# Patient Record
Sex: Female | Born: 1945 | Race: Black or African American | Hispanic: No | Marital: Married | Smoking: Former smoker
Health system: Southern US, Community
[De-identification: ages and names within clinical notes are randomized; demographics above are authoritative.]

## PROBLEM LIST (undated history)

## (undated) DIAGNOSIS — D219 Benign neoplasm of connective and other soft tissue, unspecified: Secondary | ICD-10-CM

## (undated) DIAGNOSIS — J45909 Unspecified asthma, uncomplicated: Secondary | ICD-10-CM

## (undated) DIAGNOSIS — G4733 Obstructive sleep apnea (adult) (pediatric): Secondary | ICD-10-CM

## (undated) DIAGNOSIS — IMO0002 Reserved for concepts with insufficient information to code with codable children: Secondary | ICD-10-CM

## (undated) DIAGNOSIS — K5790 Diverticulosis of intestine, part unspecified, without perforation or abscess without bleeding: Secondary | ICD-10-CM

## (undated) DIAGNOSIS — R519 Headache, unspecified: Secondary | ICD-10-CM

## (undated) DIAGNOSIS — N390 Urinary tract infection, site not specified: Secondary | ICD-10-CM

## (undated) DIAGNOSIS — R51 Headache: Secondary | ICD-10-CM

## (undated) DIAGNOSIS — R911 Solitary pulmonary nodule: Secondary | ICD-10-CM

## (undated) DIAGNOSIS — Z8619 Personal history of other infectious and parasitic diseases: Secondary | ICD-10-CM

## (undated) DIAGNOSIS — K469 Unspecified abdominal hernia without obstruction or gangrene: Secondary | ICD-10-CM

## (undated) DIAGNOSIS — H9190 Unspecified hearing loss, unspecified ear: Secondary | ICD-10-CM

## (undated) DIAGNOSIS — K7581 Nonalcoholic steatohepatitis (NASH): Secondary | ICD-10-CM

## (undated) DIAGNOSIS — K7689 Other specified diseases of liver: Secondary | ICD-10-CM

## (undated) DIAGNOSIS — M1990 Unspecified osteoarthritis, unspecified site: Secondary | ICD-10-CM

## (undated) DIAGNOSIS — I10 Essential (primary) hypertension: Secondary | ICD-10-CM

## (undated) DIAGNOSIS — E7800 Pure hypercholesterolemia, unspecified: Secondary | ICD-10-CM

## (undated) DIAGNOSIS — T4145XA Adverse effect of unspecified anesthetic, initial encounter: Secondary | ICD-10-CM

## (undated) DIAGNOSIS — T7840XA Allergy, unspecified, initial encounter: Secondary | ICD-10-CM

## (undated) DIAGNOSIS — R32 Unspecified urinary incontinence: Secondary | ICD-10-CM

## (undated) DIAGNOSIS — E119 Type 2 diabetes mellitus without complications: Secondary | ICD-10-CM

## (undated) DIAGNOSIS — R700 Elevated erythrocyte sedimentation rate: Secondary | ICD-10-CM

## (undated) DIAGNOSIS — E785 Hyperlipidemia, unspecified: Secondary | ICD-10-CM

## (undated) DIAGNOSIS — E042 Nontoxic multinodular goiter: Secondary | ICD-10-CM

## (undated) DIAGNOSIS — R739 Hyperglycemia, unspecified: Secondary | ICD-10-CM

## (undated) DIAGNOSIS — D649 Anemia, unspecified: Secondary | ICD-10-CM

## (undated) DIAGNOSIS — M549 Dorsalgia, unspecified: Secondary | ICD-10-CM

## (undated) DIAGNOSIS — R7303 Prediabetes: Secondary | ICD-10-CM

## (undated) DIAGNOSIS — I2510 Atherosclerotic heart disease of native coronary artery without angina pectoris: Secondary | ICD-10-CM

## (undated) DIAGNOSIS — R87619 Unspecified abnormal cytological findings in specimens from cervix uteri: Secondary | ICD-10-CM

## (undated) DIAGNOSIS — G43909 Migraine, unspecified, not intractable, without status migrainosus: Secondary | ICD-10-CM

## (undated) DIAGNOSIS — T8859XA Other complications of anesthesia, initial encounter: Secondary | ICD-10-CM

## (undated) DIAGNOSIS — Z780 Asymptomatic menopausal state: Secondary | ICD-10-CM

## (undated) DIAGNOSIS — K449 Diaphragmatic hernia without obstruction or gangrene: Secondary | ICD-10-CM

## (undated) DIAGNOSIS — J309 Allergic rhinitis, unspecified: Secondary | ICD-10-CM

## (undated) DIAGNOSIS — G4730 Sleep apnea, unspecified: Secondary | ICD-10-CM

## (undated) DIAGNOSIS — K219 Gastro-esophageal reflux disease without esophagitis: Secondary | ICD-10-CM

## (undated) DIAGNOSIS — Z9889 Other specified postprocedural states: Secondary | ICD-10-CM

## (undated) DIAGNOSIS — Z9989 Dependence on other enabling machines and devices: Secondary | ICD-10-CM

## (undated) DIAGNOSIS — G4734 Idiopathic sleep related nonobstructive alveolar hypoventilation: Secondary | ICD-10-CM

## (undated) DIAGNOSIS — I509 Heart failure, unspecified: Secondary | ICD-10-CM

## (undated) DIAGNOSIS — E669 Obesity, unspecified: Secondary | ICD-10-CM

## (undated) HISTORY — DX: Migraine, unspecified, not intractable, without status migrainosus: G43.909

## (undated) HISTORY — DX: Unspecified abdominal hernia without obstruction or gangrene: K46.9

## (undated) HISTORY — DX: Obstructive sleep apnea (adult) (pediatric): G47.33

## (undated) HISTORY — DX: Adverse effect of unspecified anesthetic, initial encounter: T41.45XA

## (undated) HISTORY — DX: Diverticulosis of intestine, part unspecified, without perforation or abscess without bleeding: K57.90

## (undated) HISTORY — DX: Hyperlipidemia, unspecified: E78.5

## (undated) HISTORY — DX: Obesity, unspecified: E66.9

## (undated) HISTORY — DX: Unspecified osteoarthritis, unspecified site: M19.90

## (undated) HISTORY — DX: Hyperglycemia, unspecified: R73.9

## (undated) HISTORY — DX: Solitary pulmonary nodule: R91.1

## (undated) HISTORY — DX: Essential (primary) hypertension: I10

## (undated) HISTORY — DX: Allergy, unspecified, initial encounter: T78.40XA

## (undated) HISTORY — DX: Sleep apnea, unspecified: G47.30

## (undated) HISTORY — DX: Obstructive sleep apnea (adult) (pediatric): Z99.89

## (undated) HISTORY — DX: Nonalcoholic steatohepatitis (NASH): K75.81

## (undated) HISTORY — DX: Reserved for concepts with insufficient information to code with codable children: IMO0002

## (undated) HISTORY — DX: Type 2 diabetes mellitus without complications: E11.9

## (undated) HISTORY — DX: Unspecified urinary incontinence: R32

## (undated) HISTORY — DX: Headache: R51

## (undated) HISTORY — DX: Other complications of anesthesia, initial encounter: T88.59XA

## (undated) HISTORY — DX: Unspecified abnormal cytological findings in specimens from cervix uteri: R87.619

## (undated) HISTORY — DX: Allergic rhinitis, unspecified: J30.9

## (undated) HISTORY — DX: Diaphragmatic hernia without obstruction or gangrene: K44.9

## (undated) HISTORY — DX: Other specified postprocedural states: Z98.890

## (undated) HISTORY — DX: Idiopathic sleep related nonobstructive alveolar hypoventilation: G47.34

## (undated) HISTORY — DX: Other specified diseases of liver: K76.89

## (undated) HISTORY — DX: Dorsalgia, unspecified: M54.9

## (undated) HISTORY — PX: FOOT SURGERY: SHX648

## (undated) HISTORY — PX: DILATION AND CURETTAGE OF UTERUS: SHX78

## (undated) HISTORY — DX: Benign neoplasm of connective and other soft tissue, unspecified: D21.9

## (undated) HISTORY — DX: Urinary tract infection, site not specified: N39.0

## (undated) HISTORY — DX: Asymptomatic menopausal state: Z78.0

## (undated) HISTORY — DX: Pure hypercholesterolemia, unspecified: E78.00

## (undated) HISTORY — DX: Headache, unspecified: R51.9

## (undated) HISTORY — DX: Personal history of other infectious and parasitic diseases: Z86.19

## (undated) HISTORY — PX: CATARACT EXTRACTION, BILATERAL: SHX1313

## (undated) HISTORY — DX: Nontoxic multinodular goiter: E04.2

## (undated) HISTORY — PX: OTHER SURGICAL HISTORY: SHX169

## (undated) HISTORY — DX: Atherosclerotic heart disease of native coronary artery without angina pectoris: I25.10

## (undated) HISTORY — DX: Elevated erythrocyte sedimentation rate: R70.0

## (undated) HISTORY — DX: Gastro-esophageal reflux disease without esophagitis: K21.9

## (undated) HISTORY — PX: WISDOM TOOTH EXTRACTION: SHX21

## (undated) HISTORY — DX: Anemia, unspecified: D64.9

## (undated) HISTORY — DX: Unspecified hearing loss, unspecified ear: H91.90

## (undated) LAB — HM DIABETES EYE EXAM

---

## 1982-03-02 HISTORY — PX: BREAST SURGERY: SHX581

## 2001-08-04 ENCOUNTER — Encounter: Admission: RE | Admit: 2001-08-04 | Discharge: 2001-08-04 | Payer: Self-pay | Admitting: Endocrinology

## 2001-08-04 ENCOUNTER — Encounter: Payer: Self-pay | Admitting: Endocrinology

## 2001-10-17 ENCOUNTER — Ambulatory Visit (HOSPITAL_COMMUNITY): Admission: RE | Admit: 2001-10-17 | Discharge: 2001-10-17 | Payer: Self-pay | Admitting: Endocrinology

## 2001-10-17 ENCOUNTER — Encounter: Payer: Self-pay | Admitting: Endocrinology

## 2001-10-17 ENCOUNTER — Encounter (INDEPENDENT_AMBULATORY_CARE_PROVIDER_SITE_OTHER): Payer: Self-pay | Admitting: *Deleted

## 2002-02-02 ENCOUNTER — Other Ambulatory Visit: Admission: RE | Admit: 2002-02-02 | Discharge: 2002-02-02 | Payer: Self-pay | Admitting: Obstetrics and Gynecology

## 2003-07-25 ENCOUNTER — Ambulatory Visit (HOSPITAL_COMMUNITY): Admission: RE | Admit: 2003-07-25 | Discharge: 2003-07-25 | Payer: Self-pay | Admitting: Family Medicine

## 2003-08-23 ENCOUNTER — Ambulatory Visit (HOSPITAL_COMMUNITY): Admission: RE | Admit: 2003-08-23 | Discharge: 2003-08-23 | Payer: Self-pay | Admitting: Endocrinology

## 2003-09-14 ENCOUNTER — Ambulatory Visit (HOSPITAL_COMMUNITY): Admission: RE | Admit: 2003-09-14 | Discharge: 2003-09-14 | Payer: Self-pay | Admitting: Endocrinology

## 2003-09-14 ENCOUNTER — Encounter (INDEPENDENT_AMBULATORY_CARE_PROVIDER_SITE_OTHER): Payer: Self-pay | Admitting: *Deleted

## 2004-03-26 ENCOUNTER — Other Ambulatory Visit: Admission: RE | Admit: 2004-03-26 | Discharge: 2004-03-26 | Payer: Self-pay | Admitting: Obstetrics and Gynecology

## 2004-08-18 ENCOUNTER — Ambulatory Visit: Payer: Self-pay | Admitting: Endocrinology

## 2004-08-25 ENCOUNTER — Ambulatory Visit: Payer: Self-pay | Admitting: Endocrinology

## 2004-08-29 ENCOUNTER — Ambulatory Visit (HOSPITAL_COMMUNITY): Admission: RE | Admit: 2004-08-29 | Discharge: 2004-08-29 | Payer: Self-pay | Admitting: Endocrinology

## 2005-01-21 ENCOUNTER — Ambulatory Visit: Payer: Self-pay | Admitting: Endocrinology

## 2005-01-23 ENCOUNTER — Ambulatory Visit: Payer: Self-pay | Admitting: Endocrinology

## 2005-01-27 ENCOUNTER — Ambulatory Visit: Payer: Self-pay | Admitting: Endocrinology

## 2005-02-24 ENCOUNTER — Ambulatory Visit: Payer: Self-pay | Admitting: Endocrinology

## 2005-03-17 ENCOUNTER — Ambulatory Visit: Payer: Self-pay | Admitting: Endocrinology

## 2005-04-22 ENCOUNTER — Other Ambulatory Visit: Admission: RE | Admit: 2005-04-22 | Discharge: 2005-04-22 | Payer: Self-pay | Admitting: Obstetrics and Gynecology

## 2005-04-28 ENCOUNTER — Ambulatory Visit: Payer: Self-pay | Admitting: Gastroenterology

## 2005-05-11 ENCOUNTER — Ambulatory Visit: Payer: Self-pay | Admitting: Gastroenterology

## 2005-05-11 ENCOUNTER — Encounter (INDEPENDENT_AMBULATORY_CARE_PROVIDER_SITE_OTHER): Payer: Self-pay | Admitting: Specialist

## 2005-05-11 LAB — HM COLONOSCOPY

## 2005-08-26 ENCOUNTER — Ambulatory Visit: Payer: Self-pay | Admitting: Endocrinology

## 2005-08-30 HISTORY — PX: OTHER SURGICAL HISTORY: SHX169

## 2005-08-31 ENCOUNTER — Ambulatory Visit: Payer: Self-pay | Admitting: Endocrinology

## 2005-09-03 ENCOUNTER — Ambulatory Visit: Payer: Self-pay | Admitting: Family Medicine

## 2005-10-11 ENCOUNTER — Emergency Department (HOSPITAL_COMMUNITY): Admission: EM | Admit: 2005-10-11 | Discharge: 2005-10-11 | Payer: Self-pay | Admitting: Emergency Medicine

## 2005-10-13 ENCOUNTER — Ambulatory Visit: Payer: Self-pay | Admitting: Endocrinology

## 2005-10-21 ENCOUNTER — Ambulatory Visit: Payer: Self-pay

## 2005-10-21 HISTORY — PX: OTHER SURGICAL HISTORY: SHX169

## 2005-11-17 ENCOUNTER — Ambulatory Visit: Payer: Self-pay | Admitting: Gastroenterology

## 2005-11-19 ENCOUNTER — Ambulatory Visit: Payer: Self-pay | Admitting: Gastroenterology

## 2005-12-08 ENCOUNTER — Encounter (INDEPENDENT_AMBULATORY_CARE_PROVIDER_SITE_OTHER): Payer: Self-pay | Admitting: *Deleted

## 2005-12-08 ENCOUNTER — Ambulatory Visit: Payer: Self-pay | Admitting: Gastroenterology

## 2005-12-08 HISTORY — PX: ESOPHAGOGASTRODUODENOSCOPY: SHX1529

## 2005-12-14 ENCOUNTER — Ambulatory Visit (HOSPITAL_COMMUNITY): Admission: RE | Admit: 2005-12-14 | Discharge: 2005-12-14 | Payer: Self-pay | Admitting: Gastroenterology

## 2006-06-03 ENCOUNTER — Ambulatory Visit: Payer: Self-pay | Admitting: Endocrinology

## 2006-10-06 ENCOUNTER — Ambulatory Visit: Payer: Self-pay | Admitting: Endocrinology

## 2006-10-06 LAB — CONVERTED CEMR LAB
ALT: 14 units/L (ref 0–35)
BUN: 11 mg/dL (ref 6–23)
CO2: 29 meq/L (ref 19–32)
Calcium: 9.2 mg/dL (ref 8.4–10.5)
Cholesterol: 163 mg/dL (ref 0–200)
Creatinine, Ser: 0.8 mg/dL (ref 0.4–1.2)
Eosinophils Absolute: 0.1 10*3/uL (ref 0.0–0.6)
GFR calc Af Amer: 94 mL/min
GFR calc non Af Amer: 78 mL/min
Glucose, Bld: 105 mg/dL — ABNORMAL HIGH (ref 70–99)
HDL: 50.5 mg/dL (ref >39.0)
Hemoglobin, Urine: NEGATIVE
Hgb A1c MFr Bld: 6.2 % — ABNORMAL HIGH (ref 4.6–6.0)
Ketones, ur: NEGATIVE mg/dL
MCHC: 33.4 g/dL (ref 30.0–36.0)
MCV: 90.6 fL (ref 78.0–100.0)
Monocytes Absolute: 0.4 10*3/uL (ref 0.2–0.7)
Monocytes Relative: 6.9 % (ref 3.0–11.0)
Neutro Abs: 3.2 10*3/uL (ref 1.4–7.7)
Potassium: 3.7 meq/L (ref 3.5–5.1)
RBC: 4.05 M/uL (ref 3.87–5.11)
RDW: 16 % — ABNORMAL HIGH (ref 11.5–14.6)
Sodium: 140 meq/L (ref 135–145)
Total CHOL/HDL Ratio: 3.2
Triglycerides: 64 mg/dL (ref 0–149)
Urobilinogen, UA: 0.2 (ref 0.0–1.0)
VLDL: 13 mg/dL (ref 0–40)
WBC: 5.2 10*3/uL (ref 4.5–10.5)
pH: 8 (ref 5.0–8.0)

## 2006-10-15 ENCOUNTER — Ambulatory Visit: Payer: Self-pay | Admitting: Endocrinology

## 2006-10-15 HISTORY — PX: ELECTROCARDIOGRAM: SHX264

## 2006-10-22 ENCOUNTER — Encounter: Payer: Self-pay | Admitting: Endocrinology

## 2006-10-22 DIAGNOSIS — M1990 Unspecified osteoarthritis, unspecified site: Secondary | ICD-10-CM

## 2006-10-22 DIAGNOSIS — F411 Generalized anxiety disorder: Secondary | ICD-10-CM | POA: Insufficient documentation

## 2006-10-22 DIAGNOSIS — J309 Allergic rhinitis, unspecified: Secondary | ICD-10-CM

## 2006-10-22 DIAGNOSIS — I10 Essential (primary) hypertension: Secondary | ICD-10-CM | POA: Insufficient documentation

## 2006-10-22 DIAGNOSIS — E1159 Type 2 diabetes mellitus with other circulatory complications: Secondary | ICD-10-CM | POA: Insufficient documentation

## 2006-10-22 HISTORY — DX: Essential (primary) hypertension: I10

## 2006-10-22 HISTORY — DX: Allergic rhinitis, unspecified: J30.9

## 2006-10-22 HISTORY — DX: Unspecified osteoarthritis, unspecified site: M19.90

## 2007-01-13 ENCOUNTER — Ambulatory Visit: Payer: Self-pay | Admitting: Endocrinology

## 2007-01-13 DIAGNOSIS — E119 Type 2 diabetes mellitus without complications: Secondary | ICD-10-CM

## 2007-01-13 DIAGNOSIS — E7800 Pure hypercholesterolemia, unspecified: Secondary | ICD-10-CM

## 2007-01-13 HISTORY — DX: Pure hypercholesterolemia, unspecified: E78.00

## 2007-01-13 HISTORY — DX: Type 2 diabetes mellitus without complications: E11.9

## 2007-01-13 LAB — CONVERTED CEMR LAB
ALT: 15 units/L (ref 0–35)
AST: 17 units/L (ref 0–37)
Albumin: 3.3 g/dL — ABNORMAL LOW (ref 3.5–5.2)
Bilirubin, Direct: 0.1 mg/dL (ref 0.0–0.3)
LDL Cholesterol: 77 mg/dL (ref 0–99)
Total Bilirubin: 0.5 mg/dL (ref 0.3–1.2)
Total CHOL/HDL Ratio: 2.8
Total Protein: 7.6 g/dL (ref 6.0–8.3)

## 2007-01-21 ENCOUNTER — Ambulatory Visit: Payer: Self-pay | Admitting: Endocrinology

## 2007-04-21 ENCOUNTER — Ambulatory Visit: Payer: Self-pay | Admitting: Endocrinology

## 2007-07-04 ENCOUNTER — Telehealth (INDEPENDENT_AMBULATORY_CARE_PROVIDER_SITE_OTHER): Payer: Self-pay | Admitting: *Deleted

## 2007-07-20 ENCOUNTER — Ambulatory Visit: Payer: Self-pay | Admitting: Endocrinology

## 2007-07-20 DIAGNOSIS — R51 Headache: Secondary | ICD-10-CM

## 2007-07-20 DIAGNOSIS — E042 Nontoxic multinodular goiter: Secondary | ICD-10-CM

## 2007-07-20 DIAGNOSIS — K7581 Nonalcoholic steatohepatitis (NASH): Secondary | ICD-10-CM

## 2007-07-20 DIAGNOSIS — K219 Gastro-esophageal reflux disease without esophagitis: Secondary | ICD-10-CM

## 2007-07-20 DIAGNOSIS — R519 Headache, unspecified: Secondary | ICD-10-CM | POA: Insufficient documentation

## 2007-07-20 DIAGNOSIS — K7689 Other specified diseases of liver: Secondary | ICD-10-CM

## 2007-07-20 HISTORY — DX: Other specified diseases of liver: K76.89

## 2007-07-20 HISTORY — DX: Headache: R51

## 2007-07-20 HISTORY — DX: Gastro-esophageal reflux disease without esophagitis: K21.9

## 2007-07-20 HISTORY — DX: Nontoxic multinodular goiter: E04.2

## 2007-07-20 LAB — CONVERTED CEMR LAB: Hgb A1c MFr Bld: 6.6 % — ABNORMAL HIGH (ref 4.6–6.0)

## 2007-08-03 ENCOUNTER — Encounter (INDEPENDENT_AMBULATORY_CARE_PROVIDER_SITE_OTHER): Payer: Self-pay | Admitting: *Deleted

## 2007-08-19 ENCOUNTER — Encounter: Admission: RE | Admit: 2007-08-19 | Discharge: 2007-08-19 | Payer: Self-pay | Admitting: Endocrinology

## 2007-09-21 ENCOUNTER — Encounter: Payer: Self-pay | Admitting: Endocrinology

## 2007-11-11 ENCOUNTER — Ambulatory Visit: Payer: Self-pay | Admitting: Endocrinology

## 2007-11-12 LAB — CONVERTED CEMR LAB
ALT: 11 units/L (ref 0–35)
AST: 17 units/L (ref 0–37)
Albumin: 3.6 g/dL (ref 3.5–5.2)
Alkaline Phosphatase: 58 units/L (ref 39–117)
BUN: 14 mg/dL (ref 6–23)
Basophils Absolute: 0 10*3/uL (ref 0.0–0.1)
Bilirubin Urine: NEGATIVE
CO2: 29 meq/L (ref 19–32)
Chloride: 107 meq/L (ref 96–112)
Creatinine, Ser: 0.8 mg/dL (ref 0.4–1.2)
Eosinophils Absolute: 0.1 10*3/uL (ref 0.0–0.7)
GFR calc Af Amer: 94 mL/min
HCT: 38.4 % (ref 36.0–46.0)
HDL: 55.9 mg/dL (ref >39.0)
Hemoglobin: 12.8 g/dL (ref 12.0–15.0)
Hgb A1c MFr Bld: 6.3 % — ABNORMAL HIGH (ref 4.6–6.0)
Leukocytes, UA: NEGATIVE
Lymphocytes Relative: 33.5 % (ref 12.0–46.0)
Monocytes Absolute: 0.4 10*3/uL (ref 0.1–1.0)
Neutro Abs: 2.7 10*3/uL (ref 1.4–7.7)
Potassium: 3.8 meq/L (ref 3.5–5.1)
RBC: 4.25 M/uL (ref 3.87–5.11)
Total Protein: 7.8 g/dL (ref 6.0–8.3)
Triglycerides: 50 mg/dL (ref 0–149)
Urine Glucose: NEGATIVE mg/dL
Urobilinogen, UA: 0.2 (ref 0.0–1.0)
VLDL: 10 mg/dL (ref 0–40)
WBC: 4.8 10*3/uL (ref 4.5–10.5)
pH: 7 (ref 5.0–8.0)

## 2007-11-22 ENCOUNTER — Ambulatory Visit: Payer: Self-pay | Admitting: Endocrinology

## 2007-11-22 DIAGNOSIS — H9319 Tinnitus, unspecified ear: Secondary | ICD-10-CM | POA: Insufficient documentation

## 2007-11-22 DIAGNOSIS — H9190 Unspecified hearing loss, unspecified ear: Secondary | ICD-10-CM

## 2007-11-22 DIAGNOSIS — Z780 Asymptomatic menopausal state: Secondary | ICD-10-CM

## 2007-11-22 DIAGNOSIS — G8929 Other chronic pain: Secondary | ICD-10-CM | POA: Insufficient documentation

## 2007-11-22 DIAGNOSIS — M25569 Pain in unspecified knee: Secondary | ICD-10-CM

## 2007-11-22 HISTORY — DX: Asymptomatic menopausal state: Z78.0

## 2007-11-22 HISTORY — DX: Unspecified hearing loss, unspecified ear: H91.90

## 2007-11-29 ENCOUNTER — Encounter: Payer: Self-pay | Admitting: Endocrinology

## 2007-11-29 ENCOUNTER — Ambulatory Visit: Payer: Self-pay | Admitting: Family Medicine

## 2007-12-13 ENCOUNTER — Encounter: Payer: Self-pay | Admitting: Endocrinology

## 2007-12-15 ENCOUNTER — Encounter: Payer: Self-pay | Admitting: Endocrinology

## 2007-12-21 ENCOUNTER — Encounter: Payer: Self-pay | Admitting: Endocrinology

## 2008-03-02 DIAGNOSIS — T8859XA Other complications of anesthesia, initial encounter: Secondary | ICD-10-CM

## 2008-03-02 HISTORY — DX: Other complications of anesthesia, initial encounter: T88.59XA

## 2008-03-13 ENCOUNTER — Encounter: Payer: Self-pay | Admitting: Endocrinology

## 2008-06-28 ENCOUNTER — Encounter: Payer: Self-pay | Admitting: Endocrinology

## 2008-09-04 ENCOUNTER — Ambulatory Visit: Payer: Self-pay | Admitting: Endocrinology

## 2008-09-04 LAB — CONVERTED CEMR LAB
Cholesterol, target level: 200 mg/dL
HDL goal, serum: 40 mg/dL
TSH: 1.18 microintl units/mL (ref 0.35–5.50)

## 2008-12-04 ENCOUNTER — Ambulatory Visit: Payer: Self-pay | Admitting: Endocrinology

## 2008-12-04 LAB — CONVERTED CEMR LAB
ALT: 18 units/L (ref 0–35)
AST: 18 units/L (ref 0–37)
Basophils Absolute: 0 10*3/uL (ref 0.0–0.1)
Bilirubin, Direct: 0.1 mg/dL (ref 0.0–0.3)
Calcium: 8.8 mg/dL (ref 8.4–10.5)
Cholesterol: 165 mg/dL (ref 0–200)
Creatinine, Ser: 0.8 mg/dL (ref 0.4–1.2)
GFR calc non Af Amer: 93.44 mL/min (ref >60)
Glucose, Bld: 94 mg/dL (ref 70–99)
HDL: 53.7 mg/dL (ref >39.00)
Hemoglobin: 12.6 g/dL (ref 12.0–15.0)
Ketones, ur: NEGATIVE mg/dL
MCHC: 33.1 g/dL (ref 30.0–36.0)
MCV: 87.8 fL (ref 78.0–100.0)
Monocytes Absolute: 0.4 10*3/uL (ref 0.1–1.0)
Monocytes Relative: 7.2 % (ref 3.0–12.0)
Nitrite: NEGATIVE
RBC: 4.32 M/uL (ref 3.87–5.11)
RDW: 16 % — ABNORMAL HIGH (ref 11.5–14.6)
Sodium: 141 meq/L (ref 135–145)
Total CHOL/HDL Ratio: 3
Total Protein, Urine: NEGATIVE mg/dL
Total Protein: 7.5 g/dL (ref 6.0–8.3)
Urine Glucose: NEGATIVE mg/dL
VLDL: 14.8 mg/dL (ref 0.0–40.0)

## 2008-12-18 ENCOUNTER — Ambulatory Visit: Payer: Self-pay | Admitting: Endocrinology

## 2008-12-18 DIAGNOSIS — D649 Anemia, unspecified: Secondary | ICD-10-CM

## 2008-12-18 DIAGNOSIS — R609 Edema, unspecified: Secondary | ICD-10-CM | POA: Insufficient documentation

## 2008-12-18 HISTORY — DX: Anemia, unspecified: D64.9

## 2009-03-14 ENCOUNTER — Telehealth (INDEPENDENT_AMBULATORY_CARE_PROVIDER_SITE_OTHER): Payer: Self-pay | Admitting: *Deleted

## 2009-03-22 ENCOUNTER — Encounter: Payer: Self-pay | Admitting: Endocrinology

## 2009-07-02 ENCOUNTER — Telehealth: Payer: Self-pay | Admitting: Endocrinology

## 2009-07-04 ENCOUNTER — Ambulatory Visit: Payer: Self-pay | Admitting: Endocrinology

## 2009-07-05 LAB — CONVERTED CEMR LAB
Eosinophils Absolute: 0.1 10*3/uL (ref 0.0–0.7)
Eosinophils Relative: 2.6 % (ref 0.0–5.0)
HCT: 37.9 % (ref 36.0–46.0)
Hemoglobin: 12.7 g/dL (ref 12.0–15.0)
Lymphocytes Relative: 27 % (ref 12.0–46.0)
MCHC: 33.4 g/dL (ref 30.0–36.0)
MCV: 87.5 fL (ref 78.0–100.0)
Monocytes Relative: 8.2 % (ref 3.0–12.0)
Platelets: 202 10*3/uL (ref 150.0–400.0)
RBC: 4.33 M/uL (ref 3.87–5.11)
RDW: 17 % — ABNORMAL HIGH (ref 11.5–14.6)
Saturation Ratios: 12.7 % — ABNORMAL LOW (ref 20.0–50.0)

## 2009-07-23 ENCOUNTER — Encounter: Payer: Self-pay | Admitting: Endocrinology

## 2009-07-30 ENCOUNTER — Encounter: Payer: Self-pay | Admitting: Endocrinology

## 2009-07-31 ENCOUNTER — Encounter (INDEPENDENT_AMBULATORY_CARE_PROVIDER_SITE_OTHER): Payer: Self-pay | Admitting: *Deleted

## 2009-09-04 ENCOUNTER — Telehealth: Payer: Self-pay | Admitting: Endocrinology

## 2009-09-12 ENCOUNTER — Encounter: Payer: Self-pay | Admitting: Endocrinology

## 2009-12-03 ENCOUNTER — Ambulatory Visit: Payer: Self-pay | Admitting: Endocrinology

## 2009-12-03 DIAGNOSIS — M542 Cervicalgia: Secondary | ICD-10-CM | POA: Insufficient documentation

## 2009-12-13 ENCOUNTER — Ambulatory Visit: Payer: Self-pay | Admitting: Endocrinology

## 2009-12-13 LAB — CONVERTED CEMR LAB
ALT: 12 units/L (ref 0–35)
Albumin: 3.6 g/dL (ref 3.5–5.2)
BUN: 15 mg/dL (ref 6–23)
Basophils Absolute: 0 10*3/uL (ref 0.0–0.1)
Bilirubin Urine: NEGATIVE
Bilirubin, Direct: 0.1 mg/dL (ref 0.0–0.3)
CO2: 29 meq/L (ref 19–32)
Creatinine, Ser: 0.8 mg/dL (ref 0.4–1.2)
Eosinophils Absolute: 0.1 10*3/uL (ref 0.0–0.7)
Eosinophils Relative: 2 % (ref 0.0–5.0)
Glucose, Bld: 90 mg/dL (ref 70–99)
HDL: 61 mg/dL (ref >39.00)
Hemoglobin: 12.4 g/dL (ref 12.0–15.0)
Hgb A1c MFr Bld: 6.5 % (ref 4.6–6.5)
Ketones, ur: NEGATIVE mg/dL
Leukocytes, UA: NEGATIVE
Lymphocytes Relative: 30.6 % (ref 12.0–46.0)
MCV: 90.6 fL (ref 78.0–100.0)
Monocytes Relative: 9.7 % (ref 3.0–12.0)
Neutro Abs: 2.8 10*3/uL (ref 1.4–7.7)
Neutrophils Relative %: 57.2 % (ref 43.0–77.0)
Nitrite: NEGATIVE
Potassium: 4.3 meq/L (ref 3.5–5.1)
Sodium: 142 meq/L (ref 135–145)
Total CHOL/HDL Ratio: 3
Total Protein, Urine: NEGATIVE mg/dL
Triglycerides: 57 mg/dL (ref 0.0–149.0)
Urobilinogen, UA: 0.2 (ref 0.0–1.0)
VLDL: 11.4 mg/dL (ref 0.0–40.0)

## 2009-12-19 ENCOUNTER — Ambulatory Visit: Payer: Self-pay | Admitting: Endocrinology

## 2009-12-19 LAB — CONVERTED CEMR LAB: Iron: 46 ug/dL (ref 42–145)

## 2009-12-24 ENCOUNTER — Encounter: Payer: Self-pay | Admitting: Endocrinology

## 2009-12-24 ENCOUNTER — Ambulatory Visit: Payer: Self-pay | Admitting: Internal Medicine

## 2010-03-30 LAB — CONVERTED CEMR LAB
Hgb A1c MFr Bld: 6.6 % — ABNORMAL HIGH (ref 4.6–6.5)
Microalb Creat Ratio: 3.7 mg/g (ref 0.0–30.0)
Microalb, Ur: 0.2 mg/dL (ref 0.0–1.9)
Saturation Ratios: 16 % — ABNORMAL LOW (ref 20.0–50.0)

## 2010-03-31 ENCOUNTER — Encounter: Payer: Self-pay | Admitting: Endocrinology

## 2010-04-01 NOTE — Letter (Signed)
Summary: [facility]  [facility]   Imported By: Rise Patience 08/06/2009 14:16:37  _____________________________________________________________________  External Attachment:    Type:   Image     Comment:   External Document

## 2010-04-01 NOTE — Progress Notes (Signed)
Summary: Labs?  Phone Note Call from Patient Call back at Home Phone [phone]   Caller: Patient Summary of Call: pt called requesting labs be scheduled - A1C Initial call taken by: Crissie Sickles, [place],  Jul 02, 2009 9:44 AM  Follow-up for Phone Call        a1c 250.00  iron panel 285.9, and cbc 285.9 Follow-up by: Donavan Foil MD,  Jul 02, 2009 12:53 PM  Additional Follow-up for Phone Call Additional follow up Details #1::        labs scheduled for 05/05. pt informed Additional Follow-up by: Crissie Sickles, [place],  Jul 02, 2009 2:07 PM

## 2010-04-01 NOTE — Miscellaneous (Signed)
Summary: [facility] results   Clinical Lists Changes  Observations: Added new observation of MAMMOGRAM: negative (07/30/2009 8:39)      Preventive Care Screening  Mammogram:    Date:  07/30/2009    Results:  negative

## 2010-04-01 NOTE — Letter (Signed)
Summary: [facility]  [facility]   Imported By: Bubba Hales 04/01/2009 10:48:49  _____________________________________________________________________  External Attachment:    Type:   Image     Comment:   External Document

## 2010-04-01 NOTE — Progress Notes (Signed)
Summary: [facility]  Phone Note Other Incoming   Summary of Call: Received a fax from [facility] questioning if pt has had 2 A1C tests (12/18/08 and 09/04/08), LDL test (12/04/08), and a Triglyceride test (12/04/08) in the past year? I faxed the information back to them at [phone] Initial call taken by: Gardenia Phlegm CMA,  March 14, 2009 9:17 AM

## 2010-04-01 NOTE — Progress Notes (Signed)
Summary: Rx refill req  Phone Note Call from Patient Call back at Home Phone [phone]   Caller: Patient Summary of Call: Pt called stating that Allegra D is now OTC, however she will still need Rx for this in order to get medication at her current strength and quantity. Initial call taken by: Crissie Sickles, CMA,  September 04, 2009 10:12 AM    Prescriptions: ALLEGRA-D 24 HOUR 180-240 MG TB24 (FEXOFENADINE-PSEUDOEPHEDRINE) 1 tablet by mouth once a day  #30 x 3   Entered by:   Crissie Sickles, CMA   Authorized by:   Donavan Foil MD   Signed by:   Crissie Sickles, CMA on 09/04/2009   Method used:   [facility]        [facility] [address] [phone]* (retail)       [address]       [place], [place]  [address]       Ph: [phone] or [phone]       Fax: [phone]   RxID:   [id number]

## 2010-04-01 NOTE — Assessment & Plan Note (Signed)
Summary: cpx Heather [name]  #   Vital Signs:  Patient profile:   65 year old female Height:      64 inches (162.56 cm) Weight:      255.50 pounds (116.14 kg) BMI:     44.02 O2 Sat:      91 % on Room air Temp:     97.6 degrees F (36.44 degrees C) oral Pulse rate:   83 / minute BP sitting:   126 / 72  (left arm) Cuff size:   large  Vitals Entered By: Rebeca Alert MA (December 19, 2009 1:35 PM)  O2 Flow:  Room air CC: Physical/aj Is Patient Diabetic? Yes   CC:  Physical/aj.  History of Present Illness: pt takes fe 1/day, despite not being anemic. here for regular wellness examination.  He's feeling pretty well in general, and does not drink or smoke.  Current Medications (verified): 1)  Multivitamins   Tabs (Multiple Vitamin) .... Take 1 By Mouth Qd 2)  Iron Supplement 325 (65 Fe) Mg  Tabs (Ferrous Sulfate) .... Take 1 By Mouth Qd 3)  Topamax 100 Mg  Tabs (Topiramate) .... Take 1 By Mouth Three Times A Day 4)  Klor-Con M20 20 Meq  Tbcr (Potassium Chloride Crys Cr) .... Take 1 By Mouth Qd 5)  Crestor 40 Mg  Tabs (Rosuvastatin Calcium) .... Take 1 By Mouth Qd 6)  Allegra-D 24 Hour 180-240 Mg Tb24 (Fexofenadine-Pseudoephedrine) .[name].. 1 Tablet By Mouth Once A Day 7)  Vicodin 5-500 Mg Tabs (Hydrocodone-Acetaminophen) .[name].. 1 Q4h As Needed Pain 8)  Metformin Hcl 1000 Mg Tabs (Metformin Hcl) .... Qd 9)  Lasix 20 Mg Tabs (Furosemide) .[name].. 1 Daily 10)  Calcium 600 + D .... 2 Daily 11)  Clonazepam 0.5 Mg Tabs (Clonazepam) .[name].. 1 By Mouth At Bedtime As Needed Sleep--Dr Doy Mince 12)  Glucosamine-Chondroitin  Caps (Glucosamine-Chondroit-Vit C-Mn) .[name].. 1 By Mouth Two Times A Day  Allergies (verified): 1)  ! Penicillin 2)  ! * Asprin 3)  ! Zestril 4)  Lisinopril (Lisinopril) 5)  Penicillin G Potassium (Penicillin G Potassium)  Family History: Reviewed history from 11/22/2007 and no changes required. mother had breast cancer and colon cancer  Social History: Reviewed history from 11/22/2007  and no changes required. married retired  Review of Systems  The patient denies fever, weight gain, vision loss, decreased hearing, syncope, dyspnea on exertion, prolonged cough, headaches, abdominal pain, melena, hematochezia, hematuria, suspicious skin lesions, and depression.         she has intermittent "sinus headache"  Physical Exam  General:  morbidly obese.  no distress  Head:  head: no deformity eyes: no periorbital swelling, no proptosis external nose and ears are normal mouth: no lesion seen Neck:  multinodular goiter, left > right Breasts:  sees gyn  Lungs:  Clear to auscultation bilaterally. Normal respiratory effort.  Heart:  Regular rate and rhythm without murmurs or gallops noted. Normal S1,S2.   Abdomen:  abdomen is soft, nontender.  no hepatosplenomegaly.   not distended.  no hernia  Rectal:  sees gyn  Genitalia:  sees gyn  Msk:  muscle bulk and strength are grossly normal.  no obvious joint swelling.  gait is normal and steady  Neurologic:  cn 2-12 grossly intact.   readily moves all 4's.   sensation is intact to touch on the feet  Skin:  normal texture and temp.  no rash is seen.  not diaphoretic  Cervical Nodes:  No significant adenopathy.  Psych:  Alert and  cooperative; normal mood and affect; normal attention span and concentration.   Additional Exam:  SEPARATE EVALUATION FOLLOWS--EACH PROBLEM HERE IS NEW, NOT RESPONDING TO TREATMENT, OR POSES SIGNIFICANT RISK TO THE PATIENT'S HEALTH: HISTORY OF THE PRESENT ILLNESS: the status of at least 3 ongoing medical problems is addressed today: dm: denies doe dyslipidemia: denies chest pain gerd: husband says worse recently neck pain: persists PAST MEDICAL HISTORY reviewed and up to date today REVIEW OF SYSTEMS: denies dysphagia.  she has lost a few lbs, due to her efforts PHYSICAL EXAMINATION: no deformity.  no ulcer on the feet.  feet are of normal color and temp.  no edema dorsalis pedis intact  bilat.  no carotid bruit abdomen is soft, nontender.  no hepatosplenomegaly.   not distended.  no hernia LAB/XRAY RESULTS: Hemoglobin A1C      6.5 %   LDL Cholesterol      [H]  100 mg/dL   IMPRESSION: neck pain, not better gerd, worse dm.  she could safely drive T6A lower dyslipidemia:  needs increased rx PLAN: see instruction sheet   Impression & Recommendations:  Problem # 1:  ROUTINE GENERAL MEDICAL EXAM@HEALTH  CARE FACL (ICD-V70.0)  Medications Added to Medication List This Visit: 1)  Welchol 625 Mg Tabs (Colesevelam hcl) .... 6 tabs once daily 2)  Tramadol-acetaminophen 37.5-325 Mg Tabs (Tramadol-acetaminophen) .[name].. 1 every 4 hrs as needed for pain  Other Orders: T-Bone Densitometry (26333) TLB-IBC Pnl (Iron/FE;Transferrin) (83550-IBC) Est. Patient Level IV (54562) Est. Patient 40-64 years ([phone])  Preventive Care Screening     gyn is dr Leo Grosser   Patient Instructions: 1)  add "welchol," to help the cholesterol and sugar.   2)  you should consider having the stomach checked by a specialist to see if there is a bad underlying cause of the heartburn. 3)  please consider these measures for your health:  minimize alcohol.  do not use tobacco products.  have a colonoscopy at least every 10 years from age 6.  keep firearms safely stored.  always use seat belts.  have working smoke alarms in your home.  see an eye doctor and dentist regularly.  never drive under the influence of alcohol or drugs (including prescription drugs).  4)  Please schedule a follow-up appointment in 6 months. 5)  check bone density.  here is a form to schedule. 6)  tramadol/acetaminophen, 1 every 4 hrs as needed for pain Prescriptions: TRAMADOL-ACETAMINOPHEN 37.5-325 MG TABS (TRAMADOL-ACETAMINOPHEN) 1 every 4 hrs as needed for pain  #50 x 2   Entered and Authorized by:   Donavan Foil MD   Signed by:   Donavan Foil MD on 12/19/2009   Method used:   [facility]        [facility]  [address] [phone]* (retail)       [place] [address]       [place], [place]  [address]       Ph: [phone] or [phone]       Fax: [phone]   RxID:   [id number] LASIX 20 MG TABS (FUROSEMIDE) 1 daily  #90 x 3   Entered and Authorized by:   Donavan Foil MD   Signed by:   Donavan Foil MD on 12/19/2009   Method used:   Print then Give to Patient   RxID:   [id number] METFORMIN HCL 1000 MG TABS (METFORMIN HCL) qd  #90 x 3   Entered and Authorized by:  Donavan Foil MD   Signed by:   Donavan Foil MD on 12/19/2009   Method used:   Print then Give to Patient   RxID:   [id number] CRESTOR 40 MG  TABS (ROSUVASTATIN CALCIUM) take 1 by mouth qd  #90 x 3   Entered and Authorized by:   Donavan Foil MD   Signed by:   Donavan Foil MD on 12/19/2009   Method used:   Print then Give to Patient   RxID:   [id number] KLOR-CON M20 20 MEQ  TBCR (POTASSIUM CHLORIDE CRYS CR) take 1 by mouth qd  #90 x 3   Entered and Authorized by:   Donavan Foil MD   Signed by:   Donavan Foil MD on 12/19/2009   Method used:   Print then Give to Patient   RxID:   [id number] [place] 625 MG TABS (COLESEVELAM HCL) 6 tabs once daily  #540 x 3   Entered and Authorized by:   Donavan Foil MD   Signed by:   Donavan Foil MD on 12/19/2009   Method used:   Electronically to        [facility] (mail-order)       [place]  [address]       Ph: [phone]       Fax: [phone]   RxID:   [id number] ALLEGRA-D 24 HOUR 180-240 MG TB24 (FEXOFENADINE-PSEUDOEPHEDRINE) 1 tablet by mouth once a day  #30 x 11   Entered and Authorized by:   Donavan Foil MD   Signed by:   Donavan Foil MD on 12/19/2009   Method used:   Print then Give to Patient   RxID:   [id number]    Orders Added: 1)  T-Bone Densitometry [77080] 2)  TLB-IBC Pnl (Iron/FE;Transferrin) [83550-IBC] 3)  Est. Patient Level IV [55208] 4)  Est. Patient  40-64 years [02233]     Preventive Care Screening     gyn is dr Leo Grosser

## 2010-04-01 NOTE — Assessment & Plan Note (Signed)
Summary: stiff neck/cd   Vital Signs:  Patient profile:   65 year old female Height:      64 inches Weight:      252 pounds BMI:     43.41 O2 Sat:      98 % on Room air Temp:     97.1 degrees F oral Pulse rate:   79 / minute BP sitting:   106 / 72  (left arm) Cuff size:   large  Vitals Entered By: Crissie Sickles, CMA (December 03, 2009 1:11 PM)  O2 Flow:  Room air CC: Stiff neck x 6 months increasing in pain last 2-3 weeks   CC:  Stiff neck x 6 months increasing in pain last 2-3 weeks.  History of Present Illness: pt states 6-8 weeks of pain at the right posterolat neck.  there is assoc "noise" in the context of rom of the neck.  vicodin helps.  no injury.    Current Medications (verified): 1)  Multivitamins   Tabs (Multiple Vitamin) .... Take 1 By Mouth Qd 2)  Iron Supplement 325 (65 Fe) Mg  Tabs (Ferrous Sulfate) .... Take 1 By Mouth Qd 3)  Topamax 100 Mg  Tabs (Topiramate) .... Take 1 By Mouth Three Times A Day 4)  Klor-Con M20 20 Meq  Tbcr (Potassium Chloride Crys Cr) .... Take 1 By Mouth Qd 5)  Crestor 40 Mg  Tabs (Rosuvastatin Calcium) .... Take 1 By Mouth Qd 6)  Allegra-D 24 Hour 180-240 Mg Tb24 (Fexofenadine-Pseudoephedrine) .[name].. 1 Tablet By Mouth Once A Day 7)  Vicodin 5-500 Mg Tabs (Hydrocodone-Acetaminophen) .[name].. 1 Q4h As Needed Pain 8)  Metformin Hcl 1000 Mg Tabs (Metformin Hcl) .... Qd 9)  Lasix 20 Mg Tabs (Furosemide) .[name].. 1 Daily 10)  Calcium 600 + D .... 2 Daily 11)  Clonazepam 0.5 Mg Tabs (Clonazepam) .[name].. 1 By Mouth At Bedtime As Needed Sleep 12)  Glucosamine-Chondroitin  Caps (Glucosamine-Chondroit-Vit C-Mn) .[name].. 1 By Mouth Two Times A Day  Allergies (verified): 1)  ! Penicillin 2)  ! * Asprin 3)  ! Zestril 4)  Lisinopril (Lisinopril) 5)  Penicillin G Potassium (Penicillin G Potassium)  Past History:  Past Medical History: Last updated: 10/22/2006 Allergic  rhinitis Anxiety Hypertension Osteoarthritis Menopause Dyslipidemia Multinodular Goiter Hyperglycemia Headache NASH  Review of Systems       no numbness  Physical Exam  General:  obese.  no distress  Neck:  large multinodular goiter, right > left full rom, but twisting to the right is painful Msk:  strength is normal throughout the ue's Neurologic:  sensation is intact to touch on the ue's Additional Exam:  CERVICAL SPINE - 2-3 VIEW Normal alignment and no acute bony findings. Degenerative disc disease at C5-6 and C6-7 along with mild facet degenerative changes.   Impression & Recommendations:  Problem # 1:  NECK PAIN (ICD-723.1) Assessment New strain vs oa  Medications Added to Medication List This Visit: 1)  Clonazepam 0.5 Mg Tabs (Clonazepam) .[name].. 1 by mouth at bedtime as needed sleep--dr reynolds 2)  Glucosamine-chondroitin Caps (Glucosamine-chondroit-vit c-mn) .[name].. 1 by mouth two times a day  Other Orders: Admin 1st Vaccine (23536) Flu Vaccine 62yr + ((14431 TLB-Lipid Panel (80061-LIPID) TLB-BMP (Basic Metabolic Panel-BMET) (854008-QPYPPJK TLB-CBC Platelet - w/Differential (85025-CBCD) TLB-Hepatic/Liver Function Pnl (80076-HEPATIC) TLB-TSH (Thyroid Stimulating Hormone) (84443-TSH) TLB-IBC Pnl (Iron/FE;Transferrin) (83550-IBC) TLB-B12, Serum-Total ONLY (82607-B12) TLB-A1C / Hgb A1C (Glycohemoglobin) (83036-A1C) TLB-Microalbumin/Creat Ratio, Urine (82043-MALB) TLB-Udip w/ Micro (81001-URINE) T-Cervicle Spine 2-3 Views (72040TC) Est. Patient Level III (  99213) Flu Vaccine Consent Questions     Do you have a history of severe allergic reactions to this vaccine? no    Any prior history of allergic reactions to egg and/or gelatin? no    Do you have a sensitivity to the preservative Thimersol? no    Do you have a past history of Guillan-Barre Syndrome? no    Do you currently have an acute febrile illness? no    Have you ever had a severe reaction to latex?  no    Vaccine information given and explained to patient? yes    Are you currently pregnant? no    Lot Number:[id number]   Exp Date:08/30/2010   Site Given  Left Deltoid IM1st Vaccine (83437) Flu Vaccine 29yr + ((35789  Patient Instructions: 1)  blood tests and a neck x ray are being ordered for you today.  please call [phone] hear your test results. 2)  Please schedule a physical appointment soon. .Marland Kitchenbflu

## 2010-04-01 NOTE — Letter (Signed)
Summary: [facility]  [facility]   Imported By: Bubba Hales 09/16/2009 10:52:29  _____________________________________________________________________  External Attachment:    Type:   Image     Comment:   External Document

## 2010-04-01 NOTE — Miscellaneous (Signed)
Summary: BONE DENSITY  Clinical Lists Changes  Orders: Added new Test order of T-Lumbar Vertebral Assessment (77082) - Signed 

## 2010-04-17 NOTE — Letter (Signed)
Summary: [facility]  [facility]   Imported By: Phillis Knack 04/07/2010 14:28:34  _____________________________________________________________________  External Attachment:    Type:   Image     Comment:   External Document

## 2010-07-18 NOTE — Assessment & Plan Note (Signed)
[facility]                           GASTROENTEROLOGY OFFICE NOTE   TERA, PELLICANE                     MRN:          [id number]  DATE:11/17/2005                            DOB:          [birth year]    GI CONSULTATION:  Heather [name] is a 65 year old black female referred through the courtesy of  Dr. Loanne Drilling for evaluation of chest pain, which led to emergency room visit  on October 12, 2005.   Ms. Woodroof had the sudden onset of substernal pain radiating to her neck  and her left arm of a rather severe nature, lasting several hours.  She  presented to [facility] emergency room and had a negative cardiac workup and  subsequently had a negative nuclear cardiology study at [facility]  on October 21, 2005.  Her chest x-ray showed a large hiatal hernia.  She was  placed on Protonix, which she took for 5 days without improvement.  Actually, she has had no further episodes of chest pain since her emergency  room visit.  She denies chronic regurgitation, burning substernal chest  pain, or any extraesophageal manifestations of acid reflux such as cough or  hoarseness or globus sensation.  She denies dysphagia, anorexia or weight  loss.  She denies any hepatobiliary complaints such as clay-colored stools,  dark urine, icterus, fever or chills.  She has no chronic cardiac history  and denies palpitations, shortness of breath with exertion, chest pain with  exertion, or any cardiovascular or pulmonary complaints.   PAST MEDICAL HISTORY:  1. Essential hypertension.  2. Hyperlipidemia.  3. Multinodular goiter.  4. Chronic anxiety syndrome.  5. Obesity.   She currently is on a variety of medications, including:  1. Lasix 20 mg a day.  2. Diovan/HCT 160/12.5 mg a day.  3. Potassium 20 mEq a day.  4. Zetia 10 mg a day.  5. Topamax twice a day.  6. Osteo Bi-Flex twice a day.  7. A variety of multivitamins.  8. Allegra-D p.r.n.   She in the  past has had reaction to PENICILLIN, ASPIRIN and ZESTRIL.   FAMILY HISTORY:  Remarkable for breast cancer but no known gastrointestinal  problems.   SOCIAL HISTORY:  She is married and lives with her husband and has a college  degree.  She is Chartered certified accountant for [employer].  She does not  smoke or abuse ethanol.   REVIEW OF SYSTEMS:  Otherwise noncontributory.  She did have colonoscopy  exam in March 2007, which showed diverticulosis.   PHYSICAL EXAMINATION:  GENERAL:  She is a healthy-appearing black female  appearing her stated age, in no acute distress.  I cannot appreciate  stigmata of chronic liver disease.  CHEST:  Entirely clear.  CARDIAC:  She appears to be in a regular rate and rhythm without murmurs,  gallops or rubs.  ABDOMEN:  I cannot appreciate hepatosplenomegaly, abdominal masses or  tenderness.  EXTREMITIES:  Peripheral extremities were unremarkable.  NEUROLOGIC:  Mental status was clear.  RECTAL:  Deferred.   ASSESSMENT:  Ms. Friar does have a large hiatal hernia on chest x-ray  and  seen on previous CT scan of the chest.  I suspect she may have had an  episode of gastric torsion or perhaps an element of a paraesophageal hernia  which accounted for sudden, rather acute chest pain without chronic acid  reflux symptoms.  Of course, need to exclude cholelithiasis.   RECOMMENDATIONS:  1. Outpatient upper abdominal ultrasound exam and endoscopy.  2. Reflux regimen with standard Protonix therapy until workup completed.  3. She may need barium swallow exam and surgical referral.  4. Continue other medications as per Dr. Loanne Drilling.                                   Loralee Pacas. Sharlett Iles, MD, [place], [place]   DRP/MedQ  DD:  11/17/2005  DT:  11/18/2005  Job #:  [id number]   cc:   Hilliard Clark A. Loanne Drilling, [place] Hassell Done, MD

## 2010-12-25 ENCOUNTER — Ambulatory Visit (INDEPENDENT_AMBULATORY_CARE_PROVIDER_SITE_OTHER): Payer: 59 | Admitting: Endocrinology

## 2010-12-25 ENCOUNTER — Encounter: Payer: Self-pay | Admitting: Endocrinology

## 2010-12-25 ENCOUNTER — Other Ambulatory Visit (INDEPENDENT_AMBULATORY_CARE_PROVIDER_SITE_OTHER): Payer: 59

## 2010-12-25 VITALS — BP 124/78 | HR 88 | Temp 98.7°F | Ht 64.5 in | Wt 276.4 lb

## 2010-12-25 DIAGNOSIS — E119 Type 2 diabetes mellitus without complications: Secondary | ICD-10-CM

## 2010-12-25 DIAGNOSIS — E7800 Pure hypercholesterolemia, unspecified: Secondary | ICD-10-CM

## 2010-12-25 DIAGNOSIS — I10 Essential (primary) hypertension: Secondary | ICD-10-CM

## 2010-12-25 DIAGNOSIS — K219 Gastro-esophageal reflux disease without esophagitis: Secondary | ICD-10-CM

## 2010-12-25 DIAGNOSIS — E042 Nontoxic multinodular goiter: Secondary | ICD-10-CM

## 2010-12-25 DIAGNOSIS — M25569 Pain in unspecified knee: Secondary | ICD-10-CM

## 2010-12-25 DIAGNOSIS — D649 Anemia, unspecified: Secondary | ICD-10-CM

## 2010-12-25 DIAGNOSIS — M25562 Pain in left knee: Secondary | ICD-10-CM

## 2010-12-25 DIAGNOSIS — K7689 Other specified diseases of liver: Secondary | ICD-10-CM

## 2010-12-25 DIAGNOSIS — Z23 Encounter for immunization: Secondary | ICD-10-CM

## 2010-12-25 LAB — URINALYSIS, ROUTINE W REFLEX MICROSCOPIC
Bilirubin Urine: NEGATIVE
Hgb urine dipstick: NEGATIVE
Nitrite: NEGATIVE
Total Protein, Urine: NEGATIVE
Urobilinogen, UA: 0.2 (ref 0.0–1.0)

## 2010-12-25 LAB — MICROALBUMIN / CREATININE URINE RATIO
Creatinine,U: 114.6 mg/dL
Microalb Creat Ratio: 1 mg/g (ref 0.0–30.0)
Microalb, Ur: 1.2 mg/dL (ref 0.0–1.9)

## 2010-12-25 LAB — BASIC METABOLIC PANEL
CO2: 29 mEq/L (ref 19–32)
Calcium: 9 mg/dL (ref 8.4–10.5)
Creatinine, Ser: 0.8 mg/dL (ref 0.4–1.2)
Glucose, Bld: 91 mg/dL (ref 70–99)

## 2010-12-25 LAB — LIPID PANEL
Cholesterol: 183 mg/dL (ref 0–200)
HDL: 71.2 mg/dL (ref >39.00)
LDL Cholesterol: 84 mg/dL (ref 0–99)
Triglycerides: 138 mg/dL (ref 0.0–149.0)
VLDL: 27.6 mg/dL (ref 0.0–40.0)

## 2010-12-25 LAB — CBC WITH DIFFERENTIAL/PLATELET
Basophils Relative: 0.7 % (ref 0.0–3.0)
Eosinophils Relative: 1.2 % (ref 0.0–5.0)
HCT: 40.8 % (ref 36.0–46.0)
Hemoglobin: 13.5 g/dL (ref 12.0–15.0)
Lymphs Abs: 1.4 10*3/uL (ref 0.7–4.0)
MCV: 91.3 fl (ref 78.0–100.0)
Monocytes Absolute: 0.4 10*3/uL (ref 0.1–1.0)
Monocytes Relative: 7.7 % (ref 3.0–12.0)
Neutro Abs: 2.9 10*3/uL (ref 1.4–7.7)
WBC: 4.8 10*3/uL (ref 4.5–10.5)

## 2010-12-25 LAB — HEPATIC FUNCTION PANEL
Albumin: 3.5 g/dL (ref 3.5–5.2)
Total Bilirubin: 0.5 mg/dL (ref 0.3–1.2)

## 2010-12-25 LAB — VITAMIN B12: Vitamin B-12: 563 pg/mL (ref 211–911)

## 2010-12-25 MED ORDER — COLESEVELAM HCL 625 MG PO TABS: ORAL_TABLET | ORAL | Status: DC

## 2010-12-25 MED ORDER — TRAMADOL-ACETAMINOPHEN 37.5-325 MG PO TABS: 1.0000 | ORAL_TABLET | ORAL | Status: DC | PRN

## 2010-12-25 MED ORDER — POTASSIUM CHLORIDE CRYS ER 20 MEQ PO TBCR: 20.0000 meq | EXTENDED_RELEASE_TABLET | Freq: Every day | ORAL | Status: DC

## 2010-12-25 MED ORDER — METFORMIN HCL 1000 MG PO TABS: 1000.0000 mg | ORAL_TABLET | Freq: Every day | ORAL | Status: DC

## 2010-12-25 MED ORDER — ROSUVASTATIN CALCIUM 40 MG PO TABS: 40.0000 mg | ORAL_TABLET | Freq: Every day | ORAL | Status: DC

## 2010-12-25 MED ORDER — FUROSEMIDE 20 MG PO TABS: 20.0000 mg | ORAL_TABLET | Freq: Every day | ORAL | Status: DC

## 2010-12-25 MED ORDER — FEXOFENADINE-PSEUDOEPHED ER 180-240 MG PO TB24: 1.0000 | ORAL_TABLET | Freq: Every day | ORAL | Status: DC

## 2010-12-25 NOTE — Progress Notes (Signed)
Subjective:    Patient ID: Heather [name], female    DOB: [birth year], 65 y.o.   MRN: [id number]  HPI here for regular wellness examination.  He's feeling pretty well in general, except for fatigue, and says chronic med probs are stable, except as noted below.  Gyn is dr Leo Grosser Past Medical History  Diagnosis Date  . ALLERGIC RHINITIS 10/22/2006  . ANEMIA 12/18/2008  . ANXIETY 10/22/2006  . ASYMPTOMATIC POSTMENOPAUSAL STATUS 11/22/2007  . DIABETES MELLITUS, TYPE II 01/13/2007  . GERD 07/20/2007  . GOITER, MULTINODULAR 07/20/2007  . Headache 07/20/2007  . HEARING LOSS 11/22/2007  . HYPERCHOLESTEROLEMIA 01/13/2007  . HYPERTENSION 10/22/2006  . OSTEOARTHRITIS 10/22/2006  . Other chronic nonalcoholic liver disease 1/88/4166  . Hyperglycemia   . NASH (nonalcoholic steatohepatitis)     Past Surgical History  Procedure Date  . Esophagogastroduodenoscopy 12/08/2005  . Stress cardiolite 10/21/2005  . Electrocardiogram 10/15/2006  . Dexa 08/2005    History   Social History  . Marital Status: Married    Spouse Name: N/A    Number of Children: N/A  . Years of Education: N/A   Occupational History  . Retired    Social History Main Topics  . Smoking status: Former Research scientist (life sciences)  . Smokeless tobacco: Not on file  . Alcohol Use: Not on file  . Drug Use: Not on file  . Sexually Active: Not on file   Other Topics Concern  . Not on file   Social History Narrative  . No narrative on file    Current Outpatient Prescriptions on File Prior to Visit  Medication Sig Dispense Refill  . Calcium Carbonate-Vitamin D (CALCIUM 600 + D PO) Take 2 tablets by mouth daily.        . Ferrous Sulfate (IRON) 325 (65 FE) MG TABS Take 1 tablet by mouth daily.        [name] glucosamine-chondroitin 500-400 MG tablet Take 1 tablet by mouth 2 (two) times daily.        . Multiple Vitamin (MULTIVITAMIN) tablet Take 1 tablet by mouth daily.        [name] topiramate (TOPAMAX) 100 MG tablet Take 100 mg by mouth 3 (three) times  daily.          Allergies  Allergen Reactions  . Lisinopril     REACTION: cough  . Penicillins     REACTION: rash    Family History  Problem Relation Age of Onset  . Cancer Mother     Breast Cancer, Colon Cancer    BP 124/78  Pulse 88  Temp(Src) 98.7 F (37.1 C) (Oral)  Ht 5' 4.5" (1.638 m)  Wt 276 lb 6 oz (125.363 kg)  BMI 46.71 kg/m2  SpO2 97%    Review of Systems  Constitutional: Negative for fever.  HENT: Negative for hearing loss.   Eyes: Negative for visual disturbance.  Respiratory:       She attributes doe to weight gain  Cardiovascular: Negative for chest pain.  Gastrointestinal: Negative for abdominal pain and anal bleeding.  Genitourinary: Negative for hematuria.  Musculoskeletal: Negative for back pain.  Skin: Negative for rash.  Neurological: Negative for syncope.  Hematological: Does not bruise/bleed easily.  Psychiatric/Behavioral: Negative for dysphoric mood.       Objective:   Physical Exam VS: see vs page GEN: no distress HEAD: head: no deformity eyes: no periorbital swelling, no proptosis external nose and ears are normal mouth: no lesion seen NECK: supple, thyroid is not enlarged CHEST WALL: no  deformity LUNGS:  Clear to auscultation BREASTS:  sees gyn CV: reg rate and rhythm, no murmur ABD: abdomen is soft, nontender.  no hepatosplenomegaly.  not distended.  no hernia GENITALIA/RECTAL:  sees gyn MUSCULOSKELETAL: muscle bulk and strength are grossly normal.  no obvious joint swelling.  gait is normal and steady EXTEMITIES: no deformity.  no ulcer on the feet.  feet are of normal color and temp.  no edema PULSES: dorsalis pedis intact bilat.  no carotid bruit NEURO:  cn 2-12 grossly intact.   readily moves all 4's.  sensation is intact to touch on the feet SKIN:  Normal texture and temperature.  No rash or suspicious lesion is visible.  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   NODES:  None palpable  at the neck PSYCH: alert, oriented x3.  Does not appear anxious nor depressed.     i reviewed electrocardiogram    Assessment & Plan:  Wellness visit today, with problems stable, except as noted.    SEPARATE EVALUATION FOLLOWS--EACH PROBLEM HERE IS NEW, NOT RESPONDING TO TREATMENT, OR POSES SIGNIFICANT RISK TO THE PATIENT'S HEALTH: HISTORY OF THE PRESENT ILLNESS: Pt twisted left knee in the bed ("i caught it in the sheets, rushing to the bathroom) 2 weeks ago.  Since then, she has had slight pain there , and assoc "slippage" She sometimes misses the welchol.   PAST MEDICAL HISTORY reviewed and up to date today REVIEW OF SYSTEMS: Denies numbness and weight change PHYSICAL EXAMINATION: VITAL SIGNS:  See vs page GENERAL: no distress Msk: left knee is normal to my exam LAB/XRAY RESULTS: Lab Results  Component Value Date   CHOL 183 12/25/2010   HDL 71.20 12/25/2010   LDLCALC 84 12/25/2010   TRIG 138.0 12/25/2010   CHOLHDL 3 12/25/2010   IMPRESSION: Knee sprain, with ? Of instability Dyslipidemia, needs increased rx PLAN: See instruction page i refilled welchol

## 2010-12-25 NOTE — Patient Instructions (Addendum)
Refer to an orthopedic specialist.  you will receive a phone call, about a day and time for an appointment. please consider these measures for your health:  minimize alcohol.  do not use tobacco products.  have a colonoscopy at least every 10 years from age 64.  Women should have an annual mammogram from age 59.  keep firearms safely stored.  always use seat belts.  have working smoke alarms in your home.  see an eye doctor and dentist regularly.  never drive under the influence of alcohol or drugs (including prescription drugs).   please let me know what your wishes would be, if artificial life support measures should become necessary.  it is critically important to prevent falling down (keep floor areas well-lit, dry, and free of loose objects.  If you have a cane, walker, or wheelchair, you should use it, even for short trips around the house.  Also, try not to rush).   Please come back for a follow-up appointment in 6 months.   blood tests are being requested for you today.  please call [phone] to hear your test results.  You will be prompted to enter the 9-digit "MRN" number that appears at the top left of this page, followed by #.  Then you will hear the message.

## 2011-06-25 ENCOUNTER — Encounter: Payer: Self-pay | Admitting: Endocrinology

## 2011-06-25 ENCOUNTER — Ambulatory Visit (INDEPENDENT_AMBULATORY_CARE_PROVIDER_SITE_OTHER): Payer: 59 | Admitting: Endocrinology

## 2011-06-25 ENCOUNTER — Other Ambulatory Visit (INDEPENDENT_AMBULATORY_CARE_PROVIDER_SITE_OTHER): Payer: 59

## 2011-06-25 VITALS — BP 102/62 | HR 79 | Temp 96.3°F | Ht 64.0 in | Wt 280.0 lb

## 2011-06-25 DIAGNOSIS — E119 Type 2 diabetes mellitus without complications: Secondary | ICD-10-CM

## 2011-06-25 NOTE — Progress Notes (Signed)
Subjective:    Patient ID: Heather [name], female    DOB: 30-May-1945, 66 y.o.   MRN: [id number]  HPI Pt returns for f/u of type 2 DM (dx'ed 0017--CB known complications).  pt states she feels well in general, except for fatigue.   Past Medical History  Diagnosis Date  . ALLERGIC RHINITIS 10/22/2006  . ANEMIA 12/18/2008  . ANXIETY 10/22/2006  . ASYMPTOMATIC POSTMENOPAUSAL STATUS 11/22/2007  . DIABETES MELLITUS, TYPE II 01/13/2007  . GERD 07/20/2007  . GOITER, MULTINODULAR 07/20/2007  . Headache 07/20/2007  . HEARING LOSS 11/22/2007  . HYPERCHOLESTEROLEMIA 01/13/2007  . HYPERTENSION 10/22/2006  . OSTEOARTHRITIS 10/22/2006  . Other chronic nonalcoholic liver disease 4/49/6759  . Hyperglycemia   . NASH (nonalcoholic steatohepatitis)     Past Surgical History  Procedure Date  . Esophagogastroduodenoscopy 12/08/2005  . Stress cardiolite 10/21/2005  . Electrocardiogram 10/15/2006  . Dexa 08/2005    History   Social History  . Marital Status: Married    Spouse Name: N/A    Number of Children: N/A  . Years of Education: N/A   Occupational History  . Retired    Social History Main Topics  . Smoking status: Former Research scientist (life sciences)  . Smokeless tobacco: Not on file  . Alcohol Use: Not on file  . Drug Use: Not on file  . Sexually Active: Not on file   Other Topics Concern  . Not on file   Social History Narrative  . No narrative on file    Current Outpatient Prescriptions on File Prior to Visit  Medication Sig Dispense Refill  . Calcium Carbonate-Vitamin D (CALCIUM 600 + D PO) Take 2 tablets by mouth daily.        . Ferrous Sulfate (IRON) 325 (65 FE) MG TABS Take 1 tablet by mouth daily.        . fexofenadine-pseudoephedrine (ALLEGRA-D 24) 180-240 MG per 24 hr tablet Take 1 tablet by mouth daily.  30 tablet  11  . furosemide (LASIX) 20 MG tablet Take 1 tablet (20 mg total) by mouth daily.  90 tablet  3  . glucosamine-chondroitin 500-400 MG tablet Take 1 tablet by mouth 2 (two) times  daily.        . metFORMIN (GLUCOPHAGE) 1000 MG tablet Take 1 tablet (1,000 mg total) by mouth daily.  90 tablet  3  . Multiple Vitamin (MULTIVITAMIN) tablet Take 1 tablet by mouth daily.        . potassium chloride SA (K-DUR,KLOR-CON) 20 MEQ tablet Take 1 tablet (20 mEq total) by mouth daily.  90 tablet  3  . propranolol (INDERAL) 20 MG tablet Take 20 mg by mouth 2 (two) times daily. For migraines       . rosuvastatin (CRESTOR) 40 MG tablet Take 1 tablet (40 mg total) by mouth daily.  90 tablet  3  . topiramate (TOPAMAX) 100 MG tablet Take 100 mg by mouth 3 (three) times daily.        . traMADol-acetaminophen (ULTRACET) 37.5-325 MG per tablet Take 1 tablet by mouth every 4 (four) hours as needed. For pain  50 tablet  2  . colesevelam (WELCHOL) 625 MG tablet 6 tablets by mouth once daily  540 tablet  3    Allergies  Allergen Reactions  . Lisinopril     REACTION: cough  . Penicillins     REACTION: rash    Family History  Problem Relation Age of Onset  . Cancer Mother     Breast Cancer, Colon Cancer  BP 102/62  Pulse 79  Temp(Src) 96.3 F (35.7 C) (Oral)  Ht 5' 4"  (1.626 m)  Wt 280 lb (127.007 kg)  BMI 48.06 kg/m2  SpO2 99%    Review of Systems Denies fever     Objective:   Physical Exam VITAL SIGNS:  See vs page GENERAL: no distress Pulses: dorsalis pedis intact bilat.   Feet: no deformity.  no ulcer on the feet.  feet are of normal color and temp.  Trace bilat leg edema.  There is bilateral onychomycosis. Neuro: sensation is intact to touch on the feet    Lab Results  Component Value Date   HGBA1C 6.5 06/25/2011      Assessment & Plan:  DM.  well-controlled

## 2011-06-25 NOTE — Patient Instructions (Addendum)
Please come back for a regular physical appointment in 6 months (after 12/25/11).   blood tests are being requested for you today.  You will receive a letter with results.

## 2011-06-26 ENCOUNTER — Telehealth: Payer: Self-pay | Admitting: *Deleted

## 2011-06-26 NOTE — Telephone Encounter (Signed)
Called pt to inform of A1c results, left message for pt to callback office (letter also mailed to pt).

## 2011-07-07 ENCOUNTER — Ambulatory Visit: Payer: Self-pay | Admitting: Obstetrics and Gynecology

## 2011-08-03 ENCOUNTER — Encounter: Payer: Self-pay | Admitting: Obstetrics and Gynecology

## 2011-08-03 DIAGNOSIS — D219 Benign neoplasm of connective and other soft tissue, unspecified: Secondary | ICD-10-CM | POA: Insufficient documentation

## 2011-08-04 ENCOUNTER — Encounter: Payer: Self-pay | Admitting: Obstetrics and Gynecology

## 2011-08-04 ENCOUNTER — Ambulatory Visit (INDEPENDENT_AMBULATORY_CARE_PROVIDER_SITE_OTHER): Payer: 59 | Admitting: Obstetrics and Gynecology

## 2011-08-04 VITALS — BP 132/72 | Ht 64.5 in | Wt 279.0 lb

## 2011-08-04 DIAGNOSIS — Z780 Asymptomatic menopausal state: Secondary | ICD-10-CM

## 2011-08-04 DIAGNOSIS — Z01419 Encounter for gynecological examination (general) (routine) without abnormal findings: Secondary | ICD-10-CM

## 2011-08-04 DIAGNOSIS — D219 Benign neoplasm of connective and other soft tissue, unspecified: Secondary | ICD-10-CM

## 2011-08-04 DIAGNOSIS — D259 Leiomyoma of uterus, unspecified: Secondary | ICD-10-CM

## 2011-08-04 NOTE — Progress Notes (Signed)
The patient is not taking hormone replacement therapy The patient  is taking a Calcium supplement. Post-menopausal bleeding:no  Last Pap: approximate date 07/17/2009 and was normal  Last mammogram: approximate date 2011 and was normal  Last DEXA scan : 2002 Last colonoscopy:was normal   Urinary symptoms: urinary frequency Normal bowel movements: Yes Reports abuse at home: No: Subjective:    Heather [name] is a 66 y.o. female [id number] who presents for annual exam.  The patient has no complaints today.   The following portions of the patient's history were reviewed and updated as appropriate: allergies, current medications, past family history, past medical history, past social history, past surgical history and problem list.  Review of Systems Pertinent items are noted in HPI. Gastrointestinal:No change in bowel habits, no abdominal pain, no rectal bleeding Genitourinary:negative for dysuria, frequency, hematuria, nocturia and urinary incontinence    Objective:     BP 132/72  Ht 5' 4.5" (1.638 m)  Wt 279 lb (126.554 kg)  BMI 47.15 kg/m2  Weight:  Wt Readings from Last 1 Encounters:  08/04/11 279 lb (126.554 kg)     BMI: Body mass index is 47.15 kg/(m^2). General Appearance: Alert, appropriate appearance for age. No acute distress HEENT: Grossly normal Neck / Thyroid: Supple, no masses, nodes or enlargement Lungs: clear to auscultation bilaterally Back: No CVA tenderness Breast Exam: No masses or nodes.No dimpling, nipple retraction or discharge.patient has a well-healed incision status post reduction mammoplasty 35 years ago Cardiovascular: Regular rate and rhythm. S1, S2, no murmur Gastrointestinal: Soft, non-tender, no masses or organomegaly Pelvic Exam: External genitalia: normal general appearance Vaginal: normal mucosa without prolapse or lesions Cervix: normal appearance Adnexa: non palpable Uterus: exam compromised by patient's habitus but the uterus does not feel  significantly enlarged Rectovaginal: normal rectal, no masses Lymphatic Exam: Non-palpable nodes in neck, clavicular, axillary, or inguinal regions Skin: no rash or abnormalities Neurologic: Normal gait and speech, no tremor  Psychiatric: Alert and oriented, appropriate affect.    Urinalysis:Not done      Assessment:    History of fibroids no symptoms   Multiple medical problems as outlined on the problem list Plan:   Pap smear due in 2014 Follow-up:  for annual exam

## 2011-08-04 NOTE — Patient Instructions (Signed)
Pt to schedule Mammogram at [facility]

## 2011-11-18 ENCOUNTER — Encounter: Payer: Self-pay | Admitting: Gastroenterology

## 2011-12-22 DIAGNOSIS — G43009 Migraine without aura, not intractable, without status migrainosus: Secondary | ICD-10-CM | POA: Diagnosis not present

## 2011-12-28 ENCOUNTER — Ambulatory Visit (INDEPENDENT_AMBULATORY_CARE_PROVIDER_SITE_OTHER): Payer: 59 | Admitting: Endocrinology

## 2011-12-28 ENCOUNTER — Other Ambulatory Visit: Payer: Self-pay | Admitting: General Practice

## 2011-12-28 VITALS — BP 130/76 | HR 79 | Temp 98.2°F | Wt 291.0 lb

## 2011-12-28 DIAGNOSIS — K7689 Other specified diseases of liver: Secondary | ICD-10-CM | POA: Diagnosis not present

## 2011-12-28 DIAGNOSIS — E119 Type 2 diabetes mellitus without complications: Secondary | ICD-10-CM

## 2011-12-28 DIAGNOSIS — F411 Generalized anxiety disorder: Secondary | ICD-10-CM

## 2011-12-28 DIAGNOSIS — Z119 Encounter for screening for infectious and parasitic diseases, unspecified: Secondary | ICD-10-CM | POA: Diagnosis not present

## 2011-12-28 DIAGNOSIS — I10 Essential (primary) hypertension: Secondary | ICD-10-CM | POA: Diagnosis not present

## 2011-12-28 DIAGNOSIS — R0600 Dyspnea, unspecified: Secondary | ICD-10-CM

## 2011-12-28 DIAGNOSIS — Z23 Encounter for immunization: Secondary | ICD-10-CM

## 2011-12-28 DIAGNOSIS — E7800 Pure hypercholesterolemia, unspecified: Secondary | ICD-10-CM | POA: Diagnosis not present

## 2011-12-28 DIAGNOSIS — E042 Nontoxic multinodular goiter: Secondary | ICD-10-CM | POA: Diagnosis not present

## 2011-12-28 DIAGNOSIS — Z79899 Other long term (current) drug therapy: Secondary | ICD-10-CM | POA: Insufficient documentation

## 2011-12-28 DIAGNOSIS — R0989 Other specified symptoms and signs involving the circulatory and respiratory systems: Secondary | ICD-10-CM

## 2011-12-28 DIAGNOSIS — R0609 Other forms of dyspnea: Secondary | ICD-10-CM | POA: Diagnosis not present

## 2011-12-28 LAB — CBC WITH DIFFERENTIAL/PLATELET
Basophils Absolute: 0 10*3/uL (ref 0.0–0.1)
Lymphocytes Relative: 34 % (ref 12–46)
Neutro Abs: 2.3 10*3/uL (ref 1.7–7.7)
Neutrophils Relative %: 56 % (ref 43–77)
Platelets: 256 10*3/uL (ref 150–400)
RBC: 4.36 MIL/uL (ref 3.87–5.11)
RDW: 15.7 % — ABNORMAL HIGH (ref 11.5–15.5)
WBC: 4.2 10*3/uL (ref 4.0–10.5)

## 2011-12-28 LAB — HEPATITIS C ANTIBODY: HCV Ab: NEGATIVE

## 2011-12-28 LAB — LIPID PANEL
Cholesterol: 196 mg/dL (ref 0–200)
LDL Cholesterol: 112 mg/dL — ABNORMAL HIGH (ref 0–99)
Triglycerides: 97 mg/dL (ref <150)

## 2011-12-28 LAB — HEPATITIS B SURFACE ANTIBODY,QUALITATIVE: Hep B S Ab: NEGATIVE

## 2011-12-28 LAB — HEMOGLOBIN A1C: Mean Plasma Glucose: 146 mg/dL — ABNORMAL HIGH (ref <117)

## 2011-12-28 LAB — HEPATIC FUNCTION PANEL
Albumin: 3.7 g/dL (ref 3.5–5.2)
Alkaline Phosphatase: 58 U/L (ref 39–117)
Total Bilirubin: 0.3 mg/dL (ref 0.3–1.2)

## 2011-12-28 LAB — BASIC METABOLIC PANEL
CO2: 28 mEq/L (ref 19–32)
Calcium: 9.1 mg/dL (ref 8.4–10.5)
Sodium: 140 mEq/L (ref 135–145)

## 2011-12-28 MED ORDER — METFORMIN HCL 1000 MG PO TABS: 1000.0000 mg | ORAL_TABLET | Freq: Every day | ORAL | Status: DC

## 2011-12-28 MED ORDER — FEXOFENADINE-PSEUDOEPHED ER 180-240 MG PO TB24: 1.0000 | ORAL_TABLET | Freq: Every day | ORAL | Status: DC

## 2011-12-28 MED ORDER — POTASSIUM CHLORIDE CRYS ER 20 MEQ PO TBCR: 20.0000 meq | EXTENDED_RELEASE_TABLET | Freq: Every day | ORAL | Status: DC

## 2011-12-28 MED ORDER — COLESEVELAM HCL 3.75 G PO PACK: PACK | ORAL | Status: DC

## 2011-12-28 MED ORDER — FUROSEMIDE 20 MG PO TABS: 20.0000 mg | ORAL_TABLET | Freq: Every day | ORAL | Status: DC

## 2011-12-28 MED ORDER — ROSUVASTATIN CALCIUM 40 MG PO TABS: 40.0000 mg | ORAL_TABLET | Freq: Every day | ORAL | Status: DC

## 2011-12-28 NOTE — Patient Instructions (Addendum)
good diet and exercise habits significanly improve the control of your diabetes.  please let me know if you wish to be referred to a dietician.  high blood sugar is very risky to your health.  you should see an eye doctor every year.  You are at higher than average risk for pneumonia and hepatitis-B.  You should be vaccinated against both.   controlling your blood pressure and cholesterol drastically reduces the damage diabetes does to your body.  this also applies to quitting smoking.  please discuss these with your doctor.  you should take an aspirin every day, unless you have been advised by a doctor not to.   please consider these measures for your health:  minimize alcohol.  do not use tobacco products.  have a colonoscopy at least every 10 years from age 59.  Women should have an annual mammogram from age 57.  keep firearms safely stored.  always use seat belts.  have working smoke alarms in your home.  see an eye doctor and dentist regularly.  never drive under the influence of alcohol or drugs (including prescription drugs).   Please come back for a follow-up appointment in 6 months.   blood tests are being requested for you today.  You will be contacted with results.

## 2011-12-28 NOTE — Progress Notes (Signed)
Subjective:    Patient ID: Heather [name], female    DOB: [birth year], 66 y.o.   MRN: [id number]  HPI here for regular wellness examination.  He's feeling pretty well in general, and says chronic med probs are stable, except as noted below Past Medical History  Diagnosis Date  . ALLERGIC RHINITIS 10/22/2006  . ANEMIA 12/18/2008  . ASYMPTOMATIC POSTMENOPAUSAL STATUS 11/22/2007  . DIABETES MELLITUS, TYPE II 01/13/2007  . GERD 07/20/2007  . GOITER, MULTINODULAR 07/20/2007  . Headache 07/20/2007  . HEARING LOSS 11/22/2007  . HYPERCHOLESTEROLEMIA 01/13/2007  . HYPERTENSION 10/22/2006  . OSTEOARTHRITIS 10/22/2006  . Other chronic nonalcoholic liver disease 3/32/9518  . Hyperglycemia   . NASH (nonalcoholic steatohepatitis)   . Fibroid   . Hx of colposcopy with cervical biopsy   . Abnormal Pap smear   . Complication of anesthesia     Past Surgical History  Procedure Date  . Esophagogastroduodenoscopy 12/08/2005  . Stress cardiolite 10/21/2005  . Electrocardiogram 10/15/2006  . Dexa 08/2005  . Wisdom tooth extraction   . Breast surgery     Breast reduction  . Sweat gland removal   . Dilation and curettage of uterus   . Eye surgery     History   Social History  . Marital Status: Married    Spouse Name: N/A    Number of Children: N/A  . Years of Education: N/A   Occupational History  . Retired    Social History Main Topics  . Smoking status: Former Research scientist (life sciences)  . Smokeless tobacco: Never Used  . Alcohol Use: Yes     Comment: occasional   . Drug Use: No  . Sexually Active: Yes    Birth Control/ Protection: Surgical   Other Topics Concern  . Not on file   Social History Narrative  . No narrative on file    Current Outpatient Prescriptions on File Prior to Visit  Medication Sig Dispense Refill  . Calcium Carbonate-Vitamin D (CALCIUM 600 + D PO) Take 2 tablets by mouth daily.        . Ferrous Sulfate (IRON) 325 (65 FE) MG TABS Take 1 tablet by mouth daily.        [name]  glucosamine-chondroitin 500-400 MG tablet Take 1 tablet by mouth 2 (two) times daily.        . Multiple Vitamin (MULTIVITAMIN) tablet Take 1 tablet by mouth daily.        [name] omeprazole (PRILOSEC) 20 MG capsule Take 20 mg by mouth daily.      . propranolol (INDERAL) 20 MG tablet Take 20 mg by mouth 2 (two) times daily. For migraines       . SUMAtriptan (IMITREX) 100 MG tablet Take 100 mg by mouth 2 (two) times daily.      [name] topiramate (TOPAMAX) 100 MG tablet Take 100 mg by mouth 3 (three) times daily.        . traMADol-acetaminophen (ULTRACET) 37.5-325 MG per tablet Take 1 tablet by mouth every 4 (four) hours as needed. For pain  50 tablet  2  . vitamin E 200 UNIT capsule Take 200 Units by mouth daily.      . Colesevelam HCl (WELCHOL) 3.75 G PACK Pt normally takes 6 pills a day. Pt normally gets a 3 month supply.  30 each  3  . furosemide (LASIX) 20 MG tablet Take 1 tablet (20 mg total) by mouth daily.  90 tablet  3  . metFORMIN (GLUCOPHAGE) 1000 MG tablet Take 1 tablet (1,000  mg total) by mouth daily.  90 tablet  3  . potassium chloride SA (K-DUR,KLOR-CON) 20 MEQ tablet Take 1 tablet (20 mEq total) by mouth daily.  90 tablet  3  . rosuvastatin (CRESTOR) 40 MG tablet Take 1 tablet (40 mg total) by mouth daily.  90 tablet  3    Allergies  Allergen Reactions  . Aspirin   . Lisinopril     REACTION: cough  . Penicillins     REACTION: rash    Family History  Problem Relation Age of Onset  . Cancer Mother     Breast Cancer, Colon Cancer  . Asthma Mother   . Depression Mother   . Bipolar disorder Mother   . Dementia Mother   . Arthritis Mother   . Hypertension Father   . Migraines Father   . Cancer Maternal Aunt   . Heart disease Maternal Grandmother   . Cancer Maternal Grandfather     BP 130/76  Pulse 79  Temp 98.2 F (36.8 C) (Oral)  Wt 291 lb (131.997 kg)  SpO2 97%     Review of Systems  Constitutional: Negative for unexpected weight change.  HENT: Negative for hearing  loss.   Eyes: Negative for visual disturbance.  Respiratory:       No change in chronic doe  Gastrointestinal: Negative for anal bleeding.  Genitourinary: Negative for hematuria and vaginal bleeding.  Musculoskeletal: Positive for arthralgias.  Skin: Negative for rash.  Neurological: Negative for numbness.  Hematological: Does not bruise/bleed easily.  Psychiatric/Behavioral: Negative for decreased concentration.       Objective:   Physical Exam HEAD: no deformity eyes: no periorbital swelling, no proptosis external nose and ears are normal mouth: no lesion seen NECK: supple, thyroid is not enlarged CHEST WALL: no deformity LUNGS:  Clear to auscultation BREASTS:  No mass.  No d/c CV: reg rate and rhythm, no murmur ABD: abdomen is soft, nontender.  no hepatosplenomegaly.  not distended.  no hernia GENITALIA:  Normal external female.  Normal bimanual exam RECTAL: normal external and internal exam.  heme neg MUSCULOSKELETAL: muscle bulk and strength are grossly normal.  no obvious joint swelling.  gait is normal and steady EXTEMITIES: no deformity.  no ulcer on the feet.  feet are of normal color and temp.  PULSES: dorsalis pedis intact bilat.  no carotid bruit NEURO:  cn 2-12 grossly intact.   readily moves all 4's.  sensation is intact to touch on the feet SKIN:  Normal texture and temperature.  No rash or suspicious lesion is visible.   NODES:  None palpable at the neck. PSYCH: alert, oriented x3.  Does not appear anxious nor depressed.     Assessment & Plan:  Wellness visit today, with problems stable, except as noted.   SEPARATE EVALUATION FOLLOWS--EACH PROBLEM HERE IS NEW, NOT RESPONDING TO TREATMENT, OR POSES SIGNIFICANT RISK TO THE PATIENT'S HEALTH: HISTORY OF THE PRESENT ILLNESS: Edema persists despite lasix Dyslipdemia: she has chronicallly been rx'ed crestor. PAST MEDICAL HISTORY reviewed and up to date today REVIEW OF SYSTEMS: Denies syncope and chest  pain PHYSICAL EXAMINATION: VITAL SIGNS:  See vs page GENERAL: no distress 1+ bilat leg edema VITAL SIGNS:  See vs page GENERAL: no distress LAB/XRAY RESULTS: Lab Results  Component Value Date   CHOL 196 12/28/2011   HDL 65 12/28/2011   LDLCALC 112* 12/28/2011   TRIG 97 12/28/2011   CHOLHDL 3.0 12/28/2011  IMPRESSION: Dyslipidemia: needs increased rx--? Compliance Edema: bnp strongly suggests this  is localized to the legs. PLAN: See instruction page Pt advised to wear compression stockings

## 2011-12-29 LAB — URINALYSIS, ROUTINE W REFLEX MICROSCOPIC
Ketones, ur: NEGATIVE mg/dL
Leukocytes, UA: NEGATIVE
Nitrite: NEGATIVE
Specific Gravity, Urine: 1.019 (ref 1.005–1.030)
Urobilinogen, UA: 0.2 mg/dL (ref 0.0–1.0)

## 2011-12-29 LAB — MICROALBUMIN / CREATININE URINE RATIO: Microalb Creat Ratio: 4.6 mg/g (ref 0.0–30.0)

## 2011-12-31 ENCOUNTER — Encounter: Payer: Self-pay | Admitting: Endocrinology

## 2012-03-11 DIAGNOSIS — R404 Transient alteration of awareness: Secondary | ICD-10-CM | POA: Insufficient documentation

## 2012-03-11 DIAGNOSIS — G4710 Hypersomnia, unspecified: Secondary | ICD-10-CM | POA: Insufficient documentation

## 2012-03-11 DIAGNOSIS — G43009 Migraine without aura, not intractable, without status migrainosus: Secondary | ICD-10-CM | POA: Diagnosis not present

## 2012-05-25 DIAGNOSIS — E119 Type 2 diabetes mellitus without complications: Secondary | ICD-10-CM | POA: Diagnosis not present

## 2012-06-02 ENCOUNTER — Ambulatory Visit
Admission: RE | Admit: 2012-06-02 | Discharge: 2012-06-02 | Disposition: A | Discharge status: Home / Self Care | Payer: Medicare Other | Source: Ambulatory Visit | Attending: Endocrinology | Admitting: Endocrinology

## 2012-06-02 ENCOUNTER — Ambulatory Visit (INDEPENDENT_AMBULATORY_CARE_PROVIDER_SITE_OTHER): Payer: Medicare Other | Admitting: Endocrinology

## 2012-06-02 ENCOUNTER — Encounter: Payer: Self-pay | Admitting: Endocrinology

## 2012-06-02 ENCOUNTER — Other Ambulatory Visit: Payer: Self-pay

## 2012-06-02 ENCOUNTER — Ambulatory Visit: Payer: Medicare Other | Admitting: *Deleted

## 2012-06-02 VITALS — BP 128/76 | HR 76 | Wt 293.0 lb

## 2012-06-02 DIAGNOSIS — E119 Type 2 diabetes mellitus without complications: Secondary | ICD-10-CM

## 2012-06-02 DIAGNOSIS — M2550 Pain in unspecified joint: Secondary | ICD-10-CM

## 2012-06-02 DIAGNOSIS — R0600 Dyspnea, unspecified: Secondary | ICD-10-CM

## 2012-06-02 DIAGNOSIS — J45909 Unspecified asthma, uncomplicated: Secondary | ICD-10-CM | POA: Diagnosis not present

## 2012-06-02 DIAGNOSIS — R0609 Other forms of dyspnea: Secondary | ICD-10-CM

## 2012-06-02 DIAGNOSIS — M1710 Unilateral primary osteoarthritis, unspecified knee: Secondary | ICD-10-CM | POA: Diagnosis not present

## 2012-06-02 DIAGNOSIS — R0989 Other specified symptoms and signs involving the circulatory and respiratory systems: Secondary | ICD-10-CM | POA: Diagnosis not present

## 2012-06-02 LAB — CBC WITH DIFFERENTIAL/PLATELET
Eosinophils Absolute: 0.1 10*3/uL (ref 0.0–0.7)
Eosinophils Relative: 1.5 % (ref 0.0–5.0)
HCT: 39.6 % (ref 36.0–46.0)
Lymphs Abs: 1.6 10*3/uL (ref 0.7–4.0)
MCHC: 32.6 g/dL (ref 30.0–36.0)
MCV: 89.8 fl (ref 78.0–100.0)
Monocytes Absolute: 0.5 10*3/uL (ref 0.1–1.0)
Neutrophils Relative %: 61.2 % (ref 43.0–77.0)
Platelets: 224 10*3/uL (ref 150.0–400.0)
RDW: 16.1 % — ABNORMAL HIGH (ref 11.5–14.6)
WBC: 5.6 10*3/uL (ref 4.5–10.5)

## 2012-06-02 MED ORDER — FLUTICASONE-SALMETEROL 100-50 MCG/DOSE IN AEPB: 1.0000 | INHALATION_SPRAY | Freq: Two times a day (BID) | RESPIRATORY_TRACT | Status: DC

## 2012-06-02 MED ORDER — METFORMIN HCL ER 500 MG PO TB24: 1000.0000 mg | ORAL_TABLET | Freq: Two times a day (BID) | ORAL | Status: DC

## 2012-06-02 NOTE — Patient Instructions (Addendum)
Let's check a breathing test at the old office, at 1 pm today blood tests and x-rays are being requested for you today.  We'll contact you with results.  Try just taking 1/2 of an allegra-d at a time, to see if that reduces your dry mouth Change metformin to the extended-release, to see if this helps your stomach symptoms.  i have sent a prescription to your pharmacy.

## 2012-06-02 NOTE — Progress Notes (Signed)
Subjective:    Patient ID: Heather [name], female    DOB: Dec 20, 1945, 67 y.o.   MRN: [id number]  HPI Pt states few mos of slight sob sensation in the chest, and assoc fatigue.   She reports dry mouth.  Leg edema persists.she has dyspeptic sxs, worse with drinking milk.   She reports right knee pain.  No injury Past Medical History  Diagnosis Date  . ALLERGIC RHINITIS 10/22/2006  . ANEMIA 12/18/2008  . ASYMPTOMATIC POSTMENOPAUSAL STATUS 11/22/2007  . DIABETES MELLITUS, TYPE II 01/13/2007  . GERD 07/20/2007  . GOITER, MULTINODULAR 07/20/2007  . Headache 07/20/2007  . HEARING LOSS 11/22/2007  . HYPERCHOLESTEROLEMIA 01/13/2007  . HYPERTENSION 10/22/2006  . OSTEOARTHRITIS 10/22/2006  . Other chronic nonalcoholic liver disease 6/38/7564  . Hyperglycemia   . NASH (nonalcoholic steatohepatitis)   . Fibroid   . Hx of colposcopy with cervical biopsy   . Abnormal Pap smear   . Complication of anesthesia     Past Surgical History  Procedure Laterality Date  . Esophagogastroduodenoscopy  12/08/2005  . Stress cardiolite  10/21/2005  . Electrocardiogram  10/15/2006  . Dexa  08/2005  . Wisdom tooth extraction    . Breast surgery      Breast reduction  . Sweat gland removal    . Dilation and curettage of uterus    . Eye surgery      History   Social History  . Marital Status: Married    Spouse Name: N/A    Number of Children: N/A  . Years of Education: N/A   Occupational History  . Retired    Social History Main Topics  . Smoking status: Former Research scientist (life sciences)  . Smokeless tobacco: Never Used  . Alcohol Use: Yes     Comment: occasional   . Drug Use: No  . Sexually Active: Yes    Birth Control/ Protection: Surgical   Other Topics Concern  . Not on file   Social History Narrative  . No narrative on file    Current Outpatient Prescriptions on File Prior to Visit  Medication Sig Dispense Refill  . Calcium Carbonate-Vitamin D (CALCIUM 600 + D PO) Take 2 tablets by mouth daily.         . Colesevelam HCl (WELCHOL) 3.75 G PACK Pt normally takes 6 pills a day. Pt normally gets a 3 month supply.  30 each  3  . Ferrous Sulfate (IRON) 325 (65 FE) MG TABS Take 1 tablet by mouth daily.        . fexofenadine-pseudoephedrine (ALLEGRA-D 24) 180-240 MG per 24 hr tablet Take 1 tablet by mouth daily.  30 tablet  11  . furosemide (LASIX) 20 MG tablet Take 1 tablet (20 mg total) by mouth daily.  90 tablet  3  . glucosamine-chondroitin 500-400 MG tablet Take 1 tablet by mouth 2 (two) times daily.        . Multiple Vitamin (MULTIVITAMIN) tablet Take 1 tablet by mouth daily.        [name] omeprazole (PRILOSEC) 20 MG capsule Take 20 mg by mouth daily.      . potassium chloride SA (K-DUR,KLOR-CON) 20 MEQ tablet Take 1 tablet (20 mEq total) by mouth daily.  90 tablet  3  . propranolol (INDERAL) 20 MG tablet Take 20 mg by mouth 2 (two) times daily. For migraines       . rosuvastatin (CRESTOR) 40 MG tablet Take 1 tablet (40 mg total) by mouth daily.  90 tablet  3  .  SUMAtriptan (IMITREX) 100 MG tablet Take 100 mg by mouth 2 (two) times daily.      [name] topiramate (TOPAMAX) 100 MG tablet Take 100 mg by mouth 2 (two) times daily.       . traMADol-acetaminophen (ULTRACET) 37.5-325 MG per tablet Take 1 tablet by mouth every 4 (four) hours as needed. For pain  50 tablet  2  . vitamin E 200 UNIT capsule Take 200 Units by mouth daily.       No current facility-administered medications on file prior to visit.    Allergies  Allergen Reactions  . Aspirin   . Lisinopril     REACTION: cough  . Penicillins     REACTION: rash    Family History  Problem Relation Age of Onset  . Cancer Mother     Breast Cancer, Colon Cancer  . Asthma Mother   . Depression Mother   . Bipolar disorder Mother   . Dementia Mother   . Arthritis Mother   . Hypertension Father   . Migraines Father   . Cancer Maternal Aunt   . Heart disease Maternal Grandmother   . Cancer Maternal Grandfather    BP 128/76  Pulse 76  Wt 293  lb (132.904 kg)  BMI 49.53 kg/m2  SpO2 98%  Review of Systems Denies chest pain, cough, and fever.      Objective:   Physical Exam VITAL SIGNS:  See vs page GENERAL: no distress LUNGS:  Clear to auscultation HEART:  Regular rate and rhythm without murmurs noted. Normal S1,S2.    Right knee: normal Ext: trace bilat leg edema   (i reviewed spirometry results)    Assessment & Plan:  Asthma, new OA, progressive Edema, due to venous insufficiency Dyspeptic sxs, possibly due to metformin

## 2012-06-03 ENCOUNTER — Other Ambulatory Visit: Payer: Self-pay

## 2012-06-03 ENCOUNTER — Encounter: Payer: Self-pay | Admitting: Endocrinology

## 2012-06-03 MED ORDER — FLUTICASONE-SALMETEROL 100-50 MCG/DOSE IN AEPB: 1.0000 | INHALATION_SPRAY | Freq: Two times a day (BID) | RESPIRATORY_TRACT | Status: DC

## 2012-06-28 ENCOUNTER — Encounter: Payer: Self-pay | Admitting: Endocrinology

## 2012-06-29 ENCOUNTER — Other Ambulatory Visit: Payer: Self-pay | Admitting: Endocrinology

## 2012-06-29 DIAGNOSIS — J45909 Unspecified asthma, uncomplicated: Secondary | ICD-10-CM

## 2012-07-12 ENCOUNTER — Ambulatory Visit (INDEPENDENT_AMBULATORY_CARE_PROVIDER_SITE_OTHER): Payer: Medicare Other | Admitting: Internal Medicine

## 2012-07-12 ENCOUNTER — Encounter: Payer: Self-pay | Admitting: Internal Medicine

## 2012-07-12 VITALS — BP 130/88 | HR 80 | Ht 64.0 in | Wt 295.4 lb

## 2012-07-12 DIAGNOSIS — R911 Solitary pulmonary nodule: Secondary | ICD-10-CM | POA: Diagnosis not present

## 2012-07-12 DIAGNOSIS — R0609 Other forms of dyspnea: Secondary | ICD-10-CM | POA: Diagnosis not present

## 2012-07-12 DIAGNOSIS — R0989 Other specified symptoms and signs involving the circulatory and respiratory systems: Secondary | ICD-10-CM

## 2012-07-12 DIAGNOSIS — R0600 Dyspnea, unspecified: Secondary | ICD-10-CM

## 2012-07-12 NOTE — Patient Instructions (Addendum)
#  travel - be wary of risk of blood clot to lung or heart with prolonged sitting during travel  - wear compression stockings and move yourself frequently  #shortness of breath and lung nodule  - have overnight oxygen study, ct chest and pft breathing test  #Followup - return after test less than 1 month

## 2012-07-12 NOTE — Progress Notes (Signed)
Subjective:    Patient ID: Heather [name], female    DOB: 07/23/1945, 67 y.o.   MRN: [id number] Donnalee Curry, MD   HPI   reports that she quit smoking about 33 years ago. Her smoking use included Cigarettes. She has a 40 pack-year smoking history. She has never used smokeless tobacco.  Body mass index is 50.68 kg/(m^2).   IOV 07/12/2012  Cc.  AT baseline her nose and lungs are sensitive to cooking smoke (can sneeze and cough) but never had dyspnea. However, now has NEW Dyspnea. Insidious onset. SEverl months x 6 month. EXertional dyspnea for stairs or ADLs. Improved with rest and passage of time after resting. She tried advair once but made her tachycardic and so has refused to try it again (used it for 10 days). STable course. Current dyspnea: she is unsure if smoke or weather change makes it worse. Husband strongly feels exertion is main trigger for dyspnea. THere is no associated chest pain, cough, but there is some wheeze which is only occassional. THere is no change in 4 pillow orthopnea (sleep apnea). No pnd.  No weight loss   DYspnea Relevant hx  -She is morbidly obese. SHe is at 295# wuth BMI 50.7. HAs gained 45# in last 3 year - SHe has sleep apnea and migraines - sees dR Willis  But not on cpap. Unclear why - LArge Hiatal Hernia - seen on CT 2003  And cxr 2007. GERD: has frequent symptoms -     LAbs June 2003 -> Aug 2003: 44m LLL nodule without change -> CXR 2007: No mass    06/02/12 spirometry at pcp office - sevee obstruction. FEv1 0.8L/40%, a Ratio 33   Past Medical History  Diagnosis Date  . ALLERGIC RHINITIS 10/22/2006  . ANEMIA 12/18/2008  . ASYMPTOMATIC POSTMENOPAUSAL STATUS 11/22/2007  . DIABETES MELLITUS, TYPE II 01/13/2007  . GERD 07/20/2007  . GOITER, MULTINODULAR 07/20/2007  . Headache 07/20/2007  . HEARING LOSS 11/22/2007  . HYPERCHOLESTEROLEMIA 01/13/2007  . HYPERTENSION 10/22/2006  . OSTEOARTHRITIS 10/22/2006  . Other chronic nonalcoholic liver  disease 51/74/9449 . Hyperglycemia   . NASH (nonalcoholic steatohepatitis)   . Fibroid   . Hx of colposcopy with cervical biopsy   . Abnormal Pap smear   . Complication of anesthesia      Family History  Problem Relation Age of Onset  . Cancer Mother     Breast Cancer, Colon Cancer  . Asthma Mother   . Depression Mother   . Bipolar disorder Mother   . Dementia Mother   . Arthritis Mother   . Hypertension Father   . Migraines Father   . Cancer Maternal Aunt   . Heart disease Maternal Grandmother   . Cancer Maternal Grandfather      History   Social History  . Marital Status: Married    Spouse Name: N/A    Number of Children: N/A  . Years of Education: N/A   Occupational History  . Retired    Social History Main Topics  . Smoking status: Former Smoker -- 2.00 packs/day for 20 years    Types: Cigarettes    Quit date: 03/03/1979  . Smokeless tobacco: Never Used  . Alcohol Use: Yes     Comment: occasional   . Drug Use: No  . Sexually Active: Yes    Birth Control/ Protection: Surgical   Other Topics Concern  . Not on file   Social History Narrative  . No narrative on file  Allergies  Allergen Reactions  . Aspirin   . Lisinopril     REACTION: cough  . Penicillins     REACTION: rash  . Prednisone      Outpatient Prescriptions Prior to Visit  Medication Sig Dispense Refill  . Calcium Carbonate-Vitamin D (CALCIUM 600 + D PO) Take 2 tablets by mouth daily.        . clonazePAM (KLONOPIN) 0.5 MG tablet Take 0.5 mg by mouth 2 (two) times daily as needed for anxiety.      . Colesevelam HCl (WELCHOL) 3.75 G PACK Pt normally takes 6 pills a day. Pt normally gets a 3 month supply.  30 each  3  . Ferrous Sulfate (IRON) 325 (65 FE) MG TABS Take 1 tablet by mouth daily.        . fexofenadine-pseudoephedrine (ALLEGRA-D 24) 180-240 MG per 24 hr tablet Take 1 tablet by mouth daily.  30 tablet  11  . furosemide (LASIX) 20 MG tablet Take 1 tablet (20 mg total) by mouth  daily.  90 tablet  3  . glucosamine-chondroitin 500-400 MG tablet Take 1 tablet by mouth 2 (two) times daily.        . metFORMIN (GLUCOPHAGE-XR) 500 MG 24 hr tablet Take 2 tablets (1,000 mg total) by mouth 2 (two) times daily.  360 tablet  3  . Multiple Vitamin (MULTIVITAMIN) tablet Take 1 tablet by mouth daily.        [name] omeprazole (PRILOSEC) 20 MG capsule Take 20 mg by mouth daily.      . potassium chloride SA (K-DUR,KLOR-CON) 20 MEQ tablet Take 1 tablet (20 mEq total) by mouth daily.  90 tablet  3  . propranolol (INDERAL) 20 MG tablet Take 20 mg by mouth 2 (two) times daily. For migraines       . rosuvastatin (CRESTOR) 40 MG tablet Take 1 tablet (40 mg total) by mouth daily.  90 tablet  3  . SUMAtriptan (IMITREX) 100 MG tablet Take 100 mg by mouth 2 (two) times daily.      [name] topiramate (TOPAMAX) 100 MG tablet Take 100 mg by mouth 2 (two) times daily.       . traMADol-acetaminophen (ULTRACET) 37.5-325 MG per tablet Take 1 tablet by mouth every 4 (four) hours as needed. For pain  50 tablet  2  . vitamin E 200 UNIT capsule Take 200 Units by mouth daily.      . Fluticasone-Salmeterol (ADVAIR) 100-50 MCG/DOSE AEPB Inhale 1 puff into the lungs 2 (two) times daily.  60 each  11   No facility-administered medications prior to visit.     Review of Systems  Constitutional: Negative for fever and unexpected weight change.  HENT: Negative for ear pain, nosebleeds, congestion, sore throat, rhinorrhea, sneezing, trouble swallowing, dental problem, postnasal drip and sinus pressure.   Eyes: Negative for redness and itching.  Respiratory: Positive for shortness of breath. Negative for cough, chest tightness and wheezing.   Cardiovascular: Positive for leg swelling. Negative for palpitations.  Gastrointestinal: Negative for nausea and vomiting.  Genitourinary: Negative for dysuria.  Musculoskeletal: Negative for joint swelling.  Skin: Negative for rash.  Neurological: Negative for headaches.   Hematological: Does not bruise/bleed easily.  Psychiatric/Behavioral: Negative for dysphoric mood. The patient is not nervous/anxious.        Objective:   Physical Exam  Vitals reviewed. Constitutional: She is oriented to person, place, and time. She appears well-developed and well-nourished. No distress.  Morbidly obese  HENT:  Head: Normocephalic  and atraumatic.  Right Ear: External ear normal.  Left Ear: External ear normal.  Mouth/Throat: Oropharynx is clear and moist. No oropharyngeal exudate.  Eyes: Conjunctivae and EOM are normal. Pupils are equal, round, and reactive to light. Right eye exhibits no discharge. Left eye exhibits no discharge. No scleral icterus.  Neck: Normal range of motion. Neck supple. No JVD present. No tracheal deviation present. No thyromegaly present.  Cardiovascular: Normal rate, regular rhythm, normal heart sounds and intact distal pulses.  Exam reveals no gallop and no friction rub.   No murmur heard. Pulmonary/Chest: Effort normal and breath sounds normal. No respiratory distress. She has no wheezes. She has no rales. She exhibits no tenderness.  Abdominal: Soft. Bowel sounds are normal. She exhibits no distension and no mass. There is no tenderness. There is no rebound and no guarding.  Musculoskeletal: Normal range of motion. She exhibits no edema and no tenderness.  Lymphadenopathy:    She has no cervical adenopathy.  Neurological: She is alert and oriented to person, place, and time. She has normal reflexes. No cranial nerve deficit. She exhibits normal muscle tone. Coordination normal.  Skin: Skin is warm and dry. No rash noted. She is not diaphoretic. No erythema. No pallor.  Psychiatric: She has a normal mood and affect. Her behavior is normal. Judgment and thought content normal.          Assessment & Plan:

## 2012-07-13 DIAGNOSIS — R911 Solitary pulmonary nodule: Secondary | ICD-10-CM | POA: Insufficient documentation

## 2012-07-13 NOTE — Assessment & Plan Note (Signed)
Not sure why she's having dyspnea. Certainly multifactorial etiologies possible including obesity, deconditioning, diastolic dysfunction which are common for the body profile. However with desaturation and prior prolonged smoking history and obstructive spirometry and wondering if she has severe COPD. I would normally start empiric treatment but given intolerance to Advair and her current impending travel I will just get full pulmonary function test, overnight oxygen saturation study and see her in followup. CT scan of the chest being done for pulmonary nodule done in 2003 will also be helpful in dyspnea evaluation

## 2012-07-13 NOTE — Assessment & Plan Note (Signed)
7 mm left lower lobe nodules in 2003. Now for followup scans after that. We'll do a repeat CT scan of the chest now

## 2012-08-03 ENCOUNTER — Telehealth: Payer: Self-pay | Admitting: Internal Medicine

## 2012-08-03 ENCOUNTER — Ambulatory Visit (INDEPENDENT_AMBULATORY_CARE_PROVIDER_SITE_OTHER)
Admission: RE | Admit: 2012-08-03 | Discharge: 2012-08-03 | Disposition: A | Discharge status: Home / Self Care | Payer: Medicare Other | Source: Ambulatory Visit | Attending: Internal Medicine | Admitting: Internal Medicine

## 2012-08-03 DIAGNOSIS — R911 Solitary pulmonary nodule: Secondary | ICD-10-CM | POA: Diagnosis not present

## 2012-08-03 DIAGNOSIS — R0609 Other forms of dyspnea: Secondary | ICD-10-CM | POA: Diagnosis not present

## 2012-08-03 DIAGNOSIS — R0600 Dyspnea, unspecified: Secondary | ICD-10-CM

## 2012-08-03 DIAGNOSIS — R0989 Other specified symptoms and signs involving the circulatory and respiratory systems: Secondary | ICD-10-CM

## 2012-08-03 NOTE — Telephone Encounter (Signed)
  CT shows   - nodule  - no ild - Co calcification - hiatal hernia  Will discuss 08/23/12 visit    Ct Chest Wo Contrast  08/03/2012   *RADIOLOGY REPORT*  Clinical Data: Shortness of breath with exertion.  Evaluate for interstitial lung disease.  CT CHEST WITHOUT CONTRAST  Technique:  Multidetector CT imaging of the chest was performed following the standard protocol without IV contrast.  Comparison: None.  Findings: Right lobe of the thyroid is enlarged and likely contains a nodule, measuring approximate 2.4 x 4.1 cm.  There is leftward deviation of the trachea which remains patent.  No pathologically enlarged mediastinal or axillary lymph nodes.  Hilar regions are difficult to definitively evaluate without IV contrast.  Coronary artery calcification.  Heart size normal.  No pericardial effusion. Very large hiatal hernia containing stomach and transverse colon.  Scarring and volume loss in the medial right lower lobe with minimal involvement of the right middle lobe.  A 6 mm nodule is seen in the left lower lobe (image 23).  High resolution imaging of the lungs shows minimal subpleural linear densities along the lateral aspects of the lung bases.  No traction bronchiectasis/bronchiolectasis, architectural distortion or honeycombing.  No air trapping on inspiratory expiratory imaging. No pleural fluid.  Airway is otherwise unremarkable.  IMPRESSION:  1.  No definitive evidence of interstitial lung disease.  Minimal linear opacification in the subpleural regions of the lung bases may be due to scarring. 2.  Small left lower lobe nodule. If the patient is at high risk for bronchogenic carcinoma, follow-up chest CT at 6-12 months is recommended.  If the patient is at low risk for bronchogenic carcinoma, follow-up chest CT at 12 months is recommended.  This recommendation follows the consensus statement: Guidelines for Management of Small Pulmonary Nodules Detected on CT Scans: A Statement from the [place] as published in Radiology 2005; 237:395-400.  3.  Left anterior descending coronary artery calcification. 4.  Large hiatal hernia containing stomach and colon. 5.  Asymmetrically enlarged and nodular right thyroid, previously imaged on 08/19/2007.   Original Report Authenticated By: Lorin Picket, M.D.

## 2012-08-04 DIAGNOSIS — R0989 Other specified symptoms and signs involving the circulatory and respiratory systems: Secondary | ICD-10-CM | POA: Diagnosis not present

## 2012-08-16 ENCOUNTER — Telehealth: Payer: Self-pay | Admitting: Internal Medicine

## 2012-08-16 NOTE — Telephone Encounter (Signed)
Overnight oxygen saturation test done 08/04/2012 shows pulse ox less than 88% for 5 hours and 55 minutes and 32 seconds which is 72% of the sleep awake low was 80% awake high was 92%. Overall high was 98% overall low 61%. Continue stamina saturation of less than 80% was 34 minutes and 52 seconds.  I will address these results on 08/24/2012 when I see her for followup

## 2012-08-23 ENCOUNTER — Encounter: Payer: Self-pay | Admitting: Internal Medicine

## 2012-08-24 ENCOUNTER — Telehealth: Payer: Self-pay | Admitting: Internal Medicine

## 2012-08-24 ENCOUNTER — Encounter: Payer: Self-pay | Admitting: Internal Medicine

## 2012-08-24 ENCOUNTER — Ambulatory Visit (INDEPENDENT_AMBULATORY_CARE_PROVIDER_SITE_OTHER): Payer: Medicare Other | Admitting: Internal Medicine

## 2012-08-24 VITALS — BP 138/84 | HR 75 | Temp 96.8°F | Ht 64.0 in | Wt 284.0 lb

## 2012-08-24 DIAGNOSIS — I2584 Coronary atherosclerosis due to calcified coronary lesion: Secondary | ICD-10-CM

## 2012-08-24 DIAGNOSIS — R911 Solitary pulmonary nodule: Secondary | ICD-10-CM | POA: Diagnosis not present

## 2012-08-24 DIAGNOSIS — R0600 Dyspnea, unspecified: Secondary | ICD-10-CM

## 2012-08-24 DIAGNOSIS — I2510 Atherosclerotic heart disease of native coronary artery without angina pectoris: Secondary | ICD-10-CM

## 2012-08-24 DIAGNOSIS — K449 Diaphragmatic hernia without obstruction or gangrene: Secondary | ICD-10-CM | POA: Insufficient documentation

## 2012-08-24 DIAGNOSIS — R0609 Other forms of dyspnea: Secondary | ICD-10-CM

## 2012-08-24 DIAGNOSIS — R0989 Other specified symptoms and signs involving the circulatory and respiratory systems: Secondary | ICD-10-CM | POA: Diagnosis not present

## 2012-08-24 LAB — PULMONARY FUNCTION TEST

## 2012-08-24 NOTE — Assessment & Plan Note (Signed)
She is asymptomatic. She is not interesed in surgical opton

## 2012-08-24 NOTE — Assessment & Plan Note (Signed)
80m nodule desdcribled CT June 2014 but in 2003 there is 770mnodule same lobe describle. I will ask radiology to compare and if truly uncanged no further ct needed

## 2012-08-24 NOTE — Progress Notes (Signed)
PFT done today. 

## 2012-08-24 NOTE — Patient Instructions (Addendum)
#  Coronary artery calcification  - see Dr Aundra Dubin   #Lung nodule - 53m LLL  - radiologist recommended followup scan in 9 months but I did note that [place] I will double check with radiologist. IF was present in 2003, then no more scan needed  #Overnight oxygen desaturation   pleaes talk to Dr WJannifer Franklinabout retest for sleep apnea and starting CPAP treatment; you desaturate a lot   #Followup  1-2 months to regroup

## 2012-08-24 NOTE — Telephone Encounter (Signed)
If there is delay seeing [place] [facility] let me know. Patient has alternative to see Dr Einar Gip if significant delays at [place]   Dr. Brand Males, M.D., [facility].C.P Pulmonary and Critical Care Medicine Staff Physician [facility] Pager: [phone], If no answer or between  15:00h - 7:00h: call [phone]  08/24/2012 11:01 PM

## 2012-08-24 NOTE — Assessment & Plan Note (Addendum)
I think is due to obesity and possibly fatigue from untreated sleep apnea. Due to Co art calcification will need to ensure no angina equivalent. REfer Dr [name] (husband's cardiologist). Also, need to re-eval sleep; she will take this up with Dr Jannifer Franklin neurologist

## 2012-08-24 NOTE — Progress Notes (Signed)
Subjective:    Patient ID: Heather [name], female    DOB: June 22, 1945, 67 y.o.   MRN: [id number]  HPI   IOV 07/12/2012  Cc.  AT baseline her nose and lungs are sensitive to cooking smoke (can sneeze and cough) but never had dyspnea. However, now has NEW Dyspnea. Insidious onset. SEverl months x 6 month. EXertional dyspnea for stairs or ADLs. Improved with rest and passage of time after resting. She tried advair once but made her tachycardic and so has refused to try it again (used it for 10 days). STable course. Current dyspnea: she is unsure if smoke or weather change makes it worse. Husband strongly feels exertion is main trigger for dyspnea. THere is no associated chest pain, cough, but there is some wheeze which is only occassional. THere is no change in 4 pillow orthopnea (sleep apnea). No pnd.  No weight loss   DYspnea Relevant hx  -She is morbidly obese. SHe is at 295# wuth BMI 50.7. HAs gained 45# in last 3 year - SHe has sleep apnea and migraines - sees dR Willis  But not on cpap. Unclear why not on CPAP - LArge Hiatal Hernia - seen on CT 2003  And cxr 2007. GERD: has frequent symptoms - Ex smoker - 40 pack. Quit in 80s    LAbs June 2003 -> Aug 2003 CT chest: 32m LLL nodule without change -> CXR 2007: No mass    06/02/12 spirometry at pcp office - sevee obstruction. FEv1 0.8L/40%, a Ratio 33  REC #travel  - be wary of risk of blood clot to lung or heart with prolonged sitting during travel  - wear compression stockings and move yourself frequently  #shortness of breath and lung nodule  - have overnight oxygen study, ct chest and pft breathing test  #Followup  - return after test less than 1 month    OV 08/24/2012 Fu fo rresults for dyspnea workup. No new isues. Dyspnea stable and unchanged   Overnight oxygen saturation test done 08/04/2012 shows pulse ox less than 88% for 5 hours and 55 minutes and 32 seconds which is 72% of the sleep awake low was 80% awake high was  92%. Overall high was 98% overall low 61%. Continuous saturation of less than 80% was 34 minutes and 52 seconds. She is still not sure why neurologist did not start her on CPAP and why she is not on nocturnal o2   PFT 08/24/12: Fev` 1.2L/61%, No BD response., RAtio 82/104%, Small airway 70%, TLC 61%, DLCO 20/82%. - restriction with normal DLCO    CT shows chest 08/03/12 thyroid nodule 4.1cm; same as in 2009 CT , patient aware of it and follows with pcp, recollects bx   - nodule 652mLL  - no ild - Co calcification - hiatal hernia - very large     Review of Systems  Constitutional: Negative for fever and unexpected weight change.  HENT: Negative for ear pain, nosebleeds, congestion, sore throat, rhinorrhea, sneezing, trouble swallowing, dental problem, postnasal drip and sinus pressure.   Eyes: Negative for redness and itching.  Respiratory: Positive for shortness of breath. Negative for cough, chest tightness and wheezing.   Cardiovascular: Negative for palpitations and leg swelling.  Gastrointestinal: Negative for nausea and vomiting.  Genitourinary: Negative for dysuria.  Musculoskeletal: Negative for joint swelling.  Skin: Negative for rash.  Neurological: Negative for headaches.  Hematological: Does not bruise/bleed easily.  Psychiatric/Behavioral: Negative for dysphoric mood. The patient is not nervous/anxious.  uous     Objective:   Physical Exam Vitals reviewed. Constitutional: She is oriented to person, place, and time. She appears well-developed and well-nourished. No distress.  Morbidly obese  HENT:  Head: Normocephalic and atraumatic.  Right Ear: External ear normal.  Left Ear: External ear normal.  Mouth/Throat: Oropharynx is clear and moist. No oropharyngeal exudate.  Eyes: Conjunctivae and EOM are normal. Pupils are equal, round, and reactive to light. Right eye exhibits no discharge. Left eye exhibits no discharge. No scleral icterus.  Neck: Normal range of  motion. Neck supple. No JVD present. No tracheal deviation present. No thyromegaly present.  Cardiovascular: Normal rate, regular rhythm, normal heart sounds and intact distal pulses.  Exam reveals no gallop and no friction rub.   No murmur heard. Pulmonary/Chest: Effort normal and breath sounds normal. No respiratory distress. She has no wheezes. She has no rales. She exhibits no tenderness.  Abdominal: Soft. Bowel sounds are normal. She exhibits no distension and no mass. There is no tenderness. There is no rebound and no guarding.  Musculoskeletal: Normal range of motion. She exhibits no edema and no tenderness.  Lymphadenopathy:    She has no cervical adenopathy.  Neurological: She is alert and oriented to person, place, and time. She has normal reflexes. No cranial nerve deficit. She exhibits normal muscle tone. Coordination normal.  Skin: Skin is warm and dry. No rash noted. She is not diaphoretic. No erythema. No pallor.  Psychiatric: She has a normal mood and affect. Her behavior is normal. Judgment and thought content normal.           Assessment & Plan:

## 2012-08-25 NOTE — Telephone Encounter (Signed)
[facility] advised that if there is a delay at [facility] then pt to see [facility]. [facility] will contact the pt. Dumas Bing, CMA

## 2012-09-09 ENCOUNTER — Encounter: Payer: Self-pay | Admitting: Neurology

## 2012-09-09 ENCOUNTER — Ambulatory Visit (INDEPENDENT_AMBULATORY_CARE_PROVIDER_SITE_OTHER): Payer: Medicare Other | Admitting: Neurology

## 2012-09-09 VITALS — BP 112/77 | HR 69 | Ht 64.25 in | Wt 284.0 lb

## 2012-09-09 DIAGNOSIS — G479 Sleep disorder, unspecified: Secondary | ICD-10-CM | POA: Diagnosis not present

## 2012-09-09 DIAGNOSIS — G43009 Migraine without aura, not intractable, without status migrainosus: Secondary | ICD-10-CM

## 2012-09-09 DIAGNOSIS — G4730 Sleep apnea, unspecified: Secondary | ICD-10-CM

## 2012-09-09 DIAGNOSIS — R404 Transient alteration of awareness: Secondary | ICD-10-CM

## 2012-09-09 MED ORDER — SUMATRIPTAN 20 MG/ACT NA SOLN: 1.0000 | NASAL | Status: DC | PRN

## 2012-09-09 NOTE — Progress Notes (Signed)
Reason for visit: Migraine  Heather [name] is an 67 y.o. female  History of present illness:  Ms. Oboyle is a 67 year old right-handed black female with a history of obesity and migraine headache. The patient indicates that as long as she takes her Topamax and her propranolol, and she does not have migraine headaches. The patient may forget her medications for one or 2 days, and then she will get a migraine headache that is quite severe, and difficult to control. The patient has Imitrex tablets, but she indicates that she does not take this medication. The patient is more likely to have a headache if it is raining. The patient overall is doing fairly well. The patient returns for an evaluation.  Past Medical History  Diagnosis Date  . ALLERGIC RHINITIS 10/22/2006  . ANEMIA 12/18/2008  . ASYMPTOMATIC POSTMENOPAUSAL STATUS 11/22/2007  . DIABETES MELLITUS, TYPE II 01/13/2007  . GERD 07/20/2007  . GOITER, MULTINODULAR 07/20/2007  . Headache(784.0) 07/20/2007  . HEARING LOSS 11/22/2007  . HYPERCHOLESTEROLEMIA 01/13/2007  . HYPERTENSION 10/22/2006  . OSTEOARTHRITIS 10/22/2006  . Other chronic nonalcoholic liver disease 7/00/1749  . Hyperglycemia   . NASH (nonalcoholic steatohepatitis)   . Fibroid   . Hx of colposcopy with cervical biopsy   . Abnormal Pap smear   . Complication of anesthesia   . Dyslipidemia   . Obesity   . Obstructive sleep apnea   . Degenerative arthritis     Past Surgical History  Procedure Laterality Date  . Esophagogastroduodenoscopy  12/08/2005  . Stress cardiolite  10/21/2005  . Electrocardiogram  10/15/2006  . Dexa  08/2005  . Wisdom tooth extraction    . Breast surgery      Breast reduction  . Sweat gland removal    . Dilation and curettage of uterus    . Eye surgery      Family History  Problem Relation Age of Onset  . Cancer Mother     Breast Cancer, Colon Cancer  . Asthma Mother   . Depression Mother   . Bipolar disorder Mother   . Dementia  Mother   . Arthritis Mother   . Hypertension Father   . Migraines Father   . Cancer Maternal Aunt   . Heart disease Maternal Grandmother   . Cancer Maternal Grandfather     Social history:  reports that she quit smoking about 33 years ago. Her smoking use included Cigarettes. She has a 40 pack-year smoking history. She has never used smokeless tobacco. She reports that  drinks alcohol. She reports that she does not use illicit drugs.  Allergies:  Allergies  Allergen Reactions  . Aspirin   . Lisinopril     REACTION: cough  . Penicillins     REACTION: rash  . Prednisone     Medications:  Current Outpatient Prescriptions on File Prior to Visit  Medication Sig Dispense Refill  . Calcium Carbonate-Vitamin D (CALCIUM 600 + D PO) Take 2 tablets by mouth daily.        . Colesevelam HCl (WELCHOL) 3.75 G PACK Pt normally takes 6 pills a day. Pt normally gets a 3 month supply.  30 each  3  . Ferrous Sulfate (IRON) 325 (65 FE) MG TABS Take 1 tablet by mouth daily.        . fexofenadine-pseudoephedrine (ALLEGRA-D 24) 180-240 MG per 24 hr tablet Take 1 tablet by mouth daily.  30 tablet  11  . furosemide (LASIX) 20 MG tablet Take 1 tablet (20 mg  total) by mouth daily.  90 tablet  3  . glucosamine-chondroitin 500-400 MG tablet Take 1 tablet by mouth 2 (two) times daily.        . metFORMIN (GLUCOPHAGE-XR) 500 MG 24 hr tablet Take 2 tablets (1,000 mg total) by mouth 2 (two) times daily.  360 tablet  3  . Multiple Vitamin (MULTIVITAMIN) tablet Take 1 tablet by mouth daily.        [name] omeprazole (PRILOSEC) 20 MG capsule Take 20 mg by mouth daily as needed.       . potassium chloride SA (K-DUR,KLOR-CON) 20 MEQ tablet Take 1 tablet (20 mEq total) by mouth daily.  90 tablet  3  . propranolol (INDERAL) 20 MG tablet Take 20 mg by mouth 2 (two) times daily. For migraines       . rosuvastatin (CRESTOR) 40 MG tablet Take 1 tablet (40 mg total) by mouth daily.  90 tablet  3  . topiramate (TOPAMAX) 100 MG  tablet Take 100 mg by mouth 2 (two) times daily.       . vitamin E 200 UNIT capsule Take 200 Units by mouth daily.       No current facility-administered medications on file prior to visit.    ROS:  Out of a complete 14 system review of symptoms, the patient complains only of the following symptoms, and all other reviewed systems are negative.  Birthmarks Shortness of breath Headache  Blood pressure 112/77, pulse 69, height 5' 4.25" (1.632 m), weight 284 lb (128.822 kg).  Physical Exam  General: The patient is alert and cooperative at the time of the examination. The patient is markedly obese.  Skin: 1-2+ edema is seen at ankles bilaterally.   Neurologic Exam  Cranial nerves: Facial symmetry is present. Speech is normal, no aphasia or dysarthria is noted. Extraocular movements are full. Visual fields are full.  Motor: The patient has good strength in all 4 extremities.  Coordination: The patient has good finger-nose-finger and heel-to-shin bilaterally.  Gait and station: The patient has a normal gait. Tandem gait is unsteady. Romberg is negative. No drift is seen.  Reflexes: Deep tendon reflexes are symmetric.   Assessment/Plan:  One. Migraine headache  2. Obesity  The patient will continue the Topamax and propranolol. The patient is having some word finding problems on the Topamax. The patient will be given a trial on Imitrex nasal spray, she did not wish to use the injectable form of this medication. The patient apparently has been noted to have oxygen desaturations at night, and a sleep study was recommended. I will get this set up for the patient.  Jill Alexanders MD 09/11/2012 7:14 PM  [facility] [address] [place], [place] [address]  Phone [phone] Fax [phone]

## 2012-09-22 ENCOUNTER — Other Ambulatory Visit: Payer: Self-pay | Admitting: *Deleted

## 2012-09-22 DIAGNOSIS — R0602 Shortness of breath: Secondary | ICD-10-CM | POA: Diagnosis not present

## 2012-09-22 DIAGNOSIS — I2510 Atherosclerotic heart disease of native coronary artery without angina pectoris: Secondary | ICD-10-CM | POA: Diagnosis not present

## 2012-09-22 DIAGNOSIS — I2584 Coronary atherosclerosis due to calcified coronary lesion: Secondary | ICD-10-CM | POA: Diagnosis not present

## 2012-09-22 DIAGNOSIS — E119 Type 2 diabetes mellitus without complications: Secondary | ICD-10-CM | POA: Diagnosis not present

## 2012-09-22 MED ORDER — COLESEVELAM HCL 625 MG PO TABS: ORAL_TABLET | ORAL | Status: DC

## 2012-09-29 ENCOUNTER — Encounter: Payer: Self-pay | Admitting: Endocrinology

## 2012-09-30 ENCOUNTER — Telehealth: Payer: Self-pay | Admitting: *Deleted

## 2012-09-30 MED ORDER — COLESEVELAM HCL 3.75 G PO PACK: PACK | ORAL | Status: DC

## 2012-09-30 NOTE — Telephone Encounter (Signed)
Called pt and let her know sending in to her pharmacy.

## 2012-09-30 NOTE — Telephone Encounter (Signed)
Please send WelChol packets instead of her tablets as requested, 1 packet daily, 30 packets with 3 refills

## 2012-10-05 ENCOUNTER — Other Ambulatory Visit: Payer: Self-pay

## 2012-10-07 DIAGNOSIS — R0602 Shortness of breath: Secondary | ICD-10-CM | POA: Diagnosis not present

## 2012-10-13 DIAGNOSIS — R0602 Shortness of breath: Secondary | ICD-10-CM | POA: Diagnosis not present

## 2012-10-13 DIAGNOSIS — I10 Essential (primary) hypertension: Secondary | ICD-10-CM | POA: Diagnosis not present

## 2012-10-13 DIAGNOSIS — I2510 Atherosclerotic heart disease of native coronary artery without angina pectoris: Secondary | ICD-10-CM | POA: Diagnosis not present

## 2012-10-14 DIAGNOSIS — E119 Type 2 diabetes mellitus without complications: Secondary | ICD-10-CM | POA: Diagnosis not present

## 2012-10-14 DIAGNOSIS — I10 Essential (primary) hypertension: Secondary | ICD-10-CM | POA: Diagnosis not present

## 2012-10-14 DIAGNOSIS — R0602 Shortness of breath: Secondary | ICD-10-CM | POA: Diagnosis not present

## 2012-10-25 DIAGNOSIS — R0602 Shortness of breath: Secondary | ICD-10-CM | POA: Diagnosis not present

## 2012-10-25 DIAGNOSIS — I2584 Coronary atherosclerosis due to calcified coronary lesion: Secondary | ICD-10-CM | POA: Diagnosis not present

## 2012-10-25 DIAGNOSIS — I2510 Atherosclerotic heart disease of native coronary artery without angina pectoris: Secondary | ICD-10-CM | POA: Diagnosis not present

## 2012-10-25 DIAGNOSIS — E119 Type 2 diabetes mellitus without complications: Secondary | ICD-10-CM | POA: Diagnosis not present

## 2012-11-06 ENCOUNTER — Encounter: Payer: Self-pay | Admitting: Neurology

## 2012-11-08 ENCOUNTER — Telehealth: Payer: Self-pay | Admitting: Neurology

## 2012-11-08 ENCOUNTER — Encounter: Payer: Self-pay | Admitting: Neurology

## 2012-11-08 DIAGNOSIS — G479 Sleep disorder, unspecified: Secondary | ICD-10-CM

## 2012-11-08 NOTE — Telephone Encounter (Signed)
I called patient. I will make a referral to one of our sleep doctors.

## 2012-11-14 ENCOUNTER — Ambulatory Visit (INDEPENDENT_AMBULATORY_CARE_PROVIDER_SITE_OTHER): Payer: Medicare Other | Admitting: Neurology

## 2012-11-14 ENCOUNTER — Encounter: Payer: Self-pay | Admitting: Neurology

## 2012-11-14 VITALS — BP 108/68 | HR 90 | Resp 18 | Ht 64.0 in | Wt 282.0 lb

## 2012-11-14 DIAGNOSIS — G4710 Hypersomnia, unspecified: Secondary | ICD-10-CM

## 2012-11-14 DIAGNOSIS — E662 Morbid (severe) obesity with alveolar hypoventilation: Secondary | ICD-10-CM

## 2012-11-14 NOTE — Progress Notes (Addendum)
[facility]  Provider:  Larey Seat, M D  Referring Provider: Kathrynn Ducking, MD Primary Care Physician:  [name], MD  Chief Complaint  Patient presents with  . Insomnia    HPI:  Heather [name] is a 67 y.o. female  Is seen here as a referral  from Dr. Jannifer Franklin for sleep medicine consultation .  Heather [name], a  67 year old right-handed [place] American female , is a former patient of Dr. Elvia Collum and is followed now by Dr. Floyde Parkins here at [facility].  She was referred by Dr. Doy Mince in June 2011 for a sleep study based on chief complaints of snoring, witnessed apneas and excessive daytime sleepiness. At the time the patient endorsed  the Epworth sleepiness score at a very high count of 20/24 points and the Beck's inventory at 3 points, her  BMI was 49.9, and her neck conference measured 14.5 inches .  She was diagnosed with OSA after her study revealed an AHI of 11.7 (which constitutes mild apnea). During REM sleep, her AHI increased to 68.2/hr.   The oxygen  desaturations associated with REM sleep in supine sleep position  were suspected to be a cause for the patient's excessive daytime sleepiness and the reported morning headaches.  The patient had reported at that time that she would prefer to sleep up sitting in a chair or recliner. She never was titrated to CPAP or followed up on CPAP titration.  The patient's pulmonologist, Dr. Chase Caller, recommended a review of her sleep breathing disorder in light of a new  diagnosis of nocturnal hypoxemia. The patient desaturates according to her her husband and from recollection of ONO data  into the 60s and 70s at night ( as documented on a home based  pulse oximetry). Cardiologist evaluation  found hypoxemia . The patient started per cardiologist on Losartin and ASA.   Her weight has been stable over the last 3 years , since her last  PSG.  Her sleep habits are as follows: She  initiates sleep around 1-1.30 AM, promptly falls asleep. Wakes up 3-5 times with nocturia and continues to  sleep reclined.   She will finally rise at  6-7 AM , often with a dull, global  Headache, but  she feels restored.  Has a dry mouth ,assumed to be caused by snoring.  Around 10 AM she feels sleepy again and often naps. She falls regularly asleep at her computer or  whenever physicially inactive and not stimulated. She sleep talks but does not leave the bed , no walking eating , not thrashing and no PLMs reported .  Dry mouth is extremely bothersome,  Dry eyes, she tends to move slow as not to get dizzy. She has felt impaired for many reasons that are not clearly related to sleep.   She underwent a breast reduction but no airway , facial , nasal surgery. There is no history of brain trauma or neck- airway trauma.     ROS : She has been seen by Dr. Jannifer Franklin in July 2014 ,6 month earlier.  His notes in Epic did not mention  her EDS or apnea history and the patient did not endorse related symptoms to him. Review of Systems: Out of a complete 14 system review, the patient complains of only the following symptoms, and all other reviewed systems are negative. The patient has SOB,  Pain in groin, Hernia, is morbidly obese, has CHF and ankle edema.  Epworth is  21,  FSS 54, GDS at 2 points  ,  Fall risk at  8 points   History   Social History  . Marital Status: Married    Spouse Name: [name]    Number of Children: 0  . Years of Education: College   Occupational History  . Retired    Social History Main Topics  . Smoking status: Former Smoker -- 2.00 packs/day for 20 years    Types: Cigarettes    Quit date: 03/03/1979  . Smokeless tobacco: Never Used  . Alcohol Use: Yes     Comment: occasional   . Drug Use: No  . Sexual Activity: Yes    Birth Control/ Protection: Surgical   Other Topics Concern  . Not on file   Social History Narrative   Patient lives at home with family.    Caffeine Use: 2 soda every other day    Family History  Problem Relation Age of Onset  . Cancer Mother     Breast Cancer, Colon Cancer  . Asthma Mother   . Depression Mother   . Bipolar disorder Mother   . Dementia Mother   . Arthritis Mother   . Hypertension Father   . Migraines Father   . Cancer Maternal Aunt   . Heart disease Maternal Grandmother   . Cancer Maternal Grandfather     Past Medical History  Diagnosis Date  . ALLERGIC RHINITIS 10/22/2006  . ANEMIA 12/18/2008  . ASYMPTOMATIC POSTMENOPAUSAL STATUS 11/22/2007  . DIABETES MELLITUS, TYPE II 01/13/2007  . GERD 07/20/2007  . GOITER, MULTINODULAR 07/20/2007  . Headache(784.0) 07/20/2007  . HEARING LOSS 11/22/2007  . HYPERCHOLESTEROLEMIA 01/13/2007  . HYPERTENSION 10/22/2006  . OSTEOARTHRITIS 10/22/2006  . Other chronic nonalcoholic liver disease 7/82/9562  . Hyperglycemia   . NASH (nonalcoholic steatohepatitis)   . Fibroid   . Hx of colposcopy with cervical biopsy   . Abnormal Pap smear   . Complication of anesthesia   . Dyslipidemia   . Obesity   . Obstructive sleep apnea   . Degenerative arthritis     Past Surgical History  Procedure Laterality Date  . Esophagogastroduodenoscopy  12/08/2005  . Stress cardiolite  10/21/2005  . Electrocardiogram  10/15/2006  . Dexa  08/2005  . Wisdom tooth extraction    . Breast surgery      Breast reduction  . Sweat gland removal    . Dilation and curettage of uterus    . Eye surgery      Current Outpatient Prescriptions  Medication Sig Dispense Refill  . acetaminophen (TYLENOL) 500 MG tablet Take 500 mg by mouth 3 (three) times daily as needed for pain.      . Calcium Carbonate-Vitamin D (CALCIUM 600 + D PO) Take 2 tablets by mouth daily.        . clopidogrel (PLAVIX) 75 MG tablet Take 75 mg by mouth daily.      . Colesevelam HCl (WELCHOL) 3.75 G PACK Use 1 packet daily.  30 each  3  . Ferrous Sulfate (IRON) 325 (65 FE) MG TABS Take 1 tablet by mouth daily.        .  fexofenadine-pseudoephedrine (ALLEGRA-D 24) 180-240 MG per 24 hr tablet Take 1 tablet by mouth daily.  30 tablet  11  . furosemide (LASIX) 20 MG tablet Take 1 tablet (20 mg total) by mouth daily.  90 tablet  3  . glucosamine-chondroitin 500-400 MG tablet Take 1 tablet by mouth 2 (two) times daily.        [name]  losartan (COZAAR) 25 MG tablet Take 25 mg by mouth daily.      . metFORMIN (GLUCOPHAGE-XR) 500 MG 24 hr tablet Take 2 tablets (1,000 mg total) by mouth 2 (two) times daily.  360 tablet  3  . Multiple Vitamin (MULTIVITAMIN) tablet Take 1 tablet by mouth daily.        . naproxen sodium (ANAPROX) 220 MG tablet Take 220 mg by mouth 2 (two) times daily between meals as needed.      [name] omeprazole (PRILOSEC) 20 MG capsule Take 20 mg by mouth daily as needed.       . potassium chloride SA (K-DUR,KLOR-CON) 20 MEQ tablet Take 1 tablet (20 mEq total) by mouth daily.  90 tablet  3  . propranolol (INDERAL) 20 MG tablet Take 20 mg by mouth 2 (two) times daily. For migraines       . rosuvastatin (CRESTOR) 40 MG tablet Take 1 tablet (40 mg total) by mouth daily.  90 tablet  3  . SUMAtriptan (IMITREX) 20 MG/ACT nasal spray Place 1 spray (20 mg total) into the nose every 2 (two) hours as needed for migraine.  2 Inhaler  5  . topiramate (TOPAMAX) 100 MG tablet Take 100 mg by mouth 2 (two) times daily.       . vitamin E 200 UNIT capsule Take 200 Units by mouth daily.       No current facility-administered medications for this visit.    Allergies as of 11/14/2012 - Review Complete 11/14/2012  Allergen Reaction Noted  . Aspirin  08/04/2011  . Lisinopril  06/03/2006  . Penicillins  06/03/2006  . Prednisone  07/12/2012    Vitals: BP 108/68  Pulse 90  Resp 18  Ht 5' 4"  (1.626 m)  Wt 282 lb (127.914 kg)  BMI 48.38 kg/m2 Last Weight:  Wt Readings from Last 1 Encounters:  11/14/12 282 lb (127.914 kg)   Last Height:   Ht Readings from Last 1 Encounters:  11/14/12 5' 4"  (1.626 m)    Physical  exam:  General: The patient is awake, alert and appears not in acute distress. The patient is well groomed. Head: Normocephalic, atraumatic. Neck is supple. Mallampati 3 , neck circumference: 15.5 , no nasal deviation but slightly nasal voice.  Cardiovascular:  Regular rate and rhythm , without  murmurs or carotid bruit, and without distended neck veins. Respiratory: Lungs are clear to auscultation. Skin:  With evidence of  Ankle edema 2 plus , no rash. Trunk: BMI is significantly  elevated . The  patient  has normal posture.  Neurologic exam : The patient is awake and alert, oriented to place and time.  Memory subjective  described as intact. There is a normal attention span & concentration ability. Speech is fluent without  dysarthria, dysphonia or aphasia. Mood and affect are appropriate.  Cranial nerves: No loss of taste and smell, Pupils are equal and briskly reactive to light. Funduscopic exam without  evidence of pallor or edema. She is status post cataract ,  Extraocular movements  in vertical and horizontal planes intact and without nystagmus. Visual fields by finger perimetry are intact. Hearing to finger rub intact.  Facial sensation intact to fine touch. Facial motor strength is symmetric and tongue and uvula move midline.  Motor exam:   Reduced muscle tone, no rigor, and no cog-wheeling. The patient is morbidly obese - the muscle bulk is difficult to assess-  Evidence of symmetrically  reduced proximal muscle strength in all extremities. (Thigh- upper  arm)   Sensory:  Fine touch, pinprick and vibration were tested in all extremities. Proprioception is normal.  Coordination: Rapid alternating movements in the fingers/hands is tested and normal.  Finger-to-nose maneuver tested and normal without evidence of ataxia, dysmetria or tremor.  Gait and station: Patient walks without assistive device - her  Stance is wide based ,  Tandem gait is fragmented. She has trouble turning, has   trouble managing stairs , one foot at a time.   The patient walks deliberately slow and measured. Romberg testing is positive , she sways to the right and forward, this propulsive trend is  not present when her  eyes  are open .   Reported no fallls in 12 month.   Deep tendon reflexes: in the  upper and lower extremities are symmetrically attenuated.  Babinski maneuver response is  downgoing.   Assessment:  After physical and neurologic examination, review of sleep studies, and pre-existing records, assessment is that of a patient with morbid obesity , morning headaches and known OSA - 3 years ago at a mild degree.  Since than she gained weight and lost muscle tone. Her headaches have been treated by Dr Jannifer Franklin. She has documented  Edema, SOB and hypoxia at night and her cardiologist requested  this re -evaluation of apnea degree, character and therapy.   Plan:  Treatment plan and additional workup :  1)order SPLIT , PSG with 4% score and split at 15 , O2 and CO2 need to be determined exactly.  2)BMI reduction  to be addressed with her PCP and endocrinologist.  3) exercise regimen to be established- the patient seems to be deconditioned.  4) If sleep study finds this patient to have hypoxemia without apnea, will need to document the effect of oxygen to be able to prescribe it.  5) Dizziness may be provoked by a beta blocker effect-  Could this patient use an XR form at night only ?   Cc Chalmers Guest.

## 2012-11-14 NOTE — Patient Instructions (Signed)
Exercise to Lose Weight Exercise and a healthy diet may help you lose weight. Your doctor may suggest specific exercises. EXERCISE IDEAS AND TIPS  Choose low-cost things you enjoy doing, such as walking, bicycling, or exercising to workout videos.  Take stairs instead of the elevator.  Walk during your lunch break.  Park your car further away from work or school.  Go to a gym or an exercise class.  Start with 5 to 10 minutes of exercise each day. Build up to 30 minutes of exercise 4 to 6 days a week.  Wear shoes with good support and comfortable clothes.  Stretch before and after working out.  Work out until you breathe harder and your heart beats faster.  Drink extra water when you exercise.  Do not do so much that you hurt yourself, feel dizzy, or get very short of breath. Exercises that burn about 150 calories:  Running 1  miles in 15 minutes.  Playing volleyball for 45 to 60 minutes.  Washing and waxing a car for 45 to 60 minutes.  Playing touch football for 45 minutes.  Walking 1  miles in 35 minutes.  Pushing a stroller 1  miles in 30 minutes.  Playing basketball for 30 minutes.  Raking leaves for 30 minutes.  Bicycling 5 miles in 30 minutes.  Walking 2 miles in 30 minutes.  Dancing for 30 minutes.  Shoveling snow for 15 minutes.  Swimming laps for 20 minutes.  Walking up stairs for 15 minutes.  Bicycling 4 miles in 15 minutes.  Gardening for 30 to 45 minutes.  Jumping rope for 15 minutes.  Washing windows or floors for 45 to 60 minutes. Document Released: 03/21/2010 Document Revised: 05/11/2011 Document Reviewed: 03/21/2010 [facility] Patient Information 2014 [place], [place]. Polysomnography (Sleep Studies) Polysomnography (PSG) is a series of tests used for detecting (diagnosing) obstructive sleep apnea and other sleep disorders. The tests measure how some parts of your body are working while you are sleeping. The tests are extensive and  expensive. They are done in a sleep lab or hospital, and vary from center to center. Your caregiver may perform other more simple sleep studies and questionnaires before doing more complete and involved testing. Testing may not be covered by insurance. Some of these tests are:  An EEG (Electroencephalogram). This tests your brain waves and stages of sleep.  An EOG (Electrooculogram). This measures the movements of your eyes. It detects periods of REM (rapid eye movement) sleep, which is your dream sleep.  An EKG (Electrocardiogram). This measures your heart rhythm.  EMG (Electromyography). This is a measurement of how the muscles are working in your upper airway and your legs while sleeping.  An oximetry measurement. It measures how much oxygen (air) you are getting while sleeping.  Breathing efforts may be measured. The same test can be interpreted (understood) differently by different caregivers and centers that study sleep.  Studies may be given an apnea/hypopnea index (AHI). This is a number which is found by counting the times of no breathing or under breathing during the night, and relating those numbers to the amount of time spent in bed. When the AHI is greater than 15, the patient is likely to complain of daytime sleepiness. When the AHI is greater than 30, the patient is at increased risk for heart problems and must be followed more closely. Following the AHI also allows you to know how treatment is working. Simple oximetry (tracking the amount of oxygen that is taken in) can be  used for screening patients who:  Do not have symptoms (problems) of OSA.  Have a normal Epworth Sleepiness Scale Score.  Have a low pre-test probability of having OSA.  Have none of the upper airway problems likely to cause apnea.  Oximetry is also used to determine if treatment is effective in patients who showed significant desaturations (not getting enough oxygen) on their home sleep study. One extra  measure of safety is to perform additional studies for the person who only snores. This is because no one can predict with absolute certainty who will have OSA. Those who show significant desaturations (not getting enough oxygen) are recommended to have a more detailed sleep study. Document Released: 08/23/2002 Document Revised: 05/11/2011 Document Reviewed: 02/16/2005 [facility] Patient Information 2014 [place]. Sleep Apnea  Sleep apnea is a sleep disorder characterized by abnormal pauses in breathing while you sleep. When your breathing pauses, the level of oxygen in your blood decreases. This causes you to move out of deep sleep and into light sleep. As a result, your quality of sleep is poor, and the system that carries your blood throughout your body (cardiovascular system) experiences stress. If sleep apnea remains untreated, the following conditions can develop:  High blood pressure (hypertension).  Coronary artery disease.  Inability to achieve or maintain an erection (impotence).  Impairment of your thought process (cognitive dysfunction). There are three types of sleep apnea: 1. Obstructive sleep apnea Pauses in breathing during sleep because of a blocked airway. 2. Central sleep apnea Pauses in breathing during sleep because the area of the brain that controls your breathing does not send the correct signals to the muscles that control breathing. 3. Mixed sleep apnea A combination of both obstructive and central sleep apnea. RISK FACTORS The following risk factors can increase your risk of developing sleep apnea:  Being overweight.  Smoking.  Having narrow passages in your nose and throat.  Being of older age.  Being female.  Alcohol use.  Sedative and tranquilizer use.  Ethnicity. Among individuals younger than 35 years, African Americans are at increased risk of sleep apnea. SYMPTOMS   Difficulty staying asleep.  Daytime sleepiness and fatigue.  Loss of  energy.  Irritability.  Loud, heavy snoring.  Morning headaches.  Trouble concentrating.  Forgetfulness.  Decreased interest in sex. DIAGNOSIS  In order to diagnose sleep apnea, your caregiver will perform a physical examination. Your caregiver may suggest that you take a home sleep test. Your caregiver may also recommend that you spend the night in a sleep lab. In the sleep lab, several monitors record information about your heart, lungs, and brain while you sleep. Your leg and arm movements and blood oxygen level are also recorded. TREATMENT The following actions may help to resolve mild sleep apnea:  Sleeping on your side.   Using a decongestant if you have nasal congestion.   Avoiding the use of depressants, including alcohol, sedatives, and narcotics.   Losing weight and modifying your diet if you are overweight. There also are devices and treatments to help open your airway:  Oral appliances. These are custom-made mouthpieces that shift your lower jaw forward and slightly open your bite. This opens your airway.  Devices that create positive airway pressure. This positive pressure "splints" your airway open to help you breathe better during sleep. The following devices create positive airway pressure:  Continuous positive airway pressure (CPAP) device. The CPAP device creates a continuous level of air pressure with an air pump. The air is delivered  to your airway through a mask while you sleep. This continuous pressure keeps your airway open.  Nasal expiratory positive airway pressure (EPAP) device. The EPAP device creates positive air pressure as you exhale. The device consists of single-use valves, which are inserted into each nostril and held in place by adhesive. The valves create very little resistance when you inhale but create much more resistance when you exhale. That increased resistance creates the positive airway pressure. This positive pressure while you exhale  keeps your airway open, making it easier to breath when you inhale again.  Bilevel positive airway pressure (BPAP) device. The BPAP device is used mainly in patients with central sleep apnea. This device is similar to the CPAP device because it also uses an air pump to deliver continuous air pressure through a mask. However, with the BPAP machine, the pressure is set at two different levels. The pressure when you exhale is lower than the pressure when you inhale.  Surgery. Typically, surgery is only done if you cannot comply with less invasive treatments or if the less invasive treatments do not improve your condition. Surgery involves removing excess tissue in your airway to create a wider passage way. Document Released: 02/06/2002 Document Revised: 08/18/2011 Document Reviewed: 06/25/2011 [facility] Patient Information 2014 [place].

## 2012-12-13 ENCOUNTER — Ambulatory Visit (INDEPENDENT_AMBULATORY_CARE_PROVIDER_SITE_OTHER): Payer: Medicare Other | Admitting: Neurology

## 2012-12-13 DIAGNOSIS — G4733 Obstructive sleep apnea (adult) (pediatric): Secondary | ICD-10-CM

## 2012-12-13 DIAGNOSIS — G4710 Hypersomnia, unspecified: Secondary | ICD-10-CM

## 2012-12-13 DIAGNOSIS — E662 Morbid (severe) obesity with alveolar hypoventilation: Secondary | ICD-10-CM

## 2012-12-23 ENCOUNTER — Encounter: Payer: Self-pay | Admitting: Neurology

## 2012-12-23 ENCOUNTER — Telehealth: Payer: Self-pay | Admitting: Neurology

## 2012-12-23 NOTE — Telephone Encounter (Signed)
I called and spoke with patient about her recent sleep study results and understood and all questions were answered. Patient will be set-up with [facility] and she will call us once she receive the BIPAP machine.  I informed the patient that I will mail her a copy of the report and Dr. Chase Caller.

## 2012-12-26 ENCOUNTER — Other Ambulatory Visit: Payer: Self-pay | Admitting: *Deleted

## 2012-12-26 ENCOUNTER — Encounter: Payer: Self-pay | Admitting: *Deleted

## 2012-12-28 ENCOUNTER — Other Ambulatory Visit: Payer: Self-pay | Admitting: *Deleted

## 2012-12-28 DIAGNOSIS — G4733 Obstructive sleep apnea (adult) (pediatric): Secondary | ICD-10-CM

## 2012-12-30 ENCOUNTER — Telehealth: Payer: Self-pay | Admitting: Internal Medicine

## 2012-12-30 DIAGNOSIS — R911 Solitary pulmonary nodule: Secondary | ICD-10-CM

## 2012-12-30 NOTE — Telephone Encounter (Signed)
plese ensure CT chest for nodule and fu with me  in 9 months from Jun 2014 and also supposed to see Dr Aundra Dubin for co art calcification but I do not see she did ethat

## 2013-01-03 NOTE — Telephone Encounter (Signed)
Pt saw Dr. Einar Gip and records are in media tab. Also order has been placed for CT in 9 months from June.  Bing, CMA

## 2013-01-05 ENCOUNTER — Other Ambulatory Visit: Payer: Self-pay

## 2013-01-10 ENCOUNTER — Encounter: Payer: Self-pay | Admitting: Neurology

## 2013-01-10 ENCOUNTER — Other Ambulatory Visit: Payer: Self-pay | Admitting: Endocrinology

## 2013-01-24 ENCOUNTER — Encounter: Payer: Self-pay | Admitting: Endocrinology

## 2013-01-24 ENCOUNTER — Encounter: Payer: Medicare Other | Admitting: Endocrinology

## 2013-01-24 ENCOUNTER — Ambulatory Visit (INDEPENDENT_AMBULATORY_CARE_PROVIDER_SITE_OTHER): Payer: Medicare Other | Admitting: Endocrinology

## 2013-01-24 VITALS — BP 130/66 | HR 64 | Temp 97.8°F | Resp 12 | Ht 64.0 in | Wt 286.5 lb

## 2013-01-24 DIAGNOSIS — E042 Nontoxic multinodular goiter: Secondary | ICD-10-CM | POA: Diagnosis not present

## 2013-01-24 DIAGNOSIS — E119 Type 2 diabetes mellitus without complications: Secondary | ICD-10-CM | POA: Diagnosis not present

## 2013-01-24 DIAGNOSIS — D649 Anemia, unspecified: Secondary | ICD-10-CM | POA: Diagnosis not present

## 2013-01-24 DIAGNOSIS — Z0000 Encounter for general adult medical examination without abnormal findings: Secondary | ICD-10-CM

## 2013-01-24 DIAGNOSIS — I10 Essential (primary) hypertension: Secondary | ICD-10-CM | POA: Diagnosis not present

## 2013-01-24 DIAGNOSIS — E669 Obesity, unspecified: Secondary | ICD-10-CM | POA: Insufficient documentation

## 2013-01-24 DIAGNOSIS — Z23 Encounter for immunization: Secondary | ICD-10-CM

## 2013-01-24 DIAGNOSIS — K7689 Other specified diseases of liver: Secondary | ICD-10-CM

## 2013-01-24 DIAGNOSIS — E7800 Pure hypercholesterolemia, unspecified: Secondary | ICD-10-CM

## 2013-01-24 DIAGNOSIS — Z79899 Other long term (current) drug therapy: Secondary | ICD-10-CM | POA: Diagnosis not present

## 2013-01-24 LAB — CBC WITH DIFFERENTIAL/PLATELET
Basophils Absolute: 0 10*3/uL (ref 0.0–0.1)
Basophils Relative: 0.2 % (ref 0.0–3.0)
Eosinophils Absolute: 0.2 10*3/uL (ref 0.0–0.7)
Eosinophils Relative: 3.5 % (ref 0.0–5.0)
HCT: 36.6 % (ref 36.0–46.0)
Hemoglobin: 12.1 g/dL (ref 12.0–15.0)
Lymphocytes Relative: 23 % (ref 12.0–46.0)
Lymphs Abs: 1.2 10*3/uL (ref 0.7–4.0)
MCHC: 33 g/dL (ref 30.0–36.0)
MCV: 88.2 fl (ref 78.0–100.0)
Monocytes Absolute: 0.4 10*3/uL (ref 0.1–1.0)
Monocytes Relative: 7.7 % (ref 3.0–12.0)
Neutro Abs: 3.4 10*3/uL (ref 1.4–7.7)
Neutrophils Relative %: 65.6 % (ref 43.0–77.0)
Platelets: 244 10*3/uL (ref 150.0–400.0)
RBC: 4.15 Mil/uL (ref 3.87–5.11)
RDW: 16.9 % — ABNORMAL HIGH (ref 11.5–14.6)
WBC: 5.1 10*3/uL (ref 4.5–10.5)

## 2013-01-24 LAB — HEPATIC FUNCTION PANEL
Alkaline Phosphatase: 61 U/L (ref 39–117)
Bilirubin, Direct: 0 mg/dL (ref 0.0–0.3)

## 2013-01-24 LAB — EKG 12-LEAD

## 2013-01-24 LAB — BASIC METABOLIC PANEL
GFR: 94.95 mL/min (ref >60.00)
Glucose, Bld: 97 mg/dL (ref 70–99)
Potassium: 3.5 mEq/L (ref 3.5–5.1)
Sodium: 139 mEq/L (ref 135–145)

## 2013-01-24 LAB — HEMOGLOBIN A1C: Hgb A1c MFr Bld: 6.8 % — ABNORMAL HIGH (ref 4.6–6.5)

## 2013-01-24 LAB — LIPID PANEL
Total CHOL/HDL Ratio: 3
VLDL: 27.8 mg/dL (ref 0.0–40.0)

## 2013-01-24 LAB — TSH: TSH: 1.85 u[IU]/mL (ref 0.35–5.50)

## 2013-01-24 MED ORDER — ROSUVASTATIN CALCIUM 40 MG PO TABS: 40.0000 mg | ORAL_TABLET | Freq: Every day | ORAL | Status: DC

## 2013-01-24 MED ORDER — METFORMIN HCL ER 500 MG PO TB24: 1000.0000 mg | ORAL_TABLET | Freq: Two times a day (BID) | ORAL | Status: DC

## 2013-01-24 MED ORDER — FUROSEMIDE 20 MG PO TABS: 20.0000 mg | ORAL_TABLET | Freq: Every day | ORAL | Status: DC

## 2013-01-24 MED ORDER — LOSARTAN POTASSIUM 25 MG PO TABS: 25.0000 mg | ORAL_TABLET | Freq: Every day | ORAL | Status: DC

## 2013-01-24 MED ORDER — COLESEVELAM HCL 3.75 G PO PACK: PACK | ORAL | Status: DC

## 2013-01-24 MED ORDER — FEXOFENADINE-PSEUDOEPHED ER 180-240 MG PO TB24: 1.0000 | ORAL_TABLET | Freq: Every day | ORAL | Status: DC

## 2013-01-24 MED ORDER — CLOPIDOGREL BISULFATE 75 MG PO TABS: 75.0000 mg | ORAL_TABLET | Freq: Every day | ORAL | Status: DC

## 2013-01-24 NOTE — Progress Notes (Signed)
Subjective:    Patient ID: Heather [name], female    DOB: Aug 02, 1945, 67 y.o.   MRN: [id number]  HPI The state of at least three ongoing medical problems is addressed today, with interval history of each noted here: Pt returns for f/u of type 2 DM (dx'ed 6433--IR known complications).  pt states she feels well in general, except for fatigue.  Obesity: she continues to be discouraged about her difficulty losing weight.  She still has DOE.   Dyslipidemia: she denies chest pain. Past Medical History  Diagnosis Date  . ALLERGIC RHINITIS 10/22/2006  . ANEMIA 12/18/2008  . ASYMPTOMATIC POSTMENOPAUSAL STATUS 11/22/2007  . DIABETES MELLITUS, TYPE II 01/13/2007  . GERD 07/20/2007  . GOITER, MULTINODULAR 07/20/2007  . Headache(784.0) 07/20/2007  . HEARING LOSS 11/22/2007  . HYPERCHOLESTEROLEMIA 01/13/2007  . HYPERTENSION 10/22/2006  . OSTEOARTHRITIS 10/22/2006  . Other chronic nonalcoholic liver disease 07/18/8414  . Hyperglycemia   . NASH (nonalcoholic steatohepatitis)   . Fibroid   . Hx of colposcopy with cervical biopsy   . Abnormal Pap smear   . Complication of anesthesia   . Dyslipidemia   . Obesity   . Obstructive sleep apnea   . Degenerative arthritis     Past Surgical History  Procedure Laterality Date  . Esophagogastroduodenoscopy  12/08/2005  . Stress cardiolite  10/21/2005  . Electrocardiogram  10/15/2006  . Dexa  08/2005  . Wisdom tooth extraction    . Breast surgery      Breast reduction  . Sweat gland removal    . Dilation and curettage of uterus    . Eye surgery      History   Social History  . Marital Status: Married    Spouse Name: [name]    Number of Children: 0  . Years of Education: College   Occupational History  . Retired    Social History Main Topics  . Smoking status: Former Smoker -- 2.00 packs/day for 20 years    Types: Cigarettes    Quit date: 03/03/1979  . Smokeless tobacco: Never Used  . Alcohol Use: Yes     Comment: occasional   . Drug  Use: No  . Sexual Activity: Yes    Birth Control/ Protection: Surgical   Other Topics Concern  . Not on file   Social History Narrative   Patient lives at home with family.   Caffeine Use: 2 soda every other day    Current Outpatient Prescriptions on File Prior to Visit  Medication Sig Dispense Refill  . acetaminophen (TYLENOL) 500 MG tablet Take 500 mg by mouth 3 (three) times daily as needed for pain.      . Calcium Carbonate-Vitamin D (CALCIUM 600 + D PO) Take 2 tablets by mouth daily.        . Ferrous Sulfate (IRON) 325 (65 FE) MG TABS Take 1 tablet by mouth daily.        [name] glucosamine-chondroitin 500-400 MG tablet Take 1 tablet by mouth 2 (two) times daily.        . Multiple Vitamin (MULTIVITAMIN) tablet Take 1 tablet by mouth daily.        . naproxen sodium (ANAPROX) 220 MG tablet Take 220 mg by mouth 2 (two) times daily between meals as needed.      [name] omeprazole (PRILOSEC) 20 MG capsule Take 20 mg by mouth daily as needed.       . potassium chloride SA (K-DUR,KLOR-CON) 20 MEQ tablet Take 1 tablet (20 mEq total)  by mouth daily.  90 tablet  3  . propranolol (INDERAL) 20 MG tablet Take 20 mg by mouth 2 (two) times daily. For migraines       . SUMAtriptan (IMITREX) 20 MG/ACT nasal spray Place 1 spray (20 mg total) into the nose every 2 (two) hours as needed for migraine.  2 Inhaler  5  . topiramate (TOPAMAX) 100 MG tablet Take 100 mg by mouth 2 (two) times daily.       . vitamin E 200 UNIT capsule Take 200 Units by mouth daily.       No current facility-administered medications on file prior to visit.    Allergies  Allergen Reactions  . Aspirin   . Lisinopril     REACTION: cough  . Penicillins     REACTION: rash  . Prednisone     Family History  Problem Relation Age of Onset  . Cancer Mother     Breast Cancer, Colon Cancer  . Asthma Mother   . Depression Mother   . Bipolar disorder Mother   . Dementia Mother   . Arthritis Mother   . Hypertension Father   .  Migraines Father   . Cancer Maternal Aunt   . Heart disease Maternal Grandmother   . Cancer Maternal Grandfather     BP 130/66  Pulse 64  Temp(Src) 97.8 F (36.6 C) (Oral)  Resp 12  Ht 5' 4"  (1.626 m)  Wt 286 lb 8 oz (129.956 kg)  BMI 49.15 kg/m2  Review of Systems No change in chronic arthralgias.  She has mild edema of the legs.    Objective:   Physical Exam VITAL SIGNS:  See vs page GENERAL: no distress   i reviewed electrocardiogram.   Lab Results  Component Value Date   WBC 5.1 01/24/2013   HGB 12.1 01/24/2013   HCT 36.6 01/24/2013   PLT 244.0 01/24/2013   GLUCOSE 97 01/24/2013   CHOL 149 01/24/2013   TRIG 139.0 01/24/2013   HDL 55.00 01/24/2013   LDLCALC 66 01/24/2013   ALT 11 01/24/2013   AST 15 01/24/2013   NA 139 01/24/2013   K 3.5 01/24/2013   CL 105 01/24/2013   CREATININE 0.8 01/24/2013   BUN 12 01/24/2013   CO2 27 01/24/2013   TSH 1.85 01/24/2013   HGBA1C 6.8* 01/24/2013   MICROALBUR 1.6 01/25/2013      Assessment & Plan:  DM: well-controlled Morbid obesity: this contributes to doe Dyslipidemia: well-controlled.    Subjective:   Patient here for Medicare annual wellness visit and management of other chronic and acute problems.     Risk factors: advanced age    31 of Physicians Providing Medical Care to Patient:  See "snapshot"   Activities of Daily Living: In your present state of health, do you have any difficulty performing the following activities?:  Preparing food and eating?: No  Bathing yourself: No  Getting dressed: No  Using the toilet:No  Moving around from place to place: No  In the past year have you fallen or had a near fall?: No    Home Safety: Has smoke detector and wears seat belts. No firearms. No excess sun exposure.  Diet and Exercise  Current exercise habits: pt says not good. Dietary issues discussed: pt reports diet is poor  Depression Screen  Q1: Over the past two weeks, have you felt down, depressed  or hopeless?no  Q2: Over the past two weeks, have you felt little interest or pleasure in doing  things? no   The following portions of the patient's history were reviewed and updated as appropriate: allergies, current medications, past family history, past medical history, past social history, past surgical history and problem list.  Past Medical History  Diagnosis Date  . ALLERGIC RHINITIS 10/22/2006  . ANEMIA 12/18/2008  . ASYMPTOMATIC POSTMENOPAUSAL STATUS 11/22/2007  . DIABETES MELLITUS, TYPE II 01/13/2007  . GERD 07/20/2007  . GOITER, MULTINODULAR 07/20/2007  . Headache(784.0) 07/20/2007  . HEARING LOSS 11/22/2007  . HYPERCHOLESTEROLEMIA 01/13/2007  . HYPERTENSION 10/22/2006  . OSTEOARTHRITIS 10/22/2006  . Other chronic nonalcoholic liver disease 3/82/5053  . Hyperglycemia   . NASH (nonalcoholic steatohepatitis)   . Fibroid   . Hx of colposcopy with cervical biopsy   . Abnormal Pap smear   . Complication of anesthesia   . Dyslipidemia   . Obesity   . Obstructive sleep apnea   . Degenerative arthritis     Past Surgical History  Procedure Laterality Date  . Esophagogastroduodenoscopy  12/08/2005  . Stress cardiolite  10/21/2005  . Electrocardiogram  10/15/2006  . Dexa  08/2005  . Wisdom tooth extraction    . Breast surgery      Breast reduction  . Sweat gland removal    . Dilation and curettage of uterus    . Eye surgery      History   Social History  . Marital Status: Married    Spouse Name: [name]    Number of Children: 0  . Years of Education: College   Occupational History  . Retired    Social History Main Topics  . Smoking status: Former Smoker -- 2.00 packs/day for 20 years    Types: Cigarettes    Quit date: 03/03/1979  . Smokeless tobacco: Never Used  . Alcohol Use: Yes     Comment: occasional   . Drug Use: No  . Sexual Activity: Yes    Birth Control/ Protection: Surgical   Other Topics Concern  . Not on file   Social History Narrative   Patient  lives at home with family.   Caffeine Use: 2 soda every other day    Current Outpatient Prescriptions on File Prior to Visit  Medication Sig Dispense Refill  . acetaminophen (TYLENOL) 500 MG tablet Take 500 mg by mouth 3 (three) times daily as needed for pain.      . Calcium Carbonate-Vitamin D (CALCIUM 600 + D PO) Take 2 tablets by mouth daily.        . Ferrous Sulfate (IRON) 325 (65 FE) MG TABS Take 1 tablet by mouth daily.        [name] glucosamine-chondroitin 500-400 MG tablet Take 1 tablet by mouth 2 (two) times daily.        . Multiple Vitamin (MULTIVITAMIN) tablet Take 1 tablet by mouth daily.        . naproxen sodium (ANAPROX) 220 MG tablet Take 220 mg by mouth 2 (two) times daily between meals as needed.      [name] omeprazole (PRILOSEC) 20 MG capsule Take 20 mg by mouth daily as needed.       . potassium chloride SA (K-DUR,KLOR-CON) 20 MEQ tablet Take 1 tablet (20 mEq total) by mouth daily.  90 tablet  3  . propranolol (INDERAL) 20 MG tablet Take 20 mg by mouth 2 (two) times daily. For migraines       . SUMAtriptan (IMITREX) 20 MG/ACT nasal spray Place 1 spray (20 mg total) into the nose every 2 (two) hours as  needed for migraine.  2 Inhaler  5  . topiramate (TOPAMAX) 100 MG tablet Take 100 mg by mouth 2 (two) times daily.       . vitamin E 200 UNIT capsule Take 200 Units by mouth daily.       No current facility-administered medications on file prior to visit.    Allergies  Allergen Reactions  . Aspirin   . Lisinopril     REACTION: cough  . Penicillins     REACTION: rash  . Prednisone     Family History  Problem Relation Age of Onset  . Cancer Mother     Breast Cancer, Colon Cancer  . Asthma Mother   . Depression Mother   . Bipolar disorder Mother   . Dementia Mother   . Arthritis Mother   . Hypertension Father   . Migraines Father   . Cancer Maternal Aunt   . Heart disease Maternal Grandmother   . Cancer Maternal Grandfather    BP 130/66  Pulse 64  Temp(Src) 97.8 F  (36.6 C) (Oral)  Resp 12  Ht 5' 4"  (1.626 m)  Wt 286 lb 8 oz (129.956 kg)  BMI 49.15 kg/m2  Review of Systems  Denies hearing loss, and visual loss. Objective:   Vision:  Sees opthalmologist Hearing: grossly normal Body mass index:  See vs page Msk: pt easily but slowly performs "get-up-and-go" from a sitting position Cognitive Impairment Assessment: cognition, memory and judgment appear normal.  remembers 3/3 at 5 minutes.  excellent recall.  can easily read and write a sentence.  alert and oriented x 3.    Assessment:   Medicare wellness utd on preventive parameters.    Plan:   During the course of the visit the patient was educated and counseled about appropriate screening and preventive services including:        Fall prevention   Screening mammography  Bone densitometry screening  Diabetes screening  Nutrition counseling   Vaccines / LABS Zostavax / Pneumococcal Vaccine  today   Patient Instructions (the written plan) was given to the patient.   we discussed code status.  pt requests full code, but would not want to be started or maintained on artificial life-support measures if there was not a reasonable chance of recovery.

## 2013-01-24 NOTE — Patient Instructions (Addendum)
please consider these measures for your health:  minimize alcohol.  do not use tobacco products.  have a colonoscopy at least every 10 years from age 66.  Women should have an annual mammogram from age 7.  keep firearms safely stored.  always use seat belts.  have working smoke alarms in your home.  see an eye doctor and dentist regularly.  never drive under the influence of alcohol or drugs (including prescription drugs).   blood tests are being requested for you today.  We'll contact you with results. it is critically important to prevent falling down (keep floor areas well-lit, dry, and free of loose objects.  If you have a cane, walker, or wheelchair, you should use it, even for short trips around the house.  Also, try not to rush). good diet and exercise habits significanly improve the control of your diabetes.  please let me know if you wish to be referred to a dietician.  high blood sugar is very risky to your health.  you should see an eye doctor every year.  You are at higher than average risk for pneumonia and hepatitis-B.  You should be vaccinated against both.   Please come back for a follow-up appointment in 6 months.   Call when you get low on welchol, so we can change to a similar generic.

## 2013-01-25 ENCOUNTER — Other Ambulatory Visit: Payer: Self-pay | Admitting: *Deleted

## 2013-01-25 DIAGNOSIS — E119 Type 2 diabetes mellitus without complications: Secondary | ICD-10-CM

## 2013-01-25 LAB — MICROALBUMIN / CREATININE URINE RATIO: Microalb Creat Ratio: 0.7 mg/g (ref 0.0–30.0)

## 2013-01-25 LAB — URINALYSIS
Bilirubin Urine: NEGATIVE
Hgb urine dipstick: NEGATIVE
Ketones, ur: NEGATIVE
Leukocytes, UA: NEGATIVE
Urine Glucose: NEGATIVE
pH: 6.5 (ref 5.0–8.0)

## 2013-01-31 ENCOUNTER — Encounter: Payer: Self-pay | Admitting: Neurology

## 2013-02-07 ENCOUNTER — Encounter: Payer: Self-pay | Admitting: Neurology

## 2013-02-17 ENCOUNTER — Ambulatory Visit (INDEPENDENT_AMBULATORY_CARE_PROVIDER_SITE_OTHER): Payer: Medicare Other | Admitting: Neurology

## 2013-02-17 ENCOUNTER — Encounter: Payer: Self-pay | Admitting: Neurology

## 2013-02-17 VITALS — BP 99/49 | HR 66 | Resp 17 | Ht 64.0 in | Wt 289.0 lb

## 2013-02-17 DIAGNOSIS — G4734 Idiopathic sleep related nonobstructive alveolar hypoventilation: Secondary | ICD-10-CM

## 2013-02-17 DIAGNOSIS — G4733 Obstructive sleep apnea (adult) (pediatric): Secondary | ICD-10-CM

## 2013-02-17 DIAGNOSIS — E662 Morbid (severe) obesity with alveolar hypoventilation: Secondary | ICD-10-CM

## 2013-02-17 DIAGNOSIS — G4736 Sleep related hypoventilation in conditions classified elsewhere: Secondary | ICD-10-CM | POA: Diagnosis not present

## 2013-02-17 DIAGNOSIS — I2510 Atherosclerotic heart disease of native coronary artery without angina pectoris: Secondary | ICD-10-CM | POA: Diagnosis not present

## 2013-02-17 DIAGNOSIS — E119 Type 2 diabetes mellitus without complications: Secondary | ICD-10-CM | POA: Diagnosis not present

## 2013-02-17 DIAGNOSIS — R0902 Hypoxemia: Secondary | ICD-10-CM

## 2013-02-17 DIAGNOSIS — Z7902 Long term (current) use of antithrombotics/antiplatelets: Secondary | ICD-10-CM | POA: Diagnosis not present

## 2013-02-17 DIAGNOSIS — Z006 Encounter for examination for normal comparison and control in clinical research program: Secondary | ICD-10-CM | POA: Diagnosis not present

## 2013-02-17 HISTORY — DX: Idiopathic sleep related nonobstructive alveolar hypoventilation: G47.34

## 2013-02-17 NOTE — Progress Notes (Signed)
[facility]  Provider:  Larey Seat, M D  Referring Provider: [name], MD Primary Care Physician:  [name], MD  Chief Complaint  Patient presents with  . CPAP COMPLIANCE    DATA WAS DOWNLOADED IN [facility]IC TODAY [place] VISIT    HPI:  Heather [name] is a 67 y.o. female  Is seen here as a referral  from Dr. Loanne Drilling for sleep medicine consultation .  Heather [name], a  67 year old right-handed [place] American female , is a former patient of Dr. Elvia Collum and is followed now by Dr. Floyde Parkins here at [facility].  She was referred by Dr. Doy Mince in June 2011 for a sleep study based on chief complaints of snoring, witnessed apneas and excessive daytime sleepiness. At the time the patient endorsed  the Epworth sleepiness score at a very high count of 20/24 points and the Beck's inventory at 3 points, her  BMI was 49.9, and her neck conference measured 14.5 inches .   She was diagnosed with OSA after her study revealed an AHI of 11.7 (which constitutes mild apnea). During REM sleep, her AHI increased to 68.2/hr.  The oxygen  desaturations associated with REM sleep in supine sleep position  were suspected to be a cause for the patient's excessive daytime sleepiness and the reported morning headaches. The patient had reported at that time that she would prefer to sleep up sitting in a chair or recliner. She never was titrated to CPAP or followed up on CPAP titration.  The patient's pulmonologist, Dr. Chase Caller, recommended a review of her sleep breathing disorder in light of a new  diagnosis of nocturnal hypoxemia. The patient desaturates according to her her husband and from recollection of ONO data  into the 60s and 70s at night ( as documented on a home based  pulse oximetry). Cardiologist evaluation  found hypoxemia . The patient started per cardiologist on Losartin and ASA.   Interval history :  I saw Heather [name] last she has been  compliantly using her BiPAP machine.  Underwent a split night polysomnography on 12-13-12 and had endorsed the Epworth sleepiness score at 21 points her neck circumference was 14.5 inches BMI 48.4. Cell the patient had an AHI of 46.8 which constitutes severe apnea roadie I was 48.7. The orbital retention was not found by a CMS criteria but she was at a level of 49.4 or. Respiratory events were hypotony as no frank apneas. Oxygen lowest saturation was 75% but 110 minutes of desaturation during the baseline part of this but study.  The patient was titrated to a BiPAP setting of 11/7 cm water under which she slept 120 minutes and reached finally REM sleep.  The patient tolerated BiPAP much better than CPAP , an ESON  medium size nasal mask was used .  She had prolonged hypoxemia persisted  in spite of CPAP and BiPAP use at various times during the night -02  nadir of 78% during titration.    I am able to compared downloads from [facility] beginning November 2014 to our in office data. The patient is using BiPAP  machine and has an inspiratory pressure of 11 cm and expiratory pressure of 7 cm water.  she still has a high air leak,  however her AHI is now 1.3. She uses a machine on average in therapy 5 hours and 20 minutes daily,  she has an 81% compliance for days  with more than 4 hours of  CPAP use. Her husband reports that he has noted her sleep to be more restful she is not tossing and running as much.  The patient endorsed today the GDS score at one point still her sleepiness score I Epworth questionnaire is 22 points out of a possible 24, and her fatigue scores were still very high at 49 points. She has less nocturia and less dry mouth in the morning.    Last visit note  Her weight has been stable over the last 3 years , since her last  PSG.  Her sleep habits are as follows: She initiates sleep around 1-1.30 AM, promptly falls asleep. Wakes up 3-5 times with nocturia and continues to   sleep reclined.   She will finally rise at  6-7 AM , often with a dull, global  Headache, but  she feels restored.  Has a dry mouth ,assumed to be caused by snoring.  Around 10 AM she feels sleepy again and often naps. She falls regularly asleep at her computer or  whenever physicially inactive and not stimulated. She sleep talks but does not leave the bed , no walking eating , not thrashing and no PLMs reported .  Dry mouth is extremely bothersome,  Dry eyes, she tends to move slow as not to get dizzy. She has felt impaired for many reasons that are not clearly related to sleep.   She underwent a breast reduction but no airway , facial , nasal surgery. There is no history of brain trauma or neck- airway trauma.     ROS : She has been seen by Dr. Jannifer Franklin in July 2014 ,6 month earlier.  His notes in Epic did not mention her EDS or apnea history and the patient did not endorse related symptoms to him.  Review of Systems: Out of a complete 14 system review, the patient complains of only the following symptoms, and all other reviewed systems are negative. HYPOXEMIA.  The patient has SOB, morbidly obese, has CHF and ankle edema.  Epworth is 20,  FSS 54, GDS at 2 points  ,  Fall risk at  8 points   History   Social History  . Marital Status: Married    Spouse Name: [name]    Number of Children: 0  . Years of Education: College   Occupational History  . Retired    Social History Main Topics  . Smoking status: Former Smoker -- 2.00 packs/day for 20 years    Types: Cigarettes    Quit date: 03/03/1979  . Smokeless tobacco: Never Used  . Alcohol Use: Yes     Comment: occasional   . Drug Use: No  . Sexual Activity: Yes    Birth Control/ Protection: Surgical   Other Topics Concern  . Not on file   Social History Narrative   Patient lives at home with family.   Caffeine Use: 2 soda every other day    Family History  Problem Relation Age of Onset  . Cancer Mother     Breast Cancer,  Colon Cancer  . Asthma Mother   . Depression Mother   . Bipolar disorder Mother   . Dementia Mother   . Arthritis Mother   . Hypertension Father   . Migraines Father   . Cancer Maternal Aunt   . Heart disease Maternal Grandmother   . Cancer Maternal Grandfather     Past Medical History  Diagnosis Date  . ALLERGIC RHINITIS 10/22/2006  . ANEMIA 12/18/2008  . ASYMPTOMATIC POSTMENOPAUSAL  STATUS 11/22/2007  . DIABETES MELLITUS, TYPE II 01/13/2007  . GERD 07/20/2007  . GOITER, MULTINODULAR 07/20/2007  . Headache(784.0) 07/20/2007  . HEARING LOSS 11/22/2007  . HYPERCHOLESTEROLEMIA 01/13/2007  . HYPERTENSION 10/22/2006  . OSTEOARTHRITIS 10/22/2006  . Other chronic nonalcoholic liver disease 2/42/3536  . Hyperglycemia   . NASH (nonalcoholic steatohepatitis)   . Fibroid   . Hx of colposcopy with cervical biopsy   . Abnormal Pap smear   . Complication of anesthesia   . Dyslipidemia   . Obesity   . Obstructive sleep apnea   . Degenerative arthritis     Past Surgical History  Procedure Laterality Date  . Esophagogastroduodenoscopy  12/08/2005  . Stress cardiolite  10/21/2005  . Electrocardiogram  10/15/2006  . Dexa  08/2005  . Wisdom tooth extraction    . Breast surgery      Breast reduction  . Sweat gland removal    . Dilation and curettage of uterus    . Eye surgery      Current Outpatient Prescriptions  Medication Sig Dispense Refill  . Calcium Carbonate-Vitamin D (CALCIUM 600 + D PO) Take 2 tablets by mouth daily.        . clopidogrel (PLAVIX) 75 MG tablet Take 1 tablet (75 mg total) by mouth daily.  90 tablet  3  . Colesevelam HCl (WELCHOL) 3.75 G PACK Use 1 packet daily.  90 each  3  . Ferrous Sulfate (IRON) 325 (65 FE) MG TABS Take 1 tablet by mouth daily.        . furosemide (LASIX) 20 MG tablet Take 1 tablet (20 mg total) by mouth daily.  90 tablet  3  . glucosamine-chondroitin 500-400 MG tablet Take 1 tablet by mouth 2 (two) times daily.        [name] losartan (COZAAR) 25  MG tablet Take 1 tablet (25 mg total) by mouth daily.  90 tablet  3  . metFORMIN (GLUCOPHAGE-XR) 500 MG 24 hr tablet Take 2 tablets (1,000 mg total) by mouth 2 (two) times daily.  360 tablet  3  . Multiple Vitamin (MULTIVITAMIN) tablet Take 1 tablet by mouth daily.        . naproxen sodium (ANAPROX) 220 MG tablet Take 220 mg by mouth 2 (two) times daily between meals as needed.      [name] omeprazole (PRILOSEC) 20 MG capsule Take 20 mg by mouth daily as needed.       . potassium chloride SA (K-DUR,KLOR-CON) 20 MEQ tablet Take 1 tablet (20 mEq total) by mouth daily.  90 tablet  3  . propranolol (INDERAL) 20 MG tablet Take 20 mg by mouth 2 (two) times daily. For migraines       . rosuvastatin (CRESTOR) 40 MG tablet Take 1 tablet (40 mg total) by mouth daily.  30 tablet  11  . topiramate (TOPAMAX) 100 MG tablet Take 100 mg by mouth 2 (two) times daily.       . vitamin E 200 UNIT capsule Take 200 Units by mouth daily.      [name] acetaminophen (TYLENOL) 500 MG tablet Take 500 mg by mouth 3 (three) times daily as needed for pain.      . fexofenadine-pseudoephedrine (ALLEGRA-D ALLERGY & CONGESTION) 180-240 MG per 24 hr tablet Take 1 tablet by mouth daily.  90 tablet  3  . SUMAtriptan (IMITREX) 20 MG/ACT nasal spray Place 1 spray (20 mg total) into the nose every 2 (two) hours as needed for migraine.  2 Inhaler  5   No current facility-administered medications for this visit.    Allergies as of 02/17/2013 - Review Complete 02/17/2013  Allergen Reaction Noted  . Aspirin  08/04/2011  . Lisinopril  06/03/2006  . Penicillins  06/03/2006  . Prednisone  07/12/2012    Vitals: BP 99/49  Pulse 66  Resp 17  Ht 5' 4"  (1.626 m)  Wt 289 lb (131.09 kg)  BMI 49.58 kg/m2 Last Weight:  Wt Readings from Last 1 Encounters:  02/17/13 289 lb (131.09 kg)   Last Height:   Ht Readings from Last 1 Encounters:  02/17/13 5' 4"  (1.626 m)    Physical exam:  General: The patient is awake, alert and appears not in acute  distress. The patient is well groomed. Head: Normocephalic, atraumatic. Neck is supple. Mallampati 3 , neck circumference: 15.5 , no nasal deviation but slightly nasal voice.  Cardiovascular:  Regular rate and rhythm , without  murmurs or carotid bruit, and without distended neck veins. Respiratory: Lungs are clear to auscultation. Skin:  With evidence of  Ankle edema 2 plus , no rash. Trunk: BMI is significantly  elevated . The  patient  has normal posture.  Neurologic exam : The patient is awake and alert, oriented to place and time.  Memory subjective  described as intact. There is a normal attention span & concentration ability. Speech is fluent without  dysarthria, dysphonia or aphasia. Mood and affect are appropriate.  Cranial nerves: No loss of taste and smell, Pupils are equal and briskly reactive to light. Funduscopic exam without  evidence of pallor or edema. She is status post cataract ,  Extraocular movements  in vertical and horizontal planes intact and without nystagmus. Visual fields by finger perimetry are intact. Hearing to finger rub intact.  Facial sensation intact to fine touch. Facial motor strength is symmetric and tongue and uvula move midline.  Motor exam:   Reduced muscle tone, no rigor, and no cog-wheeling. The patient is morbidly obese - the muscle bulk is difficult to assess-  Evidence of symmetrically  reduced proximal muscle strength in all extremities. (Thigh- upper arm)   Coordination: Rapid alternating movements in the fingers/hands is tested and normal.  Finger-to-nose maneuver tested and normal without evidence of ataxia, dysmetria or tremor.  Gait and station: Patient walks without assistive device - her  Stance is wide based ,  Tandem gait is fragmented. She has trouble turning, has  trouble managing stairs , one foot at a time.   The patient walks deliberately slow and measured.  Romberg testing is positive , she sways to the right and forward, this  propulsive trend is  not present when her eyes are open .   Reported no fallls in 18 month.   Deep tendon reflexes: in the  upper and lower extremities are symmetrically attenuated.  Babinski maneuver response is  downgoing.   Assessment:  After physical and neurologic examination, review of sleep studies, and pre-existing records, assessment is that of a patient with morbid obesity , morning headaches and known OSA - 3 years ago at a mild degree.  Since than she gained weight and lost muscle tone. Her headaches have been treated by Dr Jannifer Franklin.  She has documented  Edema, SOB and hypoxia at night.   Plan:  Treatment plan and additional workup :  1) SPLIT study  Results discussed -  High AHI treated with BiPAP and well tolerated.  ,  Low  O2 and CO2 ,  Was borderline for  CO2 retention and was severely and prolonged hypoxic.  She needs to take her lasix in early PM and not at night, needs to have oxygen supplemented.  2) BMI reduction  to be addressed with her PCP and endocrinologist.  3) exercise regimen to be established- the patient seems to be deconditioned.  5) Dizziness  and sleepiness may be provoked by a beta blocker effect-  Could this patient use an XR form at night only ?   Cc Chalmers Guest.

## 2013-02-17 NOTE — Patient Instructions (Signed)
Hypersomnia Hypersomnia usually brings recurrent episodes of excessive daytime sleepiness or prolonged nighttime sleep. It is different than feeling tired due to lack of or interrupted sleep at night. People with hypersomnia are compelled to nap repeatedly during the day. This is often at inappropriate times such as:  At work.  During a meal.  In conversation. These daytime naps usually provide no relief. This disorder typically affects adolescents and young adults. CAUSES  This condition may be caused by:  Another sleep disorder (such as narcolepsy or sleep apnea).  Dysfunction of the autonomic nervous system.  Drug or alcohol abuse.  A physical problem, such as:  A tumor.  Head trauma. This is damage caused by an accident.  Injury to the central nervous system.  Certain medications, or medicine withdrawal.  Medical conditions may contribute to the disorder, including:  Multiple sclerosis.  Depression.  Encephalitis.  Epilepsy.  Obesity.  Some people appear to have a genetic predisposition to this disorder. In others, there is no known cause. SYMPTOMS   Patients often have difficulty waking from a long sleep. They may feel dazed or confused.  Other symptoms may include:  Anxiety.  Increased irritation (inflammation).  Decreased energy.  Restlessness.  Slow thinking.  Slow speech.  Loss of appetite.  Hallucinations.  Memory difficulty.  Tremors, Tics.  Some patients lose the ability to function in family, social, occupational, or other settings. TREATMENT  Treatment is symptomatic in nature. Stimulants and other drugs may be used to treat this disorder. Changes in behavior may help. For example, avoid night work and social activities that delay bed time. Changes in diet may offer some relief. Patients should avoid alcohol and caffeine. PROGNOSIS  The likely outcome (prognosis) for persons with hypersomnia depends on the cause of the disorder.  The disorder itself is not life threatening. But it can have serious consequences. For example, automobile accidents can be caused by falling asleep while driving. The attacks usually continue indefinitely. Document Released: 02/06/2002 Document Revised: 05/11/2011 Document Reviewed: 01/11/2008 [facility] Patient Information 2014 [place], [place]. Sleep Apnea Sleep apnea is disorder that affects a person's sleep. A person with sleep apnea has abnormal pauses in their breathing when they sleep. It is hard for them to get a good sleep. This makes a person tired during the day. It also can lead to other physical problems. There are three types of sleep apnea. One type is when breathing stops for a short time because your airway is blocked (obstructive sleep apnea). Another type is when the brain sometimes fails to give the normal signal to breathe to the muscles that control your breathing (central sleep apnea). The third type is a combination of the other two types. HOME CARE  Do not sleep on your back. Try to sleep on your side.  Take all medicine as told by your doctor.  Avoid alcohol, calming medicines (sedatives), and depressant drugs.  Try to lose weight if you are overweight. Talk to your doctor about a healthy weight goal. Your doctor may have you use a device that helps to open your airway. It can help you get the air that you need. It is called a positive airway pressure (PAP) device. There are three types of PAP devices:  Continuous positive airway pressure (CPAP) device.  Nasal expiratory positive airway pressure (EPAP) device.  Bilevel positive airway pressure (BPAP) device. MAKE SURE YOU:  Understand these instructions.  Will watch your condition.  Will get help right away if you are not doing  well or get worse. Document Released: 11/26/2007 Document Revised: 02/03/2012 Document Reviewed: 06/20/2011 [facility] Patient Information 2014 [place].

## 2013-02-27 ENCOUNTER — Encounter: Payer: Self-pay | Admitting: Neurology

## 2013-02-27 ENCOUNTER — Other Ambulatory Visit: Payer: Self-pay | Admitting: Neurology

## 2013-02-27 DIAGNOSIS — R5381 Other malaise: Secondary | ICD-10-CM

## 2013-02-27 DIAGNOSIS — R0902 Hypoxemia: Secondary | ICD-10-CM

## 2013-02-28 ENCOUNTER — Other Ambulatory Visit: Payer: Self-pay | Admitting: *Deleted

## 2013-02-28 DIAGNOSIS — G4733 Obstructive sleep apnea (adult) (pediatric): Secondary | ICD-10-CM

## 2013-02-28 NOTE — Progress Notes (Signed)
ERROR

## 2013-03-07 ENCOUNTER — Other Ambulatory Visit: Payer: Self-pay | Admitting: Neurology

## 2013-03-07 DIAGNOSIS — R0902 Hypoxemia: Secondary | ICD-10-CM

## 2013-03-07 DIAGNOSIS — R5381 Other malaise: Secondary | ICD-10-CM

## 2013-03-07 DIAGNOSIS — R5383 Other fatigue: Principal | ICD-10-CM

## 2013-03-07 DIAGNOSIS — G4733 Obstructive sleep apnea (adult) (pediatric): Secondary | ICD-10-CM | POA: Diagnosis not present

## 2013-03-09 ENCOUNTER — Encounter: Payer: Self-pay | Admitting: Neurology

## 2013-03-13 ENCOUNTER — Ambulatory Visit: Payer: Medicare Other | Admitting: Nurse Practitioner

## 2013-03-13 ENCOUNTER — Telehealth: Payer: Self-pay | Admitting: Neurology

## 2013-03-13 DIAGNOSIS — R0602 Shortness of breath: Secondary | ICD-10-CM

## 2013-03-13 DIAGNOSIS — R911 Solitary pulmonary nodule: Secondary | ICD-10-CM

## 2013-03-13 DIAGNOSIS — J441 Chronic obstructive pulmonary disease with (acute) exacerbation: Secondary | ICD-10-CM

## 2013-03-13 DIAGNOSIS — R0902 Hypoxemia: Secondary | ICD-10-CM

## 2013-03-13 DIAGNOSIS — Z9981 Dependence on supplemental oxygen: Principal | ICD-10-CM

## 2013-03-13 DIAGNOSIS — E662 Morbid (severe) obesity with alveolar hypoventilation: Secondary | ICD-10-CM

## 2013-03-13 NOTE — Telephone Encounter (Signed)
Reviewed overnight pulse-oximetry for this patient from 03-07-13 , on BiPAP-  Patient still experienced a nadir of 68% p02, 2 hours and 21 minutes under or at 88% ,                                                                  and 3 hours and 20 minutes at or below a p02 of 89%.   Valid test time was 8 hours and 9 minutes, this patient qualifies for use of additional oxygen at 2 litres to be bled into BiPAP. Orders placed  Today. Aerica Rincon, MD

## 2013-03-16 ENCOUNTER — Other Ambulatory Visit: Payer: Self-pay

## 2013-03-16 MED ORDER — METFORMIN HCL ER 500 MG PO TB24: 1000.0000 mg | ORAL_TABLET | Freq: Two times a day (BID) | ORAL | Status: DC

## 2013-03-16 MED ORDER — LOSARTAN POTASSIUM 25 MG PO TABS: 25.0000 mg | ORAL_TABLET | Freq: Every day | ORAL | Status: DC

## 2013-03-16 MED ORDER — FUROSEMIDE 20 MG PO TABS: 20.0000 mg | ORAL_TABLET | Freq: Every day | ORAL | Status: DC

## 2013-03-16 MED ORDER — ROSUVASTATIN CALCIUM 40 MG PO TABS: 40.0000 mg | ORAL_TABLET | Freq: Every day | ORAL | Status: DC

## 2013-03-16 MED ORDER — CLOPIDOGREL BISULFATE 75 MG PO TABS: 75.0000 mg | ORAL_TABLET | Freq: Every day | ORAL | Status: DC

## 2013-03-16 NOTE — Telephone Encounter (Signed)
Oxygen should be used nocturnally and should be bled into BPAP.

## 2013-04-03 ENCOUNTER — Ambulatory Visit (INDEPENDENT_AMBULATORY_CARE_PROVIDER_SITE_OTHER): Payer: Medicare Other | Admitting: Nurse Practitioner

## 2013-04-03 ENCOUNTER — Encounter: Payer: Self-pay | Admitting: Nurse Practitioner

## 2013-04-03 ENCOUNTER — Telehealth: Payer: Self-pay | Admitting: Neurology

## 2013-04-03 VITALS — BP 103/61 | HR 79 | Ht 65.25 in | Wt 285.0 lb

## 2013-04-03 DIAGNOSIS — G4710 Hypersomnia, unspecified: Secondary | ICD-10-CM | POA: Diagnosis not present

## 2013-04-03 DIAGNOSIS — IMO0001 Reserved for inherently not codable concepts without codable children: Secondary | ICD-10-CM

## 2013-04-03 DIAGNOSIS — G4730 Sleep apnea, unspecified: Secondary | ICD-10-CM | POA: Diagnosis not present

## 2013-04-03 DIAGNOSIS — G43009 Migraine without aura, not intractable, without status migrainosus: Secondary | ICD-10-CM | POA: Diagnosis not present

## 2013-04-03 DIAGNOSIS — M791 Myalgia, unspecified site: Secondary | ICD-10-CM

## 2013-04-03 LAB — CK: CK TOTAL: 131 U/L (ref 24–173)

## 2013-04-03 MED ORDER — TOPIRAMATE 100 MG PO TABS: 100.0000 mg | ORAL_TABLET | Freq: Two times a day (BID) | ORAL | Status: DC

## 2013-04-03 MED ORDER — PROPRANOLOL HCL 20 MG PO TABS: 20.0000 mg | ORAL_TABLET | Freq: Two times a day (BID) | ORAL | Status: DC

## 2013-04-03 MED ORDER — SUMATRIPTAN 20 MG/ACT NA SOLN: 1.0000 | NASAL | Status: DC | PRN

## 2013-04-03 NOTE — Progress Notes (Signed)
[facility]  PATIENT: Heather [name] DOB: 1946-01-09   REASON FOR VISIT: Follow up for migraine   HISTORY OF PRESENT ILLNESS:  Heather [name], 68 year old female returns for follow up. She was seen in this office for migraine by Dr.Willis  10/10/2012 and for  sleep consult by Dr. Brett Fairy 02/17/2013. Her migraines are in fairly good control with Topamax 100 mg twice daily however she claims she is taking 300 mg. She is also on propanolol 20 mg twice daily and Imitrex spray. She has had to use her spray once in the last 2 months. She has obstructive sleep apnea and is on additional oxygen 2 L by BiPAP .she claims she is not aware of any migraine triggers except for weather changes. She needs refills today but wants  her medications printed out for her because she has been having trouble with mail order. She also has a new complaint of aching muscles and states she has been on Crestor for a while. She returns for reevaluation   HISTORY:  of obesity and migraine headache. The patient indicates that as long as she takes her Topamax and her propranolol, and she does not have migraine headaches. The patient may forget her medications for one or 2 days, and then she will get a migraine headache that is quite severe, and difficult to control. The patient has Imitrex tablets, but she indicates that she does not take this medication. The patient is more likely to have a headache if it is raining. The patient overall is doing fairly well. The patient returns for an evaluation.  REVIEW OF SYSTEMS: Full 14 system review of systems performed and notable only for those listed, all others are neg:  Constitutional: N/A  Cardiovascular: N/A  Ear/Nose/Throat: N/A  Skin: N/A  Eyes: N/A  Respiratory:  shortness of breath  Gastroitestinal: N/A Genitourinary: Urgency   Hematology/Lymphatic: N/A  Endocrine: N/A Musculoskeletal: aching muscles Allergy/Immunology: N/A  Neurological:  N/A Psychiatric: N/A   ALLERGIES: Allergies  Allergen Reactions  . Aspirin   . Lisinopril     REACTION: cough  . Penicillins     REACTION: rash  . Prednisone     HOME MEDICATIONS: Outpatient Prescriptions Prior to Visit  Medication Sig Dispense Refill  . acetaminophen (TYLENOL) 500 MG tablet Take 500 mg by mouth 3 (three) times daily as needed for pain.      . Calcium Carbonate-Vitamin D (CALCIUM 600 + D PO) Take 2 tablets by mouth daily.        . clopidogrel (PLAVIX) 75 MG tablet Take 1 tablet (75 mg total) by mouth daily.  90 tablet  0  . Colesevelam HCl (WELCHOL) 3.75 G PACK Use 1 packet daily.  90 each  3  . Ferrous Sulfate (IRON) 325 (65 FE) MG TABS Take 1 tablet by mouth daily.        . fexofenadine-pseudoephedrine (ALLEGRA-D ALLERGY & CONGESTION) 180-240 MG per 24 hr tablet Take 1 tablet by mouth daily.  90 tablet  3  . furosemide (LASIX) 20 MG tablet Take 1 tablet (20 mg total) by mouth daily.  90 tablet  0  . glucosamine-chondroitin 500-400 MG tablet Take 1 tablet by mouth 2 (two) times daily.        [name] losartan (COZAAR) 25 MG tablet Take 1 tablet (25 mg total) by mouth daily.  90 tablet  0  . metFORMIN (GLUCOPHAGE-XR) 500 MG 24 hr tablet Take 2 tablets (1,000 mg total) by mouth 2 (two) times daily.  180 tablet  0  . Multiple Vitamin (MULTIVITAMIN) tablet Take 1 tablet by mouth daily.        [name] omeprazole (PRILOSEC) 20 MG capsule Take 20 mg by mouth daily as needed.       . potassium chloride SA (K-DUR,KLOR-CON) 20 MEQ tablet Take 1 tablet (20 mEq total) by mouth daily.  90 tablet  3  . propranolol (INDERAL) 20 MG tablet Take 20 mg by mouth 2 (two) times daily. For migraines       . rosuvastatin (CRESTOR) 40 MG tablet Take 1 tablet (40 mg total) by mouth daily.  90 tablet  0  . SUMAtriptan (IMITREX) 20 MG/ACT nasal spray Place 1 spray (20 mg total) into the nose every 2 (two) hours as needed for migraine.  2 Inhaler  5  . topiramate (TOPAMAX) 100 MG tablet Take 100 mg by  mouth 2 (two) times daily.       . vitamin E 200 UNIT capsule Take 200 Units by mouth daily.      . naproxen sodium (ANAPROX) 220 MG tablet Take 220 mg by mouth 2 (two) times daily between meals as needed.       No facility-administered medications prior to visit.    PAST MEDICAL HISTORY: Past Medical History  Diagnosis Date  . ALLERGIC RHINITIS 10/22/2006  . ANEMIA 12/18/2008  . ASYMPTOMATIC POSTMENOPAUSAL STATUS 11/22/2007  . DIABETES MELLITUS, TYPE II 01/13/2007  . GERD 07/20/2007  . GOITER, MULTINODULAR 07/20/2007  . Headache(784.0) 07/20/2007  . HEARING LOSS 11/22/2007  . HYPERCHOLESTEROLEMIA 01/13/2007  . HYPERTENSION 10/22/2006  . OSTEOARTHRITIS 10/22/2006  . Other chronic nonalcoholic liver disease 3/41/9379  . Hyperglycemia   . NASH (nonalcoholic steatohepatitis)   . Fibroid   . Hx of colposcopy with cervical biopsy   . Abnormal Pap smear   . Complication of anesthesia   . Dyslipidemia   . Obesity   . Obstructive sleep apnea   . Degenerative arthritis   . Nocturnal hypoxemia 02/17/2013    PAST SURGICAL HISTORY: Past Surgical History  Procedure Laterality Date  . Esophagogastroduodenoscopy  12/08/2005  . Stress cardiolite  10/21/2005  . Electrocardiogram  10/15/2006  . Dexa  08/2005  . Wisdom tooth extraction    . Breast surgery      Breast reduction  . Sweat gland removal    . Dilation and curettage of uterus    . Eye surgery      FAMILY HISTORY: Family History  Problem Relation Age of Onset  . Cancer Mother     Breast Cancer, Colon Cancer  . Asthma Mother   . Depression Mother   . Bipolar disorder Mother   . Dementia Mother   . Arthritis Mother   . Hypertension Father   . Migraines Father   . Cancer Maternal Aunt   . Heart disease Maternal Grandmother   . Cancer Maternal Grandfather     SOCIAL HISTORY: History   Social History  . Marital Status: Married    Spouse Name: [name]    Number of Children: 0  . Years of Education: College    Occupational History  . Retired    Social History Main Topics  . Smoking status: Former Smoker -- 2.00 packs/day for 20 years    Types: Cigarettes    Quit date: 03/03/1979  . Smokeless tobacco: Never Used  . Alcohol Use: Yes     Comment: occasional   . Drug Use: No  . Sexual Activity: Yes    Birth  Control/ Protection: Surgical   Other Topics Concern  . Not on file   Social History Narrative   Patient lives at home with family.   Caffeine Use: 2 soda every other day     PHYSICAL EXAM  Filed Vitals:   04/03/13 1028  BP: 103/61  Pulse: 79  Height: 5' 5.25" (1.657 m)  Weight: 285 lb (129.275 kg)   Body mass index is 47.08 kg/(m^2).  Generalized: Well developed,  morbidly obese female in no acute distress  Head: normocephalic and atraumatic,. Oropharynx benign  Neck: Supple, no carotid bruits  Cardiac: Regular rate rhythm, no murmur  Skin: 1+ edema seen at the ankles.   Neurological examination   Mentation: Alert oriented to time, place, history taking. Follows all commands speech and language fluent  Cranial nerve II-XII: Pupils were equal round reactive to light extraocular movements were full, visual field were full on confrontational test. Facial sensation and strength were normal. hearing was intact to finger rubbing bilaterally. Uvula tongue midline. head turning and shoulder shrug were normal and symmetric.Tongue protrusion into cheek strength was normal. Motor: normal bulk and tone, full strength in the BUE, BLE, fine finger movements normal, no pronator drift. No focal weakness Coordination: finger-nose-finger, heel-to-shin bilaterally, no dysmetria Reflexes: Brachioradialis 2/2, biceps 2/2, triceps 2/2, patellar 2/2, Achilles 2/2, plantar responses were flexor bilaterally. Gait and Station: Rising up from seated position without assistance, wide based stance,  moderate stride, good arm swing, smooth turning, able to perform tiptoe, and heel walking without  difficulty. Tandem gait is mildly unsteady.  DIAGNOSTIC DATA (LABS, IMAGING, TESTING) - I reviewed patient records, labs, notes, testing and imaging myself where available.  Lab Results  Component Value Date   WBC 5.1 01/24/2013   HGB 12.1 01/24/2013   HCT 36.6 01/24/2013   MCV 88.2 01/24/2013   PLT 244.0 01/24/2013      Component Value Date/Time   NA 139 01/24/2013 0955   K 3.5 01/24/2013 0955   CL 105 01/24/2013 0955   CO2 27 01/24/2013 0955   GLUCOSE 97 01/24/2013 0955   BUN 12 01/24/2013 0955   CREATININE 0.8 01/24/2013 0955   CREATININE 0.81 12/28/2011 0853   CALCIUM 9.1 01/24/2013 0955   PROT 7.8 01/24/2013 0955   ALBUMIN 3.3* 01/24/2013 0955   AST 15 01/24/2013 0955   ALT 11 01/24/2013 0955   ALKPHOS 61 01/24/2013 0955   BILITOT 0.2* 01/24/2013 0955   GFRNONAA 94.49 12/13/2009 0908   GFRAA 94 11/11/2007 0912   Lab Results  Component Value Date   CHOL 149 01/24/2013   HDL 55.00 01/24/2013   LDLCALC 66 01/24/2013   TRIG 139.0 01/24/2013   CHOLHDL 3 01/24/2013   Lab Results  Component Value Date   HGBA1C 6.8* 01/24/2013   Lab Results  Component Value Date   VITAMINB12 563 12/25/2010   Lab Results  Component Value Date   TSH 1.85 01/24/2013      ASSESSMENT AND PLAN  68 y.o. year old female  has a past medical history of migraine headaches and obstructive sleep apnea. She is currently on Topamax 100 twice daily and propanolol 20 twice daily with good control of her headaches however she says her Topamax dose is 300. She is on Imitrex spray  Acutely. She has a new complaint of muscle achiness.    She will continue her Topamax and propanolol as directed Imitrex spray when necessary RX were printed for patient per request For  muscle achiness will check CK level  F/U 6 months Dennie Bible, [facility], [facility], APRN  [facility] [address], [place], [place] [address] [phone]

## 2013-04-03 NOTE — Telephone Encounter (Signed)
Called patient and patient stated that she was taking Toprimate 50 mg 3 tablets at night and propranolol 60 mg 1 tablet twice a day. I inform the patient that the prescriptions that she was getting now is Toprimate 100 mg 1 tablet twice daily and propranolol 20 mg 1 tablet twice daily. I advised the patient that if she has any other problems, questions or concerns to call the office. Patient verbalized understanding.

## 2013-04-03 NOTE — Patient Instructions (Signed)
She will continue her Topamax and propanolol as directed Imitrex spray when necessary For  muscle achiness will check CK level F/U 6 months

## 2013-04-03 NOTE — Progress Notes (Signed)
I have read the note, and I agree with the clinical assessment and plan.  Heather [name],Heather [name]   

## 2013-04-03 NOTE — Telephone Encounter (Signed)
Patient calling to state that she wants to give more information regarding her visit today. Patient was vague about what it was about. Please call patient back.

## 2013-04-20 DIAGNOSIS — Z01419 Encounter for gynecological examination (general) (routine) without abnormal findings: Secondary | ICD-10-CM | POA: Diagnosis not present

## 2013-04-20 DIAGNOSIS — Z124 Encounter for screening for malignant neoplasm of cervix: Secondary | ICD-10-CM | POA: Diagnosis not present

## 2013-04-20 LAB — HM PAP SMEAR: HM PAP: NORMAL

## 2013-04-28 ENCOUNTER — Encounter: Payer: Self-pay | Admitting: Endocrinology

## 2013-05-02 ENCOUNTER — Ambulatory Visit (INDEPENDENT_AMBULATORY_CARE_PROVIDER_SITE_OTHER): Payer: Medicare Other | Admitting: Endocrinology

## 2013-05-02 ENCOUNTER — Encounter: Payer: Self-pay | Admitting: Neurology

## 2013-05-02 VITALS — BP 112/78 | HR 74 | Temp 97.6°F | Ht 65.25 in | Wt 285.0 lb

## 2013-05-02 DIAGNOSIS — M2550 Pain in unspecified joint: Secondary | ICD-10-CM | POA: Diagnosis not present

## 2013-05-02 DIAGNOSIS — E119 Type 2 diabetes mellitus without complications: Secondary | ICD-10-CM

## 2013-05-02 DIAGNOSIS — IMO0001 Reserved for inherently not codable concepts without codable children: Secondary | ICD-10-CM | POA: Diagnosis not present

## 2013-05-02 LAB — HEMOGLOBIN A1C: Hgb A1c MFr Bld: 6.9 % — ABNORMAL HIGH (ref 4.6–6.5)

## 2013-05-02 LAB — URIC ACID: URIC ACID, SERUM: 2.8 mg/dL (ref 2.4–7.0)

## 2013-05-02 LAB — SEDIMENTATION RATE: Sed Rate: 75 mm/hr — ABNORMAL HIGH (ref 0–22)

## 2013-05-02 NOTE — Progress Notes (Signed)
Subjective:    Patient ID: Heather [name], female    DOB: [birth year], 68 y.o.   MRN: [id number]  HPI Pt states few mos of moderate pain at both thighs, and assoc pain at the lower back.  Past Medical History  Diagnosis Date  . ALLERGIC RHINITIS 10/22/2006  . ANEMIA 12/18/2008  . ASYMPTOMATIC POSTMENOPAUSAL STATUS 11/22/2007  . DIABETES MELLITUS, TYPE II 01/13/2007  . GERD 07/20/2007  . GOITER, MULTINODULAR 07/20/2007  . Headache(784.0) 07/20/2007  . HEARING LOSS 11/22/2007  . HYPERCHOLESTEROLEMIA 01/13/2007  . HYPERTENSION 10/22/2006  . OSTEOARTHRITIS 10/22/2006  . Other chronic nonalcoholic liver disease 9/74/1638  . Hyperglycemia   . NASH (nonalcoholic steatohepatitis)   . Fibroid   . Hx of colposcopy with cervical biopsy   . Abnormal Pap smear   . Complication of anesthesia   . Dyslipidemia   . Obesity   . Obstructive sleep apnea   . Degenerative arthritis   . Nocturnal hypoxemia 02/17/2013    Past Surgical History  Procedure Laterality Date  . Esophagogastroduodenoscopy  12/08/2005  . Stress cardiolite  10/21/2005  . Electrocardiogram  10/15/2006  . Dexa  08/2005  . Wisdom tooth extraction    . Breast surgery      Breast reduction  . Sweat gland removal    . Dilation and curettage of uterus    . Eye surgery      History   Social History  . Marital Status: Married    Spouse Name: [name]    Number of Children: 0  . Years of Education: College   Occupational History  . Retired    Social History Main Topics  . Smoking status: Former Smoker -- 2.00 packs/day for 20 years    Types: Cigarettes    Quit date: 03/03/1979  . Smokeless tobacco: Never Used  . Alcohol Use: Yes     Comment: occasional   . Drug Use: No  . Sexual Activity: Yes    Birth Control/ Protection: Surgical   Other Topics Concern  . Not on file   Social History Narrative   Patient lives at home with family.   Caffeine Use: 2 soda every other day    Current Outpatient Prescriptions on  File Prior to Visit  Medication Sig Dispense Refill  . acetaminophen (TYLENOL) 500 MG tablet Take 500 mg by mouth 3 (three) times daily as needed for pain.      . Calcium Carbonate-Vitamin D (CALCIUM 600 + D PO) Take 2 tablets by mouth daily.        . clopidogrel (PLAVIX) 75 MG tablet Take 1 tablet (75 mg total) by mouth daily.  90 tablet  0  . Colesevelam HCl (WELCHOL) 3.75 G PACK Use 1 packet daily.  90 each  3  . Ferrous Sulfate (IRON) 325 (65 FE) MG TABS Take 1 tablet by mouth daily.        . fexofenadine-pseudoephedrine (ALLEGRA-D ALLERGY & CONGESTION) 180-240 MG per 24 hr tablet Take 1 tablet by mouth daily.  90 tablet  3  . furosemide (LASIX) 20 MG tablet Take 1 tablet (20 mg total) by mouth daily.  90 tablet  0  . glucosamine-chondroitin 500-400 MG tablet Take 1 tablet by mouth 2 (two) times daily.        [name] losartan (COZAAR) 25 MG tablet Take 1 tablet (25 mg total) by mouth daily.  90 tablet  0  . metFORMIN (GLUCOPHAGE-XR) 500 MG 24 hr tablet Take 2 tablets (1,000 mg total) by  mouth 2 (two) times daily.  180 tablet  0  . Multiple Vitamin (MULTIVITAMIN) tablet Take 1 tablet by mouth daily.        [name] omeprazole (PRILOSEC) 20 MG capsule Take 20 mg by mouth daily as needed.       . potassium chloride SA (K-DUR,KLOR-CON) 20 MEQ tablet Take 1 tablet (20 mEq total) by mouth daily.  90 tablet  3  . propranolol (INDERAL) 20 MG tablet Take 1 tablet (20 mg total) by mouth 2 (two) times daily. For migraines  180 tablet  1  . rosuvastatin (CRESTOR) 40 MG tablet Take 1 tablet (40 mg total) by mouth daily.  90 tablet  0  . SUMAtriptan (IMITREX) 20 MG/ACT nasal spray Place 1 spray (20 mg total) into the nose every 2 (two) hours as needed for migraine.  12 Inhaler  1  . topiramate (TOPAMAX) 100 MG tablet Take 1 tablet (100 mg total) by mouth 2 (two) times daily.  180 tablet  1  . vitamin E 200 UNIT capsule Take 200 Units by mouth daily.       No current facility-administered medications on file prior to  visit.    Allergies  Allergen Reactions  . Aspirin   . Lisinopril     REACTION: cough  . Penicillins     REACTION: rash  . Prednisone     Family History  Problem Relation Age of Onset  . Cancer Mother     Breast Cancer, Colon Cancer  . Asthma Mother   . Depression Mother   . Bipolar disorder Mother   . Dementia Mother   . Arthritis Mother   . Hypertension Father   . Migraines Father   . Cancer Maternal Aunt   . Heart disease Maternal Grandmother   . Cancer Maternal Grandfather     BP 112/78  Pulse 74  Temp(Src) 97.6 F (36.4 C) (Oral)  Ht 5' 5.25" (1.657 m)  Wt 285 lb (129.275 kg)  BMI 47.08 kg/m2  SpO2 98%   Review of Systems Denies numbness.  Denies bowel or bladder retention.      Objective:   Physical Exam VITAL SIGNS:  See vs page GENERAL: no distress Thighs: nontender spine: nontender Neuro: sensation is intact to touch throughout the LE's.  Gait: normal and steady.     Lab Results  Component Value Date   HGBA1C 6.9* 05/02/2013  ESR=elevated    Assessment & Plan:  Leg pain, new, possibly due to PMR DM: well-controlled

## 2013-05-02 NOTE — Patient Instructions (Addendum)
blood tests are being requested for you today.  We'll contact you with results. Refer to a specialist.  you will receive a phone call, about a day and time for an appointment

## 2013-05-03 ENCOUNTER — Encounter: Payer: Self-pay | Admitting: Neurology

## 2013-05-15 ENCOUNTER — Ambulatory Visit: Payer: Medicare Other | Admitting: Family Medicine

## 2013-05-16 ENCOUNTER — Ambulatory Visit (INDEPENDENT_AMBULATORY_CARE_PROVIDER_SITE_OTHER): Payer: Medicare Other | Admitting: Family Medicine

## 2013-05-16 ENCOUNTER — Encounter: Payer: Self-pay | Admitting: Family Medicine

## 2013-05-16 ENCOUNTER — Other Ambulatory Visit (INDEPENDENT_AMBULATORY_CARE_PROVIDER_SITE_OTHER): Payer: Medicare Other

## 2013-05-16 VITALS — BP 122/70 | HR 80 | Wt 285.0 lb

## 2013-05-16 DIAGNOSIS — M6289 Other specified disorders of muscle: Secondary | ICD-10-CM

## 2013-05-16 DIAGNOSIS — M79609 Pain in unspecified limb: Secondary | ICD-10-CM

## 2013-05-16 DIAGNOSIS — M79605 Pain in left leg: Secondary | ICD-10-CM

## 2013-05-16 DIAGNOSIS — R29898 Other symptoms and signs involving the musculoskeletal system: Secondary | ICD-10-CM

## 2013-05-16 DIAGNOSIS — M79604 Pain in right leg: Secondary | ICD-10-CM

## 2013-05-16 DIAGNOSIS — IMO0001 Reserved for inherently not codable concepts without codable children: Secondary | ICD-10-CM

## 2013-05-16 LAB — C-REACTIVE PROTEIN: CRP: 0.5 mg/dL (ref 0.5–20.0)

## 2013-05-16 MED ORDER — METHYLPREDNISOLONE ACETATE 80 MG/ML IJ SUSP
80.0000 mg | Freq: Once | INTRAMUSCULAR | Status: AC
  Administered 2013-05-16: 80 mg via INTRAMUSCULAR

## 2013-05-16 NOTE — Assessment & Plan Note (Signed)
Patient does have some myalgia and myositis is. Labs were reviewed by me and patient did have an elevated sedimentation rate. 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 We discussed differential including secondary to her diabetes she has good control as well as recent illnesses which patient states that she has not had. Patient also has no signs of infection. This unfortunately makes me concerned for more of a autoimmune process such as polymyalgia rheumatica, lupus, rheumatoid arthritis. It does not appear the patient has any significant arthropathy associated with this problem. Discussed with patient though that I would like to get autoimmune labs today. Patient Heather [name] has an appointment with a rheumatologist which I think would be a good idea. We will get patient a shot of Depo-Medrol and see if she has any improvement. Patient knows to tell the rheumatologist if she does have a good response. We will release patient's labs in my chart when received Patient was given a compression sleeve today which I think will be helpful and she will get another one over-the-counter. Discussed different exercises and different food choices they can be beneficial. Patient and can followup with me if there is anything else I can be helpful for.

## 2013-05-16 NOTE — Progress Notes (Signed)
[name] [address] [place], [place] [address] Phone: [phone] Subjective:    I'm seeing this patient by the request  of:  [name], MD   CC: Myalgia  [id number] Heather [name] is a 68 y.o. female coming in with complaint of muscle aches and pains. Patient says most of the pain seems to be him in the thighs bilaterally. Patient is found it very hard and having some weakness of the legs from time to time. Patient states that she an ache and pains in all her muscles from time to time as well. Patient states that this has gotten so bad from time to time she does not feel like angulated. Patient states it seems to be mostly on the lateral aspect of the quadriceps. Patient states it saw her more with a relation to can give her a throbbing sensation at rest as well. Patient denies radiation past the knee. States that it does seem like it can swell. Patient puts the pain severity of 8/10. Patient has an aspirin allergies it is not taking anything other than naproxen with minimal relief.     Past medical history, social, surgical and family history all reviewed in electronic medical record.   Review of Systems: No headache, visual changes, nausea, vomiting, diarrhea, constipation, dizziness, abdominal pain, skin rash, fevers, chills, night sweats, weight loss, swollen lymph nodes, body aches, joint swelling, muscle aches, chest pain, shortness of breath, mood changes.   Objective Blood pressure 122/70, pulse 80, weight 285 lb (129.275 kg), SpO2 98.00%.  General: No apparent distress alert and oriented x3 mood and affect normal, dressed appropriately. Overweight HEENT: Pupils equal, extraocular movements intact  Respiratory: Patient's speak in full sentences and does not appear short of breath  Cardiovascular: No lower extremity edema, non tender, no erythema  Skin: Warm dry intact with no signs of infection or rash on extremities or on axial  skeleton.  Abdomen: Soft nontender  Neuro: Cranial nerves II through XII are intact, neurovascularly intact in all extremities with 2+ DTRs and 2+ pulses.  Lymph: No lymphadenopathy of posterior or anterior cervical chain or axillae bilaterally.  Gait normal with good balance and coordination.  MSK:  Non tender with full range of motion and good stability and symmetric strength and tone of shoulders, elbows, wrist, hip, knee and ankles bilaterally.  Testing no patient's quadriceps patient does have some mild weakness of the quadriceps bilaterally. Patient is sore to palpation of the quadriceps bilaterally. Mostly over the vastus lateralis. Neurovascular intact distally. Deep tendon reflexes are 2+ and symmetric. No true swelling noted of the musculature. Back Exam:  Inspection: Unremarkable  Motion: Flexion 45 deg, Extension 45 deg, Side Bending to 45 deg bilaterally,  Rotation to 45 deg bilaterally  SLR laying: Negative  XSLR laying: Negative  Palpable tenderness: None. FABER: negative. Sensory change: Gross sensation intact to all lumbar and sacral dermatomes.  Reflexes: 2+ at both patellar tendons, 2+ at achilles tendons, Babinski's downgoing.  Strength at foot  Plantar-flexion: 5/5 Dorsi-flexion: 5/5 Eversion: 5/5 Inversion: 5/5  Leg strength  Quad: 5/5 Hamstring: 5/5 Hip flexor: 5/5 Hip abductors: 5/5  Gait unremarkable.   Limited musculoskeletal ultrasound was performed and interpreted by Hulan Saas, M Patient's ultrasound of the vastus lateralis bilaterally shows the patient does have hypoechoic changes in surrounding the muscle fibrils which allows for diagnosis of myositis. Patient IT band is normal. No true tear appreciated in the musculature.    Impression and Recommendations:  This case required medical decision making of moderate complexity.

## 2013-05-16 NOTE — Patient Instructions (Signed)
Very nice to meet you Compression sleeve can help, you can get them at [facility] I do think following up with rheumatology would be a good idea.  I am giving you a depomedrol injection and I think it will help.  If you need anything I am here for you.

## 2013-05-16 NOTE — Addendum Note (Signed)
Addended by: Douglass Rivers T on: 05/16/2013 04:29 PM   Modules accepted: Orders

## 2013-05-17 LAB — ANGIOTENSIN CONVERTING ENZYME: ANGIOTENSIN-CONVERTING ENZYME: 37 U/L (ref 8–52)

## 2013-05-17 LAB — PAN-ANCA
Atypical p-ANCA Screen: NEGATIVE
Serine Protease 3: <1
c-ANCA Screen: NEGATIVE
p-ANCA Screen: NEGATIVE

## 2013-05-17 LAB — ANA COMPREHENSIVE PANEL
Centromere Ab Screen: <0.2 AI (ref 0.0–0.9)
ENA RNP Ab: <0.2 AI (ref 0.0–0.9)
ENA SSA (RO) Ab: <0.2 AI (ref 0.0–0.9)
ENA SSB (LA) Ab: <0.2 AI (ref 0.0–0.9)
Scleroderma SCL-70: <0.2 AI (ref 0.0–0.9)
dsDNA Ab: <1 IU/mL (ref 0–9)

## 2013-05-17 LAB — SJOGRENS SYNDROME-B EXTRACTABLE NUCLEAR ANTIBODY: SSB (La) (ENA) Antibody, IgG: <1

## 2013-05-17 LAB — ANCA SCREEN W REFLEX TITER
Atypical p-ANCA Screen: NEGATIVE
C-ANCA SCREEN: NEGATIVE
P-ANCA SCREEN: NEGATIVE

## 2013-05-17 LAB — ANTI-SMOOTH MUSCLE/MITOCHOND.
Mitochondrial Ab: 10.3 Units (ref 0.0–20.0)
Smooth Muscle Ab: 13 Units (ref 0–19)

## 2013-05-17 LAB — ANTI-DNA ANTIBODY, DOUBLE-STRANDED

## 2013-05-17 LAB — SJOGRENS SYNDROME-A EXTRACTABLE NUCLEAR ANTIBODY: SSA (RO) (ENA) ANTIBODY, IGG: NEGATIVE

## 2013-05-17 LAB — RHEUMATOID FACTOR: Rhuematoid fact SerPl-aCnc: <10 IU/mL (ref <=14)

## 2013-05-22 ENCOUNTER — Telehealth: Payer: Self-pay | Admitting: Endocrinology

## 2013-05-22 MED ORDER — POTASSIUM CHLORIDE CRYS ER 20 MEQ PO TBCR: 20.0000 meq | EXTENDED_RELEASE_TABLET | Freq: Every day | ORAL | Status: DC

## 2013-05-22 NOTE — Telephone Encounter (Signed)
Done

## 2013-05-22 NOTE — Telephone Encounter (Signed)
Pt needs potassium chloride XR called into [facility]. Please!

## 2013-05-23 ENCOUNTER — Telehealth: Payer: Self-pay | Admitting: *Deleted

## 2013-05-23 NOTE — Telephone Encounter (Signed)
Script for Potassium faxed to [facility].

## 2013-05-24 DIAGNOSIS — M79609 Pain in unspecified limb: Secondary | ICD-10-CM | POA: Diagnosis not present

## 2013-05-24 DIAGNOSIS — M5137 Other intervertebral disc degeneration, lumbosacral region: Secondary | ICD-10-CM | POA: Diagnosis not present

## 2013-05-24 DIAGNOSIS — IMO0002 Reserved for concepts with insufficient information to code with codable children: Secondary | ICD-10-CM | POA: Diagnosis not present

## 2013-05-24 DIAGNOSIS — M47817 Spondylosis without myelopathy or radiculopathy, lumbosacral region: Secondary | ICD-10-CM | POA: Diagnosis not present

## 2013-05-26 ENCOUNTER — Ambulatory Visit (INDEPENDENT_AMBULATORY_CARE_PROVIDER_SITE_OTHER)
Admission: RE | Admit: 2013-05-26 | Discharge: 2013-05-26 | Disposition: A | Discharge status: Home / Self Care | Payer: Medicare Other | Source: Ambulatory Visit | Attending: Internal Medicine | Admitting: Internal Medicine

## 2013-05-26 DIAGNOSIS — R911 Solitary pulmonary nodule: Secondary | ICD-10-CM

## 2013-05-29 ENCOUNTER — Telehealth: Payer: Self-pay | Admitting: Internal Medicine

## 2013-05-29 NOTE — Telephone Encounter (Signed)
Give her fu to discuss Ct results that are basically unchanged from a  Year ago    Dr. Brand Males, M.D., [facility].C.P Pulmonary and Critical Care Medicine Staff Physician [place] [facility] Pager: [phone], If no answer or between  15:00h - 7:00h: call [phone]  05/29/2013 12:31 AM    For my use   CT chest 05/26/13  Right lobe of the thyroid is asymmetrically enlarged and slightly deviates the trachea to the left, as before. - CAN EXPLAIN Dyspnea, Need to see what her endocrinologist feels   . Large hiatal hernia contains stomach and colon. - ? Surgical candidate  Scarring and volume loss in the posterior segment right upper lobe and right lower lobe, adjacent to the large hiatal hernia described above. A 6 mm (5 x 7 mm) nodule in the left lower lobe (image 23) is unchanged from 08/03/2012.  - 1  Year FU RECOMMENDED  Left anterior descending coronary artery calcification. - NEED TO REVIEW DR Einar Gip NOTES

## 2013-05-30 DIAGNOSIS — M545 Low back pain, unspecified: Secondary | ICD-10-CM | POA: Diagnosis not present

## 2013-06-01 NOTE — Telephone Encounter (Signed)
Called and spoke with pt and she is aware of appt with MR on 4/20 at 1:30 to review the CT results.  Nothing further is needed.

## 2013-06-01 NOTE — Telephone Encounter (Signed)
Pt returning call pls call @ [phone].Hillery Hunter

## 2013-06-01 NOTE — Telephone Encounter (Signed)
[id number] on home and cell #.  Bing, CMA

## 2013-06-02 DIAGNOSIS — M545 Low back pain, unspecified: Secondary | ICD-10-CM | POA: Diagnosis not present

## 2013-06-03 ENCOUNTER — Other Ambulatory Visit: Payer: Self-pay | Admitting: Endocrinology

## 2013-06-05 DIAGNOSIS — M545 Low back pain, unspecified: Secondary | ICD-10-CM | POA: Diagnosis not present

## 2013-06-09 DIAGNOSIS — M545 Low back pain, unspecified: Secondary | ICD-10-CM | POA: Diagnosis not present

## 2013-06-13 DIAGNOSIS — M545 Low back pain, unspecified: Secondary | ICD-10-CM | POA: Diagnosis not present

## 2013-06-15 DIAGNOSIS — M545 Low back pain, unspecified: Secondary | ICD-10-CM | POA: Diagnosis not present

## 2013-06-19 ENCOUNTER — Ambulatory Visit (INDEPENDENT_AMBULATORY_CARE_PROVIDER_SITE_OTHER): Payer: Medicare Other | Admitting: Internal Medicine

## 2013-06-19 ENCOUNTER — Telehealth: Payer: Self-pay | Admitting: Internal Medicine

## 2013-06-19 VITALS — BP 130/72 | HR 92 | Ht 65.0 in | Wt 282.0 lb

## 2013-06-19 DIAGNOSIS — R911 Solitary pulmonary nodule: Secondary | ICD-10-CM | POA: Diagnosis not present

## 2013-06-19 DIAGNOSIS — R0989 Other specified symptoms and signs involving the circulatory and respiratory systems: Secondary | ICD-10-CM

## 2013-06-19 DIAGNOSIS — R0609 Other forms of dyspnea: Secondary | ICD-10-CM | POA: Diagnosis not present

## 2013-06-19 DIAGNOSIS — K449 Diaphragmatic hernia without obstruction or gangrene: Secondary | ICD-10-CM

## 2013-06-19 DIAGNOSIS — R0600 Dyspnea, unspecified: Secondary | ICD-10-CM

## 2013-06-19 NOTE — Progress Notes (Signed)
Subjective:    Patient ID: Heather [name], female    DOB: 01/29/1946, 68 y.o.   MRN: [id number] Heather Curry, MD  HPI   IOV 07/12/2012  Cc.  AT baseline her nose and lungs are sensitive to cooking smoke (can sneeze and cough) but never had dyspnea. However, now has NEW Dyspnea. Insidious onset. SEverl months x 6 month. EXertional dyspnea for stairs or ADLs. Improved with rest and passage of time after resting. She tried advair once but made her tachycardic and so has refused to try it again (used it for 10 days). STable course. Current dyspnea: she is unsure if smoke or weather change makes it worse. Husband strongly feels exertion is main trigger for dyspnea. THere is no associated chest pain, cough, but there is some wheeze which is only occassional. THere is no change in 4 pillow orthopnea (sleep apnea). No pnd.  No weight loss   DYspnea Relevant hx  -She is morbidly obese. SHe is at 295# wuth BMI 50.7. HAs gained 45# in last 3 year - SHe has sleep apnea and migraines - sees dR Heather [name]  But not on cpap. Unclear why not on CPAP - LArge Hiatal Hernia - seen on CT 2003  And cxr 2007. GERD: has frequent symptoms - Ex smoker - 40 pack. Quit in 80s    LAbs June 2003 -> Aug 2003 CT chest: 8m LLL nodule without change -> CXR 2007: No mass    06/02/12 spirometry at pcp office - sevee obstruction. FEv1 0.8L/40%, a Ratio 33  REC #travel  - be wary of risk of blood clot to lung or heart with prolonged sitting during travel  - wear compression stockings and move yourself frequently  #shortness of breath and lung nodule  - have overnight oxygen study, ct chest and pft breathing test  #Followup  - return after test less than 1 month    OV 08/24/2012 Fu fo rresults for dyspnea workup. No new isues. Dyspnea stable and unchanged   Overnight oxygen saturation test done 08/04/2012 shows pulse ox less than 88% for 5 hours and 55 minutes and 32 seconds which is 72% of the sleep awake low  was 80% awake high was 92%. Overall high was 98% overall low 61%. Continuous saturation of less than 80% was 34 minutes and 52 seconds. She is still not sure why neurologist did not start her on CPAP and why she is not on nocturnal o2   PFT 08/24/12: Fev` 1.2L/61%, No BD response., RAtio 82/104%, Small airway 70%, TLC 61%, DLCO 20/82%. - restriction with normal DLCO   reports that she quit smoking about 34 years ago. Her smoking use included Cigarettes. She has a 40 pack-year smoking history. She has never used smokeless tobacco.   CT shows chest 08/03/12 thyroid nodule 4.1cm; same as in 2009 CT , patient aware of it and follows with pcp, recollects bx   - nodule 7355mLL  - no ild - Co calcification - hiatal hernia - very large  REC #Coronary artery calcification  - see Dr Heather [name] #Lung nodule - 55m31mLL  - radiologist recommended followup scan in 9 months \  #Overnight oxygen desaturation   pleaes talk to Dr WilJannifer Franklinout retest for sleep apnea and starting CPAP treatment; you desaturate a lot   #Followup  1-2 months to regroup   OV 06/19/2013  Chief Complaint  Patient presents with  . Follow-up    discuss CT results.  Followup - Dyspnea in the setting of morbid obesity, large hiatal hernia, restricted PFTs with normal diffusion capacity and normal lung parenchyma on CT scan of the chest but also obstructyive spirometry on one other occasion,, chronic beta blocker intake for migraine and chronic coronary artery calcification with negative stress test August 2014 and sleep apnea    - . She is doing well bur ROS positive for dyspnea. There is no wheezing or cough. She recollected sometime in the remote past she was tried on any inhaler therapy but she got tachycardic but she does not know the name for this. In any event overall she's doing well. I noticed that she is on propranolol which he states is for chronic migraine for many years. Apparently this is helping her. She also does  be that a gastroenterologist has not evaluated her hiatal hernia and a long time and she continues to have significant acid reflux symptoms. She did have cardiac stress test August 2014 and this was normal  -Pulmonary nodule left lower lobe 7 mm note is June 2014   - Results are below and basically unchanged    CT chest 05/26/13 IMPRESSION:  1. Left lower lobe nodule is unchanged from 08/03/2012. Given the  patient's extensive smoking history, additional followup in 1 year  could be performed to ensure continued stability, as clinically  indicated. This recommendation follows the consensus statement:  Guidelines for Management of Small Pulmonary Nodules Detected on CT  Scans: A Statement from the [employer] as published in  Radiology 2005; 237:395-400.  2. Left anterior descending coronary artery calcification.  3. Large hiatal hernia containing stomach and colon.  4. Asymmetrically enlarged right thyroid, as before.  Electronically Signed  By: Heather [name] M.D.  On: 05/26/2013 13:08   Review of Systems  Constitutional: Negative for fever and unexpected weight change.  HENT: Positive for sneezing. Negative for congestion, dental problem, ear pain, nosebleeds, postnasal drip, rhinorrhea, sinus pressure, sore throat and trouble swallowing.   Eyes: Negative for redness and itching.  Respiratory: Positive for shortness of breath. Negative for cough, chest tightness and wheezing.   Cardiovascular: Positive for leg swelling. Negative for palpitations.  Gastrointestinal: Negative for nausea and vomiting.  Genitourinary: Negative for dysuria.  Musculoskeletal: Negative for joint swelling.  Skin: Negative for rash.  Neurological: Positive for headaches.  Hematological: Does not bruise/bleed easily.  Psychiatric/Behavioral: Negative for dysphoric mood. The patient is not nervous/anxious.    Current outpatient prescriptions:acetaminophen (TYLENOL) 500 MG tablet, Take 500 mg by  mouth 3 (three) times daily as needed for pain., Disp: , Rfl: ;  Calcium Carbonate-Vitamin D (CALCIUM 600 + D PO), Take 2 tablets by mouth daily.  , Disp: , Rfl: ;  clopidogrel (PLAVIX) 75 MG tablet, TAKE 1 TABLET EVERY DAY, Disp: 90 tablet, Rfl: 0;  Colesevelam HCl (WELCHOL) 3.75 G PACK, Use 1 packet daily., Disp: 90 each, Rfl: 3 CRESTOR 40 MG tablet, TAKE 1 TABLET EVERY DAY, Disp: 90 tablet, Rfl: 0;  Ferrous Sulfate (IRON) 325 (65 FE) MG TABS, Take 1 tablet by mouth daily.  , Disp: , Rfl: ;  fexofenadine-pseudoephedrine (ALLEGRA-D ALLERGY & CONGESTION) 180-240 MG per 24 hr tablet, Take 1 tablet by mouth daily., Disp: 90 tablet, Rfl: 3;  furosemide (LASIX) 20 MG tablet, TAKE 1 TABLET EVERY DAY, Disp: 90 tablet, Rfl: 0 glucosamine-chondroitin 500-400 MG tablet, Take 1 tablet by mouth 2 (two) times daily.  , Disp: , Rfl: ;  losartan (COZAAR) 25 MG tablet, TAKE 1 TABLET EVERY  DAY, Disp: 90 tablet, Rfl: 0;  metFORMIN (GLUCOPHAGE-XR) 500 MG 24 hr tablet, Take 2 tablets (1,000 mg total) by mouth 2 (two) times daily., Disp: 180 tablet, Rfl: 0;  Multiple Vitamin (MULTIVITAMIN) tablet, Take 1 tablet by mouth daily.  , Disp: , Rfl:  Nutritional Supplements (PEPTAMEN 1.5 PO), Take by mouth 2 (two) times daily., Disp: , Rfl: ;  potassium chloride SA (K-DUR,KLOR-CON) 20 MEQ tablet, Take 1 tablet (20 mEq total) by mouth daily., Disp: 90 tablet, Rfl: 3;  propranolol (INDERAL) 20 MG tablet, Take 1 tablet (20 mg total) by mouth 2 (two) times daily. For migraines, Disp: 180 tablet, Rfl: 1 SUMAtriptan (IMITREX) 20 MG/ACT nasal spray, Place 1 spray (20 mg total) into the nose every 2 (two) hours as needed for migraine., Disp: 12 Inhaler, Rfl: 1;  topiramate (TOPAMAX) 100 MG tablet, Take 1 tablet (100 mg total) by mouth 2 (two) times daily., Disp: 180 tablet, Rfl: 1;  vitamin E 200 UNIT capsule, Take 200 Units by mouth daily., Disp: , Rfl: ;  omeprazole (PRILOSEC) 20 MG capsule, Take 20 mg by mouth daily as needed. , Disp: , Rfl:       Objective:   Physical Exam  Vitals reviewed. Constitutional: She is oriented to person, place, and time. She appears well-developed and well-nourished. No distress.  Body mass index is 46.93 kg/(m^2).   HENT:  Head: Normocephalic and atraumatic.  Right Ear: External ear normal.  Left Ear: External ear normal.  Mouth/Throat: Oropharynx is clear and moist. No oropharyngeal exudate.  Eyes: Conjunctivae and EOM are normal. Pupils are equal, round, and reactive to light. Right eye exhibits no discharge. Left eye exhibits no discharge. No scleral icterus.  Neck: Normal range of motion. Neck supple. No JVD present. No tracheal deviation present. No thyromegaly present.  Cardiovascular: Normal rate, regular rhythm, normal heart sounds and intact distal pulses.  Exam reveals no gallop and no friction rub.   No murmur heard. Pulmonary/Chest: Effort normal and breath sounds normal. No respiratory distress. She has no wheezes. She has no rales. She exhibits no tenderness.  Abdominal: Soft. Bowel sounds are normal. She exhibits no distension and no mass. There is no tenderness. There is no rebound and no guarding.  Musculoskeletal: Normal range of motion. She exhibits no edema and no tenderness.  Lymphadenopathy:    She has no cervical adenopathy.  Neurological: She is alert and oriented to person, place, and time. She has normal reflexes. No cranial nerve deficit. She exhibits normal muscle tone. Coordination normal.  Skin: Skin is warm and dry. No rash noted. She is not diaphoretic. No erythema. No pallor.  Psychiatric: She has a normal mood and affect. Her behavior is normal. Judgment and thought content normal.          Assessment & Plan:

## 2013-06-19 NOTE — Telephone Encounter (Signed)
Left message for patient to call back  

## 2013-06-19 NOTE — Patient Instructions (Addendum)
#  Shortness of breath  - Currently this is mild and is probably related to your weight, physical deconditioning and possibly the hiatal hernia and propranolol - Refer Gastroenterology for hiatal hernia referral - I will write to your neurologist and recommend you stop your propranolol - We will followup your status in a few months and depending on your response to the above we'll consider empiric inhaler therapy Possibly Spiriva or inhaled steroids  #Lung nodule 7 mm left lower lobe  - Do followup low-dose CT scan of the chest in April 2016  #Followup  - 3 months from now or sooner if needed

## 2013-06-19 NOTE — Telephone Encounter (Signed)
Patient with large hiatal hernia containing stomach and colon.  She will come in and see Nicoletta Ba PA tomorrow at 11:00

## 2013-06-20 ENCOUNTER — Other Ambulatory Visit: Payer: Self-pay | Admitting: *Deleted

## 2013-06-20 ENCOUNTER — Encounter: Payer: Self-pay | Admitting: Physician Assistant

## 2013-06-20 ENCOUNTER — Ambulatory Visit (INDEPENDENT_AMBULATORY_CARE_PROVIDER_SITE_OTHER): Payer: Medicare Other | Admitting: Physician Assistant

## 2013-06-20 ENCOUNTER — Telehealth: Payer: Self-pay | Admitting: *Deleted

## 2013-06-20 VITALS — BP 108/74 | HR 80 | Ht 65.0 in | Wt 288.0 lb

## 2013-06-20 DIAGNOSIS — Z800 Family history of malignant neoplasm of digestive organs: Secondary | ICD-10-CM

## 2013-06-20 DIAGNOSIS — K449 Diaphragmatic hernia without obstruction or gangrene: Secondary | ICD-10-CM

## 2013-06-20 DIAGNOSIS — Z7901 Long term (current) use of anticoagulants: Secondary | ICD-10-CM

## 2013-06-20 DIAGNOSIS — M545 Low back pain, unspecified: Secondary | ICD-10-CM | POA: Diagnosis not present

## 2013-06-20 NOTE — Patient Instructions (Addendum)
We have scheduled you for a Cologuard test .  You will be notified from [employer].  You will receive a package in the mail with instructions.  You will follow the instructions and send the package back to [employer].  It may take several weeks to get the results. Back.  We scheduled the Upper GI Series test at [facility].  Go to patient registration inside the front door of the hospital.   Date is Monday 06-26-2013 at 10:00 am . Arrive at 9:45 am.   Have nothing to eat or drink after midnight.  We will call you with an appointment at [facility] once we call them to make the appointment.

## 2013-06-20 NOTE — Progress Notes (Signed)
Subjective:    Patient ID: Heather [name], female    DOB: 1946/02/13, 68 y.o.   MRN: [id number]  HPI Heather [name]  is a 68 year old African American female previously known to Dr. Verl Blalock who is referred today by her pulmonologist Dr. Marcina Millard for evaluation of a large hiatal hernia noted on CT of the chest. Patient has been undergoing evaluation for exertional dyspnea and there is question whether reflux and large hiatal hernia are playing a role. Patient had CT of the chest done in November of 2014 which showed a huge hiatal hernia containing the stomach and a loop of the transverse colon. She had also had a CT scan in June of 2014 with similar findings.  Colonoscopy had been done in March of 2007 which showed diverticulosis in the cecum to the sigmoid colon and a very redundant colon with a difficult exam. She also had EGD at that same time which did show a large paraesophageal hernia and evidence of gastric malrotation no stricture seen she did have a 7 mm polyp in the gastric body which was biopsied. This was a fundic gland polyp. She then underwent upper GI which showed a large hiatal hernia with the stomach the duodenal bulb been located in the chest there was rotation of the stomach with stomach be located in the right lower chest only a small paraesophageal component stomach and duodenal bulb noted to empty best when in the prone position. Patient denies any solid food dysphagia. She says she does of intermittent dysphagia with pills. She also says that she has accommodated with her eating over the years and generally eats very slowly and take small bites and has no difficulty eating. She does feel that she smaller portions than she used to however she has not had any weight loss. She is morbidly obese. There is no current complaints of chest pain nausea or vomiting. No Donald pain. She has noted recently that her stools have been a bit looser in some time she has more frequent bowel  movements. If she goes frequently she may see a small amount of blood from external hemorrhoids.  Patient states that her dyspnea previously had been exertional. She has been undergoing physical therapy and says that that has helped a lot and she really has not noticing any significant dyspnea at this time. She also tells me that she does not have any ongoing problems with heartburn or indigestion she uses Prilosec as needed but not on a daily basis.  Patient had one episode of chest pain several years ago which took her to the emergency room. She says that's when she learned of a large hiatal hernia. Surgery was suggested but she declined.     Review of Systems  Constitutional: Negative.   HENT: Positive for trouble swallowing.   Eyes: Negative.   Respiratory: Negative.   Cardiovascular: Negative.   Gastrointestinal: Negative.   Endocrine: Negative.   Genitourinary: Negative.   Musculoskeletal: Positive for arthralgias and back pain.  Allergic/Immunologic: Negative.   Neurological: Negative.   Hematological: Negative.   Psychiatric/Behavioral: Negative.    Outpatient Prescriptions Prior to Visit  Medication Sig Dispense Refill  . acetaminophen (TYLENOL) 500 MG tablet Take 500 mg by mouth 3 (three) times daily as needed for pain.      . Calcium Carbonate-Vitamin D (CALCIUM 600 + D PO) Take 2 tablets by mouth daily.        . clopidogrel (PLAVIX) 75 MG tablet TAKE 1 TABLET EVERY DAY  90 tablet  0  . Colesevelam HCl (WELCHOL) 3.75 G PACK Use 1 packet daily.  90 each  3  . CRESTOR 40 MG tablet TAKE 1 TABLET EVERY DAY  90 tablet  0  . Ferrous Sulfate (IRON) 325 (65 FE) MG TABS Take 1 tablet by mouth daily.        . fexofenadine-pseudoephedrine (ALLEGRA-D ALLERGY & CONGESTION) 180-240 MG per 24 hr tablet Take 1 tablet by mouth daily.  90 tablet  3  . furosemide (LASIX) 20 MG tablet TAKE 1 TABLET EVERY DAY  90 tablet  0  . glucosamine-chondroitin 500-400 MG tablet Take 1 tablet by mouth 2  (two) times daily.        [name] losartan (COZAAR) 25 MG tablet TAKE 1 TABLET EVERY DAY  90 tablet  0  . metFORMIN (GLUCOPHAGE-XR) 500 MG 24 hr tablet Take 2 tablets (1,000 mg total) by mouth 2 (two) times daily.  180 tablet  0  . Multiple Vitamin (MULTIVITAMIN) tablet Take 1 tablet by mouth daily.        . Nutritional Supplements (PEPTAMEN 1.5 PO) Take by mouth 2 (two) times daily.      [name] omeprazole (PRILOSEC) 20 MG capsule Take 20 mg by mouth daily as needed.       . potassium chloride SA (K-DUR,KLOR-CON) 20 MEQ tablet Take 1 tablet (20 mEq total) by mouth daily.  90 tablet  3  . propranolol (INDERAL) 20 MG tablet Take 1 tablet (20 mg total) by mouth 2 (two) times daily. For migraines  180 tablet  1  . SUMAtriptan (IMITREX) 20 MG/ACT nasal spray Place 1 spray (20 mg total) into the nose every 2 (two) hours as needed for migraine.  12 Inhaler  1  . topiramate (TOPAMAX) 100 MG tablet Take 1 tablet (100 mg total) by mouth 2 (two) times daily.  180 tablet  1  . vitamin E 200 UNIT capsule Take 200 Units by mouth daily.       No facility-administered medications prior to visit.   Allergies  Allergen Reactions  . Aspirin   . Lisinopril     REACTION: cough  . Penicillins     REACTION: rash  . Prednisone    Patient Active Problem List   Diagnosis Date Noted  . 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 Myalgia and myositis 05/02/2013  . Muscle ache 04/03/2013  . Nocturnal hypoxemia 02/17/2013  . Morbid obesity 01/24/2013  . Hiatal hernia seen on CT 08/24/2012  .  LLL nodule 73m - June 2014, described in 2003 07/13/2012  . Asthma, chronic 06/29/2012  . Arthralgia 06/02/2012  . Unspecified asthma 06/02/2012  . Hypersomnia with sleep apnea, unspecified 03/11/2012  . Other alteration of consciousness 03/11/2012  . Migraine without aura, without mention of intractable migraine without mention of status migrainosus 03/11/2012  . Encounter for long-term (current) use of other medications 12/28/2011  .  Dyspnea 12/28/2011  . Screening examination for infectious disease 12/28/2011  . Hx of fibroids 08/03/2011  . Knee pain, left 12/25/2010  . NECK PAIN 12/03/2009  . EDEMA 12/18/2008  . TINNITUS 11/22/2007  . HEARING LOSS 11/22/2007  . KNEE PAIN, RIGHT 11/22/2007  . ASYMPTOMATIC POSTMENOPAUSAL STATUS 11/22/2007  . GOITER, MULTINODULAR 07/20/2007  . GERD 07/20/2007  . OTHER CHRONIC NONALCOHOLIC LIVER DISEASE 048/18/5631 . HEADACHE 07/20/2007  . DIABETES MELLITUS, TYPE II 01/13/2007  . HYPERCHOLESTEROLEMIA 01/13/2007  . ANXIETY 10/22/2006  . HYPERTENSION 10/22/2006  . ALLERGIC RHINITIS 10/22/2006  . OSTEOARTHRITIS 10/22/2006  History  Substance Use Topics  . Smoking status: Former Smoker -- 2.00 packs/day for 20 years    Types: Cigarettes    Quit date: 03/03/1979  . Smokeless tobacco: Never Used  . Alcohol Use: Yes     Comment: occasional    family history includes Arthritis in her mother; Asthma in her mother; Bipolar disorder in her mother; Cancer in her maternal aunt, maternal grandfather, and mother; Dementia in her mother; Depression in her mother; Heart disease in her maternal grandmother; Hypertension in her father; Migraines in her father.     Objective:   Physical Exam well-developed obese African American female in no acute distress accompanied by her husband blood pressure 108/74 pulse 80 height 5 foot 5 weight 288 BMI 47.9. HEENT; nontraumatic normocephalic EOMI PERRLA sclera anicteric, Supple ;no JVD, Cardiovascular; regular rate and rhythm with S1-S2 she is a soft systolic murmur, Pulmonary; clear bilaterally, Abdomen; obese soft nontender nondistended bowel sounds are active there is no palpable mass or hepatosplenomegaly, Rectal ;exam not done, Extremities; no clubbing cyanosis or edema skin warm and dry, Psych; mood and affect appropriate        Assessment & Plan:  #19  68 year old female morbidly obese with a paraesophageal hernia and intrathoracic stomach. She  also has a loop of transverse colon in the hernia as of last CT done November 2014. Patient is relatively asymptomatic does she says over the years she has learned to eat slowly and eat smaller portions. Has vague complaints of dysphagia to pills but no solid food dysphagia. She denies any episodes of chest or abdominal pain. #2 dyspnea likely multifactorial and at least in part due to deconditioning she is improved with physical therapy no direct correlation to position eating etc. Certainly having an intrathoracic stomach may affect her lung capacity some but she has had this huge hernia for many years #3 positive family history of colon cancer. Patient is not absolutely certain but thinks that her mother had colon cancer. Last colonoscopy was done in 2007 was very difficult exam due to long redundant colon. Patient is also now anticoagulated with Plavix and has a loop of transverse colon in her chest within the large paraesophageal hernia increasing risk of complications with colonoscopy. #4 diabetes mellitus #5 hypertension #6 chronic antiplatelet therapy on Plavix #7 migraine headaches #8 sleep apnea  Plan; long discussion with patient and her husband. Patient is not inclined to pursue any surgery as she is minimally symptomatic at this time and has been for many years nevertheless she would like to discuss surgical options with the surgeon regarding repair of the paraesophageal hernia and we will schedule this We'll schedule for upper GI to reassess anatomy and position of stomach, and rule out stricture Given increased risk of complications with colonoscopy we will pursue ColoGuard stool DNA testing rather than colonoscopy at this time. She will follow up with Dr. Deatra Ina as needed

## 2013-06-20 NOTE — Telephone Encounter (Signed)
I called [facility] and advised Otila Kluver that she saw the referral to [facility] for the Paraesophageal hernia. Otila Kluver saw the referral and said Aviva Signs will call the patient with the appointment. I told her Amy preferred either Dr. Dalbert Batman or Dr. Hassell Done.

## 2013-06-21 ENCOUNTER — Ambulatory Visit (INDEPENDENT_AMBULATORY_CARE_PROVIDER_SITE_OTHER): Payer: Medicare Other | Admitting: Neurology

## 2013-06-21 ENCOUNTER — Encounter: Payer: Self-pay | Admitting: Neurology

## 2013-06-21 VITALS — BP 112/70 | HR 82 | Resp 18 | Ht 64.5 in | Wt 279.0 lb

## 2013-06-21 DIAGNOSIS — G4733 Obstructive sleep apnea (adult) (pediatric): Secondary | ICD-10-CM

## 2013-06-21 DIAGNOSIS — Z9989 Dependence on other enabling machines and devices: Principal | ICD-10-CM

## 2013-06-21 NOTE — Progress Notes (Signed)
Reviewed and agree with management. Robert D. Kaplan, M.D., FACG  

## 2013-06-21 NOTE — Progress Notes (Signed)
[facility]  Provider:  Larey Seat, M D  Referring Provider: [name], MD Primary Care Physician:  Heather Shin, MD  Chief Complaint  Patient presents with  . Follow-up    Room 11  . Sleep consult    HPI:  Heather [name] is a 68 y.o. female ,seen here as a revisit after her sleep study.     Heather [name], a  68 year old right-handed [place] American female , is a former patient of Dr. Elvia [name] and is followed now by Dr. Floyde [name] here at [facility]. The patient's pulmonologist, Dr. Chase [name], recommended a review of her sleep breathing disorder in light of a new  diagnosis of nocturnal hypoxemia. The patient desaturates according to her her husband and from recollection of ONO data  into the 60s and 70s at night ( as documented on a home based  pulse oximetry). Cardiologist evaluation  found hypoxemia . The patient started per cardiologist on Losartin and ASA. She was referred by Dr. Doy [name] in June 2011 for a sleep study based on chief complaints of snoring, witnessed apneas and excessive daytime sleepiness. At the time the patient endorsed  the Epworth sleepiness score at a very high count of 20/24 points and the Beck's inventory at 3 points, her  BMI was 49.9, and her neck conference measured 14.5 inches . She was diagnosed with OSA after her study revealed an AHI of 11.7 (which constitutes mild apnea). During REM sleep, her AHI increased to 68.2/hr.  The oxygen  desaturations associated with REM sleep in supine sleep position  were suspected to be a cause for the patient's excessive daytime sleepiness and the reported morning headaches. The patient had reported at that time that she would prefer to sleep up sitting in a chair or recliner.  I saw Heather [name] last she has been compliantly using her BiPAP machine. Underwent a split night polysomnography on 12-13-12 and had endorsed the Epworth sleepiness score at 21 points her  neck circumference was 14.5 inches BMI 48.4. The  patient had an AHI of 46.8 which constitutes severe apnea RDI was 48.7/ hr . The Co2 retention was not found (by a CMS criteria) but she was at a level of 49.4 torr, very close to the threshold of 50 Torr. [name] Respiratory events were hypopneas and not frank apneas. Oxygen lowest saturation was 75% but 110 minutes of desaturation during the baseline part of this but study. The patient was titrated to a BiPAP setting of 11/7 cm water under which she slept 120 minutes and reached finally REM sleep. The patient tolerated BiPAP much better than CPAP , an ESON  medium size nasal mask was used . She had prolonged hypoxemia persisted in spite of CPAP and BiPAP use at various times during the night -02  nadir of 78% during the in lab titration.   06-21-13  The last compliance data for a 30 day download were dated 04-07-13. The patient's compliance was 90%, average time of CPAP or BiPAP use was 5 hours and 43 minutes. The patient uses a BiPAP machine a so-called easy breath ,  inspiratory pressure support of 11 cm of water and expiratory pressure of 7 cm. The patient has an AHI of 0.9, she is compliant  By CMS criteria .  I was able to compared downloads from [facility] beginning November 2014 to our in office data. The patient is using BiPAP  machine and has an inspiratory pressure of  11 cm and expiratory pressure of 7 cm water.  She has less nocturia and less dry mouth in the morning.   Taking Lasix to late will cause her nocturia. She is in PT .   Review of Systems: Out of a complete 14 system review, the patient complains of only the following symptoms, and all other reviewed systems are negative. The patient has SOB, morbidly obese, has CHF and ankle edema.  Epworth is 14 from 20,  FSS 44 from 55   GDS at 2 points  ,     History   Social History  . Marital Status: Married    Spouse Name: [name]    Number of Children: 2  . Years of Education:  College   Occupational History  . Retired    Social History Main Topics  . Smoking status: Former Smoker -- 2.00 packs/day for 20 years    Types: Cigarettes    Quit date: 03/03/1979  . Smokeless tobacco: Never Used  . Alcohol Use: No  . Drug Use: No  . Sexual Activity: Yes    Birth Control/ Protection: Surgical   Other Topics Concern  . Not on file   Social History Narrative   Patient is married [name]).   Patient has two children.   Patient is retired.   Patient has a college education.   Patient is right-handed.   Patient lives at home with family.   Caffeine Use: 2 soda every other day    Family History  Problem Relation Age of Onset  . Cancer Mother     Breast Cancer, Colon Cancer  . Asthma Mother   . Depression Mother   . Bipolar disorder Mother   . Dementia Mother   . Arthritis Mother   . Hypertension Father   . Migraines Father   . Cancer Maternal Aunt   . Heart disease Maternal Grandmother   . Cancer Maternal Grandfather     Past Medical History  Diagnosis Date  . ALLERGIC RHINITIS 10/22/2006  . ANEMIA 12/18/2008  . ASYMPTOMATIC POSTMENOPAUSAL STATUS 11/22/2007  . DIABETES MELLITUS, TYPE II 01/13/2007  . GERD 07/20/2007  . GOITER, MULTINODULAR 07/20/2007  . Headache(784.0) 07/20/2007  . HEARING LOSS 11/22/2007  . HYPERCHOLESTEROLEMIA 01/13/2007  . HYPERTENSION 10/22/2006  . OSTEOARTHRITIS 10/22/2006  . Other chronic nonalcoholic liver disease 08/06/3708  . Hyperglycemia   . NASH (nonalcoholic steatohepatitis)   . Fibroid   . Hx of colposcopy with cervical biopsy   . Abnormal Pap smear   . Complication of anesthesia   . Dyslipidemia   . Obesity   . Obstructive sleep apnea   . Degenerative arthritis   . Nocturnal hypoxemia 02/17/2013    Past Surgical History  Procedure Laterality Date  . Esophagogastroduodenoscopy  12/08/2005  . Stress cardiolite  10/21/2005  . Electrocardiogram  10/15/2006  . Dexa  08/2005  . Wisdom tooth extraction    .  Breast surgery      Breast reduction  . Sweat gland removal    . Dilation and curettage of uterus    . Eye surgery      Current Outpatient Prescriptions  Medication Sig Dispense Refill  . acetaminophen (TYLENOL) 500 MG tablet Take 500 mg by mouth 3 (three) times daily as needed for pain.      . Calcium Carbonate-Vitamin D (CALCIUM 600 + D PO) Take 2 tablets by mouth daily.        . clopidogrel (PLAVIX) 75 MG tablet TAKE 1 TABLET EVERY DAY  90 tablet  0  . Colesevelam HCl (WELCHOL) 3.75 G PACK Use 1 packet daily.  90 each  3  . CRESTOR 40 MG tablet TAKE 1 TABLET EVERY DAY  90 tablet  0  . Ferrous Sulfate (IRON) 325 (65 FE) MG TABS Take 1 tablet by mouth daily.        . fexofenadine-pseudoephedrine (ALLEGRA-D ALLERGY & CONGESTION) 180-240 MG per 24 hr tablet Take 1 tablet by mouth daily.  90 tablet  3  . furosemide (LASIX) 20 MG tablet TAKE 1 TABLET EVERY DAY  90 tablet  0  . glucosamine-chondroitin 500-400 MG tablet Take 1 tablet by mouth 2 (two) times daily.        [name] losartan (COZAAR) 25 MG tablet TAKE 1 TABLET EVERY DAY  90 tablet  0  . metFORMIN (GLUCOPHAGE-XR) 500 MG 24 hr tablet Take 2 tablets (1,000 mg total) by mouth 2 (two) times daily.  180 tablet  0  . Multiple Vitamin (MULTIVITAMIN) tablet Take 1 tablet by mouth daily.        . Nutritional Supplements (PEPTAMEN 1.5 PO) Take by mouth 2 (two) times daily.      [name] omeprazole (PRILOSEC) 20 MG capsule Take 20 mg by mouth daily as needed.       . potassium chloride SA (K-DUR,KLOR-CON) 20 MEQ tablet Take 1 tablet (20 mEq total) by mouth daily.  90 tablet  3  . propranolol (INDERAL) 20 MG tablet Take 1 tablet (20 mg total) by mouth 2 (two) times daily. For migraines  180 tablet  1  . SUMAtriptan (IMITREX) 20 MG/ACT nasal spray Place 1 spray (20 mg total) into the nose every 2 (two) hours as needed for migraine.  12 Inhaler  1  . topiramate (TOPAMAX) 100 MG tablet Take 1 tablet (100 mg total) by mouth 2 (two) times daily.  180 tablet  1  .  vitamin E 200 UNIT capsule Take 200 Units by mouth daily.       No current facility-administered medications for this visit.    Allergies as of 06/21/2013 - Review Complete 06/21/2013  Allergen Reaction Noted  . Aspirin  08/04/2011  . Lisinopril  06/03/2006  . Penicillins  06/03/2006  . Prednisone  07/12/2012    Vitals: BP 112/70  Pulse 82  Resp 18  Ht 5' 4.5" (1.638 m)  Wt 279 lb (126.554 kg)  BMI 47.17 kg/m2 Last Weight:  Wt Readings from Last 1 Encounters:  06/21/13 279 lb (126.554 kg)   Last Height:   Ht Readings from Last 1 Encounters:  06/21/13 5' 4.5" (1.638 m)    Physical exam:  General: The patient is awake, alert and appears not in acute distress. The patient is well groomed. Head: Normocephalic, atraumatic. Neck is supple. Mallampati 3 , neck circumference: 15.5 , no nasal deviation but slightly nasal voice.  Cardiovascular:  Regular rate and rhythm , without  murmurs or carotid bruit, and without distended neck veins. Respiratory: Lungs are clear to auscultation. Skin:  With evidence of  Ankle edema 2 plus , no rash. Trunk: BMI is significantly  elevated . The  patient  has normal posture.  Neurologic exam : The patient is awake and alert, oriented to place and time.  Memory subjective  described as intact. There is a normal attention span & concentration ability. Speech is fluent without  dysarthria, dysphonia or aphasia. Mood and affect are appropriate.  Cranial nerves: No loss of taste and smell, Pupils are equal and briskly  reactive to light. Funduscopic exam without  evidence of pallor or edema. She is status post cataract ,  Extraocular movements  in vertical and horizontal planes intact and without nystagmus. Visual fields by finger perimetry are intact. Hearing to finger rub intact.  Facial sensation intact to fine touch. Facial motor strength is symmetric and tongue and uvula move midline.  Motor exam:   Reduced muscle tone, no rigor, and no  cog-wheeling.  The patient is morbidly obese - the muscle bulk is difficult to assess-  Evidence of symmetrically  reduced proximal muscle strength in all extremities. (Thigh- upper arm)   Coordination: Rapid alternating movements in the fingers/hands is tested and normal.  Finger-to-nose maneuver tested and normal without evidence of ataxia, dysmetria or tremor.  Gait and station: Patient walks without assistive device - her  Stance is wide based, Tandem gait is fragmented. She has trouble turning, has  trouble managing stairs , one foot at a time.   The patient walks deliberately slow and measured.  Romberg testing is positive , she sways to the right and forward, this propulsive trend is  not present when her eyes are open .   Reported no fallls in 24 month.   Deep tendon reflexes: in the upper and lower extremities are symmetrically attenuated.  Babinski maneuver response is  downgoing.   Assessment:  After physical and neurologic examination, review of sleep studies, and pre-existing records, assessment is that of a patient with morbid obesity , morning headaches and known OSA - 3 years ago at a mild degree.  Since than she gained weight and lost muscle tone. Her headaches have been treated by Dr Jannifer Franklin.  She has documented  Edema, SOB and hypoxia at night.   Plan:  Treatment plan and additional workup :  1) SPLIT study  Results discussed -  High AHI treated with BiPAP and well tolerated. O2 added is well tolerated.  Low  O2 and CO2 ,  Was borderline for CO2 retention and was severely and prolonged hypoxic.   She needs to take her lasix in early PM and not at night,  She  has been using an oxygen concentrator based on January 2015 ONO result  with 2 .21 hours of hypoxemia while on PAP, improvement in Epworth and FSS. Rv in 12 month sleep clinic with PAP machine.  [name]

## 2013-06-22 ENCOUNTER — Telehealth: Payer: Self-pay | Admitting: *Deleted

## 2013-06-22 DIAGNOSIS — M545 Low back pain, unspecified: Secondary | ICD-10-CM | POA: Diagnosis not present

## 2013-06-22 NOTE — Telephone Encounter (Signed)
Message copied by Hulan Saas on Thu Jun 22, 2013  3:52 PM ------      Message from: [place], [place] S      Created: Thu Jun 22, 2013  3:35 PM       Please call pt, and let her know surgeon is requesting she have several other tests done including [name], and manometry of esophagus prior to scheduling visit- Since she does not want to have surgery, I don't think we need to do all these tests-so would cancel the surgery referral unless she has changed her mind and wants to pursue ------

## 2013-06-22 NOTE — Telephone Encounter (Signed)
Left a message for patient to call me. 

## 2013-06-23 ENCOUNTER — Other Ambulatory Visit: Payer: Self-pay | Admitting: *Deleted

## 2013-06-23 ENCOUNTER — Encounter: Payer: Self-pay | Admitting: *Deleted

## 2013-06-23 DIAGNOSIS — G8929 Other chronic pain: Secondary | ICD-10-CM

## 2013-06-23 DIAGNOSIS — K449 Diaphragmatic hernia without obstruction or gangrene: Secondary | ICD-10-CM

## 2013-06-23 DIAGNOSIS — K219 Gastro-esophageal reflux disease without esophagitis: Secondary | ICD-10-CM

## 2013-06-23 DIAGNOSIS — R1013 Epigastric pain: Secondary | ICD-10-CM

## 2013-06-23 DIAGNOSIS — R109 Unspecified abdominal pain: Secondary | ICD-10-CM

## 2013-06-23 NOTE — Telephone Encounter (Signed)
Fine- can schedule upper abdominal US, and a esophageal manometry.  However again- I don't think necessary if she does not intend to have surgery.

## 2013-06-23 NOTE — Telephone Encounter (Signed)
Patient given appointment date, time and instructions.

## 2013-06-23 NOTE — Telephone Encounter (Signed)
Spoke with patient and she wants to go ahead and have the testing needed prior to seeing surgeon. Please, advise.

## 2013-06-23 NOTE — Telephone Encounter (Signed)
Scheduled [name] of abdomen at [facility] radiology(Tony) on 07/04/13 at 8:30 AM. NPO after midnight. Scheduled Esophageal manometry  [facility] endoscopy on 07/03/13 at 9:30 AM. Mailed instructions to patient.

## 2013-06-25 ENCOUNTER — Encounter: Payer: Self-pay | Admitting: Internal Medicine

## 2013-06-25 ENCOUNTER — Telehealth: Payer: Self-pay | Admitting: Internal Medicine

## 2013-06-25 DIAGNOSIS — R911 Solitary pulmonary nodule: Secondary | ICD-10-CM | POA: Insufficient documentation

## 2013-06-25 NOTE — Telephone Encounter (Signed)
Twilight she is on non specifc beta blocker propranalol for migraine. SHe tells me this really helps her migraine but I Was wondering if it was playing some role in dyspnea in case she has obstructive lung disease. Is there an alterntaive for her migraine that she could try for  A short while?  Thanks  Dr. Brand Males, M.D., [facility].C.P Pulmonary and Critical Care Medicine Staff Physician [facility] Pager: [phone], If no answer or between  15:00h - 7:00h: call [phone]  06/25/2013 10:06 AM

## 2013-06-25 NOTE — Assessment & Plan Note (Signed)
Refer GI

## 2013-06-25 NOTE — Assessment & Plan Note (Signed)
#  Shortness of breath  - Currently this is mild and is probably related to your weight, physical deconditioning and possibly the hiatal hernia and propranolol - Refer Gastroenterology for hiatal hernia referral - I will write to your neurologist and recommend you stop your propranolol - We will followup your status in a few months and depending on your response to the above we'll consider empiric inhaler therapy Possibly Spiriva or inhaled steroids   #Followup  - 3 months from now or sooner if needed

## 2013-06-25 NOTE — Assessment & Plan Note (Signed)
#  Lung nodule 7 mm left lower lobe - unchanged June 2014 through April 2015  - Do followup low-dose CT scan of the chest in April 2016; will be the 2nd year scan

## 2013-06-26 ENCOUNTER — Telehealth: Payer: Self-pay | Admitting: Internal Medicine

## 2013-06-26 ENCOUNTER — Ambulatory Visit (HOSPITAL_COMMUNITY)
Admission: RE | Admit: 2013-06-26 | Discharge: 2013-06-26 | Disposition: A | Discharge status: Home / Self Care | Payer: Medicare Other | Source: Ambulatory Visit | Attending: Physician Assistant | Admitting: Physician Assistant

## 2013-06-26 DIAGNOSIS — K449 Diaphragmatic hernia without obstruction or gangrene: Secondary | ICD-10-CM | POA: Insufficient documentation

## 2013-06-26 MED ORDER — METOPROLOL SUCCINATE ER 25 MG PO TB24: 25.0000 mg | ORAL_TABLET | Freq: Every day | ORAL | Status: DC

## 2013-06-26 NOTE — Telephone Encounter (Signed)
I went over all the details with the patient.  All questions answered.  She is asked to call back for any questions or concerns

## 2013-06-26 NOTE — Telephone Encounter (Signed)
Called patient and suggested a change to TOPROL 25 mg at night from propanolol. She will try this  for 30 days,  andasked to have the medication send to [facility] on [address].  She wants her mail-order not to be altered until she knows that is treating her migraine and helping her lung-  CD

## 2013-06-26 NOTE — Telephone Encounter (Signed)
Hi CArmen  Yes beta 1 specific beta blocker is fine too.   Thanks  Dr. Brand Males, M.D., [facility].C.P Pulmonary and Critical Care Medicine Staff Physician [facility] Pager: [phone], If no answer or between  15:00h - 7:00h: call [phone]  06/26/2013 11:49 AM

## 2013-06-26 NOTE — Telephone Encounter (Signed)
Dear Dr. Lynford Citizen ,  Yes she could use verapamil( albeit not as helpful  as beta blocker), Topiramate , Zonisamide,  Would you feel more comfortable with a more specific beta blocker , such as Toprol?   We use beta blocker for HA prevention only at night , not in daytime.   Heather [name] -

## 2013-06-27 ENCOUNTER — Telehealth: Payer: Self-pay

## 2013-06-27 DIAGNOSIS — M545 Low back pain, unspecified: Secondary | ICD-10-CM | POA: Diagnosis not present

## 2013-06-27 NOTE — Telephone Encounter (Signed)
Message copied by Marlon Pel on Tue Jun 27, 2013  2:35 PM ------      Message from: Gatha Mayer      Created: Tue Jun 27, 2013  2:27 PM      Regarding: FW: ? if tests needed       Please see below - we can cancel the mano, etc      ----- Message -----         From: Adin Hector, MD         Sent: 06/27/2013  12:15 PM           To: Gatha Mayer, MD      Subject: RE: ? if tests needed                                    Glendell Docker,      Agree manometry not obviously indicated so would hold off for now.      Thanks.      [place]      ----- Message -----         From: Gatha Mayer, MD         Sent: 06/27/2013   9:59 AM           To: Adin Hector, MD      Subject: ? if tests needed                                        Inis Sizer,            This lady is scheduled to see you and I noticed her on for a manometry under my name and was checking her chart. I am not her GI MD necessarily as she used to see dave Sharlett Iles and Alben Spittle co-signed Amy Esterwood's note.            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 Apparently she is not certain she wants surgery though I would expect only more problems down the road if she doesn't.            We can still test her but not sure if results will be useful.            I look forward to your thoughts.            Thanks, Glendell Docker             ------

## 2013-06-27 NOTE — Telephone Encounter (Signed)
I have cancelled the appt and I have left her a detailed message

## 2013-06-29 DIAGNOSIS — M545 Low back pain, unspecified: Secondary | ICD-10-CM | POA: Diagnosis not present

## 2013-07-03 ENCOUNTER — Ambulatory Visit (HOSPITAL_COMMUNITY): Admission: RE | Admit: 2013-07-03 | Payer: Medicare Other | Source: Ambulatory Visit | Admitting: Internal Medicine

## 2013-07-03 ENCOUNTER — Encounter (HOSPITAL_COMMUNITY): Admission: RE | Payer: Self-pay | Source: Ambulatory Visit

## 2013-07-03 SURGERY — MANOMETRY, ESOPHAGUS

## 2013-07-04 ENCOUNTER — Ambulatory Visit (HOSPITAL_COMMUNITY)
Admission: RE | Admit: 2013-07-04 | Discharge: 2013-07-04 | Disposition: A | Discharge status: Home / Self Care | Payer: Medicare Other | Source: Ambulatory Visit | Attending: Physician Assistant | Admitting: Physician Assistant

## 2013-07-04 DIAGNOSIS — K219 Gastro-esophageal reflux disease without esophagitis: Secondary | ICD-10-CM | POA: Diagnosis not present

## 2013-07-04 DIAGNOSIS — K449 Diaphragmatic hernia without obstruction or gangrene: Secondary | ICD-10-CM | POA: Insufficient documentation

## 2013-07-04 DIAGNOSIS — M545 Low back pain, unspecified: Secondary | ICD-10-CM | POA: Diagnosis not present

## 2013-07-04 DIAGNOSIS — R109 Unspecified abdominal pain: Secondary | ICD-10-CM | POA: Diagnosis not present

## 2013-07-05 ENCOUNTER — Encounter: Payer: Self-pay | Admitting: Gastroenterology

## 2013-07-07 DIAGNOSIS — M545 Low back pain, unspecified: Secondary | ICD-10-CM | POA: Diagnosis not present

## 2013-07-10 ENCOUNTER — Encounter (INDEPENDENT_AMBULATORY_CARE_PROVIDER_SITE_OTHER): Payer: Self-pay | Admitting: General Surgery

## 2013-07-10 ENCOUNTER — Ambulatory Visit (INDEPENDENT_AMBULATORY_CARE_PROVIDER_SITE_OTHER): Payer: Medicare Other | Admitting: General Surgery

## 2013-07-10 VITALS — BP 112/68 | HR 60 | Temp 98.0°F | Resp 16 | Ht 64.5 in | Wt 278.8 lb

## 2013-07-10 DIAGNOSIS — K449 Diaphragmatic hernia without obstruction or gangrene: Secondary | ICD-10-CM

## 2013-07-10 NOTE — Progress Notes (Addendum)
Patient ID: Heather [name], female   DOB: 20-Aug-1945, 68 y.o.   MRN: [id number]  Chief Complaint  Patient presents with  . Hiatal Hernia    new pt    Note: This dictation was prepared with Dragon/digital dictation along with Smartphrase technology. Any transcriptional errors that result from this process are unintentional.  HPI Heather [name] is a 68 y.o. female.  She is referred by Nicoletta Ba, PA at [facility] and Dr. Silvano Rusk for evaluation of a large type III hiatal hernia with esophageal shortening. Her PCP is Dr. [name]. Her pulmonologist is Dr. Chase Caller. Her cardiologist is Dr. Christen Butter.  She is minimally symptomatic from her hiatal hernia. She was told she had a hiatal hernia back in 2007 after an upper endoscopy She has been undergoing evaluation for exertional dyspnea and mild intermittent reflux. A CT scan of the chest was done in November of 2014 which showed a huge hiatal hernia containing stomach, a loop of the transverse colon, and esophageal shortening. Last colonoscopy 2007 showing diverticulosis. Last EGD in 2007 showed large paraesophageal hernia with malrotation but no stricture and gastric polyp which was benign. She's undergone upper GI which shows a large hiatal hernia with the stomach up in the chest with rotation, pylorus at the diaphragmatic plane , some of  the transverse colon herniated, and what appears to be a shortened esophagus. I agree that with that the GE junction does look to be well above the diaphragm. There is no obstruction.  Symptomatically, she is doing very well. She denies any dysphagia to food or liquids but has always had little bit of trouble swallowing pills. She rarely has some reflux, and when she does she takes a proton pump inhibitor for 3 days and then it goes away. No real chest pain or structures symptoms. No nausea or vomiting or abdominal pain. She remains morbidly obese. BMI 48.  Comorbidities include morbid obesity with BMI  48, type 2 diabetes, chronic anemia, multinodular goiter, hypertension, non-alcoholic's hepatic steatosis, obstructive sleep apnea on BiPAP and oxygen at night, on Plavix but not really sure why, migraine headaches.  She is not sure that she wants to have surgery but did want to have a consultation with a surgeon. Her husband is with her throughout being encounter.     Marland KitchenHPI  Past Medical History  Diagnosis Date  . ALLERGIC RHINITIS 10/22/2006  . ANEMIA 12/18/2008  . ASYMPTOMATIC POSTMENOPAUSAL STATUS 11/22/2007  . DIABETES MELLITUS, TYPE II 01/13/2007  . GERD 07/20/2007  . GOITER, MULTINODULAR 07/20/2007  . Headache(784.0) 07/20/2007  . HEARING LOSS 11/22/2007  . HYPERCHOLESTEROLEMIA 01/13/2007  . HYPERTENSION 10/22/2006  . OSTEOARTHRITIS 10/22/2006  . Other chronic nonalcoholic liver disease 06/08/8117  . Hyperglycemia   . NASH (nonalcoholic steatohepatitis)   . Fibroid   . Hx of colposcopy with cervical biopsy   . Abnormal Pap smear   . Complication of anesthesia   . Dyslipidemia   . Obesity   . Obstructive sleep apnea   . Degenerative arthritis   . Nocturnal hypoxemia 02/17/2013    Past Surgical History  Procedure Laterality Date  . Esophagogastroduodenoscopy  12/08/2005  . Stress cardiolite  10/21/2005  . Electrocardiogram  10/15/2006  . Dexa  08/2005  . Wisdom tooth extraction    . Breast surgery      Breast reduction  . Sweat gland removal    . Dilation and curettage of uterus    . Eye surgery      Family  History  Problem Relation Age of Onset  . Cancer Mother     Breast Cancer, Colon Cancer  . Asthma Mother   . Depression Mother   . Bipolar disorder Mother   . Dementia Mother   . Arthritis Mother   . Hypertension Father   . Migraines Father   . Cancer Maternal Aunt   . Heart disease Maternal Grandmother   . Cancer Maternal Grandfather     Social History History  Substance Use Topics  . Smoking status: Former Smoker -- 2.00 packs/day for 20 years     Types: Cigarettes    Quit date: 03/03/1979  . Smokeless tobacco: Never Used  . Alcohol Use: No    Allergies  Allergen Reactions  . Aspirin   . Coconut Oil   . Latex   . Lisinopril     REACTION: cough  . Peanut-Containing Drug Products   . Penicillins     REACTION: rash  . Prednisone   . Strawberry     Current Outpatient Prescriptions  Medication Sig Dispense Refill  . acetaminophen (TYLENOL) 500 MG tablet Take 500 mg by mouth 3 (three) times daily as needed for pain.      . Calcium Carbonate-Vitamin D (CALCIUM 600 + D PO) Take 2 tablets by mouth daily.        . clopidogrel (PLAVIX) 75 MG tablet TAKE 1 TABLET EVERY DAY  90 tablet  0  . Colesevelam HCl (WELCHOL) 3.75 G PACK Use 1 packet daily.  90 each  3  . CRESTOR 40 MG tablet TAKE 1 TABLET EVERY DAY  90 tablet  0  . Ferrous Sulfate (IRON) 325 (65 FE) MG TABS Take 1 tablet by mouth daily.        . fexofenadine-pseudoephedrine (ALLEGRA-D ALLERGY & CONGESTION) 180-240 MG per 24 hr tablet Take 1 tablet by mouth daily.  90 tablet  3  . furosemide (LASIX) 20 MG tablet TAKE 1 TABLET EVERY DAY  90 tablet  0  . glucosamine-chondroitin 500-400 MG tablet Take 1 tablet by mouth 2 (two) times daily.        [name] losartan (COZAAR) 25 MG tablet TAKE 1 TABLET EVERY DAY  90 tablet  0  . metFORMIN (GLUCOPHAGE-XR) 500 MG 24 hr tablet Take 2 tablets (1,000 mg total) by mouth 2 (two) times daily.  180 tablet  0  . metoprolol succinate (TOPROL XL) 25 MG 24 hr tablet Take 1 tablet (25 mg total) by mouth daily.  30 tablet  2  . Multiple Vitamin (MULTIVITAMIN) tablet Take 1 tablet by mouth daily.        . Nutritional Supplements (PEPTAMEN 1.5 PO) Take by mouth 2 (two) times daily.      [name] omeprazole (PRILOSEC) 20 MG capsule Take 20 mg by mouth daily as needed.       . potassium chloride SA (K-DUR,KLOR-CON) 20 MEQ tablet Take 1 tablet (20 mEq total) by mouth daily.  90 tablet  3  . propranolol (INDERAL) 20 MG tablet daily.      . SUMAtriptan (IMITREX) 20  MG/ACT nasal spray Place 1 spray (20 mg total) into the nose every 2 (two) hours as needed for migraine.  12 Inhaler  1  . topiramate (TOPAMAX) 100 MG tablet Take 1 tablet (100 mg total) by mouth 2 (two) times daily.  180 tablet  1  . vitamin E 200 UNIT capsule Take 200 Units by mouth daily.       No current facility-administered medications for  this visit.    Review of Systems Review of Systems  Constitutional: Negative for fever, chills and unexpected weight change.  HENT: Negative for congestion, hearing loss, sore throat, trouble swallowing and voice change.   Eyes: Negative for visual disturbance.  Respiratory: Positive for chest tightness and shortness of breath. Negative for cough and wheezing.   Cardiovascular: Negative for chest pain, palpitations and leg swelling.  Gastrointestinal: Negative for nausea, vomiting, abdominal pain, diarrhea, constipation, blood in stool, abdominal distention and anal bleeding.  Genitourinary: Negative for hematuria, vaginal bleeding and difficulty urinating.  Musculoskeletal: Positive for arthralgias and back pain.  Skin: Negative for rash and wound.  Neurological: Negative for seizures, syncope and headaches.  Hematological: Negative for adenopathy. Does not bruise/bleed easily.  Psychiatric/Behavioral: Negative for confusion.    Blood pressure 112/68, pulse 60, temperature 98 F (36.7 C), temperature source Oral, resp. rate 16, height 5' 4.5" (1.638 m), weight 278 lb 12.8 oz (126.463 kg).  Physical Exam Physical Exam  Constitutional: She is oriented to person, place, and time. She appears well-developed and well-nourished. No distress.  BMI 47.12  HENT:  Head: Normocephalic and atraumatic.  Nose: Nose normal.  Mouth/Throat: No oropharyngeal exudate.  Eyes: Conjunctivae and EOM are normal. Pupils are equal, round, and reactive to light. Left eye exhibits no discharge. No scleral icterus.  Neck: Neck supple. No JVD present. No tracheal  deviation present. No thyromegaly present.  Cardiovascular: Normal rate, regular rhythm and intact distal pulses.   Murmur heard. Very soft systolic murmur  Pulmonary/Chest: Effort normal and breath sounds normal. No respiratory distress. She has no wheezes. She has no rales. She exhibits no tenderness.  Abdominal: Soft. Bowel sounds are normal. She exhibits no distension and no mass. There is no tenderness. There is no rebound and no guarding.  Obese. Soft. Nontender. No scars. No masses. No hernias.  Musculoskeletal: She exhibits no edema and no tenderness.  Lymphadenopathy:    She has no cervical adenopathy.  Neurological: She is alert and oriented to person, place, and time. She exhibits normal muscle tone. Coordination normal.  Skin: Skin is warm. No rash noted. She is not diaphoretic. No erythema. No pallor.  Psychiatric: She has a normal mood and affect. Her behavior is normal. Judgment and thought content normal.    Data Reviewed Numerous imaging studies. GI consultation. [facility] neurolog[facility] consultation.  Assessment    Essentially asymptomatic, Giant type III hiatal hernia with probable esophageal shortening. Although it is possible the esophagus might be able to be mobilized below the diaphragm, if any surgery is contemplated then Collis gastroplasty might be necessary.   Morbid obesity  Obstructive sleep apnea  Hypertension  Type 2 diabetes  Chronic antiplatelet therapy with Plavix  Minimal GERD  High risk    Plan    I had a very, very long talk with the patient and her husband. I discussed the anatomy of her hernia. I discussed the natural history of this over time which may be slow progression from the symptoms to obstruction and nausea and vomiting, and possibly even to acute crisis with strangulation.  I told them that it was less likely that the hiatal hernia was causing any respiratory embarrassment from volume displacement.  I told them that any pulmonary  problems would more likely  arise from reflux, coughing and wheezing,  which does not seem to be the problem.  Also discussed the indications, details, techniques and numerous risks of laparoscopic reduction and repair of her hiatal hernia, Nissen fundoplication,  use of mesh. I drew pictures of possible esophageal lengthening procedure, a Collis gastroplasty.     She is aware of the risk of bleeding, infection, perforation, leak, sepsis, splenectomy, recurrence of the hernia, cardiac, pulmonary and thromboembolic problems.  Basically I told her that from a medical and surgical standpoint, it was appropriate to offer surgery to her electively even in the absence of any significant symptoms. I told her that her symptoms may slowly progress(likely), or that she might present with a strangulation crisis(less likely). I told them how  difficult it was to predict this. She knows that she is a high-risk patient because of her numerous comorbidities.  I gave her patient information booklets. . I discussed the procedure and its recovery. Clearly she and her husband are uncertain as to whether they want her to have the surgery, and I encouraged them to go home and think about this for a while.  If she does want to contemplate surgery, I think it would be wise to get an upper endoscopy and  colonoscopy preop. Would also be wise to get cardiac clearance. Also wise to get a gallbladder ultrasound. They will call us back when they know what they want to do.        Edsel Petrin. Dalbert Batman, M.D., [facility] General and Minimally invasive Surgery Breast and Colorectal Surgery Office:   [phone] Pager:   [phone]  07/10/2013, 5:58 PM

## 2013-07-10 NOTE — Patient Instructions (Signed)
We have spent a long time discussing your large hiatal hernia the we have discussed the complications of shortening of the esophagus and the fact that the transverse colon has gone up into the hernia a little bit.  We have discussed the indications and details of surgical repair of this. We have discussed the risks and complications of the surgery.  We have discussed the potential complications of the hiatal hernia if we do not do surgery.  You have been advised that it is appropriate to perform this type of surgery at any time, although there is no immediate emergency, especially since you do not have any significant symptoms.  We have discussed the fact that you are increased risk because of your other medical problems.  You have stated that hyouwould like to go home and think about this for a while and let us know.  If you decide that you want to consider having the surgery, the next step would be an upper endoscopy, colonoscopy, and gallbladder ultrasound.  You would also need to see your cardiologist for a cardiac clearance.  After all that was done, we would bring you back to the office to see my partner, Dr. Ralene Ok for repair of hiatal hernia and possible esophageal lengthening procedure.      Hiatal Hernia A hiatal hernia occurs when a part of the stomach slides above the diaphragm. The diaphragm is the thin muscle separating the belly (abdomen) from the chest. A hiatal hernia can be something you are born with or develop over time. Hiatal hernias may allow stomach acid to flow back into your esophagus, the tube which carries food from your mouth to your stomach. If this acid causes problems it is called GERD (gastro-esophageal reflux disease).  SYMPTOMS  Common symptoms of GERD are heartburn (burning in your chest). This is worse when lying down or bending over. It may also cause belching and indigestion. Some of the things which make GERD worse are:  Increased weight  pushes on stomach making acid rise more easily.  Smoking markedly increases acid production.  Alcohol decreases lower esophageal sphincter pressure (valve between stomach and esophagus), allowing acid from stomach into esophagus.  Late evening meals and going to bed with a full stomach increases pressure.  Anything that causes an increase in acid production.  Lower esophageal sphincter incompetence. DIAGNOSIS  Hiatal hernia is often diagnosed with x-rays of your stomach and small bowel. This is called an UGI (upper gastrointestinal x-ray). Sometimes a gastroscopic procedure is done. This is a procedure where your caregiver uses a flexible instrument to look into the stomach and small bowel. HOME CARE INSTRUCTIONS   Try to achieve and maintain an ideal body weight.  Avoid drinking alcoholic beverages.  Stop smoking.  Put the head of your bed on 4 to 6 inch blocks. This will keep your head and esophagus higher than your stomach. If you cannot use blocks, sleep with several pillows under your head and shoulders.  Over-the-counter medications will decrease acid production. Your caregiver can also prescribe medications for this. Take as directed.  1/2 to 1 teaspoon of an antacid taken every hour while awake, with meals and at bedtime, will neutralize acid.  Do not take aspirin, ibuprofen (Advil or Motrin), or other nonsteroidal anti-inflammatory drugs.  Do not wear tight clothing around your chest or stomach.  Eat smaller meals and eat more frequently. This keeps your stomach from getting too full. Eat slowly.  Do not lie down for 2 or  3 hours after eating. Do not eat or drink anything 1 to 2 hours before going to bed.  Avoid caffeine beverages (colas, coffee, cocoa, tea), fatty foods, citrus fruits and all other foods and drinks that contain acid and that seem to increase the problems.  Avoid bending over, especially after eating. Also avoid straining during bowel movements or when  urinating or lifting things. Anything that increases the pressure in your belly increases the amount of acid that may be pushed up into your esophagus. SEEK IMMEDIATE MEDICAL CARE IF:  There is change in location (pain in arms, neck, jaw, teeth or back) of your pain, or the pain is getting worse.  You also experience nausea, vomiting, sweating (diaphoresis), or shortness of breath.  You develop continual vomiting, vomit blood or coffee ground material, have bright red blood in your stools, or have black tarry stools. Some of these symptoms could signal other problems such as heart disease. MAKE SURE YOU:   Understand these instructions.  Monitor your condition.  Contact your caregiver if you are not doing well or are getting worse. Document Released: 05/09/2003 Document Revised: 05/11/2011 Document Reviewed: 02/16/2005 [facility] Patient Information 2014 [place], [place].

## 2013-07-11 DIAGNOSIS — M545 Low back pain, unspecified: Secondary | ICD-10-CM | POA: Diagnosis not present

## 2013-07-13 DIAGNOSIS — M545 Low back pain, unspecified: Secondary | ICD-10-CM | POA: Diagnosis not present

## 2013-07-20 DIAGNOSIS — M545 Low back pain, unspecified: Secondary | ICD-10-CM | POA: Diagnosis not present

## 2013-07-25 DIAGNOSIS — M5137 Other intervertebral disc degeneration, lumbosacral region: Secondary | ICD-10-CM | POA: Diagnosis not present

## 2013-07-25 DIAGNOSIS — IMO0002 Reserved for concepts with insufficient information to code with codable children: Secondary | ICD-10-CM | POA: Diagnosis not present

## 2013-07-25 DIAGNOSIS — M79609 Pain in unspecified limb: Secondary | ICD-10-CM | POA: Diagnosis not present

## 2013-07-25 DIAGNOSIS — R700 Elevated erythrocyte sedimentation rate: Secondary | ICD-10-CM | POA: Diagnosis not present

## 2013-07-26 DIAGNOSIS — M545 Low back pain, unspecified: Secondary | ICD-10-CM | POA: Diagnosis not present

## 2013-07-27 DIAGNOSIS — M79609 Pain in unspecified limb: Secondary | ICD-10-CM | POA: Diagnosis not present

## 2013-07-27 DIAGNOSIS — IMO0002 Reserved for concepts with insufficient information to code with codable children: Secondary | ICD-10-CM | POA: Diagnosis not present

## 2013-07-27 DIAGNOSIS — M5137 Other intervertebral disc degeneration, lumbosacral region: Secondary | ICD-10-CM | POA: Diagnosis not present

## 2013-07-31 ENCOUNTER — Encounter: Payer: Self-pay | Admitting: Nurse Practitioner

## 2013-07-31 DIAGNOSIS — M545 Low back pain, unspecified: Secondary | ICD-10-CM | POA: Diagnosis not present

## 2013-08-10 ENCOUNTER — Encounter: Payer: Self-pay | Admitting: Neurology

## 2013-09-04 ENCOUNTER — Encounter: Payer: Self-pay | Admitting: Endocrinology

## 2013-09-05 ENCOUNTER — Other Ambulatory Visit: Payer: Self-pay

## 2013-09-05 MED ORDER — LOSARTAN POTASSIUM 25 MG PO TABS: ORAL_TABLET | ORAL | Status: DC

## 2013-09-05 MED ORDER — ROSUVASTATIN CALCIUM 40 MG PO TABS: ORAL_TABLET | ORAL | Status: DC

## 2013-09-05 MED ORDER — METFORMIN HCL ER 500 MG PO TB24: 1000.0000 mg | ORAL_TABLET | Freq: Two times a day (BID) | ORAL | Status: DC

## 2013-09-05 MED ORDER — CLOPIDOGREL BISULFATE 75 MG PO TABS: ORAL_TABLET | ORAL | Status: DC

## 2013-09-05 MED ORDER — FUROSEMIDE 20 MG PO TABS
ORAL_TABLET | ORAL | Status: DC
Start: 2013-09-05 — End: 2014-01-12

## 2013-09-18 ENCOUNTER — Other Ambulatory Visit: Payer: Self-pay | Admitting: Neurology

## 2013-09-19 NOTE — Telephone Encounter (Signed)
Per note on 04/26

## 2013-09-20 DIAGNOSIS — Z1231 Encounter for screening mammogram for malignant neoplasm of breast: Secondary | ICD-10-CM | POA: Diagnosis not present

## 2013-09-20 DIAGNOSIS — Z803 Family history of malignant neoplasm of breast: Secondary | ICD-10-CM | POA: Diagnosis not present

## 2013-09-25 ENCOUNTER — Telehealth: Payer: Self-pay | Admitting: *Deleted

## 2013-09-25 ENCOUNTER — Encounter: Payer: Self-pay | Admitting: Nurse Practitioner

## 2013-09-25 NOTE — Telephone Encounter (Signed)
Spoke with patient about appointment on 10/04/13 appointment was r/s with Dr. Jannifer Franklin on 11/14/13 at 3:30 pm.

## 2013-10-02 ENCOUNTER — Ambulatory Visit: Payer: Medicare Other | Admitting: Nurse Practitioner

## 2013-10-04 ENCOUNTER — Ambulatory Visit: Payer: Medicare Other | Admitting: Nurse Practitioner

## 2013-10-25 ENCOUNTER — Ambulatory Visit: Payer: Medicare Other | Admitting: Podiatrist

## 2013-10-31 ENCOUNTER — Encounter: Payer: Self-pay | Admitting: Endocrinology

## 2013-10-31 ENCOUNTER — Ambulatory Visit (INDEPENDENT_AMBULATORY_CARE_PROVIDER_SITE_OTHER): Payer: Medicare Other | Admitting: Endocrinology

## 2013-10-31 VITALS — BP 118/78 | HR 101 | Temp 98.4°F | Ht 64.5 in | Wt 273.0 lb

## 2013-10-31 DIAGNOSIS — E119 Type 2 diabetes mellitus without complications: Secondary | ICD-10-CM

## 2013-10-31 DIAGNOSIS — I2510 Atherosclerotic heart disease of native coronary artery without angina pectoris: Secondary | ICD-10-CM | POA: Diagnosis not present

## 2013-10-31 LAB — HEMOGLOBIN A1C
HEMOGLOBIN A1C: 6.2 % — AB (ref <5.7)
Mean Plasma Glucose: 131 mg/dL — ABNORMAL HIGH (ref <117)

## 2013-10-31 MED ORDER — ROSUVASTATIN CALCIUM 40 MG PO TABS: ORAL_TABLET | ORAL | Status: DC

## 2013-10-31 MED ORDER — METFORMIN HCL ER 500 MG PO TB24: 1000.0000 mg | ORAL_TABLET | Freq: Two times a day (BID) | ORAL | Status: DC

## 2013-10-31 MED ORDER — FEXOFENADINE-PSEUDOEPHED ER 180-240 MG PO TB24: 1.0000 | ORAL_TABLET | Freq: Every day | ORAL | Status: DC

## 2013-10-31 NOTE — Progress Notes (Signed)
Subjective:    Patient ID: Heather [name], female    DOB: 1945-11-21, 68 y.o.   MRN: [id number]  HPI Pt returns for f/u of type 2 DM (dx'ed 7121--FX known complications; she is rx'ed 2 oral meds; she does not take actos, due to edema).  pt states she feels well in general, except for fatigue.  She does not take the welchol.  Pt asks why she is on the plavix.  She was incidentally noted to have moderate Ca++ in the coronaries, but no assoc chest pain.  She saw Dr Einar Gip, who rx'ed plavix, due to her asa allergy. Past Medical History  Diagnosis Date  . ALLERGIC RHINITIS 10/22/2006  . ANEMIA 12/18/2008  . ASYMPTOMATIC POSTMENOPAUSAL STATUS 11/22/2007  . DIABETES MELLITUS, TYPE II 01/13/2007  . GERD 07/20/2007  . GOITER, MULTINODULAR 07/20/2007  . Headache(784.0) 07/20/2007  . HEARING LOSS 11/22/2007  . HYPERCHOLESTEROLEMIA 01/13/2007  . HYPERTENSION 10/22/2006  . OSTEOARTHRITIS 10/22/2006  . Other chronic nonalcoholic liver disease 5/88/3254  . Hyperglycemia   . NASH (nonalcoholic steatohepatitis)   . Fibroid   . Hx of colposcopy with cervical biopsy   . Abnormal Pap smear   . Complication of anesthesia   . Dyslipidemia   . Obesity   . Obstructive sleep apnea   . Degenerative arthritis   . Nocturnal hypoxemia 02/17/2013    Past Surgical History  Procedure Laterality Date  . Esophagogastroduodenoscopy  12/08/2005  . Stress cardiolite  10/21/2005  . Electrocardiogram  10/15/2006  . Dexa  08/2005  . Wisdom tooth extraction    . Breast surgery      Breast reduction  . Sweat gland removal    . Dilation and curettage of uterus    . Eye surgery      History   Social History  . Marital Status: Married    Spouse Name: [name]    Number of Children: 2  . Years of Education: College   Occupational History  . Retired    Social History Main Topics  . Smoking status: Former Smoker -- 2.00 packs/day for 20 years    Types: Cigarettes    Quit date: 03/03/1979  . Smokeless tobacco:  Never Used  . Alcohol Use: No  . Drug Use: No  . Sexual Activity: Yes    Birth Control/ Protection: Surgical   Other Topics Concern  . Not on file   Social History Narrative   Patient is married [name]).   Patient has two children.   Patient is retired.   Patient has a college education.   Patient is right-handed.   Patient lives at home with family.   Caffeine Use: 2 soda every other day    Current Outpatient Prescriptions on File Prior to Visit  Medication Sig Dispense Refill  . acetaminophen (TYLENOL) 500 MG tablet Take 500 mg by mouth 3 (three) times daily as needed for pain.      . Calcium Carbonate-Vitamin D (CALCIUM 600 + D PO) Take 2 tablets by mouth daily.        . clopidogrel (PLAVIX) 75 MG tablet TAKE 1 TABLET EVERY DAY  90 tablet  1  . Ferrous Sulfate (IRON) 325 (65 FE) MG TABS Take 1 tablet by mouth daily.        . furosemide (LASIX) 20 MG tablet TAKE 1 TABLET EVERY DAY  90 tablet  1  . glucosamine-chondroitin 500-400 MG tablet Take 1 tablet by mouth 2 (two) times daily.        [name]  losartan (COZAAR) 25 MG tablet TAKE 1 TABLET EVERY DAY  90 tablet  1  . Multiple Vitamin (MULTIVITAMIN) tablet Take 1 tablet by mouth daily.        . Nutritional Supplements (PEPTAMEN 1.5 PO) Take by mouth 2 (two) times daily.      [name] omeprazole (PRILOSEC) 20 MG capsule Take 20 mg by mouth daily as needed.       . potassium chloride SA (K-DUR,KLOR-CON) 20 MEQ tablet Take 1 tablet (20 mEq total) by mouth daily.  90 tablet  3  . SUMAtriptan (IMITREX) 20 MG/ACT nasal spray Place 1 spray (20 mg total) into the nose every 2 (two) hours as needed for migraine.  12 Inhaler  1  . topiramate (TOPAMAX) 100 MG tablet Take 1 tablet (100 mg total) by mouth 2 (two) times daily.  180 tablet  1  . vitamin E 200 UNIT capsule Take 200 Units by mouth daily.      . Colesevelam HCl (WELCHOL) 3.75 G PACK Use 1 packet daily.  90 each  3  . metoprolol succinate (TOPROL-XL) 25 MG 24 hr tablet TAKE 1 TABLET BY MOUTH  DAILY  90 tablet  2  . propranolol (INDERAL) 20 MG tablet daily.       No current facility-administered medications on file prior to visit.    Allergies  Allergen Reactions  . Aspirin   . Coconut Oil   . Latex   . Lisinopril     REACTION: cough  . Peanut-Containing Drug Products   . Penicillins     REACTION: rash  . Prednisone   . Strawberry     Family History  Problem Relation Age of Onset  . Cancer Mother     Breast Cancer, Colon Cancer  . Asthma Mother   . Depression Mother   . Bipolar disorder Mother   . Dementia Mother   . Arthritis Mother   . Hypertension Father   . Migraines Father   . Cancer Maternal Aunt   . Heart disease Maternal Grandmother   . Cancer Maternal Grandfather     BP 118/78  Pulse 101  Temp(Src) 98.4 F (36.9 C) (Oral)  Ht 5' 4.5" (1.638 m)  Wt 273 lb (123.832 kg)  BMI 46.15 kg/m2  SpO2 95%   Review of Systems Denies weight change and chest pain.     Objective:   Physical Exam VITAL SIGNS:  See vs page GENERAL: no distress. Pulses: dorsalis pedis intact bilat.  Feet: no deformity. feet are of normal color and temp. 1+ bilat leg edema  Skin: no ulcer on the feet.  Neuro: sensation is intact to touch on the feet.  Lab Results  Component Value Date   HGBA1C 6.2* 10/31/2013   i have reviewed the following outside records: Dr Irven Shelling notes)    Assessment & Plan:  DM: well-controlled CAD: new to me: I Explained the reason for the plavix, and advised her to continue indefinitely.     Patient is advised the following: Patient Instructions  blood tests are being requested for you today.  We'll contact you with results. If it is high, please resume the welchol.  Please come back for a "medicare wellness" appointment in 3 months.

## 2013-10-31 NOTE — Patient Instructions (Addendum)
blood tests are being requested for you today.  We'll contact you with results. If it is high, please resume the welchol.  Please come back for a "medicare wellness" appointment in 3 months.

## 2013-11-08 ENCOUNTER — Ambulatory Visit (INDEPENDENT_AMBULATORY_CARE_PROVIDER_SITE_OTHER): Payer: Medicare Other | Admitting: Podiatrist

## 2013-11-08 ENCOUNTER — Encounter: Payer: Self-pay | Admitting: Podiatrist

## 2013-11-08 VITALS — BP 114/66 | HR 83 | Resp 16

## 2013-11-08 DIAGNOSIS — E119 Type 2 diabetes mellitus without complications: Secondary | ICD-10-CM

## 2013-11-08 DIAGNOSIS — L84 Corns and callosities: Secondary | ICD-10-CM | POA: Diagnosis not present

## 2013-11-08 DIAGNOSIS — M216X9 Other acquired deformities of unspecified foot: Secondary | ICD-10-CM | POA: Diagnosis not present

## 2013-11-08 DIAGNOSIS — I2510 Atherosclerotic heart disease of native coronary artery without angina pectoris: Secondary | ICD-10-CM

## 2013-11-08 DIAGNOSIS — M2010 Hallux valgus (acquired), unspecified foot: Secondary | ICD-10-CM | POA: Diagnosis not present

## 2013-11-08 DIAGNOSIS — M2011 Hallux valgus (acquired), right foot: Secondary | ICD-10-CM

## 2013-11-08 NOTE — Patient Instructions (Signed)
Diabetes and Foot Care Diabetes may cause you to have problems because of poor blood supply (circulation) to your feet and legs. This may cause the skin on your feet to become thinner, break easier, and heal more slowly. Your skin may become dry, and the skin may peel and crack. You may also have nerve damage in your legs and feet causing decreased feeling in them. You may not notice minor injuries to your feet that could lead to infections or more serious problems. Taking care of your feet is one of the most important things you can do for yourself.  HOME CARE INSTRUCTIONS  Wear shoes at all times, even in the house. Do not go barefoot. Bare feet are easily injured.  Check your feet daily for blisters, cuts, and redness. If you cannot see the bottom of your feet, use a mirror or ask someone for help.  Wash your feet with warm water (do not use hot water) and mild soap. Then pat your feet and the areas between your toes until they are completely dry. Do not soak your feet as this can dry your skin.  Apply a moisturizing lotion or petroleum jelly (that does not contain alcohol and is unscented) to the skin on your feet and to dry, brittle toenails. Do not apply lotion between your toes.  Trim your toenails straight across. Do not dig under them or around the cuticle. File the edges of your nails with an emery board or nail file.  Do not cut corns or calluses or try to remove them with medicine.  Wear clean socks or stockings every day. Make sure they are not too tight. Do not wear knee-high stockings since they may decrease blood flow to your legs.  Wear shoes that fit properly and have enough cushioning. To break in new shoes, wear them for just a few hours a day. This prevents you from injuring your feet. Always look in your shoes before you put them on to be sure there are no objects inside.  Do not cross your legs. This may decrease the blood flow to your feet.  If you find a minor scrape,  cut, or break in the skin on your feet, keep it and the skin around it clean and dry. These areas may be cleansed with mild soap and water. Do not cleanse the area with peroxide, alcohol, or iodine.  When you remove an adhesive bandage, be sure not to damage the skin around it.  If you have a wound, look at it several times a day to make sure it is healing.  Do not use heating pads or hot water bottles. They may burn your skin. If you have lost feeling in your feet or legs, you may not know it is happening until it is too late.  Make sure your health care provider performs a complete foot exam at least annually or more often if you have foot problems. Report any cuts, sores, or bruises to your health care provider immediately. SEEK MEDICAL CARE IF:   You have an injury that is not healing.  You have cuts or breaks in the skin.  You have an ingrown nail.  You notice redness on your legs or feet.  You feel burning or tingling in your legs or feet.  You have pain or cramps in your legs and feet.  Your legs or feet are numb.  Your feet always feel cold. SEEK IMMEDIATE MEDICAL CARE IF:   There is increasing redness,   swelling, or pain in or around a wound.  There is a red line that goes up your leg.  Pus is coming from a wound.  You develop a fever or as directed by your health care provider.  You notice a bad smell coming from an ulcer or wound. Document Released: 02/14/2000 Document Revised: 10/19/2012 Document Reviewed: 07/26/2012 ExitCare Patient Information 2015 [employer]. This information is not intended to replace advice given to you by your health care provider. Make sure you discuss any questions you have with your health care provider.  

## 2013-11-08 NOTE — Progress Notes (Signed)
   Subjective:    Patient ID: Heather [name], female    DOB: 11/13/1945, 68 y.o.   MRN: [id number]  HPI Comments: "I need my feet checked"  Patient states that she got a note in My Chart that she needed a foot exam by a podiatrist. She doesn't have any pain or concerns. She would like to get her toenails cut and calluses trimmed"  Diabetes      Review of Systems  All other systems reviewed and are negative.      Objective:   Physical Exam  GENERAL APPEARANCE: Alert, conversant. Appropriately groomed. No acute distress.  VASCULAR: Pedal pulses palpable at 2/4 DP and PT bilateral.  Capillary refill time is immediate to all digits,  Proximal to distal cooling it warm to warm.  Digital hair growth is present bilateral  NEUROLOGIC: sensation is intact epicritically and protectively to 5.07 monofilament at 5/5 sites bilateral.  Light touch is intact bilateral, vibratory sensation intact bilateral, achilles tendon reflex is intact bilateral.  MUSCULOSKELETAL:bunion deformity present right foot.  Hammertoe formation right with crowding of digits. Prominent plantar flexed metatarsals 1,5 present bilateral.  otherwise acceptable muscle strength, tone and stability bilateral.  Intrinsic muscluature intact bilateral.  DERMATOLOGIC: large hyperkeratotic lesions present submet 1,5 bilateral feet.  Right hallux nail is lifted from the skin. Remainder if nails are dystrophic and mycotic.  Remainder of skin color, texture, and turger are within normal limits.   no interdigital maceration noted.  No open lesions present.       Assessment & Plan:  Prominent metatarsals with hyperkeratotic/porokeratotic lesions x 2 bilateral feet:  Symptomatic mycotic nails bilateral  Plan: Debridement of nails and calluses was accomplished at today's visit. May be candidate for diabetic shoes in the future to prevent ulceration or breakdown. She'll be seen back for recheck.

## 2013-11-14 ENCOUNTER — Encounter: Payer: Self-pay | Admitting: Neurology

## 2013-11-14 ENCOUNTER — Ambulatory Visit (INDEPENDENT_AMBULATORY_CARE_PROVIDER_SITE_OTHER): Payer: Medicare Other | Admitting: Neurology

## 2013-11-14 ENCOUNTER — Telehealth: Payer: Self-pay | Admitting: Neurology

## 2013-11-14 VITALS — BP 132/75 | HR 78 | Wt 271.0 lb

## 2013-11-14 DIAGNOSIS — I2510 Atherosclerotic heart disease of native coronary artery without angina pectoris: Secondary | ICD-10-CM | POA: Diagnosis not present

## 2013-11-14 DIAGNOSIS — R51 Headache: Secondary | ICD-10-CM | POA: Diagnosis not present

## 2013-11-14 DIAGNOSIS — G43009 Migraine without aura, not intractable, without status migrainosus: Secondary | ICD-10-CM

## 2013-11-14 MED ORDER — TOPIRAMATE 100 MG PO TABS: 100.0000 mg | ORAL_TABLET | Freq: Two times a day (BID) | ORAL | Status: DC

## 2013-11-14 MED ORDER — METOPROLOL SUCCINATE ER 25 MG PO TB24: 25.0000 mg | ORAL_TABLET | Freq: Every day | ORAL | Status: DC

## 2013-11-14 NOTE — Patient Instructions (Signed)

## 2013-11-14 NOTE — Progress Notes (Signed)
Reason for visit: Headache   Heather [name] is an 68 y.o. female  History of present illness:  Heather [name] is a 68 year old right-handed black female with a history of migraine headache. The patient has done quite well on the combination of metoprolol and Topamax. The patient indicates that she essentially is not having headaches, she has not used her Imitrex in quite some time. The patient did have some itching around the time she started the metoprolol, and she stopped the medication, thinking that she may have an allergy. She has since gone back on the medication, but she is tolerating the medication well at this time. The patient reports no other new medical issues that have come up since last seen.  Past Medical History  Diagnosis Date  . ALLERGIC RHINITIS 10/22/2006  . ANEMIA 12/18/2008  . ASYMPTOMATIC POSTMENOPAUSAL STATUS 11/22/2007  . DIABETES MELLITUS, TYPE II 01/13/2007  . GERD 07/20/2007  . GOITER, MULTINODULAR 07/20/2007  . Headache(784.0) 07/20/2007  . HEARING LOSS 11/22/2007  . HYPERCHOLESTEROLEMIA 01/13/2007  . HYPERTENSION 10/22/2006  . OSTEOARTHRITIS 10/22/2006  . Other chronic nonalcoholic liver disease 0/45/4098  . Hyperglycemia   . NASH (nonalcoholic steatohepatitis)   . Fibroid   . Hx of colposcopy with cervical biopsy   . Abnormal Pap smear   . Complication of anesthesia   . Dyslipidemia   . Obesity   . Obstructive sleep apnea   . Degenerative arthritis   . Nocturnal hypoxemia 02/17/2013    Past Surgical History  Procedure Laterality Date  . Esophagogastroduodenoscopy  12/08/2005  . Stress cardiolite  10/21/2005  . Electrocardiogram  10/15/2006  . Dexa  08/2005  . Wisdom tooth extraction    . Breast surgery      Breast reduction  . Sweat gland removal    . Dilation and curettage of uterus    . Eye surgery      Family History  Problem Relation Age of Onset  . Cancer Mother     Breast Cancer, Colon Cancer  . Asthma Mother   . Depression Mother    . Bipolar disorder Mother   . Dementia Mother   . Arthritis Mother   . Hypertension Father   . Migraines Father   . Cancer Maternal Aunt   . Heart disease Maternal Grandmother   . Cancer Maternal Grandfather     Social history:  reports that she quit smoking about 34 years ago. Her smoking use included Cigarettes. She has a 40 pack-year smoking history. She has never used smokeless tobacco. She reports that she does not drink alcohol or use illicit drugs.    Allergies  Allergen Reactions  . Aspirin   . Coconut Oil   . Latex   . Lisinopril     REACTION: cough  . Peanut-Containing Drug Products   . Penicillins     REACTION: rash  . Prednisone   . Strawberry     Medications:  Current Outpatient Prescriptions on File Prior to Visit  Medication Sig Dispense Refill  . acetaminophen (TYLENOL) 500 MG tablet Take 500 mg by mouth 3 (three) times daily as needed for pain.      . Calcium Carbonate-Vitamin D (CALCIUM 600 + D PO) Take 2 tablets by mouth daily.        . clopidogrel (PLAVIX) 75 MG tablet TAKE 1 TABLET EVERY DAY  90 tablet  1  . Ferrous Sulfate (IRON) 325 (65 FE) MG TABS Take 1 tablet by mouth daily.        [name]  fexofenadine-pseudoephedrine (ALLEGRA-D ALLERGY & CONGESTION) 180-240 MG per 24 hr tablet Take 1 tablet by mouth daily.  30 tablet  11  . furosemide (LASIX) 20 MG tablet TAKE 1 TABLET EVERY DAY  90 tablet  1  . glucosamine-chondroitin 500-400 MG tablet Take 1 tablet by mouth 2 (two) times daily.        [name] losartan (COZAAR) 25 MG tablet TAKE 1 TABLET EVERY DAY  90 tablet  1  . metFORMIN (GLUCOPHAGE-XR) 500 MG 24 hr tablet Take 2 tablets (1,000 mg total) by mouth 2 (two) times daily.  360 tablet  3  . Multiple Vitamin (MULTIVITAMIN) tablet Take 1 tablet by mouth daily.        . Nutritional Supplements (PEPTAMEN 1.5 PO) Take by mouth 2 (two) times daily.      [name] omeprazole (PRILOSEC) 20 MG capsule Take 20 mg by mouth daily as needed.       . potassium chloride SA  (K-DUR,KLOR-CON) 20 MEQ tablet Take 1 tablet (20 mEq total) by mouth daily.  90 tablet  3  . rosuvastatin (CRESTOR) 40 MG tablet TAKE 1 TABLET EVERY DAY  90 tablet  3  . SUMAtriptan (IMITREX) 20 MG/ACT nasal spray Place 1 spray (20 mg total) into the nose every 2 (two) hours as needed for migraine.  12 Inhaler  1  . vitamin E 200 UNIT capsule Take 200 Units by mouth daily.       No current facility-administered medications on file prior to visit.    ROS:  Out of a complete 14 system review of symptoms, the patient complains only of the following symptoms, and all other reviewed systems are negative.  Appetite change Joint pain  Blood pressure 132/75, pulse 78, weight 271 lb (122.925 kg).  Physical Exam  General: The patient is alert and cooperative at the time of the examination. The patient is moderately to markedly obese.  Skin: No significant peripheral edema is noted.   Neurologic Exam  Mental status: The patient is oriented x 3.  Cranial nerves: Facial symmetry is present. Speech is normal, no aphasia or dysarthria is noted. Extraocular movements are full. Visual fields are full.  Motor: The patient has good strength in all 4 extremities.  Sensory examination: Soft touch sensation is symmetric on the face, arms, and legs.  Coordination: The patient has good finger-nose-finger and heel-to-shin bilaterally.  Gait and station: The patient has a normal gait. Tandem gait is slightly unsteady. Romberg is negative. No drift is seen.  Reflexes: Deep tendon reflexes are symmetric.   Assessment/Plan:  One. Migraine headache  The patient doing fairly well at this time. She will continue the metoprolol for now, we will give her a refill on the Topamax and metoprolol, and she will followup in about 9 months.  Jill Alexanders MD 11/14/2013 4:20 PM  [facility] [address] [place], [place] [address]  Phone [phone] Fax [phone]

## 2013-11-14 NOTE — Telephone Encounter (Signed)
Created by mistake, error

## 2013-11-20 ENCOUNTER — Telehealth: Payer: Self-pay | Admitting: Neurology

## 2013-11-20 NOTE — Telephone Encounter (Signed)
Per patient message, patient states Medicare guidelines require that she be seen by her doctor between 12/16/13 and 03/16/14, without the appointment Medicare will no longer pay for her oxygen. Please call and advise.

## 2013-11-20 NOTE — Telephone Encounter (Signed)
Patient just had a re-visit with Dr. Brett Fairy on 06/21/13 but states because of Medicare guidelines she has to have an appointment between 12/16/13 and 03/16/2014.  Please advise.

## 2013-11-21 NOTE — Telephone Encounter (Signed)
Please make an appointment in October within this window. CD

## 2013-11-22 NOTE — Telephone Encounter (Signed)
Patient was called and an appointment was scheduled for February 01, 2014 at 1:00 pm.

## 2013-12-19 DIAGNOSIS — E119 Type 2 diabetes mellitus without complications: Secondary | ICD-10-CM | POA: Diagnosis not present

## 2013-12-19 DIAGNOSIS — Z961 Presence of intraocular lens: Secondary | ICD-10-CM | POA: Diagnosis not present

## 2013-12-19 DIAGNOSIS — H3589 Other specified retinal disorders: Secondary | ICD-10-CM | POA: Diagnosis not present

## 2013-12-19 LAB — HM DIABETES EYE EXAM

## 2014-01-01 ENCOUNTER — Encounter: Payer: Self-pay | Admitting: Neurology

## 2014-01-10 ENCOUNTER — Encounter: Payer: Self-pay | Admitting: Neurology

## 2014-01-10 ENCOUNTER — Encounter: Payer: Self-pay | Admitting: Endocrinology

## 2014-01-10 ENCOUNTER — Other Ambulatory Visit: Payer: Self-pay

## 2014-01-10 DIAGNOSIS — G4733 Obstructive sleep apnea (adult) (pediatric): Secondary | ICD-10-CM

## 2014-01-10 DIAGNOSIS — Z9989 Dependence on other enabling machines and devices: Principal | ICD-10-CM

## 2014-01-10 MED ORDER — FEXOFENADINE-PSEUDOEPHED ER 180-240 MG PO TB24: 1.0000 | ORAL_TABLET | Freq: Every day | ORAL | Status: DC

## 2014-01-10 NOTE — Telephone Encounter (Signed)
Needs portable  oxygen  For a cruise.

## 2014-01-12 ENCOUNTER — Encounter: Payer: Self-pay | Admitting: Endocrinology

## 2014-01-12 ENCOUNTER — Other Ambulatory Visit: Payer: Self-pay

## 2014-01-12 MED ORDER — CLOPIDOGREL BISULFATE 75 MG PO TABS: ORAL_TABLET | ORAL | Status: DC

## 2014-01-12 MED ORDER — LOSARTAN POTASSIUM 25 MG PO TABS: ORAL_TABLET | ORAL | Status: DC

## 2014-01-12 MED ORDER — FUROSEMIDE 20 MG PO TABS: ORAL_TABLET | ORAL | Status: DC

## 2014-01-19 DIAGNOSIS — Z13820 Encounter for screening for osteoporosis: Secondary | ICD-10-CM | POA: Diagnosis not present

## 2014-01-30 ENCOUNTER — Encounter: Payer: Self-pay | Admitting: Endocrinology

## 2014-01-30 ENCOUNTER — Ambulatory Visit (INDEPENDENT_AMBULATORY_CARE_PROVIDER_SITE_OTHER): Payer: Medicare Other | Admitting: Endocrinology

## 2014-01-30 VITALS — BP 128/88 | HR 85 | Temp 98.3°F | Ht 64.5 in | Wt 279.0 lb

## 2014-01-30 DIAGNOSIS — E7800 Pure hypercholesterolemia, unspecified: Secondary | ICD-10-CM

## 2014-01-30 DIAGNOSIS — Z0000 Encounter for general adult medical examination without abnormal findings: Secondary | ICD-10-CM | POA: Diagnosis not present

## 2014-01-30 DIAGNOSIS — K7581 Nonalcoholic steatohepatitis (NASH): Secondary | ICD-10-CM | POA: Diagnosis not present

## 2014-01-30 DIAGNOSIS — Z23 Encounter for immunization: Secondary | ICD-10-CM

## 2014-01-30 DIAGNOSIS — I2510 Atherosclerotic heart disease of native coronary artery without angina pectoris: Secondary | ICD-10-CM

## 2014-01-30 DIAGNOSIS — E042 Nontoxic multinodular goiter: Secondary | ICD-10-CM

## 2014-01-30 DIAGNOSIS — E119 Type 2 diabetes mellitus without complications: Secondary | ICD-10-CM

## 2014-01-30 DIAGNOSIS — I10 Essential (primary) hypertension: Secondary | ICD-10-CM

## 2014-01-30 LAB — CBC WITH DIFFERENTIAL/PLATELET
BASOS PCT: 0.4 % (ref 0.0–3.0)
Basophils Absolute: 0 10*3/uL (ref 0.0–0.1)
EOS ABS: 0.1 10*3/uL (ref 0.0–0.7)
Eosinophils Relative: 2 % (ref 0.0–5.0)
HCT: 36.3 % (ref 36.0–46.0)
Hemoglobin: 11.6 g/dL — ABNORMAL LOW (ref 12.0–15.0)
LYMPHS PCT: 24.2 % (ref 12.0–46.0)
Lymphs Abs: 1.3 10*3/uL (ref 0.7–4.0)
MCHC: 31.8 g/dL (ref 30.0–36.0)
MCV: 86.5 fl (ref 78.0–100.0)
Monocytes Absolute: 0.3 10*3/uL (ref 0.1–1.0)
Monocytes Relative: 5.9 % (ref 3.0–12.0)
NEUTROS PCT: 67.5 % (ref 43.0–77.0)
Neutro Abs: 3.7 10*3/uL (ref 1.4–7.7)
PLATELETS: 254 10*3/uL (ref 150.0–400.0)
RBC: 4.2 Mil/uL (ref 3.87–5.11)
RDW: 18.4 % — ABNORMAL HIGH (ref 11.5–15.5)
WBC: 5.4 10*3/uL (ref 4.0–10.5)

## 2014-01-30 LAB — URINALYSIS, ROUTINE W REFLEX MICROSCOPIC
Bilirubin Urine: NEGATIVE
HGB URINE DIPSTICK: NEGATIVE
Ketones, ur: NEGATIVE
Leukocytes, UA: NEGATIVE
NITRITE: NEGATIVE
SPECIFIC GRAVITY, URINE: 1.02 (ref 1.000–1.030)
Total Protein, Urine: NEGATIVE
URINE GLUCOSE: NEGATIVE
Urobilinogen, UA: 0.2 (ref 0.0–1.0)
pH: 8 (ref 5.0–8.0)

## 2014-01-30 LAB — HEMOGLOBIN A1C: HEMOGLOBIN A1C: 6.8 % — AB (ref 4.6–6.5)

## 2014-01-30 LAB — MICROALBUMIN / CREATININE URINE RATIO
CREATININE, U: 138.6 mg/dL
MICROALB UR: 1.3 mg/dL (ref 0.0–1.9)
MICROALB/CREAT RATIO: 0.9 mg/g (ref 0.0–30.0)

## 2014-01-30 LAB — TSH: TSH: 2 u[IU]/mL (ref 0.35–4.50)

## 2014-01-30 NOTE — Progress Notes (Signed)
Subjective:    Patient ID: Heather [name], female    DOB: September 08, 1945, 68 y.o.   MRN: [id number]  HPI  The state of at least three ongoing medical problems is addressed today, with interval history of each noted here: Pt returns for f/u of diabetes mellitus: DM type: 2 Dx'ed: 4496 Complications: none Therapy: metformin.  GDM: never DKA: never Severe hypoglycemia: never Pancreatitis: never Other: she does not take actos, due to edema; pt says she was told by surgeon she was not a good candidate for weight loss surgery, due to abd hernia. Interval history: Pt returns for f/u of type 2 DM (dx'ed 7591--MB known complications; she is rx'ed 2 oral meds; ).  pt states she feels well in general, except for fatigue.  She does not take the welchol.  Dyslipidemia: she denies chest pain.   HTN: she has slight doe.   Past Medical History  Diagnosis Date  . ALLERGIC RHINITIS 10/22/2006  . ANEMIA 12/18/2008  . ASYMPTOMATIC POSTMENOPAUSAL STATUS 11/22/2007  . DIABETES MELLITUS, TYPE II 01/13/2007  . GERD 07/20/2007  . GOITER, MULTINODULAR 07/20/2007  . Headache(784.0) 07/20/2007  . HEARING LOSS 11/22/2007  . HYPERCHOLESTEROLEMIA 01/13/2007  . HYPERTENSION 10/22/2006  . OSTEOARTHRITIS 10/22/2006  . Other chronic nonalcoholic liver disease 8/46/6599  . Hyperglycemia   . NASH (nonalcoholic steatohepatitis)   . Fibroid   . Hx of colposcopy with cervical biopsy   . Abnormal Pap smear   . Complication of anesthesia   . Dyslipidemia   . Obesity   . Obstructive sleep apnea   . Degenerative arthritis   . Nocturnal hypoxemia 02/17/2013    Past Surgical History  Procedure Laterality Date  . Esophagogastroduodenoscopy  12/08/2005  . Stress cardiolite  10/21/2005  . Electrocardiogram  10/15/2006  . Dexa  08/2005  . Wisdom tooth extraction    . Breast surgery      Breast reduction  . Sweat gland removal    . Dilation and curettage of uterus    . Eye surgery      History   Social History  .  Marital Status: Married    Spouse Name: [name]    Number of Children: 2  . Years of Education: College   Occupational History  . Retired    Social History Main Topics  . Smoking status: Former Smoker -- 2.00 packs/day for 20 years    Types: Cigarettes    Quit date: 03/03/1979  . Smokeless tobacco: Never Used  . Alcohol Use: No  . Drug Use: No  . Sexual Activity: Yes    Birth Control/ Protection: Surgical   Other Topics Concern  . Not on file   Social History Narrative   Patient is married [name]).   Patient has two children.   Patient is retired.   Patient has a college education.   Patient is right-handed.   Patient lives at home with family.   Caffeine Use: 2 soda every other day    Current Outpatient Prescriptions on File Prior to Visit  Medication Sig Dispense Refill  . acetaminophen (TYLENOL) 500 MG tablet Take 500 mg by mouth 3 (three) times daily as needed for pain.    . Calcium Carbonate-Vitamin D (CALCIUM 600 + D PO) Take 2 tablets by mouth daily.      . clopidogrel (PLAVIX) 75 MG tablet TAKE 1 TABLET EVERY DAY 90 tablet 3  . Ferrous Sulfate (IRON) 325 (65 FE) MG TABS Take 1 tablet by mouth daily.      [name]  fexofenadine-pseudoephedrine (ALLEGRA-D ALLERGY & CONGESTION) 180-240 MG per 24 hr tablet Take 1 tablet by mouth daily. 30 tablet 11  . furosemide (LASIX) 20 MG tablet TAKE 1 TABLET EVERY DAY 90 tablet 3  . glucosamine-chondroitin 500-400 MG tablet Take 1 tablet by mouth 2 (two) times daily.      [name] losartan (COZAAR) 25 MG tablet TAKE 1 TABLET EVERY DAY 90 tablet 3  . metFORMIN (GLUCOPHAGE-XR) 500 MG 24 hr tablet Take 2 tablets (1,000 mg total) by mouth 2 (two) times daily. 360 tablet 3  . Multiple Vitamin (MULTIVITAMIN) tablet Take 1 tablet by mouth daily.      . Nutritional Supplements (PEPTAMEN 1.5 PO) Take by mouth 2 (two) times daily.    [name] omeprazole (PRILOSEC) 20 MG capsule Take 20 mg by mouth daily as needed.     . potassium chloride SA (K-DUR,KLOR-CON)  20 MEQ tablet Take 1 tablet (20 mEq total) by mouth daily. 90 tablet 3  . rosuvastatin (CRESTOR) 40 MG tablet TAKE 1 TABLET EVERY DAY 90 tablet 3  . SUMAtriptan (IMITREX) 20 MG/ACT nasal spray Place 1 spray (20 mg total) into the nose every 2 (two) hours as needed for migraine. 12 Inhaler 1  . topiramate (TOPAMAX) 100 MG tablet Take 1 tablet (100 mg total) by mouth 2 (two) times daily. 180 tablet 2  . vitamin E 200 UNIT capsule Take 200 Units by mouth daily.    . metoprolol succinate (TOPROL-XL) 25 MG 24 hr tablet Take 1 tablet (25 mg total) by mouth daily. (Patient not taking: Reported on 01/30/2014) 90 tablet 2   No current facility-administered medications on file prior to visit.    Allergies  Allergen Reactions  . Aspirin   . Coconut Oil   . Latex   . Lisinopril     REACTION: cough  . Peanut-Containing Drug Products   . Penicillins     REACTION: rash  . Prednisone   . Strawberry     Family History  Problem Relation Age of Onset  . Cancer Mother     Breast Cancer, Colon Cancer  . Asthma Mother   . Depression Mother   . Bipolar disorder Mother   . Dementia Mother   . Arthritis Mother   . Hypertension Father   . Migraines Father   . Cancer Maternal Aunt   . Heart disease Maternal Grandmother   . Cancer Maternal Grandfather     BP 128/88 mmHg  Pulse 85  Temp(Src) 98.3 F (36.8 C) (Oral)  Ht 5' 4.5" (1.638 m)  Wt 279 lb (126.554 kg)  BMI 47.17 kg/m2  Review of Systems Pt states slight arthralgias. Denies weight change.      Objective:   Physical Exam VITAL SIGNS:  See vs page GENERAL: no distress.   Morbid obesity NECK: multinodular goiter is again noted Pulses: dorsalis pedis intact bilat.   Feet: no deformity.  1+ bilat leg edema Skin:  no ulcer on the feet.  normal color and temp. Neuro: sensation is intact to touch on the feet  Lab Results  Component Value Date   WBC 5.4 01/30/2014   HGB 11.6* 01/30/2014   HCT 36.3 01/30/2014   PLT 254.0 01/30/2014     GLUCOSE 84 01/30/2014   CHOL 145 01/30/2014   TRIG 155.0* 01/30/2014   HDL 49.90 01/30/2014   LDLCALC 64 01/30/2014   ALT 11 01/30/2014   AST 18 01/30/2014   NA 137 01/30/2014   K 3.5 01/30/2014   CL 104  01/30/2014   CREATININE 0.9 01/30/2014   BUN 11 01/30/2014   CO2 27 01/30/2014   TSH 2.00 01/30/2014   HGBA1C 6.8* 01/30/2014   MICROALBUR 1.3 01/30/2014       Assessment & Plan:  DM: well-controlled.  Please continue the same medication. Dyslipidemia: well-controlled.  Please continue the same medication. HTN: good but not excellent control.  Please continue the same medication. DOE: usually due to deconditioning.      Subjective:   Patient here for Medicare annual wellness visit and management of other chronic and acute problems.     Risk factors: advanced age    43 of Physicians Providing Medical Care to Patient:  See "snapshot"   Activities of Daily Living: In your present state of health, do you have any difficulty performing the following activities?:  Preparing food and eating?: No  Bathing yourself: No  Getting dressed: No  Using the toilet:No  Moving around from place to place: No  In the past year have you fallen or had a near fall?:No    Home Safety: Has smoke detector and wears seat belts. No firearms.  Diet and Exercise  Current exercise habits: pt says not good Dietary issues discussed: pt says diet is improved.    Depression Screen  Q1: Over the past two weeks, have you felt down, depressed or hopeless? no  Q2: Over the past two weeks, have you felt little interest or pleasure in doing things? no   The following portions of the patient's history were reviewed and updated as appropriate: allergies, current medications, past family history, past medical history, past social history, past surgical history and problem list.   Review of Systems  Denies hearing loss, and visual loss Objective:   Vision:  Sees opthalmologist Hearing: grossly  normal Body mass index:  See vs page Msk: pt slowly performs "get-up-and-go" from a sitting position.   Cognitive Impairment Assessment: cognition, memory and judgment appear normal.  remembers 3/3 at 5 minutes.  excellent recall.  can easily read and write a sentence.  alert and oriented x 3.     Assessment:   Medicare wellness utd on preventive parameters    Plan:   During the course of the visit the patient was educated and counseled about appropriate screening and preventive services including:        Fall prevention   Screening mammography and Bone densitometry screening are done by Dr Leo Grosser Diabetes screening  Nutrition counseling   Vaccines / LABS Zostavax / Pneumococcal Vaccine today.    Patient Instructions (the written plan) was given to the patient.

## 2014-01-30 NOTE — Patient Instructions (Addendum)
blood tests are being requested for you today.  We'll contact you with results.   please consider these measures for your health:  minimize alcohol.  do not use tobacco products.  have a colonoscopy at least every 10 years from age 67.  Women should have an annual mammogram from age 95.  keep firearms safely stored.  always use seat belts.  have working smoke alarms in your home.  see an eye doctor and dentist regularly.  never drive under the influence of alcohol or drugs (including prescription drugs).   it is critically important to prevent falling down (keep floor areas well-lit, dry, and free of loose objects.  If you have a cane, walker, or wheelchair, you should use it, even for short trips around the house.  Also, try not to rush).   Please come back for a follow-up appointment in 6 months.

## 2014-01-30 NOTE — Progress Notes (Signed)
we discussed code status.  pt requests full code, but would not want to be started or maintained on artificial life-support measures if there was not a reasonable chance of recovery 

## 2014-01-31 LAB — LIPID PANEL
CHOL/HDL RATIO: 3
Cholesterol: 145 mg/dL (ref 0–200)
HDL: 49.9 mg/dL (ref >39.00)
LDL CALC: 64 mg/dL (ref 0–99)
NonHDL: 95.1
TRIGLYCERIDES: 155 mg/dL — AB (ref 0.0–149.0)
VLDL: 31 mg/dL (ref 0.0–40.0)

## 2014-01-31 LAB — HEPATIC FUNCTION PANEL
ALT: 11 U/L (ref 0–35)
AST: 18 U/L (ref 0–37)
Albumin: 3.4 g/dL — ABNORMAL LOW (ref 3.5–5.2)
Alkaline Phosphatase: 70 U/L (ref 39–117)
Bilirubin, Direct: 0 mg/dL (ref 0.0–0.3)
TOTAL PROTEIN: 7.5 g/dL (ref 6.0–8.3)
Total Bilirubin: 0.3 mg/dL (ref 0.2–1.2)

## 2014-01-31 LAB — BASIC METABOLIC PANEL
BUN: 11 mg/dL (ref 6–23)
CHLORIDE: 104 meq/L (ref 96–112)
CO2: 27 mEq/L (ref 19–32)
CREATININE: 0.9 mg/dL (ref 0.4–1.2)
Calcium: 8.8 mg/dL (ref 8.4–10.5)
GFR: 84.57 mL/min (ref >60.00)
Glucose, Bld: 84 mg/dL (ref 70–99)
Potassium: 3.5 mEq/L (ref 3.5–5.1)
Sodium: 137 mEq/L (ref 135–145)

## 2014-02-01 ENCOUNTER — Ambulatory Visit: Payer: Self-pay | Admitting: Neurology

## 2014-02-05 ENCOUNTER — Ambulatory Visit: Payer: Medicare Other | Admitting: Neurology

## 2014-02-05 ENCOUNTER — Telehealth: Payer: Self-pay | Admitting: *Deleted

## 2014-02-05 NOTE — Telephone Encounter (Signed)
Calling patient to r/s appointment for today with Dr Dohmeier no answer left message to return the call.

## 2014-02-06 NOTE — Telephone Encounter (Signed)
I spoke to pts husband.  They are taking -10 days cruise.   Speaking with Cheral Marker. In sleep lab, last noted sleep test one year ago showed 89%O2 on RA.  [facility] is DME co did relay that they did offer free o2 (would last only 2-3 days).  Would relay to Dr.  Brett Fairy and see her recs.

## 2014-02-06 NOTE — Telephone Encounter (Signed)
Please clarify with the cruiseline- they may be able to have oxygen for the patient on board instead of hrer carrying it on board.  If that's not case , please use  your oxygen for 3 hours only , so it last for  10 nights.

## 2014-02-07 NOTE — Telephone Encounter (Signed)
I called pt and relayed the message per Dr. Brett Fairy as per below.  She is asking about flying with o2 and if she can just use the cpap machine and no oxygen.  I told her that she is to make list of questions and ask on her appt that she has on Monday 02-12-14.  She verbalized understanding.

## 2014-02-12 ENCOUNTER — Ambulatory Visit (INDEPENDENT_AMBULATORY_CARE_PROVIDER_SITE_OTHER): Payer: Medicare Other | Admitting: Neurology

## 2014-02-12 ENCOUNTER — Encounter: Payer: Self-pay | Admitting: Neurology

## 2014-02-12 VITALS — BP 125/71 | HR 91 | Resp 20 | Ht 64.75 in | Wt 278.6 lb

## 2014-02-12 DIAGNOSIS — R0902 Hypoxemia: Secondary | ICD-10-CM | POA: Diagnosis not present

## 2014-02-12 DIAGNOSIS — E662 Morbid (severe) obesity with alveolar hypoventilation: Secondary | ICD-10-CM

## 2014-02-12 DIAGNOSIS — Z9989 Dependence on other enabling machines and devices: Principal | ICD-10-CM

## 2014-02-12 DIAGNOSIS — I2510 Atherosclerotic heart disease of native coronary artery without angina pectoris: Secondary | ICD-10-CM

## 2014-02-12 DIAGNOSIS — G4733 Obstructive sleep apnea (adult) (pediatric): Secondary | ICD-10-CM | POA: Diagnosis not present

## 2014-02-12 MED ORDER — TOPIRAMATE 100 MG PO TABS: 100.0000 mg | ORAL_TABLET | Freq: Every day | ORAL | Status: DC

## 2014-02-12 NOTE — Progress Notes (Signed)
[facility]  Provider:  Larey Seat, M D  Referring Provider: [name], MD Primary Care Physician:  [name], MD  Chief Complaint  Patient presents with  . RV sleep cpap    Rm 10,  husband    HPI:  Heather [name] is a 68 y.o. female ,seen here as a revisit after her sleep study.     Heather [name], a  68 year old right-handed [place] American female , is a former patient of Dr. Elvia Collum and is followed now by Dr. Floyde Parkins here at [facility]. The patient's pulmonologist, Dr. Chase Caller, recommended a review of her sleep breathing disorder in light of a new  diagnosis of nocturnal hypoxemia. The patient desaturates according to her her husband and from recollection of ONO data  into the 60s and 70s at night ( as documented on a home based  pulse oximetry). Cardiologist evaluation  found hypoxemia . The patient started per cardiologist on Losartin and ASA. She was referred by Dr. Doy Mince in June 2011 for a sleep study based on chief complaints of snoring, witnessed apneas and excessive daytime sleepiness. At the time the patient endorsed  the Epworth sleepiness score at a very high count of 20/24 points and the Beck's inventory at 3 points, her BMI was 49.9, and her neck conference measured 14.5 inches . She was diagnosed with OSA after her study revealed an AHI of 11.7 (which constitutes mild apnea). During REM sleep, her AHI increased to 68.2/hr.  The oxygen  desaturations associated with REM sleep in supine sleep position  were suspected to be a cause for the patient's excessive daytime sleepiness and the reported morning headaches. The patient had reported at that time that she would prefer to sleep up sitting in a chair or recliner.I saw Heather [name] last she has been compliantly using her BiPAP machine.  she underwent a split night polysomnography on 12-13-12 and had endorsed the Epworth sleepiness score at 21 points/  her  neck circumference was 14.5 inches/  BMI 48.4.  The  patient had an AHI of 46.8 which constitutes severe apnea RDI was 48.7/ hr . The Co2 retention was not found (by a CMS criteria) but she was at a level of 49.4 torr, very close to the threshold of 50 Torr. [name] Respiratory events were hypopneas and not frank apneas. Oxygen lowest saturation was 75% but 110 minutes of desaturation during the baseline part of this but study. The patient was titrated to a BiPAP setting of 11/7 cm water under which she slept 120 minutes and reached finally REM sleep. The patient tolerated BiPAP much better than CPAP , an ESON  medium size nasal mask was used . She had prolonged hypoxemia persisted in spite of CPAP and BiPAP use at various times during the night -02  nadir of 78% during the in lab titration.   06-21-13  The last compliance data for a 30 day download were dated 04-07-13. The patient's compliance was 90%, average time of CPAP or BiPAP use was 5 hours and 43 minutes. The patient uses a BiPAP machine a so-called easy breath ,  inspiratory pressure support of 11 cm of water and expiratory pressure of 7 cm. The patient has an AHI of 0.9, she is compliant  By CMS criteria .  I was able to compared downloads from [facility] beginning November 2014 to our in office data. The patient is using BiPAP  machine and has an inspiratory  pressure of 11 cm and expiratory pressure of 7 cm water.  She has less nocturia and less dry mouth in the morning.   Taking Lasix to late will cause her nocturia. She is in PT .  02-12-14 Mrs. Bradish's compliance reports dating from 52-13 through 02-10-14 shows 100% compliance 4 days of use and 93% compliance for over 4 hours of daily use. Average use of the machine is 5 hours and 12 minutes. The IPAP is set at 11 cm and the expiratory pressure at 7 cm AHI residual at 0.9 with excellent she has a moderate air leak but it does not seem to influence the overall apnea count I would not  like to change any of the settings I would offer to change the mask should the patient feels that there is a problem with the fit.  FSS 49 and Epworth 15 , still elevated, BMI still severely elevated.     Review of Systems: Out of a complete 14 system review, the patient complains of only the following symptoms, and all other reviewed systems are negative. The patient has SOB, morbidly obese, has CHF and ankle edema.  Epworth is 15  from 20 pre CPAP,  FSS 49 from 55   GDS at 2 points ,     History   Social History  . Marital Status: Married    Spouse Name: Heather [name]    Number of Children: 2  . Years of Education: College   Occupational History  . Retired    Social History Main Topics  . Smoking status: Former Smoker -- 2.00 packs/day for 20 years    Types: Cigarettes    Quit date: 03/03/1979  . Smokeless tobacco: Never Used  . Alcohol Use: No  . Drug Use: No  . Sexual Activity: Yes    Birth Control/ Protection: Surgical   Other Topics Concern  . Not on file   Social History Narrative   Patient is married Heather [name]).   Patient has two children.   Patient is retired.   Patient has a college education.   Patient is right-handed.   Patient lives at home with family.   Caffeine Use: 2 soda every other day    Family History  Problem Relation Age of Onset  . Cancer Mother     Breast Cancer, Colon Cancer  . Asthma Mother   . Depression Mother   . Bipolar disorder Mother   . Dementia Mother   . Arthritis Mother   . Hypertension Father   . Migraines Father   . Cancer Maternal Aunt   . Heart disease Maternal Grandmother   . Cancer Maternal Grandfather     Past Medical History  Diagnosis Date  . ALLERGIC RHINITIS 10/22/2006  . ANEMIA 12/18/2008  . ASYMPTOMATIC POSTMENOPAUSAL STATUS 11/22/2007  . DIABETES MELLITUS, TYPE II 01/13/2007  . GERD 07/20/2007  . GOITER, MULTINODULAR 07/20/2007  . Headache(784.0) 07/20/2007  . HEARING LOSS 11/22/2007  .  HYPERCHOLESTEROLEMIA 01/13/2007  . HYPERTENSION 10/22/2006  . OSTEOARTHRITIS 10/22/2006  . Other chronic nonalcoholic liver disease [birth year]76[birth year]1950  . Hyperglycemia   . NASH (nonalcoholic steatohepatitis)   . Fibroid   . Hx of colposcopy with cervical biopsy   . Abnormal Pap smear   . Complication of anesthesia   . Dyslipidemia   . Obesity   . Obstructive sleep apnea   . Degenerative arthritis   . Nocturnal hypoxemia 02/17/2013    Past Surgical History  Procedure Laterality Date  . Esophagogastroduodenoscopy  12/08/2005  .  Stress cardiolite  10/21/2005  . Electrocardiogram  10/15/2006  . Dexa  08/2005  . Wisdom tooth extraction    . Breast surgery      Breast reduction  . Sweat gland removal    . Dilation and curettage of uterus    . Eye surgery      Current Outpatient Prescriptions  Medication Sig Dispense Refill  . acetaminophen (TYLENOL) 500 MG tablet Take 500 mg by mouth 3 (three) times daily as needed for pain.    . Calcium Carbonate-Vitamin D (CALCIUM 600 + D PO) Take 2 tablets by mouth daily.      . clopidogrel (PLAVIX) 75 MG tablet TAKE 1 TABLET EVERY DAY 90 tablet 3  . Ferrous Sulfate (IRON) 325 (65 FE) MG TABS Take 1 tablet by mouth daily.      . fexofenadine-pseudoephedrine (ALLEGRA-D ALLERGY & CONGESTION) 180-240 MG per 24 hr tablet Take 1 tablet by mouth daily. 30 tablet 11  . furosemide (LASIX) 20 MG tablet TAKE 1 TABLET EVERY DAY 90 tablet 3  . glucosamine-chondroitin 500-400 MG tablet Take 1 tablet by mouth 2 (two) times daily.      [name] losartan (COZAAR) 25 MG tablet TAKE 1 TABLET EVERY DAY 90 tablet 3  . metFORMIN (GLUCOPHAGE-XR) 500 MG 24 hr tablet Take 2 tablets (1,000 mg total) by mouth 2 (two) times daily. (Patient taking differently: Take 1,000 mg by mouth daily. ) 360 tablet 3  . Multiple Vitamin (MULTIVITAMIN) tablet Take 1 tablet by mouth daily.      . Nutritional Supplements (PEPTAMEN 1.5 PO) Take by mouth 2 (two) times daily.    [name] omeprazole (PRILOSEC) 20 MG  capsule Take 20 mg by mouth daily as needed.     . potassium chloride SA (K-DUR,KLOR-CON) 20 MEQ tablet Take 1 tablet (20 mEq total) by mouth daily. 90 tablet 3  . rosuvastatin (CRESTOR) 40 MG tablet TAKE 1 TABLET EVERY DAY 90 tablet 3  . SUMAtriptan (IMITREX) 20 MG/ACT nasal spray Place 1 spray (20 mg total) into the nose every 2 (two) hours as needed for migraine. 12 Inhaler 1  . topiramate (TOPAMAX) 100 MG tablet Take 1 tablet (100 mg total) by mouth 2 (two) times daily. (Patient taking differently: Take 100 mg by mouth daily. ) 180 tablet 2  . vitamin E 200 UNIT capsule Take 200 Units by mouth daily.    . clindamycin (CLEOCIN) 150 MG capsule   0  . SF 5000 PLUS 1.1 % CREA dental cream   0   No current facility-administered medications for this visit.    Allergies as of 02/12/2014 - Review Complete 02/12/2014  Allergen Reaction Noted  . Aspirin  08/04/2011  . Coconut oil  07/10/2013  . Latex  08/03/2011  . Lisinopril  06/03/2006  . Metoprolol Itching 02/12/2014  . Peanut-containing drug products  07/10/2013  . Penicillins  06/03/2006  . Prednisone  07/12/2012  . Strawberry  07/10/2013    Vitals: BP 125/71 mmHg  Pulse 91  Resp 20  Ht 5' 4.75" (1.645 m)  Wt 278 lb 8.8 oz (126.349 kg)  BMI 46.69 kg/m2 Last Weight:  Wt Readings from Last 1 Encounters:  02/12/14 278 lb 8.8 oz (126.349 kg)   Last Height:   Ht Readings from Last 1 Encounters:  02/12/14 5' 4.75" (1.645 m)    Physical exam:  General: The patient is awake, alert and appears not in acute distress. The patient is well groomed. Head: Normocephalic, atraumatic. Neck is supple. Mallampati  3 , neck circumference: 15.5 , no nasal deviation but slightly nasal voice.  Cardiovascular:  Regular rate and rhythm , without  murmurs or carotid bruit, and without distended neck veins. Respiratory: Lungs are clear to auscultation. Skin:  With evidence of  Ankle edema 2 plus , no rash. Trunk: BMI is significantly  elevated .  The  patient  has normal posture.  Neurologic exam : The patient is awake and alert, oriented to place and time.  Memory subjective  described as intact. There is a normal attention span & concentration ability. Speech is fluent without  dysarthria, dysphonia or aphasia. Mood and affect are appropriate.  Cranial nerves: No loss of taste and smell, Pupils are equal and briskly reactive to light. Funduscopic exam without  evidence of pallor or edema. She is status post cataract ,  Extraocular movements  in vertical and horizontal planes intact and without nystagmus. Visual fields by finger perimetry are intact. Hearing to finger rub intact.  Facial sensation intact to fine touch. Facial motor strength is symmetric and tongue and uvula move midline.  Motor exam:   Reduced muscle tone, no rigor, and no cog-wheeling.  The patient is morbidly obese - the muscle bulk is difficult to assess-  Evidence of symmetrically  reduced proximal muscle strength in all extremities. (Thigh- upper arm)   Coordination: Rapid alternating movements in the fingers/hands is tested and normal.  Finger-to-nose maneuver tested and normal without evidence of ataxia, dysmetria or tremor.  Gait and station: Patient walks without assistive device - her  Stance is wide based, Tandem gait is fragmented. She has trouble turning, has  trouble managing stairs , one foot at a time.   The patient walks deliberately slow and measured.  Romberg testing is positive , she sways to the right and forward, this propulsive trend is  not present when her eyes are open .   Reported no fallls in 24 month.   Deep tendon reflexes: in the upper and lower extremities are symmetrically attenuated.  Babinski maneuver response is  downgoing.   Assessment:   After physical and neurologic examination, review of sleep studies, and pre-existing records, assessment is that of a patient with morbid obesity , morning headaches and known OSA , she gained  weight and lost muscle tone. Her headaches have been treated by Dr Jannifer Franklin.  She has documented Edema, SOB and hypoxia at night.   Plan:  Treatment plan and additional workup :   She  has been using an oxygen concentrator based on January 2015s ONO result with 2 .21 hours of hypoxemia while on PAP, since then she noted  improvement in Epworth and FSS.  Rv every 12 month sleep clinic with PAP machine.   Her headaches have resolved on CPAP and she has seen Dr Jannifer Franklin, her primary neurologist for these last in September 2015.

## 2014-02-12 NOTE — Patient Instructions (Signed)
Oxygen Use at Home Oxygen can be prescribed for home use. The prescription will show the flow rate. This is how much oxygen is to be used per minute. This will be listed in liters per minute (LPM or L/M). A liter is a metric measurement of volume. You will use oxygen therapy as directed. It can be used while exercising, sleeping, or at rest. You may need oxygen continuously. Your health care provider may order a blood oxygen test (arterial blood gas or pulse oximetry test) that will show what your oxygen level is. Your health care provider will use these measurements to learn about your needs and follow your progress. Home oxygen therapy is commonly used on patients with various lung (pulmonary) related conditions. Some of these conditions include:  Asthma.  Lung cancer.  Pneumonia.  Emphysema.  Chronic bronchitis.  Cystic fibrosis.  Other lung diseases.  Pulmonary fibrosis.  Occupational lung disease.  Heart failure.  Chronic obstructive pulmonary disease (COPD). 3 COMMON WAYS OF PROVIDING OXYGEN THERAPY  Gas: The gas form of oxygen is put into variously sized cylinders or tanks. The cylinders or oxygen tanks contain compressed oxygen. The cylinder is equipped with a regulator that controls the flow rate. Because the flow of oxygen out of the cylinder is constant, an oxygen conserving device may be attached to the system to avoid waste. This device releases the gas only when you inhale and cuts it off when you exhale. Oxygen can be provided in a small cylinder that can be carried with you. Large tanks are heavy and are only for stationary use. After use, empty tanks must be exchanged for full tanks.  Liquid: The liquid form of oxygen is put into a container similar to a thermos. When released, the liquid converts to a gas and you breathe it in just like the compressed gas. This storage method takes up less space than the compressed gas cylinder, and you can transfer the liquid to a  small, portable vessel at home. Liquid oxygen is more expensive than the compressed gas, and the vessel vents when not in use. An oxygen conserving device may be built into the vessel to conserve the oxygen. Liquid oxygen is very cold, around 297 below zero.  Oxygen concentrator: This medical device filters oxygen from room air and gives almost 100% oxygen to the patient. Oxygen concentrators are powered by electricity. Benefits of this system are:  It does not need to be resupplied.  It is not as costly as liquid oxygen.  Extra tubing permits the user to move around easier. There are several types of small, portable oxygen systems available which can help you remain active and mobile. You must have a cylinder of oxygen as a backup in the event of a power failure. Advise your electric power company that you are on oxygen therapy in order to get priority service when there is a power failure. OXYGEN DELIVERY DEVICES There are 3 common ways to deliver oxygen to your body.  Nasal cannula. This is a 2-pronged device inserted in the nostrils that is connected to tubing carrying the oxygen. The tubing can rest on the ears or be attached to the frame of eyeglasses.  Mask. People who need a high flow of oxygen generally use a mask.  Transtracheal catheter. Transtracheal oxygen therapy requires the insertion of a small, flexible tube (catheter) in the windpipe (trachea). This catheter is held in place by a necklace. Since transtracheal oxygen bypasses the mouth, nose, and throat, a humidifier is  absolutely required at flow rates of 1 LPM or greater. OXYGEN USE SAFETY TIPS  Never smoke while using oxygen. Oxygen does not burn or explode, but flammable materials will burn faster in the presence of oxygen.  Keep a Data processing manager close by. Let your fire department know that you have oxygen in your home.  Warn visitors not to smoke near you when you are using oxygen. Put up "no smoking" signs in your  home where you most often use the oxygen.  When you go to a restaurant with your portable oxygen source, ask to be seated in the nonsmoking section.  Stay at least 5 feet away from gas stoves, candles, lighted fireplaces, or other heat sources.  Do not use materials that burn easily (flammable) while using your oxygen.  If you use an oxygen cylinder, make sure it is secured to some fixed object or in a stand. If you use liquid oxygen, make sure the vessel is kept upright to keep the oxygen from pouring out. Liquid oxygen is so cold it can hurt your skin.  If you use an oxygen concentrator, call your electric company so you will be given priority service if your power goes out. Avoid using extension cords, if possible.  Regularly test your smoke detectors at home to make sure they work. If you receive care in your home from a nurse or other health care provider, he or she may also check to make sure your smoke detectors work. GUIDELINES FOR CLEANING YOUR EQUIPMENT  Wash the nasal prongs with a liquid soap. Thoroughly rinse them once or twice a week.  Replace the prongs every 2 to 4 weeks. If you have an infection (cold, pneumonia) change them when you are well.  Your health care provider will give you instructions on how to clean your transtracheal catheter.  The humidifier bottle should be washed with soap and warm water and rinsed thoroughly between each refill. Air-dry the bottle before filling it with sterile or distilled water. The bottle and its top should be disinfected after they are cleaned.  If you use an oxygen concentrator, unplug the unit. Then wipe down the cabinet with a damp cloth and dry it daily. The air filter should be cleaned at least twice a week.  Follow your home medical equipment and service company's directions for cleaning the compressor filter. HOME CARE INSTRUCTIONS   Do not change the flow of oxygen unless directed by your health care provider.  Do not use  alcohol or other sedating drugs unless instructed. They slow your breathing rate.  Do not use materials that burn easily (flammable) while using your oxygen.  Always keep a spare tank of oxygen. Plan ahead for holidays when you may not be able to get a prescription filled.  Use water-based lubricants on your lips or nostrils. Do not use an oil-based product like petroleum jelly.  To prevent your cheeks or the skin behind your ears from becoming irritated, tuck some gauze under the tubing.  If you have persistent redness under your nose, call your health care provider.  When you no longer need oxygen, your doctor will have the oxygen discontinued. Oxygen is not addicting or habit forming.  Use the oxygen as instructed. Too much oxygen can be harmful and too little will not give you the benefit you need.  Shortness of breath is not always from a lack of oxygen. If your oxygen level is not the cause of your shortness of breath, taking oxygen will  not help. SEEK MEDICAL CARE IF:   You have frequent headaches.  You have shortness of breath or a lasting cough.  You have anxiety.  You are confused.  You are drowsy or sleepy all the time.  You develop an illness which aggravates your breathing.  You cannot exercise.  You are restless.  You have blue lips or fingernails.  You have difficult or irregular breathing and it is getting worse.  You have a fever. Document Released: 05/09/2003 Document Revised: 07/03/2013 Document Reviewed: 09/28/2012 [facility] Patient Information 2015 [employer], [place]. This information is not intended to replace advice given to you by your health care provider. Make sure you discuss any questions you have with your health care provider.

## 2014-05-02 ENCOUNTER — Encounter: Payer: Self-pay | Admitting: Neurology

## 2014-05-03 ENCOUNTER — Telehealth: Payer: Self-pay | Admitting: Neurology

## 2014-05-03 NOTE — Telephone Encounter (Addendum)
Spoke to patient and she requested clarification on her medicine.  She had discontinued the Metoprolol but wanted to know if the Topamax was also stopped.  I relayed according to her office notes she is to continue the Topamax.  She verbalized understanding.

## 2014-05-03 NOTE — Telephone Encounter (Signed)
Patient is returning a call. °

## 2014-06-21 ENCOUNTER — Ambulatory Visit: Payer: Medicare Other | Admitting: Neurology

## 2014-06-22 ENCOUNTER — Ambulatory Visit: Payer: Medicare Other | Admitting: Neurology

## 2014-06-30 IMAGING — CR DG UGI W/ HIGH DENSITY W/KUB
1 series · 1 of 1 positions shown · non-contrast
Comparison: DG LUMBAR SPINE 2-3V dated 05/24/2013; CT CHEST W/O CM
dated 05/26/2013

CLINICAL DATA: Large Hiatal hernia,

EXAM:
UPPER GI SERIES WITH KUB
TECHNIQUE: After obtaining a scout radiograph a routine upper GI series was
performed using a combined double contrast and single contrast
examination utilizing effervescent crystals, thick barium liquid,
and thin barium liquid.
FLUOROSCOPY TIME:  2 min 23 seconds

[view not recorded]
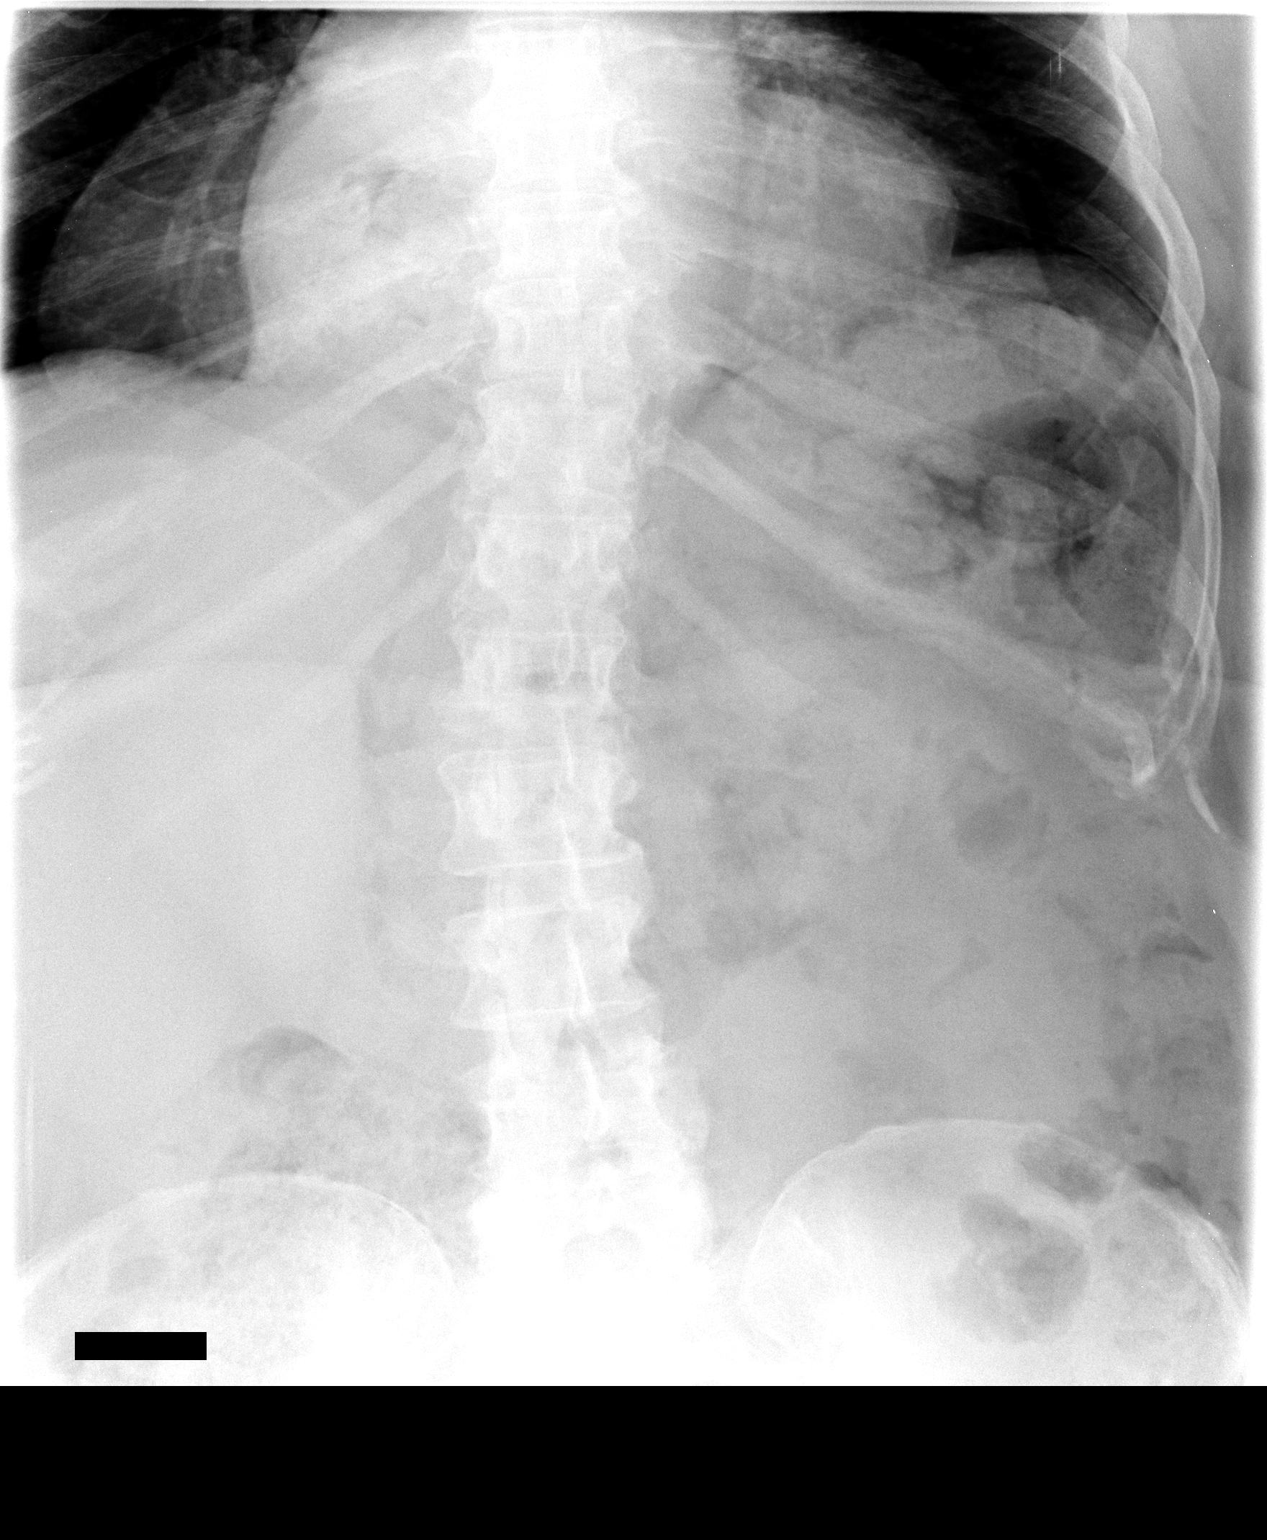

[1 of 1 positions shown; findings below may reference images not displayed]

FINDINGS: Air is seen within nondilated loops of small bowel. A mild to
moderate amount of fecal retention. There is mild dextroscoliosis
with a rotatory component.

There is shortening of the thoracic esophagus secondary to a large
paraesophageal hiatal hernia, otherwise grossly unremarkable. The
stomach is completely herniated into the right hemithorax.

There is no evidence of gastroesophageal reflux. The duodenal bulb
is unremarkable. The duodenum is mild repositioned extending to the
esophageal hiatus. There is no evidence of duodenal narrowing,
dilation, nor outpouchings. Contrast peristalsis into the duodenum
and proximal small bowel without complications.
IMPRESSION: Large periesophageal paraesophageal hiatal hernia with displacement
of the stomach into the right hemi thorax. These findings are
consistent with patient's history.

## 2014-07-12 ENCOUNTER — Encounter: Payer: Self-pay | Admitting: Endocrinology

## 2014-07-13 ENCOUNTER — Other Ambulatory Visit: Payer: Self-pay

## 2014-07-13 MED ORDER — POTASSIUM CHLORIDE CRYS ER 20 MEQ PO TBCR: 20.0000 meq | EXTENDED_RELEASE_TABLET | Freq: Every day | ORAL | Status: DC

## 2014-08-01 ENCOUNTER — Ambulatory Visit (INDEPENDENT_AMBULATORY_CARE_PROVIDER_SITE_OTHER): Payer: Medicare Other | Admitting: Endocrinology

## 2014-08-01 VITALS — BP 116/74 | HR 94 | Temp 98.2°F | Ht 64.75 in | Wt 271.0 lb

## 2014-08-01 DIAGNOSIS — M25561 Pain in right knee: Secondary | ICD-10-CM

## 2014-08-01 DIAGNOSIS — E119 Type 2 diabetes mellitus without complications: Secondary | ICD-10-CM

## 2014-08-01 DIAGNOSIS — R634 Abnormal weight loss: Secondary | ICD-10-CM

## 2014-08-01 LAB — CBC WITH DIFFERENTIAL/PLATELET
Basophils Absolute: 0 10*3/uL (ref 0.0–0.1)
Basophils Relative: 0.5 % (ref 0.0–3.0)
EOS ABS: 0.1 10*3/uL (ref 0.0–0.7)
Eosinophils Relative: 1.5 % (ref 0.0–5.0)
HEMATOCRIT: 39.1 % (ref 36.0–46.0)
Hemoglobin: 12.4 g/dL (ref 12.0–15.0)
LYMPHS PCT: 27.8 % (ref 12.0–46.0)
Lymphs Abs: 1.4 10*3/uL (ref 0.7–4.0)
MCHC: 31.8 g/dL (ref 30.0–36.0)
MCV: 86.7 fl (ref 78.0–100.0)
Monocytes Absolute: 0.4 10*3/uL (ref 0.1–1.0)
Monocytes Relative: 8.5 % (ref 3.0–12.0)
Neutro Abs: 3.1 10*3/uL (ref 1.4–7.7)
Neutrophils Relative %: 61.7 % (ref 43.0–77.0)
PLATELETS: 259 10*3/uL (ref 150.0–400.0)
RBC: 4.51 Mil/uL (ref 3.87–5.11)
RDW: 19 % — AB (ref 11.5–15.5)
WBC: 5.1 10*3/uL (ref 4.0–10.5)

## 2014-08-01 LAB — BASIC METABOLIC PANEL
BUN: 12 mg/dL (ref 6–23)
CALCIUM: 9.4 mg/dL (ref 8.4–10.5)
CHLORIDE: 103 meq/L (ref 96–112)
CO2: 31 meq/L (ref 19–32)
Creatinine, Ser: 0.91 mg/dL (ref 0.40–1.20)
GFR: 79.12 mL/min (ref >60.00)
Glucose, Bld: 102 mg/dL — ABNORMAL HIGH (ref 70–99)
Potassium: 3.4 mEq/L — ABNORMAL LOW (ref 3.5–5.1)
SODIUM: 139 meq/L (ref 135–145)

## 2014-08-01 LAB — HEMOGLOBIN A1C: Hgb A1c MFr Bld: 6.3 % (ref 4.6–6.5)

## 2014-08-01 LAB — URIC ACID: Uric Acid, Serum: 2.8 mg/dL (ref 2.4–7.0)

## 2014-08-01 LAB — TSH: TSH: 1.26 u[IU]/mL (ref 0.35–4.50)

## 2014-08-01 NOTE — Patient Instructions (Addendum)
blood tests are requested for you today.  We'll let you know about the results. Please see physical therapy.  you will receive a phone call, about a day and time for an appointment. Please weight yourself today, then each week, and call if you lose 10 lbs from today.  Please come back for a "medicare wellness" appointment in 6 months (must be after 01/31/15).

## 2014-08-01 NOTE — Progress Notes (Signed)
Subjective:    Patient ID: Heather [name], female    DOB: Aug 18, 1945, 69 y.o.   MRN: [id number]  HPI  Pt states slight weight loss, but no pain at the abdomen.  She has assoc decreased appetite.   She has chronic and ongoing right knee pain.   She takes fe 1/day.  Past Medical History  Diagnosis Date  . ALLERGIC RHINITIS 10/22/2006  . ANEMIA 12/18/2008  . ASYMPTOMATIC POSTMENOPAUSAL STATUS 11/22/2007  . DIABETES MELLITUS, TYPE II 01/13/2007  . GERD 07/20/2007  . GOITER, MULTINODULAR 07/20/2007  . Headache(784.0) 07/20/2007  . HEARING LOSS 11/22/2007  . HYPERCHOLESTEROLEMIA 01/13/2007  . HYPERTENSION 10/22/2006  . OSTEOARTHRITIS 10/22/2006  . Other chronic nonalcoholic liver disease 6/94/8546  . Hyperglycemia   . NASH (nonalcoholic steatohepatitis)   . Fibroid   . Hx of colposcopy with cervical biopsy   . Abnormal Pap smear   . Complication of anesthesia   . Dyslipidemia   . Obesity   . Obstructive sleep apnea   . Degenerative arthritis   . Nocturnal hypoxemia 02/17/2013    Past Surgical History  Procedure Laterality Date  . Esophagogastroduodenoscopy  12/08/2005  . Stress cardiolite  10/21/2005  . Electrocardiogram  10/15/2006  . Dexa  08/2005  . Wisdom tooth extraction    . Breast surgery      Breast reduction  . Sweat gland removal    . Dilation and curettage of uterus    . Eye surgery      History   Social History  . Marital Status: Married    Spouse Name: [name]  . Number of Children: 2  . Years of Education: College   Occupational History  . Retired    Social History Main Topics  . Smoking status: Former Smoker -- 2.00 packs/day for 20 years    Types: Cigarettes    Quit date: 03/03/1979  . Smokeless tobacco: Never Used  . Alcohol Use: No  . Drug Use: No  . Sexual Activity: Yes    Birth Control/ Protection: Surgical   Other Topics Concern  . Not on file   Social History Narrative   Patient is married [name]).   Patient has two children.   Patient is retired.   Patient has a college education.   Patient is right-handed.   Patient lives at home with family.   Caffeine Use: 2 soda every other day    Current Outpatient Prescriptions on File Prior to Visit  Medication Sig Dispense Refill  . acetaminophen (TYLENOL) 500 MG tablet Take 500 mg by mouth 3 (three) times daily as needed for pain.    . Calcium Carbonate-Vitamin D (CALCIUM 600 + D PO) Take 2 tablets by mouth daily.      . clopidogrel (PLAVIX) 75 MG tablet TAKE 1 TABLET EVERY DAY 90 tablet 3  . Ferrous Sulfate (IRON) 325 (65 FE) MG TABS Take 1 tablet by mouth daily.      . fexofenadine-pseudoephedrine (ALLEGRA-D ALLERGY & CONGESTION) 180-240 MG per 24 hr tablet Take 1 tablet by mouth daily. 30 tablet 11  . furosemide (LASIX) 20 MG tablet TAKE 1 TABLET EVERY DAY 90 tablet 3  . glucosamine-chondroitin 500-400 MG tablet Take 1 tablet by mouth 2 (two) times daily.      [name] losartan (COZAAR) 25 MG tablet TAKE 1 TABLET EVERY DAY 90 tablet 3  . metFORMIN (GLUCOPHAGE-XR) 500 MG 24 hr tablet Take 2 tablets (1,000 mg total) by mouth 2 (two) times daily. (Patient taking differently:  Take 1,000 mg by mouth daily. ) 360 tablet 3  . Multiple Vitamin (MULTIVITAMIN) tablet Take 1 tablet by mouth daily.      . Nutritional Supplements (PEPTAMEN 1.5 PO) Take by mouth 2 (two) times daily.    [name] omeprazole (PRILOSEC) 20 MG capsule Take 20 mg by mouth daily as needed.     . potassium chloride SA (K-DUR,KLOR-CON) 20 MEQ tablet Take 1 tablet (20 mEq total) by mouth daily. 90 tablet 3  . rosuvastatin (CRESTOR) 40 MG tablet TAKE 1 TABLET EVERY DAY 90 tablet 3  . SF 5000 PLUS 1.1 % CREA dental cream   0  . SUMAtriptan (IMITREX) 20 MG/ACT nasal spray Place 1 spray (20 mg total) into the nose every 2 (two) hours as needed for migraine. 12 Inhaler 1  . topiramate (TOPAMAX) 100 MG tablet Take 1 tablet (100 mg total) by mouth daily. 90 tablet 3  . vitamin E 200 UNIT capsule Take 200 Units by mouth daily.       No current facility-administered medications on file prior to visit.    Allergies  Allergen Reactions  . Aspirin   . Coconut Oil   . Latex   . Lisinopril     REACTION: cough  . Metoprolol Itching  . Peanut-Containing Drug Products   . Penicillins     REACTION: rash  . Prednisone   . Strawberry     Family History  Problem Relation Age of Onset  . Cancer Mother     Breast Cancer, Colon Cancer  . Asthma Mother   . Depression Mother   . Bipolar disorder Mother   . Dementia Mother   . Arthritis Mother   . Hypertension Father   . Migraines Father   . Cancer Maternal Aunt   . Heart disease Maternal Grandmother   . Cancer Maternal Grandfather     BP 116/74 mmHg  Pulse 94  Temp(Src) 98.2 F (36.8 C) (Oral)  Ht 5' 4.75" (1.645 m)  Wt 271 lb (122.925 kg)  BMI 45.43 kg/m2  SpO2 94%  Review of Systems Denies dysphagia and depression.     Objective:   Physical Exam VITAL SIGNS:  See vs page.   GENERAL: no distress.   Pulses: dorsalis pedis intact bilat.   MSK: no deformity of the feet.  Right knee is normal.  Gait: normal and steady.  CV: no leg edema.   Skin:  no ulcer on the feet.  normal color and temp on the feet.   Neuro: sensation is intact to touch on the feet.    Lab Results  Component Value Date   WBC 5.1 08/01/2014   HGB 12.4 08/01/2014   HCT 39.1 08/01/2014   MCV 86.7 08/01/2014   PLT 259.0 08/01/2014   Lab Results  Component Value Date   CREATININE 0.91 08/01/2014   BUN 12 08/01/2014   NA 139 08/01/2014   K 3.4* 08/01/2014   CL 103 08/01/2014   CO2 31 08/01/2014   Lab Results  Component Value Date   TSH 1.26 08/01/2014       Assessment & Plan:  Weight loss, new, uncertain etiology.   Anemia, resolved.  Knee pain, persistent.     Patient is advised the following: Patient Instructions  blood tests are requested for you today.  We'll let you know about the results. Please see physical therapy.  you will receive a phone call,  about a day and time for an appointment. Please weight yourself today, then each  week, and call if you lose 10 lbs from today.  Please come back for a "medicare wellness" appointment in 6 months (must be after 01/31/15).

## 2014-08-15 ENCOUNTER — Encounter: Payer: Self-pay | Admitting: Nurse Practitioner

## 2014-08-15 ENCOUNTER — Ambulatory Visit (INDEPENDENT_AMBULATORY_CARE_PROVIDER_SITE_OTHER): Payer: Medicare Other | Admitting: Nurse Practitioner

## 2014-08-15 VITALS — BP 110/69 | HR 96 | Ht 64.5 in | Wt 276.5 lb

## 2014-08-15 DIAGNOSIS — G4734 Idiopathic sleep related nonobstructive alveolar hypoventilation: Secondary | ICD-10-CM | POA: Diagnosis not present

## 2014-08-15 DIAGNOSIS — G4733 Obstructive sleep apnea (adult) (pediatric): Secondary | ICD-10-CM

## 2014-08-15 DIAGNOSIS — G43009 Migraine without aura, not intractable, without status migrainosus: Secondary | ICD-10-CM

## 2014-08-15 DIAGNOSIS — Z9989 Dependence on other enabling machines and devices: Secondary | ICD-10-CM

## 2014-08-15 MED ORDER — SUMATRIPTAN 20 MG/ACT NA SOLN: 1.0000 | NASAL | Status: DC | PRN

## 2014-08-15 NOTE — Patient Instructions (Signed)
Continue Topamax at current doses does not need refills Continue Imitrex spray acutely will refill Follow-up in 6-8 months

## 2014-08-15 NOTE — Progress Notes (Signed)
[facility]  PATIENT: Heather [name] DOB: September 02, 1945   REASON FOR VISIT: Follow-up for migraines, obstructive sleep apnea and hypoxia at night  HISTORY FROM: Patient    HISTORY OF PRESENT ILLNESS:Heather [name] is a 69 year old right-handed black female with a history of migraine headache. The patient has done quite well on Topamax as a preventive and Imitrex spray acutely which she rarely uses. The patient indicates she has had 3 headaches since last seen by Heather [name] 02/12/2014. She has since gone back on the medication, but she is tolerating the medication well at this time. She has also been evaluated by Dr. Brett Fairy and has obstructive sleep apnea and uses BiPAP. 11 cm. The patient reports no other new medical issues that have come up since last seen.    REVIEW OF SYSTEMS: Full 14 system review of systems performed and notable only for those listed, all others are neg:  Constitutional: neg  Cardiovascular: neg Ear/Nose/Throat: neg  Skin: neg Eyes: neg Respiratory: neg Gastroitestinal: neg  Hematology/Lymphatic: neg  Endocrine: neg Musculoskeletal:neg Allergy/Immunology: neg Neurological: neg Psychiatric: neg Sleep : neg   ALLERGIES: Allergies  Allergen Reactions  . Aspirin   . Coconut Oil   . Latex   . Lisinopril     REACTION: cough  . Metoprolol Itching  . Peanut-Containing Drug Products   . Penicillins     REACTION: rash  . Prednisone   . Strawberry     HOME MEDICATIONS: Outpatient Prescriptions Prior to Visit  Medication Sig Dispense Refill  . acetaminophen (TYLENOL) 500 MG tablet Take 500 mg by mouth 3 (three) times daily as needed for pain.    . Calcium Carbonate-Vitamin D (CALCIUM 600 + D PO) Take 2 tablets by mouth daily.      . clopidogrel (PLAVIX) 75 MG tablet TAKE 1 TABLET EVERY DAY 90 tablet 3  . Ferrous Sulfate (IRON) 325 (65 FE) MG TABS Take 1 tablet by mouth daily.      . fexofenadine-pseudoephedrine (ALLEGRA-D ALLERGY  & CONGESTION) 180-240 MG per 24 hr tablet Take 1 tablet by mouth daily. 30 tablet 11  . furosemide (LASIX) 20 MG tablet TAKE 1 TABLET EVERY DAY 90 tablet 3  . glucosamine-chondroitin 500-400 MG tablet Take 1 tablet by mouth 2 (two) times daily.      [name] losartan (COZAAR) 25 MG tablet TAKE 1 TABLET EVERY DAY 90 tablet 3  . metFORMIN (GLUCOPHAGE-XR) 500 MG 24 hr tablet Take 2 tablets (1,000 mg total) by mouth 2 (two) times daily. (Patient taking differently: Take 1,000 mg by mouth daily. ) 360 tablet 3  . Multiple Vitamin (MULTIVITAMIN) tablet Take 1 tablet by mouth daily.      . Nutritional Supplements (PEPTAMEN 1.5 PO) Take by mouth 2 (two) times daily.    [name] omeprazole (PRILOSEC) 20 MG capsule Take 20 mg by mouth daily as needed.     . potassium chloride SA (K-DUR,KLOR-CON) 20 MEQ tablet Take 1 tablet (20 mEq total) by mouth daily. 90 tablet 3  . rosuvastatin (CRESTOR) 40 MG tablet TAKE 1 TABLET EVERY DAY 90 tablet 3  . SF 5000 PLUS 1.1 % CREA dental cream   0  . SUMAtriptan (IMITREX) 20 MG/ACT nasal spray Place 1 spray (20 mg total) into the nose every 2 (two) hours as needed for migraine. 12 Inhaler 1  . topiramate (TOPAMAX) 100 MG tablet Take 1 tablet (100 mg total) by mouth daily. 90 tablet 3  . vitamin E 200 UNIT capsule Take 200  Units by mouth daily.     No facility-administered medications prior to visit.    PAST MEDICAL HISTORY: Past Medical History  Diagnosis Date  . ALLERGIC RHINITIS 10/22/2006  . ANEMIA 12/18/2008  . ASYMPTOMATIC POSTMENOPAUSAL STATUS 11/22/2007  . DIABETES MELLITUS, TYPE II 01/13/2007  . GERD 07/20/2007  . GOITER, MULTINODULAR 07/20/2007  . Headache(784.0) 07/20/2007  . HEARING LOSS 11/22/2007  . HYPERCHOLESTEROLEMIA 01/13/2007  . HYPERTENSION 10/22/2006  . OSTEOARTHRITIS 10/22/2006  . Other chronic nonalcoholic liver disease 0/05/4915  . Hyperglycemia   . NASH (nonalcoholic steatohepatitis)   . Fibroid   . Hx of colposcopy with cervical biopsy   . Abnormal Pap  smear   . Complication of anesthesia   . Dyslipidemia   . Obesity   . Obstructive sleep apnea   . Degenerative arthritis   . Nocturnal hypoxemia 02/17/2013    PAST SURGICAL HISTORY: Past Surgical History  Procedure Laterality Date  . Esophagogastroduodenoscopy  12/08/2005  . Stress cardiolite  10/21/2005  . Electrocardiogram  10/15/2006  . Dexa  08/2005  . Wisdom tooth extraction    . Breast surgery      Breast reduction  . Sweat gland removal    . Dilation and curettage of uterus    . Eye surgery      FAMILY HISTORY: Family History  Problem Relation Age of Onset  . Cancer Mother     Breast Cancer, Colon Cancer  . Asthma Mother   . Depression Mother   . Bipolar disorder Mother   . Dementia Mother   . Arthritis Mother   . Hypertension Father   . Migraines Father   . Cancer Maternal Aunt   . Heart disease Maternal Grandmother   . Cancer Maternal Grandfather     SOCIAL HISTORY: History   Social History  . Marital Status: Married    Spouse Name: [name]  . Number of Children: 2  . Years of Education: College   Occupational History  . Retired    Social History Main Topics  . Smoking status: Former Smoker -- 2.00 packs/day for 20 years    Types: Cigarettes    Quit date: 03/03/1979  . Smokeless tobacco: Never Used  . Alcohol Use: No  . Drug Use: No  . Sexual Activity: Yes    Birth Control/ Protection: Surgical   Other Topics Concern  . Not on file   Social History Narrative   Patient is married [name]).   Patient has two children.   Patient is retired.   Patient has a college education.   Patient is right-handed.   Patient lives at home with family.   Caffeine Use: 2 soda every other day     PHYSICAL EXAM  Filed Vitals:   08/15/14 1509  BP: 110/69  Pulse: 96  Height: 5' 4.5" (1.638 m)  Weight: 276 lb 8 oz (125.42 kg)   Body mass index is 46.75 kg/(m^2). General: The patient is alert and cooperative at the time of the examination. The  patient is moderately to markedly obese. Skin: No significant peripheral edema is noted. Neurologic Exam Mental status: The patient is oriented x 3. Cranial nerves: Facial symmetry is present. Speech is normal, no aphasia or dysarthria is noted. Extraocular movements are full. Visual fields are full. Motor: The patient has good strength in all 4 extremities. Sensory examination: Soft touch sensation is symmetric on the face, arms, and legs. Coordination: The patient has good finger-nose-finger and heel-to-shin bilaterally. Gait and station: The patient has a  normal gait. Tandem gait is slightly unsteady. Romberg is negative. No drift is seen. Reflexes: Deep tendon reflexes are symmetric.   DIAGNOSTIC DATA (LABS, IMAGING, TESTING) - I reviewed patient records, labs, notes, testing and imaging myself where available.  Lab Results  Component Value Date   WBC 5.1 08/01/2014   HGB 12.4 08/01/2014   HCT 39.1 08/01/2014   MCV 86.7 08/01/2014   PLT 259.0 08/01/2014      Component Value Date/Time   NA 139 08/01/2014 1138   K 3.4* 08/01/2014 1138   CL 103 08/01/2014 1138   CO2 31 08/01/2014 1138   GLUCOSE 102* 08/01/2014 1138   BUN 12 08/01/2014 1138   CREATININE 0.91 08/01/2014 1138   CREATININE 0.81 12/28/2011 0853   CALCIUM 9.4 08/01/2014 1138   PROT 7.5 01/30/2014 1210   ALBUMIN 3.4* 01/30/2014 1210   AST 18 01/30/2014 1210   ALT 11 01/30/2014 1210   ALKPHOS 70 01/30/2014 1210   BILITOT 0.3 01/30/2014 1210   GFRNONAA 94.49 12/13/2009 0908   GFRAA 94 11/11/2007 0912   Lab Results  Component Value Date   CHOL 145 01/30/2014   HDL 49.90 01/30/2014   LDLCALC 64 01/30/2014   TRIG 155.0* 01/30/2014   CHOLHDL 3 01/30/2014   Lab Results  Component Value Date   HGBA1C 6.3 08/01/2014    Lab Results  Component Value Date   TSH 1.26 08/01/2014      ASSESSMENT AND PLAN  69 y.o. year old female  has a past medical history of  1. Migraine headache doing well on  Topamax 2. Obstructive sleep apnea with BiPAP 11 cm  Continue Topamax at current doses does not need refills Continue Imitrex spray acutely will refill Follow-up in 6-8 months Dennie Bible, [facility], [facility], APRN  [facility] [address] [place], [place] [address] [phone]

## 2014-08-15 NOTE — Progress Notes (Signed)
I have read the note, and I agree with the clinical assessment and plan.  Lyonel Morejon KEITH   

## 2014-08-21 ENCOUNTER — Ambulatory Visit: Payer: Medicare Other | Attending: Endocrinology | Admitting: Physical Therapy

## 2014-08-21 DIAGNOSIS — R531 Weakness: Secondary | ICD-10-CM | POA: Diagnosis not present

## 2014-08-21 DIAGNOSIS — M25561 Pain in right knee: Secondary | ICD-10-CM | POA: Insufficient documentation

## 2014-08-21 DIAGNOSIS — R262 Difficulty in walking, not elsewhere classified: Secondary | ICD-10-CM | POA: Diagnosis not present

## 2014-08-21 DIAGNOSIS — G8929 Other chronic pain: Secondary | ICD-10-CM | POA: Diagnosis not present

## 2014-08-21 NOTE — Patient Instructions (Signed)
Posture Tips DO: - stand tall and erect - keep chin tucked in - keep head and shoulders in alignment - check posture regularly in mirror or large window - pull head back against headrest in car seat;  Change your position often.  Sit with lumbar support. DON'T: - slouch or slump while watching TV or reading - sit, stand or lie in one position  for too long;  Sitting is especially hard on the spine so if you sit at a desk/use the computer, then stand up often!   Copyright  VHI. All rights reserved.  Posture - Standing   Good posture is important. Avoid slouching and forward head thrust. Maintain curve in low back and align ears over shoul- ders, hips over ankles.  Pull your belly button in toward your back bone. Even weight in toes and heels.  Golden thread to ceiling from chest.  Lift rib cage up , not [employer].  Remember to chin tuck   Copyright  VHI. All rights reserved.  Posture - Sitting   Sit upright, head facing forward. Try using a roll to support lower back. Keep shoulders relaxed, and avoid rounded back. Keep hips level with knees. Avoid crossing legs for long periods.  Sit on sit bones , not your tailbone as shown in clinic.  Pt /husband given Knee Level 1 exercise and straight leg raise 3 x 10 reps, ( Quad set, heel slide and SAQ)  Copyright  VHI. All rights reserved.   Voncille Lo, PT 08/21/2014 11:57 AM Phone: [phone] Fax: [phone]

## 2014-08-21 NOTE — Therapy (Signed)
[place], [place], [address] Phone: [phone]   Fax:  [phone]  Physical Therapy Evaluation  Patient Details  Name: Heather [name] MRN: [id number] Date of Birth: 1945-12-18 Referring Provider:  [name], MD  Encounter Date: 08/21/2014      PT End of Session - 08/21/14 1223    Visit Number 1   Number of Visits 16   Date for PT Re-Evaluation 10/16/14   Authorization Type Medicare   Authorization Time Period 10-16-14   PT Start Time 1102   PT Stop Time 1203   PT Time Calculation (min) 61 min   Activity Tolerance Patient tolerated treatment well   Behavior During Therapy [facility] for tasks assessed/performed      Past Medical History  Diagnosis Date  . ALLERGIC RHINITIS 10/22/2006  . ANEMIA 12/18/2008  . ASYMPTOMATIC POSTMENOPAUSAL STATUS 11/22/2007  . DIABETES MELLITUS, TYPE II 01/13/2007  . GERD 07/20/2007  . GOITER, MULTINODULAR 07/20/2007  . Headache(784.0) 07/20/2007  . HEARING LOSS 11/22/2007  . HYPERCHOLESTEROLEMIA 01/13/2007  . HYPERTENSION 10/22/2006  . OSTEOARTHRITIS 10/22/2006  . Other chronic nonalcoholic liver disease 06/28/7679  . Hyperglycemia   . NASH (nonalcoholic steatohepatitis)   . Fibroid   . Hx of colposcopy with cervical biopsy   . Abnormal Pap smear   . Complication of anesthesia   . Dyslipidemia   . Obesity   . Obstructive sleep apnea   . Degenerative arthritis   . Nocturnal hypoxemia 02/17/2013    Past Surgical History  Procedure Laterality Date  . Esophagogastroduodenoscopy  12/08/2005  . Stress cardiolite  10/21/2005  . Electrocardiogram  10/15/2006  . Dexa  08/2005  . Wisdom tooth extraction    . Breast surgery      Breast reduction  . Sweat gland removal    . Dilation and curettage of uterus    . Eye surgery      There were no vitals filed for this visit.  Visit Diagnosis:  Generalized weakness  Recurrent knee pain, right  Difficulty  walking  Knee pain, chronic, right      Subjective Assessment - 08/21/14 1108    Subjective I have had pain in R ight knee for 11 years but exacerbating pain in last 6 months.    Limitations Walking;Standing;House hold activities   How long can you sit comfortably? 5 min   How long can you stand comfortably? 5 mi  without cane   How long can you walk comfortably? --  without cane   Currently in Pain? Yes   Pain Score 6   sometimes 10/10   Pain Location Knee   Pain Orientation Right   Pain Descriptors / Indicators Aching;Tightness   Pain Type Chronic pain   Pain Onset More than a month ago   Pain Frequency Intermittent   Aggravating Factors  driving especially braking with R knee,stairs, one leg at a time, in and out of the car , must use a raised toilet and must use hands, difficult at movies ADLs socks difficulkty   Pain Relieving Factors medication,             [facility] PT Assessment - 08/21/14 1110    Assessment   Medical Diagnosis R knee pain, lower leg pain   Onset Date/Surgical Date 01/20/14   Hand Dominance Right   Prior Therapy 2 years ago   Precautions   Precautions None   Restrictions   Weight Bearing Restrictions No   Balance Screen  Has the patient fallen in the past 6 months No   Has the patient had a decrease in activity level because of a fear of falling?  No   Is the patient reluctant to leave their home because of a fear of falling?  No   Home Environment   Living Environment Private residence   Living Arrangements Spouse/significant other   Type of [place] to enter   Entrance Stairs-Number of Steps 4   Entrance Stairs-Rails Can reach both   [place] Two level   Prior Function   Level of Independence Independent with community mobility with device   Vocation Retired   Associate Professor   Overall Cognitive Status Within Functional Limits for tasks assessed   Observation/Other Assessments   Observations Pt sits with flexed and  sacral sitting,  Pt must use hands and blocking knees in order to rise from chair   Focus on Therapeutic Outcomes (FOTO)  Intake 26% limitation 74% predicted 54%   Posture/Postural Control   Posture/Postural Control Postural limitations   Postural Limitations Rounded Shoulders;Forward head;Increased lumbar lordosis;Increased thoracic kyphosis;Flexed trunk   Posture Comments Pt spends time in recliner    AROM   Right Knee Extension 0   Right Knee Flexion 91  very painful   Left Knee Extension 0   Left Knee Flexion 112   Strength   Overall Strength Deficits   Overall Strength Comments grossly deconditioned 3/5 to 4-/5   Right Hip Flexion 3-/5   Right Hip ABduction 3-/5   Left Hip Flexion 4-/5   Left Hip ABduction 3-/5   Right Knee Flexion 3/5   Right Knee Extension 3-/5   Left Knee Flexion 4-/5   Left Knee Extension 4-/5   Flexibility   Hamstrings greatly restricted Right 45 degrees, Left 55   Palpation   Patella mobility decreased patellar mobility to medial restricted laterally on Right   Palpation comment tenderness medial patellar tendon below patella   Transfers   Transfers Sit to Stand  must use compensatory movements to rise from sitting.    Ambulation/Gait   Ambulation/Gait Yes   Ambulation/Gait Assistance 6: Modified independent (Device/Increase time)   Ambulation Distance (Feet) 100 Feet   Assistive device --  Hurry cane 3 pt SPC   Gait Pattern Wide base of support   Ambulation Surface Level   Gait velocity 2.01 ft/sec with SPC                   [facility] Adult PT Treatment/Exercise - 08/21/14 1110    Knee/Hip Exercises: Supine   Quad Sets 10 reps;2 sets;Strengthening;Right   Quad Sets Limitations with towel roll   Short Arc Quad Sets Right;10 reps   Short Arc Quad Sets Limitations pain with end range extension   Heel Slides Strengthening;1 set;10 reps   Heel Slides Limitations weakness   Straight Leg Raises 2 sets;Strengthening;10 reps                 PT Education - 08/21/14 1140    Education provided Yes   Education Details Pt given initial HEP for knee level 1 and SLR and posture for sitting andstanding   Person(s) Educated Patient;Spouse   Methods Explanation;Demonstration;Handout;Tactile cues;Verbal cues   Comprehension Verbalized understanding;Returned demonstration;Verbal cues required;Need further instruction          PT Short Term Goals - 08/21/14 1232    PT SHORT TERM GOAL #1   Title independent with initial HEP  Time 4   Period Weeks   Status New   PT SHORT TERM GOAL #2   Title Report pain decrease at rest from 6/10 to 3/10   Time 4   Period Weeks   Status New   PT SHORT TERM GOAL #3   Title Demonstrate and verbalize understanding of condition management including RICE, positioning, use of AD, HEP   Time 4   Period Weeks   Status New   PT SHORT TERM GOAL #4   Title "Demonstrate understanding of proper sitting posture and be more conscious of position and posture throughout the day.    Time 4   Period Weeks   Status New           PT Long Term Goals - 08/21/14 1234    PT LONG TERM GOAL #1   Title "Pt will be independent with advanced HEP   Time 8   Period Weeks   Status New   PT LONG TERM GOAL #2   Title "FOTO will improve from 74% limitation  to  54% indicating improved functional mobility   Time 8   Period Weeks   Status New   PT LONG TERM GOAL #3   Title "R knee AAROM flexion will improve to 0-115 degrees for improved mobility to ride in car without increased pain   Time 8   Period Weeks   Status New   PT LONG TERM GOAL #4   Title Pt will increase R hip and knee AROM in order to don socks with pain level 2/10 or less   Time 8   Period Weeks   Status New   PT LONG TERM GOAL #5   Title Pt will demonstrate sit to stand form chair without using compensatory motions(blocking knee, momentum) x 5   Time 8   Period Weeks   Status New               Plan - 08/21/14  1210    Clinical Impression Statement 69 yo pt accompanied by husband and using a hurry cane and definite need for cane and definite need for hands in order to rise from sitting.  Pt blocks knees against stationery chair demonstrating Core weakness and copensatory motions.  Pt also has severely limited hamstring flexibility largely due toimmobility and sitting inrecliner at home.  Pt/husband  educated on sitting andstanding posture and need for  exercise/strength/ mobility through out day.  Pt presents with 11 years of knee pain with recent exacerbation in last 6 months.  Pt presents with impairments inclluding pain, knee weakness, generalized core weakness, impaired ROM, difficulty walkign, stairs and transfers.  Pt would benefit from skilled PT for 2 x  week for 8 weeks to address impairments and functinal limitation ( unable to dress Peggyann Juba) anad return to reduced pain defict.  Eval  pain  6/10 today but with right knee flex 10/10   Pt will benefit from skilled therapeutic intervention in order to improve on the following deficits Abnormal gait;Decreased activity tolerance;Decreased mobility;Decreased endurance;Decreased range of motion;Decreased strength;Increased fascial restricitons;Impaired flexibility;Postural dysfunction;Improper body mechanics;Obesity;Pain   Rehab Potential Good   PT Frequency 2x / week   PT Duration 8 weeks   PT Treatment/Interventions ADLs/Self Care Home Management;Cryotherapy;Electrical Stimulation;Stair training;Gait training;Ultrasound;Moist Heat;Iontophoresis 46m/ml Dexamethasone;Functional mobility training;Therapeutic exercise;Neuromuscular re-education;Manual techniques;Patient/family education;Passive range of motion;Dry needling;Taping   PT Next Visit Plan Hamstring flexibiilty and modalities, soft tissue to R quad,  begin core strength for increase ability to rise from chair without  compensatory motions   PT Home Exercise Plan knee level 1 quad set, heel slide, SAQ and  SLR and posture/sitting and standing   Consulted and Agree with Plan of Care Patient          G-Codes - 09/04/14 1242    Functional Assessment Tool Used FOTO   Functional Limitation Mobility: Walking and moving around   Mobility: Walking and Moving Around Current Status [phone]) At least 60 percent but less than 80 percent impaired, limited or restricted   Mobility: Walking and Moving Around Goal Status [phone]) At least 40 percent but less than 60 percent impaired, limited or restricted       Problem List Patient Active Problem List   Diagnosis Date Noted  . Loss of weight 08/01/2014  . Coronary atherosclerosis of native coronary artery 10/31/2013  . Incidental lung nodule, > 75m and < 819m Left lower lobe 35m9m June 2014, April 2015 unchanged 06/25/2013  . OSA on CPAP 06/21/2013  . 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 Myalgia and myositis 05/02/2013  . Muscle ache 04/03/2013  . Nocturnal hypoxemia 02/17/2013  . Morbid obesity 01/24/2013  . Hiatal hernia seen on CT 08/24/2012  .  LLL nodule 6mm29mJune 2014, described in 2003 07/13/2012  . Asthma, chronic 06/29/2012  . Arthralgia 06/02/2012  . Unspecified asthma(493.90) 06/02/2012  . Hypersomnia with sleep apnea, unspecified 03/11/2012  . Other alteration of consciousness 03/11/2012  . Migraine without aura 03/11/2012  . Encounter for long-term (current) use of other medications 12/28/2011  . Dyspnea 12/28/2011  . Screening examination for infectious disease 12/28/2011  . Hx of fibroids 08/03/2011  . Knee pain, left 12/25/2010  . NECK PAIN 12/03/2009  . EDEMA 12/18/2008  . TINNITUS 11/22/2007  . HEARING LOSS 11/22/2007  . KNEE PAIN, RIGHT 11/22/2007  . ASYMPTOMATIC POSTMENOPAUSAL STATUS 11/22/2007  . GOITER, MULTINODULAR 07/20/2007  . GERD 07/20/2007  . NASH (nonalcoholic steatohepatitis) 07/20/2007  . HEADACHE 07/20/2007  . DM (diabetes mellitus), type 2 01/13/2007  . HYPERCHOLESTEROLEMIA 01/13/2007  . ANXIETY  10/22/2006  . Essential hypertension 10/22/2006  . ALLERGIC RHINITIS 10/22/2006  . OSTEOARTHRITIS 10/22/2006    [name] 06/22016/08/542 PM Phone: [phone]: [address] [place], [place]: [phone]:  [phone]

## 2014-08-23 ENCOUNTER — Ambulatory Visit (HOSPITAL_COMMUNITY)
Admission: RE | Admit: 2014-08-23 | Discharge: 2014-08-23 | Disposition: A | Discharge status: Home / Self Care | Payer: Medicare Other | Source: Ambulatory Visit | Attending: Cardiology | Admitting: Cardiology

## 2014-08-23 ENCOUNTER — Encounter (HOSPITAL_COMMUNITY): Payer: Self-pay

## 2014-08-23 VITALS — BP 124/66 | HR 83 | Ht 64.5 in | Wt 276.4 lb

## 2014-08-23 DIAGNOSIS — Z79899 Other long term (current) drug therapy: Secondary | ICD-10-CM | POA: Diagnosis not present

## 2014-08-23 DIAGNOSIS — G4733 Obstructive sleep apnea (adult) (pediatric): Secondary | ICD-10-CM | POA: Diagnosis not present

## 2014-08-23 DIAGNOSIS — Z6841 Body Mass Index (BMI) 40.0 and over, adult: Secondary | ICD-10-CM | POA: Diagnosis not present

## 2014-08-23 DIAGNOSIS — K219 Gastro-esophageal reflux disease without esophagitis: Secondary | ICD-10-CM | POA: Diagnosis not present

## 2014-08-23 DIAGNOSIS — K449 Diaphragmatic hernia without obstruction or gangrene: Secondary | ICD-10-CM | POA: Diagnosis not present

## 2014-08-23 DIAGNOSIS — Z87891 Personal history of nicotine dependence: Secondary | ICD-10-CM | POA: Insufficient documentation

## 2014-08-23 DIAGNOSIS — I2510 Atherosclerotic heart disease of native coronary artery without angina pectoris: Secondary | ICD-10-CM | POA: Insufficient documentation

## 2014-08-23 DIAGNOSIS — E119 Type 2 diabetes mellitus without complications: Secondary | ICD-10-CM | POA: Diagnosis not present

## 2014-08-23 DIAGNOSIS — E785 Hyperlipidemia, unspecified: Secondary | ICD-10-CM | POA: Diagnosis not present

## 2014-08-23 DIAGNOSIS — I10 Essential (primary) hypertension: Secondary | ICD-10-CM | POA: Insufficient documentation

## 2014-08-23 DIAGNOSIS — Z7902 Long term (current) use of antithrombotics/antiplatelets: Secondary | ICD-10-CM | POA: Diagnosis not present

## 2014-08-23 NOTE — Patient Instructions (Signed)
Follow up 1 year.  Do the following things EVERYDAY: 1) Weigh yourself in the morning before breakfast. Write it down and keep it in a log. 2) Take your medicines as prescribed 3) Eat low salt foods-Limit salt (sodium) to 2000 mg per day.  4) Stay as active as you can everyday 5) Limit all fluids for the day to less than 2 liters

## 2014-08-23 NOTE — Progress Notes (Signed)
Patient ID: Heather [name], female   DOB: [birth year], 69 y.o.   MRN: [id number] PCP: Dr. Loanne Drilling  69 yo with CAD by coronary calcium on CT chest, OSA on Bipap, type II diabetes, and HTN presents for cardiology evaluation.  Her husband Dorise Gangi) is a patient of mine as well.  She had a workup for exertional dyspnea in 6/14 by Dr Chase Caller.  PFTs were restrictive with normal DLCO, suggesting abnormality was from body habitus.  CT chest showed coronary calcification.  She had an echo showed normal EF and a Lexiscan Cardiolite in 8/14 that was normal.    Recently, she has not had significant exertional dyspnea.  She is more limited by knee pain than anything else.  She is going to start PT.  She can walk about 2 blocks on flat ground before stopping.  Knees bother her a lot with inclines.  Her weight is stable but she is morbidly obese.  She does not watch what she is eating and actually eats a lot of ice cream.  No chest pain. No orthopnea/PND.  Some generalized fatigue.  She cannot take ASA due to hives so is taking Plavix.   ECG: NSR, normal  Labs (12/15): LDL 64, HDL 50 Labs (6/16): TSH normal, HCT 39.1, K 3.4, creatinine 0.91  PMH: 1. Obese 2. OSA: On oxygen and Bipap at night.  3. Migraines 4. GERD with large hiatal hernia 5. Type II diabetes. 6. PFTs in 6/14 with evidence for restriction but normal DLCO.  Suspect related to body habitus.  7. CAD: Coronary calcium on CT chest in 6/14.  Lexiscan Cardiolite in 8/14 with EF 51%, no ischemia or infarction.  Echo (8/14) with EF 60%, mild LVH.  8. Hyperlipidemia 9. HTN 10. Uterine fibroids 11. Osteoarthritis 12. Aspirin allergy: Hives.   SH: Married, quit smoking in 1980s, 2 children  FH: No premature CAD.  ROS: All systems reviewed and negative except as per HPI.   Current Outpatient Prescriptions  Medication Sig Dispense Refill  . acetaminophen (TYLENOL) 500 MG tablet Take 500 mg by mouth 3 (three) times daily as needed for  pain.    . Calcium Carbonate-Vitamin D (CALCIUM 600 + D PO) Take 2 tablets by mouth daily.      . clopidogrel (PLAVIX) 75 MG tablet TAKE 1 TABLET EVERY DAY 90 tablet 3  . Ferrous Sulfate (IRON) 325 (65 FE) MG TABS Take 1 tablet by mouth daily.      . fexofenadine-pseudoephedrine (ALLEGRA-D ALLERGY & CONGESTION) 180-240 MG per 24 hr tablet Take 1 tablet by mouth daily. 30 tablet 11  . furosemide (LASIX) 20 MG tablet TAKE 1 TABLET EVERY DAY 90 tablet 3  . glucosamine-chondroitin 500-400 MG tablet Take 1 tablet by mouth 2 (two) times daily.      [name] losartan (COZAAR) 25 MG tablet TAKE 1 TABLET EVERY DAY 90 tablet 3  . metFORMIN (GLUCOPHAGE-XR) 500 MG 24 hr tablet Take 2 tablets (1,000 mg total) by mouth 2 (two) times daily. (Patient taking differently: Take 1,000 mg by mouth daily. ) 360 tablet 3  . Multiple Vitamin (MULTIVITAMIN) tablet Take 1 tablet by mouth daily.      . Nutritional Supplements (PEPTAMEN 1.5 PO) Take by mouth 2 (two) times daily.    [name] omeprazole (PRILOSEC) 20 MG capsule Take 20 mg by mouth daily as needed.     . potassium chloride SA (K-DUR,KLOR-CON) 20 MEQ tablet Take 1 tablet (20 mEq total) by mouth daily. 90 tablet 3  .  rosuvastatin (CRESTOR) 40 MG tablet TAKE 1 TABLET EVERY DAY 90 tablet 3  . SF 5000 PLUS 1.1 % CREA dental cream   0  . SUMAtriptan (IMITREX) 20 MG/ACT nasal spray Place 1 spray (20 mg total) into the nose every 2 (two) hours as needed for migraine. 12 Inhaler 1  . topiramate (TOPAMAX) 100 MG tablet Take 1 tablet (100 mg total) by mouth daily. 90 tablet 3  . vitamin E 200 UNIT capsule Take 200 Units by mouth daily.     No current facility-administered medications for this encounter.   BP 124/66 mmHg  Pulse 83  Ht 5' 4.5" (1.638 m)  Wt 276 lb 6.4 oz (125.374 kg)  BMI 46.73 kg/m2  SpO2 97% General: NAD, obese Neck: No JVD, no thyromegaly or thyroid nodule.  Lungs: Clear to auscultation bilaterally with normal respiratory effort. CV: Nondisplaced PMI.   Heart regular S1/S2, no S3/S4, no murmur.  1+ ankle edema.  No carotid bruit.  Normal pedal pulses.  Abdomen: Soft, nontender, no hepatosplenomegaly, no distention.  Skin: Intact without lesions or rashes.  Neurologic: Alert and oriented x 3.  Psych: Normal affect. Extremities: No clubbing or cyanosis.  HEENT: Normal.   Assessment/Plan: 1. CAD: Patient has coronary disease based on calcium seen on chest CT in the coronaries in 6/14.  She had a Lexiscan Cardiolite in 8/14 that was normal.  Currently, she is really not limited by dyspnea and has no chest pain.  Main problem is bilateral knee pain with ambulation.   - Continue Plavix 75 daily (ASA allergy).  - Continue statin and ARB.  2. Hyperlipidemia: Good lipids in 12/15 on Crestor.  3. HTN: BP is controlled.  4. Obesity: Needs to work on weight loss.  Cut back a lot of sweets/ice cream.  Needs to be more active.  I suggested that she walk in a pool to limit strain on her knee joints.    [name] 08/23/2014

## 2014-08-27 ENCOUNTER — Encounter: Payer: Self-pay | Admitting: Endocrinology

## 2014-09-11 ENCOUNTER — Ambulatory Visit: Payer: Medicare Other | Attending: Endocrinology | Admitting: Physical Therapy

## 2014-09-11 DIAGNOSIS — R262 Difficulty in walking, not elsewhere classified: Secondary | ICD-10-CM | POA: Insufficient documentation

## 2014-09-11 DIAGNOSIS — R531 Weakness: Secondary | ICD-10-CM | POA: Diagnosis not present

## 2014-09-11 DIAGNOSIS — G8929 Other chronic pain: Secondary | ICD-10-CM | POA: Diagnosis not present

## 2014-09-11 DIAGNOSIS — M25561 Pain in right knee: Secondary | ICD-10-CM

## 2014-09-12 NOTE — Therapy (Signed)
[place], [place], [address] Phone: [phone]   Fax:  [phone]  Physical Therapy Treatment  Patient Details  Name: Heather [name] MRN: [id number] Date of Birth: 04/25/1945 Referring Provider:  [name], MD  Encounter Date: 09/11/2014      PT End of Session - 09/11/14 1502    Visit Number 2   Number of Visits 16   Date for PT Re-Evaluation 10/16/14   PT Start Time 5397   PT Stop Time 1503   PT Time Calculation (min) 43 min   Activity Tolerance Patient tolerated treatment well;Patient limited by pain   Behavior During Therapy [facility] for tasks assessed/performed      Past Medical History  Diagnosis Date  . ALLERGIC RHINITIS 10/22/2006  . ANEMIA 12/18/2008  . ASYMPTOMATIC POSTMENOPAUSAL STATUS 11/22/2007  . DIABETES MELLITUS, TYPE II 01/13/2007  . GERD 07/20/2007  . GOITER, MULTINODULAR 07/20/2007  . Headache(784.0) 07/20/2007  . HEARING LOSS 11/22/2007  . HYPERCHOLESTEROLEMIA 01/13/2007  . HYPERTENSION 10/22/2006  . OSTEOARTHRITIS 10/22/2006  . Other chronic nonalcoholic liver disease 6/73/4193  . Hyperglycemia   . NASH (nonalcoholic steatohepatitis)   . Fibroid   . Hx of colposcopy with cervical biopsy   . Abnormal Pap smear   . Complication of anesthesia   . Dyslipidemia   . Obesity   . Obstructive sleep apnea   . Degenerative arthritis   . Nocturnal hypoxemia 02/17/2013    Past Surgical History  Procedure Laterality Date  . Esophagogastroduodenoscopy  12/08/2005  . Stress cardiolite  10/21/2005  . Electrocardiogram  10/15/2006  . Dexa  08/2005  . Wisdom tooth extraction    . Breast surgery      Breast reduction  . Sweat gland removal    . Dilation and curettage of uterus    . Eye surgery      There were no vitals filed for this visit.  Visit Diagnosis:  Recurrent knee pain, right  Generalized weakness      Subjective Assessment - 09/11/14 1430    Subjective Flare of  pain.  Nothing special happened.  tired tummeric, extra rest , heat .Was sleeping with flannel PJ's to keep it warm."  Has an appointment with [facility] .[name].." brand new office on [place].    Has good feet orthotics.     Pain Relieving Factors keepin rt leg straight wit sitting helpful, tylenol. cane helps.                         [facility] Adult PT Treatment/Exercise - 09/11/14 1439    Self-Care   Self-Care --  Knee arthritis, shoes, anatomy, and other questions answered   Knee/Hip Exercises: Seated   Heel Slides 10 reps  pillow case.   Knee/Hip Exercises: Supine   Quad Sets 10 reps  cues   Short Arc Quad Sets Right;10 reps;1 set   Short Arc Quad Sets Limitations no pain   Heel Slides Strengthening   Heel Slides Limitations 1 rep- painful   Straight Leg Raises Strengthening  cues, 5 reps   Moist Heat Therapy   Number Minutes Moist Heat 15 Minutes   Moist Heat Location Knee   Manual Therapy   Manual therapy comments Rock tool used for soft tissue work RT leg                PT Education - 09/11/14 1822    Education provided Yes   Education Details  self care, and home ex   Person(s) Educated Patient   Methods Explanation;Demonstration;Verbal cues;Handout   Comprehension Verbalized understanding;Returned demonstration          PT Short Term Goals - 08/21/14 1232    PT SHORT TERM GOAL #1   Title independent with initial HEP   Time 4   Period Weeks   Status New   PT SHORT TERM GOAL #2   Title Report pain decrease at rest from 6/10 to 3/10   Time 4   Period Weeks   Status New   PT SHORT TERM GOAL #3   Title Demonstrate and verbalize understanding of condition management including RICE, positioning, use of AD, HEP   Time 4   Period Weeks   Status New   PT SHORT TERM GOAL #4   Title "Demonstrate understanding of proper sitting posture and be more conscious of position and posture throughout the day.    Time 4   Period Weeks   Status New            PT Long Term Goals - 08/21/14 1234    PT LONG TERM GOAL #1   Title "Pt will be independent with advanced HEP   Time 8   Period Weeks   Status New   PT LONG TERM GOAL #2   Title "FOTO will improve from 74% limitation  to  54% indicating improved functional mobility   Time 8   Period Weeks   Status New   PT LONG TERM GOAL #3   Title "R knee AAROM flexion will improve to 0-115 degrees for improved mobility to ride in car without increased pain   Time 8   Period Weeks   Status New   PT LONG TERM GOAL #4   Title Pt will increase R hip and knee AROM in order to don socks with pain level 2/10 or less   Time 8   Period Weeks   Status New   PT LONG TERM GOAL #5   Title Pt will demonstrate sit to stand form chair without using compensatory motions(blocking knee, momentum) x 5   Time 8   Period Weeks   Status New               Plan - 09/11/14 1822    Clinical Impression Statement Pain less post session.  Extra time taken to fully explain and practice and educate today.  Less pain post session.   PT Next Visit Plan Hamstring flexibiilty and modalities, soft tissue to R quad,  begin core strength for increase ability to rise from chair without compensatory motions        Problem List Patient Active Problem List   Diagnosis Date Noted  . Loss of weight 08/01/2014  . Coronary atherosclerosis of native coronary artery 10/31/2013  . Incidental lung nodule, > 72m and < 819m Left lower lobe 1m35m June 2014, April 2015 unchanged 06/25/2013  . OSA on CPAP 06/21/2013  . 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 Myalgia and myositis 05/02/2013  . Muscle ache 04/03/2013  . Nocturnal hypoxemia 02/17/2013  . Morbid obesity 01/24/2013  . Hiatal hernia seen on CT 08/24/2012  .  LLL nodule 6mm44mJune 2014, described in 2003 07/13/2012  . Asthma, chronic 06/29/2012  . Arthralgia 06/02/2012  . Unspecified asthma(493.90) 06/02/2012  . Hypersomnia with sleep apnea, unspecified  03/11/2012  . Other alteration of consciousness 03/11/2012  . Migraine without aura 03/11/2012  . Encounter for long-term (current) use of  other medications 12/28/2011  . Dyspnea 12/28/2011  . Screening examination for infectious disease 12/28/2011  . Hx of fibroids 08/03/2011  . Knee pain, left 12/25/2010  . NECK PAIN 12/03/2009  . EDEMA 12/18/2008  . TINNITUS 11/22/2007  . HEARING LOSS 11/22/2007  . KNEE PAIN, RIGHT 11/22/2007  . ASYMPTOMATIC POSTMENOPAUSAL STATUS 11/22/2007  . GOITER, MULTINODULAR 07/20/2007  . GERD 07/20/2007  . NASH (nonalcoholic steatohepatitis) 07/20/2007  . HEADACHE 07/20/2007  . DM (diabetes mellitus), type 2 01/13/2007  . HYPERCHOLESTEROLEMIA 01/13/2007  . ANXIETY 10/22/2006  . Essential hypertension 10/22/2006  . ALLERGIC RHINITIS 10/22/2006  . OSTEOARTHRITIS 10/22/2006    HARRIS,KAREN 09/12/2014, 1:05 PM  [facility] [address] [place], [place], [address] Phone: [phone]   Fax:  [phone]     Melvenia Needles, PTA 09/12/2014 1:05 PM Phone: [phone] Fax: [phone]

## 2014-09-13 ENCOUNTER — Ambulatory Visit: Payer: Medicare Other | Admitting: Physical Therapy

## 2014-09-13 DIAGNOSIS — M25561 Pain in right knee: Secondary | ICD-10-CM

## 2014-09-13 DIAGNOSIS — G8929 Other chronic pain: Secondary | ICD-10-CM | POA: Diagnosis not present

## 2014-09-13 DIAGNOSIS — R531 Weakness: Secondary | ICD-10-CM | POA: Diagnosis not present

## 2014-09-13 DIAGNOSIS — R262 Difficulty in walking, not elsewhere classified: Secondary | ICD-10-CM | POA: Diagnosis not present

## 2014-09-13 NOTE — Therapy (Signed)
[place], [place], [address] Phone: [phone]   Fax:  [phone]  Physical Therapy Treatment  Patient Details  Name: Heather [name] MRN: [id number] Date of Birth: 1945/06/17 Referring Provider:  [name], MD  Encounter Date: 09/13/2014      PT End of Session - 09/13/14 1610    Visit Number 3   Number of Visits 16   Date for PT Re-Evaluation 10/16/14   PT Start Time 9622   PT Stop Time 1520   PT Time Calculation (min) 65 min   Activity Tolerance Patient tolerated treatment well   Behavior During Therapy [facility] for tasks assessed/performed      Past Medical History  Diagnosis Date  . ALLERGIC RHINITIS 10/22/2006  . ANEMIA 12/18/2008  . ASYMPTOMATIC POSTMENOPAUSAL STATUS 11/22/2007  . DIABETES MELLITUS, TYPE II 01/13/2007  . GERD 07/20/2007  . GOITER, MULTINODULAR 07/20/2007  . Headache(784.0) 07/20/2007  . HEARING LOSS 11/22/2007  . HYPERCHOLESTEROLEMIA 01/13/2007  . HYPERTENSION 10/22/2006  . OSTEOARTHRITIS 10/22/2006  . Other chronic nonalcoholic liver disease 2/97/9892  . Hyperglycemia   . NASH (nonalcoholic steatohepatitis)   . Fibroid   . Hx of colposcopy with cervical biopsy   . Abnormal Pap smear   . Complication of anesthesia   . Dyslipidemia   . Obesity   . Obstructive sleep apnea   . Degenerative arthritis   . Nocturnal hypoxemia 02/17/2013    Past Surgical History  Procedure Laterality Date  . Esophagogastroduodenoscopy  12/08/2005  . Stress cardiolite  10/21/2005  . Electrocardiogram  10/15/2006  . Dexa  08/2005  . Wisdom tooth extraction    . Breast surgery      Breast reduction  . Sweat gland removal    . Dilation and curettage of uterus    . Eye surgery      There were no vitals filed for this visit.  Visit Diagnosis:  Generalized weakness  Recurrent knee pain, right  Difficulty walking      Subjective Assessment - 09/13/14 1423    Subjective Did her nee  flexion on bed, in kitchen chair and on the couch.   Last session helped a little while.  I worked my exercises into my day.  Sore hamstrings and Lateral Rt   Currently in Pain? Yes   Pain Score --  mildk   Pain Orientation Right   Pain Descriptors / Indicators Aching;Sore   Pain Radiating Towards N/A                         [facility] Adult PT Treatment/Exercise - 09/13/14 1420    Knee/Hip Exercises: Stretches   Passive Hamstring Stretch 3 reps;30 seconds  sitting works best.  Supine, leg too heavy for patient to do   ITB Stretch --  1 rep PROM very light pressure.   Knee/Hip Exercises: Seated   Heel Slides 10 reps  eack   Knee/Hip Exercises: Supine   Heel Slides Strengthening   Heel Slides Limitations 5 reps AA ROM  81 degrees flexion, supine.   Moist Heat Therapy   Number Minutes Moist Heat 15 Minutes   Moist Heat Location Knee  both   Manual Therapy   Manual therapy comments rock tool for distal quad and IT band insertion Rt. Then 2 Y's of kinesiotex taping i to activate quads , 1 to inhibit lower leg.  PT Education - 09/13/14 1608    Education provided Yes   Education Details hamstring stretches.    Person(s) Educated Patient   Methods Explanation;Demonstration;Tactile cues;Verbal cues;Handout   Comprehension Verbalized understanding;Returned demonstration          PT Short Term Goals - 08/21/14 1232    PT SHORT TERM GOAL #1   Title independent with initial HEP   Time 4   Period Weeks   Status New   PT SHORT TERM GOAL #2   Title Report pain decrease at rest from 6/10 to 3/10   Time 4   Period Weeks   Status New   PT SHORT TERM GOAL #3   Title Demonstrate and verbalize understanding of condition management including RICE, positioning, use of AD, HEP   Time 4   Period Weeks   Status New   PT SHORT TERM GOAL #4   Title "Demonstrate understanding of proper sitting posture and be more conscious of position and posture  throughout the day.    Time 4   Period Weeks   Status New           PT Long Term Goals - 08/21/14 1234    PT LONG TERM GOAL #1   Title "Pt will be independent with advanced HEP   Time 8   Period Weeks   Status New   PT LONG TERM GOAL #2   Title "FOTO will improve from 74% limitation  to  54% indicating improved functional mobility   Time 8   Period Weeks   Status New   PT LONG TERM GOAL #3   Title "R knee AAROM flexion will improve to 0-115 degrees for improved mobility to ride in car without increased pain   Time 8   Period Weeks   Status New   PT LONG TERM GOAL #4   Title Pt will increase R hip and knee AROM in order to don socks with pain level 2/10 or less   Time 8   Period Weeks   Status New   PT LONG TERM GOAL #5   Title Pt will demonstrate sit to stand form chair without using compensatory motions(blocking knee, momentum) x 5   Time 8   Period Weeks   Status New               Plan - 09/13/14 1611    Clinical Impression Statement Extra time required for all exercises and moving.   Progress toward home exercise goals.   PT Next Visit Plan review hamstring stretch, try standing calf stretch. mini squats?  Tape if patient thinks it helps.   Consulted and Agree with Plan of Care Patient        Problem List Patient Active Problem List   Diagnosis Date Noted  . Loss of weight 08/01/2014  . Coronary atherosclerosis of native coronary artery 10/31/2013  . Incidental lung nodule, > 71m and < 819m Left lower lobe 45m20m June 2014, April 2015 unchanged 06/25/2013  . OSA on CPAP 06/21/2013  . 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 Myalgia and myositis 05/02/2013  . Muscle ache 04/03/2013  . Nocturnal hypoxemia 02/17/2013  . Morbid obesity 01/24/2013  . Hiatal hernia seen on CT 08/24/2012  .  LLL nodule 6mm39mJune 2014, described in 2003 07/13/2012  . Asthma, chronic 06/29/2012  . Arthralgia 06/02/2012  . Unspecified asthma(493.90) 06/02/2012  .  Hypersomnia with sleep apnea, unspecified 03/11/2012  . Other alteration of consciousness 03/11/2012  . Migraine  without aura 03/11/2012  . Encounter for long-term (current) use of other medications 12/28/2011  . Dyspnea 12/28/2011  . Screening examination for infectious disease 12/28/2011  . Hx of fibroids 08/03/2011  . Knee pain, left 12/25/2010  . NECK PAIN 12/03/2009  . EDEMA 12/18/2008  . TINNITUS 11/22/2007  . HEARING LOSS 11/22/2007  . KNEE PAIN, RIGHT 11/22/2007  . ASYMPTOMATIC POSTMENOPAUSAL STATUS 11/22/2007  . GOITER, MULTINODULAR 07/20/2007  . GERD 07/20/2007  . NASH (nonalcoholic steatohepatitis) 07/20/2007  . HEADACHE 07/20/2007  . DM (diabetes mellitus), type 2 01/13/2007  . HYPERCHOLESTEROLEMIA 01/13/2007  . ANXIETY 10/22/2006  . Essential hypertension 10/22/2006  . ALLERGIC RHINITIS 10/22/2006  . OSTEOARTHRITIS 10/22/2006    HARRIS,KAREN 09/13/2014, 4:14 PM  [facility] [address] [place], [place], [address] Phone: [phone]   Fax:  [phone]  Melvenia Needles, PTA 09/13/2014 4:14 PM Phone: [phone] Fax: [phone]

## 2014-09-13 NOTE — Patient Instructions (Signed)
Remove tape if irritating 

## 2014-09-18 ENCOUNTER — Ambulatory Visit: Payer: Medicare Other | Admitting: Physical Therapy

## 2014-09-18 DIAGNOSIS — M25561 Pain in right knee: Secondary | ICD-10-CM | POA: Diagnosis not present

## 2014-09-18 DIAGNOSIS — G8929 Other chronic pain: Secondary | ICD-10-CM

## 2014-09-18 DIAGNOSIS — R262 Difficulty in walking, not elsewhere classified: Secondary | ICD-10-CM | POA: Diagnosis not present

## 2014-09-18 DIAGNOSIS — R531 Weakness: Secondary | ICD-10-CM | POA: Diagnosis not present

## 2014-09-18 NOTE — Therapy (Signed)
[place], [place], [address] Phone: [phone]   Fax:  [phone]  Physical Therapy Treatment  Patient Details  Name: Heather [name] MRN: [id number] Date of Birth: Sep 19, 1945 Referring Provider:  [name], MD  Encounter Date: 09/18/2014      PT End of Session - 09/18/14 1207    Visit Number 4   Number of Visits 16   Date for PT Re-Evaluation 10/16/14   Authorization Type Medicare   Authorization Time Period 10-16-14   PT Start Time 1100   PT Stop Time 1150   PT Time Calculation (min) 50 min   Activity Tolerance Patient tolerated treatment well   Behavior During Therapy [facility] for tasks assessed/performed      Past Medical History  Diagnosis Date  . ALLERGIC RHINITIS 10/22/2006  . ANEMIA 12/18/2008  . ASYMPTOMATIC POSTMENOPAUSAL STATUS 11/22/2007  . DIABETES MELLITUS, TYPE II 01/13/2007  . GERD 07/20/2007  . GOITER, MULTINODULAR 07/20/2007  . Headache(784.0) 07/20/2007  . HEARING LOSS 11/22/2007  . HYPERCHOLESTEROLEMIA 01/13/2007  . HYPERTENSION 10/22/2006  . OSTEOARTHRITIS 10/22/2006  . Other chronic nonalcoholic liver disease 2/35/5732  . Hyperglycemia   . NASH (nonalcoholic steatohepatitis)   . Fibroid   . Hx of colposcopy with cervical biopsy   . Abnormal Pap smear   . Complication of anesthesia   . Dyslipidemia   . Obesity   . Obstructive sleep apnea   . Degenerative arthritis   . Nocturnal hypoxemia 02/17/2013    Past Surgical History  Procedure Laterality Date  . Esophagogastroduodenoscopy  12/08/2005  . Stress cardiolite  10/21/2005  . Electrocardiogram  10/15/2006  . Dexa  08/2005  . Wisdom tooth extraction    . Breast surgery      Breast reduction  . Sweat gland removal    . Dilation and curettage of uterus    . Eye surgery      There were no vitals filed for this visit.  Visit Diagnosis:  Generalized weakness  Recurrent knee pain, right  Difficulty  walking  Knee pain, chronic, right      Subjective Assessment - 09/18/14 1105    Subjective The tape on my right knee feels so good.  I have a stressful week and I need more  tape..Not doing my exercises perfectly.  I am having trouble getting in and out of the car and up fro the couch.  but I was able to take one step   Limitations Walking;Standing;House hold activities   Currently in Pain? No/denies  while sitting, but 2/10 on steps   Pain Location Knee   Pain Orientation Right            [facility] PT Assessment - 09/18/14 1126    AROM   Right Knee Extension 0   Right Knee Flexion 100  Pain on ERP   Left Knee Extension 0   Left Knee Flexion 112                     [facility] Adult PT Treatment/Exercise - 09/18/14 1110    Knee/Hip Exercises: Stretches   Passive Hamstring Stretch 3 reps;30 seconds  sitting works best.  Supine, leg too heavy for patient to do   Press photographer 2 reps;30 seconds  bilaterally standing and sitting shown   Soleus Stretch 2 reps;30 seconds  standing and holding onto wall   Knee/Hip Exercises: Standing   Forward Step Up 5 reps;3 sets;Hand Hold: 2  PT assist  with hand hold.  Pt had ease of movement p 1st set   Other Standing Knee Exercises sit to stand 5 x 3 sets while holding onto chair with incine wedge to increase height of seat    Knee/Hip Exercises: Seated   Heel Slides 10 reps  supine   Knee/Hip Exercises: Supine   Heel Slides Strengthening   Heel Slides Limitations 5 reps AA ROM  81 degrees flexion, supine.   Straight Leg Raises Strengthening  cues, 5 reps   Manual Therapy   Manual therapy comments  IT band insertion Rt. Then 2 Y's of kinesiotex taping i to activate quads , 1 to inhibit lower leg.       Pt takes more time than average pt to complete exercise           PT Education - 09/18/14 1130    Education provided Yes   Education Details reviewed Hamstring stretch and added gastroc, soleus, sit to stand and  performed forward steps.    Person(s) Educated Patient   Methods Explanation;Demonstration;Handout;Verbal cues   Comprehension Verbalized understanding;Returned demonstration          PT Short Term Goals - 09/18/14 1148    PT SHORT TERM GOAL #1   Title independent with initial HEP   Time 4   Period Weeks   Status Achieved   PT SHORT TERM GOAL #2   Title Report pain decrease at rest from 6/10 to 3/10   Baseline 0/10 pain today in sitting. going up the steps was 2/10   Time 4   Period Weeks   Status Achieved   PT SHORT TERM GOAL #3   Title Demonstrate and verbalize understanding of condition management including RICE, positioning, use of AD, HEP   Time 4   Period Weeks   Status Achieved   PT SHORT TERM GOAL #4   Title "Demonstrate understanding of proper sitting posture and be more conscious of position and posture throughout the day.    Time 4   Period Weeks   Status On-going           PT Long Term Goals - 09/18/14 1149    PT LONG TERM GOAL #1   Title "Pt will be independent with advanced HEP   Time 8   Period Weeks   Status On-going   PT LONG TERM GOAL #2   Title "FOTO will improve from 74% limitation  to  54% indicating improved functional mobility   Time 8   Period Weeks   Status On-going   PT LONG TERM GOAL #3   Title "R knee AAROM flexion will improve to 0-115 degrees for improved mobility to ride in car without increased pain   Time 8   Period Weeks   Status On-going   PT LONG TERM GOAL #4   Title Pt will increase R hip and knee AROM in order to don socks with pain level 2/10 or less   Time 8   Period Weeks   Status On-going   PT LONG TERM GOAL #5   Title Pt will demonstrate sit to stand form chair without using compensatory motions(blocking knee, momentum) x 5   Time 8   Period Weeks   Status On-going               Plan - 09/18/14 1204    Clinical Impression Statement Pt able to achieved STG 1, 2 and 3 today.  Pain at 2/10 level and  knows initial HEP.  Left knee flex to 100 degrees.  Pt loves using the KT tape and has benefitted.  Progress goals as pt is able .  Pt needed to continue hip and knee strengthening for getting in and out of car with greater ease   Pt will benefit from skilled therapeutic intervention in order to improve on the following deficits Abnormal gait;Decreased activity tolerance;Decreased mobility;Decreased endurance;Decreased range of motion;Decreased strength;Increased fascial restricitons;Impaired flexibility;Postural dysfunction;Improper body mechanics;Obesity;Pain   Rehab Potential Good   PT Frequency 2x / week   PT Duration 8 weeks   PT Treatment/Interventions ADLs/Self Care Home Management;Cryotherapy;Electrical Stimulation;Stair training;Gait training;Ultrasound;Moist Heat;Iontophoresis 65m/ml Dexamethasone;Functional mobility training;Therapeutic exercise;Neuromuscular re-education;Manual techniques;Patient/family education;Passive range of motion;Dry needling;Taping   PT Next Visit Plan review exercises.  work on [employer]and sit to stand and add steps as able,   continue taping         Problem List Patient Active Problem List   Diagnosis Date Noted  . Loss of weight 08/01/2014  . Coronary atherosclerosis of native coronary artery 10/31/2013  . Incidental lung nodule, > 320mand < 16m30mLeft lower lobe 7mm24mJune 2014, April 2015 unchanged 06/25/2013  . OSA on CPAP 06/21/2013  . 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 Myalgia and myositis 05/02/2013  . Muscle ache 04/03/2013  . Nocturnal hypoxemia 02/17/2013  . Morbid obesity 01/24/2013  . Hiatal hernia seen on CT 08/24/2012  .  LLL nodule 6mm 26mune 2014, described in 2003 07/13/2012  . Asthma, chronic 06/29/2012  . Arthralgia 06/02/2012  . Unspecified asthma(493.90) 06/02/2012  . Hypersomnia with sleep apnea, unspecified 03/11/2012  . Other alteration of consciousness 03/11/2012  . Migraine without aura 03/11/2012  . Encounter for long-term  (current) use of other medications 12/28/2011  . Dyspnea 12/28/2011  . Screening examination for infectious disease 12/28/2011  . Hx of fibroids 08/03/2011  . Knee pain, left 12/25/2010  . NECK PAIN 12/03/2009  . EDEMA 12/18/2008  . TINNITUS 11/22/2007  . HEARING LOSS 11/22/2007  . KNEE PAIN, RIGHT 11/22/2007  . ASYMPTOMATIC POSTMENOPAUSAL STATUS 11/22/2007  . GOITER, MULTINODULAR 07/20/2007  . GERD 07/20/2007  . NASH (nonalcoholic steatohepatitis) 07/20/2007  . HEADACHE 07/20/2007  . DM (diabetes mellitus), type 2 01/13/2007  . HYPERCHOLESTEROLEMIA 01/13/2007  . ANXIETY 10/22/2006  . Essential hypertension 10/22/2006  . ALLERGIC RHINITIS 10/22/2006  . OSTEOARTHRITIS 10/22/2006    LawriVoncille Lo07/19/2016 12:07 PM Phone: [phone] [id number]: [phone]:  [phone]

## 2014-09-18 NOTE — Patient Instructions (Signed)
   SEATED Soleus Stretch: ANKLE: Dorsiflexion - Sitting   Sitting, place strap around foot. Pull foot toward body, keeping heel on floor. Keep foot straight. Hold _30__ seconds. _3__ reps per set, __3_ sets per day, _7__ days per week  Achilles / Gastroc, Standing   Stand, right foot behind, heel on floor and turned slightly out, leg straight, forward leg bent. Move hips forward. Hold _30__ seconds. Repeat _3__ times per session. Do _3__ sessions per day.   Stretching: Soleus   Stand with right foot back, both knees bent. Keeping heel on floor, turned slightly out, lean into wall until stretch is felt in lower calf. Hold _30___ seconds. Repeat __3__ times per set. Do __3__ sessions per day.  http://orth.exer.us/665   Stand to Sit / Sit to Stand   While holding onto a chair in front for support and balance sit to stand 5 x 3. Make sure you have a good rest in between. Make sure you are sitting on a cushion or chair high enough for you to begin.  As you get stronger, you can start from lower chair height.  Remember the dirty toilet exercise. Do exercise daily so getting in and out of car will be easier.  Copyright  VHI. All rights reserved.   Voncille Lo, PT 09/18/2014 11:36 AM Phone: [phone] Fax: [phone]

## 2014-09-20 ENCOUNTER — Ambulatory Visit: Payer: Medicare Other | Admitting: Physical Therapy

## 2014-09-20 DIAGNOSIS — R262 Difficulty in walking, not elsewhere classified: Secondary | ICD-10-CM | POA: Diagnosis not present

## 2014-09-20 DIAGNOSIS — M25561 Pain in right knee: Secondary | ICD-10-CM

## 2014-09-20 DIAGNOSIS — G8929 Other chronic pain: Secondary | ICD-10-CM

## 2014-09-20 DIAGNOSIS — R531 Weakness: Secondary | ICD-10-CM

## 2014-09-20 NOTE — Therapy (Signed)
[place], [place], [address] Phone: [phone]   Fax:  [phone]  Physical Therapy Treatment  Patient Details  Name: Heather [name] MRN: [id number] Date of Birth: 1945/03/26 Referring Provider:  [name], MD  Encounter Date: 09/20/2014      PT End of Session - 09/20/14 1453    Visit Number 5   Number of Visits 16   Date for PT Re-Evaluation 10/16/14   PT Start Time 1330   PT Stop Time 1438   PT Time Calculation (min) 68 min   Activity Tolerance Patient tolerated treatment well   Behavior During Therapy [facility] for tasks assessed/performed      Past Medical History  Diagnosis Date  . ALLERGIC RHINITIS 10/22/2006  . ANEMIA 12/18/2008  . ASYMPTOMATIC POSTMENOPAUSAL STATUS 11/22/2007  . DIABETES MELLITUS, TYPE II 01/13/2007  . GERD 07/20/2007  . GOITER, MULTINODULAR 07/20/2007  . Headache(784.0) 07/20/2007  . HEARING LOSS 11/22/2007  . HYPERCHOLESTEROLEMIA 01/13/2007  . HYPERTENSION 10/22/2006  . OSTEOARTHRITIS 10/22/2006  . Other chronic nonalcoholic liver disease 6/38/4536  . Hyperglycemia   . NASH (nonalcoholic steatohepatitis)   . Fibroid   . Hx of colposcopy with cervical biopsy   . Abnormal Pap smear   . Complication of anesthesia   . Dyslipidemia   . Obesity   . Obstructive sleep apnea   . Degenerative arthritis   . Nocturnal hypoxemia 02/17/2013    Past Surgical History  Procedure Laterality Date  . Esophagogastroduodenoscopy  12/08/2005  . Stress cardiolite  10/21/2005  . Electrocardiogram  10/15/2006  . Dexa  08/2005  . Wisdom tooth extraction    . Breast surgery      Breast reduction  . Sweat gland removal    . Dilation and curettage of uterus    . Eye surgery      There were no vitals filed for this visit.  Visit Diagnosis:  Recurrent knee pain, right  Generalized weakness  Difficulty walking  Knee pain, chronic, right      Subjective Assessment - 09/20/14  1343    Subjective She has picked out a chair to exercise with , has not exercises yet she is singing.  and attending conferences.  They run to Sunday.  Pain 2/10   Currently in Pain? Yes   Pain Score 2   1.5 to 2/10   Pain Location Knee   Pain Orientation Right   Pain Descriptors / Indicators Aching;Sore  Lt knee wiggley, it is irritable   Pain Type Chronic pain   Aggravating Factors  fatigue , walking   Pain Relieving Factors tape, rest                         [facility] Adult PT Treatment/Exercise - 09/20/14 1352    Knee/Hip Exercises: Aerobic   Nustep 5 minutes   Knee/Hip Exercises: Standing   Forward Step Up Right;Left;1 set;Hand Hold: 1;Step Height: 4"   Knee/Hip Exercises: Seated   Sit to Sand 5 reps  from higher mat.  cues   Knee/Hip Exercises: Supine   Straight Leg Raises Strengthening   Straight Leg Raises Limitations AA, able to eccentricly lower after AA Lift   Moist Heat Therapy   Number Minutes Moist Heat 15 Minutes   Moist Heat Location Knee   Manual Therapy   Manual therapy comments Kinesiotex tape removed and re applied.  @ y's anterior leg, 2 I's IT band area  PT Short Term Goals - 09/18/14 1148    PT SHORT TERM GOAL #1   Title independent with initial HEP   Time 4   Period Weeks   Status Achieved   PT SHORT TERM GOAL #2   Title Report pain decrease at rest from 6/10 to 3/10   Baseline 0/10 pain today in sitting. going up the steps was 2/10   Time 4   Period Weeks   Status Achieved   PT SHORT TERM GOAL #3   Title Demonstrate and verbalize understanding of condition management including RICE, positioning, use of AD, HEP   Time 4   Period Weeks   Status Achieved   PT SHORT TERM GOAL #4   Title "Demonstrate understanding of proper sitting posture and be more conscious of position and posture throughout the day.    Time 4   Period Weeks   Status On-going           PT Long Term Goals - 09/18/14 1149    PT  LONG TERM GOAL #1   Title "Pt will be independent with advanced HEP   Time 8   Period Weeks   Status On-going   PT LONG TERM GOAL #2   Title "FOTO will improve from 74% limitation  to  54% indicating improved functional mobility   Time 8   Period Weeks   Status On-going   PT LONG TERM GOAL #3   Title "R knee AAROM flexion will improve to 0-115 degrees for improved mobility to ride in car without increased pain   Time 8   Period Weeks   Status On-going   PT LONG TERM GOAL #4   Title Pt will increase R hip and knee AROM in order to don socks with pain level 2/10 or less   Time 8   Period Weeks   Status On-going   PT LONG TERM GOAL #5   Title Pt will demonstrate sit to stand form chair without using compensatory motions(blocking knee, momentum) x 5   Time 8   Period Weeks   Status On-going               Plan - 09/20/14 1454    Clinical Impression Statement Patient 's function improving with gait. transitions.  Less assist needed with SLR.   Consulted and Agree with Plan of Care Patient        Problem List Patient Active Problem List   Diagnosis Date Noted  . Loss of weight 08/01/2014  . Coronary atherosclerosis of native coronary artery 10/31/2013  . Incidental lung nodule, > 56m and < 858m Left lower lobe 50m82m June 2014, April 2015 unchanged 06/25/2013  . OSA on CPAP 06/21/2013  . 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 Myalgia and myositis 05/02/2013  . Muscle ache 04/03/2013  . Nocturnal hypoxemia 02/17/2013  . Morbid obesity 01/24/2013  . Hiatal hernia seen on CT 08/24/2012  .  LLL nodule 6mm49mJune 2014, described in 2003 07/13/2012  . Asthma, chronic 06/29/2012  . Arthralgia 06/02/2012  . Unspecified asthma(493.90) 06/02/2012  . Hypersomnia with sleep apnea, unspecified 03/11/2012  . Other alteration of consciousness 03/11/2012  . Migraine without aura 03/11/2012  . Encounter for long-term (current) use of other medications 12/28/2011  . Dyspnea  12/28/2011  . Screening examination for infectious disease 12/28/2011  . Hx of fibroids 08/03/2011  . Knee pain, left 12/25/2010  . NECK PAIN 12/03/2009  . EDEMA 12/18/2008  . TINNITUS 11/22/2007  . HEARING  LOSS 11/22/2007  . KNEE PAIN, RIGHT 11/22/2007  . ASYMPTOMATIC POSTMENOPAUSAL STATUS 11/22/2007  . GOITER, MULTINODULAR 07/20/2007  . GERD 07/20/2007  . NASH (nonalcoholic steatohepatitis) 07/20/2007  . HEADACHE 07/20/2007  . DM (diabetes mellitus), type 2 01/13/2007  . HYPERCHOLESTEROLEMIA 01/13/2007  . ANXIETY 10/22/2006  . Essential hypertension 10/22/2006  . ALLERGIC RHINITIS 10/22/2006  . OSTEOARTHRITIS 10/22/2006    Breanda Greenlaw 09/20/2014, 2:57 PM  [facility] [address] [place], [place], [address] Phone: [phone]   Fax:  [phone]     Melvenia Needles, PTA 09/20/2014 2:57 PM Phone: [phone] Fax: [phone]

## 2014-09-25 ENCOUNTER — Ambulatory Visit: Payer: Medicare Other | Admitting: Physical Therapy

## 2014-09-25 DIAGNOSIS — M25561 Pain in right knee: Secondary | ICD-10-CM

## 2014-09-25 DIAGNOSIS — R531 Weakness: Secondary | ICD-10-CM

## 2014-09-25 DIAGNOSIS — R262 Difficulty in walking, not elsewhere classified: Secondary | ICD-10-CM

## 2014-09-25 DIAGNOSIS — G8929 Other chronic pain: Secondary | ICD-10-CM

## 2014-09-25 NOTE — Therapy (Signed)
[place], [place], [address] Phone: [phone]   Fax:  [phone]  Physical Therapy Treatment  Patient Details  Name: Heather [name] MRN: [id number] Date of Birth: 03-10-1945 Referring Provider:  [name], MD  Encounter Date: 09/25/2014      PT End of Session - 09/25/14 1102    Visit Number 6   Number of Visits 16   Date for PT Re-Evaluation 10/16/14   Authorization Type Medicare   Authorization Time Period 10-16-14   PT Start Time 1015   PT Stop Time 1100   PT Time Calculation (min) 45 min   Activity Tolerance Patient tolerated treatment well   Behavior During Therapy [facility] for tasks assessed/performed      Past Medical History  Diagnosis Date  . ALLERGIC RHINITIS 10/22/2006  . ANEMIA 12/18/2008  . ASYMPTOMATIC POSTMENOPAUSAL STATUS 11/22/2007  . DIABETES MELLITUS, TYPE II 01/13/2007  . GERD 07/20/2007  . GOITER, MULTINODULAR 07/20/2007  . Headache(784.0) 07/20/2007  . HEARING LOSS 11/22/2007  . HYPERCHOLESTEROLEMIA 01/13/2007  . HYPERTENSION 10/22/2006  . OSTEOARTHRITIS 10/22/2006  . Other chronic nonalcoholic liver disease 2/54/2706  . Hyperglycemia   . NASH (nonalcoholic steatohepatitis)   . Fibroid   . Hx of colposcopy with cervical biopsy   . Abnormal Pap smear   . Complication of anesthesia   . Dyslipidemia   . Obesity   . Obstructive sleep apnea   . Degenerative arthritis   . Nocturnal hypoxemia 02/17/2013    Past Surgical History  Procedure Laterality Date  . Esophagogastroduodenoscopy  12/08/2005  . Stress cardiolite  10/21/2005  . Electrocardiogram  10/15/2006  . Dexa  08/2005  . Wisdom tooth extraction    . Breast surgery      Breast reduction  . Sweat gland removal    . Dilation and curettage of uterus    . Eye surgery      There were no vitals filed for this visit.  Visit Diagnosis:  Recurrent knee pain, right  Generalized weakness  Difficulty  walking  Knee pain, chronic, right      Subjective Assessment - 09/25/14 1106    Subjective Pt has no pain on entering clinic.  Pt able to walk with more normalized step.  Pain only getting in and out of car. She especially has problems on passenger side.   Limitations Walking;Standing;House hold activities   How long can you sit comfortably? 15   How long can you stand comfortably? 15   How long can you walk comfortably? 15   Patient Stated Goals be able to walk and get in andout of care without pain   Currently in Pain? No/denies   Pain Score 0-No pain   Pain Location Knee   Pain Orientation Right   Pain Descriptors / Indicators Aching;Sore   Pain Type Chronic pain   Pain Onset More than a month ago            [facility] PT Assessment - 09/25/14 1110    AROM   Right Knee Extension 0   Right Knee Flexion 105  Pain on ERP   Left Knee Extension 0   Left Knee Flexion 112                     [facility] Adult PT Treatment/Exercise - 09/25/14 1109    Ambulation/Gait   Ambulation/Gait Yes   Assistive device Straight cane   Ambulation Surface Level   Gait velocity 2.73 ft/sec  with SPC   Gait Comments Pt able to walk without leaning into cane, pt with more even stepping, discussed increasing arm swing and increasing hip flex/stepping   Knee/Hip Exercises: Stretches   Passive Hamstring Stretch 3 reps;30 seconds  sitting works best.  Supine, leg too heavy for patient to do   Passive Hamstring Stretch Limitations sitting in chair with stool to prop R leg   Gastroc Stretch 2 reps;30 seconds  bilaterally standing and sitting shown   Soleus Stretch 2 reps;30 seconds  standing and holding onto wall   Knee/Hip Exercises: Aerobic   Nustep 5 min level 3   Knee/Hip Exercises: Standing   Lateral Step Up Right;10 reps;Step Height: 8";Hand Hold: 2;2 sets   Forward Step Up Right;Left;Hand Hold: 1;Step Height: 4";Step Height: 8";Hand Hold: 2;10 reps;2 sets   Other Standing Knee  Exercises sit to stand 10x 2 sets while holding onto chair with incine wedge to increase height of seat  22 in on high low table   Other Standing Knee Exercises able to sit to stand x 3 without holding onto chair for UE support on 22 in height of chair   Knee/Hip Exercises: Seated   Heel Slides 10 reps  supine   Sit to Sand 1 set;10 reps;with UE support  20 inches from floor   Knee/Hip Exercises: Supine   Straight Leg Raises Strengthening;10 reps  x2    Moist Heat Therapy   Number Minutes Moist Heat 15 Minutes   Moist Heat Location Knee                PT Education - 09/25/14 1221    Education provided Yes   Education Details discussed gait and arm swing and meaning of gait velocity which improved,  performed sit to stand in varying heights and step ups.  Pt has 7 inch step at home.   Person(s) Educated Patient   Methods Explanation;Demonstration   Comprehension Verbalized understanding;Returned demonstration          PT Short Term Goals - 09/18/14 1148    PT SHORT TERM GOAL #1   Title independent with initial HEP   Time 4   Period Weeks   Status Achieved   PT SHORT TERM GOAL #2   Title Report pain decrease at rest from 6/10 to 3/10   Baseline 0/10 pain today in sitting. going up the steps was 2/10   Time 4   Period Weeks   Status Achieved   PT SHORT TERM GOAL #3   Title Demonstrate and verbalize understanding of condition management including RICE, positioning, use of AD, HEP   Time 4   Period Weeks   Status Achieved   PT SHORT TERM GOAL #4   Title "Demonstrate understanding of proper sitting posture and be more conscious of position and posture throughout the day.    Time 4   Period Weeks   Status On-going           PT Long Term Goals - 09/18/14 1149    PT LONG TERM GOAL #1   Title "Pt will be independent with advanced HEP   Time 8   Period Weeks   Status On-going   PT LONG TERM GOAL #2   Title "FOTO will improve from 74% limitation  to  54%  indicating improved functional mobility   Time 8   Period Weeks   Status On-going   PT LONG TERM GOAL #3   Title "R knee AAROM flexion will improve to  0-115 degrees for improved mobility to ride in car without increased pain   Time 8   Period Weeks   Status On-going   PT LONG TERM GOAL #4   Title Pt will increase R hip and knee AROM in order to don socks with pain level 2/10 or less   Time 8   Period Weeks   Status On-going   PT LONG TERM GOAL #5   Title Pt will demonstrate sit to stand form chair without using compensatory motions(blocking knee, momentum) x 5   Time 8   Period Weeks   Status On-going               Plan - 09/25/14 1222    Clinical Impression Statement Pt with improved Gait veleocity to 2.8f/sec from 2.135fsec.  Pt able to perform sit to stand without using UE support 3 times on 22 inch height step.  Pt unable to come sit to stand unassisted from 17 inch height chair.  Pt  improved AROM of knee to 105 but pain on End range   Pt will benefit from skilled therapeutic intervention in order to improve on the following deficits Abnormal gait;Decreased activity tolerance;Decreased mobility;Decreased endurance;Decreased range of motion;Decreased strength;Increased fascial restricitons;Impaired flexibility;Postural dysfunction;Improper body mechanics;Obesity;Pain   Rehab Potential Good   PT Frequency 2x / week   PT Duration 8 weeks   PT Treatment/Interventions ADLs/Self Care Home Management;Cryotherapy;Electrical Stimulation;Stair training;Gait training;Ultrasound;Moist Heat;Iontophoresis 55m48ml Dexamethasone;Functional mobility training;Therapeutic exercise;Neuromuscular re-education;Manual techniques;Patient/family education;Passive range of motion;Dry needling;Taping   PT Next Visit Plan review exercises.  Work on Core, SLR, clamshells add bridge ,   continue taping , sit to stand   Consulted and Agree with Plan of Care Patient        Problem List Patient  Active Problem List   Diagnosis Date Noted  . Loss of weight 08/01/2014  . Coronary atherosclerosis of native coronary artery 10/31/2013  . Incidental lung nodule, > 3mm455md < 8mm,80mft lower lobe 7mm -1mne 2014, April 2015 unchanged 06/25/2013  . OSA on CPAP 06/21/2013  . 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 Myalgia and myositis 05/02/2013  . Muscle ache 04/03/2013  . Nocturnal hypoxemia 02/17/2013  . Morbid obesity 01/24/2013  . Hiatal hernia seen on CT 08/24/2012  .  LLL nodule 6mm - 11me 2014, described in 2003 07/13/2012  . Asthma, chronic 06/29/2012  . Arthralgia 06/02/2012  . Unspecified asthma(493.90) 06/02/2012  . Hypersomnia with sleep apnea, unspecified 03/11/2012  . Other alteration of consciousness 03/11/2012  . Migraine without aura 03/11/2012  . Encounter for long-term (current) use of other medications 12/28/2011  . Dyspnea 12/28/2011  . Screening examination for infectious disease 12/28/2011  . Hx of fibroids 08/03/2011  . Knee pain, left 12/25/2010  . NECK PAIN 12/03/2009  . EDEMA 12/18/2008  . TINNITUS 11/22/2007  . HEARING LOSS 11/22/2007  . KNEE PAIN, RIGHT 11/22/2007  . ASYMPTOMATIC POSTMENOPAUSAL STATUS 11/22/2007  . GOITER, MULTINODULAR 07/20/2007  . GERD 07/20/2007  . NASH (nonalcoholic steatohepatitis) 07/20/2007  . HEADACHE 07/20/2007  . DM (diabetes mellitus), type 2 01/13/2007  . HYPERCHOLESTEROLEMIA 01/13/2007  . ANXIETY 10/22/2006  . Essential hypertension 10/22/2006  . ALLERGIC RHINITIS 10/22/2006  . OSTEOARTHRITIS 10/22/2006   Lawrie Voncille Lo/26/2016 12:29 PM Phone: [phone] [address] [phone]  [phone]

## 2014-09-27 ENCOUNTER — Ambulatory Visit: Payer: Medicare Other | Admitting: Physical Therapy

## 2014-09-27 DIAGNOSIS — G8929 Other chronic pain: Secondary | ICD-10-CM | POA: Diagnosis not present

## 2014-09-27 DIAGNOSIS — R262 Difficulty in walking, not elsewhere classified: Secondary | ICD-10-CM

## 2014-09-27 DIAGNOSIS — M25561 Pain in right knee: Secondary | ICD-10-CM | POA: Diagnosis not present

## 2014-09-27 DIAGNOSIS — R531 Weakness: Secondary | ICD-10-CM | POA: Diagnosis not present

## 2014-09-27 NOTE — Therapy (Signed)
[place], [place], [address] Phone: [phone]   Fax:  [phone]  Physical Therapy Treatment  Patient Details  Name: Heather [name] MRN: [id number] Date of Birth: 1945/09/03 Referring Provider:  [name], MD  Encounter Date: 09/27/2014      PT End of Session - 09/27/14 1320    Visit Number 7   Number of Visits 16   Date for PT Re-Evaluation 10/16/14   Authorization Type Medicare   Authorization Time Period 10-16-14   PT Start Time 1100   PT Stop Time 1200   PT Time Calculation (min) 60 min   Activity Tolerance Patient tolerated treatment well   Behavior During Therapy [facility] for tasks assessed/performed      Past Medical History  Diagnosis Date  . ALLERGIC RHINITIS 10/22/2006  . ANEMIA 12/18/2008  . ASYMPTOMATIC POSTMENOPAUSAL STATUS 11/22/2007  . DIABETES MELLITUS, TYPE II 01/13/2007  . GERD 07/20/2007  . GOITER, MULTINODULAR 07/20/2007  . Headache(784.0) 07/20/2007  . HEARING LOSS 11/22/2007  . HYPERCHOLESTEROLEMIA 01/13/2007  . HYPERTENSION 10/22/2006  . OSTEOARTHRITIS 10/22/2006  . Other chronic nonalcoholic liver disease 08/22/6331  . Hyperglycemia   . NASH (nonalcoholic steatohepatitis)   . Fibroid   . Hx of colposcopy with cervical biopsy   . Abnormal Pap smear   . Complication of anesthesia   . Dyslipidemia   . Obesity   . Obstructive sleep apnea   . Degenerative arthritis   . Nocturnal hypoxemia 02/17/2013    Past Surgical History  Procedure Laterality Date  . Esophagogastroduodenoscopy  12/08/2005  . Stress cardiolite  10/21/2005  . Electrocardiogram  10/15/2006  . Dexa  08/2005  . Wisdom tooth extraction    . Breast surgery      Breast reduction  . Sweat gland removal    . Dilation and curettage of uterus    . Eye surgery      There were no vitals filed for this visit.  Visit Diagnosis:  Recurrent knee pain, right  Generalized weakness  Difficulty  walking  Knee pain, chronic, right      Subjective Assessment - 09/27/14 1105    Subjective Pt enters clinic and on Nustep lateral knee at 90 degrees 3/10 radiating pain.  I had a stressful afternoon on Tuesday.  but pain got better on Wednesday   Limitations Walking;Standing;House hold activities   How long can you sit comfortably?  had to sit for 2/1/2 hours for service    Patient Stated Goals be able to walk and get in andout of care without pain   Currently in Pain? Yes   Pain Score 3    Pain Location Knee   Pain Orientation Right   Pain Descriptors / Indicators Aching   Pain Type Chronic pain   Pain Onset More than a month ago   Pain Frequency Intermittent   Aggravating Factors  fatigue   Pain Relieving Factors tape and rest and sometimes exercise                         [facility] Adult PT Treatment/Exercise - 09/27/14 1114    Ambulation/Gait   Ambulation/Gait --   Assistive device --   Gait velocity --   Gait Comments --   Knee/Hip Exercises: Stretches   Passive Hamstring Stretch 3 reps;30 seconds  sitting works best.  Supine, leg too heavy for patient to do   Best boy Limitations --   Press photographer --  bilaterally standing and sitting shown   Soleus Stretch --  standing and holding onto wall   Knee/Hip Exercises: Aerobic   Nustep 5 min level 4   Knee/Hip Exercises: Standing   Lateral Step Up --   Forward Step Up --   Other Standing Knee Exercises sit to stand 10x 2 sets without UE support height of seat  22 in on high low table   Other Standing Knee Exercises sit to stand with light hand hold x 8 on 17 inch step , 1  3 without UE support   Knee/Hip Exercises: Seated   Heel Slides 10 reps  supine   Sit to Sand 10 reps;with UE support;2 sets  22 inches from floor   Knee/Hip Exercises: Supine   Bridges with Ball Squeeze Both;2 sets;10 reps;Strengthening   Straight Leg Raises Strengthening;10 reps  x2    Other Supine Knee/Hip  Exercises supine clams with red t badn 2 sets of 10 VC for motor control   Moist Heat Therapy   Number Minutes Moist Heat 15 Minutes   Moist Heat Location Knee   Manual Therapy   Manual therapy comments Kinesiotex tape removed and re applied.  @ y's anterior leg, 2 I's IT band area   Myofascial Release IASTYM tool to Right lateral IT band and mx inssertions                PT Education - 09/27/14 1119    Education provided Yes   Education Details sit to stand with UE light support, clams supine with red t band and and bridging with ball, walking program     Person(s) Educated Patient;Spouse   Methods Explanation;Demonstration;Verbal cues   Comprehension Verbalized understanding;Returned demonstration          PT Short Term Goals - 09/18/14 1148    PT SHORT TERM GOAL #1   Title independent with initial HEP   Time 4   Period Weeks   Status Achieved   PT SHORT TERM GOAL #2   Title Report pain decrease at rest from 6/10 to 3/10   Baseline 0/10 pain today in sitting. going up the steps was 2/10   Time 4   Period Weeks   Status Achieved   PT SHORT TERM GOAL #3   Title Demonstrate and verbalize understanding of condition management including RICE, positioning, use of AD, HEP   Time 4   Period Weeks   Status Achieved   PT SHORT TERM GOAL #4   Title "Demonstrate understanding of proper sitting posture and be more conscious of position and posture throughout the day.    Time 4   Period Weeks   Status On-going           PT Long Term Goals - 09/18/14 1149    PT LONG TERM GOAL #1   Title "Pt will be independent with advanced HEP   Time 8   Period Weeks   Status On-going   PT LONG TERM GOAL #2   Title "FOTO will improve from 74% limitation  to  54% indicating improved functional mobility   Time 8   Period Weeks   Status On-going   PT LONG TERM GOAL #3   Title "R knee AAROM flexion will improve to 0-115 degrees for improved mobility to ride in car without  increased pain   Time 8   Period Weeks   Status On-going   PT LONG TERM GOAL #4   Title Pt will increase R hip and  knee AROM in order to don socks with pain level 2/10 or less   Time 8   Period Weeks   Status On-going   PT LONG TERM GOAL #5   Title Pt will demonstrate sit to stand form chair without using compensatory motions(blocking knee, momentum) x 5   Time 8   Period Weeks   Status On-going               Plan - 09/27/14 1133    Clinical Impression Statement Pt gaining strength and was able to sit to stand x 3 from 17 inch chair height without UE support.  Pt progressing with core exercise and was educated on walking program and importance of movement.    Pt will benefit from skilled therapeutic intervention in order to improve on the following deficits Abnormal gait;Decreased activity tolerance;Decreased mobility;Decreased endurance;Decreased range of motion;Decreased strength;Increased fascial restricitons;Impaired flexibility;Postural dysfunction;Improper body mechanics;Obesity;Pain   Rehab Potential Good   PT Duration 8 weeks   PT Treatment/Interventions ADLs/Self Care Home Management;Cryotherapy;Electrical Stimulation;Stair training;Gait training;Ultrasound;Moist Heat;Iontophoresis 45m/ml Dexamethasone;Functional mobility training;Therapeutic exercise;Neuromuscular re-education;Manual techniques;Patient/family education;Passive range of motion;Dry needling;Taping   PT Next Visit Plan review exercises and progress core exerciise. Review clams and bridging.  step ups, Assess goals   PT Home Exercise Plan walking program, added supine clams and bridging to HEP   Consulted and Agree with Plan of Care Patient;Family member/caregiver        Problem List Patient Active Problem List   Diagnosis Date Noted  . Loss of weight 08/01/2014  . Coronary atherosclerosis of native coronary artery 10/31/2013  . Incidental lung nodule, > 362mand < 28m81mLeft lower lobe 7mm40mJune 2014,  April 2015 unchanged 06/25/2013  . OSA on CPAP 06/21/2013  . 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 Myalgia and myositis 05/02/2013  . Muscle ache 04/03/2013  . Nocturnal hypoxemia 02/17/2013  . Morbid obesity 01/24/2013  . Hiatal hernia seen on CT 08/24/2012  .  LLL nodule 6mm 54mune 2014, described in 2003 07/13/2012  . Asthma, chronic 06/29/2012  . Arthralgia 06/02/2012  . Unspecified asthma(493.90) 06/02/2012  . Hypersomnia with sleep apnea, unspecified 03/11/2012  . Other alteration of consciousness 03/11/2012  . Migraine without aura 03/11/2012  . Encounter for long-term (current) use of other medications 12/28/2011  . Dyspnea 12/28/2011  . Screening examination for infectious disease 12/28/2011  . Hx of fibroids 08/03/2011  . Knee pain, left 12/25/2010  . NECK PAIN 12/03/2009  . EDEMA 12/18/2008  . TINNITUS 11/22/2007  . HEARING LOSS 11/22/2007  . KNEE PAIN, RIGHT 11/22/2007  . ASYMPTOMATIC POSTMENOPAUSAL STATUS 11/22/2007  . GOITER, MULTINODULAR 07/20/2007  . GERD 07/20/2007  . NASH (nonalcoholic steatohepatitis) 07/20/2007  . HEADACHE 07/20/2007  . DM (diabetes mellitus), type 2 01/13/2007  . HYPERCHOLESTEROLEMIA 01/13/2007  . ANXIETY 10/22/2006  . Essential hypertension 10/22/2006  . ALLERGIC RHINITIS 10/22/2006  . OSTEOARTHRITIS 10/22/2006   LawriVoncille Lo07/28/2016 1:25 PM Phone: [phone] [id number]: [phone]:  [phone]

## 2014-09-27 NOTE — Patient Instructions (Addendum)
WALKING  Walking is a great form of exercise to increase your strength, endurance and overall fitness.  A walking program can help you start slowly and gradually build endurance as you go.  Everyone's ability is different, so each person's starting point will be different.  You do not have to follow them exactly.  The are just samples. You should simply find out what's right for you and stick to that program.   In the beginning, you'll start off walking 2-3 times a day for short distances.  As you get stronger, you'll be walking further at just 1-2 times per day.  A. You Can Walk For A Certain Length Of Time Each Day    Walk 5 minutes 3 times per day.  Increase 2 minutes every 2 days (3 times per day).  Work up to 25-30 minutes (1-2 times per day).   Example:   Day 1-2 5 minutes 3 times per day   Day 7-8 12 minutes 2-3 times per day   Day 13-14 25 minutes 1-2 times per day  B. You Can Walk For a Certain Distance Each Day     Distance can be substituted for time.    Example:   3 trips to mailbox (at road)   3 trips to corner of block   3 trips around the block  C. Go to local [employer] and use the track.    Walk for distance ____ around track  Or time ____ minutes  D. Walk ____ Jog ____ Run ___  Please only do the exercises that your therapist has initialed and dated        Bracing With Bridging (Hook-Lying)   With neutral spine, tighten pelvic floor and abdominals and hold. Lift bottom. Repeat _10 x2__ times. Do 1-2___ times a day. Use ball between knees and lift hips up.  External Rotation: Hip - Knees Apart (Hook-Lying)   Lie with hips and knees bent, band tied just above knees. Pull knees apart. Hold for _5__ seconds.  Repeat _10 x 2__ times. Do _1-2__ times a day. Slowly separate knees and slowly bring knee reminder  Voncille Lo, PT 09/27/2014 11:32 AM Phone: [phone] Fax: [phone]   .

## 2014-10-02 ENCOUNTER — Encounter: Payer: PRIVATE HEALTH INSURANCE | Admitting: Physical Therapy

## 2014-10-04 ENCOUNTER — Ambulatory Visit: Payer: Medicare Other | Attending: Endocrinology | Admitting: Physical Therapy

## 2014-10-04 DIAGNOSIS — R262 Difficulty in walking, not elsewhere classified: Secondary | ICD-10-CM | POA: Diagnosis not present

## 2014-10-04 DIAGNOSIS — R531 Weakness: Secondary | ICD-10-CM

## 2014-10-04 DIAGNOSIS — M25561 Pain in right knee: Secondary | ICD-10-CM

## 2014-10-04 DIAGNOSIS — G8929 Other chronic pain: Secondary | ICD-10-CM | POA: Insufficient documentation

## 2014-10-04 NOTE — Therapy (Signed)
[place], [place], [address] Phone: [phone]   Fax:  [phone]  Physical Therapy Treatment  Patient Details  Name: Heather [name] MRN: [id number] Date of Birth: 11-06-1945 Referring Provider:  [name], MD  Encounter Date: 10/04/2014      PT End of Session - 10/04/14 1400    Visit Number 8   Date for PT Re-Evaluation 10/16/14   PT Start Time 1102   PT Stop Time 1146   PT Time Calculation (min) 44 min   Activity Tolerance Patient tolerated treatment well;No increased pain   Behavior During Therapy [facility] for tasks assessed/performed      Past Medical History  Diagnosis Date  . ALLERGIC RHINITIS 10/22/2006  . ANEMIA 12/18/2008  . ASYMPTOMATIC POSTMENOPAUSAL STATUS 11/22/2007  . DIABETES MELLITUS, TYPE II 01/13/2007  . GERD 07/20/2007  . GOITER, MULTINODULAR 07/20/2007  . Headache(784.0) 07/20/2007  . HEARING LOSS 11/22/2007  . HYPERCHOLESTEROLEMIA 01/13/2007  . HYPERTENSION 10/22/2006  . OSTEOARTHRITIS 10/22/2006  . Other chronic nonalcoholic liver disease 6/96/2952  . Hyperglycemia   . NASH (nonalcoholic steatohepatitis)   . Fibroid   . Hx of colposcopy with cervical biopsy   . Abnormal Pap smear   . Complication of anesthesia   . Dyslipidemia   . Obesity   . Obstructive sleep apnea   . Degenerative arthritis   . Nocturnal hypoxemia 02/17/2013    Past Surgical History  Procedure Laterality Date  . Esophagogastroduodenoscopy  12/08/2005  . Stress cardiolite  10/21/2005  . Electrocardiogram  10/15/2006  . Dexa  08/2005  . Wisdom tooth extraction    . Breast surgery      Breast reduction  . Sweat gland removal    . Dilation and curettage of uterus    . Eye surgery      There were no vitals filed for this visit.  Visit Diagnosis:  Generalized weakness  Recurrent knee pain, right  Difficulty walking      Subjective Assessment - 10/04/14 1109    Subjective Has not started  walking but has a purchase a fit bit.  She needs to program it today and will walk  Wants tapeing today already removed.  Less pain with getting in and out of car.  Knee is not as tight.  Able to  step over step up some steps now.   Currently in Pain? No/denies   Pain Location Knee   Pain Orientation Right   Pain Descriptors / Indicators --  sore, very intermittant now.    Aggravating Factors  putting on socks , crossing legs, twisting knees   Pain Relieving Factors PT, tape   Multiple Pain Sites No                         [facility] Adult PT Treatment/Exercise - 10/04/14 1119    Knee/Hip Exercises: Stretches   Passive Hamstring Stretch 1 rep;30 seconds  or longer sitting at end of mat.   Knee/Hip Exercises: Aerobic   Nustep 12 minutes Level 4   Knee/Hip Exercises: Standing   Forward Step Up 10 reps;2 sets   Other Standing Knee Exercises sit to stand.  less effort needed.    Manual Therapy   Manual therapy comments Kinesiotex applied I X2 IT band, Y X2 anterior leg, Fan X2 distal quads                   PT Short Term Goals -  09/18/14 1148    PT SHORT TERM GOAL #1   Title independent with initial HEP   Time 4   Period Weeks   Status Achieved   PT SHORT TERM GOAL #2   Title Report pain decrease at rest from 6/10 to 3/10   Baseline 0/10 pain today in sitting. going up the steps was 2/10   Time 4   Period Weeks   Status Achieved   PT SHORT TERM GOAL #3   Title Demonstrate and verbalize understanding of condition management including RICE, positioning, use of AD, HEP   Time 4   Period Weeks   Status Achieved   PT SHORT TERM GOAL #4   Title "Demonstrate understanding of proper sitting posture and be more conscious of position and posture throughout the day.    Time 4   Period Weeks   Status On-going           PT Long Term Goals - 10/04/14 1356    PT LONG TERM GOAL #3   Title "R knee AAROM flexion will improve to 0-115 degrees for improved  mobility to ride in car without increased pain   Baseline no pain with riding in car.  ROM not formally measured.   Time 8   Period Weeks   Status Partially Met   PT LONG TERM GOAL #4   Title Pt will increase R hip and knee AROM in order to don socks with pain level 2/10 or less   Baseline donning socks still a little painful    Time 8   Period Weeks   Status On-going   PT LONG TERM GOAL #5   Title Pt will demonstrate sit to stand form chair without using compensatory motions(blocking knee, momentum) x 5   Time 8   Period Weeks   Status On-going               Plan - 10/04/14 1401    Clinical Impression Statement Patient much less painful with gait on level and on stairs.  She is more active and uses her hands less in clinic for moving her leg.   She is more functional on stairs.     PT Next Visit Plan review exercises and progress core exerciise. Review clams and bridging.  step ups, Assess goals see how walking went over the weekend.   Consulted and Agree with Plan of Care Patient        Problem List Patient Active Problem List   Diagnosis Date Noted  . Loss of weight 08/01/2014  . Coronary atherosclerosis of native coronary artery 10/31/2013  . Incidental lung nodule, > 61m and < 826m Left lower lobe 20m620m June 2014, April 2015 unchanged 06/25/2013  . OSA on CPAP 06/21/2013  . 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 Myalgia and myositis 05/02/2013  . Muscle ache 04/03/2013  . Nocturnal hypoxemia 02/17/2013  . Morbid obesity 01/24/2013  . Hiatal hernia seen on CT 08/24/2012  .  LLL nodule 6mm52mJune 2014, described in 2003 07/13/2012  . Asthma, chronic 06/29/2012  . Arthralgia 06/02/2012  . Unspecified asthma(493.90) 06/02/2012  . Hypersomnia with sleep apnea, unspecified 03/11/2012  . Other alteration of consciousness 03/11/2012  . Migraine without aura 03/11/2012  . Encounter for long-term (current) use of other medications 12/28/2011  . Dyspnea 12/28/2011  .  Screening examination for infectious disease 12/28/2011  . Hx of fibroids 08/03/2011  . Knee pain, left 12/25/2010  . NECK PAIN 12/03/2009  . EDEMA 12/18/2008  .  TINNITUS 11/22/2007  . HEARING LOSS 11/22/2007  . KNEE PAIN, RIGHT 11/22/2007  . ASYMPTOMATIC POSTMENOPAUSAL STATUS 11/22/2007  . GOITER, MULTINODULAR 07/20/2007  . GERD 07/20/2007  . NASH (nonalcoholic steatohepatitis) 07/20/2007  . HEADACHE 07/20/2007  . DM (diabetes mellitus), type 2 01/13/2007  . HYPERCHOLESTEROLEMIA 01/13/2007  . ANXIETY 10/22/2006  . Essential hypertension 10/22/2006  . ALLERGIC RHINITIS 10/22/2006  . OSTEOARTHRITIS 10/22/2006    HARRIS,KAREN 10/04/2014, 2:12 PM  [facility] [address] [place], [place], [address] Phone: [phone]   Fax:  [phone]     Melvenia Needles, PTA 10/04/2014 2:12 PM Phone: [phone] Fax: [phone]

## 2014-10-09 ENCOUNTER — Ambulatory Visit: Payer: Medicare Other | Admitting: Physical Therapy

## 2014-10-09 DIAGNOSIS — M25561 Pain in right knee: Secondary | ICD-10-CM | POA: Diagnosis not present

## 2014-10-09 DIAGNOSIS — R531 Weakness: Secondary | ICD-10-CM | POA: Diagnosis not present

## 2014-10-09 DIAGNOSIS — R262 Difficulty in walking, not elsewhere classified: Secondary | ICD-10-CM

## 2014-10-09 DIAGNOSIS — G8929 Other chronic pain: Secondary | ICD-10-CM | POA: Diagnosis not present

## 2014-10-09 NOTE — Therapy (Signed)
[place], [place], [address] Phone: [phone]   Fax:  [phone]  Physical Therapy Treatment  Patient Details  Name: Heather [name] MRN: [id number] Date of Birth: August 23, 1945 Referring Provider:  [name], MD  Encounter Date: 10/09/2014      PT End of Session - 10/09/14 1219    Visit Number 9   Number of Visits 16   Date for PT Re-Evaluation 10/16/14   PT Start Time 1114   PT Stop Time 1210   PT Time Calculation (min) 56 min   Activity Tolerance Patient tolerated treatment well   Behavior During Therapy [facility] for tasks assessed/performed      Past Medical History  Diagnosis Date  . ALLERGIC RHINITIS 10/22/2006  . ANEMIA 12/18/2008  . ASYMPTOMATIC POSTMENOPAUSAL STATUS 11/22/2007  . DIABETES MELLITUS, TYPE II 01/13/2007  . GERD 07/20/2007  . GOITER, MULTINODULAR 07/20/2007  . Headache(784.0) 07/20/2007  . HEARING LOSS 11/22/2007  . HYPERCHOLESTEROLEMIA 01/13/2007  . HYPERTENSION 10/22/2006  . OSTEOARTHRITIS 10/22/2006  . Other chronic nonalcoholic liver disease 0/10/3816  . Hyperglycemia   . NASH (nonalcoholic steatohepatitis)   . Fibroid   . Hx of colposcopy with cervical biopsy   . Abnormal Pap smear   . Complication of anesthesia   . Dyslipidemia   . Obesity   . Obstructive sleep apnea   . Degenerative arthritis   . Nocturnal hypoxemia 02/17/2013    Past Surgical History  Procedure Laterality Date  . Esophagogastroduodenoscopy  12/08/2005  . Stress cardiolite  10/21/2005  . Electrocardiogram  10/15/2006  . Dexa  08/2005  . Wisdom tooth extraction    . Breast surgery      Breast reduction  . Sweat gland removal    . Dilation and curettage of uterus    . Eye surgery      There were no vitals filed for this visit.  Visit Diagnosis:  Recurrent knee pain, right  Generalized weakness  Difficulty walking      Subjective Assessment - 10/09/14 1116    Subjective Pain only  with certain movements.  No pain now.  Walking was fine.  It is the transitions and the low spots that increase pain.  Able to stand longer with the cane, daughter noticed she was more upright. .  More upright with leaning on the shopping cart.   Currently in Pain? No/denies   Pain Score 0-No pain  up to 6-7 with certain movements    Pain Location Knee   Pain Orientation Right   Pain Descriptors / Indicators --  severe   Pain Frequency Intermittent   Aggravating Factors  sitting on low chair, folding chairs,  sitting with legs folding up for too lone   Pain Relieving Factors Moving around,    Multiple Pain Sites --  Only gets back pain with longer standing and it is not as severe.                         [facility] Adult PT Treatment/Exercise - 10/09/14 1122    Transfers   Comments lowering and rising from chair instructions, practiced   Knee/Hip Exercises: Stretches   Other Knee/Hip Stretches Feet together stretching knees apart as much as possible to help ROM for donning shoes 2 reps 20 seconds.  Lt lateral hip and lt foot cramp with then  assisted her to get the cramp out.   Knee/Hip Exercises: Aerobic   Nustep 7.5 minutes  Able to do without pain   Knee/Hip Exercises: Standing   Knee Flexion Limitations 90 degrees AAROM measured in nu step with use of arms.   Lateral Step Up Right   Lateral Step Up Limitations 10 reps   Forward Step Up 10 reps   Knee/Hip Exercises: Supine   Straight Leg Raises 10 reps   Other Supine Knee/Hip Exercises supine abdominal brace with march, 10  X each  also brace with clams                PT Education - 10/09/14 1218    Education provided Yes   Education Details Post PT group information   Person(s) Educated Patient   Methods Explanation;Handout   Comprehension Verbalized understanding          PT Short Term Goals - 10/09/14 1222    PT SHORT TERM GOAL #1   Title independent with initial HEP   Time 4   Period Weeks    Status Achieved   PT SHORT TERM GOAL #2   Title Report pain decrease at rest from 6/10 to 3/10   Baseline 0/10 pain today in sitting. going up the steps was 2/10   Time 4   Period Weeks   Status Achieved   PT SHORT TERM GOAL #3   Title Demonstrate and verbalize understanding of condition management including RICE, positioning, use of AD, HEP   Time 4   Period Weeks   Status Achieved   PT SHORT TERM GOAL #4   Title "Demonstrate understanding of proper sitting posture and be more conscious of position and posture throughout the day.    Time 4   Period Weeks   Status Achieved           PT Long Term Goals - 10/09/14 1223    PT LONG TERM GOAL #1   Title "Pt will be independent with advanced HEP   Time 8   Period Weeks   Status On-going   PT LONG TERM GOAL #2   Title "FOTO will improve from 74% limitation  to  54% indicating improved functional mobility   Time 8   Period Weeks   Status Unable to assess   PT LONG TERM GOAL #3   Title "R knee AAROM flexion will improve to 0-115 degrees for improved mobility to ride in car without increased pain   Baseline no pain with riding in car.  AAROM 90 degrees RT knee flexion.   Time 8   Status Partially Met   PT LONG TERM GOAL #4   Title Pt will increase R hip and knee AROM in order to don socks with pain level 2/10 or less   Baseline unable to don sock RT.  Extra time Lt no pain LT   Time 8   Period Weeks   Status On-going   PT LONG TERM GOAL #5   Title Pt will demonstrate sit to stand form chair without using compensatory motions(blocking knee, momentum) x 5   Baseline compensatory motions needed for more than 1 rep from standard height chair.   Time 8   Period Weeks   Status On-going               Plan - 10/09/14 1219    Clinical Impression Statement Patient met posture goal. She cannot don sock on RT foot.  2 more visits in Plan of care.  Patient interested in Post PT exercise group.   PT Next Visit Plan 2 more  visits in [place].  FOTO.  Maximize strengthening work on what is needed to don sock. and meet other unmet goals.  ROM   Consulted and Agree with Plan of Care Patient        Problem List Patient Active Problem List   Diagnosis Date Noted  . Loss of weight 08/01/2014  . Coronary atherosclerosis of native coronary artery 10/31/2013  . Incidental lung nodule, > 1m and < 840m Left lower lobe 38m22m June 2014, April 2015 unchanged 06/25/2013  . OSA on CPAP 06/21/2013  . 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 Myalgia and myositis 05/02/2013  . Muscle ache 04/03/2013  . Nocturnal hypoxemia 02/17/2013  . Morbid obesity 01/24/2013  . Hiatal hernia seen on CT 08/24/2012  .  LLL nodule 6mm50mJune 2014, described in 2003 07/13/2012  . Asthma, chronic 06/29/2012  . Arthralgia 06/02/2012  . Unspecified asthma(493.90) 06/02/2012  . Hypersomnia with sleep apnea, unspecified 03/11/2012  . Other alteration of consciousness 03/11/2012  . Migraine without aura 03/11/2012  . Encounter for long-term (current) use of other medications 12/28/2011  . Dyspnea 12/28/2011  . Screening examination for infectious disease 12/28/2011  . Hx of fibroids 08/03/2011  . Knee pain, left 12/25/2010  . NECK PAIN 12/03/2009  . EDEMA 12/18/2008  . TINNITUS 11/22/2007  . HEARING LOSS 11/22/2007  . KNEE PAIN, RIGHT 11/22/2007  . ASYMPTOMATIC POSTMENOPAUSAL STATUS 11/22/2007  . GOITER, MULTINODULAR 07/20/2007  . GERD 07/20/2007  . NASH (nonalcoholic steatohepatitis) 07/20/2007  . HEADACHE 07/20/2007  . DM (diabetes mellitus), type 2 01/13/2007  . HYPERCHOLESTEROLEMIA 01/13/2007  . ANXIETY 10/22/2006  . Essential hypertension 10/22/2006  . ALLERGIC RHINITIS 10/22/2006  . OSTEOARTHRITIS 10/22/2006    HARRIS,KAREN 10/09/2014, 12:27 PM  [facility] [place], [place]ne: [phone]:  [phone] KareMelvenia NeedlesA 10/09/2014  12:27 PM Phone: [phone]: [phone]

## 2014-10-11 ENCOUNTER — Ambulatory Visit: Payer: Medicare Other | Admitting: Physical Therapy

## 2014-10-11 DIAGNOSIS — G8929 Other chronic pain: Secondary | ICD-10-CM | POA: Diagnosis not present

## 2014-10-11 DIAGNOSIS — M25561 Pain in right knee: Secondary | ICD-10-CM | POA: Diagnosis not present

## 2014-10-11 DIAGNOSIS — R262 Difficulty in walking, not elsewhere classified: Secondary | ICD-10-CM

## 2014-10-11 DIAGNOSIS — R531 Weakness: Secondary | ICD-10-CM | POA: Diagnosis not present

## 2014-10-11 NOTE — Therapy (Addendum)
[place], [place], [address] Phone: [phone]   Fax:  [phone]  Physical Therapy Treatment  Patient Details  Name: Heather [name] MRN: [id number] Date of Birth: 02-23-1946 Referring Provider:  [name], MD  Encounter Date: 10/11/2014      PT End of Session - 10/11/14 1215    Visit Number 10   Number of Visits 16   Date for PT Re-Evaluation 10/16/14   PT Start Time 1114   PT Stop Time 1200   PT Time Calculation (min) 46 min   Activity Tolerance Patient tolerated treatment well;Patient limited by pain   Behavior During Therapy [facility] for tasks assessed/performed      Past Medical History  Diagnosis Date  . ALLERGIC RHINITIS 10/22/2006  . ANEMIA 12/18/2008  . ASYMPTOMATIC POSTMENOPAUSAL STATUS 11/22/2007  . DIABETES MELLITUS, TYPE II 01/13/2007  . GERD 07/20/2007  . GOITER, MULTINODULAR 07/20/2007  . Headache(784.0) 07/20/2007  . HEARING LOSS 11/22/2007  . HYPERCHOLESTEROLEMIA 01/13/2007  . HYPERTENSION 10/22/2006  . OSTEOARTHRITIS 10/22/2006  . Other chronic nonalcoholic liver disease 6/38/1771  . Hyperglycemia   . NASH (nonalcoholic steatohepatitis)   . Fibroid   . Hx of colposcopy with cervical biopsy   . Abnormal Pap smear   . Complication of anesthesia   . Dyslipidemia   . Obesity   . Obstructive sleep apnea   . Degenerative arthritis   . Nocturnal hypoxemia 02/17/2013    Past Surgical History  Procedure Laterality Date  . Esophagogastroduodenoscopy  12/08/2005  . Stress cardiolite  10/21/2005  . Electrocardiogram  10/15/2006  . Dexa  08/2005  . Wisdom tooth extraction    . Breast surgery      Breast reduction  . Sweat gland removal    . Dilation and curettage of uterus    . Eye surgery      There were no vitals filed for this visit.  Visit Diagnosis:  Generalized weakness  Difficulty walking  Recurrent knee pain, right                       [facility]  Adult PT Treatment/Exercise - 10/11/14 1140    Transfers   Comments reviewed how to get in out of car witth out twisting.  Suggested  she get a step stool.   Self-Care   Self-Care Other Self-Care Comments  taping instructions given as it was applied by PTA   Other Self-Care Comments  simulated donning sock.  needs more hip flexion, ER  strength   Knee/Hip Exercises: Stretches   Other Knee/Hip Stretches Single knee to chest 5 reps with strap 10 seconds hold.   Knee/Hip Exercises: Aerobic   Nustep Nustep 5 minutes. L$ painful today   Knee/Hip Exercises: Seated   Long Arc Quad 10 reps   Heel Slides 10 reps   Knee/Hip Exercises: Supine   Quad Sets 10 reps                PT Education - 10/11/14 1215    Education provided Yes   Education Details knee extension/flexion   Person(s) Educated Patient   Methods Explanation;Demonstration;Verbal cues;Handout   Comprehension Verbalized understanding;Returned demonstration          PT Short Term Goals - 10/09/14 1222    PT SHORT TERM GOAL #1   Title independent with initial HEP   Time 4   Period Weeks   Status Achieved   PT SHORT TERM GOAL #2  Title Report pain decrease at rest from 6/10 to 3/10   Baseline 0/10 pain today in sitting. going up the steps was 2/10   Time 4   Period Weeks   Status Achieved   PT SHORT TERM GOAL #3   Title Demonstrate and verbalize understanding of condition management including RICE, positioning, use of AD, HEP   Time 4   Period Weeks   Status Achieved   PT SHORT TERM GOAL #4   Title "Demonstrate understanding of proper sitting posture and be more conscious of position and posture throughout the day.    Time 4   Period Weeks   Status Achieved           PT Long Term Goals - 10/09/14 1223    PT LONG TERM GOAL #1   Title "Pt will be independent with advanced HEP   Time 8   Period Weeks   Status On-going   PT LONG TERM GOAL #2   Title "FOTO will improve from 74% limitation  to  54%  indicating improved functional mobility   Time 8   Period Weeks   Status Unable to assess   PT LONG TERM GOAL #3   Title "R knee AAROM flexion will improve to 0-115 degrees for improved mobility to ride in car without increased pain   Baseline no pain with riding in car.  AAROM 90 degrees RT knee flexion.   Time 8   Status Partially Met   PT LONG TERM GOAL #4   Title Pt will increase R hip and knee AROM in order to don socks with pain level 2/10 or less   Baseline unable to don sock RT.  Extra time Lt no pain LT   Time 8   Period Weeks   Status On-going   PT LONG TERM GOAL #5   Title Pt will demonstrate sit to stand form chair without using compensatory motions(blocking knee, momentum) x 5   Baseline compensatory motions needed for more than 1 rep from standard height chair.   Time 8   Period Weeks   Status On-going               Plan - 10/11/14 1216    Clinical Impression Statement Patient unable to touch her toes to don sock, she asks for help at home.  Patient is interested in her husband learning how to tape knee.  Pain flare up today not sure the cause.  6/10 with driving.  Pain less post session.   PT Next Visit Plan 1 more visit.  teach taping.  FOTO   PT Home Exercise Plan knee Flexion/extension   Consulted and Agree with Plan of Care Patient        Problem List Patient Active Problem List   Diagnosis Date Noted  . Loss of weight 08/01/2014  . Coronary atherosclerosis of native coronary artery 10/31/2013  . Incidental lung nodule, > 60m and < 821m Left lower lobe 42m8m June 2014, April 2015 unchanged 06/25/2013  . OSA on CPAP 06/21/2013  . 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 Myalgia and myositis 05/02/2013  . Muscle ache 04/03/2013  . Nocturnal hypoxemia 02/17/2013  . Morbid obesity 01/24/2013  . Hiatal hernia seen on CT 08/24/2012  .  LLL nodule 6mm69mJune 2014, described in 2003 07/13/2012  . Asthma, chronic 06/29/2012  . Arthralgia 06/02/2012  .  Unspecified asthma(493.90) 06/02/2012  . Hypersomnia with sleep apnea, unspecified 03/11/2012  . Other alteration of consciousness 03/11/2012  .  Migraine without aura 03/11/2012  . Encounter for long-term (current) use of other medications 12/28/2011  . Dyspnea 12/28/2011  . Screening examination for infectious disease 12/28/2011  . Hx of fibroids 08/03/2011  . Knee pain, left 12/25/2010  . NECK PAIN 12/03/2009  . EDEMA 12/18/2008  . TINNITUS 11/22/2007  . HEARING LOSS 11/22/2007  . KNEE PAIN, RIGHT 11/22/2007  . ASYMPTOMATIC POSTMENOPAUSAL STATUS 11/22/2007  . GOITER, MULTINODULAR 07/20/2007  . GERD 07/20/2007  . NASH (nonalcoholic steatohepatitis) 07/20/2007  . HEADACHE 07/20/2007  . DM (diabetes mellitus), type 2 01/13/2007  . HYPERCHOLESTEROLEMIA 01/13/2007  . ANXIETY 10/22/2006  . Essential hypertension 10/22/2006  . ALLERGIC RHINITIS 10/22/2006  . OSTEOARTHRITIS 10/22/2006    Sahra Converse 10/11/2014, 12:20 PM  [facility] [address] [place], [place], [address] Phone: [phone]   Fax:  [phone]     Melvenia Needles, PTA 10/11/2014 12:20 PM Phone: [phone] Fax: [phone]  G code PT  Mobility   Current CK   Goal CK Voncille Lo, PT 10/23/2014 10:16 AM Phone: [phone] Fax: [phone]

## 2014-10-11 NOTE — Patient Instructions (Signed)
Chair Knee Flexion   Keeping feet on floor, slide foot of operated leg back, bending knee. Hold _5___ seconds. Repeat _10 to 30 times___ times. Do _2___ sessions a day.  http://gt2.exer.us/305   Copyright  VHI. All rights reserved.

## 2014-10-15 ENCOUNTER — Ambulatory Visit: Payer: Medicare Other | Admitting: Physical Therapy

## 2014-10-15 DIAGNOSIS — G8929 Other chronic pain: Secondary | ICD-10-CM

## 2014-10-15 DIAGNOSIS — R531 Weakness: Secondary | ICD-10-CM | POA: Diagnosis not present

## 2014-10-15 DIAGNOSIS — M25561 Pain in right knee: Secondary | ICD-10-CM | POA: Diagnosis not present

## 2014-10-15 DIAGNOSIS — R262 Difficulty in walking, not elsewhere classified: Secondary | ICD-10-CM | POA: Diagnosis not present

## 2014-10-15 NOTE — Therapy (Addendum)
[place], [place], [address] Phone: [phone]   Fax:  [phone]  Physical Therapy Treatment/Discharge Note  Patient Details  Name: Heather [name] MRN: [id number] Date of Birth: 1946-01-31 Referring Provider:  [name], [name]  Encounter Date: 10/15/2014      Past Medical History  Diagnosis Date  . ALLERGIC RHINITIS 10/22/2006  . ANEMIA 12/18/2008  . ASYMPTOMATIC POSTMENOPAUSAL STATUS 11/22/2007  . DIABETES MELLITUS, TYPE II 01/13/2007  . GERD 07/20/2007  . GOITER, MULTINODULAR 07/20/2007  . Headache(784.0) 07/20/2007  . HEARING LOSS 11/22/2007  . HYPERCHOLESTEROLEMIA 01/13/2007  . HYPERTENSION 10/22/2006  . OSTEOARTHRITIS 10/22/2006  . Other chronic nonalcoholic liver disease 0/08/6224  . Hyperglycemia   . NASH (nonalcoholic steatohepatitis)   . Fibroid   . Hx of colposcopy with cervical biopsy   . Abnormal Pap smear   . Complication of anesthesia   . Dyslipidemia   . Obesity   . Obstructive sleep apnea   . Degenerative arthritis   . Nocturnal hypoxemia 02/17/2013    Past Surgical History  Procedure Laterality Date  . Esophagogastroduodenoscopy  12/08/2005  . Stress cardiolite  10/21/2005  . Electrocardiogram  10/15/2006  . Dexa  08/2005  . Wisdom tooth extraction    . Breast surgery      Breast reduction  . Sweat gland removal    . Dilation and curettage of uterus    . Eye surgery      There were no vitals filed for this visit.  Visit Diagnosis:  Generalized weakness  Recurrent knee pain, right  Knee pain, chronic, right                                 PT Short Term Goals - 10/15/14 1705    PT SHORT TERM GOAL #1   Title independent with initial HEP   Time 4   Period Weeks   Status Achieved   PT SHORT TERM GOAL #2   Title Report pain decrease at rest from 6/10 to 3/10   Baseline 0/10 pain today   Time 4   Period Weeks   Status Achieved   PT  SHORT TERM GOAL #3   Time 4   Period Weeks   Status Achieved   PT SHORT TERM GOAL #4   Title "Demonstrate understanding of proper sitting posture and be more conscious of position and posture throughout the day.    Time 4   Period Weeks   Status Achieved           PT Long Term Goals - 10/15/14 1706    PT LONG TERM GOAL #1   Title "Pt will be independent with advanced HEP   Baseline independent   Time 8   Period Weeks   Status Achieved   PT LONG TERM GOAL #2   Title "FOTO will improve from 74% limitation  to  54% indicating improved functional mobility   Baseline 53% limitation   Time 8   Period Weeks   Status Achieved   PT LONG TERM GOAL #3   Title "R knee AAROM flexion will improve to 0-115 degrees for improved mobility to ride in car without increased pain   Baseline no pain with riding in car.  AROM 105 degrees RT knee flexion.   Time 8   Period Weeks   Status Partially Met   PT LONG TERM GOAL #4   Title  Pt will increase R hip and knee AROM in order to don socks with pain level 2/10 or less   Baseline unable to don sock RT.  Extra time Lt no pain LT   Time 8   Period Weeks   Status Not Met   PT LONG TERM GOAL #5   Title Pt will demonstrate sit to stand form chair without using compensatory motions(blocking knee, momentum) x 5   Baseline 5 reps as above from low mat.   Time 8   Period Weeks   Status Achieved               Problem List Patient Active Problem List   Diagnosis Date Noted  . Loss of weight 08/01/2014  . Coronary atherosclerosis of native coronary artery 10/31/2013  . Incidental lung nodule, > 31m and < 815m Left lower lobe 33m65m June 2014, April 2015 unchanged 06/25/2013  . OSA on CPAP 06/21/2013  . 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 Myalgia and myositis 05/02/2013  . Muscle ache 04/03/2013  . Nocturnal hypoxemia 02/17/2013  . Morbid obesity 01/24/2013  . Hiatal hernia seen on CT 08/24/2012  .  LLL nodule 6mm62mJune 2014,  described in 2003 07/13/2012  . Asthma, chronic 06/29/2012  . Arthralgia 06/02/2012  . Unspecified asthma(493.90) 06/02/2012  . Hypersomnia with sleep apnea, unspecified 03/11/2012  . Other alteration of consciousness 03/11/2012  . Migraine without aura 03/11/2012  . Encounter for long-term (current) use of other medications 12/28/2011  . Dyspnea 12/28/2011  . Screening examination for infectious disease 12/28/2011  . Hx of fibroids 08/03/2011  . Knee pain, left 12/25/2010  . NECK PAIN 12/03/2009  . EDEMA 12/18/2008  . TINNITUS 11/22/2007  . HEARING LOSS 11/22/2007  . KNEE PAIN, RIGHT 11/22/2007  . ASYMPTOMATIC POSTMENOPAUSAL STATUS 11/22/2007  . GOITER, MULTINODULAR 07/20/2007  . GERD 07/20/2007  . NASH (nonalcoholic steatohepatitis) 07/20/2007  . HEADACHE 07/20/2007  . DM (diabetes mellitus), type 2 01/13/2007  . HYPERCHOLESTEROLEMIA 01/13/2007  . ANXIETY 10/22/2006  . Essential hypertension 10/22/2006  . ALLERGIC RHINITIS 10/22/2006  . OSTEOARTHRITIS 10/22/2006    Heather [name] 10/23/2014 10:35 AM Phone: [phone]: [address], [place]: [phone]:  [phone] KareMelvenia NeedlesA 10/23/2014 10:35 AM Phone: [phone]: [phone] THERAPY DISCHARGE SUMMARY  Visits from Start of Care: 11  Current functional level See above   Remaining deficits: Unable to completely don socks , husband helps with right leg   Education / Equipment: HEP Plan: Patient agrees to discharge.  Patient goals were met. Patient is being discharged due to meeting the stated rehab goals.  ?????        Heather [name] 10/23/2014 10:35 AM Phone: [phone]: [phone]

## 2014-12-18 ENCOUNTER — Encounter: Payer: Self-pay | Admitting: Podiatry

## 2014-12-18 ENCOUNTER — Ambulatory Visit (INDEPENDENT_AMBULATORY_CARE_PROVIDER_SITE_OTHER): Payer: Medicare Other | Admitting: Podiatry

## 2014-12-18 VITALS — BP 128/75 | HR 97 | Resp 16

## 2014-12-18 DIAGNOSIS — L603 Nail dystrophy: Secondary | ICD-10-CM

## 2014-12-18 DIAGNOSIS — Q828 Other specified congenital malformations of skin: Secondary | ICD-10-CM

## 2014-12-18 DIAGNOSIS — M79674 Pain in right toe(s): Secondary | ICD-10-CM

## 2014-12-18 DIAGNOSIS — B351 Tinea unguium: Secondary | ICD-10-CM | POA: Diagnosis not present

## 2014-12-18 DIAGNOSIS — I2510 Atherosclerotic heart disease of native coronary artery without angina pectoris: Secondary | ICD-10-CM

## 2014-12-18 DIAGNOSIS — M79675 Pain in left toe(s): Secondary | ICD-10-CM

## 2014-12-18 NOTE — Progress Notes (Signed)
She presents today with a chief complaint of corns and calluses and that her big toenail on her right foot is starting to come loose. She states that part of it popped off the other night at home.  Objective: Vital signs are stable she's alert and oriented 3 pulses are palpable. Her toenails are thick yellow dystrophic onychomycotic and painful to palpation as well as debridement. She has a nail dystrophy to the hallux right resulting in distal onychomycosis and loosening of the nail. Multiple porokeratosis and corns and calluses bilateral foot. No open lesions or wounds.  Assessment: Pain in limb secondary to onychomycosis 1 through 5 bilateral. Pain in limb secondary to poor keratoses bilateral.  Plan: Debridement of all reactive hyperkeratosis porokeratosis bilateral. Follow up with her as needed.

## 2015-01-08 ENCOUNTER — Other Ambulatory Visit: Payer: Self-pay | Admitting: Endocrinology

## 2015-01-31 ENCOUNTER — Ambulatory Visit: Payer: PRIVATE HEALTH INSURANCE | Admitting: Endocrinology

## 2015-02-11 ENCOUNTER — Other Ambulatory Visit: Payer: Self-pay | Admitting: Endocrinology

## 2015-02-11 ENCOUNTER — Encounter: Payer: Self-pay | Admitting: Neurology

## 2015-02-11 ENCOUNTER — Ambulatory Visit (INDEPENDENT_AMBULATORY_CARE_PROVIDER_SITE_OTHER): Payer: Medicare Other | Admitting: Neurology

## 2015-02-11 VITALS — BP 138/88 | HR 94 | Resp 20 | Ht 64.0 in | Wt 274.0 lb

## 2015-02-11 DIAGNOSIS — G4733 Obstructive sleep apnea (adult) (pediatric): Secondary | ICD-10-CM | POA: Diagnosis not present

## 2015-02-11 DIAGNOSIS — G4719 Other hypersomnia: Secondary | ICD-10-CM

## 2015-02-11 DIAGNOSIS — I2510 Atherosclerotic heart disease of native coronary artery without angina pectoris: Secondary | ICD-10-CM | POA: Diagnosis not present

## 2015-02-11 DIAGNOSIS — Z9989 Dependence on other enabling machines and devices: Secondary | ICD-10-CM

## 2015-02-11 DIAGNOSIS — G4710 Hypersomnia, unspecified: Secondary | ICD-10-CM

## 2015-02-11 MED ORDER — MODAFINIL 200 MG PO TABS: 200.0000 mg | ORAL_TABLET | Freq: Every day | ORAL | Status: DC

## 2015-02-11 NOTE — Patient Instructions (Signed)
Modafinil tablets What is this medicine? MODAFINIL (moe DAF i nil) is used to treat excessive sleepiness caused by certain sleep disorders. This includes narcolepsy, sleep apnea, and shift work sleep disorder. This medicine may be used for other purposes; ask your health care provider or pharmacist if you have questions. What should I tell my health care provider before I take this medicine? They need to know if you have any of these conditions: -history of depression, mania, or other mental disorder -kidney disease -liver disease -an unusual or allergic reaction to modafinil, other medicines, foods, dyes, or preservatives -pregnant or trying to get pregnant -breast-feeding How should I use this medicine? Take this medicine by mouth with a glass of water. Follow the directions on the prescription label. Take your doses at regular intervals. Do not take your medicine more often than directed. Do not stop taking except on your doctor's advice. A special MedGuide will be given to you by the pharmacist with each prescription and refill. Be sure to read this information carefully each time. Talk to your pediatrician regarding the use of this medicine in children. This medicine is not approved for use in children. Overdosage: If you think you have taken too much of this medicine contact a poison control center or emergency room at once. NOTE: This medicine is only for you. Do not share this medicine with others. What if I miss a dose? If you miss a dose, take it as soon as you can. If it is almost time for your next dose, take only that dose. Do not take double or extra doses. What may interact with this medicine? Do not take this medicine with any of the following medications: -amphetamine or dextroamphetamine -dexmethylphenidate or methylphenidate -medicines called MAO Inhibitors like Nardil, Parnate, Marplan, Eldepryl -pemoline -procarbazine This medicine may also interact with the following  medications: -antifungal medicines like itraconazole or ketoconazole -barbiturates like phenobarbital -birth control pills or other hormone-containing birth control devices or implants -carbamazepine -cyclosporine -diazepam -medicines for depression, anxiety, or psychotic disturbances -phenytoin -propranolol -triazolam -warfarin This list may not describe all possible interactions. Give your health care provider a list of all the medicines, herbs, non-prescription drugs, or dietary supplements you use. Also tell them if you smoke, drink alcohol, or use illegal drugs. Some items may interact with your medicine. What should I watch for while using this medicine? Visit your doctor or health care professional for regular checks on your progress. The full effects of this medicine may not be seen right away. This medicine may affect your concentration, function, or may hide signs that you are tired. You may get dizzy. Do not drive, use machinery, or do anything that needs mental alertness until you know how this drug affects you. Alcohol can make you more dizzy and may interfere with your response to this medicine or your alertness. Avoid alcoholic drinks. Birth control pills may not work properly while you are taking this medicine. Talk to your doctor about using an extra method of birth control. It is unknown if the effects of this medicine will be increased by the use of caffeine. Caffeine is available in many foods, beverages, and medications. Ask your doctor if you should limit or change your intake of caffeine-containing products while on this medicine. What side effects may I notice from receiving this medicine? Side effects that you should report to your doctor or health care professional as soon as possible: -allergic reactions like skin rash, itching or hives, swelling of the face,  lips, or tongue -anxiety -breathing problems -chest pain -fast, irregular  heartbeat -hallucinations -increased blood pressure -redness, blistering, peeling or loosening of the skin, including inside the mouth -sore throat, fever, or chills -suicidal thoughts or other mood changes -tremors -vomiting Side effects that usually do not require medical attention (report to your doctor or health care professional if they continue or are bothersome): -headache -nausea, diarrhea, or stomach upset -nervousness -trouble sleeping This list may not describe all possible side effects. Call your doctor for medical advice about side effects. You may report side effects to FDA at 1-800-FDA-1088. Where should I keep my medicine? Keep out of the reach of children. This medicine can be abused. Keep your medicine in a safe place to protect it from theft. Do not share this medicine with anyone. Selling or giving away this medicine is dangerous and against the law. This medicine may cause accidental overdose and death if taken by other adults, children, or pets. Mix any unused medicine with a substance like cat litter or coffee grounds. Then throw the medicine away in a sealed container like a sealed bag or a coffee can with a lid. Do not use the medicine after the expiration date. Store at room temperature between 20 and 25 degrees C (68 and 77 degrees F). NOTE: This sheet is a summary. It may not cover all possible information. If you have questions about this medicine, talk to your doctor, pharmacist, or health care provider.    2016, [employer]. (2013-11-07 15:34:55)

## 2015-02-11 NOTE — Progress Notes (Signed)
[facility]  Provider:  Larey Seat, M D  Referring Provider: [name], MD Primary Care Physician:  [name], MD  Chief Complaint  Patient presents with  . Follow-up    cpap, doing well, rm 10, with husband    HPI:  Heather [name] is a 69 y.o. female ,seen here as a revisit after her sleep study.     Heather [name], a  69 year old right-handed [place] American female , is a former patient of Dr. Elvia Collum and is followed now by Dr. Floyde Parkins here at [facility]. The patient's pulmonologist, Dr. Chase Caller, recommended a review of her sleep breathing disorder in light of a new  diagnosis of nocturnal hypoxemia. The patient desaturates according to her her husband and from recollection of ONO data  into the 60s and 70s at night ( as documented on a home based  pulse oximetry). Cardiologist evaluation  found hypoxemia . The patient started per cardiologist on Losartin and ASA. She was referred by Dr. Doy Mince in June 2011 for a sleep study based on chief complaints of snoring, witnessed apneas and excessive daytime sleepiness. At the time the patient endorsed  the Epworth sleepiness score at a very high count of 20/24 points and the Beck's inventory at 3 points, her BMI was 49.9, and her neck conference measured 14.5 inches . She was diagnosed with OSA after her study revealed an AHI of 11.7 (which constitutes mild apnea). During REM sleep, her AHI increased to 68.2/hr.  The oxygen  desaturations associated with REM sleep in supine sleep position  were suspected to be a cause for the patient's excessive daytime sleepiness and the reported morning headaches. The patient had reported at that time that she would prefer to sleep up sitting in a chair or recliner.I saw Heather [name] last she has been compliantly using her BiPAP machine.  she underwent a split night polysomnography on 12-13-12 and had endorsed the Epworth sleepiness score at  21 points/  her neck circumference was 14.5 inches/  BMI 48.4.  The  patient had an AHI of 46.8 which constitutes severe apnea RDI was 48.7/ hr . The Co2 retention was not found (by a CMS criteria) but she was at a level of 49.4 torr, very close to the threshold of 50 Torr. [name] Respiratory events were hypopneas and not frank apneas. Oxygen lowest saturation was 75% but 110 minutes of desaturation during the baseline part of this but study. The patient was titrated to a BiPAP setting of 11/7 cm water under which she slept 120 minutes and reached finally REM sleep. The patient tolerated BiPAP much better than CPAP , an ESON  medium size nasal mask was used . She had prolonged hypoxemia persisted in spite of CPAP and BiPAP use at various times during the night -02  nadir of 78% during the in lab titration.   06-21-13  The last compliance data for a 30 day download were dated 04-07-13. The patient's compliance was 90%, average time of CPAP or BiPAP use was 5 hours and 43 minutes. The patient uses a BiPAP machine a so-called easy breath ,  inspiratory pressure support of 11 cm of water and expiratory pressure of 7 cm. The patient has an AHI of 0.9, she is compliant  By CMS criteria .  I was able to compared downloads from [facility] beginning November 2014 to our in office data. The patient is using BiPAP  machine and has an  inspiratory pressure of 11 cm and expiratory pressure of 7 cm water.  She has less nocturia and less dry mouth in the morning.   Taking Lasix to late will cause her nocturia. She is in PT .  02-12-14 Heather [name] compliance reports dating from 66-13 through 02-10-14 shows 100% compliance 4 days of use and 93% compliance for over 4 hours of daily use. Average use of the machine is 5 hours and 12 minutes. The IPAP is set at 11 cm and the expiratory pressure at 7 cm AHI residual at 0.9 with excellent she has a moderate air leak but it does not seem to influence the overall apnea count  I would not like to change any of the settings I would offer to change the mask should the patient feels that there is a problem with the fit.  FSS 49 and Epworth 15 , still elevated, BMI still severely elevated.   Sleep clinic 02-11-15, The patient has a CPAP and an oxygen concentrator available to her. She had a lot of congestion and upper airway irritation over the last week and felt that it helped her to be less air hungry. She has an excellent report on compliance she's 100% compliance for the last 30 days, 24 of those days was over 4 hours of consecutive use. She uses an inspiratory pressure of 11 expiratory pressure of 7 cm water BiPAP is a residual AHI of 1.3. She has mild to moderate air leaks. There is no reason to change any settings at this time. She still endorsed the Epworth sleepiness score today at 19 points but attributes this also to her current struggle with an upper airway infection.  I am concerned that she is sleepy to this degree by using her CPAP compliantly. I offered modafinil to treat the sleepiness and daytime medically but she declined. 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 .     Review of Systems: Out of a complete 14 system review, the patient complains of only the following symptoms, and all other reviewed systems are negative. The patient has SOB, morbidly obese, has CHF and ankle edema.  Epworth is  19 from 15 in 2015 and   from 20 pre CPAP,  FSS  Is 50 from 49 from 55   GDS at 2 points ,  No falls.     Social History   Social History  . Marital Status: Married    Spouse Name: [name]  . Number of Children: 2  . Years of Education: College   Occupational History  . Retired    Social History Main Topics  . Smoking status: Former  Smoker -- 2.00 packs/day for 20 years    Types: Cigarettes    Quit date: 03/03/1979  . Smokeless tobacco: Never Used  . Alcohol Use: No  . Drug Use: No  . Sexual Activity: Yes    Birth Control/ Protection: Surgical   Other Topics Concern  . Not on file   Social History Narrative   Patient is married [name]).   Patient has two children.   Patient is retired.   Patient has a college education.   Patient is right-handed.   Patient lives at home with family.   Caffeine Use: 2 soda every  other day    Family History  Problem Relation Age of Onset  . Cancer Mother     Breast Cancer, Colon Cancer  . Asthma Mother   . Depression Mother   . Bipolar disorder Mother   . Dementia Mother   . Arthritis Mother   . Hypertension Father   . Migraines Father   . Cancer Maternal Aunt   . Heart disease Maternal Grandmother   . Cancer Maternal Grandfather     Past Medical History  Diagnosis Date  . ALLERGIC RHINITIS 10/22/2006  . ANEMIA 12/18/2008  . ASYMPTOMATIC POSTMENOPAUSAL STATUS 11/22/2007  . DIABETES MELLITUS, TYPE II 01/13/2007  . GERD 07/20/2007  . GOITER, MULTINODULAR 07/20/2007  . Headache(784.0) 07/20/2007  . HEARING LOSS 11/22/2007  . HYPERCHOLESTEROLEMIA 01/13/2007  . HYPERTENSION 10/22/2006  . OSTEOARTHRITIS 10/22/2006  . Other chronic nonalcoholic liver disease 9/93/5701  . Hyperglycemia   . NASH (nonalcoholic steatohepatitis)   . Fibroid   . Hx of colposcopy with cervical biopsy   . Abnormal Pap smear   . Complication of anesthesia   . Dyslipidemia   . Obesity   . Obstructive sleep apnea   . Degenerative arthritis   . Nocturnal hypoxemia 02/17/2013    Past Surgical History  Procedure Laterality Date  . Esophagogastroduodenoscopy  12/08/2005  . Stress cardiolite  10/21/2005  . Electrocardiogram  10/15/2006  . Dexa  08/2005  . Wisdom tooth extraction    . Breast surgery      Breast reduction  . Sweat gland removal    . Dilation and curettage of uterus    .  Eye surgery      Current Outpatient Prescriptions  Medication Sig Dispense Refill  . acetaminophen (TYLENOL) 500 MG tablet Take 500 mg by mouth 3 (three) times daily as needed for pain.    [name] ALLEGRA-D ALLERGY & CONGESTION 180-240 MG 24 hr tablet TAKE 1 TABLET BY MOUTH DAILY 30 tablet 0  . Calcium Carbonate-Vitamin D (CALCIUM 600 + D PO) Take 2 tablets by mouth daily.      . clopidogrel (PLAVIX) 75 MG tablet TAKE 1 TABLET EVERY DAY 90 tablet 3  . Ferrous Sulfate (IRON) 325 (65 FE) MG TABS Take 1 tablet by mouth daily.      . furosemide (LASIX) 20 MG tablet TAKE 1 TABLET EVERY DAY 90 tablet 3  . glucosamine-chondroitin 500-400 MG tablet Take 1 tablet by mouth 2 (two) times daily.      [name] losartan (COZAAR) 25 MG tablet TAKE 1 TABLET EVERY DAY 90 tablet 3  . metFORMIN (GLUCOPHAGE-XR) 500 MG 24 hr tablet Take 2 tablets (1,000 mg total) by mouth 2 (two) times daily. (Patient taking differently: Take 1,000 mg by mouth daily. ) 360 tablet 3  . Multiple Vitamin (MULTIVITAMIN) tablet Take 1 tablet by mouth daily.      . Nutritional Supplements (PEPTAMEN 1.5 PO) Take by mouth 2 (two) times daily.    [name] omeprazole (PRILOSEC) 20 MG capsule Take 20 mg by mouth daily as needed.     . potassium chloride SA (K-DUR,KLOR-CON) 20 MEQ tablet Take 1 tablet (20 mEq total) by mouth daily. 90 tablet 3  . rosuvastatin (CRESTOR) 40 MG tablet TAKE 1 TABLET EVERY DAY 90 tablet 3  . SF 5000 PLUS 1.1 % CREA dental cream   0  . SUMAtriptan (IMITREX) 20 MG/ACT nasal spray Place 1 spray (20 mg total) into the nose every 2 (two) hours as needed for migraine. 12 Inhaler 1  .  topiramate (TOPAMAX) 100 MG tablet Take 1 tablet (100 mg total) by mouth daily. 90 tablet 3  . vitamin E 200 UNIT capsule Take 200 Units by mouth daily.     No current facility-administered medications for this visit.    Allergies as of 02/11/2015 - Review Complete 02/11/2015  Allergen Reaction Noted  . Aspirin  08/04/2011  . Coconut oil  07/10/2013  .  Latex  08/03/2011  . Lisinopril  06/03/2006  . Metoprolol Itching 02/12/2014  . Peanut-containing drug products  07/10/2013  . Penicillins  06/03/2006  . Prednisone  07/12/2012  . Strawberry extract  07/10/2013    Vitals: BP 138/88 mmHg  Pulse 94  Resp 20  Ht 5' 4"  (1.626 m)  Wt 274 lb (124.286 kg)  BMI 47.01 kg/m2 Last Weight:  Wt Readings from Last 1 Encounters:  02/11/15 274 lb (124.286 kg)   Last Height:   Ht Readings from Last 1 Encounters:  02/11/15 5' 4"  (1.626 m)    Physical exam:  General: The patient is awake, alert and appears not in acute distress. The patient is well groomed. Head: Normocephalic, atraumatic.  Neck is supple. Mallampati 3,  With swollen and red uvula , lateral restriction of the upper air way through tonsillary swelling..  Neck circumference: 15.0 , no nasal deviation but slightly nasal voice.  Cardiovascular:  Regular rate and rhythm , without  murmurs or carotid bruit, and without distended neck veins. Respiratory: Lungs are clear to auscultation. Skin:  With evidence of  Ankle edema 2 plus , no rash. Trunk: The patient has normal posture.  Neurologic exam : The patient is awake and alert, oriented to place and time.  Memory subjective described as impaired for short term memory .  There is a normal attention span & concentration ability. She is very sleepy when not stimulated.  Speech is fluent without  dysarthria, dysphonia or aphasia. Mood and affect are appropriate.  Cranial nerves: No loss of taste and smell, Pupils are equal and briskly reactive to light. Funduscopic exam without  evidence of pallor or edema. She is status post cataract ,  Extraocular movements  in vertical and horizontal planes intact and without nystagmus. Visual fields by finger perimetry are intact. Hearing to finger rub intact.  Facial sensation intact to fine touch. Facial motor strength is symmetric and tongue and uvula move midline.  Motor exam:  Reduced  muscle tone, no rigor, and no cog-wheeling.  The patient is morbidly obese - the muscle bulk is difficult to assess-  Evidence of symmetrically reduced proximal muscle strength in all extremities. (Thigh- upper arm)   Coordination: Rapid alternating movements in the fingers/hands is tested and normal.  Finger-to-nose maneuver tested and normal without evidence of ataxia, dysmetria or tremor.  Gait and station: Patient walks without assistive device - her stance is wide based.  Tandem gait is fragmented. She has trouble turning, has  trouble managing stairs , one foot at a time.   The patient walks deliberately slow and measured.  Reported no fallls in 30 month.   Deep tendon reflexes: in the upper and lower extremities are symmetrically attenuated.  Babinski maneuver response is  downgoing.   Assessment:  25 minute RV with more than 50% of the face to face time dedicated to coordination of care, answering questions and establishing that current therapy is still the patient's best option.  This patient has a high compliance to CPAP, her OSA is excellently controlled, yet her Epworth score is risen  to 19 points.  After physical and neurologic examination, review of sleep studies, and pre-existing records, assessment is that of a patient with morbid obesity , morning headaches and known OSA , she finally lost some weight .  Her headaches have been treated by Dr Jannifer Franklin.  She has documented Edema, SOB and ONO documented hypoxia at night.   Plan:  Treatment plan and additional workup :   She  has been using an oxygen concentrator based on January 2015 ONO result with 2 .21 hours of hypoxemia while on PAP, since then she noted  improvement in Epworth and FSS.  Rv every 12 month sleep clinic with PAP machine.  I have ordered modafinil for prn use.    Her headaches have resolved on CPAP and she has seen Cecille Rubin with  Dr Jannifer Franklin, her primary neurologist for these last in September 2015 .   She is to see him next week.     Oddie Bottger, MD

## 2015-02-14 ENCOUNTER — Other Ambulatory Visit: Payer: Self-pay | Admitting: Endocrinology

## 2015-02-14 ENCOUNTER — Encounter: Payer: Self-pay | Admitting: Nurse Practitioner

## 2015-02-14 ENCOUNTER — Ambulatory Visit (INDEPENDENT_AMBULATORY_CARE_PROVIDER_SITE_OTHER): Payer: Medicare Other | Admitting: Nurse Practitioner

## 2015-02-14 VITALS — BP 123/77 | HR 91 | Ht 64.5 in | Wt 278.2 lb

## 2015-02-14 DIAGNOSIS — G43009 Migraine without aura, not intractable, without status migrainosus: Secondary | ICD-10-CM

## 2015-02-14 DIAGNOSIS — E662 Morbid (severe) obesity with alveolar hypoventilation: Secondary | ICD-10-CM

## 2015-02-14 DIAGNOSIS — Z9989 Dependence on other enabling machines and devices: Secondary | ICD-10-CM

## 2015-02-14 DIAGNOSIS — R0902 Hypoxemia: Secondary | ICD-10-CM | POA: Diagnosis not present

## 2015-02-14 DIAGNOSIS — I2510 Atherosclerotic heart disease of native coronary artery without angina pectoris: Secondary | ICD-10-CM

## 2015-02-14 DIAGNOSIS — G4733 Obstructive sleep apnea (adult) (pediatric): Secondary | ICD-10-CM

## 2015-02-14 MED ORDER — TOPIRAMATE 100 MG PO TABS: 100.0000 mg | ORAL_TABLET | Freq: Every day | ORAL | Status: DC

## 2015-02-14 NOTE — Progress Notes (Signed)
I have read the note, and I agree with the clinical assessment and plan.  WILLIS,CHARLES KEITH   

## 2015-02-14 NOTE — Patient Instructions (Signed)
Continue Topamax at current dose will refill Imitrex acutely prn F/U yearly and prn

## 2015-02-14 NOTE — Progress Notes (Signed)
[facility]  PATIENT: Heather [name] DOB: 1945-05-16   REASON FOR VISIT: follow-up for migraine, persistent hypersomnia, obstructive sleep apnea on CPAP,  HISTORY FROM:patient    HISTORY OF PRESENT ILLNESSMs. Heather [name] is a 69 year old right-handed black female with a history of migraine headache. The patient has done quite well on Topamax as a preventive and Imitrex spray acutely which she rarely uses. The patient indicates she cannot remember when she had her last migraine. She has also been evaluated by Dr. Brett Fairy and has obstructive sleep apnea and uses BiPAP. 11 cm. The patient reports no other new medical issues that have come up since last seen. The patient's pulmonologist, Dr. Chase Caller, recommended a review of her sleep breathing disorder in light of a new diagnosis of nocturnal hypoxemia. The patient desaturates according to her her husband and from recollection of ONO data into the 60s and 70s at night ( as documented on a home based pulse oximetry). Cardiologist evaluation found hypoxemia . The patient started per cardiologist on Losartin and ASA.She was just seen in the office by Dr. Brett Fairy 3 days ago, she returns for reevaluation  Sleep clinic 02-11-15, The patient has a CPAP and an oxygen concentrator available to her. She had a lot of congestion and upper airway irritation over the last week and felt that it helped her to be less air hungry. She has an excellent report on compliance she's 100% compliance for the last 30 days, 24 of those days was over 4 hours of consecutive use. She uses an inspiratory pressure of 11 expiratory pressure of 7 cm water BiPAP is a residual AHI of 1.3. She has mild to moderate air leaks. There is no reason to change any settings at this time. She still endorsed the Epworth sleepiness score today at 19 points but attributes this also to her current struggle with an upper airway infection.  I am concerned that she is sleepy  to this degree by using her CPAP compliantly. I offered modafinil to treat the sleepiness and daytime medically but she declined. 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 Heather [name]      REVIEW OF SYSTEMS: Full 14 system review of systems performed and notable only for those listed, all others are neg:  Constitutional: neg  Cardiovascular: neg Ear/Nose/Throat: neg  Skin: neg Eyes: neg Respiratory: neg Gastroitestinal: urinary frequency Hematology/Lymphatic: neg  Endocrine: neg Musculoskeletal:occasional muscle cramps Allergy/Immunology: Food allergies Neurological: history of migraine Psychiatric: neg Sleep : obstructive sleep apnea   ALLERGIES: Allergies  Allergen Reactions  . Aspirin   . Coconut Oil   . Latex   . Lisinopril     REACTION: cough  . Metoprolol Itching  . Peanut-Containing Drug Products   . Penicillins     REACTION: rash  . Prednisone   . Strawberry Extract     HOME MEDICATIONS: Outpatient Prescriptions Prior to Visit  Medication Sig Dispense Refill  . acetaminophen (TYLENOL) 500 MG tablet Take 500 mg by mouth 3 (three) times daily as needed for pain.    . Calcium Carbonate-Vitamin D (CALCIUM 600 + D PO) Take 2 tablets by mouth daily.      . clopidogrel (PLAVIX) 75 MG tablet TAKE 1 TABLET EVERY DAY 90 tablet 3  . Ferrous Sulfate (  IRON) 325 (65 FE) MG TABS Take 1 tablet by mouth daily.      . furosemide (LASIX) 20 MG tablet TAKE 1 TABLET EVERY DAY 90 tablet 3  . glucosamine-chondroitin 500-400 MG tablet Take 1 tablet by mouth 2 (two) times daily.      Heather [name] losartan (COZAAR) 25 MG tablet TAKE 1 TABLET EVERY DAY 90 tablet 3  . metFORMIN (GLUCOPHAGE-XR) 500 MG 24 hr tablet Take 2 tablets (1,000 mg total) by mouth 2 (two) times daily. (Patient taking  differently: Take 1,000 mg by mouth daily. ) 360 tablet 3  . Multiple Vitamin (MULTIVITAMIN) tablet Take 1 tablet by mouth daily.      . Nutritional Supplements (PEPTAMEN 1.5 PO) Take by mouth 2 (two) times daily.    Heather [name] omeprazole (PRILOSEC) 20 MG capsule Take 20 mg by mouth daily as needed.     . potassium chloride SA (K-DUR,KLOR-CON) 20 MEQ tablet Take 1 tablet (20 mEq total) by mouth daily. 90 tablet 3  . rosuvastatin (CRESTOR) 40 MG tablet TAKE 1 TABLET EVERY DAY 90 tablet 3  . SF 5000 PLUS 1.1 % CREA dental cream   0  . SUMAtriptan (IMITREX) 20 MG/ACT nasal spray Place 1 spray (20 mg total) into the nose every 2 (two) hours as needed for migraine. 12 Inhaler 1  . topiramate (TOPAMAX) 100 MG tablet Take 1 tablet (100 mg total) by mouth daily. 90 tablet 3  . vitamin E 200 UNIT capsule Take 200 Units by mouth daily.    Heather [name] ALLERGY & CONGESTION 180-240 MG 24 hr tablet TAKE 1 TABLET BY MOUTH DAILY 30 tablet 0  . modafinil (PROVIGIL) 200 MG tablet Take 1 tablet (200 mg total) by mouth daily. (Patient not taking: Reported on 02/14/2015) 30 tablet 1   No facility-administered medications prior to visit.    PAST MEDICAL HISTORY: Past Medical History  Diagnosis Date  . ALLERGIC RHINITIS 10/22/2006  . ANEMIA 12/18/2008  . ASYMPTOMATIC POSTMENOPAUSAL STATUS 11/22/2007  . DIABETES MELLITUS, TYPE II 01/13/2007  . GERD 07/20/2007  . GOITER, MULTINODULAR 07/20/2007  . Headache(784.0) 07/20/2007  . HEARING LOSS 11/22/2007  . HYPERCHOLESTEROLEMIA 01/13/2007  . HYPERTENSION 10/22/2006  . OSTEOARTHRITIS 10/22/2006  . Other chronic nonalcoholic liver disease 1/95/0932  . Hyperglycemia   . NASH (nonalcoholic steatohepatitis)   . Fibroid   . Hx of colposcopy with cervical biopsy   . Abnormal Pap smear   . Complication of anesthesia   . Dyslipidemia   . Obesity   . Obstructive sleep apnea   . Degenerative arthritis   . Nocturnal hypoxemia 02/17/2013    PAST SURGICAL HISTORY: Past Surgical  History  Procedure Laterality Date  . Esophagogastroduodenoscopy  12/08/2005  . Stress cardiolite  10/21/2005  . Electrocardiogram  10/15/2006  . Dexa  08/2005  . Wisdom tooth extraction    . Breast surgery      Breast reduction  . Sweat gland removal    . Dilation and curettage of uterus    . Eye surgery      FAMILY HISTORY: Family History  Problem Relation Age of Onset  . Cancer Mother     Breast Cancer, Colon Cancer  . Asthma Mother   . Depression Mother   . Bipolar disorder Mother   . Dementia Mother   . Arthritis Mother   . Hypertension Father   . Migraines Father   . Cancer Maternal Aunt   . Heart disease Maternal Grandmother   . Cancer Maternal Grandfather  SOCIAL HISTORY: Social History   Social History  . Marital Status: Married    Spouse Name: [name]  . Number of Children: 2  . Years of Education: College   Occupational History  . Retired    Social History Main Topics  . Smoking status: Former Smoker -- 2.00 packs/day for 20 years    Types: Cigarettes    Quit date: 03/03/1979  . Smokeless tobacco: Never Used  . Alcohol Use: No  . Drug Use: No  . Sexual Activity: Yes    Birth Control/ Protection: Surgical   Other Topics Concern  . Not on file   Social History Narrative   Patient is married [name]).   Patient has two children.   Patient is retired.   Patient has a college education.   Patient is right-handed.   Patient lives at home with family.   Caffeine Use: 2 soda every other day     PHYSICAL EXAM  Filed Vitals:   02/14/15 1018  BP: 123/77  Pulse: 91  Height: 5' 4.5" (1.638 m)  Weight: 278 lb 3.2 oz (126.191 kg)   Body mass index is 47.03 kg/(m^2). General: The patient is alert and cooperative at the time of the examination. The patient is moderately to markedly obese. Skin: No significant peripheral edema is noted. Neurologic Exam Mental status: The patient is oriented x 3. Cranial nerves: Facial symmetry is present.  Speech is normal, no aphasia or dysarthria is noted. Extraocular movements are full. Visual fields are full. Motor: The patient has good strength in all 4 extremities. Sensory examination: Soft touch sensation is symmetric on the face, arms, and legs. Coordination: The patient has good finger-nose-finger and heel-to-shin bilaterally. Gait and station: The patient has a wide based gait. Tandem gait is slightly unsteady. Romberg is negative. No drift is seen. Reflexes: Deep tendon reflexes are symmetric.    DIAGNOSTIC DATA (LABS, IMAGING, TESTING) - I reviewed patient records, labs, notes, testing and imaging myself where available.  Lab Results  Component Value Date   WBC 5.1 08/01/2014   HGB 12.4 08/01/2014   HCT 39.1 08/01/2014   MCV 86.7 08/01/2014   PLT 259.0 08/01/2014      Component Value Date/Time   NA 139 08/01/2014 1138   K 3.4* 08/01/2014 1138   CL 103 08/01/2014 1138   CO2 31 08/01/2014 1138   GLUCOSE 102* 08/01/2014 1138   BUN 12 08/01/2014 1138   CREATININE 0.91 08/01/2014 1138   CREATININE 0.81 12/28/2011 0853   CALCIUM 9.4 08/01/2014 1138   PROT 7.5 01/30/2014 1210   ALBUMIN 3.4* 01/30/2014 1210   AST 18 01/30/2014 1210   ALT 11 01/30/2014 1210   ALKPHOS 70 01/30/2014 1210   BILITOT 0.3 01/30/2014 1210   GFRNONAA 94.49 12/13/2009 0908   GFRAA 94 11/11/2007 0912    Lab Results  Component Value Date   HGBA1C 6.3 08/01/2014    Lab Results  Component Value Date   TSH 1.26 08/01/2014      ASSESSMENT AND PLAN  69 y.o. year old female  has a past medical history of  Headache(784.0) (07/20/2007); HEARING LOSS (11/22/2007);  Obesity; Obstructive sleep apnea; Degenerative arthritis; and Nocturnal hypoxemia (02/17/2013). Migraine headaches here to follow-up  Continue Topamax at current dose will refill Imitrex acutely prn F/U yearly and prn Dennie Bible, [facility], [facility], APRN  [facility] [address], [place], [place]  [address] [phone]

## 2015-02-15 ENCOUNTER — Telehealth: Payer: Self-pay | Admitting: Endocrinology

## 2015-02-15 ENCOUNTER — Ambulatory Visit (INDEPENDENT_AMBULATORY_CARE_PROVIDER_SITE_OTHER): Payer: Medicare Other | Admitting: Endocrinology

## 2015-02-15 ENCOUNTER — Encounter: Payer: Self-pay | Admitting: Endocrinology

## 2015-02-15 ENCOUNTER — Other Ambulatory Visit: Payer: Self-pay | Admitting: Endocrinology

## 2015-02-15 ENCOUNTER — Ambulatory Visit
Admission: RE | Admit: 2015-02-15 | Discharge: 2015-02-15 | Disposition: A | Discharge status: Home / Self Care | Payer: Medicare Other | Source: Ambulatory Visit | Attending: Endocrinology | Admitting: Endocrinology

## 2015-02-15 VITALS — BP 134/83 | HR 86 | Temp 97.5°F | Ht 64.5 in | Wt 276.0 lb

## 2015-02-15 DIAGNOSIS — M159 Polyosteoarthritis, unspecified: Secondary | ICD-10-CM

## 2015-02-15 DIAGNOSIS — I10 Essential (primary) hypertension: Secondary | ICD-10-CM

## 2015-02-15 DIAGNOSIS — K7581 Nonalcoholic steatohepatitis (NASH): Secondary | ICD-10-CM | POA: Diagnosis not present

## 2015-02-15 DIAGNOSIS — E042 Nontoxic multinodular goiter: Secondary | ICD-10-CM | POA: Diagnosis not present

## 2015-02-15 DIAGNOSIS — E7800 Pure hypercholesterolemia, unspecified: Secondary | ICD-10-CM | POA: Diagnosis not present

## 2015-02-15 DIAGNOSIS — R05 Cough: Secondary | ICD-10-CM

## 2015-02-15 DIAGNOSIS — Z23 Encounter for immunization: Secondary | ICD-10-CM

## 2015-02-15 DIAGNOSIS — Z794 Long term (current) use of insulin: Secondary | ICD-10-CM | POA: Diagnosis not present

## 2015-02-15 DIAGNOSIS — N951 Menopausal and female climacteric states: Secondary | ICD-10-CM

## 2015-02-15 DIAGNOSIS — R059 Cough, unspecified: Secondary | ICD-10-CM

## 2015-02-15 DIAGNOSIS — E119 Type 2 diabetes mellitus without complications: Secondary | ICD-10-CM

## 2015-02-15 DIAGNOSIS — M150 Primary generalized (osteo)arthritis: Secondary | ICD-10-CM

## 2015-02-15 DIAGNOSIS — I2510 Atherosclerotic heart disease of native coronary artery without angina pectoris: Secondary | ICD-10-CM

## 2015-02-15 DIAGNOSIS — K219 Gastro-esophageal reflux disease without esophagitis: Secondary | ICD-10-CM | POA: Diagnosis not present

## 2015-02-15 LAB — LIPID PANEL
CHOL/HDL RATIO: 3
CHOLESTEROL: 144 mg/dL (ref 0–200)
HDL: 56.2 mg/dL (ref >39.00)
LDL Cholesterol: 71 mg/dL (ref 0–99)
NonHDL: 87.65
Triglycerides: 82 mg/dL (ref 0.0–149.0)
VLDL: 16.4 mg/dL (ref 0.0–40.0)

## 2015-02-15 LAB — CBC WITH DIFFERENTIAL/PLATELET
BASOS PCT: 0.4 % (ref 0.0–3.0)
Basophils Absolute: 0 10*3/uL (ref 0.0–0.1)
Eosinophils Absolute: 0.1 10*3/uL (ref 0.0–0.7)
Eosinophils Relative: 1.9 % (ref 0.0–5.0)
HCT: 38.6 % (ref 36.0–46.0)
Hemoglobin: 12.3 g/dL (ref 12.0–15.0)
Lymphocytes Relative: 25.8 % (ref 12.0–46.0)
Lymphs Abs: 1.4 10*3/uL (ref 0.7–4.0)
MCHC: 31.9 g/dL (ref 30.0–36.0)
MCV: 87.9 fl (ref 78.0–100.0)
MONO ABS: 0.4 10*3/uL (ref 0.1–1.0)
Monocytes Relative: 8.4 % (ref 3.0–12.0)
Neutro Abs: 3.4 10*3/uL (ref 1.4–7.7)
Neutrophils Relative %: 63.5 % (ref 43.0–77.0)
Platelets: 255 10*3/uL (ref 150.0–400.0)
RBC: 4.39 Mil/uL (ref 3.87–5.11)
RDW: 17.9 % — AB (ref 11.5–15.5)
WBC: 5.3 10*3/uL (ref 4.0–10.5)

## 2015-02-15 LAB — URINALYSIS, ROUTINE W REFLEX MICROSCOPIC
Bilirubin Urine: NEGATIVE
Hgb urine dipstick: NEGATIVE
Ketones, ur: NEGATIVE
Leukocytes, UA: NEGATIVE
Nitrite: NEGATIVE
RBC / HPF: NONE SEEN (ref 0–?)
SPECIFIC GRAVITY, URINE: 1.02 (ref 1.000–1.030)
Total Protein, Urine: NEGATIVE
Urine Glucose: NEGATIVE
Urobilinogen, UA: 0.2 (ref 0.0–1.0)
pH: 8 (ref 5.0–8.0)

## 2015-02-15 LAB — MICROALBUMIN / CREATININE URINE RATIO
CREATININE, U: 161.3 mg/dL
MICROALB UR: 1.2 mg/dL (ref 0.0–1.9)
Microalb Creat Ratio: 0.7 mg/g (ref 0.0–30.0)

## 2015-02-15 LAB — HEPATIC FUNCTION PANEL
ALBUMIN: 3.7 g/dL (ref 3.5–5.2)
ALT: 14 U/L (ref 0–35)
AST: 19 U/L (ref 0–37)
Alkaline Phosphatase: 67 U/L (ref 39–117)
Bilirubin, Direct: 0.1 mg/dL (ref 0.0–0.3)
TOTAL PROTEIN: 8 g/dL (ref 6.0–8.3)
Total Bilirubin: 0.3 mg/dL (ref 0.2–1.2)

## 2015-02-15 LAB — BASIC METABOLIC PANEL
BUN: 9 mg/dL (ref 6–23)
CALCIUM: 9.3 mg/dL (ref 8.4–10.5)
CO2: 30 mEq/L (ref 19–32)
CREATININE: 0.78 mg/dL (ref 0.40–1.20)
Chloride: 102 mEq/L (ref 96–112)
GFR: 94.37 mL/min (ref >60.00)
Glucose, Bld: 98 mg/dL (ref 70–99)
POTASSIUM: 3.8 meq/L (ref 3.5–5.1)
Sodium: 139 mEq/L (ref 135–145)

## 2015-02-15 LAB — POCT GLYCOSYLATED HEMOGLOBIN (HGB A1C): Hemoglobin A1C: 6

## 2015-02-15 LAB — TSH: TSH: 1.86 u[IU]/mL (ref 0.35–4.50)

## 2015-02-15 MED ORDER — ROSUVASTATIN CALCIUM 40 MG PO TABS: ORAL_TABLET | ORAL | Status: DC

## 2015-02-15 MED ORDER — AZITHROMYCIN 500 MG PO TABS: 500.0000 mg | ORAL_TABLET | Freq: Every day | ORAL | Status: DC

## 2015-02-15 MED ORDER — PROMETHAZINE-CODEINE 6.25-10 MG/5ML PO SYRP: 5.0000 mL | ORAL_SOLUTION | ORAL | Status: DC | PRN

## 2015-02-15 MED ORDER — FUROSEMIDE 40 MG PO TABS: 40.0000 mg | ORAL_TABLET | Freq: Every day | ORAL | Status: DC

## 2015-02-15 MED ORDER — FLUTICASONE-SALMETEROL 100-50 MCG/DOSE IN AEPB: 1.0000 | INHALATION_SPRAY | Freq: Two times a day (BID) | RESPIRATORY_TRACT | Status: DC

## 2015-02-15 MED ORDER — FEXOFENADINE-PSEUDOEPHED ER 180-240 MG PO TB24
1.0000 | ORAL_TABLET | Freq: Every day | ORAL | Status: DC
Start: 2015-02-15 — End: 2015-02-21

## 2015-02-15 NOTE — Telephone Encounter (Signed)
Pt would like a 30 day supply sent to [facility] [address] please

## 2015-02-15 NOTE — Patient Instructions (Addendum)
blood tests, and a chest x-ray, are requested for you today.  We'll let you know about the results. i have sent 2 prescriptions to your pharmacy: (antibiotic pill, and inhaler) Loratadine-d (non-prescription) will help your congestion.  Here is a prescription for cough syrup.   please consider these measures for your health:  minimize alcohol.  do not use tobacco products.  have a colonoscopy at least every 10 years from age 68.  Women should have an annual mammogram from age 68.  keep firearms safely stored.  always use seat belts.  have working smoke alarms in your home.  see an eye doctor and dentist regularly.  never drive under the influence of alcohol or drugs (including prescription drugs).   it is critically important to prevent falling down (keep floor areas well-lit, dry, and free of loose objects.  If you have a cane, walker, or wheelchair, you should use it, even for short trips around the house.  Also, try not to rush). Please come back for a follow-up appointment in 6 months.

## 2015-02-15 NOTE — Progress Notes (Signed)
we discussed code status.  pt says she'll decide at a later date.

## 2015-02-15 NOTE — Progress Notes (Signed)
Subjective:    Patient ID: Heather [name], female    DOB: 1945-12-27, 69 y.o.   MRN: [id number]  HPI The state of at least three ongoing medical problems is addressed today, with interval history of each noted here: Pt returns for f/u of diabetes mellitus: DM type: 2 Dx'ed: 4098 Complications: none Therapy: metformin.  GDM: never DKA: never Severe hypoglycemia: never Pancreatitis: never Other: she does not take actos, due to edema; pt says she was told by surgeon she was not a good candidate for weight loss surgery, due to abd hernia. Interval history: Pt states 2 weeks of moderate dry-quality cough in the chest, and assoc nasal congestion and wheezing.  Past Medical History  Diagnosis Date  . ALLERGIC RHINITIS 10/22/2006  . ANEMIA 12/18/2008  . ASYMPTOMATIC POSTMENOPAUSAL STATUS 11/22/2007  . DIABETES MELLITUS, TYPE II 01/13/2007  . GERD 07/20/2007  . GOITER, MULTINODULAR 07/20/2007  . Headache(784.0) 07/20/2007  . HEARING LOSS 11/22/2007  . HYPERCHOLESTEROLEMIA 01/13/2007  . HYPERTENSION 10/22/2006  . OSTEOARTHRITIS 10/22/2006  . Other chronic nonalcoholic liver disease 03/20/1476  . Hyperglycemia   . NASH (nonalcoholic steatohepatitis)   . Fibroid   . Hx of colposcopy with cervical biopsy   . Abnormal Pap smear   . Complication of anesthesia   . Dyslipidemia   . Obesity   . Obstructive sleep apnea   . Degenerative arthritis   . Nocturnal hypoxemia 02/17/2013    Past Surgical History  Procedure Laterality Date  . Esophagogastroduodenoscopy  12/08/2005  . Stress cardiolite  10/21/2005  . Electrocardiogram  10/15/2006  . Dexa  08/2005  . Wisdom tooth extraction    . Breast surgery      Breast reduction  . Sweat gland removal    . Dilation and curettage of uterus    . Eye surgery      Social History   Social History  . Marital Status: Married    Spouse Name: [name]  . Number of Children: 2  . Years of Education: College   Occupational History  . Retired      Social History Main Topics  . Smoking status: Former Smoker -- 2.00 packs/day for 20 years    Types: Cigarettes    Quit date: 03/03/1979  . Smokeless tobacco: Never Used  . Alcohol Use: No  . Drug Use: No  . Sexual Activity: Yes    Birth Control/ Protection: Surgical   Other Topics Concern  . Not on file   Social History Narrative   Patient is married [name]).   Patient has two children.   Patient is retired.   Patient has a college education.   Patient is right-handed.   Patient lives at home with family.   Caffeine Use: 2 soda every other day    Current Outpatient Prescriptions on File Prior to Visit  Medication Sig Dispense Refill  . acetaminophen (TYLENOL) 500 MG tablet Take 500 mg by mouth 3 (three) times daily as needed for pain.    . Calcium Carbonate-Vitamin D (CALCIUM 600 + D PO) Take 2 tablets by mouth daily.      . clopidogrel (PLAVIX) 75 MG tablet TAKE 1 TABLET EVERY DAY 90 tablet 3  . Ferrous Sulfate (IRON) 325 (65 FE) MG TABS Take 1 tablet by mouth daily.      [name] glucosamine-chondroitin 500-400 MG tablet Take 1 tablet by mouth 2 (two) times daily.      [name] losartan (COZAAR) 25 MG tablet TAKE 1 TABLET EVERY DAY 90  tablet 3  . modafinil (PROVIGIL) 200 MG tablet Take 1 tablet (200 mg total) by mouth daily. 30 tablet 1  . Multiple Vitamin (MULTIVITAMIN) tablet Take 1 tablet by mouth daily.      . Nutritional Supplements (PEPTAMEN 1.5 PO) Take by mouth 2 (two) times daily.    [name] omeprazole (PRILOSEC) 20 MG capsule Take 20 mg by mouth daily as needed.     . potassium chloride SA (K-DUR,KLOR-CON) 20 MEQ tablet Take 1 tablet (20 mEq total) by mouth daily. 90 tablet 3  . SF 5000 PLUS 1.1 % CREA dental cream   0  . SUMAtriptan (IMITREX) 20 MG/ACT nasal spray Place 1 spray (20 mg total) into the nose every 2 (two) hours as needed for migraine. 12 Inhaler 1  . topiramate (TOPAMAX) 100 MG tablet Take 1 tablet (100 mg total) by mouth daily. 90 tablet 3  . vitamin E 200 UNIT  capsule Take 200 Units by mouth daily.     No current facility-administered medications on file prior to visit.    Allergies  Allergen Reactions  . Aspirin   . Coconut Oil   . Latex   . Lisinopril     REACTION: cough  . Metoprolol Itching  . Peanut-Containing Drug Products   . Penicillins     REACTION: rash  . Prednisone   . Strawberry Extract     Family History  Problem Relation Age of Onset  . Cancer Mother     Breast Cancer, Colon Cancer  . Asthma Mother   . Depression Mother   . Bipolar disorder Mother   . Dementia Mother   . Arthritis Mother   . Hypertension Father   . Migraines Father   . Cancer Maternal Aunt   . Heart disease Maternal Grandmother   . Cancer Maternal Grandfather     BP 134/83 mmHg  Pulse 86  Temp(Src) 97.5 F (36.4 C) (Oral)  Ht 5' 4.5" (1.638 m)  Wt 276 lb (125.193 kg)  BMI 46.66 kg/m2  SpO2 97%  Review of Systems Denies fever and sob.  She has diffuse arthralgias, worst at the lower back and knees.      Objective:   Physical Exam VITAL SIGNS:  See vs page GENERAL: no distress head: no deformity eyes: no periorbital swelling, no proptosis external nose and ears are normal mouth: no lesion seen. Both eac's and tm's are normal LUNGS:  Clear to auscultation Pulses: dorsalis pedis intact bilat.   MSK: no deformity of the feet CV: 1+ bilat leg edema Skin:  no ulcer on the feet, but there are bilat calluses.  normal color and temp on the feet. Neuro: sensation is intact to touch on the feet.  Ext: There is bilateral onychomycosis of the toenails.    CXR: NAD Lab Results  Component Value Date   HGBA1C 6.0 02/15/2015   Lab Results  Component Value Date   CHOL 144 02/15/2015   HDL 56.20 02/15/2015   LDLCALC 71 02/15/2015   TRIG 82.0 02/15/2015   CHOLHDL 3 02/15/2015       Assessment & Plan:  URI: new. DM: well-controlled.  Please continue the same metformin. Dyslipidemia: well-controlled.  Please continue the same  crestor.     Subjective:   Patient here for Medicare annual wellness visit and management of other chronic and acute problems.     Risk factors: advanced age    33 of Physicians Providing Medical Care to Patient:  See "snapshot"   Activities of  Daily Living: In your present state of health, do you have any difficulty performing the following activities?:  Preparing food and eating?: No  Bathing yourself: No  Getting dressed: No  Using the toilet:No  Moving around from place to place: No  In the past year have you fallen or had a near fall?:No    Home Safety: Has smoke detector and wears seat belts. No firearms.  Diet and Exercise  Current exercise habits: pt says not good Dietary issues discussed: pt reports a somewhat healthy diet   Depression Screen  Q1: Over the past two weeks, have you felt down, depressed or hopeless?no  Q2: Over the past two weeks, have you felt little interest or pleasure in doing things? no   The following portions of the patient's history were reviewed and updated as appropriate: allergies, current medications, past family history, past medical history, past social history, past surgical history and problem list.   Review of Systems  Denies hearing loss, and visual loss Objective:   Vision:  Sees opthalmologist Hearing: grossly normal Body mass index:  See vs page Msk: pt slowly performs "get-up-and-go" from a sitting position Cognitive Impairment Assessment: cognition, memory and judgment appear normal.  remembers 3/3 at 5 minutes.  excellent recall.  can easily read and write a sentence.  alert and oriented x 3.     Assessment:   Medicare wellness utd on preventive parameters    Plan:   During the course of the visit the patient was educated and counseled about appropriate screening and preventive services including:       Fall prevention   Screening mammography  Bone densitometry screening  Diabetes screening  Nutrition counseling    Vaccines / LABS Zostavax / Pneumococcal Vaccine  today   Patient Instructions (the written plan) was given to the patient.

## 2015-02-15 NOTE — Telephone Encounter (Signed)
Rx submitted per pt's request.

## 2015-02-21 ENCOUNTER — Other Ambulatory Visit: Payer: Self-pay

## 2015-02-21 ENCOUNTER — Encounter: Payer: Self-pay | Admitting: Endocrinology

## 2015-02-21 MED ORDER — FEXOFENADINE-PSEUDOEPHED ER 180-240 MG PO TB24: 1.0000 | ORAL_TABLET | Freq: Every day | ORAL | Status: DC

## 2015-02-25 ENCOUNTER — Telehealth: Payer: Self-pay

## 2015-02-25 NOTE — Telephone Encounter (Signed)
[employer] has approved the request for coverage on Modafinil effective from 02/15/2015 to 02/14/2017, or until the policy changes or is terminated Ref # [id number] Ref ID # [id number]

## 2015-02-27 ENCOUNTER — Encounter: Payer: Self-pay | Admitting: *Deleted

## 2015-03-06 ENCOUNTER — Ambulatory Visit (INDEPENDENT_AMBULATORY_CARE_PROVIDER_SITE_OTHER)
Admission: RE | Admit: 2015-03-06 | Discharge: 2015-03-06 | Disposition: A | Discharge status: Home / Self Care | Payer: Medicare Other | Source: Ambulatory Visit | Attending: Endocrinology | Admitting: Endocrinology

## 2015-03-06 DIAGNOSIS — N951 Menopausal and female climacteric states: Secondary | ICD-10-CM

## 2015-03-06 DIAGNOSIS — Z780 Asymptomatic menopausal state: Secondary | ICD-10-CM

## 2015-03-11 NOTE — Progress Notes (Signed)
   Subjective:    Patient ID: Heather [name], female    DOB: 03-31-1945, 69 y.o.   MRN: [id number]  HPI    Review of Systems     Objective:   Physical Exam        Assessment & Plan:  Dx code N95.1 was an error and should have been Z78.0

## 2015-03-13 ENCOUNTER — Ambulatory Visit (INDEPENDENT_AMBULATORY_CARE_PROVIDER_SITE_OTHER)
Admission: RE | Admit: 2015-03-13 | Discharge: 2015-03-13 | Disposition: A | Discharge status: Home / Self Care | Payer: Medicare Other | Source: Ambulatory Visit | Attending: Family Medicine | Admitting: Family Medicine

## 2015-03-13 ENCOUNTER — Ambulatory Visit (INDEPENDENT_AMBULATORY_CARE_PROVIDER_SITE_OTHER): Payer: Medicare Other | Admitting: Family Medicine

## 2015-03-13 ENCOUNTER — Encounter: Payer: Self-pay | Admitting: Family Medicine

## 2015-03-13 VITALS — BP 130/82 | HR 80 | Ht 64.5 in | Wt 279.0 lb

## 2015-03-13 DIAGNOSIS — M25562 Pain in left knee: Secondary | ICD-10-CM

## 2015-03-13 DIAGNOSIS — M170 Bilateral primary osteoarthritis of knee: Secondary | ICD-10-CM | POA: Insufficient documentation

## 2015-03-13 DIAGNOSIS — M25561 Pain in right knee: Secondary | ICD-10-CM

## 2015-03-13 DIAGNOSIS — M5136 Other intervertebral disc degeneration, lumbar region: Secondary | ICD-10-CM | POA: Diagnosis not present

## 2015-03-13 DIAGNOSIS — G8929 Other chronic pain: Secondary | ICD-10-CM

## 2015-03-13 DIAGNOSIS — M545 Low back pain: Secondary | ICD-10-CM

## 2015-03-13 NOTE — Assessment & Plan Note (Signed)
Encourage weight loss and the importance of staying active and how this will help most of her joints including her knees.

## 2015-03-13 NOTE — Assessment & Plan Note (Signed)
Patient's x-rays 4 years ago did show moderate arthritis at that time and I do think the patient likely has severe arthritis. X-rays ordered and are pending today. Topical anti-inflammatories given and we discussed home exercises. Patient work with Product/process development scientist. We also discussed icing regimen and other home modalities that can be beneficial. Patient may need another round of formal physical therapy and I would consider aquatic therapy secondary to patient's morbid obesity. Encourage weight loss and we discussed proper shoe choices. Patient will come back and see me again in 4 weeks for further evaluation.

## 2015-03-13 NOTE — Progress Notes (Signed)
[name] [place], [place] [address] Phone: [phone] Subjective:    I'm seeing this patient by the request  of:  [name], MD   CC: back and bilateral knee pain  EXN:[id number] Heather [name] is a 70 y.o. female coming in with complaint of muscle aches and pains. Patient is completely within her back is seem to be more stiff. Thinks is more secondary to her knees. Having bilateral knee pain. Patient did have x-rays back in 2014 were individually visualized and reviewed by me today. Patient's x-rays at that time did have moderate osteoarthritic changes of the right knee. Patient has been in rehabilitation for her knee now for the last several weeks and was making improvement. Now that she is no longer going she is having increasing pain again. Scrubs the pain as more of a dull, throbbing aching sensation but no significant instability. States that it seems to be more chronic. Worse with going up or downstairs. Mild radiation down the leg but no weakness. Rates the severity of pain a 7 out of 10.  Patient is also complaining of bilateral lower back pain.has had this pain for some time. Seems to be less active recently secondary to her knee pain. Describes it as a dull throbbing aching sensation. No radiation into the legs. No numbness. Has been not focusing on herself as much. States that it is more of a tightness that seems to get better with activity. Dull and throbbing in character. Severity 4 out of 10.    Past Medical History  Diagnosis Date  . ALLERGIC RHINITIS 10/22/2006  . ANEMIA 12/18/2008  . ASYMPTOMATIC POSTMENOPAUSAL STATUS 11/22/2007  . DIABETES MELLITUS, TYPE II 01/13/2007  . GERD 07/20/2007  . GOITER, MULTINODULAR 07/20/2007  . Headache(784.0) 07/20/2007  . HEARING LOSS 11/22/2007  . HYPERCHOLESTEROLEMIA 01/13/2007  . HYPERTENSION 10/22/2006  . OSTEOARTHRITIS 10/22/2006  . Other chronic nonalcoholic liver disease 9/67/5916  .  Hyperglycemia   . NASH (nonalcoholic steatohepatitis)   . Fibroid   . Hx of colposcopy with cervical biopsy   . Abnormal Pap smear   . Complication of anesthesia   . Dyslipidemia   . Obesity   . Obstructive sleep apnea   . Degenerative arthritis   . Nocturnal hypoxemia 02/17/2013   Past Surgical History  Procedure Laterality Date  . Esophagogastroduodenoscopy  12/08/2005  . Stress cardiolite  10/21/2005  . Electrocardiogram  10/15/2006  . Dexa  08/2005  . Wisdom tooth extraction    . Breast surgery      Breast reduction  . Sweat gland removal    . Dilation and curettage of uterus    . Eye surgery     Social History  Substance Use Topics  . Smoking status: Former Smoker -- 2.00 packs/day for 20 years    Types: Cigarettes    Quit date: 03/03/1979  . Smokeless tobacco: Never Used  . Alcohol Use: No   Allergies  Allergen Reactions  . Aspirin   . Coconut Oil   . Latex   . Lisinopril     REACTION: cough  . Metoprolol Itching  . Peanut-Containing Drug Products   . Penicillins     REACTION: rash  . Prednisone   . Strawberry Extract    Family History  Problem Relation Age of Onset  . Cancer Mother     Breast Cancer, Colon Cancer  . Asthma Mother   . Depression Mother   . Bipolar disorder Mother   .  Dementia Mother   . Arthritis Mother   . Hypertension Father   . Migraines Father   . Cancer Maternal Aunt   . Heart disease Maternal Grandmother   . Cancer Maternal Grandfather     Past medical history, social, surgical and family history all reviewed in electronic medical record.   Review of Systems: No headache, visual changes, nausea, vomiting, diarrhea, constipation, dizziness, abdominal pain, skin rash, fevers, chills, night sweats, weight loss, swollen lymph nodes, body aches, joint swelling, muscle aches, chest pain, shortness of breath, mood changes.   Objective Blood pressure 130/82, pulse 80, height 5' 4.5" (1.638 m), weight 279 lb (126.554 kg), SpO2 96  %.  General: No apparent distress alert and oriented x3 mood and affect normal, dressed appropriately. Morbidly obese HEENT: Pupils equal, extraocular movements intact  Respiratory: Patient's speak in full sentences and does not appear short of breath  Cardiovascular: No lower extremity edema, non tender, no erythema  Skin: Warm dry intact with no signs of infection or rash on extremities or on axial skeleton.  Abdomen: Soft nontender  Neuro: Cranial nerves II through XII are intact, neurovascularly intact in all extremities with 2+ DTRs and 2+ pulses.  Lymph: No lymphadenopathy of posterior or anterior cervical chain or axillae bilaterally.  Gait normal with good balance and coordination.  MSK:  Non tender with full range of motion and good stability and symmetric strength and tone of shoulders, elbows, wrist, hip, and ankles bilaterally.  Knee:bilateral Difficult to assess secondary to patient's body habitus Tender to palpation in the medial aspect of the knees as well as the patellofemoral joint. ROM full in flexion and extension and lower leg rotation. Ligaments with solid consistent endpoints including ACL, PCL, LCL, MCL. Negative Mcmurray's, Apley's, and Thessalonian tests. mild painful patellar compression. Patellar glide moderatecrepitus. Patellar and quadriceps tendons unremarkable. Hamstring and quadriceps strength is normal.   Back Exam:  Inspection: Unremarkable  Motion: Flexion 45 deg, Extension 45 deg, Side Bending to 45 deg bilaterally,  Rotation to 45 deg bilaterally  SLR laying: Negative  XSLR laying: Negative   Palpable tenderness: tender to palpation in the paraspinal musculature of the lumbar spine bilaterally. FABER: negative. Sensory change: Gross sensation intact to all lumbar and sacral dermatomes.  Reflexes: 2+ at both patellar tendons, 2+ at achilles tendons, Babinski's downgoing.  Strength at foot  Plantar-flexion: 5/5 Dorsi-flexion: 5/5 Eversion: 5/5  Inversion: 5/5  Leg strength  Quad: 5/5 Hamstring: 5/5 Hip flexor: 5/5 Hip abductors: 5/5  Gait unremarkable. .    Impression and Recommendations:     This case required medical decision making of moderate complexity.

## 2015-03-13 NOTE — Assessment & Plan Note (Signed)
Patient does have some dull, throbbing aching sensation back and likely does have some underlying arthritis. I do think that there is poor core strength that is also contribute in. We discussed icing regimen, home exercises, what activities to do an which was potentially avoid. We discussed home exercises and patient work with Product/process development scientist. We discussed things to potentially avoid. Patient will try shoes to help with alignment that can help some of his discomfort as well. Encourage weight loss. Return in 4 weeks for further evaluation. X-rays are pending.

## 2015-03-13 NOTE — Progress Notes (Signed)
Pre visit review using our clinic review tool, if applicable. No additional management support is needed unless otherwise documented below in the visit note. 

## 2015-03-13 NOTE — Patient Instructions (Addendum)
Good to see you.  Xrays downstairs Ice 20 minutes 2 times daily. Usually after activity and before bed. Exercises 3 times a week. Lets focus on the back.  We can always get PT involved if no improvement Take tylenol 650 mg three times a day is the best evidence based medicine we have for arthritis.  Glucosamine sulfate 1554m a day is a supplement that has been shown to help moderate to severe arthritis. Vitamin D 2000 IU daily Fish oil 2 grams daily.  Tumeric 5053mtwice daily.  Take vitamin C 50065mwith your iron Try pennsaid pinkie amount topically 2 times daily as needed.  Cortisone injections are an option if these interventions do not seem to make a difference or need more relief.  If cortisone injections do not help, there are different types of shots that may help but they take longer to take effect.  We can discuss this at follow up.  It's important that you continue to stay active. Controlling your weight is important.  Consider physical therapy to strengthen muscles around the joint that hurts to take pressure off of the joint itself.we will consider aquatic therapy.  Shoe inserts with good arch support may be helpful.  Spenco orthotics "total support" at [employer] could help.  Water aerobics and cycling with low resistance are the best two types of exercise for arthritis. Come back and see me in 4 weeks.

## 2015-03-19 ENCOUNTER — Other Ambulatory Visit: Payer: Self-pay | Admitting: Endocrinology

## 2015-04-09 ENCOUNTER — Encounter: Payer: Self-pay | Admitting: Family Medicine

## 2015-04-09 ENCOUNTER — Ambulatory Visit (INDEPENDENT_AMBULATORY_CARE_PROVIDER_SITE_OTHER): Payer: Medicare Other | Admitting: Family Medicine

## 2015-04-09 VITALS — BP 124/82 | HR 85 | Ht 64.5 in | Wt 282.0 lb

## 2015-04-09 DIAGNOSIS — M5136 Other intervertebral disc degeneration, lumbar region: Secondary | ICD-10-CM

## 2015-04-09 DIAGNOSIS — M51369 Other intervertebral disc degeneration, lumbar region without mention of lumbar back pain or lower extremity pain: Secondary | ICD-10-CM

## 2015-04-09 DIAGNOSIS — M170 Bilateral primary osteoarthritis of knee: Secondary | ICD-10-CM

## 2015-04-09 NOTE — Progress Notes (Signed)
[name] [place], [place] [address] Phone: [phone] Subjective:    I'm seeing this patient by the request  of:  [name], MD   CC: back and bilateral knee pain  [id number] Heather [name] is a 70 y.o. female coming in with complaint of muscle aches and pains. Patient is completely within her back is seem to be more stiff.  Patient at last exam did have x-rays. These were reviewed and independently visualized by me today. X-rays the patient's back showing moderate degenerative disc disease at multiple levels as well as moderate to severe facet arthritic changes. Proximal only 50% better with patient doing the over-the-counter medicines. Not doing the home exercises. Does not want to go to formal physical therapy. Overall thinks she is making progress.  Patient also had bilateral knee pain. Was found to have severe nearly bone-on-bone osteophytic changes of the knees bilaterally. Patient was to do home exercises, over-the-counter medications, remains somewhat active. Patient has not been doing the exercises regularly but does state that she has made improvement. Balance 625% better. Has noticed that she has had a little more range of motion. No new symptoms. Still minimal instability noted.   Past Medical History  Diagnosis Date  . ALLERGIC RHINITIS 10/22/2006  . ANEMIA 12/18/2008  . ASYMPTOMATIC POSTMENOPAUSAL STATUS 11/22/2007  . DIABETES MELLITUS, TYPE II 01/13/2007  . GERD 07/20/2007  . GOITER, MULTINODULAR 07/20/2007  . Headache(784.0) 07/20/2007  . HEARING LOSS 11/22/2007  . HYPERCHOLESTEROLEMIA 01/13/2007  . HYPERTENSION 10/22/2006  . OSTEOARTHRITIS 10/22/2006  . Other chronic nonalcoholic liver disease 7/41/6384  . Hyperglycemia   . NASH (nonalcoholic steatohepatitis)   . Fibroid   . Hx of colposcopy with cervical biopsy   . Abnormal Pap smear   . Complication of anesthesia   . Dyslipidemia   . Obesity   . Obstructive  sleep apnea   . Degenerative arthritis   . Nocturnal hypoxemia 02/17/2013   Past Surgical History  Procedure Laterality Date  . Esophagogastroduodenoscopy  12/08/2005  . Stress cardiolite  10/21/2005  . Electrocardiogram  10/15/2006  . Dexa  08/2005  . Wisdom tooth extraction    . Breast surgery      Breast reduction  . Sweat gland removal    . Dilation and curettage of uterus    . Eye surgery     Social History  Substance Use Topics  . Smoking status: Former Smoker -- 2.00 packs/day for 20 years    Types: Cigarettes    Quit date: 03/03/1979  . Smokeless tobacco: Never Used  . Alcohol Use: No   Allergies  Allergen Reactions  . Aspirin   . Coconut Oil   . Latex   . Lisinopril     REACTION: cough  . Metoprolol Itching  . Peanut-Containing Drug Products   . Penicillins     REACTION: rash  . Prednisone   . Strawberry Extract    Family History  Problem Relation Age of Onset  . Cancer Mother     Breast Cancer, Colon Cancer  . Asthma Mother   . Depression Mother   . Bipolar disorder Mother   . Dementia Mother   . Arthritis Mother   . Hypertension Father   . Migraines Father   . Cancer Maternal Aunt   . Heart disease Maternal Grandmother   . Cancer Maternal Grandfather     Past medical history, social, surgical and family history all reviewed in electronic medical record.  Review of Systems: No headache, visual changes, nausea, vomiting, diarrhea, constipation, dizziness, abdominal pain, skin rash, fevers, chills, night sweats, weight loss, swollen lymph nodes, body aches, joint swelling, muscle aches, chest pain, shortness of breath, mood changes.   Objective Blood pressure 124/82, pulse 85, height 5' 4.5" (1.638 m), weight 282 lb (127.914 kg), SpO2 95 %.  General: No apparent distress alert and oriented x3 mood and affect normal, dressed appropriately. Morbidly obese HEENT: Pupils equal, extraocular movements intact  Respiratory: Patient's speak in full  sentences and does not appear short of breath  Cardiovascular: No lower extremity edema, non tender, no erythema  Skin: Warm dry intact with no signs of infection or rash on extremities or on axial skeleton.  Abdomen: Soft nontender  Neuro: Cranial nerves II through XII are intact, neurovascularly intact in all extremities with 2+ DTRs and 2+ pulses.  Lymph: No lymphadenopathy of posterior or anterior cervical chain or axillae bilaterally.  Gait normal with good balance and coordination.  MSK:  Non tender with full range of motion and good stability and symmetric strength and tone of shoulders, elbows, wrist, hip, and ankles bilaterally.  Knee:bilateral Difficult to assess secondary to patient's body habitus Continued tenderness to the medial aspect of the knees bilaterally ROM full in flexion and extension and lower leg rotation. Ligaments with solid consistent endpoints including ACL, PCL, LCL, MCL. Negative Mcmurray's, Apley's, and Thessalonian tests. mild painful patellar compression. Patellar glide moderatecrepitus. Patellar and quadriceps tendons unremarkable. Hamstring and quadriceps strength is normal.   Back Exam:  Inspection: Unremarkable  Motion: Flexion 45 deg, Extension 45 deg, Side Bending to 45 deg bilaterally,  Rotation to 45 deg bilaterally  SLR laying: Negative  XSLR laying: Negative   Mild tenderness still remaining but only in the paraspinal musculature. FABER: negative. Sensory change: Gross sensation intact to all lumbar and sacral dermatomes.  Reflexes: 2+ at both patellar tendons, 2+ at achilles tendons, Babinski's downgoing.  Strength at foot  Plantar-flexion: 5/5 Dorsi-flexion: 5/5 Eversion: 5/5 Inversion: 5/5  Leg strength  Quad: 5/5 Hamstring: 5/5 Hip flexor: 5/5 Hip abductors: 5/5  Gait unremarkable. .    Impression and Recommendations:     This case required medical decision making of moderate complexity.

## 2015-04-09 NOTE — Progress Notes (Signed)
Pre visit review using our clinic review tool, if applicable. No additional management support is needed unless otherwise documented below in the visit note. 

## 2015-04-09 NOTE — Assessment & Plan Note (Signed)
Encourage weight loss.

## 2015-04-09 NOTE — Patient Instructions (Signed)
Good to see you  You are doing great  Ice when you need it Try to do the exercises 2-3 times a week Continue the vitamins Tylenol 573m 3 times a day or 6518mup to 3 times a day as well.  I am impressed See me again in 6 weeks.

## 2015-04-09 NOTE — Assessment & Plan Note (Signed)
Moderate to severe in nature. Likely going to have some exacerbations. We discussed possible injections if any worsening symptoms. Declined formal physical therapy. Patient has topical anti-inflammatories. Discuss we should avoid any more awake and encourage weight loss. Discuss proper shoes again. Patient had multiple questions were answered today. Patient will come back and see me again in 4-6 weeks for further evaluation and treatment.  Spent  25 minutes with patient face-to-face and had greater than 50% of counseling including as described above in assessment and plan.

## 2015-04-15 ENCOUNTER — Other Ambulatory Visit: Payer: Self-pay | Admitting: Endocrinology

## 2015-04-23 DIAGNOSIS — Z6841 Body Mass Index (BMI) 40.0 and over, adult: Secondary | ICD-10-CM | POA: Diagnosis not present

## 2015-04-23 DIAGNOSIS — Z1231 Encounter for screening mammogram for malignant neoplasm of breast: Secondary | ICD-10-CM | POA: Diagnosis not present

## 2015-04-23 DIAGNOSIS — Z01419 Encounter for gynecological examination (general) (routine) without abnormal findings: Secondary | ICD-10-CM | POA: Diagnosis not present

## 2015-05-21 ENCOUNTER — Ambulatory Visit (INDEPENDENT_AMBULATORY_CARE_PROVIDER_SITE_OTHER): Payer: Medicare Other | Admitting: Family Medicine

## 2015-05-21 ENCOUNTER — Encounter: Payer: Self-pay | Admitting: Family Medicine

## 2015-05-21 VITALS — BP 132/80 | HR 76

## 2015-05-21 DIAGNOSIS — G8929 Other chronic pain: Secondary | ICD-10-CM | POA: Diagnosis not present

## 2015-05-21 DIAGNOSIS — M170 Bilateral primary osteoarthritis of knee: Secondary | ICD-10-CM

## 2015-05-21 DIAGNOSIS — M545 Low back pain: Secondary | ICD-10-CM

## 2015-05-21 MED ORDER — GABAPENTIN 100 MG PO CAPS: 200.0000 mg | ORAL_CAPSULE | Freq: Every day | ORAL | Status: DC

## 2015-05-21 NOTE — Progress Notes (Signed)
[name] [place], [name] [address] Phone: [phone] Subjective:    I'm seeing this patient by the request  of:  [name], MD   CC: back and bilateral knee pain  [id number] EMY Heather [name] is a 70 y.o. female coming in with complaint of muscle aches and pains. Patient is completely within her back is seem to be more stiff.  Patient at last exam did have x-rays. These were reviewed and independently visualized by me today. X-rays the patient's back showing moderate degenerative disc disease at multiple levels as well as moderate to severe facet arthritic changes. [name] states that the pain is better but continues to have difficulty even standing for greater than 5 minutes at a time. Notices when she goes to the store she likes to lean on a cart. When she is at home her husband also states that she like to lean over. She states that it seems to feel better. If she stands straight up sometimes her legs feel a little bit weak.  Patient also had bilateral knee pain. Was found to have severe nearly bone-on-bone osteophytic changes of the knees bilaterally. Continues to make improvements over all but unfortunately the right knee seems to have plateaued or maybe even worsening a little bit. Patient states that going up and down stairs is still severely tender on the right knee. Left knee seems to be doing relatively well. Continues to vitamins. Topical anti-inflammatory has been somewhat helpful.   Past Medical History  Diagnosis Date  . ALLERGIC RHINITIS 10/22/2006  . ANEMIA 12/18/2008  . ASYMPTOMATIC POSTMENOPAUSAL STATUS 11/22/2007  . DIABETES MELLITUS, TYPE II 01/13/2007  . GERD 07/20/2007  . GOITER, MULTINODULAR 07/20/2007  . Headache(784.0) 07/20/2007  . HEARING LOSS 11/22/2007  . HYPERCHOLESTEROLEMIA 01/13/2007  . HYPERTENSION 10/22/2006  . OSTEOARTHRITIS 10/22/2006  . Other chronic nonalcoholic liver disease 0/10/2328  . Hyperglycemia    . NASH (nonalcoholic steatohepatitis)   . Fibroid   . Hx of colposcopy with cervical biopsy   . Abnormal Pap smear   . Complication of anesthesia   . Dyslipidemia   . Obesity   . Obstructive sleep apnea   . Degenerative arthritis   . Nocturnal hypoxemia 02/17/2013   Past Surgical History  Procedure Laterality Date  . Esophagogastroduodenoscopy  12/08/2005  . Stress cardiolite  10/21/2005  . Electrocardiogram  10/15/2006  . Dexa  08/2005  . Wisdom tooth extraction    . Breast surgery      Breast reduction  . Sweat gland removal    . Dilation and curettage of uterus    . Eye surgery     Social History  Substance Use Topics  . Smoking status: Former Smoker -- 2.00 packs/day for 20 years    Types: Cigarettes    Quit date: 03/03/1979  . Smokeless tobacco: Never Used  . Alcohol Use: No   Allergies  Allergen Reactions  . Aspirin   . Coconut Oil   . Latex   . Lisinopril     REACTION: cough  . Metoprolol Itching  . Peanut-Containing Drug Products   . Penicillins     REACTION: rash  . Prednisone   . Strawberry Extract    Family History  Problem Relation Age of Onset  . Cancer Mother     Breast Cancer, Colon Cancer  . Asthma Mother   . Depression Mother   . Bipolar disorder Mother   . Dementia Mother   . Arthritis Mother   .  Hypertension Father   . Migraines Father   . Cancer Maternal Aunt   . Heart disease Maternal Grandmother   . Cancer Maternal Grandfather     Past medical history, social, surgical and family history all reviewed in electronic medical record.   Review of Systems: No headache, visual changes, nausea, vomiting, diarrhea, constipation, dizziness, abdominal pain, skin rash, fevers, chills, night sweats, weight loss, swollen lymph nodes, body aches, joint swelling, muscle aches, chest pain, shortness of breath, mood changes.   Objective Blood pressure 132/80, pulse 76.  General: No apparent distress alert and oriented x3 mood and affect normal,  dressed appropriately. Morbidly obese HEENT: Pupils equal, extraocular movements intact  Respiratory: Patient's speak in full sentences and does not appear short of breath  Cardiovascular: No lower extremity edema, non tender, no erythema  Skin: Warm dry intact with no signs of infection or rash on extremities or on axial skeleton.  Abdomen: Soft nontender  Neuro: Cranial nerves II through XII are intact, neurovascularly intact in all extremities with 2+ DTRs and 2+ pulses.  Lymph: No lymphadenopathy of posterior or anterior cervical chain or axillae bilaterally.  Gait normal with good balance and coordination.  MSK:  Non tender with full range of motion and good stability and symmetric strength and tone of shoulders, elbows, wrist, hip, and ankles bilaterally.  Knee:bilateral Difficult to assess secondary to patient's body habitus Or tenderness on the right side than the left knee. ROM full in flexion and extension and lower leg rotation. Ligaments with solid consistent endpoints including ACL, PCL, LCL, MCL. Negative Mcmurray's, Apley's, and Thessalonian tests. mild painful patellar compression. Patellar glide moderatecrepitus. Patellar and quadriceps tendons unremarkable. Hamstring and quadriceps strength is normal.   Back Exam:  Inspection: Unremarkable  Motion: Flexion 45 deg, Extension 45 deg, Side Bending to 45 deg bilaterally,  Rotation to 45 deg bilaterally  SLR laying: Negative tightest hamstring bilaterally. XSLR laying: Negative   Mild tenderness still remaining but only in the paraspinal musculature. FABER: negative. Sensory change: Gross sensation intact to all lumbar and sacral dermatomes.  Reflexes: 2+ at both patellar tendons, 2+ at achilles tendons, Babinski's downgoing.  Strength at foot  Plantar-flexion: 5/5 Dorsi-flexion: 5/5 Eversion: 5/5 Inversion: 5/5  Leg strength  Quad: 5/5 Hamstring: 4-5 Hip flexor: 4-5 Hip abductors: 4-5  Gait unremarkable. ]mild  increasing weakness compared to previous exam. . After informed written and verbal consent, patient was seated on exam table. Right knee was prepped with alcohol swab and utilizing anterolateral approach, patient's right knee space was injected with 4:1  marcaine 0.5%: Kenalog 24m/dL. Patient tolerated the procedure well without immediate complications.   Impression and Recommendations:     This case required medical decision making of moderate complexity.

## 2015-05-21 NOTE — Assessment & Plan Note (Signed)
Patient signs and symptoms is consistent with spinal stenosis. We are going to start her on gabapentin to see if this is helpful. Patient's x-rays previously did show moderate degenerative disc disease of the lumbar spine. Patient continues to have some weakness as well as fatigue in the legs we may need to consider further workup including advance imaging such as an MRI. This would be only if patient would consider epidurals versus surgical intervention. Hopefully she will respond to the gabapentin. Follow up in 4-6 weeks.

## 2015-05-21 NOTE — Assessment & Plan Note (Signed)
Patient given injection today and tolerated the procedure very well. We discussed icing regimen. I do not think the patient would benefit from a custom brace at this time. Patient does have severe arthritic changes. If no significant improvement she would be a candidate for viscous supplementation. Encourage weight loss and continuing to be active.

## 2015-05-21 NOTE — Patient Instructions (Addendum)
Good to see you  I think some is coming from your back more than anything else Gave you a injection today in your right knee and I hope it helps  Gabapentin 110m at night for 5 nights then 2071mat night thereafter.  It will also help with your sleep  Stay active and use can if you need  Continue the vitamins See me again in 4-6 weeks.

## 2015-06-25 ENCOUNTER — Ambulatory Visit (INDEPENDENT_AMBULATORY_CARE_PROVIDER_SITE_OTHER): Payer: Medicare Other | Admitting: Family Medicine

## 2015-06-25 ENCOUNTER — Encounter: Payer: Self-pay | Admitting: Family Medicine

## 2015-06-25 VITALS — BP 128/82 | HR 85 | Ht 64.5 in | Wt 272.0 lb

## 2015-06-25 DIAGNOSIS — M5136 Other intervertebral disc degeneration, lumbar region: Secondary | ICD-10-CM | POA: Diagnosis not present

## 2015-06-25 DIAGNOSIS — M170 Bilateral primary osteoarthritis of knee: Secondary | ICD-10-CM

## 2015-06-25 NOTE — Patient Instructions (Addendum)
Wonderful to see you  Ice is still a good idea Listen to your body and decide on the medicines but if worsen then go back to scheduled Continue to monitor the back  Can repeat injection in knee if needed every 3 months See me again in 2 months or otherwise see me when you need me.

## 2015-06-25 NOTE — Assessment & Plan Note (Signed)
Severe spinal stenosis with what appears to be significant arthritic changes of multiple levels of the lumbar spine. Patient is having some radicular symptoms that seems to correspond with activity. Seems to resolve with rest. We will continue to monitor. Encourage patient core strengthening. Decline formal physical therapy. We discussed icing regimen. Forcing symptoms we'll consider increasing patient's gabapentin. In addition of this possibly advance imaging could be warranted if we can find an epidural steroid injection that could be beneficial. Follow-up in 2 months.

## 2015-06-25 NOTE — Progress Notes (Signed)
[name] [place], [place] [address] Phone: [phone] Subjective:    I'm seeing this patient by the request  of:  [name], MD   CC: back and bilateral knee pain  [id number] Heather [name] is a 70 y.o. female coming in with complaint of muscle aches and pains. Patient is completely within her back is seem to be more stiff.  Patient at last exam did have x-rays. These were reviewed and independently visualized by me today. X-rays the patient's back showing moderate degenerative disc disease at multiple levels as well as moderate to severe facet arthritic changes.  Patient states she is been walking more since then he has not been hurting as much. States that has some mild discomfort in the back still. This seems to only occur though after approximate 4 hours. Nothing that is stopping her from activity. Still has some mild weakness of the left leg.  Patient also had bilateral knee pain. Was having more pain in the right knee. Was given an injection. States that she is 95% better. Did not know that she was in so much pain. Still some mild discomfort on the left side.nothing that his stopping at this time.   Past Medical History  Diagnosis Date  . ALLERGIC RHINITIS 10/22/2006  . ANEMIA 12/18/2008  . ASYMPTOMATIC POSTMENOPAUSAL STATUS 11/22/2007  . DIABETES MELLITUS, TYPE II 01/13/2007  . GERD 07/20/2007  . GOITER, MULTINODULAR 07/20/2007  . Headache(784.0) 07/20/2007  . HEARING LOSS 11/22/2007  . HYPERCHOLESTEROLEMIA 01/13/2007  . HYPERTENSION 10/22/2006  . OSTEOARTHRITIS 10/22/2006  . Other chronic nonalcoholic liver disease 2/80/0349  . Hyperglycemia   . NASH (nonalcoholic steatohepatitis)   . Fibroid   . Hx of colposcopy with cervical biopsy   . Abnormal Pap smear   . Complication of anesthesia   . Dyslipidemia   . Obesity   . Obstructive sleep apnea   . Degenerative arthritis   . Nocturnal hypoxemia 02/17/2013   Past  Surgical History  Procedure Laterality Date  . Esophagogastroduodenoscopy  12/08/2005  . Stress cardiolite  10/21/2005  . Electrocardiogram  10/15/2006  . Dexa  08/2005  . Wisdom tooth extraction    . Breast surgery      Breast reduction  . Sweat gland removal    . Dilation and curettage of uterus    . Eye surgery     Social History  Substance Use Topics  . Smoking status: Former Smoker -- 2.00 packs/day for 20 years    Types: Cigarettes    Quit date: 03/03/1979  . Smokeless tobacco: Never Used  . Alcohol Use: No   Allergies  Allergen Reactions  . Aspirin   . Coconut Oil   . Latex   . Lisinopril     REACTION: cough  . Metoprolol Itching  . Peanut-Containing Drug Products   . Penicillins     REACTION: rash  . Prednisone   . Strawberry Extract    Family History  Problem Relation Age of Onset  . Cancer Mother     Breast Cancer, Colon Cancer  . Asthma Mother   . Depression Mother   . Bipolar disorder Mother   . Dementia Mother   . Arthritis Mother   . Hypertension Father   . Migraines Father   . Cancer Maternal Aunt   . Heart disease Maternal Grandmother   . Cancer Maternal Grandfather     Past medical history, social, surgical and family history all reviewed in electronic  medical record.   Review of Systems: No headache, visual changes, nausea, vomiting, diarrhea, constipation, dizziness, abdominal pain, skin rash, fevers, chills, night sweats, weight loss, swollen lymph nodes, body aches, joint swelling, muscle aches, chest pain, shortness of breath, mood changes.   Objective Blood pressure 128/82, pulse 85, height 5' 4.5" (1.638 m), weight 272 lb (123.378 kg), SpO2 93 %.  General: No apparent distress alert and oriented x3 mood and affect normal, dressed appropriately. Morbidly obese HEENT: Pupils equal, extraocular movements intact  Respiratory: Patient's speak in full sentences and does not appear short of breath  Cardiovascular: No lower extremity edema,  non tender, no erythema  Skin: Warm dry intact with no signs of infection or rash on extremities or on axial skeleton.  Abdomen: Soft nontender  Neuro: Cranial nerves II through XII are intact, neurovascularly intact in all extremities with 2+ DTRs and 2+ pulses.  Lymph: No lymphadenopathy of posterior or anterior cervical chain or axillae bilaterally.  Gait normal with good balance and coordination.  MSK:  Non tender with full range of motion and good stability and symmetric strength and tone of shoulders, elbows, wrist, hip, and ankles bilaterally.  Knee:bilateral Difficult to assess secondary to patient's body habitus Minimal tenderness over the right medial sign. ROM full in flexion and extension and lower leg rotation. Ligaments with solid consistent endpoints including ACL, PCL, LCL, MCL. Negative Mcmurray's, Apley's, and Thessalonian tests. mild painful patellar compression. Patellar glide moderatecrepitus. Patellar and quadriceps tendons unremarkable. Hamstring and quadriceps strength is normal.   Back Exam:  Inspection: Unremarkable difficult to assess secondary to body habitus Motion: Flexion 45 deg, Extension 15 deg, Side Bending to 35 deg bilaterally,  Rotation to 45 deg bilaterally  SLR laying: Negative tightest hamstring bilaterally. XSLR laying: Negative   Mild tenderness paraspinal muscular tenderness seems to be only on the left side. FABER: unable to dizziness there to body habitus Sensory change: Gross sensation intact to all lumbar and sacral dermatomes.  Reflexes: 2+ at both patellar tendons, 2+ at achilles tendons, Babinski's downgoing.  Strength at foot  Plantar-flexion: 5/5 Dorsi-flexion: 5/5 Eversion: 5/5 Inversion: 5/5  Leg strength  Quad: 5/5 Hamstring: 4-5 Hip flexor: 4-5 Hip abductors: 4-5  Gait unremarkable. Still mild weakness but no worse than previous exam. .    Impression and Recommendations:     This case required medical decision making of  moderate complexity.

## 2015-06-25 NOTE — Progress Notes (Signed)
Pre visit review using our clinic review tool, if applicable. No additional management support is needed unless otherwise documented below in the visit note. 

## 2015-06-25 NOTE — Assessment & Plan Note (Signed)
Patient is doing relatively well. We discussed icing regimen and home exercises. Discussed the importance of weight loss. Continuing to stay active. Declined any bracing. Can repeat injections every 3 months if necessary. Patient will follow-up with me in 2 months. At that time if worsening symptoms we'll consider injection. Can always doing the left side as well.  Spent  25 minutes with patient face-to-face and had greater than 50% of counseling including as described above in assessment and plan.

## 2015-08-07 DIAGNOSIS — E119 Type 2 diabetes mellitus without complications: Secondary | ICD-10-CM | POA: Diagnosis not present

## 2015-08-07 DIAGNOSIS — Z7984 Long term (current) use of oral hypoglycemic drugs: Secondary | ICD-10-CM | POA: Diagnosis not present

## 2015-08-07 LAB — HM DIABETES EYE EXAM

## 2015-08-14 ENCOUNTER — Ambulatory Visit (INDEPENDENT_AMBULATORY_CARE_PROVIDER_SITE_OTHER): Payer: Medicare Other | Admitting: Endocrinology

## 2015-08-14 ENCOUNTER — Encounter: Payer: Self-pay | Admitting: Endocrinology

## 2015-08-14 VITALS — BP 120/82 | HR 87 | Wt 269.6 lb

## 2015-08-14 DIAGNOSIS — R600 Localized edema: Secondary | ICD-10-CM | POA: Diagnosis not present

## 2015-08-14 LAB — BRAIN NATRIURETIC PEPTIDE: Pro B Natriuretic peptide (BNP): 13 pg/mL (ref 0.0–100.0)

## 2015-08-14 LAB — BASIC METABOLIC PANEL
BUN: 12 mg/dL (ref 6–23)
CALCIUM: 9.2 mg/dL (ref 8.4–10.5)
CO2: 30 meq/L (ref 19–32)
CREATININE: 0.72 mg/dL (ref 0.40–1.20)
Chloride: 102 mEq/L (ref 96–112)
GFR: 103.35 mL/min (ref >60.00)
GLUCOSE: 103 mg/dL — AB (ref 70–99)
Potassium: 3.6 mEq/L (ref 3.5–5.1)
Sodium: 139 mEq/L (ref 135–145)

## 2015-08-14 LAB — POCT GLYCOSYLATED HEMOGLOBIN (HGB A1C): Hemoglobin A1C: 5.9

## 2015-08-14 NOTE — Progress Notes (Signed)
Subjective:    Patient ID: Heather [name], female    DOB: 18-Aug-1945, 70 y.o.   MRN: [id number]  HPI The state of at least three ongoing medical problems is addressed today, with interval history of each noted here: Pt returns for f/u of diabetes mellitus: DM type: 2 Dx'ed: 1157 Complications: none Therapy: metformin.  GDM: never DKA: never Severe hypoglycemia: never Pancreatitis: never Other: she does not take actos, due to edema; pt says she was told by surgeon she was not a good candidate for weight loss surgery, due to abd hernia.  Interval history: pt states she feels well in general. She seldom misses the lasix.  Past Medical History  Diagnosis Date  . ALLERGIC RHINITIS 10/22/2006  . ANEMIA 12/18/2008  . ASYMPTOMATIC POSTMENOPAUSAL STATUS 11/22/2007  . DIABETES MELLITUS, TYPE II 01/13/2007  . GERD 07/20/2007  . GOITER, MULTINODULAR 07/20/2007  . Headache(784.0) 07/20/2007  . HEARING LOSS 11/22/2007  . HYPERCHOLESTEROLEMIA 01/13/2007  . HYPERTENSION 10/22/2006  . OSTEOARTHRITIS 10/22/2006  . Other chronic nonalcoholic liver disease 2/62/0355  . Hyperglycemia   . NASH (nonalcoholic steatohepatitis)   . Fibroid   . Hx of colposcopy with cervical biopsy   . Abnormal Pap smear   . Complication of anesthesia   . Dyslipidemia   . Obesity   . Obstructive sleep apnea   . Degenerative arthritis   . Nocturnal hypoxemia 02/17/2013    Past Surgical History  Procedure Laterality Date  . Esophagogastroduodenoscopy  12/08/2005  . Stress cardiolite  10/21/2005  . Electrocardiogram  10/15/2006  . Dexa  08/2005  . Wisdom tooth extraction    . Breast surgery      Breast reduction  . Sweat gland removal    . Dilation and curettage of uterus    . Eye surgery      Social History   Social History  . Marital Status: Married    Spouse Name: [name]  . Number of Children: 2  . Years of Education: College   Occupational History  . Retired    Social History Main Topics  .  Smoking status: Former Smoker -- 2.00 packs/day for 20 years    Types: Cigarettes    Quit date: 03/03/1979  . Smokeless tobacco: Never Used  . Alcohol Use: No  . Drug Use: No  . Sexual Activity: Yes    Birth Control/ Protection: Surgical   Other Topics Concern  . Not on file   Social History Narrative   Patient is married [name]).   Patient has two children.   Patient is retired.   Patient has a college education.   Patient is right-handed.   Patient lives at home with family.   Caffeine Use: 2 soda every other day    Current Outpatient Prescriptions on File Prior to Visit  Medication Sig Dispense Refill  . acetaminophen (TYLENOL) 500 MG tablet Take 650 mg by mouth 3 (three) times daily as needed for pain.     . Calcium Carbonate-Vitamin D (CALCIUM 600 + D PO) Take 2 tablets by mouth daily.      . clopidogrel (PLAVIX) 75 MG tablet TAKE 1 TABLET EVERY DAY 90 tablet 3  . Ferrous Sulfate (IRON) 325 (65 FE) MG TABS Take 1 tablet by mouth daily.      . fexofenadine-pseudoephedrine (ALLEGRA-D ALLERGY & CONGESTION) 180-240 MG 24 hr tablet Take 1 tablet by mouth daily. 30 tablet 11  . Fluticasone-Salmeterol (ADVAIR) 100-50 MCG/DOSE AEPB Inhale 1 puff into the lungs 2 (two)  times daily. 1 each 3  . furosemide (LASIX) 40 MG tablet Take 1 tablet (40 mg total) by mouth daily. 90 tablet 3  . gabapentin (NEURONTIN) 100 MG capsule Take 2 capsules (200 mg total) by mouth at bedtime. 60 capsule 3  . glucosamine-chondroitin 500-400 MG tablet Take 1 tablet by mouth 2 (two) times daily.      [name] losartan (COZAAR) 25 MG tablet TAKE 1 TABLET EVERY DAY 90 tablet 3  . metFORMIN (GLUCOPHAGE-XR) 500 MG 24 hr tablet TAKE 2 TABLETS TWICE DAILY 360 tablet 3  . modafinil (PROVIGIL) 200 MG tablet Take 1 tablet (200 mg total) by mouth daily. 30 tablet 1  . Multiple Vitamin (MULTIVITAMIN) tablet Take 1 tablet by mouth daily.      . Nutritional Supplements (PEPTAMEN 1.5 PO) Take by mouth 2 (two) times daily.      [name] omeprazole (PRILOSEC) 20 MG capsule Take 20 mg by mouth daily as needed.     . potassium chloride SA (K-DUR,KLOR-CON) 20 MEQ tablet TAKE 1 TABLET EVERY DAY 90 tablet 3  . rosuvastatin (CRESTOR) 40 MG tablet TAKE 1 TABLET EVERY DAY 30 tablet 3  . SF 5000 PLUS 1.1 % CREA dental cream   0  . SUMAtriptan (IMITREX) 20 MG/ACT nasal spray Place 1 spray (20 mg total) into the nose every 2 (two) hours as needed for migraine. 12 Inhaler 1  . topiramate (TOPAMAX) 100 MG tablet Take 1 tablet (100 mg total) by mouth daily. 90 tablet 3  . vitamin E 200 UNIT capsule Take 200 Units by mouth daily.     No current facility-administered medications on file prior to visit.    Allergies  Allergen Reactions  . Aspirin   . Coconut Oil   . Latex   . Lisinopril     REACTION: cough  . Metoprolol Itching  . Peanut-Containing Drug Products   . Penicillins     REACTION: rash  . Prednisone   . Strawberry Extract     Family History  Problem Relation Age of Onset  . Cancer Mother     Breast Cancer, Colon Cancer  . Asthma Mother   . Depression Mother   . Bipolar disorder Mother   . Dementia Mother   . Arthritis Mother   . Hypertension Father   . Migraines Father   . Cancer Maternal Aunt   . Heart disease Maternal Grandmother   . Cancer Maternal Grandfather     BP 120/82 mmHg  Pulse 87  Wt 269 lb 9.6 oz (122.29 kg)  SpO2 96%  Review of Systems She has slight doe.     Objective:   Physical Exam VITAL SIGNS:  See vs page GENERAL: no distress LUNGS:  Clear to auscultation, except decreased BS at the right base (chronic).   Pulses: dorsalis pedis intact bilat.   MSK: no deformity of the feet CV: 1+ bilat leg edema Skin:  no ulcer on the feet.  normal color and temp on the feet. Neuro: sensation is intact to touch on the feet.   A1c=5.9% Lab Results  Component Value Date   CREATININE 0.72 08/14/2015   BUN 12 08/14/2015   NA 139 08/14/2015   K 3.6 08/14/2015   CL 102 08/14/2015   CO2  30 08/14/2015   BNP=normal    Assessment & Plan:  Type 2 DM: well-controlled.  Hypokalemia: well-replaced.  same [place]. Edema: localized.  We can't increase lasix.    Patient is advised the following: Patient Instructions  Please continue the same medications.  blood tests are requested for you today.  We'll let you know about the results.   Please come back for a "medicare wellness" appointment in 6 months (must be after 02/15/16).   [name], MD

## 2015-08-14 NOTE — Patient Instructions (Addendum)
Please continue the same medications.  blood tests are requested for you today.  We'll let you know about the results.   Please come back for a "medicare wellness" appointment in 6 months (must be after 02/15/16).

## 2015-08-16 ENCOUNTER — Ambulatory Visit: Payer: PRIVATE HEALTH INSURANCE | Admitting: Endocrinology

## 2015-08-20 ENCOUNTER — Encounter: Payer: Self-pay | Admitting: Family Medicine

## 2015-08-20 ENCOUNTER — Ambulatory Visit (INDEPENDENT_AMBULATORY_CARE_PROVIDER_SITE_OTHER): Payer: Medicare Other | Admitting: Family Medicine

## 2015-08-20 VITALS — BP 122/82 | HR 86

## 2015-08-20 DIAGNOSIS — M170 Bilateral primary osteoarthritis of knee: Secondary | ICD-10-CM | POA: Diagnosis not present

## 2015-08-20 DIAGNOSIS — M5136 Other intervertebral disc degeneration, lumbar region: Secondary | ICD-10-CM | POA: Diagnosis not present

## 2015-08-20 MED ORDER — TRAMADOL HCL 50 MG PO TABS: 50.0000 mg | ORAL_TABLET | Freq: Two times a day (BID) | ORAL | Status: DC | PRN

## 2015-08-20 NOTE — Assessment & Plan Note (Signed)
Known degenerative disc disease. We discussed with patient not having any radicular symptoms I do not feel that imaging is warranted. Given some tramadol for any breakthrough pain. We discussed synergistic effect with Tylenol. Encourage her to take her other medications on a more regular basis with her seemingly making improvement previously. Follow-up again in 4 weeks.

## 2015-08-20 NOTE — Patient Instructions (Signed)
Good to see you  Ice is your friend We injected the knees Tramadol up to 2 times daily and taking it with tylenol can help Lets hope this all calms down See me again in 4 weeks and if not better we may try another type of injections in the knees.  Tell your husband hello

## 2015-08-20 NOTE — Assessment & Plan Note (Signed)
Encourage weight loss.

## 2015-08-20 NOTE — Progress Notes (Signed)
Heather [name],  Heather [name] Subjective:    I'm seeing this patient by the request  of:  [name], MD   CC: back and bilateral knee pain Follow-up  Heather [name] is a 70 y.o. female coming in with complaint of muscle aches and pains. Patient is completely within her back is seem to be more stiff.  Patient at last exam did have x-rays. These were reviewed and independently visualized by me today. X-rays the patient's back showing moderate degenerative disc disease at multiple levels as well as moderate to severe facet arthritic changes.  Patient has been doing better for quite some time. Unfortunately had a fall. Fell directly onto her face as well as the anterior aspect of her knees. Since then has had increasing chronic low back pain. No radiation the pain. Just states that it seems more stiff than it had been recently. No other significant change. Not taking the medications as regularly as she was doing previously. Not taking the gabapentin regularly as well.  Patient also had bilateral knee pain. Worse since the fall. Patient did have an injection in her right knee previously that didn't make significant improvement. States that she is having worsening symptoms at this time. Mild instability. Patient states that it is affecting daily activities. Walking with the aid of a cane. Patient states that she thinks it may be some swelling but has not been able to see it. Causing some achiness in her legs patient does not know if it is from the knees or her back.   Past Medical History  Diagnosis Date  . ALLERGIC RHINITIS 10/22/2006  . ANEMIA 12/18/2008  . ASYMPTOMATIC POSTMENOPAUSAL STATUS 11/22/2007  . DIABETES MELLITUS, TYPE II 01/13/2007  . GERD 07/20/2007  . GOITER, MULTINODULAR 07/20/2007  . Headache(784.0) 07/20/2007  . HEARING LOSS 11/22/2007  . HYPERCHOLESTEROLEMIA 01/13/2007  . HYPERTENSION  10/22/2006  . OSTEOARTHRITIS 10/22/2006  . Other chronic nonalcoholic liver disease 3/54/5625  . Hyperglycemia   . NASH (nonalcoholic steatohepatitis)   . Fibroid   . Hx of colposcopy with cervical biopsy   . Abnormal Pap smear   . Complication of anesthesia   . Dyslipidemia   . Obesity   . Obstructive sleep apnea   . Degenerative arthritis   . Nocturnal hypoxemia 02/17/2013   Past Surgical History  Procedure Laterality Date  . Esophagogastroduodenoscopy  12/08/2005  . Stress cardiolite  10/21/2005  . Electrocardiogram  10/15/2006  . Dexa  08/2005  . Wisdom tooth extraction    . Breast surgery      Breast reduction  . Sweat gland removal    . Dilation and curettage of uterus    . Eye surgery     Social History  Substance Use Topics  . Smoking status: Former Smoker -- 2.00 packs/day for 20 years    Types: Cigarettes    Quit date: 03/03/1979  . Smokeless tobacco: Never Used  . Alcohol Use: No   Allergies  Allergen Reactions  . Aspirin   . Coconut Oil   . Latex   . Lisinopril     REACTION: cough  . Metoprolol Itching  . Peanut-Containing Drug Products   . Penicillins     REACTION: rash  . Prednisone   . Strawberry Extract    Family History  Problem Relation Age of Onset  . Cancer Mother     Breast Cancer, Colon Cancer  . Asthma Mother   .  Depression Mother   . Bipolar disorder Mother   . Dementia Mother   . Arthritis Mother   . Hypertension Father   . Migraines Father   . Cancer Maternal Aunt   . Heart disease Maternal Grandmother   . Cancer Maternal Grandfather     Past medical history, social, surgical and family history all reviewed in electronic medical record.   Review of Systems: No headache, visual changes, nausea, vomiting, diarrhea, constipation, dizziness, abdominal pain, skin rash, fevers, chills, night sweats, weight loss, swollen lymph nodes,chest pain, shortness of breath, mood changes.   Objective Blood pressure 122/82, pulse 86.    General: No apparent distress alert and oriented x3 mood and affect normal, dressed appropriately. Morbidly obese HEENT: Pupils equal, extraocular movements intact  Respiratory: Patient's speak in full sentences and does not appear short of breath  Cardiovascular: No lower extremity edema, non tender, no erythema  Skin: Warm dry intact with no signs of infection or rash on extremities or on axial skeleton.  Abdomen: Soft nontender  Neuro: Cranial nerves II through XII are intact, neurovascularly intact in all extremities with 2+ DTRs and 2+ pulses.  Lymph: No lymphadenopathy of posterior or anterior cervical chain or axillae bilaterally.  Gait normal with good balance and coordination.  MSK:  Non tender with full range of motion and good stability and symmetric strength and tone of shoulders, elbows, wrist, hip, and ankles bilaterally.  Knee:bilateral Difficult to assess secondary to patient's body habitus Severe tenderness over the medial and lateral joint lines bilaterally right greater left Lacking the last 5 of flexion on the right side than the last 2 of extension of the left side Mild instability noted of the knees bilaterally with valgus force Negative Mcmurray's, Apley's, and Thessalonian tests. mild painful patellar compression. Patellar glide moderatecrepitus. Patellar and quadriceps tendons unremarkable. Hamstring and quadriceps strength is normal.   Back Exam:  Inspection: Unremarkable difficult to assess secondary to body habitus Motion: Flexion 45 deg, Extension 15 deg, Side Bending to 35 deg bilaterally,  Rotation to 45 deg bilaterally  SLR laying: Negative tightest hamstring bilaterally. XSLR laying: Negative   Mild tenderness paraspinal muscular tenderness seems to be only on the left side. FABER: unable to dizziness there to body habitus Sensory change: Gross sensation intact to all lumbar and sacral dermatomes.  Reflexes: 2+ at both patellar tendons, 2+ at  achilles tendons, Babinski's downgoing.  Strength at foot  Plantar-flexion: 5/5 Dorsi-flexion: 5/5 Eversion: 5/5 Inversion: 5/5  Leg strength  Quad: 5/5 Hamstring: 4-5 Hip flexor: 4-5 Hip abductors: 4-5  Gait unremarkable. Still mild weakness but no worse than previous exam. . After informed written and verbal consent, patient was seated on exam table. Right knee was prepped with alcohol swab and utilizing anterolateral approach, patient's right knee space was injected with 4:1  marcaine 0.5%: Kenalog 27m/dL. Patient tolerated the procedure well without immediate complications.  After informed written and verbal consent, patient was seated on exam table. Left knee was prepped with alcohol swab and utilizing anterolateral approach, patient's left knee space was injected with 4:1  marcaine 0.5%: Kenalog 457mdL. Patient tolerated the procedure well without immediate complications.   Impression and Recommendations:     This case required medical decision making of moderate complexity.

## 2015-08-20 NOTE — Assessment & Plan Note (Signed)
Patient did have bilateral injections done. We discussed icing regimen, home exercises, which activities to do an which was potentially avoid. We discussed with patient she would be a candidate for viscous supplementation secondary to the severity of arthritis in her knees. Patient come back in 4 weeks and if worsening symptoms we'll consider this. We discussed before patient could be a candidate for aquatic therapy as well.

## 2015-08-30 ENCOUNTER — Encounter: Payer: Self-pay | Admitting: Endocrinology

## 2015-09-17 ENCOUNTER — Ambulatory Visit (INDEPENDENT_AMBULATORY_CARE_PROVIDER_SITE_OTHER): Payer: Medicare Other | Admitting: Family Medicine

## 2015-09-17 ENCOUNTER — Encounter: Payer: Self-pay | Admitting: Family Medicine

## 2015-09-17 VITALS — BP 120/80 | HR 82

## 2015-09-17 DIAGNOSIS — M170 Bilateral primary osteoarthritis of knee: Secondary | ICD-10-CM | POA: Diagnosis not present

## 2015-09-17 NOTE — Assessment & Plan Note (Signed)
Patient given injection of viscous supplementation today. I'm hoping that it will be beneficial. We discussed icing regimen and home exercises. Discussed which activities to do in which ones to avoid. We discussed again about custom bracing. We discussed to the patient likely will need to have a joint replacement at some time in the future. Follow-up again in 4 weeks.  Spent  25 minutes with patient face-to-face and had greater than 50% of counseling including as described above in assessment and plan.
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
## 2015-09-17 NOTE — Patient Instructions (Signed)
Good to see you  We tried monovisc today, I am hoping for 6 months of relief.  COntinue the medicines and try to do them more regularly.  Lower impact exercises otherwise such as the pool or biking would be great  Ice or heat would be fine Wear good shoes See me again in 4 weeks if not great  Otherwise see me when you need me

## 2015-10-02 ENCOUNTER — Encounter: Payer: Self-pay | Admitting: Endocrinology

## 2015-10-07 ENCOUNTER — Ambulatory Visit (INDEPENDENT_AMBULATORY_CARE_PROVIDER_SITE_OTHER): Payer: Medicare Other | Admitting: Podiatry

## 2015-10-07 DIAGNOSIS — Q828 Other specified congenital malformations of skin: Secondary | ICD-10-CM | POA: Diagnosis not present

## 2015-10-07 DIAGNOSIS — E1140 Type 2 diabetes mellitus with diabetic neuropathy, unspecified: Secondary | ICD-10-CM

## 2015-10-07 DIAGNOSIS — E1149 Type 2 diabetes mellitus with other diabetic neurological complication: Secondary | ICD-10-CM

## 2015-10-07 DIAGNOSIS — M79675 Pain in left toe(s): Secondary | ICD-10-CM

## 2015-10-07 DIAGNOSIS — B351 Tinea unguium: Secondary | ICD-10-CM

## 2015-10-07 DIAGNOSIS — M79674 Pain in right toe(s): Secondary | ICD-10-CM

## 2015-10-08 NOTE — Progress Notes (Signed)
Subjective:     Patient ID: Heather [name], female   DOB: [birth year], 70 y.o.   MRN: [id number]  HPI long-term diabetic presents with severe keratotic lesion sub-first metatarsal bilateral and hallux and nail disease 1-5 both feet with concerns about a crack right hallux nail. States they all do bother her   Review of Systems     Objective:   Physical Exam Neurovascular status intact muscle strength was found to be normal with patient having diminished sharp dull and vibratory bilateral. Patient has thick nail disease 1-5 bilateral with incurvation and has keratotic lesion sub-metatarsals bilateral with thick tissue formation and lesion formation    Assessment:     Mycotic nail infection with pain 1-5 both feet with porokeratotic type lesions bilateral    Plan:     H&P conditions reviewed and debridement of nailbeds 1-5 both feet accomplished with debridement of lesions on both feet accomplished with no iatrogenic bleeding noted

## 2015-10-15 ENCOUNTER — Ambulatory Visit: Payer: Medicare Other | Admitting: Family Medicine

## 2015-10-26 ENCOUNTER — Encounter: Payer: Self-pay | Admitting: Endocrinology

## 2015-11-14 NOTE — Progress Notes (Deleted)
[name] [address], [place] [address] Phone: [phone] Subjective:    I'm seeing this patient by the request  of:  [name], MD   CC: Bilateral knee pain follow-up  [id number]  Heather [name] is a 70 y.o. female coming in with complaint of right Knee pain. Patient was seen 2 months ago and was given a Monovisc injection. Patient states.  X-rays have confirmed severe osteoarthritic changes.   Past Medical History:  Diagnosis Date  . Abnormal Pap smear   . ALLERGIC RHINITIS 10/22/2006  . ANEMIA 12/18/2008  . ASYMPTOMATIC POSTMENOPAUSAL STATUS 11/22/2007  . Complication of anesthesia   . Degenerative arthritis   . DIABETES MELLITUS, TYPE II 01/13/2007  . Dyslipidemia   . Fibroid   . GERD 07/20/2007  . GOITER, MULTINODULAR 07/20/2007  . Headache(784.0) 07/20/2007  . HEARING LOSS 11/22/2007  . Hx of colposcopy with cervical biopsy   . HYPERCHOLESTEROLEMIA 01/13/2007  . Hyperglycemia   . HYPERTENSION 10/22/2006  . NASH (nonalcoholic steatohepatitis)   . Nocturnal hypoxemia 02/17/2013  . Obesity   . Obstructive sleep apnea   . OSTEOARTHRITIS 10/22/2006  . Other chronic nonalcoholic liver disease 3/87/5643   Past Surgical History:  Procedure Laterality Date  . BREAST SURGERY     Breast reduction  . DEXA  08/2005  . DILATION AND CURETTAGE OF UTERUS    . ELECTROCARDIOGRAM  10/15/2006  . ESOPHAGOGASTRODUODENOSCOPY  12/08/2005  . EYE SURGERY    . Stress Cardiolite  10/21/2005  . sweat gland removal    . WISDOM TOOTH EXTRACTION     Social History  Substance Use Topics  . Smoking status: Former Smoker    Packs/day: 2.00    Years: 20.00    Types: Cigarettes    Quit date: 03/03/1979  . Smokeless tobacco: Never Used  . Alcohol use No   Allergies  Allergen Reactions  . Aspirin   . Coconut Oil   . Latex   . Lisinopril     REACTION: cough  . Metoprolol Itching  . Peanut-Containing Drug Products   . Penicillins    REACTION: rash  . Prednisone   . Strawberry Extract    Family History  Problem Relation Age of Onset  . Cancer Mother     Breast Cancer, Colon Cancer  . Asthma Mother   . Depression Mother   . Bipolar disorder Mother   . Dementia Mother   . Arthritis Mother   . Hypertension Father   . Migraines Father   . Cancer Maternal Aunt   . Heart disease Maternal Grandmother   . Cancer Maternal Grandfather     Past medical history, social, surgical and family history all reviewed in electronic medical record.   Review of Systems: No headache, visual changes, nausea, vomiting, diarrhea, constipation, dizziness, abdominal pain, skin rash, fevers, chills, night sweats, weight loss, swollen lymph nodes,chest pain, shortness of breath, mood changes.   Objective  There were no vitals taken for this visit.  General: No apparent distress alert and oriented x3 mood and affect normal, dressed appropriately. Morbidly obese HEENT: Pupils equal, extraocular movements intact  Respiratory: Patient's speak in full sentences and does not appear short of breath  Cardiovascular: No lower extremity edema, non tender, no erythema  Skin: Warm dry intact with no signs of infection or rash on extremities or on axial skeleton.  Abdomen: Soft nontender  Neuro: Cranial nerves II through XII are intact, neurovascularly intact in all extremities with  2+ DTRs and 2+ pulses.  Lymph: No lymphadenopathy of posterior or anterior cervical chain or axillae bilaterally.  Gait normal with good balance and coordination.  MSK:  Non tender with full range of motion and good stability and symmetric strength and tone of shoulders, elbows, wrist, hip, and ankles bilaterally.  Knee:bilateral Difficult to assess secondary to patient's body habitus Severe tenderness over the medial and lateral joint lines bilaterally right greater left Improved range of motion on the left sign the patient does have crepitus on the right side that is  giving pain. Mild instability noted of the knees bilaterally with valgus force, more on the right Negative Mcmurray's, Apley's, and Thessalonian tests. mild painful patellar compression. Patellar glide moderate crepitus with significant more on the right side. Patellar and quadriceps tendons unremarkable. Hamstring and quadriceps strength is normal.   . After informed written and verbal consent, patient was seated on exam table. Right knee was prepped with alcohol swab and utilizing anterolateral approach, patient's right knee space was injected with 25 mg/1 mL of Monovisc(sodium hyaluronate) in a prefilled syringe was injected easily into the knee through a 22-gauge needle.. Patient tolerated the procedure well without immediate complications. .   Impression and Recommendations:     This case required medical decision making of moderate complexity.

## 2015-11-15 ENCOUNTER — Ambulatory Visit: Payer: Medicare Other | Admitting: Family Medicine

## 2015-11-18 ENCOUNTER — Encounter: Payer: Self-pay | Admitting: Neurology

## 2015-11-24 NOTE — Progress Notes (Signed)
[name] [address], [place] [address] Phone: [phone] Subjective:    I'm seeing this patient by the request  of:  [name], MD   CC: Bilateral knee pain follow-up  [id number]  Heather [name] is a 70 y.o. female coming in with complaint of right Knee pain. Patient was seen 2 months ago and was given a Monovisc injection. Patient states  Unfortunately the pain has come back. Patient states that it still better. Having worsening pain in assessment. Still has some instability. Feels that the viscous supplementation only helped for one month.  X-rays have confirmed severe osteoarthritic changes.   Past Medical History:  Diagnosis Date  . Abnormal Pap smear   . ALLERGIC RHINITIS 10/22/2006  . ANEMIA 12/18/2008  . ASYMPTOMATIC POSTMENOPAUSAL STATUS 11/22/2007  . Complication of anesthesia   . Degenerative arthritis   . DIABETES MELLITUS, TYPE II 01/13/2007  . Dyslipidemia   . Fibroid   . GERD 07/20/2007  . GOITER, MULTINODULAR 07/20/2007  . Headache(784.0) 07/20/2007  . HEARING LOSS 11/22/2007  . Hx of colposcopy with cervical biopsy   . HYPERCHOLESTEROLEMIA 01/13/2007  . Hyperglycemia   . HYPERTENSION 10/22/2006  . NASH (nonalcoholic steatohepatitis)   . Nocturnal hypoxemia 02/17/2013  . Obesity   . Obstructive sleep apnea   . OSTEOARTHRITIS 10/22/2006  . Other chronic nonalcoholic liver disease 0/10/3816   Past Surgical History:  Procedure Laterality Date  . BREAST SURGERY     Breast reduction  . DEXA  08/2005  . DILATION AND CURETTAGE OF UTERUS    . ELECTROCARDIOGRAM  10/15/2006  . ESOPHAGOGASTRODUODENOSCOPY  12/08/2005  . EYE SURGERY    . Stress Cardiolite  10/21/2005  . sweat gland removal    . WISDOM TOOTH EXTRACTION     Social History  Substance Use Topics  . Smoking status: Former Smoker    Packs/day: 2.00    Years: 20.00    Types: Cigarettes    Quit date: 03/03/1979  . Smokeless tobacco: Never Used  .  Alcohol use No   Allergies  Allergen Reactions  . Aspirin   . Coconut Oil   . Latex   . Lisinopril     REACTION: cough  . Metoprolol Itching  . Peanut-Containing Drug Products   . Penicillins     REACTION: rash  . Prednisone   . Strawberry Extract    Family History  Problem Relation Age of Onset  . Cancer Mother     Breast Cancer, Colon Cancer  . Asthma Mother   . Depression Mother   . Bipolar disorder Mother   . Dementia Mother   . Arthritis Mother   . Hypertension Father   . Migraines Father   . Cancer Maternal Aunt   . Heart disease Maternal Grandmother   . Cancer Maternal Grandfather     Past medical history, social, surgical and family history all reviewed in electronic medical record.   Review of Systems: No headache, visual changes, nausea, vomiting, diarrhea, constipation, dizziness, abdominal pain, skin rash, fevers, chills, night sweats, weight loss, swollen lymph nodes,chest pain, shortness of breath, mood changes.   Objective  Blood pressure 118/82, pulse 85, weight 263 lb (119.3 kg), SpO2 97 %.  General: No apparent distress alert and oriented x3 mood and affect normal, dressed appropriately. Morbidly obese HEENT: Pupils equal, extraocular movements intact  Respiratory: Patient's speak in full sentences and does not appear short of breath  Cardiovascular: No lower extremity edema, non tender,  no erythema  Skin: Warm dry intact with no signs of infection or rash on extremities or on axial skeleton.  Abdomen: Soft nontender  Neuro: Cranial nerves II through XII are intact, neurovascularly intact in all extremities with 2+ DTRs and 2+ pulses.  Lymph: No lymphadenopathy of posterior or anterior cervical chain or axillae bilaterally.  Gait normal with good balance and coordination.  MSK:  Non tender with full range of motion and good stability and symmetric strength and tone of shoulders, elbows, wrist, hip, and ankles bilaterally.  Knee:Right Difficult to  assess secondary to patient's body habitus Increased tenderness on the right side mostly over the medial joint line. Does have crepitus with range of motion. Lacking the last 5 of flexion and the last 5 of extension Mild instability noted of the knees bilaterally with valgus force, more on the right Negative Mcmurray's, Apley's, and Thessalonian tests. mild painful patellar compression. Patellar glide moderate crepitus with significant more on the right side. Patellar and quadriceps tendons unremarkable. Hamstring and quadriceps strength is normal.   . After informed written and verbal consent, patient was seated on exam table. Right knee was prepped with alcohol swab and utilizing anterolateral approach, patient's right knee space was injected with 4:1  marcaine 0.5%: Kenalog 74m/dL. Patient tolerated the procedure well without immediate complications. .   Impression and Recommendations:     This case required medical decision making of moderate complexity.

## 2015-11-25 ENCOUNTER — Ambulatory Visit (INDEPENDENT_AMBULATORY_CARE_PROVIDER_SITE_OTHER): Payer: Medicare Other | Admitting: Family Medicine

## 2015-11-25 ENCOUNTER — Encounter: Payer: Self-pay | Admitting: Family Medicine

## 2015-11-25 VITALS — BP 118/82 | HR 85 | Wt 263.0 lb

## 2015-11-25 DIAGNOSIS — M170 Bilateral primary osteoarthritis of knee: Secondary | ICD-10-CM

## 2015-11-25 NOTE — Patient Instructions (Signed)
Good to see you  Heather [name] is your friend  Keep doing everything else you are doing.  Dr. Damita Dunnings office will be calling you. [place]  I fyou have questions send me a message If the injection works see me again in 10 weeks and we can do it again.

## 2015-11-25 NOTE — Assessment & Plan Note (Signed)
Injected with a corticosteroid injection at this time. We did discuss referring her for orthopedic surgery for further evaluation for joint replacement. Patient will consider this. We discussed icing regimen. Patient will continue with topical anti-inflammatories and can follow-up with me in 10-12 weeks if she would like to continue the steroid injections otherwise we have failed all conservative therapy.

## 2015-12-05 DIAGNOSIS — M170 Bilateral primary osteoarthritis of knee: Secondary | ICD-10-CM | POA: Diagnosis not present

## 2016-02-02 NOTE — Progress Notes (Signed)
[name] [name],  [address] Phone: [phone] Subjective:    I'm seeing this patient by the request  of:  [name], MD   CC: Bilateral knee pain follow-up  [id number]  Heather [name] is a 70 y.o. female coming in with complaint of right Knee pain. Patient was seen 5 months ago and was given a Monovisc injection. Patient states  Unfortunately the pain has come back. Patient states that it still better. Having worsening pain in assessment. Still has some instability. Feels that the viscous supplementation only helped for one month. Patient saw a physician about replacement but knows she needs to lose weight before surgical intervention. Patient is a the pain is starting to increase again. May have had an injection 3 months ago by the orthopedic physician but has not had another one since. Increasing pain and is affecting daily activities.   X-rays have confirmed severe osteoarthritic changes.   Past Medical History:  Diagnosis Date  . Abnormal Pap smear   . ALLERGIC RHINITIS 10/22/2006  . ANEMIA 12/18/2008  . ASYMPTOMATIC POSTMENOPAUSAL STATUS 11/22/2007  . Complication of anesthesia   . Degenerative arthritis   . DIABETES MELLITUS, TYPE II 01/13/2007  . Dyslipidemia   . Fibroid   . GERD 07/20/2007  . GOITER, MULTINODULAR 07/20/2007  . Headache(784.0) 07/20/2007  . HEARING LOSS 11/22/2007  . Hx of colposcopy with cervical biopsy   . HYPERCHOLESTEROLEMIA 01/13/2007  . Hyperglycemia   . HYPERTENSION 10/22/2006  . NASH (nonalcoholic steatohepatitis)   . Nocturnal hypoxemia 02/17/2013  . Obesity   . Obstructive sleep apnea   . OSTEOARTHRITIS 10/22/2006  . Other chronic nonalcoholic liver disease 10/10/5724   Past Surgical History:  Procedure Laterality Date  . BREAST SURGERY     Breast reduction  . DEXA  08/2005  . DILATION AND CURETTAGE OF UTERUS    . ELECTROCARDIOGRAM  10/15/2006  . ESOPHAGOGASTRODUODENOSCOPY   12/08/2005  . EYE SURGERY    . Stress Cardiolite  10/21/2005  . sweat gland removal    . WISDOM TOOTH EXTRACTION     Social History  Substance Use Topics  . Smoking status: Former Smoker    Packs/day: 2.00    Years: 20.00    Types: Cigarettes    Quit date: 03/03/1979  . Smokeless tobacco: Never Used  . Alcohol use No   Allergies  Allergen Reactions  . Aspirin   . Coconut Oil   . Latex   . Lisinopril     REACTION: cough  . Metoprolol Itching  . Peanut-Containing Drug Products   . Penicillins     REACTION: rash  . Prednisone   . Strawberry Extract    Family History  Problem Relation Age of Onset  . Cancer Mother     Breast Cancer, Colon Cancer  . Asthma Mother   . Depression Mother   . Bipolar disorder Mother   . Dementia Mother   . Arthritis Mother   . Hypertension Father   . Migraines Father   . Cancer Maternal Aunt   . Heart disease Maternal Grandmother   . Cancer Maternal Grandfather     Past medical history, social, surgical and family history all reviewed in electronic medical record.   Review of Systems: No headache, visual changes, nausea, vomiting, diarrhea, constipation, dizziness, abdominal pain, skin rash, fevers, chills, night sweats, weight loss, swollen lymph nodes,chest pain, shortness of breath, mood changes.   Objective  Blood pressure 128/76, pulse 92,  height 5' 4.5" (1.638 m), weight 264 lb (119.7 kg), SpO2 97 %.  Systems examined below as of 02/03/16 General: NAD A&O x3 mood, affect normal  HEENT: Pupils equal, extraocular movements intact no nystagmus Respiratory: not short of breath at rest or with speaking Cardiovascular: No lower extremity edema, non tender Skin: Warm dry intact with no signs of infection or rash on extremities or on axial skeleton. Abdomen: Soft nontender, no masses Neuro: Cranial nerves  intact, neurovascularly intact in all extremities with 2+ DTRs and 2+ pulses. Lymph: No lymphadenopathy appreciated today  Gait  Antalgic and patient does carry a cane..  MSK: Non tender with full range of motion and good stability and symmetric strength and tone of shoulders, elbows, wrist,  hips and ankles bilaterally.  Arthritic changes of multiple joints Knee: Bilateral Difficult to assess secondary to patient's habitus Increased tenderness medially bilaterally  Does have crepitus with range of motion. Lacking the last 5 of flexion and the last 5 of extension Mild instability noted of the knees bilaterally with valgus force, more on the right  mild painful patellar compression. Patellar glide moderate crepitus with significant more on the right side. Patellar and quadriceps tendons unremarkable. Hamstring and quadriceps strength is normal.   .After informed written and verbal consent, patient was seated on exam table. Right knee was prepped with alcohol swab and utilizing anterolateral approach, patient's right knee space was injected with 4:1  marcaine 0.5%: Kenalog 2m/dL. Patient tolerated the procedure well without immediate complications.  After informed written and verbal consent, patient was seated on exam table. Left knee was prepped with alcohol swab and utilizing anterolateral approach, patient's left knee space was injected with 4:1  marcaine 0.5%: Kenalog 450mdL. Patient tolerated the procedure well without immediate complications. .   Impression and Recommendations:     This case required medical decision making of moderate complexity.

## 2016-02-03 ENCOUNTER — Ambulatory Visit (INDEPENDENT_AMBULATORY_CARE_PROVIDER_SITE_OTHER): Payer: Medicare Other | Admitting: Family Medicine

## 2016-02-03 ENCOUNTER — Encounter: Payer: Self-pay | Admitting: Family Medicine

## 2016-02-03 DIAGNOSIS — M170 Bilateral primary osteoarthritis of knee: Secondary | ICD-10-CM

## 2016-02-03 NOTE — Assessment & Plan Note (Signed)
Bilateral injections given today. Patient encouraged to watch her blood sugars a little closer basis. We discussed icing regimen and home exercises. Discussed which activities to do in which ones to avoid. Patient's has been offered custom braces before but does not wear it on a regular basis. Patient will return again in 10 weeks. Can repeat viscous supplementation again if necessary next Month.

## 2016-02-03 NOTE — Patient Instructions (Addendum)
Good to see you  Injected again  Ice is your friend Stay active.  See me again in 10 weeks  Happy holidays!

## 2016-02-04 ENCOUNTER — Encounter: Payer: Self-pay | Admitting: Endocrinology

## 2016-02-11 ENCOUNTER — Ambulatory Visit: Payer: Medicare Other | Admitting: Nurse Practitioner

## 2016-02-11 ENCOUNTER — Other Ambulatory Visit: Payer: Self-pay | Admitting: Endocrinology

## 2016-02-11 ENCOUNTER — Telehealth: Payer: Self-pay | Admitting: *Deleted

## 2016-02-11 NOTE — Telephone Encounter (Signed)
Patient will be able to refill tomorrow at office visit.

## 2016-02-12 ENCOUNTER — Encounter: Payer: Self-pay | Admitting: Neurology

## 2016-02-12 ENCOUNTER — Ambulatory Visit (INDEPENDENT_AMBULATORY_CARE_PROVIDER_SITE_OTHER): Payer: Medicare Other | Admitting: Neurology

## 2016-02-12 ENCOUNTER — Other Ambulatory Visit: Payer: Self-pay

## 2016-02-12 VITALS — BP 130/72 | HR 80 | Resp 16 | Ht 64.5 in | Wt 265.0 lb

## 2016-02-12 DIAGNOSIS — E876 Hypokalemia: Secondary | ICD-10-CM | POA: Insufficient documentation

## 2016-02-12 DIAGNOSIS — E662 Morbid (severe) obesity with alveolar hypoventilation: Secondary | ICD-10-CM | POA: Diagnosis not present

## 2016-02-12 DIAGNOSIS — D510 Vitamin B12 deficiency anemia due to intrinsic factor deficiency: Secondary | ICD-10-CM | POA: Diagnosis not present

## 2016-02-12 DIAGNOSIS — M62838 Other muscle spasm: Secondary | ICD-10-CM

## 2016-02-12 DIAGNOSIS — M25472 Effusion, left ankle: Secondary | ICD-10-CM | POA: Diagnosis not present

## 2016-02-12 DIAGNOSIS — G4733 Obstructive sleep apnea (adult) (pediatric): Secondary | ICD-10-CM

## 2016-02-12 DIAGNOSIS — R600 Localized edema: Secondary | ICD-10-CM | POA: Insufficient documentation

## 2016-02-12 DIAGNOSIS — Z9989 Dependence on other enabling machines and devices: Secondary | ICD-10-CM

## 2016-02-12 DIAGNOSIS — R0902 Hypoxemia: Secondary | ICD-10-CM

## 2016-02-12 DIAGNOSIS — M25471 Effusion, right ankle: Secondary | ICD-10-CM

## 2016-02-12 DIAGNOSIS — D509 Iron deficiency anemia, unspecified: Secondary | ICD-10-CM | POA: Diagnosis not present

## 2016-02-12 DIAGNOSIS — G2581 Restless legs syndrome: Secondary | ICD-10-CM | POA: Diagnosis not present

## 2016-02-12 MED ORDER — TOPIRAMATE 100 MG PO TABS: 100.0000 mg | ORAL_TABLET | Freq: Every day | ORAL | 3 refills | Status: DC

## 2016-02-12 NOTE — Addendum Note (Signed)
Addended by: Larey Seat on: 02/12/2016 11:32 AM   Modules accepted: Orders

## 2016-02-12 NOTE — Progress Notes (Signed)
[facility]  Provider:  Larey Seat, M D  Referring Provider: [name], MD Primary Care Physician:  Heather Shin, MD  Chief Complaint  Patient presents with  . Follow-up    Rm 10. No new concerns. Needs refill of Topamax and has a question about her Imitrex    HPI:  Heather [name] is a 70 y.o. female ,seen here as a revisit after her sleep study.     Heather [name], a  70 year old right-handed [place] American female , is a former patient of Heather [name] and is followed now by Heather [name] here at [facility]. The patient's pulmonologist, Heather [name], recommended a review of her sleep breathing disorder in light of a new  diagnosis of nocturnal hypoxemia. The patient desaturates according to her her husband and from recollection of ONO data  into the 60s and 70s at night ( as documented on a home based  pulse oximetry). Cardiologist evaluation  found hypoxemia . The patient started per cardiologist on Losartin and ASA. She was referred by Dr. Doy Mince in June 2011 for a sleep study based on chief complaints of snoring, witnessed apneas and excessive daytime sleepiness. At the time the patient endorsed  the Epworth sleepiness score at a very high count of 20/24 points and the Beck's inventory at 3 points, her BMI was 49.9, and her neck conference measured 14.5 inches . She was diagnosed with OSA after her study revealed an AHI of 11.7 (which constitutes mild apnea). During REM sleep, her AHI increased to 68.2/hr.  The oxygen  desaturations associated with REM sleep in supine sleep position  were suspected to be a cause for the patient's excessive daytime sleepiness and the reported morning headaches. The patient had reported at that time that she would prefer to sleep up sitting in a chair or recliner.I saw Heather [name] last she has been compliantly using her BiPAP machine.  she underwent a split night polysomnography on 12-13-12 and  had endorsed the Epworth sleepiness score at 21 points/  her neck circumference was 14.5 inches/  BMI 48.4.  The  patient had an AHI of 46.8 which constitutes severe apnea RDI was 48.7/ hr . The Co2 retention was not found (by a CMS criteria) but she was at a level of 49.4 torr, very close to the threshold of 50 Torr. [name] Respiratory events were hypopneas and not frank apneas. Oxygen lowest saturation was 75% but 110 minutes of desaturation during the baseline part of this but study. The patient was titrated to a BiPAP setting of 11/7 cm water under which she slept 120 minutes and reached finally REM sleep. The patient tolerated BiPAP much better than CPAP , an ESON  medium size nasal mask was used . She had prolonged hypoxemia persisted in spite of CPAP and BiPAP use at various times during the night -02  nadir of 78% during the in lab titration.   06-21-13  The last compliance data for a 30 day download were dated 04-07-13. The patient's compliance was 90%, average time of CPAP or BiPAP use was 5 hours and 43 minutes. The patient uses a BiPAP machine a so-called easy breath ,  inspiratory pressure support of 11 cm of water and expiratory pressure of 7 cm. The patient has an AHI of 0.9, she is compliant  By CMS criteria  was able to compared downloads from [facility] beginning November 2014 to our in office data. The patient is  using BiPAP  machine and has an inspiratory pressure of 11 cm and expiratory pressure of 7 cm water.  She has less nocturia and less dry mouth in the morning.   Taking Lasix to late will cause her nocturia. She is in PT .  02-12-14 Heather [name]'s compliance reports dating from 23-13 through 02-10-14 shows 100% compliance 4 days of use and 93% compliance for over 4 hours of daily use. Average use of the machine is 5 hours and 12 minutes. The IPAP is set at 11 cm and the expiratory pressure at 7 cm AHI residual at 0.9 with excellent she has a moderate air leak but it does not  seem to influence the overall apnea count I would not like to change any of the settings I would offer to change the mask should the patient feels that there is a problem with the fit.  FSS 49 and Epworth 15 , still elevated, BMI still severely elevated.   Sleep clinic 02-11-15, The patient has a CPAP and an oxygen concentrator available to her. She had a lot of congestion and upper airway irritation over the last week and felt that it helped her to be less air hungry. She has an excellent report on compliance she's 100% compliance for the last 30 days, 24 of those days was over 4 hours of consecutive use. She uses an inspiratory pressure of 11 expiratory pressure of 7 cm water BiPAP is a residual AHI of 1.3. She has mild to moderate air leaks. There is no reason to change any settings at this time. She still endorsed the Epworth sleepiness score today at 19 points but attributes this also to her current struggle with an upper airway infection.  I am concerned that she is sleepy to this degree by using her CPAP compliantly. I offered modafinil to treat the sleepiness and daytime medically but she declined. 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   Interval history from 02/29/2016, Heather [name] is here today for her routine CPAP compliance visit. She is currently walking on a multi pronged cane, she went camping couple of weeks ago with her girl [employer] -she remains active physically and socially involved. I have also her compliance data here available, she has used CPAP 83 out of 90 days was 92% compliance and an average user time of 4 hours and 22 minutes. She is using an AutoPap BiPAP between 7 and 11 cm water pressure, with a residual AHI of 0.7. She does have some moderate to  severe air leaks. These have not reduced the apnea control and I would therefore he can more than I hope she can continue to use CPAP with high compliance and work on average over 4 hours each night. I'm very happy with the resolution of apnea and its positive effect on her headaches as well. Her daytime level of alertness and her fatigue level are still elevated fatigue severity score at 43, Epworth sleepiness score at 20 points, but she has not used the prescribed modafinil. She has nights where she can barely get to sleep because of leg cramping like pain, she will wake up with a lack cramping it is sharp- spasticity that she describes,  not necessarily a discomfort that would go  away by rubbing on massaging her legs. At times lancinating pain. Sometimes the spasm will not resolve, she ad painfull spasm during aqua gymnastic.  She has sometimes found relief by taking potassium, diuretics may provoke this- lasix. 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  She reports struggling with e anemia on and off.  Will check.     Review of Systems: Out of a complete 14 system review, the patient complains of only the following symptoms, and all other reviewed systems are negative. The patient has SOB, morbidly obese, has CHF and ankle edema.  Epworth is  Again 20, was  20 pre CPAP,  FSS  Is 55   GDS at 2 points ,  No falls.     Social History   Social History  . Marital status: Married    Spouse name: [name]  . Number of children: 2  . Years of education: College   Occupational History  . Retired    Social History Main Topics  . Smoking status: Former Smoker    Packs/day: 2.00    Years: 20.00    Types: Cigarettes    Quit date: 03/03/1979  . Smokeless tobacco: Never Used  . Alcohol use No  . Drug use: No  . Sexual activity: Yes    Birth control/ protection: Surgical   Other Topics Concern  . Not on file   Social  History Narrative   Patient is married [name]).   Patient has two children.   Patient is retired.   Patient has a college education.   Patient is right-handed.   Patient lives at home with family.   Caffeine Use: 2 soda every other day    Family History  Problem Relation Age of Onset  . Cancer Mother     Breast Cancer, Colon Cancer  . Asthma Mother   . Depression Mother   . Bipolar disorder Mother   . Dementia Mother   . Arthritis Mother   . Hypertension Father   . Migraines Father   . Cancer Maternal Aunt   . Heart disease Maternal Grandmother   . Cancer Maternal Grandfather     Past Medical History:  Diagnosis Date  . Abnormal Pap smear   . ALLERGIC RHINITIS 10/22/2006  . ANEMIA 12/18/2008  . ASYMPTOMATIC POSTMENOPAUSAL STATUS 11/22/2007  . Complication of anesthesia   . Degenerative arthritis   . DIABETES MELLITUS, TYPE II 01/13/2007  . Dyslipidemia   . Fibroid   . GERD 07/20/2007  . GOITER, MULTINODULAR 07/20/2007  . Headache(784.0) 07/20/2007  . HEARING LOSS 11/22/2007  . Hx of colposcopy with cervical biopsy   . HYPERCHOLESTEROLEMIA 01/13/2007  . Hyperglycemia   . HYPERTENSION 10/22/2006  . NASH (nonalcoholic steatohepatitis)   . Nocturnal hypoxemia 02/17/2013  . Obesity   . Obstructive sleep apnea   . OSTEOARTHRITIS 10/22/2006  . Other chronic nonalcoholic liver disease 5/69/7948    Past Surgical History:  Procedure Laterality Date  . BREAST SURGERY     Breast reduction  . DEXA  08/2005  . DILATION AND CURETTAGE OF UTERUS    . ELECTROCARDIOGRAM  10/15/2006  . ESOPHAGOGASTRODUODENOSCOPY  12/08/2005  . EYE SURGERY    . Stress Cardiolite  10/21/2005  . sweat gland removal    . WISDOM TOOTH EXTRACTION      Current Outpatient Prescriptions  Medication Sig Dispense Refill  . acetaminophen (TYLENOL) 500 MG tablet Take 650 mg by mouth 3 (  three) times daily as needed for pain.     . Calcium Carbonate-Vitamin D (CALCIUM 600 + D PO) Take 2 tablets by mouth  daily.      . clopidogrel (PLAVIX) 75 MG tablet TAKE 1 TABLET EVERY DAY 90 tablet 3  . Ferrous Sulfate (IRON) 325 (65 FE) MG TABS Take 1 tablet by mouth daily.      . furosemide (LASIX) 40 MG tablet Take 1 tablet (40 mg total) by mouth daily. 90 tablet 3  . gabapentin (NEURONTIN) 100 MG capsule Take 2 capsules (200 mg total) by mouth at bedtime. 60 capsule 3  . glucosamine-chondroitin 500-400 MG tablet Take 1 tablet by mouth 2 (two) times daily.      [name] losartan (COZAAR) 25 MG tablet TAKE 1 TABLET EVERY DAY 90 tablet 3  . metFORMIN (GLUCOPHAGE-XR) 500 MG 24 hr tablet TAKE 2 TABLETS TWICE DAILY 360 tablet 3  . modafinil (PROVIGIL) 200 MG tablet Take 1 tablet (200 mg total) by mouth daily. 30 tablet 1  . Multiple Vitamin (MULTIVITAMIN) tablet Take 1 tablet by mouth daily.      . naproxen sodium (ANAPROX) 220 MG tablet Take 220 mg by mouth as needed.    . NON FORMULARY Tumeric    . Nutritional Supplements (PEPTAMEN 1.5 PO) Take by mouth 2 (two) times daily.    [name] omeprazole (PRILOSEC) 20 MG capsule Take 20 mg by mouth daily as needed.     . potassium chloride SA (K-DUR,KLOR-CON) 20 MEQ tablet TAKE 1 TABLET EVERY DAY 90 tablet 3  . pseudoephedrine-acetaminophen (TYLENOL SINUS) 30-500 MG TABS tablet Take 1 tablet by mouth as needed.    . rosuvastatin (CRESTOR) 40 MG tablet TAKE 1 TABLET EVERY DAY 90 tablet 3  . SF 5000 PLUS 1.1 % CREA dental cream   0  . SUMAtriptan (IMITREX) 20 MG/ACT nasal spray Place 1 spray (20 mg total) into the nose every 2 (two) hours as needed for migraine. 12 Inhaler 1  . topiramate (TOPAMAX) 100 MG tablet Take 1 tablet (100 mg total) by mouth daily. 90 tablet 3  . traMADol (ULTRAM) 50 MG tablet Take 1 tablet (50 mg total) by mouth every 12 (twelve) hours as needed. 30 tablet 0  . vitamin E 200 UNIT capsule Take 200 Units by mouth daily.     No current facility-administered medications for this visit.     Allergies as of 02/12/2016 - Review Complete 02/12/2016  Allergen  Reaction Noted  . Aspirin  08/04/2011  . Coconut oil  07/10/2013  . Latex  08/03/2011  . Lisinopril  06/03/2006  . Metoprolol Itching 02/12/2014  . Peanut-containing drug products  07/10/2013  . Penicillins  06/03/2006  . Prednisone  07/12/2012  . Strawberry extract  07/10/2013    Vitals: BP 130/72   Pulse 80   Resp 16   Ht 5' 4.5" (1.638 m)   Wt 265 lb (120.2 kg)   BMI 44.78 kg/m  Last Weight:  Wt Readings from Last 1 Encounters:  02/12/16 265 lb (120.2 kg)   Last Height:   Ht Readings from Last 1 Encounters:  02/12/16 5' 4.5" (1.638 m)    Physical exam:  General: The patient is awake, alert and appears not in acute distress. The patient is well groomed. Head: Normocephalic, atraumatic.  Neck is supple. Mallampati 3,  With swollen and red uvula , lateral restriction of the upper air way through tonsillary swelling..  Neck circumference: 15.0 , no nasal deviation but slightly nasal  voice.  Cardiovascular:  Regular rate and rhythm , without  murmurs or carotid bruit, and without distended neck veins. Respiratory: Lungs are clear to auscultation. Skin:  With evidence of  Ankle edema 2 plus , no rash. Trunk: The patient has normal posture.  Neurologic exam : The patient is awake and alert, oriented to place and time.  Memory subjective described as impaired for short term memory .  There is a normal attention span & concentration ability.  She is very sleepy when not stimulated. She "deals with it' and would not like to use modafinil.   Speech is fluent without  dysarthria, dysphonia or aphasia. Mood and affect are appropriate.  Cranial nerves: No loss of taste and smell, Pupils are equal and briskly reactive to light. Funduscopic exam without  evidence of pallor or edema. She is status post cataract ,  Extraocular movements  in vertical and horizontal planes intact and without nystagmus. Visual fields by finger perimetry are intact. Hearing to finger rub intact.  Facial  sensation intact to fine touch. Facial motor strength is symmetric and tongue and uvula move midline. Gait and station: Patient walks without assistive device - her stance is wide based. Walking with a cane.   Tandem gait is fragmented. She has trouble turning, has  trouble managing stairs , one foot at a time.   The patient walks deliberately slow and measured.  Reported no fallls in 30 month.    Assessment:  25 minute RV with more than 50% of the face to face time dedicated to coordination of care, answering questions and establishing that current therapy is still the patient's best option. Almost resolved headaches on CPAP, she had one headache recently that she release was triggered by the weather changing on a snow day.   This patient has a high compliance to CPAP, her OSA is excellently controlled, yet her Epworth score had risen to 20 points.  I had prescribed modafinil for the patient and she received the medication but she had no need in the meantime to take it. Modafinil was meant to be used when necessary for example on a long distance drive or when attending a conference or a social event that would otherwise be very fatiguing to her. After physical and neurologic examination, review of sleep studies, and pre-existing records, assessment is that of a patient with morbid obesity , morning headaches and known OSA , she finally lost some weight .  Her headaches have been treated by Dr Jannifer Franklin.  She has documented Edema, SOB and ONO documented hypoxia at night.   Plan:  Treatment plan and additional workup :   She  has been using an oxygen concentrator based on January 2015 ONO result with 2 .21 hours of hypoxemia while on PAP-  since then she noted improvement in Epworth and FSS.  Rv in sleep clinic every 12 month sleep clinic with PAP machine.  I have ordered modafinil for prn use. I will check  B12, iron and copper levels, RLS and frequent leg spasms, patient on diuretics and with  history of recurrent  Anemia.     Her headaches have resolved on CPAP , Next visit with  Cecille Rubin .   [name]   Shirleen Mcfaul, MD    Floyde Parkins, MD

## 2016-02-13 ENCOUNTER — Ambulatory Visit: Payer: Medicare Other | Admitting: Endocrinology

## 2016-02-17 ENCOUNTER — Ambulatory Visit: Payer: Medicare Other | Admitting: Endocrinology

## 2016-02-18 LAB — IRON AND TIBC
Iron Saturation: 13 % — ABNORMAL LOW (ref 15–55)
Iron: 35 ug/dL (ref 27–139)
TIBC: 265 ug/dL (ref 250–450)
UIBC: 230 ug/dL (ref 118–369)

## 2016-02-18 LAB — CERULOPLASMIN: CERULOPLASMIN: 38.5 mg/dL (ref 19.0–39.0)

## 2016-02-18 LAB — FERRITIN: FERRITIN: 61 ng/mL (ref 15–150)

## 2016-02-18 LAB — METHYLMALONIC ACID, SERUM: Methylmalonic Acid: 184 nmol/L (ref 0–378)

## 2016-02-19 ENCOUNTER — Telehealth: Payer: Self-pay

## 2016-02-19 NOTE — Telephone Encounter (Signed)
I spoke to patient and she is aware of results and recommendations.

## 2016-02-19 NOTE — Telephone Encounter (Signed)
-----   Message from Larey Seat, MD sent at 02/18/2016  4:54 PM EST ----- Normal B 12. Normal copper, , normal ferritin. Low iron saturation , no IV iron necessary.  Take a multivitamin with iron, such as prenatal,  And take  just one a day. It will keep iron normal.

## 2016-03-05 ENCOUNTER — Encounter: Payer: Self-pay | Admitting: Endocrinology

## 2016-03-05 ENCOUNTER — Ambulatory Visit (INDEPENDENT_AMBULATORY_CARE_PROVIDER_SITE_OTHER): Payer: Medicare Other | Admitting: Endocrinology

## 2016-03-05 VITALS — BP 122/80 | HR 97 | Ht 64.5 in | Wt 264.0 lb

## 2016-03-05 DIAGNOSIS — Z0000 Encounter for general adult medical examination without abnormal findings: Secondary | ICD-10-CM | POA: Diagnosis not present

## 2016-03-05 DIAGNOSIS — R519 Headache, unspecified: Secondary | ICD-10-CM

## 2016-03-05 DIAGNOSIS — R51 Headache: Secondary | ICD-10-CM | POA: Diagnosis not present

## 2016-03-05 DIAGNOSIS — E119 Type 2 diabetes mellitus without complications: Secondary | ICD-10-CM

## 2016-03-05 DIAGNOSIS — E042 Nontoxic multinodular goiter: Secondary | ICD-10-CM | POA: Diagnosis not present

## 2016-03-05 DIAGNOSIS — R700 Elevated erythrocyte sedimentation rate: Secondary | ICD-10-CM

## 2016-03-05 DIAGNOSIS — Z23 Encounter for immunization: Secondary | ICD-10-CM

## 2016-03-05 LAB — BASIC METABOLIC PANEL
BUN: 15 mg/dL (ref 6–23)
CALCIUM: 9.4 mg/dL (ref 8.4–10.5)
CO2: 33 mEq/L — ABNORMAL HIGH (ref 19–32)
Chloride: 102 mEq/L (ref 96–112)
Creatinine, Ser: 0.87 mg/dL (ref 0.40–1.20)
GFR: 82.94 mL/min (ref >60.00)
GLUCOSE: 116 mg/dL — AB (ref 70–99)
POTASSIUM: 3.5 meq/L (ref 3.5–5.1)
SODIUM: 140 meq/L (ref 135–145)

## 2016-03-05 LAB — URINALYSIS, ROUTINE W REFLEX MICROSCOPIC
BILIRUBIN URINE: NEGATIVE
Hgb urine dipstick: NEGATIVE
LEUKOCYTES UA: NEGATIVE
Nitrite: NEGATIVE
RBC / HPF: NONE SEEN (ref 0–?)
SPECIFIC GRAVITY, URINE: 1.02 (ref 1.000–1.030)
TOTAL PROTEIN, URINE-UPE24: NEGATIVE
URINE GLUCOSE: NEGATIVE
Urobilinogen, UA: 0.2 (ref 0.0–1.0)
pH: 6.5 (ref 5.0–8.0)

## 2016-03-05 LAB — CBC WITH DIFFERENTIAL/PLATELET
BASOS ABS: 0 10*3/uL (ref 0.0–0.1)
Basophils Relative: 0.2 % (ref 0.0–3.0)
EOS PCT: 1.2 % (ref 0.0–5.0)
Eosinophils Absolute: 0.1 10*3/uL (ref 0.0–0.7)
HCT: 38.8 % (ref 36.0–46.0)
HEMOGLOBIN: 12.8 g/dL (ref 12.0–15.0)
LYMPHS ABS: 1.5 10*3/uL (ref 0.7–4.0)
Lymphocytes Relative: 21.6 % (ref 12.0–46.0)
MCHC: 32.9 g/dL (ref 30.0–36.0)
MCV: 87.1 fl (ref 78.0–100.0)
MONO ABS: 0.4 10*3/uL (ref 0.1–1.0)
MONOS PCT: 5.4 % (ref 3.0–12.0)
Neutro Abs: 5.1 10*3/uL (ref 1.4–7.7)
Neutrophils Relative %: 71.6 % (ref 43.0–77.0)
Platelets: 300 10*3/uL (ref 150.0–400.0)
RBC: 4.46 Mil/uL (ref 3.87–5.11)
RDW: 18.1 % — ABNORMAL HIGH (ref 11.5–15.5)
WBC: 7.2 10*3/uL (ref 4.0–10.5)

## 2016-03-05 LAB — LIPID PANEL
CHOL/HDL RATIO: 3
Cholesterol: 170 mg/dL (ref 0–200)
HDL: 65.5 mg/dL (ref >39.00)
LDL CALC: 78 mg/dL (ref 0–99)
NONHDL: 104.33
Triglycerides: 130 mg/dL (ref 0.0–149.0)
VLDL: 26 mg/dL (ref 0.0–40.0)

## 2016-03-05 LAB — MICROALBUMIN / CREATININE URINE RATIO
Creatinine,U: 271.7 mg/dL
MICROALB UR: 2 mg/dL — AB (ref 0.0–1.9)
Microalb Creat Ratio: 0.7 mg/g (ref 0.0–30.0)

## 2016-03-05 LAB — SEDIMENTATION RATE: Sed Rate: 130 mm/hr — ABNORMAL HIGH (ref 0–30)

## 2016-03-05 LAB — TSH: TSH: 1.76 u[IU]/mL (ref 0.35–4.50)

## 2016-03-05 LAB — HEMOGLOBIN A1C: HEMOGLOBIN A1C: 6.4 % (ref 4.6–6.5)

## 2016-03-05 NOTE — Progress Notes (Signed)
we discussed code status.  pt requests full code, but would not want to be started or maintained on artificial life-support measures if there was not a reasonable chance of recovery 

## 2016-03-05 NOTE — Progress Notes (Signed)
Subjective:    Patient ID: Heather [name], female    DOB: 1946-02-01, 71 y.o.   MRN: [id number]  HPI  Pt returns for f/u of diabetes mellitus: DM type: 2 Dx'ed: 4034 Complications: none Therapy: metformin.  GDM: never DKA: never Severe hypoglycemia: never. Pancreatitis: never. Other: she did not tolerate actos (edema); pt says she was told by surgeon she was not a good candidate for weight loss surgery, due to abd hernia; she does not check cbg's.  Interval history: pt reports intermittent mild left temporal headache.  No assoc earache.  Past Medical History:  Diagnosis Date  . Abnormal Pap smear   . ALLERGIC RHINITIS 10/22/2006  . ANEMIA 12/18/2008  . ASYMPTOMATIC POSTMENOPAUSAL STATUS 11/22/2007  . Complication of anesthesia   . Degenerative arthritis   . DIABETES MELLITUS, TYPE II 01/13/2007  . Dyslipidemia   . Fibroid   . GERD 07/20/2007  . GOITER, MULTINODULAR 07/20/2007  . Headache(784.0) 07/20/2007  . HEARING LOSS 11/22/2007  . Hx of colposcopy with cervical biopsy   . HYPERCHOLESTEROLEMIA 01/13/2007  . Hyperglycemia   . HYPERTENSION 10/22/2006  . NASH (nonalcoholic steatohepatitis)   . Nocturnal hypoxemia 02/17/2013  . Obesity   . Obstructive sleep apnea   . OSTEOARTHRITIS 10/22/2006  . Other chronic nonalcoholic liver disease 7/42/5956    Past Surgical History:  Procedure Laterality Date  . BREAST SURGERY     Breast reduction  . DEXA  08/2005  . DILATION AND CURETTAGE OF UTERUS    . ELECTROCARDIOGRAM  10/15/2006  . ESOPHAGOGASTRODUODENOSCOPY  12/08/2005  . EYE SURGERY    . Stress Cardiolite  10/21/2005  . sweat gland removal    . WISDOM TOOTH EXTRACTION      Social History   Social History  . Marital status: Married    Spouse name: Heather [name]  . Number of children: 2  . Years of education: College   Occupational History  . Retired    Social History Main Topics  . Smoking status: Former Smoker    Packs/day: 2.00    Years: 20.00    Types:  Cigarettes    Quit date: 03/03/1979  . Smokeless tobacco: Never Used  . Alcohol use No  . Drug use: No  . Sexual activity: Yes    Birth control/ protection: Surgical   Other Topics Concern  . Not on file   Social History Narrative   Patient is married Heather [name]).   Patient has two children.   Patient is retired.   Patient has a college education.   Patient is right-handed.   Patient lives at home with family.   Caffeine Use: 2 soda every other day    Current Outpatient Prescriptions on File Prior to Visit  Medication Sig Dispense Refill  . acetaminophen (TYLENOL) 500 MG tablet Take 650 mg by mouth 3 (three) times daily as needed for pain.     . Calcium Carbonate-Vitamin D (CALCIUM 600 + D PO) Take 2 tablets by mouth daily.      . clopidogrel (PLAVIX) 75 MG tablet TAKE 1 TABLET EVERY DAY 90 tablet 3  . Ferrous Sulfate (IRON) 325 (65 FE) MG TABS Take 1 tablet by mouth daily.      . furosemide (LASIX) 40 MG tablet Take 1 tablet (40 mg total) by mouth daily. 90 tablet 3  . gabapentin (NEURONTIN) 100 MG capsule Take 2 capsules (200 mg total) by mouth at bedtime. 60 capsule 3  . glucosamine-chondroitin 500-400 MG tablet Take 1 tablet by  mouth 2 (two) times daily.      [name] losartan (COZAAR) 25 MG tablet TAKE 1 TABLET EVERY DAY 90 tablet 3  . metFORMIN (GLUCOPHAGE-XR) 500 MG 24 hr tablet TAKE 2 TABLETS TWICE DAILY 360 tablet 3  . modafinil (PROVIGIL) 200 MG tablet Take 1 tablet (200 mg total) by mouth daily. 30 tablet 1  . Multiple Vitamin (MULTIVITAMIN) tablet Take 1 tablet by mouth daily.      . naproxen sodium (ANAPROX) 220 MG tablet Take 220 mg by mouth as needed.    . NON FORMULARY Tumeric    . Nutritional Supplements (PEPTAMEN 1.5 PO) Take by mouth 2 (two) times daily.    [name] omeprazole (PRILOSEC) 20 MG capsule Take 20 mg by mouth daily as needed.     . potassium chloride SA (K-DUR,KLOR-CON) 20 MEQ tablet TAKE 1 TABLET EVERY DAY 90 tablet 3  . pseudoephedrine-acetaminophen (TYLENOL  SINUS) 30-500 MG TABS tablet Take 1 tablet by mouth as needed.    . rosuvastatin (CRESTOR) 40 MG tablet TAKE 1 TABLET EVERY DAY 90 tablet 3  . SF 5000 PLUS 1.1 % CREA dental cream   0  . SUMAtriptan (IMITREX) 20 MG/ACT nasal spray Place 1 spray (20 mg total) into the nose every 2 (two) hours as needed for migraine. 12 Inhaler 1  . topiramate (TOPAMAX) 100 MG tablet Take 1 tablet (100 mg total) by mouth daily. 90 tablet 3  . traMADol (ULTRAM) 50 MG tablet Take 1 tablet (50 mg total) by mouth every 12 (twelve) hours as needed. 30 tablet 0  . vitamin E 200 UNIT capsule Take 200 Units by mouth daily.     No current facility-administered medications on file prior to visit.     Allergies  Allergen Reactions  . Aspirin   . Coconut Oil   . Latex   . Lisinopril     REACTION: cough  . Metoprolol Itching  . Peanut-Containing Drug Products   . Penicillins     REACTION: rash  . Prednisone   . Strawberry Extract     Family History  Problem Relation Age of Onset  . Cancer Mother     Breast Cancer, Colon Cancer  . Asthma Mother   . Depression Mother   . Bipolar disorder Mother   . Dementia Mother   . Arthritis Mother   . Hypertension Father   . Migraines Father   . Cancer Maternal Aunt   . Heart disease Maternal Grandmother   . Cancer Maternal Grandfather     BP 122/80   Pulse 97   Ht 5' 4.5" (1.638 m)   Wt 264 lb (119.7 kg)   SpO2 97%   BMI 44.62 kg/m   Review of Systems No change in chronic leg edema.  Denies sob.     Objective:   Physical Exam VITAL SIGNS:  See vs page.  GENERAL: no distress.  Head: no temporal tenderness.  Neck: small multinodular goiter.   LUNGS:  Clear to auscultation.   HEART:  Regular rate and rhythm without murmurs noted. Normal S1,S2.   Pulses: dorsalis pedis intact bilat.   MSK: no deformity of the feet.  CV: 1+ bilat leg edema.   Skin:  no ulcer on the feet, but the skin is dry,and there are heavy calluses.  normal color and temp on the  feet.  Neuro: sensation is intact to touch on the feet.   I personally reviewed electrocardiogram tracing (today):  Indication: DM. Impression: normal.  Lab Results  Component Value Date   CREATININE 0.87 03/05/2016   BUN 15 03/05/2016   NA 140 03/05/2016   K 3.5 03/05/2016   CL 102 03/05/2016   CO2 33 (H) 03/05/2016   Lab Results  Component Value Date   ESRSEDRATE 130 (H) 03/05/2016   Lab Results  Component Value Date   HGBA1C 6.4 03/05/2016      Assessment & Plan:  Type 2 DM: well-controlled.  Please continue the same metformin Headache with very high ESR, new: no visual loss or temporal tenderness, so I'll hold off on steroid rx for now.  ref rheumatol. Hypokalemia: well-replaced.  Same rx.    Subjective:   Patient here for Medicare annual wellness visit and management of other chronic and acute problems.     Risk factors: advanced age    57 of Physicians Providing Medical Care to Patient:  See "snapshot"   Activities of Daily Living: In your present state of health, do you have any difficulty performing the following activities?:  Preparing food and eating?: No  Bathing yourself: No  Getting dressed: No  Using the toilet:No  Moving around from place to place: No  In the past year have you fallen or had a near fall?: No    Home Safety: Has smoke detector and wears seat belts. No firearms.    Diet and Exercise  Current exercise habits: pt says good.   Dietary issues discussed: pt says not good.    Depression Screen  Q1: Over the past two weeks, have you felt down, depressed or hopeless? no  Q2: Over the past two weeks, have you felt little interest or pleasure in doing things? no   The following portions of the patient's history were reviewed and updated as appropriate: allergies, current medications, past family history, past medical history, past social history, past surgical history and problem list.   Review of Systems  Denies hearing loss, and  visual loss.  Objective:   Vision:  Sees optometrist, so she declines [facility] today Hearing: grossly normal Body mass index:  See vs page Msk: pt slowly performs "get-up-and-go" from a sitting position.  Cognitive Impairment Assessment: cognition, memory and judgment appear normal.  remembers 3/3 at 5 minutes.  excellent recall.  can easily read and write a sentence.  alert and oriented x 3.    Assessment:   Medicare wellness utd on preventive parameters    Plan:   During the course of the visit the patient was educated and counseled about appropriate screening and preventive services including:        Fall prevention   Screening mammography  Bone densitometry screening  Diabetes screening  Nutrition counseling   Vaccines / LABS Zostavax / Pneumococcal Vaccine  today   Patient Instructions (the written plan) was given to the patient.  Patient is advised the following: Patient Instructions  blood tests are requested for you today.  We'll let you know about the results. Please consider these measures for your health:  minimize alcohol.  Do not use tobacco products.  Have a colonoscopy at least every 10 years from age 27.  Women should have an annual mammogram from age 72.  Keep firearms safely stored.  Always use seat belts.  have working smoke alarms in your home.  See an eye doctor and dentist regularly.  Never drive under the influence of alcohol or drugs (including prescription drugs).   It is critically important to prevent falling down (keep floor areas well-lit, dry, and free of  loose objects.  If you have a cane, walker, or wheelchair, you should use it, even for short trips around the house.  Wear flat-soled shoes.  Also, try not to rush).   good diet and exercise significantly improve the control of your diabetes.  please let me know if you wish to be referred to a dietician.  high blood sugar is very risky to your health.  you should see an eye doctor and dentist every year.  It is  very important to get all recommended vaccinations.   Please come back for a follow-up appointment in 6 months.

## 2016-03-05 NOTE — Patient Instructions (Addendum)
blood tests are requested for you today.  We'll let you know about the results. Please consider these measures for your health:  minimize alcohol.  Do not use tobacco products.  Have a colonoscopy at least every 10 years from age 70.  Women should have an annual mammogram from age 97.  Keep firearms safely stored.  Always use seat belts.  have working smoke alarms in your home.  See an eye doctor and dentist regularly.  Never drive under the influence of alcohol or drugs (including prescription drugs).   It is critically important to prevent falling down (keep floor areas well-lit, dry, and free of loose objects.  If you have a cane, walker, or wheelchair, you should use it, even for short trips around the house.  Wear flat-soled shoes.  Also, try not to rush).   good diet and exercise significantly improve the control of your diabetes.  please let me know if you wish to be referred to a dietician.  high blood sugar is very risky to your health.  you should see an eye doctor and dentist every year.  It is very important to get all recommended vaccinations.   Please come back for a follow-up appointment in 6 months.

## 2016-03-10 ENCOUNTER — Other Ambulatory Visit: Payer: Self-pay | Admitting: Endocrinology

## 2016-03-23 ENCOUNTER — Ambulatory Visit
Admission: RE | Admit: 2016-03-23 | Discharge: 2016-03-23 | Disposition: A | Discharge status: Home / Self Care | Payer: Medicare Other | Source: Ambulatory Visit | Attending: Endocrinology | Admitting: Endocrinology

## 2016-03-23 DIAGNOSIS — E042 Nontoxic multinodular goiter: Secondary | ICD-10-CM | POA: Diagnosis not present

## 2016-03-25 DIAGNOSIS — Z1211 Encounter for screening for malignant neoplasm of colon: Secondary | ICD-10-CM | POA: Diagnosis not present

## 2016-03-25 DIAGNOSIS — Z1212 Encounter for screening for malignant neoplasm of rectum: Secondary | ICD-10-CM | POA: Diagnosis not present

## 2016-03-26 DIAGNOSIS — M25569 Pain in unspecified knee: Secondary | ICD-10-CM | POA: Diagnosis not present

## 2016-03-26 DIAGNOSIS — R51 Headache: Secondary | ICD-10-CM | POA: Diagnosis not present

## 2016-03-26 DIAGNOSIS — R700 Elevated erythrocyte sedimentation rate: Secondary | ICD-10-CM | POA: Diagnosis not present

## 2016-03-26 DIAGNOSIS — R531 Weakness: Secondary | ICD-10-CM | POA: Diagnosis not present

## 2016-03-27 LAB — COLOGUARD

## 2016-04-01 ENCOUNTER — Encounter: Payer: Self-pay | Admitting: Family Medicine

## 2016-04-02 ENCOUNTER — Telehealth: Payer: Self-pay | Admitting: Endocrinology

## 2016-04-02 DIAGNOSIS — Z1211 Encounter for screening for malignant neoplasm of colon: Secondary | ICD-10-CM | POA: Insufficient documentation

## 2016-04-02 NOTE — Telephone Encounter (Signed)
Requested a call back from the patient to discuss further.

## 2016-04-02 NOTE — Telephone Encounter (Signed)
please call patient: cologuard is pos.  This does not mean you have cancer--just that you should see a specialist.  you will receive a phone call, about a day and time for an appointment

## 2016-04-03 ENCOUNTER — Encounter: Payer: Self-pay | Admitting: Gastroenterology

## 2016-04-03 NOTE — Telephone Encounter (Signed)
I contacted the patient and advised of message. She voiced understanding and had no further questions at this time.

## 2016-04-09 DIAGNOSIS — R51 Headache: Secondary | ICD-10-CM | POA: Diagnosis not present

## 2016-04-09 DIAGNOSIS — R531 Weakness: Secondary | ICD-10-CM | POA: Diagnosis not present

## 2016-04-09 DIAGNOSIS — R700 Elevated erythrocyte sedimentation rate: Secondary | ICD-10-CM | POA: Diagnosis not present

## 2016-04-09 DIAGNOSIS — M25569 Pain in unspecified knee: Secondary | ICD-10-CM | POA: Diagnosis not present

## 2016-04-10 ENCOUNTER — Encounter: Payer: Self-pay | Admitting: Family Medicine

## 2016-04-10 ENCOUNTER — Ambulatory Visit (INDEPENDENT_AMBULATORY_CARE_PROVIDER_SITE_OTHER): Payer: Medicare Other | Admitting: Family Medicine

## 2016-04-10 DIAGNOSIS — M170 Bilateral primary osteoarthritis of knee: Secondary | ICD-10-CM | POA: Diagnosis not present

## 2016-04-10 NOTE — Progress Notes (Signed)
[name] [address] [place], [place] [address] Phone: [phone] Subjective:    I'm seeing this patient by the request  of:  [name], MD   CC: Bilateral knee pain follow-up  NTI:[id number]  Heather [name] is a 71 y.o. female coming in with complaint of right Knee pain. Patient was seen 7 months ago and was given a Monovisc injection. Patient states  Unfortunately the pain has come back. Patient states that it still better. Having worsening pain in assessment. Still has some instability. Feels that the viscous supplementation only helped for one month. Patient saw a physician about replacement but knows she needs to lose weight before surgical intervention  Patient saw me back in December didn't get bilateral steroid injections. Patient states that she is doing better overall. States that she is not having as severe amount of pain. Still has some instability. Patient has been trying to wear over-the-counter braces but states that they do not seem to fit. States though that she still has pain on daily basis. Still concerned about walking long distances secondary to the instability of the knee  X-rays have confirmed severe osteoarthritic changes.   Past Medical History:  Diagnosis Date  . Abnormal Pap smear   . ALLERGIC RHINITIS 10/22/2006  . ANEMIA 12/18/2008  . ASYMPTOMATIC POSTMENOPAUSAL STATUS 11/22/2007  . Complication of anesthesia   . Degenerative arthritis   . DIABETES MELLITUS, TYPE II 01/13/2007  . Dyslipidemia   . Fibroid   . GERD 07/20/2007  . GOITER, MULTINODULAR 07/20/2007  . Headache(784.0) 07/20/2007  . HEARING LOSS 11/22/2007  . Hx of colposcopy with cervical biopsy   . HYPERCHOLESTEROLEMIA 01/13/2007  . Hyperglycemia   . HYPERTENSION 10/22/2006  . NASH (nonalcoholic steatohepatitis)   . Nocturnal hypoxemia 02/17/2013  . Obesity   . Obstructive sleep apnea   . OSTEOARTHRITIS 10/22/2006  . Other chronic nonalcoholic liver  disease 6/76/1950   Past Surgical History:  Procedure Laterality Date  . BREAST SURGERY     Breast reduction  . DEXA  08/2005  . DILATION AND CURETTAGE OF UTERUS    . ELECTROCARDIOGRAM  10/15/2006  . ESOPHAGOGASTRODUODENOSCOPY  12/08/2005  . EYE SURGERY    . Stress Cardiolite  10/21/2005  . sweat gland removal    . WISDOM TOOTH EXTRACTION     Social History  Substance Use Topics  . Smoking status: Former Smoker    Packs/day: 2.00    Years: 20.00    Types: Cigarettes    Quit date: 03/03/1979  . Smokeless tobacco: Never Used  . Alcohol use No   Allergies  Allergen Reactions  . Aspirin   . Coconut Oil   . Latex   . Lisinopril     REACTION: cough  . Metoprolol Itching  . Peanut-Containing Drug Products   . Penicillins     REACTION: rash  . Prednisone   . Strawberry Extract    Family History  Problem Relation Age of Onset  . Cancer Mother     Breast Cancer, Colon Cancer  . Asthma Mother   . Depression Mother   . Bipolar disorder Mother   . Dementia Mother   . Arthritis Mother   . Hypertension Father   . Migraines Father   . Cancer Maternal Aunt   . Heart disease Maternal Grandmother   . Cancer Maternal Grandfather     Past medical history, social, surgical and family history all reviewed in electronic medical record.   Review of  Systems: No headache, visual changes, nausea, vomiting, diarrhea, constipation, dizziness, abdominal pain, skin rash, fevers, chills, night sweats, weight loss, swollen lymph nodes, body aches, joint swelling, muscle aches, chest pain, shortness of breath, mood changes.    Objective   Blood pressure 118/72, pulse 88, height 5' 4.5" (1.638 m), weight 267 lb (121.1 kg).    Systems examined below as of 04/10/16 General: NAD A&O x3 mood, affect normal  HEENT: Pupils equal, extraocular movements intact no nystagmus Respiratory: not short of breath at rest or with speaking Cardiovascular:trace edema Skin: Warm dry intact with no signs of  infection or rash on extremities or on axial skeleton. Abdomen: Soft nontender, no masses Neuro: Cranial nerves  intact, neurovascularly intact in all extremities with 2+ DTRs and 2+ pulses. Lymph: No lymphadenopathy appreciated today  Gait continued antalgic gait..  MSK: Non tender with full range of motion and good stability and symmetric strength and tone of shoulders, elbows, wrist,  hips and ankles bilaterally.  Arthritic changes of multiple joints Knee: Bilateral Difficult to assess secondary to patient's habitus Continued tenderness over the medial joint line and some diffuse tenderness  Does have crepitus with range of motion And is now lacking the last 5 of flexion and extension. Mild instability noted of the knees bilaterally with valgus force, more on the right  mild painful patellar compression. Patellar glide moderate crepitus with significant more on the right side. Patellar and quadriceps tendons unremarkable. Hamstring and quadriceps strength is normal.   .   Impression and Recommendations:     This case required medical decision making of moderate complexity.

## 2016-04-10 NOTE — Patient Instructions (Signed)
God to see you  I see the lab.,  I think the EMG is a good idea I would have you read about Polymyalgia rheumatica.  Lets get a brace for you matt or ryan will be calling you  Ice is your friend Turmeric 519m twice daily  Make an appointment with me in 2 weeks if knee is worse and need injection.

## 2016-04-11 NOTE — Assessment & Plan Note (Addendum)
Discussed with patient again at great length. We discussed the possibility of restarting viscous supplementation versus possibly custom bracing. Patient elected do the custom bracing. Patient does need custom second right large thigh to calf ratio and instability of the knee. We also discussed that we could potentially repeat another steroid injection. Patient declined any type of injection today. Patient would like to see with custom bracing is a possibility. Discussed icing regimen. Discussed which activities to do in which ones to avoid. Patient declined formal physical therapy. Patient will try to stay active otherwise. Follow-up with me again in 6-12 weeks for further evaluation and treatment.  Spent  25 minutes with patient face-to-face and had greater than 50% of counseling including as described above in assessment and plan.

## 2016-04-13 ENCOUNTER — Other Ambulatory Visit: Payer: Self-pay | Admitting: Endocrinology

## 2016-04-21 ENCOUNTER — Ambulatory Visit (HOSPITAL_COMMUNITY)
Admission: RE | Admit: 2016-04-21 | Discharge: 2016-04-21 | Disposition: A | Discharge status: Home / Self Care | Payer: Medicare Other | Source: Ambulatory Visit | Attending: Cardiology | Admitting: Cardiology

## 2016-04-21 ENCOUNTER — Telehealth (HOSPITAL_COMMUNITY): Payer: Self-pay | Admitting: Vascular Surgery

## 2016-04-21 ENCOUNTER — Encounter: Payer: Self-pay | Admitting: Family Medicine

## 2016-04-21 ENCOUNTER — Encounter (HOSPITAL_COMMUNITY): Payer: Self-pay

## 2016-04-21 ENCOUNTER — Telehealth (HOSPITAL_COMMUNITY): Payer: Self-pay | Admitting: Cardiology

## 2016-04-21 VITALS — BP 126/59 | HR 88 | Wt 268.0 lb

## 2016-04-21 DIAGNOSIS — R600 Localized edema: Secondary | ICD-10-CM | POA: Diagnosis not present

## 2016-04-21 DIAGNOSIS — Z79899 Other long term (current) drug therapy: Secondary | ICD-10-CM | POA: Insufficient documentation

## 2016-04-21 DIAGNOSIS — I10 Essential (primary) hypertension: Secondary | ICD-10-CM | POA: Diagnosis not present

## 2016-04-21 DIAGNOSIS — I2510 Atherosclerotic heart disease of native coronary artery without angina pectoris: Secondary | ICD-10-CM | POA: Diagnosis not present

## 2016-04-21 DIAGNOSIS — Z7984 Long term (current) use of oral hypoglycemic drugs: Secondary | ICD-10-CM | POA: Insufficient documentation

## 2016-04-21 DIAGNOSIS — R609 Edema, unspecified: Secondary | ICD-10-CM | POA: Diagnosis not present

## 2016-04-21 DIAGNOSIS — E669 Obesity, unspecified: Secondary | ICD-10-CM | POA: Diagnosis not present

## 2016-04-21 DIAGNOSIS — Z5189 Encounter for other specified aftercare: Secondary | ICD-10-CM | POA: Diagnosis not present

## 2016-04-21 DIAGNOSIS — R0602 Shortness of breath: Secondary | ICD-10-CM | POA: Diagnosis not present

## 2016-04-21 DIAGNOSIS — E785 Hyperlipidemia, unspecified: Secondary | ICD-10-CM | POA: Diagnosis not present

## 2016-04-21 DIAGNOSIS — Z87891 Personal history of nicotine dependence: Secondary | ICD-10-CM | POA: Insufficient documentation

## 2016-04-21 NOTE — Telephone Encounter (Signed)
Sent message to [employer] to schedule echo

## 2016-04-21 NOTE — Patient Instructions (Signed)
Will schedule you for an echocardiogram at [facility]. Address: [address] ([place]), [place], [place] [address]  Phone: [phone]  Follow up 1 year with Dr. Aundra Dubin.  Do the following things EVERYDAY: 1) Weigh yourself in the morning before breakfast. Write it down and keep it in a log. 2) Take your medicines as prescribed 3) Eat low salt foods-Limit salt (sodium) to 2000 mg per day.  4) Stay as active as you can everyday 5) Limit all fluids for the day to less than 2 liters

## 2016-04-22 NOTE — Telephone Encounter (Signed)
  04/21/2016 12:55 PM Phone ([place]) Heather [name], Heather [name] (Self) [phone] (H)   Left Message - Called pt and lmsg for her to CB to schedule test    By Verdene Rio

## 2016-04-22 NOTE — Progress Notes (Signed)
Patient ID: Heather [name], female   DOB: 03-24-1945, 71 y.o.   MRN: [id number] PCP: Dr. Loanne [name] Cardiology: Dr. Aundra [name]  71 yo with CAD by coronary calcium on CT chest, OSA on Bipap, type II diabetes, and HTN presents for cardiology evaluation.  Her husband Heather [name]) is a patient of mine as well.  She had a workup for exertional dyspnea in 6/14 by Dr Heather [name].  PFTs were restrictive with normal DLCO, suggesting abnormality was from body habitus.  CT chest showed coronary calcification.  She had an echo showed normal EF and a Lexiscan Cardiolite in 8/14 that was normal.    Recently, she has not had significant exertional dyspnea.  She is more limited by knee pain than anything else.  Her weight is down 8 lbs since last appointment but she is morbidly obese.   No chest pain. No orthopnea/PND.  Some generalized fatigue.  She has developed significant peripheral edema. She cannot take ASA due to hives so is taking Plavix.   ECG: NSR, normal  Labs (12/15): LDL 64, HDL 50 Labs (6/16): TSH normal, HCT 39.1, K 3.4, creatinine 0.91 Labs (1/18): K 3.5, creatinine 0.87, LDL 78, HDL 65  PMH: 1. Obese 2. OSA: On oxygen and Bipap at night.  3. Migraines 4. GERD with large hiatal hernia 5. Type II diabetes. 6. PFTs in 6/14 with evidence for restriction but normal DLCO.  Suspect related to body habitus.  7. CAD: Coronary calcium on CT chest in 6/14.  Lexiscan Cardiolite in 8/14 with EF 51%, no ischemia or infarction.  Echo (8/14) with EF 60%, mild LVH.  8. Hyperlipidemia 9. HTN 10. Uterine fibroids 11. Osteoarthritis 12. Aspirin allergy: Hives.   SH: Married, quit smoking in 1980s, 2 children  FH: No premature CAD.  ROS: All systems reviewed and negative except as per HPI.   Current Outpatient Prescriptions  Medication Sig Dispense Refill  . Calcium Carbonate-Vitamin D (CALCIUM 600 + D PO) Take 2 tablets by mouth daily.      . clopidogrel (PLAVIX) 75 MG tablet TAKE 1 TABLET EVERY DAY  90 tablet 3  . Ferrous Sulfate (IRON) 325 (65 FE) MG TABS Take 1 tablet by mouth daily.      . furosemide (LASIX) 40 MG tablet Take 1 tablet (40 mg total) by mouth daily. 90 tablet 3  . gabapentin (NEURONTIN) 100 MG capsule Take 2 capsules (200 mg total) by mouth at bedtime. 60 capsule 3  . glucosamine-chondroitin 500-400 MG tablet Take 1 tablet by mouth 2 (two) times daily.      [name] losartan (COZAAR) 25 MG tablet TAKE 1 TABLET EVERY DAY 90 tablet 3  . metFORMIN (GLUCOPHAGE-XR) 500 MG 24 hr tablet TAKE 2 TABLETS TWICE DAILY 360 tablet 3  . modafinil (PROVIGIL) 200 MG tablet Take 1 tablet (200 mg total) by mouth daily. 30 tablet 1  . Multiple Vitamin (MULTIVITAMIN) tablet Take 1 tablet by mouth daily.      . NON FORMULARY Tumeric    . Nutritional Supplements (PEPTAMEN 1.5 PO) Take by mouth 2 (two) times daily.    [name] omeprazole (PRILOSEC) 20 MG capsule Take 20 mg by mouth daily as needed.     . potassium chloride SA (K-DUR,KLOR-CON) 20 MEQ tablet TAKE 1 TABLET EVERY DAY 90 tablet 3  . rosuvastatin (CRESTOR) 40 MG tablet TAKE 1 TABLET EVERY DAY 90 tablet 3  . SF 5000 PLUS 1.1 % CREA dental cream   0  . SUMAtriptan (IMITREX) 20 MG/ACT nasal  spray Place 1 spray (20 mg total) into the nose every 2 (two) hours as needed for migraine. 12 Inhaler 1  . topiramate (TOPAMAX) 100 MG tablet Take 1 tablet (100 mg total) by mouth daily. 90 tablet 3  . traMADol (ULTRAM) 50 MG tablet Take 1 tablet (50 mg total) by mouth every 12 (twelve) hours as needed. 30 tablet 0  . vitamin E 200 UNIT capsule Take 200 Units by mouth daily.    [name] acetaminophen (TYLENOL) 500 MG tablet Take 650 mg by mouth 3 (three) times daily as needed for pain.     [name] ALLEGRA-D ALLERGY & CONGESTION 180-240 MG 24 hr tablet TAKE 1 TABLET BY MOUTH DAILY 30 tablet 0  . naproxen sodium (ANAPROX) 220 MG tablet Take 220 mg by mouth as needed.    . pseudoephedrine-acetaminophen (TYLENOL SINUS) 30-500 MG TABS tablet Take 1 tablet by mouth as needed.      No current facility-administered medications for this encounter.    BP (!) 126/59   Pulse 88   Wt 268 lb (121.6 kg)   SpO2 99%   BMI 45.29 kg/m  General: NAD, obese Neck: No JVD, no thyromegaly or thyroid nodule.  Lungs: Clear to auscultation bilaterally with normal respiratory effort. CV: Nondisplaced PMI.  Heart regular S1/S2, no S3/S4, no murmur.  2+ ankle edema.  No carotid bruit.  Normal pedal pulses.  Abdomen: Soft, nontender, no hepatosplenomegaly, no distention.  Skin: Intact without lesions or rashes.  Neurologic: Alert and oriented x 3.  Psych: Normal affect. Extremities: No clubbing or cyanosis.  HEENT: Normal.   Assessment/Plan: 1. CAD: Patient has coronary disease based on calcium seen on chest CT in the coronaries in 6/14.  She had a Lexiscan Cardiolite in 8/14 that was normal.  Currently, she is really not limited by dyspnea and has no chest pain.  Main problem is bilateral knee pain with ambulation.   - Continue Plavix 75 daily (ASA allergy).  - Continue statin and ARB.  2. Hyperlipidemia: Good lipids in 1/18 on Crestor.  3. HTN: BP is controlled.  4. Obesity: Needs to work on weight loss.  I would like her to go back to pool walking (was doing this in the past).  5. Peripheral edema: Somewhat worse than in the past.  She is on Lasix 40 mg daily.  I will arrange for an echo to assess LV and RV function.   Heather [name] 04/22/2016

## 2016-04-23 ENCOUNTER — Ambulatory Visit: Payer: Medicare Other | Admitting: Family Medicine

## 2016-04-27 DIAGNOSIS — Z1231 Encounter for screening mammogram for malignant neoplasm of breast: Secondary | ICD-10-CM | POA: Diagnosis not present

## 2016-04-27 NOTE — Progress Notes (Signed)
[name] [address], [place] [address] Phone: [phone] Subjective:    I'm seeing this patient by the request  of:  [name], MD   CC: Bilateral knee pain follow-up  Heather [name]  Heather [name] is a 71 y.o. female coming in with complaint of right Knee pain. Patient was seen 7 months ago and was given a Monovisc injection. Patient states  Unfortunately the pain has come back. Patient once to avoid any type of surgical intervention at this time. Feels though that unfortunately the pain is severely worsen again. Patient is having difficult time even doing daily activities such as going up one flight of stairs. Worsening symptoms of previous symptoms. Increasing instability.   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   Past Medical History:  Diagnosis Date  . Abnormal Pap smear   . ALLERGIC RHINITIS 10/22/2006  . ANEMIA 12/18/2008  . ASYMPTOMATIC POSTMENOPAUSAL STATUS 11/22/2007  . Complication of anesthesia   . Degenerative arthritis   . DIABETES MELLITUS, TYPE II 01/13/2007  . Dyslipidemia   . Fibroid   . GERD 07/20/2007  . GOITER, MULTINODULAR 07/20/2007  . Headache(784.0) 07/20/2007  . HEARING LOSS 11/22/2007  . Hx of colposcopy with cervical biopsy   . HYPERCHOLESTEROLEMIA 01/13/2007  . Hyperglycemia   . HYPERTENSION 10/22/2006  . NASH (nonalcoholic steatohepatitis)   . Nocturnal hypoxemia 02/17/2013  . Obesity   . Obstructive sleep apnea   . OSTEOARTHRITIS 10/22/2006  . Other chronic nonalcoholic liver disease 5/85/2778   Past Surgical History:  Procedure Laterality Date  . BREAST SURGERY     Breast reduction  . DEXA  08/2005  . DILATION AND CURETTAGE OF UTERUS    . ELECTROCARDIOGRAM  10/15/2006  . ESOPHAGOGASTRODUODENOSCOPY  12/08/2005  . EYE SURGERY    . Stress Cardiolite  10/21/2005  . sweat gland removal    . WISDOM TOOTH EXTRACTION     Social History  Substance Use Topics   . Smoking status: Former Smoker    Packs/day: 2.00    Years: 20.00    Types: Cigarettes    Quit date: 03/03/1979  . Smokeless tobacco: Never Used  . Alcohol use No   Allergies  Allergen Reactions  . Aspirin   . Coconut Oil   . Latex   . Lisinopril     REACTION: cough  . Metoprolol Itching  . Peanut-Containing Drug Products   . Penicillins     REACTION: rash  . Prednisone   . Strawberry Extract    Family History  Problem Relation Age of Onset  . Cancer Mother     Breast Cancer, Colon Cancer  . Asthma Mother   . Depression Mother   . Bipolar disorder Mother   . Dementia Mother   . Arthritis Mother   . Hypertension Father   . Migraines Father   . Cancer Maternal Aunt   . Heart disease Maternal Grandmother   . Cancer Maternal Grandfather     Past medical history, social, surgical and family history all reviewed in electronic medical record.   Review of Systems: No headache, visual changes, nausea, vomiting, diarrhea, constipation, dizziness, abdominal pain, skin rash, fevers, chills, night sweats, weight loss, swollen lymph nodes, body aches, joint swelling, muscle aches, chest pain, shortness of breath, mood changes.    Objective   Blood pressure 122/82, pulse 86, height 5' 4.5" (1.638 m), weight 268 lb (121.6 kg).    Systems examined below as of  04/28/16 General: NAD A&O x3 mood, affect normal  HEENT: Pupils equal, extraocular movements intact no nystagmus Respiratory: not short of breath at rest or with speaking Cardiovascular: No lower extremity edema, non tender Skin: Warm dry intact with no signs of infection or rash on extremities or on axial skeleton. Abdomen: Soft nontender, no masses Neuro: Cranial nerves  intact, neurovascularly intact in all extremities with 2+ DTRs and 2+ pulses. Lymph: No lymphadenopathy appreciated today  Gait severely antalgic gait..  MSK: Non tender with full range of motion and good stability and symmetric strength and tone of  shoulders, elbows, wrist,  hips and ankles bilaterally.  Arthritic changes of multiple joints Knee: Bilateral valgus deformity noted. Large thigh to calf ratio.  Tender to palpation over medial and PF joint line.  ROM full in flexion and extension and lower leg rotation. instability with valgus force.  painful patellar compression. Patellar glide with moderate crepitus. Patellar and quadriceps tendons unremarkable. Hamstring and quadriceps strength is normal. Contralateral knee shows   .After informed written and verbal consent, patient was seated on exam table. Right knee was prepped with alcohol swab and utilizing anterolateral approach, patient's right knee space was injected with 4:1  marcaine 0.5%: Kenalog 75m/dL. Patient tolerated the procedure well without immediate complications.  After informed written and verbal consent, patient was seated on exam table. Left knee was prepped with alcohol swab and utilizing anterolateral approach, patient's left knee space was injected with 4:1  marcaine 0.5%: Kenalog 454mdL. Patient tolerated the procedure well without immediate complications.   Impression and Recommendations:     This case required medical decision making of moderate complexity.

## 2016-04-28 ENCOUNTER — Ambulatory Visit (INDEPENDENT_AMBULATORY_CARE_PROVIDER_SITE_OTHER): Payer: Medicare Other | Admitting: Family Medicine

## 2016-04-28 ENCOUNTER — Encounter: Payer: Self-pay | Admitting: Family Medicine

## 2016-04-28 DIAGNOSIS — I2510 Atherosclerotic heart disease of native coronary artery without angina pectoris: Secondary | ICD-10-CM | POA: Diagnosis not present

## 2016-04-28 DIAGNOSIS — M170 Bilateral primary osteoarthritis of knee: Secondary | ICD-10-CM | POA: Diagnosis not present

## 2016-04-28 NOTE — Assessment & Plan Note (Signed)
Worsening symptoms again. With patient's other comorbidities Synvisc finger potentially intra-articular injections. Bilateral injections given at 10 today. We discussed the possibility of viscous supplementation. Patient will consider it in the future. We discussed icing regimen. Discussed home exercises. Discussed which activities to do a which was potentially avoid. Patient encouraged to potentially lose weight. Patient likely will need a knee replacement at some point. Follow-up again in 4-6 weeks.

## 2016-04-28 NOTE — Patient Instructions (Signed)
Good to see you  Alvera Singh is your friend.  We injected both knees.  Try to find time for yourself.  See me again in 3-4 weeks if we want to do the other injections.

## 2016-04-29 ENCOUNTER — Telehealth: Payer: Self-pay | Admitting: *Deleted

## 2016-04-30 ENCOUNTER — Encounter: Payer: Self-pay | Admitting: *Deleted

## 2016-04-30 NOTE — Telephone Encounter (Signed)
LMOM to call office about Plavix- will need OV if she does take Plavix still

## 2016-05-05 NOTE — Telephone Encounter (Signed)
OV made with P Guenter on 05-12-16

## 2016-05-08 ENCOUNTER — Other Ambulatory Visit: Payer: Self-pay

## 2016-05-08 ENCOUNTER — Ambulatory Visit (HOSPITAL_COMMUNITY): Payer: Medicare Other | Attending: Internal Medicine

## 2016-05-08 DIAGNOSIS — I071 Rheumatic tricuspid insufficiency: Secondary | ICD-10-CM | POA: Diagnosis not present

## 2016-05-08 DIAGNOSIS — I110 Hypertensive heart disease with heart failure: Secondary | ICD-10-CM | POA: Diagnosis not present

## 2016-05-08 DIAGNOSIS — R0602 Shortness of breath: Secondary | ICD-10-CM | POA: Diagnosis not present

## 2016-05-08 DIAGNOSIS — Z6841 Body Mass Index (BMI) 40.0 and over, adult: Secondary | ICD-10-CM | POA: Insufficient documentation

## 2016-05-08 DIAGNOSIS — E785 Hyperlipidemia, unspecified: Secondary | ICD-10-CM | POA: Insufficient documentation

## 2016-05-08 DIAGNOSIS — I509 Heart failure, unspecified: Secondary | ICD-10-CM | POA: Insufficient documentation

## 2016-05-08 DIAGNOSIS — Z87891 Personal history of nicotine dependence: Secondary | ICD-10-CM | POA: Insufficient documentation

## 2016-05-08 DIAGNOSIS — E669 Obesity, unspecified: Secondary | ICD-10-CM | POA: Diagnosis not present

## 2016-05-08 DIAGNOSIS — E119 Type 2 diabetes mellitus without complications: Secondary | ICD-10-CM | POA: Insufficient documentation

## 2016-05-12 ENCOUNTER — Other Ambulatory Visit: Payer: Self-pay | Admitting: Endocrinology

## 2016-05-12 ENCOUNTER — Ambulatory Visit (INDEPENDENT_AMBULATORY_CARE_PROVIDER_SITE_OTHER): Payer: Medicare Other | Admitting: Nurse Practitioner

## 2016-05-12 ENCOUNTER — Encounter: Payer: Self-pay | Admitting: Nurse Practitioner

## 2016-05-12 ENCOUNTER — Telehealth: Payer: Self-pay | Admitting: *Deleted

## 2016-05-12 VITALS — BP 100/66 | HR 84 | Ht 64.5 in | Wt 265.4 lb

## 2016-05-12 DIAGNOSIS — Z800 Family history of malignant neoplasm of digestive organs: Secondary | ICD-10-CM | POA: Diagnosis not present

## 2016-05-12 DIAGNOSIS — I2510 Atherosclerotic heart disease of native coronary artery without angina pectoris: Secondary | ICD-10-CM

## 2016-05-12 DIAGNOSIS — Z7901 Long term (current) use of anticoagulants: Secondary | ICD-10-CM | POA: Diagnosis not present

## 2016-05-12 MED ORDER — NA SULFATE-K SULFATE-MG SULF 17.5-3.13-1.6 GM/177ML PO SOLN: ORAL | 0 refills | Status: DC

## 2016-05-12 NOTE — Telephone Encounter (Signed)
  05/12/2016   RE: Heather [name] DOB: 01-04-1946 MRN: [id number]   Dear  Luisa Hart,    We have scheduled the above patient for an endoscopic procedure. Our records show that she is on anticoagulation therapy.   Please advise as to how long the patient may come off her therapy of Plavix prior to the procedure, which is scheduled for 05/20/2016.  Please fax back/ or route the completed form to [facility] at [phone].   Sincerely,    Genella Mech ,CMA AAMA

## 2016-05-12 NOTE — Patient Instructions (Signed)
You have been scheduled for a colonoscopy. Please follow written instructions given to you at your visit today.  Please pick up your prep supplies at the pharmacy within the next 1-3 days. If you use inhalers (even only as needed), please bring them with you on the day of your procedure. Your physician has requested that you go to www.startemmi.com and enter the access code given to you at your visit today. This web site gives a general overview about your procedure. However, you should still follow specific instructions given to you by our office regarding your preparation for the procedure.  You will be contacted by our office prior to your procedure for directions on holding your Plavix.  If you do not hear from our office 1 week prior to your scheduled procedure, please call [phone] to discuss.

## 2016-05-12 NOTE — Progress Notes (Signed)
HPI: Patient is a 71 year old female remotely known to [facility]. Her mother had colon cancer in her 35's. Patient had a colonoscopy in 2007 with findings of diverticulosis / very redundant colon. Her medical history is pertinent for coronary artery disease, OSA on cpap, hypertension, diastolic heart failure,  DM and obesity. Recent echo revealed EF of 60-70% and grade I diastolic failure. She is followed by Dr. Marigene Ehlers, maintained on plavix because she has an allergy to ASA. Patient here for colon cancer screening. She has no GI complaints. Specifically, no bowel changes or blood in stool. Labs are really January revealed normal white count, normal hemoglobin, normal renal function. ESR markedly elevated. Just seen by Otolaryngology for possible temporal artery biopsy but wasn't done as likelihood of positive result felt to be low. who didn't proceed with temporal artery biopsy.     Past Medical History:  Diagnosis Date  . Abnormal Pap smear   . ALLERGIC RHINITIS 10/22/2006  . ANEMIA 12/18/2008  . ASYMPTOMATIC POSTMENOPAUSAL STATUS 11/22/2007  . Complication of anesthesia   . Degenerative arthritis   . DIABETES MELLITUS, TYPE II 01/13/2007  . Dyslipidemia   . Fibroid   . GERD 07/20/2007  . GOITER, MULTINODULAR 07/20/2007  . Headache(784.0) 07/20/2007  . HEARING LOSS 11/22/2007  . Hx of colposcopy with cervical biopsy   . HYPERCHOLESTEROLEMIA 01/13/2007  . Hyperglycemia   . HYPERTENSION 10/22/2006  . NASH (nonalcoholic steatohepatitis)   . Nocturnal hypoxemia 02/17/2013  . Obesity   . Obstructive sleep apnea   . OSTEOARTHRITIS 10/22/2006  . Other chronic nonalcoholic liver disease 3/66/2947    Patient's surgical history, family medical history, social history, medications and allergies were all reviewed in Epic    Physical Exam: BP 100/66   Pulse 84   Ht 5' 4.5" (1.638 m)   Wt 265 lb 6.4 oz (120.4 kg)   BMI 44.85 kg/m   GENERAL: obese black female in NAD PSYCH:  :Pleasant, cooperative, normal affect EENT: conjunctiva pink, mucous membranes moist, neck supple without masses CARDIAC:  RRR, no murmur heard, no peripheral edema PULM: Normal respiratory effort, lungs CTA bilaterally, no wheezing ABDOMEN:  soft, nontender, nondistended, no obvious masses, no hepatomegaly,  normal bowel sounds SKIN:  turgor, no lesions seen Musculoskeletal:  Normal muscle tone, normal strength NEURO: Alert and oriented x 3, no focal neurologic deficits   ASSESSMENT and PLAN:  1. 71 yo female with [facility] of colon cancer in mother in her 109's.  She is overdue for surveillance colonoscopy, Last one was in 2007 with findings of a very redundant colon. The cecum was reached but the prep was only fair. Findings included diverticulosis, no polyps. Patient has no GI complaints. Her bowel habits are baseline. No blood in stool. No abdominal pain or weight loss. Patient is already scheduled for colonoscopy late March with Dr.Nandegam. The risks and benefits of the procedure were discussed and the patient agrees to proceed. Patient is on Plavix which will need to be held prior to the procedure, see #2  2. CAD, on chronic plavix because allergic to ASA. Hold plavix 5 days before colonoscopy - will instruct when and how to resume after procedure. Patient understands that there is a low but real risk of cardiovascular event such as heart attack /  Thrombosis while off plavix. Will communicate by phone or EMR with patient's prescribing provider to confirm that holding plavix is reasonable in this case.   3. Occasional GERD. Manages symptoms well with portion control.  4. Grade I diastolic heart failure on recent echo. Followed by Dr. Marigene Ehlers. On lasix  5. DM 2. On Metformin. Hgb a1c < 7.   6. Morbid obesity. BMI Rupert , NP 05/12/2016, 10:58 AM

## 2016-05-13 ENCOUNTER — Encounter: Payer: Self-pay | Admitting: Adult Health

## 2016-05-13 ENCOUNTER — Ambulatory Visit (INDEPENDENT_AMBULATORY_CARE_PROVIDER_SITE_OTHER): Payer: Medicare Other | Admitting: Adult Health

## 2016-05-13 VITALS — BP 129/79 | HR 94 | Ht 64.5 in | Wt 265.0 lb

## 2016-05-13 DIAGNOSIS — G43009 Migraine without aura, not intractable, without status migrainosus: Secondary | ICD-10-CM

## 2016-05-13 DIAGNOSIS — I2510 Atherosclerotic heart disease of native coronary artery without angina pectoris: Secondary | ICD-10-CM | POA: Diagnosis not present

## 2016-05-13 DIAGNOSIS — G4733 Obstructive sleep apnea (adult) (pediatric): Secondary | ICD-10-CM

## 2016-05-13 DIAGNOSIS — Z9989 Dependence on other enabling machines and devices: Secondary | ICD-10-CM

## 2016-05-13 MED ORDER — SUMATRIPTAN 20 MG/ACT NA SOLN: NASAL | 1 refills | Status: DC

## 2016-05-13 NOTE — Progress Notes (Signed)
I have read the note, and I agree with the clinical assessment and plan.  WILLIS,CHARLES KEITH   

## 2016-05-13 NOTE — Progress Notes (Signed)
Reviewed and agree with documentation and assessment and plan. K. Veena Dorri Ozturk , MD   

## 2016-05-13 NOTE — Patient Instructions (Signed)
Continue Topamax and Imitrex If your symptoms worsen or you develop new symptoms please let us know.

## 2016-05-13 NOTE — Progress Notes (Signed)
PATIENT: Heather [name] DOB: [birth year]  REASON FOR VISIT: follow up- migraine headache, OSA HISTORY FROM: patient and Husband  HISTORY OF PRESENT ILLNESS: Heather [name] is a 71 year old female with a history of migraine headaches and obstructive sleep apnea. She returns today for follow-up. The patient reports that her headaches have been controlled since she was placed on Topamax. She is currently taking Topamax 100 mg at bedtime. she reports that when she does get a headache the Imitrex nasal spray works well for her. She states that she rarely has to use this. She followed up with Dr. Brett Fairy in December for obstructive sleep apnea. She has excellent compliance and good treatment of her apnea. Overall the patient states that she's been doing well. She does note some muscle cramps in the lower extremities. She states that the cramps are better when her legs are swollen. She is on Lasix and potassium. She returns today for an evaluation. HISTORY   REVIEW OF SYSTEMS: Out of a complete 14 system review of symptoms, the patient complains only of the following symptoms, and all other reviewed systems are negative. Anemia, back pain  ALLERGIES: Allergies  Allergen Reactions  . Aspirin   . Coconut Oil   . Latex   . Lisinopril     REACTION: cough  . Metoprolol Itching  . Peanut-Containing Drug Products   . Penicillins     REACTION: rash  . Prednisone   . Strawberry Extract     HOME MEDICATIONS: Outpatient Medications Prior to Visit  Medication Sig Dispense Refill  . acetaminophen (TYLENOL) 500 MG tablet Take 650 mg by mouth 3 (three) times daily as needed for pain.     [name] ALLEGRA-D ALLERGY & CONGESTION 180-240 MG 24 hr tablet TAKE 1 TABLET BY MOUTH DAILY 30 tablet 0  . Calcium Carbonate-Vitamin D (CALCIUM 600 + D PO) Take 2 tablets by mouth daily.      . clopidogrel (PLAVIX) 75 MG tablet TAKE 1 TABLET EVERY DAY 90 tablet 3  . Ferrous Sulfate (IRON) 325 (65 FE) MG TABS Take 1  tablet by mouth daily.      . furosemide (LASIX) 40 MG tablet Take 1 tablet (40 mg total) by mouth daily. 90 tablet 3  . gabapentin (NEURONTIN) 100 MG capsule Take 2 capsules (200 mg total) by mouth at bedtime. 60 capsule 3  . glucosamine-chondroitin 500-400 MG tablet Take 1 tablet by mouth 2 (two) times daily.      [name] losartan (COZAAR) 25 MG tablet TAKE 1 TABLET EVERY DAY 90 tablet 3  . metFORMIN (GLUCOPHAGE-XR) 500 MG 24 hr tablet TAKE 2 TABLETS TWICE DAILY 360 tablet 3  . modafinil (PROVIGIL) 200 MG tablet Take 1 tablet (200 mg total) by mouth daily. 30 tablet 1  . Multiple Vitamin (MULTIVITAMIN) tablet Take 1 tablet by mouth daily.      . Na Sulfate-K Sulfate-Mg Sulf 17.5-3.13-1.6 GM/180ML SOLN 1 suprep kit for prep of colonoscopy 354 mL 0  . naproxen sodium (ANAPROX) 220 MG tablet Take 220 mg by mouth as needed.    . NON FORMULARY Tumeric    . Nutritional Supplements (PEPTAMEN 1.5 PO) Take by mouth 2 (two) times daily.    [name] omeprazole (PRILOSEC) 20 MG capsule Take 20 mg by mouth daily as needed.     . potassium chloride SA (K-DUR,KLOR-CON) 20 MEQ tablet TAKE 1 TABLET EVERY DAY 90 tablet 3  . pseudoephedrine-acetaminophen (TYLENOL SINUS) 30-500 MG TABS tablet Take 1 tablet by mouth as  needed.    . rosuvastatin (CRESTOR) 40 MG tablet TAKE 1 TABLET EVERY DAY 90 tablet 3  . SF 5000 PLUS 1.1 % CREA dental cream   0  . SUMAtriptan (IMITREX) 20 MG/ACT nasal spray Place 1 spray (20 mg total) into the nose every 2 (two) hours as needed for migraine. 12 Inhaler 1  . topiramate (TOPAMAX) 100 MG tablet Take 1 tablet (100 mg total) by mouth daily. 90 tablet 3  . traMADol (ULTRAM) 50 MG tablet Take 1 tablet (50 mg total) by mouth every 12 (twelve) hours as needed. 30 tablet 0  . vitamin E 200 UNIT capsule Take 200 Units by mouth daily.     No facility-administered medications prior to visit.     PAST MEDICAL HISTORY: Past Medical History:  Diagnosis Date  . Abnormal Pap smear   . ALLERGIC RHINITIS  10/22/2006  . ANEMIA 12/18/2008  . ASYMPTOMATIC POSTMENOPAUSAL STATUS 11/22/2007  . Complication of anesthesia   . Degenerative arthritis   . DIABETES MELLITUS, TYPE II 01/13/2007  . Dyslipidemia   . Fibroid   . GERD 07/20/2007  . GOITER, MULTINODULAR 07/20/2007  . Headache(784.0) 07/20/2007  . HEARING LOSS 11/22/2007  . Hx of colposcopy with cervical biopsy   . HYPERCHOLESTEROLEMIA 01/13/2007  . Hyperglycemia   . HYPERTENSION 10/22/2006  . NASH (nonalcoholic steatohepatitis)   . Nocturnal hypoxemia 02/17/2013  . Obesity   . Obstructive sleep apnea   . OSTEOARTHRITIS 10/22/2006  . Other chronic nonalcoholic liver disease 1/96/2229    PAST SURGICAL HISTORY: Past Surgical History:  Procedure Laterality Date  . BREAST SURGERY     Breast reduction  . CATARACT EXTRACTION, BILATERAL    . DEXA  08/2005  . DILATION AND CURETTAGE OF UTERUS    . ELECTROCARDIOGRAM  10/15/2006  . ESOPHAGOGASTRODUODENOSCOPY  12/08/2005  . Stress Cardiolite  10/21/2005  . sweat gland removal    . WISDOM TOOTH EXTRACTION      FAMILY HISTORY: Family History  Problem Relation Age of Onset  . Asthma Mother   . Depression Mother   . Bipolar disorder Mother   . Dementia Mother   . Arthritis Mother   . Breast cancer Mother     primary  . Colon cancer Mother     mets from breast  . Hypertension Father   . Migraines Father   . Cancer Maternal Aunt   . Heart disease Maternal Grandmother   . Cancer Maternal Grandfather     SOCIAL HISTORY: Social History   Social History  . Marital status: Married    Spouse name: [name]  . Number of children: 2  . Years of education: College   Occupational History  . Retired    Social History Main Topics  . Smoking status: Former Smoker    Packs/day: 2.00    Years: 20.00    Types: Cigarettes    Quit date: 03/03/1979  . Smokeless tobacco: Never Used  . Alcohol use Yes     Comment: rare  . Drug use: No  . Sexual activity: Yes    Birth control/ protection:  Surgical   Other Topics Concern  . Not on file   Social History Narrative   Patient is married [name]).   Patient has two children.   Patient is retired.   Patient has a college education.   Patient is right-handed.   Patient lives at home with family.   Caffeine Use: 2 soda every other day      PHYSICAL EXAM  Vitals:   05/13/16 1135  BP: 129/79  Pulse: 94  Weight: 265 lb (120.2 kg)  Height: 5' 4.5" (1.638 m)   Body mass index is 44.78 kg/m.  Generalized: Well developed, in no acute distress   Neurological examination  Mentation: Alert oriented to time, place, history taking. Follows all commands speech and language fluent Cranial nerve II-XII: Pupils were equal round reactive to light. Extraocular movements were full, visual field were full on confrontational test. Facial sensation and strength were normal. Uvula tongue midline. Head turning and shoulder shrug  were normal and symmetric. Motor: The motor testing reveals 5 over 5 strength of all 4 extremities. Good symmetric motor tone is noted throughout.  Sensory: Sensory testing is intact to soft touch on all 4 extremities. No evidence of extinction is noted.  Coordination: Cerebellar testing reveals good finger-nose-finger and heel-to-shin bilaterally.  Gait and station: Gait is normal. Romberg is negative. No drift is seen.  Reflexes: Deep tendon reflexes are symmetric and normal bilaterally.   DIAGNOSTIC DATA (LABS, IMAGING, TESTING) - I reviewed patient records, labs, notes, testing and imaging myself where available.  Lab Results  Component Value Date   WBC 7.2 03/05/2016   HGB 12.8 03/05/2016   HCT 38.8 03/05/2016   MCV 87.1 03/05/2016   PLT 300.0 03/05/2016      Component Value Date/Time   NA 140 03/05/2016 0958   K 3.5 03/05/2016 0958   CL 102 03/05/2016 0958   CO2 33 (H) 03/05/2016 0958   GLUCOSE 116 (H) 03/05/2016 0958   BUN 15 03/05/2016 0958   CREATININE 0.87 03/05/2016 0958   CREATININE  0.81 12/28/2011 0853   CALCIUM 9.4 03/05/2016 0958   PROT 8.0 02/15/2015 1010   ALBUMIN 3.7 02/15/2015 1010   AST 19 02/15/2015 1010   ALT 14 02/15/2015 1010   ALKPHOS 67 02/15/2015 1010   BILITOT 0.3 02/15/2015 1010   GFRNONAA 94.49 12/13/2009 0908   GFRAA 94 11/11/2007 0912   Lab Results  Component Value Date   CHOL 170 03/05/2016   HDL 65.50 03/05/2016   LDLCALC 78 03/05/2016   TRIG 130.0 03/05/2016   CHOLHDL 3 03/05/2016   Lab Results  Component Value Date   HGBA1C 6.4 03/05/2016   Lab Results  Component Value Date   VITAMINB12 563 12/25/2010   Lab Results  Component Value Date   TSH 1.76 03/05/2016      ASSESSMENT AND PLAN 71 y.o. year old female  has a past medical history of Abnormal Pap smear; ALLERGIC RHINITIS (10/22/2006); ANEMIA (12/18/2008); ASYMPTOMATIC POSTMENOPAUSAL STATUS (11/22/2007); Complication of anesthesia; Degenerative arthritis; DIABETES MELLITUS, TYPE II (01/13/2007); Dyslipidemia; Fibroid; GERD (07/20/2007); GOITER, MULTINODULAR (07/20/2007); EPPIRJJO(841.6) (07/20/2007); HEARING LOSS (11/22/2007); colposcopy with cervical biopsy; HYPERCHOLESTEROLEMIA (01/13/2007); Hyperglycemia; HYPERTENSION (10/22/2006); NASH (nonalcoholic steatohepatitis); Nocturnal hypoxemia (02/17/2013); Obesity; Obstructive sleep apnea; OSTEOARTHRITIS (10/22/2006); and Other chronic nonalcoholic liver disease (08/06/3014). here with:  1. Migraine headaches 2. Obstructive sleep apnea  Overall the patient is doing well. She will continue on Topamax 100 mg at bedtime. She will also continue using Imitrex nasal spray as needed for migraine headaches. Advised that she should follow-up with her primary care regarding muscle cramps that this could be related to Lasix and potassium. She should continue using the CPAP nightly. Advised that if her symptoms worsen or she develops new symptoms she should let us know. She will follow-up in one year with Cecille Rubin.     Ward Givens, MSN,  NP-C 05/13/2016, 11:57 AM [facility] [id number]  [address] [place], [place] [address] [phone]

## 2016-05-15 ENCOUNTER — Telehealth: Payer: Self-pay | Admitting: Endocrinology

## 2016-05-15 NOTE — Telephone Encounter (Signed)
Faxed request to hold Plavix twice still no response , called Dr Rosario Adie office today waiting on call back  Called and informed pt

## 2016-05-15 NOTE — Telephone Encounter (Signed)
Patient is wanting to know when she should stop taking Plavix. Best # [phone]

## 2016-05-15 NOTE — Telephone Encounter (Signed)
Seems as though Dr Einar Gip is original presciber but patient has not seen him in 3-4 years  Waiting on call back from [facility]

## 2016-05-15 NOTE — Telephone Encounter (Signed)
Pt stated that she is now seeing Heather [name].

## 2016-05-15 NOTE — Telephone Encounter (Signed)
Called Dr Rosario Adie office again  [phone]     Spoke with Medical Assistant  She stated per Dr Loanne Drilling he is NOT in charge of her plavix. They contacted the patient to inform her that Dr Loanne Drilling does not handle Plavix.   I have not heard from the patient will contact her about who prescribes her Plavix.  Called patient she stated [name] is the one who has prescribed it, Looks like Dr Loanne Drilling sent her in a reill in 2016... Patient stated she has seen [name] once- Will try and contact there office to see if he will ok for patient to hold Plavix   May have to cancel patients procedure until we get this issue resolved     [facility] and she is not a patient there   She went to [facility] in 2016 ,  Called and left message for nurse to call me back in regards to this patients Plavix  In the meantime I cancelled patients colonoscopy until 06/18/2016 at 10am Pt aware of new date and times, She still claims that Gracy Bruins prescribed her Plavix   I told patient she may want to contact there office because it seems her plavix is not being managed

## 2016-05-15 NOTE — Telephone Encounter (Signed)
Please ask GI to call cardiol.

## 2016-05-15 NOTE — Telephone Encounter (Signed)
I will need to check to see if patient has a current EF since she has been going to the [facility]

## 2016-05-15 NOTE — Telephone Encounter (Signed)
Genella Mech from [facility] called and said that this patient is having a procedure next Wednesday, and she needs to know when this patient needs to come off of the Plavix before the procedure.  She said you may get back in touch with her through [facility].

## 2016-05-15 NOTE — Telephone Encounter (Signed)
please call patient: What heart dr do you see?

## 2016-05-16 ENCOUNTER — Other Ambulatory Visit: Payer: Self-pay | Admitting: Endocrinology

## 2016-05-17 NOTE — Telephone Encounter (Signed)
Please refill furosemide prn Please refer plavix refill to cardiol.

## 2016-05-18 ENCOUNTER — Telehealth (HOSPITAL_COMMUNITY): Payer: Self-pay

## 2016-05-18 NOTE — Telephone Encounter (Signed)
Robin with [facility] called to get clearance for colonoscopy and parameters to hold and restart plavix pre and post procedure. Will forward to Dr. Aundra Dubin to review.  Renee Pain, RN

## 2016-05-18 NOTE — Telephone Encounter (Signed)
Heather [name], See message from MD.

## 2016-05-18 NOTE — Telephone Encounter (Signed)
She had Echo on 05/08/16 with normal LVEF 30-05% with diastolic dysfunction. Thanks

## 2016-05-18 NOTE — Telephone Encounter (Signed)
Ok to hold plavix 5 days before procedure  Pt aware  Dr Silverio Decamp will you need a current EF on this patient? Or do you already have it  Thanks

## 2016-05-18 NOTE — Telephone Encounter (Signed)
OK to hold Plavix for procedure, can stop 5 days prior and restart afterwards.

## 2016-05-18 NOTE — Telephone Encounter (Signed)
Called patient to inform hold Plavix 5 days prior  See Phone note

## 2016-05-19 ENCOUNTER — Ambulatory Visit (INDEPENDENT_AMBULATORY_CARE_PROVIDER_SITE_OTHER): Payer: Self-pay | Admitting: Neurology

## 2016-05-19 ENCOUNTER — Encounter: Payer: Self-pay | Admitting: Neurology

## 2016-05-19 ENCOUNTER — Ambulatory Visit (INDEPENDENT_AMBULATORY_CARE_PROVIDER_SITE_OTHER): Payer: Medicare Other | Admitting: Neurology

## 2016-05-19 DIAGNOSIS — R29898 Other symptoms and signs involving the musculoskeletal system: Secondary | ICD-10-CM

## 2016-05-19 DIAGNOSIS — R700 Elevated erythrocyte sedimentation rate: Secondary | ICD-10-CM

## 2016-05-19 NOTE — Procedures (Signed)
     HISTORY:  Heather [name] is a 71 year old patient with a history of morbid obesity and diabetes and a several year history of difficulty with walking, and bilateral thigh weakness. The patient has been found to have a chronicly elevated sedimentation rate. The patient has been sent over for evaluation of the leg weakness.  NERVE CONDUCTION STUDIES:  Nerve conduction studies were done on both lower extremities. The distal motor latencies for the peroneal nerves were normal bilaterally with low motor amplitudes for these nerves bilaterally. The distal motor latencies for the posterior tibial nerves were prolonged bilaterally with normal motor amplitudes seen for these nerves. The nerve conduction velocities for the peroneal and posterior tibial nerves were normal bilaterally. The sensory latencies for the sural and peroneal nerves were unobtainable bilaterally.  EMG STUDIES:  EMG study was performed on the right lower extremity:  The tibialis anterior muscle reveals 2 to 4K motor units with full recruitment. No fibrillations or positive waves were seen. The peroneus tertius muscle reveals 2 to 5K motor units with decreased recruitment. 1+ positive waves were seen. The medial gastrocnemius muscle reveals 1 to 3K motor units with decreased recruitment. No fibrillations or positive waves were seen. The vastus lateralis muscle reveals 2 to 4K motor units with full recruitment. No fibrillations or positive waves were seen. The iliopsoas muscle reveals 2 to 4K motor units with full recruitment. No fibrillations or positive waves were seen. The biceps femoris muscle (long head) reveals 2 to 3K motor units with full recruitment. No fibrillations or positive waves were seen. The lumbosacral paraspinal muscles were tested at 3 levels, and revealed no abnormalities of insertional activity at all 3 levels tested. There was good relaxation.   IMPRESSION:  Nerve conduction studies done on both lower  extremities shows evidence of a primarily axonal peripheral neuropathy of moderate severity. This may be related to diabetes. EMG evaluation of the right lower extremity shows mild distal chronic and acute denervation consistent with the diagnosis of peripheral neuropathy. There is no evidence of an overlying lumbosacral radiculopathy or definite evidence of a myopathic disorder.  Jill Alexanders MD 05/19/2016 11:28 AM  [facility] [address], [place] [address]  Phone [phone] Fax [phone]

## 2016-05-19 NOTE — Progress Notes (Signed)
I appreciate the NCV. EMG study and results.    Dyllan Hughett, MD

## 2016-05-19 NOTE — Progress Notes (Signed)
Please refer to EMG and nerve conduction study procedure note. 

## 2016-05-20 ENCOUNTER — Encounter: Payer: Medicare Other | Admitting: Gastroenterology

## 2016-05-20 ENCOUNTER — Other Ambulatory Visit (HOSPITAL_COMMUNITY): Payer: Self-pay | Admitting: Cardiology

## 2016-05-20 MED ORDER — CLOPIDOGREL BISULFATE 75 MG PO TABS: 75.0000 mg | ORAL_TABLET | Freq: Every day | ORAL | 0 refills | Status: DC

## 2016-05-20 MED ORDER — CLOPIDOGREL BISULFATE 75 MG PO TABS: 75.0000 mg | ORAL_TABLET | Freq: Every day | ORAL | 3 refills | Status: DC

## 2016-05-21 ENCOUNTER — Other Ambulatory Visit (HOSPITAL_COMMUNITY): Payer: Self-pay

## 2016-05-21 ENCOUNTER — Telehealth: Payer: Self-pay | Admitting: Cardiology

## 2016-05-21 NOTE — Telephone Encounter (Signed)
Pt calling requesting a refill on Clopidogrel 75 mg tablet. Pt stated that she has only 1 pill left. Please advise

## 2016-05-22 ENCOUNTER — Other Ambulatory Visit (HOSPITAL_COMMUNITY): Payer: Self-pay | Admitting: *Deleted

## 2016-05-25 ENCOUNTER — Encounter: Payer: Self-pay | Admitting: Adult Health

## 2016-05-26 NOTE — Progress Notes (Signed)
[name] [place], [place] [address] Phone: [phone] Subjective:    I'm seeing this patient by the request  of:  [name], MD   CC: Bilateral knee pain follow-up  ITG:[id number]  Heather [name] is a 71 y.o. female coming in with complaint of right Knee pain. Patient was seen Previously for weeks ago and was given injections. Patient has tolerated fairly well. Patient sates that she is doing better overall. Still has some instability. Awaiting the fitting of her brace but does have the brace.   Patient is having some mild worsening back pain. Has known arthritic changes. Patient was to do aquatic therapy because patient is been hurting she has not been doing it as frequently. Patient is not taking the gabapentin regularly. States that the pain seems to be as little bit worse at night. Recently did have an EMG that did show patient having peripheral neuropathy and is wondering what else to do.   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   Past Medical History:  Diagnosis Date  . Abnormal Pap smear   . ALLERGIC RHINITIS 10/22/2006  . ANEMIA 12/18/2008  . ASYMPTOMATIC POSTMENOPAUSAL STATUS 11/22/2007  . Complication of anesthesia   . Degenerative arthritis   . DIABETES MELLITUS, TYPE II 01/13/2007  . Dyslipidemia   . Fibroid   . GERD 07/20/2007  . GOITER, MULTINODULAR 07/20/2007  . Headache(784.0) 07/20/2007  . HEARING LOSS 11/22/2007  . Hx of colposcopy with cervical biopsy   . HYPERCHOLESTEROLEMIA 01/13/2007  . Hyperglycemia   . HYPERTENSION 10/22/2006  . NASH (nonalcoholic steatohepatitis)   . Nocturnal hypoxemia 02/17/2013  . Obesity   . Obstructive sleep apnea   . OSTEOARTHRITIS 10/22/2006  . Other chronic nonalcoholic liver disease 05/09/4074   Past Surgical History:  Procedure Laterality Date  . BREAST SURGERY     Breast reduction  . CATARACT EXTRACTION, BILATERAL    . DEXA  08/2005    . DILATION AND CURETTAGE OF UTERUS    . ELECTROCARDIOGRAM  10/15/2006  . ESOPHAGOGASTRODUODENOSCOPY  12/08/2005  . Stress Cardiolite  10/21/2005  . sweat gland removal    . WISDOM TOOTH EXTRACTION     Social History  Substance Use Topics  . Smoking status: Former Smoker    Packs/day: 2.00    Years: 20.00    Types: Cigarettes    Quit date: 03/03/1979  . Smokeless tobacco: Never Used  . Alcohol use Yes     Comment: rare   Allergies  Allergen Reactions  . Aspirin   . Coconut Oil   . Latex   . Lisinopril     REACTION: cough  . Metoprolol Itching  . Peanut-Containing Drug Products   . Penicillins     REACTION: rash  . Prednisone   . Strawberry Extract    Family History  Problem Relation Age of Onset  . Asthma Mother   . Depression Mother   . Bipolar disorder Mother   . Dementia Mother   . Arthritis Mother   . Breast cancer Mother     primary  . Colon cancer Mother     mets from breast  . Hypertension Father   . Migraines Father   . Cancer Maternal Aunt   . Heart disease Maternal Grandmother   . Cancer Maternal Grandfather     Past medical history, social, surgical and family history all reviewed in electronic medical record.   Review of Systems: No headache,  visual changes, nausea, vomiting, diarrhea, constipation, dizziness, abdominal pain, skin rash, fevers, chills, night sweats, weight loss, swollen lymph nodes, , chest pain, shortness of breath, mood changes.   Objective   Blood pressure 110/76, pulse 90, height 5' 4.5" (1.638 m), weight 268 lb (121.6 kg), SpO2 96 %.    Systems examined below as of 05/27/16 General: NAD A&O x3 mood, affect normal  HEENT: Pupils equal, extraocular movements intact no nystagmus Respiratory: not short of breath at rest or with speaking Cardiovascular: No lower extremity edema, non tender Skin: Warm dry intact with no signs of infection or rash on extremities or on axial skeleton. Abdomen: Soft nontender, no masses Neuro:  Cranial nerves  intact, neurovascularly intact in all extremities with 2+ DTRs and 2+ pulses. Lymph: No lymphadenopathy appreciated today  Gait still antalgic gait but improved.   MSK: Non tender with full range of motion and good stability and symmetric strength and tone of shoulders, elbows, wrist,  hips and ankles bilaterally.  Arthritic changes of multiple joints Knee: valgus deformity noted. Large thigh to calf ratio.  Tender to palpation over medial and PF joint line less than previous exam ROM full in flexion and extension and lower leg rotation. instability with valgus force.  painful patellar compression. Patellar glide with moderate crepitus. Patellar and quadriceps tendons unremarkable. Hamstring and quadriceps strength is normal.  Back exam shows diffuse tenderness of the paraspinal musculature mostly around the thoracic or lumbar and lumbosacral juncture's negative straight leg test. Tightness of the Fabere test bilaterally     Impression and Recommendations:     This case required medical decision making of moderate complexity.

## 2016-05-27 ENCOUNTER — Ambulatory Visit (INDEPENDENT_AMBULATORY_CARE_PROVIDER_SITE_OTHER): Payer: Medicare Other | Admitting: Family Medicine

## 2016-05-27 ENCOUNTER — Encounter: Payer: Self-pay | Admitting: Family Medicine

## 2016-05-27 DIAGNOSIS — I2510 Atherosclerotic heart disease of native coronary artery without angina pectoris: Secondary | ICD-10-CM | POA: Diagnosis not present

## 2016-05-27 DIAGNOSIS — G6281 Critical illness polyneuropathy: Secondary | ICD-10-CM

## 2016-05-27 DIAGNOSIS — M170 Bilateral primary osteoarthritis of knee: Secondary | ICD-10-CM

## 2016-05-27 DIAGNOSIS — G629 Polyneuropathy, unspecified: Secondary | ICD-10-CM | POA: Insufficient documentation

## 2016-05-27 DIAGNOSIS — M5136 Other intervertebral disc degeneration, lumbar region: Secondary | ICD-10-CM | POA: Diagnosis not present

## 2016-05-27 NOTE — Assessment & Plan Note (Signed)
Encourage patient to take the gabapentin on areolar basis. We discussed further workup would include an MRI and potential injections which patient declined. We discussed that some of this could be secondary to her peripheral neuropathy as well. Patient will consider taking the medicines on a more regular basis. Follow-up again in 4 weeks.

## 2016-05-27 NOTE — Assessment & Plan Note (Signed)
Encouraged patient to take gabapentin on a regular basis. We discussed other over-the-counter medications a could be beneficial. Patient will follow-up with primary care provider for further treatment.

## 2016-05-27 NOTE — Patient Instructions (Signed)
Good to see you  Alvera Singh is your friend.  Gabapentin 100-279m at night can help with the peripheral neuropathy.  B12 10077m daily  B6 20085maily  See me again in 4 weeks and we will do another injection before your trip

## 2016-05-27 NOTE — Assessment & Plan Note (Signed)
Patient feels that she is doing relatively well after the injections. Still a candidate for viscous supplementation again if necessary. Patient will consider this. Discussed icing regimen. Patient come back and see me again in 4 weeks to make sure patient is well. Still being fitted for custom brace.

## 2016-05-28 NOTE — Telephone Encounter (Signed)
Received email and [employer] form for PA imitrex nasal spray.  Filled out and need signature.  In MM/NP inbox.

## 2016-05-29 NOTE — Telephone Encounter (Signed)
Faxed yesterday the [employer] form back, received this am ? About why [employer]'s low cost formulary generic amerge or maxalt- and maxalt odt not appropriate.  I LMVM for pt to return call.  She has taken imitrex oral previously.  MM/NP and CD out today.

## 2016-06-02 NOTE — Telephone Encounter (Signed)
Spoke to MM, completed another form from [place] , needing signature then will refax.

## 2016-06-03 ENCOUNTER — Encounter: Payer: Self-pay | Admitting: Family Medicine

## 2016-06-04 NOTE — Progress Notes (Signed)
Letter written to [facility] for approval of sumatriptan nasal spray, which is non-formulary med. Placed in Megan's box for review/signature.

## 2016-06-05 ENCOUNTER — Encounter: Payer: Self-pay | Admitting: Adult Health

## 2016-06-05 ENCOUNTER — Telehealth: Payer: Self-pay | Admitting: Adult Health

## 2016-06-05 NOTE — Telephone Encounter (Signed)
Yes I have,  I will call them today.

## 2016-06-05 NOTE — Telephone Encounter (Signed)
I called and spoke to [facility], [phone] relating to this ref ID [id number].  I relayed that pt has taken imitrex oral and due to not as effective that she now is taking imitrex nasal spray which has worked well for her since 2014 , the formulary options are not appropriate for her at this time as she is stable.  They will make determination and fax [name] response.

## 2016-06-05 NOTE — Telephone Encounter (Signed)
Lucy from [phone] is asking for a call for this pt (Reference OP#[id number]) she stated there is clinical information needed to complete there review for this pt.

## 2016-06-05 NOTE — Telephone Encounter (Signed)
Received fax via [facility]. Sumatriptan 79m nasal spray approved effective until 03/01/17. Member ID: [id number]

## 2016-06-05 NOTE — Telephone Encounter (Signed)
Verneita Griffes or Lovey Newcomer can you take care of this on Monday?

## 2016-06-07 ENCOUNTER — Other Ambulatory Visit: Payer: Self-pay | Admitting: Family Medicine

## 2016-06-11 ENCOUNTER — Other Ambulatory Visit: Payer: Self-pay | Admitting: Endocrinology

## 2016-06-17 ENCOUNTER — Encounter: Payer: Self-pay | Admitting: Family Medicine

## 2016-06-17 ENCOUNTER — Ambulatory Visit (INDEPENDENT_AMBULATORY_CARE_PROVIDER_SITE_OTHER): Payer: Medicare Other | Admitting: Family Medicine

## 2016-06-17 DIAGNOSIS — M170 Bilateral primary osteoarthritis of knee: Secondary | ICD-10-CM | POA: Diagnosis not present

## 2016-06-17 DIAGNOSIS — I2510 Atherosclerotic heart disease of native coronary artery without angina pectoris: Secondary | ICD-10-CM

## 2016-06-17 DIAGNOSIS — G6281 Critical illness polyneuropathy: Secondary | ICD-10-CM | POA: Diagnosis not present

## 2016-06-17 NOTE — Progress Notes (Signed)
Pre-visit discussion using our clinic review tool. No additional management support is needed unless otherwise documented below in the visit note.  

## 2016-06-17 NOTE — Assessment & Plan Note (Signed)
Stable at the moment. Discussed with patient about icing, home exercise, which activities doing which ones to avoid. Patient will continue with conservative therapy at this point. Can repeat injections if needed intermittently. Otherwise patient will follow-up as needed. Does have custom brace ordered.

## 2016-06-17 NOTE — Assessment & Plan Note (Signed)
Responded very well to over-the-counter supplementations.

## 2016-06-17 NOTE — Progress Notes (Signed)
[name] [place],  [address] Phone: [phone] Subjective:    I'm seeing this patient by the request  of:  [name], MD   CC: Bilateral knee pain follow-up  OTL:[id number]  Heather [name] is a 71 y.o. female coming in with complaint of right Knee pain. Patient was seen Previously for weeks ago and was given injections. Patient has tolerated fairly well. Patient sates that she is doing better overall. Still has some instability. Awaiting the fitting of her brace but does have the brace.   Patient is having some mild worsening back pain. Has known arthritic changes. Patient was to do aquatic therapy because patient is been hurting she has not been doing it as frequently. Patient is not taking the gabapentin regularly. States that the pain seems to be as little bit worse at night. Recently did have an EMG that did show patient having peripheral neuropathy   Patient started on B6 and B12. Patient states that this helped the neuropathy almost immediately. Patient is feeling much better. Noticed more strengthening the muscles around the knees. Walking without a cane. Has made significant progress and is very happy with the results of far.  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   Past Medical History:  Diagnosis Date  . Abnormal Pap smear   . ALLERGIC RHINITIS 10/22/2006  . ANEMIA 12/18/2008  . ASYMPTOMATIC POSTMENOPAUSAL STATUS 11/22/2007  . Complication of anesthesia   . Degenerative arthritis   . DIABETES MELLITUS, TYPE II 01/13/2007  . Dyslipidemia   . Fibroid   . GERD 07/20/2007  . GOITER, MULTINODULAR 07/20/2007  . Headache(784.0) 07/20/2007  . HEARING LOSS 11/22/2007  . Hx of colposcopy with cervical biopsy   . HYPERCHOLESTEROLEMIA 01/13/2007  . Hyperglycemia   . HYPERTENSION 10/22/2006  . NASH (nonalcoholic steatohepatitis)   . Nocturnal hypoxemia 02/17/2013  . Obesity   .  Obstructive sleep apnea   . OSTEOARTHRITIS 10/22/2006  . Other chronic nonalcoholic liver disease 7/41/6384   Past Surgical History:  Procedure Laterality Date  . BREAST SURGERY     Breast reduction  . CATARACT EXTRACTION, BILATERAL    . DEXA  08/2005  . DILATION AND CURETTAGE OF UTERUS    . ELECTROCARDIOGRAM  10/15/2006  . ESOPHAGOGASTRODUODENOSCOPY  12/08/2005  . Stress Cardiolite  10/21/2005  . sweat gland removal    . WISDOM TOOTH EXTRACTION     Social History  Substance Use Topics  . Smoking status: Former Smoker    Packs/day: 2.00    Years: 20.00    Types: Cigarettes    Quit date: 03/03/1979  . Smokeless tobacco: Never Used  . Alcohol use Yes     Comment: rare   Allergies  Allergen Reactions  . Aspirin   . Coconut Oil   . Latex   . Lisinopril     REACTION: cough  . Metoprolol Itching  . Peanut-Containing Drug Products   . Penicillins     REACTION: rash  . Prednisone   . Strawberry Extract    Family History  Problem Relation Age of Onset  . Asthma Mother   . Depression Mother   . Bipolar disorder Mother   . Dementia Mother   . Arthritis Mother   . Breast cancer Mother     primary  . Colon cancer Mother     mets from breast  . Hypertension Father   . Migraines Father   . Cancer Maternal Aunt   .  Heart disease Maternal Grandmother   . Cancer Maternal Grandfather     Past medical history, social, surgical and family history all reviewed in electronic medical record.   Review of Systems: No headache, visual changes, nausea, vomiting, diarrhea, constipation, dizziness, abdominal pain, skin rash, fevers, chills, night sweats, weight loss, swollen lymph nodes, body aches, joint swelling, muscle aches, chest pain, shortness of breath, mood changes.    Objective   Blood pressure 104/70, pulse 84, resp. rate 16, weight 267 lb (121.1 kg), SpO2 98 %.    Systems examined below as of 06/17/16 General: NAD A&O x3 mood, affect normal  HEENT: Pupils equal,  extraocular movements intact no nystagmus Respiratory: not short of breath at rest or with speaking Cardiovascular: No lower extremity edema, non tender Skin: Warm dry intact with no signs of infection or rash on extremities or on axial skeleton. Abdomen: Soft nontender, no masses Neuro: Cranial nerves  intact, neurovascularly intact in all extremities with 2+ DTRs and 2+ pulses. Lymph: No lymphadenopathy appreciated today  Gait normal with good balance and coordination.  MSK: Non tender with full range of motion and good stability and symmetric strength and tone of shoulders, elbows, wrist,  hips and ankles bilaterally.  Arthritic changes of multiple joints Knee: Bilateral valgus deformity noted. Large thigh to calf ratio.  Minimal tenderness noted today. ROM full in flexion and extension and lower leg rotation. instability with valgus force.  painful patellar compression. Patellar glide with moderate crepitus. Patellar and quadriceps tendons unremarkable. Hamstring and quadriceps strength is normal.      Impression and Recommendations:     This case required medical decision making of moderate complexity.

## 2016-06-18 ENCOUNTER — Encounter: Payer: Self-pay | Admitting: Gastroenterology

## 2016-06-18 ENCOUNTER — Ambulatory Visit (AMBULATORY_SURGERY_CENTER): Payer: Medicare Other | Admitting: Gastroenterology

## 2016-06-18 VITALS — BP 116/74 | HR 80 | Temp 97.5°F | Resp 14 | Ht 64.5 in | Wt 265.0 lb

## 2016-06-18 DIAGNOSIS — Z800 Family history of malignant neoplasm of digestive organs: Secondary | ICD-10-CM

## 2016-06-18 DIAGNOSIS — D125 Benign neoplasm of sigmoid colon: Secondary | ICD-10-CM | POA: Diagnosis not present

## 2016-06-18 DIAGNOSIS — G4733 Obstructive sleep apnea (adult) (pediatric): Secondary | ICD-10-CM | POA: Diagnosis not present

## 2016-06-18 DIAGNOSIS — K6289 Other specified diseases of anus and rectum: Secondary | ICD-10-CM | POA: Diagnosis not present

## 2016-06-18 DIAGNOSIS — Z1212 Encounter for screening for malignant neoplasm of rectum: Secondary | ICD-10-CM

## 2016-06-18 DIAGNOSIS — K5730 Diverticulosis of large intestine without perforation or abscess without bleeding: Secondary | ICD-10-CM | POA: Diagnosis not present

## 2016-06-18 DIAGNOSIS — K635 Polyp of colon: Secondary | ICD-10-CM | POA: Diagnosis not present

## 2016-06-18 DIAGNOSIS — Z1211 Encounter for screening for malignant neoplasm of colon: Secondary | ICD-10-CM | POA: Diagnosis present

## 2016-06-18 MED ORDER — SODIUM CHLORIDE 0.9 % IV SOLN: 500.0000 mL | INTRAVENOUS | Status: DC

## 2016-06-18 NOTE — Progress Notes (Signed)
A/ox3 pleased with MAC, report to Megan RN 

## 2016-06-18 NOTE — Patient Instructions (Signed)
Handouts given on polyps and diverticulosis  YOU HAD AN ENDOSCOPIC PROCEDURE TODAY: Refer to the procedure report and other information in the discharge instructions given to you for any specific questions about what was found during the examination. If this information does not answer your questions, please call [place] office at [phone] to clarify.   YOU SHOULD EXPECT: Some feelings of bloating in the abdomen. Passage of more gas than usual. Walking can help get rid of the air that was put into your GI tract during the procedure and reduce the bloating. If you had a lower endoscopy (such as a colonoscopy or flexible sigmoidoscopy) you may notice spotting of blood in your stool or on the toilet paper. Some abdominal soreness may be present for a day or two, also.  DIET: Your first meal following the procedure should be a light meal and then it is ok to progress to your normal diet. A half-sandwich or bowl of soup is an example of a good first meal. Heavy or fried foods are harder to digest and may make you feel nauseous or bloated. Drink plenty of fluids but you should avoid alcoholic beverages for 24 hours. If you had a esophageal dilation, please see attached instructions for diet.    ACTIVITY: Your care partner should take you home directly after the procedure. You should plan to take it easy, moving slowly for the rest of the day. You can resume normal activity the day after the procedure however YOU SHOULD NOT DRIVE, use power tools, machinery or perform tasks that involve climbing or major physical exertion for 24 hours (because of the sedation medicines used during the test).   SYMPTOMS TO REPORT IMMEDIATELY: A gastroenterologist can be reached at any hour. Please call [phone]  for any of the following symptoms:  Following lower endoscopy (colonoscopy, flexible sigmoidoscopy) Excessive amounts of blood in the stool  Significant tenderness, worsening of abdominal pains  Swelling of  the abdomen that is new, acute  Fever of 100 or higher    FOLLOW UP:  If any biopsies were taken you will be contacted by phone or by letter within the next 1-3 weeks. Call [phone]  if you have not heard about the biopsies in 3 weeks.  Please also call with any specific questions about appointments or follow up tests.

## 2016-06-18 NOTE — Op Note (Addendum)
[place] Patient Name: Heather [name] Procedure Date: 06/18/2016 10:00 AM MRN: [id number] Endoscopist: Mauri Pole , MD Age: 71 Referring MD:  Date of Birth: [birth year] Gender: Female Account #: [id number] Procedure:                Colonoscopy Indications:              Screening for colorectal malignant neoplasm, Last                            colonoscopy: 2007 Medicines:                Monitored Anesthesia Care Procedure:                Pre-Anesthesia Assessment:                           - Prior to the procedure, a History and Physical                            was performed, and patient medications and                            allergies were reviewed. The patient's tolerance of                            previous anesthesia was also reviewed. The risks                            and benefits of the procedure and the sedation                            options and risks were discussed with the patient.                            All questions were answered, and informed consent                            was obtained. Prior Anticoagulants: The patient                            last took Plavix (clopidogrel) 5 days prior to the                            procedure. ASA Grade Assessment: III - A patient                            with severe systemic disease. After reviewing the                            risks and benefits, the patient was deemed in                            satisfactory condition to undergo the procedure.  After obtaining informed consent, the colonoscope                            was passed under direct vision. Throughout the                            procedure, the patient's blood pressure, pulse, and                            oxygen saturations were monitored continuously. The                            Colonoscope was introduced through the anus and                            advanced to the the terminal  ileum, with                            identification of the appendiceal orifice and IC                            valve. The colonoscopy was technically difficult                            and complex due to extrinsic compression, multiple                            diverticula in the colon, restricted mobility of                            the colon, a tortuous colon and the patient's body                            habitus with large panus and multiple ventral                            hernia. Successful completion of the procedure was                            aided by changing the patient to a prone position                            and applying abdominal pressure. The patient                            tolerated the procedure well. The quality of the                            bowel preparation was good. The terminal ileum,                            ileocecal valve, appendiceal orifice, and rectum  were photographed. Scope In: 10:19:31 AM Scope Out: 10:53:28 AM Scope Withdrawal Time: 0 hours 12 minutes 2 seconds  Total Procedure Duration: 0 hours 33 minutes 57 seconds  Findings:                 The perianal and digital rectal examinations were                            normal.                           Multiple small and large-mouthed diverticula were                            found in the entire colon. There was narrowing of                            the colon in association with the diverticular                            opening. There was evidence of diverticular spasm.                            There was evidence of an impacted diverticulum.                           A 5 mm polyp was found in the sigmoid colon. The                            polyp was sessile. The polyp was removed with a                            cold snare. Resection and retrieval were complete.                           Anal papilla(e) were hypertrophied. Biopsies were                             taken with a cold forceps for histology. Complications:            No immediate complications. Estimated Blood Loss:     Estimated blood loss was minimal. Impression:               - Severe diverticulosis in the entire examined                            colon. There was narrowing of the colon in                            association with the diverticular opening. There                            was evidence of diverticular spasm. There was  evidence of an impacted diverticulum.                           - One 5 mm polyp in the sigmoid colon, removed with                            a cold snare. Resected and retrieved.                           - Anal papilla(e) were hypertrophied. Biopsied. Recommendation:           - Patient has a contact number available for                            emergencies. The signs and symptoms of potential                            delayed complications were discussed with the                            patient. Return to normal activities tomorrow.                            Written discharge instructions were provided to the                            patient.                           - Resume previous diet.                           - Continue present medications.                           - Await pathology results.                           - Repeat colonoscopy in 5-10 years for surveillance                            based on pathology results.                           - Resume Plavix (clopidogrel) at prior dose                            tomorrow. Refer to managing physician for further                            adjustment of therapy. Mauri Pole, MD 06/18/2016 10:59:58 AM This report has been signed electronically.

## 2016-06-18 NOTE — Progress Notes (Signed)
Called to room to assist during endoscopic procedure.  Patient ID and intended procedure confirmed with present staff. Received instructions for my participation in the procedure from the performing physician.  

## 2016-06-19 ENCOUNTER — Telehealth: Payer: Self-pay | Admitting: *Deleted

## 2016-06-19 NOTE — Telephone Encounter (Signed)
  Follow up Call-  Call back number 06/18/2016  Post procedure Call Back phone  # [phone]  Permission to leave phone message Yes  Some recent data might be hidden     Patient questions:  Do you have a fever, pain , or abdominal swelling? No. Pain Score  0 *  Have you tolerated food without any problems? Yes.    Have you been able to return to your normal activities? Yes.    Do you have any questions about your discharge instructions: Diet   No. Medications  No. Follow up visit  No.  Do you have questions or concerns about your Care? No.  Actions: * If pain score is 4 or above: No action needed, pain <4.

## 2016-06-23 ENCOUNTER — Other Ambulatory Visit: Payer: Self-pay | Admitting: Endocrinology

## 2016-06-23 ENCOUNTER — Encounter: Payer: Self-pay | Admitting: Gastroenterology

## 2016-06-29 ENCOUNTER — Ambulatory Visit (INDEPENDENT_AMBULATORY_CARE_PROVIDER_SITE_OTHER): Payer: Medicare Other | Admitting: Endocrinology

## 2016-06-29 ENCOUNTER — Encounter: Payer: Self-pay | Admitting: Endocrinology

## 2016-06-29 VITALS — BP 122/80 | HR 97 | Ht 64.5 in | Wt 260.0 lb

## 2016-06-29 DIAGNOSIS — I2510 Atherosclerotic heart disease of native coronary artery without angina pectoris: Secondary | ICD-10-CM

## 2016-06-29 DIAGNOSIS — R600 Localized edema: Secondary | ICD-10-CM | POA: Diagnosis not present

## 2016-06-29 MED ORDER — TRIAMCINOLONE ACETONIDE 0.1 % EX CREA: 1.0000 "application " | TOPICAL_CREAM | Freq: Four times a day (QID) | CUTANEOUS | 1 refills | Status: DC

## 2016-06-29 NOTE — Patient Instructions (Signed)
I have sent a prescription to your pharmacy, for a skin cream for the rash. You will get better much faster if you elevate your foot above the rest of your body, as much as you can.  Due to your last blood test results, we cannot safely increase the fluid pill.   I'll see you next time.

## 2016-06-29 NOTE — Progress Notes (Signed)
Subjective:    Patient ID: Heather [name], female    DOB: 08-08-1945, 71 y.o.   MRN: [id number]  HPI Pt states 1 week of slight rash on the legs, and assoc itching.   She was rx'ed compression stockings in the past, but she wears intermittently.  She says they are too tight.   Past Medical History:  Diagnosis Date  . Abnormal Pap smear   . ALLERGIC RHINITIS 10/22/2006  . ANEMIA 12/18/2008  . ASYMPTOMATIC POSTMENOPAUSAL STATUS 11/22/2007  . Complication of anesthesia   . Degenerative arthritis   . DIABETES MELLITUS, TYPE II 01/13/2007  . Dyslipidemia   . Fibroid   . GERD 07/20/2007  . GOITER, MULTINODULAR 07/20/2007  . Headache(784.0) 07/20/2007  . HEARING LOSS 11/22/2007  . Hx of colposcopy with cervical biopsy   . HYPERCHOLESTEROLEMIA 01/13/2007  . Hyperglycemia   . HYPERTENSION 10/22/2006  . NASH (nonalcoholic steatohepatitis)   . Nocturnal hypoxemia 02/17/2013  . Obesity   . Obstructive sleep apnea   . OSTEOARTHRITIS 10/22/2006  . Other chronic nonalcoholic liver disease 2/42/6834    Past Surgical History:  Procedure Laterality Date  . BREAST SURGERY     Breast reduction  . CATARACT EXTRACTION, BILATERAL    . DEXA  08/2005  . DILATION AND CURETTAGE OF UTERUS    . ELECTROCARDIOGRAM  10/15/2006  . ESOPHAGOGASTRODUODENOSCOPY  12/08/2005  . Stress Cardiolite  10/21/2005  . sweat gland removal    . WISDOM TOOTH EXTRACTION      Social History   Social History  . Marital status: Married    Spouse name: [name]  . Number of children: 2  . Years of education: College   Occupational History  . Retired    Social History Main Topics  . Smoking status: Former Smoker    Packs/day: 2.00    Years: 20.00    Types: Cigarettes    Quit date: 03/03/1979  . Smokeless tobacco: Never Used  . Alcohol use Yes     Comment: rare  . Drug use: No  . Sexual activity: Yes    Birth control/ protection: Surgical   Other Topics Concern  . Not on file   Social History Narrative   Patient is married [name]).   Patient has two children.   Patient is retired.   Patient has a college education.   Patient is right-handed.   Patient lives at home with family.   Caffeine Use: 2 soda every other day    Current Outpatient Prescriptions on File Prior to Visit  Medication Sig Dispense Refill  . acetaminophen (TYLENOL) 500 MG tablet Take 650 mg by mouth 3 (three) times daily as needed for pain.     [name] ALLEGRA-D ALLERGY & CONGESTION 180-240 MG 24 hr tablet TAKE 1 TABLET BY MOUTH DAILY 30 tablet 0  . Calcium Carbonate-Vitamin D (CALCIUM 600 + D PO) Take 2 tablets by mouth daily.      . clopidogrel (PLAVIX) 75 MG tablet Take 1 tablet (75 mg total) by mouth daily. 90 tablet 3  . cyanocobalamin 1000 MCG tablet Take 1,000 mcg by mouth daily.    . Ferrous Sulfate (IRON) 325 (65 FE) MG TABS Take 1 tablet by mouth daily.      . furosemide (LASIX) 40 MG tablet TAKE 1 TABLET EVERY DAY 90 tablet 3  . gabapentin (NEURONTIN) 100 MG capsule TAKE 2 CAPSULES(200 MG) BY MOUTH AT BEDTIME 60 capsule 0  . glucosamine-chondroitin 500-400 MG tablet Take 1 tablet by mouth  2 (two) times daily.      [name] losartan (COZAAR) 25 MG tablet TAKE 1 TABLET EVERY DAY 90 tablet 3  . metFORMIN (GLUCOPHAGE-XR) 500 MG 24 hr tablet TAKE 2 TABLETS TWICE DAILY 360 tablet 3  . modafinil (PROVIGIL) 200 MG tablet Take 1 tablet (200 mg total) by mouth daily. 30 tablet 1  . Multiple Vitamin (MULTIVITAMIN) tablet Take 1 tablet by mouth daily.      . Na Sulfate-K Sulfate-Mg Sulf 17.5-3.13-1.6 GM/180ML SOLN 1 suprep kit for prep of colonoscopy 354 mL 0  . naproxen sodium (ANAPROX) 220 MG tablet Take 220 mg by mouth as needed.    . NON FORMULARY Tumeric    . Nutritional Supplements (PEPTAMEN 1.5 PO) Take by mouth 2 (two) times daily.    [name] omeprazole (PRILOSEC) 20 MG capsule Take 20 mg by mouth daily as needed.     . potassium chloride SA (K-DUR,KLOR-CON) 20 MEQ tablet TAKE 1 TABLET EVERY DAY 90 tablet 3  .  pseudoephedrine-acetaminophen (TYLENOL SINUS) 30-500 MG TABS tablet Take 1 tablet by mouth as needed.    . pyridoxine (B-6) 200 MG tablet Take 200 mg by mouth daily.    . rosuvastatin (CRESTOR) 40 MG tablet TAKE 1 TABLET EVERY DAY 90 tablet 3  . SF 5000 PLUS 1.1 % CREA dental cream   0  . SUMAtriptan (IMITREX) 20 MG/ACT nasal spray One spray (20 mg) in the nostril at the onset of migraine.May repeat in 2 hours if needed. No more than 2 sprays in a 24 hour period 12 Inhaler 1  . topiramate (TOPAMAX) 100 MG tablet Take 1 tablet (100 mg total) by mouth daily. 90 tablet 3  . traMADol (ULTRAM) 50 MG tablet Take 1 tablet (50 mg total) by mouth every 12 (twelve) hours as needed. 30 tablet 0  . vitamin E 200 UNIT capsule Take 200 Units by mouth daily.     Current Facility-Administered Medications on File Prior to Visit  Medication Dose Route Frequency Provider Last Rate Last Dose  . 0.9 %  sodium chloride infusion  500 mL Intravenous Continuous Mauri Pole, MD        Allergies  Allergen Reactions  . Aspirin   . Coconut Oil   . Latex   . Lisinopril     REACTION: cough  . Metoprolol Itching  . Peanut-Containing Drug Products   . Penicillins     REACTION: rash  . Prednisone   . Strawberry Extract     Family History  Problem Relation Age of Onset  . Asthma Mother   . Depression Mother   . Bipolar disorder Mother   . Dementia Mother   . Arthritis Mother   . Breast cancer Mother     primary  . Colon cancer Mother     mets from breast  . Hypertension Father   . Migraines Father   . Cancer Maternal Aunt   . Heart disease Maternal Grandmother   . Cancer Maternal Grandfather     BP 122/80   Pulse 97   Ht 5' 4.5" (1.638 m)   Wt 260 lb (117.9 kg)   SpO2 94%   BMI 43.94 kg/m    Review of Systems No change in chronic swelling of the legs.  no sob.      Objective:   Physical Exam VITAL SIGNS:  See vs page.  GENERAL: no distress.  Ext: 2+ bilat leg edema. Skin: slight  spotty red rash on the legs.  Lab Results  Component Value Date   CREATININE 0.87 03/05/2016   BUN 15 03/05/2016   NA 140 03/05/2016   K 3.5 03/05/2016   CL 102 03/05/2016   CO2 33 (H) 03/05/2016   BNP has been normal.      Assessment & Plan:  Localized edema, persistent.  No evidence of CHF.  Hypercarbia.  We can't increase lasix.  Stasis dermatitis, new.    Patient Instructions  I have sent a prescription to your pharmacy, for a skin cream for the rash. You will get better much faster if you elevate your foot above the rest of your body, as much as you can.  Due to your last blood test results, we cannot safely increase the fluid pill.   I'll see you next time.

## 2016-07-09 ENCOUNTER — Encounter: Payer: Self-pay | Admitting: Family Medicine

## 2016-07-13 ENCOUNTER — Other Ambulatory Visit: Payer: Self-pay | Admitting: Family Medicine

## 2016-07-14 ENCOUNTER — Other Ambulatory Visit: Payer: Self-pay | Admitting: Endocrinology

## 2016-07-14 ENCOUNTER — Telehealth: Payer: Self-pay | Admitting: Endocrinology

## 2016-07-14 NOTE — Telephone Encounter (Signed)
Pt disussed with Dr. Tamala Julian via a Mychart email about getting a Rx for rollater.  Pt would like the Rx sent in.  Would like to get it from [facility] medical supply. [place]

## 2016-07-14 NOTE — Telephone Encounter (Signed)
Spoke with patient. She would like a letter stating the need for rollator to take to medical supply. Letter left at front desk for patient to pick up.

## 2016-07-14 NOTE — Telephone Encounter (Signed)
Left message for patient to call back about Rx. I called [facility]. They do not need an Rx for the rollator. If patient brings in a prescription, this will save her in sales tax, otherwise this company is a Agricultural consultant so no prescription necessary.

## 2016-07-14 NOTE — Telephone Encounter (Signed)
Refill done.  

## 2016-07-22 DIAGNOSIS — M25569 Pain in unspecified knee: Secondary | ICD-10-CM | POA: Diagnosis not present

## 2016-07-22 DIAGNOSIS — M549 Dorsalgia, unspecified: Secondary | ICD-10-CM | POA: Diagnosis not present

## 2016-07-22 DIAGNOSIS — R700 Elevated erythrocyte sedimentation rate: Secondary | ICD-10-CM | POA: Diagnosis not present

## 2016-08-11 ENCOUNTER — Ambulatory Visit (INDEPENDENT_AMBULATORY_CARE_PROVIDER_SITE_OTHER): Payer: Medicare Other | Admitting: Family Medicine

## 2016-08-11 ENCOUNTER — Encounter: Payer: Self-pay | Admitting: Family Medicine

## 2016-08-11 DIAGNOSIS — I2510 Atherosclerotic heart disease of native coronary artery without angina pectoris: Secondary | ICD-10-CM

## 2016-08-11 DIAGNOSIS — M170 Bilateral primary osteoarthritis of knee: Secondary | ICD-10-CM | POA: Diagnosis not present

## 2016-08-11 NOTE — Patient Instructions (Signed)
Good to see you  Injected the knees today  Hope it helps You know the drill Have the other injections if needed If pain comes back see me again in 4 weeks!

## 2016-08-11 NOTE — Progress Notes (Signed)
[name] [address], [place] [address] Phone: [phone] Subjective:    I'm seeing this patient by the request  of:  [name], MD   CC: Bilateral knee pain follow-up  [id number]  Heather [name] is a 71 y.o. female coming in with complaint of right Knee pain. Worsening pain bilaterally. Patient states that it is severe enough that is stopping her from daily activities and patient did not go on a trip to [place]. Patient did [place] recently and states that that has helped and she is doing long activities. Patient denies any worsening swelling but states that the instability of the knees seems to be severe.   Patient started on B6 and B12. Patient states that this helped the neuropathy continues to be somewhat of a problem but has seemed to plateau at this point.  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   Past Medical History:  Diagnosis Date  . Abnormal Pap smear   . ALLERGIC RHINITIS 10/22/2006  . ANEMIA 12/18/2008  . ASYMPTOMATIC POSTMENOPAUSAL STATUS 11/22/2007  . Complication of anesthesia   . Degenerative arthritis   . DIABETES MELLITUS, TYPE II 01/13/2007  . Dyslipidemia   . Fibroid   . GERD 07/20/2007  . GOITER, MULTINODULAR 07/20/2007  . Headache(784.0) 07/20/2007  . HEARING LOSS 11/22/2007  . Hx of colposcopy with cervical biopsy   . HYPERCHOLESTEROLEMIA 01/13/2007  . Hyperglycemia   . HYPERTENSION 10/22/2006  . NASH (nonalcoholic steatohepatitis)   . Nocturnal hypoxemia 02/17/2013  . Obesity   . Obstructive sleep apnea   . OSTEOARTHRITIS 10/22/2006  . Other chronic nonalcoholic liver disease 07/18/8414   Past Surgical History:  Procedure Laterality Date  . BREAST SURGERY     Breast reduction  . CATARACT EXTRACTION, BILATERAL    . DEXA  08/2005  . DILATION AND CURETTAGE OF UTERUS    . ELECTROCARDIOGRAM  10/15/2006  . ESOPHAGOGASTRODUODENOSCOPY  12/08/2005  . Stress  Cardiolite  10/21/2005  . sweat gland removal    . WISDOM TOOTH EXTRACTION     Social History  Substance Use Topics  . Smoking status: Former Smoker    Packs/day: 2.00    Years: 20.00    Types: Cigarettes    Quit date: 03/03/1979  . Smokeless tobacco: Never Used  . Alcohol use Yes     Comment: rare   Allergies  Allergen Reactions  . Aspirin   . Coconut Oil   . Latex   . Lisinopril     REACTION: cough  . Metoprolol Itching  . Peanut-Containing Drug Products   . Penicillins     REACTION: rash  . Prednisone   . Strawberry Extract    Family History  Problem Relation Age of Onset  . Asthma Mother   . Depression Mother   . Bipolar disorder Mother   . Dementia Mother   . Arthritis Mother   . Breast cancer Mother        primary  . Colon cancer Mother        mets from breast  . Hypertension Father   . Migraines Father   . Cancer Maternal Aunt   . Heart disease Maternal Grandmother   . Cancer Maternal Grandfather     Past medical history, social, surgical and family history all reviewed in electronic medical record.   Review of Systems: No headache, visual changes, nausea, vomiting, diarrhea, constipation, dizziness, abdominal pain, skin rash, fevers, chills, night sweats, weight  loss, swollen lymph nodes,, chest pain, shortness of breath, mood changes.  Positive body aches and muscle aches   Objective   Blood pressure 124/86, pulse 80, weight 269 lb (122 kg).    Systems examined below as of 08/11/16 General: NAD A&O x3 mood, affect normal  HEENT: Pupils equal, extraocular movements intact no nystagmus Respiratory: not short of breath at rest or with speaking Cardiovascular: No lower extremity edema, non tender Skin: Warm dry intact with no signs of infection or rash on extremities or on axial skeleton. Abdomen: Soft nontender, no masses Neuro: Cranial nerves  intact, neurovascularly intact in all extremities with 2+ DTRs and 2+ pulses. Lymph: No lymphadenopathy  appreciated today  Gait normal with good balance and coordination.  MSK: Non tender with full range of motion and good stability and symmetric strength and tone of shoulders, elbows, wrist,  hips and ankles bilaterally.  Arthritic changes of multiple joints Knee: Bilateral valgus deformity noted. Large thigh to calf ratio.  Minimal tenderness noted today. Mild loss lacking the last 5 of extension on the left side compared to the contralateral side instability with valgus force.  painful patellar compression. Patellar glide with moderate crepitus. Patellar and quadriceps tendons unremarkable. Hamstring and quadriceps strength is normal.  After informed written and verbal consent, patient was seated on exam table. Right knee was prepped with alcohol swab and utilizing anterolateral approach, patient's right knee space was injected with 4:1  marcaine 0.5%: Kenalog 2m/dL. Patient tolerated the procedure well without immediate complications.  After informed written and verbal consent, patient was seated on exam table. Left knee was prepped with alcohol swab and utilizing anterolateral approach, patient's left knee space was injected with 4:1  marcaine 0.5%: Kenalog 49mdL. Patient tolerated the procedure well without immediate complications.   Impression and Recommendations:     This case required medical decision making of moderate complexity.

## 2016-08-11 NOTE — Assessment & Plan Note (Signed)
Bilateral injections given today. Patient had worsening symptoms. Tolerated procedure well. We discussed icing regimen and home exercises. We discussed which activities to do in which ones to avoid. Patient will increase activity as tolerated. Patient may be a candidate for viscous supplementation if needed. Follow-up again in 4-6 weeks.

## 2016-08-12 ENCOUNTER — Other Ambulatory Visit: Payer: Self-pay | Admitting: Endocrinology

## 2016-08-26 ENCOUNTER — Telehealth: Payer: Self-pay | Admitting: Nurse Practitioner

## 2016-08-26 NOTE — Telephone Encounter (Signed)
Pt would like Heather [name] to check and make sure her appt on June 03, 2016 was coded correctly due to medicare not covering the ov.

## 2016-08-27 NOTE — Telephone Encounter (Signed)
Nursing is not helpful with this issue. Can you help her?

## 2016-08-27 NOTE — Telephone Encounter (Signed)
I have reached out to our Coder and the codes are correct. She is going to re-arrange them to hopefully get this paid. Sometimes insurances do not like the order that codes are submitted. Varies by insurance. Hopefully this new order will get the claim paid.  Pt was called. We apologized and explained the process to her. Advised to call back if she had any other issues with this.

## 2016-09-08 ENCOUNTER — Encounter: Payer: Self-pay | Admitting: Endocrinology

## 2016-09-08 ENCOUNTER — Ambulatory Visit (INDEPENDENT_AMBULATORY_CARE_PROVIDER_SITE_OTHER): Payer: Medicare Other | Admitting: Endocrinology

## 2016-09-08 VITALS — BP 132/80 | HR 93 | Ht 64.5 in | Wt 270.0 lb

## 2016-09-08 DIAGNOSIS — I2510 Atherosclerotic heart disease of native coronary artery without angina pectoris: Secondary | ICD-10-CM | POA: Diagnosis not present

## 2016-09-08 DIAGNOSIS — E119 Type 2 diabetes mellitus without complications: Secondary | ICD-10-CM

## 2016-09-08 LAB — POCT GLYCOSYLATED HEMOGLOBIN (HGB A1C): HEMOGLOBIN A1C: 6.3

## 2016-09-08 NOTE — Patient Instructions (Addendum)
Please continue the same metformin Please come back for a follow-up appointment in 6 months.

## 2016-09-08 NOTE — Progress Notes (Signed)
Subjective:    Patient ID: Heather [name], female    DOB: 1945-09-10, 71 y.o.   MRN: [id number]  HPI Pt returns for f/u of diabetes mellitus: DM type: 2 Dx'ed: 9798 Complications: none Therapy: metformin.  GDM: never DKA: never Severe hypoglycemia: never. Pancreatitis: never. Other: she did not tolerate actos (edema); pt says she was told by surgeon she was not a good candidate for weight loss surgery, due to abd hernia; she does not check cbg's.  Interval history:pt states she feels well in general.  no cbg record, but states cbg's are well-controlled Past Medical History:  Diagnosis Date  . Abnormal Pap smear   . ALLERGIC RHINITIS 10/22/2006  . ANEMIA 12/18/2008  . ASYMPTOMATIC POSTMENOPAUSAL STATUS 11/22/2007  . Complication of anesthesia   . Degenerative arthritis   . DIABETES MELLITUS, TYPE II 01/13/2007  . Dyslipidemia   . Fibroid   . GERD 07/20/2007  . GOITER, MULTINODULAR 07/20/2007  . Headache(784.0) 07/20/2007  . HEARING LOSS 11/22/2007  . Hx of colposcopy with cervical biopsy   . HYPERCHOLESTEROLEMIA 01/13/2007  . Hyperglycemia   . HYPERTENSION 10/22/2006  . NASH (nonalcoholic steatohepatitis)   . Nocturnal hypoxemia 02/17/2013  . Obesity   . Obstructive sleep apnea   . OSTEOARTHRITIS 10/22/2006  . Other chronic nonalcoholic liver disease 11/21/1939    Past Surgical History:  Procedure Laterality Date  . BREAST SURGERY     Breast reduction  . CATARACT EXTRACTION, BILATERAL    . DEXA  08/2005  . DILATION AND CURETTAGE OF UTERUS    . ELECTROCARDIOGRAM  10/15/2006  . ESOPHAGOGASTRODUODENOSCOPY  12/08/2005  . Stress Cardiolite  10/21/2005  . sweat gland removal    . WISDOM TOOTH EXTRACTION      Social History   Social History  . Marital status: Married    Spouse name: [name]  . Number of children: 2  . Years of education: College   Occupational History  . Retired    Social History Main Topics  . Smoking status: Former Smoker    Packs/day: 2.00    Years: 20.00    Types: Cigarettes    Quit date: 03/03/1979  . Smokeless tobacco: Never Used  . Alcohol use Yes     Comment: rare  . Drug use: No  . Sexual activity: Yes    Birth control/ protection: Surgical   Other Topics Concern  . Not on file   Social History Narrative   Patient is married [name]).   Patient has two children.   Patient is retired.   Patient has a college education.   Patient is right-handed.   Patient lives at home with family.   Caffeine Use: 2 soda every other day    Current Outpatient Prescriptions on File Prior to Visit  Medication Sig Dispense Refill  . acetaminophen (TYLENOL) 500 MG tablet Take 650 mg by mouth 3 (three) times daily as needed for pain.     [name] ALLEGRA-D ALLERGY & CONGESTION 180-240 MG 24 hr tablet TAKE 1 TABLET BY MOUTH DAILY 30 tablet 0  . Calcium Carbonate-Vitamin D (CALCIUM 600 + D PO) Take 2 tablets by mouth daily.      . clopidogrel (PLAVIX) 75 MG tablet Take 1 tablet (75 mg total) by mouth daily. 90 tablet 3  . cyanocobalamin 1000 MCG tablet Take 1,000 mcg by mouth daily.    . Ferrous Sulfate (IRON) 325 (65 FE) MG TABS Take 1 tablet by mouth daily.      . furosemide (  LASIX) 40 MG tablet TAKE 1 TABLET EVERY DAY 90 tablet 3  . gabapentin (NEURONTIN) 100 MG capsule TAKE 2 CAPSULES(200 MG) BY MOUTH AT BEDTIME 180 capsule 1  . glucosamine-chondroitin 500-400 MG tablet Take 1 tablet by mouth 2 (two) times daily.      [name] losartan (COZAAR) 25 MG tablet TAKE 1 TABLET EVERY DAY 90 tablet 3  . metFORMIN (GLUCOPHAGE-XR) 500 MG 24 hr tablet TAKE 2 TABLETS TWICE DAILY 360 tablet 3  . modafinil (PROVIGIL) 200 MG tablet Take 1 tablet (200 mg total) by mouth daily. 30 tablet 1  . Multiple Vitamin (MULTIVITAMIN) tablet Take 1 tablet by mouth daily.      . naproxen sodium (ANAPROX) 220 MG tablet Take 220 mg by mouth as needed.    . NON FORMULARY Tumeric    . Nutritional Supplements (PEPTAMEN 1.5 PO) Take by mouth 2 (two) times daily.    [name] omeprazole  (PRILOSEC) 20 MG capsule Take 20 mg by mouth daily as needed.     . potassium chloride SA (K-DUR,KLOR-CON) 20 MEQ tablet TAKE 1 TABLET EVERY DAY 90 tablet 3  . pseudoephedrine-acetaminophen (TYLENOL SINUS) 30-500 MG TABS tablet Take 1 tablet by mouth as needed.    . pyridoxine (B-6) 200 MG tablet Take 200 mg by mouth daily.    . rosuvastatin (CRESTOR) 40 MG tablet TAKE 1 TABLET EVERY DAY 90 tablet 3  . SF 5000 PLUS 1.1 % CREA dental cream   0  . SUMAtriptan (IMITREX) 20 MG/ACT nasal spray One spray (20 mg) in the nostril at the onset of migraine.May repeat in 2 hours if needed. No more than 2 sprays in a 24 hour period 12 Inhaler 1  . topiramate (TOPAMAX) 100 MG tablet Take 1 tablet (100 mg total) by mouth daily. 90 tablet 3  . traMADol (ULTRAM) 50 MG tablet Take 1 tablet (50 mg total) by mouth every 12 (twelve) hours as needed. 30 tablet 0  . triamcinolone cream (KENALOG) 0.1 % Apply 1 application topically 4 (four) times daily. As needed for rash 80 g 1  . vitamin E 200 UNIT capsule Take 200 Units by mouth daily.     Current Facility-Administered Medications on File Prior to Visit  Medication Dose Route Frequency Provider Last Rate Last Dose  . 0.9 %  sodium chloride infusion  500 mL Intravenous Continuous Nandigam, Venia Minks, MD        Allergies  Allergen Reactions  . Aspirin   . Coconut Oil   . Latex   . Lisinopril     REACTION: cough  . Metoprolol Itching  . Peanut-Containing Drug Products   . Penicillins     REACTION: rash  . Prednisone   . Strawberry Extract     Family History  Problem Relation Age of Onset  . Asthma Mother   . Depression Mother   . Bipolar disorder Mother   . Dementia Mother   . Arthritis Mother   . Breast cancer Mother        primary  . Colon cancer Mother        mets from breast  . Hypertension Father   . Migraines Father   . Cancer Maternal Aunt   . Heart disease Maternal Grandmother   . Cancer Maternal Grandfather     BP 132/80   Pulse  93   Ht 5' 4.5" (1.638 m)   Wt 270 lb (122.5 kg)   SpO2 95%   BMI 45.63 kg/m  Review of Systems Leg swelling is worse    Objective:   Physical Exam VITAL SIGNS:  See vs page.  GENERAL: no distress.  Pulses: dorsalis pedis intact bilat.   MSK: no deformity of the feet.  CV: 1+ bilat leg edema.   Skin:  no ulcer on the feet, but the skin is dry,and there are heavy calluses.  normal color and temp on the feet.  Neuro: sensation is intact to touch on the feet   A1c=6.3%    Assessment & Plan:  Type 2 DM: well-controlled Edema, persistent: We discussed the need for her to use her compression stockings  Patient Instructions  Please continue the same metformin Please come back for a follow-up appointment in 6 months.

## 2016-09-10 ENCOUNTER — Ambulatory Visit (INDEPENDENT_AMBULATORY_CARE_PROVIDER_SITE_OTHER): Payer: Medicare Other | Admitting: Family Medicine

## 2016-09-10 ENCOUNTER — Encounter: Payer: Self-pay | Admitting: Family Medicine

## 2016-09-10 DIAGNOSIS — M170 Bilateral primary osteoarthritis of knee: Secondary | ICD-10-CM

## 2016-09-10 DIAGNOSIS — I2510 Atherosclerotic heart disease of native coronary artery without angina pectoris: Secondary | ICD-10-CM

## 2016-09-10 NOTE — Assessment & Plan Note (Signed)
Body mass index is 46.35 kg/m. Patient's is putting significant stress on her joints. Patient is now encouraged to start losing weight and is motivated. Patient is going to see a [facility]. Hopefully this will be beneficial.

## 2016-09-10 NOTE — Assessment & Plan Note (Signed)
Doing well after the injection. Discussed with patient as well as doing well we can repeat every 3 months if necessary. We discussed icing regimen and home exercises. We discussed which activities doing which ones to avoid. Patient will start to increase activity as tolerated. Worsening symptoms patient will call me. Otherwise patient will follow-up again in 6-8 weeks.

## 2016-09-10 NOTE — Progress Notes (Signed)
[name] [address], [place] [address] Phone: [phone] Subjective:    I'm seeing this patient by the request  of:  [name], MD   CC: Bilateral knee pain follow-up  [id number]  Heather [name] is a 71 y.o. female coming in with complaint of Bilateral knee pain. Severe bone-on-bone arthritis. Last exam was given steroid injections in each knee. This was one month ago. Patient states  Very well at this time. Patient states and significant decrease in pain. Patient has been using her walker when she is off which is helped a little. Some mild achiness but is not doing the exercises regularly. No swelling in the knees.  Patient started on B6 and B12. Patient states that this helped the neuropathy and was doing well but didn't seem to be plateauing at last exam some. Patient states still doing better.  Patient is frustrated with her Matilde Haymaker get some weight loss and has not made any significant benefit. Wondering if there is someone who specializes in this.   Past Medical History:  Diagnosis Date  . Abnormal Pap smear   . ALLERGIC RHINITIS 10/22/2006  . ANEMIA 12/18/2008  . ASYMPTOMATIC POSTMENOPAUSAL STATUS 11/22/2007  . Complication of anesthesia   . Degenerative arthritis   . DIABETES MELLITUS, TYPE II 01/13/2007  . Dyslipidemia   . Fibroid   . GERD 07/20/2007  . GOITER, MULTINODULAR 07/20/2007  . Headache(784.0) 07/20/2007  . HEARING LOSS 11/22/2007  . Hx of colposcopy with cervical biopsy   . HYPERCHOLESTEROLEMIA 01/13/2007  . Hyperglycemia   . HYPERTENSION 10/22/2006  . NASH (nonalcoholic steatohepatitis)   . Nocturnal hypoxemia 02/17/2013  . Obesity   . Obstructive sleep apnea   . OSTEOARTHRITIS 10/22/2006  . Other chronic nonalcoholic liver disease 9/70/2637   Past Surgical History:  Procedure Laterality Date  . BREAST SURGERY     Breast reduction  . CATARACT EXTRACTION, BILATERAL    . DEXA  08/2005  . DILATION AND  CURETTAGE OF UTERUS    . ELECTROCARDIOGRAM  10/15/2006  . ESOPHAGOGASTRODUODENOSCOPY  12/08/2005  . Stress Cardiolite  10/21/2005  . sweat gland removal    . WISDOM TOOTH EXTRACTION     Social History  Substance Use Topics  . Smoking status: Former Smoker    Packs/day: 2.00    Years: 20.00    Types: Cigarettes    Quit date: 03/03/1979  . Smokeless tobacco: Never Used  . Alcohol use Yes     Comment: rare   Allergies  Allergen Reactions  . Aspirin   . Coconut Oil   . Latex   . Lisinopril     REACTION: cough  . Metoprolol Itching  . Peanut-Containing Drug Products   . Penicillins     REACTION: rash  . Prednisone   . Strawberry Extract    Family History  Problem Relation Age of Onset  . Asthma Mother   . Depression Mother   . Bipolar disorder Mother   . Dementia Mother   . Arthritis Mother   . Breast cancer Mother        primary  . Colon cancer Mother        mets from breast  . Hypertension Father   . Migraines Father   . Cancer Maternal Aunt   . Heart disease Maternal Grandmother   . Cancer Maternal Grandfather     Past medical history, social, surgical and family history all reviewed in electronic medical record.   Review  of Systems: No headache, visual changes, nausea, vomiting, diarrhea, constipation, dizziness, abdominal pain, skin rash, fevers, chills, night sweats, weight loss, swollen lymph nodes, body aches, joint swelling, muscle aches, chest pain, shortness of breath, mood changes.     Objective   Blood pressure 104/70, pulse 90, height 5' 4"  (1.626 m), weight 270 lb (122.5 kg).    Systems examined below as of 09/10/16 General: NAD A&O x3 mood, affect normal obese HEENT: Pupils equal, extraocular movements intact no nystagmus Respiratory: not short of breath at rest or with speaking Cardiovascular: No lower extremity edema, non tender Skin: Warm dry intact with no signs of infection or rash on extremities or on axial skeleton. Abdomen: Soft  nontender, no masses obese.  Neuro: Cranial nerves  intact, neurovascularly intact in all extremities with 2+ DTRs and 2+ pulses. Lymph: No lymphadenopathy appreciated today  Gait Antalgic gait MSK: tender with full range of motion and good stability and symmetric strength and tone of shoulders, elbows, wrist,  hips and ankles bilaterally.   Knee: Bilateral valgus deformity noted. Large thigh to calf ratio.  Tender to palpation over medial and PF joint line.  ROM full in flexion and extension and lower leg rotation. instability with valgus force.  painful patellar compression. Patellar glide with moderate crepitus. Patellar and quadriceps tendons unremarkable. Hamstring and quadriceps strength is normal. Contralateral knee shows      Impression and Recommendations:     This case required medical decision making of moderate complexity.

## 2016-09-10 NOTE — Patient Instructions (Signed)
Good to see you  Dr. Leafy Ro office should be calling you  You should do great  Try warm water on the hand in AM You have the patches and creams if you need it Get back in the pool! See me again in 6-8 weeks

## 2016-09-13 ENCOUNTER — Other Ambulatory Visit: Payer: Self-pay | Admitting: Endocrinology

## 2016-09-28 DIAGNOSIS — E119 Type 2 diabetes mellitus without complications: Secondary | ICD-10-CM | POA: Diagnosis not present

## 2016-10-10 ENCOUNTER — Other Ambulatory Visit: Payer: Self-pay | Admitting: Endocrinology

## 2016-10-19 ENCOUNTER — Encounter: Payer: Self-pay | Admitting: Family Medicine

## 2016-10-19 ENCOUNTER — Encounter: Payer: Self-pay | Admitting: Endocrinology

## 2016-10-19 ENCOUNTER — Ambulatory Visit (INDEPENDENT_AMBULATORY_CARE_PROVIDER_SITE_OTHER): Payer: Medicare Other | Admitting: Family Medicine

## 2016-10-19 DIAGNOSIS — M170 Bilateral primary osteoarthritis of knee: Secondary | ICD-10-CM | POA: Diagnosis not present

## 2016-10-19 DIAGNOSIS — D510 Vitamin B12 deficiency anemia due to intrinsic factor deficiency: Secondary | ICD-10-CM

## 2016-10-19 DIAGNOSIS — I2510 Atherosclerotic heart disease of native coronary artery without angina pectoris: Secondary | ICD-10-CM | POA: Diagnosis not present

## 2016-10-19 MED ORDER — DOXYCYCLINE HYCLATE 100 MG PO TABS: 100.0000 mg | ORAL_TABLET | Freq: Two times a day (BID) | ORAL | 0 refills | Status: AC

## 2016-10-19 MED ORDER — CYANOCOBALAMIN 1000 MCG/ML IJ SOLN
1000.0000 ug | Freq: Once | INTRAMUSCULAR | Status: AC
  Administered 2016-10-19: 1000 ug via INTRAMUSCULAR

## 2016-10-19 NOTE — Assessment & Plan Note (Signed)
Bilateral degenerative knee. Patient given repeat injections today. Will be traveling out of the country. We discussed icing regimen. Patient is having continuing stay active. Patient come back in 3-4 weeks and we'll consider viscous supplementation again.

## 2016-10-19 NOTE — Progress Notes (Signed)
[name] [address], [place] [address] Phone: [phone] Subjective:    I'm seeing this patient by the request  of:    CC: Bilateral knee pain  [id number]  Heather [name] is a 71 y.o. female coming in with complaint of bilateral knee pain. Patient has bone-on-bone osteophytic changes of knees. Patient was last given injections in the knees in June. We did discuss the possibility of viscous supplementation. Patient is now 9 weeks out from previous injections patient states worsening pain again. Certain daily activities. Is leaving the country and wants to be able to be more active. Using walker for most of her walking. This is causing her to have increasing back pain as well.     Past Medical History:  Diagnosis Date  . Abnormal Pap smear   . ALLERGIC RHINITIS 10/22/2006  . ANEMIA 12/18/2008  . ASYMPTOMATIC POSTMENOPAUSAL STATUS 11/22/2007  . Complication of anesthesia   . Degenerative arthritis   . DIABETES MELLITUS, TYPE II 01/13/2007  . Dyslipidemia   . Fibroid   . GERD 07/20/2007  . GOITER, MULTINODULAR 07/20/2007  . Headache(784.0) 07/20/2007  . HEARING LOSS 11/22/2007  . Hx of colposcopy with cervical biopsy   . HYPERCHOLESTEROLEMIA 01/13/2007  . Hyperglycemia   . HYPERTENSION 10/22/2006  . NASH (nonalcoholic steatohepatitis)   . Nocturnal hypoxemia 02/17/2013  . Obesity   . Obstructive sleep apnea   . OSTEOARTHRITIS 10/22/2006  . Other chronic nonalcoholic liver disease 11/03/90   Past Surgical History:  Procedure Laterality Date  . BREAST SURGERY     Breast reduction  . CATARACT EXTRACTION, BILATERAL    . DEXA  08/2005  . DILATION AND CURETTAGE OF UTERUS    . ELECTROCARDIOGRAM  10/15/2006  . ESOPHAGOGASTRODUODENOSCOPY  12/08/2005  . Stress Cardiolite  10/21/2005  . sweat gland removal    . WISDOM TOOTH EXTRACTION     Social History   Social History  . Marital status: Married    Spouse name: [name]  . Number of  children: 2  . Years of education: College   Occupational History  . Retired    Social History Main Topics  . Smoking status: Former Smoker    Packs/day: 2.00    Years: 20.00    Types: Cigarettes    Quit date: 03/03/1979  . Smokeless tobacco: Never Used  . Alcohol use Yes     Comment: rare  . Drug use: No  . Sexual activity: Yes    Birth control/ protection: Surgical   Other Topics Concern  . None   Social History Narrative   Patient is married [name]).   Patient has two children.   Patient is retired.   Patient has a college education.   Patient is right-handed.   Patient lives at home with family.   Caffeine Use: 2 soda every other day   Allergies  Allergen Reactions  . Aspirin   . Coconut Oil   . Latex   . Lisinopril     REACTION: cough  . Metoprolol Itching  . Peanut-Containing Drug Products   . Penicillins     REACTION: rash  . Prednisone   . Strawberry Extract    Family History  Problem Relation Age of Onset  . Asthma Mother   . Depression Mother   . Bipolar disorder Mother   . Dementia Mother   . Arthritis Mother   . Breast cancer Mother        primary  .  Colon cancer Mother        mets from breast  . Hypertension Father   . Migraines Father   . Cancer Maternal Aunt   . Heart disease Maternal Grandmother   . Cancer Maternal Grandfather      Past medical history, social, surgical and family history all reviewed in electronic medical record.  No pertanent information unless stated regarding to the chief complaint.   Review of Systems: No headache, visual changes, nausea, vomiting, diarrhea, constipation, dizziness, abdominal pain, skin rash, fevers, chills, night sweats, weight loss, swollen lymph nodes, body aches, joint swelling,  chest pain, shortness of breath, mood changes.  Positive muscle aches  Objective  Blood pressure 130/80, pulse 82, height 5' 4"  (1.626 m), weight 271 lb (122.9 kg), SpO2 96 %. Systems examined below as of  10/19/16   General: No apparent distress alert and oriented x3 mood and affect normal, dressed appropriately. Obese.  HEENT: Pupils equal, extraocular movements intact  Respiratory: Patient's speak in full sentences and does not appear short of breath  Cardiovascular: Trace lower extremity edema, non tender, no erythema  Skin: Warm dry intact with no signs of infection or rash on extremities or on axial skeleton.  Abdomen: Soft nontender  Neuro: Cranial nerves II through XII are intact, neurovascularly intact in all extremities with 2+ DTRs and 2+ pulses.  Lymph: No lymphadenopathy of posterior or anterior cervical chain or axillae bilaterally.  Gait antalgic gait  MSK:  Non tender with full range of motion and good stability and symmetric strength and tone of shoulders, elbows, wrist, hip, and ankles bilaterally.   Knee: Bilateral valgus deformity noted. Large thigh to calf ratio.  Tender to palpation over medial and PF joint line.  ROM full in flexion and extension and lower leg rotation. instability with valgus force.  painful patellar compression. Patellar glide with moderate crepitus. Patellar and quadriceps tendons unremarkable. Hamstring and quadriceps strength is normal.  After informed written and verbal consent, patient was seated on exam table. Right knee was prepped with alcohol swab and utilizing anterolateral approach, patient's right knee space was injected with 4:1  marcaine 0.5%: Kenalog 94m/dL. Patient tolerated the procedure well without immediate complications.  After informed written and verbal consent, patient was seated on exam table. Left knee was prepped with alcohol swab and utilizing anterolateral approach, patient's left knee space was injected with 4:1  marcaine 0.5%: Kenalog 48mdL. Patient tolerated the procedure well without immediate complications.   Impression and Recommendations:     This case required medical decision making of moderate  complexity.      Note: This dictation was prepared with Dragon dictation along with smaller phrase technology. Any transcriptional errors that result from this process are unintentional.

## 2016-10-19 NOTE — Patient Instructions (Addendum)
Good to see you  Heather [name] is your friend.  Gabapentin still at night B12 given today but for now continue the pills Tramadol 1 time daily on your trip  Have a great time  See me again in 3-4 weeks!

## 2016-10-19 NOTE — Assessment & Plan Note (Signed)
B12 injection today and see how she does, and may need to do IM supplements

## 2016-10-22 ENCOUNTER — Ambulatory Visit: Payer: Medicare Other | Admitting: Family Medicine

## 2016-10-26 ENCOUNTER — Ambulatory Visit: Payer: Medicare Other | Admitting: Family Medicine

## 2016-11-09 ENCOUNTER — Other Ambulatory Visit: Payer: Self-pay | Admitting: Endocrinology

## 2016-11-16 ENCOUNTER — Ambulatory Visit (INDEPENDENT_AMBULATORY_CARE_PROVIDER_SITE_OTHER): Payer: Medicare Other | Admitting: Family Medicine

## 2016-11-16 ENCOUNTER — Encounter: Payer: Self-pay | Admitting: Family Medicine

## 2016-11-16 DIAGNOSIS — I2510 Atherosclerotic heart disease of native coronary artery without angina pectoris: Secondary | ICD-10-CM

## 2016-11-16 DIAGNOSIS — M170 Bilateral primary osteoarthritis of knee: Secondary | ICD-10-CM

## 2016-11-16 MED ORDER — VENLAFAXINE HCL ER 37.5 MG PO CP24: 37.5000 mg | ORAL_CAPSULE | Freq: Every day | ORAL | 1 refills | Status: DC

## 2016-11-16 MED ORDER — TRAMADOL HCL 50 MG PO TABS: 50.0000 mg | ORAL_TABLET | Freq: Two times a day (BID) | ORAL | 0 refills | Status: DC | PRN

## 2016-11-16 NOTE — Assessment & Plan Note (Signed)
Patient has been referred to weight and wellness and is going to ask week

## 2016-11-16 NOTE — Progress Notes (Signed)
[name] [address], [place] [address] Phone: [phone] Subjective:    I'm seeing this patient by the request  of:    CC: Bilateral knee pain  [id number]  Heather [name] is a 71 y.o. female coming in with complaint of bilateral knee pain. Patient has bone-on-bone osteophytic changes of knees.Patient was given injection 1 month ago in the knees. Patient is failed all other conservative therapy. Was going on a trip and did have to take tramadol. Patient states doing much better overall.  Now starting to hurt a little more.      Past Medical History:  Diagnosis Date  . Abnormal Pap smear   . ALLERGIC RHINITIS 10/22/2006  . ANEMIA 12/18/2008  . ASYMPTOMATIC POSTMENOPAUSAL STATUS 11/22/2007  . Complication of anesthesia   . Degenerative arthritis   . DIABETES MELLITUS, TYPE II 01/13/2007  . Dyslipidemia   . Fibroid   . GERD 07/20/2007  . GOITER, MULTINODULAR 07/20/2007  . Headache(784.0) 07/20/2007  . HEARING LOSS 11/22/2007  . Hx of colposcopy with cervical biopsy   . HYPERCHOLESTEROLEMIA 01/13/2007  . Hyperglycemia   . HYPERTENSION 10/22/2006  . NASH (nonalcoholic steatohepatitis)   . Nocturnal hypoxemia 02/17/2013  . Obesity   . Obstructive sleep apnea   . OSTEOARTHRITIS 10/22/2006  . Other chronic nonalcoholic liver disease 0/10/2328   Past Surgical History:  Procedure Laterality Date  . BREAST SURGERY     Breast reduction  . CATARACT EXTRACTION, BILATERAL    . DEXA  08/2005  . DILATION AND CURETTAGE OF UTERUS    . ELECTROCARDIOGRAM  10/15/2006  . ESOPHAGOGASTRODUODENOSCOPY  12/08/2005  . Stress Cardiolite  10/21/2005  . sweat gland removal    . WISDOM TOOTH EXTRACTION     Social History   Social History  . Marital status: Married    Spouse name: [name]  . Number of children: 2  . Years of education: College   Occupational History  . Retired    Social History Main Topics  . Smoking status: Former Smoker   Packs/day: 2.00    Years: 20.00    Types: Cigarettes    Quit date: 03/03/1979  . Smokeless tobacco: Never Used  . Alcohol use Yes     Comment: rare  . Drug use: No  . Sexual activity: Yes    Birth control/ protection: Surgical   Other Topics Concern  . None   Social History Narrative   Patient is married [name]).   Patient has two children.   Patient is retired.   Patient has a college education.   Patient is right-handed.   Patient lives at home with family.   Caffeine Use: 2 soda every other day   Allergies  Allergen Reactions  . Aspirin   . Coconut Oil   . Latex   . Lisinopril     REACTION: cough  . Metoprolol Itching  . Peanut-Containing Drug Products   . Penicillins     REACTION: rash  . Prednisone   . Strawberry Extract    Family History  Problem Relation Age of Onset  . Asthma Mother   . Depression Mother   . Bipolar disorder Mother   . Dementia Mother   . Arthritis Mother   . Breast cancer Mother        primary  . Colon cancer Mother        mets from breast  . Hypertension Father   . Migraines Father   . Cancer  Maternal Aunt   . Heart disease Maternal Grandmother   . Cancer Maternal Grandfather      Past medical history, social, surgical and family history all reviewed in electronic medical record.  No pertanent information unless stated regarding to the chief complaint.   Review of Systems: No headache, visual changes, nausea, vomiting, diarrhea, constipation, dizziness, abdominal pain, skin rash, fevers, chills, night sweats, weight loss, swollen lymph nodes, body aches, joint swelling,  chest pain, shortness of breath, mood changes.  Positive muscle aches  Objective  Blood pressure 112/72, pulse 89, weight 266 lb (120.7 kg), SpO2 93 %.   Systems examined below as of 11/16/16 General: NAD A&O x3 mood, affect normal Morbidly obese HEENT: Pupils equal, extraocular movements intact no nystagmus Respiratory: not short of breath at rest or with  speaking Cardiovascular: No lower extremity edema, non tender Skin: Warm dry intact with no signs of infection or rash on extremities or on axial skeleton. Abdomen: Soft nontender, no masses Neuro: Cranial nerves  intact, neurovascularly intact in all extremities with 2+ DTRs and 2+ pulses. Lymph: No lymphadenopathy appreciated today  Gait antalgic but improved, using a cain  MSK: Non tender with full range of motion and good stability and symmetric strength and tone of shoulders, elbows, wrist,  hips and ankles bilaterally.  Moderate changes of multiple joints Knee: Bilateral valgus deformity noted. Large thigh to calf ratio.  Tender to palpation over medial and PF joint line. Less than previous exam ROM full in flexion and extension and lower leg rotation. instability with valgus force.  painful patellar compression. Patellar glide with severe crepitus. Patellar and quadriceps tendons unremarkable. Hamstring and quadriceps strength is normal.   Impression and Recommendations:     This case required medical decision making of moderate complexity.      Note: This dictation was prepared with Dragon dictation along with smaller phrase technology. Any transcriptional errors that result from this process are unintentional.

## 2016-11-16 NOTE — Assessment & Plan Note (Signed)
Patient seems to be doing relatively well though not to the bilateral injections. We discussed the possibility of repeating viscous supplementation which patient was: At this time. Patient continue all other medications that we will start the Effexor for the potential for more of a polyarthralgia the could be contribute in.atient come back in 4 weeks and we'll see how patient is responding

## 2016-11-16 NOTE — Patient Instructions (Signed)
I so glad you had a great trip  Ice is your friend Effexor 37.5 mg daily  Tramadol as needed at night Keep doing hat you are doing.  See me again In 5 weeks.

## 2016-11-24 ENCOUNTER — Encounter (INDEPENDENT_AMBULATORY_CARE_PROVIDER_SITE_OTHER): Payer: Medicare Other

## 2016-11-25 ENCOUNTER — Encounter: Payer: Self-pay | Admitting: Family Medicine

## 2016-11-27 ENCOUNTER — Encounter: Payer: Self-pay | Admitting: Family Medicine

## 2016-12-08 ENCOUNTER — Other Ambulatory Visit: Payer: Self-pay | Admitting: Endocrinology

## 2016-12-22 ENCOUNTER — Encounter: Payer: Self-pay | Admitting: Family Medicine

## 2016-12-22 ENCOUNTER — Ambulatory Visit (INDEPENDENT_AMBULATORY_CARE_PROVIDER_SITE_OTHER): Payer: Medicare Other | Admitting: Family Medicine

## 2016-12-22 DIAGNOSIS — M5136 Other intervertebral disc degeneration, lumbar region: Secondary | ICD-10-CM | POA: Diagnosis not present

## 2016-12-22 DIAGNOSIS — I2510 Atherosclerotic heart disease of native coronary artery without angina pectoris: Secondary | ICD-10-CM | POA: Diagnosis not present

## 2016-12-22 DIAGNOSIS — M170 Bilateral primary osteoarthritis of knee: Secondary | ICD-10-CM

## 2016-12-22 NOTE — Progress Notes (Signed)
[name] [name] [place], [name] [address] Phone: [phone] Subjective:    I'm seeing this patient by the request  of:    CC: Low back pain, bilateral knee pain follow-up  OXB:[id number]  Heather [name] is a 71 y.o. 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 Patient has been doing relatively well with conservative therapy. Feels that her knees are worsening and because of this is causing more discomfort and pain. Patient states that there some mild radiation down the legs. We did start her on Effexor but patient did not take it regularly. Patient is looking forward bone to her health and wellness appointment that she has tomorrow. Feels that she gets weight loss most of her pains when improved.  Bilateral knee pain-severe osteophytic changes of the knees bilaterally. Has been doing relatively well with recurrent steroid injections. Last ones to months ago. Started having increasing discomfort again. Affecting daily activities. Not doing the exercises regularly at this time.      Past Medical History:  Diagnosis Date  . Abnormal Pap smear   . ALLERGIC RHINITIS 10/22/2006  . ANEMIA 12/18/2008  . ASYMPTOMATIC POSTMENOPAUSAL STATUS 11/22/2007  . Complication of anesthesia   . Degenerative arthritis   . DIABETES MELLITUS, TYPE II 01/13/2007  . Dyslipidemia   . Fibroid   . GERD 07/20/2007  . GOITER, MULTINODULAR 07/20/2007  . Headache(784.0) 07/20/2007  . HEARING LOSS 11/22/2007  . Hx of colposcopy with cervical biopsy   . HYPERCHOLESTEROLEMIA 01/13/2007  . Hyperglycemia   . HYPERTENSION 10/22/2006  . NASH (nonalcoholic steatohepatitis)   . Nocturnal hypoxemia 02/17/2013  . Obesity   . Obstructive sleep apnea   . OSTEOARTHRITIS 10/22/2006  . 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 BREAST SURGERY     Breast  reduction  . CATARACT EXTRACTION, BILATERAL    . DEXA  08/2005  . DILATION AND CURETTAGE OF UTERUS    . ELECTROCARDIOGRAM  10/15/2006  . ESOPHAGOGASTRODUODENOSCOPY  12/08/2005  . Stress Cardiolite  10/21/2005  . sweat gland removal    . WISDOM TOOTH EXTRACTION     Social History   Social History  . Marital status: Married    Spouse name: Heather [name]  . Number of children: 2  . Years of education: College   Occupational History  . Retired    Social History Main Topics  . Smoking status: Former Smoker    Packs/day: 2.00    Years: 20.00    Types: Cigarettes    Quit date: 03/03/1979  . Smokeless tobacco: Never Used  . Alcohol use Yes     Comment: rare  . Drug use: No  . Sexual activity: Yes    Birth control/ protection: Surgical   Other Topics Concern  . None   Social History Narrative   Patient is married Heather [name]).   Patient has two children.   Patient is retired.   Patient has a college education.   Patient is right-handed.   Patient lives at home with family.   Caffeine Use: 2 soda every other day   Allergies  Allergen Reactions  . Aspirin   . Coconut Oil   . Latex   . Lisinopril     REACTION: cough  . Metoprolol Itching  . Peanut-Containing Drug Products   . Penicillins     REACTION: rash  . Prednisone   .  Strawberry Extract    Family History  Problem Relation Age of Onset  . Asthma Mother   . Depression Mother   . Bipolar disorder Mother   . Dementia Mother   . Arthritis Mother   . Breast cancer Mother        primary  . Colon cancer Mother        mets from breast  . Hypertension Father   . Migraines Father   . Cancer Maternal Aunt   . Heart disease Maternal Grandmother   . Cancer Maternal Grandfather      Past medical history, social, surgical and family history all reviewed in electronic medical record.  No pertanent information unless stated regarding to the chief complaint.   Review of Systems:Review of systems updated and as accurate as  of 12/22/16  No headache, visual changes, nausea, vomiting, diarrhea, constipation, dizziness, abdominal pain, skin rash, fevers, chills, night sweats, weight loss, swollen lymph nodes,joint swelling,  chest pain, shortness of breath, mood changes. Positive muscle aches, body aches,  Objective  Blood pressure 100/80, pulse 84, height 5' 4"  (1.626 m), weight 271 lb (122.9 kg), SpO2 93 %. Systems examined below as of 12/22/16   General: No apparent distress alert and oriented x3 mood and affect normal, dressed appropriately.  HEENT: Pupils equal, extraocular movements intact  Respiratory: Patient's speak in full sentences and does not appear short of breath  Cardiovascular: 2+lower extremity edema, non tender, no erythema  Skin: Warm dry intact with no signs of infection or rash on extremities or on axial skeleton.  Abdomen: Soft nontender Obese Neuro: Cranial nerves II through XII are intact, neurovascularly intact in all extremities with 2+ DTRs and 2+ pulses.  Lymph: No lymphadenopathy of posterior or anterior cervical chain or axillae bilaterally.  Gait antalgic gait MSK:  Non tender with full range of motion and good stability and symmetric strength and tone of shoulders, elbows, wrist, hip, and ankles bilaterally. Arthritic changes of multiple joints Knee: Bilateral valgus deformity noted. Large thigh to calf ratio.  Tender to palpation over medial and PF joint line.  ROM full in flexion and extension and lower leg rotation. instability with valgus force.  painful patellar compression. Patellar glide with moderate crepitus. Patellar and quadriceps tendons unremarkable. Hamstring and quadriceps strength is normal.  After informed written and verbal consent, patient was seated on exam table. Right knee was prepped with alcohol swab and utilizing anterolateral approach, patient's right knee space was injected with 4:1  marcaine 0.5%: Kenalog 52m/dL. Patient tolerated the procedure well  without immediate complications.  After informed written and verbal consent, patient was seated on exam table. Left knee was prepped with alcohol swab and utilizing anterolateral approach, patient's left knee space was injected with 4:1  marcaine 0.5%: Kenalog 439mdL. Patient tolerated the procedure well without immediate complications.      Impression and Recommendations:     This case required medical decision making of moderate complexity.      Note: This dictation was prepared with Dragon dictation along with smaller phrase technology. Any transcriptional errors that result from this process are unintentional.

## 2016-12-22 NOTE — Patient Instructions (Signed)
Good to see you  Heather [name] is your friend.  Stay active.  Try the effexor a little more regularly.  Tried the injection again in the knees and consider orthovisc or monovisc next time.  See me again in 4 weeks.

## 2016-12-22 NOTE — Assessment & Plan Note (Signed)
Encourage her to take the Effexor more regular basis. We discussed the possibility of epidurals if needed.

## 2016-12-22 NOTE — Assessment & Plan Note (Addendum)
Bilateral injections given again today. We discussed icing regimen and home exercises. Patient will continue with the topical anti-inflammatory's. We discussed the possibility of viscous supplementation. Follow-up again in 4-6 weeks. Worsening gaze consider the viscous supplementation

## 2016-12-23 ENCOUNTER — Encounter (INDEPENDENT_AMBULATORY_CARE_PROVIDER_SITE_OTHER): Payer: Self-pay | Admitting: Family Medicine

## 2016-12-23 ENCOUNTER — Ambulatory Visit (INDEPENDENT_AMBULATORY_CARE_PROVIDER_SITE_OTHER): Payer: Medicare Other | Admitting: Family Medicine

## 2016-12-23 VITALS — BP 117/73 | HR 78 | Temp 98.0°F | Ht 65.0 in | Wt 268.0 lb

## 2016-12-23 DIAGNOSIS — E538 Deficiency of other specified B group vitamins: Secondary | ICD-10-CM | POA: Diagnosis not present

## 2016-12-23 DIAGNOSIS — Z1331 Encounter for screening for depression: Secondary | ICD-10-CM | POA: Diagnosis not present

## 2016-12-23 DIAGNOSIS — E119 Type 2 diabetes mellitus without complications: Secondary | ICD-10-CM | POA: Diagnosis not present

## 2016-12-23 DIAGNOSIS — Z6841 Body Mass Index (BMI) 40.0 and over, adult: Secondary | ICD-10-CM

## 2016-12-23 DIAGNOSIS — D508 Other iron deficiency anemias: Secondary | ICD-10-CM

## 2016-12-23 DIAGNOSIS — R5383 Other fatigue: Secondary | ICD-10-CM

## 2016-12-23 DIAGNOSIS — E559 Vitamin D deficiency, unspecified: Secondary | ICD-10-CM

## 2016-12-23 DIAGNOSIS — Z0289 Encounter for other administrative examinations: Secondary | ICD-10-CM

## 2016-12-23 DIAGNOSIS — R0602 Shortness of breath: Secondary | ICD-10-CM

## 2016-12-23 NOTE — Progress Notes (Signed)
.  Office: [phone]  /  Fax: [phone]   HPI:   Chief Complaint: OBESITY  Heather [name] (MR# [id number]) is a 71 y.o. female who presents on 12/23/2016 for obesity evaluation and treatment. Current BMI is Body mass index is 44.6 kg/m.Heather [name] Heather [name] has struggled with obesity for years and has been unsuccessful in either losing weight or maintaining long term weight loss. Heather [name] attended our information session and states she is currently in the action stage of change and ready to dedicate time achieving and maintaining a healthier weight.   Heather [name] states her family eats meals together she thinks her family will eat healthier with  her she struggles with family and or coworkers weight loss sabotage her desired weight loss is 88 lbs she has been heavy most of  her life she started gaining weight at 71 years old her heaviest weight ever was 292 lbs she has significant food cravings issues  she skips meals frequently she is frequently drinking liquids with calories she frequently makes poor food choices she frequently eats larger portions than normal  she struggles with emotional eating    Fatigue Heather [name] feels her energy is lower than it should be. This has worsened with weight gain and has not worsened recently. Heather [name] admits to daytime somnolence and  admits to waking up still tired. Patient is at risk for obstructive sleep apnea. Patent has a history of symptoms of daytime fatigue. Patient generally gets 5 hours of sleep per night, and states they generally have nightime awakenings. Snoring is not present. Apneic episodes were present before bipap. Epworth Sleepiness Score is 21.  Dyspnea on exertion Heather [name] notes increasing shortness of breath with exercising and seems to be worsening over time with weight gain. She notes getting out of breath sooner with activity than she used to. This has not gotten worse recently. Heather [name] denies orthopnea.  Diabetes II Heather [name] has a diagnosis of diabetes  type II. Heather [name] is on metformin and she states she is not checking BGs at home and denies any hypoglycemic episodes. Last A1c was 6.3 on 09/08/16. She has been working on intensive lifestyle modifications including diet, exercise, and weight loss to help control her blood glucose levels.  Vitamin D deficiency Heather [name] has a diagnosis of vitamin D deficiency. She is on calcium plus Vit D and she notes fatigue but denies nausea, vomiting or muscle weakness.  Vitamin B12 Deficiency  Heather [name] has a diagnosis of B12 insufficiency and notes fatigue. This is not a new diagnosis. She is on OTC vitamin B12.Heather [name] is not a vegetarian and does not have a previous diagnosis of pernicious anemia. She does not have a history of weight loss surgery.   Iron Deficiency Anemia [name] has a diagnosis of iron deficiency anemia.  She notes fatigue and is on iron supplementation.    Depression Screen Heather [name]'s Food and Mood (modified PHQ-9) score was  Depression screen PHQ 2/9 12/23/2016  Decreased Interest 2  Down, Depressed, Hopeless 1  PHQ - 2 Score 3  Altered sleeping 1  Tired, decreased energy 3  Change in appetite 1  Feeling bad or failure about yourself  0  Trouble concentrating 0  Moving slowly or fidgety/restless 2  Suicidal thoughts 0  PHQ-9 Score 10  Difficult doing work/chores Somewhat difficult    ALLERGIES: Allergies  Allergen Reactions  . Aspirin   . Coconut Oil   . Latex   . Lisinopril     REACTION: cough  . Metoprolol Itching  . Peanut-Containing  Drug Products   . Penicillins     REACTION: rash  . Prednisone   . Strawberry Extract     MEDICATIONS: Current Outpatient Prescriptions on File Prior to Visit  Medication Sig Dispense Refill  . acetaminophen (TYLENOL) 500 MG tablet Take 650 mg by mouth 3 (three) times daily as needed for pain.     Heather [name] ALLEGRA-D ALLERGY & CONGESTION 180-240 MG 24 hr tablet TAKE 1 TABLET BY MOUTH DAILY 30 tablet 0  . Calcium Carbonate-Vitamin D (CALCIUM 600 + D  PO) Take 2 tablets by mouth daily.      Heather [name] CALCIUM PO Take by mouth. Plus Vitamin D    . clopidogrel (PLAVIX) 75 MG tablet Take 1 tablet (75 mg total) by mouth daily. 90 tablet 3  . cyanocobalamin 1000 MCG tablet Take 1,000 mcg by mouth daily.    . Ferrous Sulfate (IRON) 325 (65 FE) MG TABS Take 1 tablet by mouth daily.      . furosemide (LASIX) 40 MG tablet TAKE 1 TABLET EVERY DAY 90 tablet 3  . gabapentin (NEURONTIN) 100 MG capsule TAKE 2 CAPSULES(200 MG) BY MOUTH AT BEDTIME 180 capsule 1  . glucosamine-chondroitin 500-400 MG tablet Take 1 tablet by mouth 2 (two) times daily.      Heather [name] losartan (COZAAR) 25 MG tablet TAKE 1 TABLET EVERY DAY 90 tablet 3  . metFORMIN (GLUCOPHAGE-XR) 500 MG 24 hr tablet TAKE 2 TABLETS TWICE DAILY 360 tablet 3  . modafinil (PROVIGIL) 200 MG tablet Take 1 tablet (200 mg total) by mouth daily. 30 tablet 1  . Multiple Vitamin (MULTIVITAMIN) tablet Take 1 tablet by mouth daily.      . naproxen sodium (ANAPROX) 220 MG tablet Take 220 mg by mouth as needed.    Heather [name] omeprazole (PRILOSEC) 20 MG capsule Take 20 mg by mouth daily as needed.     . potassium chloride SA (K-DUR,KLOR-CON) 20 MEQ tablet TAKE 1 TABLET EVERY DAY 90 tablet 3  . pseudoephedrine-acetaminophen (TYLENOL SINUS) 30-500 MG TABS tablet Take 1 tablet by mouth as needed.    . pyridoxine (B-6) 200 MG tablet Take 200 mg by mouth daily.    . rosuvastatin (CRESTOR) 40 MG tablet TAKE 1 TABLET EVERY DAY 90 tablet 3  . SF 5000 PLUS 1.1 % CREA dental cream   0  . SUMAtriptan (IMITREX) 20 MG/ACT nasal spray One spray (20 mg) in the nostril at the onset of migraine.May repeat in 2 hours if needed. No more than 2 sprays in a 24 hour period 12 Inhaler 1  . topiramate (TOPAMAX) 100 MG tablet Take 1 tablet (100 mg total) by mouth daily. 90 tablet 3  . traMADol (ULTRAM) 50 MG tablet Take 1 tablet (50 mg total) by mouth every 12 (twelve) hours as needed. 60 tablet 0  . venlafaxine XR (EFFEXOR XR) 37.5 MG 24 hr capsule Take 1  capsule (37.5 mg total) by mouth daily with breakfast. 30 capsule 1  . vitamin E 200 UNIT capsule Take 200 Units by mouth daily.     Current Facility-Administered Medications on File Prior to Visit  Medication Dose Route Frequency Provider Last Rate Last Dose  . 0.9 %  sodium chloride infusion  500 mL Intravenous Continuous Nandigam, Venia Minks, MD        PAST MEDICAL HISTORY: Past Medical History:  Diagnosis Date  . Abnormal Pap smear   . ALLERGIC RHINITIS 10/22/2006  . ANEMIA 12/18/2008  . Arthritis   . ASYMPTOMATIC POSTMENOPAUSAL STATUS 11/22/2007  .  Back pain   . Complication of anesthesia   . Degenerative arthritis   . DIABETES MELLITUS, TYPE II 01/13/2007  . Dyslipidemia   . Fibroid   . GERD 07/20/2007  . GOITER, MULTINODULAR 07/20/2007  . Headache(784.0) 07/20/2007  . HEARING LOSS 11/22/2007  . Hiatal hernia   . Hx of colposcopy with cervical biopsy   . HYPERCHOLESTEROLEMIA 01/13/2007  . Hyperglycemia   . HYPERTENSION 10/22/2006  . NASH (nonalcoholic steatohepatitis)   . Nocturnal hypoxemia 02/17/2013  . Obesity   . Obstructive sleep apnea   . OSTEOARTHRITIS 10/22/2006  . Other chronic nonalcoholic liver disease 4/85/4627  . Sleep apnea     PAST SURGICAL HISTORY: Past Surgical History:  Procedure Laterality Date  . BREAST SURGERY     Breast reduction  . CATARACT EXTRACTION, BILATERAL    . DEXA  08/2005  . DILATION AND CURETTAGE OF UTERUS    . ELECTROCARDIOGRAM  10/15/2006  . ESOPHAGOGASTRODUODENOSCOPY  12/08/2005  . Stress Cardiolite  10/21/2005  . sweat gland removal    . WISDOM TOOTH EXTRACTION      SOCIAL HISTORY: Social History  Substance Use Topics  . Smoking status: Former Smoker    Packs/day: 2.00    Years: 20.00    Types: Cigarettes    Quit date: 03/03/1979  . Smokeless tobacco: Never Used  . Alcohol use Yes     Comment: rare    FAMILY HISTORY: Family History  Problem Relation Age of Onset  . Asthma Mother   . Depression Mother   . Bipolar  disorder Mother   . Dementia Mother   . Arthritis Mother   . Breast cancer Mother        primary  . Colon cancer Mother        mets from breast  . Cancer Mother   . Hypertension Father   . Migraines Father   . Cancer Maternal Aunt   . Heart disease Maternal Grandmother   . Cancer Maternal Grandfather     ROS: Review of Systems  Constitutional: Positive for malaise/fatigue. Negative for weight loss.  HENT:       Decreased hearing Dry mouth  Eyes:       Wear glasses or contacts Floaters  Respiratory: Positive for shortness of breath (with exertion).   Cardiovascular: Negative for orthopnea.       Leg cramping Very cold feet or hands  Gastrointestinal: Positive for heartburn. Negative for nausea and vomiting.  Musculoskeletal: Positive for back pain.       Negative muscle weakness Muscle or joint pain  Skin:       Dryness  Hair or nail changes   Endo/Heme/Allergies:       Negative hypoglycemia  Psychiatric/Behavioral: Positive for depression. Negative for suicidal ideas.    PHYSICAL EXAM: Blood pressure 117/73, pulse 78, temperature 98 F (36.7 C), temperature source Oral, height 5' 5"  (1.651 m), weight 268 lb (121.6 kg), SpO2 98 %. Body mass index is 44.6 kg/m. Physical Exam  Constitutional: She is oriented to person, place, and time. She appears well-developed and well-nourished.  Cardiovascular: Normal rate.   Pulmonary/Chest: Effort normal.  Musculoskeletal: Normal range of motion.  Neurological: She is oriented to person, place, and time.  Skin: Skin is warm and dry.  Psychiatric: She has a normal mood and affect. Her behavior is normal.  Vitals reviewed.   RECENT LABS AND TESTS: BMET    Component Value Date/Time   NA 140 03/05/2016 0958   K 3.5 03/05/2016  0958   CL 102 03/05/2016 0958   CO2 33 (H) 03/05/2016 0958   GLUCOSE 116 (H) 03/05/2016 0958   BUN 15 03/05/2016 0958   CREATININE 0.87 03/05/2016 0958   CREATININE 0.81 12/28/2011 0853    CALCIUM 9.4 03/05/2016 0958   GFRNONAA 94.49 12/13/2009 0908   GFRAA 94 11/11/2007 0912   Lab Results  Component Value Date   HGBA1C 6.3 09/08/2016   No results found for: INSULIN CBC    Component Value Date/Time   WBC 7.2 03/05/2016 0958   RBC 4.46 03/05/2016 0958   HGB 12.8 03/05/2016 0958   HCT 38.8 03/05/2016 0958   PLT 300.0 03/05/2016 0958   MCV 87.1 03/05/2016 0958   MCH 29.4 12/28/2011 0853   MCHC 32.9 03/05/2016 0958   RDW 18.1 (H) 03/05/2016 0958   LYMPHSABS 1.5 03/05/2016 0958   MONOABS 0.4 03/05/2016 0958   EOSABS 0.1 03/05/2016 0958   BASOSABS 0.0 03/05/2016 0958   Iron/TIBC/Ferritin/ %Sat    Component Value Date/Time   IRON 35 02/12/2016 1135   TIBC 265 02/12/2016 1135   FERRITIN 61 02/12/2016 1135   IRONPCTSAT 13 (L) 02/12/2016 1135   Lipid Panel     Component Value Date/Time   CHOL 170 03/05/2016 0958   TRIG 130.0 03/05/2016 0958   HDL 65.50 03/05/2016 0958   CHOLHDL 3 03/05/2016 0958   VLDL 26.0 03/05/2016 0958   LDLCALC 78 03/05/2016 0958   Hepatic Function Panel     Component Value Date/Time   PROT 8.0 02/15/2015 1010   ALBUMIN 3.7 02/15/2015 1010   AST 19 02/15/2015 1010   ALT 14 02/15/2015 1010   ALKPHOS 67 02/15/2015 1010   BILITOT 0.3 02/15/2015 1010   BILIDIR 0.1 02/15/2015 1010   IBILI 0.2 12/28/2011 0853      Component Value Date/Time   TSH 1.76 03/05/2016 0958   TSH 1.86 02/15/2015 1010   TSH 1.26 08/01/2014 1138    ECG  shows NSR with a rate of 75 BPM INDIRECT CALORIMETER done today shows a VO2 of 283 and a REE of 1971. Her calculated basal metabolic rate is 3244 thus her basal metabolic rate is better than expected.    ASSESSMENT AND PLAN: Other fatigue - Plan: EKG 12-Lead, Lipid Panel With LDL/HDL Ratio, T3, T4, free, TSH  Shortness of breath on exertion - Plan: Lipid Panel With LDL/HDL Ratio  Type 2 diabetes mellitus without complication, without long-term current use of insulin (HCC) - Plan: Comprehensive  metabolic panel, Hemoglobin A1c, Insulin, random, Microalbumin / creatinine urine ratio  Vitamin D deficiency - Plan: VITAMIN D 25 Hydroxy (Vit-D Deficiency, Fractures)  B12 nutritional deficiency - Plan: Vitamin B12  Other iron deficiency anemia - Plan: CBC With Differential, Anemia panel  Depression screening  Class 3 severe obesity with serious comorbidity and body mass index (BMI) of 40.0 to 44.9 in adult, unspecified obesity type (HCC)  PLAN:  Fatigue Brigit was informed that her fatigue may be related to obesity, depression or many other causes. Labs will be ordered, and in the meanwhile Bambie has agreed to work on diet, exercise and weight loss to help with fatigue. Proper sleep hygiene was discussed including the need for 7-8 hours of quality sleep each night. A sleep study was not ordered based on symptoms and Epworth score.  Dyspnea on exertion Kaelani's shortness of breath appears to be obesity related and exercise induced. She has agreed to work on weight loss and gradually increase exercise to treat her exercise  induced shortness of breath. If Florette follows our instructions and loses weight without improvement of her shortness of breath, we will plan to refer to pulmonology. We will monitor this condition regularly. Yides agrees to this plan.  Diabetes II Mikael has been given extensive diabetes education by myself today including ideal fasting and post-prandial blood glucose readings, individual ideal Hgb A1c goals and hypoglycemia prevention. We discussed the importance of good blood sugar control to decrease the likelihood of diabetic complications such as nephropathy, neuropathy, limb loss, blindness, coronary artery disease, and death. We discussed the importance of intensive lifestyle modification including diet, exercise and weight loss as the first line treatment for diabetes. Tambi agrees to continue taking metformin and will follow up with our clinic in 2 weeks.  Vitamin D  Deficiency Debora was informed that low vitamin D levels contributes to fatigue and are associated with obesity, breast, and colon cancer. She agrees to continue taking calcium plus Vit D and will follow up for routine testing of vitamin D, at least 2-3 times per year. She was informed of the risk of over-replacement of vitamin D and agrees to not increase her dose unless he discusses this with [name] first. We will check labs and [place] agrees to follow up with our clinic in 2 weeks.  Vitamin B12 Deficiency Ilse will work on increasing B12 rich foods in her diet. B12 supplementation was not prescribed today. We will check labs and Shelina agrees to follow up with our clinic in 2 weeks.  Iron Deficiency Anemia The diagnosis of Iron deficiency anemia was discussed with Isley and was explained in detail. She was given suggestions of iron rich foods and and iron supplement was not prescribed. We will check labs and Tonjia agrees to follow up with our clinic in 2 weeks.  Depression Screen Taiyana had a moderately positive depression screening. Depression is commonly associated with obesity and often results in emotional eating behaviors. We will monitor this closely and work on CBT to help improve the non-hunger eating patterns. Referral to Psychology may be required if no improvement is seen as she continues in our clinic.  Obesity Jacqulyne is currently in the action stage of change and her goal is to continue with weight loss efforts She has agreed to follow the Category 2 plan + 100 calories Remingtyn has been instructed to work up to a goal of 150 minutes of combined cardio and strengthening exercise per week for weight loss and overall health benefits. We discussed the following Behavioral Modification Strategies today: increasing lean protein intake, decreasing simple carbohydrates, and work on meal planning and easy cooking plans  Yasmen has agreed to follow up with our clinic in 2 weeks. She was informed of the  importance of frequent follow up visits to maximize her success with intensive lifestyle modifications for her multiple health conditions. She was informed we would discuss her lab results at her next visit unless there is a critical issue that needs to be addressed sooner. Adylee agreed to keep her next visit at the agreed upon time to discuss these results.  I, [name], am acting as transcriptionist for [name], MD  I have reviewed the above documentation for accuracy and completeness, and I agree with the above. -[name], MD        Today's visit was # 1 out of 22.  Starting weight: 268 lbs Starting date: 12/23/16 Today's weight : 268 lbs  Today's date: 12/23/2016 Total lbs lost to date: 0 (Patients  must lose 7 lbs in the first 6 months to continue with counseling)   ASK: We discussed the diagnosis of obesity with Cherlynn June today and Shakala agreed to give [name] permission to discuss obesity behavioral modification therapy today.  ASSESS: Anniemae has the diagnosis of obesity and her BMI today is 44.6 Fariha is in the action stage of change   ADVISE: Tamika was educated on the multiple health risks of obesity as well as the benefit of weight loss to improve her health. She was advised of the need for long term treatment and the importance of lifestyle modifications.  AGREE: Multiple dietary modification options and treatment options were discussed and  Latroya agreed to follow the Category 2 plan + 100 calories We discussed the following Behavioral Modification Strategies today: increasing lean protein intake, decreasing simple carbohydrates, and work on meal planning and easy cooking plans

## 2016-12-24 ENCOUNTER — Encounter (INDEPENDENT_AMBULATORY_CARE_PROVIDER_SITE_OTHER): Payer: Self-pay | Admitting: Family Medicine

## 2016-12-24 LAB — CBC WITH DIFFERENTIAL
BASOS ABS: 0 10*3/uL (ref 0.0–0.2)
Basos: 0 %
EOS (ABSOLUTE): 0.1 10*3/uL (ref 0.0–0.4)
Eos: 2 %
Hemoglobin: 12.1 g/dL (ref 11.1–15.9)
Immature Grans (Abs): 0 10*3/uL (ref 0.0–0.1)
Immature Granulocytes: 0 %
LYMPHS ABS: 1.2 10*3/uL (ref 0.7–3.1)
Lymphs: 24 %
MCH: 28 pg (ref 26.6–33.0)
MCHC: 32.4 g/dL (ref 31.5–35.7)
MCV: 87 fL (ref 79–97)
Monocytes Absolute: 0.3 10*3/uL (ref 0.1–0.9)
Monocytes: 7 %
NEUTROS ABS: 3.3 10*3/uL (ref 1.4–7.0)
Neutrophils: 67 %
RBC: 4.32 x10E6/uL (ref 3.77–5.28)
RDW: 17.5 % — AB (ref 12.3–15.4)
WBC: 4.9 10*3/uL (ref 3.4–10.8)

## 2016-12-24 LAB — MICROALBUMIN / CREATININE URINE RATIO
Creatinine, Urine: 32.4 mg/dL
Microalb/Creat Ratio: 40.1 mg/g creat — ABNORMAL HIGH (ref 0.0–30.0)
Microalbumin, Urine: 13 ug/mL

## 2016-12-24 LAB — COMPREHENSIVE METABOLIC PANEL
ALK PHOS: 71 IU/L (ref 39–117)
ALT: 13 IU/L (ref 0–32)
AST: 17 IU/L (ref 0–40)
Albumin/Globulin Ratio: 1.2 (ref 1.2–2.2)
Albumin: 4.1 g/dL (ref 3.5–4.8)
BUN/Creatinine Ratio: 18 (ref 12–28)
BUN: 13 mg/dL (ref 8–27)
CHLORIDE: 102 mmol/L (ref 96–106)
CO2: 30 mmol/L — ABNORMAL HIGH (ref 20–29)
Calcium: 9.3 mg/dL (ref 8.7–10.3)
Creatinine, Ser: 0.74 mg/dL (ref 0.57–1.00)
GFR calc Af Amer: 95 mL/min/{1.73_m2} (ref >59)
GFR calc non Af Amer: 82 mL/min/{1.73_m2} (ref >59)
GLUCOSE: 89 mg/dL (ref 65–99)
Globulin, Total: 3.3 g/dL (ref 1.5–4.5)
Potassium: 3.6 mmol/L (ref 3.5–5.2)
Sodium: 142 mmol/L (ref 134–144)
Total Protein: 7.4 g/dL (ref 6.0–8.5)

## 2016-12-24 LAB — LIPID PANEL WITH LDL/HDL RATIO
CHOLESTEROL TOTAL: 178 mg/dL (ref 100–199)
HDL: 81 mg/dL (ref >39)
LDL Calculated: 81 mg/dL (ref 0–99)
LDL/HDL RATIO: 1 ratio (ref 0.0–3.2)
TRIGLYCERIDES: 80 mg/dL (ref 0–149)
VLDL Cholesterol Cal: 16 mg/dL (ref 5–40)

## 2016-12-24 LAB — T4, FREE: FREE T4: 1.07 ng/dL (ref 0.82–1.77)

## 2016-12-24 LAB — ANEMIA PANEL
Ferritin: 40 ng/mL (ref 15–150)
Folate, Hemolysate: 531.7 ng/mL
Folate, RBC: 1422 ng/mL (ref >498)
HEMATOCRIT: 37.4 % (ref 34.0–46.6)
IRON: 38 ug/dL (ref 27–139)
Iron Saturation: 14 % — ABNORMAL LOW (ref 15–55)
Retic Ct Pct: 1.3 % (ref 0.6–2.6)
TIBC: 271 ug/dL (ref 250–450)
UIBC: 233 ug/dL (ref 118–369)
Vitamin B-12: >2000 pg/mL — ABNORMAL HIGH (ref 232–1245)

## 2016-12-24 LAB — T3: T3 TOTAL: 110 ng/dL (ref 71–180)

## 2016-12-24 LAB — HEMOGLOBIN A1C
ESTIMATED AVERAGE GLUCOSE: 134 mg/dL
HEMOGLOBIN A1C: 6.3 % — AB (ref 4.8–5.6)

## 2016-12-24 LAB — INSULIN, RANDOM: INSULIN: 20.9 u[IU]/mL (ref 2.6–24.9)

## 2016-12-24 LAB — VITAMIN D 25 HYDROXY (VIT D DEFICIENCY, FRACTURES): Vit D, 25-Hydroxy: 37.8 ng/mL (ref 30.0–100.0)

## 2016-12-24 LAB — TSH: TSH: 1.17 u[IU]/mL (ref 0.450–4.500)

## 2016-12-25 ENCOUNTER — Encounter (INDEPENDENT_AMBULATORY_CARE_PROVIDER_SITE_OTHER): Payer: Self-pay | Admitting: Family Medicine

## 2016-12-28 ENCOUNTER — Other Ambulatory Visit: Payer: Self-pay | Admitting: Family Medicine

## 2016-12-28 ENCOUNTER — Other Ambulatory Visit: Payer: Self-pay | Admitting: Endocrinology

## 2016-12-29 ENCOUNTER — Ambulatory Visit (INDEPENDENT_AMBULATORY_CARE_PROVIDER_SITE_OTHER): Payer: Medicare Other

## 2016-12-29 DIAGNOSIS — Z23 Encounter for immunization: Secondary | ICD-10-CM

## 2016-12-29 NOTE — Telephone Encounter (Signed)
Refill done.  

## 2017-01-05 ENCOUNTER — Other Ambulatory Visit (INDEPENDENT_AMBULATORY_CARE_PROVIDER_SITE_OTHER): Payer: Self-pay | Admitting: Family Medicine

## 2017-01-05 ENCOUNTER — Ambulatory Visit (INDEPENDENT_AMBULATORY_CARE_PROVIDER_SITE_OTHER): Payer: Medicare Other | Admitting: Family Medicine

## 2017-01-05 VITALS — BP 104/68 | HR 68 | Temp 98.0°F | Ht 65.0 in | Wt 256.0 lb

## 2017-01-05 DIAGNOSIS — R252 Cramp and spasm: Secondary | ICD-10-CM

## 2017-01-05 DIAGNOSIS — R7989 Other specified abnormal findings of blood chemistry: Secondary | ICD-10-CM | POA: Diagnosis not present

## 2017-01-05 DIAGNOSIS — Z6841 Body Mass Index (BMI) 40.0 and over, adult: Secondary | ICD-10-CM | POA: Diagnosis not present

## 2017-01-05 MED ORDER — POTASSIUM CHLORIDE CRYS ER 20 MEQ PO TBCR: 20.0000 meq | EXTENDED_RELEASE_TABLET | Freq: Two times a day (BID) | ORAL | 0 refills | Status: DC

## 2017-01-05 NOTE — Progress Notes (Signed)
Office: [phone]  /  Fax: [phone]   HPI:   Chief Complaint: OBESITY Heather [name] is here to discuss her progress with her obesity treatment plan. She is on the Category 2 plan and is following her eating plan approximately 90-95 % of the time. She states she is exercising 0 minutes 0 times per week. Heather [name] did very well with weight loss on the Category 2 plan. She struggled to eat allof her food and hunger is controlled.  Her weight is 256 lb (116.1 kg) today and has had a weight loss of 12 pounds over a period of 2 weeks since her last visit. She has lost 12 lbs since starting treatment with [name].  Bilateral Leg Cramping Heather [name] had 3 episodes in the last 2 weeks of severe calf cramping, mostly happens later in the day and at bedtime.  Elevated Vitamin B12 Heather [name] has a diagnosis of elevated B12. She is on OTC Vitamin B12 and level is now very high. She was informed that continuing her B12 is no longer helping.   ALLERGIES: Allergies  Allergen Reactions  . Aspirin   . Coconut Oil   . Latex   . Lisinopril     REACTION: cough  . Metoprolol Itching  . Peanut-Containing Drug Products   . Penicillins     REACTION: rash  . Prednisone   . Strawberry Extract     MEDICATIONS: Current Outpatient Medications on File Prior to Visit  Medication Sig Dispense Refill  . acetaminophen (TYLENOL) 500 MG tablet Take 650 mg by mouth 3 (three) times daily as needed for pain.     [name] ALLEGRA-D ALLERGY & CONGESTION 180-240 MG 24 hr tablet TAKE 1 TABLET BY MOUTH DAILY 30 tablet 0  . Calcium Carbonate-Vitamin D (CALCIUM 600 + D PO) Take 2 tablets by mouth daily.      [name] CALCIUM PO Take by mouth. Plus Vitamin D    . clopidogrel (PLAVIX) 75 MG tablet Take 1 tablet (75 mg total) by mouth daily. 90 tablet 3  . cyanocobalamin 1000 MCG tablet Take 1,000 mcg by mouth daily.    . Ferrous Sulfate (IRON) 325 (65 FE) MG TABS Take 1 tablet by mouth daily.      . furosemide (LASIX) 40 MG tablet TAKE 1 TABLET EVERY  DAY 90 tablet 3  . gabapentin (NEURONTIN) 100 MG capsule TAKE 2 CAPSULES(200 MG) BY MOUTH AT BEDTIME 180 capsule 1  . glucosamine-chondroitin 500-400 MG tablet Take 1 tablet by mouth 2 (two) times daily.      [name] losartan (COZAAR) 25 MG tablet TAKE 1 TABLET EVERY DAY 90 tablet 3  . metFORMIN (GLUCOPHAGE-XR) 500 MG 24 hr tablet TAKE 2 TABLETS TWICE DAILY 360 tablet 3  . modafinil (PROVIGIL) 200 MG tablet Take 1 tablet (200 mg total) by mouth daily. 30 tablet 1  . Multiple Vitamin (MULTIVITAMIN) tablet Take 1 tablet by mouth daily.      . naproxen sodium (ANAPROX) 220 MG tablet Take 220 mg by mouth as needed.    . Omega-3 Fatty Acids (FISH OIL) 1000 MG CAPS Take 1 capsule by mouth daily.    [name] omeprazole (PRILOSEC) 20 MG capsule Take 20 mg by mouth daily as needed.     . Probiotic Product (PROBIOTIC-10) CAPS Take 1 capsule by mouth daily.    . pseudoephedrine-acetaminophen (TYLENOL SINUS) 30-500 MG TABS tablet Take 1 tablet by mouth as needed.    . rosuvastatin (CRESTOR) 40 MG tablet TAKE 1 TABLET EVERY DAY 90 tablet  3  . SF 5000 PLUS 1.1 % CREA dental cream   0  . SUMAtriptan (IMITREX) 20 MG/ACT nasal spray One spray (20 mg) in the nostril at the onset of migraine.May repeat in 2 hours if needed. No more than 2 sprays in a 24 hour period 12 Inhaler 1  . topiramate (TOPAMAX) 100 MG tablet Take 1 tablet (100 mg total) by mouth daily. 90 tablet 3  . traMADol (ULTRAM) 50 MG tablet Take 1 tablet (50 mg total) by mouth every 12 (twelve) hours as needed. 60 tablet 0  . Turmeric 500 MG TABS Take 2 tablets by mouth daily.    [name] venlafaxine XR (EFFEXOR-XR) 37.5 MG 24 hr capsule TAKE 1 CAPSULE (37.5 MG TOTAL) BY MOUTH DAILY WITH BREAKFAST. 90 capsule 1  . vitamin E 200 UNIT capsule Take 200 Units by mouth daily.     Current Facility-Administered Medications on File Prior to Visit  Medication Dose Route Frequency Provider Last Rate Last Dose  . 0.9 %  sodium chloride infusion  500 mL Intravenous Continuous  Nandigam, Venia Minks, MD        PAST MEDICAL HISTORY: Past Medical History:  Diagnosis Date  . Abnormal Pap smear   . ALLERGIC RHINITIS 10/22/2006  . ANEMIA 12/18/2008  . Arthritis   . ASYMPTOMATIC POSTMENOPAUSAL STATUS 11/22/2007  . Back pain   . Complication of anesthesia   . Degenerative arthritis   . DIABETES MELLITUS, TYPE II 01/13/2007  . Dyslipidemia   . Fibroid   . GERD 07/20/2007  . GOITER, MULTINODULAR 07/20/2007  . Headache(784.0) 07/20/2007  . HEARING LOSS 11/22/2007  . Hiatal hernia   . Hx of colposcopy with cervical biopsy   . HYPERCHOLESTEROLEMIA 01/13/2007  . Hyperglycemia   . HYPERTENSION 10/22/2006  . NASH (nonalcoholic steatohepatitis)   . Nocturnal hypoxemia 02/17/2013  . Obesity   . Obstructive sleep apnea   . OSTEOARTHRITIS 10/22/2006  . Other chronic nonalcoholic liver disease 2/62/0355  . Sleep apnea     PAST SURGICAL HISTORY: Past Surgical History:  Procedure Laterality Date  . BREAST SURGERY     Breast reduction  . CATARACT EXTRACTION, BILATERAL    . DEXA  08/2005  . DILATION AND CURETTAGE OF UTERUS    . ELECTROCARDIOGRAM  10/15/2006  . ESOPHAGOGASTRODUODENOSCOPY  12/08/2005  . Stress Cardiolite  10/21/2005  . sweat gland removal    . WISDOM TOOTH EXTRACTION      SOCIAL HISTORY: Social History   Tobacco Use  . Smoking status: Former Smoker    Packs/day: 2.00    Years: 20.00    Pack years: 40.00    Types: Cigarettes    Last attempt to quit: 03/03/1979    Years since quitting: 37.8  . Smokeless tobacco: Never Used  Substance Use Topics  . Alcohol use: Yes    Comment: rare  . Drug use: No    FAMILY HISTORY: Family History  Problem Relation Age of Onset  . Asthma Mother   . Depression Mother   . Bipolar disorder Mother   . Dementia Mother   . Arthritis Mother   . Breast cancer Mother        primary  . Colon cancer Mother        mets from breast  . Cancer Mother   . Hypertension Father   . Migraines Father   . Cancer Maternal  Aunt   . Heart disease Maternal Grandmother   . Cancer Maternal Grandfather     ROS: Review  of Systems  Constitutional: Positive for weight loss.  Cardiovascular:       Leg cramping    PHYSICAL EXAM: Blood pressure 104/68, pulse 68, temperature 98 F (36.7 C), temperature source Oral, height 5' 5"  (1.651 m), weight 256 lb (116.1 kg), SpO2 94 %. Body mass index is 42.6 kg/m. Physical Exam  Constitutional: She is oriented to person, place, and time. She appears well-developed and well-nourished.  Cardiovascular: Normal rate.  Pulmonary/Chest: Effort normal.  Musculoskeletal: Normal range of motion.  Neurological: She is oriented to person, place, and time.  Skin: Skin is warm and dry.  Psychiatric: She has a normal mood and affect. Her behavior is normal.  Vitals reviewed.   RECENT LABS AND TESTS: BMET    Component Value Date/Time   NA 142 12/23/2016 0956   K 3.6 12/23/2016 0956   CL 102 12/23/2016 0956   CO2 30 (H) 12/23/2016 0956   GLUCOSE 89 12/23/2016 0956   GLUCOSE 116 (H) 03/05/2016 0958   BUN 13 12/23/2016 0956   CREATININE 0.74 12/23/2016 0956   CREATININE 0.81 12/28/2011 0853   CALCIUM 9.3 12/23/2016 0956   GFRNONAA 82 12/23/2016 0956   GFRAA 95 12/23/2016 0956   Lab Results  Component Value Date   HGBA1C 6.3 (H) 12/23/2016   HGBA1C 6.3 09/08/2016   HGBA1C 6.4 03/05/2016   HGBA1C 5.9 08/14/2015   HGBA1C 6.0 02/15/2015   Lab Results  Component Value Date   INSULIN 20.9 12/23/2016   CBC    Component Value Date/Time   WBC 4.9 12/23/2016 0956   WBC 7.2 03/05/2016 0958   RBC 4.32 12/23/2016 0956   RBC 4.46 03/05/2016 0958   HGB 12.1 12/23/2016 0956   HCT 37.4 12/23/2016 0956   PLT 300.0 03/05/2016 0958   MCV 87 12/23/2016 0956   MCH 28.0 12/23/2016 0956   MCH 29.4 12/28/2011 0853   MCHC 32.4 12/23/2016 0956   MCHC 32.9 03/05/2016 0958   RDW 17.5 (H) 12/23/2016 0956   LYMPHSABS 1.2 12/23/2016 0956   MONOABS 0.4 03/05/2016 0958   EOSABS 0.1  12/23/2016 0956   BASOSABS 0.0 12/23/2016 0956   Iron/TIBC/Ferritin/ %Sat    Component Value Date/Time   IRON 38 12/23/2016 0956   TIBC 271 12/23/2016 0956   FERRITIN 40 12/23/2016 0956   IRONPCTSAT 14 (L) 12/23/2016 0956   Lipid Panel     Component Value Date/Time   CHOL 178 12/23/2016 0956   TRIG 80 12/23/2016 0956   HDL 81 12/23/2016 0956   CHOLHDL 3 03/05/2016 0958   VLDL 26.0 03/05/2016 0958   LDLCALC 81 12/23/2016 0956   Hepatic Function Panel     Component Value Date/Time   PROT 7.4 12/23/2016 0956   ALBUMIN 4.1 12/23/2016 0956   AST 17 12/23/2016 0956   ALT 13 12/23/2016 0956   ALKPHOS 71 12/23/2016 0956   BILITOT <0.2 12/23/2016 0956   BILIDIR 0.1 02/15/2015 1010   IBILI 0.2 12/28/2011 0853      Component Value Date/Time   TSH 1.170 12/23/2016 0956   TSH 1.76 03/05/2016 0958   TSH 1.86 02/15/2015 1010    ASSESSMENT AND PLAN: Bilateral leg cramps  High serum vitamin B12  Class 3 severe obesity with serious comorbidity and body mass index (BMI) of 40.0 to 44.9 in adult, unspecified obesity type (HCC)  PLAN:  Bilateral Leg Cramping Heather [name] agrees to increase KCI to 20 meq BID #60 with no refills. Heather [name] agrees to follow up with our clinic in 2  weeks with our dietitian and follow up in 4 weeks with myself.  Elevated Vitamin B12 Heather [name] agrees to discontinue OTC Vitamin B12 for now. Heather [name] agrees to follow up with our clinic in 2 weeks with our dietitian and follow up in 4 weeks with myself.  Obesity Heather [name] is currently in the action stage of change. As such, her goal is to continue with weight loss efforts She has agreed to follow the Category 2 plan Heather [name] has been instructed to work up to a goal of 150 minutes of combined cardio and strengthening exercise per week for weight loss and overall health benefits. We discussed the following Behavioral Modification Strategies today: increasing lean protein intake, decreasing simple carbohydrates, work on meal  planning and easy cooking plans, and start using cast iron skillet to help keep iron levels up.  Heather [name] has agreed to follow up with our clinic in 2 weeks with our dietitian and follow up in 4 weeks with myself. She was informed of the importance of frequent follow up visits to maximize her success with intensive lifestyle modifications for her multiple health conditions.  I, Heather [name], am acting as transcriptionist for Heather Nip, MD  I have reviewed the above documentation for accuracy and completeness, and I agree with the above. -Heather Nip, MD      Today's visit was # 2 out of 75.  Starting weight: 268 lbs Starting date: 12/23/16 Today's weight : 256 lbs  Today's date: 01/05/2017 Total lbs lost to date: 12 (Patients must lose 7 lbs in the first 6 months to continue with counseling)   ASK: We discussed the diagnosis of obesity with Heather [name] today and Heather [name] agreed to give [name] permission to discuss obesity behavioral modification therapy today.  ASSESS: Heather [name] has the diagnosis of obesity and her BMI today is 42.6 Heather [name] is in the action stage of change   ADVISE: Heather [name] was educated on the multiple health risks of obesity as well as the benefit of weight loss to improve her health. She was advised of the need for long term treatment and the importance of lifestyle modifications.  AGREE: Multiple dietary modification options and treatment options were discussed and  Heather [name] agreed to follow the Category 2 plan We discussed the following Behavioral Modification Strategies today: increasing lean protein intake, decreasing simple carbohydrates, work on meal planning and easy cooking plans, and start using cast iron skillet to help keep iron levels up.

## 2017-01-07 ENCOUNTER — Other Ambulatory Visit: Payer: Self-pay | Admitting: Endocrinology

## 2017-01-19 ENCOUNTER — Ambulatory Visit (INDEPENDENT_AMBULATORY_CARE_PROVIDER_SITE_OTHER): Payer: Medicare Other | Admitting: Dietician

## 2017-01-19 VITALS — Ht 65.0 in | Wt 253.0 lb

## 2017-01-19 DIAGNOSIS — Z6841 Body Mass Index (BMI) 40.0 and over, adult: Secondary | ICD-10-CM

## 2017-01-20 ENCOUNTER — Encounter: Payer: Self-pay | Admitting: Family Medicine

## 2017-01-20 ENCOUNTER — Ambulatory Visit (INDEPENDENT_AMBULATORY_CARE_PROVIDER_SITE_OTHER): Payer: Medicare Other | Admitting: Family Medicine

## 2017-01-20 DIAGNOSIS — M170 Bilateral primary osteoarthritis of knee: Secondary | ICD-10-CM

## 2017-01-20 DIAGNOSIS — I2510 Atherosclerotic heart disease of native coronary artery without angina pectoris: Secondary | ICD-10-CM

## 2017-01-20 NOTE — Patient Instructions (Signed)
Good to see you  Tried the monovisc today  Ice is your friend Stay active Maybe wear braces if it helps for the weekend See me again in 6 weeks Happy holidays!

## 2017-01-20 NOTE — Progress Notes (Signed)
[name] [address] [place], [place] [address] Phone: [phone] Subjective:    CC: Bilateral knee pain  [id number]  Heather [name] is a 71 y.o. female coming in for follow up for knee pain. She was feeling good but she went to an event and walked up an incline without her cane. After that she has had constant pain with stairs. She also has a skin rash on both legs for approximately 1 week. Patient states that it does itch but vasoline does decrease the item.   Most arthritic changes in the knees and last injections 4 weeks ago.      Past Medical History:  Diagnosis Date  . Abnormal Pap smear   . ALLERGIC RHINITIS 10/22/2006  . ANEMIA 12/18/2008  . Arthritis   . ASYMPTOMATIC POSTMENOPAUSAL STATUS 11/22/2007  . Back pain   . Complication of anesthesia   . Degenerative arthritis   . DIABETES MELLITUS, TYPE II 01/13/2007  . Dyslipidemia   . Fibroid   . GERD 07/20/2007  . GOITER, MULTINODULAR 07/20/2007  . Headache(784.0) 07/20/2007  . HEARING LOSS 11/22/2007  . Hiatal hernia   . Hx of colposcopy with cervical biopsy   . HYPERCHOLESTEROLEMIA 01/13/2007  . Hyperglycemia   . HYPERTENSION 10/22/2006  . NASH (nonalcoholic steatohepatitis)   . Nocturnal hypoxemia 02/17/2013  . Obesity   . Obstructive sleep apnea   . OSTEOARTHRITIS 10/22/2006  . Other chronic nonalcoholic liver disease 5/45/6256  . Sleep apnea    Past Surgical History:  Procedure Laterality Date  . BREAST SURGERY     Breast reduction  . CATARACT EXTRACTION, BILATERAL    . DEXA  08/2005  . DILATION AND CURETTAGE OF UTERUS    . ELECTROCARDIOGRAM  10/15/2006  . ESOPHAGOGASTRODUODENOSCOPY  12/08/2005  . Stress Cardiolite  10/21/2005  . sweat gland removal    . WISDOM TOOTH EXTRACTION     Social History   Socioeconomic History  . Marital status: Married    Spouse name: [name]  . Number of children: 2  . Years of education: College  . Highest education level: Not on  file  Social Needs  . Financial resource strain: Not on file  . Food insecurity - worry: Not on file  . Food insecurity - inability: Not on file  . Transportation needs - medical: Not on file  . Transportation needs - non-medical: Not on file  Occupational History  . Occupation: Retired  Tobacco Use  . Smoking status: Former Smoker    Packs/day: 2.00    Years: 20.00    Pack years: 40.00    Types: Cigarettes    Last attempt to quit: 03/03/1979    Years since quitting: 37.9  . Smokeless tobacco: Never Used  Substance and Sexual Activity  . Alcohol use: Yes    Comment: rare  . Drug use: No  . Sexual activity: Yes    Birth control/protection: Surgical  Other Topics Concern  . Not on file  Social History Narrative   Patient is married [name]).   Patient has two children.   Patient is retired.   Patient has a college education.   Patient is right-handed.   Patient lives at home with family.   Caffeine Use: 2 soda every other day   Allergies  Allergen Reactions  . Aspirin   . Coconut Oil   . Latex   . Lisinopril     REACTION: cough  . Metoprolol Itching  . Peanut-Containing Drug Products   .  Penicillins     REACTION: rash  . Prednisone   . Strawberry Extract    Family History  Problem Relation Age of Onset  . Asthma Mother   . Depression Mother   . Bipolar disorder Mother   . Dementia Mother   . Arthritis Mother   . Breast cancer Mother        primary  . Colon cancer Mother        mets from breast  . Cancer Mother   . Hypertension Father   . Migraines Father   . Cancer Maternal Aunt   . Heart disease Maternal Grandmother   . Cancer Maternal Grandfather      Past medical history, social, surgical and family history all reviewed in electronic medical record.  No pertanent information unless stated regarding to the chief complaint.   Review of Systems:Review of systems updated and as accurate as of 01/20/17  No headache, visual changes, nausea, vomiting,  diarrhea, constipation, dizziness, abdominal pain, skin rash, fevers, chills, night sweats, weight loss, swollen lymph nodes,, chest pain, shortness of breath, mood changes.  Positive muscle aches, body aches joint swelling  Objective  There were no vitals taken for this visit. Systems examined below as of 01/20/17   General: No apparent distress alert and oriented x3 mood and affect normal, dressed appropriately.  HEENT: Pupils equal, extraocular movements intact  Respiratory: Patient's speak in full sentences and does not appear short of breath  Cardiovascular: No lower extremity edema, non tender, no erythema  Skin: Warm dry intact with no signs of infection or rash on extremities or on axial skeleton.  Abdomen: Soft nontender  Neuro: Cranial nerves II through XII are intact, neurovascularly intact in all extremities with 2+ DTRs and 2+ pulses.  Lymph: No lymphadenopathy of posterior or anterior cervical chain or axillae bilaterally.  Gait antalgic gait walking with the aid of a cane MSK:  Non tender with full range of motion and good stability and symmetric strength and tone of shoulders, elbows, wrist, hip, and ankles bilaterally.  Mild arthritic changes of multiple joints Knee: Bilateral valgus deformity noted. Large thigh to calf ratio.  Tender to palpation over medial and PF joint line.  ROM full in flexion and extension and lower leg rotation. instability with valgus force.  painful patellar compression. Patellar glide with moderate crepitus. Patellar and quadriceps tendons unremarkable. Hamstring and quadriceps strength is normal.  After informed written and verbal consent, patient was seated on exam table. Right knee was prepped with alcohol swab and utilizing anterolateral approach, patient's right knee space was injected with 76m/mL of monovisc (sodium hyaluronate) in a prefilled syringe was injected easily into the knee through a 22-gauge needle..Patient tolerated the  procedure well without immediate complications.  After informed written and verbal consent, patient was seated on exam table. Left knee was prepped with alcohol swab and utilizing anterolateral approach, patient's left knee space was injected with 217mmL monovisc (sodium hyaluronate) in a prefilled syringe was injected easily into the knee through a 22-gauge needle..Patient tolerated the procedure well without immediate complications.    Impression and Recommendations:     This case required medical decision making of moderate complexity.      Note: This dictation was prepared with Dragon dictation along with smaller phrase technology. Any transcriptional errors that result from this process are unintentional.

## 2017-01-20 NOTE — Assessment & Plan Note (Signed)
Patient did try this approximately 1 year ago.  Was repeating well with conservative therapy.  Patient encouraged to wear the braces on a regular basis.  We discussed icing regimen, home exercises, which activities to do which wants to avoid.  Patient will follow-up again in 6 weeks secondary to the worsening pain today.

## 2017-01-25 NOTE — Progress Notes (Signed)
  Office: [phone]  /  Fax: [phone]  OBESITY BEHAVIORAL INTERVENTION VISIT  Today's visit was # 3 out of 22.  Starting weight: 268 lbs  Starting date: 12/23/16 Today's weight : Weight: 253 lb (114.8 kg)  Today's date:01/19/17 Total lbs lost to date: 15 (Patients must lose 7 lbs in the first 6 months to continue with counseling)   ASK: We discussed the diagnosis of obesity with Heather [name] today and Heather [name] agreed to give [name] permission to discuss obesity behavioral modification therapy today.  Heather [name] is following our category 2 meal plan approximately 99% of the time. She denies problems with cravings and states her hunger is manageable. She states she often eats her breakfast foods for a snack due to not feeling hungry in the mornings. Heather [name] has a diagnosis of type 2 diabetes. Healthy eating for the Thanksgiving holiday was discussed in detail.   ASSESS: Heather [name] has the diagnosis of obesity and her BMI today is 43.41 Heather [name] is in the action stage of change   ADVISE: Heather [name] was educated on the multiple health risks of obesity as well as the benefit of weight loss to improve her health. She was advised of the need for long term treatment and the importance of lifestyle modifications.  AGREE: Multiple dietary modification options and treatment options were discussed and  Heather [name] agreed to follow the Category 2 plan We discussed the following Behavioral Modification Stratagies today: holiday eating strategies

## 2017-02-01 ENCOUNTER — Ambulatory Visit (INDEPENDENT_AMBULATORY_CARE_PROVIDER_SITE_OTHER): Payer: Medicare Other | Admitting: Family Medicine

## 2017-02-01 VITALS — BP 119/76 | HR 74 | Temp 98.3°F | Ht 65.0 in | Wt 249.0 lb

## 2017-02-01 DIAGNOSIS — G43709 Chronic migraine without aura, not intractable, without status migrainosus: Secondary | ICD-10-CM

## 2017-02-01 DIAGNOSIS — Z6841 Body Mass Index (BMI) 40.0 and over, adult: Secondary | ICD-10-CM

## 2017-02-01 DIAGNOSIS — IMO0002 Reserved for concepts with insufficient information to code with codable children: Secondary | ICD-10-CM

## 2017-02-01 NOTE — Progress Notes (Signed)
Office: [phone]  /  Fax: [phone]   HPI:   Chief Complaint: OBESITY Heather [name] is here to discuss her progress with her obesity treatment plan. She is on the Category 2 plan and is following her eating plan approximately 98 % of the time. She states she is exercising 0 minutes 0 times per week. Heather [name] continues to do very well with weight loss, her hunger is controlled to the point that she doesn't always eat her food especially for dinner.  Her weight is 249 lb (112.9 kg) today and has had a weight loss of 4 pounds over a period of 4 weeks since her last visit. She has lost 19 lbs since starting treatment with [name].  Chronic Migraine Heather [name]'s migraines are well controlled on Topamax, She notes headaches are rare and she takes Imitrex when headache starts.  ALLERGIES: Allergies  Allergen Reactions  . Aspirin   . Coconut Oil   . Latex   . Lisinopril     REACTION: cough  . Metoprolol Itching  . Peanut-Containing Drug Products   . Penicillins     REACTION: rash  . Prednisone   . Strawberry Extract     MEDICATIONS: Current Outpatient Medications on File Prior to Visit  Medication Sig Dispense Refill  . acetaminophen (TYLENOL) 500 MG tablet Take 650 mg by mouth 3 (three) times daily as needed for pain.     [name] ALLEGRA-D ALLERGY & CONGESTION 180-240 MG 24 hr tablet TAKE 1 TABLET BY MOUTH DAILY 30 tablet 0  . Calcium Carbonate-Vitamin D (CALCIUM 600 + D PO) Take 2 tablets by mouth daily.      [name] CALCIUM PO Take by mouth. Plus Vitamin D    . clopidogrel (PLAVIX) 75 MG tablet Take 1 tablet (75 mg total) by mouth daily. 90 tablet 3  . cyanocobalamin 1000 MCG tablet Take 1,000 mcg by mouth daily.    . Ferrous Sulfate (IRON) 325 (65 FE) MG TABS Take 1 tablet by mouth daily.      . furosemide (LASIX) 40 MG tablet TAKE 1 TABLET EVERY DAY 90 tablet 3  . gabapentin (NEURONTIN) 100 MG capsule TAKE 2 CAPSULES(200 MG) BY MOUTH AT BEDTIME 180 capsule 1  . glucosamine-chondroitin 500-400 MG  tablet Take 1 tablet by mouth 2 (two) times daily.      [name] losartan (COZAAR) 25 MG tablet TAKE 1 TABLET EVERY DAY 90 tablet 3  . metFORMIN (GLUCOPHAGE-XR) 500 MG 24 hr tablet TAKE 2 TABLETS TWICE DAILY 360 tablet 3  . modafinil (PROVIGIL) 200 MG tablet Take 1 tablet (200 mg total) by mouth daily. 30 tablet 1  . Multiple Vitamin (MULTIVITAMIN) tablet Take 1 tablet by mouth daily.      . naproxen sodium (ANAPROX) 220 MG tablet Take 220 mg by mouth as needed.    . Omega-3 Fatty Acids (FISH OIL) 1000 MG CAPS Take 1 capsule by mouth daily.    [name] omeprazole (PRILOSEC) 20 MG capsule Take 20 mg by mouth daily as needed.     . potassium chloride SA (K-DUR,KLOR-CON) 20 MEQ tablet Take 1 tablet (20 mEq total) 2 (two) times daily by mouth. 60 tablet 0  . Probiotic Product (PROBIOTIC-10) CAPS Take 1 capsule by mouth daily.    . pseudoephedrine-acetaminophen (TYLENOL SINUS) 30-500 MG TABS tablet Take 1 tablet by mouth as needed.    . rosuvastatin (CRESTOR) 40 MG tablet TAKE 1 TABLET EVERY DAY 90 tablet 3  . SF 5000 PLUS 1.1 % CREA dental cream  0  . SUMAtriptan (IMITREX) 20 MG/ACT nasal spray One spray (20 mg) in the nostril at the onset of migraine.May repeat in 2 hours if needed. No more than 2 sprays in a 24 hour period 12 Inhaler 1  . topiramate (TOPAMAX) 100 MG tablet Take 1 tablet (100 mg total) by mouth daily. 90 tablet 3  . traMADol (ULTRAM) 50 MG tablet Take 1 tablet (50 mg total) by mouth every 12 (twelve) hours as needed. 60 tablet 0  . Turmeric 500 MG TABS Take 2 tablets by mouth daily.    [name] venlafaxine XR (EFFEXOR-XR) 37.5 MG 24 hr capsule TAKE 1 CAPSULE (37.5 MG TOTAL) BY MOUTH DAILY WITH BREAKFAST. 90 capsule 1  . vitamin E 200 UNIT capsule Take 200 Units by mouth daily.     Current Facility-Administered Medications on File Prior to Visit  Medication Dose Route Frequency Provider Last Rate Last Dose  . 0.9 %  sodium chloride infusion  500 mL Intravenous Continuous Nandigam, Venia Minks, MD         PAST MEDICAL HISTORY: Past Medical History:  Diagnosis Date  . Abnormal Pap smear   . ALLERGIC RHINITIS 10/22/2006  . ANEMIA 12/18/2008  . Arthritis   . ASYMPTOMATIC POSTMENOPAUSAL STATUS 11/22/2007  . Back pain   . Complication of anesthesia   . Degenerative arthritis   . DIABETES MELLITUS, TYPE II 01/13/2007  . Dyslipidemia   . Fibroid   . GERD 07/20/2007  . GOITER, MULTINODULAR 07/20/2007  . Headache(784.0) 07/20/2007  . HEARING LOSS 11/22/2007  . Hiatal hernia   . Hx of colposcopy with cervical biopsy   . HYPERCHOLESTEROLEMIA 01/13/2007  . Hyperglycemia   . HYPERTENSION 10/22/2006  . NASH (nonalcoholic steatohepatitis)   . Nocturnal hypoxemia 02/17/2013  . Obesity   . Obstructive sleep apnea   . OSTEOARTHRITIS 10/22/2006  . Other chronic nonalcoholic liver disease 4/96/7591  . Sleep apnea     PAST SURGICAL HISTORY: Past Surgical History:  Procedure Laterality Date  . BREAST SURGERY     Breast reduction  . CATARACT EXTRACTION, BILATERAL    . DEXA  08/2005  . DILATION AND CURETTAGE OF UTERUS    . ELECTROCARDIOGRAM  10/15/2006  . ESOPHAGOGASTRODUODENOSCOPY  12/08/2005  . Stress Cardiolite  10/21/2005  . sweat gland removal    . WISDOM TOOTH EXTRACTION      SOCIAL HISTORY: Social History   Tobacco Use  . Smoking status: Former Smoker    Packs/day: 2.00    Years: 20.00    Pack years: 40.00    Types: Cigarettes    Last attempt to quit: 03/03/1979    Years since quitting: 37.9  . Smokeless tobacco: Never Used  Substance Use Topics  . Alcohol use: Yes    Comment: rare  . Drug use: No    FAMILY HISTORY: Family History  Problem Relation Age of Onset  . Asthma Mother   . Depression Mother   . Bipolar disorder Mother   . Dementia Mother   . Arthritis Mother   . Breast cancer Mother        primary  . Colon cancer Mother        mets from breast  . Cancer Mother   . Hypertension Father   . Migraines Father   . Cancer Maternal Aunt   . Heart disease  Maternal Grandmother   . Cancer Maternal Grandfather     ROS: Review of Systems  Constitutional: Positive for weight loss.  Neurological: Positive for headaches.  PHYSICAL EXAM: Blood pressure 119/76, pulse 74, temperature 98.3 F (36.8 C), temperature source Oral, height 5' 5"  (1.651 m), weight 249 lb (112.9 kg), SpO2 96 %. Body mass index is 41.44 kg/m. Physical Exam  Constitutional: She is oriented to person, place, and time. She appears well-developed and well-nourished.  Cardiovascular: Normal rate.  Pulmonary/Chest: Effort normal.  Musculoskeletal: Normal range of motion.  Neurological: She is oriented to person, place, and time.  Skin: Skin is warm and dry.  Psychiatric: She has a normal mood and affect. Her behavior is normal.  Vitals reviewed.   RECENT LABS AND TESTS: BMET    Component Value Date/Time   NA 142 12/23/2016 0956   K 3.6 12/23/2016 0956   CL 102 12/23/2016 0956   CO2 30 (H) 12/23/2016 0956   GLUCOSE 89 12/23/2016 0956   GLUCOSE 116 (H) 03/05/2016 0958   BUN 13 12/23/2016 0956   CREATININE 0.74 12/23/2016 0956   CREATININE 0.81 12/28/2011 0853   CALCIUM 9.3 12/23/2016 0956   GFRNONAA 82 12/23/2016 0956   GFRAA 95 12/23/2016 0956   Lab Results  Component Value Date   HGBA1C 6.3 (H) 12/23/2016   HGBA1C 6.3 09/08/2016   HGBA1C 6.4 03/05/2016   HGBA1C 5.9 08/14/2015   HGBA1C 6.0 02/15/2015   Lab Results  Component Value Date   INSULIN 20.9 12/23/2016   CBC    Component Value Date/Time   WBC 4.9 12/23/2016 0956   WBC 7.2 03/05/2016 0958   RBC 4.32 12/23/2016 0956   RBC 4.46 03/05/2016 0958   HGB 12.1 12/23/2016 0956   HCT 37.4 12/23/2016 0956   PLT 300.0 03/05/2016 0958   MCV 87 12/23/2016 0956   MCH 28.0 12/23/2016 0956   MCH 29.4 12/28/2011 0853   MCHC 32.4 12/23/2016 0956   MCHC 32.9 03/05/2016 0958   RDW 17.5 (H) 12/23/2016 0956   LYMPHSABS 1.2 12/23/2016 0956   MONOABS 0.4 03/05/2016 0958   EOSABS 0.1 12/23/2016 0956    BASOSABS 0.0 12/23/2016 0956   Iron/TIBC/Ferritin/ %Sat    Component Value Date/Time   IRON 38 12/23/2016 0956   TIBC 271 12/23/2016 0956   FERRITIN 40 12/23/2016 0956   IRONPCTSAT 14 (L) 12/23/2016 0956   Lipid Panel     Component Value Date/Time   CHOL 178 12/23/2016 0956   TRIG 80 12/23/2016 0956   HDL 81 12/23/2016 0956   CHOLHDL 3 03/05/2016 0958   VLDL 26.0 03/05/2016 0958   LDLCALC 81 12/23/2016 0956   Hepatic Function Panel     Component Value Date/Time   PROT 7.4 12/23/2016 0956   ALBUMIN 4.1 12/23/2016 0956   AST 17 12/23/2016 0956   ALT 13 12/23/2016 0956   ALKPHOS 71 12/23/2016 0956   BILITOT <0.2 12/23/2016 0956   BILIDIR 0.1 02/15/2015 1010   IBILI 0.2 12/28/2011 0853      Component Value Date/Time   TSH 1.170 12/23/2016 0956   TSH 1.76 03/05/2016 0958   TSH 1.86 02/15/2015 1010    ASSESSMENT AND PLAN: Chronic migraine  Class 3 severe obesity with serious comorbidity and body mass index (BMI) of 40.0 to 44.9 in adult, unspecified obesity type (HCC)  PLAN:  Chronic Migraine Heather [name] agrees to continue taking Topamax as prescribed and increase H20 intake to avoid dehydration headaches. Heather [name] agrees to follow up with our clinic in 2 weeks.  We spent > than 50% of the 15 minute visit on the counseling as documented in the note.  Obesity Heather [name] is currently  in the action stage of change. As such, her goal is to continue with weight loss efforts She has agreed to change to keep a food journal with 350-500 calories and 30+ grams of protein at supper daily and follow the Category 2 plan Heather [name] has been instructed to work up to a goal of 150 minutes of combined cardio and strengthening exercise per week for weight loss and overall health benefits. We discussed the following Behavioral Modification Strategies today: work on meal planning and easy cooking plans, emotional eating strategies, increase H20 intake, and keep a strict food journal   Heather [name] has agreed  to follow up with our clinic in 2 weeks. She was informed of the importance of frequent follow up visits to maximize her success with intensive lifestyle modifications for her multiple health conditions.  I, [name], am acting as transcriptionist for [name], MD  I have reviewed the above documentation for accuracy and completeness, and I agree with the above. -[name], MD     Today's visit was # 3 out of 22.  Starting weight: 268 lbs Starting date: 12/23/16 Today's weight : 249 lbs  Today's date: 02/01/2017 Total lbs lost to date: 38 (Patients must lose 7 lbs in the first 6 months to continue with counseling)   ASK: We discussed the diagnosis of obesity with Heather [name] today and Sister agreed to give [name] permission to discuss obesity behavioral modification therapy today.  ASSESS: Heather [name] has the diagnosis of obesity and her BMI today is 41.44 Heather [name] is in the action stage of change   ADVISE: Heather [name] was educated on the multiple health risks of obesity as well as the benefit of weight loss to improve her health. She was advised of the need for long term treatment and the importance of lifestyle modifications.  AGREE: Multiple dietary modification options and treatment options were discussed and  Heather [name] agreed to keep a food journal with 350-500 calories and 30+ grams of protein at supper daily and follow the Category 2 plan We discussed the following Behavioral Modification Strategies today: work on meal planning and easy cooking plans, emotional eating strategies, increase H20 intake, and keep a strict food journal

## 2017-02-03 ENCOUNTER — Other Ambulatory Visit: Payer: Self-pay | Admitting: Endocrinology

## 2017-02-11 ENCOUNTER — Other Ambulatory Visit: Payer: Self-pay | Admitting: Neurology

## 2017-02-11 DIAGNOSIS — E662 Morbid (severe) obesity with alveolar hypoventilation: Secondary | ICD-10-CM

## 2017-02-11 DIAGNOSIS — Z9989 Dependence on other enabling machines and devices: Principal | ICD-10-CM

## 2017-02-11 DIAGNOSIS — R0902 Hypoxemia: Secondary | ICD-10-CM

## 2017-02-11 DIAGNOSIS — G4733 Obstructive sleep apnea (adult) (pediatric): Secondary | ICD-10-CM

## 2017-02-15 ENCOUNTER — Ambulatory Visit (INDEPENDENT_AMBULATORY_CARE_PROVIDER_SITE_OTHER): Payer: Medicare Other | Admitting: Family Medicine

## 2017-02-15 VITALS — BP 101/66 | HR 85 | Temp 97.5°F | Ht 65.0 in | Wt 246.0 lb

## 2017-02-15 DIAGNOSIS — E119 Type 2 diabetes mellitus without complications: Secondary | ICD-10-CM

## 2017-02-15 DIAGNOSIS — Z6841 Body Mass Index (BMI) 40.0 and over, adult: Secondary | ICD-10-CM

## 2017-02-15 NOTE — Progress Notes (Signed)
Office: [phone]  /  Fax: [phone]   HPI:   Chief Complaint: OBESITY Heather [name] is here to discuss her progress with her obesity treatment plan. She is on the Category 2 plan and is following her eating plan approximately 80 to 85 % of the time. She states she is exercising 0 minutes 0 times per week. Heather [name] continues to do well with weight loss. She started trying to journal and journaled approximately 50% of the time. She is sometimes skipping meals still, but is trying to do better. Hunger is controlled. Heather [name] has made a lot better food choices in the past. She has questions about Christmas food choices. Her weight is 246 lb (111.6 kg) today and has had a weight loss of 3 pounds over a period of 2 weeks since her last visit. She has lost 22 lbs since starting treatment with [name].  Diabetes II Heather [name] has a diagnosis of diabetes type II. Heather [name] is doing better with checking BGs at home and she denies any hypoglycemic episodes. She has been working on intensive lifestyle modifications including diet, exercise, and weight loss to help control her blood glucose levels.  ALLERGIES: Allergies  Allergen Reactions  . Aspirin   . Coconut Oil   . Latex   . Lisinopril     REACTION: cough  . Metoprolol Itching  . Peanut-Containing Drug Products   . Penicillins     REACTION: rash  . Prednisone   . Strawberry Extract     MEDICATIONS: Current Outpatient Medications on File Prior to Visit  Medication Sig Dispense Refill  . acetaminophen (TYLENOL) 500 MG tablet Take 650 mg by mouth 3 (three) times daily as needed for pain.     [name] ALLEGRA-D ALLERGY & CONGESTION 180-240 MG 24 hr tablet TAKE 1 TABLET BY MOUTH DAILY 30 tablet 0  . Calcium Carbonate-Vitamin D (CALCIUM 600 + D PO) Take 2 tablets by mouth daily.      [name] CALCIUM PO Take by mouth. Plus Vitamin D    . clopidogrel (PLAVIX) 75 MG tablet Take 1 tablet (75 mg total) by mouth daily. 90 tablet 3  . cyanocobalamin 1000 MCG tablet Take 1,000 mcg  by mouth daily.    . Ferrous Sulfate (IRON) 325 (65 FE) MG TABS Take 1 tablet by mouth daily.      . furosemide (LASIX) 40 MG tablet TAKE 1 TABLET EVERY DAY 90 tablet 3  . gabapentin (NEURONTIN) 100 MG capsule TAKE 2 CAPSULES(200 MG) BY MOUTH AT BEDTIME 180 capsule 1  . glucosamine-chondroitin 500-400 MG tablet Take 1 tablet by mouth 2 (two) times daily.      [name] losartan (COZAAR) 25 MG tablet TAKE 1 TABLET EVERY DAY 90 tablet 3  . metFORMIN (GLUCOPHAGE-XR) 500 MG 24 hr tablet TAKE 2 TABLETS TWICE DAILY 360 tablet 3  . modafinil (PROVIGIL) 200 MG tablet Take 1 tablet (200 mg total) by mouth daily. 30 tablet 1  . Multiple Vitamin (MULTIVITAMIN) tablet Take 1 tablet by mouth daily.      . naproxen sodium (ANAPROX) 220 MG tablet Take 220 mg by mouth as needed.    . Omega-3 Fatty Acids (FISH OIL) 1000 MG CAPS Take 1 capsule by mouth daily.    [name] omeprazole (PRILOSEC) 20 MG capsule Take 20 mg by mouth daily as needed.     . potassium chloride SA (K-DUR,KLOR-CON) 20 MEQ tablet Take 1 tablet (20 mEq total) 2 (two) times daily by mouth. 60 tablet 0  . Probiotic Product (PROBIOTIC-10)  CAPS Take 1 capsule by mouth daily.    . pseudoephedrine-acetaminophen (TYLENOL SINUS) 30-500 MG TABS tablet Take 1 tablet by mouth as needed.    . rosuvastatin (CRESTOR) 40 MG tablet TAKE 1 TABLET EVERY DAY 90 tablet 3  . SF 5000 PLUS 1.1 % CREA dental cream   0  . SUMAtriptan (IMITREX) 20 MG/ACT nasal spray One spray (20 mg) in the nostril at the onset of migraine.May repeat in 2 hours if needed. No more than 2 sprays in a 24 hour period 12 Inhaler 1  . topiramate (TOPAMAX) 100 MG tablet TAKE 1 TABLET EVERY DAY 90 tablet 3  . traMADol (ULTRAM) 50 MG tablet Take 1 tablet (50 mg total) by mouth every 12 (twelve) hours as needed. 60 tablet 0  . Turmeric 500 MG TABS Take 2 tablets by mouth daily.    [name] venlafaxine XR (EFFEXOR-XR) 37.5 MG 24 hr capsule TAKE 1 CAPSULE (37.5 MG TOTAL) BY MOUTH DAILY WITH BREAKFAST. 90 capsule 1  .  vitamin E 200 UNIT capsule Take 200 Units by mouth daily.     Current Facility-Administered Medications on File Prior to Visit  Medication Dose Route Frequency Provider Last Rate Last Dose  . 0.9 %  sodium chloride infusion  500 mL Intravenous Continuous Nandigam, Venia Minks, MD        PAST MEDICAL HISTORY: Past Medical History:  Diagnosis Date  . Abnormal Pap smear   . ALLERGIC RHINITIS 10/22/2006  . ANEMIA 12/18/2008  . Arthritis   . ASYMPTOMATIC POSTMENOPAUSAL STATUS 11/22/2007  . Back pain   . Complication of anesthesia   . Degenerative arthritis   . DIABETES MELLITUS, TYPE II 01/13/2007  . Dyslipidemia   . Fibroid   . GERD 07/20/2007  . GOITER, MULTINODULAR 07/20/2007  . Headache(784.0) 07/20/2007  . HEARING LOSS 11/22/2007  . Hiatal hernia   . Hx of colposcopy with cervical biopsy   . HYPERCHOLESTEROLEMIA 01/13/2007  . Hyperglycemia   . HYPERTENSION 10/22/2006  . NASH (nonalcoholic steatohepatitis)   . Nocturnal hypoxemia 02/17/2013  . Obesity   . Obstructive sleep apnea   . OSTEOARTHRITIS 10/22/2006  . Other chronic nonalcoholic liver disease 0/92/3300  . Sleep apnea     PAST SURGICAL HISTORY: Past Surgical History:  Procedure Laterality Date  . BREAST SURGERY     Breast reduction  . CATARACT EXTRACTION, BILATERAL    . DEXA  08/2005  . DILATION AND CURETTAGE OF UTERUS    . ELECTROCARDIOGRAM  10/15/2006  . ESOPHAGOGASTRODUODENOSCOPY  12/08/2005  . Stress Cardiolite  10/21/2005  . sweat gland removal    . WISDOM TOOTH EXTRACTION      SOCIAL HISTORY: Social History   Tobacco Use  . Smoking status: Former Smoker    Packs/day: 2.00    Years: 20.00    Pack years: 40.00    Types: Cigarettes    Last attempt to quit: 03/03/1979    Years since quitting: 37.9  . Smokeless tobacco: Never Used  Substance Use Topics  . Alcohol use: Yes    Comment: rare  . Drug use: No    FAMILY HISTORY: Family History  Problem Relation Age of Onset  . Asthma Mother   .  Depression Mother   . Bipolar disorder Mother   . Dementia Mother   . Arthritis Mother   . Breast cancer Mother        primary  . Colon cancer Mother        mets from breast  .  Cancer Mother   . Hypertension Father   . Migraines Father   . Cancer Maternal Aunt   . Heart disease Maternal Grandmother   . Cancer Maternal Grandfather     ROS: Review of Systems  Constitutional: Positive for weight loss.  Endo/Heme/Allergies:       Negative hypoglycemia    PHYSICAL EXAM: Blood pressure 101/66, pulse 85, temperature (!) 97.5 F (36.4 C), temperature source Oral, height 5' 5"  (1.651 m), weight 246 lb (111.6 kg), SpO2 97 %. Body mass index is 40.94 kg/m. Physical Exam  Constitutional: She is oriented to person, place, and time. She appears well-developed and well-nourished.  Cardiovascular: Normal rate.  Pulmonary/Chest: Effort normal.  Neurological: She is oriented to person, place, and time.  Skin: Skin is warm and dry.  Psychiatric: She has a normal mood and affect. Her behavior is normal.  Vitals reviewed.   RECENT LABS AND TESTS: BMET    Component Value Date/Time   NA 142 12/23/2016 0956   K 3.6 12/23/2016 0956   CL 102 12/23/2016 0956   CO2 30 (H) 12/23/2016 0956   GLUCOSE 89 12/23/2016 0956   GLUCOSE 116 (H) 03/05/2016 0958   BUN 13 12/23/2016 0956   CREATININE 0.74 12/23/2016 0956   CREATININE 0.81 12/28/2011 0853   CALCIUM 9.3 12/23/2016 0956   GFRNONAA 82 12/23/2016 0956   GFRAA 95 12/23/2016 0956   Lab Results  Component Value Date   HGBA1C 6.3 (H) 12/23/2016   HGBA1C 6.3 09/08/2016   HGBA1C 6.4 03/05/2016   HGBA1C 5.9 08/14/2015   HGBA1C 6.0 02/15/2015   Lab Results  Component Value Date   INSULIN 20.9 12/23/2016   CBC    Component Value Date/Time   WBC 4.9 12/23/2016 0956   WBC 7.2 03/05/2016 0958   RBC 4.32 12/23/2016 0956   RBC 4.46 03/05/2016 0958   HGB 12.1 12/23/2016 0956   HCT 37.4 12/23/2016 0956   PLT 300.0 03/05/2016 0958   MCV  87 12/23/2016 0956   MCH 28.0 12/23/2016 0956   MCH 29.4 12/28/2011 0853   MCHC 32.4 12/23/2016 0956   MCHC 32.9 03/05/2016 0958   RDW 17.5 (H) 12/23/2016 0956   LYMPHSABS 1.2 12/23/2016 0956   MONOABS 0.4 03/05/2016 0958   EOSABS 0.1 12/23/2016 0956   BASOSABS 0.0 12/23/2016 0956   Iron/TIBC/Ferritin/ %Sat    Component Value Date/Time   IRON 38 12/23/2016 0956   TIBC 271 12/23/2016 0956   FERRITIN 40 12/23/2016 0956   IRONPCTSAT 14 (L) 12/23/2016 0956   Lipid Panel     Component Value Date/Time   CHOL 178 12/23/2016 0956   TRIG 80 12/23/2016 0956   HDL 81 12/23/2016 0956   CHOLHDL 3 03/05/2016 0958   VLDL 26.0 03/05/2016 0958   LDLCALC 81 12/23/2016 0956   Hepatic Function Panel     Component Value Date/Time   PROT 7.4 12/23/2016 0956   ALBUMIN 4.1 12/23/2016 0956   AST 17 12/23/2016 0956   ALT 13 12/23/2016 0956   ALKPHOS 71 12/23/2016 0956   BILITOT <0.2 12/23/2016 0956   BILIDIR 0.1 02/15/2015 1010   IBILI 0.2 12/28/2011 0853      Component Value Date/Time   TSH 1.170 12/23/2016 0956   TSH 1.76 03/05/2016 0958   TSH 1.86 02/15/2015 1010    ASSESSMENT AND PLAN: Type 2 diabetes mellitus without complication, without long-term current use of insulin (HCC)  Class 3 severe obesity with serious comorbidity and body mass index (BMI) of 40.0  to 44.9 in adult, unspecified obesity type (Heather [name])  PLAN:  Diabetes II Heather [name] has been given extensive diabetes education by myself today including ideal fasting and post-prandial blood glucose readings, individual ideal Hgb A1c goals and hypoglycemia prevention. We discussed the importance of good blood sugar control to decrease the likelihood of diabetic complications such as nephropathy, neuropathy, limb loss, blindness, coronary artery disease, and death. We discussed the importance of intensive lifestyle modification including diet, exercise and weight loss as the first line treatment for diabetes. Heather [name] agrees to continue to  work on diet, exercise and weight loss. Heather [name] agrees to continue her diabetes medications and will follow up at the agreed upon time.  We spent > than 50% of the 15 minute visit on the counseling as documented in the note.  Obesity Heather [name] is currently in the action stage of change. As such, her goal is to continue with weight loss efforts She has agreed to keep a food journal with 350 to 500 calories and 30+ grams of protein at supper daily and follow the Category 2 plan Heather [name] has been instructed to work up to a goal of 150 minutes of combined cardio and strengthening exercise per week for weight loss and overall health benefits. We discussed the following Behavioral Modification Strategies today: increasing lean protein intake, dealing with family or coworker sabotage, travel eating strategies and holiday eating strategies   Heather [name] has agreed to follow up with our clinic in 3 weeks. She was informed of the importance of frequent follow up visits to maximize her success with intensive lifestyle modifications for her multiple health conditions.  I, Doreene Nest, am acting as transcriptionist for [name], MD  I have reviewed the above documentation for accuracy and completeness, and I agree with the above. -[name], MD    OBESITY BEHAVIORAL INTERVENTION VISIT  Today's visit was # 4 out of 22.  Starting weight: 268 lbs Starting date: 12/23/16 Today's weight : 246 lbs Today's date: 02/15/2017 Total lbs lost to date: 17 (Patients must lose 7 lbs in the first 6 months to continue with counseling)   ASK: We discussed the diagnosis of obesity with Heather [name] today and Heather [name] agreed to give [name] permission to discuss obesity behavioral modification therapy today.  ASSESS: Heather [name] has the diagnosis of obesity and her BMI today is 40.94 Heather [name] is in the action stage of change   ADVISE: Heather [name] was educated on the multiple health risks of obesity as well as the benefit of weight  loss to improve her health. She was advised of the need for long term treatment and the importance of lifestyle modifications.  AGREE: Multiple dietary modification options and treatment options were discussed and  Heather [name] agreed to keep a food journal with 350 to 500 calories and 30+ grams of protein at supper daily and follow the Category 2 plan We discussed the following Behavioral Modification Strategies today: increasing lean protein intake, dealing with family or coworker sabotage, travel eating strategies and holiday eating strategies

## 2017-03-03 ENCOUNTER — Encounter: Payer: Self-pay | Admitting: Family Medicine

## 2017-03-03 ENCOUNTER — Ambulatory Visit (INDEPENDENT_AMBULATORY_CARE_PROVIDER_SITE_OTHER): Payer: Medicare Other | Admitting: Family Medicine

## 2017-03-03 VITALS — BP 110/78 | HR 76 | Ht 64.0 in | Wt 247.0 lb

## 2017-03-03 DIAGNOSIS — M170 Bilateral primary osteoarthritis of knee: Secondary | ICD-10-CM

## 2017-03-03 DIAGNOSIS — M25569 Pain in unspecified knee: Secondary | ICD-10-CM

## 2017-03-03 NOTE — Progress Notes (Signed)
[name] [address], [place] [address] Phone: [phone] Subjective:    I'm seeing this patient by the request  of:    CC: Bilateral knee pain follow-up  DTO:[id number]  Heather [name] is a 72 y.o. female coming in with complaint of bilateral knee pain.  Severe osteoarthritic changes of the knees bilaterally.  Patient has failed all conservative therapy.  Monovisc injections given in November which would have been her second series of Visco supplementation.  Patient states she continues to work on weight loss.  Patient has lost 22 pounds since [facility].  Patient states the injections did help her knees. Her right knee is doing better. The left knee has been bothering her due to using it more to take pressure off of the right knee. She feels that she is unable to lead with her right leg due to it being weak. She does ambulate with a cane occasionally but is not today.      Past Medical History:  Diagnosis Date  . Abnormal Pap smear   . ALLERGIC RHINITIS 10/22/2006  . ANEMIA 12/18/2008  . Arthritis   . ASYMPTOMATIC POSTMENOPAUSAL STATUS 11/22/2007  . Back pain   . Complication of anesthesia   . Degenerative arthritis   . DIABETES MELLITUS, TYPE II 01/13/2007  . Dyslipidemia   . Fibroid   . GERD 07/20/2007  . GOITER, MULTINODULAR 07/20/2007  . Headache(784.0) 07/20/2007  . HEARING LOSS 11/22/2007  . Hiatal hernia   . Hx of colposcopy with cervical biopsy   . HYPERCHOLESTEROLEMIA 01/13/2007  . Hyperglycemia   . HYPERTENSION 10/22/2006  . NASH (nonalcoholic steatohepatitis)   . Nocturnal hypoxemia 02/17/2013  . Obesity   . Obstructive sleep apnea   . OSTEOARTHRITIS 10/22/2006  . Other chronic nonalcoholic liver disease 8/33/8250  . Sleep apnea    Past Surgical History:  Procedure Laterality Date  . BREAST SURGERY     Breast reduction  . CATARACT EXTRACTION, BILATERAL    . DEXA  08/2005  .  DILATION AND CURETTAGE OF UTERUS    . ELECTROCARDIOGRAM  10/15/2006  . ESOPHAGOGASTRODUODENOSCOPY  12/08/2005  . Stress Cardiolite  10/21/2005  . sweat gland removal    . WISDOM TOOTH EXTRACTION     Social History   Socioeconomic History  . Marital status: Married    Spouse name: [name]  . Number of children: 2  . Years of education: College  . Highest education level: None  Social Needs  . Financial resource strain: None  . Food insecurity - worry: None  . Food insecurity - inability: None  . Transportation needs - medical: None  . Transportation needs - non-medical: None  Occupational History  . Occupation: Retired  Tobacco Use  . Smoking status: Former Smoker    Packs/day: 2.00    Years: 20.00    Pack years: 40.00    Types: Cigarettes    Last attempt to quit: 03/03/1979    Years since quitting: 38.0  . Smokeless tobacco: Never Used  Substance and Sexual Activity  . Alcohol use: Yes    Comment: rare  . Drug use: No  . Sexual activity: Yes    Birth control/protection: Surgical  Other Topics Concern  . None  Social History Narrative   Patient is married [name]).   Patient has two children.   Patient is retired.   Patient has a college education.   Patient is right-handed.  Patient lives at home with family.   Caffeine Use: 2 soda every other day   Allergies  Allergen Reactions  . Aspirin   . Coconut Oil   . Latex   . Lisinopril     REACTION: cough  . Metoprolol Itching  . Peanut-Containing Drug Products   . Penicillins     REACTION: rash  . Prednisone   . Strawberry Extract    Family History  Problem Relation Age of Onset  . Asthma Mother   . Depression Mother   . Bipolar disorder Mother   . Dementia Mother   . Arthritis Mother   . Breast cancer Mother        primary  . Colon cancer Mother        mets from breast  . Cancer Mother   . Hypertension Father   . Migraines Father   . Cancer Maternal Aunt   . Heart disease Maternal Grandmother     . Cancer Maternal Grandfather      Past medical history, social, surgical and family history all reviewed in electronic medical record.  No pertanent information unless stated regarding to the chief complaint.   Review of Systems:Review of systems updated and as accurate as of 03/03/17  No headache, visual changes, nausea, vomiting, diarrhea, constipation, dizziness, abdominal pain, skin rash, fevers, chills, night sweats, weight loss, swollen lymph nodes, body aches, joint swelling,  chest pain, shortness of breath, mood changes.  Positive muscle aches  Objective  Blood pressure 110/78, pulse 76, height 5' 4"  (1.626 m), weight 247 lb (112 kg), SpO2 98 %. Systems examined below as of 03/03/17   General: No apparent distress alert and oriented x3 mood and affect normal, dressed appropriately.  Patient has had noticeable weight loss HEENT: Pupils equal, extraocular movements intact  Respiratory: Patient's speak in full sentences and does not appear short of breath  Cardiovascular: No lower extremity edema, non tender, no erythema  Skin: Warm dry intact with no signs of infection or rash on extremities or on axial skeleton.  Abdomen: Soft nontender  Neuro: Cranial nerves II through XII are intact, neurovascularly intact in all extremities with 2+ DTRs and 2+ pulses.  Lymph: No lymphadenopathy of posterior or anterior cervical chain or axillae bilaterally.  Gait antalgic gait noted MSK: Mild tender with full range of motion and good stability and symmetric strength and tone of shoulders, elbows, wrist, hip, and ankles bilaterally.  Knee: Bilateral valgus deformity noted. Large thigh to calf ratio.  Tender to palpation over medial and PF joint line.  Less than previous exam ROM full in flexion and extension and lower leg rotation. instability with valgus force.  painful patellar compression. Patellar glide with moderate crepitus. Patellar and quadriceps tendons unremarkable. Hamstring and  quadriceps strength is normal.       Impression and Recommendations:     This case required medical decision making of moderate complexity.      Note: This dictation was prepared with Dragon dictation along with smaller phrase technology. Any transcriptional errors that result from this process are unintentional.

## 2017-03-03 NOTE — Patient Instructions (Signed)
I so proud of you  Heather [name] is your friend.  Keep it up  Continue the vitamins We will watch the B12 but I think you will do fine See me again in 6-8 weeks Happy New Year!

## 2017-03-03 NOTE — Assessment & Plan Note (Signed)
Doing relatively well at this time.  No change in mental formal physical therapy to increase activity.  Patient is motivated at this time and hopefully will continue to improve.  Follow-up with me again 6 weeks.

## 2017-03-05 ENCOUNTER — Other Ambulatory Visit: Payer: Self-pay | Admitting: Endocrinology

## 2017-03-08 ENCOUNTER — Ambulatory Visit (INDEPENDENT_AMBULATORY_CARE_PROVIDER_SITE_OTHER): Payer: Medicare Other | Admitting: Family Medicine

## 2017-03-08 VITALS — BP 117/73 | HR 74 | Temp 97.7°F | Ht 64.0 in | Wt 243.0 lb

## 2017-03-08 DIAGNOSIS — E119 Type 2 diabetes mellitus without complications: Secondary | ICD-10-CM | POA: Diagnosis not present

## 2017-03-08 DIAGNOSIS — Z6841 Body Mass Index (BMI) 40.0 and over, adult: Secondary | ICD-10-CM | POA: Diagnosis not present

## 2017-03-08 NOTE — Progress Notes (Signed)
Office: [phone]  /  Fax: [phone]   HPI:   Chief Complaint: OBESITY Heather [name] is here to discuss her progress with her obesity treatment plan. She is on the keep a food journal with 350 to 500 calories and 30+ grams of protein at supper daily and the Category 2 plan and is following her eating plan approximately 60 % of the time. She states she is exercising 0 minutes 0 times per week. Heather [name] has done very well with weight loss, even over the holidays. She did indulge some, but she worked to control her portions. Heather [name] is deviating from breakfast more. She is not exercising, but she start physical therapy for her knees next week. Her weight is 243 lb (110.2 kg) today and has had a weight loss of 3 pounds over a period of 3 weeks since her last visit. She has lost 25 lbs since starting treatment with [name].  Diabetes II Heather [name] has a diagnosis of diabetes type II. Heather [name] states she is not checking BGs at home and denies any hypoglycemic episodes. Last A1c was at 6.3 She has been working on intensive lifestyle modifications including diet, exercise, and weight loss to help control her blood glucose levels.  ALLERGIES: Allergies  Allergen Reactions  . Aspirin   . Coconut Oil   . Latex   . Lisinopril     REACTION: cough  . Metoprolol Itching  . Peanut-Containing Drug Products   . Penicillins     REACTION: rash  . Prednisone   . Strawberry Extract     MEDICATIONS: Current Outpatient Medications on File Prior to Visit  Medication Sig Dispense Refill  . acetaminophen (TYLENOL) 500 MG tablet Take 650 mg by mouth 3 (three) times daily as needed for pain.     [name] ALLEGRA-D ALLERGY & CONGESTION 180-240 MG 24 hr tablet TAKE 1 TABLET BY MOUTH DAILY 30 tablet 0  . Calcium Carbonate-Vitamin D (CALCIUM 600 + D PO) Take 2 tablets by mouth daily.      [name] CALCIUM PO Take by mouth. Plus Vitamin D    . clopidogrel (PLAVIX) 75 MG tablet Take 1 tablet (75 mg total) by mouth daily. 90 tablet 3  .  cyanocobalamin 1000 MCG tablet Take 1,000 mcg by mouth daily.    . Ferrous Sulfate (IRON) 325 (65 FE) MG TABS Take 1 tablet by mouth daily.      . furosemide (LASIX) 40 MG tablet TAKE 1 TABLET EVERY DAY 90 tablet 3  . gabapentin (NEURONTIN) 100 MG capsule TAKE 2 CAPSULES(200 MG) BY MOUTH AT BEDTIME 180 capsule 1  . glucosamine-chondroitin 500-400 MG tablet Take 1 tablet by mouth 2 (two) times daily.      [name] losartan (COZAAR) 25 MG tablet TAKE 1 TABLET EVERY DAY 90 tablet 3  . metFORMIN (GLUCOPHAGE-XR) 500 MG 24 hr tablet TAKE 2 TABLETS TWICE DAILY 360 tablet 3  . modafinil (PROVIGIL) 200 MG tablet Take 1 tablet (200 mg total) by mouth daily. 30 tablet 1  . Multiple Vitamin (MULTIVITAMIN) tablet Take 1 tablet by mouth daily.      . naproxen sodium (ANAPROX) 220 MG tablet Take 220 mg by mouth as needed.    . Omega-3 Fatty Acids (FISH OIL) 1000 MG CAPS Take 1 capsule by mouth daily.    [name] omeprazole (PRILOSEC) 20 MG capsule Take 20 mg by mouth daily as needed.     . potassium chloride SA (K-DUR,KLOR-CON) 20 MEQ tablet Take 1 tablet (20 mEq total) 2 (two) times  daily by mouth. 60 tablet 0  . Probiotic Product (PROBIOTIC-10) CAPS Take 1 capsule by mouth daily.    . pseudoephedrine-acetaminophen (TYLENOL SINUS) 30-500 MG TABS tablet Take 1 tablet by mouth as needed.    . rosuvastatin (CRESTOR) 40 MG tablet TAKE 1 TABLET EVERY DAY 90 tablet 3  . SF 5000 PLUS 1.1 % CREA dental cream   0  . SUMAtriptan (IMITREX) 20 MG/ACT nasal spray One spray (20 mg) in the nostril at the onset of migraine.May repeat in 2 hours if needed. No more than 2 sprays in a 24 hour period 12 Inhaler 1  . topiramate (TOPAMAX) 100 MG tablet TAKE 1 TABLET EVERY DAY 90 tablet 3  . traMADol (ULTRAM) 50 MG tablet Take 1 tablet (50 mg total) by mouth every 12 (twelve) hours as needed. 60 tablet 0  . Turmeric 500 MG TABS Take 2 tablets by mouth daily.    [name] venlafaxine XR (EFFEXOR-XR) 37.5 MG 24 hr capsule TAKE 1 CAPSULE (37.5 MG TOTAL) BY  MOUTH DAILY WITH BREAKFAST. 90 capsule 1  . vitamin E 200 UNIT capsule Take 200 Units by mouth daily.     Current Facility-Administered Medications on File Prior to Visit  Medication Dose Route Frequency Provider Last Rate Last Dose  . 0.9 %  sodium chloride infusion  500 mL Intravenous Continuous Nandigam, Venia Minks, MD        PAST MEDICAL HISTORY: Past Medical History:  Diagnosis Date  . Abnormal Pap smear   . ALLERGIC RHINITIS 10/22/2006  . ANEMIA 12/18/2008  . Arthritis   . ASYMPTOMATIC POSTMENOPAUSAL STATUS 11/22/2007  . Back pain   . Complication of anesthesia   . Degenerative arthritis   . DIABETES MELLITUS, TYPE II 01/13/2007  . Dyslipidemia   . Fibroid   . GERD 07/20/2007  . GOITER, MULTINODULAR 07/20/2007  . Headache(784.0) 07/20/2007  . HEARING LOSS 11/22/2007  . Hiatal hernia   . Hx of colposcopy with cervical biopsy   . HYPERCHOLESTEROLEMIA 01/13/2007  . Hyperglycemia   . HYPERTENSION 10/22/2006  . NASH (nonalcoholic steatohepatitis)   . Nocturnal hypoxemia 02/17/2013  . Obesity   . Obstructive sleep apnea   . OSTEOARTHRITIS 10/22/2006  . Other chronic nonalcoholic liver disease 3/76/2831  . Sleep apnea     PAST SURGICAL HISTORY: Past Surgical History:  Procedure Laterality Date  . BREAST SURGERY     Breast reduction  . CATARACT EXTRACTION, BILATERAL    . DEXA  08/2005  . DILATION AND CURETTAGE OF UTERUS    . ELECTROCARDIOGRAM  10/15/2006  . ESOPHAGOGASTRODUODENOSCOPY  12/08/2005  . Stress Cardiolite  10/21/2005  . sweat gland removal    . WISDOM TOOTH EXTRACTION      SOCIAL HISTORY: Social History   Tobacco Use  . Smoking status: Former Smoker    Packs/day: 2.00    Years: 20.00    Pack years: 40.00    Types: Cigarettes    Last attempt to quit: 03/03/1979    Years since quitting: 38.0  . Smokeless tobacco: Never Used  Substance Use Topics  . Alcohol use: Yes    Comment: rare  . Drug use: No    FAMILY HISTORY: Family History  Problem Relation  Age of Onset  . Asthma Mother   . Depression Mother   . Bipolar disorder Mother   . Dementia Mother   . Arthritis Mother   . Breast cancer Mother        primary  . Colon cancer Mother  mets from breast  . Cancer Mother   . Hypertension Father   . Migraines Father   . Cancer Maternal Aunt   . Heart disease Maternal Grandmother   . Cancer Maternal Grandfather     ROS: Review of Systems  Constitutional: Positive for weight loss.  Endo/Heme/Allergies:       Negative hypoglycemia    PHYSICAL EXAM: Blood pressure 117/73, pulse 74, temperature 97.7 F (36.5 C), temperature source Oral, height 5' 4"  (1.626 m), weight 243 lb (110.2 kg), SpO2 97 %. Body mass index is 41.71 kg/m. Physical Exam  Constitutional: She is oriented to person, place, and time. She appears well-developed and well-nourished.  Cardiovascular: Normal rate.  Pulmonary/Chest: Effort normal.  Musculoskeletal: Normal range of motion.  Neurological: She is oriented to person, place, and time.  Skin: Skin is warm and dry.  Psychiatric: She has a normal mood and affect. Her behavior is normal.  Vitals reviewed.   RECENT LABS AND TESTS: BMET    Component Value Date/Time   NA 142 12/23/2016 0956   K 3.6 12/23/2016 0956   CL 102 12/23/2016 0956   CO2 30 (H) 12/23/2016 0956   GLUCOSE 89 12/23/2016 0956   GLUCOSE 116 (H) 03/05/2016 0958   BUN 13 12/23/2016 0956   CREATININE 0.74 12/23/2016 0956   CREATININE 0.81 12/28/2011 0853   CALCIUM 9.3 12/23/2016 0956   GFRNONAA 82 12/23/2016 0956   GFRAA 95 12/23/2016 0956   Lab Results  Component Value Date   HGBA1C 6.3 (H) 12/23/2016   HGBA1C 6.3 09/08/2016   HGBA1C 6.4 03/05/2016   HGBA1C 5.9 08/14/2015   HGBA1C 6.0 02/15/2015   Lab Results  Component Value Date   INSULIN 20.9 12/23/2016   CBC    Component Value Date/Time   WBC 4.9 12/23/2016 0956   WBC 7.2 03/05/2016 0958   RBC 4.32 12/23/2016 0956   RBC 4.46 03/05/2016 0958   HGB 12.1  12/23/2016 0956   HCT 37.4 12/23/2016 0956   PLT 300.0 03/05/2016 0958   MCV 87 12/23/2016 0956   MCH 28.0 12/23/2016 0956   MCH 29.4 12/28/2011 0853   MCHC 32.4 12/23/2016 0956   MCHC 32.9 03/05/2016 0958   RDW 17.5 (H) 12/23/2016 0956   LYMPHSABS 1.2 12/23/2016 0956   MONOABS 0.4 03/05/2016 0958   EOSABS 0.1 12/23/2016 0956   BASOSABS 0.0 12/23/2016 0956   Iron/TIBC/Ferritin/ %Sat    Component Value Date/Time   IRON 38 12/23/2016 0956   TIBC 271 12/23/2016 0956   FERRITIN 40 12/23/2016 0956   IRONPCTSAT 14 (L) 12/23/2016 0956   Lipid Panel     Component Value Date/Time   CHOL 178 12/23/2016 0956   TRIG 80 12/23/2016 0956   HDL 81 12/23/2016 0956   CHOLHDL 3 03/05/2016 0958   VLDL 26.0 03/05/2016 0958   LDLCALC 81 12/23/2016 0956   Hepatic Function Panel     Component Value Date/Time   PROT 7.4 12/23/2016 0956   ALBUMIN 4.1 12/23/2016 0956   AST 17 12/23/2016 0956   ALT 13 12/23/2016 0956   ALKPHOS 71 12/23/2016 0956   BILITOT <0.2 12/23/2016 0956   BILIDIR 0.1 02/15/2015 1010   IBILI 0.2 12/28/2011 0853      Component Value Date/Time   TSH 1.170 12/23/2016 0956   TSH 1.76 03/05/2016 0958   TSH 1.86 02/15/2015 1010    ASSESSMENT AND PLAN: Type 2 diabetes mellitus without complication, without long-term current use of insulin (HCC)  Class 3 severe obesity  with serious comorbidity and body mass index (BMI) of 40.0 to 44.9 in adult, unspecified obesity type ([place])  PLAN:  Diabetes II Kinaya has been given extensive diabetes education by myself today including ideal fasting and post-prandial blood glucose readings, individual ideal Hgb A1c goals and hypoglycemia prevention. We discussed the importance of good blood sugar control to decrease the likelihood of diabetic complications such as nephropathy, neuropathy, limb loss, blindness, coronary artery disease, and death. We discussed the importance of intensive lifestyle modification including diet, exercise and  weight loss as the first line treatment for diabetes. Dany agrees to continue with diet and exercise. We will check labs in 1 month and she will follow up at the agreed upon time.  We spent > than 50% of the 15 minute visit on the counseling as documented in the note.  Obesity Heather [name] is currently in the action stage of change. As such, her goal is to continue with weight loss efforts She has agreed to follow the Category 2 plan + breakfast options Heather [name] has been instructed to work up to a goal of 150 minutes of combined cardio and strengthening exercise per week for weight loss and overall health benefits. We discussed the following Behavioral Modification Strategies today: better breakfast choices, decrease eating out and work on meal planning and easy cooking plans  Heather [name] has agreed to follow up with our clinic in 2 weeks. She was informed of the importance of frequent follow up visits to maximize her success with intensive lifestyle modifications for her multiple health conditions.     OBESITY BEHAVIORAL INTERVENTION VISIT  Today's visit was # 5 out of 22.  Starting weight: 268 lbs Starting date: 12/23/16 Today's weight : 243 lbs Today's date: 03/08/2017 Total lbs lost to date: 25 (Patients must lose 7 lbs in the first 6 months to continue with counseling)   ASK: We discussed the diagnosis of obesity with Heather [name] today and Heather [name] agreed to give [name] permission to discuss obesity behavioral modification therapy today.  ASSESS: Heather [name] has the diagnosis of obesity and her BMI today is 41.69 Heather [name] is in the action stage of change   ADVISE: Charrie was educated on the multiple health risks of obesity as well as the benefit of weight loss to improve her health. She was advised of the need for long term treatment and the importance of lifestyle modifications.  AGREE: Multiple dietary modification options and treatment options were discussed and  Heather [name] agreed to follow the Category  2 plan + breakfast options We discussed the following Behavioral Modification Strategies today: better breakfast options, decrease eating out and work on meal planning and easy cooking plans  I, Doreene Nest, am acting as transcriptionist for [name], MD  I have reviewed the above documentation for accuracy and completeness, and I agree with the above. -[name], MD

## 2017-03-11 ENCOUNTER — Ambulatory Visit: Payer: Medicare Other | Admitting: Endocrinology

## 2017-03-15 ENCOUNTER — Ambulatory Visit (INDEPENDENT_AMBULATORY_CARE_PROVIDER_SITE_OTHER): Payer: Medicare Other | Admitting: Endocrinology

## 2017-03-15 ENCOUNTER — Encounter: Payer: Self-pay | Admitting: Endocrinology

## 2017-03-15 VITALS — BP 94/59 | HR 91 | Wt 242.4 lb

## 2017-03-15 DIAGNOSIS — Z0000 Encounter for general adult medical examination without abnormal findings: Secondary | ICD-10-CM

## 2017-03-15 DIAGNOSIS — E119 Type 2 diabetes mellitus without complications: Secondary | ICD-10-CM

## 2017-03-15 LAB — POCT GLYCOSYLATED HEMOGLOBIN (HGB A1C): HEMOGLOBIN A1C: 5.9

## 2017-03-15 NOTE — Patient Instructions (Addendum)
Please stop taking the losartan. Please continue the same metformin. Please consider these measures for your health:  minimize alcohol.  Do not use tobacco products.  Have a colonoscopy at least every 10 years from age 71.  Women should have an annual mammogram from age 76.  Keep firearms safely stored.  Always use seat belts.  have working smoke alarms in your home.  See an eye doctor and dentist regularly.  Never drive under the influence of alcohol or drugs (including prescription drugs).   It is critically important to prevent falling down (keep floor areas well-lit, dry, and free of loose objects.  If you have a cane, walker, or wheelchair, you should use it, even for short trips around the house.  Wear flat-soled shoes.  Also, try not to rush).   Please come back for a follow-up appointment in 6 months.

## 2017-03-15 NOTE — Progress Notes (Signed)
we discussed code status.  pt requests full code, but would not want to be started or maintained on artificial life-support measures if there was not a reasonable chance of recovery 

## 2017-03-15 NOTE — Progress Notes (Signed)
Subjective:    Patient ID: Heather [name], female    DOB: 07-26-1945, 72 y.o.   MRN: [id number]  HPI Pt returns for f/u of diabetes mellitus: DM type: 2 Dx'ed: 0100 Complications: none Therapy: metformin.  GDM: never DKA: never Severe hypoglycemia: never. Pancreatitis: never. Other: she did not tolerate pioglitizone (edema); pt says she was told by surgeon she was not a good candidate for weight loss surgery, due to abd hernia; she does not check cbg's.  Interval history:pt states she feels well in general.  no cbg record, but states cbg's are well-controlled.   Past Medical History:  Diagnosis Date  . Abnormal Pap smear   . ALLERGIC RHINITIS 10/22/2006  . ANEMIA 12/18/2008  . Arthritis   . ASYMPTOMATIC POSTMENOPAUSAL STATUS 11/22/2007  . Back pain   . Complication of anesthesia   . Degenerative arthritis   . DIABETES MELLITUS, TYPE II 01/13/2007  . Dyslipidemia   . Fibroid   . GERD 07/20/2007  . GOITER, MULTINODULAR 07/20/2007  . Headache(784.0) 07/20/2007  . HEARING LOSS 11/22/2007  . Hiatal hernia   . Hx of colposcopy with cervical biopsy   . HYPERCHOLESTEROLEMIA 01/13/2007  . Hyperglycemia   . HYPERTENSION 10/22/2006  . NASH (nonalcoholic steatohepatitis)   . Nocturnal hypoxemia 02/17/2013  . Obesity   . Obstructive sleep apnea   . OSTEOARTHRITIS 10/22/2006  . Other chronic nonalcoholic liver disease 09/10/1973  . Sleep apnea     Past Surgical History:  Procedure Laterality Date  . BREAST SURGERY     Breast reduction  . CATARACT EXTRACTION, BILATERAL    . DEXA  08/2005  . DILATION AND CURETTAGE OF UTERUS    . ELECTROCARDIOGRAM  10/15/2006  . ESOPHAGOGASTRODUODENOSCOPY  12/08/2005  . Stress Cardiolite  10/21/2005  . sweat gland removal    . WISDOM TOOTH EXTRACTION      Social History   Socioeconomic History  . Marital status: Married    Spouse name: [name]  . Number of children: 2  . Years of education: College  . Highest education level: Not on file    Social Needs  . Financial resource strain: Not on file  . Food insecurity - worry: Not on file  . Food insecurity - inability: Not on file  . Transportation needs - medical: Not on file  . Transportation needs - non-medical: Not on file  Occupational History  . Occupation: Retired  Tobacco Use  . Smoking status: Former Smoker    Packs/day: 2.00    Years: 20.00    Pack years: 40.00    Types: Cigarettes    Last attempt to quit: 03/03/1979    Years since quitting: 38.0  . Smokeless tobacco: Never Used  Substance and Sexual Activity  . Alcohol use: Yes    Comment: rare  . Drug use: No  . Sexual activity: Yes    Birth control/protection: Surgical  Other Topics Concern  . Not on file  Social History Narrative   Patient is married [name]).   Patient has two children.   Patient is retired.   Patient has a college education.   Patient is right-handed.   Patient lives at home with family.   Caffeine Use: 2 soda every other day    Current Outpatient Medications on File Prior to Visit  Medication Sig Dispense Refill  . acetaminophen (TYLENOL) 500 MG tablet Take 650 mg by mouth 3 (three) times daily as needed for pain.     [name] ALLEGRA-D ALLERGY & CONGESTION  180-240 MG 24 hr tablet TAKE 1 TABLET BY MOUTH DAILY 30 tablet 0  . Calcium Carbonate-Vitamin D (CALCIUM 600 + D PO) Take 2 tablets by mouth daily.      [name] CALCIUM PO Take by mouth. Plus Vitamin D    . clopidogrel (PLAVIX) 75 MG tablet Take 1 tablet (75 mg total) by mouth daily. 90 tablet 3  . cyanocobalamin 1000 MCG tablet Take 1,000 mcg by mouth daily.    . Ferrous Sulfate (IRON) 325 (65 FE) MG TABS Take 1 tablet by mouth daily.      . furosemide (LASIX) 40 MG tablet TAKE 1 TABLET EVERY DAY 90 tablet 3  . gabapentin (NEURONTIN) 100 MG capsule TAKE 2 CAPSULES(200 MG) BY MOUTH AT BEDTIME 180 capsule 1  . glucosamine-chondroitin 500-400 MG tablet Take 1 tablet by mouth 2 (two) times daily.      . metFORMIN (GLUCOPHAGE-XR) 500 MG 24  hr tablet TAKE 2 TABLETS TWICE DAILY 360 tablet 3  . modafinil (PROVIGIL) 200 MG tablet Take 1 tablet (200 mg total) by mouth daily. 30 tablet 1  . Multiple Vitamin (MULTIVITAMIN) tablet Take 1 tablet by mouth daily.      . naproxen sodium (ANAPROX) 220 MG tablet Take 220 mg by mouth as needed.    . Omega-3 Fatty Acids (FISH OIL) 1000 MG CAPS Take 1 capsule by mouth daily.    [name] omeprazole (PRILOSEC) 20 MG capsule Take 20 mg by mouth daily as needed.     . potassium chloride SA (K-DUR,KLOR-CON) 20 MEQ tablet Take 1 tablet (20 mEq total) 2 (two) times daily by mouth. 60 tablet 0  . Probiotic Product (PROBIOTIC-10) CAPS Take 1 capsule by mouth daily.    . pseudoephedrine-acetaminophen (TYLENOL SINUS) 30-500 MG TABS tablet Take 1 tablet by mouth as needed.    . pyridOXINE (VITAMIN B-6) 100 MG tablet Take 200 mg by mouth daily.    . rosuvastatin (CRESTOR) 40 MG tablet TAKE 1 TABLET EVERY DAY 90 tablet 3  . SF 5000 PLUS 1.1 % CREA dental cream   0  . SUMAtriptan (IMITREX) 20 MG/ACT nasal spray One spray (20 mg) in the nostril at the onset of migraine.May repeat in 2 hours if needed. No more than 2 sprays in a 24 hour period 12 Inhaler 1  . topiramate (TOPAMAX) 100 MG tablet TAKE 1 TABLET EVERY DAY 90 tablet 3  . traMADol (ULTRAM) 50 MG tablet Take 1 tablet (50 mg total) by mouth every 12 (twelve) hours as needed. 60 tablet 0  . Turmeric 500 MG TABS Take 2 tablets by mouth daily.    [name] venlafaxine XR (EFFEXOR-XR) 37.5 MG 24 hr capsule TAKE 1 CAPSULE (37.5 MG TOTAL) BY MOUTH DAILY WITH BREAKFAST. 90 capsule 1  . vitamin E 200 UNIT capsule Take 200 Units by mouth daily.     Current Facility-Administered Medications on File Prior to Visit  Medication Dose Route Frequency Provider Last Rate Last Dose  . 0.9 %  sodium chloride infusion  500 mL Intravenous Continuous Nandigam, Venia Minks, MD        Allergies  Allergen Reactions  . Aspirin   . Coconut Oil   . Latex   . Lisinopril     REACTION: cough    . Metoprolol Itching  . Peanut-Containing Drug Products   . Penicillins     REACTION: rash  . Prednisone   . Strawberry Extract     Family History  Problem Relation Age of Onset  .  Asthma Mother   . Depression Mother   . Bipolar disorder Mother   . Dementia Mother   . Arthritis Mother   . Breast cancer Mother        primary  . Colon cancer Mother        mets from breast  . Cancer Mother   . Hypertension Father   . Migraines Father   . Cancer Maternal Aunt   . Heart disease Maternal Grandmother   . Cancer Maternal Grandfather     BP (!) 94/59 (BP Location: Left Arm, Patient Position: Sitting, Cuff Size: Normal)   Pulse 91   Wt 242 lb 6.4 oz (110 kg)   SpO2 97%   BMI 41.61 kg/m    Review of Systems She has lost 28 lbs.      Objective:   Physical Exam VITAL SIGNS:  See vs page.  GENERAL: no distress.  Pulses: dorsalis pedis intact bilat.   MSK: no deformity of the feet.  CV: 1+ bilat leg edema.   Skin:  no ulcer on the feet, but the skin is dry,and there are heavy calluses.  normal color and temp on the feet.  Neuro: sensation is intact to touch on the feet  Lab Results  Component Value Date   CREATININE 0.74 12/23/2016   BUN 13 12/23/2016   NA 142 12/23/2016   K 3.6 12/23/2016   CL 102 12/23/2016   CO2 30 (H) 12/23/2016       Assessment & Plan:  Type 2 DM: well-controlled HTN: overcontrolled  Subjective:   Patient here for Medicare annual wellness visit and management of other chronic and acute problems.     Risk factors: advanced age    62 of Physicians Providing Medical Care to Patient:  See "snapshot"   Activities of Daily Living: In your present state of health, do you have any difficulty performing the following activities (lives with husband)?:  Preparing food and eating?: No  Bathing yourself: No  Getting dressed: No  Using the toilet:No  Moving around from place to place: No  In the past year have you fallen or had a near  fall?:No    Home Safety: Has smoke detector and wears seat belts. No firearms.    Opioid Use: none   Diet and Exercise  Current exercise habits: pt says not good.  Dietary issues discussed: pt says diet is much better   Depression Screen  Q1: Over the past two weeks, have you felt down, depressed or hopeless? no  Q2: Over the past two weeks, have you felt little interest or pleasure in doing things? no   The following portions of the patient's history were reviewed and updated as appropriate: allergies, current medications, past family history, past medical history, past social history, past surgical history and problem list.   Review of Systems  Denies hearing loss, and visual loss Objective:   Vision:  Advertising account executive, so she declines VA today Hearing: grossly normal.   Body mass index:  See vs page Msk: pt easily and quickly performs "get-up-and-go" from a sitting position Cognitive Impairment Assessment: cognition, memory and judgment appear normal.  remembers 3/3 at 5 minutes.  excellent recall.  can easily read and write a sentence.  alert and oriented x 3.      Assessment:   Medicare wellness utd on preventive parameters    Plan:   During the course of the visit the patient was educated and counseled about appropriate screening and preventive services  including:        Fall prevention is advised today  Screening mammography is UTD Bone densitometry screening is UTD Diabetes screening is UTD Nutrition counseling is offered  advanced directives/end of life addressed today:  see healthcare directives hyperlink  Vaccines are updated as needed  Patient Instructions (the written plan) was given to the patient.

## 2017-03-16 ENCOUNTER — Ambulatory Visit: Payer: Medicare Other | Attending: Family Medicine

## 2017-03-16 DIAGNOSIS — G8929 Other chronic pain: Secondary | ICD-10-CM | POA: Insufficient documentation

## 2017-03-16 DIAGNOSIS — R2681 Unsteadiness on feet: Secondary | ICD-10-CM | POA: Insufficient documentation

## 2017-03-16 DIAGNOSIS — M25662 Stiffness of left knee, not elsewhere classified: Secondary | ICD-10-CM | POA: Diagnosis not present

## 2017-03-16 DIAGNOSIS — M6281 Muscle weakness (generalized): Secondary | ICD-10-CM

## 2017-03-16 DIAGNOSIS — M25569 Pain in unspecified knee: Secondary | ICD-10-CM | POA: Diagnosis not present

## 2017-03-16 DIAGNOSIS — R262 Difficulty in walking, not elsewhere classified: Secondary | ICD-10-CM | POA: Insufficient documentation

## 2017-03-16 DIAGNOSIS — M25661 Stiffness of right knee, not elsewhere classified: Secondary | ICD-10-CM

## 2017-03-16 NOTE — Therapy (Signed)
[place], [place], [address] Phone: [phone]   Fax:  [phone]  Physical Therapy Evaluation  Patient Details  Name: Heather [name] MRN: [id number] Date of Birth: [birth year] Referring Provider: Hulan Saas DO   Encounter Date: 03/16/2017  PT End of Session - 03/16/17 1314    Visit Number  1    Number of Visits  12    Date for PT Re-Evaluation  04/23/17    Authorization Type  MCR    PT Start Time  1100    PT Stop Time  1145    PT Time Calculation (min)  45 min    Activity Tolerance  Patient tolerated treatment well;No increased pain    Behavior During Therapy  WFL for tasks assessed/performed       Past Medical History:  Diagnosis Date  . Abnormal Pap smear   . ALLERGIC RHINITIS 10/22/2006  . ANEMIA 12/18/2008  . Arthritis   . ASYMPTOMATIC POSTMENOPAUSAL STATUS 11/22/2007  . Back pain   . Complication of anesthesia   . Degenerative arthritis   . DIABETES MELLITUS, TYPE II 01/13/2007  . Dyslipidemia   . Fibroid   . GERD 07/20/2007  . GOITER, MULTINODULAR 07/20/2007  . Headache(784.0) 07/20/2007  . HEARING LOSS 11/22/2007  . Hiatal hernia   . Hx of colposcopy with cervical biopsy   . HYPERCHOLESTEROLEMIA 01/13/2007  . Hyperglycemia   . HYPERTENSION 10/22/2006  . NASH (nonalcoholic steatohepatitis)   . Nocturnal hypoxemia 02/17/2013  . Obesity   . Obstructive sleep apnea   . OSTEOARTHRITIS 10/22/2006  . Other chronic nonalcoholic liver disease 6/75/4492  . Sleep apnea     Past Surgical History:  Procedure Laterality Date  . BREAST SURGERY     Breast reduction  . CATARACT EXTRACTION, BILATERAL    . DEXA  08/2005  . DILATION AND CURETTAGE OF UTERUS    . ELECTROCARDIOGRAM  10/15/2006  . ESOPHAGOGASTRODUODENOSCOPY  12/08/2005  . Stress Cardiolite  10/21/2005  . sweat gland removal    . WISDOM TOOTH EXTRACTION      There were no vitals filed for this visit.   Subjective  Assessment - 03/16/17 1107    Subjective  She reports chronic knee OA . Wants to start exerciseing.  She was doing water exercises but is not doing this now due to cold weather. RT leg takes less load.   Not doing HEP from previous episode of PT care    Limitations  Standing;Walking getting out of chair    How long can you sit comfortably?  as needed    How long can you stand comfortably?  70mn    How long can you walk comfortably?  in last month 3 hours    Diagnostic tests  Xray;      Patient Stated Goals  move better     Currently in Pain?  Yes    Pain Score  1  inlast past month    5/10    Pain Location  Knee    Pain Orientation  Right;Left;Anterior    Pain Descriptors / Indicators  Penetrating;Stabbing;Tightness    Pain Type  Chronic pain    Pain Onset  More than a month ago    Pain Frequency  Intermittent    Aggravating Factors   weight bearing activity    Pain Relieving Factors  rest, water exer, aspercreame, heat         [facility] PT Assessment - 03/16/17 0001  Assessment   Medical Diagnosis  bilateral OA knees    Referring Provider  Hulan Saas DO    Onset Date/Surgical Date  -- 20 years ago    Next MD Visit  2 months    Prior Therapy  2 years ago.       Precautions   Precautions  Fall    Precaution Comments  uses cane for prolonged walking  and RW if needed      Balance Screen   Has the patient fallen in the past 6 months  No    Has the patient had a decrease in activity level because of a fear of falling?   No    Is the patient reluctant to leave their home because of a fear of falling?   No      Home Social worker  Private residence    Home Access  Stairs to enter    Entrance Stairs-Number of Steps  3    Entrance Stairs-Rails  Right;Left;Can reach both    [place]  Two level    Alternate Level Stairs-Number of Steps  14    Alternate [place] - single point;Walker - 4 wheels      Prior  Function   Level of Independence  Needs assistance with ADLs;Needs assistance with homemaking      Cognition   Overall Cognitive Status  Within Functional Limits for tasks assessed      Observation/Other Assessments   Focus on Therapeutic Outcomes (FOTO)   66% limited      ROM / Strength   AROM / PROM / Strength  AROM;Strength      AROM   AROM Assessment Site  Knee    Right/Left Knee  Right;Left    Right Knee Extension  -15    Right Knee Flexion  110    Left Knee Extension  -10    Left Knee Flexion  110      Strength   Overall Strength Comments  poor core strength    Strength Assessment Site  Knee;Hip    Right/Left Hip  Right;Left    Right Hip Flexion  4/5    Right Hip Extension  4/5    Right Hip External Rotation   4-/5    Right Hip Internal Rotation  4/5    Right Hip ABduction  3+/5    Left Hip Flexion  3+/5    Left Hip Extension  4/5    Left Hip External Rotation  4-/5    Left Hip Internal Rotation  4/5    Left Hip ABduction  3/5    Right/Left Knee  Right;Left    Right Knee Flexion  4+/5    Right Knee Extension  4+/5    Left Knee Flexion  4+/5    Left Knee Extension  4+/5      Standardized Balance Assessment   Standardized Balance Assessment  Berg Balance Test      Berg Balance Test   Sit to Stand  Able to stand  independently using hands    Standing Unsupported  Able to stand safely 2 minutes    Sitting with Back Unsupported but Feet Supported on Floor or Stool  Able to sit safely and securely 2 minutes    Stand to Sit  Controls descent by using hands    Transfers  Able to transfer safely, definite need of hands    Standing Unsupported with  Eyes Closed  Able to stand 10 seconds with supervision    Standing Ubsupported with Feet Together  Able to place feet together independently and stand 1 minute safely    From Standing, Reach Forward with Outstretched Arm  Can reach confidently >25 cm (10")    From Standing Position, Pick up Object from Floor  Able to pick up  shoe, needs supervision    From Standing Position, Turn to Look Behind Over each Shoulder  Looks behind from both sides and weight shifts well    Turn 360 Degrees  Able to turn 360 degrees safely one side only in 4 seconds or less    Standing Unsupported, Alternately Place Feet on Step/Stool  Able to stand independently and complete 8 steps >20 seconds    Standing Unsupported, One Foot in [place] to plae foot ahead of the other independently and hold 30 seconds    Standing on One Leg  Tries to lift leg/unable to hold 3 seconds but remains standing independently    Total Score  45    Berg comment:  discussed score with client             Objective measurements completed on examination: See above findings.              PT Education - 03/16/17 1313    Education provided  Yes    Education Details  POC , results of testing and BERG score indications to use SPC in and out of home for decr risk of fall    Person(s) Educated  Patient    Methods  Explanation    Comprehension  Verbalized understanding       PT Short Term Goals - 03/16/17 1318      PT SHORT TERM GOAL #1   Title  independent with initial HEP    Time  3    Period  Weeks    Status  New      PT SHORT TERM GOAL #2   Title  Report pain decrease at rest from 6/10 to 3/10    Time  3    Period  Weeks    Status  New        PT Long Term Goals - 03/16/17 1318      PT LONG TERM GOAL #1   Title  "Pt will be independent with advanced HEP    Time  6    Period  Weeks    Status  New      PT LONG TERM GOAL #2   Title  "FOTO will improve from 66% limitation  to  54%  limited or less indicating improved functional mobility    Time  6    Period  Weeks    Status  New      PT LONG TERM GOAL #3   Title  BERG score witll improve to 50/56 to decr risk of fall    Period  Weeks    Status  New      PT LONG TERM GOAL #4   Title  Pt will increase R hip and knee AROM in order to don socks with pain level 2/10 or  less    Time  6    Period  Weeks    Status  New      PT LONG TERM GOAL #5   Title  Pt will demonstrate sit to stand form chair without using compensatory motions(blocking knee, momentum) x 5  Time  6    Period  Weeks    Status  New             Plan - 03/16/17 1315    Clinical Impression Statement  MS Galentine presents with report of chronic knee pain and general weakness limiting mobility. She demo weakness of both LE and difficulty with narrow BOS balance and getting in and out of chair. She needs SPC to decr risk of fall .   She needs hip strengthening to max mobility independence   More recent xrays may give some info on joint progression and if she can make any significant improvements    Clinical Presentation  Stable    Clinical Decision Making  Low    Rehab Potential  Good    PT Frequency  2x / week    PT Duration  6 weeks    PT Treatment/Interventions  Cryotherapy;Moist Heat;Iontophoresis 46m/ml Dexamethasone;Ultrasound;Therapeutic exercise;Manual techniques;Patient/family education;Functional mobility training    PT Next Visit Plan  HEP for strength in hips and quads. , balance exercises. heat as needed    Consulted and Agree with Plan of Care  Patient       Patient will benefit from skilled therapeutic intervention in order to improve the following deficits and impairments:  Pain, Obesity, Difficulty walking, Increased edema, Decreased balance, Decreased activity tolerance, Decreased range of motion, Decreased strength, Decreased endurance, Postural dysfunction  Visit Diagnosis: Chronic knee pain, unspecified laterality     Problem List Patient Active Problem List   Diagnosis Date Noted  . Type 2 diabetes mellitus without complication, without long-term current use of insulin ([place] 02/15/2017  . Bilateral leg cramps 01/05/2017  . High serum vitamin B12 01/05/2017  . Class 3 severe obesity with serious comorbidity and body mass index (BMI) of 40.0 to 44.9 in  adult ([facility] [place] 01/05/2017  . Peripheral neuropathy 05/27/2016  . Screen for colon cancer 04/02/2016  . Elevated sed rate 03/05/2016  . RLS (restless legs syndrome) 02/12/2016  . Muscle spasm of both lower legs 02/12/2016  . Edema of both ankles 02/12/2016  . Iron deficiency anemia 02/12/2016  . Hypokalemia 02/12/2016  . Pernicious anemia 02/12/2016  . Degenerative disc disease, lumbar 04/09/2015  . Degenerative arthritis of knee, bilateral 03/13/2015  . Chronic low back pain 03/13/2015  . Diabetes ([place] 02/15/2015  . Loss of weight 08/01/2014  . Coronary atherosclerosis of native coronary artery 10/31/2013  . Incidental lung nodule, > 342mand < 81m681mLeft lower lobe 7mm86mJune 2014, April 2015 unchanged 06/25/2013  . OSA on CPAP 06/21/2013  . 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 Myalgia and myositis 05/02/2013  . Muscle ache 04/03/2013  . Nocturnal hypoxemia 02/17/2013  . Morbid obesity (HCC)[place]/25/2014  . Hiatal hernia seen on CT 08/24/2012  .  LLL nodule 6mm 52mune 2014, described in 2003 07/13/2012  . Asthma, chronic 06/29/2012  . Arthralgia 06/02/2012  . Unspecified asthma(493.90) 06/02/2012  . Hypersomnia with sleep apnea, unspecified 03/11/2012  . Other alteration of consciousness 03/11/2012  . Migraine without aura 03/11/2012  . Encounter for long-term (current) use of other medications 12/28/2011  . Dyspnea 12/28/2011  . Screening examination for infectious disease 12/28/2011  . Hx of fibroids 08/03/2011  . Knee pain, left 12/25/2010  . NECK PAIN 12/03/2009  . EDEMA 12/18/2008  . TINNITUS 11/22/2007  . HEARING LOSS 11/22/2007  . KNEE PAIN, RIGHT 11/22/2007  . ASYMPTOMATIC POSTMENOPAUSAL STATUS 11/22/2007  . GOITER, MULTINODULAR 07/20/2007  . GERD 07/20/2007  .  NASH (nonalcoholic steatohepatitis) 07/20/2007  . Headache 07/20/2007  . HYPERCHOLESTEROLEMIA 01/13/2007  . ANXIETY 10/22/2006  . Essential hypertension 10/22/2006  . ALLERGIC RHINITIS 10/22/2006  .  Osteoarthritis 10/22/2006    Darrel Hoover  PT 03/16/2017, 1:25 PM  [facility] [address] [place], [place], [address] Phone: [phone]   Fax:  [phone]  Name: Heather [name] MRN: [id number] Date of Birth: 1945/05/19

## 2017-03-23 ENCOUNTER — Ambulatory Visit (INDEPENDENT_AMBULATORY_CARE_PROVIDER_SITE_OTHER): Payer: Medicare Other | Admitting: Family Medicine

## 2017-03-23 ENCOUNTER — Ambulatory Visit: Payer: Medicare Other

## 2017-03-23 VITALS — BP 97/65 | HR 97 | Temp 98.1°F | Ht 64.0 in | Wt 235.0 lb

## 2017-03-23 DIAGNOSIS — I10 Essential (primary) hypertension: Secondary | ICD-10-CM | POA: Diagnosis not present

## 2017-03-23 DIAGNOSIS — R2681 Unsteadiness on feet: Secondary | ICD-10-CM

## 2017-03-23 DIAGNOSIS — M25662 Stiffness of left knee, not elsewhere classified: Secondary | ICD-10-CM | POA: Diagnosis not present

## 2017-03-23 DIAGNOSIS — G8929 Other chronic pain: Secondary | ICD-10-CM

## 2017-03-23 DIAGNOSIS — M25569 Pain in unspecified knee: Principal | ICD-10-CM

## 2017-03-23 DIAGNOSIS — Z6841 Body Mass Index (BMI) 40.0 and over, adult: Secondary | ICD-10-CM

## 2017-03-23 DIAGNOSIS — E119 Type 2 diabetes mellitus without complications: Secondary | ICD-10-CM | POA: Diagnosis not present

## 2017-03-23 DIAGNOSIS — M6281 Muscle weakness (generalized): Secondary | ICD-10-CM

## 2017-03-23 DIAGNOSIS — M25661 Stiffness of right knee, not elsewhere classified: Secondary | ICD-10-CM

## 2017-03-23 DIAGNOSIS — R262 Difficulty in walking, not elsewhere classified: Secondary | ICD-10-CM

## 2017-03-23 NOTE — Progress Notes (Signed)
Office: [phone]  /  Fax: [phone]   HPI:   Chief Complaint: OBESITY Heather [name] is here to discuss her progress with her obesity treatment plan. She is on the Category 2 plan with breakfast options and is following her eating plan approximately 90 % of the time. She states she is exercising 0 minutes 0 times per week. Heather [name] continues to do well with weight loss. She states she is not hungry and is back to skipping meals, especially for breakfast which means her calories are going too low. She notes notes eating all of her lunch and dinner but now wants to snack more.  Her weight is 235 lb (106.6 kg) today and has had a weight loss of 8 pounds over a period of 2 weeks since her last visit. She has lost 33 lbs since starting treatment with [name].  Hypertension Heather [name] is a 72 y.o. female with hypertension. Heather [name]'s blood pressure is low. She saw her primary care physician for her physical, who stopped her blood pressure medications but she hasn't stopped yet. She denies chest pain or headache. She is working weight loss to help control her blood pressure with the goal of decreasing her risk of heart attack and stroke. Heather [name]'s blood pressure is not currently controlled.  Diabetes II Heather [name] has a diagnosis of diabetes type II. Heather [name]'s A1c decreased from 6.3 to 5.9. She denies hypoglycemia on metformin and she denies nausea or vomiting. She did not bring in BGs log today. She has been working on intensive lifestyle modifications including diet, exercise, and weight loss to help control her blood glucose levels.  ALLERGIES: Allergies  Allergen Reactions  . Aspirin   . Coconut Oil   . Latex   . Lisinopril     REACTION: cough  . Metoprolol Itching  . Peanut-Containing Drug Products   . Penicillins     REACTION: rash  . Prednisone   . Strawberry Extract     MEDICATIONS: Current Outpatient Medications on File Prior to Visit  Medication Sig Dispense Refill  . acetaminophen  (TYLENOL) 500 MG tablet Take 650 mg by mouth 3 (three) times daily as needed for pain.     [name] ALLEGRA-D ALLERGY & CONGESTION 180-240 MG 24 hr tablet TAKE 1 TABLET BY MOUTH DAILY 30 tablet 0  . Calcium Carbonate-Vitamin D (CALCIUM 600 + D PO) Take 2 tablets by mouth daily.      [name] CALCIUM PO Take by mouth. Plus Vitamin D    . clopidogrel (PLAVIX) 75 MG tablet Take 1 tablet (75 mg total) by mouth daily. 90 tablet 3  . cyanocobalamin 1000 MCG tablet Take 1,000 mcg by mouth daily.    . Ferrous Sulfate (IRON) 325 (65 FE) MG TABS Take 1 tablet by mouth daily.      . furosemide (LASIX) 40 MG tablet TAKE 1 TABLET EVERY DAY 90 tablet 3  . gabapentin (NEURONTIN) 100 MG capsule TAKE 2 CAPSULES(200 MG) BY MOUTH AT BEDTIME 180 capsule 1  . glucosamine-chondroitin 500-400 MG tablet Take 1 tablet by mouth 2 (two) times daily.      . metFORMIN (GLUCOPHAGE-XR) 500 MG 24 hr tablet TAKE 2 TABLETS TWICE DAILY 360 tablet 3  . modafinil (PROVIGIL) 200 MG tablet Take 1 tablet (200 mg total) by mouth daily. 30 tablet 1  . Multiple Vitamin (MULTIVITAMIN) tablet Take 1 tablet by mouth daily.      . naproxen sodium (ANAPROX) 220 MG tablet Take 220 mg by mouth as needed.    [name]  Omega-3 Fatty Acids (FISH OIL) 1000 MG CAPS Take 1 capsule by mouth daily.    [name] omeprazole (PRILOSEC) 20 MG capsule Take 20 mg by mouth daily as needed.     . potassium chloride SA (K-DUR,KLOR-CON) 20 MEQ tablet Take 1 tablet (20 mEq total) 2 (two) times daily by mouth. 60 tablet 0  . Probiotic Product (PROBIOTIC-10) CAPS Take 1 capsule by mouth daily.    . pseudoephedrine-acetaminophen (TYLENOL SINUS) 30-500 MG TABS tablet Take 1 tablet by mouth as needed.    . pyridOXINE (VITAMIN B-6) 100 MG tablet Take 200 mg by mouth daily.    . rosuvastatin (CRESTOR) 40 MG tablet TAKE 1 TABLET EVERY DAY 90 tablet 3  . SF 5000 PLUS 1.1 % CREA dental cream   0  . SUMAtriptan (IMITREX) 20 MG/ACT nasal spray One spray (20 mg) in the nostril at the onset of  migraine.May repeat in 2 hours if needed. No more than 2 sprays in a 24 hour period 12 Inhaler 1  . topiramate (TOPAMAX) 100 MG tablet TAKE 1 TABLET EVERY DAY 90 tablet 3  . traMADol (ULTRAM) 50 MG tablet Take 1 tablet (50 mg total) by mouth every 12 (twelve) hours as needed. 60 tablet 0  . Turmeric 500 MG TABS Take 2 tablets by mouth daily.    [name] venlafaxine XR (EFFEXOR-XR) 37.5 MG 24 hr capsule TAKE 1 CAPSULE (37.5 MG TOTAL) BY MOUTH DAILY WITH BREAKFAST. 90 capsule 1  . vitamin E 200 UNIT capsule Take 200 Units by mouth daily.     Current Facility-Administered Medications on File Prior to Visit  Medication Dose Route Frequency Provider Last Rate Last Dose  . 0.9 %  sodium chloride infusion  500 mL Intravenous Continuous Nandigam, Venia Minks, MD        PAST MEDICAL HISTORY: Past Medical History:  Diagnosis Date  . Abnormal Pap smear   . ALLERGIC RHINITIS 10/22/2006  . ANEMIA 12/18/2008  . Arthritis   . ASYMPTOMATIC POSTMENOPAUSAL STATUS 11/22/2007  . Back pain   . Complication of anesthesia   . Degenerative arthritis   . DIABETES MELLITUS, TYPE II 01/13/2007  . Dyslipidemia   . Fibroid   . GERD 07/20/2007  . GOITER, MULTINODULAR 07/20/2007  . Headache(784.0) 07/20/2007  . HEARING LOSS 11/22/2007  . Hiatal hernia   . Hx of colposcopy with cervical biopsy   . HYPERCHOLESTEROLEMIA 01/13/2007  . Hyperglycemia   . HYPERTENSION 10/22/2006  . NASH (nonalcoholic steatohepatitis)   . Nocturnal hypoxemia 02/17/2013  . Obesity   . Obstructive sleep apnea   . OSTEOARTHRITIS 10/22/2006  . Other chronic nonalcoholic liver disease 5/40/9811  . Sleep apnea     PAST SURGICAL HISTORY: Past Surgical History:  Procedure Laterality Date  . BREAST SURGERY     Breast reduction  . CATARACT EXTRACTION, BILATERAL    . DEXA  08/2005  . DILATION AND CURETTAGE OF UTERUS    . ELECTROCARDIOGRAM  10/15/2006  . ESOPHAGOGASTRODUODENOSCOPY  12/08/2005  . Stress Cardiolite  10/21/2005  . sweat gland removal     . WISDOM TOOTH EXTRACTION      SOCIAL HISTORY: Social History   Tobacco Use  . Smoking status: Former Smoker    Packs/day: 2.00    Years: 20.00    Pack years: 40.00    Types: Cigarettes    Last attempt to quit: 03/03/1979    Years since quitting: 38.0  . Smokeless tobacco: Never Used  Substance Use Topics  . Alcohol use: Yes  Comment: rare  . Drug use: No    FAMILY HISTORY: Family History  Problem Relation Age of Onset  . Asthma Mother   . Depression Mother   . Bipolar disorder Mother   . Dementia Mother   . Arthritis Mother   . Breast cancer Mother        primary  . Colon cancer Mother        mets from breast  . Cancer Mother   . Hypertension Father   . Migraines Father   . Cancer Maternal Aunt   . Heart disease Maternal Grandmother   . Cancer Maternal Grandfather     ROS: Review of Systems  Constitutional: Positive for weight loss.  Cardiovascular: Negative for chest pain.  Neurological: Negative for headaches.  Endo/Heme/Allergies:       Negative hypoglycemia    PHYSICAL EXAM: Blood pressure 97/65, pulse 97, temperature 98.1 F (36.7 C), temperature source Oral, height 5' 4"  (1.626 m), weight 235 lb (106.6 kg), SpO2 98 %. Body mass index is 40.34 kg/m. Physical Exam  Constitutional: She is oriented to person, place, and time. She appears well-developed and well-nourished.  Cardiovascular: Normal rate.  Pulmonary/Chest: Effort normal.  Musculoskeletal: Normal range of motion.  Neurological: She is oriented to person, place, and time.  Skin: Skin is warm and dry.  Psychiatric: She has a normal mood and affect. Her behavior is normal.  Vitals reviewed.   RECENT LABS AND TESTS: BMET    Component Value Date/Time   NA 142 12/23/2016 0956   K 3.6 12/23/2016 0956   CL 102 12/23/2016 0956   CO2 30 (H) 12/23/2016 0956   GLUCOSE 89 12/23/2016 0956   GLUCOSE 116 (H) 03/05/2016 0958   BUN 13 12/23/2016 0956   CREATININE 0.74 12/23/2016 0956    CREATININE 0.81 12/28/2011 0853   CALCIUM 9.3 12/23/2016 0956   GFRNONAA 82 12/23/2016 0956   GFRAA 95 12/23/2016 0956   Lab Results  Component Value Date   HGBA1C 5.9 03/15/2017   HGBA1C 6.3 (H) 12/23/2016   HGBA1C 6.3 09/08/2016   HGBA1C 6.4 03/05/2016   HGBA1C 5.9 08/14/2015   Lab Results  Component Value Date   INSULIN 20.9 12/23/2016   CBC    Component Value Date/Time   WBC 4.9 12/23/2016 0956   WBC 7.2 03/05/2016 0958   RBC 4.32 12/23/2016 0956   RBC 4.46 03/05/2016 0958   HGB 12.1 12/23/2016 0956   HCT 37.4 12/23/2016 0956   PLT 300.0 03/05/2016 0958   MCV 87 12/23/2016 0956   MCH 28.0 12/23/2016 0956   MCH 29.4 12/28/2011 0853   MCHC 32.4 12/23/2016 0956   MCHC 32.9 03/05/2016 0958   RDW 17.5 (H) 12/23/2016 0956   LYMPHSABS 1.2 12/23/2016 0956   MONOABS 0.4 03/05/2016 0958   EOSABS 0.1 12/23/2016 0956   BASOSABS 0.0 12/23/2016 0956   Iron/TIBC/Ferritin/ %Sat    Component Value Date/Time   IRON 38 12/23/2016 0956   TIBC 271 12/23/2016 0956   FERRITIN 40 12/23/2016 0956   IRONPCTSAT 14 (L) 12/23/2016 0956   Lipid Panel     Component Value Date/Time   CHOL 178 12/23/2016 0956   TRIG 80 12/23/2016 0956   HDL 81 12/23/2016 0956   CHOLHDL 3 03/05/2016 0958   VLDL 26.0 03/05/2016 0958   LDLCALC 81 12/23/2016 0956   Hepatic Function Panel     Component Value Date/Time   PROT 7.4 12/23/2016 0956   ALBUMIN 4.1 12/23/2016 0956   AST 17  12/23/2016 0956   ALT 13 12/23/2016 0956   ALKPHOS 71 12/23/2016 0956   BILITOT <0.2 12/23/2016 0956   BILIDIR 0.1 02/15/2015 1010   IBILI 0.2 12/28/2011 0853      Component Value Date/Time   TSH 1.170 12/23/2016 0956   TSH 1.76 03/05/2016 0958   TSH 1.86 02/15/2015 1010    ASSESSMENT AND PLAN: Essential hypertension  Type 2 diabetes mellitus without complication, without long-term current use of insulin (HCC)  Class 3 severe obesity with serious comorbidity and body mass index (BMI) of 40.0 to 44.9 in adult,  unspecified obesity type ([place])  PLAN:  Hypertension We discussed sodium restriction, working on healthy weight loss, and a regular exercise program as the means to achieve improved blood pressure control. Heather [name] agreed with this plan and agreed to follow up as directed. We will continue to monitor her blood pressure as well as her progress with the above lifestyle modifications. Heather [name] was encouraged to stop her Losartan as advised, increase H20 intake, and will watch for signs of hypotension as she continues her lifestyle modifications. Heather [name] agrees to follow up with our clinic in 2 to 3 weeks and we will recheck blood pressure at that time.  Diabetes II Heather [name] has been given extensive diabetes education by myself today including ideal fasting and post-prandial blood glucose readings, individual ideal Hgb A1c goals and hypoglycemia prevention. We discussed the importance of good blood sugar control to decrease the likelihood of diabetic complications such as nephropathy, neuropathy, limb loss, blindness, coronary artery disease, and death. We discussed the importance of intensive lifestyle modification including diet, exercise and weight loss as the first line treatment for diabetes. Heather [name] agrees to continue her diabetes medications as prescribed and continue diet and exercise. We will recheck labs in 3 month and Heather [name] agrees to follow up with our clinic in 2 to 3 weeks.  Obesity Heather [name] is currently in the action stage of change. As such, her goal is to continue with weight loss efforts She has agreed to follow the Category 2 plan with breakfast options Heather [name] has been instructed to work up to a goal of 150 minutes of combined cardio and strengthening exercise per week for weight loss and overall health benefits. We discussed the following Behavioral Modification Strategies today: increasing lean protein intake,  increasing vegetables, increase H20 intake, and no skipping meals   Heather [name] has agreed to  follow up with our clinic in 2 to 3 weeks. She was informed of the importance of frequent follow up visits to maximize her success with intensive lifestyle modifications for her multiple health conditions.   OBESITY BEHAVIORAL INTERVENTION VISIT  Today's visit was # 6 out of 22.  Starting weight: 268 lbs Starting date: 12/23/16 Today's weight : 235 lbs  Today's date: 03/23/2017 Total lbs lost to date: 59 (Patients must lose 7 lbs in the first 6 months to continue with counseling)   ASK: We discussed the diagnosis of obesity with Heather [name] today and Heather [name] agreed to give [name] permission to discuss obesity behavioral modification therapy today.  ASSESS: Heather [name] has the diagnosis of obesity and her BMI today is 40.32 Heather [name] is in the action stage of change   ADVISE: Heather [name] was educated on the multiple health risks of obesity as well as the benefit of weight loss to improve her health. She was advised of the need for long term treatment and the importance of lifestyle modifications.  AGREE: Multiple dietary modification options and treatment options were discussed  and  Heather [name] agreed to the above obesity treatment plan.  I, [name], am acting as transcriptionist for [name], MD  I have reviewed the above documentation for accuracy and completeness, and I agree with the above. -[name], MD

## 2017-03-23 NOTE — Therapy (Signed)
[place], [place], [address] Phone: [phone]   Fax:  [phone]  Physical Therapy Treatment  Patient Details  Name: Heather [name] MRN: [id number] Date of Birth: March 26, 1945 Referring Provider: Hulan Saas DO   Encounter Date: 03/23/2017  PT End of Session - 03/23/17 1428    Visit Number  2    Number of Visits  12    Date for PT Re-Evaluation  04/23/17    Authorization Type  MCR    PT Start Time  0230 late    PT Stop Time  0300    PT Time Calculation (min)  30 min    Activity Tolerance  Patient tolerated treatment well;No increased pain    Behavior During Therapy  WFL for tasks assessed/performed       Past Medical History:  Diagnosis Date  . Abnormal Pap smear   . ALLERGIC RHINITIS 10/22/2006  . ANEMIA 12/18/2008  . Arthritis   . ASYMPTOMATIC POSTMENOPAUSAL STATUS 11/22/2007  . Back pain   . Complication of anesthesia   . Degenerative arthritis   . DIABETES MELLITUS, TYPE II 01/13/2007  . Dyslipidemia   . Fibroid   . GERD 07/20/2007  . GOITER, MULTINODULAR 07/20/2007  . Headache(784.0) 07/20/2007  . HEARING LOSS 11/22/2007  . Hiatal hernia   . Hx of colposcopy with cervical biopsy   . HYPERCHOLESTEROLEMIA 01/13/2007  . Hyperglycemia   . HYPERTENSION 10/22/2006  . NASH (nonalcoholic steatohepatitis)   . Nocturnal hypoxemia 02/17/2013  . Obesity   . Obstructive sleep apnea   . OSTEOARTHRITIS 10/22/2006  . Other chronic nonalcoholic liver disease 05/21/2246  . Sleep apnea     Past Surgical History:  Procedure Laterality Date  . BREAST SURGERY     Breast reduction  . CATARACT EXTRACTION, BILATERAL    . DEXA  08/2005  . DILATION AND CURETTAGE OF UTERUS    . ELECTROCARDIOGRAM  10/15/2006  . ESOPHAGOGASTRODUODENOSCOPY  12/08/2005  . Stress Cardiolite  10/21/2005  . sweat gland removal    . WISDOM TOOTH EXTRACTION      There were no vitals filed for this visit.  Subjective  Assessment - 03/23/17 1429    Subjective  No changes from eval.     Currently in Pain?  Yes    Pain Score  3     Pain Location  Knee    Pain Orientation  Right;Left;Anterior                      [facility] Adult PT Treatment/Exercise - 03/23/17 0001      Exercises   Exercises  Knee/Hip      Knee/Hip Exercises: Standing   Heel Raises  Both;10 reps    Heel Raises Limitations  and toe raises x 10 RT/LT   with UE support.     Hip Abduction  Right;Left;10 reps    Abduction Limitations  toe touches, UE support      Knee/Hip Exercises: Supine   Heel Slides Limitations  hip clam into blue band x 12 with glut set     Bridges  10 reps    Bridges Limitations  then ball squeeze with glut set x 12 then     Straight Leg Raises  Right;Left;10 reps      Modalities   Modalities  Iontophoresis      Iontophoresis   Type of Iontophoresis  Dexamethasone    Location  RT/LT knee anterior    Dose  1cc    Time  4-6 hours               PT Short Term Goals - 03/16/17 1318      PT SHORT TERM GOAL #1   Title  independent with initial HEP    Time  3    Period  Weeks    Status  New      PT SHORT TERM GOAL #2   Title  Report pain decrease at rest from 6/10 to 3/10    Time  3    Period  Weeks    Status  New        PT Long Term Goals - 03/16/17 1318      PT LONG TERM GOAL #1   Title  "Pt will be independent with advanced HEP    Time  6    Period  Weeks    Status  New      PT LONG TERM GOAL #2   Title  "FOTO will improve from 66% limitation  to  54%  limited or less indicating improved functional mobility    Time  6    Period  Weeks    Status  New      PT LONG TERM GOAL #3   Title  BERG score witll improve to 50/56 to decr risk of fall    Period  Weeks    Status  New      PT LONG TERM GOAL #4   Title  Pt will increase R hip and knee AROM in order to don socks with pain level 2/10 or less    Time  6    Period  Weeks    Status  New      PT LONG TERM GOAL #5    Title  Pt will demonstrate sit to stand form chair without using compensatory motions(blocking knee, momentum) x 5    Time  6    Period  Weeks    Status  New            Plan - 03/23/17 1432    Clinical Impression Statement  Slow with all exercise but done without incr pain. Trial of ionto patch and cautioned to remove if irritating skin.     PT Treatment/Interventions  Cryotherapy;Moist Heat;Iontophoresis 40m/ml Dexamethasone;Ultrasound;Therapeutic exercise;Manual techniques;Patient/family education;Functional mobility training    PT Next Visit Plan  HEP for strength in hips and quads. , balance exercises. heat as needed ionto if no issues    PT Home Exercise Plan  SLR ,  bent knee raise, bridge, stand heel toe raise.     Consulted and Agree with Plan of Care  Patient       Patient will benefit from skilled therapeutic intervention in order to improve the following deficits and impairments:  Pain, Obesity, Difficulty walking, Increased edema, Decreased balance, Decreased activity tolerance, Decreased range of motion, Decreased strength, Decreased endurance, Postural dysfunction  Visit Diagnosis: Chronic knee pain, unspecified laterality  Muscle weakness (generalized)  Stiffness of right knee, not elsewhere classified  Stiffness of left knee, not elsewhere classified  Unsteadiness on feet  Difficulty in walking, not elsewhere classified     Problem List Patient Active Problem List   Diagnosis Date Noted  . Type 2 diabetes mellitus without complication, without long-term current use of insulin ([place] 02/15/2017  . Bilateral leg cramps 01/05/2017  . High serum vitamin B12 01/05/2017  . Class 3 severe obesity with serious comorbidity and body mass  index (BMI) of 40.0 to 44.9 in adult [facility]) 01/05/2017  . Peripheral neuropathy 05/27/2016  . Screen for colon cancer 04/02/2016  . Elevated sed rate 03/05/2016  . RLS (restless legs syndrome) 02/12/2016  . Muscle spasm of  both lower legs 02/12/2016  . Edema of both ankles 02/12/2016  . Iron deficiency anemia 02/12/2016  . Hypokalemia 02/12/2016  . Pernicious anemia 02/12/2016  . Degenerative disc disease, lumbar 04/09/2015  . Degenerative arthritis of knee, bilateral 03/13/2015  . Chronic low back pain 03/13/2015  . Diabetes ([place]) 02/15/2015  . Loss of weight 08/01/2014  . Coronary atherosclerosis of native coronary artery 10/31/2013  . Incidental lung nodule, > 674m and < 856m Left lower lobe 74m58m June 2014, April 2015 unchanged 06/25/2013  . OSA on CPAP 06/21/2013  . 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 Myalgia and myositis 05/02/2013  . Muscle ache 04/03/2013  . Nocturnal hypoxemia 02/17/2013  . Morbid obesity ([place]/25/2014  . Hiatal hernia seen on CT 08/24/2012  .  LLL nodule 6mm4mJune 2014, described in 2003 07/13/2012  . Asthma, chronic 06/29/2012  . Arthralgia 06/02/2012  . Unspecified asthma(493.90) 06/02/2012  . Hypersomnia with sleep apnea, unspecified 03/11/2012  . Other alteration of consciousness 03/11/2012  . Migraine without aura 03/11/2012  . Encounter for long-term (current) use of other medications 12/28/2011  . Dyspnea 12/28/2011  . Screening examination for infectious disease 12/28/2011  . Hx of fibroids 08/03/2011  . Knee pain, left 12/25/2010  . NECK PAIN 12/03/2009  . EDEMA 12/18/2008  . TINNITUS 11/22/2007  . HEARING LOSS 11/22/2007  . KNEE PAIN, RIGHT 11/22/2007  . ASYMPTOMATIC POSTMENOPAUSAL STATUS 11/22/2007  . GOITER, MULTINODULAR 07/20/2007  . GERD 07/20/2007  . NASH (nonalcoholic steatohepatitis) 07/20/2007  . Headache 07/20/2007  . HYPERCHOLESTEROLEMIA 01/13/2007  . ANXIETY 10/22/2006  . Essential hypertension 10/22/2006  . ALLERGIC RHINITIS 10/22/2006  . Osteoarthritis 10/22/2006    ChasDarrel Hoover 03/23/2017, 3:03 PM  [facility] [place], [id number]: [phone]:  [phone]: GretKYLEENA SCHEIRER: [phone] of Birth: 09/2304-05-1945

## 2017-03-24 ENCOUNTER — Ambulatory Visit: Payer: Medicare Other | Admitting: Physical Therapy

## 2017-03-24 ENCOUNTER — Encounter: Payer: Self-pay | Admitting: Physical Therapy

## 2017-03-24 DIAGNOSIS — M25661 Stiffness of right knee, not elsewhere classified: Secondary | ICD-10-CM | POA: Diagnosis not present

## 2017-03-24 DIAGNOSIS — M25662 Stiffness of left knee, not elsewhere classified: Secondary | ICD-10-CM | POA: Diagnosis not present

## 2017-03-24 DIAGNOSIS — R2681 Unsteadiness on feet: Secondary | ICD-10-CM

## 2017-03-24 DIAGNOSIS — M25569 Pain in unspecified knee: Secondary | ICD-10-CM | POA: Diagnosis not present

## 2017-03-24 DIAGNOSIS — M6281 Muscle weakness (generalized): Secondary | ICD-10-CM

## 2017-03-24 DIAGNOSIS — G8929 Other chronic pain: Secondary | ICD-10-CM

## 2017-03-24 DIAGNOSIS — R262 Difficulty in walking, not elsewhere classified: Secondary | ICD-10-CM

## 2017-03-24 NOTE — Therapy (Signed)
[place], [place], [address] Phone: [phone]   Fax:  [phone]  Physical Therapy Treatment  Patient Details  Name: Heather [name] MRN: [id number] Date of Birth: [birth year] Referring Provider: Hulan Saas DO   Encounter Date: 03/24/2017  PT End of Session - 03/24/17 1304    Visit Number  3    Number of Visits  12    Date for PT Re-Evaluation  04/23/17    Authorization Type  MCR    PT Start Time  0100    PT Stop Time  0138    PT Time Calculation (min)  38 min       Past Medical History:  Diagnosis Date  . Abnormal Pap smear   . ALLERGIC RHINITIS 10/22/2006  . ANEMIA 12/18/2008  . Arthritis   . ASYMPTOMATIC POSTMENOPAUSAL STATUS 11/22/2007  . Back pain   . Complication of anesthesia   . Degenerative arthritis   . DIABETES MELLITUS, TYPE II 01/13/2007  . Dyslipidemia   . Fibroid   . GERD 07/20/2007  . GOITER, MULTINODULAR 07/20/2007  . Headache(784.0) 07/20/2007  . HEARING LOSS 11/22/2007  . Hiatal hernia   . Hx of colposcopy with cervical biopsy   . HYPERCHOLESTEROLEMIA 01/13/2007  . Hyperglycemia   . HYPERTENSION 10/22/2006  . NASH (nonalcoholic steatohepatitis)   . Nocturnal hypoxemia 02/17/2013  . Obesity   . Obstructive sleep apnea   . OSTEOARTHRITIS 10/22/2006  . Other chronic nonalcoholic liver disease 7/59/1638  . Sleep apnea     Past Surgical History:  Procedure Laterality Date  . BREAST SURGERY     Breast reduction  . CATARACT EXTRACTION, BILATERAL    . DEXA  08/2005  . DILATION AND CURETTAGE OF UTERUS    . ELECTROCARDIOGRAM  10/15/2006  . ESOPHAGOGASTRODUODENOSCOPY  12/08/2005  . Stress Cardiolite  10/21/2005  . sweat gland removal    . WISDOM TOOTH EXTRACTION      There were no vitals filed for this visit.  Subjective Assessment - 03/24/17 1304    Subjective  Hard to get out of the chair     Currently in Pain?  Yes    Pain Score  3     Pain Location  Knee     Pain Orientation  Right;Left    Pain Descriptors / Indicators  Aching;Tightness crunching     Pain Type  Chronic pain    Aggravating Factors   movement     Pain Relieving Factors  rest, water exercises, heat, cream                       [facility] Adult PT Treatment/Exercise - 03/24/17 0001      Knee/Hip Exercises: Aerobic   Nustep  L4 LE only x 5 minutes       Knee/Hip Exercises: Standing   Heel Raises  20 reps    Heel Raises Limitations  toe raises x 20     Knee Flexion  10 reps    Hip Abduction  Right;Left;10 reps;2 sets    Abduction Limitations  toe touches, UE support      Knee/Hip Exercises: Seated   Long Arc Quad  2 sets;10 reps      Knee/Hip Exercises: Supine   Heel Slides Limitations  hip clam into blue band x 15 with glut set     Bridges  10 reps    Bridges Limitations  then ball squeeze with glut set x 12  then     Straight Leg Raises  Right;Left;10 reps      Iontophoresis   Type of Iontophoresis  Dexamethasone    Location  RT/LT knee anterior/medial     Dose  1cc    Time  4-6 hours               PT Short Term Goals - 03/16/17 1318      PT SHORT TERM GOAL #1   Title  independent with initial HEP    Time  3    Period  Weeks    Status  New      PT SHORT TERM GOAL #2   Title  Report pain decrease at rest from 6/10 to 3/10    Time  3    Period  Weeks    Status  New        PT Long Term Goals - 03/16/17 1318      PT LONG TERM GOAL #1   Title  "Pt will be independent with advanced HEP    Time  6    Period  Weeks    Status  New      PT LONG TERM GOAL #2   Title  "FOTO will improve from 66% limitation  to  54%  limited or less indicating improved functional mobility    Time  6    Period  Weeks    Status  New      PT LONG TERM GOAL #3   Title  BERG score witll improve to 50/56 to decr risk of fall    Period  Weeks    Status  New      PT LONG TERM GOAL #4   Title  Pt will increase R hip and knee AROM in order to don socks with  pain level 2/10 or less    Time  6    Period  Weeks    Status  New      PT LONG TERM GOAL #5   Title  Pt will demonstrate sit to stand form chair without using compensatory motions(blocking knee, momentum) x 5    Time  6    Period  Weeks    Status  New            Plan - 03/24/17 1307    Clinical Impression Statement  She reports no pain after ionto patch and felt pretty good this morning. She is unsure if this was directly related to the ionto patch. We reviewed her HEP. She as not had a chance to try them at home. Began LAQ which she reports she could feel muscle burning. Repeated Ionto. No pain upon standing after treatment.     PT Next Visit Plan  HEP for strength in hips and quads. , balance exercises. heat as needed ionto if no issues    PT Home Exercise Plan  SLR ,  bent knee raise, bridge, stand heel toe raise.     Consulted and Agree with Plan of Care  Patient       Patient will benefit from skilled therapeutic intervention in order to improve the following deficits and impairments:  Pain, Obesity, Difficulty walking, Increased edema, Decreased balance, Decreased activity tolerance, Decreased range of motion, Decreased strength, Decreased endurance, Postural dysfunction  Visit Diagnosis: Chronic knee pain, unspecified laterality  Muscle weakness (generalized)  Stiffness of right knee, not elsewhere classified  Stiffness of left knee, not elsewhere classified  Unsteadiness on feet  Difficulty  in walking, not elsewhere classified     Problem List Patient Active Problem List   Diagnosis Date Noted  . Type 2 diabetes mellitus without complication, without long-term current use of insulin ([place]) 02/15/2017  . Bilateral leg cramps 01/05/2017  . High serum vitamin B12 01/05/2017  . Class 3 severe obesity with serious comorbidity and body mass index (BMI) of 40.0 to 44.9 in adult ([place]) 01/05/2017  . Peripheral neuropathy 05/27/2016  . Screen for colon cancer  04/02/2016  . Elevated sed rate 03/05/2016  . RLS (restless legs syndrome) 02/12/2016  . Muscle spasm of both lower legs 02/12/2016  . Edema of both ankles 02/12/2016  . Iron deficiency anemia 02/12/2016  . Hypokalemia 02/12/2016  . Pernicious anemia 02/12/2016  . Degenerative disc disease, lumbar 04/09/2015  . Degenerative arthritis of knee, bilateral 03/13/2015  . Chronic low back pain 03/13/2015  . Diabetes ([place]) 02/15/2015  . Loss of weight 08/01/2014  . Coronary atherosclerosis of native coronary artery 10/31/2013  . Incidental lung nodule, > 52m and < 82m Left lower lobe 57m70m June 2014, April 2015 unchanged 06/25/2013  . OSA on CPAP 06/21/2013  . 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 Myalgia and myositis 05/02/2013  . Muscle ache 04/03/2013  . Nocturnal hypoxemia 02/17/2013  . Morbid obesity ([facility]1/25/2014  . Hiatal hernia seen on CT 08/24/2012  .  LLL nodule 6mm39mJune 2014, described in 2003 07/13/2012  . Asthma, chronic 06/29/2012  . Arthralgia 06/02/2012  . Unspecified asthma(493.90) 06/02/2012  . Hypersomnia with sleep apnea, unspecified 03/11/2012  . Other alteration of consciousness 03/11/2012  . Migraine without aura 03/11/2012  . Encounter for long-term (current) use of other medications 12/28/2011  . Dyspnea 12/28/2011  . Screening examination for infectious disease 12/28/2011  . Hx of fibroids 08/03/2011  . Knee pain, left 12/25/2010  . NECK PAIN 12/03/2009  . EDEMA 12/18/2008  . TINNITUS 11/22/2007  . HEARING LOSS 11/22/2007  . KNEE PAIN, RIGHT 11/22/2007  . ASYMPTOMATIC POSTMENOPAUSAL STATUS 11/22/2007  . GOITER, MULTINODULAR 07/20/2007  . GERD 07/20/2007  . NASH (nonalcoholic steatohepatitis) 07/20/2007  . Headache 07/20/2007  . HYPERCHOLESTEROLEMIA 01/13/2007  . ANXIETY 10/22/2006  . Essential hypertension 10/22/2006  . ALLERGIC RHINITIS 10/22/2006  . Osteoarthritis 10/22/2006    DonoDorene ArA 03/24/2017, 1:45 PM  [facility] [place], [place]ne: [phone]:  [phone]: GretBURDETTE GERGELY: [phone] of Birth: 10/2307/28/1947

## 2017-03-30 ENCOUNTER — Ambulatory Visit: Payer: Medicare Other | Admitting: Physical Therapy

## 2017-03-30 ENCOUNTER — Encounter: Payer: Self-pay | Admitting: Physical Therapy

## 2017-03-30 DIAGNOSIS — M25662 Stiffness of left knee, not elsewhere classified: Secondary | ICD-10-CM

## 2017-03-30 DIAGNOSIS — R2681 Unsteadiness on feet: Secondary | ICD-10-CM | POA: Diagnosis not present

## 2017-03-30 DIAGNOSIS — M25661 Stiffness of right knee, not elsewhere classified: Secondary | ICD-10-CM

## 2017-03-30 DIAGNOSIS — M6281 Muscle weakness (generalized): Secondary | ICD-10-CM

## 2017-03-30 DIAGNOSIS — M25569 Pain in unspecified knee: Principal | ICD-10-CM

## 2017-03-30 DIAGNOSIS — G8929 Other chronic pain: Secondary | ICD-10-CM | POA: Diagnosis not present

## 2017-03-30 DIAGNOSIS — R262 Difficulty in walking, not elsewhere classified: Secondary | ICD-10-CM

## 2017-03-30 NOTE — Patient Instructions (Addendum)
Tandem Stance   STAND AT COUNTER FOR SUPPORT  Right foot in front of left, heel touching toe both feet "straight ahead". Stand on Foot Triangle of Support with both feet. Balance in this position up to 30 seconds.  Do with left foot in front of right. Repeat 2 times per day  Single Leg - Eyes Open    Holding support, lift right leg while maintaining balance over other leg. Progress to removing hands from support surface for longer periods of time. Hold up to 30 seconds  Repeat 2 times per day.   SIT TO STAND: No Device   HAVE WALKER IN FRONT OF YOU OR PERSON STAND BESIDE YOU Sit with feet shoulder-width apart, on floor. Lean chest forward, raise hips up from surface. Straighten hips and knees. Weight bear equally on left and right sides. _5__ reps per set, _2__ sets per day, 7___ days per week     ABDUCTION: Standing (Active)   Stand, feet flat. Lift right leg out to side. Use _0__ lbs. Repeat with opposite leg.  Complete __10-20_ repetitions. Perform __2_ sessions per day.

## 2017-03-30 NOTE — Therapy (Signed)
[place], [place], [address] Phone: [phone]   Fax:  [phone]  Physical Therapy Treatment  Patient Details  Name: Heather [name] MRN: [id number] Date of Birth: Dec 02, 1945 Referring Provider: Hulan Saas DO   Encounter Date: 03/30/2017  PT End of Session - 03/30/17 1436    Visit Number  4    Number of Visits  12    Date for PT Re-Evaluation  04/23/17    Authorization Type  MCR    PT Start Time  0230 15 minutes late     PT Stop Time  0315    PT Time Calculation (min)  45 min       Past Medical History:  Diagnosis Date  . Abnormal Pap smear   . ALLERGIC RHINITIS 10/22/2006  . ANEMIA 12/18/2008  . Arthritis   . ASYMPTOMATIC POSTMENOPAUSAL STATUS 11/22/2007  . Back pain   . Complication of anesthesia   . Degenerative arthritis   . DIABETES MELLITUS, TYPE II 01/13/2007  . Dyslipidemia   . Fibroid   . GERD 07/20/2007  . GOITER, MULTINODULAR 07/20/2007  . Headache(784.0) 07/20/2007  . HEARING LOSS 11/22/2007  . Hiatal hernia   . Hx of colposcopy with cervical biopsy   . HYPERCHOLESTEROLEMIA 01/13/2007  . Hyperglycemia   . HYPERTENSION 10/22/2006  . NASH (nonalcoholic steatohepatitis)   . Nocturnal hypoxemia 02/17/2013  . Obesity   . Obstructive sleep apnea   . OSTEOARTHRITIS 10/22/2006  . Other chronic nonalcoholic liver disease 0/10/3816  . Sleep apnea     Past Surgical History:  Procedure Laterality Date  . BREAST SURGERY     Breast reduction  . CATARACT EXTRACTION, BILATERAL    . DEXA  08/2005  . DILATION AND CURETTAGE OF UTERUS    . ELECTROCARDIOGRAM  10/15/2006  . ESOPHAGOGASTRODUODENOSCOPY  12/08/2005  . Stress Cardiolite  10/21/2005  . sweat gland removal    . WISDOM TOOTH EXTRACTION      There were no vitals filed for this visit.  Subjective Assessment - 03/30/17 1435    Currently in Pain?  Yes    Pain Score  2     Pain Location  Knee    Pain Orientation  Right;Left     Pain Descriptors / Indicators  Aching                      [facility] Adult PT Treatment/Exercise - 03/30/17 0001      Knee/Hip Exercises: Aerobic   Nustep  L4 LE only x 5 minutes       Knee/Hip Exercises: Standing   Heel Raises  20 reps    Heel Raises Limitations  toe raises x 20     SLS  5 sec best     Other Standing Knee Exercises  tandem 30 sec , 5 sec best       Knee/Hip Exercises: Seated   Long Arc Quad  2 sets;10 reps    Sit to [employer]  2 sets;5 reps seat elevated foam pad +pillow             PT Education - 03/30/17 1500    Education provided  Yes    Education Details  HEP    Person(s) Educated  Patient    Methods  Explanation;Handout    Comprehension  Verbalized understanding       PT Short Term Goals - 03/16/17 1318      PT SHORT TERM GOAL #1  Title  independent with initial HEP    Time  3    Period  Weeks    Status  New      PT SHORT TERM GOAL #2   Title  Report pain decrease at rest from 6/10 to 3/10    Time  3    Period  Weeks    Status  New        PT Long Term Goals - 03/16/17 1318      PT LONG TERM GOAL #1   Title  "Pt will be independent with advanced HEP    Time  6    Period  Weeks    Status  New      PT LONG TERM GOAL #2   Title  "FOTO will improve from 66% limitation  to  54%  limited or less indicating improved functional mobility    Time  6    Period  Weeks    Status  New      PT LONG TERM GOAL #3   Title  BERG score witll improve to 50/56 to decr risk of fall    Period  Weeks    Status  New      PT LONG TERM GOAL #4   Title  Pt will increase R hip and knee AROM in order to don socks with pain level 2/10 or less    Time  6    Period  Weeks    Status  New      PT LONG TERM GOAL #5   Title  Pt will demonstrate sit to stand form chair without using compensatory motions(blocking knee, momentum) x 5    Time  6    Period  Weeks    Status  New            Plan - 03/30/17 1436    Clinical Impression  Statement  Pt arrives late today. Pt reports increased achiness in knees due to cold weather/rain. Began balance exercises and updated HEP. Needs elevated seat to rise without UE support. HMP today to bilateral knees. Trial without ionto patches today.     PT Next Visit Plan  HEP for strength in hips and quads. , balance exercises. heat as needed ionto if no issues; review new HEP    PT Home Exercise Plan  SLR ,  bent knee raise, bridge, stand heel toe raise. sit-stand (elevated seat), standing hip abduction, SLS and tandem trials at counter     Consulted and Agree with Plan of Care  Patient       Patient will benefit from skilled therapeutic intervention in order to improve the following deficits and impairments:  Pain, Obesity, Difficulty walking, Increased edema, Decreased balance, Decreased activity tolerance, Decreased range of motion, Decreased strength, Decreased endurance, Postural dysfunction  Visit Diagnosis: Chronic knee pain, unspecified laterality  Muscle weakness (generalized)  Stiffness of right knee, not elsewhere classified  Stiffness of left knee, not elsewhere classified  Unsteadiness on feet  Difficulty in walking, not elsewhere classified     Problem List Patient Active Problem List   Diagnosis Date Noted  . Type 2 diabetes mellitus without complication, without long-term current use of insulin ([place]) 02/15/2017  . Bilateral leg cramps 01/05/2017  . High serum vitamin B12 01/05/2017  . Class 3 severe obesity with serious comorbidity and body mass index (BMI) of 40.0 to 44.9 in adult ([place]) 01/05/2017  . Peripheral neuropathy 05/27/2016  . Screen for colon cancer 04/02/2016  .  Elevated sed rate 03/05/2016  . RLS (restless legs syndrome) 02/12/2016  . Muscle spasm of both lower legs 02/12/2016  . Edema of both ankles 02/12/2016  . Iron deficiency anemia 02/12/2016  . Hypokalemia 02/12/2016  . Pernicious anemia 02/12/2016  . Degenerative disc disease, lumbar  04/09/2015  . Degenerative arthritis of knee, bilateral 03/13/2015  . Chronic low back pain 03/13/2015  . Diabetes ([place]) 02/15/2015  . Loss of weight 08/01/2014  . Coronary atherosclerosis of native coronary artery 10/31/2013  . Incidental lung nodule, > 30m and < 843m Left lower lobe 38m21m June 2014, April 2015 unchanged 06/25/2013  . OSA on CPAP 06/21/2013  . 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 Myalgia and myositis 05/02/2013  . Muscle ache 04/03/2013  . Nocturnal hypoxemia 02/17/2013  . Morbid obesity ([facility]CC[facility]1/25/2014  . Hiatal hernia seen on CT 08/24/2012  .  LLL nodule 6mm538mJune 2014, described in 2003 07/13/2012  . Asthma, chronic 06/29/2012  . Arthralgia 06/02/2012  . Unspecified asthma(493.90) 06/02/2012  . Hypersomnia with sleep apnea, unspecified 03/11/2012  . Other alteration of consciousness 03/11/2012  . Migraine without aura 03/11/2012  . Encounter for long-term (current) use of other medications 12/28/2011  . Dyspnea 12/28/2011  . Screening examination for infectious disease 12/28/2011  . Hx of fibroids 08/03/2011  . Knee pain, left 12/25/2010  . NECK PAIN 12/03/2009  . EDEMA 12/18/2008  . TINNITUS 11/22/2007  . HEARING LOSS 11/22/2007  . KNEE PAIN, RIGHT 11/22/2007  . ASYMPTOMATIC POSTMENOPAUSAL STATUS 11/22/2007  . GOITER, MULTINODULAR 07/20/2007  . GERD 07/20/2007  . NASH (nonalcoholic steatohepatitis) 07/20/2007  . Headache 07/20/2007  . HYPERCHOLESTEROLEMIA 01/13/2007  . ANXIETY 10/22/2006  . Essential hypertension 10/22/2006  . ALLERGIC RHINITIS 10/22/2006  . Osteoarthritis 10/22/2006    DonoDorene ArA 03/30/2017, 3:14 PM  [facility], [id number]: [phone]:  [phone]: GretALIANNAH HOLSTROM: [id number] of Birth: [birth year]

## 2017-04-01 ENCOUNTER — Ambulatory Visit: Payer: Medicare Other

## 2017-04-01 DIAGNOSIS — M25569 Pain in unspecified knee: Secondary | ICD-10-CM | POA: Diagnosis not present

## 2017-04-01 DIAGNOSIS — M6281 Muscle weakness (generalized): Secondary | ICD-10-CM | POA: Diagnosis not present

## 2017-04-01 DIAGNOSIS — M25661 Stiffness of right knee, not elsewhere classified: Secondary | ICD-10-CM

## 2017-04-01 DIAGNOSIS — M25662 Stiffness of left knee, not elsewhere classified: Secondary | ICD-10-CM

## 2017-04-01 DIAGNOSIS — G8929 Other chronic pain: Secondary | ICD-10-CM | POA: Diagnosis not present

## 2017-04-01 DIAGNOSIS — R2681 Unsteadiness on feet: Secondary | ICD-10-CM

## 2017-04-01 NOTE — Therapy (Signed)
[place], [place], [address] Phone: [phone]   Fax:  [phone]  Physical Therapy Treatment  Patient Details  Name: Heather [name] MRN: [id number] Date of Birth: March 07, 1945 Referring Provider: Hulan Saas DO   Encounter Date: 04/01/2017  PT End of Session - 04/01/17 1455    Visit Number  5    Number of Visits  12    Date for PT Re-Evaluation  04/23/17    Authorization Type  MCR    PT Start Time  0254    PT Stop Time  0335    PT Time Calculation (min)  41 min    Activity Tolerance  Patient tolerated treatment well;No increased pain    Behavior During Therapy  WFL for tasks assessed/performed       Past Medical History:  Diagnosis Date  . Abnormal Pap smear   . ALLERGIC RHINITIS 10/22/2006  . ANEMIA 12/18/2008  . Arthritis   . ASYMPTOMATIC POSTMENOPAUSAL STATUS 11/22/2007  . Back pain   . Complication of anesthesia   . Degenerative arthritis   . DIABETES MELLITUS, TYPE II 01/13/2007  . Dyslipidemia   . Fibroid   . GERD 07/20/2007  . GOITER, MULTINODULAR 07/20/2007  . Headache(784.0) 07/20/2007  . HEARING LOSS 11/22/2007  . Hiatal hernia   . Hx of colposcopy with cervical biopsy   . HYPERCHOLESTEROLEMIA 01/13/2007  . Hyperglycemia   . HYPERTENSION 10/22/2006  . NASH (nonalcoholic steatohepatitis)   . Nocturnal hypoxemia 02/17/2013  . Obesity   . Obstructive sleep apnea   . OSTEOARTHRITIS 10/22/2006  . Other chronic nonalcoholic liver disease 1/60/7371  . Sleep apnea     Past Surgical History:  Procedure Laterality Date  . BREAST SURGERY     Breast reduction  . CATARACT EXTRACTION, BILATERAL    . DEXA  08/2005  . DILATION AND CURETTAGE OF UTERUS    . ELECTROCARDIOGRAM  10/15/2006  . ESOPHAGOGASTRODUODENOSCOPY  12/08/2005  . Stress Cardiolite  10/21/2005  . sweat gland removal    . WISDOM TOOTH EXTRACTION      There were no vitals filed for this visit.  Subjective Assessment -  04/01/17 1456    Subjective  No pain at the moment.     Currently in Pain?  No/denies                      [facility] Adult PT Treatment/Exercise - 04/01/17 0001      Knee/Hip Exercises: Aerobic   Nustep  L4 LE only x 6 minutes       Knee/Hip Exercises: Standing   Lateral Step Up  Right;Left;10 reps;Step Height: 6"    Other Standing Knee Exercises  side stepping 10 feet x 5 RT and LT.     Other Standing Knee Exercises  close to tandem s able for 30 sec ,  with shoulder rotation RT/LT        Knee/Hip Exercises: Seated   Long Arc Quad  Right;Left;20 reps;Weights;Limitations    Long Arc Quad Weight  4 lbs.      Knee/Hip Exercises: Supine   Bridges  15 reps;10 reps    Bridges with [employer]  -- 12 reps    Bridges with [employer]  Limitations green band,  12 reps    Straight Leg Raises  Right;Left;20 reps               PT Short Term Goals - 04/01/17 1534  PT SHORT TERM GOAL #1   Title  independent with initial HEP    Status  Achieved      PT SHORT TERM GOAL #2   Title  Report pain decrease at rest from 6/10 to 3/10    Baseline  0/10 pain today    Status  Achieved      PT SHORT TERM GOAL #3   Title  Demonstrate and verbalize understanding of condition management including RICE, positioning, use of AD, HEP    Status  Achieved      PT SHORT TERM GOAL #4   Title  "Demonstrate understanding of proper sitting posture and be more conscious of position and posture throughout the day.     Status  On-going        PT Long Term Goals - 03/16/17 1318      PT LONG TERM GOAL #1   Title  "Pt will be independent with advanced HEP    Time  6    Period  Weeks    Status  New      PT LONG TERM GOAL #2   Title  "FOTO will improve from 66% limitation  to  54%  limited or less indicating improved functional mobility    Time  6    Period  Weeks    Status  New      PT LONG TERM GOAL #3   Title  BERG score witll improve to 50/56 to decr risk of fall    Period   Weeks    Status  New      PT LONG TERM GOAL #4   Title  Pt will increase R hip and knee AROM in order to don socks with pain level 2/10 or less    Time  6    Period  Weeks    Status  New      PT LONG TERM GOAL #5   Title  Pt will demonstrate sit to stand form chair without using compensatory motions(blocking knee, momentum) x 5    Time  6    Period  Weeks    Status  New            Plan - 04/01/17 1456    Clinical Impression Statement  No pain . continues with functional weakness with getting out of chair.  she did well with step ups but braced with bar.  declined modalities as was going to choir practice after.    PT Treatment/Interventions  Cryotherapy;Moist Heat;Iontophoresis 22m/ml Dexamethasone;Ultrasound;Therapeutic exercise;Manual techniques;Patient/family education;Functional mobility training    PT Next Visit Plan  HEP for strength in hips and quads. , balance exercises. heat as needed ionto if no issues; review new HEP    PT Home Exercise Plan  SLR ,  bent knee raise, bridge, stand heel toe raise. sit-stand (elevated seat), standing hip abduction, SLS and tandem trials at counter     Consulted and Agree with Plan of Care  Patient       Patient will benefit from skilled therapeutic intervention in order to improve the following deficits and impairments:  Pain, Obesity, Difficulty walking, Increased edema, Decreased balance, Decreased activity tolerance, Decreased range of motion, Decreased strength, Decreased endurance, Postural dysfunction  Visit Diagnosis: Chronic knee pain, unspecified laterality  Muscle weakness (generalized)  Stiffness of right knee, not elsewhere classified  Stiffness of left knee, not elsewhere classified  Unsteadiness on feet     Problem List Patient Active Problem List   Diagnosis Date  Noted  . Type 2 diabetes mellitus without complication, without long-term current use of insulin ([place]) 02/15/2017  . Bilateral leg cramps 01/05/2017   . High serum vitamin B12 01/05/2017  . Class 3 severe obesity with serious comorbidity and body mass index (BMI) of 40.0 to 44.9 in adult ([place]) 01/05/2017  . Peripheral neuropathy 05/27/2016  . Screen for colon cancer 04/02/2016  . Elevated sed rate 03/05/2016  . RLS (restless legs syndrome) 02/12/2016  . Muscle spasm of both lower legs 02/12/2016  . Edema of both ankles 02/12/2016  . Iron deficiency anemia 02/12/2016  . Hypokalemia 02/12/2016  . Pernicious anemia 02/12/2016  . Degenerative disc disease, lumbar 04/09/2015  . Degenerative arthritis of knee, bilateral 03/13/2015  . Chronic low back pain 03/13/2015  . Diabetes ([place]) 02/15/2015  . Loss of weight 08/01/2014  . Coronary atherosclerosis of native coronary artery 10/31/2013  . Incidental lung nodule, > 38m and < 868m Left lower lobe 80m22m June 2014, April 2015 unchanged 06/25/2013  . OSA on CPAP 06/21/2013  . 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 Myalgia and myositis 05/02/2013  . Muscle ache 04/03/2013  . Nocturnal hypoxemia 02/17/2013  . Morbid obesity (HCCNey1/25/2014  . Hiatal hernia seen on CT 08/24/2012  .  LLL nodule 6mm580mJune 2014, described in 2003 07/13/2012  . Asthma, chronic 06/29/2012  . Arthralgia 06/02/2012  . Unspecified asthma(493.90) 06/02/2012  . Hypersomnia with sleep apnea, unspecified 03/11/2012  . Other alteration of consciousness 03/11/2012  . Migraine without aura 03/11/2012  . Encounter for long-term (current) use of other medications 12/28/2011  . Dyspnea 12/28/2011  . Screening examination for infectious disease 12/28/2011  . Hx of fibroids 08/03/2011  . Knee pain, left 12/25/2010  . NECK PAIN 12/03/2009  . EDEMA 12/18/2008  . TINNITUS 11/22/2007  . HEARING LOSS 11/22/2007  . KNEE PAIN, RIGHT 11/22/2007  . ASYMPTOMATIC POSTMENOPAUSAL STATUS 11/22/2007  . GOITER, MULTINODULAR 07/20/2007  . GERD 07/20/2007  . NASH (nonalcoholic steatohepatitis) 07/20/2007  . Headache 07/20/2007  .  HYPERCHOLESTEROLEMIA 01/13/2007  . ANXIETY 10/22/2006  . Essential hypertension 10/22/2006  . ALLERGIC RHINITIS 10/22/2006  . Osteoarthritis 10/22/2006    ChasDarrel Hoover 04/01/2017, 3:36 PM  [facility]495 [address] Avenuee[place], [place]ne: [phone]:  [phone]: GretJAHNASIA TATUM: [id number] of Birth: [birth year]

## 2017-04-04 ENCOUNTER — Other Ambulatory Visit: Payer: Self-pay | Admitting: Endocrinology

## 2017-04-06 ENCOUNTER — Ambulatory Visit: Payer: Medicare Other | Admitting: Physical Therapy

## 2017-04-07 ENCOUNTER — Ambulatory Visit (INDEPENDENT_AMBULATORY_CARE_PROVIDER_SITE_OTHER): Payer: Medicare Other | Admitting: Family Medicine

## 2017-04-07 VITALS — BP 109/73 | HR 77 | Temp 97.9°F | Ht 64.0 in | Wt 230.0 lb

## 2017-04-07 DIAGNOSIS — K529 Noninfective gastroenteritis and colitis, unspecified: Secondary | ICD-10-CM | POA: Diagnosis not present

## 2017-04-07 DIAGNOSIS — Z6839 Body mass index (BMI) 39.0-39.9, adult: Secondary | ICD-10-CM | POA: Diagnosis not present

## 2017-04-07 NOTE — Progress Notes (Signed)
.   Office: [phone]  /  Fax: [phone]   HPI:   Chief Complaint: OBESITY Heather [name] is here to discuss her progress with her obesity treatment plan. She is on the Category 2 plan + breakfast options and is following her eating plan approximately 60 % of the time. She states she is doing physical therapy for 45 minutes 2 times per week. Heather [name] continues to do well with weight loss, but has had gastroenteritis the last 3 to 4 days and hasn't eaten much since then. She states hunger was controlled overall, but she has had increased temptations. Her weight is 230 lb (104.3 kg) today and has had a weight loss of 5 pounds over a period of 2 weeks since her last visit. She has lost 38 lbs since starting treatment with [name].  Gastroenteritis Heather [name] has GI upset with nausea, vomiting and diarrhea for 3 to 4 days. Her symptoms were severe for 2 days, but have been improving since. Heather [name] is able to drink water and was able to start eating again yesterday.  ALLERGIES: Allergies  Allergen Reactions   Aspirin    Coconut Oil    Latex    Lisinopril     REACTION: cough   Metoprolol Itching   Peanut-Containing Drug Products    Penicillins     REACTION: rash   Prednisone    Strawberry Extract     MEDICATIONS: Current Outpatient Medications on File Prior to Visit  Medication Sig Dispense Refill   acetaminophen (TYLENOL) 500 MG tablet Take 650 mg by mouth 3 (three) times daily as needed for pain.      ALLEGRA-D ALLERGY & CONGESTION 180-240 MG 24 hr tablet TAKE 1 TABLET BY MOUTH DAILY 30 tablet 0   Calcium Carbonate-Vitamin D (CALCIUM 600 + D PO) Take 2 tablets by mouth daily.       CALCIUM PO Take by mouth. Plus Vitamin D     clopidogrel (PLAVIX) 75 MG tablet Take 1 tablet (75 mg total) by mouth daily. 90 tablet 3   cyanocobalamin 1000 MCG tablet Take 1,000 mcg by mouth daily.     Ferrous Sulfate (IRON) 325 (65 FE) MG TABS Take 1 tablet by mouth daily.       furosemide (LASIX) 40  MG tablet TAKE 1 TABLET EVERY DAY 90 tablet 3   gabapentin (NEURONTIN) 100 MG capsule TAKE 2 CAPSULES(200 MG) BY MOUTH AT BEDTIME 180 capsule 1   glucosamine-chondroitin 500-400 MG tablet Take 1 tablet by mouth 2 (two) times daily.       metFORMIN (GLUCOPHAGE-XR) 500 MG 24 hr tablet TAKE 2 TABLETS TWICE DAILY 360 tablet 3   modafinil (PROVIGIL) 200 MG tablet Take 1 tablet (200 mg total) by mouth daily. 30 tablet 1   Multiple Vitamin (MULTIVITAMIN) tablet Take 1 tablet by mouth daily.       naproxen sodium (ANAPROX) 220 MG tablet Take 220 mg by mouth as needed.     Omega-3 Fatty Acids (FISH OIL) 1000 MG CAPS Take 1 capsule by mouth daily.     omeprazole (PRILOSEC) 20 MG capsule Take 20 mg by mouth daily as needed.      potassium chloride SA (K-DUR,KLOR-CON) 20 MEQ tablet Take 1 tablet (20 mEq total) 2 (two) times daily by mouth. 60 tablet 0   Probiotic Product (PROBIOTIC-10) CAPS Take 1 capsule by mouth daily.     pseudoephedrine-acetaminophen (TYLENOL SINUS) 30-500 MG TABS tablet Take 1 tablet by mouth as needed.     pyridOXINE (VITAMIN  B-6) 100 MG tablet Take 200 mg by mouth daily.     rosuvastatin (CRESTOR) 40 MG tablet TAKE 1 TABLET EVERY DAY 90 tablet 3   SF 5000 PLUS 1.1 % CREA dental cream   0   SUMAtriptan (IMITREX) 20 MG/ACT nasal spray One spray (20 mg) in the nostril at the onset of migraine.May repeat in 2 hours if needed. No more than 2 sprays in a 24 hour period 12 Inhaler 1   topiramate (TOPAMAX) 100 MG tablet TAKE 1 TABLET EVERY DAY 90 tablet 3   traMADol (ULTRAM) 50 MG tablet Take 1 tablet (50 mg total) by mouth every 12 (twelve) hours as needed. 60 tablet 0   Turmeric 500 MG TABS Take 2 tablets by mouth daily.     venlafaxine XR (EFFEXOR-XR) 37.5 MG 24 hr capsule TAKE 1 CAPSULE (37.5 MG TOTAL) BY MOUTH DAILY WITH BREAKFAST. 90 capsule 1   vitamin E 200 UNIT capsule Take 200 Units by mouth daily.     Current Facility-Administered Medications on File Prior to  Visit  Medication Dose Route Frequency Provider Last Rate Last Dose   0.9 %  sodium chloride infusion  500 mL Intravenous Continuous Nandigam, Venia Minks, MD        PAST MEDICAL HISTORY: Past Medical History:  Diagnosis Date   Abnormal Pap smear    ALLERGIC RHINITIS 10/22/2006   ANEMIA 12/18/2008   Arthritis    ASYMPTOMATIC POSTMENOPAUSAL STATUS 11/22/2007   Back pain    Complication of anesthesia    Degenerative arthritis    DIABETES MELLITUS, TYPE II 01/13/2007   Dyslipidemia    Fibroid    GERD 07/20/2007   GOITER, MULTINODULAR 07/20/2007   Headache(784.0) 07/20/2007   HEARING LOSS 11/22/2007   Hiatal hernia    Hx of colposcopy with cervical biopsy    HYPERCHOLESTEROLEMIA 01/13/2007   Hyperglycemia    HYPERTENSION 10/22/2006   NASH (nonalcoholic steatohepatitis)    Nocturnal hypoxemia 02/17/2013   Obesity    Obstructive sleep apnea    OSTEOARTHRITIS 10/22/2006   Other chronic nonalcoholic liver disease 6/55/3748   Sleep apnea     PAST SURGICAL HISTORY: Past Surgical History:  Procedure Laterality Date   BREAST SURGERY     Breast reduction   CATARACT EXTRACTION, BILATERAL     DEXA  08/2005   DILATION AND CURETTAGE OF UTERUS     ELECTROCARDIOGRAM  10/15/2006   ESOPHAGOGASTRODUODENOSCOPY  12/08/2005   Stress Cardiolite  10/21/2005   sweat gland removal     WISDOM TOOTH EXTRACTION      SOCIAL HISTORY: Social History   Tobacco Use   Smoking status: Former Smoker    Packs/day: 2.00    Years: 20.00    Pack years: 40.00    Types: Cigarettes    Last attempt to quit: 03/03/1979    Years since quitting: 38.1   Smokeless tobacco: Never Used  Substance Use Topics   Alcohol use: Yes    Comment: rare   Drug use: No    FAMILY HISTORY: Family History  Problem Relation Age of Onset   Asthma Mother    Depression Mother    Bipolar disorder Mother    Dementia Mother    Arthritis Mother    Breast cancer Mother        primary    Colon cancer Mother        mets from breast   Cancer Mother    Hypertension Father    Migraines Father  Cancer Maternal Aunt    Heart disease Maternal Grandmother    Cancer Maternal Grandfather     ROS: Review of Systems  Constitutional: Positive for weight loss.  Gastrointestinal: Positive for diarrhea, nausea and vomiting.    PHYSICAL EXAM: Blood pressure 109/73, pulse 77, temperature 97.9 F (36.6 C), temperature source Oral, height 5' 4"  (1.626 m), weight 230 lb (104.3 kg), SpO2 100 %. Body mass index is 39.48 kg/m. Physical Exam  Constitutional: She is oriented to person, place, and time. She appears well-developed and well-nourished.  Cardiovascular: Normal rate.  Pulmonary/Chest: Effort normal.  Musculoskeletal: Normal range of motion.  Neurological: She is oriented to person, place, and time.  Skin: Skin is warm and dry.  Psychiatric: She has a normal mood and affect. Her behavior is normal.  Vitals reviewed.   RECENT LABS AND TESTS: BMET    Component Value Date/Time   NA 142 12/23/2016 0956   K 3.6 12/23/2016 0956   CL 102 12/23/2016 0956   CO2 30 (H) 12/23/2016 0956   GLUCOSE 89 12/23/2016 0956   GLUCOSE 116 (H) 03/05/2016 0958   BUN 13 12/23/2016 0956   CREATININE 0.74 12/23/2016 0956   CREATININE 0.81 12/28/2011 0853   CALCIUM 9.3 12/23/2016 0956   GFRNONAA 82 12/23/2016 0956   GFRAA 95 12/23/2016 0956   Lab Results  Component Value Date   HGBA1C 5.9 03/15/2017   HGBA1C 6.3 (H) 12/23/2016   HGBA1C 6.3 09/08/2016   HGBA1C 6.4 03/05/2016   HGBA1C 5.9 08/14/2015   Lab Results  Component Value Date   INSULIN 20.9 12/23/2016   CBC    Component Value Date/Time   WBC 4.9 12/23/2016 0956   WBC 7.2 03/05/2016 0958   RBC 4.32 12/23/2016 0956   RBC 4.46 03/05/2016 0958   HGB 12.1 12/23/2016 0956   HCT 37.4 12/23/2016 0956   PLT 300.0 03/05/2016 0958   MCV 87 12/23/2016 0956   MCH 28.0 12/23/2016 0956   MCH 29.4 12/28/2011 0853    MCHC 32.4 12/23/2016 0956   MCHC 32.9 03/05/2016 0958   RDW 17.5 (H) 12/23/2016 0956   LYMPHSABS 1.2 12/23/2016 0956   MONOABS 0.4 03/05/2016 0958   EOSABS 0.1 12/23/2016 0956   BASOSABS 0.0 12/23/2016 0956   Iron/TIBC/Ferritin/ %Sat    Component Value Date/Time   IRON 38 12/23/2016 0956   TIBC 271 12/23/2016 0956   FERRITIN 40 12/23/2016 0956   IRONPCTSAT 14 (L) 12/23/2016 0956   Lipid Panel     Component Value Date/Time   CHOL 178 12/23/2016 0956   TRIG 80 12/23/2016 0956   HDL 81 12/23/2016 0956   CHOLHDL 3 03/05/2016 0958   VLDL 26.0 03/05/2016 0958   LDLCALC 81 12/23/2016 0956   Hepatic Function Panel     Component Value Date/Time   PROT 7.4 12/23/2016 0956   ALBUMIN 4.1 12/23/2016 0956   AST 17 12/23/2016 0956   ALT 13 12/23/2016 0956   ALKPHOS 71 12/23/2016 0956   BILITOT <0.2 12/23/2016 0956   BILIDIR 0.1 02/15/2015 1010   IBILI 0.2 12/28/2011 0853      Component Value Date/Time   TSH 1.170 12/23/2016 0956   TSH 1.76 03/05/2016 0958   TSH 1.86 02/15/2015 1010    ASSESSMENT AND PLAN: Gastroenteritis  Class 2 severe obesity with serious comorbidity and body mass index (BMI) of 39.0 to 39.9 in adult, unspecified obesity type (Heather [name])  PLAN:  Gastroenteritis Heather [name] is to slowly increase mild foods. She is to continue to  increase H2O intake and get back to her diet prescription in the next week or so. She is not to exercise outside of physical therapy until she is recovered. Heather [name] agrees to follow up with our clinic in 2 weeks.  We spent > than 50% of the 15 minute visit on the counseling as documented in the note.  Obesity Heather [name] is currently in the action stage of change. As such, her goal is to continue with weight loss efforts She has agreed to follow the Category 2 plan with breakfast options We discussed the following Behavioral Modification Strategies today: increase H2O intake, increasing lean protein intake, decreasing simple carbohydrates  and work  on meal planning and easy cooking plans  Heather [name] has agreed to follow up with our clinic in 2 weeks. She was informed of the importance of frequent follow up visits to maximize her success with intensive lifestyle modifications for her multiple health conditions.   OBESITY BEHAVIORAL INTERVENTION VISIT  Today's visit was # 7 out of 22.  Starting weight: 268 lbs Starting date: 12/23/16 Today's weight : 230 lbs Today's date: 04/07/2017 Total lbs lost to date: 61 (Patients must lose 7 lbs in the first 6 months to continue with counseling)   ASK: We discussed the diagnosis of obesity with Heather [name] today and Heather [name] agreed to give [name] permission to discuss obesity behavioral modification therapy today.  ASSESS: Heather [name] has the diagnosis of obesity and her BMI today is 39.46 Heather [name] is in the action stage of change   ADVISE: Heather [name] was educated on the multiple health risks of obesity as well as the benefit of weight loss to improve her health. She was advised of the need for long term treatment and the importance of lifestyle modifications.  AGREE: Multiple dietary modification options and treatment options were discussed and  Heather [name] agreed to the above obesity treatment plan.  I, Doreene Nest, am acting as transcriptionist for [name], MD  I have reviewed the above documentation for accuracy and completeness, and I agree with the above. -[name], MD

## 2017-04-08 ENCOUNTER — Ambulatory Visit: Payer: Medicare Other | Attending: Family Medicine

## 2017-04-08 DIAGNOSIS — M25569 Pain in unspecified knee: Secondary | ICD-10-CM | POA: Insufficient documentation

## 2017-04-08 DIAGNOSIS — M6281 Muscle weakness (generalized): Secondary | ICD-10-CM | POA: Insufficient documentation

## 2017-04-08 DIAGNOSIS — M25661 Stiffness of right knee, not elsewhere classified: Secondary | ICD-10-CM | POA: Insufficient documentation

## 2017-04-08 DIAGNOSIS — R262 Difficulty in walking, not elsewhere classified: Secondary | ICD-10-CM | POA: Diagnosis not present

## 2017-04-08 DIAGNOSIS — G8929 Other chronic pain: Secondary | ICD-10-CM | POA: Diagnosis not present

## 2017-04-08 DIAGNOSIS — R2681 Unsteadiness on feet: Secondary | ICD-10-CM | POA: Diagnosis not present

## 2017-04-08 DIAGNOSIS — M25662 Stiffness of left knee, not elsewhere classified: Secondary | ICD-10-CM | POA: Insufficient documentation

## 2017-04-08 NOTE — Therapy (Signed)
[place], [place], [address] Phone: [phone]   Fax:  [phone]  Physical Therapy Treatment  Patient Details  Name: Heather [name] MRN: [id number] Date of Birth: [birth year] Referring Provider: Hulan Saas DO   Encounter Date: 04/08/2017  PT End of Session - 04/08/17 1415    Visit Number  6    Number of Visits  12    Date for PT Re-Evaluation  04/23/17    Authorization Type  MCR    PT Start Time  0215    PT Stop Time  0300    PT Time Calculation (min)  45 min    Activity Tolerance  Patient tolerated treatment well;No increased pain    Behavior During Therapy  WFL for tasks assessed/performed       Past Medical History:  Diagnosis Date  . Abnormal Pap smear   . ALLERGIC RHINITIS 10/22/2006  . ANEMIA 12/18/2008  . Arthritis   . ASYMPTOMATIC POSTMENOPAUSAL STATUS 11/22/2007  . Back pain   . Complication of anesthesia   . Degenerative arthritis   . DIABETES MELLITUS, TYPE II 01/13/2007  . Dyslipidemia   . Fibroid   . GERD 07/20/2007  . GOITER, MULTINODULAR 07/20/2007  . Headache(784.0) 07/20/2007  . HEARING LOSS 11/22/2007  . Hiatal hernia   . Hx of colposcopy with cervical biopsy   . HYPERCHOLESTEROLEMIA 01/13/2007  . Hyperglycemia   . HYPERTENSION 10/22/2006  . NASH (nonalcoholic steatohepatitis)   . Nocturnal hypoxemia 02/17/2013  . Obesity   . Obstructive sleep apnea   . OSTEOARTHRITIS 10/22/2006  . Other chronic nonalcoholic liver disease 7/32/2025  . Sleep apnea     Past Surgical History:  Procedure Laterality Date  . BREAST SURGERY     Breast reduction  . CATARACT EXTRACTION, BILATERAL    . DEXA  08/2005  . DILATION AND CURETTAGE OF UTERUS    . ELECTROCARDIOGRAM  10/15/2006  . ESOPHAGOGASTRODUODENOSCOPY  12/08/2005  . Stress Cardiolite  10/21/2005  . sweat gland removal    . WISDOM TOOTH EXTRACTION      There were no vitals filed for this visit.  Subjective Assessment -  04/08/17 1420    Subjective  no pain. Over flu now . >24 hours without symptoms.   She feels a little more flexible , stepped on step with RT foot  which is new.  Maybe less knee pain.     Currently in Pain?  No/denies         [facility] PT Assessment - 04/08/17 0001      Berg Balance Test   Sit to Stand  Able to stand  independently using hands    Standing Unsupported  Able to stand safely 2 minutes    Sitting with Back Unsupported but Feet Supported on Floor or Stool  Able to sit safely and securely 2 minutes    Stand to Sit  Controls descent by using hands    Transfers  Able to transfer safely, definite need of hands    Standing Unsupported with Eyes Closed  Able to stand 10 seconds safely    Standing Ubsupported with Feet Together  Able to place feet together independently and stand 1 minute safely    From Standing, Reach Forward with Outstretched Arm  Can reach confidently >25 cm (10")    From Standing Position, Pick up Object from Floor  Able to pick up shoe safely and easily    From Standing Position, Turn to Look Behind  Over each Shoulder  Looks behind from both sides and weight shifts well    Turn 360 Degrees  Able to turn 360 degrees safely one side only in 4 seconds or less    Standing Unsupported, Alternately Place Feet on Step/Stool  Able to stand independently and complete 8 steps >20 seconds    Standing Unsupported, One Foot in Front  Able to plae foot ahead of the other independently and hold 30 seconds    Standing on One Leg  Able to lift leg independently and hold 5-10 seconds    Total Score  49    Berg comment:  Improved 4 points                   [facility] Adult PT Treatment/Exercise - 04/08/17 0001      Knee/Hip Exercises: Aerobic   Nustep  L5 LE only x 6 minutes       Knee/Hip Exercises: Standing   Heel Raises Limitations  heel anmd toe raises x 25    Hip Abduction  Right;Left;15 reps    Lateral Step Up  Right;Left;Limitations    Lateral Step Up  Limitations  12 reps    8 in    Functional Squat  Limitations    Functional Squat Limitations  with hold to pole at pulley x 12 reps    Other Standing Knee Exercises  close to tandem s able for 30 sec ,  with shoulder rotation RT/LT                 PT Short Term Goals - 04/08/17 1429      PT SHORT TERM GOAL #1   Title  independent with initial HEP    Status  Achieved      PT SHORT TERM GOAL #2   Title  Report pain decrease at rest from 6/10 to 3/10    Status  Achieved      PT SHORT TERM GOAL #3   Title  Demonstrate and verbalize understanding of condition management including RICE, positioning, use of AD, HEP    Status  Achieved      PT SHORT TERM GOAL #4   Title  "Demonstrate understanding of proper sitting posture and be more conscious of position and posture throughout the day.     Status  Achieved        PT Long Term Goals - 04/08/17 1430      PT LONG TERM GOAL #1   Title  "Pt will be independent with advanced HEP    Status  On-going      PT LONG TERM GOAL #2   Title  "FOTO will improve from 66% limitation  to  54%  limited or less indicating improved functional mobility    Status  Unable to assess      PT LONG TERM GOAL #3   Title  BERG score witll improve to 50/56 to decr risk of fall    Baseline  49/56 today    Status  On-going      PT LONG TERM GOAL #4   Title  Pt will increase R hip and knee AROM in order to don socks with pain level 2/10 or less    Baseline  able to don sock independnetly    Status  Achieved      PT LONG TERM GOAL #5   Title  Pt will demonstrate sit to stand form chair without using compensatory motions(blocking knee, momentum) x 5  Baseline  needs ue assist    Status  On-going            Plan - 04/08/17 1415    Clinical Impression Statement  no pain .     PT Treatment/Interventions  Cryotherapy;Moist Heat;Iontophoresis 94m/ml Dexamethasone;Ultrasound;Therapeutic exercise;Manual techniques;Patient/family  education;Functional mobility training    PT Next Visit Plan  HEP for strength in hips and quads. , balance exercises. heat as needed ionto if no issues; review new HEP    PT Home Exercise Plan  SLR ,  bent knee raise, bridge, stand heel toe raise. sit-stand (elevated seat), standing hip abduction, SLS and tandem trials at counter     Consulted and Agree with Plan of Care  Patient       Patient will benefit from skilled therapeutic intervention in order to improve the following deficits and impairments:  Pain, Obesity, Difficulty walking, Increased edema, Decreased balance, Decreased activity tolerance, Decreased range of motion, Decreased strength, Decreased endurance, Postural dysfunction  Visit Diagnosis: Chronic knee pain, unspecified laterality  Muscle weakness (generalized)  Stiffness of right knee, not elsewhere classified  Stiffness of left knee, not elsewhere classified  Difficulty in walking, not elsewhere classified  Unsteadiness on feet     Problem List Patient Active Problem List   Diagnosis Date Noted  . Type 2 diabetes mellitus without complication, without long-term current use of insulin ([place] 02/15/2017  . Bilateral leg cramps 01/05/2017  . High serum vitamin B12 01/05/2017  . Class 3 severe obesity with serious comorbidity and body mass index (BMI) of 40.0 to 44.9 in adult ([facility] 01/05/2017  . Peripheral neuropathy 05/27/2016  . Screen for colon cancer 04/02/2016  . Elevated sed rate 03/05/2016  . RLS (restless legs syndrome) 02/12/2016  . Muscle spasm of both lower legs 02/12/2016  . Edema of both ankles 02/12/2016  . Iron deficiency anemia 02/12/2016  . Hypokalemia 02/12/2016  . Pernicious anemia 02/12/2016  . Degenerative disc disease, lumbar 04/09/2015  . Degenerative arthritis of knee, bilateral 03/13/2015  . Chronic low back pain 03/13/2015  . Diabetes ([place] 02/15/2015  . Loss of weight 08/01/2014  . Coronary atherosclerosis of native coronary  artery 10/31/2013  . Incidental lung nodule, > 323mand < 19m319mLeft lower lobe 7mm26mJune 2014, April 2015 unchanged 06/25/2013  . OSA on CPAP 06/21/2013  . 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 Myalgia and myositis 05/02/2013  . Muscle ache 04/03/2013  . Nocturnal hypoxemia 02/17/2013  . Morbid obesity (H[facility])[place]/25/2014  . Hiatal hernia seen on CT 08/24/2012  .  LLL nodule 6mm [place] 2014, described in 2003 07/13/2012  . Asthma, chronic 06/29/2012  . Arthralgia 06/02/2012  . Unspecified asthma(493.90) 06/02/2012  . Hypersomnia with sleep apnea, unspecified 03/11/2012  . Other alteration of consciousness 03/11/2012  . Migraine without aura 03/11/2012  . Encounter for long-term (current) use of other medications 12/28/2011  . Dyspnea 12/28/2011  . Screening examination for infectious disease 12/28/2011  . Hx of fibroids 08/03/2011  . Knee pain, left 12/25/2010  . NECK PAIN 12/03/2009  . EDEMA 12/18/2008  . TINNITUS 11/22/2007  . HEARING LOSS 11/22/2007  . KNEE PAIN, RIGHT 11/22/2007  . ASYMPTOMATIC POSTMENOPAUSAL STATUS 11/22/2007  . GOITER, MULTINODULAR 07/20/2007  . GERD 07/20/2007  . NASH (nonalcoholic steatohepatitis) 07/20/2007  . Headache 07/20/2007  . HYPERCHOLESTEROLEMIA 01/13/2007  . ANXIETY 10/22/2006  . Essential hypertension 10/22/2006  . ALLERGIC RHINITIS 10/22/2006  . Osteoarthritis 10/22/2006    ChassDarrel Hoover2/08/2017, 3:02 PM  [place], [place], [address] Phone: [phone]   Fax:  [phone]  Name: Heather [name] MRN: [id number] Date of Birth: [birth year]

## 2017-04-13 ENCOUNTER — Ambulatory Visit: Payer: Medicare Other | Admitting: Physical Therapy

## 2017-04-13 ENCOUNTER — Encounter: Payer: Self-pay | Admitting: Physical Therapy

## 2017-04-13 DIAGNOSIS — M6281 Muscle weakness (generalized): Secondary | ICD-10-CM | POA: Diagnosis not present

## 2017-04-13 DIAGNOSIS — M25569 Pain in unspecified knee: Secondary | ICD-10-CM | POA: Diagnosis not present

## 2017-04-13 DIAGNOSIS — M25661 Stiffness of right knee, not elsewhere classified: Secondary | ICD-10-CM | POA: Diagnosis not present

## 2017-04-13 DIAGNOSIS — M25662 Stiffness of left knee, not elsewhere classified: Secondary | ICD-10-CM | POA: Diagnosis not present

## 2017-04-13 DIAGNOSIS — G8929 Other chronic pain: Secondary | ICD-10-CM | POA: Diagnosis not present

## 2017-04-13 DIAGNOSIS — R2681 Unsteadiness on feet: Secondary | ICD-10-CM

## 2017-04-13 DIAGNOSIS — R262 Difficulty in walking, not elsewhere classified: Secondary | ICD-10-CM | POA: Diagnosis not present

## 2017-04-13 NOTE — Therapy (Signed)
[place], [place], [address] Phone: [phone]   Fax:  [phone]  Physical Therapy Treatment  Patient Details  Name: Heather [name] MRN: [id number] Date of Birth: 1945/12/12 Referring Provider: Hulan Saas DO   Encounter Date: 04/13/2017  PT End of Session - 04/13/17 1611    Visit Number  7    Number of Visits  12    Date for PT Re-Evaluation  04/23/17    Authorization Type  MCR    PT Start Time  0215    PT Stop Time  0300    PT Time Calculation (min)  45 min       Past Medical History:  Diagnosis Date  . Abnormal Pap smear   . ALLERGIC RHINITIS 10/22/2006  . ANEMIA 12/18/2008  . Arthritis   . ASYMPTOMATIC POSTMENOPAUSAL STATUS 11/22/2007  . Back pain   . Complication of anesthesia   . Degenerative arthritis   . DIABETES MELLITUS, TYPE II 01/13/2007  . Dyslipidemia   . Fibroid   . GERD 07/20/2007  . GOITER, MULTINODULAR 07/20/2007  . Headache(784.0) 07/20/2007  . HEARING LOSS 11/22/2007  . Hiatal hernia   . Hx of colposcopy with cervical biopsy   . HYPERCHOLESTEROLEMIA 01/13/2007  . Hyperglycemia   . HYPERTENSION 10/22/2006  . NASH (nonalcoholic steatohepatitis)   . Nocturnal hypoxemia 02/17/2013  . Obesity   . Obstructive sleep apnea   . OSTEOARTHRITIS 10/22/2006  . Other chronic nonalcoholic liver disease 07/10/2583  . Sleep apnea     Past Surgical History:  Procedure Laterality Date  . BREAST SURGERY     Breast reduction  . CATARACT EXTRACTION, BILATERAL    . DEXA  08/2005  . DILATION AND CURETTAGE OF UTERUS    . ELECTROCARDIOGRAM  10/15/2006  . ESOPHAGOGASTRODUODENOSCOPY  12/08/2005  . Stress Cardiolite  10/21/2005  . sweat gland removal    . WISDOM TOOTH EXTRACTION      There were no vitals filed for this visit.  Subjective Assessment - 04/13/17 1450    Subjective  Pt arrives with information on home Nustep options.     Currently in Pain?  No/denies                       [facility] Adult PT Treatment/Exercise - 04/13/17 0001      Self-Care   Self-Care  Other Self-Care Comments    Other Self-Care Comments   Answered questions about purchase of seated stepper she found on [place].       Knee/Hip Exercises: Aerobic   Nustep  L5 LE only x 10 minutes       Knee/Hip Exercises: Standing   Other Standing Knee Exercises  tandem trials 30-40 sec without UE, able to get into and out of positions with out UE assist. Alternating and unilateral step taps without UE support, no difficulty       Knee/Hip Exercises: Seated   Sit to Sand  5 reps;without UE support               PT Short Term Goals - 04/08/17 1429      PT SHORT TERM GOAL #1   Title  independent with initial HEP    Status  Achieved      PT SHORT TERM GOAL #2   Title  Report pain decrease at rest from 6/10 to 3/10    Status  Achieved      PT SHORT TERM GOAL #3  Title  Demonstrate and verbalize understanding of condition management including RICE, positioning, use of AD, HEP    Status  Achieved      PT SHORT TERM GOAL #4   Title  "Demonstrate understanding of proper sitting posture and be more conscious of position and posture throughout the day.     Status  Achieved        PT Long Term Goals - 04/08/17 1430      PT LONG TERM GOAL #1   Title  "Pt will be independent with advanced HEP    Status  On-going      PT LONG TERM GOAL #2   Title  "FOTO will improve from 66% limitation  to  54%  limited or less indicating improved functional mobility    Status  Unable to assess      PT LONG TERM GOAL #3   Title  BERG score witll improve to 50/56 to decr risk of fall    Baseline  49/56 today    Status  On-going      PT LONG TERM GOAL #4   Title  Pt will increase R hip and knee AROM in order to don socks with pain level 2/10 or less    Baseline  able to don sock independnetly    Status  Achieved      PT LONG TERM GOAL #5   Title  Pt will demonstrate sit  to stand form chair without using compensatory motions(blocking knee, momentum) x 5    Baseline  needs ue assist    Status  On-going            Plan - 04/13/17 1500    Clinical Impression Statement  Able to stand up and sit down without UE support from mat table lowered to standard chair height x 5.  Worked on balance tasks to perform latest BERG score. Pt did well with step taps and tandem stance today. SHe plans to purchase seated stepper.     PT Next Visit Plan  FOTO, recheck BERG; HEP for strength in hips and quads. , balance exercises. heat as needed ionto if no issues; review new HEP- finalize advanced HEP     PT Home Exercise Plan  SLR ,  bent knee raise, bridge, stand heel toe raise. sit-stand (elevated seat), standing hip abduction, SLS and tandem trials at counter     Consulted and Agree with Plan of Care  Patient       Patient will benefit from skilled therapeutic intervention in order to improve the following deficits and impairments:  Pain, Obesity, Difficulty walking, Increased edema, Decreased balance, Decreased activity tolerance, Decreased range of motion, Decreased strength, Decreased endurance, Postural dysfunction  Visit Diagnosis: Muscle weakness (generalized)  Chronic knee pain, unspecified laterality  Stiffness of right knee, not elsewhere classified  Stiffness of left knee, not elsewhere classified  Difficulty in walking, not elsewhere classified  Unsteadiness on feet     Problem List Patient Active Problem List   Diagnosis Date Noted  . Type 2 diabetes mellitus without complication, without long-term current use of insulin ([place]) 02/15/2017  . Bilateral leg cramps 01/05/2017  . High serum vitamin B12 01/05/2017  . Class 3 severe obesity with serious comorbidity and body mass index (BMI) of 40.0 to 44.9 in adult ([place]) 01/05/2017  . Peripheral neuropathy 05/27/2016  . Screen for colon cancer 04/02/2016  . Elevated sed rate 03/05/2016  . RLS  (restless legs syndrome) 02/12/2016  . Muscle spasm  of both lower legs 02/12/2016  . Edema of both ankles 02/12/2016  . Iron deficiency anemia 02/12/2016  . Hypokalemia 02/12/2016  . Pernicious anemia 02/12/2016  . Degenerative disc disease, lumbar 04/09/2015  . Degenerative arthritis of knee, bilateral 03/13/2015  . Chronic low back pain 03/13/2015  . Diabetes ([place]) 02/15/2015  . Loss of weight 08/01/2014  . Coronary atherosclerosis of native coronary artery 10/31/2013  . Incidental lung nodule, > 33m and < 835m Left lower lobe 44m38m June 2014, April 2015 unchanged 06/25/2013  . OSA on CPAP 06/21/2013  . 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 Myalgia and myositis 05/02/2013  . Muscle ache 04/03/2013  . Nocturnal hypoxemia 02/17/2013  . Morbid obesity ([facility]1/25/2014  . Hiatal hernia seen on CT 08/24/2012  .  LLL nodule 6mm89mJune 2014, described in 2003 07/13/2012  . Asthma, chronic 06/29/2012  . Arthralgia 06/02/2012  . Unspecified asthma(493.90) 06/02/2012  . Hypersomnia with sleep apnea, unspecified 03/11/2012  . Other alteration of consciousness 03/11/2012  . Migraine without aura 03/11/2012  . Encounter for long-term (current) use of other medications 12/28/2011  . Dyspnea 12/28/2011  . Screening examination for infectious disease 12/28/2011  . Hx of fibroids 08/03/2011  . Knee pain, left 12/25/2010  . NECK PAIN 12/03/2009  . EDEMA 12/18/2008  . TINNITUS 11/22/2007  . HEARING LOSS 11/22/2007  . KNEE PAIN, RIGHT 11/22/2007  . ASYMPTOMATIC POSTMENOPAUSAL STATUS 11/22/2007  . GOITER, MULTINODULAR 07/20/2007  . GERD 07/20/2007  . NASH (nonalcoholic steatohepatitis) 07/20/2007  . Headache 07/20/2007  . HYPERCHOLESTEROLEMIA 01/13/2007  . ANXIETY 10/22/2006  . Essential hypertension 10/22/2006  . ALLERGIC RHINITIS 10/22/2006  . Osteoarthritis 10/22/2006    DonoDorene ArA 04/13/2017, 4:15 PM  [facility], [id number]: [phone]:  [phone]: GretHOLLIDAY SHEAFFER: [phone] of Birth: 09/2304-May-1947

## 2017-04-14 ENCOUNTER — Ambulatory Visit (INDEPENDENT_AMBULATORY_CARE_PROVIDER_SITE_OTHER): Payer: Medicare Other | Admitting: Family Medicine

## 2017-04-14 ENCOUNTER — Other Ambulatory Visit: Payer: Self-pay

## 2017-04-14 ENCOUNTER — Encounter: Payer: Self-pay | Admitting: Family Medicine

## 2017-04-14 DIAGNOSIS — M5136 Other intervertebral disc degeneration, lumbar region: Secondary | ICD-10-CM | POA: Diagnosis not present

## 2017-04-14 MED ORDER — GABAPENTIN 100 MG PO CAPS: ORAL_CAPSULE | ORAL | 1 refills | Status: DC

## 2017-04-14 MED ORDER — VENLAFAXINE HCL ER 37.5 MG PO CP24: 37.5000 mg | ORAL_CAPSULE | Freq: Every day | ORAL | 1 refills | Status: DC

## 2017-04-14 NOTE — Assessment & Plan Note (Signed)
Patient is doing remarkably better at this time.  Knee pain in all her other pain seem to be improving.  The same medications including the Effexor and the gabapentin.  Patient will finish up with physical therapy soon.  Patient will be try to get some low impact home exercise equipment that I think will be beneficial.  We discussed icing regimen.  Follow-up again in 3 months.

## 2017-04-14 NOTE — Patient Instructions (Signed)
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

## 2017-04-14 NOTE — Progress Notes (Signed)
[name] [place], [place] [address] Phone: [phone] Subjective:     CC: Bilateral knee pain, back pain follow-up  KTG:[id number]  Heather [name] is a 72 y.o. female coming in with complaint of bilateral knee pain. She said that she is doing great. She has to be careful on the stairs due to a lack of strength. She also feels that she has a decrease in muscular endurance which bothers her knees. After physical therapy she is not having pain and does feel that it is working. If patient does sit for prolonged periods she does have stiffness.       Past Medical History:  Diagnosis Date  . Abnormal Pap smear   . ALLERGIC RHINITIS 10/22/2006  . ANEMIA 12/18/2008  . Arthritis   . ASYMPTOMATIC POSTMENOPAUSAL STATUS 11/22/2007  . Back pain   . Complication of anesthesia   . Degenerative arthritis   . DIABETES MELLITUS, TYPE II 01/13/2007  . Dyslipidemia   . Fibroid   . GERD 07/20/2007  . GOITER, MULTINODULAR 07/20/2007  . Headache(784.0) 07/20/2007  . HEARING LOSS 11/22/2007  . Hiatal hernia   . Hx of colposcopy with cervical biopsy   . HYPERCHOLESTEROLEMIA 01/13/2007  . Hyperglycemia   . HYPERTENSION 10/22/2006  . NASH (nonalcoholic steatohepatitis)   . Nocturnal hypoxemia 02/17/2013  . Obesity   . Obstructive sleep apnea   . OSTEOARTHRITIS 10/22/2006  . Other chronic nonalcoholic liver disease 2/87/6811  . Sleep apnea    Past Surgical History:  Procedure Laterality Date  . BREAST SURGERY     Breast reduction  . CATARACT EXTRACTION, BILATERAL    . DEXA  08/2005  . DILATION AND CURETTAGE OF UTERUS    . ELECTROCARDIOGRAM  10/15/2006  . ESOPHAGOGASTRODUODENOSCOPY  12/08/2005  . Stress Cardiolite  10/21/2005  . sweat gland removal    . WISDOM TOOTH EXTRACTION     Social History   Socioeconomic History  . Marital status: Married    Spouse name: [name]  . Number of children: 2  . Years of education: College  . Highest  education level: None  Social Needs  . Financial resource strain: None  . Food insecurity - worry: None  . Food insecurity - inability: None  . Transportation needs - medical: None  . Transportation needs - non-medical: None  Occupational History  . Occupation: Retired  Tobacco Use  . Smoking status: Former Smoker    Packs/day: 2.00    Years: 20.00    Pack years: 40.00    Types: Cigarettes    Last attempt to quit: 03/03/1979    Years since quitting: 38.1  . Smokeless tobacco: Never Used  Substance and Sexual Activity  . Alcohol use: Yes    Comment: rare  . Drug use: No  . Sexual activity: Yes    Birth control/protection: Surgical  Other Topics Concern  . None  Social History Narrative   Patient is married [name]).   Patient has two children.   Patient is retired.   Patient has a college education.   Patient is right-handed.   Patient lives at home with family.   Caffeine Use: 2 soda every other day   Allergies  Allergen Reactions  . Aspirin   . Coconut Oil   . Latex   . Lisinopril     REACTION: cough  . Metoprolol Itching  . Peanut-Containing Drug Products   . Penicillins     REACTION: rash  . Prednisone   .  Strawberry Extract    Family History  Problem Relation Age of Onset  . Asthma Mother   . Depression Mother   . Bipolar disorder Mother   . Dementia Mother   . Arthritis Mother   . Breast cancer Mother        primary  . Colon cancer Mother        mets from breast  . Cancer Mother   . Hypertension Father   . Migraines Father   . Cancer Maternal Aunt   . Heart disease Maternal Grandmother   . Cancer Maternal Grandfather      Past medical history, social, surgical and family history all reviewed in electronic medical record.  No pertanent information unless stated regarding to the chief complaint.   Review of Systems:Review of systems updated and as accurate as of 04/14/17  No headache, visual changes, nausea, vomiting, diarrhea, constipation,  dizziness, abdominal pain, skin rash, fevers, chills, night sweats, weight loss, swollen lymph nodes, body aches, joint swelling, muscle aches, chest pain, shortness of breath, mood changes.   Objective  Blood pressure 106/78, pulse 81, height 5' 4.5" (1.638 m), weight 235 lb (106.6 kg), SpO2 96 %. Systems examined below as of 04/14/17   General: No apparent distress alert and oriented x3 mood and affect normal, dressed appropriately.  HEENT: Pupils equal, extraocular movements intact  Respiratory: Patient's speak in full sentences and does not appear short of breath  Cardiovascular: No lower extremity edema, non tender, no erythema  Skin: Warm dry intact with no signs of infection or rash on extremities or on axial skeleton.  Abdomen: Soft nontender  Neuro: Cranial nerves II through XII are intact, neurovascularly intact in all extremities with 2+ DTRs and 2+ pulses.  Lymph: No lymphadenopathy of posterior or anterior cervical chain or axillae bilaterally.  Gait normal with good balance and coordination.  MSK:  Non tender with full range of motion and good stability and symmetric strength and tone of shoulders, elbows, wrist, hip, and ankles bilaterally.  Mild arthritic changes noted. Knee: Bilateral valgus deformity noted. Large thigh to calf ratio.  Tender to palpation over medial and PF joint line.  ROM full in flexion and extension and lower leg rotation. instability with valgus force.  painful patellar compression. Patellar glide with mild crepitus. Patellar and quadriceps tendons unremarkable. Hamstring and quadriceps strength is normal.  Back exam shows some mild diffuse tenderness to palpation of the paraspinal musculature.  No spinous process tenderness.  Mild tightness with Corky Sox bilaterally    Impression and Recommendations:     This case required medical decision making of moderate complexity.      Note: This dictation was prepared with Dragon dictation along with  smaller phrase technology. Any transcriptional errors that result from this process are unintentional.

## 2017-04-15 ENCOUNTER — Ambulatory Visit: Payer: Medicare Other

## 2017-04-15 ENCOUNTER — Ambulatory Visit (INDEPENDENT_AMBULATORY_CARE_PROVIDER_SITE_OTHER): Payer: Medicare Other | Admitting: Podiatry

## 2017-04-15 ENCOUNTER — Ambulatory Visit (INDEPENDENT_AMBULATORY_CARE_PROVIDER_SITE_OTHER): Payer: Medicare Other

## 2017-04-15 ENCOUNTER — Encounter: Payer: Self-pay | Admitting: Podiatry

## 2017-04-15 DIAGNOSIS — M2011 Hallux valgus (acquired), right foot: Secondary | ICD-10-CM | POA: Diagnosis not present

## 2017-04-15 DIAGNOSIS — G8929 Other chronic pain: Secondary | ICD-10-CM | POA: Diagnosis not present

## 2017-04-15 DIAGNOSIS — M25661 Stiffness of right knee, not elsewhere classified: Secondary | ICD-10-CM | POA: Diagnosis not present

## 2017-04-15 DIAGNOSIS — M6281 Muscle weakness (generalized): Secondary | ICD-10-CM | POA: Diagnosis not present

## 2017-04-15 DIAGNOSIS — R32 Unspecified urinary incontinence: Secondary | ICD-10-CM | POA: Insufficient documentation

## 2017-04-15 DIAGNOSIS — M2041 Other hammer toe(s) (acquired), right foot: Secondary | ICD-10-CM

## 2017-04-15 DIAGNOSIS — M25569 Pain in unspecified knee: Secondary | ICD-10-CM

## 2017-04-15 DIAGNOSIS — R262 Difficulty in walking, not elsewhere classified: Secondary | ICD-10-CM

## 2017-04-15 DIAGNOSIS — M25662 Stiffness of left knee, not elsewhere classified: Secondary | ICD-10-CM

## 2017-04-15 DIAGNOSIS — E049 Nontoxic goiter, unspecified: Secondary | ICD-10-CM | POA: Insufficient documentation

## 2017-04-15 DIAGNOSIS — R2681 Unsteadiness on feet: Secondary | ICD-10-CM

## 2017-04-15 NOTE — Therapy (Signed)
[place], [place], [address] Phone: [phone]   Fax:  [phone]  Physical Therapy Treatment  Patient Details  Name: Heather [name] MRN: [id number] Date of Birth: 1945/07/07 Referring Provider: Hulan Saas DO   Encounter Date: 04/15/2017  PT End of Session - 04/15/17 1431    Visit Number  8    Number of Visits  12    Date for PT Re-Evaluation  04/23/17    Authorization Type  MCR    PT Start Time  0230 late 13 min    PT Stop Time  0300    PT Time Calculation (min)  30 min    Activity Tolerance  Patient tolerated treatment well;No increased pain    Behavior During Therapy  WFL for tasks assessed/performed       Past Medical History:  Diagnosis Date  . Abnormal Pap smear   . ALLERGIC RHINITIS 10/22/2006  . ANEMIA 12/18/2008  . Arthritis   . ASYMPTOMATIC POSTMENOPAUSAL STATUS 11/22/2007  . Back pain   . Complication of anesthesia   . Degenerative arthritis   . DIABETES MELLITUS, TYPE II 01/13/2007  . Dyslipidemia   . Fibroid   . GERD 07/20/2007  . GOITER, MULTINODULAR 07/20/2007  . Headache(784.0) 07/20/2007  . HEARING LOSS 11/22/2007  . Hiatal hernia   . Hx of colposcopy with cervical biopsy   . HYPERCHOLESTEROLEMIA 01/13/2007  . Hyperglycemia   . HYPERTENSION 10/22/2006  . NASH (nonalcoholic steatohepatitis)   . Nocturnal hypoxemia 02/17/2013  . Obesity   . Obstructive sleep apnea   . OSTEOARTHRITIS 10/22/2006  . Other chronic nonalcoholic liver disease 7/32/2025  . Sleep apnea     Past Surgical History:  Procedure Laterality Date  . BREAST SURGERY     Breast reduction  . CATARACT EXTRACTION, BILATERAL    . DEXA  08/2005  . DILATION AND CURETTAGE OF UTERUS    . ELECTROCARDIOGRAM  10/15/2006  . ESOPHAGOGASTRODUODENOSCOPY  12/08/2005  . Stress Cardiolite  10/21/2005  . sweat gland removal    . WISDOM TOOTH EXTRACTION      There were no vitals filed for this visit.  Subjective  Assessment - 04/15/17 1431    Subjective  No complaints . Knees doing well . Alot of appointmenst today so late    Currently in Pain?  No/denies         [facility] PT Assessment - 04/15/17 0001      Observation/Other Assessments   Focus on Therapeutic Outcomes (FOTO)   62% limited 4 points better                  [facility] Adult PT Treatment/Exercise - 04/15/17 0001      Neuro Re-ed    Neuro Re-ed Details   worked on single leg stability with forward anmd back ward and lateral stepping  with 3-5 reps before transition to opposite leg  multiple reps      Knee/Hip Exercises: Aerobic   Nustep  L5 LE only x 10 minutes     Stepper  L6 LE only  6 min      Knee/Hip Exercises: Standing   Heel Raises Limitations  heel and toe raises x 25      Knee/Hip Exercises: Seated   Sit to Sand  without UE support;10 reps             PT Education - 04/15/17 1512    Education provided  Yes    Education Details  benefits of standing and stepping with full weight  with balance and strength    Person(s) Educated  Patient    Methods  Explanation;Demonstration;Verbal cues    Comprehension  Returned demonstration;Verbalized understanding       PT Short Term Goals - 04/08/17 1429      PT SHORT TERM GOAL #1   Title  independent with initial HEP    Status  Achieved      PT SHORT TERM GOAL #2   Title  Report pain decrease at rest from 6/10 to 3/10    Status  Achieved      PT SHORT TERM GOAL #3   Title  Demonstrate and verbalize understanding of condition management including RICE, positioning, use of AD, HEP    Status  Achieved      PT SHORT TERM GOAL #4   Title  "Demonstrate understanding of proper sitting posture and be more conscious of position and posture throughout the day.     Status  Achieved        PT Long Term Goals - 04/08/17 1430      PT LONG TERM GOAL #1   Title  "Pt will be independent with advanced HEP    Status  On-going      PT LONG TERM GOAL #2   Title   "FOTO will improve from 66% limitation  to  54%  limited or less indicating improved functional mobility    Status  Unable to assess      PT LONG TERM GOAL #3   Title  BERG score witll improve to 50/56 to decr risk of fall    Baseline  49/56 today    Status  On-going      PT LONG TERM GOAL #4   Title  Pt will increase R hip and knee AROM in order to don socks with pain level 2/10 or less    Baseline  able to don sock independnetly    Status  Achieved      PT LONG TERM GOAL #5   Title  Pt will demonstrate sit to stand form chair without using compensatory motions(blocking knee, momentum) x 5    Baseline  needs ue assist    Status  On-going            Plan - 04/15/17 1432    Clinical Impression Statement  Plan to finish POC then discharge. Suggested if balance is concern she can get MD to refer her to  Neuro rehab for balance treatment.     PT Treatment/Interventions  Cryotherapy;Moist Heat;Iontophoresis 76m/ml Dexamethasone;Ultrasound;Therapeutic exercise;Manual techniques;Patient/family education;Functional mobility training    PT Next Visit Plan  , recheck BERG; HEP for strength/ balance in hips and quads. , balance exercises. heat as needed ionto if no issues; review new HEP- finalize advanced HEP     PT Home Exercise Plan  SLR ,  bent knee raise, bridge, stand heel toe raise. sit-stand (elevated seat), standing hip abduction, SLS and tandem trials at counter     Consulted and Agree with Plan of Care  Patient       Patient will benefit from skilled therapeutic intervention in order to improve the following deficits and impairments:  Pain, Obesity, Difficulty walking, Increased edema, Decreased balance, Decreased activity tolerance, Decreased range of motion, Decreased strength, Decreased endurance, Postural dysfunction  Visit Diagnosis: Muscle weakness (generalized)  Chronic knee pain, unspecified laterality  Stiffness of right knee, not elsewhere classified  Stiffness of  left knee, not  elsewhere classified  Difficulty in walking, not elsewhere classified  Unsteadiness on feet     Problem List Patient Active Problem List   Diagnosis Date Noted  . Goiter 04/15/2017  . Urinary incontinence 04/15/2017  . Type 2 diabetes mellitus without complication, without long-term current use of insulin ([place]) 02/15/2017  . Bilateral leg cramps 01/05/2017  . High serum vitamin B12 01/05/2017  . Class 3 severe obesity with serious comorbidity and body mass index (BMI) of 40.0 to 44.9 in adult ([place]) 01/05/2017  . Peripheral neuropathy 05/27/2016  . Screen for colon cancer 04/02/2016  . Elevated sed rate 03/05/2016  . RLS (restless legs syndrome) 02/12/2016  . Muscle spasm of both lower legs 02/12/2016  . Edema of both ankles 02/12/2016  . Iron deficiency anemia 02/12/2016  . Hypokalemia 02/12/2016  . Pernicious anemia 02/12/2016  . Degenerative disc disease, lumbar 04/09/2015  . Degenerative arthritis of knee, bilateral 03/13/2015  . Chronic low back pain 03/13/2015  . Diabetes ([place]) 02/15/2015  . Loss of weight 08/01/2014  . Coronary atherosclerosis of native coronary artery 10/31/2013  . Incidental lung nodule, > 76m and < 858m Left lower lobe 12m72m June 2014, April 2015 unchanged 06/25/2013  . OSA on CPAP 06/21/2013  . 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 Myalgia and myositis 05/02/2013  . Muscle ache 04/03/2013  . Nocturnal hypoxemia 02/17/2013  . Morbid obesity ([place]1/25/2014  . Hiatal hernia seen on CT 08/24/2012  .  LLL nodule 6mm18mJune 2014, described in 2003 07/13/2012  . Asthma, chronic 06/29/2012  . Arthralgia 06/02/2012  . Unspecified asthma(493.90) 06/02/2012  . Hypersomnia with sleep apnea, unspecified 03/11/2012  . Other alteration of consciousness 03/11/2012  . Migraine without aura 03/11/2012  . Encounter for long-term (current) use of other medications 12/28/2011  . Dyspnea 12/28/2011  . Screening examination for infectious disease  12/28/2011  . Hx of fibroids 08/03/2011  . Knee pain, left 12/25/2010  . NECK PAIN 12/03/2009  . EDEMA 12/18/2008  . TINNITUS 11/22/2007  . HEARING LOSS 11/22/2007  . KNEE PAIN, RIGHT 11/22/2007  . ASYMPTOMATIC POSTMENOPAUSAL STATUS 11/22/2007  . GOITER, MULTINODULAR 07/20/2007  . GERD 07/20/2007  . NASH (nonalcoholic steatohepatitis) 07/20/2007  . Headache 07/20/2007  . HYPERCHOLESTEROLEMIA 01/13/2007  . ANXIETY 10/22/2006  . Essential hypertension 10/22/2006  . ALLERGIC RHINITIS 10/22/2006  . Osteoarthritis 10/22/2006    ChasDarrel Hoover2/14/2019, 3:15 PM  [facility], [place]: [phone]:  [phone]: GretCHIZUKO TRINE: [id number] of Birth: [birth year]

## 2017-04-15 NOTE — Patient Instructions (Signed)
Pre-Operative Instructions  Congratulations, you have decided to take an important step towards improving your quality of life.  You can be assured that the doctors and staff at [facility] will be with you every step of the way.  Here are some important things you should know:  1. Plan to be at the surgery center/hospital at least 1 (one) hour prior to your scheduled time, unless otherwise directed by the surgical center/hospital staff.  You must have a responsible adult accompany you, remain during the surgery and drive you home.  Make sure you have directions to the surgical center/hospital to ensure you arrive on time. 2. If you are having surgery at [facility] or [facility], you will need a copy of your medical history and physical form from your family physician within one month prior to the date of surgery. We will give you a form for your primary physician to complete.  3. We make every effort to accommodate the date you request for surgery.  However, there are times where surgery dates or times have to be moved.  We will contact you as soon as possible if a change in schedule is required.   4. No aspirin/ibuprofen for one week before surgery.  If you are on aspirin, any non-steroidal anti-inflammatory medications (Mobic, Aleve, Ibuprofen) should not be taken seven (7) days prior to your surgery.  You make take Tylenol for pain prior to surgery.  5. Medications - If you are taking daily heart and blood pressure medications, seizure, reflux, allergy, asthma, anxiety, pain or diabetes medications, make sure you notify the surgery center/hospital before the day of surgery so they can tell you which medications you should take or avoid the day of surgery. 6. No food or drink after midnight the night before surgery unless directed otherwise by surgical center/hospital staff. 7. No alcoholic beverages 24-hours prior to surgery.  No smoking 24-hours prior or 24-hours after  surgery. 8. Wear loose pants or shorts. They should be loose enough to fit over bandages, boots, and casts. 9. Don't wear slip-on shoes. Sneakers are preferred. 10. Bring your boot with you to the surgery center/hospital.  Also bring crutches or a walker if your physician has prescribed it for you.  If you do not have this equipment, it will be provided for you after surgery. 11. If you have not been contacted by the surgery center/hospital by the day before your surgery, call to confirm the date and time of your surgery. 12. Leave-time from work may vary depending on the type of surgery you have.  Appropriate arrangements should be made prior to surgery with your employer. 13. Prescriptions will be provided immediately following surgery by your doctor.  Fill these as soon as possible after surgery and take the medication as directed. Pain medications will not be refilled on weekends and must be approved by the doctor. 14. Remove nail polish on the operative foot and avoid getting pedicures prior to surgery. 15. Wash the night before surgery.  The night before surgery wash the foot and leg well with water and the antibacterial soap provided. Be sure to pay special attention to beneath the toenails and in between the toes.  Wash for at least three (3) minutes. Rinse thoroughly with water and dry well with a towel.  Perform this wash unless told not to do so by your physician.  Enclosed: 1 Ice pack (please put in freezer the night before surgery)   1 Hibiclens skin cleaner     Pre-op instructions  If you have any questions regarding the instructions, please do not hesitate to call our office.  [place]: [address], [place], [place] [address] -- [phone]  [place]: [address] , [place] [address] -- [phone]  [place]: [address]  [place], [place] [address] -- [phone]  [place]: [address], [address], [place],  [address] -- [phone]  Website:  [email or web address] 

## 2017-04-17 NOTE — Progress Notes (Signed)
She presents today with a chief complaint of pain to the second digit of the right foot.  She states that this hammertoe has been present for a long time and is starting to rub the shoes and become more painful.  She feels that is starting to cross the hallux as well.  She states that she would like to have something performed to correct this because it is limiting her ability to perform her daily activities and is chronically painful.  She feels that she can maintain her health better if she could just be more active.  She feels at this time is limiting that activity.  Objective: Vital signs are stable she is alert and oriented x3 I have reviewed her past medical history medications allergy surgeries and social history.  Pulses are strongly palpable bilateral.  Neurologic sensorium is intact.  Deep tendon reflexes are intact.  Muscle strength was 5/5 dorsiflexors plantar flexors inverters everters onto the musculature is intact.  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  This hammertoe deformity is rigid in nature at the level of the PIPJ.  Somewhat flexible at the PIPJ.  Radiographs taken of the right foot today demonstrate an osseously mature individual bunion deformity right foot greater than of the left hammertoe deformity of the right foot greater than that of the left.  Long second metatarsal and osteoarthritic changes at the PIPJ of the second digit.  Mild osteoarthritic changes at the metatarsal phalangeal joint.  No acute findings are noted.  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  Plan: Discussed etiology pathology conservative versus surgical therapies.  At this point we went over a consent form today line by line number by number given her ample time to ask where she self it as well as her husband asking  questions regarding a [place] bunion repair with screw fixation and a hammertoe repair with screw fixation second digit of the right foot.  I answered all the questions regarding these procedures to the best of my ability in layman's terms.  Did indicate to them that is may be necessary to perform a second metatarsal osteotomy to get the toe to lay down they understand this and is amenable to it they want this performed if necessary.  Anything that is necessary to get the toe to lay down and for her foot to be more comfortable she would like to have that done.  We did discuss the possible postop complications which may include but are not limited to postop pain bleeding swelling infection recurrence need for further surgery overcorrection under correction loss of digit loss of limb loss of life chronic pain.  We dispensed a Cam walker today as well as with oral and written home-going instructions for the surgery center and anesthesia group.  I will follow-up with her in the near future for surgical intervention.

## 2017-04-20 ENCOUNTER — Ambulatory Visit: Payer: Medicare Other | Admitting: Physical Therapy

## 2017-04-20 ENCOUNTER — Encounter: Payer: Self-pay | Admitting: Physical Therapy

## 2017-04-20 DIAGNOSIS — M25662 Stiffness of left knee, not elsewhere classified: Secondary | ICD-10-CM | POA: Diagnosis not present

## 2017-04-20 DIAGNOSIS — G8929 Other chronic pain: Secondary | ICD-10-CM | POA: Diagnosis not present

## 2017-04-20 DIAGNOSIS — M25569 Pain in unspecified knee: Secondary | ICD-10-CM | POA: Diagnosis not present

## 2017-04-20 DIAGNOSIS — M25661 Stiffness of right knee, not elsewhere classified: Secondary | ICD-10-CM

## 2017-04-20 DIAGNOSIS — M6281 Muscle weakness (generalized): Secondary | ICD-10-CM

## 2017-04-20 DIAGNOSIS — R262 Difficulty in walking, not elsewhere classified: Secondary | ICD-10-CM | POA: Diagnosis not present

## 2017-04-20 DIAGNOSIS — R2681 Unsteadiness on feet: Secondary | ICD-10-CM

## 2017-04-20 NOTE — Therapy (Signed)
[place], [place], [address] Phone: [phone]   Fax:  [phone]  Physical Therapy Treatment  Patient Details  Name: Heather [name] MRN: [id number] Date of Birth: 1945/11/11 Referring Provider: Hulan Saas DO   Encounter Date: 04/20/2017  PT End of Session - 04/20/17 1355    Visit Number  9    Number of Visits  12    Date for PT Re-Evaluation  04/23/17    Authorization Type  MCR    PT Start Time  0133    PT Stop Time  0215    PT Time Calculation (min)  42 min       Past Medical History:  Diagnosis Date  . Abnormal Pap smear   . ALLERGIC RHINITIS 10/22/2006  . ANEMIA 12/18/2008  . Arthritis   . ASYMPTOMATIC POSTMENOPAUSAL STATUS 11/22/2007  . Back pain   . Complication of anesthesia   . Degenerative arthritis   . DIABETES MELLITUS, TYPE II 01/13/2007  . Dyslipidemia   . Fibroid   . GERD 07/20/2007  . GOITER, MULTINODULAR 07/20/2007  . Headache(784.0) 07/20/2007  . HEARING LOSS 11/22/2007  . Hiatal hernia   . Hx of colposcopy with cervical biopsy   . HYPERCHOLESTEROLEMIA 01/13/2007  . Hyperglycemia   . HYPERTENSION 10/22/2006  . NASH (nonalcoholic steatohepatitis)   . Nocturnal hypoxemia 02/17/2013  . Obesity   . Obstructive sleep apnea   . OSTEOARTHRITIS 10/22/2006  . Other chronic nonalcoholic liver disease 08/28/6379  . Sleep apnea     Past Surgical History:  Procedure Laterality Date  . BREAST SURGERY     Breast reduction  . CATARACT EXTRACTION, BILATERAL    . DEXA  08/2005  . DILATION AND CURETTAGE OF UTERUS    . ELECTROCARDIOGRAM  10/15/2006  . ESOPHAGOGASTRODUODENOSCOPY  12/08/2005  . Stress Cardiolite  10/21/2005  . sweat gland removal    . WISDOM TOOTH EXTRACTION      There were no vitals filed for this visit.  Subjective Assessment - 04/20/17 1336    Subjective  Having hammertoe/ bunion surgery. NWB for 3 weeks then recovery 8-10 weeks. Unsure of surgery date.    Currently in Pain?  No/denies                      [facility] Adult PT Treatment/Exercise - 04/20/17 0001      Self-Care   Self-Care  Other Self-Care Comments    Other Self-Care Comments   Using HEP to maintain strength during non weight bearing period,  verbal review of HEP, pt to bring pictures next visit       Knee/Hip Exercises: Aerobic   Nustep  L5 LE only x 10 minutes       Knee/Hip Exercises: Standing   SLS  10 sec best     Other Standing Knee Exercises  alternating step taps, unilateral step taps     Other Standing Knee Exercises  45- 60 sec tandem trials       Knee/Hip Exercises: Seated   Long Arc Quad  Right;Left;20 reps;Weights;Limitations red band     Sit to [employer]  without UE support;10 reps      Knee/Hip Exercises: Supine   Bridges  10 reps    Straight Leg Raises  Left;Right;1 set;10 reps    Other Supine Knee/Hip Exercises  bent knee raises x 10 each                PT Short  Term Goals - 04/08/17 1429      PT SHORT TERM GOAL #1   Title  independent with initial HEP    Status  Achieved      PT SHORT TERM GOAL #2   Title  Report pain decrease at rest from 6/10 to 3/10    Status  Achieved      PT SHORT TERM GOAL #3   Title  Demonstrate and verbalize understanding of condition management including RICE, positioning, use of AD, HEP    Status  Achieved      PT SHORT TERM GOAL #4   Title  "Demonstrate understanding of proper sitting posture and be more conscious of position and posture throughout the day.     Status  Achieved        PT Long Term Goals - 04/08/17 1430      PT LONG TERM GOAL #1   Title  "Pt will be independent with advanced HEP    Status  On-going      PT LONG TERM GOAL #2   Title  "FOTO will improve from 66% limitation  to  54%  limited or less indicating improved functional mobility    Status  Unable to assess      PT LONG TERM GOAL #3   Title  BERG score witll improve to 50/56 to decr risk of fall    Baseline  49/56  today    Status  On-going      PT LONG TERM GOAL #4   Title  Pt will increase R hip and knee AROM in order to don socks with pain level 2/10 or less    Baseline  able to don sock independnetly    Status  Achieved      PT LONG TERM GOAL #5   Title  Pt will demonstrate sit to stand form chair without using compensatory motions(blocking knee, momentum) x 5    Baseline  needs ue assist    Status  On-going            Plan - 04/20/17 1413    Clinical Impression Statement  She is unable to rise from mat without UE. SLS and tandem stance improve. Reprinted HEP as she does not recall some of her exercises. She will bring them all for review next visit.     PT Next Visit Plan  Check BERG and goals, review and discharge.     PT Home Exercise Plan  SLR ,  bent knee raise, bridge, stand heel toe raise. sit-stand (elevated seat), standing hip abduction, SLS and tandem trials at counter , LAQ with red band     Consulted and Agree with Plan of Care  Patient       Patient will benefit from skilled therapeutic intervention in order to improve the following deficits and impairments:  Pain, Obesity, Difficulty walking, Increased edema, Decreased balance, Decreased activity tolerance, Decreased range of motion, Decreased strength, Decreased endurance, Postural dysfunction  Visit Diagnosis: Muscle weakness (generalized)  Chronic knee pain, unspecified laterality  Stiffness of right knee, not elsewhere classified  Stiffness of left knee, not elsewhere classified  Difficulty in walking, not elsewhere classified  Unsteadiness on feet     Problem List Patient Active Problem List   Diagnosis Date Noted  . Goiter 04/15/2017  . Urinary incontinence 04/15/2017  . Type 2 diabetes mellitus without complication, without long-term current use of insulin ([place]) 02/15/2017  . Bilateral leg cramps 01/05/2017  . High serum vitamin B12  01/05/2017  . Class 3 severe obesity with serious comorbidity and  body mass index (BMI) of 40.0 to 44.9 in adult ([place]) 01/05/2017  . Peripheral neuropathy 05/27/2016  . Screen for colon cancer 04/02/2016  . Elevated sed rate 03/05/2016  . RLS (restless legs syndrome) 02/12/2016  . Muscle spasm of both lower legs 02/12/2016  . Edema of both ankles 02/12/2016  . Iron deficiency anemia 02/12/2016  . Hypokalemia 02/12/2016  . Pernicious anemia 02/12/2016  . Degenerative disc disease, lumbar 04/09/2015  . Degenerative arthritis of knee, bilateral 03/13/2015  . Chronic low back pain 03/13/2015  . Diabetes ([place]) 02/15/2015  . Loss of weight 08/01/2014  . Coronary atherosclerosis of native coronary artery 10/31/2013  . Incidental lung nodule, > 23m and < 819m Left lower lobe 57m54m June 2014, April 2015 unchanged 06/25/2013  . OSA on CPAP 06/21/2013  . 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 Myalgia and myositis 05/02/2013  . Muscle ache 04/03/2013  . Nocturnal hypoxemia 02/17/2013  . Morbid obesity ([place]/25/2014  . Hiatal hernia seen on CT 08/24/2012  .  LLL nodule 6mm22mJune 2014, described in 2003 07/13/2012  . Asthma, chronic 06/29/2012  . Arthralgia 06/02/2012  . Unspecified asthma(493.90) 06/02/2012  . Hypersomnia with sleep apnea, unspecified 03/11/2012  . Other alteration of consciousness 03/11/2012  . Migraine without aura 03/11/2012  . Encounter for long-term (current) use of other medications 12/28/2011  . Dyspnea 12/28/2011  . Screening examination for infectious disease 12/28/2011  . Hx of fibroids 08/03/2011  . Knee pain, left 12/25/2010  . NECK PAIN 12/03/2009  . EDEMA 12/18/2008  . TINNITUS 11/22/2007  . HEARING LOSS 11/22/2007  . KNEE PAIN, RIGHT 11/22/2007  . ASYMPTOMATIC POSTMENOPAUSAL STATUS 11/22/2007  . GOITER, MULTINODULAR 07/20/2007  . GERD 07/20/2007  . NASH (nonalcoholic steatohepatitis) 07/20/2007  . Headache 07/20/2007  . HYPERCHOLESTEROLEMIA 01/13/2007  . ANXIETY 10/22/2006  . Essential hypertension 10/22/2006  .  ALLERGIC RHINITIS 10/22/2006  . Osteoarthritis 10/22/2006    DonoDorene ArA 04/20/2017, 2:47 PM  [facility], [place]ne: [phone]:  [phone]: GretRAY GLACKEN: [phone] of Birth: 09/2301-Jan-1947

## 2017-04-20 NOTE — Patient Instructions (Signed)
Knee Extension: Resisted (Sitting)   With band looped around right ankle and under other foot, straighten leg with ankle loop. Keep other leg bent to increase resistance. Repeat __10-20__ times per set. Do _2___ sets per session. Do _2___ sessions per day.  [email or web address]

## 2017-04-21 ENCOUNTER — Ambulatory Visit (INDEPENDENT_AMBULATORY_CARE_PROVIDER_SITE_OTHER): Payer: Medicare Other | Admitting: Family Medicine

## 2017-04-21 VITALS — BP 106/67 | HR 78 | Temp 97.6°F | Ht 64.0 in | Wt 230.0 lb

## 2017-04-21 DIAGNOSIS — E66812 Obesity, class 2: Secondary | ICD-10-CM

## 2017-04-21 DIAGNOSIS — Z6839 Body mass index (BMI) 39.0-39.9, adult: Secondary | ICD-10-CM

## 2017-04-21 DIAGNOSIS — E7849 Other hyperlipidemia: Secondary | ICD-10-CM | POA: Insufficient documentation

## 2017-04-21 NOTE — Progress Notes (Signed)
Office: [phone]  /  Fax: [phone]   HPI:   Chief Complaint: OBESITY Heather [name] is here to discuss her progress with her obesity treatment plan. She is on the Category 2 plan with breakfast options, and is following her eating plan approximately 80 % of the time. She states she is doing physical therapy 45 minutes 2 times per week. Heather [name] has done well maintaining weight, even with traveling, increased eating out and some celebration eating over valentine's day. She is getting ready to have foot surgery, and will be unable to exercise and is worried about boredom eating. Her weight is 230 lb (104.3 kg) today and has maintained weight over a period of 2 weeks since her last visit. She has lost 38 lbs since starting treatment with [name].  Hyperlipidemia Heather [name] has hyperlipidemia and is on Crestor. She would like to try to discontinue Lipitor and control her cholesterol levels with intensive lifestyle modification including a low saturated fat diet, exercise and weight loss. She denies any chest pain or myalgias.   ALLERGIES: Allergies  Allergen Reactions  . Aspirin   . Coconut Oil   . Latex   . Lisinopril     REACTION: cough  . Metoprolol Itching  . Peanut-Containing Drug Products   . Penicillins     REACTION: rash  . Prednisone   . Strawberry Extract     MEDICATIONS: Current Outpatient Medications on File Prior to Visit  Medication Sig Dispense Refill  . acetaminophen (TYLENOL) 500 MG tablet Take 650 mg by mouth 3 (three) times daily as needed for pain.     [name] ALLEGRA-D ALLERGY & CONGESTION 180-240 MG 24 hr tablet TAKE 1 TABLET BY MOUTH DAILY 30 tablet 0  . Calcium Carbonate-Vitamin D (CALCIUM 600 + D PO) Take 2 tablets by mouth daily.      [name] CALCIUM PO Take by mouth. Plus Vitamin D    . clopidogrel (PLAVIX) 75 MG tablet Take 1 tablet (75 mg total) by mouth daily. 90 tablet 3  . Ferrous Sulfate (IRON) 325 (65 FE) MG TABS Take 1 tablet by mouth daily.      . furosemide (LASIX)  40 MG tablet TAKE 1 TABLET EVERY DAY 90 tablet 3  . gabapentin (NEURONTIN) 100 MG capsule TAKE 2 CAPSULES(200 MG) BY MOUTH AT BEDTIME 180 capsule 1  . glucosamine-chondroitin 500-400 MG tablet Take 1 tablet by mouth 2 (two) times daily.      . metFORMIN (GLUCOPHAGE-XR) 500 MG 24 hr tablet TAKE 2 TABLETS TWICE DAILY 360 tablet 3  . modafinil (PROVIGIL) 200 MG tablet Take 1 tablet (200 mg total) by mouth daily. 30 tablet 1  . Multiple Vitamin (MULTIVITAMIN) tablet Take 1 tablet by mouth daily.      . naproxen sodium (ANAPROX) 220 MG tablet Take 220 mg by mouth as needed.    . Omega-3 Fatty Acids (FISH OIL) 1000 MG CAPS Take 1 capsule by mouth daily.    [name] omeprazole (PRILOSEC) 20 MG capsule Take 20 mg by mouth daily as needed.     . potassium chloride SA (K-DUR,KLOR-CON) 20 MEQ tablet Take 1 tablet (20 mEq total) 2 (two) times daily by mouth. 60 tablet 0  . Probiotic Product (PROBIOTIC-10) CAPS Take 1 capsule by mouth daily.    . pseudoephedrine-acetaminophen (TYLENOL SINUS) 30-500 MG TABS tablet Take 1 tablet by mouth as needed.    . pyridOXINE (VITAMIN B-6) 100 MG tablet Take 200 mg by mouth daily.    . rosuvastatin (CRESTOR) 40  MG tablet TAKE 1 TABLET EVERY DAY 90 tablet 3  . SF 5000 PLUS 1.1 % CREA dental cream   0  . SUMAtriptan (IMITREX) 20 MG/ACT nasal spray One spray (20 mg) in the nostril at the onset of migraine.May repeat in 2 hours if needed. No more than 2 sprays in a 24 hour period 12 Inhaler 1  . topiramate (TOPAMAX) 100 MG tablet TAKE 1 TABLET EVERY DAY 90 tablet 3  . Turmeric 500 MG TABS Take 2 tablets by mouth daily.    [name] venlafaxine XR (EFFEXOR-XR) 37.5 MG 24 hr capsule Take 1 capsule (37.5 mg total) by mouth daily with breakfast. 90 capsule 1  . vitamin E 200 UNIT capsule Take 200 Units by mouth daily.     Current Facility-Administered Medications on File Prior to Visit  Medication Dose Route Frequency Provider Last Rate Last Dose  . 0.9 %  sodium chloride infusion  500 mL  Intravenous Continuous Nandigam, Venia Minks, MD        PAST MEDICAL HISTORY: Past Medical History:  Diagnosis Date  . Abnormal Pap smear   . ALLERGIC RHINITIS 10/22/2006  . ANEMIA 12/18/2008  . Arthritis   . ASYMPTOMATIC POSTMENOPAUSAL STATUS 11/22/2007  . Back pain   . Complication of anesthesia   . Degenerative arthritis   . DIABETES MELLITUS, TYPE II 01/13/2007  . Dyslipidemia   . Fibroid   . GERD 07/20/2007  . GOITER, MULTINODULAR 07/20/2007  . Headache(784.0) 07/20/2007  . HEARING LOSS 11/22/2007  . Hiatal hernia   . Hx of colposcopy with cervical biopsy   . HYPERCHOLESTEROLEMIA 01/13/2007  . Hyperglycemia   . HYPERTENSION 10/22/2006  . NASH (nonalcoholic steatohepatitis)   . Nocturnal hypoxemia 02/17/2013  . Obesity   . Obstructive sleep apnea   . OSTEOARTHRITIS 10/22/2006  . Other chronic nonalcoholic liver disease 9/32/6712  . Sleep apnea     PAST SURGICAL HISTORY: Past Surgical History:  Procedure Laterality Date  . BREAST SURGERY     Breast reduction  . CATARACT EXTRACTION, BILATERAL    . DEXA  08/2005  . DILATION AND CURETTAGE OF UTERUS    . ELECTROCARDIOGRAM  10/15/2006  . ESOPHAGOGASTRODUODENOSCOPY  12/08/2005  . Stress Cardiolite  10/21/2005  . sweat gland removal    . WISDOM TOOTH EXTRACTION      SOCIAL HISTORY: Social History   Tobacco Use  . Smoking status: Former Smoker    Packs/day: 2.00    Years: 20.00    Pack years: 40.00    Types: Cigarettes    Last attempt to quit: 03/03/1979    Years since quitting: 38.1  . Smokeless tobacco: Never Used  Substance Use Topics  . Alcohol use: Yes    Comment: rare  . Drug use: No    FAMILY HISTORY: Family History  Problem Relation Age of Onset  . Asthma Mother   . Depression Mother   . Bipolar disorder Mother   . Dementia Mother   . Arthritis Mother   . Breast cancer Mother        primary  . Colon cancer Mother        mets from breast  . Cancer Mother   . Hypertension Father   . Migraines  Father   . Cancer Maternal Aunt   . Heart disease Maternal Grandmother   . Cancer Maternal Grandfather     ROS: Review of Systems  Constitutional: Negative for weight loss.  Cardiovascular: Negative for chest pain.  Musculoskeletal: Negative for myalgias.  PHYSICAL EXAM: Blood pressure 106/67, pulse 78, temperature 97.6 F (36.4 C), temperature source Oral, height 5' 4"  (1.626 m), weight 230 lb (104.3 kg), SpO2 97 %. Body mass index is 39.48 kg/m. Physical Exam  Constitutional: She is oriented to person, place, and time. She appears well-developed and well-nourished.  Cardiovascular: Normal rate.  Pulmonary/Chest: Effort normal.  Musculoskeletal: Normal range of motion.  Neurological: She is oriented to person, place, and time.  Skin: Skin is warm and dry.  Psychiatric: She has a normal mood and affect. Her behavior is normal.  Vitals reviewed.   RECENT LABS AND TESTS: BMET    Component Value Date/Time   NA 142 12/23/2016 0956   K 3.6 12/23/2016 0956   CL 102 12/23/2016 0956   CO2 30 (H) 12/23/2016 0956   GLUCOSE 89 12/23/2016 0956   GLUCOSE 116 (H) 03/05/2016 0958   BUN 13 12/23/2016 0956   CREATININE 0.74 12/23/2016 0956   CREATININE 0.81 12/28/2011 0853   CALCIUM 9.3 12/23/2016 0956   GFRNONAA 82 12/23/2016 0956   GFRAA 95 12/23/2016 0956   Lab Results  Component Value Date   HGBA1C 5.9 03/15/2017   HGBA1C 6.3 (H) 12/23/2016   HGBA1C 6.3 09/08/2016   HGBA1C 6.4 03/05/2016   HGBA1C 5.9 08/14/2015   Lab Results  Component Value Date   INSULIN 20.9 12/23/2016   CBC    Component Value Date/Time   WBC 4.9 12/23/2016 0956   WBC 7.2 03/05/2016 0958   RBC 4.32 12/23/2016 0956   RBC 4.46 03/05/2016 0958   HGB 12.1 12/23/2016 0956   HCT 37.4 12/23/2016 0956   PLT 300.0 03/05/2016 0958   MCV 87 12/23/2016 0956   MCH 28.0 12/23/2016 0956   MCH 29.4 12/28/2011 0853   MCHC 32.4 12/23/2016 0956   MCHC 32.9 03/05/2016 0958   RDW 17.5 (H) 12/23/2016 0956     LYMPHSABS 1.2 12/23/2016 0956   MONOABS 0.4 03/05/2016 0958   EOSABS 0.1 12/23/2016 0956   BASOSABS 0.0 12/23/2016 0956   Iron/TIBC/Ferritin/ %Sat    Component Value Date/Time   IRON 38 12/23/2016 0956   TIBC 271 12/23/2016 0956   FERRITIN 40 12/23/2016 0956   IRONPCTSAT 14 (L) 12/23/2016 0956   Lipid Panel     Component Value Date/Time   CHOL 178 12/23/2016 0956   TRIG 80 12/23/2016 0956   HDL 81 12/23/2016 0956   CHOLHDL 3 03/05/2016 0958   VLDL 26.0 03/05/2016 0958   LDLCALC 81 12/23/2016 0956   Hepatic Function Panel     Component Value Date/Time   PROT 7.4 12/23/2016 0956   ALBUMIN 4.1 12/23/2016 0956   AST 17 12/23/2016 0956   ALT 13 12/23/2016 0956   ALKPHOS 71 12/23/2016 0956   BILITOT <0.2 12/23/2016 0956   BILIDIR 0.1 02/15/2015 1010   IBILI 0.2 12/28/2011 0853      Component Value Date/Time   TSH 1.170 12/23/2016 0956   TSH 1.76 03/05/2016 0958   TSH 1.86 02/15/2015 1010    ASSESSMENT AND PLAN: Other hyperlipidemia  Class 2 severe obesity with serious comorbidity and body mass index (BMI) of 39.0 to 39.9 in adult, unspecified obesity type (HCC)  PLAN:  Hyperlipidemia Heather [name] was informed of the American Heart Association Guidelines emphasizing intensive lifestyle modifications as the first line treatment for hyperlipidemia. We discussed many lifestyle modifications today in depth, and Heather [name] will continue to work on decreasing saturated fats such as fatty red meat, butter and many fried foods. She will also  increase vegetables and lean protein in her diet and continue to work on exercise and weight loss efforts. We will recheck labs in 2 weeks.  We spent > than 50% of the 15 minute visit on the counseling as documented in the note.  Obesity Heather [name] is currently in the action stage of change. As such, her goal is to continue with weight loss efforts She has agreed to follow the Category 2 plan Heather [name] has been instructed to work up to a goal of 150  minutes of combined cardio and strengthening exercise per week for weight loss and overall health benefits. We discussed the following Behavioral Modification Strategies today: post op boredom eating strategies  Heather [name] has agreed to follow up with our clinic in 2 weeks. She was informed of the importance of frequent follow up visits to maximize her success with intensive lifestyle modifications for her multiple health conditions.   OBESITY BEHAVIORAL INTERVENTION VISIT  Today's visit was # 8 out of 22.  Starting weight: 268 lbs Starting date: 12/23/16 Today's weight : 230 lbs Today's date: 04/21/2017 Total lbs lost to date: 24 (Patients must lose 7 lbs in the first 6 months to continue with counseling)   ASK: We discussed the diagnosis of obesity with Heather [name] today and Heather [name] agreed to give [name] permission to discuss obesity behavioral modification therapy today.  ASSESS: Heather [name] has the diagnosis of obesity and her BMI today is 39.46 Heather [name] is in the action stage of change   ADVISE: Heather [name] was educated on the multiple health risks of obesity as well as the benefit of weight loss to improve her health. She was advised of the need for long term treatment and the importance of lifestyle modifications.  AGREE: Multiple dietary modification options and treatment options were discussed and  Heather [name] agreed to the above obesity treatment plan.  I, Doreene Nest, am acting as transcriptionist for [name], MD  I have reviewed the above documentation for accuracy and completeness, and I agree with the above. -[name], MD

## 2017-04-22 ENCOUNTER — Ambulatory Visit: Payer: Medicare Other

## 2017-04-22 ENCOUNTER — Telehealth: Payer: Self-pay | Admitting: *Deleted

## 2017-04-22 DIAGNOSIS — R262 Difficulty in walking, not elsewhere classified: Secondary | ICD-10-CM | POA: Diagnosis not present

## 2017-04-22 DIAGNOSIS — M6281 Muscle weakness (generalized): Secondary | ICD-10-CM

## 2017-04-22 DIAGNOSIS — R2681 Unsteadiness on feet: Secondary | ICD-10-CM

## 2017-04-22 DIAGNOSIS — M25662 Stiffness of left knee, not elsewhere classified: Secondary | ICD-10-CM | POA: Diagnosis not present

## 2017-04-22 DIAGNOSIS — G8929 Other chronic pain: Secondary | ICD-10-CM | POA: Diagnosis not present

## 2017-04-22 DIAGNOSIS — M25569 Pain in unspecified knee: Secondary | ICD-10-CM | POA: Diagnosis not present

## 2017-04-22 DIAGNOSIS — M25661 Stiffness of right knee, not elsewhere classified: Secondary | ICD-10-CM | POA: Diagnosis not present

## 2017-04-22 NOTE — Telephone Encounter (Signed)
I'm calling you in regards to your surgery we have scheduled for March 15.  "I haven't scheduled surgery yet."  Okay, can we schedule you for April 5th?  "Yes, that should be fine."  We also need medical clearance from your Cardiologist.  What's your Cardiologist's name?  "It's [name]."  Thank you so much.

## 2017-04-22 NOTE — Therapy (Signed)
[place], [place], [address] Phone: [phone]   Fax:  [phone]  Physical Therapy Treatment/Discharge  Patient Details  Name: Heather [name] MRN: [id number] Date of Birth: 11/02/1945 Referring Provider: Hulan Saas DO   Encounter Date: 04/22/2017  PT End of Session - 04/22/17 1142    Visit Number  10    Number of Visits  12    Date for PT Re-Evaluation  04/23/17    Authorization Type  MCR    PT Start Time  1143    PT Stop Time  1230    PT Time Calculation (min)  47 min    Activity Tolerance  Patient tolerated treatment well;No increased pain    Behavior During Therapy  WFL for tasks assessed/performed       Past Medical History:  Diagnosis Date  . Abnormal Pap smear   . ALLERGIC RHINITIS 10/22/2006  . ANEMIA 12/18/2008  . Arthritis   . ASYMPTOMATIC POSTMENOPAUSAL STATUS 11/22/2007  . Back pain   . Complication of anesthesia   . Degenerative arthritis   . DIABETES MELLITUS, TYPE II 01/13/2007  . Dyslipidemia   . Fibroid   . GERD 07/20/2007  . GOITER, MULTINODULAR 07/20/2007  . Headache(784.0) 07/20/2007  . HEARING LOSS 11/22/2007  . Hiatal hernia   . Hx of colposcopy with cervical biopsy   . HYPERCHOLESTEROLEMIA 01/13/2007  . Hyperglycemia   . HYPERTENSION 10/22/2006  . NASH (nonalcoholic steatohepatitis)   . Nocturnal hypoxemia 02/17/2013  . Obesity   . Obstructive sleep apnea   . OSTEOARTHRITIS 10/22/2006  . Other chronic nonalcoholic liver disease 9/32/6712  . Sleep apnea     Past Surgical History:  Procedure Laterality Date  . BREAST SURGERY     Breast reduction  . CATARACT EXTRACTION, BILATERAL    . DEXA  08/2005  . DILATION AND CURETTAGE OF UTERUS    . ELECTROCARDIOGRAM  10/15/2006  . ESOPHAGOGASTRODUODENOSCOPY  12/08/2005  . Stress Cardiolite  10/21/2005  . sweat gland removal    . WISDOM TOOTH EXTRACTION      There were no vitals filed for this visit.  Subjective  Assessment - 04/22/17 1144    Subjective  Knees sore today. Too many sit to stand exercises. Ready for discharge  today    Currently in Pain?  Yes    Pain Score  2     Pain Location  Knee    Pain Orientation  Right;Left    Pain Descriptors / Indicators  Sore    Pain Type  Chronic pain    Pain Onset  More than a month ago    Pain Frequency  Intermittent    Aggravating Factors   movement, sit to stand    Pain Relieving Factors  rest heat , cream         [facility] PT Assessment - 04/22/17 0001      Berg Balance Test   Sit to Stand  Able to stand  independently using hands    Standing Unsupported  Able to stand safely 2 minutes    Sitting with Back Unsupported but Feet Supported on Floor or Stool  Able to sit safely and securely 2 minutes    Stand to Sit  Controls descent by using hands    Transfers  Able to transfer safely, definite need of hands    Standing Unsupported with Eyes Closed  Able to stand 10 seconds safely    Standing Ubsupported with Feet Together  Able to place feet together independently and stand 1 minute safely    From Standing, Reach Forward with Outstretched Arm  Can reach confidently >25 cm (10")    From Standing Position, Pick up Object from [place] to pick up shoe safely and easily    From Standing Position, Turn to Look Behind Over each Shoulder  Looks behind from both sides and weight shifts well    Turn 360 Degrees  Able to turn 360 degrees safely one side only in 4 seconds or less    Standing Unsupported, Alternately Place Feet on Step/Stool  Able to stand independently and complete 8 steps >20 seconds    Standing Unsupported, One Foot in Front  Able to plae foot ahead of the other independently and hold 30 seconds    Standing on One Leg  Able to lift leg independently and hold 5-10 seconds    Total Score  49                            PT Short Term Goals - 04/08/17 1429      PT SHORT TERM GOAL #1   Title  independent with initial  HEP    Status  Achieved      PT SHORT TERM GOAL #2   Title  Report pain decrease at rest from 6/10 to 3/10    Status  Achieved      PT SHORT TERM GOAL #3   Title  Demonstrate and verbalize understanding of condition management including RICE, positioning, use of AD, HEP    Status  Achieved      PT SHORT TERM GOAL #4   Title  "Demonstrate understanding of proper sitting posture and be more conscious of position and posture throughout the day.     Status  Achieved        PT Long Term Goals - 04/08/17 1430      PT LONG TERM GOAL #1   Title  "Pt will be independent with advanced HEP    Status  On-going      PT LONG TERM GOAL #2   Title  "FOTO will improve from 66% limitation  to  54%  limited or less indicating improved functional mobility    Status  Unable to assess      PT LONG TERM GOAL #3   Title  BERG score witll improve to 50/56 to decr risk of fall    Baseline  49/56 today    Status  On-going      PT LONG TERM GOAL #4   Title  Pt will increase R hip and knee AROM in order to don socks with pain level 2/10 or less    Baseline  able to don sock independnetly    Status  Achieved      PT LONG TERM GOAL #5   Title  Pt will demonstrate sit to stand form chair without using compensatory motions(blocking knee, momentum) x 5    Baseline  needs ue assist    Status  On-going            Plan - 04/22/17 1142    Clinical Impression Statement  Continues with general LE functional weakness . to have foot surgery soon do ready for discharge. No change.     PT Treatment/Interventions  Cryotherapy;Moist Heat;Iontophoresis 58m/ml Dexamethasone;Ultrasound;Therapeutic exercise;Manual techniques;Patient/family education;Functional mobility training    PT Next Visit Plan  Discharge with HEP  PT Home Exercise Plan  SLR ,  bent knee raise, bridge, stand heel toe raise. sit-stand (elevated seat), standing hip abduction, SLS and tandem trials at counter , LAQ with red band      Consulted and Agree with Plan of Care  Patient       Patient will benefit from skilled therapeutic intervention in order to improve the following deficits and impairments:  Pain, Obesity, Difficulty walking, Increased edema, Decreased balance, Decreased activity tolerance, Decreased range of motion, Decreased strength, Decreased endurance, Postural dysfunction  Visit Diagnosis: Muscle weakness (generalized)  Chronic knee pain, unspecified laterality  Stiffness of right knee, not elsewhere classified  Stiffness of left knee, not elsewhere classified  Difficulty in walking, not elsewhere classified  Unsteadiness on feet     Problem List Patient Active Problem List   Diagnosis Date Noted  . Other hyperlipidemia 04/21/2017  . Goiter 04/15/2017  . Urinary incontinence 04/15/2017  . Type 2 diabetes mellitus without complication, without long-term current use of insulin ([place]) 02/15/2017  . Bilateral leg cramps 01/05/2017  . High serum vitamin B12 01/05/2017  . Class 3 severe obesity with serious comorbidity and body mass index (BMI) of 40.0 to 44.9 in adult ([place]) 01/05/2017  . Peripheral neuropathy 05/27/2016  . Screen for colon cancer 04/02/2016  . Elevated sed rate 03/05/2016  . RLS (restless legs syndrome) 02/12/2016  . Muscle spasm of both lower legs 02/12/2016  . Edema of both ankles 02/12/2016  . Iron deficiency anemia 02/12/2016  . Hypokalemia 02/12/2016  . Pernicious anemia 02/12/2016  . Degenerative disc disease, lumbar 04/09/2015  . Degenerative arthritis of knee, bilateral 03/13/2015  . Chronic low back pain 03/13/2015  . Diabetes ([place]) 02/15/2015  . Loss of weight 08/01/2014  . Coronary atherosclerosis of native coronary artery 10/31/2013  . Incidental lung nodule, > 71m and < 856m Left lower lobe 40m44m June 2014, April 2015 unchanged 06/25/2013  . OSA on CPAP 06/21/2013  . 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 Myalgia and myositis 05/02/2013  . Muscle ache  04/03/2013  . Nocturnal hypoxemia 02/17/2013  . Morbid obesity ([facility]CCDolores1/25/2014  . Hiatal hernia seen on CT 08/24/2012  .  LLL nodule 6mm26mJune 2014, described in 2003 07/13/2012  . Asthma, chronic 06/29/2012  . Arthralgia 06/02/2012  . Unspecified asthma(493.90) 06/02/2012  . Hypersomnia with sleep apnea, unspecified 03/11/2012  . Other alteration of consciousness 03/11/2012  . Migraine without aura 03/11/2012  . Encounter for long-term (current) use of other medications 12/28/2011  . Dyspnea 12/28/2011  . Screening examination for infectious disease 12/28/2011  . Hx of fibroids 08/03/2011  . Knee pain, left 12/25/2010  . NECK PAIN 12/03/2009  . EDEMA 12/18/2008  . TINNITUS 11/22/2007  . HEARING LOSS 11/22/2007  . KNEE PAIN, RIGHT 11/22/2007  . ASYMPTOMATIC POSTMENOPAUSAL STATUS 11/22/2007  . GOITER, MULTINODULAR 07/20/2007  . GERD 07/20/2007  . NASH (nonalcoholic steatohepatitis) 07/20/2007  . Headache 07/20/2007  . HYPERCHOLESTEROLEMIA 01/13/2007  . ANXIETY 10/22/2006  . Essential hypertension 10/22/2006  . ALLERGIC RHINITIS 10/22/2006  . Osteoarthritis 10/22/2006    ChasDarrel Hoover 04/22/2017, 1:08 PM  [facility], [place]: [phone]:  [phone]: GretAKI BURDIN: [id number] of Birth: 09/2304/13/1947YSICAL THERAPY DISCHARGE SUMMARY  Visits from Start of Care: 10  Current functional level related to goals / functional outcomes: See above   Remaining deficits: See above  Education / Equipment: HEP Plan: Patient agrees to discharge.  Patient goals were partially met. Patient is being discharged due to a change in medical status.  ?????

## 2017-05-03 DIAGNOSIS — K439 Ventral hernia without obstruction or gangrene: Secondary | ICD-10-CM | POA: Diagnosis not present

## 2017-05-03 DIAGNOSIS — Z6839 Body mass index (BMI) 39.0-39.9, adult: Secondary | ICD-10-CM | POA: Diagnosis not present

## 2017-05-03 DIAGNOSIS — K449 Diaphragmatic hernia without obstruction or gangrene: Secondary | ICD-10-CM | POA: Diagnosis not present

## 2017-05-03 DIAGNOSIS — Z1231 Encounter for screening mammogram for malignant neoplasm of breast: Secondary | ICD-10-CM | POA: Diagnosis not present

## 2017-05-03 LAB — HM MAMMOGRAPHY

## 2017-05-05 ENCOUNTER — Other Ambulatory Visit: Payer: Self-pay | Admitting: Endocrinology

## 2017-05-06 ENCOUNTER — Ambulatory Visit (INDEPENDENT_AMBULATORY_CARE_PROVIDER_SITE_OTHER): Payer: Medicare Other | Admitting: Family Medicine

## 2017-05-06 VITALS — BP 103/66 | HR 56 | Temp 98.3°F | Ht 64.0 in | Wt 230.0 lb

## 2017-05-06 DIAGNOSIS — G4733 Obstructive sleep apnea (adult) (pediatric): Secondary | ICD-10-CM | POA: Diagnosis not present

## 2017-05-06 DIAGNOSIS — Z6839 Body mass index (BMI) 39.0-39.9, adult: Secondary | ICD-10-CM

## 2017-05-06 NOTE — Progress Notes (Signed)
Office: [phone]  /  Fax: [phone]   HPI:   Chief Complaint: OBESITY Heather [name] is here to discuss her progress with her obesity treatment plan. She is on the Category 2 plan and is following her eating plan approximately 80 % of the time. She states she is exercising 0 minutes 0 times per week. Heather [name] has done well with weight loss but has decreased her metabolic rate due to cutting her calories too low. She is ready to try something different.  Her weight is 230 lb (104.3 kg) today and has not lost weight since her last visit. She has lost 38 lbs since starting treatment with [name].  Sleep Apnea Heather [name] notes increased fatigue and decreased ability to lose weight even with low calories. She is wearing CPAP nightly.  ALLERGIES: Allergies  Allergen Reactions  . Aspirin   . Coconut Oil   . Latex   . Lisinopril     REACTION: cough  . Metoprolol Itching  . Peanut-Containing Drug Products   . Penicillins     REACTION: rash  . Prednisone   . Strawberry Extract     MEDICATIONS: Current Outpatient Medications on File Prior to Visit  Medication Sig Dispense Refill  . acetaminophen (TYLENOL) 500 MG tablet Take 650 mg by mouth 3 (three) times daily as needed for pain.     [name] ALLEGRA-D ALLERGY & CONGESTION 180-240 MG 24 hr tablet TAKE 1 TABLET BY MOUTH DAILY 30 tablet 0  . Calcium Carbonate-Vitamin D (CALCIUM 600 + D PO) Take 2 tablets by mouth daily.      [name] CALCIUM PO Take by mouth. Plus Vitamin D    . clopidogrel (PLAVIX) 75 MG tablet Take 1 tablet (75 mg total) by mouth daily. 90 tablet 3  . Ferrous Sulfate (IRON) 325 (65 FE) MG TABS Take 1 tablet by mouth daily.      . furosemide (LASIX) 40 MG tablet TAKE 1 TABLET EVERY DAY 90 tablet 3  . gabapentin (NEURONTIN) 100 MG capsule TAKE 2 CAPSULES(200 MG) BY MOUTH AT BEDTIME 180 capsule 1  . glucosamine-chondroitin 500-400 MG tablet Take 1 tablet by mouth 2 (two) times daily.      . metFORMIN (GLUCOPHAGE-XR) 500 MG 24 hr tablet TAKE 2  TABLETS TWICE DAILY 360 tablet 3  . modafinil (PROVIGIL) 200 MG tablet Take 1 tablet (200 mg total) by mouth daily. 30 tablet 1  . Multiple Vitamin (MULTIVITAMIN) tablet Take 1 tablet by mouth daily.      . naproxen sodium (ANAPROX) 220 MG tablet Take 220 mg by mouth as needed.    . Omega-3 Fatty Acids (FISH OIL) 1000 MG CAPS Take 1 capsule by mouth daily.    [name] omeprazole (PRILOSEC) 20 MG capsule Take 20 mg by mouth daily as needed.     . potassium chloride SA (K-DUR,KLOR-CON) 20 MEQ tablet Take 1 tablet (20 mEq total) 2 (two) times daily by mouth. 60 tablet 0  . Probiotic Product (PROBIOTIC-10) CAPS Take 1 capsule by mouth daily.    . pseudoephedrine-acetaminophen (TYLENOL SINUS) 30-500 MG TABS tablet Take 1 tablet by mouth as needed.    . pyridOXINE (VITAMIN B-6) 100 MG tablet Take 200 mg by mouth daily.    . rosuvastatin (CRESTOR) 40 MG tablet TAKE 1 TABLET EVERY DAY 90 tablet 3  . SF 5000 PLUS 1.1 % CREA dental cream   0  . SUMAtriptan (IMITREX) 20 MG/ACT nasal spray One spray (20 mg) in the nostril at the onset of migraine.May  repeat in 2 hours if needed. No more than 2 sprays in a 24 hour period 12 Inhaler 1  . topiramate (TOPAMAX) 100 MG tablet TAKE 1 TABLET EVERY DAY 90 tablet 3  . Turmeric 500 MG TABS Take 2 tablets by mouth daily.    [name] venlafaxine XR (EFFEXOR-XR) 37.5 MG 24 hr capsule Take 1 capsule (37.5 mg total) by mouth daily with breakfast. 90 capsule 1  . vitamin E 200 UNIT capsule Take 200 Units by mouth daily.     Current Facility-Administered Medications on File Prior to Visit  Medication Dose Route Frequency Provider Last Rate Last Dose  . 0.9 %  sodium chloride infusion  500 mL Intravenous Continuous Heather [name], Heather Minks, MD        PAST MEDICAL HISTORY: Past Medical History:  Diagnosis Date  . Abnormal Pap smear   . ALLERGIC RHINITIS 10/22/2006  . ANEMIA 12/18/2008  . Arthritis   . ASYMPTOMATIC POSTMENOPAUSAL STATUS 11/22/2007  . Back pain   . Complication of  anesthesia   . Degenerative arthritis   . DIABETES MELLITUS, TYPE II 01/13/2007  . Dyslipidemia   . Fibroid   . GERD 07/20/2007  . GOITER, MULTINODULAR 07/20/2007  . Headache(784.0) 07/20/2007  . HEARING LOSS 11/22/2007  . Hiatal hernia   . Hx of colposcopy with cervical biopsy   . HYPERCHOLESTEROLEMIA 01/13/2007  . Hyperglycemia   . HYPERTENSION 10/22/2006  . NASH (nonalcoholic steatohepatitis)   . Nocturnal hypoxemia 02/17/2013  . Obesity   . Obstructive sleep apnea   . OSTEOARTHRITIS 10/22/2006  . Other chronic nonalcoholic liver disease 8/78/6767  . Sleep apnea     PAST SURGICAL HISTORY: Past Surgical History:  Procedure Laterality Date  . BREAST SURGERY     Breast reduction  . CATARACT EXTRACTION, BILATERAL    . DEXA  08/2005  . DILATION AND CURETTAGE OF UTERUS    . ELECTROCARDIOGRAM  10/15/2006  . ESOPHAGOGASTRODUODENOSCOPY  12/08/2005  . Stress Cardiolite  10/21/2005  . sweat gland removal    . WISDOM TOOTH EXTRACTION      SOCIAL HISTORY: Social History   Tobacco Use  . Smoking status: Former Smoker    Packs/day: 2.00    Years: 20.00    Pack years: 40.00    Types: Cigarettes    Last attempt to quit: 03/03/1979    Years since quitting: 38.2  . Smokeless tobacco: Never Used  Substance Use Topics  . Alcohol use: Yes    Comment: rare  . Drug use: No    FAMILY HISTORY: Family History  Problem Relation Age of Onset  . Asthma Mother   . Depression Mother   . Bipolar disorder Mother   . Dementia Mother   . Arthritis Mother   . Breast cancer Mother        primary  . Colon cancer Mother        mets from breast  . Cancer Mother   . Hypertension Father   . Migraines Father   . Cancer Maternal Aunt   . Heart disease Maternal Grandmother   . Cancer Maternal Grandfather     ROS: Review of Systems  Constitutional: Positive for malaise/fatigue. Negative for weight loss.    PHYSICAL EXAM: Blood pressure 103/66, pulse (!) 56, temperature 98.3 F (36.8 C),  temperature source Oral, height 5' 4"  (1.626 m), weight 230 lb (104.3 kg), SpO2 97 %. Body mass index is 39.48 kg/m. Physical Exam  Constitutional: She is oriented to person, place, and time. She  appears well-developed and well-nourished.  Cardiovascular: Normal rate.  Pulmonary/Chest: Effort normal.  Musculoskeletal: Normal range of motion.  Neurological: She is oriented to person, place, and time.  Skin: Skin is warm and dry.  Psychiatric: She has a normal mood and affect. Her behavior is normal.  Vitals reviewed.   RECENT LABS AND TESTS: BMET    Component Value Date/Time   NA 142 12/23/2016 0956   K 3.6 12/23/2016 0956   CL 102 12/23/2016 0956   CO2 30 (H) 12/23/2016 0956   GLUCOSE 89 12/23/2016 0956   GLUCOSE 116 (H) 03/05/2016 0958   BUN 13 12/23/2016 0956   CREATININE 0.74 12/23/2016 0956   CREATININE 0.81 12/28/2011 0853   CALCIUM 9.3 12/23/2016 0956   GFRNONAA 82 12/23/2016 0956   GFRAA 95 12/23/2016 0956   Lab Results  Component Value Date   HGBA1C 5.9 03/15/2017   HGBA1C 6.3 (H) 12/23/2016   HGBA1C 6.3 09/08/2016   HGBA1C 6.4 03/05/2016   HGBA1C 5.9 08/14/2015   Lab Results  Component Value Date   INSULIN 20.9 12/23/2016   CBC    Component Value Date/Time   WBC 4.9 12/23/2016 0956   WBC 7.2 03/05/2016 0958   RBC 4.32 12/23/2016 0956   RBC 4.46 03/05/2016 0958   HGB 12.1 12/23/2016 0956   HCT 37.4 12/23/2016 0956   PLT 300.0 03/05/2016 0958   MCV 87 12/23/2016 0956   MCH 28.0 12/23/2016 0956   MCH 29.4 12/28/2011 0853   MCHC 32.4 12/23/2016 0956   MCHC 32.9 03/05/2016 0958   RDW 17.5 (H) 12/23/2016 0956   LYMPHSABS 1.2 12/23/2016 0956   MONOABS 0.4 03/05/2016 0958   EOSABS 0.1 12/23/2016 0956   BASOSABS 0.0 12/23/2016 0956   Iron/TIBC/Ferritin/ %Sat    Component Value Date/Time   IRON 38 12/23/2016 0956   TIBC 271 12/23/2016 0956   FERRITIN 40 12/23/2016 0956   IRONPCTSAT 14 (L) 12/23/2016 0956   Lipid Panel     Component Value  Date/Time   CHOL 178 12/23/2016 0956   TRIG 80 12/23/2016 0956   HDL 81 12/23/2016 0956   CHOLHDL 3 03/05/2016 0958   VLDL 26.0 03/05/2016 0958   LDLCALC 81 12/23/2016 0956   Hepatic Function Panel     Component Value Date/Time   PROT 7.4 12/23/2016 0956   ALBUMIN 4.1 12/23/2016 0956   AST 17 12/23/2016 0956   ALT 13 12/23/2016 0956   ALKPHOS 71 12/23/2016 0956   BILITOT <0.2 12/23/2016 0956   BILIDIR 0.1 02/15/2015 1010   IBILI 0.2 12/28/2011 0853      Component Value Date/Time   TSH 1.170 12/23/2016 0956   TSH 1.76 03/05/2016 0958   TSH 1.86 02/15/2015 1010    ASSESSMENT AND PLAN: Obstructive sleep apnea syndrome  Class 2 severe obesity with serious comorbidity and body mass index (BMI) of 39.0 to 39.9 in adult, unspecified obesity type ([place])  PLAN:  Sleep Apnea We will recheck IC and Heather [name] will continue with diet, weight loss, CPAP. Heather [name] agrees to follow up with our clinic in 2 weeks.  We spent > than 50% of the 30 minute visit on the counseling as documented in the note.  Obesity Heather [name] is currently in the action stage of change. As such, her goal is to continue with weight loss efforts She has agreed to change to follow a lower carbohydrate, vegetable and lean protein rich diet plan Heather [name] has been instructed to work up to a goal of 150 minutes of  combined cardio and strengthening exercise per week for weight loss and overall health benefits. We discussed the following Behavioral Modification Strategies today: increasing lean protein intake, no skipping meals, and work on meal planning and easy cooking plans   Heather [name] has agreed to follow up with our clinic in 2 weeks. She was informed of the importance of frequent follow up visits to maximize her success with intensive lifestyle modifications for her multiple health conditions.   OBESITY BEHAVIORAL INTERVENTION VISIT  Today's visit was # 9 out of 22.  Starting weight: 268 lbs Starting date: 12/23/16 Today's  weight : 230 lbs  Today's date: 05/06/2017 Total lbs lost to date: 107 (Patients must lose 7 lbs in the first 6 months to continue with counseling)   ASK: We discussed the diagnosis of obesity with Heather [name] today and Heather [name] agreed to give [name] permission to discuss obesity behavioral modification therapy today.  ASSESS: Heather [name] has the diagnosis of obesity and her BMI today is 39.46 Heather [name] is in the action stage of change   ADVISE: Heather [name] was educated on the multiple health risks of obesity as well as the benefit of weight loss to improve her health. She was advised of the need for long term treatment and the importance of lifestyle modifications.  AGREE: Multiple dietary modification options and treatment options were discussed and  Heather [name] agreed to the above obesity treatment plan.  I, [name], am acting as transcriptionist for [name], MD  I have reviewed the above documentation for accuracy and completeness, and I agree with the above. -[name], MD

## 2017-05-12 DIAGNOSIS — K439 Ventral hernia without obstruction or gangrene: Secondary | ICD-10-CM | POA: Diagnosis not present

## 2017-05-19 ENCOUNTER — Ambulatory Visit (INDEPENDENT_AMBULATORY_CARE_PROVIDER_SITE_OTHER): Payer: Medicare Other | Admitting: Adult Health

## 2017-05-19 ENCOUNTER — Encounter: Payer: Self-pay | Admitting: Neurology

## 2017-05-19 ENCOUNTER — Encounter: Payer: Self-pay | Admitting: Adult Health

## 2017-05-19 VITALS — BP 111/64 | HR 91 | Wt 227.6 lb

## 2017-05-19 DIAGNOSIS — Z9989 Dependence on other enabling machines and devices: Secondary | ICD-10-CM

## 2017-05-19 DIAGNOSIS — G4733 Obstructive sleep apnea (adult) (pediatric): Secondary | ICD-10-CM | POA: Diagnosis not present

## 2017-05-19 DIAGNOSIS — G43009 Migraine without aura, not intractable, without status migrainosus: Secondary | ICD-10-CM | POA: Diagnosis not present

## 2017-05-19 NOTE — Progress Notes (Signed)
PATIENT: Heather [name] DOB: 05-29-1945  REASON FOR VISIT: follow up HISTORY FROM: patient  HISTORY OF PRESENT ILLNESS: Today 05/19/17 Heather [name] is a 72 year old female with a history of migraine headaches and obstructive sleep apnea.  She returns today for follow-up.  She reports that she is doing really well.  She denies any headaches.  She continues on Topamax 100 mg at bedtime.  Reports that she has not had to use the Imitrex nasal spray.  Reports that she is also not having to use Provigil.  She states that she is compliant with CPAP.  She did forget to bring her machine today.  She feels that she continues to work well.  Reports that she has normal daytime sleepiness does not feel that this has gotten any worse.  She returns today for evaluation.  HISTORY Heather [name] is a 72 year old female with a history of migraine headaches and obstructive sleep apnea. She returns today for follow-up. The patient reports that her headaches have been controlled since she was placed on Topamax. She is currently taking Topamax 100 mg at bedtime. she reports that when she does get a headache the Imitrex nasal spray works well for her. She states that she rarely has to use this. She followed up with Dr. Brett Fairy in December for obstructive sleep apnea. She has excellent compliance and good treatment of her apnea. Overall the patient states that she's been doing well. She does note some muscle cramps in the lower extremities. She states that the cramps are better when her legs are swollen. She is on Lasix and potassium. She returns today for an evaluation.  REVIEW OF SYSTEMS: Out of a complete 14 system review of symptoms, the patient complains only of the following symptoms, and all other reviewed systems are negative.  ESS 13, FSS 32  See HPI  ALLERGIES: Allergies  Allergen Reactions  . Aspirin   . Coconut Oil   . Latex   . Lisinopril     REACTION: cough  . Metoprolol Itching  .  Peanut-Containing Drug Products   . Penicillins     REACTION: rash  . Prednisone   . Strawberry Extract     HOME MEDICATIONS: Outpatient Medications Prior to Visit  Medication Sig Dispense Refill  . acetaminophen (TYLENOL) 500 MG tablet Take 650 mg by mouth 3 (three) times daily as needed for pain.     [name] ALLEGRA-D ALLERGY & CONGESTION 180-240 MG 24 hr tablet TAKE 1 TABLET BY MOUTH DAILY 30 tablet 0  . Calcium Carbonate-Vitamin D (CALCIUM 600 + D PO) Take 2 tablets by mouth daily.      . clopidogrel (PLAVIX) 75 MG tablet Take 1 tablet (75 mg total) by mouth daily. 90 tablet 3  . Ferrous Sulfate (IRON) 325 (65 FE) MG TABS Take 1 tablet by mouth daily.      . furosemide (LASIX) 40 MG tablet TAKE 1 TABLET EVERY DAY 90 tablet 3  . gabapentin (NEURONTIN) 100 MG capsule TAKE 2 CAPSULES(200 MG) BY MOUTH AT BEDTIME 180 capsule 1  . glucosamine-chondroitin 500-400 MG tablet Take 1 tablet by mouth 2 (two) times daily.      . metFORMIN (GLUCOPHAGE-XR) 500 MG 24 hr tablet TAKE 2 TABLETS TWICE DAILY 360 tablet 3  . modafinil (PROVIGIL) 200 MG tablet Take 1 tablet (200 mg total) by mouth daily. 30 tablet 1  . Multiple Vitamin (MULTIVITAMIN) tablet Take 1 tablet by mouth daily.      . naproxen sodium (ANAPROX)  220 MG tablet Take 220 mg by mouth as needed.    . Omega-3 Fatty Acids (FISH OIL) 1000 MG CAPS Take 1 capsule by mouth daily.    [name] omeprazole (PRILOSEC) 20 MG capsule Take 20 mg by mouth daily as needed.     . potassium chloride SA (K-DUR,KLOR-CON) 20 MEQ tablet Take 1 tablet (20 mEq total) 2 (two) times daily by mouth. 60 tablet 0  . pseudoephedrine-acetaminophen (TYLENOL SINUS) 30-500 MG TABS tablet Take 1 tablet by mouth as needed.    . pyridOXINE (VITAMIN B-6) 100 MG tablet Take 200 mg by mouth daily.    . rosuvastatin (CRESTOR) 40 MG tablet TAKE 1 TABLET EVERY DAY 90 tablet 3  . SF 5000 PLUS 1.1 % CREA dental cream   0  . SUMAtriptan (IMITREX) 20 MG/ACT nasal spray One spray (20 mg) in the  nostril at the onset of migraine.May repeat in 2 hours if needed. No more than 2 sprays in a 24 hour period 12 Inhaler 1  . topiramate (TOPAMAX) 100 MG tablet TAKE 1 TABLET EVERY DAY 90 tablet 3  . Turmeric 500 MG TABS Take 2 tablets by mouth daily.    [name] venlafaxine XR (EFFEXOR-XR) 37.5 MG 24 hr capsule Take 1 capsule (37.5 mg total) by mouth daily with breakfast. 90 capsule 1  . vitamin E 200 UNIT capsule Take 200 Units by mouth daily.    . Probiotic Product (PROBIOTIC-10) CAPS Take 1 capsule by mouth daily.    [name] CALCIUM PO Take by mouth. Plus Vitamin D     Facility-Administered Medications Prior to Visit  Medication Dose Route Frequency Provider Last Rate Last Dose  . 0.9 %  sodium chloride infusion  500 mL Intravenous Continuous Nandigam, Venia Minks, MD        PAST MEDICAL HISTORY: Past Medical History:  Diagnosis Date  . Abnormal Pap smear   . ALLERGIC RHINITIS 10/22/2006  . ANEMIA 12/18/2008  . Arthritis   . ASYMPTOMATIC POSTMENOPAUSAL STATUS 11/22/2007  . Back pain   . Complication of anesthesia   . Degenerative arthritis   . DIABETES MELLITUS, TYPE II 01/13/2007  . Dyslipidemia   . Fibroid   . GERD 07/20/2007  . GOITER, MULTINODULAR 07/20/2007  . Headache(784.0) 07/20/2007  . HEARING LOSS 11/22/2007  . Hiatal hernia   . Hx of colposcopy with cervical biopsy   . HYPERCHOLESTEROLEMIA 01/13/2007  . Hyperglycemia   . HYPERTENSION 10/22/2006  . NASH (nonalcoholic steatohepatitis)   . Nocturnal hypoxemia 02/17/2013  . Obesity   . Obstructive sleep apnea   . OSTEOARTHRITIS 10/22/2006  . Other chronic nonalcoholic liver disease 3/66/4403  . Sleep apnea     PAST SURGICAL HISTORY: Past Surgical History:  Procedure Laterality Date  . BREAST SURGERY     Breast reduction  . CATARACT EXTRACTION, BILATERAL    . DEXA  08/2005  . DILATION AND CURETTAGE OF UTERUS    . ELECTROCARDIOGRAM  10/15/2006  . ESOPHAGOGASTRODUODENOSCOPY  12/08/2005  . Stress Cardiolite  10/21/2005  . sweat  gland removal    . WISDOM TOOTH EXTRACTION      FAMILY HISTORY: Family History  Problem Relation Age of Onset  . Asthma Mother   . Depression Mother   . Bipolar disorder Mother   . Dementia Mother   . Arthritis Mother   . Breast cancer Mother        primary  . Colon cancer Mother        mets from breast  .  Cancer Mother   . Hypertension Father   . Migraines Father   . Cancer Maternal Aunt   . Heart disease Maternal Grandmother   . Cancer Maternal Grandfather     SOCIAL HISTORY: Social History   Socioeconomic History  . Marital status: Married    Spouse name: [name]  . Number of children: 2  . Years of education: College  . Highest education level: Not on file  Social Needs  . Financial resource strain: Not on file  . Food insecurity - worry: Not on file  . Food insecurity - inability: Not on file  . Transportation needs - medical: Not on file  . Transportation needs - non-medical: Not on file  Occupational History  . Occupation: Retired  Tobacco Use  . Smoking status: Former Smoker    Packs/day: 2.00    Years: 20.00    Pack years: 40.00    Types: Cigarettes    Last attempt to quit: 03/03/1979    Years since quitting: 38.2  . Smokeless tobacco: Never Used  Substance and Sexual Activity  . Alcohol use: Yes    Comment: rare  . Drug use: No  . Sexual activity: Yes    Birth control/protection: Surgical  Other Topics Concern  . Not on file  Social History Narrative   Patient is married [name]).   Patient has two children.   Patient is retired.   Patient has a college education.   Patient is right-handed.   Patient lives at home with family.   Caffeine Use: 2 soda every other day      PHYSICAL EXAM  Vitals:   05/19/17 1036  BP: 111/64  Pulse: 91  Weight: 227 lb 9.6 oz (103.2 kg)   Body mass index is 39.07 kg/m.  Generalized: Well developed, in no acute distress   Neurological examination  Mentation: Alert oriented to time, place, history  taking. Follows all commands speech and language fluent Cranial nerve II-XII: Pupils were equal round reactive to light. Extraocular movements were full, visual field were full on confrontational test. Facial sensation and strength were normal. Uvula tongue midline. Head turning and shoulder shrug  were normal and symmetric. Motor: The motor testing reveals 5 over 5 strength of all 4 extremities. Good symmetric motor tone is noted throughout.  Sensory: Sensory testing is intact to soft touch on all 4 extremities. No evidence of extinction is noted.  Coordination: Cerebellar testing reveals good finger-nose-finger and heel-to-shin bilaterally.  Gait and station: Gait is normal.  Reflexes: Deep tendon reflexes are symmetric and normal bilaterally.   DIAGNOSTIC DATA (LABS, IMAGING, TESTING) - I reviewed patient records, labs, notes, testing and imaging myself where available.  Lab Results  Component Value Date   WBC 4.9 12/23/2016   HGB 12.1 12/23/2016   HCT 37.4 12/23/2016   MCV 87 12/23/2016   PLT 300.0 03/05/2016      Component Value Date/Time   NA 142 12/23/2016 0956   K 3.6 12/23/2016 0956   CL 102 12/23/2016 0956   CO2 30 (H) 12/23/2016 0956   GLUCOSE 89 12/23/2016 0956   GLUCOSE 116 (H) 03/05/2016 0958   BUN 13 12/23/2016 0956   CREATININE 0.74 12/23/2016 0956   CREATININE 0.81 12/28/2011 0853   CALCIUM 9.3 12/23/2016 0956   PROT 7.4 12/23/2016 0956   ALBUMIN 4.1 12/23/2016 0956   AST 17 12/23/2016 0956   ALT 13 12/23/2016 0956   ALKPHOS 71 12/23/2016 0956   BILITOT <0.2 12/23/2016 0956   GFRNONAA  82 12/23/2016 0956   GFRAA 95 12/23/2016 0956   Lab Results  Component Value Date   CHOL 178 12/23/2016   HDL 81 12/23/2016   LDLCALC 81 12/23/2016   TRIG 80 12/23/2016   CHOLHDL 3 03/05/2016   Lab Results  Component Value Date   HGBA1C 5.9 03/15/2017   Lab Results  Component Value Date   VITAMINB12 >2000 (H) 12/23/2016   Lab Results  Component Value Date   TSH  1.170 12/23/2016      ASSESSMENT AND PLAN 72 y.o. year old female  has a past medical history of Abnormal Pap smear, ALLERGIC RHINITIS (10/22/2006), ANEMIA (12/18/2008), Arthritis, ASYMPTOMATIC POSTMENOPAUSAL STATUS (11/22/2007), Back pain, Complication of anesthesia, Degenerative arthritis, DIABETES MELLITUS, TYPE II (01/13/2007), Dyslipidemia, Fibroid, GERD (07/20/2007), GOITER, MULTINODULAR (07/20/2007), Headache(784.0) (07/20/2007), HEARING LOSS (11/22/2007), Hiatal hernia, colposcopy with cervical biopsy, HYPERCHOLESTEROLEMIA (01/13/2007), Hyperglycemia, HYPERTENSION (10/22/2006), NASH (nonalcoholic steatohepatitis), Nocturnal hypoxemia (02/17/2013), Obesity, Obstructive sleep apnea, OSTEOARTHRITIS (10/22/2006), Other chronic nonalcoholic liver disease (2/92/9090), and Sleep apnea. here with:  1.  Obstructive sleep apnea on CPAP 2. Migraine headaches  The patient is doing well in regards to her headaches.  Denies any significant headaches and has not had to use Imitrex nasal spray.  She will continue on Topamax 100 mg at bedtime.  The patient is advised that she should bring her CPAP machine by the office for a download.  She voiced understanding.  She is advised that if her symptoms worsen or she develops new symptoms she should let us know.  She will follow-up in 1 year or sooner if needed.     Ward Givens, MSN, NP-C 05/19/2017, 10:51 AM [facility] [address] [place], [place] [address] [phone]

## 2017-05-19 NOTE — Patient Instructions (Signed)
Your Plan:  Continue Topamax 100 mg daily Bring CPAP machine by the office for a download If your symptoms worsen or you develop new symptoms please let us know.   Thank you for coming to see [name] at [facility]. I hope we have been able to provide you high quality care today.  You may receive a patient satisfaction survey over the next few weeks. We would appreciate your feedback and comments so that we may continue to improve ourselves and the health of our patients.

## 2017-05-19 NOTE — Progress Notes (Signed)
I agree fully with the medical plan as directed by NP .The patient is known to me .    Jesselee Poth, MD

## 2017-05-20 ENCOUNTER — Other Ambulatory Visit: Payer: Self-pay | Admitting: Cardiology

## 2017-05-20 ENCOUNTER — Other Ambulatory Visit: Payer: Self-pay | Admitting: Endocrinology

## 2017-05-20 ENCOUNTER — Telehealth: Payer: Self-pay | Admitting: Adult Health

## 2017-05-20 ENCOUNTER — Ambulatory Visit (INDEPENDENT_AMBULATORY_CARE_PROVIDER_SITE_OTHER): Payer: Medicare Other | Admitting: Family Medicine

## 2017-05-20 VITALS — BP 123/74 | HR 97 | Temp 97.8°F | Ht 64.0 in | Wt 223.0 lb

## 2017-05-20 DIAGNOSIS — E119 Type 2 diabetes mellitus without complications: Secondary | ICD-10-CM | POA: Diagnosis not present

## 2017-05-20 DIAGNOSIS — Z6838 Body mass index (BMI) 38.0-38.9, adult: Secondary | ICD-10-CM

## 2017-05-20 NOTE — Telephone Encounter (Signed)
LVM requesting patient call back for CPAP results.

## 2017-05-20 NOTE — Telephone Encounter (Signed)
The patient brought her machine for a download.  Her download indicates that she use her machine 30 out of 30 days for compliance of 100%.  She used her machine greater than 4 hours 18 out of 30 days for compliance of 60%.  On average she uses her machine 4 hours and 11 minutes.  Her residual AHI is 0.5 on an respiratory pressure of 11 cm of water and expiratory pressure of 7 cm of water.  She does have a leak in the 95th percentile at 30.7.  Please call patient and advised that her download shows good compliance and treatment of her apnea.  Please ensure that she is tightening her straps to her mask to ensure that the mask is not leaking.  Also advise patient that she should change out her supplies every 3 months.

## 2017-05-24 NOTE — Telephone Encounter (Signed)
Spoke with patient and advised that her download shows good compliance and treatment of her apnea.  Advised that she is tightening her straps to her mask to ensure that the mask is not leaking.  Also advised patient that she should change out her supplies every 3 months.   She verbalized understanding, appreciation.

## 2017-05-24 NOTE — Progress Notes (Signed)
Office: [phone]  /  Fax: [phone]   HPI:   Chief Complaint: OBESITY Heather [name] is here to discuss her progress with her obesity treatment plan. She is on the lower carbohydrate, vegetable and lean protein rich diet plan and is following her eating plan approximately 100 % of the time. She states she is doing elliptical cross trainer for 30 minutes 4 times per week. Heather [name] was changed to a lower carbohydrate plan and has done well with weight loss. She struggled to get started, but she did well once she started. She is doing well with meal planning and prepping. Heather [name] ate a lot of lean proteins and vegetables. She recently bought an elliptical cross trainer. Her weight is 223 lb (101.2 kg) today and has had a weight loss of 7 pounds over a period of 2 weeks since her last visit. She has lost 45 lbs since starting treatment with [name].  Diabetes II Heather [name] has a diagnosis of diabetes type II. There is no blood sugar log today and Heather [name] denies any hypoglycemic episodes, nausea or vomiting.Heather [name] is improved and last A1c was at 5.5 She has been working on intensive lifestyle modifications including diet, exercise, and weight loss to help control her blood glucose levels.  ALLERGIES: Allergies  Allergen Reactions  . Aspirin   . Coconut Oil   . Latex   . Lisinopril     REACTION: cough  . Metoprolol Itching  . Peanut-Containing Drug Products   . Penicillins     REACTION: rash  . Prednisone   . Strawberry Extract     MEDICATIONS: Current Outpatient Medications on File Prior to Visit  Medication Sig Dispense Refill  . acetaminophen (TYLENOL) 500 MG tablet Take 650 mg by mouth 3 (three) times daily as needed for pain.     [name] ALLEGRA-D ALLERGY & CONGESTION 180-240 MG 24 hr tablet TAKE 1 TABLET BY MOUTH DAILY 30 tablet 0  . Calcium Carbonate-Vitamin D (CALCIUM 600 + D PO) Take 2 tablets by mouth daily.      . clopidogrel (PLAVIX) 75 MG tablet Take 1 tablet (75 mg total) by mouth daily.  90 tablet 3  . Ferrous Sulfate (IRON) 325 (65 FE) MG TABS Take 1 tablet by mouth daily.      [name] gabapentin (NEURONTIN) 100 MG capsule TAKE 2 CAPSULES(200 MG) BY MOUTH AT BEDTIME 180 capsule 1  . glucosamine-chondroitin 500-400 MG tablet Take 1 tablet by mouth 2 (two) times daily.      . metFORMIN (GLUCOPHAGE-XR) 500 MG 24 hr tablet TAKE 2 TABLETS TWICE DAILY 360 tablet 3  . Multiple Vitamin (MULTIVITAMIN) tablet Take 1 tablet by mouth daily.      . naproxen sodium (ANAPROX) 220 MG tablet Take 220 mg by mouth as needed.    . Omega-3 Fatty Acids (FISH OIL) 1000 MG CAPS Take 1 capsule by mouth daily.    [name] omeprazole (PRILOSEC) 20 MG capsule Take 20 mg by mouth daily as needed.     . Probiotic Product (PROBIOTIC-10) CAPS Take 1 capsule by mouth daily.    . pseudoephedrine-acetaminophen (TYLENOL SINUS) 30-500 MG TABS tablet Take 1 tablet by mouth as needed.    . pyridOXINE (VITAMIN B-6) 100 MG tablet Take 200 mg by mouth daily.    . rosuvastatin (CRESTOR) 40 MG tablet TAKE 1 TABLET EVERY DAY 90 tablet 3  . SF 5000 PLUS 1.1 % CREA dental cream   0  . topiramate (TOPAMAX) 100 MG tablet TAKE 1 TABLET EVERY DAY  90 tablet 3  . Turmeric 500 MG TABS Take 2 tablets by mouth daily.    [name] venlafaxine XR (EFFEXOR-XR) 37.5 MG 24 hr capsule Take 1 capsule (37.5 mg total) by mouth daily with breakfast. 90 capsule 1  . vitamin E 200 UNIT capsule Take 200 Units by mouth daily.     Current Facility-Administered Medications on File Prior to Visit  Medication Dose Route Frequency Provider Last Rate Last Dose  . 0.9 %  sodium chloride infusion  500 mL Intravenous Continuous Nandigam, Venia Minks, MD        PAST MEDICAL HISTORY: Past Medical History:  Diagnosis Date  . Abnormal Pap smear   . ALLERGIC RHINITIS 10/22/2006  . ANEMIA 12/18/2008  . Arthritis   . ASYMPTOMATIC POSTMENOPAUSAL STATUS 11/22/2007  . Back pain   . Complication of anesthesia   . Degenerative arthritis   . DIABETES MELLITUS, TYPE II 01/13/2007   . Dyslipidemia   . Fibroid   . GERD 07/20/2007  . GOITER, MULTINODULAR 07/20/2007  . Headache(784.0) 07/20/2007  . HEARING LOSS 11/22/2007  . Hiatal hernia   . Hx of colposcopy with cervical biopsy   . HYPERCHOLESTEROLEMIA 01/13/2007  . Hyperglycemia   . HYPERTENSION 10/22/2006  . NASH (nonalcoholic steatohepatitis)   . Nocturnal hypoxemia 02/17/2013  . Obesity   . Obstructive sleep apnea   . OSTEOARTHRITIS 10/22/2006  . Other chronic nonalcoholic liver disease 8/56/3149  . Sleep apnea     PAST SURGICAL HISTORY: Past Surgical History:  Procedure Laterality Date  . BREAST SURGERY     Breast reduction  . CATARACT EXTRACTION, BILATERAL    . DEXA  08/2005  . DILATION AND CURETTAGE OF UTERUS    . ELECTROCARDIOGRAM  10/15/2006  . ESOPHAGOGASTRODUODENOSCOPY  12/08/2005  . Stress Cardiolite  10/21/2005  . sweat gland removal    . WISDOM TOOTH EXTRACTION      SOCIAL HISTORY: Social History   Tobacco Use  . Smoking status: Former Smoker    Packs/day: 2.00    Years: 20.00    Pack years: 40.00    Types: Cigarettes    Last attempt to quit: 03/03/1979    Years since quitting: 38.2  . Smokeless tobacco: Never Used  Substance Use Topics  . Alcohol use: Yes    Comment: rare  . Drug use: No    FAMILY HISTORY: Family History  Problem Relation Age of Onset  . Asthma Mother   . Depression Mother   . Bipolar disorder Mother   . Dementia Mother   . Arthritis Mother   . Breast cancer Mother        primary  . Colon cancer Mother        mets from breast  . Cancer Mother   . Hypertension Father   . Migraines Father   . Cancer Maternal Aunt   . Heart disease Maternal Grandmother   . Cancer Maternal Grandfather     ROS: Review of Systems  Constitutional: Positive for weight loss.  Gastrointestinal: Negative for nausea and vomiting.  Endo/Heme/Allergies:       Negative for hypoglycemia     PHYSICAL EXAM: Blood pressure 123/74, pulse 97, temperature 97.8 F (36.6 C),  temperature source Oral, height 5' 4"  (1.626 m), weight 223 lb (101.2 kg), SpO2 98 %. Body mass index is 38.28 kg/m. Physical Exam  Constitutional: She is oriented to person, place, and time. She appears well-developed and well-nourished.  Cardiovascular: Normal rate.  Pulmonary/Chest: Effort normal.  Musculoskeletal: Normal range  of motion.  Neurological: She is oriented to person, place, and time.  Skin: Skin is warm and dry.  Psychiatric: She has a normal mood and affect. Her behavior is normal.  Vitals reviewed.   RECENT LABS AND TESTS: BMET    Component Value Date/Time   NA 142 12/23/2016 0956   K 3.6 12/23/2016 0956   CL 102 12/23/2016 0956   CO2 30 (H) 12/23/2016 0956   GLUCOSE 89 12/23/2016 0956   GLUCOSE 116 (H) 03/05/2016 0958   BUN 13 12/23/2016 0956   CREATININE 0.74 12/23/2016 0956   CREATININE 0.81 12/28/2011 0853   CALCIUM 9.3 12/23/2016 0956   GFRNONAA 82 12/23/2016 0956   GFRAA 95 12/23/2016 0956   Lab Results  Component Value Date   HGBA1C 5.9 03/15/2017   HGBA1C 6.3 (H) 12/23/2016   HGBA1C 6.3 09/08/2016   HGBA1C 6.4 03/05/2016   HGBA1C 5.9 08/14/2015   Lab Results  Component Value Date   INSULIN 20.9 12/23/2016   CBC    Component Value Date/Time   WBC 4.9 12/23/2016 0956   WBC 7.2 03/05/2016 0958   RBC 4.32 12/23/2016 0956   RBC 4.46 03/05/2016 0958   HGB 12.1 12/23/2016 0956   HCT 37.4 12/23/2016 0956   PLT 300.0 03/05/2016 0958   MCV 87 12/23/2016 0956   MCH 28.0 12/23/2016 0956   MCH 29.4 12/28/2011 0853   MCHC 32.4 12/23/2016 0956   MCHC 32.9 03/05/2016 0958   RDW 17.5 (H) 12/23/2016 0956   LYMPHSABS 1.2 12/23/2016 0956   MONOABS 0.4 03/05/2016 0958   EOSABS 0.1 12/23/2016 0956   BASOSABS 0.0 12/23/2016 0956   Iron/TIBC/Ferritin/ %Sat    Component Value Date/Time   IRON 38 12/23/2016 0956   TIBC 271 12/23/2016 0956   FERRITIN 40 12/23/2016 0956   IRONPCTSAT 14 (L) 12/23/2016 0956   Lipid Panel     Component Value  Date/Time   CHOL 178 12/23/2016 0956   TRIG 80 12/23/2016 0956   HDL 81 12/23/2016 0956   CHOLHDL 3 03/05/2016 0958   VLDL 26.0 03/05/2016 0958   LDLCALC 81 12/23/2016 0956   Hepatic Function Panel     Component Value Date/Time   PROT 7.4 12/23/2016 0956   ALBUMIN 4.1 12/23/2016 0956   AST 17 12/23/2016 0956   ALT 13 12/23/2016 0956   ALKPHOS 71 12/23/2016 0956   BILITOT <0.2 12/23/2016 0956   BILIDIR 0.1 02/15/2015 1010   IBILI 0.2 12/28/2011 0853      Component Value Date/Time   TSH 1.170 12/23/2016 0956   TSH 1.76 03/05/2016 0958   TSH 1.86 02/15/2015 1010   Results for BASSHEVA, FLURY (MRN [id number]) as of 05/24/2017 09:16  Ref. Range 12/23/2016 09:56  Vitamin D, 25-Hydroxy Latest Ref Range: 30.0 - 100.0 ng/mL 37.8    ASSESSMENT AND PLAN: Type 2 diabetes mellitus without complication, without long-term current use of insulin (HCC)  Class 2 severe obesity with serious comorbidity and body mass index (BMI) of 38.0 to 38.9 in adult, unspecified obesity type ([place])  PLAN:  Diabetes II Heather [name] has been given extensive diabetes education by myself today including ideal fasting and post-prandial blood glucose readings, individual ideal Hgb A1c goals and hypoglycemia prevention. We discussed the importance of good blood sugar control to decrease the likelihood of diabetic complications such as nephropathy, neuropathy, limb loss, blindness, coronary artery disease, and death. We discussed the importance of intensive lifestyle modification including diet, exercise and weight loss as the first line treatment  for diabetes. Heather [name] agrees to continue with diet, exercise and weight loss. We will recheck labs next month and Heather [name] agrees to continue metformin and will follow up at the agreed upon time.  We spent > than 50% of the 15 minute visit on the counseling as documented in the note.  Obesity Heather [name] is currently in the action stage of change. As such, her goal is to continue with  weight loss efforts She has agreed to follow a lower carbohydrate, vegetable and lean protein rich diet plan Heather [name] has been instructed to work up to a goal of 150 minutes of combined cardio and strengthening exercise per week or upper body exercises with bands for weight loss and overall health benefits. We discussed the following Behavioral Modification Strategies today: increasing lean protein intake and decreasing simple carbohydrates   Heather [name] has agreed to follow up with our clinic in 2 weeks. She was informed of the importance of frequent follow up visits to maximize her success with intensive lifestyle modifications for her multiple health conditions.   OBESITY BEHAVIORAL INTERVENTION VISIT  Today's visit was # 10 out of 22.  Starting weight: 268 lbs Starting date: 12/23/16 Today's weight : 223 lbs  Today's date: 05/20/2017 Total lbs lost to date: 05/20/17 (Patients must lose 7 lbs in the first 6 months to continue with counseling)   ASK: We discussed the diagnosis of obesity with Heather [name] today and Heather [name] agreed to give [name] permission to discuss obesity behavioral modification therapy today.  ASSESS: Heather [name] has the diagnosis of obesity and her BMI today is 38.26 Heather [name] is in the action stage of change   ADVISE: Heather [name] was educated on the multiple health risks of obesity as well as the benefit of weight loss to improve her health. She was advised of the need for long term treatment and the importance of lifestyle modifications.  AGREE: Multiple dietary modification options and treatment options were discussed and  Heather [name] agreed to the above obesity treatment plan.  I, Doreene Nest, am acting as transcriptionist for [name], MD  I have reviewed the above documentation for accuracy and completeness, and I agree with the above. -[name], MD

## 2017-05-25 ENCOUNTER — Telehealth: Payer: Self-pay | Admitting: *Deleted

## 2017-05-25 NOTE — Telephone Encounter (Signed)
"  I am scheduled for surgery on April 5.  I am taking a blood thinner.  So I wanted to check with you to see if I needed to be discontinuing the blood thinner."

## 2017-05-26 NOTE — Telephone Encounter (Signed)
I am returning your call.  I will send a medical clearance letter to Dr. Aundra Dubin.  Have you tried to call and ask him?  "I called but no one has returned my call."  I'll let you know what he says once I get a response.

## 2017-05-27 NOTE — Telephone Encounter (Signed)
I am calling to let you know that Dr. [name] said you can stop the Plavix 5 days before your surgery date.  He said you can start taking it again after your surgery.  "Got it, thank you."

## 2017-05-27 NOTE — Telephone Encounter (Signed)
-----   Message from Larey Dresser, MD sent at 05/26/2017 10:39 PM EDT ----- OK to hold Plavix x 5 days for surgery then restart afterwards.   ----- Message ----- From: Lolita Rieger Sent: 05/26/2017   3:43 PM To: Larey Dresser, MD

## 2017-06-01 ENCOUNTER — Ambulatory Visit (INDEPENDENT_AMBULATORY_CARE_PROVIDER_SITE_OTHER): Payer: Medicare Other | Admitting: Physician Assistant

## 2017-06-01 VITALS — BP 114/70 | HR 80 | Temp 97.9°F | Ht 64.0 in | Wt 218.0 lb

## 2017-06-01 DIAGNOSIS — E119 Type 2 diabetes mellitus without complications: Secondary | ICD-10-CM | POA: Diagnosis not present

## 2017-06-01 DIAGNOSIS — Z6837 Body mass index (BMI) 37.0-37.9, adult: Secondary | ICD-10-CM | POA: Diagnosis not present

## 2017-06-01 NOTE — Progress Notes (Signed)
Office: [phone]  /  Fax: [phone]   HPI:   Chief Complaint: OBESITY Heather [name] is here to discuss her progress with her obesity treatment plan. She is on the lower carbohydrate, vegetable and lean protein rich diet plan and is following her eating plan approximately 85 % of the time. She states she is exercising on elliptical for 30 minutes 4 times per week. Heather [name] continues to do well with weight loss. She states she enjoys the low carb meal plan and requested to stay on it until her next visit. Her weight is 218 lb (98.9 kg) today and has had a weight loss of 5 pounds over a period of 2 weeks since her last visit. She has lost 50 lbs since starting treatment with [name].  Diabetes II non insulin without complications Heather [name] has a diagnosis of diabetes type II. Heather [name] is not checking her blood sugar at home and she denies any hypoglycemic episodes. She is on metformin. Heather [name] is advised on going back to the category 2 meal plan, as it is unsure if she will develop any hypoglycemia on the low carb meal plan. She has been working on intensive lifestyle modifications including diet, exercise, and weight loss to help control her blood glucose levels.  ALLERGIES: Allergies  Allergen Reactions  . Aspirin   . Coconut Oil   . Latex   . Lisinopril     REACTION: cough  . Metoprolol Itching  . Peanut-Containing Drug Products   . Penicillins     REACTION: rash  . Prednisone   . Strawberry Extract     MEDICATIONS: Current Outpatient Medications on File Prior to Visit  Medication Sig Dispense Refill  . acetaminophen (TYLENOL) 500 MG tablet Take 650 mg by mouth 3 (three) times daily as needed for pain.     [name] ALLEGRA-D ALLERGY & CONGESTION 180-240 MG 24 hr tablet TAKE 1 TABLET BY MOUTH DAILY 30 tablet 0  . Calcium Carbonate-Vitamin D (CALCIUM 600 + D PO) Take 2 tablets by mouth daily.      . clopidogrel (PLAVIX) 75 MG tablet Take 1 tablet (75 mg total) by mouth daily. Needs office visit 30 tablet  0  . Ferrous Sulfate (IRON) 325 (65 FE) MG TABS Take 1 tablet by mouth daily.      . furosemide (LASIX) 40 MG tablet TAKE 1 TABLET EVERY DAY 90 tablet 3  . gabapentin (NEURONTIN) 100 MG capsule TAKE 2 CAPSULES(200 MG) BY MOUTH AT BEDTIME 180 capsule 1  . glucosamine-chondroitin 500-400 MG tablet Take 1 tablet by mouth 2 (two) times daily.      [name] KLOR-CON M20 20 MEQ tablet TAKE 1 TABLET EVERY DAY 90 tablet 3  . metFORMIN (GLUCOPHAGE-XR) 500 MG 24 hr tablet TAKE 2 TABLETS TWICE DAILY 360 tablet 3  . Multiple Vitamin (MULTIVITAMIN) tablet Take 1 tablet by mouth daily.      . naproxen sodium (ANAPROX) 220 MG tablet Take 220 mg by mouth as needed.    . Omega-3 Fatty Acids (FISH OIL) 1000 MG CAPS Take 1 capsule by mouth daily.    [name] omeprazole (PRILOSEC) 20 MG capsule Take 20 mg by mouth daily as needed.     . Probiotic Product (PROBIOTIC-10) CAPS Take 1 capsule by mouth daily.    . pseudoephedrine-acetaminophen (TYLENOL SINUS) 30-500 MG TABS tablet Take 1 tablet by mouth as needed.    . pyridOXINE (VITAMIN B-6) 100 MG tablet Take 200 mg by mouth daily.    . rosuvastatin (CRESTOR) 40 MG  tablet TAKE 1 TABLET EVERY DAY 90 tablet 3  . SF 5000 PLUS 1.1 % CREA dental cream   0  . topiramate (TOPAMAX) 100 MG tablet TAKE 1 TABLET EVERY DAY 90 tablet 3  . Turmeric 500 MG TABS Take 2 tablets by mouth daily.    [name] venlafaxine XR (EFFEXOR-XR) 37.5 MG 24 hr capsule Take 1 capsule (37.5 mg total) by mouth daily with breakfast. 90 capsule 1  . vitamin E 200 UNIT capsule Take 200 Units by mouth daily.     Current Facility-Administered Medications on File Prior to Visit  Medication Dose Route Frequency Provider Last Rate Last Dose  . 0.9 %  sodium chloride infusion  500 mL Intravenous Continuous Nandigam, Venia Minks, MD        PAST MEDICAL HISTORY: Past Medical History:  Diagnosis Date  . Abnormal Pap smear   . ALLERGIC RHINITIS 10/22/2006  . ANEMIA 12/18/2008  . Arthritis   . ASYMPTOMATIC POSTMENOPAUSAL  STATUS 11/22/2007  . Back pain   . Complication of anesthesia   . Degenerative arthritis   . DIABETES MELLITUS, TYPE II 01/13/2007  . Dyslipidemia   . Fibroid   . GERD 07/20/2007  . GOITER, MULTINODULAR 07/20/2007  . Headache(784.0) 07/20/2007  . HEARING LOSS 11/22/2007  . Hiatal hernia   . Hx of colposcopy with cervical biopsy   . HYPERCHOLESTEROLEMIA 01/13/2007  . Hyperglycemia   . HYPERTENSION 10/22/2006  . NASH (nonalcoholic steatohepatitis)   . Nocturnal hypoxemia 02/17/2013  . Obesity   . Obstructive sleep apnea   . OSTEOARTHRITIS 10/22/2006  . Other chronic nonalcoholic liver disease 11/23/2681  . Sleep apnea     PAST SURGICAL HISTORY: Past Surgical History:  Procedure Laterality Date  . BREAST SURGERY     Breast reduction  . CATARACT EXTRACTION, BILATERAL    . DEXA  08/2005  . DILATION AND CURETTAGE OF UTERUS    . ELECTROCARDIOGRAM  10/15/2006  . ESOPHAGOGASTRODUODENOSCOPY  12/08/2005  . Stress Cardiolite  10/21/2005  . sweat gland removal    . WISDOM TOOTH EXTRACTION      SOCIAL HISTORY: Social History   Tobacco Use  . Smoking status: Former Smoker    Packs/day: 2.00    Years: 20.00    Pack years: 40.00    Types: Cigarettes    Last attempt to quit: 03/03/1979    Years since quitting: 38.2  . Smokeless tobacco: Never Used  Substance Use Topics  . Alcohol use: Yes    Comment: rare  . Drug use: No    FAMILY HISTORY: Family History  Problem Relation Age of Onset  . Asthma Mother   . Depression Mother   . Bipolar disorder Mother   . Dementia Mother   . Arthritis Mother   . Breast cancer Mother        primary  . Colon cancer Mother        mets from breast  . Cancer Mother   . Hypertension Father   . Migraines Father   . Cancer Maternal Aunt   . Heart disease Maternal Grandmother   . Cancer Maternal Grandfather     ROS: Review of Systems  Constitutional: Positive for weight loss.  Endo/Heme/Allergies:       Negative for hypoglycemia     PHYSICAL EXAM: Blood pressure 114/70, pulse 80, temperature 97.9 F (36.6 C), temperature source Oral, height 5' 4"  (1.626 m), weight 218 lb (98.9 kg), SpO2 98 %. Body mass index is 37.42 kg/m. Physical Exam  Constitutional: She is oriented to person, place, and time. She appears well-developed and well-nourished.  Cardiovascular: Normal rate.  Pulmonary/Chest: Effort normal.  Musculoskeletal: Normal range of motion.  Neurological: She is oriented to person, place, and time.  Skin: Skin is warm and dry.  Psychiatric: She has a normal mood and affect. Her behavior is normal.  Vitals reviewed.   RECENT LABS AND TESTS: BMET    Component Value Date/Time   NA 142 12/23/2016 0956   K 3.6 12/23/2016 0956   CL 102 12/23/2016 0956   CO2 30 (H) 12/23/2016 0956   GLUCOSE 89 12/23/2016 0956   GLUCOSE 116 (H) 03/05/2016 0958   BUN 13 12/23/2016 0956   CREATININE 0.74 12/23/2016 0956   CREATININE 0.81 12/28/2011 0853   CALCIUM 9.3 12/23/2016 0956   GFRNONAA 82 12/23/2016 0956   GFRAA 95 12/23/2016 0956   Lab Results  Component Value Date   HGBA1C 5.9 03/15/2017   HGBA1C 6.3 (H) 12/23/2016   HGBA1C 6.3 09/08/2016   HGBA1C 6.4 03/05/2016   HGBA1C 5.9 08/14/2015   Lab Results  Component Value Date   INSULIN 20.9 12/23/2016   CBC    Component Value Date/Time   WBC 4.9 12/23/2016 0956   WBC 7.2 03/05/2016 0958   RBC 4.32 12/23/2016 0956   RBC 4.46 03/05/2016 0958   HGB 12.1 12/23/2016 0956   HCT 37.4 12/23/2016 0956   PLT 300.0 03/05/2016 0958   MCV 87 12/23/2016 0956   MCH 28.0 12/23/2016 0956   MCH 29.4 12/28/2011 0853   MCHC 32.4 12/23/2016 0956   MCHC 32.9 03/05/2016 0958   RDW 17.5 (H) 12/23/2016 0956   LYMPHSABS 1.2 12/23/2016 0956   MONOABS 0.4 03/05/2016 0958   EOSABS 0.1 12/23/2016 0956   BASOSABS 0.0 12/23/2016 0956   Iron/TIBC/Ferritin/ %Sat    Component Value Date/Time   IRON 38 12/23/2016 0956   TIBC 271 12/23/2016 0956   FERRITIN 40 12/23/2016  0956   IRONPCTSAT 14 (L) 12/23/2016 0956   Lipid Panel     Component Value Date/Time   CHOL 178 12/23/2016 0956   TRIG 80 12/23/2016 0956   HDL 81 12/23/2016 0956   CHOLHDL 3 03/05/2016 0958   VLDL 26.0 03/05/2016 0958   LDLCALC 81 12/23/2016 0956   Hepatic Function Panel     Component Value Date/Time   PROT 7.4 12/23/2016 0956   ALBUMIN 4.1 12/23/2016 0956   AST 17 12/23/2016 0956   ALT 13 12/23/2016 0956   ALKPHOS 71 12/23/2016 0956   BILITOT <0.2 12/23/2016 0956   BILIDIR 0.1 02/15/2015 1010   IBILI 0.2 12/28/2011 0853      Component Value Date/Time   TSH 1.170 12/23/2016 0956   TSH 1.76 03/05/2016 0958   TSH 1.86 02/15/2015 1010   Results for WYNONIA, MEDERO (MRN [id number]) as of 06/01/2017 15:11  Ref. Range 12/23/2016 09:56  Vitamin D, 25-Hydroxy Latest Ref Range: 30.0 - 100.0 ng/mL 37.8   ASSESSMENT AND PLAN: Type 2 diabetes mellitus without complication, without long-term current use of insulin (HCC)  Class 2 severe obesity with serious comorbidity and body mass index (BMI) of 37.0 to 37.9 in adult, unspecified obesity type ([place])  PLAN:  Diabetes II non insulin without complications Heather [name] has been given extensive diabetes education by myself today including ideal fasting and post-prandial blood glucose readings, individual ideal Hgb A1c goals  and hypoglycemia prevention. We discussed the importance of good blood sugar control to decrease the likelihood of diabetic complications such  as nephropathy, neuropathy, limb loss, blindness, coronary artery disease, and death. We discussed the importance of intensive lifestyle modification including diet, exercise and weight loss as the first line treatment for diabetes. Heather [name] agrees to continue her diabetes medications and will follow up at the agreed upon time.  We spent > than 50% of the 15 minute visit on the counseling as documented in the note.  Obesity Heather [name] is currently in the action stage of change. As such,  her goal is to continue with weight loss efforts She has agreed to follow the Category 2 plan Heather [name] has been instructed to work up to a goal of 150 minutes of combined cardio and strengthening exercise per week for weight loss and overall health benefits. We discussed the following Behavioral Modification Strategies today: increasing lean protein intake and work on meal planning and easy cooking plans  Heather [name] has agreed to follow up with our clinic in 2 weeks. She was informed of the importance of frequent follow up visits to maximize her success with intensive lifestyle modifications for her multiple health conditions.   OBESITY BEHAVIORAL INTERVENTION VISIT  Today's visit was # 11 out of 22.  Starting weight: 268 lbs Starting date: 12/23/16 Today's weight : 218 lbs Today's date: 06/01/2017 Total lbs lost to date: 30 (Patients must lose 7 lbs in the first 6 months to continue with counseling)   ASK: We discussed the diagnosis of obesity with Heather [name] today and Heather [name] agreed to give [name] permission to discuss obesity behavioral modification therapy today.  ASSESS: Heather [name] has the diagnosis of obesity and her BMI today is 37.4 Heather [name] is in the action stage of change   ADVISE: Heather [name] was educated on the multiple health risks of obesity as well as the benefit of weight loss to improve her health. She was advised of the need for long term treatment and the importance of lifestyle modifications.  AGREE: Multiple dietary modification options and treatment options were discussed and  Heather [name] agreed to the above obesity treatment plan.   Corey Skains, am acting as transcriptionist for [employer], PA-C I, Lacy Duverney [facility], have reviewed this note and agree with its content

## 2017-06-02 ENCOUNTER — Other Ambulatory Visit: Payer: Self-pay | Admitting: Podiatry

## 2017-06-02 MED ORDER — CLINDAMYCIN HCL 150 MG PO CAPS: 150.0000 mg | ORAL_CAPSULE | Freq: Three times a day (TID) | ORAL | 0 refills | Status: DC

## 2017-06-02 MED ORDER — ONDANSETRON HCL 4 MG PO TABS: 4.0000 mg | ORAL_TABLET | Freq: Three times a day (TID) | ORAL | 0 refills | Status: DC | PRN

## 2017-06-02 MED ORDER — OXYCODONE-ACETAMINOPHEN 10-325 MG PO TABS: 1.0000 | ORAL_TABLET | ORAL | 0 refills | Status: AC | PRN

## 2017-06-04 ENCOUNTER — Encounter: Payer: Self-pay | Admitting: Podiatry

## 2017-06-04 DIAGNOSIS — E7800 Pure hypercholesterolemia, unspecified: Secondary | ICD-10-CM | POA: Diagnosis not present

## 2017-06-04 DIAGNOSIS — M2011 Hallux valgus (acquired), right foot: Secondary | ICD-10-CM | POA: Diagnosis not present

## 2017-06-04 DIAGNOSIS — M25571 Pain in right ankle and joints of right foot: Secondary | ICD-10-CM | POA: Diagnosis not present

## 2017-06-04 DIAGNOSIS — M2041 Other hammer toe(s) (acquired), right foot: Secondary | ICD-10-CM | POA: Diagnosis not present

## 2017-06-04 DIAGNOSIS — M21611 Bunion of right foot: Secondary | ICD-10-CM | POA: Diagnosis not present

## 2017-06-07 ENCOUNTER — Telehealth: Payer: Self-pay | Admitting: *Deleted

## 2017-06-07 NOTE — Telephone Encounter (Signed)
Pt states she had surgery 06/04/2017, reading the post op sheet and she would like to know if she has to stay in the boot. I told pt that if she was resting and not going to be up walking or going to sleep she could have the boot off, but to sleep and walk she had to have the boot on. Pt states her pain block did not last 19 hours. I told her that on occasion the leg block could last less than 24 hours or over 72 hours. I asked pt if she had started the pain medication and she said she had and I told her good, and she asked if I had said it was good she was having pain. I explained that it was good that she knew to begin the pain medication. Pt asked how much pain should she be having and I told her how pts recognized pain was different for every one, but as a general rule she should be aware that she had surgery. Pt states she it is much more than that. I told her there were comfort measures that would help and explained how to remove the boot, open-ended sock and ace wrap only, to elevate the foot for 15 minutes and after the 15 minutes reapply the ace beginning at the toes and wrap looser up the leg, reapply the sock and boot. Pt asked some one in the background if there was anything else she should ask and that person said to let the person on the phone go.

## 2017-06-08 ENCOUNTER — Other Ambulatory Visit: Payer: Self-pay | Admitting: Endocrinology

## 2017-06-10 ENCOUNTER — Encounter: Payer: Self-pay | Admitting: Podiatry

## 2017-06-10 ENCOUNTER — Ambulatory Visit (INDEPENDENT_AMBULATORY_CARE_PROVIDER_SITE_OTHER): Payer: Medicare Other

## 2017-06-10 ENCOUNTER — Ambulatory Visit (INDEPENDENT_AMBULATORY_CARE_PROVIDER_SITE_OTHER): Payer: Medicare Other | Admitting: Podiatry

## 2017-06-10 VITALS — BP 121/68 | HR 87 | Temp 98.5°F

## 2017-06-10 DIAGNOSIS — M2011 Hallux valgus (acquired), right foot: Secondary | ICD-10-CM

## 2017-06-10 DIAGNOSIS — M2041 Other hammer toe(s) (acquired), right foot: Secondary | ICD-10-CM | POA: Diagnosis not present

## 2017-06-12 NOTE — Progress Notes (Signed)
Heather [name] presents today 1 week status post [facility] [place] with hammertoe repair #2 states that she had a lot of pain the first couple of days after surgery but currently she states she is doing okay.  Objective: Vital signs are stable she is alert and oriented x3.  Pulses are palpable.  A sterile dressing was intact was removed he did demonstrate mild ecchymosis no erythema cellulitis drainage as she had good range of motion.  Radiograph does demonstrate a long arm osteotomy with double screw fixation which appears to have been somewhat dislodged but patient does not relate any trauma.  The head is still instilled in good alignment with the toe but on the oblique view appears to be plantar flex or displaced.  Assessment: 1 week status post [place] bunionectomy and hammertoe repair.  Plan: Redressed today dresser compressive dressing follow-up with her in 1 week for redressed.

## 2017-06-17 ENCOUNTER — Encounter: Payer: Self-pay | Admitting: Podiatry

## 2017-06-17 ENCOUNTER — Ambulatory Visit (INDEPENDENT_AMBULATORY_CARE_PROVIDER_SITE_OTHER): Payer: Medicare Other | Admitting: Physician Assistant

## 2017-06-17 ENCOUNTER — Ambulatory Visit (INDEPENDENT_AMBULATORY_CARE_PROVIDER_SITE_OTHER): Payer: Medicare Other | Admitting: Podiatry

## 2017-06-17 DIAGNOSIS — M2011 Hallux valgus (acquired), right foot: Secondary | ICD-10-CM | POA: Diagnosis not present

## 2017-06-17 DIAGNOSIS — M2041 Other hammer toe(s) (acquired), right foot: Secondary | ICD-10-CM

## 2017-06-17 NOTE — Progress Notes (Signed)
She presents today status post [place] bunion repair and hammertoe repair second right foot.  She states that is doing okay just looks horrible.  Date of surgery June 04, 2017.  She denies fever chills nausea vomiting muscle aches pains calf pain chest pain shortness of breath.  Objective: Vital signs are stable alert and oriented x3.  Dry sterile dressing was removed demonstrates sutures are intact margins well coapted though she does have some bleeding beneath the skin which is 1 delay S removing the sutures.  She has good range of motion of the first metatarsophalangeal joint appears to be pain-free.  Assessment: Well-healing surgical foot.  Plan: We will follow-up with her in 1 week to have the sutures removed if they are able to be removed at that time.  She will be placed in a compression anklet and a Darco.

## 2017-06-24 ENCOUNTER — Ambulatory Visit (INDEPENDENT_AMBULATORY_CARE_PROVIDER_SITE_OTHER): Payer: Medicare Other | Admitting: Podiatry

## 2017-06-24 ENCOUNTER — Ambulatory Visit (INDEPENDENT_AMBULATORY_CARE_PROVIDER_SITE_OTHER): Payer: Medicare Other

## 2017-06-24 DIAGNOSIS — M2041 Other hammer toe(s) (acquired), right foot: Secondary | ICD-10-CM

## 2017-06-24 DIAGNOSIS — M2011 Hallux valgus (acquired), right foot: Secondary | ICD-10-CM

## 2017-06-24 NOTE — Progress Notes (Signed)
  Subjective:  Patient ID: Heather [name], female    DOB: 03-27-1945,  MRN: [id number]  Chief Complaint  Patient presents with  . Routine Post [facility] 04.05.2019 [place] Bunionectomy Rt; Hammertoe Repair 2nd Rt     DOS: 06/04/17 Procedure: [place] Bunionectomy R, Hammertoe Repair R  72 y.o. female returns for post-op check. Denies N/V/F/Ch.  Pain controlled.  Denies postoperative issues  Objective:   General AA&O x3. Normal mood and affect.  Vascular Foot warm and well perfused.  Neurologic Gross sensation intact.  Dermatologic  second toe incisions healed.  First metatarsal incision healing well but with areas of possible prior blistering with redundant skin slight maceration along the incision.   Orthopedic: Tenderness to palpation noted about the surgical site.    Assessment & Plan:  Patient was evaluated and treated and all questions answered.  S/p right [place] bunionectomy, left and right second hammertoe repair -X-rays taken consistent with postop state. -Progressing as expected post-operatively. -Sutures: Second toe sutures removed.  Betadine applied to the first metatarsal phalangeal joint incision.  Slightly macerated evidence of possible prior blistering.  Will avoid showering until next visit -Medications refilled: none -Foot redressed.  Return in about 1 week (around 07/01/2017) for hyatt patient.

## 2017-06-29 ENCOUNTER — Encounter: Payer: Self-pay | Admitting: Podiatry

## 2017-06-29 ENCOUNTER — Ambulatory Visit (INDEPENDENT_AMBULATORY_CARE_PROVIDER_SITE_OTHER): Payer: Medicare Other | Admitting: Podiatry

## 2017-06-29 DIAGNOSIS — M2041 Other hammer toe(s) (acquired), right foot: Secondary | ICD-10-CM

## 2017-06-29 DIAGNOSIS — M2011 Hallux valgus (acquired), right foot: Secondary | ICD-10-CM

## 2017-06-29 NOTE — Progress Notes (Signed)
She presents today for another postop visit date of surgery June 04, 2017 status post [facility] bunionectomy and hammertoe repair second right.  She states that is feeling okay but is tight.  She denies fever chills nausea vomiting muscle aches and pains.  Objective: Vital signs are stable she is alert and oriented x3 dry sterile dressing was removed demonstrates mildly diminished right foot no erythema sialitis drainage or odor she has dry eschar overlying the surgical site which is just very superficial where the skin separated with blood beneath it.  There is no signs of infection.  I was able to remove some of this today.  Assessment slowly healing wound but I expected to really change over the next couple of weeks.  Plan: Redressed today dressed with compressive dressing

## 2017-07-01 ENCOUNTER — Ambulatory Visit (INDEPENDENT_AMBULATORY_CARE_PROVIDER_SITE_OTHER): Payer: Medicare Other | Admitting: Family Medicine

## 2017-07-01 VITALS — BP 105/68 | HR 84 | Temp 98.1°F | Ht 64.0 in | Wt 219.0 lb

## 2017-07-01 DIAGNOSIS — Z6837 Body mass index (BMI) 37.0-37.9, adult: Secondary | ICD-10-CM

## 2017-07-01 DIAGNOSIS — E669 Obesity, unspecified: Secondary | ICD-10-CM

## 2017-07-01 DIAGNOSIS — N3941 Urge incontinence: Secondary | ICD-10-CM

## 2017-07-01 DIAGNOSIS — E66812 Obesity, class 2: Secondary | ICD-10-CM

## 2017-07-06 ENCOUNTER — Ambulatory Visit (INDEPENDENT_AMBULATORY_CARE_PROVIDER_SITE_OTHER): Payer: Medicare Other | Admitting: Podiatry

## 2017-07-06 ENCOUNTER — Encounter: Payer: Self-pay | Admitting: Podiatry

## 2017-07-06 DIAGNOSIS — Z9889 Other specified postprocedural states: Secondary | ICD-10-CM

## 2017-07-06 DIAGNOSIS — M2041 Other hammer toe(s) (acquired), right foot: Secondary | ICD-10-CM

## 2017-07-06 DIAGNOSIS — M2011 Hallux valgus (acquired), right foot: Secondary | ICD-10-CM

## 2017-07-06 NOTE — Progress Notes (Signed)
She presents today date of surgery June 04, 2017 status post [facility] bunionectomy right with hammertoe repair and right.  She states that it stinging some and getting some tightness and itching.  Objective: Presents today her Darco shoe dry sterile dressing intact was removed demonstrates dry xerotic skin closed wounds tight range of motion of the first metatarsophalangeal joint and toe.  Assessment: Well-healing [place] bunion repair.  Plan: At this point I am going to recommend that she start soaking the foot soapy water to help breakdown the skin so that she could remove it.  This should loosen a lot of the dry skin and she should be able to wipe it off of the dry cough.  She is on apply lotion and I will follow-up with her in 2 weeks.

## 2017-07-06 NOTE — Progress Notes (Signed)
Office: [phone]  /  Fax: [phone]   HPI:   Chief Complaint: OBESITY Heather [name] is here to discuss her progress with her obesity treatment plan. She is on the Category 2 plan and is following her eating plan approximately 0 % of the time. She states she is exercising 0 minutes 0 times per week. Heather [name] had bunionectomy and hammer toe repair approximately one month ago and was unable to walk or exercise, wearing a boot. She would like to start using her elliptical again and states she is cleared by her surgeon. Her weight is 219 lb (99.3 kg) today and has had a weight gain of 1 pound over a period of 4 weeks since her last visit. She has lost 49 lbs since starting treatment with [name].  Urge Incontinence (new diagnosis) [name] notes an increase in urge to urinate and at times, she doesn't make it to the toilet. She has decreased her water intake in response to feeling a bit lightheaded. She denies leakage with coughs or sneezes.  ALLERGIES: Allergies  Allergen Reactions  . Aspirin   . Coconut Oil   . Latex   . Lisinopril     REACTION: cough  . Metoprolol Itching  . Peanut-Containing Drug Products   . Penicillins     REACTION: rash  . Prednisone   . Strawberry Extract     MEDICATIONS: Current Outpatient Medications on File Prior to Visit  Medication Sig Dispense Refill  . acetaminophen (TYLENOL) 500 MG tablet Take 650 mg by mouth 3 (three) times daily as needed for pain.     [name] ALLEGRA-D ALLERGY & CONGESTION 180-240 MG 24 hr tablet TAKE 1 TABLET BY MOUTH DAILY 30 tablet 0  . Calcium Carbonate-Vitamin D (CALCIUM 600 + D PO) Take 2 tablets by mouth daily.      . clindamycin (CLEOCIN) 150 MG capsule Take 1 capsule (150 mg total) by mouth 3 (three) times daily. 30 capsule 0  . clopidogrel (PLAVIX) 75 MG tablet Take 1 tablet (75 mg total) by mouth daily. Needs office visit 30 tablet 0  . Ferrous Sulfate (IRON) 325 (65 FE) MG TABS Take 1 tablet by mouth daily.      . furosemide (LASIX)  40 MG tablet TAKE 1 TABLET EVERY DAY 90 tablet 3  . gabapentin (NEURONTIN) 100 MG capsule TAKE 2 CAPSULES(200 MG) BY MOUTH AT BEDTIME 180 capsule 1  . glucosamine-chondroitin 500-400 MG tablet Take 1 tablet by mouth 2 (two) times daily.      [name] KLOR-CON M20 20 MEQ tablet TAKE 1 TABLET EVERY DAY 90 tablet 3  . metFORMIN (GLUCOPHAGE-XR) 500 MG 24 hr tablet TAKE 2 TABLETS TWICE DAILY 360 tablet 3  . Multiple Vitamin (MULTIVITAMIN) tablet Take 1 tablet by mouth daily.      . naproxen sodium (ANAPROX) 220 MG tablet Take 220 mg by mouth as needed.    . Omega-3 Fatty Acids (FISH OIL) 1000 MG CAPS Take 1 capsule by mouth daily.    [name] omeprazole (PRILOSEC) 20 MG capsule Take 20 mg by mouth daily as needed.     . ondansetron (ZOFRAN) 4 MG tablet Take 1 tablet (4 mg total) by mouth every 8 (eight) hours as needed for nausea or vomiting. 20 tablet 0  . Probiotic Product (PROBIOTIC-10) CAPS Take 1 capsule by mouth daily.    . pseudoephedrine-acetaminophen (TYLENOL SINUS) 30-500 MG TABS tablet Take 1 tablet by mouth as needed.    . pyridOXINE (VITAMIN B-6) 100 MG tablet Take 200  mg by mouth daily.    . rosuvastatin (CRESTOR) 40 MG tablet TAKE 1 TABLET EVERY DAY 90 tablet 3  . SF 5000 PLUS 1.1 % CREA dental cream   0  . topiramate (TOPAMAX) 100 MG tablet TAKE 1 TABLET EVERY DAY 90 tablet 3  . Turmeric 500 MG TABS Take 2 tablets by mouth daily.    [name] venlafaxine XR (EFFEXOR-XR) 37.5 MG 24 hr capsule Take 1 capsule (37.5 mg total) by mouth daily with breakfast. 90 capsule 1  . vitamin E 200 UNIT capsule Take 200 Units by mouth daily.     Current Facility-Administered Medications on File Prior to Visit  Medication Dose Route Frequency Provider Last Rate Last Dose  . 0.9 %  sodium chloride infusion  500 mL Intravenous Continuous Nandigam, Venia Minks, MD        PAST MEDICAL HISTORY: Past Medical History:  Diagnosis Date  . Abnormal Pap smear   . ALLERGIC RHINITIS 10/22/2006  . ANEMIA 12/18/2008  . Arthritis     . ASYMPTOMATIC POSTMENOPAUSAL STATUS 11/22/2007  . Back pain   . Complication of anesthesia   . Degenerative arthritis   . DIABETES MELLITUS, TYPE II 01/13/2007  . Dyslipidemia   . Fibroid   . GERD 07/20/2007  . GOITER, MULTINODULAR 07/20/2007  . Headache(784.0) 07/20/2007  . HEARING LOSS 11/22/2007  . Hiatal hernia   . Hx of colposcopy with cervical biopsy   . HYPERCHOLESTEROLEMIA 01/13/2007  . Hyperglycemia   . HYPERTENSION 10/22/2006  . NASH (nonalcoholic steatohepatitis)   . Nocturnal hypoxemia 02/17/2013  . Obesity   . Obstructive sleep apnea   . OSTEOARTHRITIS 10/22/2006  . Other chronic nonalcoholic liver disease 3/90/3009  . Sleep apnea     PAST SURGICAL HISTORY: Past Surgical History:  Procedure Laterality Date  . BREAST SURGERY     Breast reduction  . CATARACT EXTRACTION, BILATERAL    . DEXA  08/2005  . DILATION AND CURETTAGE OF UTERUS    . ELECTROCARDIOGRAM  10/15/2006  . ESOPHAGOGASTRODUODENOSCOPY  12/08/2005  . Stress Cardiolite  10/21/2005  . sweat gland removal    . WISDOM TOOTH EXTRACTION      SOCIAL HISTORY: Social History   Tobacco Use  . Smoking status: Former Smoker    Packs/day: 2.00    Years: 20.00    Pack years: 40.00    Types: Cigarettes    Last attempt to quit: 03/03/1979    Years since quitting: 38.3  . Smokeless tobacco: Never Used  Substance Use Topics  . Alcohol use: Yes    Comment: rare  . Drug use: No    FAMILY HISTORY: Family History  Problem Relation Age of Onset  . Asthma Mother   . Depression Mother   . Bipolar disorder Mother   . Dementia Mother   . Arthritis Mother   . Breast cancer Mother        primary  . Colon cancer Mother        mets from breast  . Cancer Mother   . Hypertension Father   . Migraines Father   . Cancer Maternal Aunt   . Heart disease Maternal Grandmother   . Cancer Maternal Grandfather     ROS: Review of Systems  Constitutional: Negative for weight loss.  Genitourinary: Positive for  urgency.  Neurological:       Positive for lightheadedness    PHYSICAL EXAM: Blood pressure 105/68, pulse 84, temperature 98.1 F (36.7 C), temperature source Oral, height 5' 4"  (1.626  m), weight 219 lb (99.3 kg), SpO2 97 %. Body mass index is 37.59 kg/m. Physical Exam  Constitutional: She is oriented to person, place, and time. She appears well-developed and well-nourished.  Cardiovascular: Normal rate.  Pulmonary/Chest: Effort normal.  Musculoskeletal: Normal range of motion.  Neurological: She is oriented to person, place, and time.  Skin: Skin is warm and dry.  Psychiatric: She has a normal mood and affect.  Vitals reviewed.   RECENT LABS AND TESTS: BMET    Component Value Date/Time   NA 142 12/23/2016 0956   K 3.6 12/23/2016 0956   CL 102 12/23/2016 0956   CO2 30 (H) 12/23/2016 0956   GLUCOSE 89 12/23/2016 0956   GLUCOSE 116 (H) 03/05/2016 0958   BUN 13 12/23/2016 0956   CREATININE 0.74 12/23/2016 0956   CREATININE 0.81 12/28/2011 0853   CALCIUM 9.3 12/23/2016 0956   GFRNONAA 82 12/23/2016 0956   GFRAA 95 12/23/2016 0956   Lab Results  Component Value Date   HGBA1C 5.9 03/15/2017   HGBA1C 6.3 (H) 12/23/2016   HGBA1C 6.3 09/08/2016   HGBA1C 6.4 03/05/2016   HGBA1C 5.9 08/14/2015   Lab Results  Component Value Date   INSULIN 20.9 12/23/2016   CBC    Component Value Date/Time   WBC 4.9 12/23/2016 0956   WBC 7.2 03/05/2016 0958   RBC 4.32 12/23/2016 0956   RBC 4.46 03/05/2016 0958   HGB 12.1 12/23/2016 0956   HCT 37.4 12/23/2016 0956   PLT 300.0 03/05/2016 0958   MCV 87 12/23/2016 0956   MCH 28.0 12/23/2016 0956   MCH 29.4 12/28/2011 0853   MCHC 32.4 12/23/2016 0956   MCHC 32.9 03/05/2016 0958   RDW 17.5 (H) 12/23/2016 0956   LYMPHSABS 1.2 12/23/2016 0956   MONOABS 0.4 03/05/2016 0958   EOSABS 0.1 12/23/2016 0956   BASOSABS 0.0 12/23/2016 0956   Iron/TIBC/Ferritin/ %Sat    Component Value Date/Time   IRON 38 12/23/2016 0956   TIBC 271  12/23/2016 0956   FERRITIN 40 12/23/2016 0956   IRONPCTSAT 14 (L) 12/23/2016 0956   Lipid Panel     Component Value Date/Time   CHOL 178 12/23/2016 0956   TRIG 80 12/23/2016 0956   HDL 81 12/23/2016 0956   CHOLHDL 3 03/05/2016 0958   VLDL 26.0 03/05/2016 0958   LDLCALC 81 12/23/2016 0956   Hepatic Function Panel     Component Value Date/Time   PROT 7.4 12/23/2016 0956   ALBUMIN 4.1 12/23/2016 0956   AST 17 12/23/2016 0956   ALT 13 12/23/2016 0956   ALKPHOS 71 12/23/2016 0956   BILITOT <0.2 12/23/2016 0956   BILIDIR 0.1 02/15/2015 1010   IBILI 0.2 12/28/2011 0853      Component Value Date/Time   TSH 1.170 12/23/2016 0956   TSH 1.76 03/05/2016 0958   TSH 1.86 02/15/2015 1010   Results for MAHATHI, POKORNEY (MRN [id number]) as of 07/06/2017 09:49  Ref. Range 12/23/2016 09:56  Vitamin D, 25-Hydroxy Latest Ref Range: 30.0 - 100.0 ng/mL 37.8   ASSESSMENT AND PLAN: Urge incontinence  Class 2 obesity without serious comorbidity with body mass index (BMI) of 37.0 to 37.9 in adult, unspecified obesity type  PLAN:  Urge Incontinence (new diagnosis) Mackenzi was encouraged to increase her H2O intake in the daytime and schedule bathroom breaks every 2 hours to help decrease accidents. Briena is to follow up with  Her PCP in no improvement with this strategy.  We spent > than 50% of the  15 minute visit on the counseling as documented in the note.  Obesity Meira is currently in the action stage of change. As such, her goal is to continue with weight loss efforts She has agreed to follow the Category 2 plan Lota has been instructed to work up to a goal of 150 minutes of combined cardio and strengthening exercise per week or slowly start elliptical for weight loss and overall health benefits. We discussed the following Behavioral Modification Strategies today: increase H2O intake, increasing lean protein intake and decreasing simple carbohydrates   Vanetta has agreed to follow up with  our clinic in 4 weeks. She was informed of the importance of frequent follow up visits to maximize her success with intensive lifestyle modifications for her multiple health conditions.   OBESITY BEHAVIORAL INTERVENTION VISIT  Today's visit was # 12 out of 22.  Starting weight: 268 lbs Starting date: 12/23/16 Today's weight : 219 lbs Today's date: 07/01/2017 Total lbs lost to date: 61 (Patients must lose 7 lbs in the first 6 months to continue with counseling)   ASK: We discussed the diagnosis of obesity with Cherlynn June today and Rukiya agreed to give [name] permission to discuss obesity behavioral modification therapy today.  ASSESS: Leilanie has the diagnosis of obesity and her BMI today is 37.57 Audriella is in the action stage of change   ADVISE: Eliyah was educated on the multiple health risks of obesity as well as the benefit of weight loss to improve her health. She was advised of the need for long term treatment and the importance of lifestyle modifications.  AGREE: Multiple dietary modification options and treatment options were discussed and  Nalla agreed to the above obesity treatment plan.  I, Doreene Nest, am acting as transcriptionist for [name], MD  I have reviewed the above documentation for accuracy and completeness, and I agree with the above. -[name], MD

## 2017-07-07 ENCOUNTER — Encounter: Payer: Self-pay | Admitting: Family Medicine

## 2017-07-08 ENCOUNTER — Telehealth: Payer: Self-pay | Admitting: *Deleted

## 2017-07-08 NOTE — Telephone Encounter (Signed)
Pt states she had surgery the beginning of April with Dr. Milinda Pointer , was seen Tuesday and the dressing was removed. Pt states she is now soaking and the foot is remaining very swollen and when she wipes the toe it bleeds, and the swelling is not going down.

## 2017-07-08 NOTE — Telephone Encounter (Signed)
I spoke with pt and asked if she was using very warm water to the foot, and she denies. I told pt that she should use cool water soapy and rinse well and that would help with the swelling and it would still loosen the dry skin. I asked pt if she felt the bleeding was a scab that had been reconstituted with the water. Pt states her husband had said it was a scab, but she was scared. I told pt that was not unheard of and that she would have the swelling on and off for 6-9 months to varying degrees, but it would gradually go away.

## 2017-07-11 ENCOUNTER — Other Ambulatory Visit: Payer: Self-pay | Admitting: Endocrinology

## 2017-07-12 ENCOUNTER — Ambulatory Visit: Payer: Medicare Other | Admitting: Family Medicine

## 2017-07-13 ENCOUNTER — Ambulatory Visit (HOSPITAL_COMMUNITY)
Admission: RE | Admit: 2017-07-13 | Discharge: 2017-07-13 | Disposition: A | Discharge status: Home / Self Care | Payer: Medicare Other | Source: Ambulatory Visit | Attending: Cardiology | Admitting: Cardiology

## 2017-07-13 ENCOUNTER — Encounter (HOSPITAL_COMMUNITY): Payer: Self-pay

## 2017-07-13 VITALS — BP 112/78 | HR 84 | Wt 220.4 lb

## 2017-07-13 DIAGNOSIS — Z7984 Long term (current) use of oral hypoglycemic drugs: Secondary | ICD-10-CM | POA: Diagnosis not present

## 2017-07-13 DIAGNOSIS — E785 Hyperlipidemia, unspecified: Secondary | ICD-10-CM | POA: Diagnosis not present

## 2017-07-13 DIAGNOSIS — G4733 Obstructive sleep apnea (adult) (pediatric): Secondary | ICD-10-CM | POA: Diagnosis not present

## 2017-07-13 DIAGNOSIS — I2510 Atherosclerotic heart disease of native coronary artery without angina pectoris: Secondary | ICD-10-CM | POA: Insufficient documentation

## 2017-07-13 DIAGNOSIS — Z7902 Long term (current) use of antithrombotics/antiplatelets: Secondary | ICD-10-CM | POA: Insufficient documentation

## 2017-07-13 DIAGNOSIS — Z79899 Other long term (current) drug therapy: Secondary | ICD-10-CM | POA: Diagnosis not present

## 2017-07-13 DIAGNOSIS — Z6837 Body mass index (BMI) 37.0-37.9, adult: Secondary | ICD-10-CM | POA: Insufficient documentation

## 2017-07-13 DIAGNOSIS — I10 Essential (primary) hypertension: Secondary | ICD-10-CM | POA: Diagnosis not present

## 2017-07-13 DIAGNOSIS — Z87891 Personal history of nicotine dependence: Secondary | ICD-10-CM | POA: Diagnosis not present

## 2017-07-13 DIAGNOSIS — I5032 Chronic diastolic (congestive) heart failure: Secondary | ICD-10-CM

## 2017-07-13 DIAGNOSIS — I110 Hypertensive heart disease with heart failure: Secondary | ICD-10-CM | POA: Diagnosis not present

## 2017-07-13 DIAGNOSIS — E119 Type 2 diabetes mellitus without complications: Secondary | ICD-10-CM | POA: Insufficient documentation

## 2017-07-13 DIAGNOSIS — K219 Gastro-esophageal reflux disease without esophagitis: Secondary | ICD-10-CM | POA: Insufficient documentation

## 2017-07-13 DIAGNOSIS — E7800 Pure hypercholesterolemia, unspecified: Secondary | ICD-10-CM | POA: Diagnosis not present

## 2017-07-13 DIAGNOSIS — M1990 Unspecified osteoarthritis, unspecified site: Secondary | ICD-10-CM | POA: Insufficient documentation

## 2017-07-13 DIAGNOSIS — E669 Obesity, unspecified: Secondary | ICD-10-CM | POA: Diagnosis not present

## 2017-07-13 MED ORDER — CLOPIDOGREL BISULFATE 75 MG PO TABS: 75.0000 mg | ORAL_TABLET | Freq: Every day | ORAL | 3 refills | Status: DC

## 2017-07-13 NOTE — Patient Instructions (Signed)
Echocardiogram and Follow up in 6 months.  Please call our clinic in October to schedule your follow up. [phone], Option 3.

## 2017-07-13 NOTE — Progress Notes (Signed)
Patient ID: Heather [name], female   DOB: [birth year], 72 y.o.   MRN: [id number] PCP: Dr. Loanne Drilling Cardiology: Dr. Wyline Beady is a 72 y.o. female with CAD by coronary calcium on CT chest, OSA on Bipap, type II diabetes, and HTN presents for cardiology evaluation.  Her husband Heather [name]) is a patient of mine as well.  She had a workup for exertional dyspnea in 6/14 by Dr Chase Caller.  PFTs were restrictive with normal DLCO, suggesting abnormality was from body habitus.  CT chest showed coronary calcification.  She had an echo showed normal EF and a Lexiscan Cardiolite in 8/14 that was normal.    Today she returns for regular follow up. Last seen 04/2016. She had bunionectomy and hammertoe repair on 06/04/17. Overall, she is doing great. She lost 50 lbs in the last year through dieting with [facility]. No CP or SOB. Denies orthopnea/PND/edema. Wears CPAP every night. Compliant with medications.   Echo 05/08/16: EF 65-70%, grade 1 DD, PA peak pressure 43 mmHg  Labs (12/15): LDL 64, HDL 50 Labs (6/16): TSH normal, HCT 39.1, K 3.4, creatinine 0.91 Labs (1/18): K 3.5, creatinine 0.87, LDL 78, HDL 65 Labs (10/18): K 3.6, creatinine 0.74, LDL 81, HDL 81  PMH: 1. Obese 2. OSA: On oxygen and Bipap at night.  3. Migraines 4. GERD with large hiatal hernia 5. Type II diabetes. 6. PFTs in 6/14 with evidence for restriction but normal DLCO.  Suspect related to body habitus.  7. CAD: Coronary calcium on CT chest in 6/14.  Lexiscan Cardiolite in 8/14 with EF 51%, no ischemia or infarction.  Echo (8/14) with EF 60%, mild LVH.  8. Hyperlipidemia 9. HTN 10. Uterine fibroids 11. Osteoarthritis 12. Aspirin allergy: Hives.   SH: Married, quit smoking in 1980s, 2 children  FH: No premature CAD.  Review of systems complete and found to be negative unless listed in HPI.   Current Outpatient Medications  Medication Sig Dispense Refill  . acetaminophen (TYLENOL) 500 MG tablet  Take 650 mg by mouth 3 (three) times daily as needed for pain.     [name] ALLEGRA-D ALLERGY & CONGESTION 180-240 MG 24 hr tablet TAKE 1 TABLET BY MOUTH DAILY 30 tablet 0  . Calcium Carbonate-Vitamin D (CALCIUM 600 + D PO) Take 2 tablets by mouth daily.      . clindamycin (CLEOCIN) 150 MG capsule Take 1 capsule (150 mg total) by mouth 3 (three) times daily. 30 capsule 0  . clopidogrel (PLAVIX) 75 MG tablet Take 1 tablet (75 mg total) by mouth daily. Needs office visit 30 tablet 0  . Ferrous Sulfate (IRON) 325 (65 FE) MG TABS Take 1 tablet by mouth daily.      . furosemide (LASIX) 40 MG tablet TAKE 1 TABLET EVERY DAY 90 tablet 3  . gabapentin (NEURONTIN) 100 MG capsule TAKE 2 CAPSULES(200 MG) BY MOUTH AT BEDTIME 180 capsule 1  . glucosamine-chondroitin 500-400 MG tablet Take 1 tablet by mouth 2 (two) times daily.      [name] KLOR-CON M20 20 MEQ tablet TAKE 1 TABLET EVERY DAY 90 tablet 3  . metFORMIN (GLUCOPHAGE-XR) 500 MG 24 hr tablet TAKE 2 TABLETS TWICE DAILY 360 tablet 3  . Multiple Vitamin (MULTIVITAMIN) tablet Take 1 tablet by mouth daily.      . naproxen sodium (ANAPROX) 220 MG tablet Take 220 mg by mouth as needed.    . Omega-3 Fatty Acids (FISH OIL) 1000 MG CAPS Take 1 capsule  by mouth daily.    [name] omeprazole (PRILOSEC) 20 MG capsule Take 20 mg by mouth daily as needed.     . ondansetron (ZOFRAN) 4 MG tablet Take 1 tablet (4 mg total) by mouth every 8 (eight) hours as needed for nausea or vomiting. 20 tablet 0  . Probiotic Product (PROBIOTIC-10) CAPS Take 1 capsule by mouth daily.    . pseudoephedrine-acetaminophen (TYLENOL SINUS) 30-500 MG TABS tablet Take 1 tablet by mouth as needed.    . pyridOXINE (VITAMIN B-6) 100 MG tablet Take 200 mg by mouth daily.    . rosuvastatin (CRESTOR) 40 MG tablet TAKE 1 TABLET EVERY DAY 90 tablet 3  . SF 5000 PLUS 1.1 % CREA dental cream   0  . topiramate (TOPAMAX) 100 MG tablet TAKE 1 TABLET EVERY DAY 90 tablet 3  . Turmeric 500 MG TABS Take 2 tablets by mouth  daily.    [name] venlafaxine XR (EFFEXOR-XR) 37.5 MG 24 hr capsule Take 1 capsule (37.5 mg total) by mouth daily with breakfast. 90 capsule 1  . vitamin E 200 UNIT capsule Take 200 Units by mouth daily.     Current Facility-Administered Medications  Medication Dose Route Frequency Provider Last Rate Last Dose  . 0.9 %  sodium chloride infusion  500 mL Intravenous Continuous Harl Bowie V, MD       Vitals:   07/13/17 1407  BP: 112/78  Pulse: 84  SpO2: 98%  Weight: 220 lb 6.4 oz (100 kg)    Wt Readings from Last 3 Encounters:  07/13/17 220 lb 6.4 oz (100 kg)  07/01/17 219 lb (99.3 kg)  06/01/17 218 lb (98.9 kg)   General: Obese. No respiratory distress HEENT: Normal Neck: Supple. JVP flat. Carotids 2+ bilat; no bruits. No thyromegaly or nodule noted. Cor: PMI nondisplaced. RRR, No M/G/R noted Lungs: CTAB, normal effort. Abdomen: Soft, non-tender, non-distended, no HSM. No bruits or masses. +BS  Extremities: No cyanosis, clubbing, or rash. R and LLE trace ankle edema Neuro: Alert & orientedx3, cranial nerves grossly intact. moves all 4 extremities w/o difficulty. Affect pleasant  Assessment/Plan: 1. CAD: Patient has coronary disease based on calcium seen on chest CT in the coronaries in 6/14.  She had a Lexiscan Cardiolite in 8/14 that was normal.  Discussed CAD on CT from 2014 today. - No s/s ischemia - Continue Plavix 75 daily (ASA allergy).  - Continue statin 2. Hyperlipidemia:  - Good lipids 10/18 on Crestor.  3. HTN: - Well controlled. No changes.  4. Obesity:  - Body mass index is 37.83 kg/m.  - Following with [facility] and has lost 50 lbs with dieting 5. Mild diastolic HF - Echo 1/53: EF 65-70%, grade 1 DD, PA peak pressure 43 mmHg - Continue 40 mg lasix daily - Repeat echo 6 months 6. OSA - Wearing CPAP every night.    Follow up in 6 months with Dr Aundra Dubin with Echo   Georgiana Shore 07/13/2017  Greater than 50% of the 25 minute visit  was spent in counseling/coordination of care regarding disease state education, salt/fluid restriction, sliding scale diuretics, and medication compliance.

## 2017-07-22 ENCOUNTER — Ambulatory Visit (INDEPENDENT_AMBULATORY_CARE_PROVIDER_SITE_OTHER): Payer: Medicare Other | Admitting: Podiatry

## 2017-07-22 ENCOUNTER — Encounter: Payer: Self-pay | Admitting: Podiatry

## 2017-07-22 ENCOUNTER — Ambulatory Visit (INDEPENDENT_AMBULATORY_CARE_PROVIDER_SITE_OTHER): Payer: Medicare Other

## 2017-07-22 DIAGNOSIS — M2041 Other hammer toe(s) (acquired), right foot: Secondary | ICD-10-CM

## 2017-07-22 DIAGNOSIS — M2011 Hallux valgus (acquired), right foot: Secondary | ICD-10-CM

## 2017-07-22 NOTE — Progress Notes (Signed)
She presents today with her husband she is here for a follow-up visit postop visit date of surgeryApril 5, 2019 [place] bunionectomy right foot hammertoe repair with screws second digit right foot.  She states that has not been doing too good.  She denies any trauma.  Objective: Vital signs are stable she is alert and oriented x2 hallux is rectus second toe is rectus.  Sutures remain in the second toe which were removed today margins remain well coapted she has great range of motion of the first metatarsal phalangeal joint as well as the second MTPJ.  There is minimal edema no erythema saline strange odor no dehiscence no opening of the wound.  Radiographs demonstrate well healing capital osteotomy to the first metatarsal as well as internal fixation and screw fixation to the second toe with fusion.  Appears to be hearing healing very nicely.  Assessment: Well-healing surgical foot.  Plan: Menarche back in her regular shoe gear and I will follow-up with her in about 3 weeks for another set of x-rays.

## 2017-07-29 ENCOUNTER — Ambulatory Visit (INDEPENDENT_AMBULATORY_CARE_PROVIDER_SITE_OTHER): Payer: Medicare Other | Admitting: Family Medicine

## 2017-07-29 VITALS — BP 99/65 | HR 61 | Temp 97.7°F | Ht 64.0 in | Wt 212.0 lb

## 2017-07-29 DIAGNOSIS — E7849 Other hyperlipidemia: Secondary | ICD-10-CM | POA: Diagnosis not present

## 2017-07-29 DIAGNOSIS — Z6836 Body mass index (BMI) 36.0-36.9, adult: Secondary | ICD-10-CM | POA: Diagnosis not present

## 2017-07-29 DIAGNOSIS — E559 Vitamin D deficiency, unspecified: Secondary | ICD-10-CM

## 2017-07-29 DIAGNOSIS — I9589 Other hypotension: Secondary | ICD-10-CM | POA: Diagnosis not present

## 2017-07-29 DIAGNOSIS — E119 Type 2 diabetes mellitus without complications: Secondary | ICD-10-CM | POA: Diagnosis not present

## 2017-07-30 LAB — LIPID PANEL WITH LDL/HDL RATIO
Cholesterol, Total: 195 mg/dL (ref 100–199)
HDL: 72 mg/dL (ref >39)
LDL Calculated: 109 mg/dL — ABNORMAL HIGH (ref 0–99)
LDL/HDL RATIO: 1.5 ratio (ref 0.0–3.2)
TRIGLYCERIDES: 68 mg/dL (ref 0–149)
VLDL Cholesterol Cal: 14 mg/dL (ref 5–40)

## 2017-07-30 LAB — COMPREHENSIVE METABOLIC PANEL
ALK PHOS: 71 IU/L (ref 39–117)
ALT: 10 IU/L (ref 0–32)
AST: 15 IU/L (ref 0–40)
Albumin/Globulin Ratio: 1.3 (ref 1.2–2.2)
Albumin: 4.3 g/dL (ref 3.5–4.8)
BUN / CREAT RATIO: 22 (ref 12–28)
BUN: 17 mg/dL (ref 8–27)
Bilirubin Total: <0.2 mg/dL (ref 0.0–1.2)
CO2: 24 mmol/L (ref 20–29)
CREATININE: 0.78 mg/dL (ref 0.57–1.00)
Calcium: 9.5 mg/dL (ref 8.7–10.3)
Chloride: 102 mmol/L (ref 96–106)
GFR calc Af Amer: 89 mL/min/{1.73_m2} (ref >59)
GFR, EST NON AFRICAN AMERICAN: 77 mL/min/{1.73_m2} (ref >59)
Globulin, Total: 3.2 g/dL (ref 1.5–4.5)
Glucose: 90 mg/dL (ref 65–99)
Potassium: 3.8 mmol/L (ref 3.5–5.2)
SODIUM: 140 mmol/L (ref 134–144)
Total Protein: 7.5 g/dL (ref 6.0–8.5)

## 2017-07-30 LAB — INSULIN, RANDOM: INSULIN: 6.8 u[IU]/mL (ref 2.6–24.9)

## 2017-07-30 LAB — HEMOGLOBIN A1C
Est. average glucose Bld gHb Est-mCnc: 117 mg/dL
HEMOGLOBIN A1C: 5.7 % — AB (ref 4.8–5.6)

## 2017-07-30 LAB — VITAMIN D 25 HYDROXY (VIT D DEFICIENCY, FRACTURES): Vit D, 25-Hydroxy: 40.7 ng/mL (ref 30.0–100.0)

## 2017-07-30 NOTE — Progress Notes (Signed)
Office: [phone]  /  Fax: [phone]   HPI:   Chief Complaint: OBESITY Heather [name] is here to discuss her progress with her obesity treatment plan. She is on the  follow the Category 2 plan and is following her eating plan approximately 50 % of the time. She states she is exercising 0 minutes 0 times per week. Heather [name] is doing well with weight loss. She hasn't been following her meal plan but doing well with portion control and making smart choices. She had foot surgery and concentrating on healing that.  Her weight is 212 lb (96.2 kg) today and has had a weight loss of 7 pounds over a period of 4 weeks since her last visit. She has lost 18 lbs since starting treatment with [name].  Hypotension Patient currently on Lasix, she denies lightheadedness or dizziness. She has not been drinking as much water recently.   Diabetes II Anthonia has a diagnosis of diabetes type II. Heather [name] states patient does not check sugars and denies any hypoglycemic episodes. Last A1c was Hemoglobin A1C Latest Ref Rng & Units 07/29/2017 03/15/2017 12/23/2016 09/08/2016  HGBA1C 4.8 - 5.6 % 5.7(H) 5.9 6.3(H) 6.3  Some recent data might be hidden    She has been working on intensive lifestyle modifications including diet, exercise, and weight loss to help control her blood glucose levels.  Vitamin D deficiency Heather [name] has a diagnosis of vitamin D deficiency. She is currently taking OTC vit D , Multivitamin, Calcium + Vit Dand denies nausea, vomiting or muscle weakness. Heather [name] fatigue has improved.   Hyperlipidemia Heather [name] has hyperlipidemia and has been trying to improve her cholesterol levels with intensive lifestyle modification including a low saturated fat diet, exercise and weight loss. She denies any chest pain, claudication or myalgias. Heather [name] is currently on Crestor and Fish oil.   ALLERGIES: Allergies  Allergen Reactions  . Aspirin   . Coconut Oil   . Latex   . Lisinopril     REACTION: cough  . Metoprolol Itching    . Peanut-Containing Drug Products   . Penicillins     REACTION: rash  . Prednisone   . Strawberry Extract     MEDICATIONS: Current Outpatient Medications on File Prior to Visit  Medication Sig Dispense Refill  . acetaminophen (TYLENOL) 500 MG tablet Take 650 mg by mouth 3 (three) times daily as needed for pain.     [name] ALLEGRA-D ALLERGY & CONGESTION 180-240 MG 24 hr tablet TAKE 1 TABLET BY MOUTH DAILY 30 tablet 0  . Calcium Carbonate-Vitamin D (CALCIUM 600 + D PO) Take 2 tablets by mouth daily.      . clopidogrel (PLAVIX) 75 MG tablet Take 1 tablet (75 mg total) by mouth daily. 90 tablet 3  . Ferrous Sulfate (IRON) 325 (65 FE) MG TABS Take 1 tablet by mouth daily.      . furosemide (LASIX) 40 MG tablet TAKE 1 TABLET EVERY DAY 90 tablet 3  . gabapentin (NEURONTIN) 100 MG capsule TAKE 2 CAPSULES(200 MG) BY MOUTH AT BEDTIME 180 capsule 1  . glucosamine-chondroitin 500-400 MG tablet Take 1 tablet by mouth 2 (two) times daily.      [name] KLOR-CON M20 20 MEQ tablet TAKE 1 TABLET EVERY DAY 90 tablet 3  . metFORMIN (GLUCOPHAGE-XR) 500 MG 24 hr tablet TAKE 2 TABLETS TWICE DAILY 360 tablet 3  . Multiple Vitamin (MULTIVITAMIN) tablet Take 1 tablet by mouth daily.      . naproxen sodium (ANAPROX) 220 MG tablet Take 220 mg  by mouth as needed.    . Omega-3 Fatty Acids (FISH OIL) 1000 MG CAPS Take 1 capsule by mouth daily.    [name] omeprazole (PRILOSEC) 20 MG capsule Take 20 mg by mouth daily as needed.     . Probiotic Product (PROBIOTIC-10) CAPS Take 1 capsule by mouth daily.    . pseudoephedrine-acetaminophen (TYLENOL SINUS) 30-500 MG TABS tablet Take 1 tablet by mouth as needed.    . pyridOXINE (VITAMIN B-6) 100 MG tablet Take 200 mg by mouth daily.    . rosuvastatin (CRESTOR) 40 MG tablet TAKE 1 TABLET EVERY DAY 90 tablet 3  . SF 5000 PLUS 1.1 % CREA dental cream   0  . topiramate (TOPAMAX) 100 MG tablet TAKE 1 TABLET EVERY DAY 90 tablet 3  . Turmeric 500 MG TABS Take 2 tablets by mouth daily.    [name]  venlafaxine XR (EFFEXOR-XR) 37.5 MG 24 hr capsule Take 1 capsule (37.5 mg total) by mouth daily with breakfast. 90 capsule 1  . vitamin E 200 UNIT capsule Take 200 Units by mouth daily.     Current Facility-Administered Medications on File Prior to Visit  Medication Dose Route Frequency Provider Last Rate Last Dose  . 0.9 %  sodium chloride infusion  500 mL Intravenous Continuous Nandigam, Venia Minks, MD        PAST MEDICAL HISTORY: Past Medical History:  Diagnosis Date  . Abnormal Pap smear   . ALLERGIC RHINITIS 10/22/2006  . ANEMIA 12/18/2008  . Arthritis   . ASYMPTOMATIC POSTMENOPAUSAL STATUS 11/22/2007  . Back pain   . Complication of anesthesia   . Degenerative arthritis   . DIABETES MELLITUS, TYPE II 01/13/2007  . Dyslipidemia   . Fibroid   . GERD 07/20/2007  . GOITER, MULTINODULAR 07/20/2007  . Headache(784.0) 07/20/2007  . HEARING LOSS 11/22/2007  . Hiatal hernia   . Hx of colposcopy with cervical biopsy   . HYPERCHOLESTEROLEMIA 01/13/2007  . Hyperglycemia   . HYPERTENSION 10/22/2006  . NASH (nonalcoholic steatohepatitis)   . Nocturnal hypoxemia 02/17/2013  . Obesity   . Obstructive sleep apnea   . OSTEOARTHRITIS 10/22/2006  . Other chronic nonalcoholic liver disease 07/08/3265  . Sleep apnea     PAST SURGICAL HISTORY: Past Surgical History:  Procedure Laterality Date  . BREAST SURGERY     Breast reduction  . CATARACT EXTRACTION, BILATERAL    . DEXA  08/2005  . DILATION AND CURETTAGE OF UTERUS    . ELECTROCARDIOGRAM  10/15/2006  . ESOPHAGOGASTRODUODENOSCOPY  12/08/2005  . Stress Cardiolite  10/21/2005  . sweat gland removal    . WISDOM TOOTH EXTRACTION      SOCIAL HISTORY: Social History   Tobacco Use  . Smoking status: Former Smoker    Packs/day: 2.00    Years: 20.00    Pack years: 40.00    Types: Cigarettes    Last attempt to quit: 03/03/1979    Years since quitting: 38.4  . Smokeless tobacco: Never Used  Substance Use Topics  . Alcohol use: Yes     Comment: rare  . Drug use: No    FAMILY HISTORY: Family History  Problem Relation Age of Onset  . Asthma Mother   . Depression Mother   . Bipolar disorder Mother   . Dementia Mother   . Arthritis Mother   . Breast cancer Mother        primary  . Colon cancer Mother        mets from breast  .  Cancer Mother   . Hypertension Father   . Migraines Father   . Cancer Maternal Aunt   . Heart disease Maternal Grandmother   . Cancer Maternal Grandfather     ROS: Review of Systems  Constitutional: Positive for weight loss.  All other systems reviewed and are negative.   PHYSICAL EXAM: Blood pressure 99/65, pulse 61, temperature 97.7 F (36.5 C), temperature source Oral, height 5' 4"  (1.626 m), weight 212 lb (96.2 kg), SpO2 (!) 86 %. Body mass index is 36.39 kg/m. Physical Exam  Constitutional: She is oriented to person, place, and time. She appears well-developed.  HENT:  Head: Normocephalic.  Eyes: EOM are normal.  Neck: Normal range of motion.  Pulmonary/Chest: Effort normal.  Musculoskeletal: Normal range of motion.  Neurological: She is alert and oriented to person, place, and time.  Skin: Skin is warm and dry.  Vitals reviewed.   RECENT LABS AND TESTS: BMET    Component Value Date/Time   NA 140 07/29/2017 1040   K 3.8 07/29/2017 1040   CL 102 07/29/2017 1040   CO2 24 07/29/2017 1040   GLUCOSE 90 07/29/2017 1040   GLUCOSE 116 (H) 03/05/2016 0958   BUN 17 07/29/2017 1040   CREATININE 0.78 07/29/2017 1040   CREATININE 0.81 12/28/2011 0853   CALCIUM 9.5 07/29/2017 1040   GFRNONAA 77 07/29/2017 1040   GFRAA 89 07/29/2017 1040   Lab Results  Component Value Date   HGBA1C 5.7 (H) 07/29/2017   HGBA1C 5.9 03/15/2017   HGBA1C 6.3 (H) 12/23/2016   HGBA1C 6.3 09/08/2016   HGBA1C 6.4 03/05/2016   Lab Results  Component Value Date   INSULIN 6.8 07/29/2017   INSULIN 20.9 12/23/2016   CBC    Component Value Date/Time   WBC 4.9 12/23/2016 0956   WBC 7.2  03/05/2016 0958   RBC 4.32 12/23/2016 0956   RBC 4.46 03/05/2016 0958   HGB 12.1 12/23/2016 0956   HCT 37.4 12/23/2016 0956   PLT 300.0 03/05/2016 0958   MCV 87 12/23/2016 0956   MCH 28.0 12/23/2016 0956   MCH 29.4 12/28/2011 0853   MCHC 32.4 12/23/2016 0956   MCHC 32.9 03/05/2016 0958   RDW 17.5 (H) 12/23/2016 0956   LYMPHSABS 1.2 12/23/2016 0956   MONOABS 0.4 03/05/2016 0958   EOSABS 0.1 12/23/2016 0956   BASOSABS 0.0 12/23/2016 0956   Iron/TIBC/Ferritin/ %Sat    Component Value Date/Time   IRON 38 12/23/2016 0956   TIBC 271 12/23/2016 0956   FERRITIN 40 12/23/2016 0956   IRONPCTSAT 14 (L) 12/23/2016 0956   Lipid Panel     Component Value Date/Time   CHOL 195 07/29/2017 1040   TRIG 68 07/29/2017 1040   HDL 72 07/29/2017 1040   CHOLHDL 3 03/05/2016 0958   VLDL 26.0 03/05/2016 0958   LDLCALC 109 (H) 07/29/2017 1040   Hepatic Function Panel     Component Value Date/Time   PROT 7.5 07/29/2017 1040   ALBUMIN 4.3 07/29/2017 1040   AST 15 07/29/2017 1040   ALT 10 07/29/2017 1040   ALKPHOS 71 07/29/2017 1040   BILITOT <0.2 07/29/2017 1040   BILIDIR 0.1 02/15/2015 1010   IBILI 0.2 12/28/2011 0853      Component Value Date/Time   TSH 1.170 12/23/2016 0956   TSH 1.76 03/05/2016 0958   TSH 1.86 02/15/2015 1010    ASSESSMENT AND PLAN: Other specified hypotension  Type 2 diabetes mellitus without complication, without long-term current use of insulin ([place]) - Plan: Comprehensive  metabolic panel, Hemoglobin A1c, Insulin, random  Vitamin D deficiency - Plan: VITAMIN D 25 Hydroxy (Vit-D Deficiency, Fractures)  Other hyperlipidemia - Plan: Lipid Panel With LDL/HDL Ratio  Class 2 severe obesity with serious comorbidity and body mass index (BMI) of 36.0 to 36.9 in adult, unspecified obesity type ([place])  PLAN:  Hypotension Advised January to increase her water intake and will recheck her blood pressure in 3 weeks.   Diabetes II Heather [name] has been given extensive diabetes  education by myself today including ideal fasting and post-prandial blood glucose readings, individual ideal HgA1c goals  and hypoglycemia prevention. We discussed the importance of good blood sugar control to decrease the likelihood of diabetic complications such as nephropathy, neuropathy, limb loss, blindness, coronary artery disease, and death. We discussed the importance of intensive lifestyle modification including diet, exercise and weight loss as the first line treatment for diabetes. Heather [name] agrees to continue her diabetes medications and will follow up at the agreed upon time. Will check labs today and follow up.   Vitamin D Deficiency Heather [name] was informed that low vitamin D levels contributes to fatigue and are associated with obesity, breast, and colon cancer. She agrees to continue to take prescription Vit D @50 ,000 IU every week and will follow up for routine testing of vitamin D, at least 2-3 times per year. She was informed of the risk of over-replacement of vitamin D and agrees to not increase her dose unless she discusses this with [name] first. Will check labs today and follow up.   Hyperlipidemia Heather [name] was informed of the American Heart Association Guidelines emphasizing intensive lifestyle modifications as the first line treatment for hyperlipidemia. We discussed many lifestyle modifications today in depth, and Heather [name] will continue to work on decreasing saturated fats such as fatty red meat, butter and many fried foods. She will also increase vegetables and lean protein in her diet and continue to work on exercise and weight loss efforts. Will check labs today and follow up.   Obesity Heather [name] is currently in the action stage of change. As such, her goal is to continue with weight loss efforts She has agreed to follow the Category 2 plan Heather [name] has been instructed to work up to a goal of 150 minutes of combined cardio and strengthening exercise per week for weight loss and overall health  benefits. We discussed the following Behavioral Modification Stratagies today: increasing lean protein intake and increasing her water intake.   Heather [name] has agreed to follow up with our clinic in 3 weeks. She was informed of the importance of frequent follow up visits to maximize her success with intensive lifestyle modifications for her multiple health conditions.   OBESITY BEHAVIORAL INTERVENTION VISIT  Today's visit was # 13 out of 22.  Starting weight: 268lb Starting date: 12/23/16 Today's weight : Weight: 212 lb (96.2 kg)  Today's date: 07/30/2017 Total lbs lost to date: 7 (Patients must lose 7 lbs in the first 6 months to continue with counseling)   ASK: We discussed the diagnosis of obesity with Heather [name] today and Heather [name] agreed to give [name] permission to discuss obesity behavioral modification therapy today.  ASSESS: Heather [name] has the diagnosis of obesity and her BMI today is @TBMI @ [place] is in the action stage of change   ADVISE: Heather [name] was educated on the multiple health risks of obesity as well as the benefit of weight loss to improve her health. She was advised of the need for long term treatment and the importance of lifestyle  modifications.  AGREE: Multiple dietary modification options and treatment options were discussed and  Heather [name] agreed to the above obesity treatment plan.  I, April Moore, am acting as Location manager for [name], MD  I have reviewed the above documentation for accuracy and completeness, and I agree with the above. -[name], MD

## 2017-08-03 ENCOUNTER — Ambulatory Visit (INDEPENDENT_AMBULATORY_CARE_PROVIDER_SITE_OTHER): Payer: Medicare Other | Admitting: Family Medicine

## 2017-08-03 ENCOUNTER — Encounter: Payer: Self-pay | Admitting: Family Medicine

## 2017-08-03 DIAGNOSIS — I2510 Atherosclerotic heart disease of native coronary artery without angina pectoris: Secondary | ICD-10-CM | POA: Diagnosis not present

## 2017-08-03 DIAGNOSIS — M5136 Other intervertebral disc degeneration, lumbar region: Secondary | ICD-10-CM | POA: Diagnosis not present

## 2017-08-03 DIAGNOSIS — M170 Bilateral primary osteoarthritis of knee: Secondary | ICD-10-CM | POA: Diagnosis not present

## 2017-08-03 NOTE — Assessment & Plan Note (Signed)
Severe overall.  Has been doing very well since last Visco supplementation in November.  No change in management at this moment.  Has topical anti-inflammatories for any type of worsening symptoms.  Icing regimen.  Follow-up in 3 months

## 2017-08-03 NOTE — Progress Notes (Signed)
[name] [place],  [address] Phone: [phone] Subjective:    I'm seeing this patient by the request  of:    CC: Back pain and bilateral knee pain follow-up  HGD:[id number]  Heather [name] is a 72 y.o. female coming in with complaint of  Back pain.  Patient has had some difficulty in the past.  Seems to be more muscle instability.  Moderate to severe degenerative disc disease is known as well as facet arthritis.  Independently visualized by me with x-rays from January 2017.  Patient is having some increasing discomfort.  Not doing the exercises as regularly.  Patient did have foot surgery and has not been as active and thinks that is contributing to some of the discomfort and pain.  Bilateral knee pain.  Does have known severe arthritic changes.  Mild increase instability mild increase in swelling.      Past Medical History:  Diagnosis Date  . Abnormal Pap smear   . ALLERGIC RHINITIS 10/22/2006  . ANEMIA 12/18/2008  . Arthritis   . ASYMPTOMATIC POSTMENOPAUSAL STATUS 11/22/2007  . Back pain   . Complication of anesthesia   . Degenerative arthritis   . DIABETES MELLITUS, TYPE II 01/13/2007  . Dyslipidemia   . Fibroid   . GERD 07/20/2007  . GOITER, MULTINODULAR 07/20/2007  . Headache(784.0) 07/20/2007  . HEARING LOSS 11/22/2007  . Hiatal hernia   . Hx of colposcopy with cervical biopsy   . HYPERCHOLESTEROLEMIA 01/13/2007  . Hyperglycemia   . HYPERTENSION 10/22/2006  . NASH (nonalcoholic steatohepatitis)   . Nocturnal hypoxemia 02/17/2013  . Obesity   . Obstructive sleep apnea   . OSTEOARTHRITIS 10/22/2006  . Other chronic nonalcoholic liver disease 08/21/2977  . Sleep apnea    Past Surgical History:  Procedure Laterality Date  . BREAST SURGERY     Breast reduction  . CATARACT EXTRACTION, BILATERAL    . DEXA  08/2005  . DILATION AND CURETTAGE OF UTERUS    . ELECTROCARDIOGRAM  10/15/2006  . ESOPHAGOGASTRODUODENOSCOPY   12/08/2005  . Stress Cardiolite  10/21/2005  . sweat gland removal    . WISDOM TOOTH EXTRACTION     Social History   Socioeconomic History  . Marital status: Married    Spouse name: [name]  . Number of children: 2  . Years of education: College  . Highest education level: Not on file  Occupational History  . Occupation: Retired  Scientific laboratory technician  . Financial resource strain: Not on file  . Food insecurity:    Worry: Not on file    Inability: Not on file  . Transportation needs:    Medical: Not on file    Non-medical: Not on file  Tobacco Use  . Smoking status: Former Smoker    Packs/day: 2.00    Years: 20.00    Pack years: 40.00    Types: Cigarettes    Last attempt to quit: 03/03/1979    Years since quitting: 38.4  . Smokeless tobacco: Never Used  Substance and Sexual Activity  . Alcohol use: Yes    Comment: rare  . Drug use: No  . Sexual activity: Yes    Birth control/protection: Surgical  Lifestyle  . Physical activity:    Days per week: Not on file    Minutes per session: Not on file  . Stress: Not on file  Relationships  . Social connections:    Talks on phone: Not on file    Gets  together: Not on file    Attends religious service: Not on file    Active member of club or organization: Not on file    Attends meetings of clubs or organizations: Not on file    Relationship status: Not on file  Other Topics Concern  . Not on file  Social History Narrative   Patient is married [name]).   Patient has two children.   Patient is retired.   Patient has a college education.   Patient is right-handed.   Patient lives at home with family.   Caffeine Use: 2 soda every other day   Allergies  Allergen Reactions  . Aspirin   . Coconut Oil   . Latex   . Lisinopril     REACTION: cough  . Metoprolol Itching  . Peanut-Containing Drug Products   . Penicillins     REACTION: rash  . Prednisone   . Strawberry Extract    Family History  Problem Relation Age of  Onset  . Asthma Mother   . Depression Mother   . Bipolar disorder Mother   . Dementia Mother   . Arthritis Mother   . Breast cancer Mother        primary  . Colon cancer Mother        mets from breast  . Cancer Mother   . Hypertension Father   . Migraines Father   . Cancer Maternal Aunt   . Heart disease Maternal Grandmother   . Cancer Maternal Grandfather      Past medical history, social, surgical and family history all reviewed in electronic medical record.  No pertanent information unless stated regarding to the chief complaint.   Review of Systems:Review of systems updated and as accurate as of 08/03/17  No headache, visual changes, nausea, vomiting, diarrhea, constipation, dizziness, abdominal pain, skin rash, fevers, chills, night sweats, weight loss, swollen lymph nodes, body aches, joint swelling, chest pain, shortness of breath, mood changes.  Positive muscle aches,   Objective  Blood pressure 116/72, pulse 79, height 5' 4"  (1.626 m), weight 215 lb (97.5 kg), SpO2 92 %. Systems examined below as of 08/03/17   General: No apparent distress alert and oriented x3 mood and affect normal, dressed appropriately.  HEENT: Pupils equal, extraocular movements intact  Respiratory: Patient's speak in full sentences and does not appear short of breath  Cardiovascular: No lower extremity edema, non tender, no erythema  Skin: Warm dry intact with no signs of infection or rash on extremities or on axial skeleton.  Abdomen: Soft nontender  Neuro: Cranial nerves II through XII are intact, neurovascularly intact in all extremities with 2+ DTRs and 2+ pulses.  Lymph: No lymphadenopathy of posterior or anterior cervical chain or axillae bilaterally.  Gait antalgic MSK:  Non tender with full range of motion and good stability and symmetric strength and tone of shoulders, elbows, wrist, hip, and ankles bilaterally.  Arthritic changes of multiple joints Knee: Bilateral valgus deformity  noted. Large thigh to calf ratio.  Tender to palpation over medial and PF joint line.  ROM full in flexion and extension and lower leg rotation. instability with valgus force.  painful patellar compression. Patellar glide with moderate crepitus. Patellar and quadriceps tendons unremarkable. Hamstring and quadriceps strength is normal.   Back exam shows degenerative scoliosis with loss of lordosis Tightness of the hamstrings but negative straight leg test.  Patient unable to do [facility] test secondary to stiffness and tightness and pain.  Diffuse tenderness in the paraspinal  musculature       Impression and Recommendations:     This case required medical decision making of moderate complexity.      Note: This dictation was prepared with Dragon dictation along with smaller phrase technology. Any transcriptional errors that result from this process are unintentional.

## 2017-08-03 NOTE — Assessment & Plan Note (Signed)
Known arthritic changes.  Some of these symptoms seem to be more consistent with a spinal stenosis.  Discussed the possibility of advanced imaging and epidurals if needed.  Patient wants to continue with conservative therapy.  Once patient is cleared since her surgery on her foot we will then have patient start physical therapy for short course and then increase activity on her own.  Follow-up with me again in 3 months

## 2017-08-03 NOTE — Patient Instructions (Signed)
Good to see you  Overall you are doing great  Write me on what the surgeon discusses about PT and I can add on your knees and back  Try the Effexor regularly for awhile and lets see if that helps Keep it up I am proud of you  See me again in 3-4 months

## 2017-08-10 ENCOUNTER — Other Ambulatory Visit: Payer: Self-pay | Admitting: Endocrinology

## 2017-08-16 ENCOUNTER — Ambulatory Visit (INDEPENDENT_AMBULATORY_CARE_PROVIDER_SITE_OTHER): Payer: Medicare Other | Admitting: Family Medicine

## 2017-08-16 VITALS — BP 88/58 | HR 77 | Ht 64.0 in | Wt 212.0 lb

## 2017-08-16 DIAGNOSIS — Z6836 Body mass index (BMI) 36.0-36.9, adult: Secondary | ICD-10-CM | POA: Diagnosis not present

## 2017-08-16 DIAGNOSIS — I9589 Other hypotension: Secondary | ICD-10-CM | POA: Diagnosis not present

## 2017-08-17 ENCOUNTER — Ambulatory Visit (INDEPENDENT_AMBULATORY_CARE_PROVIDER_SITE_OTHER): Payer: Medicare Other

## 2017-08-17 ENCOUNTER — Ambulatory Visit (INDEPENDENT_AMBULATORY_CARE_PROVIDER_SITE_OTHER): Payer: Medicare Other | Admitting: Podiatry

## 2017-08-17 ENCOUNTER — Encounter: Payer: Self-pay | Admitting: Podiatry

## 2017-08-17 DIAGNOSIS — M2011 Hallux valgus (acquired), right foot: Secondary | ICD-10-CM

## 2017-08-17 DIAGNOSIS — M2041 Other hammer toe(s) (acquired), right foot: Secondary | ICD-10-CM

## 2017-08-17 DIAGNOSIS — Z9889 Other specified postprocedural states: Secondary | ICD-10-CM

## 2017-08-17 NOTE — Progress Notes (Signed)
She presents today for follow-up of her [name] bunion repair right foot and hammertoe repair right foot.  Date of surgery 405 2019.  She states that she is doing better but she has considerable pain to the tips of her toes right.  Objective: Vital signs are stable she is alert and oriented x3.  Pulses are palpable.  She has great range of motion of the first metatarsophalangeal joint edema has gone down considerably tenderness on the palpation of the toe distally.  Radiographs taken today demonstrate well healing osteotomies and arthrodesis sites.  Plan: We will follow-up with her in 1 month at this point I will send her to physical therapy increased range of motion of the first metatarsophalangeal joint and decrease edema.

## 2017-08-17 NOTE — Progress Notes (Signed)
Office: [phone]  /  Fax: [phone]   HPI:   Chief Complaint: OBESITY Heather [name] is here to discuss her progress with her obesity treatment plan. She is on the Category 2 plan and is following her eating plan approximately 40 % of the time. She states she is exercising 30 minutes 1 to 2 times per week. Heather [name] states she is not following her category 2 plan closely. She notes increased celebration eating and increased social eating, but she did well maintaining weight. She is back to skipping breakfast. Her weight is 212 lb (96.2 kg) today and has maintained weight over a period of 2 weeks since her last visit. She has lost 56 lbs since starting treatment with [name].  Hypotension Heather [name] is a 72 y.o. female with hypotension. She is working on increasing her water intake and she denies feeling lightheaded or dizzy. Heather [name] denies palpitations. She is working weight loss to help control her blood pressure with the goal of decreasing her risk of heart attack and stroke. Heather [name] blood pressure is not currently controlled.  ALLERGIES: Allergies  Allergen Reactions  . Aspirin   . Coconut Oil   . Latex   . Lisinopril     REACTION: cough  . Metoprolol Itching  . Peanut-Containing Drug Products   . Penicillins     REACTION: rash  . Prednisone   . Strawberry Extract     MEDICATIONS: Current Outpatient Medications on File Prior to Visit  Medication Sig Dispense Refill  . acetaminophen (TYLENOL) 500 MG tablet Take 650 mg by mouth 3 (three) times daily as needed for pain.     [name] ALLEGRA-D ALLERGY & CONGESTION 180-240 MG 24 hr tablet TAKE 1 TABLET BY MOUTH DAILY 30 tablet 0  . Calcium Carbonate-Vitamin D (CALCIUM 600 + D PO) Take 2 tablets by mouth daily.      . clopidogrel (PLAVIX) 75 MG tablet Take 1 tablet (75 mg total) by mouth daily. 90 tablet 3  . Ferrous Sulfate (IRON) 325 (65 FE) MG TABS Take 1 tablet by mouth daily.      . furosemide (LASIX) 40 MG tablet TAKE 1 TABLET EVERY DAY  90 tablet 3  . gabapentin (NEURONTIN) 100 MG capsule TAKE 2 CAPSULES(200 MG) BY MOUTH AT BEDTIME 180 capsule 1  . glucosamine-chondroitin 500-400 MG tablet Take 1 tablet by mouth 2 (two) times daily.      [name] KLOR-CON M20 20 MEQ tablet TAKE 1 TABLET EVERY DAY 90 tablet 3  . metFORMIN (GLUCOPHAGE-XR) 500 MG 24 hr tablet TAKE 2 TABLETS TWICE DAILY 360 tablet 3  . Multiple Vitamin (MULTIVITAMIN) tablet Take 1 tablet by mouth daily.      . naproxen sodium (ANAPROX) 220 MG tablet Take 220 mg by mouth as needed.    . Omega-3 Fatty Acids (FISH OIL) 1000 MG CAPS Take 1 capsule by mouth daily.    [name] omeprazole (PRILOSEC) 20 MG capsule Take 20 mg by mouth daily as needed.     . Probiotic Product (PROBIOTIC-10) CAPS Take 1 capsule by mouth daily.    . pseudoephedrine-acetaminophen (TYLENOL SINUS) 30-500 MG TABS tablet Take 1 tablet by mouth as needed.    . pyridOXINE (VITAMIN B-6) 100 MG tablet Take 200 mg by mouth daily.    . rosuvastatin (CRESTOR) 40 MG tablet TAKE 1 TABLET EVERY DAY 90 tablet 3  . SF 5000 PLUS 1.1 % CREA dental cream   0  . topiramate (TOPAMAX) 100 MG tablet TAKE 1 TABLET EVERY  DAY 90 tablet 3  . Turmeric 500 MG TABS Take 2 tablets by mouth daily.    [name] venlafaxine XR (EFFEXOR-XR) 37.5 MG 24 hr capsule Take 1 capsule (37.5 mg total) by mouth daily with breakfast. 90 capsule 1  . vitamin E 200 UNIT capsule Take 200 Units by mouth daily.     Current Facility-Administered Medications on File Prior to Visit  Medication Dose Route Frequency Provider Last Rate Last Dose  . 0.9 %  sodium chloride infusion  500 mL Intravenous Continuous Nandigam, Venia Minks, MD        PAST MEDICAL HISTORY: Past Medical History:  Diagnosis Date  . Abnormal Pap smear   . ALLERGIC RHINITIS 10/22/2006  . ANEMIA 12/18/2008  . Arthritis   . ASYMPTOMATIC POSTMENOPAUSAL STATUS 11/22/2007  . Back pain   . Complication of anesthesia   . Degenerative arthritis   . DIABETES MELLITUS, TYPE II 01/13/2007  .  Dyslipidemia   . Fibroid   . GERD 07/20/2007  . GOITER, MULTINODULAR 07/20/2007  . Headache(784.0) 07/20/2007  . HEARING LOSS 11/22/2007  . Hiatal hernia   . Hx of colposcopy with cervical biopsy   . HYPERCHOLESTEROLEMIA 01/13/2007  . Hyperglycemia   . HYPERTENSION 10/22/2006  . NASH (nonalcoholic steatohepatitis)   . Nocturnal hypoxemia 02/17/2013  . Obesity   . Obstructive sleep apnea   . OSTEOARTHRITIS 10/22/2006  . Other chronic nonalcoholic liver disease 07/02/8880  . Sleep apnea     PAST SURGICAL HISTORY: Past Surgical History:  Procedure Laterality Date  . BREAST SURGERY     Breast reduction  . CATARACT EXTRACTION, BILATERAL    . DEXA  08/2005  . DILATION AND CURETTAGE OF UTERUS    . ELECTROCARDIOGRAM  10/15/2006  . ESOPHAGOGASTRODUODENOSCOPY  12/08/2005  . Stress Cardiolite  10/21/2005  . sweat gland removal    . WISDOM TOOTH EXTRACTION      SOCIAL HISTORY: Social History   Tobacco Use  . Smoking status: Former Smoker    Packs/day: 2.00    Years: 20.00    Pack years: 40.00    Types: Cigarettes    Last attempt to quit: 03/03/1979    Years since quitting: 38.4  . Smokeless tobacco: Never Used  Substance Use Topics  . Alcohol use: Yes    Comment: rare  . Drug use: No    FAMILY HISTORY: Family History  Problem Relation Age of Onset  . Asthma Mother   . Depression Mother   . Bipolar disorder Mother   . Dementia Mother   . Arthritis Mother   . Breast cancer Mother        primary  . Colon cancer Mother        mets from breast  . Cancer Mother   . Hypertension Father   . Migraines Father   . Cancer Maternal Aunt   . Heart disease Maternal Grandmother   . Cancer Maternal Grandfather     ROS: Review of Systems  Constitutional: Negative for weight loss.  Cardiovascular: Negative for palpitations.  Neurological: Negative for dizziness.       Negative for lightheadedness    PHYSICAL EXAM: Blood pressure (!) 88/58, pulse 77, height 5' 4"  (1.626 m),  weight 212 lb (96.2 kg), SpO2 99 %. Body mass index is 36.39 kg/m. Physical Exam  Constitutional: She is oriented to person, place, and time. She appears well-developed and well-nourished.  Cardiovascular: Normal rate.  Pulmonary/Chest: Effort normal.  Musculoskeletal: Normal range of motion.  Neurological: She is  oriented to person, place, and time.  Skin: Skin is warm and dry.  Psychiatric: She has a normal mood and affect. Her behavior is normal.  Vitals reviewed.   RECENT LABS AND TESTS: BMET    Component Value Date/Time   NA 140 07/29/2017 1040   K 3.8 07/29/2017 1040   CL 102 07/29/2017 1040   CO2 24 07/29/2017 1040   GLUCOSE 90 07/29/2017 1040   GLUCOSE 116 (H) 03/05/2016 0958   BUN 17 07/29/2017 1040   CREATININE 0.78 07/29/2017 1040   CREATININE 0.81 12/28/2011 0853   CALCIUM 9.5 07/29/2017 1040   GFRNONAA 77 07/29/2017 1040   GFRAA 89 07/29/2017 1040   Lab Results  Component Value Date   HGBA1C 5.7 (H) 07/29/2017   HGBA1C 5.9 03/15/2017   HGBA1C 6.3 (H) 12/23/2016   HGBA1C 6.3 09/08/2016   HGBA1C 6.4 03/05/2016   Lab Results  Component Value Date   INSULIN 6.8 07/29/2017   INSULIN 20.9 12/23/2016   CBC    Component Value Date/Time   WBC 4.9 12/23/2016 0956   WBC 7.2 03/05/2016 0958   RBC 4.32 12/23/2016 0956   RBC 4.46 03/05/2016 0958   HGB 12.1 12/23/2016 0956   HCT 37.4 12/23/2016 0956   PLT 300.0 03/05/2016 0958   MCV 87 12/23/2016 0956   MCH 28.0 12/23/2016 0956   MCH 29.4 12/28/2011 0853   MCHC 32.4 12/23/2016 0956   MCHC 32.9 03/05/2016 0958   RDW 17.5 (H) 12/23/2016 0956   LYMPHSABS 1.2 12/23/2016 0956   MONOABS 0.4 03/05/2016 0958   EOSABS 0.1 12/23/2016 0956   BASOSABS 0.0 12/23/2016 0956   Iron/TIBC/Ferritin/ %Sat    Component Value Date/Time   IRON 38 12/23/2016 0956   TIBC 271 12/23/2016 0956   FERRITIN 40 12/23/2016 0956   IRONPCTSAT 14 (L) 12/23/2016 0956   Lipid Panel     Component Value Date/Time   CHOL 195  07/29/2017 1040   TRIG 68 07/29/2017 1040   HDL 72 07/29/2017 1040   CHOLHDL 3 03/05/2016 0958   VLDL 26.0 03/05/2016 0958   LDLCALC 109 (H) 07/29/2017 1040   Hepatic Function Panel     Component Value Date/Time   PROT 7.5 07/29/2017 1040   ALBUMIN 4.3 07/29/2017 1040   AST 15 07/29/2017 1040   ALT 10 07/29/2017 1040   ALKPHOS 71 07/29/2017 1040   BILITOT <0.2 07/29/2017 1040   BILIDIR 0.1 02/15/2015 1010   IBILI 0.2 12/28/2011 0853      Component Value Date/Time   TSH 1.170 12/23/2016 0956   TSH 1.76 03/05/2016 0958   TSH 1.86 02/15/2015 1010   Results for DAJIA, GUNNELS (MRN [id number]) as of 08/17/2017 08:35  Ref. Range 07/29/2017 10:40  Vitamin D, 25-Hydroxy Latest Ref Range: 30.0 - 100.0 ng/mL 40.7   ASSESSMENT AND PLAN: Other specified hypotension  Class 2 severe obesity with serious comorbidity and body mass index (BMI) of 36.0 to 36.9 in adult, unspecified obesity type ([place])  PLAN:  Hypotension Heather [name] will continue to increase her H2O intake. We will continue to monitor her blood pressure as well as her progress with the above lifestyle modifications. We may need to decrease Lasix in the near future. Heather [name] agreed to follow up as directed.   We spent > than 50% of the 15 minute visit on the counseling as documented in the note.  Obesity Heather [name] is currently in the action stage of change. As such, her goal is to continue with weight loss efforts  She has agreed to follow the Category 2 plan Heather [name] has been instructed to work up to a goal of 150 minutes of combined cardio and strengthening exercise per week for weight loss and overall health benefits. We discussed the following Behavioral Modification Strategies today: increase H2O intake, no skipping meals, increasing lean protein intake and decreasing simple carbohydrates   Heather [name] has agreed to follow up with our clinic in 3 weeks. She was informed of the importance of frequent follow up visits to maximize her success  with intensive lifestyle modifications for her multiple health conditions.   OBESITY BEHAVIORAL INTERVENTION VISIT  Today's visit was # 14 out of 22.  Starting weight: 268 lbs Starting date: 12/23/16 Today's weight : 212 lbs Today's date: 08/16/2017 Total lbs lost to date: 61 (Patients must lose 7 lbs in the first 6 months to continue with counseling)   ASK: We discussed the diagnosis of obesity with Heather [name] today and Heather [name] agreed to give [name] permission to discuss obesity behavioral modification therapy today.  ASSESS: Heather [name] has the diagnosis of obesity and her BMI today is 36.37 Heather [name] is in the action stage of change   ADVISE: Heather [name] was educated on the multiple health risks of obesity as well as the benefit of weight loss to improve her health. She was advised of the need for long term treatment and the importance of lifestyle modifications.  AGREE: Multiple dietary modification options and treatment options were discussed and  Heather [name] agreed to the above obesity treatment plan.  I, Doreene Nest, am acting as transcriptionist for [name], MD  I have reviewed the above documentation for accuracy and completeness, and I agree with the above. -[name], MD

## 2017-08-18 ENCOUNTER — Telehealth: Payer: Self-pay | Admitting: *Deleted

## 2017-08-18 DIAGNOSIS — Z9889 Other specified postprocedural states: Secondary | ICD-10-CM

## 2017-08-18 NOTE — Telephone Encounter (Signed)
-----   Message from Rip Harbour, [facility] sent at 08/17/2017  3:31 PM EDT ----- Regarding: PT  PT (in house) [facility] referral -  S/p bunionectomy right and right knee pain  - DOS 06-04-17  eval and treat, duration-3 x wkly x 4 weeks

## 2017-08-18 NOTE — Telephone Encounter (Signed)
Hand delivered [facility] - In-office.

## 2017-08-24 ENCOUNTER — Telehealth: Payer: Self-pay | Admitting: Endocrinology

## 2017-08-24 ENCOUNTER — Other Ambulatory Visit: Payer: Self-pay | Admitting: Endocrinology

## 2017-08-24 DIAGNOSIS — M170 Bilateral primary osteoarthritis of knee: Secondary | ICD-10-CM

## 2017-08-24 NOTE — Telephone Encounter (Signed)
Copied from [place] [phone]. Topic: General - Other >> Aug 24, 2017  1:56 PM Burchel, Abbi R wrote: Reason for CRM:   Pt is beginning PT w/ Lannie Fields @ [facility] [phone]) tomorrow and would like to take PT for her knee a this location as well.  Pt is requesting Dr Tamala Julian add on any orders he would like add to her PT care plan.   Pt call back #: [phone]

## 2017-08-24 NOTE — Telephone Encounter (Signed)
Referral faxed. Pt made aware.

## 2017-08-25 DIAGNOSIS — R269 Unspecified abnormalities of gait and mobility: Secondary | ICD-10-CM | POA: Diagnosis not present

## 2017-08-25 DIAGNOSIS — M25474 Effusion, right foot: Secondary | ICD-10-CM | POA: Diagnosis not present

## 2017-08-25 DIAGNOSIS — M25571 Pain in right ankle and joints of right foot: Secondary | ICD-10-CM | POA: Diagnosis not present

## 2017-08-25 DIAGNOSIS — M25674 Stiffness of right foot, not elsewhere classified: Secondary | ICD-10-CM | POA: Diagnosis not present

## 2017-08-30 DIAGNOSIS — M25474 Effusion, right foot: Secondary | ICD-10-CM | POA: Diagnosis not present

## 2017-08-30 DIAGNOSIS — M25674 Stiffness of right foot, not elsewhere classified: Secondary | ICD-10-CM | POA: Diagnosis not present

## 2017-08-30 DIAGNOSIS — M25571 Pain in right ankle and joints of right foot: Secondary | ICD-10-CM | POA: Diagnosis not present

## 2017-08-30 DIAGNOSIS — R269 Unspecified abnormalities of gait and mobility: Secondary | ICD-10-CM | POA: Diagnosis not present

## 2017-09-07 DIAGNOSIS — M25571 Pain in right ankle and joints of right foot: Secondary | ICD-10-CM | POA: Diagnosis not present

## 2017-09-07 DIAGNOSIS — M25474 Effusion, right foot: Secondary | ICD-10-CM | POA: Diagnosis not present

## 2017-09-07 DIAGNOSIS — M25674 Stiffness of right foot, not elsewhere classified: Secondary | ICD-10-CM | POA: Diagnosis not present

## 2017-09-07 DIAGNOSIS — R269 Unspecified abnormalities of gait and mobility: Secondary | ICD-10-CM | POA: Diagnosis not present

## 2017-09-08 ENCOUNTER — Ambulatory Visit (INDEPENDENT_AMBULATORY_CARE_PROVIDER_SITE_OTHER): Payer: Medicare Other | Admitting: Family Medicine

## 2017-09-08 VITALS — BP 94/61 | HR 75 | Temp 97.7°F | Ht 64.0 in | Wt 209.0 lb

## 2017-09-08 DIAGNOSIS — Z6836 Body mass index (BMI) 36.0-36.9, adult: Secondary | ICD-10-CM | POA: Diagnosis not present

## 2017-09-08 DIAGNOSIS — I9589 Other hypotension: Secondary | ICD-10-CM

## 2017-09-08 NOTE — Progress Notes (Signed)
Office: [phone]  /  Fax: [phone]   HPI:   Chief Complaint: OBESITY Heather [name] is here to discuss her progress with her obesity treatment plan. She is on the Category 2 plan and is following her eating plan approximately 50 % of the time. She states she is exercising 0 minutes 0 times per week. Heather [name] did well with weight loss, even while going to see family and experiencing some sabotage. She practiced mindful eating. Heather [name] reports, she is not always eating three meals a day. Her weight is 209 lb (94.8 kg) today and has had a weight loss of 3 pounds over a period of 3 weeks since her last visit. She has lost 59 lbs since starting treatment with [name].  Hypotension Heather [name] is a 72 y.o. female with hypotension. [place]bin reports she is increasing the amount of water she has been drinking. She denies dizziness or lightheadedness. Heather [name] blood pressure is not currently controlled.  ALLERGIES: Allergies  Allergen Reactions  . Aspirin   . Coconut Oil   . Latex   . Lisinopril     REACTION: cough  . Metoprolol Itching  . Peanut-Containing Drug Products   . Penicillins     REACTION: rash  . Prednisone   . Strawberry Extract     MEDICATIONS: Current Outpatient Medications on File Prior to Visit  Medication Sig Dispense Refill  . acetaminophen (TYLENOL) 500 MG tablet Take 650 mg by mouth 3 (three) times daily as needed for pain.     [name] ALLEGRA-D ALLERGY & CONGESTION 180-240 MG 24 hr tablet TAKE 1 TABLET BY MOUTH DAILY 30 tablet 0  . Calcium Carbonate-Vitamin D (CALCIUM 600 + D PO) Take 2 tablets by mouth daily.      . clopidogrel (PLAVIX) 75 MG tablet Take 1 tablet (75 mg total) by mouth daily. 90 tablet 3  . Ferrous Sulfate (IRON) 325 (65 FE) MG TABS Take 1 tablet by mouth daily.      . furosemide (LASIX) 40 MG tablet TAKE 1 TABLET EVERY DAY 90 tablet 3  . gabapentin (NEURONTIN) 100 MG capsule TAKE 2 CAPSULES(200 MG) BY MOUTH AT BEDTIME 180 capsule 1  . glucosamine-chondroitin  500-400 MG tablet Take 1 tablet by mouth 2 (two) times daily.      [name] KLOR-CON M20 20 MEQ tablet TAKE 1 TABLET EVERY DAY 90 tablet 3  . metFORMIN (GLUCOPHAGE-XR) 500 MG 24 hr tablet TAKE 2 TABLETS TWICE DAILY 360 tablet 3  . Multiple Vitamin (MULTIVITAMIN) tablet Take 1 tablet by mouth daily.      . naproxen sodium (ANAPROX) 220 MG tablet Take 220 mg by mouth as needed.    . Omega-3 Fatty Acids (FISH OIL) 1000 MG CAPS Take 1 capsule by mouth daily.    [name] omeprazole (PRILOSEC) 20 MG capsule Take 20 mg by mouth daily as needed.     . Probiotic Product (PROBIOTIC-10) CAPS Take 1 capsule by mouth daily.    . pseudoephedrine-acetaminophen (TYLENOL SINUS) 30-500 MG TABS tablet Take 1 tablet by mouth as needed.    . pyridOXINE (VITAMIN B-6) 100 MG tablet Take 200 mg by mouth daily.    . rosuvastatin (CRESTOR) 40 MG tablet TAKE 1 TABLET EVERY DAY 90 tablet 3  . SF 5000 PLUS 1.1 % CREA dental cream   0  . topiramate (TOPAMAX) 100 MG tablet TAKE 1 TABLET EVERY DAY 90 tablet 3  . Turmeric 500 MG TABS Take 2 tablets by mouth daily.    [name] venlafaxine  XR (EFFEXOR-XR) 37.5 MG 24 hr capsule Take 1 capsule (37.5 mg total) by mouth daily with breakfast. 90 capsule 1  . vitamin E 200 UNIT capsule Take 200 Units by mouth daily.     Current Facility-Administered Medications on File Prior to Visit  Medication Dose Route Frequency Provider Last Rate Last Dose  . 0.9 %  sodium chloride infusion  500 mL Intravenous Continuous Nandigam, Venia Minks, MD        PAST MEDICAL HISTORY: Past Medical History:  Diagnosis Date  . Abnormal Pap smear   . ALLERGIC RHINITIS 10/22/2006  . ANEMIA 12/18/2008  . Arthritis   . ASYMPTOMATIC POSTMENOPAUSAL STATUS 11/22/2007  . Back pain   . Complication of anesthesia   . Degenerative arthritis   . DIABETES MELLITUS, TYPE II 01/13/2007  . Dyslipidemia   . Fibroid   . GERD 07/20/2007  . GOITER, MULTINODULAR 07/20/2007  . Headache(784.0) 07/20/2007  . HEARING LOSS 11/22/2007  . Hiatal  hernia   . Hx of colposcopy with cervical biopsy   . HYPERCHOLESTEROLEMIA 01/13/2007  . Hyperglycemia   . HYPERTENSION 10/22/2006  . NASH (nonalcoholic steatohepatitis)   . Nocturnal hypoxemia 02/17/2013  . Obesity   . Obstructive sleep apnea   . OSTEOARTHRITIS 10/22/2006  . Other chronic nonalcoholic liver disease 4/33/2951  . Sleep apnea     PAST SURGICAL HISTORY: Past Surgical History:  Procedure Laterality Date  . BREAST SURGERY     Breast reduction  . CATARACT EXTRACTION, BILATERAL    . DEXA  08/2005  . DILATION AND CURETTAGE OF UTERUS    . ELECTROCARDIOGRAM  10/15/2006  . ESOPHAGOGASTRODUODENOSCOPY  12/08/2005  . Stress Cardiolite  10/21/2005  . sweat gland removal    . WISDOM TOOTH EXTRACTION      SOCIAL HISTORY: Social History   Tobacco Use  . Smoking status: Former Smoker    Packs/day: 2.00    Years: 20.00    Pack years: 40.00    Types: Cigarettes    Last attempt to quit: 03/03/1979    Years since quitting: 38.5  . Smokeless tobacco: Never Used  Substance Use Topics  . Alcohol use: Yes    Comment: rare  . Drug use: No    FAMILY HISTORY: Family History  Problem Relation Age of Onset  . Asthma Mother   . Depression Mother   . Bipolar disorder Mother   . Dementia Mother   . Arthritis Mother   . Breast cancer Mother        primary  . Colon cancer Mother        mets from breast  . Cancer Mother   . Hypertension Father   . Migraines Father   . Cancer Maternal Aunt   . Heart disease Maternal Grandmother   . Cancer Maternal Grandfather     ROS: Review of Systems  Constitutional: Positive for weight loss.  Neurological: Negative for dizziness.       Negative for lightheadedness    PHYSICAL EXAM: Blood pressure 94/61, pulse 75, temperature 97.7 F (36.5 C), temperature source Oral, height 5' 4"  (1.626 m), weight 209 lb (94.8 kg), SpO2 95 %. Body mass index is 35.87 kg/m. Physical Exam  Constitutional: She is oriented to person, place, and time.  She appears well-developed and well-nourished.  Cardiovascular: Normal rate.  Pulmonary/Chest: Effort normal.  Musculoskeletal: Normal range of motion.  Neurological: She is oriented to person, place, and time.  Skin: Skin is warm and dry.  Psychiatric: She has a normal  mood and affect. Her behavior is normal.  Vitals reviewed.   RECENT LABS AND TESTS: BMET    Component Value Date/Time   NA 140 07/29/2017 1040   K 3.8 07/29/2017 1040   CL 102 07/29/2017 1040   CO2 24 07/29/2017 1040   GLUCOSE 90 07/29/2017 1040   GLUCOSE 116 (H) 03/05/2016 0958   BUN 17 07/29/2017 1040   CREATININE 0.78 07/29/2017 1040   CREATININE 0.81 12/28/2011 0853   CALCIUM 9.5 07/29/2017 1040   GFRNONAA 77 07/29/2017 1040   GFRAA 89 07/29/2017 1040   Lab Results  Component Value Date   HGBA1C 5.7 (H) 07/29/2017   HGBA1C 5.9 03/15/2017   HGBA1C 6.3 (H) 12/23/2016   HGBA1C 6.3 09/08/2016   HGBA1C 6.4 03/05/2016   Lab Results  Component Value Date   INSULIN 6.8 07/29/2017   INSULIN 20.9 12/23/2016   CBC    Component Value Date/Time   WBC 4.9 12/23/2016 0956   WBC 7.2 03/05/2016 0958   RBC 4.32 12/23/2016 0956   RBC 4.46 03/05/2016 0958   HGB 12.1 12/23/2016 0956   HCT 37.4 12/23/2016 0956   PLT 300.0 03/05/2016 0958   MCV 87 12/23/2016 0956   MCH 28.0 12/23/2016 0956   MCH 29.4 12/28/2011 0853   MCHC 32.4 12/23/2016 0956   MCHC 32.9 03/05/2016 0958   RDW 17.5 (H) 12/23/2016 0956   LYMPHSABS 1.2 12/23/2016 0956   MONOABS 0.4 03/05/2016 0958   EOSABS 0.1 12/23/2016 0956   BASOSABS 0.0 12/23/2016 0956   Iron/TIBC/Ferritin/ %Sat    Component Value Date/Time   IRON 38 12/23/2016 0956   TIBC 271 12/23/2016 0956   FERRITIN 40 12/23/2016 0956   IRONPCTSAT 14 ([name]) 12/23/2016 0956   Lipid Panel     Component Value Date/Time   CHOL 195 07/29/2017 1040   TRIG 68 07/29/2017 1040   HDL 72 07/29/2017 1040   CHOLHDL 3 03/05/2016 0958   VLDL 26.0 03/05/2016 0958   LDLCALC 109 (H)  07/29/2017 1040   Hepatic Function Panel     Component Value Date/Time   PROT 7.5 07/29/2017 1040   ALBUMIN 4.3 07/29/2017 1040   AST 15 07/29/2017 1040   ALT 10 07/29/2017 1040   ALKPHOS 71 07/29/2017 1040   BILITOT <0.2 07/29/2017 1040   BILIDIR 0.1 02/15/2015 1010   IBILI 0.2 12/28/2011 0853      Component Value Date/Time   TSH 1.170 12/23/2016 0956   TSH 1.76 03/05/2016 0958   TSH 1.86 02/15/2015 1010   Results for Heather [name], Heather [name] (MRN [id number]) as of 09/08/2017 15:59  Ref. Range 07/29/2017 10:40  Vitamin D, 25-Hydroxy Latest Ref Range: 30.0 - 100.0 ng/mL 40.7   ASSESSMENT AND PLAN: Other specified hypotension  Class 2 severe obesity with serious comorbidity and body mass index (BMI) of 36.0 to 36.9 in adult, unspecified obesity type ([place])  PLAN:  Hypotension Heather [name] will continue with increasing water intake. We will repeat blood pressure at each visit and will continue to monitor  her progress with the above lifestyle modifications. She will continue her medications as prescribed and will watch for signs of hypotension as she continues her lifestyle modifications.  Obesity Heather [name] is currently in the action stage of change. As such, her goal is to continue with weight loss efforts She has agreed to follow the Category 2 plan Heather [name] has been instructed to work up to a goal of 150 minutes of combined cardio and strengthening exercise per week for weight loss and  overall health benefits. We discussed the following Behavioral Modification Strategies today: increasing lean protein intake and no skipping meals Aja was encouraged to not skip meals, and reminded patient that the timing of food does not matter.  Heather [name] has agreed to follow up with our clinic in 3 weeks. She was informed of the importance of frequent follow up visits to maximize her success with intensive lifestyle modifications for her multiple health conditions.   OBESITY BEHAVIORAL INTERVENTION  VISIT  Today'Heather visit was # 15 out of 22.  Starting weight: 268 lbs Starting date: 12/23/16 Today'Heather weight : 209 lbs Today'Heather date: 09/08/2017 Total lbs lost to date: 45 (Patients must lose 7 lbs in the first 6 months to continue with counseling)   ASK: We discussed the diagnosis of obesity with Heather [name] today and Heather [name] agreed to give [name] permission to discuss obesity behavioral modification therapy today.  ASSESS: Heather [name] has the diagnosis of obesity and her BMI today is 35.86 Heather [name] is in the action stage of change   ADVISE: Heather [name] was educated on the multiple health risks of obesity as well as the benefit of weight loss to improve her health. She was advised of the need for long term treatment and the importance of lifestyle modifications.  AGREE: Multiple dietary modification options and treatment options were discussed and  Heather [name] agreed to the above obesity treatment plan.  I, Doreene Nest, am acting as transcriptionist for [name], MD  I have reviewed the above documentation for accuracy and completeness, and I agree with the above. -[name], MD

## 2017-09-09 DIAGNOSIS — R269 Unspecified abnormalities of gait and mobility: Secondary | ICD-10-CM | POA: Diagnosis not present

## 2017-09-09 DIAGNOSIS — M25571 Pain in right ankle and joints of right foot: Secondary | ICD-10-CM | POA: Diagnosis not present

## 2017-09-09 DIAGNOSIS — M25674 Stiffness of right foot, not elsewhere classified: Secondary | ICD-10-CM | POA: Diagnosis not present

## 2017-09-09 DIAGNOSIS — M25474 Effusion, right foot: Secondary | ICD-10-CM | POA: Diagnosis not present

## 2017-09-13 ENCOUNTER — Encounter: Payer: Self-pay | Admitting: Endocrinology

## 2017-09-13 ENCOUNTER — Ambulatory Visit (INDEPENDENT_AMBULATORY_CARE_PROVIDER_SITE_OTHER): Payer: Medicare Other | Admitting: Endocrinology

## 2017-09-13 VITALS — BP 114/70 | HR 82 | Ht 64.0 in | Wt 215.2 lb

## 2017-09-13 DIAGNOSIS — I2510 Atherosclerotic heart disease of native coronary artery without angina pectoris: Secondary | ICD-10-CM | POA: Diagnosis not present

## 2017-09-13 DIAGNOSIS — E119 Type 2 diabetes mellitus without complications: Secondary | ICD-10-CM

## 2017-09-13 NOTE — Progress Notes (Signed)
Subjective:    Patient ID: Heather [name], female    DOB: Jul 11, 1945, 72 y.o.   MRN: [id number]  HPI Pt returns for f/u of diabetes mellitus: DM type: 2 Dx'ed: 6734 Complications: none Therapy: metformin.  GDM: never DKA: never Severe hypoglycemia: never. Pancreatitis: never. Other: she did not tolerate pioglitizone (edema); pt says she was told by surgeon she was not a good candidate for weight loss surgery, due to abd hernia; she does not check cbg's.  Interval history:pt states she feels well in general.  no cbg record, but states cbg's are well-controlled.   Past Medical History:  Diagnosis Date  . Abnormal Pap smear   . ALLERGIC RHINITIS 10/22/2006  . ANEMIA 12/18/2008  . Arthritis   . ASYMPTOMATIC POSTMENOPAUSAL STATUS 11/22/2007  . Back pain   . Complication of anesthesia   . Degenerative arthritis   . DIABETES MELLITUS, TYPE II 01/13/2007  . Dyslipidemia   . Fibroid   . GERD 07/20/2007  . GOITER, MULTINODULAR 07/20/2007  . Headache(784.0) 07/20/2007  . HEARING LOSS 11/22/2007  . Hiatal hernia   . Hx of colposcopy with cervical biopsy   . HYPERCHOLESTEROLEMIA 01/13/2007  . Hyperglycemia   . HYPERTENSION 10/22/2006  . NASH (nonalcoholic steatohepatitis)   . Nocturnal hypoxemia 02/17/2013  . Obesity   . Obstructive sleep apnea   . OSTEOARTHRITIS 10/22/2006  . Other chronic nonalcoholic liver disease 1/93/7902  . Sleep apnea     Past Surgical History:  Procedure Laterality Date  . BREAST SURGERY     Breast reduction  . CATARACT EXTRACTION, BILATERAL    . DEXA  08/2005  . DILATION AND CURETTAGE OF UTERUS    . ELECTROCARDIOGRAM  10/15/2006  . ESOPHAGOGASTRODUODENOSCOPY  12/08/2005  . Stress Cardiolite  10/21/2005  . sweat gland removal    . WISDOM TOOTH EXTRACTION      Social History   Socioeconomic History  . Marital status: Married    Spouse name: [name]  . Number of children: 2  . Years of education: College  . Highest education level: Not on file    Occupational History  . Occupation: Retired  Scientific laboratory technician  . Financial resource strain: Not on file  . Food insecurity:    Worry: Not on file    Inability: Not on file  . Transportation needs:    Medical: Not on file    Non-medical: Not on file  Tobacco Use  . Smoking status: Former Smoker    Packs/day: 2.00    Years: 20.00    Pack years: 40.00    Types: Cigarettes    Last attempt to quit: 03/03/1979    Years since quitting: 38.5  . Smokeless tobacco: Never Used  Substance and Sexual Activity  . Alcohol use: Yes    Comment: rare  . Drug use: No  . Sexual activity: Yes    Birth control/protection: Surgical  Lifestyle  . Physical activity:    Days per week: Not on file    Minutes per session: Not on file  . Stress: Not on file  Relationships  . Social connections:    Talks on phone: Not on file    Gets together: Not on file    Attends religious service: Not on file    Active member of club or organization: Not on file    Attends meetings of clubs or organizations: Not on file    Relationship status: Not on file  . Intimate partner violence:    Fear of  current or ex partner: Not on file    Emotionally abused: Not on file    Physically abused: Not on file    Forced sexual activity: Not on file  Other Topics Concern  . Not on file  Social History Narrative   Patient is married [name]).   Patient has two children.   Patient is retired.   Patient has a college education.   Patient is right-handed.   Patient lives at home with family.   Caffeine Use: 2 soda every other day    Current Outpatient Medications on File Prior to Visit  Medication Sig Dispense Refill  . acetaminophen (TYLENOL) 500 MG tablet Take 650 mg by mouth 3 (three) times daily as needed for pain.     [name] ALLEGRA-D ALLERGY & CONGESTION 180-240 MG 24 hr tablet TAKE 1 TABLET BY MOUTH DAILY 30 tablet 0  . Calcium Carbonate-Vitamin D (CALCIUM 600 + D PO) Take 2 tablets by mouth daily.      . clopidogrel  (PLAVIX) 75 MG tablet Take 1 tablet (75 mg total) by mouth daily. 90 tablet 3  . Ferrous Sulfate (IRON) 325 (65 FE) MG TABS Take 1 tablet by mouth daily.      . furosemide (LASIX) 40 MG tablet TAKE 1 TABLET EVERY DAY 90 tablet 3  . gabapentin (NEURONTIN) 100 MG capsule TAKE 2 CAPSULES(200 MG) BY MOUTH AT BEDTIME 180 capsule 1  . glucosamine-chondroitin 500-400 MG tablet Take 1 tablet by mouth 2 (two) times daily.      [name] KLOR-CON M20 20 MEQ tablet TAKE 1 TABLET EVERY DAY 90 tablet 3  . metFORMIN (GLUCOPHAGE-XR) 500 MG 24 hr tablet TAKE 2 TABLETS TWICE DAILY 360 tablet 3  . Multiple Vitamin (MULTIVITAMIN) tablet Take 1 tablet by mouth daily.      . naproxen sodium (ANAPROX) 220 MG tablet Take 220 mg by mouth as needed.    . Omega-3 Fatty Acids (FISH OIL) 1000 MG CAPS Take 1 capsule by mouth daily.    [name] omeprazole (PRILOSEC) 20 MG capsule Take 20 mg by mouth daily as needed.     . Probiotic Product (PROBIOTIC-10) CAPS Take 1 capsule by mouth daily.    . pseudoephedrine-acetaminophen (TYLENOL SINUS) 30-500 MG TABS tablet Take 1 tablet by mouth as needed.    . pyridOXINE (VITAMIN B-6) 100 MG tablet Take 200 mg by mouth daily.    . rosuvastatin (CRESTOR) 40 MG tablet TAKE 1 TABLET EVERY DAY 90 tablet 3  . SF 5000 PLUS 1.1 % CREA dental cream   0  . topiramate (TOPAMAX) 100 MG tablet TAKE 1 TABLET EVERY DAY 90 tablet 3  . Turmeric 500 MG TABS Take 2 tablets by mouth daily.    [name] venlafaxine XR (EFFEXOR-XR) 37.5 MG 24 hr capsule Take 1 capsule (37.5 mg total) by mouth daily with breakfast. 90 capsule 1  . vitamin E 200 UNIT capsule Take 200 Units by mouth daily.     Current Facility-Administered Medications on File Prior to Visit  Medication Dose Route Frequency Provider Last Rate Last Dose  . 0.9 %  sodium chloride infusion  500 mL Intravenous Continuous Nandigam, Venia Minks, MD        Allergies  Allergen Reactions  . Aspirin   . Coconut Oil   . Latex   . Lisinopril     REACTION: cough  .  Metoprolol Itching  . Peanut-Containing Drug Products   . Penicillins     REACTION: rash  . Prednisone   .  Strawberry Extract     Family History  Problem Relation Age of Onset  . Asthma Mother   . Depression Mother   . Bipolar disorder Mother   . Dementia Mother   . Arthritis Mother   . Breast cancer Mother        primary  . Colon cancer Mother        mets from breast  . Cancer Mother   . Hypertension Father   . Migraines Father   . Cancer Maternal Aunt   . Heart disease Maternal Grandmother   . Cancer Maternal Grandfather     BP 114/70   Pulse 82   Ht 5' 4"  (1.626 m)   Wt 215 lb 3.2 oz (97.6 kg)   SpO2 98%   BMI 36.94 kg/m    Review of Systems Denies sob    Objective:   Physical Exam VITAL SIGNS:  See vs page.  GENERAL: no distress.  Pulses: left dorsalis pedis is intact.   MSK: no deformity of the left foot.  CV: 1+ bilat leg edema.  Skin:  no ulcer on the left foot, but the skin is dry,and there are heavy calluses.  normal color and temp on the left foot  Neuro: sensation is intact to touch on the left foot  Ext: Right foot is bandaged.     Lab Results  Component Value Date   HGBA1C 5.7 (H) 07/29/2017   Lab Results  Component Value Date   CREATININE 0.78 07/29/2017   BUN 17 07/29/2017   NA 140 07/29/2017   K 3.8 07/29/2017   CL 102 07/29/2017   CO2 24 07/29/2017       Assessment & Plan:  DM: Wellness visit today, with problems stable except as noted  HTN: well-controlled.   Patient Instructions  Please continue the same medications.   Please come back for a follow-up appointment in 6 months.

## 2017-09-13 NOTE — Patient Instructions (Addendum)
Please continue the same medications.   Please come back for a follow-up appointment in 6 months.

## 2017-09-14 DIAGNOSIS — M25674 Stiffness of right foot, not elsewhere classified: Secondary | ICD-10-CM | POA: Diagnosis not present

## 2017-09-14 DIAGNOSIS — M25571 Pain in right ankle and joints of right foot: Secondary | ICD-10-CM | POA: Diagnosis not present

## 2017-09-14 DIAGNOSIS — R269 Unspecified abnormalities of gait and mobility: Secondary | ICD-10-CM | POA: Diagnosis not present

## 2017-09-14 DIAGNOSIS — M25474 Effusion, right foot: Secondary | ICD-10-CM | POA: Diagnosis not present

## 2017-09-16 DIAGNOSIS — M25474 Effusion, right foot: Secondary | ICD-10-CM | POA: Diagnosis not present

## 2017-09-16 DIAGNOSIS — M25674 Stiffness of right foot, not elsewhere classified: Secondary | ICD-10-CM | POA: Diagnosis not present

## 2017-09-16 DIAGNOSIS — R269 Unspecified abnormalities of gait and mobility: Secondary | ICD-10-CM | POA: Diagnosis not present

## 2017-09-16 DIAGNOSIS — M25571 Pain in right ankle and joints of right foot: Secondary | ICD-10-CM | POA: Diagnosis not present

## 2017-09-21 ENCOUNTER — Other Ambulatory Visit: Payer: Self-pay | Admitting: Endocrinology

## 2017-09-21 ENCOUNTER — Ambulatory Visit (INDEPENDENT_AMBULATORY_CARE_PROVIDER_SITE_OTHER): Payer: Medicare Other

## 2017-09-21 ENCOUNTER — Encounter: Payer: Self-pay | Admitting: Podiatry

## 2017-09-21 ENCOUNTER — Ambulatory Visit (INDEPENDENT_AMBULATORY_CARE_PROVIDER_SITE_OTHER): Payer: Medicare Other | Admitting: Podiatry

## 2017-09-21 DIAGNOSIS — M2011 Hallux valgus (acquired), right foot: Secondary | ICD-10-CM

## 2017-09-21 DIAGNOSIS — M2041 Other hammer toe(s) (acquired), right foot: Secondary | ICD-10-CM

## 2017-09-21 DIAGNOSIS — Z9889 Other specified postprocedural states: Secondary | ICD-10-CM

## 2017-09-21 DIAGNOSIS — M25571 Pain in right ankle and joints of right foot: Secondary | ICD-10-CM | POA: Diagnosis not present

## 2017-09-21 DIAGNOSIS — R269 Unspecified abnormalities of gait and mobility: Secondary | ICD-10-CM | POA: Diagnosis not present

## 2017-09-21 DIAGNOSIS — M25474 Effusion, right foot: Secondary | ICD-10-CM | POA: Diagnosis not present

## 2017-09-21 DIAGNOSIS — M25674 Stiffness of right foot, not elsewhere classified: Secondary | ICD-10-CM | POA: Diagnosis not present

## 2017-09-22 NOTE — Progress Notes (Signed)
She presents today for surgical follow-up states that she is doing great.  Date of surgery was June 04, 2017 status post Heather [name] bunionectomy second metatarsal osteotomy with hammertoe repair.  She states that the only thing really bothers me is the toenail on the second toe.  Objective: Vital signs are stable she is alert and oriented x3 there is minimal edema no erythema cellulitis drainage or great range of motion of the first metatarsal and second metatarsal phalangeal joint.  Nail plate appears to be thickened with possibly layered nail I debrided that today and it looks perfectly normal at this point.  Radiographs taken today demonstrate well-healed osteotomies.  Assessment: Well-healing surgical foot.  Plan: Number to get back to her regular routine follow-up with her in 2 months for her final set of x-rays

## 2017-09-23 DIAGNOSIS — M25474 Effusion, right foot: Secondary | ICD-10-CM | POA: Diagnosis not present

## 2017-09-23 DIAGNOSIS — R269 Unspecified abnormalities of gait and mobility: Secondary | ICD-10-CM | POA: Diagnosis not present

## 2017-09-23 DIAGNOSIS — M25674 Stiffness of right foot, not elsewhere classified: Secondary | ICD-10-CM | POA: Diagnosis not present

## 2017-09-23 DIAGNOSIS — M25571 Pain in right ankle and joints of right foot: Secondary | ICD-10-CM | POA: Diagnosis not present

## 2017-09-28 ENCOUNTER — Ambulatory Visit (INDEPENDENT_AMBULATORY_CARE_PROVIDER_SITE_OTHER): Payer: Medicare Other | Admitting: Family Medicine

## 2017-09-28 VITALS — BP 99/64 | HR 70 | Temp 97.5°F | Ht 64.0 in | Wt 208.0 lb

## 2017-09-28 DIAGNOSIS — M25571 Pain in right ankle and joints of right foot: Secondary | ICD-10-CM | POA: Diagnosis not present

## 2017-09-28 DIAGNOSIS — R269 Unspecified abnormalities of gait and mobility: Secondary | ICD-10-CM | POA: Diagnosis not present

## 2017-09-28 DIAGNOSIS — R7303 Prediabetes: Secondary | ICD-10-CM | POA: Diagnosis not present

## 2017-09-28 DIAGNOSIS — M25474 Effusion, right foot: Secondary | ICD-10-CM | POA: Diagnosis not present

## 2017-09-28 DIAGNOSIS — Z6835 Body mass index (BMI) 35.0-35.9, adult: Secondary | ICD-10-CM

## 2017-09-28 DIAGNOSIS — M25674 Stiffness of right foot, not elsewhere classified: Secondary | ICD-10-CM | POA: Diagnosis not present

## 2017-09-29 NOTE — Progress Notes (Signed)
Office: [phone]  /  Fax: [phone]   HPI:   Chief Complaint: OBESITY Heather [name] is here to discuss her progress with her obesity treatment plan. She is on the Category 2 plan and is following her eating plan approximately 50 % of the time. She states she is doing physical therapy 60 minutes 2 times per week. Heather [name] notes decreased motivation in the last few weeks. She continues to lose weight, but it is slower than expected. Her weight is 208 lb (94.3 kg) today and has had a weight loss of 1 pound over a period of 3 weeks since her last visit. She has lost 60 lbs since starting treatment with [name].  Pre-Diabetes Heather [name] has a diagnosis of prediabetes based on her elevated Hgb A1c and was informed this puts her at greater risk of developing diabetes. She is taking metformin currently and continues to work on diet and exercise to decrease risk of diabetes. Her A1c is greatly improved.  Heather [name] has decreased polyphagia and she denies nausea, vomiting or hypoglycemia.  ALLERGIES: Allergies  Allergen Reactions  . Aspirin   . Coconut Oil   . Latex   . Lisinopril     REACTION: cough  . Metoprolol Itching  . Peanut-Containing Drug Products   . Penicillins     REACTION: rash  . Prednisone   . Strawberry Extract     MEDICATIONS: Current Outpatient Medications on File Prior to Visit  Medication Sig Dispense Refill  . acetaminophen (TYLENOL) 500 MG tablet Take 650 mg by mouth 3 (three) times daily as needed for pain.     [name] ALLEGRA-D ALLERGY & CONGESTION 180-240 MG 24 hr tablet TAKE 1 TABLET BY MOUTH DAILY 30 tablet 0  . Calcium Carbonate-Vitamin D (CALCIUM 600 + D PO) Take 2 tablets by mouth daily.      . clopidogrel (PLAVIX) 75 MG tablet Take 1 tablet (75 mg total) by mouth daily. 90 tablet 3  . Ferrous Sulfate (IRON) 325 (65 FE) MG TABS Take 1 tablet by mouth daily.      . furosemide (LASIX) 40 MG tablet TAKE 1 TABLET EVERY DAY 90 tablet 3  . gabapentin (NEURONTIN) 100 MG capsule TAKE 2  CAPSULES(200 MG) BY MOUTH AT BEDTIME 180 capsule 1  . glucosamine-chondroitin 500-400 MG tablet Take 1 tablet by mouth 2 (two) times daily.      [name] KLOR-CON M20 20 MEQ tablet TAKE 1 TABLET EVERY DAY 90 tablet 3  . metFORMIN (GLUCOPHAGE-XR) 500 MG 24 hr tablet TAKE 2 TABLETS TWICE DAILY 360 tablet 3  . Multiple Vitamin (MULTIVITAMIN) tablet Take 1 tablet by mouth daily.      . naproxen sodium (ANAPROX) 220 MG tablet Take 220 mg by mouth as needed.    . Omega-3 Fatty Acids (FISH OIL) 1000 MG CAPS Take 1 capsule by mouth daily.    [name] omeprazole (PRILOSEC) 20 MG capsule Take 20 mg by mouth daily as needed.     . Probiotic Product (PROBIOTIC-10) CAPS Take 1 capsule by mouth daily.    . pseudoephedrine-acetaminophen (TYLENOL SINUS) 30-500 MG TABS tablet Take 1 tablet by mouth as needed.    . pyridOXINE (VITAMIN B-6) 100 MG tablet Take 200 mg by mouth daily.    . rosuvastatin (CRESTOR) 40 MG tablet TAKE 1 TABLET EVERY DAY 90 tablet 3  . SF 5000 PLUS 1.1 % CREA dental cream   0  . topiramate (TOPAMAX) 100 MG tablet TAKE 1 TABLET EVERY DAY 90 tablet 3  .  Turmeric 500 MG TABS Take 2 tablets by mouth daily.    [name] venlafaxine XR (EFFEXOR-XR) 37.5 MG 24 hr capsule Take 1 capsule (37.5 mg total) by mouth daily with breakfast. 90 capsule 1  . vitamin E 200 UNIT capsule Take 200 Units by mouth daily.     Current Facility-Administered Medications on File Prior to Visit  Medication Dose Route Frequency Provider Last Rate Last Dose  . 0.9 %  sodium chloride infusion  500 mL Intravenous Continuous Nandigam, Venia Minks, MD        PAST MEDICAL HISTORY: Past Medical History:  Diagnosis Date  . Abnormal Pap smear   . ALLERGIC RHINITIS 10/22/2006  . ANEMIA 12/18/2008  . Arthritis   . ASYMPTOMATIC POSTMENOPAUSAL STATUS 11/22/2007  . Back pain   . Complication of anesthesia   . Degenerative arthritis   . DIABETES MELLITUS, TYPE II 01/13/2007  . Dyslipidemia   . Fibroid   . GERD 07/20/2007  . GOITER, MULTINODULAR  07/20/2007  . Headache(784.0) 07/20/2007  . HEARING LOSS 11/22/2007  . Hiatal hernia   . Hx of colposcopy with cervical biopsy   . HYPERCHOLESTEROLEMIA 01/13/2007  . Hyperglycemia   . HYPERTENSION 10/22/2006  . NASH (nonalcoholic steatohepatitis)   . Nocturnal hypoxemia 02/17/2013  . Obesity   . Obstructive sleep apnea   . OSTEOARTHRITIS 10/22/2006  . Other chronic nonalcoholic liver disease 2/95/1884  . Sleep apnea     PAST SURGICAL HISTORY: Past Surgical History:  Procedure Laterality Date  . BREAST SURGERY     Breast reduction  . CATARACT EXTRACTION, BILATERAL    . DEXA  08/2005  . DILATION AND CURETTAGE OF UTERUS    . ELECTROCARDIOGRAM  10/15/2006  . ESOPHAGOGASTRODUODENOSCOPY  12/08/2005  . Stress Cardiolite  10/21/2005  . sweat gland removal    . WISDOM TOOTH EXTRACTION      SOCIAL HISTORY: Social History   Tobacco Use  . Smoking status: Former Smoker    Packs/day: 2.00    Years: 20.00    Pack years: 40.00    Types: Cigarettes    Last attempt to quit: 03/03/1979    Years since quitting: 38.6  . Smokeless tobacco: Never Used  Substance Use Topics  . Alcohol use: Yes    Comment: rare  . Drug use: No    FAMILY HISTORY: Family History  Problem Relation Age of Onset  . Asthma Mother   . Depression Mother   . Bipolar disorder Mother   . Dementia Mother   . Arthritis Mother   . Breast cancer Mother        primary  . Colon cancer Mother        mets from breast  . Cancer Mother   . Hypertension Father   . Migraines Father   . Cancer Maternal Aunt   . Heart disease Maternal Grandmother   . Cancer Maternal Grandfather     ROS: Review of Systems  Constitutional: Positive for weight loss.  Gastrointestinal: Negative for nausea and vomiting.  Endo/Heme/Allergies:       Positive for polyphagia Negative for hypoglycemia    PHYSICAL EXAM: Blood pressure 99/64, pulse 70, temperature (!) 97.5 F (36.4 C), temperature source Oral, height 5' 4"  (1.626 m),  weight 208 lb (94.3 kg), SpO2 99 %. Body mass index is 35.7 kg/m. Physical Exam  Constitutional: She is oriented to person, place, and time. She appears well-developed and well-nourished.  Cardiovascular: Normal rate.  Pulmonary/Chest: Effort normal.  Musculoskeletal: Normal range of motion.  Neurological: She is oriented to person, place, and time.  Skin: Skin is warm and dry.  Psychiatric: She has a normal mood and affect. Her behavior is normal.  Vitals reviewed.   RECENT LABS AND TESTS: BMET    Component Value Date/Time   NA 140 07/29/2017 1040   K 3.8 07/29/2017 1040   CL 102 07/29/2017 1040   CO2 24 07/29/2017 1040   GLUCOSE 90 07/29/2017 1040   GLUCOSE 116 (H) 03/05/2016 0958   BUN 17 07/29/2017 1040   CREATININE 0.78 07/29/2017 1040   CREATININE 0.81 12/28/2011 0853   CALCIUM 9.5 07/29/2017 1040   GFRNONAA 77 07/29/2017 1040   GFRAA 89 07/29/2017 1040   Lab Results  Component Value Date   HGBA1C 5.7 (H) 07/29/2017   HGBA1C 5.9 03/15/2017   HGBA1C 6.3 (H) 12/23/2016   HGBA1C 6.3 09/08/2016   HGBA1C 6.4 03/05/2016   Lab Results  Component Value Date   INSULIN 6.8 07/29/2017   INSULIN 20.9 12/23/2016   CBC    Component Value Date/Time   WBC 4.9 12/23/2016 0956   WBC 7.2 03/05/2016 0958   RBC 4.32 12/23/2016 0956   RBC 4.46 03/05/2016 0958   HGB 12.1 12/23/2016 0956   HCT 37.4 12/23/2016 0956   PLT 300.0 03/05/2016 0958   MCV 87 12/23/2016 0956   MCH 28.0 12/23/2016 0956   MCH 29.4 12/28/2011 0853   MCHC 32.4 12/23/2016 0956   MCHC 32.9 03/05/2016 0958   RDW 17.5 (H) 12/23/2016 0956   LYMPHSABS 1.2 12/23/2016 0956   MONOABS 0.4 03/05/2016 0958   EOSABS 0.1 12/23/2016 0956   BASOSABS 0.0 12/23/2016 0956   Iron/TIBC/Ferritin/ %Sat    Component Value Date/Time   IRON 38 12/23/2016 0956   TIBC 271 12/23/2016 0956   FERRITIN 40 12/23/2016 0956   IRONPCTSAT 14 (L) 12/23/2016 0956   Lipid Panel     Component Value Date/Time   CHOL 195  07/29/2017 1040   TRIG 68 07/29/2017 1040   HDL 72 07/29/2017 1040   CHOLHDL 3 03/05/2016 0958   VLDL 26.0 03/05/2016 0958   LDLCALC 109 (H) 07/29/2017 1040   Hepatic Function Panel     Component Value Date/Time   PROT 7.5 07/29/2017 1040   ALBUMIN 4.3 07/29/2017 1040   AST 15 07/29/2017 1040   ALT 10 07/29/2017 1040   ALKPHOS 71 07/29/2017 1040   BILITOT <0.2 07/29/2017 1040   BILIDIR 0.1 02/15/2015 1010   IBILI 0.2 12/28/2011 0853      Component Value Date/Time   TSH 1.170 12/23/2016 0956   TSH 1.76 03/05/2016 0958   TSH 1.86 02/15/2015 1010   Results for SATSUKI, ZILLMER (MRN [id number]) as of 09/29/2017 07:53  Ref. Range 07/29/2017 10:40  Vitamin D, 25-Hydroxy Latest Ref Range: 30.0 - 100.0 ng/mL 40.7   ASSESSMENT AND PLAN: Prediabetes  Class 2 severe obesity with serious comorbidity and body mass index (BMI) of 35.0 to 35.9 in adult, unspecified obesity type (Heather [name])  PLAN:  Pre-Diabetes Heather [name] will continue to work on weight loss, exercise, and decreasing simple carbohydrates in her diet to help decrease the risk of diabetes. We dicussed metformin including benefits and risks. She was informed that eating too many simple carbohydrates or too many calories at one sitting increases the likelihood of GI side effects. We will recheck labs in 1 month and Heather [name] will follow up with [name] as directed to monitor her progress.  We spent > than 50% of the 30 minute visit  on the counseling as documented in the note.  Obesity Heather [name] is currently in the action stage of change. As such, her goal is to continue with weight loss efforts She has agreed to lower carbohydrate, vegetable and lean protein rich diet plan Heather [name] has been instructed to work up to a goal of 150 minutes of combined cardio and strengthening exercise per week for weight loss and overall health benefits. We discussed the following Behavioral Modification Strategies today: decreasing simple carbohydrates , work on meal  planning and easy cooking plans and dealing with family or coworker sabotage  Heather [name] has agreed to follow up with our clinic in 2 to 3 weeks. She was informed of the importance of frequent follow up visits to maximize her success with intensive lifestyle modifications for her multiple health conditions.   OBESITY BEHAVIORAL INTERVENTION VISIT  Today's visit was # 16 out of 22.  Starting weight: 268 lbs Starting date: 12/23/16 Today's weight : 208 lbs  Today's date: 09/28/2017 Total lbs lost to date: 25    ASK: We discussed the diagnosis of obesity with Heather [name] today and Heather [name] agreed to give [name] permission to discuss obesity behavioral modification therapy today.  ASSESS: Heather [name] has the diagnosis of obesity and her BMI today is 35.69 Heather [name] is in the action stage of change   ADVISE: Heather [name] was educated on the multiple health risks of obesity as well as the benefit of weight loss to improve her health. She was advised of the need for long term treatment and the importance of lifestyle modifications.  AGREE: Multiple dietary modification options and treatment options were discussed and  Heather [name] agreed to the above obesity treatment plan.  I, Doreene Nest, am acting as transcriptionist for [name], MD  I have reviewed the above documentation for accuracy and completeness, and I agree with the above. -[name], MD

## 2017-09-30 DIAGNOSIS — M25571 Pain in right ankle and joints of right foot: Secondary | ICD-10-CM | POA: Diagnosis not present

## 2017-09-30 DIAGNOSIS — M25674 Stiffness of right foot, not elsewhere classified: Secondary | ICD-10-CM | POA: Diagnosis not present

## 2017-09-30 DIAGNOSIS — M25474 Effusion, right foot: Secondary | ICD-10-CM | POA: Diagnosis not present

## 2017-09-30 DIAGNOSIS — R269 Unspecified abnormalities of gait and mobility: Secondary | ICD-10-CM | POA: Diagnosis not present

## 2017-10-05 DIAGNOSIS — M25571 Pain in right ankle and joints of right foot: Secondary | ICD-10-CM | POA: Diagnosis not present

## 2017-10-05 DIAGNOSIS — R269 Unspecified abnormalities of gait and mobility: Secondary | ICD-10-CM | POA: Diagnosis not present

## 2017-10-05 DIAGNOSIS — M25674 Stiffness of right foot, not elsewhere classified: Secondary | ICD-10-CM | POA: Diagnosis not present

## 2017-10-05 DIAGNOSIS — M25474 Effusion, right foot: Secondary | ICD-10-CM | POA: Diagnosis not present

## 2017-10-07 DIAGNOSIS — R269 Unspecified abnormalities of gait and mobility: Secondary | ICD-10-CM | POA: Diagnosis not present

## 2017-10-07 DIAGNOSIS — M25571 Pain in right ankle and joints of right foot: Secondary | ICD-10-CM | POA: Diagnosis not present

## 2017-10-07 DIAGNOSIS — M25474 Effusion, right foot: Secondary | ICD-10-CM | POA: Diagnosis not present

## 2017-10-07 DIAGNOSIS — M25674 Stiffness of right foot, not elsewhere classified: Secondary | ICD-10-CM | POA: Diagnosis not present

## 2017-10-12 ENCOUNTER — Ambulatory Visit (INDEPENDENT_AMBULATORY_CARE_PROVIDER_SITE_OTHER): Payer: Medicare Other

## 2017-10-12 ENCOUNTER — Encounter: Payer: Self-pay | Admitting: Podiatry

## 2017-10-12 ENCOUNTER — Ambulatory Visit (INDEPENDENT_AMBULATORY_CARE_PROVIDER_SITE_OTHER): Payer: Medicare Other | Admitting: Podiatry

## 2017-10-12 DIAGNOSIS — M2041 Other hammer toe(s) (acquired), right foot: Secondary | ICD-10-CM | POA: Diagnosis not present

## 2017-10-12 DIAGNOSIS — I2510 Atherosclerotic heart disease of native coronary artery without angina pectoris: Secondary | ICD-10-CM | POA: Diagnosis not present

## 2017-10-12 DIAGNOSIS — M2011 Hallux valgus (acquired), right foot: Secondary | ICD-10-CM | POA: Diagnosis not present

## 2017-10-12 DIAGNOSIS — T847XXA Infection and inflammatory reaction due to other internal orthopedic prosthetic devices, implants and grafts, initial encounter: Secondary | ICD-10-CM

## 2017-10-12 DIAGNOSIS — Z9889 Other specified postprocedural states: Secondary | ICD-10-CM

## 2017-10-12 MED ORDER — DOXYCYCLINE HYCLATE 100 MG PO TABS: 100.0000 mg | ORAL_TABLET | Freq: Two times a day (BID) | ORAL | 0 refills | Status: DC

## 2017-10-12 NOTE — Patient Instructions (Signed)
Pre-Operative Instructions  Congratulations, you have decided to take an important step towards improving your quality of life.  You can be assured that the doctors and staff at [facility] will be with you every step of the way.  Here are some important things you should know:  1. Plan to be at the surgery center/hospital at least 1 (one) hour prior to your scheduled time, unless otherwise directed by the surgical center/hospital staff.  You must have a responsible adult accompany you, remain during the surgery and drive you home.  Make sure you have directions to the surgical center/hospital to ensure you arrive on time. 2. If you are having surgery at [facility] or [facility] hospitals, you will need a copy of your medical history and physical form from your family physician within one month prior to the date of surgery. We will give you a form for your primary physician to complete.  3. We make every effort to accommodate the date you request for surgery.  However, there are times where surgery dates or times have to be moved.  We will contact you as soon as possible if a change in schedule is required.   4. No aspirin/ibuprofen for one week before surgery.  If you are on aspirin, any non-steroidal anti-inflammatory medications (Mobic, Aleve, Ibuprofen) should not be taken seven (7) days prior to your surgery.  You make take Tylenol for pain prior to surgery.  5. Medications - If you are taking daily heart and blood pressure medications, seizure, reflux, allergy, asthma, anxiety, pain or diabetes medications, make sure you notify the surgery center/hospital before the day of surgery so they can tell you which medications you should take or avoid the day of surgery. 6. No food or drink after midnight the night before surgery unless directed otherwise by surgical center/hospital staff. 7. No alcoholic beverages 24-hours prior to surgery.  No smoking 24-hours prior or 24-hours after  surgery. 8. Wear loose pants or shorts. They should be loose enough to fit over bandages, boots, and casts. 9. Don't wear slip-on shoes. Sneakers are preferred. 10. Bring your boot with you to the surgery center/hospital.  Also bring crutches or a walker if your physician has prescribed it for you.  If you do not have this equipment, it will be provided for you after surgery. 11. If you have not been contacted by the surgery center/hospital by the day before your surgery, call to confirm the date and time of your surgery. 12. Leave-time from work may vary depending on the type of surgery you have.  Appropriate arrangements should be made prior to surgery with your employer. 13. Prescriptions will be provided immediately following surgery by your doctor.  Fill these as soon as possible after surgery and take the medication as directed. Pain medications will not be refilled on weekends and must be approved by the doctor. 14. Remove nail polish on the operative foot and avoid getting pedicures prior to surgery. 15. Wash the night before surgery.  The night before surgery wash the foot and leg well with water and the antibacterial soap provided. Be sure to pay special attention to beneath the toenails and in between the toes.  Wash for at least three (3) minutes. Rinse thoroughly with water and dry well with a towel.  Perform this wash unless told not to do so by your physician.  Enclosed: 1 Ice pack (please put in freezer the night before surgery)   1 Hibiclens skin cleaner     Pre-op instructions  If you have any questions regarding the instructions, please do not hesitate to call our office.  [place]: [address], [place], [place] [address] -- [phone]  [place]: [address] [place], [place] [address] -- [phone]  [place]: [address]  [place], [place] [address] -- [phone]  [place]: [address], [address], [place], [place] [address] -- [phone]  Website:  [email or web address] 

## 2017-10-12 NOTE — Progress Notes (Signed)
She presents today date of surgery June 04, 2017 status post [facility] bunionectomy right foot and hammertoe repair second digit right foot.  States that I was wearing shoes to church just the other day and wants to return home to wear shoes off I found the my toe was bleeding and that it was draining.  Objective: Vital signs are stable she is alert and oriented x3.  There is no erythema cellulitis or odor just mild edema and some serosanguineous drainage from underneath the toenail plate.  It is moderately tender on palpation.  Radiographs demonstrate a fracture of the dorsal cortex of the distal phalanx allowing for the screw to become dorsiflexed or better yet this DIPJ to plantarflex pushing the screw through the top of the toe.  Assessment: Painful internal fixation with mild cellulitis.  Plan: Start her on doxycycline signed a consent today for removal of internal fixation.  We plan to do this this Friday at 6:30 in the morning

## 2017-10-15 ENCOUNTER — Encounter: Payer: Self-pay | Admitting: Podiatry

## 2017-10-15 DIAGNOSIS — K219 Gastro-esophageal reflux disease without esophagitis: Secondary | ICD-10-CM | POA: Diagnosis not present

## 2017-10-15 DIAGNOSIS — T8484XA Pain due to internal orthopedic prosthetic devices, implants and grafts, initial encounter: Secondary | ICD-10-CM | POA: Diagnosis not present

## 2017-10-15 DIAGNOSIS — Z472 Encounter for removal of internal fixation device: Secondary | ICD-10-CM | POA: Diagnosis not present

## 2017-10-15 DIAGNOSIS — Z4889 Encounter for other specified surgical aftercare: Secondary | ICD-10-CM | POA: Diagnosis not present

## 2017-10-18 ENCOUNTER — Telehealth: Payer: Self-pay | Admitting: *Deleted

## 2017-10-18 NOTE — Telephone Encounter (Signed)
LEFT MESSAGE FOR PT TO CALL IF SHE HAD CONCERNS

## 2017-10-19 ENCOUNTER — Ambulatory Visit (INDEPENDENT_AMBULATORY_CARE_PROVIDER_SITE_OTHER): Payer: Medicare Other | Admitting: Family Medicine

## 2017-10-19 VITALS — BP 101/64 | HR 68 | Temp 98.5°F | Ht 64.0 in | Wt 207.0 lb

## 2017-10-19 DIAGNOSIS — R7303 Prediabetes: Secondary | ICD-10-CM

## 2017-10-19 DIAGNOSIS — Z6835 Body mass index (BMI) 35.0-35.9, adult: Secondary | ICD-10-CM | POA: Diagnosis not present

## 2017-10-19 NOTE — Progress Notes (Signed)
Office: [phone]  /  Fax: [phone]   HPI:   Chief Complaint: OBESITY Heather [name] is here to discuss her progress with her obesity treatment plan. She is on the lower carbohydrate, vegetable and lean protein rich diet plan and is following her eating plan approximately 90 % of the time. She states she is exercising 0 minutes 0 times per week. Aphrodite changed to a low carb plan, and she did well with weight loss until she had foot surgery. She is getting ready to travel to see her grandchildren. She is still getting good support form her husband, who comes to every visit. Her weight is 207 lb (93.9 kg) today and has had a weight loss of 1 pound over a period of 3 weeks since her last visit. She has lost 61 lbs since starting treatment with [name].  Pre-Diabetes Heather [name] has a diagnosis of prediabetes based on her elevated Hgb A1c and was informed this puts her at greater risk of developing diabetes. She is doing well with diet and decreasing simple carbohydrates. She is tolerating metformin well and denies nausea, vomiting or hypoglycemia. Heather [name] continues to work on diet to decrease risk of diabetes.   ALLERGIES: Allergies  Allergen Reactions  . Aspirin   . Coconut Oil   . Latex   . Lisinopril     REACTION: cough  . Metoprolol Itching  . Peanut-Containing Drug Products   . Penicillins     REACTION: rash  . Prednisone   . Strawberry Extract     MEDICATIONS: Current Outpatient Medications on File Prior to Visit  Medication Sig Dispense Refill  . acetaminophen (TYLENOL) 500 MG tablet Take 650 mg by mouth 3 (three) times daily as needed for pain.     [name] ALLEGRA-D ALLERGY & CONGESTION 180-240 MG 24 hr tablet TAKE 1 TABLET BY MOUTH DAILY 30 tablet 0  . Calcium Carbonate-Vitamin D (CALCIUM 600 + D PO) Take 2 tablets by mouth daily.      . clopidogrel (PLAVIX) 75 MG tablet Take 1 tablet (75 mg total) by mouth daily. 90 tablet 3  . doxycycline (VIBRA-TABS) 100 MG tablet Take 1 tablet (100 mg  total) by mouth 2 (two) times daily. 20 tablet 0  . Ferrous Sulfate (IRON) 325 (65 FE) MG TABS Take 1 tablet by mouth daily.      . Fluticasone-Salmeterol (ADVAIR DISKUS) 100-50 MCG/DOSE AEPB Advair Diskus 100 mcg-50 mcg/dose powder for inhalation    . furosemide (LASIX) 40 MG tablet TAKE 1 TABLET EVERY DAY 90 tablet 3  . gabapentin (NEURONTIN) 100 MG capsule TAKE 2 CAPSULES(200 MG) BY MOUTH AT BEDTIME 180 capsule 1  . glucosamine-chondroitin 500-400 MG tablet Take 1 tablet by mouth 2 (two) times daily.      [name] KLOR-CON M20 20 MEQ tablet TAKE 1 TABLET EVERY DAY 90 tablet 3  . losartan (COZAAR) 25 MG tablet TAKE 1 TABLET EVERY DAY    . metFORMIN (GLUCOPHAGE-XR) 500 MG 24 hr tablet TAKE 2 TABLETS TWICE DAILY 360 tablet 3  . modafinil (PROVIGIL) 200 MG tablet modafinil 200 mg tablet    . Multiple Vitamin (MULTIVITAMIN) tablet Take 1 tablet by mouth daily.      . naproxen sodium (ANAPROX) 220 MG tablet Take 220 mg by mouth as needed.    . Omega-3 Fatty Acids (FISH OIL) 1000 MG CAPS Take 1 capsule by mouth daily.    [name] omeprazole (PRILOSEC) 20 MG capsule Take 20 mg by mouth daily as needed.     [name]  Probiotic Product (PROBIOTIC-10) CAPS Take 1 capsule by mouth daily.    . pseudoephedrine-acetaminophen (TYLENOL SINUS) 30-500 MG TABS tablet Take 1 tablet by mouth as needed.    . pyridOXINE (VITAMIN B-6) 100 MG tablet Take 200 mg by mouth daily.    . rosuvastatin (CRESTOR) 40 MG tablet TAKE 1 TABLET EVERY DAY 90 tablet 3  . SF 5000 PLUS 1.1 % CREA dental cream   0  . Sod Fluoride-Potassium Nitrate (PREVIDENT 5000 SENSITIVE) 1.1-5 % PSTE PreviDent 5000 Sensitive 1.1 %-5 % dental paste    . SUMAtriptan (IMITREX) 20 MG/ACT nasal spray sumatriptan 20 mg/actuation nasal spray    . topiramate (TOPAMAX) 100 MG tablet TAKE 1 TABLET EVERY DAY 90 tablet 3  . traMADol (ULTRAM) 50 MG tablet tramadol 50 mg tablet    . triamcinolone cream (KENALOG) 0.1 % triamcinolone acetonide 0.1 % topical cream    . Turmeric 500 MG  TABS Take 2 tablets by mouth daily.    [name] venlafaxine XR (EFFEXOR-XR) 37.5 MG 24 hr capsule Take 1 capsule (37.5 mg total) by mouth daily with breakfast. 90 capsule 1  . vitamin E 200 UNIT capsule Take 200 Units by mouth daily.    [name] Zoster Vaccine Live, PF, (ZOSTAVAX) 50539 UNT/0.65ML injection Zostavax (PF) 19,400 unit/0.65 mL subcutaneous suspension     Current Facility-Administered Medications on File Prior to Visit  Medication Dose Route Frequency Provider Last Rate Last Dose  . 0.9 %  sodium chloride infusion  500 mL Intravenous Continuous Nandigam, Venia Minks, MD        PAST MEDICAL HISTORY: Past Medical History:  Diagnosis Date  . Abnormal Pap smear   . ALLERGIC RHINITIS 10/22/2006  . ANEMIA 12/18/2008  . Arthritis   . ASYMPTOMATIC POSTMENOPAUSAL STATUS 11/22/2007  . Back pain   . Complication of anesthesia   . Degenerative arthritis   . DIABETES MELLITUS, TYPE II 01/13/2007  . Dyslipidemia   . Fibroid   . GERD 07/20/2007  . GOITER, MULTINODULAR 07/20/2007  . Headache(784.0) 07/20/2007  . HEARING LOSS 11/22/2007  . Hiatal hernia   . Hx of colposcopy with cervical biopsy   . HYPERCHOLESTEROLEMIA 01/13/2007  . Hyperglycemia   . HYPERTENSION 10/22/2006  . NASH (nonalcoholic steatohepatitis)   . Nocturnal hypoxemia 02/17/2013  . Obesity   . Obstructive sleep apnea   . OSTEOARTHRITIS 10/22/2006  . Other chronic nonalcoholic liver disease 7/67/3419  . Sleep apnea     PAST SURGICAL HISTORY: Past Surgical History:  Procedure Laterality Date  . BREAST SURGERY     Breast reduction  . CATARACT EXTRACTION, BILATERAL    . DEXA  08/2005  . DILATION AND CURETTAGE OF UTERUS    . ELECTROCARDIOGRAM  10/15/2006  . ESOPHAGOGASTRODUODENOSCOPY  12/08/2005  . Stress Cardiolite  10/21/2005  . sweat gland removal    . WISDOM TOOTH EXTRACTION      SOCIAL HISTORY: Social History   Tobacco Use  . Smoking status: Former Smoker    Packs/day: 2.00    Years: 20.00    Pack years: 40.00     Types: Cigarettes    Last attempt to quit: 03/03/1979    Years since quitting: 38.6  . Smokeless tobacco: Never Used  Substance Use Topics  . Alcohol use: Yes    Comment: rare  . Drug use: No    FAMILY HISTORY: Family History  Problem Relation Age of Onset  . Asthma Mother   . Depression Mother   . Bipolar disorder Mother   .  Dementia Mother   . Arthritis Mother   . Breast cancer Mother        primary  . Colon cancer Mother        mets from breast  . Cancer Mother   . Hypertension Father   . Migraines Father   . Cancer Maternal Aunt   . Heart disease Maternal Grandmother   . Cancer Maternal Grandfather     ROS: Review of Systems  Constitutional: Negative for weight loss.  Gastrointestinal: Negative for nausea and vomiting.  Endo/Heme/Allergies:       Negative for hypoglycemia    PHYSICAL EXAM: Blood pressure 101/64, pulse 68, temperature 98.5 F (36.9 C), temperature source Oral, height 5' 4"  (1.626 m), weight 207 lb (93.9 kg), SpO2 100 %. Body mass index is 35.53 kg/m. Physical Exam  Constitutional: She is oriented to person, place, and time. She appears well-developed and well-nourished.  Cardiovascular: Normal rate.  Pulmonary/Chest: Effort normal.  Musculoskeletal: Normal range of motion.  Neurological: She is oriented to person, place, and time.  Skin: Skin is warm and dry.  Psychiatric: She has a normal mood and affect. Her behavior is normal.  Vitals reviewed.   RECENT LABS AND TESTS: BMET    Component Value Date/Time   NA 140 07/29/2017 1040   K 3.8 07/29/2017 1040   CL 102 07/29/2017 1040   CO2 24 07/29/2017 1040   GLUCOSE 90 07/29/2017 1040   GLUCOSE 116 (H) 03/05/2016 0958   BUN 17 07/29/2017 1040   CREATININE 0.78 07/29/2017 1040   CREATININE 0.81 12/28/2011 0853   CALCIUM 9.5 07/29/2017 1040   GFRNONAA 77 07/29/2017 1040   GFRAA 89 07/29/2017 1040   Lab Results  Component Value Date   HGBA1C 5.7 (H) 07/29/2017   HGBA1C 5.9  03/15/2017   HGBA1C 6.3 (H) 12/23/2016   HGBA1C 6.3 09/08/2016   HGBA1C 6.4 03/05/2016   Lab Results  Component Value Date   INSULIN 6.8 07/29/2017   INSULIN 20.9 12/23/2016   CBC    Component Value Date/Time   WBC 4.9 12/23/2016 0956   WBC 7.2 03/05/2016 0958   RBC 4.32 12/23/2016 0956   RBC 4.46 03/05/2016 0958   HGB 12.1 12/23/2016 0956   HCT 37.4 12/23/2016 0956   PLT 300.0 03/05/2016 0958   MCV 87 12/23/2016 0956   MCH 28.0 12/23/2016 0956   MCH 29.4 12/28/2011 0853   MCHC 32.4 12/23/2016 0956   MCHC 32.9 03/05/2016 0958   RDW 17.5 (H) 12/23/2016 0956   LYMPHSABS 1.2 12/23/2016 0956   MONOABS 0.4 03/05/2016 0958   EOSABS 0.1 12/23/2016 0956   BASOSABS 0.0 12/23/2016 0956   Iron/TIBC/Ferritin/ %Sat    Component Value Date/Time   IRON 38 12/23/2016 0956   TIBC 271 12/23/2016 0956   FERRITIN 40 12/23/2016 0956   IRONPCTSAT 14 (L) 12/23/2016 0956   Lipid Panel     Component Value Date/Time   CHOL 195 07/29/2017 1040   TRIG 68 07/29/2017 1040   HDL 72 07/29/2017 1040   CHOLHDL 3 03/05/2016 0958   VLDL 26.0 03/05/2016 0958   LDLCALC 109 (H) 07/29/2017 1040   Hepatic Function Panel     Component Value Date/Time   PROT 7.5 07/29/2017 1040   ALBUMIN 4.3 07/29/2017 1040   AST 15 07/29/2017 1040   ALT 10 07/29/2017 1040   ALKPHOS 71 07/29/2017 1040   BILITOT <0.2 07/29/2017 1040   BILIDIR 0.1 02/15/2015 1010   IBILI 0.2 12/28/2011 0853  Component Value Date/Time   TSH 1.170 12/23/2016 0956   TSH 1.76 03/05/2016 0958   TSH 1.86 02/15/2015 1010   Results for MEMPHIS, CRESWELL (MRN [id number]) as of 10/19/2017 14:38  Ref. Range 07/29/2017 10:40  Vitamin D, 25-Hydroxy Latest Ref Range: 30.0 - 100.0 ng/mL 40.7   ASSESSMENT AND PLAN: Prediabetes  Class 2 severe obesity with serious comorbidity and body mass index (BMI) of 35.0 to 35.9 in adult, unspecified obesity type ([place])  PLAN:  Pre-Diabetes Heather [name] will continue to work on weight loss and  decreasing simple carbohydrates in her diet to help decrease the risk of diabetes. We dicussed metformin including benefits and risks. She was informed that eating too many simple carbohydrates or too many calories at one sitting increases the likelihood of GI side effects. Heather [name] is to continue metformin and diet and we will restart exercise after her foot heals. Heather [name] agreed to follow up with [name] as directed to monitor her progress.  We spent > than 50% of the 15 minute visit on the counseling as documented in the note.  Obesity Heather [name] is currently in the action stage of change. As such, her goal is to continue with weight loss efforts She has agreed to follow a lower carbohydrate, vegetable and lean protein rich diet plan Heather [name] has been instructed to work up to a goal of 150 minutes of combined cardio and strengthening exercise per week for weight loss and overall health benefits. We discussed the following Behavioral Modification Strategies today: increasing lean protein intake, dealing with family or coworker sabotage and travel eating strategies   Heather [name] has agreed to follow up with our clinic in 2 to 3 weeks. She was informed of the importance of frequent follow up visits to maximize her success with intensive lifestyle modifications for her multiple health conditions.   OBESITY BEHAVIORAL INTERVENTION VISIT  Today's visit was # 17   Starting weight: 268 lbs Starting date: 12/23/16 Today's weight : 207 lbs  Today's date: 10/19/2017 Total lbs lost to date: 32 At least 15 minutes were spent on discussing the following behavioral intervention visit.   ASK: We discussed the diagnosis of obesity with Heather [name] today and Heather [name] agreed to give [name] permission to discuss obesity behavioral modification therapy today.  ASSESS: Heather [name] has the diagnosis of obesity and her BMI today is 35.51 Heather [name] is in the action stage of change   ADVISE: Heather [name] was educated on the multiple health risks of  obesity as well as the benefit of weight loss to improve her health. She was advised of the need for long term treatment and the importance of lifestyle modifications to improve her current health and to decrease her risk of future health problems.  AGREE: Multiple dietary modification options and treatment options were discussed and  Heather [name] agreed to follow the recommendations documented in the above note.  ARRANGE: Heather [name] was educated on the importance of frequent visits to treat obesity as outlined per CMS and USPSTF guidelines and agreed to schedule her next follow up appointment today.  I, Doreene Nest, am acting as transcriptionist for [name], MD  I have reviewed the above documentation for accuracy and completeness, and I agree with the above. -[name], MD

## 2017-10-20 ENCOUNTER — Other Ambulatory Visit: Payer: Self-pay | Admitting: Endocrinology

## 2017-10-21 ENCOUNTER — Other Ambulatory Visit: Payer: Medicare Other

## 2017-10-28 ENCOUNTER — Ambulatory Visit (INDEPENDENT_AMBULATORY_CARE_PROVIDER_SITE_OTHER): Payer: Medicare Other

## 2017-10-28 ENCOUNTER — Ambulatory Visit: Payer: Medicare Other

## 2017-10-28 DIAGNOSIS — M2041 Other hammer toe(s) (acquired), right foot: Secondary | ICD-10-CM

## 2017-10-28 DIAGNOSIS — M2011 Hallux valgus (acquired), right foot: Secondary | ICD-10-CM

## 2017-10-28 DIAGNOSIS — Z9889 Other specified postprocedural states: Secondary | ICD-10-CM

## 2017-11-03 ENCOUNTER — Encounter: Payer: Self-pay | Admitting: Family Medicine

## 2017-11-03 NOTE — Progress Notes (Signed)
Patient is here today for follow-up appointment, date of surgery 10/15/2017, procedure: Removal screw fixation second toe right foot.  She is had very minimal pain and states that overall her foot is feeling good.  Noted well-healing surgical sites, no redness, no erythema, no drainage, no other signs and symptoms of infection.  Swelling very minimal.  Sutures are removed wound edges coapted with no gapping.  Patient is to remain in surgical shoe, follow-up in 2 weeks with Dr. Milinda Pointer.  At this point she can get her foot wet, we did discuss signs and symptoms of infection and importance of reporting symptoms immediately.

## 2017-11-04 ENCOUNTER — Ambulatory Visit (INDEPENDENT_AMBULATORY_CARE_PROVIDER_SITE_OTHER)
Admission: RE | Admit: 2017-11-04 | Discharge: 2017-11-04 | Disposition: A | Discharge status: Home / Self Care | Payer: Medicare Other | Source: Ambulatory Visit | Attending: Family Medicine | Admitting: Family Medicine

## 2017-11-04 ENCOUNTER — Ambulatory Visit (INDEPENDENT_AMBULATORY_CARE_PROVIDER_SITE_OTHER): Payer: Medicare Other | Admitting: Family Medicine

## 2017-11-04 ENCOUNTER — Encounter: Payer: Self-pay | Admitting: Family Medicine

## 2017-11-04 VITALS — BP 102/58 | HR 66 | Ht 64.0 in | Wt 206.0 lb

## 2017-11-04 DIAGNOSIS — I2510 Atherosclerotic heart disease of native coronary artery without angina pectoris: Secondary | ICD-10-CM | POA: Diagnosis not present

## 2017-11-04 DIAGNOSIS — M5136 Other intervertebral disc degeneration, lumbar region: Secondary | ICD-10-CM

## 2017-11-04 DIAGNOSIS — M47816 Spondylosis without myelopathy or radiculopathy, lumbar region: Secondary | ICD-10-CM | POA: Diagnosis not present

## 2017-11-04 DIAGNOSIS — M545 Low back pain, unspecified: Secondary | ICD-10-CM

## 2017-11-04 DIAGNOSIS — M170 Bilateral primary osteoarthritis of knee: Secondary | ICD-10-CM | POA: Diagnosis not present

## 2017-11-04 NOTE — Progress Notes (Signed)
[name] [address], [place] [address] Phone: [phone] Subjective:   [name], am serving as a scribe for Dr. Hulan Saas.    CC: Knee pain  PIR:[id number]  Heather [name] is a 72 y.o. female coming in with complaint of knee pain. She had foot surgery in April and August. Patient has been compensating due to the foot surgeries. Pain throughout the joint but mostly in the front of knee. Now has low back pain as well due to the foot. Denies any radiating pain.  Bilateral.  Having significant difficulty again with walking.  Patient rates the severity pain is 7 out of 10.  Worsening symptoms overall.       Past Medical History:  Diagnosis Date  . Abnormal Pap smear   . ALLERGIC RHINITIS 10/22/2006  . ANEMIA 12/18/2008  . Arthritis   . ASYMPTOMATIC POSTMENOPAUSAL STATUS 11/22/2007  . Back pain   . Complication of anesthesia   . Degenerative arthritis   . DIABETES MELLITUS, TYPE II 01/13/2007  . Dyslipidemia   . Fibroid   . GERD 07/20/2007  . GOITER, MULTINODULAR 07/20/2007  . Headache(784.0) 07/20/2007  . HEARING LOSS 11/22/2007  . Hiatal hernia   . Hx of colposcopy with cervical biopsy   . HYPERCHOLESTEROLEMIA 01/13/2007  . Hyperglycemia   . HYPERTENSION 10/22/2006  . NASH (nonalcoholic steatohepatitis)   . Nocturnal hypoxemia 02/17/2013  . Obesity   . Obstructive sleep apnea   . OSTEOARTHRITIS 10/22/2006  . Other chronic nonalcoholic liver disease 04/30/6008  . Sleep apnea    Past Surgical History:  Procedure Laterality Date  . BREAST SURGERY     Breast reduction  . CATARACT EXTRACTION, BILATERAL    . DEXA  08/2005  . DILATION AND CURETTAGE OF UTERUS    . ELECTROCARDIOGRAM  10/15/2006  . ESOPHAGOGASTRODUODENOSCOPY  12/08/2005  . Stress Cardiolite  10/21/2005  . sweat gland removal    . WISDOM TOOTH EXTRACTION     Social History   Socioeconomic History  . Marital status: Married    Spouse name: [name]  .  Number of children: 2  . Years of education: College  . Highest education level: Not on file  Occupational History  . Occupation: Retired  Scientific laboratory technician  . Financial resource strain: Not on file  . Food insecurity:    Worry: Not on file    Inability: Not on file  . Transportation needs:    Medical: Not on file    Non-medical: Not on file  Tobacco Use  . Smoking status: Former Smoker    Packs/day: 2.00    Years: 20.00    Pack years: 40.00    Types: Cigarettes    Last attempt to quit: 03/03/1979    Years since quitting: 38.7  . Smokeless tobacco: Never Used  Substance and Sexual Activity  . Alcohol use: Yes    Comment: rare  . Drug use: No  . Sexual activity: Yes    Birth control/protection: Surgical  Lifestyle  . Physical activity:    Days per week: Not on file    Minutes per session: Not on file  . Stress: Not on file  Relationships  . Social connections:    Talks on phone: Not on file    Gets together: Not on file    Attends religious service: Not on file    Active member of club or organization: Not on file    Attends meetings of clubs or  organizations: Not on file    Relationship status: Not on file  Other Topics Concern  . Not on file  Social History Narrative   Patient is married [name]).   Patient has two children.   Patient is retired.   Patient has a college education.   Patient is right-handed.   Patient lives at home with family.   Caffeine Use: 2 soda every other day   Allergies  Allergen Reactions  . Aspirin   . Coconut Oil   . Latex   . Lisinopril     REACTION: cough  . Metoprolol Itching  . Peanut-Containing Drug Products   . Penicillins     REACTION: rash  . Prednisone   . Strawberry Extract    Family History  Problem Relation Age of Onset  . Asthma Mother   . Depression Mother   . Bipolar disorder Mother   . Dementia Mother   . Arthritis Mother   . Breast cancer Mother        primary  . Colon cancer Mother        mets from  breast  . Cancer Mother   . Hypertension Father   . Migraines Father   . Cancer Maternal Aunt   . Heart disease Maternal Grandmother   . Cancer Maternal Grandfather     Current Outpatient Medications (Endocrine & Metabolic):  .  metFORMIN (GLUCOPHAGE-XR) 500 MG 24 hr tablet, TAKE 2 TABLETS TWICE DAILY   Current Outpatient Medications (Cardiovascular):  .  furosemide (LASIX) 40 MG tablet, TAKE 1 TABLET EVERY DAY .  losartan (COZAAR) 25 MG tablet, TAKE 1 TABLET EVERY DAY .  rosuvastatin (CRESTOR) 40 MG tablet, TAKE 1 TABLET EVERY DAY   Current Outpatient Medications (Respiratory):  [name]  ALLEGRA-D ALLERGY & CONGESTION 180-240 MG 24 hr tablet, TAKE 1 TABLET BY MOUTH DAILY .  Fluticasone-Salmeterol (ADVAIR DISKUS) 100-50 MCG/DOSE AEPB, Advair Diskus 100 mcg-50 mcg/dose powder for inhalation .  pseudoephedrine-acetaminophen (TYLENOL SINUS) 30-500 MG TABS tablet, Take 1 tablet by mouth as needed.   Current Outpatient Medications (Analgesics):  .  acetaminophen (TYLENOL) 500 MG tablet, Take 650 mg by mouth 3 (three) times daily as needed for pain.  .  naproxen sodium (ANAPROX) 220 MG tablet, Take 220 mg by mouth as needed. .  SUMAtriptan (IMITREX) 20 MG/ACT nasal spray, sumatriptan 20 mg/actuation nasal spray .  traMADol (ULTRAM) 50 MG tablet, tramadol 50 mg tablet   Current Outpatient Medications (Hematological):  .  clopidogrel (PLAVIX) 75 MG tablet, Take 1 tablet (75 mg total) by mouth daily. .  Ferrous Sulfate (IRON) 325 (65 FE) MG TABS, Take 1 tablet by mouth daily.     Current Outpatient Medications (Other):  [name]  Calcium Carbonate-Vitamin D (CALCIUM 600 + D PO), Take 2 tablets by mouth daily.   [name]  doxycycline (VIBRA-TABS) 100 MG tablet, Take 1 tablet (100 mg total) by mouth 2 (two) times daily. [name]  gabapentin (NEURONTIN) 100 MG capsule, TAKE 2 CAPSULES(200 MG) BY MOUTH AT BEDTIME .  glucosamine-chondroitin 500-400 MG tablet, Take 1 tablet by mouth 2 (two) times daily.   [name]   KLOR-CON M20 20 MEQ tablet, TAKE 1 TABLET EVERY DAY .  modafinil (PROVIGIL) 200 MG tablet, modafinil 200 mg tablet .  Multiple Vitamin (MULTIVITAMIN) tablet, Take 1 tablet by mouth daily.   .  Omega-3 Fatty Acids (FISH OIL) 1000 MG CAPS, Take 1 capsule by mouth daily. [name]  omeprazole (PRILOSEC) 20 MG capsule, Take 20 mg by mouth daily  as needed.  .  Probiotic Product (PROBIOTIC-10) CAPS, Take 1 capsule by mouth daily. [name]  pyridOXINE (VITAMIN B-6) 100 MG tablet, Take 200 mg by mouth daily. .  SF 5000 PLUS 1.1 % CREA dental cream,  .  Sod Fluoride-Potassium Nitrate (PREVIDENT 5000 SENSITIVE) 1.1-5 % PSTE, PreviDent 5000 Sensitive 1.1 %-5 % dental paste .  topiramate (TOPAMAX) 100 MG tablet, TAKE 1 TABLET EVERY DAY .  triamcinolone cream (KENALOG) 0.1 %, triamcinolone acetonide 0.1 % topical cream .  Turmeric 500 MG TABS, Take 2 tablets by mouth daily. [name]  venlafaxine XR (EFFEXOR-XR) 37.5 MG 24 hr capsule, Take 1 capsule (37.5 mg total) by mouth daily with breakfast. .  vitamin E 200 UNIT capsule, Take 200 Units by mouth daily. [name]  Zoster Vaccine Live, PF, (ZOSTAVAX) 95284 UNT/0.65ML injection, Zostavax (PF) 19,400 unit/0.65 mL subcutaneous suspension  Current Facility-Administered Medications (Other):  .  0.9 %  sodium chloride infusion    Past medical history, social, surgical and family history all reviewed in electronic medical record.  No pertanent information unless stated regarding to the chief complaint.   Review of Systems:  No headache, visual changes, nausea, vomiting, diarrhea, constipation, dizziness, abdominal pain, skin rash, fevers, chills, night sweats, weight loss, swollen lymph nodes, body aches, , chest pain, shortness of breath, mood changes.  Positive muscle aches and joint swelling  Objective  Blood pressure (!) 102/58, pulse 66, height 5' 4"  (1.626 m), weight 206 lb (93.4 kg), SpO2 93 %.    General: No apparent distress alert and oriented x3 mood and affect normal,  dressed appropriately.  HEENT: Pupils equal, extraocular movements intact  Respiratory: Patient's speak in full sentences and does not appear short of breath  Cardiovascular: No lower extremity edema, non tender, no erythema  Skin: Warm dry intact with no signs of infection or rash on extremities or on axial skeleton.  Abdomen: Soft nontender  Neuro: Cranial nerves II through XII are intact, neurovascularly intact in all extremities with 2+ DTRs and 2+ pulses.  Lymph: No lymphadenopathy of posterior or anterior cervical chain or axillae bilaterally.  Gait antalgic MSK:  tender with limited range of motion and good stability and symmetric strength and tone of shoulders, elbows, wrist, hip, and ankles bilaterally.  Knee: Bilateral valgus deformity noted. Large thigh to calf ratio.  Tender to palpation over medial and PF joint line.  ROM full in flexion and extension and lower leg rotation. instability with valgus force.  painful patellar compression. Patellar glide with moderate crepitus. Patellar and quadriceps tendons unremarkable. Hamstring and quadriceps strength is normal.  Back exam shows some loss of lordosis.  Severe tenderness to palpation mostly over the right sacral area.  Unable to do [place] secondary to pain.  Tightness of the lower extremities with straight leg test bilaterally  After informed written and verbal consent, patient was seated on exam table. Right knee was prepped with alcohol swab and utilizing anterolateral approach, patient's right knee space was injected with 4:1  marcaine 0.5%: Kenalog 71m/dL. Patient tolerated the procedure well without immediate complications.  After informed written and verbal consent, patient was seated on exam table. Left knee was prepped with alcohol swab and utilizing anterolateral approach, patient's left knee space was injected with 4:1  marcaine 0.5%: Kenalog 420mdL. Patient tolerated the procedure well without immediate  complications.   Impression and Recommendations:     This case required medical decision making of moderate complexity. The above documentation has been reviewed and is accurate and  complete Lyndal Pulley, DO       Note: This dictation was prepared with Dragon dictation along with smaller phrase technology. Any transcriptional errors that result from this process are unintentional.

## 2017-11-04 NOTE — Patient Instructions (Addendum)
Good to see you  Heather [name] is your friend.  Stay active Injected both knees Write me in 2 weeks if not better consider epidural in the back  See me again in 4 weeks in case we need monovisc Read about PRP

## 2017-11-04 NOTE — Assessment & Plan Note (Signed)
Bilateral injections given.  Discussed icing regimen and home exercises.  Discussed which activities of doing which wants to avoid.  Discussed posture and ergonomics.  Discussed which activities to do.  Patient could be a candidate again for [place] supplementation if needed but hopefully patient will continue to do well.  Follow-up again in 4 to 6 weeks

## 2017-11-04 NOTE — Assessment & Plan Note (Signed)
Known degenerative disc disease.  Been 2 years.  Follow-up again 4 weeks after conservative therapy.  May need advanced imaging and epidurals

## 2017-11-09 ENCOUNTER — Ambulatory Visit (INDEPENDENT_AMBULATORY_CARE_PROVIDER_SITE_OTHER): Payer: Medicare Other | Admitting: Family Medicine

## 2017-11-09 VITALS — BP 95/61 | HR 84 | Temp 97.9°F | Ht 64.0 in | Wt 201.0 lb

## 2017-11-09 DIAGNOSIS — E669 Obesity, unspecified: Secondary | ICD-10-CM

## 2017-11-09 DIAGNOSIS — E7849 Other hyperlipidemia: Secondary | ICD-10-CM | POA: Diagnosis not present

## 2017-11-09 DIAGNOSIS — Z6833 Body mass index (BMI) 33.0-33.9, adult: Secondary | ICD-10-CM

## 2017-11-11 NOTE — Progress Notes (Signed)
Office: [phone]  /  Fax: [phone]   HPI:   Chief Complaint: OBESITY Heather [name] is here to discuss her progress with her obesity treatment plan. She is on the lower carbohydrate, vegetable and lean protein rich diet plan and is following her eating plan approximately 90 % of the time. She states she is exercising 0 minutes 0 times per week. Heather [name] continues to do well with weight loss on her low carbohydrate plan. She states she feels better with decreased pain but feels her muscle tone is worsening. She is tired of the low carbohydrate plan and would like to change back to Category 2 plan.  Her weight is 201 lb (91.2 kg) today and has had a weight loss of 6 pounds over a period of 3 weeks since her last visit. She has lost 67 lbs since starting treatment with [name].  Hyperlipidemia Heather [name] has hyperlipidemia and has been working on improving her cholesterol levels with intensive lifestyle modification including a low saturated fat diet, exercise and weight loss. She is Crestor and denies any chest pain, claudication or myalgias.  ALLERGIES: Allergies  Allergen Reactions  . Aspirin   . Coconut Oil   . Latex   . Lisinopril     REACTION: cough  . Metoprolol Itching  . Peanut-Containing Drug Products   . Penicillins     REACTION: rash  . Prednisone   . Strawberry Extract     MEDICATIONS: Current Outpatient Medications on File Prior to Visit  Medication Sig Dispense Refill  . acetaminophen (TYLENOL) 500 MG tablet Take 650 mg by mouth 3 (three) times daily as needed for pain.     [name] ALLEGRA-D ALLERGY & CONGESTION 180-240 MG 24 hr tablet TAKE 1 TABLET BY MOUTH DAILY 30 tablet 0  . Calcium Carbonate-Vitamin D (CALCIUM 600 + D PO) Take 2 tablets by mouth daily.      . clopidogrel (PLAVIX) 75 MG tablet Take 1 tablet (75 mg total) by mouth daily. 90 tablet 3  . doxycycline (VIBRA-TABS) 100 MG tablet Take 1 tablet (100 mg total) by mouth 2 (two) times daily. 20 tablet 0  . Ferrous Sulfate  (IRON) 325 (65 FE) MG TABS Take 1 tablet by mouth daily.      . Fluticasone-Salmeterol (ADVAIR DISKUS) 100-50 MCG/DOSE AEPB Advair Diskus 100 mcg-50 mcg/dose powder for inhalation    . furosemide (LASIX) 40 MG tablet TAKE 1 TABLET EVERY DAY 90 tablet 3  . gabapentin (NEURONTIN) 100 MG capsule TAKE 2 CAPSULES(200 MG) BY MOUTH AT BEDTIME 180 capsule 1  . glucosamine-chondroitin 500-400 MG tablet Take 1 tablet by mouth 2 (two) times daily.      [name] KLOR-CON M20 20 MEQ tablet TAKE 1 TABLET EVERY DAY 90 tablet 3  . losartan (COZAAR) 25 MG tablet TAKE 1 TABLET EVERY DAY    . metFORMIN (GLUCOPHAGE-XR) 500 MG 24 hr tablet TAKE 2 TABLETS TWICE DAILY 360 tablet 3  . modafinil (PROVIGIL) 200 MG tablet modafinil 200 mg tablet    . Multiple Vitamin (MULTIVITAMIN) tablet Take 1 tablet by mouth daily.      . naproxen sodium (ANAPROX) 220 MG tablet Take 220 mg by mouth as needed.    . Omega-3 Fatty Acids (FISH OIL) 1000 MG CAPS Take 1 capsule by mouth daily.    [name] omeprazole (PRILOSEC) 20 MG capsule Take 20 mg by mouth daily as needed.     . Probiotic Product (PROBIOTIC-10) CAPS Take 1 capsule by mouth daily.    . pseudoephedrine-acetaminophen (  TYLENOL SINUS) 30-500 MG TABS tablet Take 1 tablet by mouth as needed.    . pyridOXINE (VITAMIN B-6) 100 MG tablet Take 200 mg by mouth daily.    . rosuvastatin (CRESTOR) 40 MG tablet TAKE 1 TABLET EVERY DAY 90 tablet 3  . SF 5000 PLUS 1.1 % CREA dental cream   0  . Sod Fluoride-Potassium Nitrate (PREVIDENT 5000 SENSITIVE) 1.1-5 % PSTE PreviDent 5000 Sensitive 1.1 %-5 % dental paste    . SUMAtriptan (IMITREX) 20 MG/ACT nasal spray sumatriptan 20 mg/actuation nasal spray    . topiramate (TOPAMAX) 100 MG tablet TAKE 1 TABLET EVERY DAY 90 tablet 3  . traMADol (ULTRAM) 50 MG tablet tramadol 50 mg tablet    . triamcinolone cream (KENALOG) 0.1 % triamcinolone acetonide 0.1 % topical cream    . Turmeric 500 MG TABS Take 2 tablets by mouth daily.    [name] venlafaxine XR (EFFEXOR-XR)  37.5 MG 24 hr capsule Take 1 capsule (37.5 mg total) by mouth daily with breakfast. 90 capsule 1  . vitamin E 200 UNIT capsule Take 200 Units by mouth daily.    [name] Zoster Vaccine Live, PF, (ZOSTAVAX) 02585 UNT/0.65ML injection Zostavax (PF) 19,400 unit/0.65 mL subcutaneous suspension     Current Facility-Administered Medications on File Prior to Visit  Medication Dose Route Frequency Provider Last Rate Last Dose  . 0.9 %  sodium chloride infusion  500 mL Intravenous Continuous Nandigam, Venia Minks, MD        PAST MEDICAL HISTORY: Past Medical History:  Diagnosis Date  . Abnormal Pap smear   . ALLERGIC RHINITIS 10/22/2006  . ANEMIA 12/18/2008  . Arthritis   . ASYMPTOMATIC POSTMENOPAUSAL STATUS 11/22/2007  . Back pain   . Complication of anesthesia   . Degenerative arthritis   . DIABETES MELLITUS, TYPE II 01/13/2007  . Dyslipidemia   . Fibroid   . GERD 07/20/2007  . GOITER, MULTINODULAR 07/20/2007  . Headache(784.0) 07/20/2007  . HEARING LOSS 11/22/2007  . Hiatal hernia   . Hx of colposcopy with cervical biopsy   . HYPERCHOLESTEROLEMIA 01/13/2007  . Hyperglycemia   . HYPERTENSION 10/22/2006  . NASH (nonalcoholic steatohepatitis)   . Nocturnal hypoxemia 02/17/2013  . Obesity   . Obstructive sleep apnea   . OSTEOARTHRITIS 10/22/2006  . Other chronic nonalcoholic liver disease 2/77/8242  . Sleep apnea     PAST SURGICAL HISTORY: Past Surgical History:  Procedure Laterality Date  . BREAST SURGERY     Breast reduction  . CATARACT EXTRACTION, BILATERAL    . DEXA  08/2005  . DILATION AND CURETTAGE OF UTERUS    . ELECTROCARDIOGRAM  10/15/2006  . ESOPHAGOGASTRODUODENOSCOPY  12/08/2005  . Stress Cardiolite  10/21/2005  . sweat gland removal    . WISDOM TOOTH EXTRACTION      SOCIAL HISTORY: Social History   Tobacco Use  . Smoking status: Former Smoker    Packs/day: 2.00    Years: 20.00    Pack years: 40.00    Types: Cigarettes    Last attempt to quit: 03/03/1979    Years since  quitting: 38.7  . Smokeless tobacco: Never Used  Substance Use Topics  . Alcohol use: Yes    Comment: rare  . Drug use: No    FAMILY HISTORY: Family History  Problem Relation Age of Onset  . Asthma Mother   . Depression Mother   . Bipolar disorder Mother   . Dementia Mother   . Arthritis Mother   . Breast cancer Mother  primary  . Colon cancer Mother        mets from breast  . Cancer Mother   . Hypertension Father   . Migraines Father   . Cancer Maternal Aunt   . Heart disease Maternal Grandmother   . Cancer Maternal Grandfather     ROS: Review of Systems  Constitutional: Positive for weight loss.  Cardiovascular: Negative for chest pain and claudication.  Musculoskeletal: Negative for myalgias.    PHYSICAL EXAM: Blood pressure 95/61, pulse 84, temperature 97.9 F (36.6 C), temperature source Oral, height 5' 4"  (1.626 m), weight 201 lb (91.2 kg), SpO2 98 %. Body mass index is 34.5 kg/m. Physical Exam  Constitutional: She is oriented to person, place, and time. She appears well-developed and well-nourished.  Cardiovascular: Normal rate.  Pulmonary/Chest: Effort normal.  Musculoskeletal: Normal range of motion.  Neurological: She is oriented to person, place, and time.  Skin: Skin is warm and dry.  Psychiatric: She has a normal mood and affect. Her behavior is normal.  Vitals reviewed.   RECENT LABS AND TESTS: BMET    Component Value Date/Time   NA 140 07/29/2017 1040   K 3.8 07/29/2017 1040   CL 102 07/29/2017 1040   CO2 24 07/29/2017 1040   GLUCOSE 90 07/29/2017 1040   GLUCOSE 116 (H) 03/05/2016 0958   BUN 17 07/29/2017 1040   CREATININE 0.78 07/29/2017 1040   CREATININE 0.81 12/28/2011 0853   CALCIUM 9.5 07/29/2017 1040   GFRNONAA 77 07/29/2017 1040   GFRAA 89 07/29/2017 1040   Lab Results  Component Value Date   HGBA1C 5.7 (H) 07/29/2017   HGBA1C 5.9 03/15/2017   HGBA1C 6.3 (H) 12/23/2016   HGBA1C 6.3 09/08/2016   HGBA1C 6.4 03/05/2016    Lab Results  Component Value Date   INSULIN 6.8 07/29/2017   INSULIN 20.9 12/23/2016   CBC    Component Value Date/Time   WBC 4.9 12/23/2016 0956   WBC 7.2 03/05/2016 0958   RBC 4.32 12/23/2016 0956   RBC 4.46 03/05/2016 0958   HGB 12.1 12/23/2016 0956   HCT 37.4 12/23/2016 0956   PLT 300.0 03/05/2016 0958   MCV 87 12/23/2016 0956   MCH 28.0 12/23/2016 0956   MCH 29.4 12/28/2011 0853   MCHC 32.4 12/23/2016 0956   MCHC 32.9 03/05/2016 0958   RDW 17.5 (H) 12/23/2016 0956   LYMPHSABS 1.2 12/23/2016 0956   MONOABS 0.4 03/05/2016 0958   EOSABS 0.1 12/23/2016 0956   BASOSABS 0.0 12/23/2016 0956   Iron/TIBC/Ferritin/ %Sat    Component Value Date/Time   IRON 38 12/23/2016 0956   TIBC 271 12/23/2016 0956   FERRITIN 40 12/23/2016 0956   IRONPCTSAT 14 (L) 12/23/2016 0956   Lipid Panel     Component Value Date/Time   CHOL 195 07/29/2017 1040   TRIG 68 07/29/2017 1040   HDL 72 07/29/2017 1040   CHOLHDL 3 03/05/2016 0958   VLDL 26.0 03/05/2016 0958   LDLCALC 109 (H) 07/29/2017 1040   Hepatic Function Panel     Component Value Date/Time   PROT 7.5 07/29/2017 1040   ALBUMIN 4.3 07/29/2017 1040   AST 15 07/29/2017 1040   ALT 10 07/29/2017 1040   ALKPHOS 71 07/29/2017 1040   BILITOT <0.2 07/29/2017 1040   BILIDIR 0.1 02/15/2015 1010   IBILI 0.2 12/28/2011 0853      Component Value Date/Time   TSH 1.170 12/23/2016 0956   TSH 1.76 03/05/2016 0958   TSH 1.86 02/15/2015 1010  ASSESSMENT AND PLAN: Other hyperlipidemia  Class 1 obesity with serious comorbidity and body mass index (BMI) of 33.0 to 33.9 in adult, unspecified obesity type  PLAN:  Hyperlipidemia Heather [name] was informed of the American Heart Association Guidelines emphasizing intensive lifestyle modifications as the first line treatment for hyperlipidemia. We discussed many lifestyle modifications today in depth, and Heather [name] will continue to work on decreasing saturated fats such as fatty red meat, butter and  many fried foods. She will also increase vegetables and lean protein in her diet and continue to work on diet, exercise, and weight loss efforts. Heather [name] agrees to follow up with our clinic in 3 weeks and we will recheck labs at that time.  I spent > than 50% of the 15 minute visit on counseling as documented in the note.  Obesity Heather [name] is currently in the action stage of change. As such, her goal is to continue with weight loss efforts She has agreed to follow the Category 2 plan Heather [name] has been instructed to work up to a goal of 150 minutes of combined cardio and strengthening exercise per week for weight loss and overall health benefits. We discussed the following Behavioral Modification Strategies today: increasing lean protein intake, decreasing simple carbohydrates, decrease eating out, and travel eating strategies    Heather [name] has agreed to follow up with our clinic in 3 weeks. She was informed of the importance of frequent follow up visits to maximize her success with intensive lifestyle modifications for her multiple health conditions.   OBESITY BEHAVIORAL INTERVENTION VISIT  Today's visit was # 18   Starting weight: 268 lbs Starting date: 12/23/16 Today's weight : 201 lbs Today's date: 11/09/2017 Total lbs lost to date: 75    ASK: We discussed the diagnosis of obesity with Heather [name] today and Heather [name] agreed to give [name] permission to discuss obesity behavioral modification therapy today.  ASSESS: Heather [name] has the diagnosis of obesity and her BMI today is 34.48 Heather [name] is in the action stage of change   ADVISE: Heather [name] was educated on the multiple health risks of obesity as well as the benefit of weight loss to improve her health. She was advised of the need for long term treatment and the importance of lifestyle modifications to improve her current health and to decrease her risk of future health problems.  AGREE: Multiple dietary modification options and treatment options were  discussed and  Heather [name] agreed to follow the recommendations documented in the above note.  ARRANGE: Heather [name] was educated on the importance of frequent visits to treat obesity as outlined per CMS and USPSTF guidelines and agreed to schedule her next follow up appointment today.  I, [name], am acting as transcriptionist for [name], MD  I have reviewed the above documentation for accuracy and completeness, and I agree with the above. -[name], MD

## 2017-11-21 ENCOUNTER — Other Ambulatory Visit: Payer: Self-pay | Admitting: Endocrinology

## 2017-11-23 ENCOUNTER — Ambulatory Visit: Payer: Medicare Other | Admitting: Podiatry

## 2017-11-23 ENCOUNTER — Ambulatory Visit (INDEPENDENT_AMBULATORY_CARE_PROVIDER_SITE_OTHER): Payer: Medicare Other | Admitting: Podiatry

## 2017-11-23 ENCOUNTER — Encounter: Payer: Self-pay | Admitting: Podiatry

## 2017-11-23 DIAGNOSIS — Z9889 Other specified postprocedural states: Secondary | ICD-10-CM

## 2017-11-23 DIAGNOSIS — T847XXD Infection and inflammatory reaction due to other internal orthopedic prosthetic devices, implants and grafts, subsequent encounter: Secondary | ICD-10-CM

## 2017-11-24 NOTE — Progress Notes (Signed)
She presents today for a postop visit states that she seems to be doing a lot better.  Objective: Date of surgery 8 16 2019.  Removal fixation deep second toe.  No erythema edema saline strange odor appears to be healing very well.  Assessment: Well-healing surgical toe.  Plan: Follow-up with me on as-needed basis.

## 2017-12-01 ENCOUNTER — Ambulatory Visit (INDEPENDENT_AMBULATORY_CARE_PROVIDER_SITE_OTHER): Payer: Medicare Other | Admitting: Family Medicine

## 2017-12-01 VITALS — BP 108/70 | HR 74 | Temp 97.8°F | Ht 64.0 in | Wt 204.0 lb

## 2017-12-01 DIAGNOSIS — D508 Other iron deficiency anemias: Secondary | ICD-10-CM | POA: Diagnosis not present

## 2017-12-01 DIAGNOSIS — E7849 Other hyperlipidemia: Secondary | ICD-10-CM | POA: Diagnosis not present

## 2017-12-01 DIAGNOSIS — Z6834 Body mass index (BMI) 34.0-34.9, adult: Secondary | ICD-10-CM | POA: Diagnosis not present

## 2017-12-01 DIAGNOSIS — E669 Obesity, unspecified: Secondary | ICD-10-CM | POA: Diagnosis not present

## 2017-12-01 DIAGNOSIS — E559 Vitamin D deficiency, unspecified: Secondary | ICD-10-CM

## 2017-12-01 DIAGNOSIS — R7303 Prediabetes: Secondary | ICD-10-CM | POA: Diagnosis not present

## 2017-12-01 NOTE — Progress Notes (Signed)
Office: [phone]  /  Fax: [phone]   HPI:   Chief Complaint: OBESITY Heather [name] is here to discuss her progress with her obesity treatment plan. She is on the Category 2 plan and is following her eating plan approximately 50 % of the time. She states she is exercising on the elliptical 15 minutes 3 times per week. Heather [name] has struggled to stay on track since our last visit. She has increased traveling. Heather [name] is eating out and she indulged more while she was not being mindful. Her mood had decreased and she also noted and increase in emotional and comfort eating. Her weight is 204 lb (92.5 kg) today and has not lost weight since her last visit. She has lost 64 lbs since starting treatment with [name].  Pre-Diabetes Heather [name] has a diagnosis of prediabetes based on her elevated Hgb A1c and was informed this puts her at greater risk of developing diabetes. Heather [name] is taking metformin currently and she continues to work on diet and exercise to decrease risk of diabetes. She denies nausea, vomiting or hypoglycemia. Heather [name] is due for labs.  Vitamin D deficiency Heather [name] has a diagnosis of vitamin D deficiency and she is due for labs. She is currently taking OTC calcium-vit D and multi vitamin. She denies nausea, vomiting or muscle weakness.  Hyperlipidemia Heather [name] has hyperlipidemia and she is on Crestor. Heather [name] is working on diet and weight loss. She has been trying to improve her cholesterol levels with intensive lifestyle modification including a low saturated fat diet, exercise and weight loss. She denies any chest pain or myalgias. Heather [name] is due for labs.  Anemia Heather [name] has a history of iron deficiency anemia and has been controlled, but there are no recent labs.  Her fatigue is improving.  ALLERGIES: Allergies  Allergen Reactions  . Aspirin   . Coconut Oil   . Latex   . Lisinopril     REACTION: cough  . Metoprolol Itching  . Peanut-Containing Drug Products   . Penicillins     REACTION: rash  .  Prednisone   . Strawberry Extract     MEDICATIONS: Current Outpatient Medications on File Prior to Visit  Medication Sig Dispense Refill  . acetaminophen (TYLENOL) 500 MG tablet Take 650 mg by mouth 3 (three) times daily as needed for pain.     [name] ALLEGRA-D ALLERGY & CONGESTION 180-240 MG 24 hr tablet TAKE 1 TABLET BY MOUTH DAILY 30 tablet 0  . Calcium Carbonate-Vitamin D (CALCIUM 600 + D PO) Take 2 tablets by mouth daily.      . clopidogrel (PLAVIX) 75 MG tablet Take 1 tablet (75 mg total) by mouth daily. 90 tablet 3  . Ferrous Sulfate (IRON) 325 (65 FE) MG TABS Take 1 tablet by mouth daily.      . Fluticasone-Salmeterol (ADVAIR DISKUS) 100-50 MCG/DOSE AEPB Advair Diskus 100 mcg-50 mcg/dose powder for inhalation    . furosemide (LASIX) 40 MG tablet TAKE 1 TABLET EVERY DAY 90 tablet 3  . gabapentin (NEURONTIN) 100 MG capsule TAKE 2 CAPSULES(200 MG) BY MOUTH AT BEDTIME 180 capsule 1  . glucosamine-chondroitin 500-400 MG tablet Take 1 tablet by mouth 2 (two) times daily.      [name] KLOR-CON M20 20 MEQ tablet TAKE 1 TABLET EVERY DAY 90 tablet 3  . losartan (COZAAR) 25 MG tablet TAKE 1 TABLET EVERY DAY    . metFORMIN (GLUCOPHAGE-XR) 500 MG 24 hr tablet TAKE 2 TABLETS TWICE DAILY 360 tablet 3  . modafinil (PROVIGIL) 200 MG tablet  modafinil 200 mg tablet    . Multiple Vitamin (MULTIVITAMIN) tablet Take 1 tablet by mouth daily.      . naproxen sodium (ANAPROX) 220 MG tablet Take 220 mg by mouth as needed.    . Omega-3 Fatty Acids (FISH OIL) 1000 MG CAPS Take 1 capsule by mouth daily.    [name] omeprazole (PRILOSEC) 20 MG capsule Take 20 mg by mouth daily as needed.     . Probiotic Product (PROBIOTIC-10) CAPS Take 1 capsule by mouth daily.    . pseudoephedrine-acetaminophen (TYLENOL SINUS) 30-500 MG TABS tablet Take 1 tablet by mouth as needed.    . pyridOXINE (VITAMIN B-6) 100 MG tablet Take 200 mg by mouth daily.    . rosuvastatin (CRESTOR) 40 MG tablet TAKE 1 TABLET EVERY DAY 90 tablet 3  . SF 5000 PLUS  1.1 % CREA dental cream   0  . Sod Fluoride-Potassium Nitrate (PREVIDENT 5000 SENSITIVE) 1.1-5 % PSTE PreviDent 5000 Sensitive 1.1 %-5 % dental paste    . SUMAtriptan (IMITREX) 20 MG/ACT nasal spray sumatriptan 20 mg/actuation nasal spray    . topiramate (TOPAMAX) 100 MG tablet TAKE 1 TABLET EVERY DAY 90 tablet 3  . traMADol (ULTRAM) 50 MG tablet tramadol 50 mg tablet    . triamcinolone cream (KENALOG) 0.1 % triamcinolone acetonide 0.1 % topical cream    . Turmeric 500 MG TABS Take 2 tablets by mouth daily.    [name] venlafaxine XR (EFFEXOR-XR) 37.5 MG 24 hr capsule Take 1 capsule (37.5 mg total) by mouth daily with breakfast. 90 capsule 1  . vitamin E 200 UNIT capsule Take 200 Units by mouth daily.    [name] Zoster Vaccine Live, PF, (ZOSTAVAX) 38937 UNT/0.65ML injection Zostavax (PF) 19,400 unit/0.65 mL subcutaneous suspension     Current Facility-Administered Medications on File Prior to Visit  Medication Dose Route Frequency Provider Last Rate Last Dose  . 0.9 %  sodium chloride infusion  500 mL Intravenous Continuous Nandigam, Venia Minks, MD        PAST MEDICAL HISTORY: Past Medical History:  Diagnosis Date  . Abnormal Pap smear   . ALLERGIC RHINITIS 10/22/2006  . ANEMIA 12/18/2008  . Arthritis   . ASYMPTOMATIC POSTMENOPAUSAL STATUS 11/22/2007  . Back pain   . Complication of anesthesia   . Degenerative arthritis   . DIABETES MELLITUS, TYPE II 01/13/2007  . Dyslipidemia   . Fibroid   . GERD 07/20/2007  . GOITER, MULTINODULAR 07/20/2007  . Headache(784.0) 07/20/2007  . HEARING LOSS 11/22/2007  . Hiatal hernia   . Hx of colposcopy with cervical biopsy   . HYPERCHOLESTEROLEMIA 01/13/2007  . Hyperglycemia   . HYPERTENSION 10/22/2006  . NASH (nonalcoholic steatohepatitis)   . Nocturnal hypoxemia 02/17/2013  . Obesity   . Obstructive sleep apnea   . OSTEOARTHRITIS 10/22/2006  . Other chronic nonalcoholic liver disease 3/42/8768  . Sleep apnea     PAST SURGICAL HISTORY: Past Surgical  History:  Procedure Laterality Date  . BREAST SURGERY     Breast reduction  . CATARACT EXTRACTION, BILATERAL    . DEXA  08/2005  . DILATION AND CURETTAGE OF UTERUS    . ELECTROCARDIOGRAM  10/15/2006  . ESOPHAGOGASTRODUODENOSCOPY  12/08/2005  . Stress Cardiolite  10/21/2005  . sweat gland removal    . WISDOM TOOTH EXTRACTION      SOCIAL HISTORY: Social History   Tobacco Use  . Smoking status: Former Smoker    Packs/day: 2.00    Years: 20.00    Pack  years: 40.00    Types: Cigarettes    Last attempt to quit: 03/03/1979    Years since quitting: 38.7  . Smokeless tobacco: Never Used  Substance Use Topics  . Alcohol use: Yes    Comment: rare  . Drug use: No    FAMILY HISTORY: Family History  Problem Relation Age of Onset  . Asthma Mother   . Depression Mother   . Bipolar disorder Mother   . Dementia Mother   . Arthritis Mother   . Breast cancer Mother        primary  . Colon cancer Mother        mets from breast  . Cancer Mother   . Hypertension Father   . Migraines Father   . Cancer Maternal Aunt   . Heart disease Maternal Grandmother   . Cancer Maternal Grandfather     ROS: Review of Systems  Constitutional: Positive for malaise/fatigue. Negative for weight loss.  Cardiovascular: Negative for chest pain.  Gastrointestinal: Negative for nausea and vomiting.  Musculoskeletal: Negative for myalgias.       Negative for muscle weakness  Endo/Heme/Allergies:       Negative for hypoglycemia    PHYSICAL EXAM: Blood pressure 108/70, pulse 74, temperature 97.8 F (36.6 C), temperature source Oral, height 5' 4"  (1.626 m), weight 204 lb (92.5 kg), SpO2 100 %. Body mass index is 35.02 kg/m. Physical Exam  Constitutional: She is oriented to person, place, and time. She appears well-developed and well-nourished.  Cardiovascular: Normal rate.  Pulmonary/Chest: Effort normal.  Musculoskeletal: Normal range of motion.  Neurological: She is oriented to person, place, and  time.  Skin: Skin is warm and dry.  Psychiatric: She has a normal mood and affect. Her behavior is normal.  Vitals reviewed.   RECENT LABS AND TESTS: BMET    Component Value Date/Time   NA 140 07/29/2017 1040   K 3.8 07/29/2017 1040   CL 102 07/29/2017 1040   CO2 24 07/29/2017 1040   GLUCOSE 90 07/29/2017 1040   GLUCOSE 116 (H) 03/05/2016 0958   BUN 17 07/29/2017 1040   CREATININE 0.78 07/29/2017 1040   CREATININE 0.81 12/28/2011 0853   CALCIUM 9.5 07/29/2017 1040   GFRNONAA 77 07/29/2017 1040   GFRAA 89 07/29/2017 1040   Lab Results  Component Value Date   HGBA1C 5.7 (H) 07/29/2017   HGBA1C 5.9 03/15/2017   HGBA1C 6.3 (H) 12/23/2016   HGBA1C 6.3 09/08/2016   HGBA1C 6.4 03/05/2016   Lab Results  Component Value Date   INSULIN 6.8 07/29/2017   INSULIN 20.9 12/23/2016   CBC    Component Value Date/Time   WBC 4.9 12/23/2016 0956   WBC 7.2 03/05/2016 0958   RBC 4.32 12/23/2016 0956   RBC 4.46 03/05/2016 0958   HGB 12.1 12/23/2016 0956   HCT 37.4 12/23/2016 0956   PLT 300.0 03/05/2016 0958   MCV 87 12/23/2016 0956   MCH 28.0 12/23/2016 0956   MCH 29.4 12/28/2011 0853   MCHC 32.4 12/23/2016 0956   MCHC 32.9 03/05/2016 0958   RDW 17.5 (H) 12/23/2016 0956   LYMPHSABS 1.2 12/23/2016 0956   MONOABS 0.4 03/05/2016 0958   EOSABS 0.1 12/23/2016 0956   BASOSABS 0.0 12/23/2016 0956   Iron/TIBC/Ferritin/ %Sat    Component Value Date/Time   IRON 38 12/23/2016 0956   TIBC 271 12/23/2016 0956   FERRITIN 40 12/23/2016 0956   IRONPCTSAT 14 (L) 12/23/2016 0956   Lipid Panel     Component  Value Date/Time   CHOL 195 07/29/2017 1040   TRIG 68 07/29/2017 1040   HDL 72 07/29/2017 1040   CHOLHDL 3 03/05/2016 0958   VLDL 26.0 03/05/2016 0958   LDLCALC 109 (H) 07/29/2017 1040   Hepatic Function Panel     Component Value Date/Time   PROT 7.5 07/29/2017 1040   ALBUMIN 4.3 07/29/2017 1040   AST 15 07/29/2017 1040   ALT 10 07/29/2017 1040   ALKPHOS 71 07/29/2017 1040     BILITOT <0.2 07/29/2017 1040   BILIDIR 0.1 02/15/2015 1010   IBILI 0.2 12/28/2011 0853      Component Value Date/Time   TSH 1.170 12/23/2016 0956   TSH 1.76 03/05/2016 0958   TSH 1.86 02/15/2015 1010   Results for CLARIS, PECH (MRN [id number]) as of 12/01/2017 17:19  Ref. Range 07/29/2017 10:40  Vitamin D, 25-Hydroxy Latest Ref Range: 30.0 - 100.0 ng/mL 40.7   ASSESSMENT AND PLAN: Prediabetes - Plan: Comprehensive metabolic panel, Hemoglobin A1c, Insulin, random  Vitamin D deficiency - Plan: VITAMIN D 25 Hydroxy (Vit-D Deficiency, Fractures)  Other hyperlipidemia - Plan: Lipid Panel With LDL/HDL Ratio  Other iron deficiency anemia - Plan: CBC With Differential  Class 1 obesity with serious comorbidity and body mass index (BMI) of 34.0 to 34.9 in adult, unspecified obesity type  PLAN:  Pre-Diabetes Heather [name] will continue to work on weight loss, exercise, and decreasing simple carbohydrates in her diet to help decrease the risk of diabetes. We dicussed metformin including benefits and risks. She was informed that eating too many simple carbohydrates or too many calories at one sitting increases the likelihood of GI side effects. Heather [name] will continue metformin and we will check labs. Heather [name] agreed to follow up with [name] as directed to monitor her progress.  Vitamin D Deficiency Heather [name] was informed that low vitamin D levels contributes to fatigue and are associated with obesity, breast, and colon cancer. She agrees to continue to take OTC calcium-vitamin D and multivitamin. She will follow up for routine testing of vitamin D, at least 2-3 times per year. She was informed of the risk of over-replacement of vitamin D and agrees to not increase her dose unless she discusses this with [name] first. We will check labs and follow.  Hyperlipidemia Heather [name] was informed of the American Heart Association Guidelines emphasizing intensive lifestyle modifications as the first line treatment for  hyperlipidemia. We discussed many lifestyle modifications today in depth, and Heather [name] will continue to work on decreasing saturated fats such as fatty red meat, butter and many fried foods. She will also increase vegetables and lean protein in her diet and continue to work on exercise and weight loss efforts. We will check labs and Heather [name] will continue Crestor and follow up as directed.  Anemia The diagnosis of Iron deficiency anemia was discussed with Heather [name] and was explained in detail. She will continue with her higher iron diet. We will check labs and Heather [name] will follow up at the agreed upon time.   Obesity Heather [name] is currently in the action stage of change. As such, her goal is to continue with weight loss efforts She has agreed to follow the Category 2 plan Heather [name] has been instructed to work up to a goal of 150 minutes of combined cardio and strengthening exercise per week for weight loss and overall health benefits. We discussed the following Behavioral Modification Strategies today: travel eating strategies, increasing lean protein intake, work on meal planning and easy cooking plans and ways to avoid boredom  eating  Heather [name] has agreed to follow up with our clinic in 3 to 4 weeks. She was informed of the importance of frequent follow up visits to maximize her success with intensive lifestyle modifications for her multiple health conditions.   OBESITY BEHAVIORAL INTERVENTION VISIT  Today's visit was # 19   Starting weight: 268 lbs Starting date: 12/23/16 Today's weight : 204 lbs  Today's date: 12/01/2017 Total lbs lost to date: 72 At least 15 minutes were spent on discussing the following behavioral intervention visit.   ASK: We discussed the diagnosis of obesity with Heather [name] Heather [name] today and Heather [name] agreed to give [name] permission to discuss obesity behavioral modification therapy today.  ASSESS: Heather [name] has the diagnosis of obesity and her BMI today is 37 Heather [name] is in the action stage of  change   ADVISE: Heather [name] was educated on the multiple health risks of obesity as well as the benefit of weight loss to improve her health. She was advised of the need for long term treatment and the importance of lifestyle modifications to improve her current health and to decrease her risk of future health problems.  AGREE: Multiple dietary modification options and treatment options were discussed and  Heather [name] agreed to follow the recommendations documented in the above note.  ARRANGE: Adiyah was educated on the importance of frequent visits to treat obesity as outlined per CMS and USPSTF guidelines and agreed to schedule her next follow up appointment today.  I, Doreene Nest, am acting as transcriptionist for [name], MD  I have reviewed the above documentation for accuracy and completeness, and I agree with the above. -[name], MD

## 2017-12-02 ENCOUNTER — Encounter: Payer: Self-pay | Admitting: Family Medicine

## 2017-12-02 ENCOUNTER — Ambulatory Visit (INDEPENDENT_AMBULATORY_CARE_PROVIDER_SITE_OTHER): Payer: Medicare Other | Admitting: Family Medicine

## 2017-12-02 VITALS — BP 110/68 | HR 89 | Ht 64.0 in | Wt 208.0 lb

## 2017-12-02 DIAGNOSIS — I2510 Atherosclerotic heart disease of native coronary artery without angina pectoris: Secondary | ICD-10-CM

## 2017-12-02 DIAGNOSIS — D510 Vitamin B12 deficiency anemia due to intrinsic factor deficiency: Secondary | ICD-10-CM

## 2017-12-02 DIAGNOSIS — Z6841 Body Mass Index (BMI) 40.0 and over, adult: Secondary | ICD-10-CM

## 2017-12-02 DIAGNOSIS — M170 Bilateral primary osteoarthritis of knee: Secondary | ICD-10-CM | POA: Diagnosis not present

## 2017-12-02 LAB — INSULIN, RANDOM: INSULIN: 5.6 u[IU]/mL (ref 2.6–24.9)

## 2017-12-02 LAB — COMPREHENSIVE METABOLIC PANEL
ALT: 20 IU/L (ref 0–32)
AST: 20 IU/L (ref 0–40)
Albumin/Globulin Ratio: 1.4 (ref 1.2–2.2)
Albumin: 4.1 g/dL (ref 3.5–4.8)
Alkaline Phosphatase: 78 IU/L (ref 39–117)
BUN / CREAT RATIO: 15 (ref 12–28)
BUN: 10 mg/dL (ref 8–27)
CO2: 27 mmol/L (ref 20–29)
Calcium: 9.2 mg/dL (ref 8.7–10.3)
Chloride: 103 mmol/L (ref 96–106)
Creatinine, Ser: 0.68 mg/dL (ref 0.57–1.00)
GFR calc Af Amer: 102 mL/min/{1.73_m2} (ref >59)
GFR calc non Af Amer: 88 mL/min/{1.73_m2} (ref >59)
GLOBULIN, TOTAL: 2.9 g/dL (ref 1.5–4.5)
Glucose: 83 mg/dL (ref 65–99)
Potassium: 3.9 mmol/L (ref 3.5–5.2)
SODIUM: 142 mmol/L (ref 134–144)
Total Protein: 7 g/dL (ref 6.0–8.5)

## 2017-12-02 LAB — CBC WITH DIFFERENTIAL
BASOS: 1 %
Basophils Absolute: 0 10*3/uL (ref 0.0–0.2)
EOS (ABSOLUTE): 0.1 10*3/uL (ref 0.0–0.4)
Eos: 3 %
Hematocrit: 39.5 % (ref 34.0–46.6)
Hemoglobin: 12.4 g/dL (ref 11.1–15.9)
Immature Grans (Abs): 0 10*3/uL (ref 0.0–0.1)
Immature Granulocytes: 0 %
LYMPHS ABS: 1.2 10*3/uL (ref 0.7–3.1)
Lymphs: 29 %
MCH: 28.5 pg (ref 26.6–33.0)
MCHC: 31.4 g/dL — AB (ref 31.5–35.7)
MCV: 91 fL (ref 79–97)
MONOS ABS: 0.4 10*3/uL (ref 0.1–0.9)
Monocytes: 10 %
NEUTROS PCT: 57 %
Neutrophils Absolute: 2.3 10*3/uL (ref 1.4–7.0)
RBC: 4.35 x10E6/uL (ref 3.77–5.28)
RDW: 16.2 % — ABNORMAL HIGH (ref 12.3–15.4)
WBC: 4 10*3/uL (ref 3.4–10.8)

## 2017-12-02 LAB — LIPID PANEL WITH LDL/HDL RATIO
Cholesterol, Total: 178 mg/dL (ref 100–199)
HDL: 76 mg/dL (ref >39)
LDL Calculated: 89 mg/dL (ref 0–99)
LDl/HDL Ratio: 1.2 ratio (ref 0.0–3.2)
Triglycerides: 66 mg/dL (ref 0–149)
VLDL CHOLESTEROL CAL: 13 mg/dL (ref 5–40)

## 2017-12-02 LAB — VITAMIN D 25 HYDROXY (VIT D DEFICIENCY, FRACTURES): VIT D 25 HYDROXY: 34.2 ng/mL (ref 30.0–100.0)

## 2017-12-02 LAB — HEMOGLOBIN A1C
Est. average glucose Bld gHb Est-mCnc: 126 mg/dL
Hgb A1c MFr Bld: 6 % — ABNORMAL HIGH (ref 4.8–5.6)

## 2017-12-02 MED ORDER — CYANOCOBALAMIN 1000 MCG/ML IJ SOLN
1000.0000 ug | Freq: Once | INTRAMUSCULAR | Status: AC
  Administered 2017-12-02: 1000 ug via INTRAMUSCULAR

## 2017-12-02 NOTE — Progress Notes (Signed)
[name] [address], [place] [address] Phone: [phone] Subjective:   [name], am serving as a scribe for Dr. Hulan Saas.  CC: Bilateral knee pain  [id number]  Heather [name] is a 72 y.o. female coming in with complaint of bilateral knee pain. Has felt improvement since last visit in both her knees and back. Her back does still bother her when she is tired.  Patient feels more improvement at this point.    Past Medical History:  Diagnosis Date  . Abnormal Pap smear   . ALLERGIC RHINITIS 10/22/2006  . ANEMIA 12/18/2008  . Arthritis   . ASYMPTOMATIC POSTMENOPAUSAL STATUS 11/22/2007  . Back pain   . Complication of anesthesia   . Degenerative arthritis   . DIABETES MELLITUS, TYPE II 01/13/2007  . Dyslipidemia   . Fibroid   . GERD 07/20/2007  . GOITER, MULTINODULAR 07/20/2007  . Headache(784.0) 07/20/2007  . HEARING LOSS 11/22/2007  . Hiatal hernia   . Hx of colposcopy with cervical biopsy   . HYPERCHOLESTEROLEMIA 01/13/2007  . Hyperglycemia   . HYPERTENSION 10/22/2006  . NASH (nonalcoholic steatohepatitis)   . Nocturnal hypoxemia 02/17/2013  . Obesity   . Obstructive sleep apnea   . OSTEOARTHRITIS 10/22/2006  . Other chronic nonalcoholic liver disease 10/27/32  . Sleep apnea    Past Surgical History:  Procedure Laterality Date  . BREAST SURGERY     Breast reduction  . CATARACT EXTRACTION, BILATERAL    . DEXA  08/2005  . DILATION AND CURETTAGE OF UTERUS    . ELECTROCARDIOGRAM  10/15/2006  . ESOPHAGOGASTRODUODENOSCOPY  12/08/2005  . Stress Cardiolite  10/21/2005  . sweat gland removal    . WISDOM TOOTH EXTRACTION     Social History   Socioeconomic History  . Marital status: Married    Spouse name: [name]  . Number of children: 2  . Years of education: College  . Highest education level: Not on file  Occupational History  . Occupation: Retired  Scientific laboratory technician  . Financial resource strain: Not on file  .  Food insecurity:    Worry: Not on file    Inability: Not on file  . Transportation needs:    Medical: Not on file    Non-medical: Not on file  Tobacco Use  . Smoking status: Former Smoker    Packs/day: 2.00    Years: 20.00    Pack years: 40.00    Types: Cigarettes    Last attempt to quit: 03/03/1979    Years since quitting: 38.7  . Smokeless tobacco: Never Used  Substance and Sexual Activity  . Alcohol use: Yes    Comment: rare  . Drug use: No  . Sexual activity: Yes    Birth control/protection: Surgical  Lifestyle  . Physical activity:    Days per week: Not on file    Minutes per session: Not on file  . Stress: Not on file  Relationships  . Social connections:    Talks on phone: Not on file    Gets together: Not on file    Attends religious service: Not on file    Active member of club or organization: Not on file    Attends meetings of clubs or organizations: Not on file    Relationship status: Not on file  Other Topics Concern  . Not on file  Social History Narrative   Patient is married [name]).   Patient has two children.   Patient  is retired.   Patient has a college education.   Patient is right-handed.   Patient lives at home with family.   Caffeine Use: 2 soda every other day   Allergies  Allergen Reactions  . Aspirin   . Coconut Oil   . Latex   . Lisinopril     REACTION: cough  . Metoprolol Itching  . Peanut-Containing Drug Products   . Penicillins     REACTION: rash  . Prednisone   . Strawberry Extract    Family History  Problem Relation Age of Onset  . Asthma Mother   . Depression Mother   . Bipolar disorder Mother   . Dementia Mother   . Arthritis Mother   . Breast cancer Mother        primary  . Colon cancer Mother        mets from breast  . Cancer Mother   . Hypertension Father   . Migraines Father   . Cancer Maternal Aunt   . Heart disease Maternal Grandmother   . Cancer Maternal Grandfather     Current Outpatient  Medications (Endocrine & Metabolic):  .  metFORMIN (GLUCOPHAGE-XR) 500 MG 24 hr tablet, TAKE 2 TABLETS TWICE DAILY   Current Outpatient Medications (Cardiovascular):  .  furosemide (LASIX) 40 MG tablet, TAKE 1 TABLET EVERY DAY .  losartan (COZAAR) 25 MG tablet, TAKE 1 TABLET EVERY DAY .  rosuvastatin (CRESTOR) 40 MG tablet, TAKE 1 TABLET EVERY DAY   Current Outpatient Medications (Respiratory):  [name]  ALLEGRA-D ALLERGY & CONGESTION 180-240 MG 24 hr tablet, TAKE 1 TABLET BY MOUTH DAILY .  Fluticasone-Salmeterol (ADVAIR DISKUS) 100-50 MCG/DOSE AEPB, Advair Diskus 100 mcg-50 mcg/dose powder for inhalation .  pseudoephedrine-acetaminophen (TYLENOL SINUS) 30-500 MG TABS tablet, Take 1 tablet by mouth as needed.   Current Outpatient Medications (Analgesics):  .  acetaminophen (TYLENOL) 500 MG tablet, Take 650 mg by mouth 3 (three) times daily as needed for pain.  .  naproxen sodium (ANAPROX) 220 MG tablet, Take 220 mg by mouth as needed. .  SUMAtriptan (IMITREX) 20 MG/ACT nasal spray, sumatriptan 20 mg/actuation nasal spray .  traMADol (ULTRAM) 50 MG tablet, tramadol 50 mg tablet   Current Outpatient Medications (Hematological):  .  clopidogrel (PLAVIX) 75 MG tablet, Take 1 tablet (75 mg total) by mouth daily. .  Ferrous Sulfate (IRON) 325 (65 FE) MG TABS, Take 1 tablet by mouth daily.     Current Outpatient Medications (Other):  [name]  Calcium Carbonate-Vitamin D (CALCIUM 600 + D PO), Take 2 tablets by mouth daily.   [name]  gabapentin (NEURONTIN) 100 MG capsule, TAKE 2 CAPSULES(200 MG) BY MOUTH AT BEDTIME .  glucosamine-chondroitin 500-400 MG tablet, Take 1 tablet by mouth 2 (two) times daily.   [name]  KLOR-CON M20 20 MEQ tablet, TAKE 1 TABLET EVERY DAY .  modafinil (PROVIGIL) 200 MG tablet, modafinil 200 mg tablet .  Multiple Vitamin (MULTIVITAMIN) tablet, Take 1 tablet by mouth daily.   .  Omega-3 Fatty Acids (FISH OIL) 1000 MG CAPS, Take 1 capsule by mouth daily. [name]  omeprazole (PRILOSEC) 20 MG  capsule, Take 20 mg by mouth daily as needed.  .  Probiotic Product (PROBIOTIC-10) CAPS, Take 1 capsule by mouth daily. [name]  pyridOXINE (VITAMIN B-6) 100 MG tablet, Take 200 mg by mouth daily. .  SF 5000 PLUS 1.1 % CREA dental cream,  .  Sod Fluoride-Potassium Nitrate (PREVIDENT 5000 SENSITIVE) 1.1-5 % PSTE, PreviDent 5000 Sensitive 1.1 %-5 % dental paste .  topiramate (TOPAMAX) 100 MG tablet, TAKE 1 TABLET EVERY DAY .  triamcinolone cream (KENALOG) 0.1 %, triamcinolone acetonide 0.1 % topical cream .  Turmeric 500 MG TABS, Take 2 tablets by mouth daily. [name]  venlafaxine XR (EFFEXOR-XR) 37.5 MG 24 hr capsule, Take 1 capsule (37.5 mg total) by mouth daily with breakfast. .  vitamin E 200 UNIT capsule, Take 200 Units by mouth daily. [name]  Zoster Vaccine Live, PF, (ZOSTAVAX) 27062 UNT/0.65ML injection, Zostavax (PF) 19,400 unit/0.65 mL subcutaneous suspension  Current Facility-Administered Medications (Other):  .  0.9 %  sodium chloride infusion    Past medical history, social, surgical and family history all reviewed in electronic medical record.  No pertanent information unless stated regarding to the chief complaint.   Review of Systems:  No headache, visual changes, nausea, vomiting, diarrhea, constipation, dizziness, abdominal pain, skin rash, fevers, chills, night sweats, weight loss, swollen lymph nodes, body aches, joint swelling, muscle aches, chest pain, shortness of breath, mood changes.   Objective  Blood pressure 110/68, pulse 89, height 5' 4"  (1.626 m), weight 208 lb (94.3 kg), SpO2 97 %.    General: No apparent distress alert and oriented x3 mood and affect normal, dressed appropriately.  HEENT: Pupils equal, extraocular movements intact  Respiratory: Patient's speak in full sentences and does not appear short of breath  Cardiovascular: 1+ lower extremity edema, non tender, no erythema  Skin: Warm dry intact with no signs of infection or rash on extremities or on axial skeleton.    Abdomen: Soft nontender  Neuro: Cranial nerves II through XII are intact, neurovascularly intact in all extremities with 2+ DTRs and 2+ pulses.  Lymph: No lymphadenopathy of posterior or anterior cervical chain or axillae bilaterally.  Gait antalgic MSK:  tender with full range of motion and good stability and symmetric strength and tone of shoulders, elbows, wrist, hip, knee and ankles bilaterally.  Significant arthritic changes of multiple joints Knee: Bilateral valgus deformity noted. Large thigh to calf ratio.  Tender to palpation over medial and PF joint line.  Less tenderness than previous exam ROM full in flexion and extension and lower leg rotation. instability with valgus force.  painful patellar compression. Patellar glide with moderate crepitus. Patellar and quadriceps tendons unremarkable. Hamstring and quadriceps strength is normal.   Impression and Recommendations:     This case required medical decision making of moderate complexity. The above documentation has been reviewed and is accurate and complete Lyndal Pulley, DO       Note: This dictation was prepared with Dragon dictation along with smaller phrase technology. Any transcriptional errors that result from this process are unintentional.

## 2017-12-02 NOTE — Assessment & Plan Note (Signed)
Discussed the importance again weight loss.  Encourage patient continue to monitor.

## 2017-12-02 NOTE — Assessment & Plan Note (Signed)
Patient is doing relatively well at this point.  Wants to hold on any Visco supplementation.  Discussed we can repeat injections every 10 weeks.  Encourage weight loss.  Follow-up again 6 to 8 weeks

## 2017-12-02 NOTE — Patient Instructions (Signed)
Good to see you  Ice is your friend Stay active B12 given today  Would take oral at least once a week as well  Lets watch the knees but see me again in 6-7 weeks in case we need to inject the knees again

## 2017-12-20 ENCOUNTER — Other Ambulatory Visit: Payer: Self-pay | Admitting: Endocrinology

## 2017-12-21 ENCOUNTER — Other Ambulatory Visit: Payer: Self-pay

## 2017-12-22 ENCOUNTER — Ambulatory Visit (INDEPENDENT_AMBULATORY_CARE_PROVIDER_SITE_OTHER): Payer: Medicare Other | Admitting: Family Medicine

## 2017-12-23 ENCOUNTER — Encounter (INDEPENDENT_AMBULATORY_CARE_PROVIDER_SITE_OTHER): Payer: Self-pay

## 2017-12-23 ENCOUNTER — Ambulatory Visit (INDEPENDENT_AMBULATORY_CARE_PROVIDER_SITE_OTHER): Payer: Medicare Other | Admitting: Family Medicine

## 2017-12-28 ENCOUNTER — Encounter: Payer: Self-pay | Admitting: Family Medicine

## 2018-01-04 NOTE — Progress Notes (Signed)
[name] [place], [place] [address] Phone: [phone] Subjective:   Heather [name], am serving as a scribe for Dr. Hulan Saas.   CC: Right leg pain  EBR:[id number]  Heather [name] is a 72 y.o. female coming in with complaint of back pain. She feels like her back pain has increased. She is having tingling in the lateral right thigh. She always has the tingling with varying intensity. Does feel like leg is going to give out on her. Pain with walking, sitting, standing, stairs. Patient explains that she does not have as much endurance.  States that she can only walk approximately 200 feet before she feels like the lingual way to give out on her of the pain in her back is too severe.  Would not consider it weakness in the leg but unfortunately does not feel stable  Patient at last exam did have x-rays done of the lumbar spine.  These were independently visualized by me showing moderate to severe degenerative disc disease with some degenerative scoliosis.  Past Medical History:  Diagnosis Date  . Abnormal Pap smear   . ALLERGIC RHINITIS 10/22/2006  . ANEMIA 12/18/2008  . Arthritis   . ASYMPTOMATIC POSTMENOPAUSAL STATUS 11/22/2007  . Back pain   . Complication of anesthesia   . Degenerative arthritis   . DIABETES MELLITUS, TYPE II 01/13/2007  . Dyslipidemia   . Fibroid   . GERD 07/20/2007  . GOITER, MULTINODULAR 07/20/2007  . Headache(784.0) 07/20/2007  . HEARING LOSS 11/22/2007  . Hiatal hernia   . Hx of colposcopy with cervical biopsy   . HYPERCHOLESTEROLEMIA 01/13/2007  . Hyperglycemia   . HYPERTENSION 10/22/2006  . NASH (nonalcoholic steatohepatitis)   . Nocturnal hypoxemia 02/17/2013  . Obesity   . Obstructive sleep apnea   . OSTEOARTHRITIS 10/22/2006  . Other chronic nonalcoholic liver disease 8/81/1031  . Sleep apnea    on cpap   Past Surgical History:  Procedure Laterality Date  . BREAST SURGERY     Breast reduction  .  CATARACT EXTRACTION, BILATERAL    . DEXA  08/2005  . DILATION AND CURETTAGE OF UTERUS    . ELECTROCARDIOGRAM  10/15/2006  . ESOPHAGOGASTRODUODENOSCOPY  12/08/2005  . Stress Cardiolite  10/21/2005  . sweat gland removal    . WISDOM TOOTH EXTRACTION     Social History   Socioeconomic History  . Marital status: Married    Spouse name: Heather [name]  . Number of children: 2  . Years of education: College  . Highest education level: Not on file  Occupational History  . Occupation: Retired  Scientific laboratory technician  . Financial resource strain: Not on file  . Food insecurity:    Worry: Not on file    Inability: Not on file  . Transportation needs:    Medical: Not on file    Non-medical: Not on file  Tobacco Use  . Smoking status: Former Smoker    Packs/day: 2.00    Years: 20.00    Pack years: 40.00    Types: Cigarettes    Last attempt to quit: 03/03/1979    Years since quitting: 38.8  . Smokeless tobacco: Never Used  Substance and Sexual Activity  . Alcohol use: Yes    Comment: rare  . Drug use: [name]  . Sexual activity: Yes    Birth control/protection: Surgical  Lifestyle  . Physical activity:    Days per week: Not on file    Minutes per session:  Not on file  . Stress: Not on file  Relationships  . Social connections:    Talks on phone: Not on file    Gets together: Not on file    Attends religious service: Not on file    Active member of club or organization: Not on file    Attends meetings of clubs or organizations: Not on file    Relationship status: Not on file  Other Topics Concern  . Not on file  Social History Narrative   Patient is married Heather [name]).   Patient has two children.   Patient is retired Designer, jewellery, Chiropodist    Patient has a Financial risk analyst.   Patient is right-handed.   Patient lives at home with family.   Caffeine Use: 2 soda every other day   Allergies  Allergen Reactions  . Aspirin   . Coconut Oil   .  Latex   . Lisinopril     REACTION: cough  . Metoprolol Itching  . Peanut-Containing Drug Products   . Penicillins     REACTION: rash  . Prednisone   . Strawberry Extract    Family History  Problem Relation Age of Onset  . Asthma Mother   . Depression Mother   . Bipolar disorder Mother   . Dementia Mother   . Arthritis Mother   . Breast cancer Mother        primary  . Colon cancer Mother        mets from breast  . Cancer Mother   . Hypertension Father   . Migraines Father   . Cancer Maternal Aunt        ?  [name] Heart disease Maternal Grandmother   . Cancer Maternal Grandfather        stomach ?    Current Outpatient Medications (Endocrine & Metabolic):  .  metFORMIN (GLUCOPHAGE-XR) 500 MG 24 hr tablet, Take 2 tablets (1,000 mg total) by mouth at bedtime.   Current Outpatient Medications (Cardiovascular):  .  furosemide (LASIX) 40 MG tablet, Take 1 tablet (40 mg total) by mouth daily. [name]  losartan (COZAAR) 25 MG tablet, TAKE 1 TABLET EVERY DAY .  rosuvastatin (CRESTOR) 40 MG tablet, Take 1 tablet (40 mg total) by mouth daily. At night   Current Outpatient Medications (Respiratory):  [name]  ALLEGRA-D ALLERGY & CONGESTION 180-240 MG 24 hr tablet, TAKE 1 TABLET BY MOUTH DAILY .  pseudoephedrine-acetaminophen (TYLENOL SINUS) 30-500 MG TABS tablet, Take 1 tablet by mouth as needed.   Current Outpatient Medications (Analgesics):  .  acetaminophen (TYLENOL) 500 MG tablet, Take 650 mg by mouth 3 (three) times daily as needed for pain.  .  naproxen sodium (ANAPROX) 220 MG tablet, Take 220 mg by mouth as needed. .  SUMAtriptan (IMITREX) 20 MG/ACT nasal spray, sumatriptan 20 mg/actuation nasal spray   Current Outpatient Medications (Hematological):  .  clopidogrel (PLAVIX) 75 MG tablet, Take 1 tablet (75 mg total) by mouth daily. .  Ferrous Sulfate (IRON) 325 (65 FE) MG TABS, Take 1 tablet by mouth daily.     Current Outpatient Medications (Other):  [name]  Calcium Carbonate-Vitamin D  (CALCIUM 600 + D PO), Take 2 tablets by mouth daily.   [name]  gabapentin (NEURONTIN) 100 MG capsule, TAKE 2 CAPSULES(200 MG) BY MOUTH AT BEDTIME .  glucosamine-chondroitin 500-400 MG tablet, Take 1 tablet by mouth 2 (two) times daily.   .  modafinil (PROVIGIL) 200 MG tablet, modafinil 200 mg tablet .  Multiple Vitamin (MULTIVITAMIN) tablet, Take 1 tablet by mouth daily.   .  Omega-3 Fatty Acids (FISH OIL) 1000 MG CAPS, Take 1 capsule by mouth daily. [name]  omeprazole (PRILOSEC) 20 MG capsule, Take 20 mg by mouth daily as needed.  .  potassium chloride SA (KLOR-CON M20) 20 MEQ tablet, Take 1 tablet (20 mEq total) by mouth daily. .  Probiotic Product (PROBIOTIC-10) CAPS, Take 1 capsule by mouth daily. [name]  pyridOXINE (VITAMIN B-6) 100 MG tablet, Take 200 mg by mouth daily. .  SF 5000 PLUS 1.1 % CREA dental cream,  .  topiramate (TOPAMAX) 100 MG tablet, TAKE 1 TABLET EVERY DAY .  triamcinolone cream (KENALOG) 0.1 %, triamcinolone acetonide 0.1 % topical cream .  Turmeric 500 MG TABS, Take 2 tablets by mouth daily. [name]  venlafaxine XR (EFFEXOR-XR) 37.5 MG 24 hr capsule, Take 1 capsule (37.5 mg total) by mouth daily with breakfast. .  vitamin E 200 UNIT capsule, Take 200 Units by mouth daily. [name]  Zoster Vaccine Live, PF, (ZOSTAVAX) 05110 UNT/0.65ML injection, Zostavax (PF) 19,400 unit/0.65 mL subcutaneous suspension  Current Facility-Administered Medications (Other):  .  0.9 %  sodium chloride infusion    Past medical history, social, surgical and family history all reviewed in electronic medical record.  [name] pertanent information unless stated regarding to the chief complaint.   Review of Systems:  [name] headache, visual changes, nausea, vomiting, diarrhea, constipation, dizziness, abdominal pain, skin rash, fevers, chills, night sweats, weight loss, swollen lymph nodes,   chest pain, shortness of breath, mood changes.  Positive muscle aches, body aches, joint swelling  Objective  Height 5' 4"  (1.626 m),  weight 208 lb (94.3 kg).     General: Mild apparent distress alert and oriented x3 mood and affect normal, dressed appropriately.  Appears very uncomfortable HEENT: Pupils equal, extraocular movements intact  Respiratory: Patient's speak in full sentences and does not appear short of breath  Cardiovascular: 1+ lower extremity edema, non tender, [name] erythema  Skin: Warm dry intact with [name] signs of infection or rash on extremities or on axial skeleton.  Abdomen: Soft nontender obese Neuro: Cranial nerves II through XII are intact, neurovascularly intact in all extremities with 2+ pulses.  Lymph: [name] lymphadenopathy of posterior or anterior cervical chain or axillae bilaterally.  Gait severely antalgic walking with the aid of a cane MSK:  tender with limited range of motion and stability and symmetric strength and tone of shoulders, elbows, wrist, hip, and ankles bilaterally.  Severe arthritic changes of the knees with instability noted.  Back exam shows the patient does have loss of lordosis of the lumbar spine.  Severe tenderness to palpation right greater than left in the paraspinal musculature.  Positive straight leg test on the right side.  Deep tendon reflexes 1+ of the patella and Achilles on the right compared to the left.  4 out of 5 strength of plantarflexion on the right compared to the left    Impression and Recommendations:     This case required medical decision making of moderate complexity. The above documentation has been reviewed and is accurate and complete Lyndal Pulley, DO       Note: This dictation was prepared with Dragon dictation along with smaller phrase technology. Any transcriptional errors that result from this process are unintentional.

## 2018-01-05 ENCOUNTER — Ambulatory Visit (INDEPENDENT_AMBULATORY_CARE_PROVIDER_SITE_OTHER): Payer: Medicare Other | Admitting: Internal Medicine

## 2018-01-05 ENCOUNTER — Encounter: Payer: Self-pay | Admitting: Internal Medicine

## 2018-01-05 VITALS — BP 118/64 | HR 78 | Temp 98.4°F | Ht 64.0 in | Wt 210.2 lb

## 2018-01-05 DIAGNOSIS — E785 Hyperlipidemia, unspecified: Secondary | ICD-10-CM | POA: Diagnosis not present

## 2018-01-05 DIAGNOSIS — R609 Edema, unspecified: Secondary | ICD-10-CM | POA: Diagnosis not present

## 2018-01-05 DIAGNOSIS — R918 Other nonspecific abnormal finding of lung field: Secondary | ICD-10-CM

## 2018-01-05 DIAGNOSIS — Z23 Encounter for immunization: Secondary | ICD-10-CM

## 2018-01-05 DIAGNOSIS — E7800 Pure hypercholesterolemia, unspecified: Secondary | ICD-10-CM

## 2018-01-05 DIAGNOSIS — E042 Nontoxic multinodular goiter: Secondary | ICD-10-CM

## 2018-01-05 DIAGNOSIS — E049 Nontoxic goiter, unspecified: Secondary | ICD-10-CM

## 2018-01-05 DIAGNOSIS — E119 Type 2 diabetes mellitus without complications: Secondary | ICD-10-CM | POA: Diagnosis not present

## 2018-01-05 DIAGNOSIS — K449 Diaphragmatic hernia without obstruction or gangrene: Secondary | ICD-10-CM | POA: Diagnosis not present

## 2018-01-05 DIAGNOSIS — R911 Solitary pulmonary nodule: Secondary | ICD-10-CM

## 2018-01-05 DIAGNOSIS — G4733 Obstructive sleep apnea (adult) (pediatric): Secondary | ICD-10-CM | POA: Diagnosis not present

## 2018-01-05 DIAGNOSIS — K219 Gastro-esophageal reflux disease without esophagitis: Secondary | ICD-10-CM | POA: Diagnosis not present

## 2018-01-05 DIAGNOSIS — Z9989 Dependence on other enabling machines and devices: Secondary | ICD-10-CM

## 2018-01-05 DIAGNOSIS — I2510 Atherosclerotic heart disease of native coronary artery without angina pectoris: Secondary | ICD-10-CM

## 2018-01-05 MED ORDER — FUROSEMIDE 40 MG PO TABS: 40.0000 mg | ORAL_TABLET | Freq: Every day | ORAL | 3 refills | Status: DC

## 2018-01-05 MED ORDER — POTASSIUM CHLORIDE CRYS ER 20 MEQ PO TBCR: 20.0000 meq | EXTENDED_RELEASE_TABLET | Freq: Every day | ORAL | 3 refills | Status: DC

## 2018-01-05 MED ORDER — ROSUVASTATIN CALCIUM 40 MG PO TABS: 40.0000 mg | ORAL_TABLET | Freq: Every day | ORAL | 3 refills | Status: DC

## 2018-01-05 MED ORDER — METFORMIN HCL ER 500 MG PO TB24: 1000.0000 mg | ORAL_TABLET | Freq: Every day | ORAL | 3 refills | Status: DC

## 2018-01-05 NOTE — Patient Instructions (Signed)
F/u in 3 months   Pulmonary Nodule A pulmonary nodule is a small, round growth of tissue in the lung. Pulmonary nodules can range in size from less than 1/5 inch (4 mm) to a little bigger than an inch (25 mm). Most pulmonary nodules are detected when imaging tests of the lung are being performed for a different problem. Pulmonary nodules are usually not cancerous (benign). However, some pulmonary nodules are cancerous (malignant). Follow-up treatment or testing is based on the size of the pulmonary nodule and your risk of getting lung cancer. What are the causes? Benign pulmonary nodules can be caused by various things. Some of the causes include:  Bacterial, fungal, or viral infections. This is usually an old infection that is no longer active, but it can sometimes be a current, active infection.  A benign mass of tissue.  Inflammation from conditions such as rheumatoid arthritis.  Abnormal blood vessels in the lungs.  Malignant pulmonary nodules can result from lung cancer or from cancers that spread to the lung from other places in the body. What are the signs or symptoms? Pulmonary nodules usually do not cause symptoms. How is this diagnosed? Most often, pulmonary nodules are found incidentally when an X-ray or CT scan is performed to look for some other problem in the lung area. To help determine whether a pulmonary nodule is benign or malignant, your health care provider will take a medical history and order a variety of tests. Tests done may include:  Blood tests.  A skin test called a tuberculin test. This test is used to determine if you have been exposed to the germ that causes tuberculosis.  Chest X-rays. If possible, a new X-ray may be compared with X-rays you have had in the past.  CT scan. This test shows smaller pulmonary nodules more clearly than an X-ray.  Positron emission tomography (PET) scan. In this test, a safe amount of a radioactive substance is injected into the  bloodstream. Then, the scan takes a picture of the pulmonary nodule. The radioactive substance is eliminated from your body in your urine.  Biopsy. A tiny piece of the pulmonary nodule is removed so it can be checked under a microscope.  How is this treated? Pulmonary nodules that are benign normally do not require any treatment because they usually do not cause symptoms or breathing problems. Your health care provider may want to monitor the pulmonary nodule through follow-up CT scans. The frequency of these CT scans will vary based on the size of the nodule and the risk factors for lung cancer. For example, CT scans will need to be done more frequently if the pulmonary nodule is larger and if you have a history of smoking and a family history of cancer. Further testing or biopsies may be done if any follow-up CT scan shows that the size of the pulmonary nodule has increased. Follow these instructions at home:  Only take over-the-counter or prescription medicines as directed by your health care provider.  Keep all follow-up appointments with your health care provider. Contact a health care provider if:  You have trouble breathing when you are active.  You feel sick or unusually tired.  You do not feel like eating.  You lose weight without trying to.  You develop chills or night sweats. Get help right away if:  You cannot catch your breath, or you begin wheezing.  You cannot stop coughing.  You cough up blood.  You become dizzy or feel like you are  going to pass out.  You have sudden chest pain.  You have a fever or persistent symptoms for more than 2-3 days.  You have a fever and your symptoms suddenly get worse. This information is not intended to replace advice given to you by your health care provider. Make sure you discuss any questions you have with your health care provider. Document Released: 12/14/2008 Document Revised: 07/25/2015 Document Reviewed: 08/08/2012 Elsevier  Interactive Patient Education  2017 [employer].

## 2018-01-05 NOTE — Progress Notes (Signed)
Pre visit review using our clinic review tool, if applicable. No additional management support is needed unless otherwise documented below in the visit note. 

## 2018-01-06 ENCOUNTER — Ambulatory Visit (INDEPENDENT_AMBULATORY_CARE_PROVIDER_SITE_OTHER): Payer: Medicare Other | Admitting: Family Medicine

## 2018-01-06 ENCOUNTER — Encounter: Payer: Self-pay | Admitting: Family Medicine

## 2018-01-06 ENCOUNTER — Encounter

## 2018-01-06 DIAGNOSIS — M5136 Other intervertebral disc degeneration, lumbar region: Secondary | ICD-10-CM

## 2018-01-06 DIAGNOSIS — M4807 Spinal stenosis, lumbosacral region: Secondary | ICD-10-CM

## 2018-01-06 DIAGNOSIS — M51369 Other intervertebral disc degeneration, lumbar region without mention of lumbar back pain or lower extremity pain: Secondary | ICD-10-CM

## 2018-01-06 DIAGNOSIS — I2510 Atherosclerotic heart disease of native coronary artery without angina pectoris: Secondary | ICD-10-CM

## 2018-01-06 NOTE — Patient Instructions (Signed)
Good to see you  Heather [name] is your friend We will get MRI of your back  Call [phone] to schedule.  After I get the MRI I will write you with the results and discuss our options

## 2018-01-06 NOTE — Assessment & Plan Note (Addendum)
Severe overall.  Discussed icing regimen and home exercise.  Discussed which activities to doing which wants to avoid.  Patient is to continue all medications at this moment.  I am concerned that the patient's degenerative disc disease is causing a severe spinal stenosis.  Discussed weakness.

## 2018-01-11 ENCOUNTER — Encounter: Payer: Self-pay | Admitting: Internal Medicine

## 2018-01-11 NOTE — Progress Notes (Addendum)
Chief Complaint  Patient presents with  . Transitions Of Care   TOC former PCP Dr. Loanne Drilling with husband [name] today   1. DM 2 A1C 12/01/17 6.0 on Metformin XR 500 mg 1000 mg qhs f/u [place] [facility] and Dr. Milinda Pointer  2. Leg edema 1-2+ left >right chronic issue  3. H/o GERD with large hiatal hernia on prilosec 20 mg qd  4. H/o thyroid nodule, MNG no recent thyroid US 5. OSA on cpap per neurology and Provigil  6. H/o lung nodule former smoker quit in 38s smoked 2 ppd   Review of Systems  Constitutional: Positive for weight loss.       70 lbs trying  HENT: Negative for hearing loss.   Eyes: Negative for blurred vision.  Respiratory: Negative for shortness of breath.   Cardiovascular: Positive for leg swelling. Negative for chest pain.  Gastrointestinal: Positive for heartburn. Negative for abdominal pain.  Musculoskeletal: Negative for falls.  Skin: Negative for rash.  Neurological: Negative for headaches.  Psychiatric/Behavioral: Positive for memory loss. Negative for depression.   Past Medical History:  Diagnosis Date  . Abnormal Pap smear   . ALLERGIC RHINITIS 10/22/2006  . ANEMIA 12/18/2008  . Arthritis   . ASYMPTOMATIC POSTMENOPAUSAL STATUS 11/22/2007  . Back pain   . Complication of anesthesia   . Degenerative arthritis   . DIABETES MELLITUS, TYPE II 01/13/2007  . Dyslipidemia   . Fibroid   . GERD 07/20/2007  . GOITER, MULTINODULAR 07/20/2007  . Headache(784.0) 07/20/2007  . HEARING LOSS 11/22/2007  . Hiatal hernia   . Hx of colposcopy with cervical biopsy   . HYPERCHOLESTEROLEMIA 01/13/2007  . Hyperglycemia   . HYPERTENSION 10/22/2006  . NASH (nonalcoholic steatohepatitis)   . Nocturnal hypoxemia 02/17/2013  . Obesity   . Obstructive sleep apnea   . OSTEOARTHRITIS 10/22/2006  . Other chronic nonalcoholic liver disease 2/77/8242  . Sleep apnea    on cpap   Past Surgical History:  Procedure Laterality Date  . BREAST SURGERY     Breast reduction  . CATARACT EXTRACTION,  BILATERAL    . DEXA  08/2005  . DILATION AND CURETTAGE OF UTERUS    . ELECTROCARDIOGRAM  10/15/2006  . ESOPHAGOGASTRODUODENOSCOPY  12/08/2005  . Stress Cardiolite  10/21/2005  . sweat gland removal    . WISDOM TOOTH EXTRACTION     Family History  Problem Relation Age of Onset  . Asthma Mother   . Depression Mother   . Bipolar disorder Mother   . Dementia Mother   . Arthritis Mother   . Breast cancer Mother        primary  . Colon cancer Mother        mets from breast  . Cancer Mother   . Hypertension Father   . Migraines Father   . Cancer Maternal Aunt        ?  [name] Heart disease Maternal Grandmother   . Cancer Maternal Grandfather        stomach ?   Social History   Socioeconomic History  . Marital status: Married    Spouse name: [name]  . Number of children: 2  . Years of education: College  . Highest education level: Not on file  Occupational History  . Occupation: Retired  Scientific laboratory technician  . Financial resource strain: Not on file  . Food insecurity:    Worry: Not on file    Inability: Not on file  . Transportation needs:    Medical: Not on  file    Non-medical: Not on file  Tobacco Use  . Smoking status: Former Smoker    Packs/day: 2.00    Years: 20.00    Pack years: 40.00    Types: Cigarettes    Last attempt to quit: 03/03/1979    Years since quitting: 38.8  . Smokeless tobacco: Never Used  Substance and Sexual Activity  . Alcohol use: Yes    Comment: rare  . Drug use: No  . Sexual activity: Yes    Birth control/protection: Surgical  Lifestyle  . Physical activity:    Days per week: Not on file    Minutes per session: Not on file  . Stress: Not on file  Relationships  . Social connections:    Talks on phone: Not on file    Gets together: Not on file    Attends religious service: Not on file    Active member of club or organization: Not on file    Attends meetings of clubs or organizations: Not on file    Relationship status: Not on file  .  Intimate partner violence:    Fear of current or ex partner: Not on file    Emotionally abused: Not on file    Physically abused: Not on file    Forced sexual activity: Not on file  Other Topics Concern  . Not on file  Social History Narrative   Patient is married [name]).   Patient has two children.   Patient is retired Designer, jewellery, Chiropodist    Patient has a Financial risk analyst.   Patient is right-handed.   Patient lives at home with family.   Caffeine Use: 2 soda every other day   No outpatient medications have been marked as taking for the 01/05/18 encounter (Office Visit) with McLean-Scocuzza, [name], MD.   Current Facility-Administered Medications for the 01/05/18 encounter (Office Visit) with McLean-Scocuzza, [name], MD  Medication  . 0.9 %  sodium chloride infusion   Allergies  Allergen Reactions  . Aspirin   . Coconut Oil   . Latex   . Lisinopril     REACTION: cough  . Metoprolol Itching  . Peanut-Containing Drug Products   . Penicillins     REACTION: rash  . Prednisone   . Strawberry Extract    Recent Results (from the past 2160 hour(s))  Comprehensive metabolic panel     Status: None   Collection Time: 12/01/17 11:46 AM  Result Value Ref Range   Glucose 83 65 - 99 mg/dL   BUN 10 8 - 27 mg/dL   Creatinine, Ser 0.68 0.57 - 1.00 mg/dL   GFR calc non Af Amer 88 >59 mL/min/1.73   GFR calc Af Amer 102 >59 mL/min/1.73   BUN/Creatinine Ratio 15 12 - 28   Sodium 142 134 - 144 mmol/L   Potassium 3.9 3.5 - 5.2 mmol/L   Chloride 103 96 - 106 mmol/L   CO2 27 20 - 29 mmol/L   Calcium 9.2 8.7 - 10.3 mg/dL   Total Protein 7.0 6.0 - 8.5 g/dL   Albumin 4.1 3.5 - 4.8 g/dL   Globulin, Total 2.9 1.5 - 4.5 g/dL   Albumin/Globulin Ratio 1.4 1.2 - 2.2   Bilirubin Total <0.2 0.0 - 1.2 mg/dL   Alkaline Phosphatase 78 39 - 117 IU/L   AST 20 0 - 40 IU/L   ALT 20 0 - 32 IU/L  CBC With Differential     Status: Abnormal  Collection Time: 12/01/17 11:46 AM  Result Value Ref Range   WBC 4.0 3.4 - 10.8 x10E3/uL   RBC 4.35 3.77 - 5.28 x10E6/uL   Hemoglobin 12.4 11.1 - 15.9 g/dL   Hematocrit 39.5 34.0 - 46.6 %   MCV 91 79 - 97 fL   MCH 28.5 26.6 - 33.0 pg   MCHC 31.4 (L) 31.5 - 35.7 g/dL   RDW 16.2 (H) 12.3 - 15.4 %   Neutrophils 57 Not Estab. %   Lymphs 29 Not Estab. %   Monocytes 10 Not Estab. %   Eos 3 Not Estab. %   Basos 1 Not Estab. %   Neutrophils Absolute 2.3 1.4 - 7.0 x10E3/uL   Lymphocytes Absolute 1.2 0.7 - 3.1 x10E3/uL   Monocytes Absolute 0.4 0.1 - 0.9 x10E3/uL   EOS (ABSOLUTE) 0.1 0.0 - 0.4 x10E3/uL   Basophils Absolute 0.0 0.0 - 0.2 x10E3/uL   Immature Granulocytes 0 Not Estab. %   Immature Grans (Abs) 0.0 0.0 - 0.1 x10E3/uL  Hemoglobin A1c     Status: Abnormal   Collection Time: 12/01/17 11:46 AM  Result Value Ref Range   Hgb A1c MFr Bld 6.0 (H) 4.8 - 5.6 %    Comment:          Prediabetes: 5.7 - 6.4          Diabetes: >6.4          Glycemic control for adults with diabetes: <7.0    Est. average glucose Bld gHb Est-mCnc 126 mg/dL  Insulin, random     Status: None   Collection Time: 12/01/17 11:46 AM  Result Value Ref Range   INSULIN 5.6 2.6 - 24.9 uIU/mL  Lipid Panel With LDL/HDL Ratio     Status: None   Collection Time: 12/01/17 11:46 AM  Result Value Ref Range   Cholesterol, Total 178 100 - 199 mg/dL   Triglycerides 66 0 - 149 mg/dL   HDL 76 >39 mg/dL   VLDL Cholesterol Cal 13 5 - 40 mg/dL   LDL Calculated 89 0 - 99 mg/dL   LDl/HDL Ratio 1.2 0.0 - 3.2 ratio    Comment:                                     LDL/HDL Ratio                                             Men  Women                               1/2 Avg.Risk  1.0    1.5                                   Avg.Risk  3.6    3.2                                2X Avg.Risk  6.2    5.0  3X Avg.Risk  8.0    6.1   VITAMIN D 25 Hydroxy (Vit-D Deficiency, Fractures)     Status: None   Collection  Time: 12/01/17 11:46 AM  Result Value Ref Range   Vit D, 25-Hydroxy 34.2 30.0 - 100.0 ng/mL    Comment: Vitamin D deficiency has been defined by the [place] practice guideline as a level of serum 25-OH vitamin D less than 20 ng/mL (1,2). The Endocrine Society went on to further define vitamin D insufficiency as a level between 21 and 29 ng/mL (2). 1. IOM (Institute of Medicine). 2010. Dietary reference    intakes for calcium and D. [place]: The    [employer]. 2. Holick MF, Binkley [place], Bischoff-Ferrari HA, et al.    Evaluation, treatment, and prevention of vitamin D    deficiency: an Endocrine Society clinical practice    guideline. JCEM. 2011 Jul; 96(7):1911-30.    Objective  Body mass index is 36.08 kg/m. Wt Readings from Last 3 Encounters:  01/06/18 208 lb (94.3 kg)  01/05/18 210 lb 3.2 oz (95.3 kg)  12/02/17 208 lb (94.3 kg)   Temp Readings from Last 3 Encounters:  01/05/18 98.4 F (36.9 C) (Oral)  12/01/17 97.8 F (36.6 C) (Oral)  11/09/17 97.9 F (36.6 C) (Oral)   BP Readings from Last 3 Encounters:  01/05/18 118/64  12/02/17 110/68  12/01/17 108/70   Pulse Readings from Last 3 Encounters:  01/05/18 78  12/02/17 89  12/01/17 74    Physical Exam  Constitutional: She is oriented to person, place, and time. Vital signs are normal. She appears well-developed and well-nourished. She is cooperative.  HENT:  Head: Normocephalic and atraumatic.  Mouth/Throat: Oropharynx is clear and moist and mucous membranes are normal.  Eyes: Pupils are equal, round, and reactive to light. Conjunctivae are normal.  Cardiovascular: Normal rate, regular rhythm and normal heart sounds.  C/w PVCs on exam today  1-2+ leg edema L>R  Pulmonary/Chest: Effort normal and breath sounds normal.  Neurological: She is alert and oriented to person, place, and time. Gait normal.  Skin: Skin is warm, dry and intact.  Psychiatric: She has  a normal mood and affect. Her speech is normal and behavior is normal. Judgment and thought content normal. Cognition and memory are normal.  Nursing note and vitals reviewed.   Assessment   1. DM 2 A1C 12/01/17 6.0, h/o hLD with CAD 2. Leg edema 1-2+ left >right  3. H/o GERD with large hiatal hernia  03/30/2018 seen by Dr. Mickle Asper. Hiatal hernia observe for now rtc if chest pain, epigastric pain, dysphagia, GERD, sob  4. H/o thyroid nodule, MNG 5. OSA on cpap per neurology  6. H/o lung nodule former smoker  7. HM Plan  1.  Eye [place] [facility]  Foot MD Dr. Milinda Pointer  A1C had 12/01/17 6.0  On metformin continue A1C at goal On statin  2. Trial of lasix 40 mg qd  3. Repeat CT chest f/u hiatal hernia  Cont prilosec 20 mg qd  4. Repeat thyroid US to f/u  5. F/u neurology  On cpap  6. Repeat CT chest h/o lung nodule in former smoker  7.  Flu shot high dose given today  prevnar had 01/30/14 pna 23 had 12/18/08 due again  zostavax had 01/30/14, consider shingrix   Tdap 01/30/14   DEXA 03/06/15 normal  mammo copy get from Dr. [facility]  Pap out of age window Colonoscopy 06/18/16  severe diverticulosis polyp x 1 tubular adenoma FH mom colon cancer f/u in 5 years  HCV neg 12/28/11   Down 70s #s trying weight stable since 09/2017 f/u with [facility] Dr. Leafy Ro, congratulated  Former smoker quit in 50s smoker x 20 years+ 2 ppd  Reviewed labs 12/01/17 had CMET, CBC, lipid, A1C, vitamin D, TSH had 12/23/16 normal, will need to check UA in future and urine protein  Of note pt no longer taking Advair, tramadol, probiotic, TMC cream  Eye MD Dr. [place]  Cardiologist Dr. [name]   Provider: Dr. Olivia Mackie McLean-Scocuzza-Internal Medicine

## 2018-01-11 NOTE — Progress Notes (Deleted)
Pre visit review using our clinic review tool, if applicable. No additional management support is needed unless otherwise documented below in the visit note. 

## 2018-01-12 ENCOUNTER — Encounter: Payer: Self-pay | Admitting: Internal Medicine

## 2018-01-13 ENCOUNTER — Ambulatory Visit (INDEPENDENT_AMBULATORY_CARE_PROVIDER_SITE_OTHER): Payer: Medicare Other | Admitting: Family Medicine

## 2018-01-13 VITALS — BP 105/66 | HR 80 | Temp 97.6°F | Ht 64.0 in | Wt 199.0 lb

## 2018-01-13 DIAGNOSIS — E669 Obesity, unspecified: Secondary | ICD-10-CM | POA: Diagnosis not present

## 2018-01-13 DIAGNOSIS — Z6834 Body mass index (BMI) 34.0-34.9, adult: Secondary | ICD-10-CM

## 2018-01-13 DIAGNOSIS — E119 Type 2 diabetes mellitus without complications: Secondary | ICD-10-CM | POA: Diagnosis not present

## 2018-01-14 ENCOUNTER — Ambulatory Visit
Admission: RE | Admit: 2018-01-14 | Discharge: 2018-01-14 | Disposition: A | Discharge status: Home / Self Care | Payer: Medicare Other | Source: Ambulatory Visit | Attending: Internal Medicine | Admitting: Internal Medicine

## 2018-01-14 DIAGNOSIS — E049 Nontoxic goiter, unspecified: Secondary | ICD-10-CM

## 2018-01-14 DIAGNOSIS — R918 Other nonspecific abnormal finding of lung field: Secondary | ICD-10-CM

## 2018-01-14 DIAGNOSIS — R911 Solitary pulmonary nodule: Secondary | ICD-10-CM | POA: Diagnosis not present

## 2018-01-14 DIAGNOSIS — E042 Nontoxic multinodular goiter: Secondary | ICD-10-CM | POA: Diagnosis not present

## 2018-01-17 ENCOUNTER — Ambulatory Visit (HOSPITAL_COMMUNITY)
Admission: RE | Admit: 2018-01-17 | Discharge: 2018-01-17 | Disposition: A | Discharge status: Home / Self Care | Payer: Medicare Other | Source: Ambulatory Visit | Attending: Internal Medicine | Admitting: Internal Medicine

## 2018-01-17 ENCOUNTER — Ambulatory Visit (HOSPITAL_BASED_OUTPATIENT_CLINIC_OR_DEPARTMENT_OTHER)
Admission: RE | Admit: 2018-01-17 | Discharge: 2018-01-17 | Disposition: A | Discharge status: Home / Self Care | Payer: Medicare Other | Source: Ambulatory Visit | Attending: Cardiology | Admitting: Cardiology

## 2018-01-17 ENCOUNTER — Encounter (HOSPITAL_COMMUNITY): Payer: Self-pay | Admitting: Cardiology

## 2018-01-17 VITALS — BP 126/68 | HR 74 | Wt 202.4 lb

## 2018-01-17 DIAGNOSIS — M25562 Pain in left knee: Secondary | ICD-10-CM | POA: Insufficient documentation

## 2018-01-17 DIAGNOSIS — I10 Essential (primary) hypertension: Secondary | ICD-10-CM

## 2018-01-17 DIAGNOSIS — Z7902 Long term (current) use of antithrombotics/antiplatelets: Secondary | ICD-10-CM | POA: Diagnosis not present

## 2018-01-17 DIAGNOSIS — Z7984 Long term (current) use of oral hypoglycemic drugs: Secondary | ICD-10-CM | POA: Diagnosis not present

## 2018-01-17 DIAGNOSIS — E119 Type 2 diabetes mellitus without complications: Secondary | ICD-10-CM | POA: Insufficient documentation

## 2018-01-17 DIAGNOSIS — I5032 Chronic diastolic (congestive) heart failure: Secondary | ICD-10-CM | POA: Diagnosis not present

## 2018-01-17 DIAGNOSIS — M1990 Unspecified osteoarthritis, unspecified site: Secondary | ICD-10-CM | POA: Insufficient documentation

## 2018-01-17 DIAGNOSIS — E7800 Pure hypercholesterolemia, unspecified: Secondary | ICD-10-CM | POA: Diagnosis not present

## 2018-01-17 DIAGNOSIS — Z886 Allergy status to analgesic agent status: Secondary | ICD-10-CM | POA: Insufficient documentation

## 2018-01-17 DIAGNOSIS — E785 Hyperlipidemia, unspecified: Secondary | ICD-10-CM | POA: Insufficient documentation

## 2018-01-17 DIAGNOSIS — M25561 Pain in right knee: Secondary | ICD-10-CM | POA: Insufficient documentation

## 2018-01-17 DIAGNOSIS — G4733 Obstructive sleep apnea (adult) (pediatric): Secondary | ICD-10-CM | POA: Diagnosis not present

## 2018-01-17 DIAGNOSIS — Z87891 Personal history of nicotine dependence: Secondary | ICD-10-CM | POA: Insufficient documentation

## 2018-01-17 DIAGNOSIS — E669 Obesity, unspecified: Secondary | ICD-10-CM | POA: Diagnosis not present

## 2018-01-17 DIAGNOSIS — Z6834 Body mass index (BMI) 34.0-34.9, adult: Secondary | ICD-10-CM | POA: Diagnosis not present

## 2018-01-17 DIAGNOSIS — I2510 Atherosclerotic heart disease of native coronary artery without angina pectoris: Secondary | ICD-10-CM | POA: Insufficient documentation

## 2018-01-17 DIAGNOSIS — K219 Gastro-esophageal reflux disease without esophagitis: Secondary | ICD-10-CM | POA: Insufficient documentation

## 2018-01-17 DIAGNOSIS — Z79899 Other long term (current) drug therapy: Secondary | ICD-10-CM | POA: Diagnosis not present

## 2018-01-17 MED ORDER — CLOPIDOGREL BISULFATE 75 MG PO TABS: 75.0000 mg | ORAL_TABLET | Freq: Every day | ORAL | 3 refills | Status: DC

## 2018-01-17 NOTE — Progress Notes (Signed)
Office: [phone]  /  Fax: [phone]   HPI:   Chief Complaint: OBESITY Heather [name] is here to discuss her progress with her obesity treatment plan. She is on the lower carbohydrate, vegetable and lean protein rich diet plan and is following her eating plan approximately 98 % of the time. She states she is exercising 0 minutes 0 times per week. Heather [name] was changed to low carbohydrate plan and feels she is doing very well with weight loss. She would like to continue with this plan a while longer.  Her weight is 199 lb (90.3 kg) today and has had a weight loss of 5 pounds over a period of 6 weeks since her last visit. She has lost 69 lbs since starting treatment with [name].  Diabetes II Heather [name] has a diagnosis of diabetes type II. Heather [name] is stable on metformin and she denies nausea, vomiting, or hypoglycemia. She is doing well with decreasing simple carbohydrates. Last A1c was 6.0. She has been working on intensive lifestyle modifications including diet, exercise, and weight loss to help control her blood glucose levels.  ALLERGIES: Allergies  Allergen Reactions  . Aspirin   . Coconut Oil   . Latex   . Lisinopril     REACTION: cough  . Metoprolol Itching  . Peanut-Containing Drug Products   . Penicillins     REACTION: rash  . Prednisone   . Strawberry Extract     MEDICATIONS: Current Outpatient Medications on File Prior to Visit  Medication Sig Dispense Refill  . acetaminophen (TYLENOL) 500 MG tablet Take 650 mg by mouth 3 (three) times daily as needed for pain.     [name] ALLEGRA-D ALLERGY & CONGESTION 180-240 MG 24 hr tablet TAKE 1 TABLET BY MOUTH DAILY 30 tablet 1  . Calcium Carbonate-Vitamin D (CALCIUM 600 + D PO) Take 2 tablets by mouth daily.      . Ferrous Sulfate (IRON) 325 (65 FE) MG TABS Take 1 tablet by mouth daily.      . furosemide (LASIX) 40 MG tablet Take 1 tablet (40 mg total) by mouth daily. 90 tablet 3  . gabapentin (NEURONTIN) 100 MG capsule TAKE 2 CAPSULES(200 MG) BY  MOUTH AT BEDTIME 180 capsule 1  . glucosamine-chondroitin 500-400 MG tablet Take 1 tablet by mouth 2 (two) times daily.      [name] losartan (COZAAR) 25 MG tablet TAKE 1 TABLET EVERY DAY    . metFORMIN (GLUCOPHAGE-XR) 500 MG 24 hr tablet Take 2 tablets (1,000 mg total) by mouth at bedtime. 180 tablet 3  . modafinil (PROVIGIL) 200 MG tablet modafinil 200 mg tablet    . Multiple Vitamin (MULTIVITAMIN) tablet Take 1 tablet by mouth daily.      . naproxen sodium (ANAPROX) 220 MG tablet Take 220 mg by mouth as needed.    . Omega-3 Fatty Acids (FISH OIL) 1000 MG CAPS Take 1 capsule by mouth daily.    [name] omeprazole (PRILOSEC) 20 MG capsule Take 20 mg by mouth daily as needed.     . potassium chloride SA (KLOR-CON M20) 20 MEQ tablet Take 1 tablet (20 mEq total) by mouth daily. 90 tablet 3  . pseudoephedrine-acetaminophen (TYLENOL SINUS) 30-500 MG TABS tablet Take 1 tablet by mouth as needed.    . pyridOXINE (VITAMIN B-6) 100 MG tablet Take 200 mg by mouth daily.    . rosuvastatin (CRESTOR) 40 MG tablet Take 1 tablet (40 mg total) by mouth daily. At night 90 tablet 3  . SF 5000 PLUS 1.1 %  CREA dental cream   0  . SUMAtriptan (IMITREX) 20 MG/ACT nasal spray sumatriptan 20 mg/actuation nasal spray    . topiramate (TOPAMAX) 100 MG tablet TAKE 1 TABLET EVERY DAY 90 tablet 3  . Turmeric 500 MG TABS Take 2 tablets by mouth daily.    [name] venlafaxine XR (EFFEXOR-XR) 37.5 MG 24 hr capsule Take 1 capsule (37.5 mg total) by mouth daily with breakfast. 90 capsule 1  . vitamin E 200 UNIT capsule Take 200 Units by mouth daily.    [name] Zoster Vaccine Live, PF, (ZOSTAVAX) 22025 UNT/0.65ML injection Zostavax (PF) 19,400 unit/0.65 mL subcutaneous suspension     Current Facility-Administered Medications on File Prior to Visit  Medication Dose Route Frequency Provider Last Rate Last Dose  . 0.9 %  sodium chloride infusion  500 mL Intravenous Continuous Nandigam, Venia Minks, MD        PAST MEDICAL HISTORY: Past Medical History:    Diagnosis Date  . Abdominal hernia   . Abnormal Pap smear   . ALLERGIC RHINITIS 10/22/2006  . Allergy   . ANEMIA 12/18/2008  . Arthritis    scoliosis moderate deg changes lumbar Xray 11/04/17   . ASYMPTOMATIC POSTMENOPAUSAL STATUS 11/22/2007  . Back pain   . CAD (coronary artery disease)    in LAD  . Complication of anesthesia   . Degenerative arthritis   . DIABETES MELLITUS, TYPE II 01/13/2007  . Dyslipidemia   . Fibroid   . Frequent headaches   . GERD 07/20/2007  . GOITER, MULTINODULAR 07/20/2007  . Headache(784.0) 07/20/2007  . HEARING LOSS 11/22/2007  . Hiatal hernia   . Hiatal hernia    large  . History of chicken pox   . Hx of colposcopy with cervical biopsy   . HYPERCHOLESTEROLEMIA 01/13/2007  . Hyperglycemia   . HYPERTENSION 10/22/2006  . Lung nodule    unchanged since 05/26/13   . Migraines   . NASH (nonalcoholic steatohepatitis)   . Nocturnal hypoxemia 02/17/2013  . Obesity   . Obstructive sleep apnea   . OSA on CPAP    per neurology  . OSTEOARTHRITIS 10/22/2006  . Other chronic nonalcoholic liver disease 06/26/621  . Sedimentation rate elevation   . Sleep apnea    on cpap  . Urine incontinence   . UTI (urinary tract infection)     PAST SURGICAL HISTORY: Past Surgical History:  Procedure Laterality Date  . BREAST SURGERY  1984   Breast reduction b/l   . CATARACT EXTRACTION, BILATERAL    . DEXA  08/2005  . DILATION AND CURETTAGE OF UTERUS    . ELECTROCARDIOGRAM  10/15/2006  . ESOPHAGOGASTRODUODENOSCOPY  12/08/2005  . FOOT SURGERY     hammertoe and bunion 05/2017   . Stress Cardiolite  10/21/2005  . sweat gland removal    . WISDOM TOOTH EXTRACTION      SOCIAL HISTORY: Social History   Tobacco Use  . Smoking status: Former Smoker    Packs/day: 2.00    Years: 20.00    Pack years: 40.00    Types: Cigarettes    Last attempt to quit: 03/03/1979    Years since quitting: 38.9  . Smokeless tobacco: Never Used  Substance Use Topics  . Alcohol use: Yes     Comment: rare  . Drug use: No    FAMILY HISTORY: Family History  Problem Relation Age of Onset  . Asthma Mother   . Depression Mother   . Bipolar disorder Mother   . Dementia Mother   .  Arthritis Mother   . Breast cancer Mother        primary  . Colon cancer Mother        mets from breast  . Cancer Mother        colon  . Hypertension Father   . Migraines Father   . Cancer Maternal Aunt        ?  [name] Heart disease Maternal Grandmother   . Cancer Maternal Grandfather        stomach ?    ROS: Review of Systems  Constitutional: Positive for weight loss.  Gastrointestinal: Negative for nausea and vomiting.  Endo/Heme/Allergies:       Negative hypoglycemia    PHYSICAL EXAM: Blood pressure 105/66, pulse 80, temperature 97.6 F (36.4 C), temperature source Oral, height 5' 4"  (1.626 m), weight 199 lb (90.3 kg), SpO2 100 %. Body mass index is 34.16 kg/m. Physical Exam  Constitutional: She is oriented to person, place, and time. She appears well-developed and well-nourished.  Cardiovascular: Normal rate.  Pulmonary/Chest: Effort normal.  Musculoskeletal: Normal range of motion.  Neurological: She is oriented to person, place, and time.  Skin: Skin is warm and dry.  Psychiatric: She has a normal mood and affect. Her behavior is normal.  Vitals reviewed.   RECENT LABS AND TESTS: BMET    Component Value Date/Time   NA 142 12/01/2017 1146   K 3.9 12/01/2017 1146   CL 103 12/01/2017 1146   CO2 27 12/01/2017 1146   GLUCOSE 83 12/01/2017 1146   GLUCOSE 116 (H) 03/05/2016 0958   BUN 10 12/01/2017 1146   CREATININE 0.68 12/01/2017 1146   CREATININE 0.81 12/28/2011 0853   CALCIUM 9.2 12/01/2017 1146   GFRNONAA 88 12/01/2017 1146   GFRAA 102 12/01/2017 1146   Lab Results  Component Value Date   HGBA1C 6.0 (H) 12/01/2017   HGBA1C 5.7 (H) 07/29/2017   HGBA1C 5.9 03/15/2017   HGBA1C 6.3 (H) 12/23/2016   HGBA1C 6.3 09/08/2016   Lab Results  Component Value Date    INSULIN 5.6 12/01/2017   INSULIN 6.8 07/29/2017   INSULIN 20.9 12/23/2016   CBC    Component Value Date/Time   WBC 4.0 12/01/2017 1146   WBC 7.2 03/05/2016 0958   RBC 4.35 12/01/2017 1146   RBC 4.46 03/05/2016 0958   HGB 12.4 12/01/2017 1146   HCT 39.5 12/01/2017 1146   PLT 300.0 03/05/2016 0958   MCV 91 12/01/2017 1146   MCH 28.5 12/01/2017 1146   MCH 29.4 12/28/2011 0853   MCHC 31.4 (L) 12/01/2017 1146   MCHC 32.9 03/05/2016 0958   RDW 16.2 (H) 12/01/2017 1146   LYMPHSABS 1.2 12/01/2017 1146   MONOABS 0.4 03/05/2016 0958   EOSABS 0.1 12/01/2017 1146   BASOSABS 0.0 12/01/2017 1146   Iron/TIBC/Ferritin/ %Sat    Component Value Date/Time   IRON 38 12/23/2016 0956   TIBC 271 12/23/2016 0956   FERRITIN 40 12/23/2016 0956   IRONPCTSAT 14 (L) 12/23/2016 0956   Lipid Panel     Component Value Date/Time   CHOL 178 12/01/2017 1146   TRIG 66 12/01/2017 1146   HDL 76 12/01/2017 1146   CHOLHDL 3 03/05/2016 0958   VLDL 26.0 03/05/2016 0958   LDLCALC 89 12/01/2017 1146   Hepatic Function Panel     Component Value Date/Time   PROT 7.0 12/01/2017 1146   ALBUMIN 4.1 12/01/2017 1146   AST 20 12/01/2017 1146   ALT 20 12/01/2017 1146   ALKPHOS 78 12/01/2017  1146   BILITOT <0.2 12/01/2017 1146   BILIDIR 0.1 02/15/2015 1010   IBILI 0.2 12/28/2011 0853      Component Value Date/Time   TSH 1.170 12/23/2016 0956   TSH 1.76 03/05/2016 0958   TSH 1.86 02/15/2015 1010    ASSESSMENT AND PLAN: Type 2 diabetes mellitus without complication, without long-term current use of insulin (HCC)  Class 1 obesity with serious comorbidity and body mass index (BMI) of 34.0 to 34.9 in adult, unspecified obesity type  PLAN:  Diabetes II Heather [name] has been given extensive diabetes education by myself today including ideal fasting and post-prandial blood glucose readings, individual ideal Hgb A1c goals and hypoglycemia prevention. We discussed the importance of good blood sugar control to  decrease the likelihood of diabetic complications such as nephropathy, neuropathy, limb loss, blindness, coronary artery disease, and death. We discussed the importance of intensive lifestyle modification including diet, exercise and weight loss as the first line treatment for diabetes. Heather [name] agrees to continue her diabetes medications, diet, exercise, and weight loss. We will recheck labs in 2 months. Heather [name] agrees to follow up with our clinic in 4 weeks.  I spent > than 50% of the 25 minute visit on counseling as documented in the note.  Obesity Heather [name] is currently in the action stage of change. As such, her goal is to continue with weight loss efforts She has agreed to follow a lower carbohydrate, vegetable and lean protein rich diet plan Heather [name] has been instructed to work up to a goal of 150 minutes of combined cardio and strengthening exercise per week for weight loss and overall health benefits. We discussed the following Behavioral Modification Strategies today: increasing lean protein intake, decreasing simple carbohydrates, holiday eating strategies, and keeping healthy foods in the home    Heather [name] has agreed to follow up with our clinic in 4 weeks. She was informed of the importance of frequent follow up visits to maximize her success with intensive lifestyle modifications for her multiple health conditions.   OBESITY BEHAVIORAL INTERVENTION VISIT  Today's visit was # 20   Starting weight: 268 lbs Starting date: 12/23/16 Today's weight : 199 lbs  Today's date: 01/13/2018 Total lbs lost to date: 8    ASK: We discussed the diagnosis of obesity with Heather [name] today and Heather [name] agreed to give [name] permission to discuss obesity behavioral modification therapy today.  ASSESS: Heather [name] has the diagnosis of obesity and her BMI today is 34.14 Heather [name] is in the action stage of change   ADVISE: Heather [name] was educated on the multiple health risks of obesity as well as the benefit of weight  loss to improve her health. She was advised of the need for long term treatment and the importance of lifestyle modifications to improve her current health and to decrease her risk of future health problems.  AGREE: Multiple dietary modification options and treatment options were discussed and  Heather [name] agreed to follow the recommendations documented in the above note.  ARRANGE: Heather [name] was educated on the importance of frequent visits to treat obesity as outlined per CMS and USPSTF guidelines and agreed to schedule her next follow up appointment today.  I, [name], am acting as transcriptionist for [name], MD  I have reviewed the above documentation for accuracy and completeness, and I agree with the above. -[name], MD

## 2018-01-17 NOTE — Progress Notes (Signed)
  Echocardiogram 2D Echocardiogram has been performed.  Johny Chess 01/17/2018, 11:57 AM

## 2018-01-17 NOTE — Patient Instructions (Signed)
Your physician recommends that you schedule a follow-up appointment in: 1 year. Please call in July to make your appointment.

## 2018-01-18 ENCOUNTER — Ambulatory Visit
Admission: RE | Admit: 2018-01-18 | Discharge: 2018-01-18 | Disposition: A | Discharge status: Home / Self Care | Payer: Medicare Other | Source: Ambulatory Visit | Attending: Family Medicine | Admitting: Family Medicine

## 2018-01-18 DIAGNOSIS — M5126 Other intervertebral disc displacement, lumbar region: Secondary | ICD-10-CM | POA: Diagnosis not present

## 2018-01-18 DIAGNOSIS — M48061 Spinal stenosis, lumbar region without neurogenic claudication: Secondary | ICD-10-CM | POA: Diagnosis not present

## 2018-01-18 DIAGNOSIS — M4807 Spinal stenosis, lumbosacral region: Secondary | ICD-10-CM

## 2018-01-18 NOTE — Progress Notes (Signed)
Patient ID: Heather [name], female   DOB: [birth year], 72 y.o.   MRN: [id number] PCP: Dr. Loanne Drilling Cardiology: Dr. Aundra Dubin  72 y.o.with CAD by coronary calcium on CT chest, OSA on Bipap, type II diabetes, and HTN presents for followup of CAD.  Her husband Giada Schoppe) is a patient of mine as well.  She had a workup for exertional dyspnea in 6/14 by Dr Chase Caller.  PFTs were restrictive with normal DLCO, suggesting abnormality was from body habitus.  CT chest showed coronary calcification.  She had an echo showed normal EF and a Lexiscan Cardiolite in 8/14 that was normal.    Echo today showed EF 60-65%, normal RV, PASP 43 mmHg.   She has been doing well recently.  Weight is down 18 lbs.  Still with significant knee pain and low back pain.  Sees a pain management MD.  No chest pain, no exertional dyspnea though not very active.  No lightheadedness.  She cannot take ASA due to hives so is taking Plavix.   ECG (personally reviewed): NSR, normal  Labs (12/15): LDL 64, HDL 50 Labs (6/16): TSH normal, HCT 39.1, K 3.4, creatinine 0.91 Labs (1/18): K 3.5, creatinine 0.87, LDL 78, HDL 65 Labs (10/19): K 3.9, creatinine 0.68, LDL 89, HDL 66, hgb 12.4  PMH: 1. Obese 2. OSA: On oxygen and Bipap at night.  3. Migraines 4. GERD with large hiatal hernia 5. Type II diabetes. 6. PFTs in 6/14 with evidence for restriction but normal DLCO.  Suspect related to body habitus.  7. CAD: Coronary calcium on CT chest in 6/14.  Lexiscan Cardiolite in 8/14 with EF 51%, no ischemia or infarction.  Echo (8/14) with EF 60%, mild LVH.  - Echo (11/19): Ef 60-65%, normal RV, PASP 43 mmHg.  8. Hyperlipidemia 9. HTN 10. Uterine fibroids 11. Osteoarthritis 12. Aspirin allergy: Hives.   SH: Married, quit smoking in 1980s, 2 children  FH: No premature CAD.  ROS: All systems reviewed and negative except as per HPI.   Current Outpatient Medications  Medication Sig Dispense Refill  . acetaminophen (TYLENOL) 500 MG  tablet Take 650 mg by mouth 3 (three) times daily as needed for pain.     [name] ALLEGRA-D ALLERGY & CONGESTION 180-240 MG 24 hr tablet TAKE 1 TABLET BY MOUTH DAILY 30 tablet 1  . Calcium Carbonate-Vitamin D (CALCIUM 600 + D PO) Take 2 tablets by mouth daily.      . clopidogrel (PLAVIX) 75 MG tablet Take 1 tablet (75 mg total) by mouth daily. 90 tablet 3  . Ferrous Sulfate (IRON) 325 (65 FE) MG TABS Take 1 tablet by mouth daily.      . furosemide (LASIX) 40 MG tablet Take 1 tablet (40 mg total) by mouth daily. 90 tablet 3  . gabapentin (NEURONTIN) 100 MG capsule TAKE 2 CAPSULES(200 MG) BY MOUTH AT BEDTIME 180 capsule 1  . glucosamine-chondroitin 500-400 MG tablet Take 1 tablet by mouth 2 (two) times daily.      [name] losartan (COZAAR) 25 MG tablet TAKE 1 TABLET EVERY DAY    . metFORMIN (GLUCOPHAGE-XR) 500 MG 24 hr tablet Take 2 tablets (1,000 mg total) by mouth at bedtime. 180 tablet 3  . Multiple Vitamin (MULTIVITAMIN) tablet Take 1 tablet by mouth daily.      . naproxen sodium (ANAPROX) 220 MG tablet Take 220 mg by mouth as needed.    . Omega-3 Fatty Acids (FISH OIL) 1000 MG CAPS Take 1 capsule by mouth daily.    [name]  omeprazole (PRILOSEC) 20 MG capsule Take 20 mg by mouth daily as needed.     . potassium chloride SA (KLOR-CON M20) 20 MEQ tablet Take 1 tablet (20 mEq total) by mouth daily. 90 tablet 3  . pseudoephedrine-acetaminophen (TYLENOL SINUS) 30-500 MG TABS tablet Take 1 tablet by mouth as needed.    . pyridOXINE (VITAMIN B-6) 100 MG tablet Take 200 mg by mouth daily.    . rosuvastatin (CRESTOR) 40 MG tablet Take 1 tablet (40 mg total) by mouth daily. At night 90 tablet 3  . SF 5000 PLUS 1.1 % CREA dental cream   0  . SUMAtriptan (IMITREX) 20 MG/ACT nasal spray sumatriptan 20 mg/actuation nasal spray    . topiramate (TOPAMAX) 100 MG tablet TAKE 1 TABLET EVERY DAY 90 tablet 3  . Turmeric 500 MG TABS Take 2 tablets by mouth daily.    [name] venlafaxine XR (EFFEXOR-XR) 37.5 MG 24 hr capsule Take 1 capsule  (37.5 mg total) by mouth daily with breakfast. 90 capsule 1  . vitamin E 200 UNIT capsule Take 200 Units by mouth daily.    [name] Zoster Vaccine Live, PF, (ZOSTAVAX) 90122 UNT/0.65ML injection Zostavax (PF) 19,400 unit/0.65 mL subcutaneous suspension    . modafinil (PROVIGIL) 200 MG tablet modafinil 200 mg tablet     Current Facility-Administered Medications  Medication Dose Route Frequency Provider Last Rate Last Dose  . 0.9 %  sodium chloride infusion  500 mL Intravenous Continuous Nandigam, Kavitha V, MD       BP 126/68   Pulse 74   Wt 91.8 kg (202 lb 6.4 oz)   SpO2 94%   BMI 34.74 kg/m  General: NAD Neck: No JVD, no thyromegaly or thyroid nodule.  Lungs: Clear to auscultation bilaterally with normal respiratory effort. CV: Nondisplaced PMI.  Heart regular S1/S2, no S3/S4, no murmur.  No peripheral edema.  No carotid bruit.  Normal pedal pulses.  Abdomen: Soft, nontender, no hepatosplenomegaly, no distention.  Skin: Intact without lesions or rashes.  Neurologic: Alert and oriented x 3.  Psych: Normal affect. Extremities: No clubbing or cyanosis.  HEENT: Normal.   Assessment/Plan: 1. CAD: Patient has coronary disease based on calcium seen on chest CT in the coronaries in 6/14.  She had a Lexiscan Cardiolite in 8/14 that was normal.  Currently, she is really not limited by dyspnea and has no chest pain.  Main problem is bilateral knee pain with ambulation.   - Continue Plavix 75 daily (ASA allergy).  - Continue statin and ARB.  2. Hyperlipidemia: Good lipids in 10/19 on Crestor.  3. HTN: BP is controlled.  4. Obesity: She is steadily losing weight.    Followup 1 year.   [name] 01/18/2018

## 2018-01-19 ENCOUNTER — Ambulatory Visit: Payer: Medicare Other | Admitting: Family Medicine

## 2018-01-20 ENCOUNTER — Encounter: Payer: Self-pay | Admitting: Family Medicine

## 2018-01-20 ENCOUNTER — Encounter: Payer: Self-pay | Admitting: Internal Medicine

## 2018-01-20 ENCOUNTER — Other Ambulatory Visit: Payer: Self-pay

## 2018-01-20 DIAGNOSIS — M5416 Radiculopathy, lumbar region: Secondary | ICD-10-CM

## 2018-01-20 NOTE — Telephone Encounter (Signed)
Spoke with patient. She would like to proceed with epidural. Order placed.

## 2018-01-26 ENCOUNTER — Other Ambulatory Visit: Payer: Self-pay | Admitting: Internal Medicine

## 2018-01-26 DIAGNOSIS — K449 Diaphragmatic hernia without obstruction or gangrene: Secondary | ICD-10-CM

## 2018-01-27 ENCOUNTER — Encounter: Payer: Self-pay | Admitting: Internal Medicine

## 2018-02-03 ENCOUNTER — Telehealth (HOSPITAL_COMMUNITY): Payer: Self-pay

## 2018-02-03 ENCOUNTER — Ambulatory Visit
Admission: RE | Admit: 2018-02-03 | Discharge: 2018-02-03 | Disposition: A | Discharge status: Home / Self Care | Payer: Medicare Other | Source: Ambulatory Visit | Attending: Family Medicine | Admitting: Family Medicine

## 2018-02-03 ENCOUNTER — Other Ambulatory Visit: Payer: Self-pay | Admitting: Family Medicine

## 2018-02-03 DIAGNOSIS — M5416 Radiculopathy, lumbar region: Secondary | ICD-10-CM

## 2018-02-03 DIAGNOSIS — M545 Low back pain: Secondary | ICD-10-CM | POA: Diagnosis not present

## 2018-02-03 MED ORDER — IOPAMIDOL (ISOVUE-M 200) INJECTION 41%
1.0000 mL | Freq: Once | INTRAMUSCULAR | Status: AC
  Administered 2018-02-03: 1 mL via EPIDURAL

## 2018-02-03 MED ORDER — METHYLPREDNISOLONE ACETATE 40 MG/ML INJ SUSP (RADIOLOG
120.0000 mg | Freq: Once | INTRAMUSCULAR | Status: AC
  Administered 2018-02-03: 120 mg via EPIDURAL

## 2018-02-03 NOTE — Telephone Encounter (Signed)
Surgical clearance faxed to [facility] 07/11/17 [phone]

## 2018-02-03 NOTE — Discharge Instructions (Signed)

## 2018-02-10 ENCOUNTER — Ambulatory Visit (INDEPENDENT_AMBULATORY_CARE_PROVIDER_SITE_OTHER): Payer: Medicare Other | Admitting: Family Medicine

## 2018-02-10 ENCOUNTER — Encounter (INDEPENDENT_AMBULATORY_CARE_PROVIDER_SITE_OTHER): Payer: Self-pay | Admitting: Family Medicine

## 2018-02-10 VITALS — BP 106/67 | HR 77 | Ht 65.0 in | Wt 197.0 lb

## 2018-02-10 DIAGNOSIS — Z6832 Body mass index (BMI) 32.0-32.9, adult: Secondary | ICD-10-CM | POA: Diagnosis not present

## 2018-02-10 DIAGNOSIS — E669 Obesity, unspecified: Secondary | ICD-10-CM | POA: Diagnosis not present

## 2018-02-10 DIAGNOSIS — E7849 Other hyperlipidemia: Secondary | ICD-10-CM | POA: Diagnosis not present

## 2018-02-10 NOTE — Progress Notes (Signed)
Office: [phone]  /  Fax: [phone]   HPI:   Chief Complaint: OBESITY Heather [name] is here to discuss her progress with her obesity treatment plan. She is on the lower carbohydrate, vegetable and lean protein rich diet plan and is following her eating plan approximately 50 % of the time. She states she is exercising 0 minutes 0 times per week. Heather [name] has done well with maintaining weight even over Thanksgiving. She has had some dental issues and back problems with has not been able to meal plan and prepping. Her anxiety has been elevated and she notes increased emotional eating.  Her weight is 197 lb (89.4 kg) today and has had a weight loss of 2 pounds over a period of 4 weeks since her last visit. She has lost 71 lbs since starting treatment with [name].  Hyperlipidemia Heather [name] has hyperlipidemia and has been trying to improve her cholesterol levels with intensive lifestyle modification including a low saturated fat diet, exercise and weight loss. She is doing well on her diet and choosing low cholesterol foods. She is stable on Crestor 27m and denies any chest pain or myalgias.  ALLERGIES: Allergies  Allergen Reactions  . Aspirin   . Coconut Oil   . Lisinopril     REACTION: cough  . Metoprolol Itching  . Peanut-Containing Drug Products   . Penicillins     REACTION: rash  . Prednisone   . Strawberry Extract     MEDICATIONS: Current Outpatient Medications on File Prior to Visit  Medication Sig Dispense Refill  . acetaminophen (TYLENOL) 500 MG tablet Take 650 mg by mouth 3 (three) times daily as needed for pain.     .Marland KitchenALLEGRA-D ALLERGY & CONGESTION 180-240 MG 24 hr tablet TAKE 1 TABLET BY MOUTH DAILY 30 tablet 1  . Calcium Carbonate-Vitamin D (CALCIUM 600 + D PO) Take 2 tablets by mouth daily.      . clopidogrel (PLAVIX) 75 MG tablet Take 1 tablet (75 mg total) by mouth daily. 90 tablet 3  . Ferrous Sulfate (IRON) 325 (65 FE) MG TABS Take 1 tablet by mouth daily.      . furosemide  (LASIX) 40 MG tablet Take 1 tablet (40 mg total) by mouth daily. 90 tablet 3  . gabapentin (NEURONTIN) 100 MG capsule TAKE 2 CAPSULES(200 MG) BY MOUTH AT BEDTIME 180 capsule 1  . glucosamine-chondroitin 500-400 MG tablet Take 1 tablet by mouth 2 (two) times daily.      .Marland Kitchenlosartan (COZAAR) 25 MG tablet TAKE 1 TABLET EVERY DAY    . metFORMIN (GLUCOPHAGE-XR) 500 MG 24 hr tablet Take 2 tablets (1,000 mg total) by mouth at bedtime. 180 tablet 3  . modafinil (PROVIGIL) 200 MG tablet modafinil 200 mg tablet    . Multiple Vitamin (MULTIVITAMIN) tablet Take 1 tablet by mouth daily.      . naproxen sodium (ANAPROX) 220 MG tablet Take 220 mg by mouth as needed.    . Omega-3 Fatty Acids (FISH OIL) 1000 MG CAPS Take 1 capsule by mouth daily.    .Marland Kitchenomeprazole (PRILOSEC) 20 MG capsule Take 20 mg by mouth daily as needed.     . potassium chloride SA (KLOR-CON M20) 20 MEQ tablet Take 1 tablet (20 mEq total) by mouth daily. 90 tablet 3  . pseudoephedrine-acetaminophen (TYLENOL SINUS) 30-500 MG TABS tablet Take 1 tablet by mouth as needed.    . pyridOXINE (VITAMIN B-6) 100 MG tablet Take 200 mg by mouth daily.    . rosuvastatin (  CRESTOR) 40 MG tablet Take 1 tablet (40 mg total) by mouth daily. At night 90 tablet 3  . SF 5000 PLUS 1.1 % CREA dental cream   0  . SUMAtriptan (IMITREX) 20 MG/ACT nasal spray sumatriptan 20 mg/actuation nasal spray    . topiramate (TOPAMAX) 100 MG tablet TAKE 1 TABLET EVERY DAY 90 tablet 3  . Turmeric 500 MG TABS Take 2 tablets by mouth daily.    [name] venlafaxine XR (EFFEXOR-XR) 37.5 MG 24 hr capsule Take 1 capsule (37.5 mg total) by mouth daily with breakfast. 90 capsule 1  . vitamin E 200 UNIT capsule Take 200 Units by mouth daily.    [name] Zoster Vaccine Live, PF, (ZOSTAVAX) 00938 UNT/0.65ML injection Zostavax (PF) 19,400 unit/0.65 mL subcutaneous suspension     Current Facility-Administered Medications on File Prior to Visit  Medication Dose Route Frequency Provider Last Rate Last Dose    . 0.9 %  sodium chloride infusion  500 mL Intravenous Continuous Nandigam, Venia Minks, MD        PAST MEDICAL HISTORY: Past Medical History:  Diagnosis Date  . Abdominal hernia   . Abnormal Pap smear   . ALLERGIC RHINITIS 10/22/2006  . Allergy   . ANEMIA 12/18/2008  . Arthritis    scoliosis moderate deg changes lumbar Xray 11/04/17   . ASYMPTOMATIC POSTMENOPAUSAL STATUS 11/22/2007  . Back pain   . CAD (coronary artery disease)    in LAD  . Complication of anesthesia   . Degenerative arthritis   . DIABETES MELLITUS, TYPE II 01/13/2007  . Dyslipidemia   . Fibroid   . Frequent headaches   . GERD 07/20/2007  . GOITER, MULTINODULAR 07/20/2007  . Headache(784.0) 07/20/2007  . HEARING LOSS 11/22/2007  . Hiatal hernia   . Hiatal hernia    large  . History of chicken pox   . Hx of colposcopy with cervical biopsy   . HYPERCHOLESTEROLEMIA 01/13/2007  . Hyperglycemia   . HYPERTENSION 10/22/2006  . Lung nodule    unchanged since 05/26/13   . Migraines   . NASH (nonalcoholic steatohepatitis)   . Nocturnal hypoxemia 02/17/2013  . Obesity   . Obstructive sleep apnea   . OSA on CPAP    per neurology  . OSTEOARTHRITIS 10/22/2006  . Other chronic nonalcoholic liver disease 1/82/9937  . Sedimentation rate elevation   . Sleep apnea    on cpap  . Urine incontinence   . UTI (urinary tract infection)     PAST SURGICAL HISTORY: Past Surgical History:  Procedure Laterality Date  . BREAST SURGERY  1984   Breast reduction b/l   . CATARACT EXTRACTION, BILATERAL    . DEXA  08/2005  . DILATION AND CURETTAGE OF UTERUS    . ELECTROCARDIOGRAM  10/15/2006  . ESOPHAGOGASTRODUODENOSCOPY  12/08/2005  . FOOT SURGERY     hammertoe and bunion 05/2017   . Stress Cardiolite  10/21/2005  . sweat gland removal    . WISDOM TOOTH EXTRACTION      SOCIAL HISTORY: Social History   Tobacco Use  . Smoking status: Former Smoker    Packs/day: 2.00    Years: 20.00    Pack years: 40.00    Types: Cigarettes     Last attempt to quit: 03/03/1979    Years since quitting: 38.9  . Smokeless tobacco: Never Used  Substance Use Topics  . Alcohol use: Yes    Comment: rare  . Drug use: No    FAMILY HISTORY: Family History  Problem  Relation Age of Onset  . Asthma Mother   . Depression Mother   . Bipolar disorder Mother   . Dementia Mother   . Arthritis Mother   . Breast cancer Mother        primary  . Colon cancer Mother        mets from breast  . Cancer Mother        colon  . Hypertension Father   . Migraines Father   . Cancer Maternal Aunt        ?  [name] Heart disease Maternal Grandmother   . Cancer Maternal Grandfather        stomach ?    ROS: Review of Systems  Constitutional: Positive for weight loss.  Cardiovascular: Negative for chest pain.  Musculoskeletal: Negative for myalgias.  Psychiatric/Behavioral: The patient is nervous/anxious.     PHYSICAL EXAM: Blood pressure 106/67, pulse 77, height 5' 5"  (1.651 m), weight 197 lb (89.4 kg), SpO2 96 %. Body mass index is 32.78 kg/m. Physical Exam Vitals signs reviewed.  Constitutional:      Appearance: Normal appearance. She is obese.  Cardiovascular:     Rate and Rhythm: Normal rate.  Pulmonary:     Effort: Pulmonary effort is normal.  Musculoskeletal: Normal range of motion.  Skin:    General: Skin is warm and dry.  Neurological:     Mental Status: She is alert and oriented to person, place, and time.  Psychiatric:        Mood and Affect: Mood normal.        Behavior: Behavior normal.     RECENT LABS AND TESTS: BMET    Component Value Date/Time   NA 142 12/01/2017 1146   K 3.9 12/01/2017 1146   CL 103 12/01/2017 1146   CO2 27 12/01/2017 1146   GLUCOSE 83 12/01/2017 1146   GLUCOSE 116 (H) 03/05/2016 0958   BUN 10 12/01/2017 1146   CREATININE 0.68 12/01/2017 1146   CREATININE 0.81 12/28/2011 0853   CALCIUM 9.2 12/01/2017 1146   GFRNONAA 88 12/01/2017 1146   GFRAA 102 12/01/2017 1146   Lab Results   Component Value Date   HGBA1C 6.0 (H) 12/01/2017   HGBA1C 5.7 (H) 07/29/2017   HGBA1C 5.9 03/15/2017   HGBA1C 6.3 (H) 12/23/2016   HGBA1C 6.3 09/08/2016   Lab Results  Component Value Date   INSULIN 5.6 12/01/2017   INSULIN 6.8 07/29/2017   INSULIN 20.9 12/23/2016   CBC    Component Value Date/Time   WBC 4.0 12/01/2017 1146   WBC 7.2 03/05/2016 0958   RBC 4.35 12/01/2017 1146   RBC 4.46 03/05/2016 0958   HGB 12.4 12/01/2017 1146   HCT 39.5 12/01/2017 1146   PLT 300.0 03/05/2016 0958   MCV 91 12/01/2017 1146   MCH 28.5 12/01/2017 1146   MCH 29.4 12/28/2011 0853   MCHC 31.4 (L) 12/01/2017 1146   MCHC 32.9 03/05/2016 0958   RDW 16.2 (H) 12/01/2017 1146   LYMPHSABS 1.2 12/01/2017 1146   MONOABS 0.4 03/05/2016 0958   EOSABS 0.1 12/01/2017 1146   BASOSABS 0.0 12/01/2017 1146   Iron/TIBC/Ferritin/ %Sat    Component Value Date/Time   IRON 38 12/23/2016 0956   TIBC 271 12/23/2016 0956   FERRITIN 40 12/23/2016 0956   IRONPCTSAT 14 (L) 12/23/2016 0956   Lipid Panel     Component Value Date/Time   CHOL 178 12/01/2017 1146   TRIG 66 12/01/2017 1146   HDL 76 12/01/2017 1146  CHOLHDL 3 03/05/2016 0958   VLDL 26.0 03/05/2016 0958   LDLCALC 89 12/01/2017 1146   Hepatic Function Panel     Component Value Date/Time   PROT 7.0 12/01/2017 1146   ALBUMIN 4.1 12/01/2017 1146   AST 20 12/01/2017 1146   ALT 20 12/01/2017 1146   ALKPHOS 78 12/01/2017 1146   BILITOT <0.2 12/01/2017 1146   BILIDIR 0.1 02/15/2015 1010   IBILI 0.2 12/28/2011 0853      Component Value Date/Time   TSH 1.170 12/23/2016 0956   TSH 1.76 03/05/2016 0958   TSH 1.86 02/15/2015 1010   Results for KIMIYAH, BLICK (MRN [id number]) as of 02/10/2018 13:18  Ref. Range 12/01/2017 11:46  Vitamin D, 25-Hydroxy Latest Ref Range: 30.0 - 100.0 ng/mL 34.2   ASSESSMENT AND PLAN: Other hyperlipidemia  Class 1 obesity with serious comorbidity and body mass index (BMI) of 32.0 to 32.9 in adult, unspecified  obesity type  PLAN:  Hyperlipidemia Heather [name] was informed of the American Heart Association Guidelines emphasizing intensive lifestyle modifications as the first line treatment for hyperlipidemia. We discussed many lifestyle modifications today in depth, and Heather [name] will continue to work on decreasing saturated fats such as fatty red meat, butter and many fried foods. She will also increase vegetables and lean protein in her diet and continue to work on exercise and weight loss efforts. Heather [name] agrees to continue taking Crestor and her diet. We will check labs in 2 months and she agrees to follow up as directed.  I spent > than 50% of the 25 minute visit on counseling as documented in the note.  Obesity Heather [name] is currently in the action stage of change. As such, her goal is to continue with weight loss efforts. She has agreed to follow a lower carbohydrate, vegetable and lean protein rich diet plan. Heather [name] has been instructed to work up to a goal of 150 minutes of combined cardio and strengthening exercise per week for weight loss and overall health benefits. We discussed the following Behavioral Modification Strategies today: increasing lean protein intake, decreasing simple carbohydrates, work on meal planning and easy cooking plans, holiday eating strategies, and celebration eating strategies.  Heather [name] has agreed to follow up with our clinic in 4 weeks. She was informed of the importance of frequent follow up visits to maximize her success with intensive lifestyle modifications for her multiple health conditions.   OBESITY BEHAVIORAL INTERVENTION VISIT  Today's visit was # 22   Starting weight: 268 lbs Starting date: 12/23/16 Today's weight : Weight: 197 lb (89.4 kg)  Today's date: 02/10/2018 Total lbs lost to date: 58  ASK: We discussed the diagnosis of obesity with Heather [name] today and Heather [name] agreed to give [name] permission to discuss obesity behavioral modification therapy  today.  ASSESS: Heather [name] has the diagnosis of obesity and her BMI today is 32.7. Heather [name] is in the action stage of change.   ADVISE: Heather [name] was educated on the multiple health risks of obesity as well as the benefit of weight loss to improve her health. She was advised of the need for long term treatment and the importance of lifestyle modifications to improve her current health and to decrease her risk of future health problems.  AGREE: Multiple dietary modification options and treatment options were discussed and Heather [name] agreed to follow the recommendations documented in the above note.  ARRANGE: Heather [name] was educated on the importance of frequent visits to treat obesity as outlined per CMS and USPSTF guidelines and agreed to schedule  her next follow up appointment today.  I, Marcille Blanco, am acting as transcriptionist for Starlyn Skeans, MD  I have reviewed the above documentation for accuracy and completeness, and I agree with the above. -[name], MD

## 2018-02-16 ENCOUNTER — Ambulatory Visit (INDEPENDENT_AMBULATORY_CARE_PROVIDER_SITE_OTHER): Payer: Medicare Other | Admitting: Family Medicine

## 2018-02-16 DIAGNOSIS — I2510 Atherosclerotic heart disease of native coronary artery without angina pectoris: Secondary | ICD-10-CM

## 2018-02-16 DIAGNOSIS — M170 Bilateral primary osteoarthritis of knee: Secondary | ICD-10-CM | POA: Diagnosis not present

## 2018-02-16 MED ORDER — VENLAFAXINE HCL ER 37.5 MG PO CP24: 37.5000 mg | ORAL_CAPSULE | Freq: Every day | ORAL | 1 refills | Status: DC

## 2018-02-16 MED ORDER — GABAPENTIN 100 MG PO CAPS: 200.0000 mg | ORAL_CAPSULE | Freq: Every day | ORAL | 3 refills | Status: DC

## 2018-02-16 NOTE — Progress Notes (Signed)
[name] [address] [place], [place] [address] Phone: [phone] Subjective:   [name], am serving as a scribe for Dr. Hulan Saas.    CC: Bilateral knee pain  [id number]  Heather [name] is a 72 y.o. female coming in with complaint of bilateral knee pain. Pain has begun to increase in the past couple weeks. Right worse than left. Had epidural, on 12/5. Has had relief but within the past couple of days she is having increasing back pain.  Patient was given the epidural from the spinal stenosis.  Does state that overall though she would still states that she is better than where she started before the last epidural.  Patient is trying to be more active but is finding it difficult.     Past Medical History:  Diagnosis Date  . Abdominal hernia   . Abnormal Pap smear   . ALLERGIC RHINITIS 10/22/2006  . Allergy   . ANEMIA 12/18/2008  . Arthritis    scoliosis moderate deg changes lumbar Xray 11/04/17   . ASYMPTOMATIC POSTMENOPAUSAL STATUS 11/22/2007  . Back pain   . CAD (coronary artery disease)    in LAD  . Complication of anesthesia   . Degenerative arthritis   . DIABETES MELLITUS, TYPE II 01/13/2007  . Dyslipidemia   . Fibroid   . Frequent headaches   . GERD 07/20/2007  . GOITER, MULTINODULAR 07/20/2007  . Headache(784.0) 07/20/2007  . HEARING LOSS 11/22/2007  . Hiatal hernia   . Hiatal hernia    large  . History of chicken pox   . Hx of colposcopy with cervical biopsy   . HYPERCHOLESTEROLEMIA 01/13/2007  . Hyperglycemia   . HYPERTENSION 10/22/2006  . Lung nodule    unchanged since 05/26/13   . Migraines   . NASH (nonalcoholic steatohepatitis)   . Nocturnal hypoxemia 02/17/2013  . Obesity   . Obstructive sleep apnea   . OSA on CPAP    per neurology  . OSTEOARTHRITIS 10/22/2006  . Other chronic nonalcoholic liver disease 06/09/7351  . Sedimentation rate elevation   . Sleep apnea    on cpap  . Urine incontinence   . UTI  (urinary tract infection)    Past Surgical History:  Procedure Laterality Date  . BREAST SURGERY  1984   Breast reduction b/l   . CATARACT EXTRACTION, BILATERAL    . DEXA  08/2005  . DILATION AND CURETTAGE OF UTERUS    . ELECTROCARDIOGRAM  10/15/2006  . ESOPHAGOGASTRODUODENOSCOPY  12/08/2005  . FOOT SURGERY     hammertoe and bunion 05/2017   . Stress Cardiolite  10/21/2005  . sweat gland removal    . WISDOM TOOTH EXTRACTION     Social History   Socioeconomic History  . Marital status: Married    Spouse name: [name]  . Number of children: 2  . Years of education: College  . Highest education level: Not on file  Occupational History  . Occupation: Retired  Scientific laboratory technician  . Financial resource strain: Not on file  . Food insecurity:    Worry: Not on file    Inability: Not on file  . Transportation needs:    Medical: Not on file    Non-medical: Not on file  Tobacco Use  . Smoking status: Former Smoker    Packs/day: 2.00    Years: 20.00    Pack years: 40.00    Types: Cigarettes    Last attempt to quit: 03/03/1979  Years since quitting: 38.9  . Smokeless tobacco: Never Used  Substance and Sexual Activity  . Alcohol use: Yes    Comment: rare  . Drug use: No  . Sexual activity: Yes    Birth control/protection: Surgical  Lifestyle  . Physical activity:    Days per week: Not on file    Minutes per session: Not on file  . Stress: Not on file  Relationships  . Social connections:    Talks on phone: Not on file    Gets together: Not on file    Attends religious service: Not on file    Active member of club or organization: Not on file    Attends meetings of clubs or organizations: Not on file    Relationship status: Not on file  Other Topics Concern  . Not on file  Social History Narrative   Patient is married [name]).   Patient has two children; 3 pregnancies 2 live births    Patient is retired Designer, jewellery, Chiropodist     Patient has a Financial risk analyst.   Patient is right-handed.   Patient lives at home with family.   Caffeine Use: 2 soda every other day   Former smoker 20+ years ago 2 ppd quit in 50s   No guns, wears seat belt, safe in relationship    Allergies  Allergen Reactions  . Aspirin   . Coconut Oil   . Lisinopril     REACTION: cough  . Metoprolol Itching  . Peanut-Containing Drug Products   . Penicillins     REACTION: rash  . Prednisone   . Strawberry Extract    Family History  Problem Relation Age of Onset  . Asthma Mother   . Depression Mother   . Bipolar disorder Mother   . Dementia Mother   . Arthritis Mother   . Breast cancer Mother        primary  . Colon cancer Mother        mets from breast  . Cancer Mother        colon  . Hypertension Father   . Migraines Father   . Cancer Maternal Aunt        ?  [name] Heart disease Maternal Grandmother   . Cancer Maternal Grandfather        stomach ?    Current Outpatient Medications (Endocrine & Metabolic):  .  metFORMIN (GLUCOPHAGE-XR) 500 MG 24 hr tablet, Take 2 tablets (1,000 mg total) by mouth at bedtime.   Current Outpatient Medications (Cardiovascular):  .  furosemide (LASIX) 40 MG tablet, Take 1 tablet (40 mg total) by mouth daily. [name]  losartan (COZAAR) 25 MG tablet, TAKE 1 TABLET EVERY DAY .  rosuvastatin (CRESTOR) 40 MG tablet, Take 1 tablet (40 mg total) by mouth daily. At night   Current Outpatient Medications (Respiratory):  [name]  ALLEGRA-D ALLERGY & CONGESTION 180-240 MG 24 hr tablet, TAKE 1 TABLET BY MOUTH DAILY .  pseudoephedrine-acetaminophen (TYLENOL SINUS) 30-500 MG TABS tablet, Take 1 tablet by mouth as needed.   Current Outpatient Medications (Analgesics):  .  acetaminophen (TYLENOL) 500 MG tablet, Take 650 mg by mouth 3 (three) times daily as needed for pain.  .  naproxen sodium (ANAPROX) 220 MG tablet, Take 220 mg by mouth as needed. .  SUMAtriptan (IMITREX) 20 MG/ACT nasal spray, sumatriptan 20  mg/actuation nasal spray   Current Outpatient Medications (Hematological):  .  clopidogrel (PLAVIX) 75 MG tablet, Take 1  tablet (75 mg total) by mouth daily. .  Ferrous Sulfate (IRON) 325 (65 FE) MG TABS, Take 1 tablet by mouth daily.     Current Outpatient Medications (Other):  [name]  Calcium Carbonate-Vitamin D (CALCIUM 600 + D PO), Take 2 tablets by mouth daily.   [name]  glucosamine-chondroitin 500-400 MG tablet, Take 1 tablet by mouth 2 (two) times daily.   .  modafinil (PROVIGIL) 200 MG tablet, modafinil 200 mg tablet .  Multiple Vitamin (MULTIVITAMIN) tablet, Take 1 tablet by mouth daily.   .  Omega-3 Fatty Acids (FISH OIL) 1000 MG CAPS, Take 1 capsule by mouth daily. [name]  omeprazole (PRILOSEC) 20 MG capsule, Take 20 mg by mouth daily as needed.  .  potassium chloride SA (KLOR-CON M20) 20 MEQ tablet, Take 1 tablet (20 mEq total) by mouth daily. [name]  pyridOXINE (VITAMIN B-6) 100 MG tablet, Take 200 mg by mouth daily. .  SF 5000 PLUS 1.1 % CREA dental cream,  .  topiramate (TOPAMAX) 100 MG tablet, TAKE 1 TABLET EVERY DAY .  Turmeric 500 MG TABS, Take 2 tablets by mouth daily. .  vitamin E 200 UNIT capsule, Take 200 Units by mouth daily. [name]  Zoster Vaccine Live, PF, (ZOSTAVAX) 99242 UNT/0.65ML injection, Zostavax (PF) 19,400 unit/0.65 mL subcutaneous suspension .  gabapentin (NEURONTIN) 100 MG capsule, Take 2 capsules (200 mg total) by mouth at bedtime. [name]  venlafaxine XR (EFFEXOR XR) 37.5 MG 24 hr capsule, Take 1 capsule (37.5 mg total) by mouth daily with breakfast.  Current Facility-Administered Medications (Other):  .  0.9 %  sodium chloride infusion    Past medical history, social, surgical and family history all reviewed in electronic medical record.  No pertanent information unless stated regarding to the chief complaint.   Review of Systems:  No headache, visual changes, nausea, vomiting, diarrhea, constipation, dizziness, abdominal pain, skin rash, fevers, chills, night sweats, weight  loss, swollen lymph nodes, , chest pain, shortness of breath, mood changes.  Positive muscle aches and body aches  Objective  Blood pressure 110/70, pulse 72, height 5' 5"  (1.651 m), weight 201 lb (91.2 kg), SpO2 98 %.     General: No apparent distress alert and oriented x3 mood and affect normal, dressed appropriately.  HEENT: Pupils equal, extraocular movements intact  Respiratory: Patient's speak in full sentences and does not appear short of breath  Cardiovascular: No lower extremity edema, non tender, no erythema  Skin: Warm dry intact with no signs of infection or rash on extremities or on axial skeleton.  Abdomen: Soft nontender  Neuro: Cranial nerves II through XII are intact, neurovascularly intact in all extremities with 2+ DTRs and 2+ pulses.  Lymph: No lymphadenopathy of posterior or anterior cervical chain or axillae bilaterally.  Gait antalgic gait MSK:  tender with full range of motion and good stability and symmetric strength and tone of shoulders, elbows, wrist, hip, and ankles bilaterally.  Knee: Bilateral valgus deformity noted.  Abnormal thigh to calf ratio.  Tender to palpation over medial and PF joint line.  ROM full in flexion and extension and lower leg rotation. instability with valgus force.  painful patellar compression. Patellar glide with moderate crepitus. Patellar and quadriceps tendons unremarkable. Hamstring and quadriceps strength is normal.  After informed written and verbal consent, patient was seated on exam table. Right knee was prepped with alcohol swab and utilizing anterolateral approach, patient's right knee space was injected with 4:1  marcaine 0.5%: Kenalog 53m/dL. Patient tolerated the procedure well without  immediate complications.  After informed written and verbal consent, patient was seated on exam table. Left knee was prepped with alcohol swab and utilizing anterolateral approach, patient's left knee space was injected with 4:1  marcaine  0.5%: Kenalog 73m/dL. Patient tolerated the procedure well without immediate complications.   Impression and Recommendations:     This case required medical decision making of moderate complexity. The above documentation has been reviewed and is accurate and complete ZLyndal Pulley DO       Note: This dictation was prepared with Dragon dictation along with smaller phrase technology. Any transcriptional errors that result from this process are unintentional.

## 2018-02-16 NOTE — Patient Instructions (Signed)
Good to see you  Gabapentin 100-24m at night Start the effexor again I thin kit can help  Stay active I hope the knee injections help  Happy holidays!  See me again in 5-6 weeks

## 2018-02-16 NOTE — Assessment & Plan Note (Signed)
Patient given injection.  Encourage patient to continue with weight loss.  We discussed topical anti-inflammatories, over-the-counter medications, we discussed possibility of surgical intervention.  Patient still could do another round of Visco supplementation if possible.  Follow-up again in 4 to 8 weeks.  Can repeat steroid injections every 12 weeks if needed

## 2018-03-14 ENCOUNTER — Ambulatory Visit (INDEPENDENT_AMBULATORY_CARE_PROVIDER_SITE_OTHER): Payer: Medicare Other | Admitting: Family Medicine

## 2018-03-14 ENCOUNTER — Encounter (INDEPENDENT_AMBULATORY_CARE_PROVIDER_SITE_OTHER): Payer: Self-pay | Admitting: Family Medicine

## 2018-03-14 VITALS — BP 102/64 | HR 65 | Temp 97.8°F | Ht 65.0 in | Wt 198.0 lb

## 2018-03-14 DIAGNOSIS — F3289 Other specified depressive episodes: Secondary | ICD-10-CM | POA: Diagnosis not present

## 2018-03-14 DIAGNOSIS — Z6833 Body mass index (BMI) 33.0-33.9, adult: Secondary | ICD-10-CM

## 2018-03-14 DIAGNOSIS — E669 Obesity, unspecified: Secondary | ICD-10-CM

## 2018-03-15 NOTE — Progress Notes (Signed)
Office: [phone]  /  Fax: [phone]   HPI:   Chief Complaint: OBESITY Heather [name] is here to discuss her progress with her obesity treatment plan. She is on the lower carbohydrate, vegetable and lean protein rich diet plan and is following her eating plan approximately 99 % of the time. She states she is exercising on the elliptical 30 minutes 1 to 2 times per week. Heather [name] had increased some celebration eating over the holidays. She had gained some weight, but she has already gotten back on track and has lost most of her weight. Her weight is 198 lb (89.8 kg) today and has had a weight gain of 1 pound over a period of 4 weeks since her last visit. She has lost 70 lbs since starting treatment with [name].  Depression with emotional eating behaviors Heather [name] struggles with emotional eating and using food for comfort to the extent that it is negatively impacting her health. She often snacks when she is not hungry. Heather [name] sometimes feels she is out of control and then feels guilty that she made poor food choices. Her mood is stable and she is doing well on Topamax and Effexor. She is on Topamax and Effexor to help decrease emotional eating. Heather [name] is sleeping well and she did not find it difficult to get back on her eating plan after the holidays. She has been working on behavior modification techniques to help reduce her emotional eating and has been somewhat successful. She shows no sign of suicidal or homicidal ideations.  Depression screen [name] 2/[facility] 01/05/2018 12/23/2016  Decreased Interest 0 2  Down, Depressed, Hopeless 0 1  PHQ - 2 Score 0 3  Altered sleeping - 1  Tired, decreased energy - 3  Change in appetite - 1  Feeling bad or failure about yourself  - 0  Trouble concentrating - 0  Moving slowly or fidgety/restless - 2  Suicidal thoughts - 0  PHQ-9 Score - 10  Difficult doing work/chores - Somewhat difficult  Some recent data might be hidden     ASSESSMENT AND PLAN:  Other depression -  with emotional eating  Class 1 obesity with serious comorbidity and body mass index (BMI) of 33.0 to 33.9 in adult, unspecified obesity type  PLAN:  Depression with Emotional Eating Behaviors We discussed behavior modification techniques today to help Heather [name] deal with her emotional eating and depression. She will continue Topamax and Effexor as prescribed and follow up as directed. Support and encouragement was offered to Heather [name] today.   I spent > than 50% of the 15 minute visit on counseling as documented in the note.  Obesity Heather [name] is currently in the action stage of change. As such, her goal is to continue with weight loss efforts She has agreed to continue to follow the lower carbohydrate, vegetable and lean protein rich diet plan Heather [name] has been instructed to work up to a goal of 150 minutes of combined cardio and strengthening exercise per week for weight loss and overall health benefits. We discussed the following Behavioral Modification Strategies today: increasing lean protein intake and decreasing simple carbohydrates   Heather [name] has agreed to follow up with our clinic in 3 weeks. She was informed of the importance of frequent follow up visits to maximize her success with intensive lifestyle modifications for her multiple health conditions.  ALLERGIES: Allergies  Allergen Reactions  . Aspirin   . Coconut Oil   . Lisinopril     REACTION: cough  . Metoprolol Itching  . Peanut-Containing  Drug Products   . Penicillins     REACTION: rash  . Prednisone   . Strawberry Extract     MEDICATIONS: Current Outpatient Medications on File Prior to Visit  Medication Sig Dispense Refill  . acetaminophen (TYLENOL) 500 MG tablet Take 650 mg by mouth 3 (three) times daily as needed for pain.     . Calcium Carbonate-Vitamin D (CALCIUM 600 + D PO) Take 2 tablets by mouth daily.      . clopidogrel (PLAVIX) 75 MG tablet Take 1 tablet (75 mg total) by mouth daily. 90 tablet 3  . Ferrous Sulfate  (IRON) 325 (65 FE) MG TABS Take 1 tablet by mouth daily.      . fexofenadine (ALLEGRA) 180 MG tablet Take 180 mg by mouth daily.    . furosemide (LASIX) 40 MG tablet Take 1 tablet (40 mg total) by mouth daily. 90 tablet 3  . gabapentin (NEURONTIN) 100 MG capsule Take 2 capsules (200 mg total) by mouth at bedtime. 180 capsule 3  . glucosamine-chondroitin 500-400 MG tablet Take 1 tablet by mouth 2 (two) times daily.      [name] losartan (COZAAR) 25 MG tablet TAKE 1 TABLET EVERY DAY    . metFORMIN (GLUCOPHAGE-XR) 500 MG 24 hr tablet Take 2 tablets (1,000 mg total) by mouth at bedtime. 180 tablet 3  . modafinil (PROVIGIL) 200 MG tablet modafinil 200 mg tablet    . Multiple Vitamin (MULTIVITAMIN) tablet Take 1 tablet by mouth daily.      . naproxen sodium (ANAPROX) 220 MG tablet Take 220 mg by mouth as needed.    . Omega-3 Fatty Acids (FISH OIL) 1000 MG CAPS Take 1 capsule by mouth daily.    [name] omeprazole (PRILOSEC) 20 MG capsule Take 20 mg by mouth daily as needed.     . potassium chloride SA (KLOR-CON M20) 20 MEQ tablet Take 1 tablet (20 mEq total) by mouth daily. 90 tablet 3  . pyridOXINE (VITAMIN B-6) 100 MG tablet Take 200 mg by mouth daily.    . rosuvastatin (CRESTOR) 40 MG tablet Take 1 tablet (40 mg total) by mouth daily. At night 90 tablet 3  . SF 5000 PLUS 1.1 % CREA dental cream   0  . SUMAtriptan (IMITREX) 20 MG/ACT nasal spray sumatriptan 20 mg/actuation nasal spray    . topiramate (TOPAMAX) 100 MG tablet TAKE 1 TABLET EVERY DAY 90 tablet 3  . Turmeric 500 MG TABS Take 2 tablets by mouth daily.    [name] venlafaxine XR (EFFEXOR XR) 37.5 MG 24 hr capsule Take 1 capsule (37.5 mg total) by mouth daily with breakfast. 90 capsule 1  . vitamin E 200 UNIT capsule Take 200 Units by mouth daily.    [name] Zoster Vaccine Live, PF, (ZOSTAVAX) 19509 UNT/0.65ML injection Zostavax (PF) 19,400 unit/0.65 mL subcutaneous suspension     Current Facility-Administered Medications on File Prior to Visit  Medication Dose  Route Frequency Provider Last Rate Last Dose  . 0.9 %  sodium chloride infusion  500 mL Intravenous Continuous Nandigam, Venia Minks, MD        PAST MEDICAL HISTORY: Past Medical History:  Diagnosis Date  . Abdominal hernia   . Abnormal Pap smear   . ALLERGIC RHINITIS 10/22/2006  . Allergy   . ANEMIA 12/18/2008  . Arthritis    scoliosis moderate deg changes lumbar Xray 11/04/17   . ASYMPTOMATIC POSTMENOPAUSAL STATUS 11/22/2007  . Back pain   . CAD (coronary artery disease)    in  LAD  . Complication of anesthesia   . Degenerative arthritis   . DIABETES MELLITUS, TYPE II 01/13/2007  . Dyslipidemia   . Fibroid   . Frequent headaches   . GERD 07/20/2007  . GOITER, MULTINODULAR 07/20/2007  . Headache(784.0) 07/20/2007  . HEARING LOSS 11/22/2007  . Hiatal hernia   . Hiatal hernia    large  . History of chicken pox   . Hx of colposcopy with cervical biopsy   . HYPERCHOLESTEROLEMIA 01/13/2007  . Hyperglycemia   . HYPERTENSION 10/22/2006  . Lung nodule    unchanged since 05/26/13   . Migraines   . NASH (nonalcoholic steatohepatitis)   . Nocturnal hypoxemia 02/17/2013  . Obesity   . Obstructive sleep apnea   . OSA on CPAP    per neurology  . OSTEOARTHRITIS 10/22/2006  . Other chronic nonalcoholic liver disease 04/30/6008  . Sedimentation rate elevation   . Sleep apnea    on cpap  . Urine incontinence   . UTI (urinary tract infection)     PAST SURGICAL HISTORY: Past Surgical History:  Procedure Laterality Date  . BREAST SURGERY  1984   Breast reduction b/l   . CATARACT EXTRACTION, BILATERAL    . DEXA  08/2005  . DILATION AND CURETTAGE OF UTERUS    . ELECTROCARDIOGRAM  10/15/2006  . ESOPHAGOGASTRODUODENOSCOPY  12/08/2005  . FOOT SURGERY     hammertoe and bunion 05/2017   . Stress Cardiolite  10/21/2005  . sweat gland removal    . WISDOM TOOTH EXTRACTION      SOCIAL HISTORY: Social History   Tobacco Use  . Smoking status: Former Smoker    Packs/day: 2.00    Years: 20.00     Pack years: 40.00    Types: Cigarettes    Last attempt to quit: 03/03/1979    Years since quitting: 39.0  . Smokeless tobacco: Never Used  Substance Use Topics  . Alcohol use: Yes    Comment: rare  . Drug use: No    FAMILY HISTORY: Family History  Problem Relation Age of Onset  . Asthma Mother   . Depression Mother   . Bipolar disorder Mother   . Dementia Mother   . Arthritis Mother   . Breast cancer Mother        primary  . Colon cancer Mother        mets from breast  . Cancer Mother        colon  . Hypertension Father   . Migraines Father   . Cancer Maternal Aunt        ?  [name] Heart disease Maternal Grandmother   . Cancer Maternal Grandfather        stomach ?    ROS: Review of Systems  Constitutional: Negative for weight loss.  Psychiatric/Behavioral: Positive for depression. Negative for suicidal ideas. The patient does not have insomnia.     PHYSICAL EXAM: Blood pressure 102/64, pulse 65, temperature 97.8 F (36.6 C), temperature source Oral, height 5' 5"  (1.651 m), weight 198 lb (89.8 kg), SpO2 98 %. Body mass index is 32.95 kg/m. Physical Exam Vitals signs reviewed.  Constitutional:      Appearance: Normal appearance. She is well-developed. She is obese.  Cardiovascular:     Rate and Rhythm: Normal rate.  Pulmonary:     Effort: Pulmonary effort is normal.  Musculoskeletal: Normal range of motion.  Skin:    General: Skin is warm and dry.  Neurological:     Mental  Status: She is alert and oriented to person, place, and time.  Psychiatric:        Mood and Affect: Mood normal.        Behavior: Behavior normal.        Thought Content: Thought content does not include homicidal or suicidal ideation.     RECENT LABS AND TESTS: BMET    Component Value Date/Time   NA 142 12/01/2017 1146   K 3.9 12/01/2017 1146   CL 103 12/01/2017 1146   CO2 27 12/01/2017 1146   GLUCOSE 83 12/01/2017 1146   GLUCOSE 116 (H) 03/05/2016 0958   BUN 10 12/01/2017 1146    CREATININE 0.68 12/01/2017 1146   CREATININE 0.81 12/28/2011 0853   CALCIUM 9.2 12/01/2017 1146   GFRNONAA 88 12/01/2017 1146   GFRAA 102 12/01/2017 1146   Lab Results  Component Value Date   HGBA1C 6.0 (H) 12/01/2017   HGBA1C 5.7 (H) 07/29/2017   HGBA1C 5.9 03/15/2017   HGBA1C 6.3 (H) 12/23/2016   HGBA1C 6.3 09/08/2016   Lab Results  Component Value Date   INSULIN 5.6 12/01/2017   INSULIN 6.8 07/29/2017   INSULIN 20.9 12/23/2016   CBC    Component Value Date/Time   WBC 4.0 12/01/2017 1146   WBC 7.2 03/05/2016 0958   RBC 4.35 12/01/2017 1146   RBC 4.46 03/05/2016 0958   HGB 12.4 12/01/2017 1146   HCT 39.5 12/01/2017 1146   PLT 300.0 03/05/2016 0958   MCV 91 12/01/2017 1146   MCH 28.5 12/01/2017 1146   MCH 29.4 12/28/2011 0853   MCHC 31.4 (L) 12/01/2017 1146   MCHC 32.9 03/05/2016 0958   RDW 16.2 (H) 12/01/2017 1146   LYMPHSABS 1.2 12/01/2017 1146   MONOABS 0.4 03/05/2016 0958   EOSABS 0.1 12/01/2017 1146   BASOSABS 0.0 12/01/2017 1146   Iron/TIBC/Ferritin/ %Sat    Component Value Date/Time   IRON 38 12/23/2016 0956   TIBC 271 12/23/2016 0956   FERRITIN 40 12/23/2016 0956   IRONPCTSAT 14 (L) 12/23/2016 0956   Lipid Panel     Component Value Date/Time   CHOL 178 12/01/2017 1146   TRIG 66 12/01/2017 1146   HDL 76 12/01/2017 1146   CHOLHDL 3 03/05/2016 0958   VLDL 26.0 03/05/2016 0958   LDLCALC 89 12/01/2017 1146   Hepatic Function Panel     Component Value Date/Time   PROT 7.0 12/01/2017 1146   ALBUMIN 4.1 12/01/2017 1146   AST 20 12/01/2017 1146   ALT 20 12/01/2017 1146   ALKPHOS 78 12/01/2017 1146   BILITOT <0.2 12/01/2017 1146   BILIDIR 0.1 02/15/2015 1010   IBILI 0.2 12/28/2011 0853      Component Value Date/Time   TSH 1.170 12/23/2016 0956   TSH 1.76 03/05/2016 0958   TSH 1.86 02/15/2015 1010     Ref. Range 12/01/2017 11:46  Vitamin D, 25-Hydroxy Latest Ref Range: 30.0 - 100.0 ng/mL 34.2     OBESITY BEHAVIORAL INTERVENTION  VISIT  Today's visit was # 22  Starting weight: 268 lbs Starting date: 12/23/2016 Today's weight : 198 lbs Today's date: 03/14/2018 Total lbs lost to date: 82   ASK: We discussed the diagnosis of obesity with Heather [name] today and Heather [name] agreed to give [name] permission to discuss obesity behavioral modification therapy today.  ASSESS: Heather [name] has the diagnosis of obesity and her BMI today is 32.95 Heather [name] is in the action stage of change   ADVISE: Heather [name] was educated on the multiple health risks  of obesity as well as the benefit of weight loss to improve her health. She was advised of the need for long term treatment and the importance of lifestyle modifications to improve her current health and to decrease her risk of future health problems.  AGREE: Multiple dietary modification options and treatment options were discussed and  Heather [name] agreed to follow the recommendations documented in the above note.  ARRANGE: Heather [name] was educated on the importance of frequent visits to treat obesity as outlined per CMS and USPSTF guidelines and agreed to schedule her next follow up appointment today.  I, Doreene Nest, am acting as transcriptionist for [name], MD  I have reviewed the above documentation for accuracy and completeness, and I agree with the above. -[name], MD

## 2018-03-22 NOTE — Progress Notes (Signed)
[name] [address] [place], [place] [address] Phone: [phone] Subjective:   Heather [name], am serving as a scribe for Dr. Hulan Saas.   CC: Bilateral knee pain, back pain follow-up  [id number]  Heather [name] is a 73 y.o. female coming in with complaint of bilateral knee pain and lower back pain. Patient had an epidural in December 2019. She states that her pain has diminished since the epidural.  Patient states that the pain is very minimal at the moment.  Patient states that she has noticed more endurance.  Has been able to increase activity slowly.  Happy with the results.  Been able to do the exercises more regularly.  Feels that her weight loss is also been helpful  Also was given bilateral knee injections in December 2019 which helped to alleviate her knee pain. States that her knees still feel weak but she is [name] longer having pain throughout the day.  States also thinks that the weight loss has been helpful      Past Medical History:  Diagnosis Date  . Abdominal hernia   . Abnormal Pap smear   . ALLERGIC RHINITIS 10/22/2006  . Allergy   . ANEMIA 12/18/2008  . Arthritis    scoliosis moderate deg changes lumbar Xray 11/04/17   . ASYMPTOMATIC POSTMENOPAUSAL STATUS 11/22/2007  . Back pain   . CAD (coronary artery disease)    in LAD  . Complication of anesthesia   . Degenerative arthritis   . DIABETES MELLITUS, TYPE II 01/13/2007  . Dyslipidemia   . Fibroid   . Frequent headaches   . GERD 07/20/2007  . GOITER, MULTINODULAR 07/20/2007  . Headache(784.0) 07/20/2007  . HEARING LOSS 11/22/2007  . Hiatal hernia   . Hiatal hernia    large  . History of chicken pox   . Hx of colposcopy with cervical biopsy   . HYPERCHOLESTEROLEMIA 01/13/2007  . Hyperglycemia   . HYPERTENSION 10/22/2006  . Lung nodule    unchanged since 05/26/13   . Migraines   . NASH (nonalcoholic steatohepatitis)   . Nocturnal hypoxemia 02/17/2013  . Obesity     . Obstructive sleep apnea   . OSA on CPAP    per neurology  . OSTEOARTHRITIS 10/22/2006  . Other chronic nonalcoholic liver disease 2/84/1324  . Sedimentation rate elevation   . Sleep apnea    on cpap  . Urine incontinence   . UTI (urinary tract infection)    Past Surgical History:  Procedure Laterality Date  . BREAST SURGERY  1984   Breast reduction b/l   . CATARACT EXTRACTION, BILATERAL    . DEXA  08/2005  . DILATION AND CURETTAGE OF UTERUS    . ELECTROCARDIOGRAM  10/15/2006  . ESOPHAGOGASTRODUODENOSCOPY  12/08/2005  . FOOT SURGERY     hammertoe and bunion 05/2017   . Stress Cardiolite  10/21/2005  . sweat gland removal    . WISDOM TOOTH EXTRACTION     Social History   Socioeconomic History  . Marital status: Married    Spouse name: [name]  . Number of children: 2  . Years of education: College  . Highest education level: Not on file  Occupational History  . Occupation: Retired  Scientific laboratory technician  . Financial resource strain: Not on file  . Food insecurity:    Worry: Not on file    Inability: Not on file  . Transportation needs:    Medical: Not on file  Non-medical: Not on file  Tobacco Use  . Smoking status: Former Smoker    Packs/day: 2.00    Years: 20.00    Pack years: 40.00    Types: Cigarettes    Last attempt to quit: 03/03/1979    Years since quitting: 39.0  . Smokeless tobacco: Never Used  Substance and Sexual Activity  . Alcohol use: Yes    Comment: rare  . Drug use: [name]  . Sexual activity: Yes    Birth control/protection: Surgical  Lifestyle  . Physical activity:    Days per week: Not on file    Minutes per session: Not on file  . Stress: Not on file  Relationships  . Social connections:    Talks on phone: Not on file    Gets together: Not on file    Attends religious service: Not on file    Active member of club or organization: Not on file    Attends meetings of clubs or organizations: Not on file    Relationship status: Not on file  Other  Topics Concern  . Not on file  Social History Narrative   Patient is married [name]).   Patient has two children; 3 pregnancies 2 live births    Patient is retired Designer, jewellery, Chiropodist    Patient has a Financial risk analyst.   Patient is right-handed.   Patient lives at home with family.   Caffeine Use: 2 soda every other day   Former smoker 20+ years ago 2 ppd quit in 50s   [name] guns, wears seat belt, safe in relationship    Allergies  Allergen Reactions  . Aspirin   . Coconut Oil   . Lisinopril     REACTION: cough  . Metoprolol Itching  . Peanut-Containing Drug Products   . Penicillins     REACTION: rash  . Prednisone   . Strawberry Extract    Family History  Problem Relation Age of Onset  . Asthma Mother   . Depression Mother   . Bipolar disorder Mother   . Dementia Mother   . Arthritis Mother   . Breast cancer Mother        primary  . Colon cancer Mother        mets from breast  . Cancer Mother        colon  . Hypertension Father   . Migraines Father   . Cancer Maternal Aunt        ?  [name] Heart disease Maternal Grandmother   . Cancer Maternal Grandfather        stomach ?    Current Outpatient Medications (Endocrine & Metabolic):  .  metFORMIN (GLUCOPHAGE-XR) 500 MG 24 hr tablet, Take 2 tablets (1,000 mg total) by mouth at bedtime.   Current Outpatient Medications (Cardiovascular):  .  furosemide (LASIX) 40 MG tablet, Take 1 tablet (40 mg total) by mouth daily. [name]  losartan (COZAAR) 25 MG tablet, TAKE 1 TABLET EVERY DAY .  rosuvastatin (CRESTOR) 40 MG tablet, Take 1 tablet (40 mg total) by mouth daily. At night   Current Outpatient Medications (Respiratory):  .  fexofenadine (ALLEGRA) 180 MG tablet, Take 180 mg by mouth daily.   Current Outpatient Medications (Analgesics):  .  acetaminophen (TYLENOL) 500 MG tablet, Take 650 mg by mouth 3 (three) times daily as needed for pain.  .  naproxen sodium (ANAPROX) 220  MG tablet, Take 220 mg by mouth as needed. .  SUMAtriptan (IMITREX)  20 MG/ACT nasal spray, sumatriptan 20 mg/actuation nasal spray   Current Outpatient Medications (Hematological):  .  clopidogrel (PLAVIX) 75 MG tablet, Take 1 tablet (75 mg total) by mouth daily. .  Ferrous Sulfate (IRON) 325 (65 FE) MG TABS, Take 1 tablet by mouth daily.     Current Outpatient Medications (Other):  [name]  Calcium Carbonate-Vitamin D (CALCIUM 600 + D PO), Take 2 tablets by mouth daily.   [name]  gabapentin (NEURONTIN) 100 MG capsule, Take 2 capsules (200 mg total) by mouth at bedtime. [name]  glucosamine-chondroitin 500-400 MG tablet, Take 1 tablet by mouth 2 (two) times daily.   .  modafinil (PROVIGIL) 200 MG tablet, modafinil 200 mg tablet .  Multiple Vitamin (MULTIVITAMIN) tablet, Take 1 tablet by mouth daily.   .  Omega-3 Fatty Acids (FISH OIL) 1000 MG CAPS, Take 1 capsule by mouth daily. [name]  omeprazole (PRILOSEC) 20 MG capsule, Take 20 mg by mouth daily as needed.  .  potassium chloride SA (KLOR-CON M20) 20 MEQ tablet, Take 1 tablet (20 mEq total) by mouth daily. [name]  pyridOXINE (VITAMIN B-6) 100 MG tablet, Take 200 mg by mouth daily. .  SF 5000 PLUS 1.1 % CREA dental cream,  .  topiramate (TOPAMAX) 100 MG tablet, TAKE 1 TABLET EVERY DAY .  Turmeric 500 MG TABS, Take 2 tablets by mouth daily. [name]  venlafaxine XR (EFFEXOR XR) 37.5 MG 24 hr capsule, Take 1 capsule (37.5 mg total) by mouth daily with breakfast. .  vitamin E 200 UNIT capsule, Take 200 Units by mouth daily. [name]  Zoster Vaccine Live, PF, (ZOSTAVAX) 54562 UNT/0.65ML injection, Zostavax (PF) 19,400 unit/0.65 mL subcutaneous suspension  Current Facility-Administered Medications (Other):  .  0.9 %  sodium chloride infusion    Past medical history, social, surgical and family history all reviewed in electronic medical record.  [name] pertanent information unless stated regarding to the chief complaint.   Review of Systems:  [name] headache, visual changes, nausea,  vomiting, diarrhea, constipation, dizziness, abdominal pain, skin rash, fevers, chills, night sweats, weight loss, swollen lymph nodes, body aches, joint swelling, chest pain, shortness of breath, mood changes.  Positive muscle aches  Objective  Blood pressure (!) 110/52, pulse 74, height 5' 5"  (1.651 m), weight 200 lb (90.7 kg), SpO2 94 %.    General: [name] apparent distress alert and oriented x3 mood and affect normal, dressed appropriately.  HEENT: Pupils equal, extraocular movements intact  Respiratory: Patient's speak in full sentences and does not appear short of breath  Cardiovascular: [name] lower extremity edema, non tender, [name] erythema  Skin: Warm dry intact with [name] signs of infection or rash on extremities or on axial skeleton.  Abdomen: Soft nontender  Neuro: Cranial nerves II through XII are intact, neurovascularly intact in all extremities with 2+ DTRs and 2+ pulses.  Lymph: [name] lymphadenopathy of posterior or anterior cervical chain or axillae bilaterally.  Gait antalgic the patient has not been using a cane MSK:  tender with limited range of motion and stability and strength and tone of shoulders, elbows, wrist, hip, and ankles bilaterally.  Knee: Bilateral valgus deformity noted.  Abnormal thigh to calf ratio.  Tender to palpation over medial and PF joint line.  Improvement from previous exam ROM full in flexion and extension and lower leg rotation. instability with valgus force.  painful patellar compression but mild on the right and moderate on the left. Patellar glide with moderate crepitus. Patellar and quadriceps tendons unremarkable. Hamstring and quadriceps strength is  normal.   Back exam still shows the patient has some limited range of motion of 5 to 10 degrees in all planes.  Patient does have tightness with straight leg test but [name] radicular symptoms.  Neurovascularly intact distally    Impression and Recommendations:      The above documentation has been  reviewed and is accurate and complete Lyndal Pulley, DO       Note: This dictation was prepared with Dragon dictation along with smaller phrase technology. Any transcriptional errors that result from this process are unintentional.

## 2018-03-23 ENCOUNTER — Ambulatory Visit: Payer: Medicare Other | Admitting: Endocrinology

## 2018-03-23 ENCOUNTER — Ambulatory Visit (INDEPENDENT_AMBULATORY_CARE_PROVIDER_SITE_OTHER): Payer: Medicare Other | Admitting: Family Medicine

## 2018-03-23 DIAGNOSIS — M5136 Other intervertebral disc degeneration, lumbar region: Secondary | ICD-10-CM | POA: Diagnosis not present

## 2018-03-23 DIAGNOSIS — M170 Bilateral primary osteoarthritis of knee: Secondary | ICD-10-CM | POA: Diagnosis not present

## 2018-03-23 NOTE — Patient Instructions (Signed)
Good to see you  Keep it up  I am proud of you  Increase activity as tolerated  See me again in 2 months

## 2018-03-23 NOTE — Assessment & Plan Note (Signed)
Stable after injections.  Discussed icing regimen.  Can repeat injections every 10 weeks if needed.  Follow-up again in 2 months

## 2018-03-23 NOTE — Assessment & Plan Note (Signed)
Patient responded well to the epidural.  Patient has had significant decrease in back pain from previous exams.  Doing well.  No change in management.  Follow-up again in 2 months

## 2018-03-30 DIAGNOSIS — I2510 Atherosclerotic heart disease of native coronary artery without angina pectoris: Secondary | ICD-10-CM | POA: Diagnosis not present

## 2018-03-30 DIAGNOSIS — I10 Essential (primary) hypertension: Secondary | ICD-10-CM | POA: Diagnosis not present

## 2018-03-30 DIAGNOSIS — G4733 Obstructive sleep apnea (adult) (pediatric): Secondary | ICD-10-CM | POA: Diagnosis not present

## 2018-03-30 DIAGNOSIS — E119 Type 2 diabetes mellitus without complications: Secondary | ICD-10-CM | POA: Diagnosis not present

## 2018-03-30 DIAGNOSIS — K449 Diaphragmatic hernia without obstruction or gangrene: Secondary | ICD-10-CM | POA: Diagnosis not present

## 2018-04-05 ENCOUNTER — Ambulatory Visit (INDEPENDENT_AMBULATORY_CARE_PROVIDER_SITE_OTHER): Payer: Medicare Other | Admitting: Family Medicine

## 2018-04-05 ENCOUNTER — Encounter (INDEPENDENT_AMBULATORY_CARE_PROVIDER_SITE_OTHER): Payer: Self-pay | Admitting: Family Medicine

## 2018-04-05 VITALS — BP 101/65 | HR 65 | Temp 97.9°F | Ht 65.0 in | Wt 196.0 lb

## 2018-04-05 DIAGNOSIS — E119 Type 2 diabetes mellitus without complications: Secondary | ICD-10-CM | POA: Diagnosis not present

## 2018-04-05 DIAGNOSIS — E669 Obesity, unspecified: Secondary | ICD-10-CM

## 2018-04-05 DIAGNOSIS — Z6832 Body mass index (BMI) 32.0-32.9, adult: Secondary | ICD-10-CM

## 2018-04-05 NOTE — Progress Notes (Signed)
Office: [phone]  /  Fax: [phone]   HPI:   Chief Complaint: OBESITY Adamaris is here to discuss her progress with her obesity treatment plan. She is on the lower carbohydrate, vegetable, and lean protein rich diet plan and is following her eating plan approximately 65 % of the time. She states she is walking 1 time per week. Chery continues to do well with weight loss, but notes some increased sweet cravings. She has started to stockpile candy and feels out of control. She has strategies to portion control.  Her weight is 196 lb (88.9 kg) today and has had a weight loss of 2 pounds over a period of 3 weeks since her last visit. She has lost 72 lbs since starting treatment with [name].  Diabetes II Tempestt has a diagnosis of diabetes type II. Lundyn's last A1c was 6.0 on 12/01/17 and she is stable on metformin. She denies nausea, vomiting, or hypoglycemia. She has been working on intensive lifestyle modifications including diet, exercise, and weight loss to help control her blood glucose levels.  ASSESSMENT AND PLAN:  Type 2 diabetes mellitus without complication, without long-term current use of insulin (HCC)  Class 1 obesity with serious comorbidity and body mass index (BMI) of 32.0 to 32.9 in adult, unspecified obesity type  PLAN:  Diabetes II Averianna has been given extensive diabetes education by myself today including ideal fasting and post-prandial blood glucose readings, individual ideal Hgb A1c goals, and hypoglycemia prevention. We discussed the importance of good blood sugar control to decrease the likelihood of diabetic complications such as nephropathy, neuropathy, limb loss, blindness, coronary artery disease, and death. We discussed the importance of intensive lifestyle modification including diet, exercise and weight loss as the first line treatment for diabetes. Maris agrees to continue her metformin and diet. She agrees to follow up in 4 weeks.  I spent > than 50% of the 25  minute visit on counseling as documented in the note.  Obesity Tyjanae is currently in the action stage of change. As such, her goal is to continue with weight loss efforts. She has agreed to follow the Category 2 or the lower carbohydrate, vegetable, and lean protein rich diet plan. Nechelle has been instructed to work up to a goal of 150 minutes of combined cardio and strengthening exercise per week for weight loss and overall health benefits. We discussed the following Behavioral Modification Strategies today: increasing lean protein intake, decreasing simple carbohydrates, and work on meal planning and easy cooking plans.  Rilea has agreed to follow up with our clinic in 4 weeks for a fasting appointment. She was informed of the importance of frequent follow up visits to maximize her success with intensive lifestyle modifications for her multiple health conditions.  ALLERGIES: Allergies  Allergen Reactions  . Aspirin   . Coconut Oil   . Lisinopril     REACTION: cough  . Metoprolol Itching  . Peanut-Containing Drug Products   . Penicillins     REACTION: rash  . Prednisone   . Strawberry Extract     MEDICATIONS: Current Outpatient Medications on File Prior to Visit  Medication Sig Dispense Refill  . acetaminophen (TYLENOL) 500 MG tablet Take 650 mg by mouth 3 (three) times daily as needed for pain.     . Calcium Carbonate-Vitamin D (CALCIUM 600 + D PO) Take 2 tablets by mouth daily.      . clopidogrel (PLAVIX) 75 MG tablet Take 1 tablet (75 mg total) by mouth daily. 90 tablet  3  . Ferrous Sulfate (IRON) 325 (65 FE) MG TABS Take 1 tablet by mouth daily.      . fexofenadine (ALLEGRA) 180 MG tablet Take 180 mg by mouth daily.    . furosemide (LASIX) 40 MG tablet Take 1 tablet (40 mg total) by mouth daily. 90 tablet 3  . gabapentin (NEURONTIN) 100 MG capsule Take 2 capsules (200 mg total) by mouth at bedtime. 180 capsule 3  . glucosamine-chondroitin 500-400 MG tablet Take 1 tablet by  mouth 2 (two) times daily.      [name] losartan (COZAAR) 25 MG tablet TAKE 1 TABLET EVERY DAY    . metFORMIN (GLUCOPHAGE-XR) 500 MG 24 hr tablet Take 2 tablets (1,000 mg total) by mouth at bedtime. 180 tablet 3  . modafinil (PROVIGIL) 200 MG tablet modafinil 200 mg tablet    . Multiple Vitamin (MULTIVITAMIN) tablet Take 1 tablet by mouth daily.      . naproxen sodium (ANAPROX) 220 MG tablet Take 220 mg by mouth as needed.    . Omega-3 Fatty Acids (FISH OIL) 1000 MG CAPS Take 1 capsule by mouth daily.    [name] omeprazole (PRILOSEC) 20 MG capsule Take 20 mg by mouth daily as needed.     . potassium chloride SA (KLOR-CON M20) 20 MEQ tablet Take 1 tablet (20 mEq total) by mouth daily. 90 tablet 3  . pyridOXINE (VITAMIN B-6) 100 MG tablet Take 200 mg by mouth daily.    . rosuvastatin (CRESTOR) 40 MG tablet Take 1 tablet (40 mg total) by mouth daily. At night 90 tablet 3  . SF 5000 PLUS 1.1 % CREA dental cream   0  . SUMAtriptan (IMITREX) 20 MG/ACT nasal spray sumatriptan 20 mg/actuation nasal spray    . topiramate (TOPAMAX) 100 MG tablet TAKE 1 TABLET EVERY DAY 90 tablet 3  . Turmeric 500 MG TABS Take 2 tablets by mouth daily.    [name] venlafaxine XR (EFFEXOR XR) 37.5 MG 24 hr capsule Take 1 capsule (37.5 mg total) by mouth daily with breakfast. 90 capsule 1  . vitamin E 200 UNIT capsule Take 200 Units by mouth daily.    [name] Zoster Vaccine Live, PF, (ZOSTAVAX) 05397 UNT/0.65ML injection Zostavax (PF) 19,400 unit/0.65 mL subcutaneous suspension     Current Facility-Administered Medications on File Prior to Visit  Medication Dose Route Frequency Provider Last Rate Last Dose  . 0.9 %  sodium chloride infusion  500 mL Intravenous Continuous Nandigam, Venia Minks, MD        PAST MEDICAL HISTORY: Past Medical History:  Diagnosis Date  . Abdominal hernia   . Abnormal Pap smear   . ALLERGIC RHINITIS 10/22/2006  . Allergy   . ANEMIA 12/18/2008  . Arthritis    scoliosis moderate deg changes lumbar Xray 11/04/17   .  ASYMPTOMATIC POSTMENOPAUSAL STATUS 11/22/2007  . Back pain   . CAD (coronary artery disease)    in LAD  . Complication of anesthesia   . Degenerative arthritis   . DIABETES MELLITUS, TYPE II 01/13/2007  . Dyslipidemia   . Fibroid   . Frequent headaches   . GERD 07/20/2007  . GOITER, MULTINODULAR 07/20/2007  . Headache(784.0) 07/20/2007  . HEARING LOSS 11/22/2007  . Hiatal hernia   . Hiatal hernia    large  . History of chicken pox   . Hx of colposcopy with cervical biopsy   . HYPERCHOLESTEROLEMIA 01/13/2007  . Hyperglycemia   . HYPERTENSION 10/22/2006  . Lung nodule    unchanged  since 05/26/13   . Migraines   . NASH (nonalcoholic steatohepatitis)   . Nocturnal hypoxemia 02/17/2013  . Obesity   . Obstructive sleep apnea   . OSA on CPAP    per neurology  . OSTEOARTHRITIS 10/22/2006  . Other chronic nonalcoholic liver disease 5/95/6387  . Sedimentation rate elevation   . Sleep apnea    on cpap  . Urine incontinence   . UTI (urinary tract infection)     PAST SURGICAL HISTORY: Past Surgical History:  Procedure Laterality Date  . BREAST SURGERY  1984   Breast reduction b/l   . CATARACT EXTRACTION, BILATERAL    . DEXA  08/2005  . DILATION AND CURETTAGE OF UTERUS    . ELECTROCARDIOGRAM  10/15/2006  . ESOPHAGOGASTRODUODENOSCOPY  12/08/2005  . FOOT SURGERY     hammertoe and bunion 05/2017   . Stress Cardiolite  10/21/2005  . sweat gland removal    . WISDOM TOOTH EXTRACTION      SOCIAL HISTORY: Social History   Tobacco Use  . Smoking status: Former Smoker    Packs/day: 2.00    Years: 20.00    Pack years: 40.00    Types: Cigarettes    Last attempt to quit: 03/03/1979    Years since quitting: 39.1  . Smokeless tobacco: Never Used  Substance Use Topics  . Alcohol use: Yes    Comment: rare  . Drug use: No    FAMILY HISTORY: Family History  Problem Relation Age of Onset  . Asthma Mother   . Depression Mother   . Bipolar disorder Mother   . Dementia Mother   .  Arthritis Mother   . Breast cancer Mother        primary  . Colon cancer Mother        mets from breast  . Cancer Mother        colon  . Hypertension Father   . Migraines Father   . Cancer Maternal Aunt        ?  [name] Heart disease Maternal Grandmother   . Cancer Maternal Grandfather        stomach ?    ROS: Review of Systems  Constitutional: Positive for weight loss.  Gastrointestinal: Negative for nausea and vomiting.  Endo/Heme/Allergies:       Negative for hypoglycemia.   PHYSICAL EXAM: Blood pressure 101/65, pulse 65, temperature 97.9 F (36.6 C), temperature source Oral, height 5' 5"  (1.651 m), weight 196 lb (88.9 kg), SpO2 97 %. Body mass index is 32.62 kg/m. Physical Exam Vitals signs reviewed.  Constitutional:      Appearance: Normal appearance. She is obese.  Cardiovascular:     Rate and Rhythm: Normal rate.  Pulmonary:     Effort: Pulmonary effort is normal.  Musculoskeletal: Normal range of motion.  Skin:    General: Skin is warm and dry.  Neurological:     Mental Status: She is alert and oriented to person, place, and time.  Psychiatric:        Mood and Affect: Mood normal.        Behavior: Behavior normal.    RECENT LABS AND TESTS: BMET    Component Value Date/Time   NA 142 12/01/2017 1146   K 3.9 12/01/2017 1146   CL 103 12/01/2017 1146   CO2 27 12/01/2017 1146   GLUCOSE 83 12/01/2017 1146   GLUCOSE 116 (H) 03/05/2016 0958   BUN 10 12/01/2017 1146   CREATININE 0.68 12/01/2017 1146   CREATININE  0.81 12/28/2011 0853   CALCIUM 9.2 12/01/2017 1146   GFRNONAA 88 12/01/2017 1146   GFRAA 102 12/01/2017 1146   Lab Results  Component Value Date   HGBA1C 6.0 (H) 12/01/2017   HGBA1C 5.7 (H) 07/29/2017   HGBA1C 5.9 03/15/2017   HGBA1C 6.3 (H) 12/23/2016   HGBA1C 6.3 09/08/2016   Lab Results  Component Value Date   INSULIN 5.6 12/01/2017   INSULIN 6.8 07/29/2017   INSULIN 20.9 12/23/2016   CBC    Component Value Date/Time   WBC 4.0  12/01/2017 1146   WBC 7.2 03/05/2016 0958   RBC 4.35 12/01/2017 1146   RBC 4.46 03/05/2016 0958   HGB 12.4 12/01/2017 1146   HCT 39.5 12/01/2017 1146   PLT 300.0 03/05/2016 0958   MCV 91 12/01/2017 1146   MCH 28.5 12/01/2017 1146   MCH 29.4 12/28/2011 0853   MCHC 31.4 (L) 12/01/2017 1146   MCHC 32.9 03/05/2016 0958   RDW 16.2 (H) 12/01/2017 1146   LYMPHSABS 1.2 12/01/2017 1146   MONOABS 0.4 03/05/2016 0958   EOSABS 0.1 12/01/2017 1146   BASOSABS 0.0 12/01/2017 1146   Iron/TIBC/Ferritin/ %Sat    Component Value Date/Time   IRON 38 12/23/2016 0956   TIBC 271 12/23/2016 0956   FERRITIN 40 12/23/2016 0956   IRONPCTSAT 14 (L) 12/23/2016 0956   Lipid Panel     Component Value Date/Time   CHOL 178 12/01/2017 1146   TRIG 66 12/01/2017 1146   HDL 76 12/01/2017 1146   CHOLHDL 3 03/05/2016 0958   VLDL 26.0 03/05/2016 0958   LDLCALC 89 12/01/2017 1146   Hepatic Function Panel     Component Value Date/Time   PROT 7.0 12/01/2017 1146   ALBUMIN 4.1 12/01/2017 1146   AST 20 12/01/2017 1146   ALT 20 12/01/2017 1146   ALKPHOS 78 12/01/2017 1146   BILITOT <0.2 12/01/2017 1146   BILIDIR 0.1 02/15/2015 1010   IBILI 0.2 12/28/2011 0853      Component Value Date/Time   TSH 1.170 12/23/2016 0956   TSH 1.76 03/05/2016 0958   TSH 1.86 02/15/2015 1010   Results for SYNIA, DOUGLASS (MRN [id number]) as of 04/05/2018 15:21  Ref. Range 12/01/2017 11:46  Vitamin D, 25-Hydroxy Latest Ref Range: 30.0 - 100.0 ng/mL 34.2   OBESITY BEHAVIORAL INTERVENTION VISIT  Today's visit was # 23   Starting weight: 268 lbs Starting date: 12/23/16 Today's weight : Weight: 196 lb (88.9 kg)  Today's date: 04/05/2018 Total lbs lost to date: 63  ASK: We discussed the diagnosis of obesity with Cherlynn June today and Varie agreed to give [name] permission to discuss obesity behavioral modification therapy today.  ASSESS: Britaney has the diagnosis of obesity and her BMI today is 32.6. Shacarra is in the  action stage of change.   ADVISE: Parisa was educated on the multiple health risks of obesity as well as the benefit of weight loss to improve her health. She was advised of the need for long term treatment and the importance of lifestyle modifications to improve her current health and to decrease her risk of future health problems.  AGREE: Multiple dietary modification options and treatment options were discussed and Aliene agreed to follow the recommendations documented in the above note.  ARRANGE: Zayanna was educated on the importance of frequent visits to treat obesity as outlined per CMS and USPSTF guidelines and agreed to schedule her next follow up appointment today.  Lenward Chancellor, CMA, am acting as transcriptionist for  Starlyn Skeans, MD  I have reviewed the above documentation for accuracy and completeness, and I agree with the above. -[name], MD

## 2018-04-07 ENCOUNTER — Ambulatory Visit: Payer: Medicare Other | Admitting: Internal Medicine

## 2018-04-18 DIAGNOSIS — Z7984 Long term (current) use of oral hypoglycemic drugs: Secondary | ICD-10-CM | POA: Diagnosis not present

## 2018-04-18 DIAGNOSIS — E119 Type 2 diabetes mellitus without complications: Secondary | ICD-10-CM | POA: Diagnosis not present

## 2018-04-18 DIAGNOSIS — H3540 Unspecified peripheral retinal degeneration: Secondary | ICD-10-CM | POA: Diagnosis not present

## 2018-04-18 DIAGNOSIS — H43813 Vitreous degeneration, bilateral: Secondary | ICD-10-CM | POA: Diagnosis not present

## 2018-04-18 DIAGNOSIS — Z961 Presence of intraocular lens: Secondary | ICD-10-CM | POA: Diagnosis not present

## 2018-04-26 ENCOUNTER — Encounter: Payer: Self-pay | Admitting: Internal Medicine

## 2018-04-26 ENCOUNTER — Ambulatory Visit (INDEPENDENT_AMBULATORY_CARE_PROVIDER_SITE_OTHER): Payer: Medicare Other | Admitting: Internal Medicine

## 2018-04-26 VITALS — BP 112/60 | HR 73 | Temp 98.3°F | Ht 65.0 in | Wt 200.0 lb

## 2018-04-26 DIAGNOSIS — E538 Deficiency of other specified B group vitamins: Secondary | ICD-10-CM | POA: Diagnosis not present

## 2018-04-26 DIAGNOSIS — J309 Allergic rhinitis, unspecified: Secondary | ICD-10-CM | POA: Diagnosis not present

## 2018-04-26 DIAGNOSIS — E119 Type 2 diabetes mellitus without complications: Secondary | ICD-10-CM | POA: Diagnosis not present

## 2018-04-26 DIAGNOSIS — R599 Enlarged lymph nodes, unspecified: Secondary | ICD-10-CM

## 2018-04-26 DIAGNOSIS — R413 Other amnesia: Secondary | ICD-10-CM | POA: Diagnosis not present

## 2018-04-26 DIAGNOSIS — R600 Localized edema: Secondary | ICD-10-CM

## 2018-04-26 DIAGNOSIS — T148XXA Other injury of unspecified body region, initial encounter: Secondary | ICD-10-CM | POA: Diagnosis not present

## 2018-04-26 DIAGNOSIS — R2241 Localized swelling, mass and lump, right lower limb: Secondary | ICD-10-CM

## 2018-04-26 DIAGNOSIS — E611 Iron deficiency: Secondary | ICD-10-CM

## 2018-04-26 DIAGNOSIS — I10 Essential (primary) hypertension: Secondary | ICD-10-CM | POA: Diagnosis not present

## 2018-04-26 DIAGNOSIS — K449 Diaphragmatic hernia without obstruction or gangrene: Secondary | ICD-10-CM | POA: Diagnosis not present

## 2018-04-26 DIAGNOSIS — R3915 Urgency of urination: Secondary | ICD-10-CM | POA: Diagnosis not present

## 2018-04-26 DIAGNOSIS — R1907 Generalized intra-abdominal and pelvic swelling, mass and lump: Secondary | ICD-10-CM

## 2018-04-26 DIAGNOSIS — E049 Nontoxic goiter, unspecified: Secondary | ICD-10-CM

## 2018-04-26 NOTE — Progress Notes (Signed)
Pre visit review using our clinic review tool, if applicable. No additional management support is needed unless otherwise documented below in the visit note. 

## 2018-04-26 NOTE — Progress Notes (Signed)
Chief Complaint  Patient presents with  . Follow-up   F/u with husband Mr. T 1. C/o right thigh upper inner thigh mass w/o pain since last visit shes noticed and been Producer, television/film/video on internet and c/w blood clot  2. Memory loss esp short term disc with pt and husband consider CT head  3. Pt c/o bruising on both thighs with FH of bruising she is on plavix and disc this can be cause of bruising. No h/o trauma  4. Allergic rhinitis off allegra D and initially nose was runny and she had h/a but now she is better just on regular allegra but her nose scabs on the inside and puts vasoline which helps -allergies controlled now declines atrovent nasal, singulair, denies bactroban nasal for scabs in nose   5. HTN controlled lasix 40 mg qd and losartan 25 mg qd  6. Large hiatal hernia with stomach and portion of colon she saw [name] and is w/o sx's but if she has chest pain due to hiatal hernia he rec she [facility] and he will consult but will not repair currently as she is w/o sxs.     Review of Systems  Constitutional: Negative for weight loss.  HENT: Negative for hearing loss.   Eyes: Negative for blurred vision.  Respiratory: Negative for shortness of breath.   Cardiovascular: Negative for chest pain.  Gastrointestinal: Negative for abdominal pain and heartburn.  Musculoskeletal: Negative for falls.  Skin: Negative for rash.       Right thigh mass small and appears to be reducing in size    Neurological: Negative for headaches.  Endo/Heme/Allergies: Bruises/bleeds easily.  Psychiatric/Behavioral: Positive for memory loss.   Past Medical History:  Diagnosis Date  . Abdominal hernia   . Abnormal Pap smear   . ALLERGIC RHINITIS 10/22/2006  . Allergy   . ANEMIA 12/18/2008  . Arthritis    scoliosis moderate deg changes lumbar Xray 11/04/17   . ASYMPTOMATIC POSTMENOPAUSAL STATUS 11/22/2007  . Back pain   . CAD (coronary artery disease)    in LAD  .  Complication of anesthesia   . Degenerative arthritis   . DIABETES MELLITUS, TYPE II 01/13/2007  . Dyslipidemia   . Fibroid   . Frequent headaches   . GERD 07/20/2007  . GOITER, MULTINODULAR 07/20/2007  . Headache(784.0) 07/20/2007  . HEARING LOSS 11/22/2007  . Hiatal hernia   . Hiatal hernia    large  . History of chicken pox   . Hx of colposcopy with cervical biopsy   . HYPERCHOLESTEROLEMIA 01/13/2007  . Hyperglycemia   . HYPERTENSION 10/22/2006  . Lung nodule    unchanged since 05/26/13   . Migraines   . NASH (nonalcoholic steatohepatitis)   . Nocturnal hypoxemia 02/17/2013  . Obesity   . Obstructive sleep apnea   . OSA on CPAP    per neurology  . OSTEOARTHRITIS 10/22/2006  . Other chronic nonalcoholic liver disease 05/13/9700  . Sedimentation rate elevation   . Sleep apnea    on cpap  . Urine incontinence   . UTI (urinary tract infection)    Past Surgical History:  Procedure Laterality Date  . BREAST SURGERY  1984   Breast reduction b/l   . CATARACT EXTRACTION, BILATERAL    . DEXA  08/2005  . DILATION AND CURETTAGE OF UTERUS    . ELECTROCARDIOGRAM  10/15/2006  . ESOPHAGOGASTRODUODENOSCOPY  12/08/2005  . FOOT SURGERY     hammertoe and  bunion 05/2017   . Stress Cardiolite  10/21/2005  . sweat gland removal    . WISDOM TOOTH EXTRACTION     Family History  Problem Relation Age of Onset  . Asthma Mother   . Depression Mother   . Bipolar disorder Mother   . Dementia Mother   . Arthritis Mother   . Breast cancer Mother        primary  . Colon cancer Mother        mets from breast  . Cancer Mother        colon  . Hypertension Father   . Migraines Father   . Cancer Maternal Aunt        ?  [name] Heart disease Maternal Grandmother   . Cancer Maternal Grandfather        stomach ?   Social History   Socioeconomic History  . Marital status: Married    Spouse name: [name]  . Number of children: 2  . Years of education: College  . Highest education level: Not on  file  Occupational History  . Occupation: Retired  Scientific laboratory technician  . Financial resource strain: Not on file  . Food insecurity:    Worry: Not on file    Inability: Not on file  . Transportation needs:    Medical: Not on file    Non-medical: Not on file  Tobacco Use  . Smoking status: Former Smoker    Packs/day: 2.00    Years: 20.00    Pack years: 40.00    Types: Cigarettes    Last attempt to quit: 03/03/1979    Years since quitting: 39.1  . Smokeless tobacco: Never Used  Substance and Sexual Activity  . Alcohol use: Yes    Comment: rare  . Drug use: No  . Sexual activity: Yes    Birth control/protection: Surgical  Lifestyle  . Physical activity:    Days per week: Not on file    Minutes per session: Not on file  . Stress: Not on file  Relationships  . Social connections:    Talks on phone: Not on file    Gets together: Not on file    Attends religious service: Not on file    Active member of club or organization: Not on file    Attends meetings of clubs or organizations: Not on file    Relationship status: Not on file  . Intimate partner violence:    Fear of current or ex partner: Not on file    Emotionally abused: Not on file    Physically abused: Not on file    Forced sexual activity: Not on file  Other Topics Concern  . Not on file  Social History Narrative   Patient is married [name]).   Patient has two children; 3 pregnancies 2 live births    Patient is retired Designer, jewellery, Chiropodist    Patient has a Financial risk analyst.   Patient is right-handed.   Patient lives at home with family.   Caffeine Use: 2 soda every other day   Former smoker 20+ years ago 2 ppd quit in 50s   No guns, wears seat belt, safe in relationship    Current Meds  Medication Sig  . acetaminophen (TYLENOL) 500 MG tablet Take 650 mg by mouth 3 (three) times daily as needed for pain.   . Calcium Carbonate-Vitamin D (CALCIUM 600 + D PO) Take 2  tablets by mouth daily.    [name]  clopidogrel (PLAVIX) 75 MG tablet Take 1 tablet (75 mg total) by mouth daily.  . Ferrous Sulfate (IRON) 325 (65 FE) MG TABS Take 1 tablet by mouth daily.    . fexofenadine (ALLEGRA) 180 MG tablet Take 180 mg by mouth daily.  . furosemide (LASIX) 40 MG tablet Take 1 tablet (40 mg total) by mouth daily.  [name] gabapentin (NEURONTIN) 100 MG capsule Take 2 capsules (200 mg total) by mouth at bedtime.  [name] glucosamine-chondroitin 500-400 MG tablet Take 1 tablet by mouth 2 (two) times daily.    [name] losartan (COZAAR) 25 MG tablet TAKE 1 TABLET EVERY DAY  . metFORMIN (GLUCOPHAGE-XR) 500 MG 24 hr tablet Take 2 tablets (1,000 mg total) by mouth at bedtime.  . modafinil (PROVIGIL) 200 MG tablet modafinil 200 mg tablet  . Multiple Vitamin (MULTIVITAMIN) tablet Take 1 tablet by mouth daily.    . naproxen sodium (ANAPROX) 220 MG tablet Take 220 mg by mouth as needed.  . Omega-3 Fatty Acids (FISH OIL) 1000 MG CAPS Take 1 capsule by mouth daily.  [name] omeprazole (PRILOSEC) 20 MG capsule Take 20 mg by mouth daily as needed.   . potassium chloride SA (KLOR-CON M20) 20 MEQ tablet Take 1 tablet (20 mEq total) by mouth daily.  [name] pyridOXINE (VITAMIN B-6) 100 MG tablet Take 200 mg by mouth daily.  . rosuvastatin (CRESTOR) 40 MG tablet Take 1 tablet (40 mg total) by mouth daily. At night  . SF 5000 PLUS 1.1 % CREA dental cream   . SUMAtriptan (IMITREX) 20 MG/ACT nasal spray sumatriptan 20 mg/actuation nasal spray  . topiramate (TOPAMAX) 100 MG tablet TAKE 1 TABLET EVERY DAY  . Turmeric 500 MG TABS Take 2 tablets by mouth daily.  [name] venlafaxine XR (EFFEXOR XR) 37.5 MG 24 hr capsule Take 1 capsule (37.5 mg total) by mouth daily with breakfast.  . vitamin E 200 UNIT capsule Take 200 Units by mouth daily.  [name] Zoster Vaccine Live, PF, (ZOSTAVAX) 63149 UNT/0.65ML injection Zostavax (PF) 19,400 unit/0.65 mL subcutaneous suspension   Current Facility-Administered Medications for the 04/26/18 encounter  (Office Visit) with McLean-Scocuzza, [name], MD  Medication  . 0.9 %  sodium chloride infusion   Allergies  Allergen Reactions  . Aspirin   . Coconut Oil   . Lisinopril     REACTION: cough  . Metoprolol Itching  . Peanut-Containing Drug Products   . Penicillins     REACTION: rash  . Prednisone   . Strawberry Extract    No results found for this or any previous visit (from the past 2160 hour(s)). Objective  Body mass index is 33.28 kg/m. Wt Readings from Last 3 Encounters:  04/26/18 200 lb (90.7 kg)  04/05/18 196 lb (88.9 kg)  03/23/18 200 lb (90.7 kg)   Temp Readings from Last 3 Encounters:  04/26/18 98.3 F (36.8 C) (Oral)  04/05/18 97.9 F (36.6 C) (Oral)  03/14/18 97.8 F (36.6 C) (Oral)   BP Readings from Last 3 Encounters:  04/26/18 112/60  04/05/18 101/65  03/23/18 (!) 110/52   Pulse Readings from Last 3 Encounters:  04/26/18 73  04/05/18 65  03/23/18 74    Physical Exam Vitals signs and nursing note reviewed.  Constitutional:      Appearance: Normal appearance. She is well-developed and well-groomed. She is obese.  HENT:     Head: Normocephalic and atraumatic.     Mouth/Throat:     Mouth: Mucous membranes are moist.     Pharynx: Oropharynx is  clear.  Eyes:     Conjunctiva/sclera: Conjunctivae normal.     Pupils: Pupils are equal, round, and reactive to light.  Cardiovascular:     Rate and Rhythm: Normal rate and regular rhythm.     Heart sounds: Normal heart sounds. No murmur.  Pulmonary:     Effort: Pulmonary effort is normal.     Breath sounds: Normal breath sounds.  Lymphadenopathy:     Comments: 0.5-1 cm mass right medial thigh ? Lymphadenopathy vs other    Skin:    General: Skin is warm and dry.     Findings: Bruising present.       Neurological:     General: No focal deficit present.     Mental Status: She is alert and oriented to person, place, and time. Mental status is at baseline.     Gait: Gait normal.  Psychiatric:         Attention and Perception: Attention and perception normal.        Mood and Affect: Mood and affect normal.        Speech: Speech normal.        Behavior: Behavior normal. Behavior is cooperative.        Thought Content: Thought content normal.        Cognition and Memory: Cognition and memory normal.        Judgment: Judgment normal.     Assessment   1. Right medial thigh 0.5-1 cm hard mass ?lipoma vs lymph node vs other  2. Memory loss short term could be age related  3. Easy bruising with FH also on plavix qd with elevated PT 4. Allergic rhinitis  5. HTN 6. Large hiatal hernia with stomach and colon w/o sx's  7. HM Plan   1. Will do right thigh [name] vs CT disc with radiology  2. Consider CT brain w/u  B12 check today elevated  3. Check PT(slightly elevated 11.6 nl 11.5)/PTT/INR today  Consider h/o in future if bruising continues to r/o genetic/factor def d/o 4. If not controlled consider singulair, atrovent and bactroban for nasal sores  5. Cont lasix 40 and losartan 25 mg qd  6. F/u surgery Dr. Kreg Shropshire prn  7  Flu shot high dose utd  prevnar had 01/30/14 pna 23 had 12/18/08 due again  zostavax had 01/30/14 -consider shingrix   Tdap 01/30/14   DEXA 03/06/15 normal  mammo copy get from Dr. [facility]  -per pt she will f/u with Dr. Mancel Bale who ordered her mammogram   Pap out of age window Colonoscopy 06/18/16 severe diverticulosis polyp x 1 tubular adenoma FH mom colon cancer f/u in 5 years  HCV neg 12/28/11  Eye exam as at f/u and consider if not had  Former smoker quit in 34s smoker x 20 years+ 2 ppd. CT 01/14/18 mild COPD (off Advair), stable goiter, mild CAD, stable left lung nodule lower lobe likely benign   Down 70s #s trying weight stable since 09/2017 f/u with [facility] Dr. Leafy Ro, congratulated   Cardiologist Dr. [name]    Provider: Dr. Olivia Mackie McLean-Scocuzza-[facility]

## 2018-04-26 NOTE — Patient Instructions (Addendum)
D3 1000 IU daily  Calcium 600 mg 1-2 x per day     Lymphadenopathy  Lymphadenopathy means that your lymph glands are swollen or larger than normal (enlarged). Lymph glands, also called lymph nodes, are collections of tissue that filter bacteria, viruses, and waste from your bloodstream. They are part of your body's disease-fighting system (immune system), which protects your body from germs. There may be different causes of lymphadenopathy, depending on where it is in your body. Some types go away on their own. Lymphadenopathy can occur anywhere that you have lymph glands, including these areas:  Neck (cervical lymphadenopathy).  Chest (mediastinal lymphadenopathy).  Lungs (hilar lymphadenopathy).  Underarms (axillary lymphadenopathy).  Groin (inguinal lymphadenopathy). When your immune system responds to germs, infection-fighting cells and fluid build up in your lymph glands. This causes some swelling and enlargement. If the lymph glands do not go back to normal after you have an infection or disease, your health care provider may do tests. These tests help to monitor your condition and find the reason why the glands are still swollen and enlarged. Follow these instructions at home:  Get plenty of rest.  Take over-the-counter and prescription medicines only as told by your health care provider. Your health care provider may recommend over-the-counter medicines for pain.  If directed, apply heat to swollen lymph glands as often as told by your health care provider. Use the heat source that your health care provider recommends, such as a moist heat pack or a heating pad. ? Place a towel between your skin and the heat source. ? Leave the heat on for 20-30 minutes. ? Remove the heat if your skin turns bright red. This is especially important if you are unable to feel pain, heat, or cold. You may have a greater risk of getting burned.  Check your affected lymph glands every day for  changes. Check other lymph gland areas as told by your health care provider. Check for changes such as: ? More swelling. ? Sudden increase in size. ? Redness or pain. ? Hardness.  Keep all follow-up visits as told by your health care provider. This is important. Contact a health care provider if you have:  Swelling that gets worse or spreads to other areas.  Problems with breathing.  Lymph glands that: ? Are still swollen after 2 weeks. ? Have suddenly gotten bigger. ? Are red, painful, or hard.  A fever or chills.  Fatigue.  A sore throat.  Pain in your abdomen.  Weight loss.  Night sweats. Get help right away if you have:  Fluid leaking from an enlarged lymph gland.  Severe pain.  Chest pain.  Shortness of breath. Summary  Lymphadenopathy means that your lymph glands are swollen or larger than normal (enlarged).  Lymph glands (also called lymph nodes) are collections of tissue that filter bacteria, viruses, and waste from the bloodstream. They are part of your body's disease-fighting system (immune system).  Lymphadenopathy can occur anywhere that you have lymph glands.  If your enlarged and swollen lymph glands do not go back to normal after you have an infection or disease, your health care provider may do tests to monitor your condition and find the reason why the glands are still swollen and enlarged.  Check your affected lymph glands every day for changes. Check other lymph gland areas as told by your health care provider. This information is not intended to replace advice given to you by your health care provider. Make sure you discuss any  questions you have with your health care provider. Document Released: 11/26/2007 Document Revised: 01/01/2017 Document Reviewed: 01/01/2017 Elsevier Interactive Patient Education  2019 [employer].

## 2018-04-27 ENCOUNTER — Encounter (INDEPENDENT_AMBULATORY_CARE_PROVIDER_SITE_OTHER): Payer: Self-pay | Admitting: Family Medicine

## 2018-04-27 ENCOUNTER — Ambulatory Visit (INDEPENDENT_AMBULATORY_CARE_PROVIDER_SITE_OTHER): Payer: Medicare Other | Admitting: Family Medicine

## 2018-04-27 VITALS — BP 113/70 | HR 60 | Ht 65.0 in | Wt 194.0 lb

## 2018-04-27 DIAGNOSIS — Z6832 Body mass index (BMI) 32.0-32.9, adult: Secondary | ICD-10-CM

## 2018-04-27 DIAGNOSIS — E559 Vitamin D deficiency, unspecified: Secondary | ICD-10-CM

## 2018-04-27 DIAGNOSIS — E7849 Other hyperlipidemia: Secondary | ICD-10-CM | POA: Diagnosis not present

## 2018-04-27 DIAGNOSIS — E669 Obesity, unspecified: Secondary | ICD-10-CM | POA: Diagnosis not present

## 2018-04-27 DIAGNOSIS — E119 Type 2 diabetes mellitus without complications: Secondary | ICD-10-CM | POA: Diagnosis not present

## 2018-04-27 LAB — IRON,TIBC AND FERRITIN PANEL
%SAT: 17 % (calc) (ref 16–45)
Ferritin: 60 ng/mL (ref 16–288)
IRON: 43 ug/dL — AB (ref 45–160)
TIBC: 249 ug/dL — AB (ref 250–450)

## 2018-04-27 LAB — APTT: APTT: 30 s (ref 22–34)

## 2018-04-27 LAB — PROTIME-INR
INR: 1.1
PROTHROMBIN TIME: 11.6 s — AB (ref 9.0–11.5)

## 2018-04-27 LAB — CBC WITH DIFFERENTIAL/PLATELET
Basophils Absolute: 0 10*3/uL (ref 0.0–0.1)
Basophils Relative: 1 % (ref 0.0–3.0)
Eosinophils Absolute: 0.1 10*3/uL (ref 0.0–0.7)
Eosinophils Relative: 2 % (ref 0.0–5.0)
HCT: 38.3 % (ref 36.0–46.0)
HEMOGLOBIN: 12.9 g/dL (ref 12.0–15.0)
Lymphocytes Relative: 28.3 % (ref 12.0–46.0)
Lymphs Abs: 1.3 10*3/uL (ref 0.7–4.0)
MCHC: 33.7 g/dL (ref 30.0–36.0)
MCV: 91.2 fl (ref 78.0–100.0)
MONO ABS: 0.4 10*3/uL (ref 0.1–1.0)
Monocytes Relative: 7.9 % (ref 3.0–12.0)
Neutro Abs: 2.7 10*3/uL (ref 1.4–7.7)
Neutrophils Relative %: 60.8 % (ref 43.0–77.0)
Platelets: 248 10*3/uL (ref 150.0–400.0)
RBC: 4.19 Mil/uL (ref 3.87–5.11)
RDW: 16.7 % — ABNORMAL HIGH (ref 11.5–15.5)
WBC: 4.5 10*3/uL (ref 4.0–10.5)

## 2018-04-27 LAB — MICROALBUMIN / CREATININE URINE RATIO
Creatinine, Urine: 115 mg/dL (ref 20–275)
Microalb Creat Ratio: 3 mcg/mg creat (ref <30)
Microalb, Ur: 0.4 mg/dL

## 2018-04-27 LAB — URINALYSIS, ROUTINE W REFLEX MICROSCOPIC
Bilirubin Urine: NEGATIVE
GLUCOSE, UA: NEGATIVE
Hgb urine dipstick: NEGATIVE
KETONES UR: NEGATIVE
Leukocytes,Ua: NEGATIVE
Nitrite: NEGATIVE
Protein, ur: NEGATIVE
SPECIFIC GRAVITY, URINE: 1.023 (ref 1.001–1.03)
pH: 8 (ref 5.0–8.0)

## 2018-04-27 NOTE — Progress Notes (Signed)
Office: [phone]  /  Fax: [phone]   HPI:   Chief Complaint: OBESITY Heather [name] is here to discuss her progress with her obesity treatment plan. She is on the Category 2 plan and is following her eating plan approximately 80% of the time. She states she is exercising 0 minutes 0 times per week. Heather [name] continues to do well with the Category 2 plan. She started on her ellipticals and is using it 2-5 times a week for 10 minutes. Hunger is controlled and Heather [name] is doing well with the meal plan. Her weight is 194 lb (88 kg) today and has had a weight loss of 2 pounds over a period of 3 weeks since her last visit. She has lost 74 lbs since starting treatment with [name].  Diabetes II Heather [name] has a diagnosis of diabetes type II. Last A1c was improved with diet. She is stable on metformin and denies nausea, vomiting, or hypoglycemia. She has been working on intensive lifestyle modifications including diet, exercise, and weight loss to help control her blood glucose levels.  Hyperlipidemia Heather [name] has hyperlipidemia and is currently on Crestor. She has been trying to improve her cholesterol levels with intensive lifestyle modification including a low saturated fat diet, exercise and weight loss. She denies any chest pain or myalgias. Heather [name] is currently due for labs.  Vitamin D deficiency Heather [name] has a diagnosis of Vitamin D deficiency. She is currently stable on Vit D and denies nausea, vomiting or muscle weakness. Heather [name] is currently due for labs.  ASSESSMENT AND PLAN:  Type 2 diabetes mellitus without complication, without long-term current use of insulin (HCC) - Plan: Comprehensive metabolic panel, Hemoglobin A1c, Insulin, random  Other hyperlipidemia - Plan: Lipid Panel With LDL/HDL Ratio  Vitamin D deficiency - Plan: VITAMIN D 25 Hydroxy (Vit-D Deficiency, Fractures)  Class 1 obesity with serious comorbidity and body mass index (BMI) of 32.0 to 32.9 in adult, unspecified obesity  type  PLAN:  Diabetes II Heather [name] has been given extensive diabetes education by myself today including ideal fasting and post-prandial blood glucose readings, individual ideal HgA1c goals  and hypoglycemia prevention. We discussed the importance of good blood sugar control to decrease the likelihood of diabetic complications such as nephropathy, neuropathy, limb loss, blindness, coronary artery disease, and death. We discussed the importance of intensive lifestyle modification including diet, exercise and weight loss as the first line treatment for diabetes. Heather [name] agrees to continue her metformin, diet, and exercise. She will have labs drawn today and agrees to follow-up with our clinic in 3-4 weeks.  Hyperlipidemia Heather [name] was informed of the American Heart Association Guidelines emphasizing intensive lifestyle modifications as the first line treatment for hyperlipidemia. We discussed many lifestyle modifications today in depth, and Heather [name] will continue to work on decreasing saturated fats such as fatty red meat, butter and many fried foods. She will also increase vegetables and lean protein in her diet and continue to work on exercise and weight loss efforts. Heather [name] will continue on Crestor and will have labs drawn today. Heather [name] agrees to follow-up with our clinic in 3-4 weeks.  Vitamin D Deficiency Heather [name] was informed that low Vitamin D levels contributes to fatigue and are associated with obesity, breast, and colon cancer. She agrees to continue taking Vit D and will have labs drawn today. She was informed of the risk of over-replacement of Vitamin D and agrees to not increase her dose unless she discusses this with [name] first. Heather [name] agrees to follow-up with our clinic in 3-4 weeks.  Obesity Heather [name] is currently in the action stage of change. As such, her goal is to continue with weight loss efforts. She has agreed to follow the Category 2 plan or low carb plan. Heather [name] has been instructed to continue her  elliptical 5 times per week. We discussed the following Behavioral Modification Strategies today: increasing lean protein intake and decreasing simple carbohydrates.  Heather [name] has agreed to follow-up with our clinic in 3-4 weeks. She was informed of the importance of frequent follow up visits to maximize her success with intensive lifestyle modifications for her multiple health conditions.  ALLERGIES: Allergies  Allergen Reactions  . Aspirin   . Coconut Oil   . Lisinopril     REACTION: cough  . Metoprolol Itching  . Peanut-Containing Drug Products   . Penicillins     REACTION: rash  . Prednisone   . Strawberry Extract     MEDICATIONS: Current Outpatient Medications on File Prior to Visit  Medication Sig Dispense Refill  . acetaminophen (TYLENOL) 500 MG tablet Take 650 mg by mouth 3 (three) times daily as needed for pain.     . Calcium Carbonate-Vitamin D (CALCIUM 600 + D PO) Take 2 tablets by mouth daily.      . clopidogrel (PLAVIX) 75 MG tablet Take 1 tablet (75 mg total) by mouth daily. 90 tablet 3  . Ferrous Sulfate (IRON) 325 (65 FE) MG TABS Take 1 tablet by mouth daily.      . fexofenadine (ALLEGRA) 180 MG tablet Take 180 mg by mouth daily.    . furosemide (LASIX) 40 MG tablet Take 1 tablet (40 mg total) by mouth daily. 90 tablet 3  . gabapentin (NEURONTIN) 100 MG capsule Take 2 capsules (200 mg total) by mouth at bedtime. 180 capsule 3  . glucosamine-chondroitin 500-400 MG tablet Take 1 tablet by mouth 2 (two) times daily.      [name] losartan (COZAAR) 25 MG tablet TAKE 1 TABLET EVERY DAY    . metFORMIN (GLUCOPHAGE-XR) 500 MG 24 hr tablet Take 2 tablets (1,000 mg total) by mouth at bedtime. 180 tablet 3  . modafinil (PROVIGIL) 200 MG tablet modafinil 200 mg tablet    . Multiple Vitamin (MULTIVITAMIN) tablet Take 1 tablet by mouth daily.      . naproxen sodium (ANAPROX) 220 MG tablet Take 220 mg by mouth as needed.    . Omega-3 Fatty Acids (FISH OIL) 1000 MG CAPS Take 1 capsule by  mouth daily.    [name] omeprazole (PRILOSEC) 20 MG capsule Take 20 mg by mouth daily as needed.     . potassium chloride SA (KLOR-CON M20) 20 MEQ tablet Take 1 tablet (20 mEq total) by mouth daily. 90 tablet 3  . pyridOXINE (VITAMIN B-6) 100 MG tablet Take 200 mg by mouth daily.    . rosuvastatin (CRESTOR) 40 MG tablet Take 1 tablet (40 mg total) by mouth daily. At night 90 tablet 3  . SF 5000 PLUS 1.1 % CREA dental cream   0  . SUMAtriptan (IMITREX) 20 MG/ACT nasal spray sumatriptan 20 mg/actuation nasal spray    . topiramate (TOPAMAX) 100 MG tablet TAKE 1 TABLET EVERY DAY 90 tablet 3  . Turmeric 500 MG TABS Take 2 tablets by mouth daily.    [name] venlafaxine XR (EFFEXOR XR) 37.5 MG 24 hr capsule Take 1 capsule (37.5 mg total) by mouth daily with breakfast. 90 capsule 1  . vitamin E 200 UNIT capsule Take 200 Units by mouth daily.    [name]  Zoster Vaccine Live, PF, (ZOSTAVAX) 32440 UNT/0.65ML injection Zostavax (PF) 19,400 unit/0.65 mL subcutaneous suspension     Current Facility-Administered Medications on File Prior to Visit  Medication Dose Route Frequency Provider Last Rate Last Dose  . 0.9 %  sodium chloride infusion  500 mL Intravenous Continuous Nandigam, Venia Minks, MD        PAST MEDICAL HISTORY: Past Medical History:  Diagnosis Date  . Abdominal hernia   . Abnormal Pap smear   . ALLERGIC RHINITIS 10/22/2006  . Allergy   . ANEMIA 12/18/2008  . Arthritis    scoliosis moderate deg changes lumbar Xray 11/04/17   . ASYMPTOMATIC POSTMENOPAUSAL STATUS 11/22/2007  . Back pain   . CAD (coronary artery disease)    in LAD  . Complication of anesthesia   . Degenerative arthritis   . DIABETES MELLITUS, TYPE II 01/13/2007  . Dyslipidemia   . Fibroid   . Frequent headaches   . GERD 07/20/2007  . GOITER, MULTINODULAR 07/20/2007  . Headache(784.0) 07/20/2007  . HEARING LOSS 11/22/2007  . Hiatal hernia   . Hiatal hernia    large  . History of chicken pox   . Hx of colposcopy with cervical biopsy   .  HYPERCHOLESTEROLEMIA 01/13/2007  . Hyperglycemia   . HYPERTENSION 10/22/2006  . Lung nodule    unchanged since 05/26/13   . Migraines   . NASH (nonalcoholic steatohepatitis)   . Nocturnal hypoxemia 02/17/2013  . Obesity   . Obstructive sleep apnea   . OSA on CPAP    per neurology  . OSTEOARTHRITIS 10/22/2006  . Other chronic nonalcoholic liver disease 03/04/7251  . Sedimentation rate elevation   . Sleep apnea    on cpap  . Urine incontinence   . UTI (urinary tract infection)     PAST SURGICAL HISTORY: Past Surgical History:  Procedure Laterality Date  . BREAST SURGERY  1984   Breast reduction b/l   . CATARACT EXTRACTION, BILATERAL    . DEXA  08/2005  . DILATION AND CURETTAGE OF UTERUS    . ELECTROCARDIOGRAM  10/15/2006  . ESOPHAGOGASTRODUODENOSCOPY  12/08/2005  . FOOT SURGERY     hammertoe and bunion 05/2017   . Stress Cardiolite  10/21/2005  . sweat gland removal    . WISDOM TOOTH EXTRACTION      SOCIAL HISTORY: Social History   Tobacco Use  . Smoking status: Former Smoker    Packs/day: 2.00    Years: 20.00    Pack years: 40.00    Types: Cigarettes    Last attempt to quit: 03/03/1979    Years since quitting: 39.1  . Smokeless tobacco: Never Used  Substance Use Topics  . Alcohol use: Yes    Comment: rare  . Drug use: No    FAMILY HISTORY: Family History  Problem Relation Age of Onset  . Asthma Mother   . Depression Mother   . Bipolar disorder Mother   . Dementia Mother   . Arthritis Mother   . Breast cancer Mother        primary  . Colon cancer Mother        mets from breast  . Cancer Mother        colon  . Hypertension Father   . Migraines Father   . Cancer Maternal Aunt        ?  [name] Heart disease Maternal Grandmother   . Cancer Maternal Grandfather        stomach ?   ROS: Review  of Systems  Constitutional: Positive for weight loss.  Cardiovascular: Negative for chest pain.  Gastrointestinal: Negative for nausea and vomiting.  Musculoskeletal:  Negative for myalgias.  Endo/Heme/Allergies:       Negative for hypoglycemia.   PHYSICAL EXAM: Blood pressure 113/70, pulse 60, height 5' 5"  (1.651 m), weight 194 lb (88 kg), SpO2 97 %. Body mass index is 32.28 kg/m. Physical Exam Vitals signs reviewed.  Constitutional:      Appearance: Normal appearance. She is obese.  Cardiovascular:     Rate and Rhythm: Normal rate.     Pulses: Normal pulses.  Pulmonary:     Effort: Pulmonary effort is normal.     Breath sounds: Normal breath sounds.  Musculoskeletal: Normal range of motion.  Skin:    General: Skin is warm and dry.  Neurological:     Mental Status: She is alert and oriented to person, place, and time.  Psychiatric:        Behavior: Behavior normal.   RECENT LABS AND TESTS: BMET    Component Value Date/Time   NA 142 12/01/2017 1146   K 3.9 12/01/2017 1146   CL 103 12/01/2017 1146   CO2 27 12/01/2017 1146   GLUCOSE 83 12/01/2017 1146   GLUCOSE 116 (H) 03/05/2016 0958   BUN 10 12/01/2017 1146   CREATININE 0.68 12/01/2017 1146   CREATININE 0.81 12/28/2011 0853   CALCIUM 9.2 12/01/2017 1146   GFRNONAA 88 12/01/2017 1146   GFRAA 102 12/01/2017 1146   Lab Results  Component Value Date   HGBA1C 6.0 (H) 12/01/2017   HGBA1C 5.7 (H) 07/29/2017   HGBA1C 5.9 03/15/2017   HGBA1C 6.3 (H) 12/23/2016   HGBA1C 6.3 09/08/2016   Lab Results  Component Value Date   INSULIN 5.6 12/01/2017   INSULIN 6.8 07/29/2017   INSULIN 20.9 12/23/2016   CBC    Component Value Date/Time   WBC 4.0 12/01/2017 1146   WBC 7.2 03/05/2016 0958   RBC 4.35 12/01/2017 1146   RBC 4.46 03/05/2016 0958   HGB 12.4 12/01/2017 1146   HCT 39.5 12/01/2017 1146   PLT 300.0 03/05/2016 0958   MCV 91 12/01/2017 1146   MCH 28.5 12/01/2017 1146   MCH 29.4 12/28/2011 0853   MCHC 31.4 (L) 12/01/2017 1146   MCHC 32.9 03/05/2016 0958   RDW 16.2 (H) 12/01/2017 1146   LYMPHSABS 1.2 12/01/2017 1146   MONOABS 0.4 03/05/2016 0958   EOSABS 0.1 12/01/2017  1146   BASOSABS 0.0 12/01/2017 1146   Iron/TIBC/Ferritin/ %Sat    Component Value Date/Time   IRON 43 (L) 04/26/2018 1602   IRON 38 12/23/2016 0956   TIBC 249 (L) 04/26/2018 1602   TIBC 271 12/23/2016 0956   FERRITIN 60 04/26/2018 1602   FERRITIN 40 12/23/2016 0956   IRONPCTSAT 17 04/26/2018 1602   Lipid Panel     Component Value Date/Time   CHOL 178 12/01/2017 1146   TRIG 66 12/01/2017 1146   HDL 76 12/01/2017 1146   CHOLHDL 3 03/05/2016 0958   VLDL 26.0 03/05/2016 0958   LDLCALC 89 12/01/2017 1146   Hepatic Function Panel     Component Value Date/Time   PROT 7.0 12/01/2017 1146   ALBUMIN 4.1 12/01/2017 1146   AST 20 12/01/2017 1146   ALT 20 12/01/2017 1146   ALKPHOS 78 12/01/2017 1146   BILITOT <0.2 12/01/2017 1146   BILIDIR 0.1 02/15/2015 1010   IBILI 0.2 12/28/2011 0853      Component Value Date/Time   TSH  1.170 12/23/2016 0956   TSH 1.76 03/05/2016 0958   TSH 1.86 02/15/2015 1010    Ref. Range 12/01/2017 11:46  Vitamin D, 25-Hydroxy Latest Ref Range: 30.0 - 100.0 ng/mL 34.2   OBESITY BEHAVIORAL INTERVENTION VISIT  Today's visit was #24   Starting weight: 268 lbs Starting date: 12/23/2016 Today's weight: 194 lbs Today's date: 04/27/2018 Total lbs lost to date: 4 At least 15 minutes were spent on discussing the following behavioral intervention visit.    04/27/2018  Height 5' 5"  (1.651 m)  Weight 194 lb (88 kg)  BMI (Calculated) 32.28  BLOOD PRESSURE - SYSTOLIC 412  BLOOD PRESSURE - DIASTOLIC 70   Body Fat % 87.8 %  Total Body Water (lbs) 83.8 lbs   ASK: We discussed the diagnosis of obesity with Heather [name] today and Heather [name] agreed to give [name] permission to discuss obesity behavioral modification therapy today.  ASSESS: Heather [name] has the diagnosis of obesity and her BMI today is 32.28. Heather [name] is in the action stage of change.   ADVISE: Xzaria was educated on the multiple health risks of obesity as well as the benefit of weight loss to improve her  health. She was advised of the need for long term treatment and the importance of lifestyle modifications to improve her current health and to decrease her risk of future health problems.  AGREE: Multiple dietary modification options and treatment options were discussed and  [name] agreed to follow the recommendations documented in the above note.  ARRANGE: Sherice was educated on the importance of frequent visits to treat obesity as outlined per CMS and USPSTF guidelines and agreed to schedule her next follow up appointment today.  I, Michaelene Song, am acting as Location manager for [name], MD  I have reviewed the above documentation for accuracy and completeness, and I agree with the above. -[name], MD

## 2018-04-28 LAB — COMPREHENSIVE METABOLIC PANEL
ALBUMIN: 4 g/dL (ref 3.7–4.7)
ALT: 12 IU/L (ref 0–32)
AST: 18 IU/L (ref 0–40)
Albumin/Globulin Ratio: 1.3 (ref 1.2–2.2)
Alkaline Phosphatase: 61 IU/L (ref 39–117)
BUN/Creatinine Ratio: 19 (ref 12–28)
BUN: 13 mg/dL (ref 8–27)
Bilirubin Total: <0.2 mg/dL (ref 0.0–1.2)
CALCIUM: 9.2 mg/dL (ref 8.7–10.3)
CHLORIDE: 104 mmol/L (ref 96–106)
CO2: 22 mmol/L (ref 20–29)
Creatinine, Ser: 0.67 mg/dL (ref 0.57–1.00)
GFR calc Af Amer: 102 mL/min/{1.73_m2} (ref >59)
GFR calc non Af Amer: 89 mL/min/{1.73_m2} (ref >59)
GLOBULIN, TOTAL: 3 g/dL (ref 1.5–4.5)
Glucose: 78 mg/dL (ref 65–99)
Potassium: 4 mmol/L (ref 3.5–5.2)
Sodium: 141 mmol/L (ref 134–144)
TOTAL PROTEIN: 7 g/dL (ref 6.0–8.5)

## 2018-04-28 LAB — HEMOGLOBIN A1C
ESTIMATED AVERAGE GLUCOSE: 114 mg/dL
HEMOGLOBIN A1C: 5.6 % (ref 4.8–5.6)

## 2018-04-28 LAB — LIPID PANEL WITH LDL/HDL RATIO
CHOLESTEROL TOTAL: 207 mg/dL — AB (ref 100–199)
HDL: 72 mg/dL (ref >39)
LDL Calculated: 122 mg/dL — ABNORMAL HIGH (ref 0–99)
LDl/HDL Ratio: 1.7 ratio (ref 0.0–3.2)
TRIGLYCERIDES: 63 mg/dL (ref 0–149)
VLDL Cholesterol Cal: 13 mg/dL (ref 5–40)

## 2018-04-28 LAB — TSH: TSH: 1.41 u[IU]/mL (ref 0.35–4.50)

## 2018-04-28 LAB — VITAMIN B12: Vitamin B-12: 1420 pg/mL — ABNORMAL HIGH (ref 211–911)

## 2018-04-28 LAB — T4, FREE: FREE T4: 0.7 ng/dL (ref 0.60–1.60)

## 2018-04-28 LAB — VITAMIN D 25 HYDROXY (VIT D DEFICIENCY, FRACTURES): Vit D, 25-Hydroxy: 36.9 ng/mL (ref 30.0–100.0)

## 2018-04-28 LAB — INSULIN, RANDOM: INSULIN: 3.6 u[IU]/mL (ref 2.6–24.9)

## 2018-05-01 ENCOUNTER — Encounter: Payer: Self-pay | Admitting: Internal Medicine

## 2018-05-02 NOTE — Addendum Note (Signed)
Addended by: Orland Mustard on: 05/02/2018 03:57 PM   Modules accepted: Orders

## 2018-05-03 ENCOUNTER — Ambulatory Visit (HOSPITAL_COMMUNITY): Payer: Medicare Other

## 2018-05-09 ENCOUNTER — Ambulatory Visit (HOSPITAL_COMMUNITY): Payer: Medicare Other

## 2018-05-10 ENCOUNTER — Ambulatory Visit (HOSPITAL_COMMUNITY)
Admission: RE | Admit: 2018-05-10 | Discharge: 2018-05-10 | Disposition: A | Discharge status: Home / Self Care | Payer: Medicare Other | Source: Ambulatory Visit | Attending: Internal Medicine | Admitting: Internal Medicine

## 2018-05-10 ENCOUNTER — Ambulatory Visit: Payer: Self-pay | Admitting: Internal Medicine

## 2018-05-10 DIAGNOSIS — R599 Enlarged lymph nodes, unspecified: Secondary | ICD-10-CM | POA: Insufficient documentation

## 2018-05-10 DIAGNOSIS — R600 Localized edema: Secondary | ICD-10-CM | POA: Insufficient documentation

## 2018-05-10 DIAGNOSIS — R2241 Localized swelling, mass and lump, right lower limb: Secondary | ICD-10-CM | POA: Diagnosis not present

## 2018-05-10 DIAGNOSIS — R1907 Generalized intra-abdominal and pelvic swelling, mass and lump: Secondary | ICD-10-CM

## 2018-05-10 NOTE — Telephone Encounter (Signed)
[place] from the Vascular  Lab at [facility] called in a report for the pt.   There is not a DVT.    There is a mass in right thigh they wanted evaluated however the vascular lab is unable to evaluate that.   That would need to go to ultrasound.  I sent these notes to Dr McLean-Scocuzza's  Nurse pool and called the flow coordinator.  I spoke with Fransisco Beau.   I let him know the venous Doppler study was negative for a DVT.    Also that the vascular lab is unable to evaluate the mass in her right thigh.  That would be another ultrasound area to  Do that evaluation.     [name]

## 2018-05-10 NOTE — Progress Notes (Signed)
Right lower extremity venous duplex completed. Preliminary results in Chart review CV Proc. [place] Elier Zellars,RVS 05/10/2018, 1:17 pm

## 2018-05-11 ENCOUNTER — Ambulatory Visit (INDEPENDENT_AMBULATORY_CARE_PROVIDER_SITE_OTHER): Payer: Medicare Other | Admitting: Psychology

## 2018-05-11 ENCOUNTER — Ambulatory Visit (INDEPENDENT_AMBULATORY_CARE_PROVIDER_SITE_OTHER): Payer: Medicare Other | Admitting: Dietician

## 2018-05-12 ENCOUNTER — Ambulatory Visit
Admission: RE | Admit: 2018-05-12 | Discharge: 2018-05-12 | Disposition: A | Discharge status: Home / Self Care | Payer: Medicare Other | Source: Ambulatory Visit | Attending: Internal Medicine | Admitting: Internal Medicine

## 2018-05-12 DIAGNOSIS — R2241 Localized swelling, mass and lump, right lower limb: Secondary | ICD-10-CM

## 2018-05-12 DIAGNOSIS — Z1231 Encounter for screening mammogram for malignant neoplasm of breast: Secondary | ICD-10-CM | POA: Diagnosis not present

## 2018-05-12 DIAGNOSIS — R599 Enlarged lymph nodes, unspecified: Secondary | ICD-10-CM

## 2018-05-12 DIAGNOSIS — R1907 Generalized intra-abdominal and pelvic swelling, mass and lump: Secondary | ICD-10-CM | POA: Diagnosis not present

## 2018-05-12 DIAGNOSIS — R600 Localized edema: Secondary | ICD-10-CM

## 2018-05-12 MED ORDER — IOPAMIDOL (ISOVUE-300) INJECTION 61%
100.0000 mL | Freq: Once | INTRAVENOUS | Status: AC | PRN
  Administered 2018-05-12: 100 mL via INTRAVENOUS

## 2018-05-13 ENCOUNTER — Ambulatory Visit: Payer: Self-pay

## 2018-05-13 NOTE — Telephone Encounter (Signed)
If she is having severe abdominal pain, abdomen is hard and distended and having a lot of diarrhea she absolutely must go to the emergency room for evaluation.  The symptoms sound very severe and extremely concerning.

## 2018-05-13 NOTE — Telephone Encounter (Addendum)
Pt's husband called to report wife was having a "reaction to the contrast" from CT contrast due to diarrhea. Spoke with pt and pt c/o  c/o severe abdomen pain "12." Abdomen hard and distended. Pt stated she has a peach sized, "hard" distension (hernia) that is above the navel. Pt stated it came on gradually after episode of diarrhea. Pt stated abdominal pain extends to the sides of her abdomen but pt cannot say which side. Pt having dry heaving.  Husband and pt advised to go to the nearest ED now.  Pt and Husband refused. Pt stated her doctor in [place] said to come to [place] for emergencies and not to go to local ED. Pt advised that this is a emergency needs evaluation and treatment urgently.  Pt stated that she will call her doctor in [place] and will call back for any further issues.  Called [facility] and informed her of triage and refusal to go to a local ED. Reason for Disposition . [1] SEVERE pain AND [2] age > 42  Answer Assessment - Initial Assessment Questions 1. LOCATION: "Where does it hurt?"      Abdomen area above belly button pain radiates out from peach size  2. RADIATION: "Does the pain shoot anywhere else?" (e.g., chest, back)     To the side of her abdomen pt could not indicate what side.  3. ONSET: "When did the pain begin?" (e.g., minutes, hours or days ago)      Last night 4. SUDDEN: "Gradual or sudden onset?"     Gradual  5. PATTERN "Does the pain come and go, or is it constant?"    - If constant: "Is it getting better, staying the same, or worsening?"      (Note: Constant means the pain never goes away completely; most serious pain is constant and it progresses)     - If intermittent: "How long does it last?" "Do you have pain now?"     (Note: Intermittent means the pain goes away completely between bouts)     constant 6. SEVERITY: "How bad is the pain?"  (e.g., Scale 1-10; mild, moderate, or severe)   - MILD (1-3): doesn't interfere with normal activities, abdomen soft and not tender  to touch    - MODERATE (4-7): interferes with normal activities or awakens from sleep, tender to touch    - SEVERE (8-10): excruciating pain, doubled over, unable to do any normal activities      Severe 12 7. RECURRENT SYMPTOM: "Have you ever had this type of abdominal pain before?" If so, ask: "When was the last time?" and "What happened that time?"      no 8. CAUSE: "What do you think is causing the abdominal pain?"     hernia 9. RELIEVING/AGGRAVATING FACTORS: "What makes it better or worse?" (e.g., movement, antacids, bowel movement)     nothing 10. OTHER SYMPTOMS: "Has there been any vomiting, diarrhea, constipation, or urine problems?"       Diarrhea, dry heaving, Last BM firm 1 hour ago 11. PREGNANCY: "Is there any chance you are pregnant?" "When was your last menstrual period?"       n/a  Protocols used: ABDOMINAL PAIN - [facility]

## 2018-05-13 NOTE — Telephone Encounter (Signed)
Called Pt and she stated that she feels better the pain went away and the diarrhea. Pt also stated she spoke to her surgeon as well, and she is okay now.

## 2018-05-13 NOTE — Telephone Encounter (Signed)
Spoke with Triage nurse on the phone. Patients husband is adamantly refusing to take pt to ED

## 2018-05-13 NOTE — Telephone Encounter (Signed)
Pt husband called [place] Pt's husband called to report wife was having a "reaction to the contrast" from CT contrast due to diarrhea. Spoke with pt and pt c/o  c/o severe abdomen pain "12." Abdomen hard and distended. Pt stated she has a peach sized, "hard" distension (hernia) that is above the navel. Pt stated it came on gradually after episode of diarrhea. Pt stated abdominal pain extends to the sides of her abdomen but pt cannot say which side. Pt having dry heaving.  Husband and pt advised to go to the nearest ED now.  Pt and Husband refused. Pt stated her doctor in [place] said to come to [place] for emergencies and not to go to local ED. Pt advised that this is a emergency needs evaluation and treatment urgently.  Pt stated that she will call her doctor in [place] and will call back for any further issues.  Called [facility] and informed her of triage and refusal to go to a local ED

## 2018-05-13 NOTE — Telephone Encounter (Signed)
OK noted

## 2018-05-14 ENCOUNTER — Other Ambulatory Visit: Payer: Self-pay | Admitting: Internal Medicine

## 2018-05-16 ENCOUNTER — Other Ambulatory Visit: Payer: Self-pay | Admitting: Internal Medicine

## 2018-05-16 ENCOUNTER — Encounter: Payer: Self-pay | Admitting: Internal Medicine

## 2018-05-16 DIAGNOSIS — R2241 Localized swelling, mass and lump, right lower limb: Secondary | ICD-10-CM

## 2018-05-17 ENCOUNTER — Other Ambulatory Visit: Payer: Self-pay

## 2018-05-17 ENCOUNTER — Ambulatory Visit (INDEPENDENT_AMBULATORY_CARE_PROVIDER_SITE_OTHER): Payer: Medicare Other | Admitting: Podiatry

## 2018-05-17 ENCOUNTER — Encounter: Payer: Self-pay | Admitting: Podiatry

## 2018-05-17 DIAGNOSIS — B351 Tinea unguium: Secondary | ICD-10-CM

## 2018-05-17 DIAGNOSIS — M79676 Pain in unspecified toe(s): Secondary | ICD-10-CM | POA: Diagnosis not present

## 2018-05-17 DIAGNOSIS — Q828 Other specified congenital malformations of skin: Secondary | ICD-10-CM

## 2018-05-17 NOTE — Progress Notes (Signed)
She presents today chief complaint of painful toenails and calluses.  Objective: Vital signs are stable she is alert and oriented x3.  Pulses are palpable.  Toenails are thick yellow dystrophic and painful.  Reactive hyperkeratosis is noted.  Assessment: Pain limb secondary to onychomycosis hallux valgus deformity and reactive hyperkeratotic tissue.  Plan: Debridement of all hyperkeratotic tissue debridement of nails 1 through 5 bilateral.  Follow-up with her in the future for nail debridement as well as surgical consult.

## 2018-05-19 ENCOUNTER — Other Ambulatory Visit: Payer: Self-pay

## 2018-05-19 ENCOUNTER — Encounter (INDEPENDENT_AMBULATORY_CARE_PROVIDER_SITE_OTHER): Payer: Self-pay | Admitting: Family Medicine

## 2018-05-19 ENCOUNTER — Ambulatory Visit (INDEPENDENT_AMBULATORY_CARE_PROVIDER_SITE_OTHER): Payer: Medicare Other | Admitting: Family Medicine

## 2018-05-19 ENCOUNTER — Telehealth: Payer: Self-pay | Admitting: Neurology

## 2018-05-19 VITALS — BP 104/68 | HR 84 | Ht 65.0 in | Wt 195.0 lb

## 2018-05-19 DIAGNOSIS — E669 Obesity, unspecified: Secondary | ICD-10-CM | POA: Diagnosis not present

## 2018-05-19 DIAGNOSIS — Z6832 Body mass index (BMI) 32.0-32.9, adult: Secondary | ICD-10-CM | POA: Diagnosis not present

## 2018-05-19 DIAGNOSIS — E66811 Obesity, class 1: Secondary | ICD-10-CM

## 2018-05-19 DIAGNOSIS — F418 Other specified anxiety disorders: Secondary | ICD-10-CM | POA: Diagnosis not present

## 2018-05-19 NOTE — Telephone Encounter (Signed)
Called and spoke with the patient and offered her the availability to come in or for [name] to offer phone call visit. Patient has accepted the option of completing the ov on the phone. She would like to be called at the home number [phone]. Advised the patient to be near and by the phone between 1&2. Pt verbalized understanding. Advised the patient if she has the capability to have her CPAP machine download read at [facility] and gave personal fax number for [facility] to fax the download to me. This would be helpful in charting for compliance reasons. Pt verbalized understanding.

## 2018-05-19 NOTE — Progress Notes (Signed)
Office: [phone]  /  Fax: [phone]   HPI:   Chief Complaint: OBESITY Heather [name] is here to discuss her progress with her obesity treatment plan. She is on the Category 2 plan and is following her eating plan approximately 50 % of the time. She states she is exercising 0 minutes 0 times per week. Heather [name] has been struggling to follow a structured plan. She has had some health issues and her anxiety has flared up. She feels very trapped during this social isolation period.  Her weight is 195 lb (88.5 kg) today and has gained 1 lbs since her last visit. She has lost 73 lbs since starting treatment with [name].  Depression with anxiety  Heather [name] notes her anxiety has worsened in the last week with social isolation from Heather [name] 20. She has not been taking her Effexor lately which is likely contributing. Heather [name] is struggling with emotional eating and using food for comfort to the extent that it is negatively impacting her health. She often snacks when she is not hungry. Heather [name] sometimes feels she is out of control and then feels guilty that she made poor food choices. She has been working on behavior modification techniques to help reduce her emotional eating and has been somewhat successful. She shows no sign of suicidal or homicidal ideations.  Depression screen [facility] 2/9 01/05/2018 12/23/2016  Decreased Interest 0 2  Down, Depressed, Hopeless 0 1  PHQ - 2 Score 0 3  Altered sleeping - 1  Tired, decreased energy - 3  Change in appetite - 1  Feeling bad or failure about yourself  - 0  Trouble concentrating - 0  Moving slowly or fidgety/restless - 2  Suicidal thoughts - 0  PHQ-9 Score - 10  Difficult doing work/chores - Somewhat difficult  Some recent data might be hidden      ASSESSMENT AND PLAN:  Depression with anxiety  Class 1 obesity with serious comorbidity and body mass index (BMI) of 32.0 to 32.9 in adult, unspecified obesity type  PLAN:  Depression with anxiety We discussed behavior  modification techniques today to help Evgenia deal with her emotional eating, anxiety and depression. Rennae has agreed to restart Effexor and follow up with our clinic in 3 weeks.  I spent > than 50% of the 25 minute visit on counseling as documented in the note.   Obesity Heather [name] is currently in the action stage of change. As such, her goal is to continue with weight loss efforts She has agreed to follow the Category 2 plan Heather [name] has been instructed to work up to a goal of 150 minutes of combined cardio and strengthening exercise per week for weight loss and overall health benefits. We discussed the following Behavioral Modification Strategies today: increasing lean protein intake, decreasing simple carbohydrates , work on meal planning and easy cooking plans, emotional eating strategies, ways to avoid boredom eating, ways to avoid night time snacking, Keeping healthy foods in the home and better snacking choices  Heather [name] has agreed to follow up with our clinic in 3 weeks. She was informed of the importance of frequent follow up visits to maximize her success with intensive lifestyle modifications for her multiple health conditions.  ALLERGIES: Allergies  Allergen Reactions  . Aspirin   . Coconut Oil   . Lisinopril     REACTION: cough  . Metoprolol Itching  . Peanut-Containing Drug Products   . Penicillins     REACTION: rash  . Prednisone   . Strawberry Extract  MEDICATIONS: Current Outpatient Medications on File Prior to Visit  Medication Sig Dispense Refill  . acetaminophen (TYLENOL) 500 MG tablet Take 650 mg by mouth 3 (three) times daily as needed for pain.     . Calcium Carbonate-Vitamin D (CALCIUM 600 + D PO) Take 2 tablets by mouth daily.      . clopidogrel (PLAVIX) 75 MG tablet Take 1 tablet (75 mg total) by mouth daily. 90 tablet 3  . Ferrous Sulfate (IRON) 325 (65 FE) MG TABS Take 1 tablet by mouth daily.      . fexofenadine (ALLEGRA) 180 MG tablet Take 180 mg by mouth  daily.    . furosemide (LASIX) 40 MG tablet Take 1 tablet (40 mg total) by mouth daily. 90 tablet 3  . gabapentin (NEURONTIN) 100 MG capsule Take 2 capsules (200 mg total) by mouth at bedtime. 180 capsule 3  . glucosamine-chondroitin 500-400 MG tablet Take 1 tablet by mouth 2 (two) times daily.      [name] losartan (COZAAR) 25 MG tablet TAKE 1 TABLET EVERY DAY    . metFORMIN (GLUCOPHAGE-XR) 500 MG 24 hr tablet Take 2 tablets (1,000 mg total) by mouth at bedtime. 180 tablet 3  . modafinil (PROVIGIL) 200 MG tablet modafinil 200 mg tablet    . Multiple Vitamin (MULTIVITAMIN) tablet Take 1 tablet by mouth daily.      . naproxen sodium (ANAPROX) 220 MG tablet Take 220 mg by mouth as needed.    . Omega-3 Fatty Acids (FISH OIL) 1000 MG CAPS Take 1 capsule by mouth daily.    [name] omeprazole (PRILOSEC) 20 MG capsule Take 20 mg by mouth daily as needed.     . potassium chloride SA (KLOR-CON M20) 20 MEQ tablet Take 1 tablet (20 mEq total) by mouth daily. 90 tablet 3  . pyridOXINE (VITAMIN B-6) 100 MG tablet Take 200 mg by mouth daily.    . rosuvastatin (CRESTOR) 40 MG tablet Take 1 tablet (40 mg total) by mouth daily. At night 90 tablet 3  . SF 5000 PLUS 1.1 % CREA dental cream   0  . SUMAtriptan (IMITREX) 20 MG/ACT nasal spray sumatriptan 20 mg/actuation nasal spray    . topiramate (TOPAMAX) 100 MG tablet TAKE 1 TABLET EVERY DAY 90 tablet 3  . Turmeric 500 MG TABS Take 2 tablets by mouth daily.    [name] venlafaxine XR (EFFEXOR XR) 37.5 MG 24 hr capsule Take 1 capsule (37.5 mg total) by mouth daily with breakfast. 90 capsule 1  . vitamin E 200 UNIT capsule Take 200 Units by mouth daily.    [name] Zoster Vaccine Live, PF, (ZOSTAVAX) 37902 UNT/0.65ML injection Zostavax (PF) 19,400 unit/0.65 mL subcutaneous suspension     Current Facility-Administered Medications on File Prior to Visit  Medication Dose Route Frequency Provider Last Rate Last Dose  . 0.9 %  sodium chloride infusion  500 mL Intravenous Continuous Nandigam,  Venia Minks, MD        PAST MEDICAL HISTORY: Past Medical History:  Diagnosis Date  . Abdominal hernia   . Abnormal Pap smear   . ALLERGIC RHINITIS 10/22/2006  . Allergy   . ANEMIA 12/18/2008  . Arthritis    scoliosis moderate deg changes lumbar Xray 11/04/17   . ASYMPTOMATIC POSTMENOPAUSAL STATUS 11/22/2007  . Back pain   . CAD (coronary artery disease)    in LAD  . Complication of anesthesia   . Degenerative arthritis   . DIABETES MELLITUS, TYPE II 01/13/2007  . Dyslipidemia   .  Fibroid   . Frequent headaches   . GERD 07/20/2007  . GOITER, MULTINODULAR 07/20/2007  . Headache(784.0) 07/20/2007  . HEARING LOSS 11/22/2007  . Hiatal hernia   . Hiatal hernia    large  . History of chicken pox   . Hx of colposcopy with cervical biopsy   . HYPERCHOLESTEROLEMIA 01/13/2007  . Hyperglycemia   . HYPERTENSION 10/22/2006  . Lung nodule    unchanged since 05/26/13   . Migraines   . NASH (nonalcoholic steatohepatitis)   . Nocturnal hypoxemia 02/17/2013  . Obesity   . Obstructive sleep apnea   . OSA on CPAP    per neurology  . OSTEOARTHRITIS 10/22/2006  . Other chronic nonalcoholic liver disease 8/46/6599  . Sedimentation rate elevation   . Sleep apnea    on cpap  . Urine incontinence   . UTI (urinary tract infection)     PAST SURGICAL HISTORY: Past Surgical History:  Procedure Laterality Date  . BREAST SURGERY  1984   Breast reduction b/l   . CATARACT EXTRACTION, BILATERAL    . DEXA  08/2005  . DILATION AND CURETTAGE OF UTERUS    . ELECTROCARDIOGRAM  10/15/2006  . ESOPHAGOGASTRODUODENOSCOPY  12/08/2005  . FOOT SURGERY     hammertoe and bunion 05/2017   . Stress Cardiolite  10/21/2005  . sweat gland removal    . WISDOM TOOTH EXTRACTION      SOCIAL HISTORY: Social History   Tobacco Use  . Smoking status: Former Smoker    Packs/day: 2.00    Years: 20.00    Pack years: 40.00    Types: Cigarettes    Last attempt to quit: 03/03/1979    Years since quitting: 39.2  .  Smokeless tobacco: Never Used  Substance Use Topics  . Alcohol use: Yes    Comment: rare  . Drug use: No    FAMILY HISTORY: Family History  Problem Relation Age of Onset  . Asthma Mother   . Depression Mother   . Bipolar disorder Mother   . Dementia Mother   . Arthritis Mother   . Breast cancer Mother        primary  . Colon cancer Mother        mets from breast  . Cancer Mother        colon  . Hypertension Father   . Migraines Father   . Cancer Maternal Aunt        ?  [name] Heart disease Maternal Grandmother   . Cancer Maternal Grandfather        stomach ?    ROS: Review of Systems  Constitutional: Negative for weight loss.  Psychiatric/Behavioral: Positive for depression. Negative for suicidal ideas. The patient is nervous/anxious.        Negative for homicidal ideation    PHYSICAL EXAM: Blood pressure 104/68, pulse 84, height 5' 5"  (1.651 m), weight 195 lb (88.5 kg), SpO2 100 %. Body mass index is 32.45 kg/m. Physical Exam Vitals signs reviewed.  Constitutional:      Appearance: Normal appearance. She is obese.  Cardiovascular:     Rate and Rhythm: Normal rate.     Pulses: Normal pulses.  Pulmonary:     Effort: Pulmonary effort is normal.  Musculoskeletal: Normal range of motion.  Skin:    General: Skin is warm and dry.  Neurological:     Mental Status: She is alert and oriented to person, place, and time.  Psychiatric:        Mood  and Affect: Mood normal.        Behavior: Behavior normal.     RECENT LABS AND TESTS: BMET    Component Value Date/Time   NA 141 04/27/2018 1219   K 4.0 04/27/2018 1219   CL 104 04/27/2018 1219   CO2 22 04/27/2018 1219   GLUCOSE 78 04/27/2018 1219   GLUCOSE 116 (H) 03/05/2016 0958   BUN 13 04/27/2018 1219   CREATININE 0.67 04/27/2018 1219   CREATININE 0.81 12/28/2011 0853   CALCIUM 9.2 04/27/2018 1219   GFRNONAA 89 04/27/2018 1219   GFRAA 102 04/27/2018 1219   Lab Results  Component Value Date   HGBA1C 5.6  04/27/2018   HGBA1C 6.0 (H) 12/01/2017   HGBA1C 5.7 (H) 07/29/2017   HGBA1C 5.9 03/15/2017   HGBA1C 6.3 (H) 12/23/2016   Lab Results  Component Value Date   INSULIN 3.6 04/27/2018   INSULIN 5.6 12/01/2017   INSULIN 6.8 07/29/2017   INSULIN 20.9 12/23/2016   CBC    Component Value Date/Time   WBC 4.5 04/26/2018 1602   RBC 4.19 04/26/2018 1602   HGB 12.9 04/26/2018 1602   HGB 12.4 12/01/2017 1146   HCT 38.3 04/26/2018 1602   HCT 39.5 12/01/2017 1146   PLT 248.0 04/26/2018 1602   MCV 91.2 04/26/2018 1602   MCV 91 12/01/2017 1146   MCH 28.5 12/01/2017 1146   MCH 29.4 12/28/2011 0853   MCHC 33.7 04/26/2018 1602   RDW 16.7 (H) 04/26/2018 1602   RDW 16.2 (H) 12/01/2017 1146   LYMPHSABS 1.3 04/26/2018 1602   LYMPHSABS 1.2 12/01/2017 1146   MONOABS 0.4 04/26/2018 1602   EOSABS 0.1 04/26/2018 1602   EOSABS 0.1 12/01/2017 1146   BASOSABS 0.0 04/26/2018 1602   BASOSABS 0.0 12/01/2017 1146   Iron/TIBC/Ferritin/ %Sat    Component Value Date/Time   IRON 43 (L) 04/26/2018 1602   IRON 38 12/23/2016 0956   TIBC 249 (L) 04/26/2018 1602   TIBC 271 12/23/2016 0956   FERRITIN 60 04/26/2018 1602   FERRITIN 40 12/23/2016 0956   IRONPCTSAT 17 04/26/2018 1602   Lipid Panel     Component Value Date/Time   CHOL 207 (H) 04/27/2018 1219   TRIG 63 04/27/2018 1219   HDL 72 04/27/2018 1219   CHOLHDL 3 03/05/2016 0958   VLDL 26.0 03/05/2016 0958   LDLCALC 122 (H) 04/27/2018 1219   Hepatic Function Panel     Component Value Date/Time   PROT 7.0 04/27/2018 1219   ALBUMIN 4.0 04/27/2018 1219   AST 18 04/27/2018 1219   ALT 12 04/27/2018 1219   ALKPHOS 61 04/27/2018 1219   BILITOT <0.2 04/27/2018 1219   BILIDIR 0.1 02/15/2015 1010   IBILI 0.2 12/28/2011 0853      Component Value Date/Time   TSH 1.41 04/26/2018 1602   TSH 1.170 12/23/2016 0956   TSH 1.76 03/05/2016 0958      OBESITY BEHAVIORAL INTERVENTION VISIT  Today's visit was # 25   Starting weight: 268 lbs Starting  date: 12/23/2016 Today's weight : Weight: 195 lb (88.5 kg)  Today's date: 05/19/2018 Total lbs lost to date: 73      05/19/2018  BP 104/68  Pulse 84  SpO2 100 %  Weight 195 lb  Height 5' 5"  (1.651 m)  BMI (Calculated) 32.45   ASK: We discussed the diagnosis of obesity with Cherlynn June today and Sina agreed to give [name] permission to discuss obesity behavioral modification therapy today.  ASSESS: Alizza has the diagnosis  of obesity and her BMI today is 32.45 Azilee is in the action stage of change   ADVISE: Alasha was educated on the multiple health risks of obesity as well as the benefit of weight loss to improve her health. She was advised of the need for long term treatment and the importance of lifestyle modifications to improve her current health and to decrease her risk of future health problems.  AGREE: Multiple dietary modification options and treatment options were discussed and  Mikahla agreed to follow the recommendations documented in the above note.  ARRANGE: Miyanna was educated on the importance of frequent visits to treat obesity as outlined per CMS and USPSTF guidelines and agreed to schedule her next follow up appointment today.  I, Tammy Wysor, am acting as Location manager for [name], MD  I have reviewed the above documentation for accuracy and completeness, and I agree with the above. -[name], MD

## 2018-05-23 ENCOUNTER — Ambulatory Visit: Payer: Medicare Other | Admitting: Adult Health

## 2018-05-24 ENCOUNTER — Telehealth (INDEPENDENT_AMBULATORY_CARE_PROVIDER_SITE_OTHER): Payer: Medicare Other | Admitting: Neurology

## 2018-05-24 ENCOUNTER — Encounter: Payer: Self-pay | Admitting: Neurology

## 2018-05-24 ENCOUNTER — Other Ambulatory Visit: Payer: Self-pay

## 2018-05-24 ENCOUNTER — Encounter (INDEPENDENT_AMBULATORY_CARE_PROVIDER_SITE_OTHER): Payer: Self-pay

## 2018-05-24 DIAGNOSIS — R519 Headache, unspecified: Secondary | ICD-10-CM

## 2018-05-24 DIAGNOSIS — J301 Allergic rhinitis due to pollen: Secondary | ICD-10-CM | POA: Diagnosis not present

## 2018-05-24 DIAGNOSIS — M25472 Effusion, left ankle: Secondary | ICD-10-CM | POA: Diagnosis not present

## 2018-05-24 DIAGNOSIS — M542 Cervicalgia: Secondary | ICD-10-CM | POA: Diagnosis not present

## 2018-05-24 DIAGNOSIS — Z9989 Dependence on other enabling machines and devices: Secondary | ICD-10-CM | POA: Diagnosis not present

## 2018-05-24 DIAGNOSIS — G4733 Obstructive sleep apnea (adult) (pediatric): Secondary | ICD-10-CM | POA: Diagnosis not present

## 2018-05-24 DIAGNOSIS — R51 Headache: Secondary | ICD-10-CM

## 2018-05-24 DIAGNOSIS — M25471 Effusion, right ankle: Secondary | ICD-10-CM

## 2018-05-24 DIAGNOSIS — K7581 Nonalcoholic steatohepatitis (NASH): Secondary | ICD-10-CM

## 2018-05-24 DIAGNOSIS — R0601 Orthopnea: Secondary | ICD-10-CM

## 2018-05-24 DIAGNOSIS — Z9981 Dependence on supplemental oxygen: Secondary | ICD-10-CM | POA: Diagnosis not present

## 2018-05-24 MED ORDER — TOPIRAMATE 100 MG PO TABS: 100.0000 mg | ORAL_TABLET | Freq: Two times a day (BID) | ORAL | 3 refills | Status: DC

## 2018-05-24 NOTE — Progress Notes (Signed)
[facility]  Provider:  Larey Seat, M D  Referring Provider: [name], MD Primary Care Physician:  McLean-Scocuzza, [name], MD     Virtual Visit via Telephone Note  I connected with Heather [name] on 05/24/18 at  1:30 PM EDT by telephone and verified that I am speaking with the correct person using two identifiers.   I discussed the limitations, risks, security and privacy concerns of performing an evaluation and management service by telephone and the availability of in person appointments. I also discussed with the patient that there may be a patient responsible charge related to this service. The patient expressed understanding and agreed to proceed.   History of Present Illness:   Heather [name] is a 73 y.o. female ,seen here as in a tele-revisit on 05-24-2018 for OSA with CPAP.    Addendum 05-25-2018: I have the opportunity to document the patient's CPAP compliance visit a day after her visit with me, the download took place on 19 May 2018 we will fax the results at 11:16 AM on 25 March.  The patient has been using her CPAP 22 out of 30 days but only 10 of these days over 4 hours.  She achieves 0 by a compliance of only 33% and she has not use the CPAP on 8 days, and less than 4 hours on 12 days.  Average user time was 2 hours 50 minutes, median usage is 3 hours 51 minutes, the patient is using an easy breeze BiPAP machine with an inspiratory pressure of 11 and expiratory pressure setting of 7 cm water pressure.  Her residual AHI is excellent at 0.4/h of sleep.  She has very few central events.  There is no significant air leakage in the first half of the months but an increased amount of air leaking in the second half of the months.  This may be related to the age of the mask or the headgear being older.  I would not asked the patient for any changes in the settings but definitely would urge her to become compliant. She sleeps an additional 2-4 hours  daily in her recliner without CPAP. She is due for  A new machine - I hope that we will convince her to use one machine in the recliner during NAP times.    [facility] is her DME- her download did not reach me before today's visit.  The machine is 72 years old. She has less frequent headaches on CPAP but still uses TPM. She remains obese, naps frequently and without using CPAP or oxygen, in a recliner for 2-4 hours in afternoon.   She needs refills of Topiramate tab 100 mg once a day -  To be send as 90 days supply to new pharmacy [facility] [place], [place]   How likely are you to doze in the following situations: 0 = not likely, 1 = slight chance, 2 = moderate chance, 3 = high chance  Sitting and Reading? 3 Watching Television? 3  Sitting inactive in a public place (theater or meeting)? 1 Lying down in the afternoon when circumstances permit? 3 Sitting and talking to someone? 1 Sitting quietly after lunch without alcohol? 2 In a car, while stopped for a few minutes in traffic? 0 As a passenger in a car for an hour without a break? 1  Total =14-15 points, EDS still present has to be correlated to CPAP compliance.      Observations/Objective: CPAP machine is over 26 years old  ago- on 11. 12.2014 was her last sleep study. She is due for a new machine.   Heather [name] also stated that she has nocturia between 1 and 3 times over her nocturnal sleep period of only 4 to 5 hours.   Depending on when she takes her diuretic, she may wake up every hour.   She does go back to sleep again within seconds.  Her husband confirmed over the phone that she is not snoring,  not sleepwalking, and that she sleeps deeper in her naps without CPAP or oxygen being present as long as those are in a recliner.   This indicates that Heather [name] may have orthopnea.  She felt feels in general that her naps are more refreshing than her nocturnal sleep.    Assessment and Plan:  Patient remains on CPAP and o2 , but is due for  a new machine. I ordered an HST for one night without CPAP use, and without 02 use.   I like for her to set her future CPAP up in her bedroom and the current CPAP in her living room, next to her recliner. We should be able to get a merged report of both devices in the future.    Follow Up Instructions:    I discussed the assessment and treatment plan with the patient. The patient was provided an opportunity to ask questions and all were answered. The patient agreed with the plan and demonstrated an understanding of the instructions.   The patient was advised to call back or seek an in-person evaluation if the symptoms worsen or if the condition fails to improve as anticipated.  Topiramate refill, HST to confirm current level of apnea, New machine needed, compliance is poor as she naps without CPAP in her recliner.  RV in 3 month with new auto BiPAP, continue 02, HST ordered.  .    I provided 25 minutes of non-face-to-face time during this encounter.   Larey Seat, MD  05-24-2018      PS : previous notes:.  Nerve conduction studies done on both lower extremities shows evidence of a primarily axonal peripheral neuropathy of moderate severity. This may be related to diabetes. EMG evaluation of the right lower extremity shows mild distal chronic and acute denervation consistent with the diagnosis of peripheral neuropathy. There is no evidence of an overlying lumbosacral radiculopathy or definite evidence of a myopathic disorder. Jill Alexanders MD 05/19/2016 11:28 AM   Heather [name], a  73 year old right-handed [place] American female , is a former patient of Dr. Elvia Collum and is followed now by Dr. Floyde Parkins here at [facility]. The patient's pulmonologist, Dr. Chase Caller, recommended a review of her sleep breathing disorder in light of a new  diagnosis of nocturnal hypoxemia. The patient desaturates according to her her husband and from recollection of  ONO data  into the 60s and 70s at night ( as documented on a home based  pulse oximetry). Cardiologist evaluation  found hypoxemia . The patient started per cardiologist on Losartin and ASA. She was referred by Dr. Doy Mince in [name] 2011 for a sleep study based on chief complaints of snoring, witnessed apneas and excessive daytime sleepiness. At the time the patient endorsed  the Epworth sleepiness score at a very high count of 20/24 points and the Beck's inventory at 3 points, her BMI was 49.9, and her neck conference measured 14.5 inches . She was diagnosed with OSA after her study revealed an AHI of  11.7 (which constitutes mild apnea). During REM sleep, her AHI increased to 68.2/hr.  The oxygen  desaturations associated with REM sleep in supine sleep position  were suspected to be a cause for the patient's excessive daytime sleepiness and the reported morning headaches. The patient had reported at that time that she would prefer to sleep up sitting in a chair or recliner.I saw Heather [name] last she has been compliantly using her BiPAP machine.  she underwent a split night polysomnography on 12-13-12 and had endorsed the Epworth sleepiness score at 21 points/  her neck circumference was 14.5 inches/  BMI 48.4.  The  patient had an AHI of 46.8 which constitutes severe apnea RDI was 48.7/ hr . The Co2 retention was not found (by a CMS criteria) but she was at a level of 49.4 torr, very close to the threshold of 50 Torr. [name] Respiratory events were hypopneas and not frank apneas. Oxygen lowest saturation was 75% but 110 minutes of desaturation during the baseline part of this but study. The patient was titrated to a BiPAP setting of 11/7 cm water under which she slept 120 minutes and reached finally REM sleep. The patient tolerated BiPAP much better than CPAP , an ESON  medium size nasal mask was used . She had prolonged hypoxemia persisted in spite of CPAP and BiPAP use at various times during the night -02   nadir of 78% during the in lab titration.   06-21-13  The last compliance data for a 30 day download were dated 04-07-13. The patient's compliance was 90%, average time of CPAP or BiPAP use was 5 hours and 43 minutes. The patient uses a BiPAP machine a so-called easy breath ,  inspiratory pressure support of 11 cm of water and expiratory pressure of 7 cm. The patient has an AHI of 0.9, she is compliant  By CMS criteria  was able to compared downloads from [facility] November 2014 to our in office data. The patient is using BiPAP  machine and has an inspiratory pressure of 11 cm and expiratory pressure of 7 cm water.  She has less nocturia and less dry mouth in the morning.   Taking Lasix to late will cause her nocturia. She is in PT .  02-12-14 Heather [name]'s compliance reports dating from 18-13 through 02-10-14 shows 100% compliance 4 days of use and 93% compliance for over 4 hours of daily use. Average use of the machine is 5 hours and 12 minutes. The IPAP is set at 11 cm and the expiratory pressure at 7 cm AHI residual at 0.9 with excellent she has a moderate air leak but it does not seem to influence the overall apnea count I would not like to change any of the settings I would offer to change the mask should the patient feels that there is a problem with the fit.  FSS 49 and Epworth 15 , still elevated, BMI still severely elevated.   Sleep clinic 02-11-15, The patient has a CPAP and an oxygen concentrator available to her. She had a lot of congestion and upper airway irritation over the last week and felt that it helped her to be less air hungry. She has an excellent report on compliance she's 100% compliance for the last 30 days, 24 of those days was over 4 hours of consecutive use. She uses an inspiratory pressure of 11 expiratory pressure of 7 cm water BiPAP is a residual AHI of 1.3. She has mild to moderate  air leaks. There is no reason to change any settings at this time. She  still endorsed the Epworth sleepiness score today at 19 points but attributes this also to her current struggle with an upper airway infection.  I am concerned that she is sleepy to this degree by using her CPAP compliantly. I offered modafinil to treat the sleepiness and daytime medically but she declined. 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   Interval history from 02/29/2016, Heather [name] is here today for her routine CPAP compliance visit. She is currently walking on a multi pronged cane, she went camping couple of weeks ago with her girl [employer] -she remains active physically and socially involved. I have also her compliance data here available, she has used CPAP 83 out of 90 days was 92% compliance and an average user time of 4 hours and 22 minutes. She is using an AutoPap BiPAP between 7 and 11 cm water pressure, with a residual AHI of 0.7. She does have some moderate to severe air leaks. These have not reduced the apnea control and I would therefore he can more than I hope she can continue to use CPAP with high compliance and work on average over 4 hours each night. I'm very happy with the resolution of apnea and its positive effect on her headaches as well. Her daytime level of alertness and her fatigue level are still elevated fatigue severity score at 43, Epworth sleepiness score at 20 points, but she has not used the prescribed modafinil. She has nights where she can barely get to sleep because of leg cramping like pain, she will wake up with a lack cramping it is sharp- spasticity that she describes,  not necessarily a discomfort that would go away by rubbing on massaging her legs. At times lancinating pain. Sometimes the spasm will not resolve, she ad painfull spasm  during aqua gymnastic.  She has sometimes found relief by taking potassium, diuretics may provoke this- lasix. 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  She reports struggling with e anemia on and off.  Will check.     Review of Systems: Out of a complete 14 system review, the patient complains of only the following symptoms, and all other reviewed systems are negative. The patient has SOB, morbidly obese, has CHF and ankle edema.  She is high fall risk, fell second Sunday in March - stumbled over something. No bony injuries.  Epworth is again 15 was  20 pre CPAP,  GDS at 2 points ,       Social History   Socioeconomic History  . Marital status: Married    Spouse name: [name]  . Number of children: 2  . Years of education: College  . Highest education level: Not on file  Occupational History  . Occupation: Retired  Scientific laboratory technician  . Financial resource strain: Not on file  . Food insecurity:    Worry: Not on file    Inability: Not on file  . Transportation needs:    Medical: Not on file    Non-medical: Not on file  Tobacco Use  . Smoking status: Former Smoker    Packs/day: 2.00  Years: 20.00    Pack years: 40.00    Types: Cigarettes    Last attempt to quit: 03/03/1979    Years since quitting: 39.2  . Smokeless tobacco: Never Used  Substance and Sexual Activity  . Alcohol use: Yes    Comment: rare  . Drug use: No  . Sexual activity: Yes    Birth control/protection: Surgical  Lifestyle  . Physical activity:    Days per week: Not on file    Minutes per session: Not on file  . Stress: Not on file  Relationships  . Social connections:    Talks on phone: Not on file    Gets together: Not on file    Attends religious service: Not on file    Active member of club or organization: Not on file    Attends meetings of clubs or organizations: Not on file    Relationship status: Not on file  . Intimate  partner violence:    Fear of current or ex partner: Not on file    Emotionally abused: Not on file    Physically abused: Not on file    Forced sexual activity: Not on file  Other Topics Concern  . Not on file  Social History Narrative   Patient is married [name]).   Patient has two children; 3 pregnancies 2 live births    Patient is retired Designer, jewellery, Chiropodist    Patient has a Financial risk analyst.   Patient is right-handed.   Patient lives at home with family.   Caffeine Use: 2 soda every other day   Former smoker 20+ years ago 2 ppd quit in 5s   No guns, wears seat belt, safe in relationship     Family History  Problem Relation Age of Onset  . Asthma Mother   . Depression Mother   . Bipolar disorder Mother   . Dementia Mother   . Arthritis Mother   . Breast cancer Mother        primary  . Colon cancer Mother        mets from breast  . Cancer Mother        colon  . Hypertension Father   . Migraines Father   . Cancer Maternal Aunt        ?  [name] Heart disease Maternal Grandmother   . Cancer Maternal Grandfather        stomach ?    Past Medical History:  Diagnosis Date  . Abdominal hernia   . Abnormal Pap smear   . ALLERGIC RHINITIS 10/22/2006  . Allergy   . ANEMIA 12/18/2008  . Arthritis    scoliosis moderate deg changes lumbar Xray 11/04/17   . ASYMPTOMATIC POSTMENOPAUSAL STATUS 11/22/2007  . Back pain   . CAD (coronary artery disease)    in LAD  . Complication of anesthesia   . Degenerative arthritis   . DIABETES MELLITUS, TYPE II 01/13/2007  . Dyslipidemia   . Fibroid   . Frequent headaches   . GERD 07/20/2007  . GOITER, MULTINODULAR 07/20/2007  . Headache(784.0) 07/20/2007  . HEARING LOSS 11/22/2007  . Hiatal hernia   . Hiatal hernia    large  . History of chicken pox   . Hx of colposcopy with cervical biopsy   . HYPERCHOLESTEROLEMIA 01/13/2007  . Hyperglycemia   . HYPERTENSION 10/22/2006  . Lung nodule     unchanged since 05/26/13   . Migraines   . NASH (nonalcoholic steatohepatitis)   .  Nocturnal hypoxemia 02/17/2013  . Obesity   . Obstructive sleep apnea   . OSA on CPAP    per neurology  . OSTEOARTHRITIS 10/22/2006  . Other chronic nonalcoholic liver disease 04/11/4707  . Sedimentation rate elevation   . Sleep apnea    on cpap  . Urine incontinence   . UTI (urinary tract infection)     Past Surgical History:  Procedure Laterality Date  . BREAST SURGERY  1984   Breast reduction b/l   . CATARACT EXTRACTION, BILATERAL    . DEXA  08/2005  . DILATION AND CURETTAGE OF UTERUS    . ELECTROCARDIOGRAM  10/15/2006  . ESOPHAGOGASTRODUODENOSCOPY  12/08/2005  . FOOT SURGERY     hammertoe and bunion 05/2017   . Stress Cardiolite  10/21/2005  . sweat gland removal    . WISDOM TOOTH EXTRACTION      Current Outpatient Medications  Medication Sig Dispense Refill  . acetaminophen (TYLENOL) 500 MG tablet Take 650 mg by mouth 3 (three) times daily as needed for pain.     . Calcium Carbonate-Vitamin D (CALCIUM 600 + D PO) Take 2 tablets by mouth daily.      . clopidogrel (PLAVIX) 75 MG tablet Take 1 tablet (75 mg total) by mouth daily. 90 tablet 3  . Ferrous Sulfate (IRON) 325 (65 FE) MG TABS Take 1 tablet by mouth daily.      . fexofenadine (ALLEGRA) 180 MG tablet Take 180 mg by mouth daily.    . furosemide (LASIX) 40 MG tablet Take 1 tablet (40 mg total) by mouth daily. 90 tablet 3  . gabapentin (NEURONTIN) 100 MG capsule Take 2 capsules (200 mg total) by mouth at bedtime. 180 capsule 3  . glucosamine-chondroitin 500-400 MG tablet Take 1 tablet by mouth 2 (two) times daily.      [name] losartan (COZAAR) 25 MG tablet TAKE 1 TABLET EVERY DAY    . metFORMIN (GLUCOPHAGE-XR) 500 MG 24 hr tablet Take 2 tablets (1,000 mg total) by mouth at bedtime. 180 tablet 3  . modafinil (PROVIGIL) 200 MG tablet modafinil 200 mg tablet    . Multiple Vitamin (MULTIVITAMIN) tablet Take 1 tablet by mouth daily.      .  naproxen sodium (ANAPROX) 220 MG tablet Take 220 mg by mouth as needed.    . Omega-3 Fatty Acids (FISH OIL) 1000 MG CAPS Take 1 capsule by mouth daily.    [name] omeprazole (PRILOSEC) 20 MG capsule Take 20 mg by mouth daily as needed.     . potassium chloride SA (KLOR-CON M20) 20 MEQ tablet Take 1 tablet (20 mEq total) by mouth daily. 90 tablet 3  . pyridOXINE (VITAMIN B-6) 100 MG tablet Take 200 mg by mouth daily.    . rosuvastatin (CRESTOR) 40 MG tablet Take 1 tablet (40 mg total) by mouth daily. At night 90 tablet 3  . SF 5000 PLUS 1.1 % CREA dental cream   0  . SUMAtriptan (IMITREX) 20 MG/ACT nasal spray sumatriptan 20 mg/actuation nasal spray    . topiramate (TOPAMAX) 100 MG tablet TAKE 1 TABLET EVERY DAY 90 tablet 3  . Turmeric 500 MG TABS Take 2 tablets by mouth daily.    [name] venlafaxine XR (EFFEXOR XR) 37.5 MG 24 hr capsule Take 1 capsule (37.5 mg total) by mouth daily with breakfast. 90 capsule 1  . vitamin E 200 UNIT capsule Take 200 Units by mouth daily.    [name] Zoster Vaccine Live, PF, (ZOSTAVAX) 62836 UNT/0.65ML injection Zostavax (  PF) 19,400 unit/0.65 mL subcutaneous suspension     Current Facility-Administered Medications  Medication Dose Route Frequency Provider Last Rate Last Dose  . 0.9 %  sodium chloride infusion  500 mL Intravenous Continuous Mauri Pole, MD        Allergies as of 05/24/2018 - Review Complete 05/19/2018  Allergen Reaction Noted  . Aspirin  08/04/2011  . Coconut oil  07/10/2013  . Lisinopril  06/03/2006  . Metoprolol Itching 02/12/2014  . Peanut-containing drug products  07/10/2013  . Penicillins  06/03/2006  . Prednisone  07/12/2012  . Strawberry extract  07/10/2013    Vitals: There were no vitals taken for this visit. Last Weight:  Wt Readings from Last 1 Encounters:  05/19/18 195 lb (88.5 kg)   Last Height:   Ht Readings from Last 1 Encounters:  05/19/18 5' 5"  (1.651 m)    Physical exam: Her headaches have been treated by Dr Jannifer Franklin  Topiramate .  She has documented Edema, SOB and ONO documented hypoxia at night.   Plan:  Treatment plan and additional workup :   Face to face RV in within 3 month after new CPAP has been issued.  She has been using an oxygen concentrator based on January 2015 ONO result with 2 2.1 hours of hypoxemia while on PAP-  since then she noted improvement in Epworth and FSS.  HST Needs new CPAP machine- Autotitration capable - after HST, will need continue 02 supplement ( pulmonologist is not following)    Her headaches have resolved on CPAP , Next visit with NP or me for new machine compliance .  Cc: Dr Jannifer Franklin.   Larey Seat, MD

## 2018-05-25 ENCOUNTER — Ambulatory Visit (INDEPENDENT_AMBULATORY_CARE_PROVIDER_SITE_OTHER): Payer: Medicare Other | Admitting: Family Medicine

## 2018-05-25 ENCOUNTER — Encounter: Payer: Self-pay | Admitting: Family Medicine

## 2018-05-25 ENCOUNTER — Other Ambulatory Visit: Payer: Self-pay

## 2018-05-25 DIAGNOSIS — M170 Bilateral primary osteoarthritis of knee: Secondary | ICD-10-CM

## 2018-05-25 NOTE — Progress Notes (Signed)
[name] [place] [place], [place] [address] Phone: [phone] Subjective:     CC: Bilateral knee pain I, Heather [name], am serving as a scribe for Dr. Hulan Saas.  TTS:[id number]  Heather [name] is a 73 y.o. female coming in with complaint of bilateral knee pain. Patient states that her knees are not as bad as they have been but that they are getting worse.      Past Medical History:  Diagnosis Date  . Abdominal hernia   . Abnormal Pap smear   . ALLERGIC RHINITIS 10/22/2006  . Allergy   . ANEMIA 12/18/2008  . Arthritis    scoliosis moderate deg changes lumbar Xray 11/04/17   . ASYMPTOMATIC POSTMENOPAUSAL STATUS 11/22/2007  . Back pain   . CAD (coronary artery disease)    in LAD  . Complication of anesthesia   . Degenerative arthritis   . DIABETES MELLITUS, TYPE II 01/13/2007  . Dyslipidemia   . Fibroid   . Frequent headaches   . GERD 07/20/2007  . GOITER, MULTINODULAR 07/20/2007  . Headache(784.0) 07/20/2007  . HEARING LOSS 11/22/2007  . Hiatal hernia   . Hiatal hernia    large  . History of chicken pox   . Hx of colposcopy with cervical biopsy   . HYPERCHOLESTEROLEMIA 01/13/2007  . Hyperglycemia   . HYPERTENSION 10/22/2006  . Lung nodule    unchanged since 05/26/13   . Migraines   . NASH (nonalcoholic steatohepatitis)   . Nocturnal hypoxemia 02/17/2013  . Obesity   . Obstructive sleep apnea   . OSA on CPAP    per neurology  . OSTEOARTHRITIS 10/22/2006  . Other chronic nonalcoholic liver disease 11/22/3005  . Sedimentation rate elevation   . Sleep apnea    on cpap  . Urine incontinence   . UTI (urinary tract infection)    Past Surgical History:  Procedure Laterality Date  . BREAST SURGERY  1984   Breast reduction b/l   . CATARACT EXTRACTION, BILATERAL    . DEXA  08/2005  . DILATION AND CURETTAGE OF UTERUS    . ELECTROCARDIOGRAM  10/15/2006  . ESOPHAGOGASTRODUODENOSCOPY  12/08/2005  . FOOT SURGERY     hammertoe and  bunion 05/2017   . Stress Cardiolite  10/21/2005  . sweat gland removal    . WISDOM TOOTH EXTRACTION     Social History   Socioeconomic History  . Marital status: Married    Spouse name: Heather [name]  . Number of children: 2  . Years of education: College  . Highest education level: Not on file  Occupational History  . Occupation: Retired  Scientific laboratory technician  . Financial resource strain: Not on file  . Food insecurity:    Worry: Not on file    Inability: Not on file  . Transportation needs:    Medical: Not on file    Non-medical: Not on file  Tobacco Use  . Smoking status: Former Smoker    Packs/day: 2.00    Years: 20.00    Pack years: 40.00    Types: Cigarettes    Last attempt to quit: 03/03/1979    Years since quitting: 39.2  . Smokeless tobacco: Never Used  Substance and Sexual Activity  . Alcohol use: Yes    Comment: rare  . Drug use: No  . Sexual activity: Yes    Birth control/protection: Surgical  Lifestyle  . Physical activity:    Days per week: Not on file  Minutes per session: Not on file  . Stress: Not on file  Relationships  . Social connections:    Talks on phone: Not on file    Gets together: Not on file    Attends religious service: Not on file    Active member of club or organization: Not on file    Attends meetings of clubs or organizations: Not on file    Relationship status: Not on file  Other Topics Concern  . Not on file  Social History Narrative   Patient is married Heather [name]).   Patient has two children; 3 pregnancies 2 live births    Patient is retired Designer, jewellery, Chiropodist    Patient has a Financial risk analyst.   Patient is right-handed.   Patient lives at home with family.   Caffeine Use: 2 soda every other day   Former smoker 20+ years ago 2 ppd quit in 50s   No guns, wears seat belt, safe in relationship    Allergies  Allergen Reactions  . Aspirin   . Coconut Oil   . Lisinopril     REACTION:  cough  . Metoprolol Itching  . Peanut-Containing Drug Products   . Penicillins     REACTION: rash  . Prednisone   . Strawberry Extract    Family History  Problem Relation Age of Onset  . Asthma Mother   . Depression Mother   . Bipolar disorder Mother   . Dementia Mother   . Arthritis Mother   . Breast cancer Mother        primary  . Colon cancer Mother        mets from breast  . Cancer Mother        colon  . Hypertension Father   . Migraines Father   . Cancer Maternal Aunt        ?  [name] Heart disease Maternal Grandmother   . Cancer Maternal Grandfather        stomach ?    Current Outpatient Medications (Endocrine & Metabolic):  .  metFORMIN (GLUCOPHAGE-XR) 500 MG 24 hr tablet, Take 2 tablets (1,000 mg total) by mouth at bedtime.   Current Outpatient Medications (Cardiovascular):  .  furosemide (LASIX) 40 MG tablet, Take 1 tablet (40 mg total) by mouth daily. [name]  losartan (COZAAR) 25 MG tablet, TAKE 1 TABLET EVERY DAY .  rosuvastatin (CRESTOR) 40 MG tablet, Take 1 tablet (40 mg total) by mouth daily. At night   Current Outpatient Medications (Respiratory):  .  fexofenadine (ALLEGRA) 180 MG tablet, Take 180 mg by mouth daily.   Current Outpatient Medications (Analgesics):  .  acetaminophen (TYLENOL) 500 MG tablet, Take 650 mg by mouth 3 (three) times daily as needed for pain.  .  naproxen sodium (ANAPROX) 220 MG tablet, Take 220 mg by mouth as needed. .  SUMAtriptan (IMITREX) 20 MG/ACT nasal spray, sumatriptan 20 mg/actuation nasal spray   Current Outpatient Medications (Hematological):  .  clopidogrel (PLAVIX) 75 MG tablet, Take 1 tablet (75 mg total) by mouth daily. .  Ferrous Sulfate (IRON) 325 (65 FE) MG TABS, Take 1 tablet by mouth daily.     Current Outpatient Medications (Other):  [name]  Calcium Carbonate-Vitamin D (CALCIUM 600 + D PO), Take 2 tablets by mouth daily.   [name]  gabapentin (NEURONTIN) 100 MG capsule, Take 2 capsules (200 mg total) by mouth at bedtime.  [name]  glucosamine-chondroitin 500-400 MG tablet, Take 1 tablet by mouth 2 (  two) times daily.   .  modafinil (PROVIGIL) 200 MG tablet, modafinil 200 mg tablet .  Multiple Vitamin (MULTIVITAMIN) tablet, Take 1 tablet by mouth daily.   .  Omega-3 Fatty Acids (FISH OIL) 1000 MG CAPS, Take 1 capsule by mouth daily. [name]  omeprazole (PRILOSEC) 20 MG capsule, Take 20 mg by mouth daily as needed.  .  potassium chloride SA (KLOR-CON M20) 20 MEQ tablet, Take 1 tablet (20 mEq total) by mouth daily. [name]  pyridOXINE (VITAMIN B-6) 100 MG tablet, Take 200 mg by mouth daily. .  SF 5000 PLUS 1.1 % CREA dental cream,  .  topiramate (TOPAMAX) 100 MG tablet, Take 1 tablet (100 mg total) by mouth 2 (two) times daily. .  Turmeric 500 MG TABS, Take 2 tablets by mouth daily. [name]  venlafaxine XR (EFFEXOR XR) 37.5 MG 24 hr capsule, Take 1 capsule (37.5 mg total) by mouth daily with breakfast. .  vitamin E 200 UNIT capsule, Take 200 Units by mouth daily. [name]  Zoster Vaccine Live, PF, (ZOSTAVAX) 03474 UNT/0.65ML injection, Zostavax (PF) 19,400 unit/0.65 mL subcutaneous suspension  Current Facility-Administered Medications (Other):  .  0.9 %  sodium chloride infusion    Past medical history, social, surgical and family history all reviewed in electronic medical record.  No pertanent information unless stated regarding to the chief complaint.   Review of Systems:  No headache, visual changes, nausea, vomiting, diarrhea, constipation, dizziness, abdominal pain, skin rash, fevers, chills, night sweats, weight loss, swollen lymph nodes, body aches,, chest pain, shortness of breath, mood changes.  Positive muscle aches and joint swelling  Objective  Blood pressure (!) 110/58, pulse 64, height 5' 5"  (1.651 m), weight 198 lb (89.8 kg), SpO2 98 %.    General: No apparent distress alert and oriented x3 mood and affect normal, dressed appropriately.  HEENT: Pupils equal, extraocular movements intact  Respiratory: Patient's speak in  full sentences and does not appear short of breath  Cardiovascular: No lower extremity edema, non tender, no erythema  Skin: Warm dry intact with no signs of infection or rash on extremities or on axial skeleton.  Abdomen: Soft nontender  Neuro: Cranial nerves II through XII are intact, neurovascularly intact in all extremities with 2+ DTRs and 2+ pulses.  Lymph: No lymphadenopathy of posterior or anterior cervical chain or axillae bilaterally.  Gait antalgic gait MSK:  tender with range of motion and good stability and symmetric strength and tone of shoulders, elbows, wrist, hip, and ankles bilaterally.  Arthritic changes of multiple joints  Knee: Bilateral valgus deformity noted. Large thigh to calf ratio.  Tender to palpation over medial and PF joint line.  ROM full in flexion and extension and lower leg rotation. instability with valgus force.  painful patellar compression. Patellar glide with moderate crepitus. Patellar and quadriceps tendons unremarkable. Hamstring and quadriceps strength is normal.  After informed written and verbal consent, patient was seated on exam table. Right knee was prepped with alcohol swab and utilizing anterolateral approach, patient's right knee space was injected with 4:1  marcaine 0.5%: Kenalog 39m/dL. Patient tolerated the procedure well without immediate complications.  After informed written and verbal consent, patient was seated on exam table. Left knee was prepped with alcohol swab and utilizing anterolateral approach, patient's left knee space was injected with 4:1  marcaine 0.5%: Kenalog 420mdL. Patient tolerated the procedure well without immediate complications.   Impression and Recommendations:     This case required medical decision making of moderate complexity. The above  documentation has been reviewed and is accurate and complete Lyndal Pulley, DO       Note: This dictation was prepared with Dragon dictation along with smaller  phrase technology. Any transcriptional errors that result from this process are unintentional.

## 2018-05-25 NOTE — Patient Instructions (Signed)
Good to see you  Stay safe and stay home  See me again in 2 months!

## 2018-05-25 NOTE — Assessment & Plan Note (Signed)
Bilateral injections given today.  Tolerated the procedure well, I hope that this will continue to give her some benefit.  Has done viscosupplementation in the past with very minimal benefit from time to time and will consider repeating that if necessary.  Encourage weight loss, stay active.  Follow-up again in 4 to 8 weeks

## 2018-06-05 ENCOUNTER — Encounter (INDEPENDENT_AMBULATORY_CARE_PROVIDER_SITE_OTHER): Payer: Self-pay | Admitting: Family Medicine

## 2018-06-09 ENCOUNTER — Other Ambulatory Visit: Payer: Self-pay

## 2018-06-09 ENCOUNTER — Encounter (INDEPENDENT_AMBULATORY_CARE_PROVIDER_SITE_OTHER): Payer: Self-pay | Admitting: Family Medicine

## 2018-06-09 ENCOUNTER — Ambulatory Visit (INDEPENDENT_AMBULATORY_CARE_PROVIDER_SITE_OTHER): Payer: Medicare Other | Admitting: Family Medicine

## 2018-06-09 DIAGNOSIS — E669 Obesity, unspecified: Secondary | ICD-10-CM

## 2018-06-09 DIAGNOSIS — F3289 Other specified depressive episodes: Secondary | ICD-10-CM | POA: Diagnosis not present

## 2018-06-09 DIAGNOSIS — Z6832 Body mass index (BMI) 32.0-32.9, adult: Secondary | ICD-10-CM | POA: Diagnosis not present

## 2018-06-13 NOTE — Progress Notes (Signed)
Office: [phone]  /  Fax: [phone] TeleHealth Visit:  Heather [name] has verbally consented to this TeleHealth visit today. The patient is located at home, the provider is located at the [facility] office. The participants in this visit include the listed provider and patient and spouse. [name] was unable to use realtime audiovisual technology today and the telehealth visit was conducted via telephone.   HPI:   Chief Complaint: OBESITY Heather [name] is here to discuss her progress with her obesity treatment plan. She is on the Category 2 plan and is following her eating plan approximately 75 % of the time. She states she is on the elliptical for 30-40 minutes 4 times per week and walking outside. [name] was unable to skype. She states she has done well maintaining her weight. She may have even lost 1-2 lbs in the last 2-3 weeks. She notes some cravings but is trying to portion control. She is working on increasing vegetables and is doing better with meal planning. She states her weight is 195 lbs this morning at home.  We were unable to weigh the patient today for this TeleHealth visit. She feels as if she has maintained her weight since her last visit. She has lost 73 lbs since starting treatment with [name].  Depression with Emotional Eating Behaviors [name] is stable on Effexor. Her mood is good during this COVID-19 isolation, as she is working to stay bus. She is staying in contact with family and friends to help keep her occupied. She is sleeping well and notes decreased irritability. [name] struggles with emotional eating and using food for comfort to the extent that it is negatively impacting her health. She often snacks when she is not hungry. [name] sometimes feels she is out of control and then feels guilty that she made poor food choices. She has been working on behavior modification techniques to help reduce her emotional eating and has been somewhat successful. She shows no  sign of suicidal or homicidal ideations.  Depression screen [facility] 2/9 01/05/2018 12/23/2016  Decreased Interest 0 2  Down, Depressed, Hopeless 0 1  PHQ - 2 Score 0 3  Altered sleeping - 1  Tired, decreased energy - 3  Change in appetite - 1  Feeling bad or failure about yourself  - 0  Trouble concentrating - 0  Moving slowly or fidgety/restless - 2  Suicidal thoughts - 0  PHQ-9 Score - 10  Difficult doing work/chores - Somewhat difficult  Some recent data might be hidden     ASSESSMENT AND PLAN:  Other depression - with emotional eating  Class 1 obesity with serious comorbidity and body mass index (BMI) of 32.0 to 32.9 in adult, unspecified obesity type  PLAN:  Depression with Emotional Eating Behaviors We discussed behavior modification techniques today to help Heather [name] deal with her emotional eating and depression. Heather [name] agrees to continue taking Effexor and diet, and will continue to monitor. Heather [name] agrees to follow up with our clinic in 3 weeks.  I spent > than 50% of the 15 minute visit on counseling as documented in the note.  Obesity Heather [name] is currently in the action stage of change. As such, her goal is to continue with weight loss efforts She has agreed to follow the Category 2 plan Heather [name] has been instructed to work up to a goal of 150 minutes of combined cardio and strengthening exercise per week for weight loss and overall health benefits. We discussed the following Behavioral Modification Strategies today:  increasing lean protein intake, no skipping meals, and avoiding temptations   Heather [name] has agreed to follow up with our clinic in 3 weeks. She was informed of the importance of frequent follow up visits to maximize her success with intensive lifestyle modifications for her multiple health conditions.  ALLERGIES: Allergies  Allergen Reactions  . Aspirin   . Coconut Oil   . Lisinopril     REACTION: cough  . Metoprolol Itching  . Peanut-Containing Drug Products   .  Penicillins     REACTION: rash  . Prednisone   . Strawberry Extract     MEDICATIONS: Current Outpatient Medications on File Prior to Visit  Medication Sig Dispense Refill  . acetaminophen (TYLENOL) 500 MG tablet Take 650 mg by mouth 3 (three) times daily as needed for pain.     . Calcium Carbonate-Vitamin D (CALCIUM 600 + D PO) Take 2 tablets by mouth daily.      . clopidogrel (PLAVIX) 75 MG tablet Take 1 tablet (75 mg total) by mouth daily. 90 tablet 3  . Ferrous Sulfate (IRON) 325 (65 FE) MG TABS Take 1 tablet by mouth daily.      . fexofenadine (ALLEGRA) 180 MG tablet Take 180 mg by mouth daily.    . furosemide (LASIX) 40 MG tablet Take 1 tablet (40 mg total) by mouth daily. 90 tablet 3  . gabapentin (NEURONTIN) 100 MG capsule Take 2 capsules (200 mg total) by mouth at bedtime. 180 capsule 3  . glucosamine-chondroitin 500-400 MG tablet Take 1 tablet by mouth 2 (two) times daily.      [name] losartan (COZAAR) 25 MG tablet TAKE 1 TABLET EVERY DAY    . metFORMIN (GLUCOPHAGE-XR) 500 MG 24 hr tablet Take 2 tablets (1,000 mg total) by mouth at bedtime. 180 tablet 3  . modafinil (PROVIGIL) 200 MG tablet modafinil 200 mg tablet    . Multiple Vitamin (MULTIVITAMIN) tablet Take 1 tablet by mouth daily.      . naproxen sodium (ANAPROX) 220 MG tablet Take 220 mg by mouth as needed.    . Omega-3 Fatty Acids (FISH OIL) 1000 MG CAPS Take 1 capsule by mouth daily.    [name] omeprazole (PRILOSEC) 20 MG capsule Take 20 mg by mouth daily as needed.     . potassium chloride SA (KLOR-CON M20) 20 MEQ tablet Take 1 tablet (20 mEq total) by mouth daily. 90 tablet 3  . pyridOXINE (VITAMIN B-6) 100 MG tablet Take 200 mg by mouth daily.    . rosuvastatin (CRESTOR) 40 MG tablet Take 1 tablet (40 mg total) by mouth daily. At night 90 tablet 3  . SF 5000 PLUS 1.1 % CREA dental cream   0  . SUMAtriptan (IMITREX) 20 MG/ACT nasal spray sumatriptan 20 mg/actuation nasal spray    . topiramate (TOPAMAX) 100 MG tablet Take 1  tablet (100 mg total) by mouth 2 (two) times daily. 180 tablet 3  . Turmeric 500 MG TABS Take 2 tablets by mouth daily.    [name] venlafaxine XR (EFFEXOR XR) 37.5 MG 24 hr capsule Take 1 capsule (37.5 mg total) by mouth daily with breakfast. 90 capsule 1  . vitamin E 200 UNIT capsule Take 200 Units by mouth daily.    [name] Zoster Vaccine Live, PF, (ZOSTAVAX) 04540 UNT/0.65ML injection Zostavax (PF) 19,400 unit/0.65 mL subcutaneous suspension     Current Facility-Administered Medications on File Prior to Visit  Medication Dose Route Frequency Provider Last Rate Last Dose  . 0.9 %  sodium  chloride infusion  500 mL Intravenous Continuous Nandigam, Venia Minks, MD        PAST MEDICAL HISTORY: Past Medical History:  Diagnosis Date  . Abdominal hernia   . Abnormal Pap smear   . ALLERGIC RHINITIS 10/22/2006  . Allergy   . ANEMIA 12/18/2008  . Arthritis    scoliosis moderate deg changes lumbar Xray 11/04/17   . ASYMPTOMATIC POSTMENOPAUSAL STATUS 11/22/2007  . Back pain   . CAD (coronary artery disease)    in LAD  . Complication of anesthesia   . Degenerative arthritis   . DIABETES MELLITUS, TYPE II 01/13/2007  . Dyslipidemia   . Fibroid   . Frequent headaches   . GERD 07/20/2007  . GOITER, MULTINODULAR 07/20/2007  . Headache(784.0) 07/20/2007  . HEARING LOSS 11/22/2007  . Hiatal hernia   . Hiatal hernia    large  . History of chicken pox   . Hx of colposcopy with cervical biopsy   . HYPERCHOLESTEROLEMIA 01/13/2007  . Hyperglycemia   . HYPERTENSION 10/22/2006  . Lung nodule    unchanged since 05/26/13   . Migraines   . NASH (nonalcoholic steatohepatitis)   . Nocturnal hypoxemia 02/17/2013  . Obesity   . Obstructive sleep apnea   . OSA on CPAP    per neurology  . OSTEOARTHRITIS 10/22/2006  . Other chronic nonalcoholic liver disease 3/38/2505  . Sedimentation rate elevation   . Sleep apnea    on cpap  . Urine incontinence   . UTI (urinary tract infection)     PAST SURGICAL HISTORY: Past  Surgical History:  Procedure Laterality Date  . BREAST SURGERY  1984   Breast reduction b/l   . CATARACT EXTRACTION, BILATERAL    . DEXA  08/2005  . DILATION AND CURETTAGE OF UTERUS    . ELECTROCARDIOGRAM  10/15/2006  . ESOPHAGOGASTRODUODENOSCOPY  12/08/2005  . FOOT SURGERY     hammertoe and bunion 05/2017   . Stress Cardiolite  10/21/2005  . sweat gland removal    . WISDOM TOOTH EXTRACTION      SOCIAL HISTORY: Social History   Tobacco Use  . Smoking status: Former Smoker    Packs/day: 2.00    Years: 20.00    Pack years: 40.00    Types: Cigarettes    Last attempt to quit: 03/03/1979    Years since quitting: 39.3  . Smokeless tobacco: Never Used  Substance Use Topics  . Alcohol use: Yes    Comment: rare  . Drug use: No    FAMILY HISTORY: Family History  Problem Relation Age of Onset  . Asthma Mother   . Depression Mother   . Bipolar disorder Mother   . Dementia Mother   . Arthritis Mother   . Breast cancer Mother        primary  . Colon cancer Mother        mets from breast  . Cancer Mother        colon  . Hypertension Father   . Migraines Father   . Cancer Maternal Aunt        ?  [name] Heart disease Maternal Grandmother   . Cancer Maternal Grandfather        stomach ?    ROS: Review of Systems  Constitutional: Negative for weight loss.  Psychiatric/Behavioral: Positive for depression. Negative for suicidal ideas.    PHYSICAL EXAM: Pt in no acute distress  RECENT LABS AND TESTS: BMET    Component Value Date/Time   NA  141 04/27/2018 1219   K 4.0 04/27/2018 1219   CL 104 04/27/2018 1219   CO2 22 04/27/2018 1219   GLUCOSE 78 04/27/2018 1219   GLUCOSE 116 (H) 03/05/2016 0958   BUN 13 04/27/2018 1219   CREATININE 0.67 04/27/2018 1219   CREATININE 0.81 12/28/2011 0853   CALCIUM 9.2 04/27/2018 1219   GFRNONAA 89 04/27/2018 1219   GFRAA 102 04/27/2018 1219   Lab Results  Component Value Date   HGBA1C 5.6 04/27/2018   HGBA1C 6.0 (H) 12/01/2017    HGBA1C 5.7 (H) 07/29/2017   HGBA1C 5.9 03/15/2017   HGBA1C 6.3 (H) 12/23/2016   Lab Results  Component Value Date   INSULIN 3.6 04/27/2018   INSULIN 5.6 12/01/2017   INSULIN 6.8 07/29/2017   INSULIN 20.9 12/23/2016   CBC    Component Value Date/Time   WBC 4.5 04/26/2018 1602   RBC 4.19 04/26/2018 1602   HGB 12.9 04/26/2018 1602   HGB 12.4 12/01/2017 1146   HCT 38.3 04/26/2018 1602   HCT 39.5 12/01/2017 1146   PLT 248.0 04/26/2018 1602   MCV 91.2 04/26/2018 1602   MCV 91 12/01/2017 1146   MCH 28.5 12/01/2017 1146   MCH 29.4 12/28/2011 0853   MCHC 33.7 04/26/2018 1602   RDW 16.7 (H) 04/26/2018 1602   RDW 16.2 (H) 12/01/2017 1146   LYMPHSABS 1.3 04/26/2018 1602   LYMPHSABS 1.2 12/01/2017 1146   MONOABS 0.4 04/26/2018 1602   EOSABS 0.1 04/26/2018 1602   EOSABS 0.1 12/01/2017 1146   BASOSABS 0.0 04/26/2018 1602   BASOSABS 0.0 12/01/2017 1146   Iron/TIBC/Ferritin/ %Sat    Component Value Date/Time   IRON 43 (L) 04/26/2018 1602   IRON 38 12/23/2016 0956   TIBC 249 (L) 04/26/2018 1602   TIBC 271 12/23/2016 0956   FERRITIN 60 04/26/2018 1602   FERRITIN 40 12/23/2016 0956   IRONPCTSAT 17 04/26/2018 1602   Lipid Panel     Component Value Date/Time   CHOL 207 (H) 04/27/2018 1219   TRIG 63 04/27/2018 1219   HDL 72 04/27/2018 1219   CHOLHDL 3 03/05/2016 0958   VLDL 26.0 03/05/2016 0958   LDLCALC 122 (H) 04/27/2018 1219   Hepatic Function Panel     Component Value Date/Time   PROT 7.0 04/27/2018 1219   ALBUMIN 4.0 04/27/2018 1219   AST 18 04/27/2018 1219   ALT 12 04/27/2018 1219   ALKPHOS 61 04/27/2018 1219   BILITOT <0.2 04/27/2018 1219   BILIDIR 0.1 02/15/2015 1010   IBILI 0.2 12/28/2011 0853      Component Value Date/Time   TSH 1.41 04/26/2018 1602   TSH 1.170 12/23/2016 0956   TSH 1.76 03/05/2016 0958      I, [name], am acting as transcriptionist for [name], MD I have reviewed the above documentation for accuracy and completeness, and  I agree with the above. -[name], MD

## 2018-06-14 ENCOUNTER — Encounter: Payer: Self-pay | Admitting: Internal Medicine

## 2018-06-15 ENCOUNTER — Encounter: Payer: Self-pay | Admitting: Internal Medicine

## 2018-06-15 ENCOUNTER — Telehealth: Payer: Self-pay | Admitting: Neurology

## 2018-06-15 ENCOUNTER — Encounter: Payer: Self-pay | Admitting: Neurology

## 2018-06-15 NOTE — Telephone Encounter (Signed)
Patient sent a message asking about a new bipap being ordered for her. I don't see where the order was placed yet but did see you mentioned auto bipap. If so let me know the range on auto bipap to order, or place order for the patient and I will send to [facility] for the patient.

## 2018-06-20 DIAGNOSIS — N6489 Other specified disorders of breast: Secondary | ICD-10-CM | POA: Diagnosis not present

## 2018-06-20 DIAGNOSIS — R921 Mammographic calcification found on diagnostic imaging of breast: Secondary | ICD-10-CM | POA: Diagnosis not present

## 2018-06-20 LAB — HM MAMMOGRAPHY

## 2018-06-22 ENCOUNTER — Encounter: Payer: Self-pay | Admitting: Internal Medicine

## 2018-06-29 ENCOUNTER — Ambulatory Visit (INDEPENDENT_AMBULATORY_CARE_PROVIDER_SITE_OTHER): Payer: Medicare Other | Admitting: Family Medicine

## 2018-06-29 ENCOUNTER — Encounter (INDEPENDENT_AMBULATORY_CARE_PROVIDER_SITE_OTHER): Payer: Self-pay | Admitting: Family Medicine

## 2018-06-29 ENCOUNTER — Other Ambulatory Visit: Payer: Self-pay

## 2018-06-29 DIAGNOSIS — E669 Obesity, unspecified: Secondary | ICD-10-CM | POA: Diagnosis not present

## 2018-06-29 DIAGNOSIS — F3289 Other specified depressive episodes: Secondary | ICD-10-CM

## 2018-06-29 DIAGNOSIS — Z6832 Body mass index (BMI) 32.0-32.9, adult: Secondary | ICD-10-CM

## 2018-06-30 NOTE — Progress Notes (Signed)
Office: [phone]  /  Fax: [phone] TeleHealth Visit:  Heather [name] has verbally consented to this TeleHealth visit today. The patient is located at home, the provider is located at the [employer] [facility] office. The participants in this visit include the listed provider, patient. and *Heather [name]. The visit was conducted today via [email or web address].  HPI:   Chief Complaint: OBESITY Heather [name] is here to discuss Heather progress with Heather obesity treatment plan. She is on the Category 2 plan and is following Heather eating plan approximately 75 % of the time. She states she is using the elliptical 30 minutes 2 to 3 times per week. Heather [name] feels that she has done well overall and feels that she has maintained Heather weight loss. She has been using the elliptical at home more often. She did about 3 to 4 days of stress eating and states that she is now back on track.  We were unable to weigh the patient today for this TeleHealth visit. She feels as if she has gained weight since Heather last visit. She has lost 73 lbs since starting treatment with [name].  Depression with emotional eating behaviors Heather [name] is on Effexor currently. She continues to struggle with social isolation during [place] and Heather mood is subdued. She had to have a repeat mammogram and this stressed Heather out. She has been working on behavior modification techniques to help reduce Heather emotional eating and has been somewhat successful. She shows no sign of suicidal or homicidal ideations.  ASSESSMENT AND PLAN:  Other depression - with emotional eating  Class 1 obesity with serious comorbidity and body mass index (BMI) of 32.0 to 32.9 in adult, unspecified obesity type  PLAN:  Depression with Emotional Eating Behaviors We discussed cognitive behavior modification techniques today to help Heather [name] deal with Heather mood and depression. She has agreed to take Effexor as prescribed. We will continue to monitor Heather progress and she agreed to follow  up as directed in 2 weeks.  I spent > than 50% of the 15 minute visit on counseling as documented in the note.  Obesity Heather [name] is currently in the action stage of change. As such, Heather goal is to continue with weight loss efforts.  She has agreed to follow the Category 2 plan. Heather [name] has been instructed to work up to a goal of 150 minutes of combined cardio and strengthening exercise per week for weight loss and overall health benefits. We discussed the following Behavioral Modification Strategies today: work on meal planning and easy cooking plans, emotional eating strategies, no skipping meals, and ways to avoid boredom eating.  Heather [name] has agreed to follow up with our clinic in 2 weeks. She was informed of the importance of frequent follow up visits to maximize Heather success with intensive lifestyle modifications for Heather multiple health conditions.  ALLERGIES: Allergies  Allergen Reactions  . Aspirin   . Coconut Oil   . Lisinopril     REACTION: cough  . Metoprolol Itching  . Peanut-Containing Drug Products   . Penicillins     REACTION: rash  . Prednisone   . Strawberry Extract     MEDICATIONS: Current Outpatient Medications on File Prior to Visit  Medication Sig Dispense Refill  . acetaminophen (TYLENOL) 500 MG tablet Take 650 mg by mouth 3 (three) times daily as needed for pain.     . Calcium Carbonate-Vitamin D (CALCIUM 600 + D PO) Take 2 tablets by mouth daily.      . clopidogrel (  PLAVIX) 75 MG tablet Take 1 tablet (75 mg total) by mouth daily. 90 tablet 3  . Ferrous Sulfate (IRON) 325 (65 FE) MG TABS Take 1 tablet by mouth daily.      . fexofenadine (ALLEGRA) 180 MG tablet Take 180 mg by mouth daily.    . furosemide (LASIX) 40 MG tablet Take 1 tablet (40 mg total) by mouth daily. 90 tablet 3  . gabapentin (NEURONTIN) 100 MG capsule Take 2 capsules (200 mg total) by mouth at bedtime. 180 capsule 3  . glucosamine-chondroitin 500-400 MG tablet Take 1 tablet by mouth 2 (two) times  daily.      [name] losartan (COZAAR) 25 MG tablet TAKE 1 TABLET EVERY DAY    . metFORMIN (GLUCOPHAGE-XR) 500 MG 24 hr tablet Take 2 tablets (1,000 mg total) by mouth at bedtime. 180 tablet 3  . modafinil (PROVIGIL) 200 MG tablet modafinil 200 mg tablet    . Multiple Vitamin (MULTIVITAMIN) tablet Take 1 tablet by mouth daily.      . naproxen sodium (ANAPROX) 220 MG tablet Take 220 mg by mouth as needed.    . Omega-3 Fatty Acids (FISH OIL) 1000 MG CAPS Take 1 capsule by mouth daily.    [name] omeprazole (PRILOSEC) 20 MG capsule Take 20 mg by mouth daily as needed.     . potassium chloride SA (KLOR-CON M20) 20 MEQ tablet Take 1 tablet (20 mEq total) by mouth daily. 90 tablet 3  . pyridOXINE (VITAMIN B-6) 100 MG tablet Take 200 mg by mouth daily.    . rosuvastatin (CRESTOR) 40 MG tablet Take 1 tablet (40 mg total) by mouth daily. At night 90 tablet 3  . SF 5000 PLUS 1.1 % CREA dental cream   0  . SUMAtriptan (IMITREX) 20 MG/ACT nasal spray sumatriptan 20 mg/actuation nasal spray    . topiramate (TOPAMAX) 100 MG tablet Take 1 tablet (100 mg total) by mouth 2 (two) times daily. 180 tablet 3  . Turmeric 500 MG TABS Take 2 tablets by mouth daily.    [name] venlafaxine XR (EFFEXOR XR) 37.5 MG 24 hr capsule Take 1 capsule (37.5 mg total) by mouth daily with breakfast. 90 capsule 1  . vitamin E 200 UNIT capsule Take 200 Units by mouth daily.    [name] Zoster Vaccine Live, PF, (ZOSTAVAX) 10272 UNT/0.65ML injection Zostavax (PF) 19,400 unit/0.65 mL subcutaneous suspension     Current Facility-Administered Medications on File Prior to Visit  Medication Dose Route Frequency Provider Last Rate Last Dose  . 0.9 %  sodium chloride infusion  500 mL Intravenous Continuous Nandigam, Venia Minks, MD        PAST MEDICAL HISTORY: Past Medical History:  Diagnosis Date  . Abdominal hernia   . Abnormal Pap smear   . ALLERGIC RHINITIS 10/22/2006  . Allergy   . ANEMIA 12/18/2008  . Arthritis    scoliosis moderate deg changes lumbar  Xray 11/04/17   . ASYMPTOMATIC POSTMENOPAUSAL STATUS 11/22/2007  . Back pain   . CAD (coronary artery disease)    in LAD  . Complication of anesthesia   . Degenerative arthritis   . DIABETES MELLITUS, TYPE II 01/13/2007  . Dyslipidemia   . Fibroid   . Frequent headaches   . GERD 07/20/2007  . GOITER, MULTINODULAR 07/20/2007  . Headache(784.0) 07/20/2007  . HEARING LOSS 11/22/2007  . Hiatal hernia   . Hiatal hernia    large  . History of chicken pox   . Hx of colposcopy with cervical  biopsy   . HYPERCHOLESTEROLEMIA 01/13/2007  . Hyperglycemia   . HYPERTENSION 10/22/2006  . Lung nodule    unchanged since 05/26/13   . Migraines   . NASH (nonalcoholic steatohepatitis)   . Nocturnal hypoxemia 02/17/2013  . Obesity   . Obstructive sleep apnea   . OSA on CPAP    per neurology  . OSTEOARTHRITIS 10/22/2006  . Other chronic nonalcoholic liver disease 06/13/2438  . Sedimentation rate elevation   . Sleep apnea    on cpap  . Urine incontinence   . UTI (urinary tract infection)     PAST SURGICAL HISTORY: Past Surgical History:  Procedure Laterality Date  . BREAST SURGERY  1984   Breast reduction b/l   . CATARACT EXTRACTION, BILATERAL    . DEXA  08/2005  . DILATION AND CURETTAGE OF UTERUS    . ELECTROCARDIOGRAM  10/15/2006  . ESOPHAGOGASTRODUODENOSCOPY  12/08/2005  . FOOT SURGERY     hammertoe and bunion 05/2017   . Stress Cardiolite  10/21/2005  . sweat gland removal    . WISDOM TOOTH EXTRACTION      SOCIAL HISTORY: Social History   Tobacco Use  . Smoking status: Former Smoker    Packs/day: 2.00    Years: 20.00    Pack years: 40.00    Types: Cigarettes    Last attempt to quit: 03/03/1979    Years since quitting: 39.3  . Smokeless tobacco: Never Used  Substance Use Topics  . Alcohol use: Yes    Comment: rare  . Drug use: No    FAMILY HISTORY: Family History  Problem Relation Age of Onset  . Asthma Mother   . Depression Mother   . Bipolar disorder Mother   . Dementia  Mother   . Arthritis Mother   . Breast cancer Mother        primary  . Colon cancer Mother        mets from breast  . Cancer Mother        colon  . Hypertension Father   . Migraines Father   . Cancer Maternal Aunt        ?  [name] Heart disease Maternal Grandmother   . Cancer Maternal Grandfather        stomach ?    ROS: Review of Systems  Psychiatric/Behavioral: Positive for depression.    PHYSICAL EXAM: Pt in no acute distress  RECENT LABS AND TESTS: BMET    Component Value Date/Time   NA 141 04/27/2018 1219   K 4.0 04/27/2018 1219   CL 104 04/27/2018 1219   CO2 22 04/27/2018 1219   GLUCOSE 78 04/27/2018 1219   GLUCOSE 116 (H) 03/05/2016 0958   BUN 13 04/27/2018 1219   CREATININE 0.67 04/27/2018 1219   CREATININE 0.81 12/28/2011 0853   CALCIUM 9.2 04/27/2018 1219   GFRNONAA 89 04/27/2018 1219   GFRAA 102 04/27/2018 1219   Lab Results  Component Value Date   HGBA1C 5.6 04/27/2018   HGBA1C 6.0 (H) 12/01/2017   HGBA1C 5.7 (H) 07/29/2017   HGBA1C 5.9 03/15/2017   HGBA1C 6.3 (H) 12/23/2016   Lab Results  Component Value Date   INSULIN 3.6 04/27/2018   INSULIN 5.6 12/01/2017   INSULIN 6.8 07/29/2017   INSULIN 20.9 12/23/2016   CBC    Component Value Date/Time   WBC 4.5 04/26/2018 1602   RBC 4.19 04/26/2018 1602   HGB 12.9 04/26/2018 1602   HGB 12.4 12/01/2017 1146   HCT 38.3 04/26/2018 1602  HCT 39.5 12/01/2017 1146   PLT 248.0 04/26/2018 1602   MCV 91.2 04/26/2018 1602   MCV 91 12/01/2017 1146   MCH 28.5 12/01/2017 1146   MCH 29.4 12/28/2011 0853   MCHC 33.7 04/26/2018 1602   RDW 16.7 (H) 04/26/2018 1602   RDW 16.2 (H) 12/01/2017 1146   LYMPHSABS 1.3 04/26/2018 1602   LYMPHSABS 1.2 12/01/2017 1146   MONOABS 0.4 04/26/2018 1602   EOSABS 0.1 04/26/2018 1602   EOSABS 0.1 12/01/2017 1146   BASOSABS 0.0 04/26/2018 1602   BASOSABS 0.0 12/01/2017 1146   Iron/TIBC/Ferritin/ %Sat    Component Value Date/Time   IRON 43 (L) 04/26/2018 1602   IRON 38  12/23/2016 0956   TIBC 249 (L) 04/26/2018 1602   TIBC 271 12/23/2016 0956   FERRITIN 60 04/26/2018 1602   FERRITIN 40 12/23/2016 0956   IRONPCTSAT 17 04/26/2018 1602   Lipid Panel     Component Value Date/Time   CHOL 207 (H) 04/27/2018 1219   TRIG 63 04/27/2018 1219   HDL 72 04/27/2018 1219   CHOLHDL 3 03/05/2016 0958   VLDL 26.0 03/05/2016 0958   LDLCALC 122 (H) 04/27/2018 1219   Hepatic Function Panel     Component Value Date/Time   PROT 7.0 04/27/2018 1219   ALBUMIN 4.0 04/27/2018 1219   AST 18 04/27/2018 1219   ALT 12 04/27/2018 1219   ALKPHOS 61 04/27/2018 1219   BILITOT <0.2 04/27/2018 1219   BILIDIR 0.1 02/15/2015 1010   IBILI 0.2 12/28/2011 0853      Component Value Date/Time   TSH 1.41 04/26/2018 1602   TSH 1.170 12/23/2016 0956   TSH 1.76 03/05/2016 0958    Results for ALYANNAH, SANKS (MRN [id number]) as of 06/30/2018 09:41  Ref. Range 04/27/2018 12:19  Vitamin D, 25-Hydroxy Latest Ref Range: 30.0 - 100.0 ng/mL 36.9    I, Marcille Blanco, CMA, am acting as transcriptionist for Starlyn Skeans, MD I have reviewed the above documentation for accuracy and completeness, and I agree with the above. -[name], MD

## 2018-07-04 ENCOUNTER — Ambulatory Visit (INDEPENDENT_AMBULATORY_CARE_PROVIDER_SITE_OTHER): Payer: Medicare Other | Admitting: Neurology

## 2018-07-04 ENCOUNTER — Other Ambulatory Visit: Payer: Self-pay

## 2018-07-04 DIAGNOSIS — G4733 Obstructive sleep apnea (adult) (pediatric): Secondary | ICD-10-CM | POA: Diagnosis not present

## 2018-07-04 DIAGNOSIS — J301 Allergic rhinitis due to pollen: Secondary | ICD-10-CM

## 2018-07-04 DIAGNOSIS — R519 Headache, unspecified: Secondary | ICD-10-CM

## 2018-07-04 DIAGNOSIS — M25472 Effusion, left ankle: Secondary | ICD-10-CM

## 2018-07-04 DIAGNOSIS — K7581 Nonalcoholic steatohepatitis (NASH): Secondary | ICD-10-CM

## 2018-07-04 DIAGNOSIS — M542 Cervicalgia: Secondary | ICD-10-CM

## 2018-07-04 DIAGNOSIS — R51 Headache: Secondary | ICD-10-CM

## 2018-07-04 DIAGNOSIS — Z9981 Dependence on supplemental oxygen: Secondary | ICD-10-CM

## 2018-07-04 DIAGNOSIS — M25471 Effusion, right ankle: Secondary | ICD-10-CM

## 2018-07-04 DIAGNOSIS — R0601 Orthopnea: Secondary | ICD-10-CM

## 2018-07-04 DIAGNOSIS — Z9989 Dependence on other enabling machines and devices: Secondary | ICD-10-CM

## 2018-07-13 ENCOUNTER — Encounter (INDEPENDENT_AMBULATORY_CARE_PROVIDER_SITE_OTHER): Payer: Self-pay | Admitting: Family Medicine

## 2018-07-13 ENCOUNTER — Other Ambulatory Visit: Payer: Self-pay | Admitting: Neurology

## 2018-07-13 ENCOUNTER — Other Ambulatory Visit: Payer: Self-pay

## 2018-07-13 ENCOUNTER — Telehealth: Payer: Self-pay | Admitting: Neurology

## 2018-07-13 ENCOUNTER — Ambulatory Visit (INDEPENDENT_AMBULATORY_CARE_PROVIDER_SITE_OTHER): Payer: Medicare Other | Admitting: Family Medicine

## 2018-07-13 DIAGNOSIS — Z6832 Body mass index (BMI) 32.0-32.9, adult: Secondary | ICD-10-CM | POA: Diagnosis not present

## 2018-07-13 DIAGNOSIS — F419 Anxiety disorder, unspecified: Secondary | ICD-10-CM | POA: Diagnosis not present

## 2018-07-13 DIAGNOSIS — Z9981 Dependence on supplemental oxygen: Secondary | ICD-10-CM | POA: Insufficient documentation

## 2018-07-13 DIAGNOSIS — E669 Obesity, unspecified: Secondary | ICD-10-CM | POA: Diagnosis not present

## 2018-07-13 DIAGNOSIS — R519 Headache, unspecified: Secondary | ICD-10-CM

## 2018-07-13 DIAGNOSIS — G4733 Obstructive sleep apnea (adult) (pediatric): Secondary | ICD-10-CM

## 2018-07-13 DIAGNOSIS — Z9989 Dependence on other enabling machines and devices: Secondary | ICD-10-CM

## 2018-07-13 DIAGNOSIS — R0601 Orthopnea: Secondary | ICD-10-CM | POA: Insufficient documentation

## 2018-07-13 NOTE — Telephone Encounter (Signed)
I called pt. I advised pt that Dr. Brett Fairy reviewed their sleep study results and found that pt moderate to severe sleep apnea. Dr. Brett Fairy recommends that pt start a auto CPAP for starters even though the patient previously used BiPAP. Insurance will require to see that the patient fails the CPAP before moving to the BiPAP I reviewed PAP compliance expectations with the pt. Pt is agreeable to starting a CPAP. I advised pt that an order will be sent to a DME, [facility], and [facility] will call the pt within about one week after they file with the pt's insurance. [facility] will show the pt how to use the machine, fit for masks, and troubleshoot the CPAP if needed. A follow up appt was made for insurance purposes with Ward Givens on July 1,2020 at 1 pm. Pt verbalized understanding to arrive 15 minutes early and bring their CPAP. A letter with all of this information in it will be mailed to the pt as a reminder. I verified with the pt that the address we have on file is correct. Pt verbalized understanding of results. Pt had no questions at this time but was encouraged to call back if questions arise. I have sent the order to [facility] and have received confirmation that they have received the order.

## 2018-07-13 NOTE — Telephone Encounter (Signed)
Called the patient and made her aware that [facility] informed us insurance will not allow [name] to go backwards from Bipap to CPAP. Patient will have to complete the CPAP titration. Advised the patient I have made the sleep lab aware and placed the order for the patient and once our sleep lab is up and running again we will complete the CPAP titration

## 2018-07-13 NOTE — Telephone Encounter (Signed)
-----   Message from Larey Seat, MD sent at 07/13/2018  8:40 AM EDT ----- Summary & Diagnosis:   The overall AHI is moderate severe at 19.3/ h with a rise to over  40 AHI during REM sleep , and thus qualifying this patient for CPAP  Therapy.  REM sleep is associated with oxygen desaturation and  moderate snoring.   Recommendations:    This patient qualifies for CPAP based on AHI, RDI and RENM  dependent AHI with low oxygen. I will order an auto-titration  capable CPAP set between 5 and 15 cm water with 3 cm EPR and  heated humidity, mask of patient's choice.     Electronically Signed: Larey Seat, MD 07-11-2018

## 2018-07-13 NOTE — Telephone Encounter (Signed)
PER [facility] "Unfortunately, she would have to wait until she can have a titration done as if we go back to CPAP, there has to be documentation on why the patient has been on BIPAP but can now use CPAP. This may make it more difficult for her to get a BIPAP in the future."

## 2018-07-13 NOTE — Procedures (Signed)
Patient Information     First Name: Heather Last Name: [name] ID: [id number]  Birth Date: 1945-08-14 Age: 73 Gender: Female  Referring Provider: [name], MD  BMI: 33.1 (W=198 lb, H=5' 5'')  Neck Circ.:  14 '' Epworth:  21   Sleep Study Information    Study Date: Jul 04, 2018 S/H/A Version: [id number] / 4.1.1528 / 12    Heather [name] is a 73 y.o. female patient sand was seen in a tele-revisit on 05-24-2018 for follow up on OSA with CPAP while on oxygen. Machine is over 2 years old.     05-25-2018: CPAP compliance - download took place on 19 May 2018 we will fax the results at 11:16 AM on 25 March.  The machine is over 26 years old and the patient has been using her CPAP 22 out of 30 days but only 10 of these days over 4 hours.  She is using an easy breeze BiPAP machine with an inspiratory pressure of 11 and expiratory pressure setting of 7 cm water pressure.   Her residual AHI is excellent at 0.4/h of sleep.  She has very few central events.  There is no significant air leakage in the first half of the months but an increased amount of air leaking in the second half of the months. The patient will need to undergo a HST off oxygen and off CPAP to get new supplies and a new machine.      Summary & Diagnosis:    The overall AHI is moderate severe at 19.3? h with a rise to over 40 AHI during REM , and thus qualifying this patient for CPAP therapy. REM sleep is associated with oxygen desaturation and moderate snoring.   Recommendations:      This patient qualifies for CPAP based on AHI, RDI and RENM dependent AHI with low oxygen. I will order an auto-titration capable CPAP set between 5 and 15 cm water with 3 cm EPR and heated humidity, mask of patient's choice.     Electronically Signed:  Larey Seat, MD 07-11-2018              Sleep Summary  Oxygen Saturation Statistics   Start Study Time: End Study Time: Total Recording Time:  10:56:34PM       7:45:22 AM 8 hrs,  22mn  Total Sleep Time % REM of Sleep Time:  7 hrs, 40 min 33.9    Mean: 94 Minimum: 83 Maximum: 100  Mean of Desaturations Nadirs (%):   91  Oxygen Desaturation %:   4-9 10-20 >20 Total  Events Number Total    88  5 94.6 5.4  0 0.0  93 100.0  Oxygen Saturation: <90 <=88 <85 <80 <70  Duration (minutes): Sleep % 23.0 5.0 13.5 0.1 2.9 0.0 0.0 0.0 0.0 0.0     Respiratory Indices      Total Events REM NREM All Night  pRDI:  15  pAHI:  147 ODI:  93  pAHIc:  0  % CSR: 0.0 42.7 40.4 29.9 0.0 9.3 8.5 3.2 0.0 20.6 19.3 12.2 0.0       Pulse Rate Statistics during Sleep (BPM)      Mean: 68 Minimum: 51 Maximum:  94    Indices are calculated using technically valid sleep time of 7 hrs, 36 min. pRDI/pAHI are calculated using oxgen desaturations ? 3%  Body Position Statistics  Position Supine Prone Right Left Non-Supine  Sleep (min) 402.3 1.0 55.5 2.0 58.5  Sleep % 87.3 0.2 12.0 0.4 12.7  pRDI 19.5 N/A 29.4 N/A 28.4  pAHI 18.2 N/A 28.3 N/A 27.4  ODI 11.0 N/A 20.7 N/A 21.0     Snoring Statistics Snoring Level (dB) >40 >50 >60 >70 >80 >Threshold (45)  Sleep (min) 249.8 11.3 4.3 0.6 0.0 26.6  Sleep % 54.2 2.4 0.9 0.1 0.0 5.8    Mean: 42 dB Sleep Stages Chart                              pAHI=19.3                                          Mild              Moderate                    Severe

## 2018-07-13 NOTE — Addendum Note (Signed)
Addended by: Larey Seat on: 07/13/2018 08:40 AM   Modules accepted: Orders

## 2018-07-14 NOTE — Progress Notes (Signed)
Office: [phone]  /  Fax: [phone] TeleHealth Visit:  Heather [name] has verbally consented to this TeleHealth visit today. The patient is located at home, the provider is located at the [facility] office. The participants in this visit include the listed provider, patient, and her spouse [name]. The visit was conducted today via Skype.  HPI:   Chief Complaint: OBESITY Heather [name] is here to discuss her progress with her obesity treatment plan. She is on the  follow a lower carbohydrate, vegetable, and lean protein rich diet plan and is following her eating plan approximately 50 % of the time. She states she is using the elliptical 15 to 30 minutes 3 to 4 times per week. Heather [name] has been going off track more due to increased cravings and temptations. She thinks that she has gained 3 to 4 pounds. Heather [name] is frustrated with herself and is trying to get back on track.  We were unable to weigh the patient today for this TeleHealth visit. She feels as if her weight has fluctuating since her last visit. She has lost 73 lbs since starting treatment with [name].  Anxiety Heather [name] continues to struggle with isolation as she is a social person. She has increased using food to comfort and is frustrated with herself. Her son is home this week, which she enjoys, but she is doing more celebration eating.  ASSESSMENT AND PLAN:  Anxiety  Class 1 obesity with serious comorbidity and body mass index (BMI) of 32.0 to 32.9 in adult, unspecified obesity type  PLAN:  Anxiety Cognitive behavior therapy strategies were discussed with Heather [name] to help her recognize when she is using food for comfort and to redirect her to healthier behaviors. Heather [name] agrees to follow up as directed.  I spent > than 50% of the 25 minute visit on counseling as documented in the note.  Obesity Heather [name] is currently in the action stage of change. As such, her goal is to continue with weight loss efforts. She has agreed to  follow a lower carbohydrate, vegetable, and lean protein rich diet plan. Heather [name] has been instructed to work up to a goal of 150 minutes of combined cardio and strengthening exercise per week for weight loss and overall health benefits. We discussed the following Behavioral Modification Strategies today: dealing with family or coworker sabotage, emotional eating strategies, and ways to avoid boredom eating.  Heather [name] has agreed to follow up with our clinic in 2 weeks. She was informed of the importance of frequent follow up visits to maximize her success with intensive lifestyle modifications for her multiple health conditions.  ALLERGIES: Allergies  Allergen Reactions  . Aspirin   . Coconut Oil   . Lisinopril     REACTION: cough  . Metoprolol Itching  . Peanut-Containing Drug Products   . Penicillins     REACTION: rash  . Prednisone   . Strawberry Extract     MEDICATIONS: Current Outpatient Medications on File Prior to Visit  Medication Sig Dispense Refill  . acetaminophen (TYLENOL) 500 MG tablet Take 650 mg by mouth 3 (three) times daily as needed for pain.     . Calcium Carbonate-Vitamin D (CALCIUM 600 + D PO) Take 2 tablets by mouth daily.      . clopidogrel (PLAVIX) 75 MG tablet Take 1 tablet (75 mg total) by mouth daily. 90 tablet 3  . Ferrous Sulfate (IRON) 325 (65 FE) MG TABS Take 1 tablet by mouth daily.      . fexofenadine (ALLEGRA)  180 MG tablet Take 180 mg by mouth daily.    . furosemide (LASIX) 40 MG tablet Take 1 tablet (40 mg total) by mouth daily. 90 tablet 3  . gabapentin (NEURONTIN) 100 MG capsule Take 2 capsules (200 mg total) by mouth at bedtime. 180 capsule 3  . glucosamine-chondroitin 500-400 MG tablet Take 1 tablet by mouth 2 (two) times daily.      [name] losartan (COZAAR) 25 MG tablet TAKE 1 TABLET EVERY DAY    . metFORMIN (GLUCOPHAGE-XR) 500 MG 24 hr tablet Take 2 tablets (1,000 mg total) by mouth at bedtime. 180 tablet 3  . modafinil (PROVIGIL) 200 MG tablet  modafinil 200 mg tablet    . Multiple Vitamin (MULTIVITAMIN) tablet Take 1 tablet by mouth daily.      . naproxen sodium (ANAPROX) 220 MG tablet Take 220 mg by mouth as needed.    . Omega-3 Fatty Acids (FISH OIL) 1000 MG CAPS Take 1 capsule by mouth daily.    [name] omeprazole (PRILOSEC) 20 MG capsule Take 20 mg by mouth daily as needed.     . potassium chloride SA (KLOR-CON M20) 20 MEQ tablet Take 1 tablet (20 mEq total) by mouth daily. 90 tablet 3  . pyridOXINE (VITAMIN B-6) 100 MG tablet Take 200 mg by mouth daily.    . rosuvastatin (CRESTOR) 40 MG tablet Take 1 tablet (40 mg total) by mouth daily. At night 90 tablet 3  . SF 5000 PLUS 1.1 % CREA dental cream   0  . SUMAtriptan (IMITREX) 20 MG/ACT nasal spray sumatriptan 20 mg/actuation nasal spray    . topiramate (TOPAMAX) 100 MG tablet Take 1 tablet (100 mg total) by mouth 2 (two) times daily. 180 tablet 3  . Turmeric 500 MG TABS Take 2 tablets by mouth daily.    [name] venlafaxine XR (EFFEXOR XR) 37.5 MG 24 hr capsule Take 1 capsule (37.5 mg total) by mouth daily with breakfast. 90 capsule 1  . vitamin E 200 UNIT capsule Take 200 Units by mouth daily.    [name] Zoster Vaccine Live, PF, (ZOSTAVAX) 84665 UNT/0.65ML injection Zostavax (PF) 19,400 unit/0.65 mL subcutaneous suspension     Current Facility-Administered Medications on File Prior to Visit  Medication Dose Route Frequency Provider Last Rate Last Dose  . 0.9 %  sodium chloride infusion  500 mL Intravenous Continuous Nandigam, Venia Minks, MD        PAST MEDICAL HISTORY: Past Medical History:  Diagnosis Date  . Abdominal hernia   . Abnormal Pap smear   . ALLERGIC RHINITIS 10/22/2006  . Allergy   . ANEMIA 12/18/2008  . Arthritis    scoliosis moderate deg changes lumbar Xray 11/04/17   . ASYMPTOMATIC POSTMENOPAUSAL STATUS 11/22/2007  . Back pain   . CAD (coronary artery disease)    in LAD  . Complication of anesthesia   . Degenerative arthritis   . DIABETES MELLITUS, TYPE II 01/13/2007  .  Dyslipidemia   . Fibroid   . Frequent headaches   . GERD 07/20/2007  . GOITER, MULTINODULAR 07/20/2007  . Headache(784.0) 07/20/2007  . HEARING LOSS 11/22/2007  . Hiatal hernia   . Hiatal hernia    large  . History of chicken pox   . Hx of colposcopy with cervical biopsy   . HYPERCHOLESTEROLEMIA 01/13/2007  . Hyperglycemia   . HYPERTENSION 10/22/2006  . Lung nodule    unchanged since 05/26/13   . Migraines   . NASH (nonalcoholic steatohepatitis)   . Nocturnal hypoxemia 02/17/2013  .  Obesity   . Obstructive sleep apnea   . OSA on CPAP    per neurology  . OSTEOARTHRITIS 10/22/2006  . Other chronic nonalcoholic liver disease 2/58/5277  . Sedimentation rate elevation   . Sleep apnea    on cpap  . Urine incontinence   . UTI (urinary tract infection)     PAST SURGICAL HISTORY: Past Surgical History:  Procedure Laterality Date  . BREAST SURGERY  1984   Breast reduction b/l   . CATARACT EXTRACTION, BILATERAL    . DEXA  08/2005  . DILATION AND CURETTAGE OF UTERUS    . ELECTROCARDIOGRAM  10/15/2006  . ESOPHAGOGASTRODUODENOSCOPY  12/08/2005  . FOOT SURGERY     hammertoe and bunion 05/2017   . Stress Cardiolite  10/21/2005  . sweat gland removal    . WISDOM TOOTH EXTRACTION      SOCIAL HISTORY: Social History   Tobacco Use  . Smoking status: Former Smoker    Packs/day: 2.00    Years: 20.00    Pack years: 40.00    Types: Cigarettes    Last attempt to quit: 03/03/1979    Years since quitting: 39.3  . Smokeless tobacco: Never Used  Substance Use Topics  . Alcohol use: Yes    Comment: rare  . Drug use: No    FAMILY HISTORY: Family History  Problem Relation Age of Onset  . Asthma Mother   . Depression Mother   . Bipolar disorder Mother   . Dementia Mother   . Arthritis Mother   . Breast cancer Mother        primary  . Colon cancer Mother        mets from breast  . Cancer Mother        colon  . Hypertension Father   . Migraines Father   . Cancer Maternal Aunt         ?  [name] Heart disease Maternal Grandmother   . Cancer Maternal Grandfather        stomach ?    ROS: Review of Systems  Psychiatric/Behavioral: The patient is nervous/anxious.     PHYSICAL EXAM: Pt in no acute distress  RECENT LABS AND TESTS: BMET    Component Value Date/Time   NA 141 04/27/2018 1219   K 4.0 04/27/2018 1219   CL 104 04/27/2018 1219   CO2 22 04/27/2018 1219   GLUCOSE 78 04/27/2018 1219   GLUCOSE 116 (H) 03/05/2016 0958   BUN 13 04/27/2018 1219   CREATININE 0.67 04/27/2018 1219   CREATININE 0.81 12/28/2011 0853   CALCIUM 9.2 04/27/2018 1219   GFRNONAA 89 04/27/2018 1219   GFRAA 102 04/27/2018 1219   Lab Results  Component Value Date   HGBA1C 5.6 04/27/2018   HGBA1C 6.0 (H) 12/01/2017   HGBA1C 5.7 (H) 07/29/2017   HGBA1C 5.9 03/15/2017   HGBA1C 6.3 (H) 12/23/2016   Lab Results  Component Value Date   INSULIN 3.6 04/27/2018   INSULIN 5.6 12/01/2017   INSULIN 6.8 07/29/2017   INSULIN 20.9 12/23/2016   CBC    Component Value Date/Time   WBC 4.5 04/26/2018 1602   RBC 4.19 04/26/2018 1602   HGB 12.9 04/26/2018 1602   HGB 12.4 12/01/2017 1146   HCT 38.3 04/26/2018 1602   HCT 39.5 12/01/2017 1146   PLT 248.0 04/26/2018 1602   MCV 91.2 04/26/2018 1602   MCV 91 12/01/2017 1146   MCH 28.5 12/01/2017 1146   MCH 29.4 12/28/2011 0853   MCHC 33.7  04/26/2018 1602   RDW 16.7 (H) 04/26/2018 1602   RDW 16.2 (H) 12/01/2017 1146   LYMPHSABS 1.3 04/26/2018 1602   LYMPHSABS 1.2 12/01/2017 1146   MONOABS 0.4 04/26/2018 1602   EOSABS 0.1 04/26/2018 1602   EOSABS 0.1 12/01/2017 1146   BASOSABS 0.0 04/26/2018 1602   BASOSABS 0.0 12/01/2017 1146   Iron/TIBC/Ferritin/ %Sat    Component Value Date/Time   IRON 43 (L) 04/26/2018 1602   IRON 38 12/23/2016 0956   TIBC 249 (L) 04/26/2018 1602   TIBC 271 12/23/2016 0956   FERRITIN 60 04/26/2018 1602   FERRITIN 40 12/23/2016 0956   IRONPCTSAT 17 04/26/2018 1602   Lipid Panel     Component Value Date/Time    CHOL 207 (H) 04/27/2018 1219   TRIG 63 04/27/2018 1219   HDL 72 04/27/2018 1219   CHOLHDL 3 03/05/2016 0958   VLDL 26.0 03/05/2016 0958   LDLCALC 122 (H) 04/27/2018 1219   Hepatic Function Panel     Component Value Date/Time   PROT 7.0 04/27/2018 1219   ALBUMIN 4.0 04/27/2018 1219   AST 18 04/27/2018 1219   ALT 12 04/27/2018 1219   ALKPHOS 61 04/27/2018 1219   BILITOT <0.2 04/27/2018 1219   BILIDIR 0.1 02/15/2015 1010   IBILI 0.2 12/28/2011 0853      Component Value Date/Time   TSH 1.41 04/26/2018 1602   TSH 1.170 12/23/2016 0956   TSH 1.76 03/05/2016 0958   Results for CHASSITY, LUDKE (MRN [id number]) as of 07/14/2018 10:46  Ref. Range 04/27/2018 12:19  Vitamin D, 25-Hydroxy Latest Ref Range: 30.0 - 100.0 ng/mL 36.9     I, Marcille Blanco, CMA, am acting as transcriptionist for Starlyn Skeans, MD I have reviewed the above documentation for accuracy and completeness, and I agree with the above. -[name], MD

## 2018-07-19 ENCOUNTER — Encounter: Payer: Self-pay | Admitting: Podiatry

## 2018-07-19 ENCOUNTER — Other Ambulatory Visit: Payer: Self-pay

## 2018-07-19 ENCOUNTER — Ambulatory Visit (INDEPENDENT_AMBULATORY_CARE_PROVIDER_SITE_OTHER): Payer: Medicare Other | Admitting: Podiatry

## 2018-07-19 ENCOUNTER — Ambulatory Visit: Payer: Medicare Other

## 2018-07-19 VITALS — Temp 97.7°F

## 2018-07-19 DIAGNOSIS — Q828 Other specified congenital malformations of skin: Secondary | ICD-10-CM | POA: Diagnosis not present

## 2018-07-19 DIAGNOSIS — B351 Tinea unguium: Secondary | ICD-10-CM

## 2018-07-19 DIAGNOSIS — M79676 Pain in unspecified toe(s): Secondary | ICD-10-CM | POA: Diagnosis not present

## 2018-07-19 DIAGNOSIS — M2012 Hallux valgus (acquired), left foot: Secondary | ICD-10-CM

## 2018-07-20 ENCOUNTER — Encounter: Payer: Self-pay | Admitting: Podiatry

## 2018-07-20 NOTE — Progress Notes (Signed)
She presents today chief complaint of painful elongated toenails with corns and calluses.  Objective: Toenails are long thick yellow dystrophic-like mycotic pulses remain palpable no open lesions or wounds.  Reactive hyperkeratotic lesion sub-fifth metatarsal bilaterally.  No open lesions or wounds there either.  Assessment: Pain in limb secondary to onychomycosis and reactive hyper keratomas sub-fifth met bilateral.  Plan: Debrided porokeratotic lesion debrided toenails 1 through 5 bilateral.  Follow-up with her in 3 months

## 2018-07-25 NOTE — Progress Notes (Signed)
[name] [address], [place] [address] Phone: [phone] Subjective:   I Heather [name] am serving as a Education administrator for Dr. Hulan Saas. :    CC: Bilateral knee pain  QAS:[id number]   05/25/2018 Bilateral injections given today.  Tolerated the procedure well, I hope that this will continue to give her some benefit.  Has done viscosupplementation in the past with very minimal benefit from time to time and will consider repeating that if necessary.  Encourage weight loss, stay active.  Follow-up again in 4 to 8 weeks  07/26/2018 Heather [name] is a 73 y.o. female coming in with complaint of bilateral knee pain. States that she is suffering due to the weather. Not as bad as they usually are. Achy and uncomfortable.  Starting to affect daily activities.  Rates the severity of pain is 9 out of 10.  Can even keep her up at night.    Past Medical History:  Diagnosis Date  . Abdominal hernia   . Abnormal Pap smear   . ALLERGIC RHINITIS 10/22/2006  . Allergy   . ANEMIA 12/18/2008  . Arthritis    scoliosis moderate deg changes lumbar Xray 11/04/17   . ASYMPTOMATIC POSTMENOPAUSAL STATUS 11/22/2007  . Back pain   . CAD (coronary artery disease)    in LAD  . Complication of anesthesia   . Degenerative arthritis   . DIABETES MELLITUS, TYPE II 01/13/2007  . Dyslipidemia   . Fibroid   . Frequent headaches   . GERD 07/20/2007  . GOITER, MULTINODULAR 07/20/2007  . Headache(784.0) 07/20/2007  . HEARING LOSS 11/22/2007  . Hiatal hernia   . Hiatal hernia    large  . History of chicken pox   . Hx of colposcopy with cervical biopsy   . HYPERCHOLESTEROLEMIA 01/13/2007  . Hyperglycemia   . HYPERTENSION 10/22/2006  . Lung nodule    unchanged since 05/26/13   . Migraines   . NASH (nonalcoholic steatohepatitis)   . Nocturnal hypoxemia 02/17/2013  . Obesity   . Obstructive sleep apnea   . OSA on CPAP    per neurology  . OSTEOARTHRITIS 10/22/2006  . Other  chronic nonalcoholic liver disease 9/89/2119  . Sedimentation rate elevation   . Sleep apnea    on cpap  . Urine incontinence   . UTI (urinary tract infection)    Past Surgical History:  Procedure Laterality Date  . BREAST SURGERY  1984   Breast reduction b/l   . CATARACT EXTRACTION, BILATERAL    . DEXA  08/2005  . DILATION AND CURETTAGE OF UTERUS    . ELECTROCARDIOGRAM  10/15/2006  . ESOPHAGOGASTRODUODENOSCOPY  12/08/2005  . FOOT SURGERY     hammertoe and bunion 05/2017   . Stress Cardiolite  10/21/2005  . sweat gland removal    . WISDOM TOOTH EXTRACTION     Social History   Socioeconomic History  . Marital status: Married    Spouse name: Heather [name]  . Number of children: 2  . Years of education: College  . Highest education level: Not on file  Occupational History  . Occupation: Retired  Scientific laboratory technician  . Financial resource strain: Not on file  . Food insecurity:    Worry: Not on file    Inability: Not on file  . Transportation needs:    Medical: Not on file    Non-medical: Not on file  Tobacco Use  . Smoking status: Former Smoker    Packs/day: 2.00  Years: 20.00    Pack years: 40.00    Types: Cigarettes    Last attempt to quit: 03/03/1979    Years since quitting: 39.4  . Smokeless tobacco: Never Used  Substance and Sexual Activity  . Alcohol use: Yes    Comment: rare  . Drug use: No  . Sexual activity: Yes    Birth control/protection: Surgical  Lifestyle  . Physical activity:    Days per week: Not on file    Minutes per session: Not on file  . Stress: Not on file  Relationships  . Social connections:    Talks on phone: Not on file    Gets together: Not on file    Attends religious service: Not on file    Active member of club or organization: Not on file    Attends meetings of clubs or organizations: Not on file    Relationship status: Not on file  Other Topics Concern  . Not on file  Social History Narrative   Patient is married Heather [name]).   Patient  has two children; 3 pregnancies 2 live births    Patient is retired Designer, jewellery, Chiropodist    Patient has a Financial risk analyst.   Patient is right-handed.   Patient lives at home with family.   Caffeine Use: 2 soda every other day   Former smoker 20+ years ago 2 ppd quit in 50s   No guns, wears seat belt, safe in relationship    Allergies  Allergen Reactions  . Aspirin   . Coconut Oil   . Lisinopril     REACTION: cough  . Metoprolol Itching  . Peanut-Containing Drug Products   . Penicillins     REACTION: rash  . Prednisone   . Strawberry Extract    Family History  Problem Relation Age of Onset  . Asthma Mother   . Depression Mother   . Bipolar disorder Mother   . Dementia Mother   . Arthritis Mother   . Breast cancer Mother        primary  . Colon cancer Mother        mets from breast  . Cancer Mother        colon  . Hypertension Father   . Migraines Father   . Cancer Maternal Aunt        ?  [name] Heart disease Maternal Grandmother   . Cancer Maternal Grandfather        stomach ?    Current Outpatient Medications (Endocrine & Metabolic):  .  metFORMIN (GLUCOPHAGE-XR) 500 MG 24 hr tablet, Take 2 tablets (1,000 mg total) by mouth at bedtime.   Current Outpatient Medications (Cardiovascular):  .  furosemide (LASIX) 40 MG tablet, Take 1 tablet (40 mg total) by mouth daily. [name]  losartan (COZAAR) 25 MG tablet, TAKE 1 TABLET EVERY DAY .  rosuvastatin (CRESTOR) 40 MG tablet, Take 1 tablet (40 mg total) by mouth daily. At night   Current Outpatient Medications (Respiratory):  .  fexofenadine (ALLEGRA) 180 MG tablet, Take 180 mg by mouth daily.   Current Outpatient Medications (Analgesics):  .  acetaminophen (TYLENOL) 500 MG tablet, Take 650 mg by mouth 3 (three) times daily as needed for pain.  .  naproxen sodium (ANAPROX) 220 MG tablet, Take 220 mg by mouth as needed. .  SUMAtriptan (IMITREX) 20 MG/ACT nasal spray,  sumatriptan 20 mg/actuation nasal spray   Current Outpatient Medications (Hematological):  .  clopidogrel (PLAVIX) 75  MG tablet, Take 1 tablet (75 mg total) by mouth daily. .  Ferrous Sulfate (IRON) 325 (65 FE) MG TABS, Take 1 tablet by mouth daily.     Current Outpatient Medications (Other):  [name]  Calcium Carbonate-Vitamin D (CALCIUM 600 + D PO), Take 2 tablets by mouth daily.   [name]  gabapentin (NEURONTIN) 100 MG capsule, Take 2 capsules (200 mg total) by mouth at bedtime. [name]  glucosamine-chondroitin 500-400 MG tablet, Take 1 tablet by mouth 2 (two) times daily.   .  modafinil (PROVIGIL) 200 MG tablet, modafinil 200 mg tablet .  Multiple Vitamin (MULTIVITAMIN) tablet, Take 1 tablet by mouth daily.   .  Omega-3 Fatty Acids (FISH OIL) 1000 MG CAPS, Take 1 capsule by mouth daily. [name]  omeprazole (PRILOSEC) 20 MG capsule, Take 20 mg by mouth daily as needed.  .  potassium chloride SA (KLOR-CON M20) 20 MEQ tablet, Take 1 tablet (20 mEq total) by mouth daily. [name]  pyridOXINE (VITAMIN B-6) 100 MG tablet, Take 200 mg by mouth daily. .  SF 5000 PLUS 1.1 % CREA dental cream,  .  topiramate (TOPAMAX) 100 MG tablet, Take 1 tablet (100 mg total) by mouth 2 (two) times daily. .  Turmeric 500 MG TABS, Take 2 tablets by mouth daily. [name]  venlafaxine XR (EFFEXOR XR) 37.5 MG 24 hr capsule, Take 1 capsule (37.5 mg total) by mouth daily with breakfast. .  vitamin E 200 UNIT capsule, Take 200 Units by mouth daily. [name]  Zoster Vaccine Live, PF, (ZOSTAVAX) 03704 UNT/0.65ML injection, Zostavax (PF) 19,400 unit/0.65 mL subcutaneous suspension  Current Facility-Administered Medications (Other):  .  0.9 %  sodium chloride infusion    Past medical history, social, surgical and family history all reviewed in electronic medical record.  No pertanent information unless stated regarding to the chief complaint.   Review of Systems:  No headache, visual changes, nausea, vomiting, diarrhea, constipation, dizziness, abdominal  pain, skin rash, fevers, chills, night sweats, weight loss, swollen lymph nodes,chest pain, shortness of breath, mood changes.  Positive muscle aches and body aches  Objective  Blood pressure 110/72, pulse 72, height 5' 5"  (1.651 m), weight 197 lb (89.4 kg), SpO2 97 %.    General: No apparent distress alert and oriented x3 mood and affect normal, dressed appropriately.  HEENT: Pupils equal, extraocular movements intact  Respiratory: Patient's speak in full sentences and does not appear short of breath  Cardiovascular: No lower extremity edema, non tender, no erythema  Skin: Warm dry intact with no signs of infection or rash on extremities or on axial skeleton.  Abdomen: Soft nontender  Neuro: Cranial nerves II through XII are intact, neurovascularly intact in all extremities with 2+ DTRs and 2+ pulses.  Lymph: No lymphadenopathy of posterior or anterior cervical chain or axillae bilaterally.  Gait severely antalgic MSK:  Non tender with full range of motion and good stability and symmetric strength and tone of shoulders, elbows, wrist, hip and ankles bilaterally.  Knee: Bilateral valgus deformity noted. Large thigh to calf ratio.  Tender to palpation over medial and PF joint line.  ROM full in flexion and extension and lower leg rotation. instability with valgus force.  painful patellar compression. Patellar glide with moderate crepitus. Patellar and quadriceps tendons unremarkable. Hamstring and quadriceps strength is normal.  After informed written and verbal consent, patient was seated on exam table. Right knee was prepped with alcohol swab and utilizing anterolateral approach, patient's right knee space was injected with 4:1  marcaine 0.5%:  Kenalog 30m/dL. Patient tolerated the procedure well without immediate complications.  After informed written and verbal consent, patient was seated on exam table. Left knee was prepped with alcohol swab and utilizing anterolateral approach,  patient's left knee space was injected with 4:1  marcaine 0.5%: Kenalog 429mdL. Patient tolerated the procedure well without immediate complications.    Impression and Recommendations:     This case required medical decision making of moderate complexity. The above documentation has been reviewed and is accurate and complete ZaLyndal PulleyDO       Note: This dictation was prepared with Dragon dictation along with smaller phrase technology. Any transcriptional errors that result from this process are unintentional.

## 2018-07-26 ENCOUNTER — Encounter: Payer: Self-pay | Admitting: Family Medicine

## 2018-07-26 ENCOUNTER — Ambulatory Visit (INDEPENDENT_AMBULATORY_CARE_PROVIDER_SITE_OTHER): Payer: Medicare Other | Admitting: Family Medicine

## 2018-07-26 ENCOUNTER — Other Ambulatory Visit: Payer: Self-pay

## 2018-07-26 DIAGNOSIS — M170 Bilateral primary osteoarthritis of knee: Secondary | ICD-10-CM | POA: Diagnosis not present

## 2018-07-26 NOTE — Assessment & Plan Note (Signed)
Patient given injections again today.  Patient wants to avoid any surgical intervention.  Could be a candidate for Visco supplementation but has not had significant benefit with that over the years.  Patient is unwilling to wear the custom bracing on a regular basis.  We discussed home exercise, icing regimen, which activities to do which wants to avoid.  Patient will start to increase activity as tolerated.  Follow-up again 4 to 8 weeks

## 2018-07-26 NOTE — Patient Instructions (Signed)
Good to see you  Ice is your friend Stay active I am here if you need me Stay safe and see me again in 3 months or call sooner

## 2018-07-28 ENCOUNTER — Other Ambulatory Visit: Payer: Self-pay

## 2018-07-28 ENCOUNTER — Ambulatory Visit (INDEPENDENT_AMBULATORY_CARE_PROVIDER_SITE_OTHER): Payer: Medicare Other | Admitting: Family Medicine

## 2018-07-28 ENCOUNTER — Encounter (INDEPENDENT_AMBULATORY_CARE_PROVIDER_SITE_OTHER): Payer: Self-pay | Admitting: Family Medicine

## 2018-07-28 DIAGNOSIS — Z6832 Body mass index (BMI) 32.0-32.9, adult: Secondary | ICD-10-CM | POA: Diagnosis not present

## 2018-07-28 DIAGNOSIS — F418 Other specified anxiety disorders: Secondary | ICD-10-CM

## 2018-07-28 DIAGNOSIS — E669 Obesity, unspecified: Secondary | ICD-10-CM | POA: Diagnosis not present

## 2018-08-01 NOTE — Progress Notes (Signed)
Office: [phone]  /  Fax: [phone] TeleHealth Visit:  Heather [name] has verbally consented to this TeleHealth visit today. The patient is located at home, the provider is located at the [employer] [facility] office. The participants in this visit include the listed provider and patient. Cedra was unable to use Skype today and the telehealth visit was conducted via telephone.  HPI:   Chief Complaint: OBESITY Heather [name] is here to discuss her progress with her obesity treatment plan. She is on the Category 2 plan and lower carbohydrate plan and is following her eating plan approximately 75 % of the time. She states she is using the elliptical 15 to 30 minutes 4 times per week. Heather [name] feels that she has done well maintaining her weight for the last few weeks. She is doing a combination of Category 2 and low carb. She has done well with portion control and making smarter choices.  We were unable to weigh the patient today for this TeleHealth visit. She feels as if she has maintained weight since her last visit. She has lost 73 lbs since starting treatment with [name].  Depression with emotional eating behaviors Heather [name]'s mood has improved since her knee pain has improved in the last week. She states that she is feeling well, being more active, and has a positive outlook. She has been working on behavior modification techniques to help reduce her emotional eating and has been somewhat successful.   Depression screen [facility] 2/9 01/05/2018 12/23/2016  Decreased Interest 0 2  Down, Depressed, Hopeless 0 1  PHQ - 2 Score 0 3  Altered sleeping - 1  Tired, decreased energy - 3  Change in appetite - 1  Feeling bad or failure about yourself  - 0  Trouble concentrating - 0  Moving slowly or fidgety/restless - 2  Suicidal thoughts - 0  PHQ-9 Score - 10  Difficult doing work/chores - Somewhat difficult  Some recent data might be hidden   ASSESSMENT AND PLAN:  Depression with anxiety  Class 1  obesity with serious comorbidity and body mass index (BMI) of 32.0 to 32.9 in adult, unspecified obesity type  PLAN:  Depression with Anxiety We discussed behavior modification techniques today to help Heather [name] deal with her emotional eating and depression. She has agreed to take Effexor as prescribed and agreed to follow up as directed in 2 weeks.  I spent > than 50% of the 25 minute visit on counseling as documented in the note.  Obesity Heather [name] is currently in the action stage of change. As such, her goal is to continue with weight loss efforts. She has agreed to follow the Category 2 plan. Heather [name] has been instructed to work up to a goal of 150 minutes of combined cardio and strengthening exercise per week for weight loss and overall health benefits. We discussed the following Behavioral Modification Strategies today: increasing lean protein intake, no skipping meals, and ways to avoid boredom eating.  Heather [name] has agreed to follow up with our clinic in 2 weeks. She was informed of the importance of frequent follow up visits to maximize her success with intensive lifestyle modifications for her multiple health conditions.  ALLERGIES: Allergies  Allergen Reactions  . Aspirin   . Coconut Oil   . Lisinopril     REACTION: cough  . Metoprolol Itching  . Peanut-Containing Drug Products   . Penicillins     REACTION: rash  . Prednisone   . Strawberry Extract     MEDICATIONS: Current  Outpatient Medications on File Prior to Visit  Medication Sig Dispense Refill  . acetaminophen (TYLENOL) 500 MG tablet Take 650 mg by mouth 3 (three) times daily as needed for pain.     . Calcium Carbonate-Vitamin D (CALCIUM 600 + D PO) Take 2 tablets by mouth daily.      . clopidogrel (PLAVIX) 75 MG tablet Take 1 tablet (75 mg total) by mouth daily. 90 tablet 3  . Ferrous Sulfate (IRON) 325 (65 FE) MG TABS Take 1 tablet by mouth daily.      . fexofenadine (ALLEGRA) 180 MG tablet Take 180 mg by mouth daily.     . furosemide (LASIX) 40 MG tablet Take 1 tablet (40 mg total) by mouth daily. 90 tablet 3  . gabapentin (NEURONTIN) 100 MG capsule Take 2 capsules (200 mg total) by mouth at bedtime. 180 capsule 3  . glucosamine-chondroitin 500-400 MG tablet Take 1 tablet by mouth 2 (two) times daily.      [name] losartan (COZAAR) 25 MG tablet TAKE 1 TABLET EVERY DAY    . metFORMIN (GLUCOPHAGE-XR) 500 MG 24 hr tablet Take 2 tablets (1,000 mg total) by mouth at bedtime. 180 tablet 3  . modafinil (PROVIGIL) 200 MG tablet modafinil 200 mg tablet    . Multiple Vitamin (MULTIVITAMIN) tablet Take 1 tablet by mouth daily.      . naproxen sodium (ANAPROX) 220 MG tablet Take 220 mg by mouth as needed.    . Omega-3 Fatty Acids (FISH OIL) 1000 MG CAPS Take 1 capsule by mouth daily.    [name] omeprazole (PRILOSEC) 20 MG capsule Take 20 mg by mouth daily as needed.     . potassium chloride SA (KLOR-CON M20) 20 MEQ tablet Take 1 tablet (20 mEq total) by mouth daily. 90 tablet 3  . pyridOXINE (VITAMIN B-6) 100 MG tablet Take 200 mg by mouth daily.    . rosuvastatin (CRESTOR) 40 MG tablet Take 1 tablet (40 mg total) by mouth daily. At night 90 tablet 3  . SF 5000 PLUS 1.1 % CREA dental cream   0  . SUMAtriptan (IMITREX) 20 MG/ACT nasal spray sumatriptan 20 mg/actuation nasal spray    . topiramate (TOPAMAX) 100 MG tablet Take 1 tablet (100 mg total) by mouth 2 (two) times daily. 180 tablet 3  . Turmeric 500 MG TABS Take 2 tablets by mouth daily.    [name] venlafaxine XR (EFFEXOR XR) 37.5 MG 24 hr capsule Take 1 capsule (37.5 mg total) by mouth daily with breakfast. 90 capsule 1  . vitamin E 200 UNIT capsule Take 200 Units by mouth daily.    [name] Zoster Vaccine Live, PF, (ZOSTAVAX) 40981 UNT/0.65ML injection Zostavax (PF) 19,400 unit/0.65 mL subcutaneous suspension     Current Facility-Administered Medications on File Prior to Visit  Medication Dose Route Frequency Provider Last Rate Last Dose  . 0.9 %  sodium chloride infusion  500 mL  Intravenous Continuous Nandigam, Venia Minks, MD        PAST MEDICAL HISTORY: Past Medical History:  Diagnosis Date  . Abdominal hernia   . Abnormal Pap smear   . ALLERGIC RHINITIS 10/22/2006  . Allergy   . ANEMIA 12/18/2008  . Arthritis    scoliosis moderate deg changes lumbar Xray 11/04/17   . ASYMPTOMATIC POSTMENOPAUSAL STATUS 11/22/2007  . Back pain   . CAD (coronary artery disease)    in LAD  . Complication of anesthesia   . Degenerative arthritis   . DIABETES MELLITUS, TYPE II 01/13/2007  .  Dyslipidemia   . Fibroid   . Frequent headaches   . GERD 07/20/2007  . GOITER, MULTINODULAR 07/20/2007  . Headache(784.0) 07/20/2007  . HEARING LOSS 11/22/2007  . Hiatal hernia   . Hiatal hernia    large  . History of chicken pox   . Hx of colposcopy with cervical biopsy   . HYPERCHOLESTEROLEMIA 01/13/2007  . Hyperglycemia   . HYPERTENSION 10/22/2006  . Lung nodule    unchanged since 05/26/13   . Migraines   . NASH (nonalcoholic steatohepatitis)   . Nocturnal hypoxemia 02/17/2013  . Obesity   . Obstructive sleep apnea   . OSA on CPAP    per neurology  . OSTEOARTHRITIS 10/22/2006  . Other chronic nonalcoholic liver disease 9/38/1017  . Sedimentation rate elevation   . Sleep apnea    on cpap  . Urine incontinence   . UTI (urinary tract infection)     PAST SURGICAL HISTORY: Past Surgical History:  Procedure Laterality Date  . BREAST SURGERY  1984   Breast reduction b/l   . CATARACT EXTRACTION, BILATERAL    . DEXA  08/2005  . DILATION AND CURETTAGE OF UTERUS    . ELECTROCARDIOGRAM  10/15/2006  . ESOPHAGOGASTRODUODENOSCOPY  12/08/2005  . FOOT SURGERY     hammertoe and bunion 05/2017   . Stress Cardiolite  10/21/2005  . sweat gland removal    . WISDOM TOOTH EXTRACTION      SOCIAL HISTORY: Social History   Tobacco Use  . Smoking status: Former Smoker    Packs/day: 2.00    Years: 20.00    Pack years: 40.00    Types: Cigarettes    Last attempt to quit: 03/03/1979    Years  since quitting: 39.4  . Smokeless tobacco: Never Used  Substance Use Topics  . Alcohol use: Yes    Comment: rare  . Drug use: No    FAMILY HISTORY: Family History  Problem Relation Age of Onset  . Asthma Mother   . Depression Mother   . Bipolar disorder Mother   . Dementia Mother   . Arthritis Mother   . Breast cancer Mother        primary  . Colon cancer Mother        mets from breast  . Cancer Mother        colon  . Hypertension Father   . Migraines Father   . Cancer Maternal Aunt        ?  [name] Heart disease Maternal Grandmother   . Cancer Maternal Grandfather        stomach ?    ROS: Review of Systems  Musculoskeletal:       Positive for knee pain.  Psychiatric/Behavioral: Positive for depression. The patient is nervous/anxious.     PHYSICAL EXAM: Pt in no acute distress  RECENT LABS AND TESTS: BMET    Component Value Date/Time   NA 141 04/27/2018 1219   K 4.0 04/27/2018 1219   CL 104 04/27/2018 1219   CO2 22 04/27/2018 1219   GLUCOSE 78 04/27/2018 1219   GLUCOSE 116 (H) 03/05/2016 0958   BUN 13 04/27/2018 1219   CREATININE 0.67 04/27/2018 1219   CREATININE 0.81 12/28/2011 0853   CALCIUM 9.2 04/27/2018 1219   GFRNONAA 89 04/27/2018 1219   GFRAA 102 04/27/2018 1219   Lab Results  Component Value Date   HGBA1C 5.6 04/27/2018   HGBA1C 6.0 (H) 12/01/2017   HGBA1C 5.7 (H) 07/29/2017   HGBA1C 5.9 03/15/2017  HGBA1C 6.3 (H) 12/23/2016   Lab Results  Component Value Date   INSULIN 3.6 04/27/2018   INSULIN 5.6 12/01/2017   INSULIN 6.8 07/29/2017   INSULIN 20.9 12/23/2016   CBC    Component Value Date/Time   WBC 4.5 04/26/2018 1602   RBC 4.19 04/26/2018 1602   HGB 12.9 04/26/2018 1602   HGB 12.4 12/01/2017 1146   HCT 38.3 04/26/2018 1602   HCT 39.5 12/01/2017 1146   PLT 248.0 04/26/2018 1602   MCV 91.2 04/26/2018 1602   MCV 91 12/01/2017 1146   MCH 28.5 12/01/2017 1146   MCH 29.4 12/28/2011 0853   MCHC 33.7 04/26/2018 1602   RDW 16.7 (H)  04/26/2018 1602   RDW 16.2 (H) 12/01/2017 1146   LYMPHSABS 1.3 04/26/2018 1602   LYMPHSABS 1.2 12/01/2017 1146   MONOABS 0.4 04/26/2018 1602   EOSABS 0.1 04/26/2018 1602   EOSABS 0.1 12/01/2017 1146   BASOSABS 0.0 04/26/2018 1602   BASOSABS 0.0 12/01/2017 1146   Iron/TIBC/Ferritin/ %Sat    Component Value Date/Time   IRON 43 (L) 04/26/2018 1602   IRON 38 12/23/2016 0956   TIBC 249 (L) 04/26/2018 1602   TIBC 271 12/23/2016 0956   FERRITIN 60 04/26/2018 1602   FERRITIN 40 12/23/2016 0956   IRONPCTSAT 17 04/26/2018 1602   Lipid Panel     Component Value Date/Time   CHOL 207 (H) 04/27/2018 1219   TRIG 63 04/27/2018 1219   HDL 72 04/27/2018 1219   CHOLHDL 3 03/05/2016 0958   VLDL 26.0 03/05/2016 0958   LDLCALC 122 (H) 04/27/2018 1219   Hepatic Function Panel     Component Value Date/Time   PROT 7.0 04/27/2018 1219   ALBUMIN 4.0 04/27/2018 1219   AST 18 04/27/2018 1219   ALT 12 04/27/2018 1219   ALKPHOS 61 04/27/2018 1219   BILITOT <0.2 04/27/2018 1219   BILIDIR 0.1 02/15/2015 1010   IBILI 0.2 12/28/2011 0853      Component Value Date/Time   TSH 1.41 04/26/2018 1602   TSH 1.170 12/23/2016 0956   TSH 1.76 03/05/2016 0958    Results for Heather [name], BRING (MRN [id number]) as of 08/01/2018 08:17  Ref. Range 04/27/2018 12:19  Vitamin D, 25-Hydroxy Latest Ref Range: 30.0 - 100.0 ng/mL 36.9    I, Marcille Blanco, CMA, am acting as transcriptionist for Starlyn Skeans, MD I have reviewed the above documentation for accuracy and completeness, and I agree with the above. -[name], MD

## 2018-08-02 DIAGNOSIS — F32A Depression, unspecified: Secondary | ICD-10-CM | POA: Insufficient documentation

## 2018-08-02 DIAGNOSIS — F418 Other specified anxiety disorders: Secondary | ICD-10-CM | POA: Insufficient documentation

## 2018-08-11 ENCOUNTER — Encounter (INDEPENDENT_AMBULATORY_CARE_PROVIDER_SITE_OTHER): Payer: Self-pay | Admitting: Family Medicine

## 2018-08-11 ENCOUNTER — Other Ambulatory Visit: Payer: Self-pay

## 2018-08-11 ENCOUNTER — Ambulatory Visit (INDEPENDENT_AMBULATORY_CARE_PROVIDER_SITE_OTHER): Payer: Medicare Other | Admitting: Family Medicine

## 2018-08-11 DIAGNOSIS — Z6841 Body Mass Index (BMI) 40.0 and over, adult: Secondary | ICD-10-CM | POA: Diagnosis not present

## 2018-08-11 DIAGNOSIS — I10 Essential (primary) hypertension: Secondary | ICD-10-CM | POA: Diagnosis not present

## 2018-08-16 NOTE — Progress Notes (Signed)
Office: [phone]  /  Fax: [phone] TeleHealth Visit:  Heather [name] has verbally consented to this TeleHealth visit today. The patient is located at home, the provider is located at the [employer] [facility] office. The participants in this visit include the listed provider and patient and any and all parties involved. The visit was conducted today via telephone. Heather [name] was unable to use realtime audiovisual technology today (Skype failed) and the telehealth visit was conducted via telephone.  HPI:   Chief Complaint: OBESITY Chosen is here to discuss her progress with her obesity treatment plan. She is on the lower carbohydrate, vegetable and lean protein rich diet plan and is following her eating plan approximately 50 % of the time. She states she is exercising with the Elliptical 30 minutes 5 times per week. Heather [name] continues to do well maintaining her weight loss and she has may have lost another pound. She is trying to increase protein and vegetables. Heather [name] is going to have family visit this weekend and she will have extra temptations at that time. We were unable to weigh the patient today for this TeleHealth visit. She feels as if she has lost weight since her last visit. She has lost 73 to 74 lbs since starting treatment with [name].  Hypertension Heather [name] is a 73 y.o. female with hypertension. She is checking her blood pressure at home. Heather [name] denies chest pain, headache or dizziness. Heather [name] is doing well with diet and medications. She is working weight loss to help control her blood pressure with the goal of decreasing her risk of heart attack and stroke. Heather [name] blood pressure is well controlled.  ASSESSMENT AND PLAN:  Essential hypertension  Class 3 severe obesity with serious comorbidity and body mass index (BMI) of 40.0 to 44.9 in adult, unspecified obesity type (Heather [name])  PLAN:  Hypertension We discussed sodium restriction, working on diet, healthy weight  loss, and a regular exercise program as the means to achieve improved blood pressure control. Heather [name] agreed with this plan and agreed to follow up as directed. We will continue to monitor her blood pressure as well as her progress with the above lifestyle modifications. She will continue her medications as prescribed and will watch for signs of hypotension as she continues her lifestyle modifications.  I spent > than 50% of the 25 minute visit on counseling as documented in the note.  Obesity Heather [name] is currently in the action stage of change. As such, her goal is to continue with weight loss efforts She has agreed to portion control better and make smarter food choices, such as increase vegetables and decrease simple carbohydrates  Heather [name] has been instructed to work up to a goal of 150 minutes of combined cardio and strengthening exercise per week for weight loss and overall health benefits. We discussed the following Behavioral Modification Strategies today: keeping healthy foods in the home, increasing lean protein intake, decreasing simple carbohydrates, increasing vegetables and dealing with family or coworker sabotage  Heather [name] has agreed to follow up with our clinic in 2 to 3 weeks. She was informed of the importance of frequent follow up visits to maximize her success with intensive lifestyle modifications for her multiple health conditions.  ALLERGIES: Allergies  Allergen Reactions  . Aspirin   . Coconut Oil   . Lisinopril     REACTION: cough  . Metoprolol Itching  . Peanut-Containing Drug Products   . Penicillins     REACTION: rash  . Prednisone   .  Strawberry Extract     MEDICATIONS: Current Outpatient Medications on File Prior to Visit  Medication Sig Dispense Refill  . acetaminophen (TYLENOL) 500 MG tablet Take 650 mg by mouth 3 (three) times daily as needed for pain.     . Calcium Carbonate-Vitamin D (CALCIUM 600 + D PO) Take 2 tablets by mouth daily.      . clopidogrel  (PLAVIX) 75 MG tablet Take 1 tablet (75 mg total) by mouth daily. 90 tablet 3  . Ferrous Sulfate (IRON) 325 (65 FE) MG TABS Take 1 tablet by mouth daily.      . fexofenadine (ALLEGRA) 180 MG tablet Take 180 mg by mouth daily.    . furosemide (LASIX) 40 MG tablet Take 1 tablet (40 mg total) by mouth daily. 90 tablet 3  . gabapentin (NEURONTIN) 100 MG capsule Take 2 capsules (200 mg total) by mouth at bedtime. 180 capsule 3  . glucosamine-chondroitin 500-400 MG tablet Take 1 tablet by mouth 2 (two) times daily.      [name] losartan (COZAAR) 25 MG tablet TAKE 1 TABLET EVERY DAY    . metFORMIN (GLUCOPHAGE-XR) 500 MG 24 hr tablet Take 2 tablets (1,000 mg total) by mouth at bedtime. 180 tablet 3  . modafinil (PROVIGIL) 200 MG tablet modafinil 200 mg tablet    . Multiple Vitamin (MULTIVITAMIN) tablet Take 1 tablet by mouth daily.      . naproxen sodium (ANAPROX) 220 MG tablet Take 220 mg by mouth as needed.    . Omega-3 Fatty Acids (FISH OIL) 1000 MG CAPS Take 1 capsule by mouth daily.    [name] omeprazole (PRILOSEC) 20 MG capsule Take 20 mg by mouth daily as needed.     . potassium chloride SA (KLOR-CON M20) 20 MEQ tablet Take 1 tablet (20 mEq total) by mouth daily. 90 tablet 3  . pyridOXINE (VITAMIN B-6) 100 MG tablet Take 200 mg by mouth daily.    . rosuvastatin (CRESTOR) 40 MG tablet Take 1 tablet (40 mg total) by mouth daily. At night 90 tablet 3  . SF 5000 PLUS 1.1 % CREA dental cream   0  . SUMAtriptan (IMITREX) 20 MG/ACT nasal spray sumatriptan 20 mg/actuation nasal spray    . topiramate (TOPAMAX) 100 MG tablet Take 1 tablet (100 mg total) by mouth 2 (two) times daily. 180 tablet 3  . Turmeric 500 MG TABS Take 2 tablets by mouth daily.    [name] venlafaxine XR (EFFEXOR XR) 37.5 MG 24 hr capsule Take 1 capsule (37.5 mg total) by mouth daily with breakfast. 90 capsule 1  . vitamin E 200 UNIT capsule Take 200 Units by mouth daily.    [name] Zoster Vaccine Live, PF, (ZOSTAVAX) 02637 UNT/0.65ML injection Zostavax (PF)  19,400 unit/0.65 mL subcutaneous suspension     Current Facility-Administered Medications on File Prior to Visit  Medication Dose Route Frequency Provider Last Rate Last Dose  . 0.9 %  sodium chloride infusion  500 mL Intravenous Continuous Nandigam, Venia Minks, MD        PAST MEDICAL HISTORY: Past Medical History:  Diagnosis Date  . Abdominal hernia   . Abnormal Pap smear   . ALLERGIC RHINITIS 10/22/2006  . Allergy   . ANEMIA 12/18/2008  . Arthritis    scoliosis moderate deg changes lumbar Xray 11/04/17   . ASYMPTOMATIC POSTMENOPAUSAL STATUS 11/22/2007  . Back pain   . CAD (coronary artery disease)    in LAD  . Complication of anesthesia   . Degenerative arthritis   .  DIABETES MELLITUS, TYPE II 01/13/2007  . Dyslipidemia   . Fibroid   . Frequent headaches   . GERD 07/20/2007  . GOITER, MULTINODULAR 07/20/2007  . Headache(784.0) 07/20/2007  . HEARING LOSS 11/22/2007  . Hiatal hernia   . Hiatal hernia    large  . History of chicken pox   . Hx of colposcopy with cervical biopsy   . HYPERCHOLESTEROLEMIA 01/13/2007  . Hyperglycemia   . HYPERTENSION 10/22/2006  . Lung nodule    unchanged since 05/26/13   . Migraines   . NASH (nonalcoholic steatohepatitis)   . Nocturnal hypoxemia 02/17/2013  . Obesity   . Obstructive sleep apnea   . OSA on CPAP    per neurology  . OSTEOARTHRITIS 10/22/2006  . Other chronic nonalcoholic liver disease 0/16/0109  . Sedimentation rate elevation   . Sleep apnea    on cpap  . Urine incontinence   . UTI (urinary tract infection)     PAST SURGICAL HISTORY: Past Surgical History:  Procedure Laterality Date  . BREAST SURGERY  1984   Breast reduction b/l   . CATARACT EXTRACTION, BILATERAL    . DEXA  08/2005  . DILATION AND CURETTAGE OF UTERUS    . ELECTROCARDIOGRAM  10/15/2006  . ESOPHAGOGASTRODUODENOSCOPY  12/08/2005  . FOOT SURGERY     hammertoe and bunion 05/2017   . Stress Cardiolite  10/21/2005  . sweat gland removal    . WISDOM TOOTH  EXTRACTION      SOCIAL HISTORY: Social History   Tobacco Use  . Smoking status: Former Smoker    Packs/day: 2.00    Years: 20.00    Pack years: 40.00    Types: Cigarettes    Quit date: 03/03/1979    Years since quitting: 39.4  . Smokeless tobacco: Never Used  Substance Use Topics  . Alcohol use: Yes    Comment: rare  . Drug use: No    FAMILY HISTORY: Family History  Problem Relation Age of Onset  . Asthma Mother   . Depression Mother   . Bipolar disorder Mother   . Dementia Mother   . Arthritis Mother   . Breast cancer Mother        primary  . Colon cancer Mother        mets from breast  . Cancer Mother        colon  . Hypertension Father   . Migraines Father   . Cancer Maternal Aunt        ?  [name] Heart disease Maternal Grandmother   . Cancer Maternal Grandfather        stomach ?    ROS: Review of Systems  Constitutional: Positive for weight loss.  Cardiovascular: Negative for chest pain.  Neurological: Negative for dizziness and headaches.    PHYSICAL EXAM: Pt in no acute distress  RECENT LABS AND TESTS: BMET    Component Value Date/Time   NA 141 04/27/2018 1219   K 4.0 04/27/2018 1219   CL 104 04/27/2018 1219   CO2 22 04/27/2018 1219   GLUCOSE 78 04/27/2018 1219   GLUCOSE 116 (H) 03/05/2016 0958   BUN 13 04/27/2018 1219   CREATININE 0.67 04/27/2018 1219   CREATININE 0.81 12/28/2011 0853   CALCIUM 9.2 04/27/2018 1219   GFRNONAA 89 04/27/2018 1219   GFRAA 102 04/27/2018 1219   Lab Results  Component Value Date   HGBA1C 5.6 04/27/2018   HGBA1C 6.0 (H) 12/01/2017   HGBA1C 5.7 (H) 07/29/2017   HGBA1C  5.9 03/15/2017   HGBA1C 6.3 (H) 12/23/2016   Lab Results  Component Value Date   INSULIN 3.6 04/27/2018   INSULIN 5.6 12/01/2017   INSULIN 6.8 07/29/2017   INSULIN 20.9 12/23/2016   CBC    Component Value Date/Time   WBC 4.5 04/26/2018 1602   RBC 4.19 04/26/2018 1602   HGB 12.9 04/26/2018 1602   HGB 12.4 12/01/2017 1146   HCT 38.3  04/26/2018 1602   HCT 39.5 12/01/2017 1146   PLT 248.0 04/26/2018 1602   MCV 91.2 04/26/2018 1602   MCV 91 12/01/2017 1146   MCH 28.5 12/01/2017 1146   MCH 29.4 12/28/2011 0853   MCHC 33.7 04/26/2018 1602   RDW 16.7 (H) 04/26/2018 1602   RDW 16.2 (H) 12/01/2017 1146   LYMPHSABS 1.3 04/26/2018 1602   LYMPHSABS 1.2 12/01/2017 1146   MONOABS 0.4 04/26/2018 1602   EOSABS 0.1 04/26/2018 1602   EOSABS 0.1 12/01/2017 1146   BASOSABS 0.0 04/26/2018 1602   BASOSABS 0.0 12/01/2017 1146   Iron/TIBC/Ferritin/ %Sat    Component Value Date/Time   IRON 43 (L) 04/26/2018 1602   IRON 38 12/23/2016 0956   TIBC 249 (L) 04/26/2018 1602   TIBC 271 12/23/2016 0956   FERRITIN 60 04/26/2018 1602   FERRITIN 40 12/23/2016 0956   IRONPCTSAT 17 04/26/2018 1602   Lipid Panel     Component Value Date/Time   CHOL 207 (H) 04/27/2018 1219   TRIG 63 04/27/2018 1219   HDL 72 04/27/2018 1219   CHOLHDL 3 03/05/2016 0958   VLDL 26.0 03/05/2016 0958   LDLCALC 122 (H) 04/27/2018 1219   Hepatic Function Panel     Component Value Date/Time   PROT 7.0 04/27/2018 1219   ALBUMIN 4.0 04/27/2018 1219   AST 18 04/27/2018 1219   ALT 12 04/27/2018 1219   ALKPHOS 61 04/27/2018 1219   BILITOT <0.2 04/27/2018 1219   BILIDIR 0.1 02/15/2015 1010   IBILI 0.2 12/28/2011 0853      Component Value Date/Time   TSH 1.41 04/26/2018 1602   TSH 1.170 12/23/2016 0956   TSH 1.76 03/05/2016 0958     Ref. Range 04/27/2018 12:19  Vitamin D, 25-Hydroxy Latest Ref Range: 30.0 - 100.0 ng/mL 36.9    I, Doreene Nest, am acting as Location manager for [name], MD I have reviewed the above documentation for accuracy and completeness, and I agree with the above. -[name], MD

## 2018-08-18 ENCOUNTER — Ambulatory Visit: Payer: Medicare Other | Admitting: Neurology

## 2018-08-25 ENCOUNTER — Encounter (INDEPENDENT_AMBULATORY_CARE_PROVIDER_SITE_OTHER): Payer: Self-pay | Admitting: Family Medicine

## 2018-08-25 ENCOUNTER — Telehealth (INDEPENDENT_AMBULATORY_CARE_PROVIDER_SITE_OTHER): Payer: Medicare Other | Admitting: Family Medicine

## 2018-08-25 ENCOUNTER — Encounter: Payer: Self-pay | Admitting: Internal Medicine

## 2018-08-25 ENCOUNTER — Encounter: Payer: Self-pay | Admitting: Family Medicine

## 2018-08-25 ENCOUNTER — Other Ambulatory Visit: Payer: Self-pay

## 2018-08-25 DIAGNOSIS — Z6841 Body Mass Index (BMI) 40.0 and over, adult: Secondary | ICD-10-CM

## 2018-08-25 DIAGNOSIS — I10 Essential (primary) hypertension: Secondary | ICD-10-CM

## 2018-08-29 NOTE — Progress Notes (Signed)
Office: [phone]  /  Fax: [phone] TeleHealth Visit:  Heather [name] has verbally consented to this TeleHealth visit today. The patient is located at home, the provider is located at the [facility] office. The participants in this visit include the listed provider and patient. The visit was conducted today via [email or web address]  HPI:   Chief Complaint: OBESITY Heather [name] is here to discuss her progress with her obesity treatment plan. She is on following a low carb, PC diet plan and is following her eating plan approximately 50% of the time. She states she is exercising 0 minutes 0 times per week. Heather [name] has struggled with weight loss efforts since our last visit and feels she has gained 2 lbs. She hasn't been following a structured plan due to concentrating on other things in her life right now. She plans to get back on track as soon as things settle down, hopefully in the next 2-3 weeks. We were unable to weigh the patient today for this TeleHealth visit. She feels as if she has gained 2 lbs since her last visit. She has lost 73 lbs since starting treatment with [name].  Hypertension Heather [name] is a 73 y.o. female with hypertension and is on losartan.  SHAILENE DEMONBREUN denies chest pain, headache, or dizziness. She is working weight loss to help control her blood pressure with the goal of decreasing her risk of heart attack and stroke. Makenzee's blood pressure is normally well controlled but is 149/94 today. She notes increased stress in the last few days and feels this is why.   ASSESSMENT AND PLAN:  Essential hypertension  Class 3 severe obesity with serious comorbidity and body mass index (BMI) of 40.0 to 44.9 in adult, unspecified obesity type ([place])  PLAN:  Hypertension We discussed sodium restriction, working on healthy weight loss, and a regular exercise program as the means to achieve improved blood pressure control. Stepfanie agreed with this plan and agreed to follow up as  directed. We will continue to monitor her blood pressure as well as her progress with the above lifestyle modifications. Heather [name] will continue losartan, decrease sodium foods, and will follow-up as directed to monitor her progress. She will watch for signs of hypotension as she continues her lifestyle modifications.  I spent > than 50% of the 15 minute visit on counseling as documented in the note.  Obesity Heather [name] is not currently in the action stage of change. As such, her goal is to maintain weight for now. She has agreed to follow the PC/[facility] plan. Heather [name] has been instructed to work up to a goal of 150 minutes of combined cardio and strengthening exercise per week for weight loss and overall health benefits. We discussed the following Behavioral Modification Strategies today: decreasing sodium intake, no skipping meals, and better snacking choices.  Heather [name] has agreed to follow-up with our clinic in 3 weeks. She was informed of the importance of frequent follow-up visits to maximize her success with intensive lifestyle modifications for her multiple health conditions.  ALLERGIES: Allergies  Allergen Reactions  . Aspirin   . Coconut Oil   . Lisinopril     REACTION: cough  . Metoprolol Itching  . Peanut-Containing Drug Products   . Penicillins     REACTION: rash  . Prednisone   . Strawberry Extract     MEDICATIONS: Current Outpatient Medications on File Prior to Visit  Medication Sig Dispense Refill  . acetaminophen (TYLENOL) 500 MG tablet Take 650 mg by mouth  3 (three) times daily as needed for pain.     . Calcium Carbonate-Vitamin D (CALCIUM 600 + D PO) Take 2 tablets by mouth daily.      . clopidogrel (PLAVIX) 75 MG tablet Take 1 tablet (75 mg total) by mouth daily. 90 tablet 3  . Ferrous Sulfate (IRON) 325 (65 FE) MG TABS Take 1 tablet by mouth daily.      . fexofenadine (ALLEGRA) 180 MG tablet Take 180 mg by mouth daily.    . furosemide (LASIX) 40 MG tablet Take 1 tablet (40 mg  total) by mouth daily. 90 tablet 3  . gabapentin (NEURONTIN) 100 MG capsule Take 2 capsules (200 mg total) by mouth at bedtime. 180 capsule 3  . glucosamine-chondroitin 500-400 MG tablet Take 1 tablet by mouth 2 (two) times daily.      [name] losartan (COZAAR) 25 MG tablet TAKE 1 TABLET EVERY DAY    . metFORMIN (GLUCOPHAGE-XR) 500 MG 24 hr tablet Take 2 tablets (1,000 mg total) by mouth at bedtime. 180 tablet 3  . modafinil (PROVIGIL) 200 MG tablet modafinil 200 mg tablet    . Multiple Vitamin (MULTIVITAMIN) tablet Take 1 tablet by mouth daily.      . naproxen sodium (ANAPROX) 220 MG tablet Take 220 mg by mouth as needed.    . Omega-3 Fatty Acids (FISH OIL) 1000 MG CAPS Take 1 capsule by mouth daily.    [name] omeprazole (PRILOSEC) 20 MG capsule Take 20 mg by mouth daily as needed.     . potassium chloride SA (KLOR-CON M20) 20 MEQ tablet Take 1 tablet (20 mEq total) by mouth daily. 90 tablet 3  . pyridOXINE (VITAMIN B-6) 100 MG tablet Take 200 mg by mouth daily.    . rosuvastatin (CRESTOR) 40 MG tablet Take 1 tablet (40 mg total) by mouth daily. At night 90 tablet 3  . SF 5000 PLUS 1.1 % CREA dental cream   0  . SUMAtriptan (IMITREX) 20 MG/ACT nasal spray sumatriptan 20 mg/actuation nasal spray    . topiramate (TOPAMAX) 100 MG tablet Take 1 tablet (100 mg total) by mouth 2 (two) times daily. 180 tablet 3  . Turmeric 500 MG TABS Take 2 tablets by mouth daily.    [name] venlafaxine XR (EFFEXOR XR) 37.5 MG 24 hr capsule Take 1 capsule (37.5 mg total) by mouth daily with breakfast. 90 capsule 1  . vitamin E 200 UNIT capsule Take 200 Units by mouth daily.    [name] Zoster Vaccine Live, PF, (ZOSTAVAX) 84696 UNT/0.65ML injection Zostavax (PF) 19,400 unit/0.65 mL subcutaneous suspension     Current Facility-Administered Medications on File Prior to Visit  Medication Dose Route Frequency Provider Last Rate Last Dose  . 0.9 %  sodium chloride infusion  500 mL Intravenous Continuous Nandigam, Venia Minks, MD        PAST  MEDICAL HISTORY: Past Medical History:  Diagnosis Date  . Abdominal hernia   . Abnormal Pap smear   . ALLERGIC RHINITIS 10/22/2006  . Allergy   . ANEMIA 12/18/2008  . Arthritis    scoliosis moderate deg changes lumbar Xray 11/04/17   . ASYMPTOMATIC POSTMENOPAUSAL STATUS 11/22/2007  . Back pain   . CAD (coronary artery disease)    in LAD  . Complication of anesthesia   . Degenerative arthritis   . DIABETES MELLITUS, TYPE II 01/13/2007  . Dyslipidemia   . Fibroid   . Frequent headaches   . GERD 07/20/2007  . GOITER, MULTINODULAR 07/20/2007  .  Headache(784.0) 07/20/2007  . HEARING LOSS 11/22/2007  . Hiatal hernia   . Hiatal hernia    large  . History of chicken pox   . Hx of colposcopy with cervical biopsy   . HYPERCHOLESTEROLEMIA 01/13/2007  . Hyperglycemia   . HYPERTENSION 10/22/2006  . Lung nodule    unchanged since 05/26/13   . Migraines   . NASH (nonalcoholic steatohepatitis)   . Nocturnal hypoxemia 02/17/2013  . Obesity   . Obstructive sleep apnea   . OSA on CPAP    per neurology  . OSTEOARTHRITIS 10/22/2006  . Other chronic nonalcoholic liver disease 2/42/6834  . Sedimentation rate elevation   . Sleep apnea    on cpap  . Urine incontinence   . UTI (urinary tract infection)     PAST SURGICAL HISTORY: Past Surgical History:  Procedure Laterality Date  . BREAST SURGERY  1984   Breast reduction b/l   . CATARACT EXTRACTION, BILATERAL    . DEXA  08/2005  . DILATION AND CURETTAGE OF UTERUS    . ELECTROCARDIOGRAM  10/15/2006  . ESOPHAGOGASTRODUODENOSCOPY  12/08/2005  . FOOT SURGERY     hammertoe and bunion 05/2017   . Stress Cardiolite  10/21/2005  . sweat gland removal    . WISDOM TOOTH EXTRACTION      SOCIAL HISTORY: Social History   Tobacco Use  . Smoking status: Former Smoker    Packs/day: 2.00    Years: 20.00    Pack years: 40.00    Types: Cigarettes    Quit date: 03/03/1979    Years since quitting: 39.5  . Smokeless tobacco: Never Used  Substance Use  Topics  . Alcohol use: Yes    Comment: rare  . Drug use: No    FAMILY HISTORY: Family History  Problem Relation Age of Onset  . Asthma Mother   . Depression Mother   . Bipolar disorder Mother   . Dementia Mother   . Arthritis Mother   . Breast cancer Mother        primary  . Colon cancer Mother        mets from breast  . Cancer Mother        colon  . Hypertension Father   . Migraines Father   . Cancer Maternal Aunt        ?  [name] Heart disease Maternal Grandmother   . Cancer Maternal Grandfather        stomach ?   ROS: Review of Systems  Cardiovascular: Negative for chest pain.  Neurological: Negative for dizziness and headaches.   PHYSICAL EXAM: Pt in no acute distress  RECENT LABS AND TESTS: BMET    Component Value Date/Time   NA 141 04/27/2018 1219   K 4.0 04/27/2018 1219   CL 104 04/27/2018 1219   CO2 22 04/27/2018 1219   GLUCOSE 78 04/27/2018 1219   GLUCOSE 116 (H) 03/05/2016 0958   BUN 13 04/27/2018 1219   CREATININE 0.67 04/27/2018 1219   CREATININE 0.81 12/28/2011 0853   CALCIUM 9.2 04/27/2018 1219   GFRNONAA 89 04/27/2018 1219   GFRAA 102 04/27/2018 1219   Lab Results  Component Value Date   HGBA1C 5.6 04/27/2018   HGBA1C 6.0 (H) 12/01/2017   HGBA1C 5.7 (H) 07/29/2017   HGBA1C 5.9 03/15/2017   HGBA1C 6.3 (H) 12/23/2016   Lab Results  Component Value Date   INSULIN 3.6 04/27/2018   INSULIN 5.6 12/01/2017   INSULIN 6.8 07/29/2017   INSULIN 20.9 12/23/2016  CBC    Component Value Date/Time   WBC 4.5 04/26/2018 1602   RBC 4.19 04/26/2018 1602   HGB 12.9 04/26/2018 1602   HGB 12.4 12/01/2017 1146   HCT 38.3 04/26/2018 1602   HCT 39.5 12/01/2017 1146   PLT 248.0 04/26/2018 1602   MCV 91.2 04/26/2018 1602   MCV 91 12/01/2017 1146   MCH 28.5 12/01/2017 1146   MCH 29.4 12/28/2011 0853   MCHC 33.7 04/26/2018 1602   RDW 16.7 (H) 04/26/2018 1602   RDW 16.2 (H) 12/01/2017 1146   LYMPHSABS 1.3 04/26/2018 1602   LYMPHSABS 1.2 12/01/2017  1146   MONOABS 0.4 04/26/2018 1602   EOSABS 0.1 04/26/2018 1602   EOSABS 0.1 12/01/2017 1146   BASOSABS 0.0 04/26/2018 1602   BASOSABS 0.0 12/01/2017 1146   Iron/TIBC/Ferritin/ %Sat    Component Value Date/Time   IRON 43 (L) 04/26/2018 1602   IRON 38 12/23/2016 0956   TIBC 249 (L) 04/26/2018 1602   TIBC 271 12/23/2016 0956   FERRITIN 60 04/26/2018 1602   FERRITIN 40 12/23/2016 0956   IRONPCTSAT 17 04/26/2018 1602   Lipid Panel     Component Value Date/Time   CHOL 207 (H) 04/27/2018 1219   TRIG 63 04/27/2018 1219   HDL 72 04/27/2018 1219   CHOLHDL 3 03/05/2016 0958   VLDL 26.0 03/05/2016 0958   LDLCALC 122 (H) 04/27/2018 1219   Hepatic Function Panel     Component Value Date/Time   PROT 7.0 04/27/2018 1219   ALBUMIN 4.0 04/27/2018 1219   AST 18 04/27/2018 1219   ALT 12 04/27/2018 1219   ALKPHOS 61 04/27/2018 1219   BILITOT <0.2 04/27/2018 1219   BILIDIR 0.1 02/15/2015 1010   IBILI 0.2 12/28/2011 0853      Component Value Date/Time   TSH 1.41 04/26/2018 1602   TSH 1.170 12/23/2016 0956   TSH 1.76 03/05/2016 0958   Results for EMMARAE, COWDERY (MRN [id number]) as of 08/29/2018 13:28  Ref. Range 04/27/2018 12:19  Vitamin D, 25-Hydroxy Latest Ref Range: 30.0 - 100.0 ng/mL 36.9   I, Michaelene Song, am acting as Location manager for [name], MD  I have reviewed the above documentation for accuracy and completeness, and I agree with the above. -[name], MD

## 2018-08-31 ENCOUNTER — Ambulatory Visit: Payer: Self-pay | Admitting: Adult Health

## 2018-09-08 ENCOUNTER — Ambulatory Visit (INDEPENDENT_AMBULATORY_CARE_PROVIDER_SITE_OTHER): Payer: Medicare Other | Admitting: Internal Medicine

## 2018-09-08 ENCOUNTER — Other Ambulatory Visit: Payer: Self-pay

## 2018-09-08 DIAGNOSIS — J309 Allergic rhinitis, unspecified: Secondary | ICD-10-CM | POA: Diagnosis not present

## 2018-09-08 DIAGNOSIS — Z9989 Dependence on other enabling machines and devices: Secondary | ICD-10-CM | POA: Diagnosis not present

## 2018-09-08 DIAGNOSIS — G4733 Obstructive sleep apnea (adult) (pediatric): Secondary | ICD-10-CM

## 2018-09-08 DIAGNOSIS — R2241 Localized swelling, mass and lump, right lower limb: Secondary | ICD-10-CM

## 2018-09-08 DIAGNOSIS — L299 Pruritus, unspecified: Secondary | ICD-10-CM

## 2018-09-08 DIAGNOSIS — K449 Diaphragmatic hernia without obstruction or gangrene: Secondary | ICD-10-CM | POA: Diagnosis not present

## 2018-09-08 DIAGNOSIS — E119 Type 2 diabetes mellitus without complications: Secondary | ICD-10-CM | POA: Diagnosis not present

## 2018-09-08 MED ORDER — IPRATROPIUM BROMIDE 0.06 % NA SOLN: 2.0000 | Freq: Four times a day (QID) | NASAL | 12 refills | Status: DC

## 2018-09-08 MED ORDER — METFORMIN HCL 500 MG PO TABS: 1000.0000 mg | ORAL_TABLET | Freq: Every day | ORAL | 3 refills | Status: DC

## 2018-09-08 MED ORDER — LORATADINE 10 MG PO TABS: 10.0000 mg | ORAL_TABLET | Freq: Every day | ORAL | 11 refills | Status: DC

## 2018-09-08 NOTE — Progress Notes (Signed)
Virtual Visit via Video Note  I connected with Heather [name]   on 09/08/18 at  2:00 PM EDT by a video enabled telemedicine application and verified that I am speaking with the correct person using two identifiers.  Location patient: home Location provider:work  Persons participating in the virtual visit: patient, provider, pts husband   I discussed the limitations of evaluation and management by telemedicine and the availability of in person appointments. The patient expressed understanding and agreed to proceed.   HPI: 1. DM 2 A1C 5.6 as of 04/27/18 on metformin XR 500 mg x 2 pills =1000 but this is on recall not on ARB losartan 25 mg due to low blood pressure.  She is working with obesity clinic to try to lose weight   2. Large hiatal hernia 05/12/18 and right thigh lesion which per pt is lessened in size -she wants to see Dr. Johnathan Hausen in [place] initially she saw Dr. Kreg Shropshire but this Is too far for her to travel to [place] chest 01/14/18  IMPRESSION: 1. 13 x 6 mm enhancing subcutaneous nodule corresponding to the patient's palpable abnormality in the right medial anterior thigh. No specific CT imaging features. It is not a lymph node, lipoma or cyst. This appears to extend to the skin. It may require excision. 2. Periumbilical abdominal wall hernia containing a small bowel loop, fat and the appendix. No findings for incarceration or obstruction. 3. Uterine fibroids. CT 01/14/18  Very large hiatal hernia containing the entire stomach and a significant portion of the colon. Mild atheromatous arterial calcifications, including the coronary arteries.  Mild changes of COPD. Stable goiter. 6Colonic diverticulosis.   3. OSA on bipap sleep study next week and COVID 19 test prior she has O2 tank for unclear reason not using will message neurology? Dr. Brett Fairy   4. Allergies allegra is not helping allegra D did help with sx's of runny nose. She also reports dry cough. Claritin  did help some but no with runny nose. She denies post drip. She has not been uring flonase    5. Itching at times all over body especially back - no use of perfumes or soaps or laundry detergents nothing new  ROS: See pertinent positives and negatives per HPI.  Past Medical History:  Diagnosis Date  . Abdominal hernia   . Abnormal Pap smear   . ALLERGIC RHINITIS 10/22/2006  . Allergy   . ANEMIA 12/18/2008  . Arthritis    scoliosis moderate deg changes lumbar Xray 11/04/17   . ASYMPTOMATIC POSTMENOPAUSAL STATUS 11/22/2007  . Back pain   . CAD (coronary artery disease)    in LAD  . Complication of anesthesia   . Degenerative arthritis   . DIABETES MELLITUS, TYPE II 01/13/2007  . Dyslipidemia   . Fibroid   . Frequent headaches   . GERD 07/20/2007  . GOITER, MULTINODULAR 07/20/2007  . Headache(784.0) 07/20/2007  . HEARING LOSS 11/22/2007  . Hiatal hernia   . Hiatal hernia    large  . History of chicken pox   . Hx of colposcopy with cervical biopsy   . HYPERCHOLESTEROLEMIA 01/13/2007  . Hyperglycemia   . HYPERTENSION 10/22/2006  . Lung nodule    unchanged since 05/26/13   . Migraines   . NASH (nonalcoholic steatohepatitis)   . Nocturnal hypoxemia 02/17/2013  . Obesity   . Obstructive sleep apnea   . OSA on CPAP    per neurology  . OSTEOARTHRITIS 10/22/2006  . Other chronic  nonalcoholic liver disease 03/03/5850  . Sedimentation rate elevation   . Sleep apnea    on cpap  . Urine incontinence   . UTI (urinary tract infection)     Past Surgical History:  Procedure Laterality Date  . BREAST SURGERY  1984   Breast reduction b/l   . CATARACT EXTRACTION, BILATERAL    . DEXA  08/2005  . DILATION AND CURETTAGE OF UTERUS    . ELECTROCARDIOGRAM  10/15/2006  . ESOPHAGOGASTRODUODENOSCOPY  12/08/2005  . FOOT SURGERY     hammertoe and bunion 05/2017   . Stress Cardiolite  10/21/2005  . sweat gland removal    . WISDOM TOOTH EXTRACTION      Family History  Problem Relation Age of  Onset  . Asthma Mother   . Depression Mother   . Bipolar disorder Mother   . Dementia Mother   . Arthritis Mother   . Breast cancer Mother        primary  . Colon cancer Mother        mets from breast  . Cancer Mother        colon  . Hypertension Father   . Migraines Father   . Cancer Maternal Aunt        ?  [name] Heart disease Maternal Grandmother   . Cancer Maternal Grandfather        stomach ?    SOCIAL HX: married with kids    Current Outpatient Medications:  .  acetaminophen (TYLENOL) 500 MG tablet, Take 650 mg by mouth 3 (three) times daily as needed for pain. , Disp: , Rfl:  .  Calcium Carbonate-Vitamin D (CALCIUM 600 + D PO), Take 2 tablets by mouth daily.  , Disp: , Rfl:  .  clopidogrel (PLAVIX) 75 MG tablet, Take 1 tablet (75 mg total) by mouth daily., Disp: 90 tablet, Rfl: 3 .  Ferrous Sulfate (IRON) 325 (65 FE) MG TABS, Take 1 tablet by mouth daily.  , Disp: , Rfl:  .  fexofenadine (ALLEGRA) 180 MG tablet, Take 180 mg by mouth daily., Disp: , Rfl:  .  furosemide (LASIX) 40 MG tablet, Take 1 tablet (40 mg total) by mouth daily., Disp: 90 tablet, Rfl: 3 .  gabapentin (NEURONTIN) 100 MG capsule, Take 2 capsules (200 mg total) by mouth at bedtime., Disp: 180 capsule, Rfl: 3 .  glucosamine-chondroitin 500-400 MG tablet, Take 1 tablet by mouth 2 (two) times daily.  , Disp: , Rfl:  .  ipratropium (ATROVENT) 0.06 % nasal spray, Place 2 sprays into both nostrils 4 (four) times daily., Disp: 15 mL, Rfl: 12 .  loratadine (CLARITIN) 10 MG tablet, Take 1 tablet (10 mg total) by mouth daily., Disp: 30 tablet, Rfl: 11 .  losartan (COZAAR) 25 MG tablet, TAKE 1 TABLET EVERY DAY, Disp: , Rfl:  .  metFORMIN (GLUCOPHAGE) 500 MG tablet, Take 2 tablets (1,000 mg total) by mouth daily with breakfast., Disp: 180 tablet, Rfl: 3 .  modafinil (PROVIGIL) 200 MG tablet, modafinil 200 mg tablet, Disp: , Rfl:  .  Multiple Vitamin (MULTIVITAMIN) tablet, Take 1 tablet by mouth daily.  , Disp: , Rfl:  .   naproxen sodium (ANAPROX) 220 MG tablet, Take 220 mg by mouth as needed., Disp: , Rfl:  .  Omega-3 Fatty Acids (FISH OIL) 1000 MG CAPS, Take 1 capsule by mouth daily., Disp: , Rfl:  .  omeprazole (PRILOSEC) 20 MG capsule, Take 20 mg by mouth daily as needed. , Disp: , Rfl:  .  potassium chloride SA (KLOR-CON M20) 20 MEQ tablet, Take 1 tablet (20 mEq total) by mouth daily., Disp: 90 tablet, Rfl: 3 .  pyridOXINE (VITAMIN B-6) 100 MG tablet, Take 200 mg by mouth daily., Disp: , Rfl:  .  rosuvastatin (CRESTOR) 40 MG tablet, Take 1 tablet (40 mg total) by mouth daily. At night, Disp: 90 tablet, Rfl: 3 .  SF 5000 PLUS 1.1 % CREA dental cream, , Disp: , Rfl: 0 .  SUMAtriptan (IMITREX) 20 MG/ACT nasal spray, sumatriptan 20 mg/actuation nasal spray, Disp: , Rfl:  .  topiramate (TOPAMAX) 100 MG tablet, Take 1 tablet (100 mg total) by mouth 2 (two) times daily., Disp: 180 tablet, Rfl: 3 .  Turmeric 500 MG TABS, Take 2 tablets by mouth daily., Disp: , Rfl:  .  venlafaxine XR (EFFEXOR XR) 37.5 MG 24 hr capsule, Take 1 capsule (37.5 mg total) by mouth daily with breakfast., Disp: 90 capsule, Rfl: 1 .  vitamin E 200 UNIT capsule, Take 200 Units by mouth daily., Disp: , Rfl:  .  Zoster Vaccine Live, PF, (ZOSTAVAX) 28786 UNT/0.65ML injection, Zostavax (PF) 19,400 unit/0.65 mL subcutaneous suspension, Disp: , Rfl:   Current Facility-Administered Medications:  .  0.9 %  sodium chloride infusion, 500 mL, Intravenous, Continuous, Nandigam, Venia Minks, MD  EXAM:  VITALS per patient if applicable:  GENERAL: alert, oriented, appears well and in no acute distress  HEENT: atraumatic, conjunttiva clear, no obvious abnormalities on inspection of external nose and ears  NECK: normal movements of the head and neck  LUNGS: on inspection no signs of respiratory distress, breathing rate appears normal, no obvious gross SOB, gasping or wheezing  CV: no obvious cyanosis  MS: moves all visible extremities without  noticeable abnormality  PSYCH/NEURO: pleasant and cooperative, no obvious depression or anxiety, speech and thought processing grossly intact  ASSESSMENT AND PLAN:  Discussed the following assessment and plan:  Large Hiatal hernia containing stomach and portion of intestine - Plan: Ambulatory referral to General Surgery Dr. Johnathan Hausen in [place]  Mass of right thigh - Plan: Ambulatory referral to General Surgery to review CT to see if needs biopsy see CT pelvis   Allergic rhinitis, unspecified seasonality, unspecified trigger - Plan: ipratropium (ATROVENT) 0.06 % nasal spray tid to qid, loratadine (CLARITIN) 10 MG tablet prn hold on allegra D reviewed side effects of taking decongestants and not healthy long term/chronic use  OSA on bipap - Plan: covid testing pre sleep study upcoming message Dr. Brett Fairy to see if pt needs O2 tank at home as well this determination may be pending sleep study   Type 2 diabetes mellitus without complication V6H 5.6 04/10/45 change XR metformin 1000 mg qd to IR due to recall  -eye exam and foot exam in future discuss with pt   Pruritus - Plan: try claritin if does not improve try hydroxyzine and if continues consider labs and further w/u internal etiology. If on back could be notalgia peristhetica and will rec capsaician cream otc. Prev hep C negative in 2013   HM Flu shot high dose utd  prevnar had 01/30/14 pna 23 had 12/18/08 due again  zostavax had 01/30/14 -consider shingrix  Tdap 01/30/14   DEXA 03/06/15 normal  mammo copy get from Dr. Leo [facility]  -per pt she will f/u with Dr. Mancel Bale who ordered her mammogram  -mammogram 06/20/18 normal   Pap out of age window Colonoscopy 06/18/16 severe diverticulosis polyp x 1 tubular adenoma FH mom colon cancer f/u in 5  years   HCV neg 12/28/11 Eye exam as at f/u and consider if not had  Former smoker quit in 24s smoker x 20 years+ 2 ppd. CT 01/14/18 mild COPD (off Advair), stable goiter, mild CAD, stable  left lung nodule lower lobe likely benign   Down 70s + #s trying weight stable since 8/2060fu with [facility] Dr. BLeafy Ro congratulated   Cardiologist [facility]ie [facility]     I discussed the assessment and treatment plan with the patient. The patient was provided an opportunity to ask questions and all were answered. The patient agreed with the plan and demonstrated an understanding of the instructions.   The patient was advised to call back or seek an in-person evaluation if the symptoms worsen or if the condition fails to improve as anticipated.  Time spent 25 minutes  TDelorise Jackson MD

## 2018-09-09 ENCOUNTER — Other Ambulatory Visit: Payer: Self-pay | Admitting: Internal Medicine

## 2018-09-09 ENCOUNTER — Encounter: Payer: Self-pay | Admitting: Internal Medicine

## 2018-09-09 DIAGNOSIS — E119 Type 2 diabetes mellitus without complications: Secondary | ICD-10-CM

## 2018-09-09 MED ORDER — METFORMIN HCL 500 MG PO TABS: 1000.0000 mg | ORAL_TABLET | Freq: Every day | ORAL | 3 refills | Status: DC

## 2018-09-13 ENCOUNTER — Telehealth: Payer: Self-pay | Admitting: Internal Medicine

## 2018-09-13 DIAGNOSIS — R2241 Localized swelling, mass and lump, right lower limb: Secondary | ICD-10-CM | POA: Insufficient documentation

## 2018-09-13 NOTE — Telephone Encounter (Signed)
Dr. Brett Fairy  Patient has O2 [place] at home do you know for what reason She has upcoming sleep study for bipap, does she need O2 at night   [place]

## 2018-09-14 ENCOUNTER — Telehealth (INDEPENDENT_AMBULATORY_CARE_PROVIDER_SITE_OTHER): Payer: Medicare Other | Admitting: Family Medicine

## 2018-09-15 ENCOUNTER — Encounter (INDEPENDENT_AMBULATORY_CARE_PROVIDER_SITE_OTHER): Payer: Self-pay | Admitting: Family Medicine

## 2018-09-15 ENCOUNTER — Other Ambulatory Visit: Payer: Self-pay

## 2018-09-15 ENCOUNTER — Telehealth (INDEPENDENT_AMBULATORY_CARE_PROVIDER_SITE_OTHER): Payer: Medicare Other | Admitting: Family Medicine

## 2018-09-15 DIAGNOSIS — F418 Other specified anxiety disorders: Secondary | ICD-10-CM | POA: Diagnosis not present

## 2018-09-15 DIAGNOSIS — Z6841 Body Mass Index (BMI) 40.0 and over, adult: Secondary | ICD-10-CM

## 2018-09-15 NOTE — Telephone Encounter (Signed)
  Heather [name], The patient was diagnosed with obesity hypoventilation since or  before 2015 and mainained for sleep hypoxemia. She has lost weight in the meantime -  We have scheduled for 7-23 and she will be  restesting her for oxygen need.Asencion Partridge Keslie Gritz

## 2018-09-15 NOTE — Telephone Encounter (Signed)
Thank you   [place]

## 2018-09-19 ENCOUNTER — Other Ambulatory Visit (HOSPITAL_COMMUNITY)
Admission: RE | Admit: 2018-09-19 | Discharge: 2018-09-19 | Disposition: A | Discharge status: Home / Self Care | Payer: Medicare Other | Source: Ambulatory Visit | Attending: Neurology | Admitting: Neurology

## 2018-09-19 DIAGNOSIS — Z1159 Encounter for screening for other viral diseases: Secondary | ICD-10-CM | POA: Insufficient documentation

## 2018-09-19 LAB — SARS CORONAVIRUS 2 (TAT 6-24 HRS): SARS Coronavirus 2: NEGATIVE

## 2018-09-19 NOTE — Progress Notes (Signed)
Office: [phone]  /  Fax: [phone] TeleHealth Visit:  Heather [name] has verbally consented to this TeleHealth visit today. The patient is located at home, the provider is located at the [employer] [facility] office. The participants in this visit include the listed provider, patient, and the patient's husband, [name]. The visit was conducted today via telephone call (FaceTime failed - changed to telephone call).  HPI:   Chief Complaint: OBESITY Heather [name] is here to discuss her progress with her obesity treatment plan. She is on the low carbohydrate diet plan and is following her eating plan approximately 10% of the time. She states she is walking/using elliptical 20-30 minutes 3 times per week. Heather [name] has struggled more with her diet and has fallen off track and she thinks she has gained 6 lbs since our last visit. She had her kids visit her and that got her off track, but she is ready to go back to her low carb diet plan. We were unable to weigh the patient today for this TeleHealth visit. She feels as if she has gained 6 lbs since her last visit. She has lost 73 lbs since starting treatment with [name].  Depression with emotional eating behaviors Heather [name] is struggling with emotional eating and using food for comfort to the extent that it is negatively impacting her health. She often snacks when she is not hungry. Heather [name] sometimes feels she is out of control and then feels guilty that she made poor food choices. She has been working on behavior modification techniques to help reduce her emotional eating and has been somewhat successful. Heather [name] is on Effexor. She states she has had a difficult time with COVID-19 isolation and has increased emotional eating. She is ready to focus on this more intensely. She shows no sign of suicidal or homicidal ideations.  Depression screen [facility] 2/9 01/05/2018 12/23/2016  Decreased Interest 0 2  Down, Depressed, Hopeless 0 1  PHQ - 2 Score 0 3  Altered  sleeping - 1  Tired, decreased energy - 3  Change in appetite - 1  Feeling bad or failure about yourself  - 0  Trouble concentrating - 0  Moving slowly or fidgety/restless - 2  Suicidal thoughts - 0  PHQ-9 Score - 10  Difficult doing work/chores - Somewhat difficult  Some recent data might be hidden   ASSESSMENT AND PLAN:  Depression with anxiety  Class 3 severe obesity with serious comorbidity and body mass index (BMI) of 40.0 to 44.9 in adult, unspecified obesity type (HCC)  PLAN:  Depression with Emotional Eating Behaviors We discussed behavior modification techniques today to help Sheniece deal with her emotional eating and depression. Darbie will continue Effexor as is and CBT to help deal with frequency and planning for emotional eating was done today.   I spent > than 50% of the 25 minute visit on counseling as documented in the note.  Obesity Heather [name] is currently in the action stage of change. As such, her goal is to continue with weight loss efforts. She has agreed to follow a lower carbohydrate, vegetable and lean protein rich diet plan. Gloria has been instructed to work up to a goal of 150 minutes of combined cardio and strengthening exercise per week for weight loss and overall health benefits. We discussed the following Behavioral Modification Strategies today: work on meal planning and easy cooking plans, ways to avoid boredom eating, ways to avoid night time snacking, and emotional eating strategies.  Heather [name] has agreed to follow-up  with our clinic in 2 weeks. She was informed of the importance of frequent follow-up visits to maximize her success with intensive lifestyle modifications for her multiple health conditions.  ALLERGIES: Allergies  Allergen Reactions  . Aspirin   . Coconut Oil   . Lisinopril     REACTION: cough  . Metoprolol Itching  . Peanut-Containing Drug Products   . Penicillins     REACTION: rash  . Prednisone   . Strawberry Extract      MEDICATIONS: Current Outpatient Medications on File Prior to Visit  Medication Sig Dispense Refill  . acetaminophen (TYLENOL) 500 MG tablet Take 650 mg by mouth 3 (three) times daily as needed for pain.     . Calcium Carbonate-Vitamin D (CALCIUM 600 + D PO) Take 2 tablets by mouth daily.      . clopidogrel (PLAVIX) 75 MG tablet Take 1 tablet (75 mg total) by mouth daily. 90 tablet 3  . Ferrous Sulfate (IRON) 325 (65 FE) MG TABS Take 1 tablet by mouth daily.      . fexofenadine (ALLEGRA) 180 MG tablet Take 180 mg by mouth daily.    . furosemide (LASIX) 40 MG tablet Take 1 tablet (40 mg total) by mouth daily. 90 tablet 3  . gabapentin (NEURONTIN) 100 MG capsule Take 2 capsules (200 mg total) by mouth at bedtime. 180 capsule 3  . glucosamine-chondroitin 500-400 MG tablet Take 1 tablet by mouth 2 (two) times daily.      [name] ipratropium (ATROVENT) 0.06 % nasal spray Place 2 sprays into both nostrils 4 (four) times daily. 15 mL 12  . loratadine (CLARITIN) 10 MG tablet Take 1 tablet (10 mg total) by mouth daily. 30 tablet 11  . losartan (COZAAR) 25 MG tablet TAKE 1 TABLET EVERY DAY    . metFORMIN (GLUCOPHAGE) 500 MG tablet Take 2 tablets (1,000 mg total) by mouth daily with breakfast. 180 tablet 3  . modafinil (PROVIGIL) 200 MG tablet modafinil 200 mg tablet    . Multiple Vitamin (MULTIVITAMIN) tablet Take 1 tablet by mouth daily.      . naproxen sodium (ANAPROX) 220 MG tablet Take 220 mg by mouth as needed.    . Omega-3 Fatty Acids (FISH OIL) 1000 MG CAPS Take 1 capsule by mouth daily.    [name] omeprazole (PRILOSEC) 20 MG capsule Take 20 mg by mouth daily as needed.     . potassium chloride SA (KLOR-CON M20) 20 MEQ tablet Take 1 tablet (20 mEq total) by mouth daily. 90 tablet 3  . pyridOXINE (VITAMIN B-6) 100 MG tablet Take 200 mg by mouth daily.    . rosuvastatin (CRESTOR) 40 MG tablet Take 1 tablet (40 mg total) by mouth daily. At night 90 tablet 3  . SF 5000 PLUS 1.1 % CREA dental cream   0  .  SUMAtriptan (IMITREX) 20 MG/ACT nasal spray sumatriptan 20 mg/actuation nasal spray    . topiramate (TOPAMAX) 100 MG tablet Take 1 tablet (100 mg total) by mouth 2 (two) times daily. 180 tablet 3  . Turmeric 500 MG TABS Take 2 tablets by mouth daily.    [name] venlafaxine XR (EFFEXOR XR) 37.5 MG 24 hr capsule Take 1 capsule (37.5 mg total) by mouth daily with breakfast. 90 capsule 1  . vitamin E 200 UNIT capsule Take 200 Units by mouth daily.    [name] Zoster Vaccine Live, PF, (ZOSTAVAX) 37169 UNT/0.65ML injection Zostavax (PF) 19,400 unit/0.65 mL subcutaneous suspension     Current Facility-Administered Medications on  File Prior to Visit  Medication Dose Route Frequency Provider Last Rate Last Dose  . 0.9 %  sodium chloride infusion  500 mL Intravenous Continuous Nandigam, Venia Minks, MD        PAST MEDICAL HISTORY: Past Medical History:  Diagnosis Date  . Abdominal hernia   . Abnormal Pap smear   . ALLERGIC RHINITIS 10/22/2006  . Allergy   . ANEMIA 12/18/2008  . Arthritis    scoliosis moderate deg changes lumbar Xray 11/04/17   . ASYMPTOMATIC POSTMENOPAUSAL STATUS 11/22/2007  . Back pain   . CAD (coronary artery disease)    in LAD  . Complication of anesthesia   . Degenerative arthritis   . DIABETES MELLITUS, TYPE II 01/13/2007  . Dyslipidemia   . Fibroid   . Frequent headaches   . GERD 07/20/2007  . GOITER, MULTINODULAR 07/20/2007  . Headache(784.0) 07/20/2007  . HEARING LOSS 11/22/2007  . Hiatal hernia   . Hiatal hernia    large  . History of chicken pox   . Hx of colposcopy with cervical biopsy   . HYPERCHOLESTEROLEMIA 01/13/2007  . Hyperglycemia   . HYPERTENSION 10/22/2006  . Lung nodule    unchanged since 05/26/13   . Migraines   . NASH (nonalcoholic steatohepatitis)   . Nocturnal hypoxemia 02/17/2013  . Obesity   . Obstructive sleep apnea   . OSA on CPAP    per neurology  . OSTEOARTHRITIS 10/22/2006  . Other chronic nonalcoholic liver disease 11/03/90  . Sedimentation rate  elevation   . Sleep apnea    on cpap  . Urine incontinence   . UTI (urinary tract infection)     PAST SURGICAL HISTORY: Past Surgical History:  Procedure Laterality Date  . BREAST SURGERY  1984   Breast reduction b/l   . CATARACT EXTRACTION, BILATERAL    . DEXA  08/2005  . DILATION AND CURETTAGE OF UTERUS    . ELECTROCARDIOGRAM  10/15/2006  . ESOPHAGOGASTRODUODENOSCOPY  12/08/2005  . FOOT SURGERY     hammertoe and bunion 05/2017   . Stress Cardiolite  10/21/2005  . sweat gland removal    . WISDOM TOOTH EXTRACTION      SOCIAL HISTORY: Social History   Tobacco Use  . Smoking status: Former Smoker    Packs/day: 2.00    Years: 20.00    Pack years: 40.00    Types: Cigarettes    Quit date: 03/03/1979    Years since quitting: 39.5  . Smokeless tobacco: Never Used  Substance Use Topics  . Alcohol use: Yes    Comment: rare  . Drug use: No    FAMILY HISTORY: Family History  Problem Relation Age of Onset  . Asthma Mother   . Depression Mother   . Bipolar disorder Mother   . Dementia Mother   . Arthritis Mother   . Breast cancer Mother        primary  . Colon cancer Mother        mets from breast  . Cancer Mother        colon  . Hypertension Father   . Migraines Father   . Cancer Maternal Aunt        ?  [name] Heart disease Maternal Grandmother   . Cancer Maternal Grandfather        stomach ?   ROS: Review of Systems  Psychiatric/Behavioral: Positive for depression (emotional eating). Negative for suicidal ideas.       Negative for homicidal ideas.  PHYSICAL EXAM: Pt in no acute distress  RECENT LABS AND TESTS: BMET    Component Value Date/Time   NA 141 04/27/2018 1219   K 4.0 04/27/2018 1219   CL 104 04/27/2018 1219   CO2 22 04/27/2018 1219   GLUCOSE 78 04/27/2018 1219   GLUCOSE 116 (H) 03/05/2016 0958   BUN 13 04/27/2018 1219   CREATININE 0.67 04/27/2018 1219   CREATININE 0.81 12/28/2011 0853   CALCIUM 9.2 04/27/2018 1219   GFRNONAA 89 04/27/2018  1219   GFRAA 102 04/27/2018 1219   Lab Results  Component Value Date   HGBA1C 5.6 04/27/2018   HGBA1C 6.0 (H) 12/01/2017   HGBA1C 5.7 (H) 07/29/2017   HGBA1C 5.9 03/15/2017   HGBA1C 6.3 (H) 12/23/2016   Lab Results  Component Value Date   INSULIN 3.6 04/27/2018   INSULIN 5.6 12/01/2017   INSULIN 6.8 07/29/2017   INSULIN 20.9 12/23/2016   CBC    Component Value Date/Time   WBC 4.5 04/26/2018 1602   RBC 4.19 04/26/2018 1602   HGB 12.9 04/26/2018 1602   HGB 12.4 12/01/2017 1146   HCT 38.3 04/26/2018 1602   HCT 39.5 12/01/2017 1146   PLT 248.0 04/26/2018 1602   MCV 91.2 04/26/2018 1602   MCV 91 12/01/2017 1146   MCH 28.5 12/01/2017 1146   MCH 29.4 12/28/2011 0853   MCHC 33.7 04/26/2018 1602   RDW 16.7 (H) 04/26/2018 1602   RDW 16.2 (H) 12/01/2017 1146   LYMPHSABS 1.3 04/26/2018 1602   LYMPHSABS 1.2 12/01/2017 1146   MONOABS 0.4 04/26/2018 1602   EOSABS 0.1 04/26/2018 1602   EOSABS 0.1 12/01/2017 1146   BASOSABS 0.0 04/26/2018 1602   BASOSABS 0.0 12/01/2017 1146   Iron/TIBC/Ferritin/ %Sat    Component Value Date/Time   IRON 43 (L) 04/26/2018 1602   IRON 38 12/23/2016 0956   TIBC 249 (L) 04/26/2018 1602   TIBC 271 12/23/2016 0956   FERRITIN 60 04/26/2018 1602   FERRITIN 40 12/23/2016 0956   IRONPCTSAT 17 04/26/2018 1602   Lipid Panel     Component Value Date/Time   CHOL 207 (H) 04/27/2018 1219   TRIG 63 04/27/2018 1219   HDL 72 04/27/2018 1219   CHOLHDL 3 03/05/2016 0958   VLDL 26.0 03/05/2016 0958   LDLCALC 122 (H) 04/27/2018 1219   Hepatic Function Panel     Component Value Date/Time   PROT 7.0 04/27/2018 1219   ALBUMIN 4.0 04/27/2018 1219   AST 18 04/27/2018 1219   ALT 12 04/27/2018 1219   ALKPHOS 61 04/27/2018 1219   BILITOT <0.2 04/27/2018 1219   BILIDIR 0.1 02/15/2015 1010   IBILI 0.2 12/28/2011 0853      Component Value Date/Time   TSH 1.41 04/26/2018 1602   TSH 1.170 12/23/2016 0956   TSH 1.76 03/05/2016 0958   Results for LARAINA, SULTON (MRN [id number]) as of 09/19/2018 11:12  Ref. Range 04/27/2018 12:19  Vitamin D, 25-Hydroxy Latest Ref Range: 30.0 - 100.0 ng/mL 36.9   I, Michaelene Song, am acting as Location manager for [name], MD I have reviewed the above documentation for accuracy and completeness, and I agree with the above. -[name], MD

## 2018-09-22 ENCOUNTER — Ambulatory Visit (INDEPENDENT_AMBULATORY_CARE_PROVIDER_SITE_OTHER): Payer: Medicare Other | Admitting: Neurology

## 2018-09-22 ENCOUNTER — Other Ambulatory Visit: Payer: Self-pay

## 2018-09-22 DIAGNOSIS — G4733 Obstructive sleep apnea (adult) (pediatric): Secondary | ICD-10-CM

## 2018-09-22 DIAGNOSIS — Z9989 Dependence on other enabling machines and devices: Secondary | ICD-10-CM

## 2018-09-22 DIAGNOSIS — R0601 Orthopnea: Secondary | ICD-10-CM

## 2018-09-22 DIAGNOSIS — R519 Headache, unspecified: Secondary | ICD-10-CM

## 2018-09-22 DIAGNOSIS — Z9981 Dependence on supplemental oxygen: Secondary | ICD-10-CM

## 2018-09-23 NOTE — Addendum Note (Signed)
Addended by: Larey Seat on: 09/23/2018 12:22 PM   Modules accepted: Orders

## 2018-09-23 NOTE — Procedures (Signed)
PATIENT'S NAME:  Heather, [name] DOB:      1945/10/30      MR#:    [id number]     DATE OF RECORDING: 09/22/2018   Rudene Christians REFERRING M.D.:  [name], MD and Dr. Lynford Citizen, MD  Study Performed:   Titration to BiPAP HISTORY:  Heather [name] is a 73 y.o. female patient of Dr. Jannifer Franklin and was seen in a tele-revisit on 05-24-2018 for follow up on OSA with BiPAP therapy while also on oxygen. Her machine is over 41 years old and the patient has been using her CPAP 22 out of 30 days but only 10 of these days over 4 hours.  She is using BiPAP with an inspiratory pressure of 11 and expiratory pressure setting of 7 cm water pressure.  Her residual AHI is excellent at 0.4/h of sleep.  She has very few central events. The patient then underwent HST 07-12-2018 while not on PAP or oxygen to get new supplies and a new machine. The AHI overall was 19.1/h, REM AHI was 40/h and the patient had to start on auto CPAP 5-15 cm water , 3 cm EPR to document that this modality is enough to control apnea or not.  Her insurance then approved BiPAP re-titration.   Abdominal hernia, Abnormal Pap smear, Allergic rhinitis, Allergy, Anemia, Arthritis, Back pain, CAD, Diabetes, Dyslipidemia, Fibroid, Headaches, Hearing loss, Hiatal hernia, GERD, Hypertension, Migraines, NASH, Nocturnal hypoxemia, Obesity  The patient endorsed the Epworth Sleepiness Scale at 15 points.   The patient's weight 195 pounds with a height of 65 (inches), resulting in a BMI of 32.3 kg/m2. The patient's neck circumference measured 14.5 inches.  CURRENT MEDICATIONS: Tylenol, Calcium Vit D, Plavix, Iron, Allegra, Lasix, Neurontin, Cozaar, Metformin, Provigil, Multivitamin, Anaprox, Fish oil, Prilosec, Klor-con, Crestor, Imitrex, Topamax, Tumeric, Effexor XR    PROCEDURE:  This is a multichannel digital polysomnogram utilizing the SomnoStar 11.2 system.  Electrodes and sensors were applied and monitored per AASM Specifications.   EEG, EOG, Chin and Limb EMG,  were sampled at 200 Hz.  ECG, Snore and Nasal Pressure, Thermal Airflow, Respiratory Effort, CPAP Flow and Pressure, Oximetry was sampled at 50 Hz. Digital video and audio were recorded.      BiPAP was initiated at 9/5 cmH20 with heated humidity per AASM split night standards and AHI was 26.7/h in supine sleep- pressure was advanced to 10/6 cmH20 and AHI was 15/h at this pressure - followed by BiPAP at 11/7 cm water because of apneas.  At a BiPAP pressure of 11/ 7 and at 12/8 cmH20, there was a reduction of the AHI to 0.0 with improvement of sleep apnea. REM rebounded at the last explored pressure of 12/8 cm water.   Lights Out was at 22:00 and Lights On at 05:00. Total recording time (TRT) was 418 minutes, with a total sleep time (TST) of 359 minutes. The patient's sleep latency was 10 minutes. REM latency was 84.5 minutes.  The sleep efficiency was 85.9 %.    SLEEP ARCHITECTURE: WASO (Wake after sleep onset) was 50.5 minutes.  There were 16.5 minutes in Stage N1, 226 minutes Stage N2, 49 minutes Stage N3 and 67.5 minutes in Stage REM.  The percentage of Stage N1 was 4.6%, Stage N2 was 63.%, Stage N3 was 13.6% and Stage R (REM sleep) was 18.8%.   RESPIRATORY ANALYSIS:  There was a total of 4 respiratory events: 4 hypopneas.     The APNEA/HYPOPNEA INDEX (AHI) was 0.7 /hour.  0 events occurred in  REM sleep and 4 events in NREM. The REM AHI was 0 /hour versus a non-REM AHI of 0.8 /hour.  The patient spent all 359 minutes of total sleep time in the supine position and 0 minutes in non-supine.  OXYGEN SATURATION & C02:  The baseline 02 saturation was 97%, with the lowest being 90%. Time spent below 89% saturation equaled 0 minutes. The arousals were noted as: 36 were spontaneous, 0 were associated with PLMs, and only 2 were associated with respiratory events. The patient had a total of 0 Periodic Limb Movements.   Audio and video analysis did not show any abnormal or unusual movements, behaviors, phonations  or vocalizations. The patient took bathroom breaks. Snoring was not noted. EKG was in keeping with normal sinus rhythm (NSR).Post-study, the patient indicated that sleep was of poorer quality than usual.  The patient was fitted with a nasal mask, an ESON in small size, by [employer].  DIAGNOSIS 1. 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 No Sleep Related Hypoxemia was noted under BiPAP.  PLANS/RECOMMENDATIONS: 1. The patient should avoid evening sedatives, hypnotics, and alcoholic beverage consumption before bedtime  2. CPAP therapy compliance is defined as 4 hours of nightly use.   DISCUSSION: The patient was fitted with a nasal mask, an ESON in small size, by [employer]. I will order the interface, headgear and machine for the patient.   A follow up appointment will be scheduled in the Sleep Clinic at [facility].   Please call [phone] with any questions.     I certify that I have reviewed the entire raw data recording prior to the issuance of this report in accordance with the Standards of Accreditation of the American Academy of Sleep Medicine (AASM)  Larey Seat, M.D.  09-23-2018 Diplomat, American Board of Psychiatry and Neurology  Diplomat, [place] of Sleep Medicine Medical Director, [place] [facility] at [facility]

## 2018-09-28 ENCOUNTER — Telehealth: Payer: Self-pay | Admitting: Neurology

## 2018-09-28 ENCOUNTER — Other Ambulatory Visit: Payer: Self-pay | Admitting: Neurology

## 2018-09-28 DIAGNOSIS — Z9989 Dependence on other enabling machines and devices: Secondary | ICD-10-CM

## 2018-09-28 DIAGNOSIS — Z9981 Dependence on supplemental oxygen: Secondary | ICD-10-CM

## 2018-09-28 NOTE — Telephone Encounter (Signed)
I called pt and spoke to her husband. I advised pt's husband that Dr. Brett Fairy reviewed their sleep study results and found that pt was treated on the BiPAP at a pressure of 12/8cm water pressure. Dr. Brett Fairy recommends that pt gets a new machine and set at 12/8 cm water pressure. I reviewed PAP compliance expectations with the pt. Pt is agreeable to starting a CPAP. I advised pt that an order will be sent to a DME, [facility], and [facility] will call the pt within about one week after they file with the pt's insurance. [facility] will show the pt how to use the machine, fit for masks, and troubleshoot the CPAP if needed. A follow up appt will need to be made for insurance purposes with NP or MD  In 61-90 days after starting the new machine. Pt's husband verbalized understanding of results. Pt's husband had no questions at this time but was encouraged to call back if questions arise. I have sent the order to [facility] and have received confirmation that they have received the order.

## 2018-09-28 NOTE — Telephone Encounter (Signed)
-----   Message from Larey Seat, MD sent at 09/23/2018 12:22 PM EDT ----- HST was performed  07-12-2018 while not on PAP or oxygen to get new supplies and a new machine. The AHI overall was 19.1/h, REM AHI was 40/h. patient failed auto CPAP. DIAGNOSIS 1. 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 No Sleep Related Hypoxemia was noted under BiPAP.  PLANS/RECOMMENDATIONS: 1. The patient should avoid evening sedatives, hypnotics, and alcoholic beverage consumption before bedtime  2. CPAP therapy compliance is defined as 4 hours of nightly use.   DISCUSSION: The patient was fitted with a nasal mask, an ESON in small size, by [employer]. I will order the interface, headgear and machine for the patient.

## 2018-09-29 ENCOUNTER — Telehealth (INDEPENDENT_AMBULATORY_CARE_PROVIDER_SITE_OTHER): Payer: Medicare Other | Admitting: Family Medicine

## 2018-09-29 ENCOUNTER — Other Ambulatory Visit: Payer: Self-pay

## 2018-09-29 ENCOUNTER — Encounter (INDEPENDENT_AMBULATORY_CARE_PROVIDER_SITE_OTHER): Payer: Self-pay | Admitting: Family Medicine

## 2018-09-29 DIAGNOSIS — K529 Noninfective gastroenteritis and colitis, unspecified: Secondary | ICD-10-CM | POA: Diagnosis not present

## 2018-09-29 DIAGNOSIS — Z6832 Body mass index (BMI) 32.0-32.9, adult: Secondary | ICD-10-CM

## 2018-09-29 DIAGNOSIS — E669 Obesity, unspecified: Secondary | ICD-10-CM

## 2018-10-03 ENCOUNTER — Ambulatory Visit: Payer: Medicare Other | Admitting: Family Medicine

## 2018-10-03 NOTE — Progress Notes (Signed)
Office: [phone]  /  Fax: [phone] TeleHealth Visit:  Heather [name] has verbally consented to this TeleHealth visit today. The patient is located at home, the provider is located at the [facility] office. The participants in this visit include the listed provider, patient, and the patient's husband, Heather [name]. The visit was conducted today via telephone call (Doxy failed and changed to telephone call).  HPI:   Chief Complaint: OBESITY Heather [name] is here to discuss her progress with her obesity treatment plan. She is on the Category 2 plan and is following her eating plan approximately 70% of the time. She states she is exercising with Gazelle or walking 30 minutes 3-4 times per week. [name] states her weight is 194 today. She had vomiting and diarrhea yesterday but is feeling better today. She would like to go back to her low carb plan, but she really misses fruit. We were unable to weigh the patient today for this TeleHealth visit. She states her weight today is 194 lbs. She has lost 73 lbs since starting treatment with [name].  Gastroenteritis [name] had vomiting and diarrhea yesterday for most of the day. She wasn't able to keep much down but is starting to feel better.  ASSESSMENT AND PLAN:  Gastroenteritis, acute  Class 1 obesity with serious comorbidity and body mass index (BMI) of 32.0 to 32.9 in adult, unspecified obesity type  PLAN:  Gastroenteritis [name] was advised it was likely due to food poisoning and to stick with mild food for today and start back on her protein and vegetables tomorrow. She agrees and will follow-up with our clinic as directed.  I spent > than 50% of the 15 minute visit on counseling as documented in the note.  Obesity [name] is currently in the action stage of change. As such, her goal is to continue with weight loss efforts. She has agreed to change to following the Paleo diet.  [name] has been instructed to work up to a goal of 150  minutes of combined cardio and strengthening exercise per week for weight loss and overall health benefits. We discussed the following Behavioral Modification Strategies today: increase H20 intake and keeping healthy foods in the home.  [name] has agreed to follow-up with our clinic in 2 weeks. She was informed of the importance of frequent follow-up visits to maximize her success with intensive lifestyle modifications for her multiple health conditions.  ALLERGIES: Allergies  Allergen Reactions  . Aspirin   . Coconut Oil   . Lisinopril     REACTION: cough  . Metoprolol Itching  . Peanut-Containing Drug Products   . Penicillins     REACTION: rash  . Prednisone   . Strawberry Extract     EDICATIONS: Current Outpatient Medications on File Prior to Visit  Medication Sig Dispense Refill  . acetaminophen (TYLENOL) 500 MG tablet Take 650 mg by mouth 3 (three) times daily as needed for pain.     . Calcium Carbonate-Vitamin D (CALCIUM 600 + D PO) Take 2 tablets by mouth daily.      . clopidogrel (PLAVIX) 75 MG tablet Take 1 tablet (75 mg total) by mouth daily. 90 tablet 3  . Ferrous Sulfate (IRON) 325 (65 FE) MG TABS Take 1 tablet by mouth daily.      . fexofenadine (ALLEGRA) 180 MG tablet Take 180 mg by mouth daily.    . furosemide (LASIX) 40 MG tablet Take 1 tablet (40 mg total) by mouth daily. 90 tablet 3  .  gabapentin (NEURONTIN) 100 MG capsule Take 2 capsules (200 mg total) by mouth at bedtime. 180 capsule 3  . glucosamine-chondroitin 500-400 MG tablet Take 1 tablet by mouth 2 (two) times daily.      [name] ipratropium (ATROVENT) 0.06 % nasal spray Place 2 sprays into both nostrils 4 (four) times daily. 15 mL 12  . loratadine (CLARITIN) 10 MG tablet Take 1 tablet (10 mg total) by mouth daily. 30 tablet 11  . losartan (COZAAR) 25 MG tablet TAKE 1 TABLET EVERY DAY    . metFORMIN (GLUCOPHAGE) 500 MG tablet Take 2 tablets (1,000 mg total) by mouth daily with breakfast. 180 tablet 3  . modafinil  (PROVIGIL) 200 MG tablet modafinil 200 mg tablet    . Multiple Vitamin (MULTIVITAMIN) tablet Take 1 tablet by mouth daily.      . naproxen sodium (ANAPROX) 220 MG tablet Take 220 mg by mouth as needed.    . Omega-3 Fatty Acids (FISH OIL) 1000 MG CAPS Take 1 capsule by mouth daily.    [name] omeprazole (PRILOSEC) 20 MG capsule Take 20 mg by mouth daily as needed.     . potassium chloride SA (KLOR-CON M20) 20 MEQ tablet Take 1 tablet (20 mEq total) by mouth daily. 90 tablet 3  . pyridOXINE (VITAMIN B-6) 100 MG tablet Take 200 mg by mouth daily.    . rosuvastatin (CRESTOR) 40 MG tablet Take 1 tablet (40 mg total) by mouth daily. At night 90 tablet 3  . SF 5000 PLUS 1.1 % CREA dental cream   0  . SUMAtriptan (IMITREX) 20 MG/ACT nasal spray sumatriptan 20 mg/actuation nasal spray    . topiramate (TOPAMAX) 100 MG tablet Take 1 tablet (100 mg total) by mouth 2 (two) times daily. 180 tablet 3  . Turmeric 500 MG TABS Take 2 tablets by mouth daily.    [name] venlafaxine XR (EFFEXOR XR) 37.5 MG 24 hr capsule Take 1 capsule (37.5 mg total) by mouth daily with breakfast. 90 capsule 1  . vitamin E 200 UNIT capsule Take 200 Units by mouth daily.     Current Facility-Administered Medications on File Prior to Visit  Medication Dose Route Frequency Provider Last Rate Last Dose  . 0.9 %  sodium chloride infusion  500 mL Intravenous Continuous Nandigam, Venia Minks, MD        PAST MEDICAL HISTORY: Past Medical History:  Diagnosis Date  . Abdominal hernia   . Abnormal Pap smear   . ALLERGIC RHINITIS 10/22/2006  . Allergy   . ANEMIA 12/18/2008  . Arthritis    scoliosis moderate deg changes lumbar Xray 11/04/17   . ASYMPTOMATIC POSTMENOPAUSAL STATUS 11/22/2007  . Back pain   . CAD (coronary artery disease)    in LAD  . Complication of anesthesia   . Degenerative arthritis   . DIABETES MELLITUS, TYPE II 01/13/2007  . Dyslipidemia   . Fibroid   . Frequent headaches   . GERD 07/20/2007  . GOITER, MULTINODULAR  07/20/2007  . Headache(784.0) 07/20/2007  . HEARING LOSS 11/22/2007  . Hiatal hernia   . Hiatal hernia    large  . History of chicken pox   . Hx of colposcopy with cervical biopsy   . HYPERCHOLESTEROLEMIA 01/13/2007  . Hyperglycemia   . HYPERTENSION 10/22/2006  . Lung nodule    unchanged since 05/26/13   . Migraines   . NASH (nonalcoholic steatohepatitis)   . Nocturnal hypoxemia 02/17/2013  . Obesity   . Obstructive sleep apnea   .  OSA on CPAP    per neurology  . OSTEOARTHRITIS 10/22/2006  . Other chronic nonalcoholic liver disease 0/06/5995  . Sedimentation rate elevation   . Sleep apnea    on cpap  . Urine incontinence   . UTI (urinary tract infection)     PAST SURGICAL HISTORY: Past Surgical History:  Procedure Laterality Date  . BREAST SURGERY  1984   Breast reduction b/l   . CATARACT EXTRACTION, BILATERAL    . DEXA  08/2005  . DILATION AND CURETTAGE OF UTERUS    . ELECTROCARDIOGRAM  10/15/2006  . ESOPHAGOGASTRODUODENOSCOPY  12/08/2005  . FOOT SURGERY     hammertoe and bunion 05/2017   . Stress Cardiolite  10/21/2005  . sweat gland removal    . WISDOM TOOTH EXTRACTION      SOCIAL HISTORY: Social History   Tobacco Use  . Smoking status: Former Smoker    Packs/day: 2.00    Years: 20.00    Pack years: 40.00    Types: Cigarettes    Quit date: 03/03/1979    Years since quitting: 39.6  . Smokeless tobacco: Never Used  Substance Use Topics  . Alcohol use: Yes    Comment: rare  . Drug use: No    FAMILY HISTORY: Family History  Problem Relation Age of Onset  . Asthma Mother   . Depression Mother   . Bipolar disorder Mother   . Dementia Mother   . Arthritis Mother   . Breast cancer Mother        primary  . Colon cancer Mother        mets from breast  . Cancer Mother        colon  . Hypertension Father   . Migraines Father   . Cancer Maternal Aunt        ?  [name] Heart disease Maternal Grandmother   . Cancer Maternal Grandfather        stomach ?   ROS:  Review of Systems  Gastrointestinal:       Positive for gastroenteritis yesterday with vomiting and diarrhea.   PHYSICAL EXAM: Pt in no acute distress  RECENT LABS AND TESTS: BMET    Component Value Date/Time   NA 141 04/27/2018 1219   K 4.0 04/27/2018 1219   CL 104 04/27/2018 1219   CO2 22 04/27/2018 1219   GLUCOSE 78 04/27/2018 1219   GLUCOSE 116 (H) 03/05/2016 0958   BUN 13 04/27/2018 1219   CREATININE 0.67 04/27/2018 1219   CREATININE 0.81 12/28/2011 0853   CALCIUM 9.2 04/27/2018 1219   GFRNONAA 89 04/27/2018 1219   GFRAA 102 04/27/2018 1219   Lab Results  Component Value Date   HGBA1C 5.6 04/27/2018   HGBA1C 6.0 (H) 12/01/2017   HGBA1C 5.7 (H) 07/29/2017   HGBA1C 5.9 03/15/2017   HGBA1C 6.3 (H) 12/23/2016   Lab Results  Component Value Date   INSULIN 3.6 04/27/2018   INSULIN 5.6 12/01/2017   INSULIN 6.8 07/29/2017   INSULIN 20.9 12/23/2016   CBC    Component Value Date/Time   WBC 4.5 04/26/2018 1602   RBC 4.19 04/26/2018 1602   HGB 12.9 04/26/2018 1602   HGB 12.4 12/01/2017 1146   HCT 38.3 04/26/2018 1602   HCT 39.5 12/01/2017 1146   PLT 248.0 04/26/2018 1602   MCV 91.2 04/26/2018 1602   MCV 91 12/01/2017 1146   MCH 28.5 12/01/2017 1146   MCH 29.4 12/28/2011 0853   MCHC 33.7 04/26/2018 1602   RDW  16.7 (H) 04/26/2018 1602   RDW 16.2 (H) 12/01/2017 1146   LYMPHSABS 1.3 04/26/2018 1602   LYMPHSABS 1.2 12/01/2017 1146   MONOABS 0.4 04/26/2018 1602   EOSABS 0.1 04/26/2018 1602   EOSABS 0.1 12/01/2017 1146   BASOSABS 0.0 04/26/2018 1602   BASOSABS 0.0 12/01/2017 1146   Iron/TIBC/Ferritin/ %Sat    Component Value Date/Time   IRON 43 (L) 04/26/2018 1602   IRON 38 12/23/2016 0956   TIBC 249 (L) 04/26/2018 1602   TIBC 271 12/23/2016 0956   FERRITIN 60 04/26/2018 1602   FERRITIN 40 12/23/2016 0956   IRONPCTSAT 17 04/26/2018 1602   Lipid Panel     Component Value Date/Time   CHOL 207 (H) 04/27/2018 1219   TRIG 63 04/27/2018 1219   HDL 72  04/27/2018 1219   CHOLHDL 3 03/05/2016 0958   VLDL 26.0 03/05/2016 0958   LDLCALC 122 (H) 04/27/2018 1219   Hepatic Function Panel     Component Value Date/Time   PROT 7.0 04/27/2018 1219   ALBUMIN 4.0 04/27/2018 1219   AST 18 04/27/2018 1219   ALT 12 04/27/2018 1219   ALKPHOS 61 04/27/2018 1219   BILITOT <0.2 04/27/2018 1219   BILIDIR 0.1 02/15/2015 1010   IBILI 0.2 12/28/2011 0853      Component Value Date/Time   TSH 1.41 04/26/2018 1602   TSH 1.170 12/23/2016 0956   TSH 1.76 03/05/2016 0958   Results for NAHLA, LUKIN (MRN [id number]) as of 10/03/2018 12:06  Ref. Range 04/27/2018 12:19  Vitamin D, 25-Hydroxy Latest Ref Range: 30.0 - 100.0 ng/mL 36.9   I, Michaelene Song, am acting as Location manager for [name], MD I have reviewed the above documentation for accuracy and completeness, and I agree with the above. -[name], MD

## 2018-10-13 ENCOUNTER — Other Ambulatory Visit: Payer: Self-pay

## 2018-10-13 ENCOUNTER — Telehealth (INDEPENDENT_AMBULATORY_CARE_PROVIDER_SITE_OTHER): Payer: Medicare Other | Admitting: Family Medicine

## 2018-10-13 ENCOUNTER — Encounter (INDEPENDENT_AMBULATORY_CARE_PROVIDER_SITE_OTHER): Payer: Self-pay | Admitting: Family Medicine

## 2018-10-13 DIAGNOSIS — Z6832 Body mass index (BMI) 32.0-32.9, adult: Secondary | ICD-10-CM | POA: Diagnosis not present

## 2018-10-13 DIAGNOSIS — K529 Noninfective gastroenteritis and colitis, unspecified: Secondary | ICD-10-CM

## 2018-10-13 DIAGNOSIS — E669 Obesity, unspecified: Secondary | ICD-10-CM

## 2018-10-19 ENCOUNTER — Other Ambulatory Visit: Payer: Self-pay

## 2018-10-19 ENCOUNTER — Encounter: Payer: Self-pay | Admitting: Family Medicine

## 2018-10-19 ENCOUNTER — Ambulatory Visit (INDEPENDENT_AMBULATORY_CARE_PROVIDER_SITE_OTHER): Payer: Medicare Other | Admitting: Family Medicine

## 2018-10-19 DIAGNOSIS — M170 Bilateral primary osteoarthritis of knee: Secondary | ICD-10-CM | POA: Diagnosis not present

## 2018-10-19 NOTE — Progress Notes (Signed)
[name] [address] [place], [place] [address] Phone: [phone] Subjective:   Heather [name], am serving as a scribe for Dr. Hulan Saas. Right elbow and  CC: Bilateral knee pain  EXH:[id number]   07/26/2018 Patient given injections again today.  Patient wants to avoid any surgical intervention.  Could be a candidate for Visco supplementation but has not had significant benefit with that over the years.  Patient is unwilling to wear the custom bracing on a regular basis.  We discussed home exercise, icing regimen, which activities to do which wants to avoid.  Patient will start to increase activity as tolerated.  Follow-up again 4 to 8 weeks  Update 10/19/2018 Heather [name] is a 73 y.o. female coming in with complaint of bilateral knee pain. Pain is not as bad as it was last visit. States that she feels that the fluid is less in her body and this causes an improvement in her pain. Left is worse.      Past Medical History:  Diagnosis Date  . Abdominal hernia   . Abnormal Pap smear   . ALLERGIC RHINITIS 10/22/2006  . Allergy   . ANEMIA 12/18/2008  . Arthritis    scoliosis moderate deg changes lumbar Xray 11/04/17   . ASYMPTOMATIC POSTMENOPAUSAL STATUS 11/22/2007  . Back pain   . CAD (coronary artery disease)    in LAD  . Complication of anesthesia   . Degenerative arthritis   . DIABETES MELLITUS, TYPE II 01/13/2007  . Dyslipidemia   . Fibroid   . Frequent headaches   . GERD 07/20/2007  . GOITER, MULTINODULAR 07/20/2007  . Headache(784.0) 07/20/2007  . HEARING LOSS 11/22/2007  . Hiatal hernia   . Hiatal hernia    large  . History of chicken pox   . Hx of colposcopy with cervical biopsy   . HYPERCHOLESTEROLEMIA 01/13/2007  . Hyperglycemia   . HYPERTENSION 10/22/2006  . Lung nodule    unchanged since 05/26/13   . Migraines   . NASH (nonalcoholic steatohepatitis)   . Nocturnal hypoxemia 02/17/2013  . Obesity   . Obstructive sleep apnea    . OSA on CPAP    per neurology  . OSTEOARTHRITIS 10/22/2006  . Other chronic nonalcoholic liver disease 10/09/1749  . Sedimentation rate elevation   . Sleep apnea    on cpap  . Urine incontinence   . UTI (urinary tract infection)    Past Surgical History:  Procedure Laterality Date  . BREAST SURGERY  1984   Breast reduction b/l   . CATARACT EXTRACTION, BILATERAL    . DEXA  08/2005  . DILATION AND CURETTAGE OF UTERUS    . ELECTROCARDIOGRAM  10/15/2006  . ESOPHAGOGASTRODUODENOSCOPY  12/08/2005  . FOOT SURGERY     hammertoe and bunion 05/2017   . Stress Cardiolite  10/21/2005  . sweat gland removal    . WISDOM TOOTH EXTRACTION     Social History   Socioeconomic History  . Marital status: Married    Spouse name: [name]  . Number of children: 2  . Years of education: College  . Highest education level: Not on file  Occupational History  . Occupation: Retired  Scientific laboratory technician  . Financial resource strain: Not on file  . Food insecurity    Worry: Not on file    Inability: Not on file  . Transportation needs    Medical: Not on file    Non-medical: Not on file  Tobacco Use  .  Smoking status: Former Smoker    Packs/day: 2.00    Years: 20.00    Pack years: 40.00    Types: Cigarettes    Quit date: 03/03/1979    Years since quitting: 39.6  . Smokeless tobacco: Never Used  Substance and Sexual Activity  . Alcohol use: Yes    Comment: rare  . Drug use: [name]  . Sexual activity: Yes    Birth control/protection: Surgical  Lifestyle  . Physical activity    Days per week: Not on file    Minutes per session: Not on file  . Stress: Not on file  Relationships  . Social Herbalist on phone: Not on file    Gets together: Not on file    Attends religious service: Not on file    Active member of club or organization: Not on file    Attends meetings of clubs or organizations: Not on file    Relationship status: Not on file  Other Topics Concern  . Not on file  Social  History Narrative   Patient is married [name]).   Patient has two children; 3 pregnancies 2 live births    Patient is retired Designer, jewellery, Chiropodist    Patient has a Financial risk analyst.   Patient is right-handed.   Patient lives at home with family.   Caffeine Use: 2 soda every other day   Former smoker 20+ years ago 2 ppd quit in 50s   [name] guns, wears seat belt, safe in relationship    Allergies  Allergen Reactions  . Aspirin   . Coconut Oil   . Lisinopril     REACTION: cough  . Metoprolol Itching  . Peanut-Containing Drug Products   . Penicillins     REACTION: rash  . Prednisone   . Strawberry Extract    Family History  Problem Relation Age of Onset  . Asthma Mother   . Depression Mother   . Bipolar disorder Mother   . Dementia Mother   . Arthritis Mother   . Breast cancer Mother        primary  . Colon cancer Mother        mets from breast  . Cancer Mother        colon  . Hypertension Father   . Migraines Father   . Cancer Maternal Aunt        ?  [name] Heart disease Maternal Grandmother   . Cancer Maternal Grandfather        stomach ?    Current Outpatient Medications (Endocrine & Metabolic):  .  metFORMIN (GLUCOPHAGE) 500 MG tablet, Take 2 tablets (1,000 mg total) by mouth daily with breakfast.   Current Outpatient Medications (Cardiovascular):  .  furosemide (LASIX) 40 MG tablet, Take 1 tablet (40 mg total) by mouth daily. [name]  losartan (COZAAR) 25 MG tablet, TAKE 1 TABLET EVERY DAY .  rosuvastatin (CRESTOR) 40 MG tablet, Take 1 tablet (40 mg total) by mouth daily. At night   Current Outpatient Medications (Respiratory):  .  fexofenadine (ALLEGRA) 180 MG tablet, Take 180 mg by mouth daily. [name]  ipratropium (ATROVENT) 0.06 % nasal spray, Place 2 sprays into both nostrils 4 (four) times daily. [name]  loratadine (CLARITIN) 10 MG tablet, Take 1 tablet (10 mg total) by mouth daily.   Current Outpatient Medications  (Analgesics):  .  acetaminophen (TYLENOL) 500 MG tablet, Take 650 mg by mouth 3 (three) times daily as needed  for pain.  .  naproxen sodium (ANAPROX) 220 MG tablet, Take 220 mg by mouth as needed. .  SUMAtriptan (IMITREX) 20 MG/ACT nasal spray, sumatriptan 20 mg/actuation nasal spray   Current Outpatient Medications (Hematological):  .  clopidogrel (PLAVIX) 75 MG tablet, Take 1 tablet (75 mg total) by mouth daily. .  Ferrous Sulfate (IRON) 325 (65 FE) MG TABS, Take 1 tablet by mouth daily.     Current Outpatient Medications (Other):  [name]  Calcium Carbonate-Vitamin D (CALCIUM 600 + D PO), Take 2 tablets by mouth daily.   [name]  gabapentin (NEURONTIN) 100 MG capsule, Take 2 capsules (200 mg total) by mouth at bedtime. [name]  glucosamine-chondroitin 500-400 MG tablet, Take 1 tablet by mouth 2 (two) times daily.   .  modafinil (PROVIGIL) 200 MG tablet, modafinil 200 mg tablet .  Multiple Vitamin (MULTIVITAMIN) tablet, Take 1 tablet by mouth daily.   .  Omega-3 Fatty Acids (FISH OIL) 1000 MG CAPS, Take 1 capsule by mouth daily. [name]  omeprazole (PRILOSEC) 20 MG capsule, Take 20 mg by mouth daily as needed.  .  potassium chloride SA (KLOR-CON M20) 20 MEQ tablet, Take 1 tablet (20 mEq total) by mouth daily. [name]  pyridOXINE (VITAMIN B-6) 100 MG tablet, Take 200 mg by mouth daily. .  SF 5000 PLUS 1.1 % CREA dental cream,  .  topiramate (TOPAMAX) 100 MG tablet, Take 1 tablet (100 mg total) by mouth 2 (two) times daily. .  Turmeric 500 MG TABS, Take 2 tablets by mouth daily. [name]  venlafaxine XR (EFFEXOR XR) 37.5 MG 24 hr capsule, Take 1 capsule (37.5 mg total) by mouth daily with breakfast. .  vitamin E 200 UNIT capsule, Take 200 Units by mouth daily.  Current Facility-Administered Medications (Other):  .  0.9 %  sodium chloride infusion    Past medical history, social, surgical and family history all reviewed in electronic medical record.  [name] pertanent information unless stated regarding to the chief complaint.    Review of Systems:  [name] headache, visual changes, nausea, vomiting, diarrhea, constipation, dizziness, abdominal pain, skin rash, fevers, chills, night sweats, weight loss, swollen lymph nodes, body aches, joint swelling, , chest pain, shortness of breath, mood changes.  Positive muscle aches  Objective  Blood pressure 110/60, pulse 78, height 5' 5"  (1.651 m), SpO2 98 %.    General: [name] apparent distress alert and oriented x3 mood and affect normal, dressed appropriately.  HEENT: Pupils equal, extraocular movements intact  Respiratory: Patient's speak in full sentences and does not appear short of breath  Cardiovascular: 1+ lower extremity edema, non tender, [name] erythema  Skin: Warm dry intact with [name] signs of infection or rash on extremities or on axial skeleton.  Abdomen: Soft nontender  Neuro: Cranial nerves II through XII are intact, neurovascularly intact in all extremities with 2+ DTRs and 2+ pulses.  Lymph: [name] lymphadenopathy of posterior or anterior cervical chain or axillae bilaterally.  Gait antalgic MSK:  tender with mild limited range of motion and stability and symmetric strength and tone of shoulders, elbows, wrist, hip, xt and ankles bilaterally.   Knee: Bilateral valgus deformity noted.  Abnormal thigh to calf ratio.  Tender to palpation over medial and PF joint line.  ROM full in flexion and extension and lower leg rotation. instability with valgus force.  painful patellar compression. Patellar glide with moderate crepitus. Patellar and quadriceps tendons unremarkable. Hamstring and quadriceps strength is normal.  After informed written and verbal consent, patient was seated  on exam table. Right knee was prepped with alcohol swab and utilizing anterolateral approach, patient's right knee space was injected with 4:1  marcaine 0.5%: Kenalog 59m/dL. Patient tolerated the procedure well without immediate complications.  After informed written and verbal consent, patient  was seated on exam table. Left knee was prepped with alcohol swab and utilizing anterolateral approach, patient's left knee space was injected with 4:1  marcaine 0.5%: Kenalog 431mdL. Patient tolerated the procedure well without immediate complications.   Impression and Recommendations:     This case required medical decision making of moderate complexity. The above documentation has been reviewed and is accurate and complete ZaLyndal PulleyDO       Note: This dictation was prepared with Dragon dictation along with smaller phrase technology. Any transcriptional errors that result from this process are unintentional.

## 2018-10-19 NOTE — Assessment & Plan Note (Signed)
Patient continues to make progress with her weight loss.  I do believe that that is more beneficial than anything else.  We discussed icing regimen and home exercises, which activities to do which wants to avoid.  Patient is to follow-up with me again in 10 weeks otherwise.  Could do viscosupplementation if necessary

## 2018-10-19 NOTE — Patient Instructions (Signed)
Good to see you  Ice is your friend Injected the knees again today  We will continue every 10 weeks or if needed can do the gel injections again

## 2018-10-20 NOTE — Progress Notes (Signed)
Office: [phone]  /  Fax: [phone] TeleHealth Visit:  Heather [name] has verbally consented to this TeleHealth visit today. The patient is located at home, the provider is located at the [facility] office. The participants in this visit include the listed provider and patient. Heather [name] was unable to use realtime audiovisual technology today and the telehealth visit was conducted via telephone.   HPI:   Chief Complaint: OBESITY Heather [name] is here to discuss her progress with her obesity treatment plan. She is on the lower carbohydrate, vegetable and lean protein rich diet plan and is following her eating plan approximately 94 % of the time. She states she is on the elliptical for 30 minutes 3-4 times per week. Heather [name] is up 3 lbs from last visit, and she weighs 197 lbs today. Her last visit she had gastroenteritis so likely the weight gain is due to increased water. She had a lot of musculoskeletal abdominal pain after vomiting, but she is feeling better now. She had been increasing simple carbohydrates while recovering. She is ready to go back on the low carbohydrate plan.  We were unable to weigh the patient today for this TeleHealth visit. She feels as if she has gained 3 lbs since her last visit. She has lost 73 lbs since starting treatment with [name].  Gastroenteritis Heather [name] has been recovering from significant vomiting for 2 days and she suspects it was due to eating a lot of onions, which recently had a recall for salmonella. She has been eating mild starches which her GI track calms down, but she feels she is better now.  ASSESSMENT AND PLAN:  Gastroenteritis, acute  Class 1 obesity with serious comorbidity and body mass index (BMI) of 32.0 to 32.9 in adult, unspecified obesity type  PLAN:  Gastroenteritis Heather [name] was advised that it is ok to add back vegetables and lean protein, but still avoid spicy or acidic foods for another few days until she has fully recovered.  Heather [name] agrees to follow up with our clinic in 3 weeks.  I spent > than 50% of the 15 minute visit on counseling as documented in the note.  Obesity Heather [name] is currently in the action stage of change. As such, her goal is to continue with weight loss efforts She has agreed to follow a lower carbohydrate, vegetable and lean protein rich diet plan Heather [name] has been instructed to work up to a goal of 150 minutes of combined cardio and strengthening exercise per week for weight loss and overall health benefits. We discussed the following Behavioral Modification Strategies today: increasing lean protein intake, decreasing simple carbohydrates  and work on meal planning and easy cooking plans   Heather [name] has agreed to follow up with our clinic in 3 weeks. She was informed of the importance of frequent follow up visits to maximize her success with intensive lifestyle modifications for her multiple health conditions.  ALLERGIES: Allergies  Allergen Reactions  . Aspirin   . Coconut Oil   . Lisinopril     REACTION: cough  . Metoprolol Itching  . Peanut-Containing Drug Products   . Penicillins     REACTION: rash  . Prednisone   . Strawberry Extract     MEDICATIONS: Current Outpatient Medications on File Prior to Visit  Medication Sig Dispense Refill  . acetaminophen (TYLENOL) 500 MG tablet Take 650 mg by mouth 3 (three) times daily as needed for pain.     . Calcium Carbonate-Vitamin D (CALCIUM 600 + D PO) Take  2 tablets by mouth daily.      . clopidogrel (PLAVIX) 75 MG tablet Take 1 tablet (75 mg total) by mouth daily. 90 tablet 3  . Ferrous Sulfate (IRON) 325 (65 FE) MG TABS Take 1 tablet by mouth daily.      . fexofenadine (ALLEGRA) 180 MG tablet Take 180 mg by mouth daily.    . furosemide (LASIX) 40 MG tablet Take 1 tablet (40 mg total) by mouth daily. 90 tablet 3  . gabapentin (NEURONTIN) 100 MG capsule Take 2 capsules (200 mg total) by mouth at bedtime. 180 capsule 3  . glucosamine-chondroitin  500-400 MG tablet Take 1 tablet by mouth 2 (two) times daily.      [name] ipratropium (ATROVENT) 0.06 % nasal spray Place 2 sprays into both nostrils 4 (four) times daily. 15 mL 12  . loratadine (CLARITIN) 10 MG tablet Take 1 tablet (10 mg total) by mouth daily. 30 tablet 11  . losartan (COZAAR) 25 MG tablet TAKE 1 TABLET EVERY DAY    . metFORMIN (GLUCOPHAGE) 500 MG tablet Take 2 tablets (1,000 mg total) by mouth daily with breakfast. 180 tablet 3  . modafinil (PROVIGIL) 200 MG tablet modafinil 200 mg tablet    . Multiple Vitamin (MULTIVITAMIN) tablet Take 1 tablet by mouth daily.      . naproxen sodium (ANAPROX) 220 MG tablet Take 220 mg by mouth as needed.    . Omega-3 Fatty Acids (FISH OIL) 1000 MG CAPS Take 1 capsule by mouth daily.    [name] omeprazole (PRILOSEC) 20 MG capsule Take 20 mg by mouth daily as needed.     . potassium chloride SA (KLOR-CON M20) 20 MEQ tablet Take 1 tablet (20 mEq total) by mouth daily. 90 tablet 3  . pyridOXINE (VITAMIN B-6) 100 MG tablet Take 200 mg by mouth daily.    . rosuvastatin (CRESTOR) 40 MG tablet Take 1 tablet (40 mg total) by mouth daily. At night 90 tablet 3  . SF 5000 PLUS 1.1 % CREA dental cream   0  . SUMAtriptan (IMITREX) 20 MG/ACT nasal spray sumatriptan 20 mg/actuation nasal spray    . topiramate (TOPAMAX) 100 MG tablet Take 1 tablet (100 mg total) by mouth 2 (two) times daily. 180 tablet 3  . Turmeric 500 MG TABS Take 2 tablets by mouth daily.    [name] venlafaxine XR (EFFEXOR XR) 37.5 MG 24 hr capsule Take 1 capsule (37.5 mg total) by mouth daily with breakfast. 90 capsule 1  . vitamin E 200 UNIT capsule Take 200 Units by mouth daily.     Current Facility-Administered Medications on File Prior to Visit  Medication Dose Route Frequency Provider Last Rate Last Dose  . 0.9 %  sodium chloride infusion  500 mL Intravenous Continuous Nandigam, Venia Minks, MD        PAST MEDICAL HISTORY: Past Medical History:  Diagnosis Date  . Abdominal hernia   . Abnormal  Pap smear   . ALLERGIC RHINITIS 10/22/2006  . Allergy   . ANEMIA 12/18/2008  . Arthritis    scoliosis moderate deg changes lumbar Xray 11/04/17   . ASYMPTOMATIC POSTMENOPAUSAL STATUS 11/22/2007  . Back pain   . CAD (coronary artery disease)    in LAD  . Complication of anesthesia   . Degenerative arthritis   . DIABETES MELLITUS, TYPE II 01/13/2007  . Dyslipidemia   . Fibroid   . Frequent headaches   . GERD 07/20/2007  . GOITER, MULTINODULAR 07/20/2007  . Headache(784.0) 07/20/2007  .  HEARING LOSS 11/22/2007  . Hiatal hernia   . Hiatal hernia    large  . History of chicken pox   . Hx of colposcopy with cervical biopsy   . HYPERCHOLESTEROLEMIA 01/13/2007  . Hyperglycemia   . HYPERTENSION 10/22/2006  . Lung nodule    unchanged since 05/26/13   . Migraines   . NASH (nonalcoholic steatohepatitis)   . Nocturnal hypoxemia 02/17/2013  . Obesity   . Obstructive sleep apnea   . OSA on CPAP    per neurology  . OSTEOARTHRITIS 10/22/2006  . Other chronic nonalcoholic liver disease 8/75/6433  . Sedimentation rate elevation   . Sleep apnea    on cpap  . Urine incontinence   . UTI (urinary tract infection)     PAST SURGICAL HISTORY: Past Surgical History:  Procedure Laterality Date  . BREAST SURGERY  1984   Breast reduction b/l   . CATARACT EXTRACTION, BILATERAL    . DEXA  08/2005  . DILATION AND CURETTAGE OF UTERUS    . ELECTROCARDIOGRAM  10/15/2006  . ESOPHAGOGASTRODUODENOSCOPY  12/08/2005  . FOOT SURGERY     hammertoe and bunion 05/2017   . Stress Cardiolite  10/21/2005  . sweat gland removal    . WISDOM TOOTH EXTRACTION      SOCIAL HISTORY: Social History   Tobacco Use  . Smoking status: Former Smoker    Packs/day: 2.00    Years: 20.00    Pack years: 40.00    Types: Cigarettes    Quit date: 03/03/1979    Years since quitting: 39.6  . Smokeless tobacco: Never Used  Substance Use Topics  . Alcohol use: Yes    Comment: rare  . Drug use: No    FAMILY HISTORY: Family  History  Problem Relation Age of Onset  . Asthma Mother   . Depression Mother   . Bipolar disorder Mother   . Dementia Mother   . Arthritis Mother   . Breast cancer Mother        primary  . Colon cancer Mother        mets from breast  . Cancer Mother        colon  . Hypertension Father   . Migraines Father   . Cancer Maternal Aunt        ?  [name] Heart disease Maternal Grandmother   . Cancer Maternal Grandfather        stomach ?    ROS: Review of Systems  Constitutional: Negative for weight loss.    PHYSICAL EXAM: Pt in no acute distress  RECENT LABS AND TESTS: BMET    Component Value Date/Time   NA 141 04/27/2018 1219   K 4.0 04/27/2018 1219   CL 104 04/27/2018 1219   CO2 22 04/27/2018 1219   GLUCOSE 78 04/27/2018 1219   GLUCOSE 116 (H) 03/05/2016 0958   BUN 13 04/27/2018 1219   CREATININE 0.67 04/27/2018 1219   CREATININE 0.81 12/28/2011 0853   CALCIUM 9.2 04/27/2018 1219   GFRNONAA 89 04/27/2018 1219   GFRAA 102 04/27/2018 1219   Lab Results  Component Value Date   HGBA1C 5.6 04/27/2018   HGBA1C 6.0 (H) 12/01/2017   HGBA1C 5.7 (H) 07/29/2017   HGBA1C 5.9 03/15/2017   HGBA1C 6.3 (H) 12/23/2016   Lab Results  Component Value Date   INSULIN 3.6 04/27/2018   INSULIN 5.6 12/01/2017   INSULIN 6.8 07/29/2017   INSULIN 20.9 12/23/2016   CBC    Component Value Date/Time  WBC 4.5 04/26/2018 1602   RBC 4.19 04/26/2018 1602   HGB 12.9 04/26/2018 1602   HGB 12.4 12/01/2017 1146   HCT 38.3 04/26/2018 1602   HCT 39.5 12/01/2017 1146   PLT 248.0 04/26/2018 1602   MCV 91.2 04/26/2018 1602   MCV 91 12/01/2017 1146   MCH 28.5 12/01/2017 1146   MCH 29.4 12/28/2011 0853   MCHC 33.7 04/26/2018 1602   RDW 16.7 (H) 04/26/2018 1602   RDW 16.2 (H) 12/01/2017 1146   LYMPHSABS 1.3 04/26/2018 1602   LYMPHSABS 1.2 12/01/2017 1146   MONOABS 0.4 04/26/2018 1602   EOSABS 0.1 04/26/2018 1602   EOSABS 0.1 12/01/2017 1146   BASOSABS 0.0 04/26/2018 1602   BASOSABS 0.0  12/01/2017 1146   Iron/TIBC/Ferritin/ %Sat    Component Value Date/Time   IRON 43 (L) 04/26/2018 1602   IRON 38 12/23/2016 0956   TIBC 249 (L) 04/26/2018 1602   TIBC 271 12/23/2016 0956   FERRITIN 60 04/26/2018 1602   FERRITIN 40 12/23/2016 0956   IRONPCTSAT 17 04/26/2018 1602   Lipid Panel     Component Value Date/Time   CHOL 207 (H) 04/27/2018 1219   TRIG 63 04/27/2018 1219   HDL 72 04/27/2018 1219   CHOLHDL 3 03/05/2016 0958   VLDL 26.0 03/05/2016 0958   LDLCALC 122 (H) 04/27/2018 1219   Hepatic Function Panel     Component Value Date/Time   PROT 7.0 04/27/2018 1219   ALBUMIN 4.0 04/27/2018 1219   AST 18 04/27/2018 1219   ALT 12 04/27/2018 1219   ALKPHOS 61 04/27/2018 1219   BILITOT <0.2 04/27/2018 1219   BILIDIR 0.1 02/15/2015 1010   IBILI 0.2 12/28/2011 0853      Component Value Date/Time   TSH 1.41 04/26/2018 1602   TSH 1.170 12/23/2016 0956   TSH 1.76 03/05/2016 0958      I, [name], am acting as transcriptionist for [name], MD I have reviewed the above documentation for accuracy and completeness, and I agree with the above. -[name], MD

## 2018-10-25 ENCOUNTER — Other Ambulatory Visit: Payer: Self-pay

## 2018-10-25 ENCOUNTER — Encounter: Payer: Self-pay | Admitting: Podiatry

## 2018-10-25 ENCOUNTER — Ambulatory Visit (INDEPENDENT_AMBULATORY_CARE_PROVIDER_SITE_OTHER): Payer: Medicare Other | Admitting: Podiatry

## 2018-10-25 VITALS — Temp 98.6°F

## 2018-10-25 DIAGNOSIS — M79676 Pain in unspecified toe(s): Secondary | ICD-10-CM

## 2018-10-25 DIAGNOSIS — B351 Tinea unguium: Secondary | ICD-10-CM | POA: Diagnosis not present

## 2018-10-25 DIAGNOSIS — Q828 Other specified congenital malformations of skin: Secondary | ICD-10-CM | POA: Diagnosis not present

## 2018-10-26 ENCOUNTER — Encounter: Payer: Self-pay | Admitting: Podiatry

## 2018-10-26 NOTE — Progress Notes (Signed)
She presents today chief complaint of painfully elongated toenails and calluses of plantar aspect of the forefoot bilaterally.  States that they have gotten worse over the past several months.  Objective: Vital signs are stable alert and oriented x3.  Pulses are palpable.  She has thick yellow dystrophic clinical mycotic nails which are painful to palpation.  Her calluses are thick and painful with porokeratotic type lesions surrounding tylomas.  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  Plan: Debridement of toenails 1 through 5 bilaterally.  Debridement of all reactive hyperkeratoses bilateral.

## 2018-10-31 ENCOUNTER — Other Ambulatory Visit: Payer: Self-pay | Admitting: Internal Medicine

## 2018-10-31 ENCOUNTER — Encounter: Payer: Self-pay | Admitting: Internal Medicine

## 2018-10-31 DIAGNOSIS — K219 Gastro-esophageal reflux disease without esophagitis: Secondary | ICD-10-CM

## 2018-10-31 DIAGNOSIS — K449 Diaphragmatic hernia without obstruction or gangrene: Secondary | ICD-10-CM

## 2018-10-31 MED ORDER — DEXILANT 30 MG PO CPDR: 30.0000 mg | DELAYED_RELEASE_CAPSULE | Freq: Every day | ORAL | 3 refills | Status: DC

## 2018-11-02 ENCOUNTER — Telehealth (INDEPENDENT_AMBULATORY_CARE_PROVIDER_SITE_OTHER): Payer: Medicare Other | Admitting: Family Medicine

## 2018-11-02 ENCOUNTER — Encounter (INDEPENDENT_AMBULATORY_CARE_PROVIDER_SITE_OTHER): Payer: Self-pay | Admitting: Family Medicine

## 2018-11-02 ENCOUNTER — Other Ambulatory Visit: Payer: Self-pay

## 2018-11-02 DIAGNOSIS — Z6832 Body mass index (BMI) 32.0-32.9, adult: Secondary | ICD-10-CM

## 2018-11-02 DIAGNOSIS — R1032 Left lower quadrant pain: Secondary | ICD-10-CM | POA: Diagnosis not present

## 2018-11-02 DIAGNOSIS — E669 Obesity, unspecified: Secondary | ICD-10-CM

## 2018-11-03 ENCOUNTER — Encounter: Payer: Self-pay | Admitting: Internal Medicine

## 2018-11-03 ENCOUNTER — Other Ambulatory Visit: Payer: Self-pay | Admitting: Internal Medicine

## 2018-11-03 DIAGNOSIS — K219 Gastro-esophageal reflux disease without esophagitis: Secondary | ICD-10-CM

## 2018-11-03 MED ORDER — PANTOPRAZOLE SODIUM 40 MG PO TBEC: 40.0000 mg | DELAYED_RELEASE_TABLET | Freq: Every day | ORAL | 3 refills | Status: DC

## 2018-11-05 ENCOUNTER — Encounter (INDEPENDENT_AMBULATORY_CARE_PROVIDER_SITE_OTHER): Payer: Self-pay | Admitting: Family Medicine

## 2018-11-05 ENCOUNTER — Encounter: Payer: Self-pay | Admitting: Internal Medicine

## 2018-11-08 NOTE — Telephone Encounter (Signed)
Fyi.

## 2018-11-08 NOTE — Progress Notes (Signed)
Office: [phone]  /  Fax: [phone] TeleHealth Visit:  Heather [name] has verbally consented to this TeleHealth visit today. The patient is located at home, the provider is located at the [facility] office. The participants in this visit include the listed provider and patient. The visit was conducted today via telephone call (FaceTime failed - changed to telephone call).  HPI:   Chief Complaint: OBESITY Heather [name] is here to discuss her progress with her obesity treatment plan. She is on the low carb plan and is following her eating plan approximately 0% of the time. She states she is exercising on the elliptical 30 minutes 5 times per week. Heather [name] hasn't been following her plan well due to increased abdominal pain. She has been eating approximately 2 meals a day and did some celebration eating for her birthday, but feels she has maintained her weight. We were unable to weigh the patient today for this TeleHealth visit. She feels as if she has maintained her weight since her last visit. She has lost 73 lbs since starting treatment with [name].  Left Lower Quadrant Pain Heather [name] notes intermittent left lower quadrant pain, worse x2 weeks, better with decreased food, better after a large bowel movement but not after small. No F/C/D. She has an appointment with a GI PA in 2 weeks.  ASSESSMENT AND PLAN:  LLQ pain  Class 1 obesity with serious comorbidity and body mass index (BMI) of 32.0 to 32.9 in adult, unspecified obesity type  PLAN:  Left Lower Quadrant Pain Heather [name] was encouraged to try OTC MiraLax BID with her next bowel movement and then decrease to to once daily. She will keep her GI appointment in 2 weeks.  I spent > than 50% of the 25 minute visit on counseling as documented in the note.  Obesity Heather [name] is currently in the action stage of change. As such, her goal is to continue with weight loss efforts. She has agreed to portion control better and make smarter food  choices, such as increase vegetables and decrease simple carbohydrates.  Heather [name] has been instructed to work up to a goal of 150 minutes of combined cardio and strengthening exercise per week for weight loss and overall health benefits. We discussed the following Behavioral Modification Strategies today: increase H20 intake and increase high fiber foods.  Heather [name] has agreed to follow-up with our clinic in 2-3 weeks. She was informed of the importance of frequent follow-up visits to maximize her success with intensive lifestyle modifications for her multiple health conditions.  ALLERGIES: Allergies  Allergen Reactions  . Aspirin   . Coconut Oil   . Lisinopril     REACTION: cough  . Metoprolol Itching  . Peanut-Containing Drug Products   . Penicillins     REACTION: rash  . Prednisone   . Strawberry Extract     MEDICATIONS: Current Outpatient Medications on File Prior to Visit  Medication Sig Dispense Refill  . acetaminophen (TYLENOL) 500 MG tablet Take 650 mg by mouth 3 (three) times daily as needed for pain.     . Calcium Carbonate-Vitamin D (CALCIUM 600 + D PO) Take 2 tablets by mouth daily.      . clopidogrel (PLAVIX) 75 MG tablet Take 1 tablet (75 mg total) by mouth daily. 90 tablet 3  . Ferrous Sulfate (IRON) 325 (65 FE) MG TABS Take 1 tablet by mouth daily.      . fexofenadine (ALLEGRA) 180 MG tablet Take 180 mg by mouth daily.    [name]  furosemide (LASIX) 40 MG tablet Take 1 tablet (40 mg total) by mouth daily. 90 tablet 3  . gabapentin (NEURONTIN) 100 MG capsule Take 2 capsules (200 mg total) by mouth at bedtime. 180 capsule 3  . glucosamine-chondroitin 500-400 MG tablet Take 1 tablet by mouth 2 (two) times daily.      [name] ipratropium (ATROVENT) 0.06 % nasal spray Place 2 sprays into both nostrils 4 (four) times daily. 15 mL 12  . loratadine (CLARITIN) 10 MG tablet Take 1 tablet (10 mg total) by mouth daily. 30 tablet 11  . losartan (COZAAR) 25 MG tablet TAKE 1 TABLET EVERY DAY    .  metFORMIN (GLUCOPHAGE) 500 MG tablet Take 2 tablets (1,000 mg total) by mouth daily with breakfast. 180 tablet 3  . modafinil (PROVIGIL) 200 MG tablet modafinil 200 mg tablet    . Multiple Vitamin (MULTIVITAMIN) tablet Take 1 tablet by mouth daily.      . naproxen sodium (ANAPROX) 220 MG tablet Take 220 mg by mouth as needed.    . Omega-3 Fatty Acids (FISH OIL) 1000 MG CAPS Take 1 capsule by mouth daily.    . potassium chloride SA (KLOR-CON M20) 20 MEQ tablet Take 1 tablet (20 mEq total) by mouth daily. 90 tablet 3  . pyridOXINE (VITAMIN B-6) 100 MG tablet Take 200 mg by mouth daily.    . rosuvastatin (CRESTOR) 40 MG tablet Take 1 tablet (40 mg total) by mouth daily. At night 90 tablet 3  . SF 5000 PLUS 1.1 % CREA dental cream   0  . SUMAtriptan (IMITREX) 20 MG/ACT nasal spray sumatriptan 20 mg/actuation nasal spray    . topiramate (TOPAMAX) 100 MG tablet Take 1 tablet (100 mg total) by mouth 2 (two) times daily. 180 tablet 3  . Turmeric 500 MG TABS Take 2 tablets by mouth daily.    [name] venlafaxine XR (EFFEXOR XR) 37.5 MG 24 hr capsule Take 1 capsule (37.5 mg total) by mouth daily with breakfast. 90 capsule 1  . vitamin E 200 UNIT capsule Take 200 Units by mouth daily.     Current Facility-Administered Medications on File Prior to Visit  Medication Dose Route Frequency Provider Last Rate Last Dose  . 0.9 %  sodium chloride infusion  500 mL Intravenous Continuous Nandigam, Venia Minks, MD        PAST MEDICAL HISTORY: Past Medical History:  Diagnosis Date  . Abdominal hernia   . Abnormal Pap smear   . ALLERGIC RHINITIS 10/22/2006  . Allergy   . ANEMIA 12/18/2008  . Arthritis    scoliosis moderate deg changes lumbar Xray 11/04/17   . ASYMPTOMATIC POSTMENOPAUSAL STATUS 11/22/2007  . Back pain   . CAD (coronary artery disease)    in LAD  . Complication of anesthesia   . Degenerative arthritis   . DIABETES MELLITUS, TYPE II 01/13/2007  . Dyslipidemia   . Fibroid   . Frequent headaches   .  GERD 07/20/2007  . GOITER, MULTINODULAR 07/20/2007  . Headache(784.0) 07/20/2007  . HEARING LOSS 11/22/2007  . Hiatal hernia   . Hiatal hernia    large  . History of chicken pox   . Hx of colposcopy with cervical biopsy   . HYPERCHOLESTEROLEMIA 01/13/2007  . Hyperglycemia   . HYPERTENSION 10/22/2006  . Lung nodule    unchanged since 05/26/13   . Migraines   . NASH (nonalcoholic steatohepatitis)   . Nocturnal hypoxemia 02/17/2013  . Obesity   . Obstructive sleep apnea   .  OSA on CPAP    per neurology  . OSTEOARTHRITIS 10/22/2006  . Other chronic nonalcoholic liver disease 1/61/0960  . Sedimentation rate elevation   . Sleep apnea    on cpap  . Urine incontinence   . UTI (urinary tract infection)     PAST SURGICAL HISTORY: Past Surgical History:  Procedure Laterality Date  . BREAST SURGERY  1984   Breast reduction b/l   . CATARACT EXTRACTION, BILATERAL    . DEXA  08/2005  . DILATION AND CURETTAGE OF UTERUS    . ELECTROCARDIOGRAM  10/15/2006  . ESOPHAGOGASTRODUODENOSCOPY  12/08/2005  . FOOT SURGERY     hammertoe and bunion 05/2017   . Stress Cardiolite  10/21/2005  . sweat gland removal    . WISDOM TOOTH EXTRACTION      SOCIAL HISTORY: Social History   Tobacco Use  . Smoking status: Former Smoker    Packs/day: 2.00    Years: 20.00    Pack years: 40.00    Types: Cigarettes    Quit date: 03/03/1979    Years since quitting: 39.7  . Smokeless tobacco: Never Used  Substance Use Topics  . Alcohol use: Yes    Comment: rare  . Drug use: No    FAMILY HISTORY: Family History  Problem Relation Age of Onset  . Asthma Mother   . Depression Mother   . Bipolar disorder Mother   . Dementia Mother   . Arthritis Mother   . Breast cancer Mother        primary  . Colon cancer Mother        mets from breast  . Cancer Mother        colon  . Hypertension Father   . Migraines Father   . Cancer Maternal Aunt        ?  [name] Heart disease Maternal Grandmother   . Cancer Maternal  Grandfather        stomach ?   ROS: Review of Systems  Gastrointestinal: Positive for abdominal pain (left lower quadrant).   PHYSICAL EXAM: Pt in no acute distress  RECENT LABS AND TESTS: BMET    Component Value Date/Time   NA 141 04/27/2018 1219   K 4.0 04/27/2018 1219   CL 104 04/27/2018 1219   CO2 22 04/27/2018 1219   GLUCOSE 78 04/27/2018 1219   GLUCOSE 116 (H) 03/05/2016 0958   BUN 13 04/27/2018 1219   CREATININE 0.67 04/27/2018 1219   CREATININE 0.81 12/28/2011 0853   CALCIUM 9.2 04/27/2018 1219   GFRNONAA 89 04/27/2018 1219   GFRAA 102 04/27/2018 1219   Lab Results  Component Value Date   HGBA1C 5.6 04/27/2018   HGBA1C 6.0 (H) 12/01/2017   HGBA1C 5.7 (H) 07/29/2017   HGBA1C 5.9 03/15/2017   HGBA1C 6.3 (H) 12/23/2016   Lab Results  Component Value Date   INSULIN 3.6 04/27/2018   INSULIN 5.6 12/01/2017   INSULIN 6.8 07/29/2017   INSULIN 20.9 12/23/2016   CBC    Component Value Date/Time   WBC 4.5 04/26/2018 1602   RBC 4.19 04/26/2018 1602   HGB 12.9 04/26/2018 1602   HGB 12.4 12/01/2017 1146   HCT 38.3 04/26/2018 1602   HCT 39.5 12/01/2017 1146   PLT 248.0 04/26/2018 1602   MCV 91.2 04/26/2018 1602   MCV 91 12/01/2017 1146   MCH 28.5 12/01/2017 1146   MCH 29.4 12/28/2011 0853   MCHC 33.7 04/26/2018 1602   RDW 16.7 (H) 04/26/2018 1602   RDW  16.2 (H) 12/01/2017 1146   LYMPHSABS 1.3 04/26/2018 1602   LYMPHSABS 1.2 12/01/2017 1146   MONOABS 0.4 04/26/2018 1602   EOSABS 0.1 04/26/2018 1602   EOSABS 0.1 12/01/2017 1146   BASOSABS 0.0 04/26/2018 1602   BASOSABS 0.0 12/01/2017 1146   Iron/TIBC/Ferritin/ %Sat    Component Value Date/Time   IRON 43 (L) 04/26/2018 1602   IRON 38 12/23/2016 0956   TIBC 249 (L) 04/26/2018 1602   TIBC 271 12/23/2016 0956   FERRITIN 60 04/26/2018 1602   FERRITIN 40 12/23/2016 0956   IRONPCTSAT 17 04/26/2018 1602   Lipid Panel     Component Value Date/Time   CHOL 207 (H) 04/27/2018 1219   TRIG 63 04/27/2018 1219    HDL 72 04/27/2018 1219   CHOLHDL 3 03/05/2016 0958   VLDL 26.0 03/05/2016 0958   LDLCALC 122 (H) 04/27/2018 1219   Hepatic Function Panel     Component Value Date/Time   PROT 7.0 04/27/2018 1219   ALBUMIN 4.0 04/27/2018 1219   AST 18 04/27/2018 1219   ALT 12 04/27/2018 1219   ALKPHOS 61 04/27/2018 1219   BILITOT <0.2 04/27/2018 1219   BILIDIR 0.1 02/15/2015 1010   IBILI 0.2 12/28/2011 0853      Component Value Date/Time   TSH 1.41 04/26/2018 1602   TSH 1.170 12/23/2016 0956   TSH 1.76 03/05/2016 0958   Results for WENDY, HOBACK (MRN [id number]) as of 11/08/2018 07:49  Ref. Range 04/27/2018 12:19  Vitamin D, 25-Hydroxy Latest Ref Range: 30.0 - 100.0 ng/mL 36.9   I, Michaelene Song, am acting as Location manager for [name], MD I have reviewed the above documentation for accuracy and completeness, and I agree with the above. -[name], MD

## 2018-11-13 ENCOUNTER — Encounter: Payer: Self-pay | Admitting: Internal Medicine

## 2018-11-15 ENCOUNTER — Other Ambulatory Visit: Payer: Self-pay | Admitting: *Deleted

## 2018-11-15 ENCOUNTER — Other Ambulatory Visit: Payer: Self-pay | Admitting: Internal Medicine

## 2018-11-15 ENCOUNTER — Ambulatory Visit (INDEPENDENT_AMBULATORY_CARE_PROVIDER_SITE_OTHER): Payer: Medicare Other | Admitting: Nurse Practitioner

## 2018-11-15 ENCOUNTER — Encounter: Payer: Self-pay | Admitting: Nurse Practitioner

## 2018-11-15 VITALS — BP 106/58 | HR 94 | Temp 98.2°F | Ht 65.0 in | Wt 198.0 lb

## 2018-11-15 DIAGNOSIS — K449 Diaphragmatic hernia without obstruction or gangrene: Secondary | ICD-10-CM

## 2018-11-15 DIAGNOSIS — K219 Gastro-esophageal reflux disease without esophagitis: Secondary | ICD-10-CM

## 2018-11-15 DIAGNOSIS — R609 Edema, unspecified: Secondary | ICD-10-CM

## 2018-11-15 MED ORDER — POTASSIUM CHLORIDE CRYS ER 20 MEQ PO TBCR: 20.0000 meq | EXTENDED_RELEASE_TABLET | Freq: Every day | ORAL | 3 refills | Status: DC

## 2018-11-15 MED ORDER — PANTOPRAZOLE SODIUM 20 MG PO TBEC: 20.0000 mg | DELAYED_RELEASE_TABLET | Freq: Every day | ORAL | 11 refills | Status: DC

## 2018-11-15 NOTE — Progress Notes (Signed)
Chief Complaint:  Abdominal cramping    IMPRESSION and PLAN:    57.  73 year old female here with recent gastroenteritis type illness (nausea, vomiting, lower abdominal cramping, loose stool).   Possibly coincidental but most of her symptoms (even lower cramps) started to resolve after PCP changed her PPI to pantoprazole. Since she is feeling better there is no need for GI workup.   2. Large paraesophageal hernia, chronic  She has been evaluated by surgery a couple of times but apparenlty surgery has recommended given absence of symptoms.   -I asked patient to let us know ASAP should she develop chest pain, dysphagia, nausea/vomiting.   3. [facility] of colon cancer and personal history of adenomatous colon polyps April 2018. Due for surveillance colonoscopy due April 2023    HPI:     Patient is a 73 year old female with PMH significant for DM 2, CAD, obesity, OSA, and HTN. , previously known to Dr. Sharlett Iles then followed by Dr. Deatra Ina.  Now under the care of Dr. Silverio Decamp since last colonoscopy April 2018 . Patient's GI history is significant for a large paraesophageal hernia found on EGD back in 2007.  She was not really seen here again until 2015 when referred here for evaluation of large hiatal hernia on chest CT scan.  At that time patient was relatively asymptomatic but interested in options so we obtain an UGI series and referred her for surgical evaluation.Upper GI series showed large paraesophageal hernia with displacement of the stomach into the right hemithorax.  Patient says she saw surgeon in [place] but surgery not recommended, especially since she wasn't having any problems. At some point time since then patient also Dr. Kreg Shropshire in [place], [place] but didn't continue workup given logistics of getting to and from [place]. Still struggles with swallowing pills sometimes.  No problem swallowing solids or liquids. Patient had been doing fine from a GI standpoint until late July when she  developed gastroenteritis symptoms thought to be related to food poisoning from onions. She had nausea, vomiting, 'indigestion", lower abdominal pain and diarrhea though she may have been taking Miralax at the time. She called PCP with symptoms, her PPI was changed to Pantoprazole and she hasn't had any further N/V/indigestion or lower abdominal pain since.   She feels the diarrhea may have gotten worse with the switch but even that is resolving. No heartburn or regurgitation.   Review of systems:     No chest pain, no SOB, no fevers, no urinary sx   Past Medical History:  Diagnosis Date   Abdominal hernia    Abnormal Pap smear    ALLERGIC RHINITIS 10/22/2006   Allergy    ANEMIA 12/18/2008   Arthritis    scoliosis moderate deg changes lumbar Xray 11/04/17    ASYMPTOMATIC POSTMENOPAUSAL STATUS 11/22/2007   Back pain    CAD (coronary artery disease)    in LAD   Complication of anesthesia    Degenerative arthritis    DIABETES MELLITUS, TYPE II 01/13/2007   Dyslipidemia    Fibroid    Frequent headaches    GERD 07/20/2007   GOITER, MULTINODULAR 07/20/2007   Headache(784.0) 07/20/2007   HEARING LOSS 11/22/2007   Hiatal hernia    Hiatal hernia    large   History of chicken pox    Hx of colposcopy with cervical biopsy    HYPERCHOLESTEROLEMIA 01/13/2007   Hyperglycemia    HYPERTENSION 10/22/2006   Lung nodule    unchanged since  05/26/13    Migraines    NASH (nonalcoholic steatohepatitis)    Nocturnal hypoxemia 02/17/2013   Obesity    Obstructive sleep apnea    OSA on CPAP    per neurology   OSTEOARTHRITIS 10/22/2006   Other chronic nonalcoholic liver disease 08/21/2977   Sedimentation rate elevation    Sleep apnea    on cpap   Urine incontinence    UTI (urinary tract infection)     Patient's surgical history, family medical history, social history, medications and allergies were all reviewed in Epic   Creatinine clearance cannot be calculated  (Patient's most recent lab result is older than the maximum 21 days allowed.)  Current Outpatient Medications  Medication Sig Dispense Refill   acetaminophen (TYLENOL) 500 MG tablet Take 650 mg by mouth 3 (three) times daily as needed for pain.      Calcium Carbonate-Vitamin D (CALCIUM 600 + D PO) Take 2 tablets by mouth daily.       clopidogrel (PLAVIX) 75 MG tablet Take 1 tablet (75 mg total) by mouth daily. 90 tablet 3   Ferrous Sulfate (IRON) 325 (65 FE) MG TABS Take 1 tablet by mouth daily.       fexofenadine (ALLEGRA) 180 MG tablet Take 180 mg by mouth daily.     furosemide (LASIX) 40 MG tablet Take 1 tablet (40 mg total) by mouth daily. 90 tablet 3   gabapentin (NEURONTIN) 100 MG capsule Take 2 capsules (200 mg total) by mouth at bedtime. 180 capsule 3   glucosamine-chondroitin 500-400 MG tablet Take 1 tablet by mouth 2 (two) times daily.       ipratropium (ATROVENT) 0.06 % nasal spray Place 2 sprays into both nostrils 4 (four) times daily. 15 mL 12   loratadine (CLARITIN) 10 MG tablet Take 1 tablet (10 mg total) by mouth daily. 30 tablet 11   metFORMIN (GLUCOPHAGE) 500 MG tablet Take 2 tablets (1,000 mg total) by mouth daily with breakfast. 180 tablet 3   modafinil (PROVIGIL) 200 MG tablet modafinil 200 mg tablet     Multiple Vitamin (MULTIVITAMIN) tablet Take 1 tablet by mouth daily.       naproxen sodium (ANAPROX) 220 MG tablet Take 220 mg by mouth as needed.     Omega-3 Fatty Acids (FISH OIL) 1000 MG CAPS Take 1 capsule by mouth daily.     pantoprazole (PROTONIX) 40 MG tablet Take 1 tablet (40 mg total) by mouth daily. 30 minutes before food 90 tablet 3   potassium chloride SA (KLOR-CON M20) 20 MEQ tablet Take 1 tablet (20 mEq total) by mouth daily. 90 tablet 3   pyridOXINE (VITAMIN B-6) 100 MG tablet Take 200 mg by mouth daily.     rosuvastatin (CRESTOR) 40 MG tablet Take 1 tablet (40 mg total) by mouth daily. At night 90 tablet 3   SF 5000 PLUS 1.1 % CREA  dental cream   0   SUMAtriptan (IMITREX) 20 MG/ACT nasal spray sumatriptan 20 mg/actuation nasal spray     topiramate (TOPAMAX) 100 MG tablet Take 1 tablet (100 mg total) by mouth 2 (two) times daily. 180 tablet 3   Turmeric 500 MG TABS Take 2 tablets by mouth daily.     venlafaxine XR (EFFEXOR XR) 37.5 MG 24 hr capsule Take 1 capsule (37.5 mg total) by mouth daily with breakfast. 90 capsule 1   vitamin E 200 UNIT capsule Take 200 Units by mouth daily.     Current Facility-Administered Medications  Medication Dose  Route Frequency Provider Last Rate Last Dose   0.9 %  sodium chloride infusion  500 mL Intravenous Continuous Nandigam, Venia Minks, MD        Physical Exam:     BP (!) 106/58    Pulse 94    Temp 98.2 F (36.8 C)    Ht 5' 5"  (1.651 m)    Wt 198 lb (89.8 kg)    BMI 32.95 kg/m   GENERAL:  Pleasant female in NAD PSYCH: : Cooperative, normal affect EENT:  conjunctiva pink, mucous membranes moist, neck supple without masses CARDIAC:  RRR,  no peripheral edema PULM: Normal respiratory effort, lungs CTA bilaterally, no wheezing ABDOMEN:  Nondistended, soft, nontender. No obvious masses, no hepatomegaly,  normal bowel sounds SKIN:  turgor, no lesions seen Musculoskeletal:  Normal muscle tone, normal strength NEURO: Alert and oriented x 3, no focal neurologic deficits   Tye Savoy , NP 11/15/2018, 2:31 PM

## 2018-11-15 NOTE — Patient Instructions (Addendum)
If you are age 72 or older, your body mass index should be between 23-30. Your Body mass index is 32.95 kg/m. If this is out of the aforementioned range listed, please consider follow up with your Primary Care Provider.  If you are age 67 or younger, your body mass index should be between 19-25. Your Body mass index is 32.95 kg/m. If this is out of the aformentioned range listed, please consider follow up with your Primary Care Provider.   Continue Pantoprazole daily.  You have been given samples of Align.  Try for one month; if this doesn't help can stop.  Follow up as needed.  Thank you for choosing me and [facility].   Tye Savoy, NP

## 2018-11-15 NOTE — Telephone Encounter (Signed)
[facility] last filled by Dr. [name] on 01/15/18 for # 90 with 3 refills.  LOV: 09/08/18 NOV: 01/18/19

## 2018-11-16 ENCOUNTER — Encounter (INDEPENDENT_AMBULATORY_CARE_PROVIDER_SITE_OTHER): Payer: Self-pay | Admitting: Family Medicine

## 2018-11-16 ENCOUNTER — Telehealth (INDEPENDENT_AMBULATORY_CARE_PROVIDER_SITE_OTHER): Payer: Medicare Other | Admitting: Family Medicine

## 2018-11-16 ENCOUNTER — Encounter: Payer: Self-pay | Admitting: Nurse Practitioner

## 2018-11-16 ENCOUNTER — Other Ambulatory Visit: Payer: Self-pay

## 2018-11-16 DIAGNOSIS — K5901 Slow transit constipation: Secondary | ICD-10-CM

## 2018-11-16 DIAGNOSIS — E669 Obesity, unspecified: Secondary | ICD-10-CM | POA: Diagnosis not present

## 2018-11-16 DIAGNOSIS — Z6832 Body mass index (BMI) 32.0-32.9, adult: Secondary | ICD-10-CM

## 2018-11-21 NOTE — Progress Notes (Signed)
Office: [phone]  /  Fax: [phone] TeleHealth Visit:  Heather [name] has verbally consented to this TeleHealth visit today. The patient is located at home, the provider is located at the [employer] [facility] office. The participants in this visit include the listed provider and patient. Keiasia was unable to use realtime audiovisual technology today and the telehealth visit was conducted via telephone.   HPI:   Chief Complaint: OBESITY [name] is here to discuss her progress with her obesity treatment plan. She is on the Pescatarian eating plan and is following her eating plan approximately 0 % of the time. She states she is on the elliptical for 30 minutes 4 times per week. Heather [name] states she is doing well maintaining her weight in the last 2-3 weeks. She has been struggling with GI issues and hasn't concentrated on her diet as much, but has added in exercise on her elliptical 3-4 times per week for 30 minutes.  We were unable to weigh the patient today for this TeleHealth visit. She feels as if she has maintained her weight since her last visit. She has lost 73 lbs since starting treatment with [name].  Constipation [name] notes constipation for the last few weeks. She has started using miralax and noted decreased abdominal cramping. She also started Protonix and felt she mostly started having diarrhea, so she stopped and is feeling better now. She denies hematochezia or melena. She denies drinking less H20 recently.  ASSESSMENT AND PLAN:  No diagnosis found.  PLAN:  Constipation [name] was informed decrease bowel movement frequency is normal while losing weight, but stools should not be hard or painful. [name] is to use miralax as needed. She was advised to increase her H20 intake and work on increasing her fiber intake. High fiber foods were discussed today. [name] agrees to follow up with our clinic in 2 weeks.  I spent > than 50% of the 25 minute visit on counseling as documented  in the note.  Obesity Heather [name] is currently in the action stage of change. As such, her goal is to continue with weight loss efforts She has agreed to follow a lower carbohydrate, vegetable and lean protein rich diet plan Shima has been instructed to work up to a goal of 150 minutes of combined cardio and strengthening exercise per week for weight loss and overall health benefits. We discussed the following Behavioral Modification Strategies today: increasing vegetables and increase H20 intake   Heather [name] has agreed to follow up with our clinic in 2 weeks. She was informed of the importance of frequent follow up visits to maximize her success with intensive lifestyle modifications for her multiple health conditions.  ALLERGIES: Allergies  Allergen Reactions  . Aspirin   . Coconut Oil   . Lisinopril     REACTION: cough  . Metoprolol Itching  . Peanut-Containing Drug Products   . Penicillins     REACTION: rash  . Prednisone   . Strawberry Extract     MEDICATIONS: Current Outpatient Medications on File Prior to Visit  Medication Sig Dispense Refill  . acetaminophen (TYLENOL) 500 MG tablet Take 650 mg by mouth 3 (three) times daily as needed for pain.     . Calcium Carbonate-Vitamin D (CALCIUM 600 + D PO) Take 2 tablets by mouth daily.      . clopidogrel (PLAVIX) 75 MG tablet Take 1 tablet (75 mg total) by mouth daily. 90 tablet 3  . Ferrous Sulfate (IRON) 325 (65 FE) MG TABS Take 1  tablet by mouth daily.      . fexofenadine (ALLEGRA) 180 MG tablet Take 180 mg by mouth daily.    . furosemide (LASIX) 40 MG tablet Take 1 tablet (40 mg total) by mouth daily. 90 tablet 3  . gabapentin (NEURONTIN) 100 MG capsule Take 2 capsules (200 mg total) by mouth at bedtime. 180 capsule 3  . glucosamine-chondroitin 500-400 MG tablet Take 1 tablet by mouth 2 (two) times daily.      [name] ipratropium (ATROVENT) 0.06 % nasal spray Place 2 sprays into both nostrils 4 (four) times daily. 15 mL 12  . loratadine  (CLARITIN) 10 MG tablet Take 1 tablet (10 mg total) by mouth daily. 30 tablet 11  . metFORMIN (GLUCOPHAGE) 500 MG tablet Take 2 tablets (1,000 mg total) by mouth daily with breakfast. 180 tablet 3  . modafinil (PROVIGIL) 200 MG tablet modafinil 200 mg tablet    . Multiple Vitamin (MULTIVITAMIN) tablet Take 1 tablet by mouth daily.      . naproxen sodium (ANAPROX) 220 MG tablet Take 220 mg by mouth as needed.    . Omega-3 Fatty Acids (FISH OIL) 1000 MG CAPS Take 1 capsule by mouth daily.    . pantoprazole (PROTONIX) 20 MG tablet Take 1 tablet (20 mg total) by mouth daily. 30 minutes before food 30 tablet 11  . potassium chloride SA (KLOR-CON M20) 20 MEQ tablet Take 1 tablet (20 mEq total) by mouth daily. 90 tablet 3  . pyridOXINE (VITAMIN B-6) 100 MG tablet Take 200 mg by mouth daily.    . rosuvastatin (CRESTOR) 40 MG tablet Take 1 tablet (40 mg total) by mouth daily. At night 90 tablet 3  . SF 5000 PLUS 1.1 % CREA dental cream   0  . SUMAtriptan (IMITREX) 20 MG/ACT nasal spray sumatriptan 20 mg/actuation nasal spray    . topiramate (TOPAMAX) 100 MG tablet Take 1 tablet (100 mg total) by mouth 2 (two) times daily. 180 tablet 3  . Turmeric 500 MG TABS Take 2 tablets by mouth daily.    [name] venlafaxine XR (EFFEXOR XR) 37.5 MG 24 hr capsule Take 1 capsule (37.5 mg total) by mouth daily with breakfast. 90 capsule 1  . vitamin E 200 UNIT capsule Take 200 Units by mouth daily.     Current Facility-Administered Medications on File Prior to Visit  Medication Dose Route Frequency Provider Last Rate Last Dose  . 0.9 %  sodium chloride infusion  500 mL Intravenous Continuous Nandigam, Venia Minks, MD        PAST MEDICAL HISTORY: Past Medical History:  Diagnosis Date  . Abdominal hernia   . Abnormal Pap smear   . ALLERGIC RHINITIS 10/22/2006  . Allergy   . ANEMIA 12/18/2008  . Arthritis    scoliosis moderate deg changes lumbar Xray 11/04/17   . ASYMPTOMATIC POSTMENOPAUSAL STATUS 11/22/2007  . Back pain    . CAD (coronary artery disease)    in LAD  . Complication of anesthesia   . Degenerative arthritis   . DIABETES MELLITUS, TYPE II 01/13/2007  . Dyslipidemia   . Fibroid   . Frequent headaches   . GERD 07/20/2007  . GOITER, MULTINODULAR 07/20/2007  . Headache(784.0) 07/20/2007  . HEARING LOSS 11/22/2007  . Hiatal hernia   . Hiatal hernia    large  . History of chicken pox   . Hx of colposcopy with cervical biopsy   . HYPERCHOLESTEROLEMIA 01/13/2007  . Hyperglycemia   . HYPERTENSION 10/22/2006  . Lung  nodule    unchanged since 05/26/13   . Migraines   . NASH (nonalcoholic steatohepatitis)   . Nocturnal hypoxemia 02/17/2013  . Obesity   . Obstructive sleep apnea   . OSA on CPAP    per neurology  . OSTEOARTHRITIS 10/22/2006  . Other chronic nonalcoholic liver disease 0/99/8338  . Sedimentation rate elevation   . Sleep apnea    on cpap  . Urine incontinence   . UTI (urinary tract infection)     PAST SURGICAL HISTORY: Past Surgical History:  Procedure Laterality Date  . BREAST SURGERY  1984   Breast reduction b/l   . CATARACT EXTRACTION, BILATERAL    . DEXA  08/2005  . DILATION AND CURETTAGE OF UTERUS    . ELECTROCARDIOGRAM  10/15/2006  . ESOPHAGOGASTRODUODENOSCOPY  12/08/2005  . FOOT SURGERY     hammertoe and bunion 05/2017   . Stress Cardiolite  10/21/2005  . sweat gland removal    . WISDOM TOOTH EXTRACTION      SOCIAL HISTORY: Social History   Tobacco Use  . Smoking status: Former Smoker    Packs/day: 2.00    Years: 20.00    Pack years: 40.00    Types: Cigarettes    Quit date: 03/03/1979    Years since quitting: 39.7  . Smokeless tobacco: Never Used  Substance Use Topics  . Alcohol use: Yes    Comment: rare  . Drug use: No    FAMILY HISTORY: Family History  Problem Relation Age of Onset  . Asthma Mother   . Depression Mother   . Bipolar disorder Mother   . Dementia Mother   . Arthritis Mother   . Breast cancer Mother        primary  . Colon cancer  Mother        mets from breast  . Cancer Mother        colon  . Hypertension Father   . Migraines Father   . Cancer Maternal Aunt        ?  [name] Heart disease Maternal Grandmother   . Cancer Maternal Grandfather        stomach ?    ROS: Review of Systems  Constitutional: Negative for weight loss.  Gastrointestinal: Positive for constipation and diarrhea. Negative for melena.       Negative hematochezia    PHYSICAL EXAM: Pt in no acute distress  RECENT LABS AND TESTS: BMET    Component Value Date/Time   NA 141 04/27/2018 1219   K 4.0 04/27/2018 1219   CL 104 04/27/2018 1219   CO2 22 04/27/2018 1219   GLUCOSE 78 04/27/2018 1219   GLUCOSE 116 (H) 03/05/2016 0958   BUN 13 04/27/2018 1219   CREATININE 0.67 04/27/2018 1219   CREATININE 0.81 12/28/2011 0853   CALCIUM 9.2 04/27/2018 1219   GFRNONAA 89 04/27/2018 1219   GFRAA 102 04/27/2018 1219   Lab Results  Component Value Date   HGBA1C 5.6 04/27/2018   HGBA1C 6.0 (H) 12/01/2017   HGBA1C 5.7 (H) 07/29/2017   HGBA1C 5.9 03/15/2017   HGBA1C 6.3 (H) 12/23/2016   Lab Results  Component Value Date   INSULIN 3.6 04/27/2018   INSULIN 5.6 12/01/2017   INSULIN 6.8 07/29/2017   INSULIN 20.9 12/23/2016   CBC    Component Value Date/Time   WBC 4.5 04/26/2018 1602   RBC 4.19 04/26/2018 1602   HGB 12.9 04/26/2018 1602   HGB 12.4 12/01/2017 1146   HCT 38.3 04/26/2018 1602  HCT 39.5 12/01/2017 1146   PLT 248.0 04/26/2018 1602   MCV 91.2 04/26/2018 1602   MCV 91 12/01/2017 1146   MCH 28.5 12/01/2017 1146   MCH 29.4 12/28/2011 0853   MCHC 33.7 04/26/2018 1602   RDW 16.7 (H) 04/26/2018 1602   RDW 16.2 (H) 12/01/2017 1146   LYMPHSABS 1.3 04/26/2018 1602   LYMPHSABS 1.2 12/01/2017 1146   MONOABS 0.4 04/26/2018 1602   EOSABS 0.1 04/26/2018 1602   EOSABS 0.1 12/01/2017 1146   BASOSABS 0.0 04/26/2018 1602   BASOSABS 0.0 12/01/2017 1146   Iron/TIBC/Ferritin/ %Sat    Component Value Date/Time   IRON 43 (L) 04/26/2018  1602   IRON 38 12/23/2016 0956   TIBC 249 (L) 04/26/2018 1602   TIBC 271 12/23/2016 0956   FERRITIN 60 04/26/2018 1602   FERRITIN 40 12/23/2016 0956   IRONPCTSAT 17 04/26/2018 1602   Lipid Panel     Component Value Date/Time   CHOL 207 (H) 04/27/2018 1219   TRIG 63 04/27/2018 1219   HDL 72 04/27/2018 1219   CHOLHDL 3 03/05/2016 0958   VLDL 26.0 03/05/2016 0958   LDLCALC 122 (H) 04/27/2018 1219   Hepatic Function Panel     Component Value Date/Time   PROT 7.0 04/27/2018 1219   ALBUMIN 4.0 04/27/2018 1219   AST 18 04/27/2018 1219   ALT 12 04/27/2018 1219   ALKPHOS 61 04/27/2018 1219   BILITOT <0.2 04/27/2018 1219   BILIDIR 0.1 02/15/2015 1010   IBILI 0.2 12/28/2011 0853      Component Value Date/Time   TSH 1.41 04/26/2018 1602   TSH 1.170 12/23/2016 0956   TSH 1.76 03/05/2016 0958      I, [name], am acting as transcriptionist for [name], MD I have reviewed the above documentation for accuracy and completeness, and I agree with the above. -[name], MD

## 2018-11-24 ENCOUNTER — Ambulatory Visit (INDEPENDENT_AMBULATORY_CARE_PROVIDER_SITE_OTHER): Payer: Medicare Other

## 2018-11-24 ENCOUNTER — Other Ambulatory Visit: Payer: Self-pay

## 2018-11-24 DIAGNOSIS — Z0000 Encounter for general adult medical examination without abnormal findings: Secondary | ICD-10-CM | POA: Diagnosis not present

## 2018-11-24 NOTE — Patient Instructions (Addendum)
  Heather [name] , Thank you for taking time to come for your Medicare Wellness Visit. I appreciate your ongoing commitment to your health goals. Please review the following plan we discussed and let me know if I can assist you in the future.   These are the goals we discussed: Goals    . Follow up with Provider as scheduled     A1c due        This is a list of the screening recommended for you and due dates:  Health Maintenance  Topic Date Due  . Hemoglobin A1C  10/26/2018  . Flu Shot  05/31/2019*  . Pneumonia vaccines (2 of 2 - PPSV23) 11/24/2019*  . Urine Protein Check  04/27/2019  . Eye exam for diabetics  05/02/2019  . Complete foot exam   09/11/2019  . Mammogram  06/19/2020  . Colon Cancer Screening  06/18/2021  . Tetanus Vaccine  01/31/2024  . DEXA scan (bone density measurement)  Completed  .  Hepatitis C: One time screening is recommended by Center for Disease Control  (CDC) for  adults born from [birth year] through 1964.   Completed  *Topic was postponed. The date shown is not the original due date.

## 2018-11-24 NOTE — Progress Notes (Signed)
Subjective:   Heather [name] is a 73 y.o. female who presents for Medicare Annual (Subsequent) preventive examination.  Review of Systems:  No ROS.  Medicare Wellness Virtual Visit.  Visual/audio telehealth visit, UTA vital signs.   See social history for additional risk factors.  Cardiac Risk Factors include: advanced age (>66mn, >>56women);diabetes mellitus     Objective:     Vitals: There were no vitals taken for this visit.  There is no height or weight on file to calculate BMI.  Advanced Directives 11/24/2018 03/16/2017 03/15/2017 06/18/2016 03/05/2016 02/15/2015 08/21/2014  Does Patient Have a Medical Advance Directive? No No No No No No No  Would patient like information on creating a medical advance directive? Yes (MAU/Ambulatory/Procedural Areas - Information given) No - Patient declined - - - - Yes - Educational materials given    Tobacco Social History   Tobacco Use  Smoking Status Former Smoker  . Packs/day: 2.00  . Years: 20.00  . Pack years: 40.00  . Types: Cigarettes  . Quit date: 03/03/1979  . Years since quitting: 39.7  Smokeless Tobacco Never Used     Counseling given: Not Answered   Clinical Intake:  Pre-visit preparation completed: Yes        Diabetes: Yes(Followed by pcp)  How often do you need to have someone help you when you read instructions, pamphlets, or other written materials from your doctor or pharmacy?: 1 - Never  Interpreter Needed?: No     Past Medical History:  Diagnosis Date  . Abdominal hernia   . Abnormal Pap smear   . ALLERGIC RHINITIS 10/22/2006  . Allergy   . ANEMIA 12/18/2008  . Arthritis    scoliosis moderate deg changes lumbar Xray 11/04/17   . ASYMPTOMATIC POSTMENOPAUSAL STATUS 11/22/2007  . Back pain   . CAD (coronary artery disease)    in LAD  . Complication of anesthesia   . Degenerative arthritis   . DIABETES MELLITUS, TYPE II 01/13/2007  . Dyslipidemia   . Fibroid   . Frequent headaches   . GERD  07/20/2007  . GOITER, MULTINODULAR 07/20/2007  . Headache(784.0) 07/20/2007  . HEARING LOSS 11/22/2007  . Hiatal hernia   . Hiatal hernia    large  . History of chicken pox   . Hx of colposcopy with cervical biopsy   . HYPERCHOLESTEROLEMIA 01/13/2007  . Hyperglycemia   . HYPERTENSION 10/22/2006  . Lung nodule    unchanged since 05/26/13   . Migraines   . NASH (nonalcoholic steatohepatitis)   . Nocturnal hypoxemia 02/17/2013  . Obesity   . Obstructive sleep apnea   . OSA on CPAP    per neurology  . OSTEOARTHRITIS 10/22/2006  . Other chronic nonalcoholic liver disease 55/49/8264 . Sedimentation rate elevation   . Sleep apnea    on cpap  . Urine incontinence   . UTI (urinary tract infection)    Past Surgical History:  Procedure Laterality Date  . BREAST SURGERY  1984   Breast reduction b/l   . CATARACT EXTRACTION, BILATERAL    . DEXA  08/2005  . DILATION AND CURETTAGE OF UTERUS    . ELECTROCARDIOGRAM  10/15/2006  . ESOPHAGOGASTRODUODENOSCOPY  12/08/2005  . FOOT SURGERY     hammertoe and bunion 05/2017   . Stress Cardiolite  10/21/2005  . sweat gland removal    . WISDOM TOOTH EXTRACTION     Family History  Problem Relation Age of Onset  . Asthma Mother   .  Depression Mother   . Bipolar disorder Mother   . Dementia Mother   . Arthritis Mother   . Breast cancer Mother        primary  . Colon cancer Mother        mets from breast  . Cancer Mother        colon  . Hypertension Father   . Migraines Father   . Cancer Maternal Aunt        ?  [name] Heart disease Maternal Grandmother   . Cancer Maternal Grandfather        stomach ?   Social History   Socioeconomic History  . Marital status: Married    Spouse name: [name]  . Number of children: 2  . Years of education: College  . Highest education level: Not on file  Occupational History  . Occupation: Retired  Scientific laboratory technician  . Financial resource strain: Not hard at all  . Food insecurity    Worry: Never true     Inability: Never true  . Transportation needs    Medical: No    Non-medical: No  Tobacco Use  . Smoking status: Former Smoker    Packs/day: 2.00    Years: 20.00    Pack years: 40.00    Types: Cigarettes    Quit date: 03/03/1979    Years since quitting: 39.7  . Smokeless tobacco: Never Used  Substance and Sexual Activity  . Alcohol use: Yes    Comment: rare  . Drug use: No  . Sexual activity: Yes    Birth control/protection: Surgical  Lifestyle  . Physical activity    Days per week: 4 days    Minutes per session: 60 min  . Stress: Not at all  Relationships  . Social Herbalist on phone: Not on file    Gets together: Not on file    Attends religious service: Not on file    Active member of club or organization: Not on file    Attends meetings of clubs or organizations: Not on file    Relationship status: Not on file  Other Topics Concern  . Not on file  Social History Narrative   Patient is married [name]).   Patient has two children; 3 pregnancies 2 live births    Patient is retired Designer, jewellery, Chiropodist    Patient has a Financial risk analyst.   Patient is right-handed.   Patient lives at home with family.   Caffeine Use: 2 soda every other day   Former smoker 20+ years ago 2 ppd quit in 50s   No guns, wears seat belt, safe in relationship     Outpatient Encounter Medications as of 11/24/2018  Medication Sig  . acetaminophen (TYLENOL) 500 MG tablet Take 650 mg by mouth 3 (three) times daily as needed for pain.   . Calcium Carbonate-Vitamin D (CALCIUM 600 + D PO) Take 2 tablets by mouth daily.    . clopidogrel (PLAVIX) 75 MG tablet Take 1 tablet (75 mg total) by mouth daily.  . Ferrous Sulfate (IRON) 325 (65 FE) MG TABS Take 1 tablet by mouth daily.    . fexofenadine (ALLEGRA) 180 MG tablet Take 180 mg by mouth daily.  . furosemide (LASIX) 40 MG tablet Take 1 tablet (40 mg total) by mouth daily.  [name] gabapentin  (NEURONTIN) 100 MG capsule Take 2 capsules (200 mg total) by mouth at bedtime.  [name] glucosamine-chondroitin 500-400 MG tablet Take  1 tablet by mouth 2 (two) times daily.    [name] ipratropium (ATROVENT) 0.06 % nasal spray Place 2 sprays into both nostrils 4 (four) times daily.  [name] loratadine (CLARITIN) 10 MG tablet Take 1 tablet (10 mg total) by mouth daily.  . metFORMIN (GLUCOPHAGE) 500 MG tablet Take 2 tablets (1,000 mg total) by mouth daily with breakfast.  . modafinil (PROVIGIL) 200 MG tablet modafinil 200 mg tablet  . Multiple Vitamin (MULTIVITAMIN) tablet Take 1 tablet by mouth daily.    . naproxen sodium (ANAPROX) 220 MG tablet Take 220 mg by mouth as needed.  . Omega-3 Fatty Acids (FISH OIL) 1000 MG CAPS Take 1 capsule by mouth daily.  . pantoprazole (PROTONIX) 20 MG tablet Take 1 tablet (20 mg total) by mouth daily. 30 minutes before food  . potassium chloride SA (KLOR-CON M20) 20 MEQ tablet Take 1 tablet (20 mEq total) by mouth daily.  [name] pyridOXINE (VITAMIN B-6) 100 MG tablet Take 200 mg by mouth daily.  . rosuvastatin (CRESTOR) 40 MG tablet Take 1 tablet (40 mg total) by mouth daily. At night  . SF 5000 PLUS 1.1 % CREA dental cream   . SUMAtriptan (IMITREX) 20 MG/ACT nasal spray sumatriptan 20 mg/actuation nasal spray  . topiramate (TOPAMAX) 100 MG tablet Take 1 tablet (100 mg total) by mouth 2 (two) times daily.  . Turmeric 500 MG TABS Take 2 tablets by mouth daily.  [name] venlafaxine XR (EFFEXOR XR) 37.5 MG 24 hr capsule Take 1 capsule (37.5 mg total) by mouth daily with breakfast.  . vitamin E 200 UNIT capsule Take 200 Units by mouth daily.   Facility-Administered Encounter Medications as of 11/24/2018  Medication  . 0.9 %  sodium chloride infusion    Activities of Daily Living In your present state of health, do you have any difficulty performing the following activities: 11/24/2018  Hearing? N  Vision? N  Difficulty concentrating or making decisions? N  Walking or climbing stairs? Y   Comment Injections to both knees every couple of months. Followed by Dr. Charlann Boxer  Dressing or bathing? N  Doing errands, shopping? N  Preparing Food and eating ? N  Using the Toilet? N  In the past six months, have you accidently leaked urine? N  Do you have problems with loss of bowel control? N  Managing your Medications? N  Managing your Finances? N  Housekeeping or managing your Housekeeping? Y  Comment Maid assist  Some recent data might be hidden    Patient Care Team: McLean-Scocuzza, [name], MD as PCP - General (Internal Medicine) Kathrynn Ducking, MD (Neurology) Leo Grosser, Seymour Bars, MD (Inactive) (Obstetrics and Gynecology) [place], Heather, [place] (Optometry) Brand Males, MD as Consulting Physician (Pulmonary Disease) Lahoma Rocker, MD as Referring Physician (Rheumatology) Larey Dresser, MD as Consulting Physician (Cardiology)    Assessment:   This is a routine wellness examination for Heather [name].  I connected with patient 11/24/18 at 11:30 AM EDT by an audio enabled telemedicine application and verified that I am speaking with the correct person using two identifiers. Patient stated full name and DOB. Patient gave permission to continue with virtual visit. Patient's location was at home and Nurse's location was at [place] office.   Health Maintenance Due: -Influenza vaccine 2020- discussed; to be completed in season with doctor or local pharmacy.   -PNA - discussed; to be completed with doctor in visit or local pharmacy.  -Foot Exam- followed by podiatrist. Seen every 3 months.  -Hgb A1c-  04/27/18 (5.6)  followed by pcp Update all pending maintenance due as appropriate.   See completed HM at the end of note.   Eye: Visual acuity not assessed. Virtual visit. Wears corrective lenses. Followed by their ophthalmologist.  Dental: UTD  Hearing: Demonstrates normal hearing during visit.  Safety:  Patient feels safe at home- yes Patient does have smoke detectors at  home- yes Patient does wear sunscreen or protective clothing when in direct sunlight - yes Patient does wear seat belt when in a moving vehicle - yes Patient drives- yes Adequate lighting in walkways free from debris- yes Grab bars and handrails used as appropriate- yes Ambulates with an assistive device- cane in use as needed. Rollator walker with extended walking.  Cell phone on person when ambulating outside of the home- yes  Social: Alcohol intake - yes      Smoking history- former    Smokers in home? none Illicit drug use? none  Depression: PHQ 2 &9 complete. See screening below. Denies irritability, anhedonia, sadness/tearfullness.  Stable.   Falls: See screening below.    Medication: Taking as directed and without issues.   Covid-19: Precautions and sickness symptoms discussed. Wears mask, social distancing, hand hygiene as appropriate.   Activities of Daily Living Patient denies needing assistance with: feeding themselves, getting from bed to chair, getting to the toilet, bathing/showering, dressing, managing money, or preparing meals.  Maid assist with household chores.   Memory: Patient is alert. Patient denies difficulty focusing or concentrating. Correctly identified the president of the [place], season and recall. 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  BMI- discussed the importance of a healthy diet, water intake and the benefits of aerobic exercise.  Educational material provided.  Physical activity- elliptical in use 4 times weekly, 30-60 minutes, Zumba for seniors.  Diet: healthy weight and wellness program  Water: good intake  Other Providers Patient Care Team: McLean-Scocuzza, [name], MD as PCP - General (Internal Medicine) Kathrynn Ducking, MD (Neurology) [place], Seymour Bars, MD (Inactive) (Obstetrics and Gynecology) [place], Heather, [place] (Optometry) Brand Males, MD as Consulting  Physician (Pulmonary Disease) Lahoma Rocker, MD as Referring Physician (Rheumatology) Larey Dresser, MD as Consulting Physician (Cardiology)  Exercise Activities and Dietary recommendations Current Exercise Habits: Home exercise routine, Type of exercise: calisthenics, Time (Minutes): 60, Frequency (Times/Week): 4, Weekly Exercise (Minutes/Week): 240, Intensity: Mild  Goals    . Follow up with Provider as scheduled     A1c due        Fall Risk Fall Risk  11/24/2018 04/26/2018 01/05/2018  Falls in the past year? 0 0 0   Timed Get Up and Go performed: no, virtual visit  Depression Screen PHQ 2/9 Scores 11/24/2018 01/05/2018 12/23/2016  PHQ - 2 Score 0 0 3  PHQ- 9 Score - - 10     Cognitive Function     6CIT Screen 11/24/2018  What Year? 0 points  What month? 0 points  What time? 0 points  Count back from 20 0 points  Months in reverse 0 points  Repeat phrase 0 points  Total Score 0    Immunization History  Administered Date(s) Administered  . Influenza Split 12/25/2010, 12/28/2011  . Influenza Whole 12/18/2008, 12/03/2009  . Influenza, High Dose Seasonal PF 03/05/2016, 12/29/2016, 01/05/2018  . Influenza,inj,Quad PF,6+ Mos 01/24/2013, 01/30/2014, 02/15/2015  . Pneumococcal Conjugate-13 01/30/2014  . Pneumococcal Polysaccharide-23 12/18/2008  . Tdap 01/30/2014  . Zoster 01/30/2014  Screening Tests Health Maintenance  Topic Date Due  . HEMOGLOBIN A1C  10/26/2018  . INFLUENZA VACCINE  05/31/2019 (Originally 10/01/2018)  . PNA vac Low Risk Adult (2 of 2 - PPSV23) 11/24/2019 (Originally 01/31/2015)  . URINE MICROALBUMIN  04/27/2019  . OPHTHALMOLOGY EXAM  05/02/2019  . FOOT EXAM  09/11/2019  . MAMMOGRAM  06/19/2020  . COLONOSCOPY  06/18/2021  . TETANUS/TDAP  01/31/2024  . DEXA SCAN  Completed  . Hepatitis C Screening  Completed      Plan:   Keep all routine maintenance appointments.   Follow up @ 2:00. Flu vaccine, second pneumovax 23 (booster) and A1C due   Medicare Attestation I have personally reviewed: The patient's medical and social history Their use of alcohol, tobacco or illicit drugs Their current medications and supplements The patient's functional ability including ADLs,fall risks, home safety risks, cognitive, and hearing and visual impairment Diet and physical activities Evidence for depression   In addition, I have reviewed and discussed with patient certain preventive protocols, quality metrics, and best practice recommendations. A written personalized care plan for preventive services as well as general preventive health recommendations were provided to patient via mail.     Varney Biles, LPN  [id number]

## 2018-11-30 ENCOUNTER — Encounter (INDEPENDENT_AMBULATORY_CARE_PROVIDER_SITE_OTHER): Payer: Self-pay | Admitting: Family Medicine

## 2018-11-30 ENCOUNTER — Other Ambulatory Visit: Payer: Self-pay

## 2018-11-30 ENCOUNTER — Telehealth (INDEPENDENT_AMBULATORY_CARE_PROVIDER_SITE_OTHER): Payer: Medicare Other | Admitting: Family Medicine

## 2018-11-30 ENCOUNTER — Encounter (INDEPENDENT_AMBULATORY_CARE_PROVIDER_SITE_OTHER): Payer: Self-pay

## 2018-11-30 DIAGNOSIS — E559 Vitamin D deficiency, unspecified: Secondary | ICD-10-CM

## 2018-11-30 DIAGNOSIS — E7849 Other hyperlipidemia: Secondary | ICD-10-CM | POA: Diagnosis not present

## 2018-11-30 DIAGNOSIS — E669 Obesity, unspecified: Secondary | ICD-10-CM

## 2018-11-30 DIAGNOSIS — Z6832 Body mass index (BMI) 32.0-32.9, adult: Secondary | ICD-10-CM | POA: Diagnosis not present

## 2018-11-30 DIAGNOSIS — E119 Type 2 diabetes mellitus without complications: Secondary | ICD-10-CM

## 2018-12-05 DIAGNOSIS — E119 Type 2 diabetes mellitus without complications: Secondary | ICD-10-CM | POA: Diagnosis not present

## 2018-12-05 DIAGNOSIS — E7849 Other hyperlipidemia: Secondary | ICD-10-CM | POA: Diagnosis not present

## 2018-12-05 DIAGNOSIS — E559 Vitamin D deficiency, unspecified: Secondary | ICD-10-CM | POA: Diagnosis not present

## 2018-12-05 NOTE — Progress Notes (Signed)
Office: [phone]  /  Fax: [phone] TeleHealth Visit:  Heather [name] has verbally consented to this TeleHealth visit today. The patient is located at home, the provider is located at the [facility] office. The participants in this visit include the listed provider, patient and spouse [name]), and any and all parties involved. The visit was conducted today via telephone call. Patrice was unable to use realtime audiovisual technology today and the telehealth visit was conducted via telephone.  HPI:   Chief Complaint: OBESITY Heather [name] is here to discuss her progress with her obesity treatment plan. She is on the lower carbohydrate, vegetable and lean protein rich diet plan and is following her eating plan approximately 85 % of the time. She states she is exercising with the elliptical 30 to 60 minutes 4 times per week, and she is adding Zumba for short periods of time. Heather [name] has been struggling with weight gain and she has gained 2 pounds. She has had some increased celebration eating for her husbands birthday. We were unable to weigh the patient today for this TeleHealth visit. She feels as if she has gained weight since her last visit (weight not reported). She has lost 73 lbs since starting treatment with [name].  Diabetes II Heather [name] has a diagnosis of diabetes type II. Heather [name] is not checking her blood sugars at home, but she denies nausea, vomiting or hypoglycemia. Last A1c was at 5.6 She has been working on intensive lifestyle modifications including diet, exercise, and weight loss to help control her blood glucose levels. Heather [name] is due for labs.  Vitamin D deficiency Heather [name] has a diagnosis of vitamin D deficiency. Heather [name] is currently taking OTC calcium with vit D and she denies nausea, vomiting or muscle weakness. Heather [name] is due for labs.  Hyperlipidemia Heather [name] has hyperlipidemia and she is on statin. She has been working on diet. Heather [name] has been trying to improve her cholesterol  levels with intensive lifestyle modification including a low saturated fat diet, exercise and weight loss. She denies any chest pain or myalgias.  ASSESSMENT AND PLAN:  Type 2 diabetes mellitus without complication, without long-term current use of insulin (HCC) - Plan: Comprehensive metabolic panel, Hemoglobin A1c, Insulin, random  Other hyperlipidemia - Plan: Lipid Panel With LDL/HDL Ratio  Vitamin D deficiency - Plan: VITAMIN D 25 Hydroxy (Vit-D Deficiency, Fractures)  Class 1 obesity with serious comorbidity and body mass index (BMI) of 32.0 to 32.9 in adult, unspecified obesity type  PLAN:  Diabetes II Heather [name] has been given extensive diabetes education by myself today including ideal fasting and post-prandial blood glucose readings, individual ideal Hgb A1c goals and hypoglycemia prevention. We discussed the importance of good blood sugar control to decrease the likelihood of diabetic complications such as nephropathy, neuropathy, limb loss, blindness, coronary artery disease, and death. We discussed the importance of intensive lifestyle modification including diet, exercise and weight loss as the first line treatment for diabetes. We will check labs. Heather [name] agrees to check her blood sugar twice daily and continue diet prescription. Heather [name] agrees to follow up as directed.  Vitamin D Deficiency Heather [name] was informed that low vitamin D levels contributes to fatigue and are associated with obesity, breast, and colon cancer. Heather [name] will continue OTC calcium with vitamin D and she will follow up for routine testing of vitamin D, at least 2-3 times per year. She was informed of the risk of over-replacement of vitamin D and agrees to not increase her dose unless she discusses this with  [name] first.  Hyperlipidemia Heather [name] was informed of the American Heart Association Guidelines emphasizing intensive lifestyle modifications as the first line treatment for hyperlipidemia. We discussed many lifestyle  modifications today in depth, and Heather [name] will continue to work on decreasing saturated fats such as fatty red meat, butter and many fried foods. She will also increase vegetables and lean protein in her diet and continue to work on exercise and weight loss efforts. We will check labs and [name] will follow up at the agreed upon time.  Obesity Heather [name] is currently in the action stage of change. As such, her goal is to continue with weight loss efforts She has agreed to follow a lower carbohydrate, vegetable and lean protein rich diet plan Heather [name] has been instructed to work up to a goal of 150 minutes of combined cardio and strengthening exercise per week for weight loss and overall health benefits. We discussed the following Behavioral Modification Strategies today: increasing lean protein intake, decreasing simple carbohydrates  and work on meal planning and easy cooking plans  I spent > than 50% of the 25 minute visit on counseling as documented in the note.   Heather [name] has agreed to follow up with our clinic in 3 weeks. She was informed of the importance of frequent follow up visits to maximize her success with intensive lifestyle modifications for her multiple health conditions.  ALLERGIES: Allergies  Allergen Reactions   Aspirin    Coconut Oil    Lisinopril     REACTION: cough   Metoprolol Itching   Peanut-Containing Drug Products    Penicillins     REACTION: rash   Prednisone    Strawberry Extract     MEDICATIONS: Current Outpatient Medications on File Prior to Visit  Medication Sig Dispense Refill   acetaminophen (TYLENOL) 500 MG tablet Take 650 mg by mouth 3 (three) times daily as needed for pain.      Calcium Carbonate-Vitamin D (CALCIUM 600 + D PO) Take 2 tablets by mouth daily.       clopidogrel (PLAVIX) 75 MG tablet Take 1 tablet (75 mg total) by mouth daily. 90 tablet 3   Ferrous Sulfate (IRON) 325 (65 FE) MG TABS Take 1 tablet by mouth daily.       fexofenadine  (ALLEGRA) 180 MG tablet Take 180 mg by mouth daily.     furosemide (LASIX) 40 MG tablet Take 1 tablet (40 mg total) by mouth daily. 90 tablet 3   gabapentin (NEURONTIN) 100 MG capsule Take 2 capsules (200 mg total) by mouth at bedtime. 180 capsule 3   glucosamine-chondroitin 500-400 MG tablet Take 1 tablet by mouth 2 (two) times daily.       ipratropium (ATROVENT) 0.06 % nasal spray Place 2 sprays into both nostrils 4 (four) times daily. 15 mL 12   loratadine (CLARITIN) 10 MG tablet Take 1 tablet (10 mg total) by mouth daily. 30 tablet 11   metFORMIN (GLUCOPHAGE) 500 MG tablet Take 2 tablets (1,000 mg total) by mouth daily with breakfast. 180 tablet 3   modafinil (PROVIGIL) 200 MG tablet modafinil 200 mg tablet     Multiple Vitamin (MULTIVITAMIN) tablet Take 1 tablet by mouth daily.       naproxen sodium (ANAPROX) 220 MG tablet Take 220 mg by mouth as needed.     Omega-3 Fatty Acids (FISH OIL) 1000 MG CAPS Take 1 capsule by mouth daily.     pantoprazole (PROTONIX) 20 MG tablet Take 1 tablet (20 mg total) by mouth daily.  30 minutes before food 30 tablet 11   potassium chloride SA (KLOR-CON M20) 20 MEQ tablet Take 1 tablet (20 mEq total) by mouth daily. 90 tablet 3   pyridOXINE (VITAMIN B-6) 100 MG tablet Take 200 mg by mouth daily.     rosuvastatin (CRESTOR) 40 MG tablet Take 1 tablet (40 mg total) by mouth daily. At night 90 tablet 3   SF 5000 PLUS 1.1 % CREA dental cream   0   SUMAtriptan (IMITREX) 20 MG/ACT nasal spray sumatriptan 20 mg/actuation nasal spray     topiramate (TOPAMAX) 100 MG tablet Take 1 tablet (100 mg total) by mouth 2 (two) times daily. 180 tablet 3   Turmeric 500 MG TABS Take 2 tablets by mouth daily.     venlafaxine XR (EFFEXOR XR) 37.5 MG 24 hr capsule Take 1 capsule (37.5 mg total) by mouth daily with breakfast. 90 capsule 1   vitamin E 200 UNIT capsule Take 200 Units by mouth daily.     Current Facility-Administered Medications on File Prior to Visit   Medication Dose Route Frequency Provider Last Rate Last Dose   0.9 %  sodium chloride infusion  500 mL Intravenous Continuous Nandigam, Venia Minks, MD        PAST MEDICAL HISTORY: Past Medical History:  Diagnosis Date   Abdominal hernia    Abnormal Pap smear    ALLERGIC RHINITIS 10/22/2006   Allergy    ANEMIA 12/18/2008   Arthritis    scoliosis moderate deg changes lumbar Xray 11/04/17    ASYMPTOMATIC POSTMENOPAUSAL STATUS 11/22/2007   Back pain    CAD (coronary artery disease)    in LAD   Complication of anesthesia    Degenerative arthritis    DIABETES MELLITUS, TYPE II 01/13/2007   Dyslipidemia    Fibroid    Frequent headaches    GERD 07/20/2007   GOITER, MULTINODULAR 07/20/2007   Headache(784.0) 07/20/2007   HEARING LOSS 11/22/2007   Hiatal hernia    Hiatal hernia    large   History of chicken pox    Hx of colposcopy with cervical biopsy    HYPERCHOLESTEROLEMIA 01/13/2007   Hyperglycemia    HYPERTENSION 10/22/2006   Lung nodule    unchanged since 05/26/13    Migraines    NASH (nonalcoholic steatohepatitis)    Nocturnal hypoxemia 02/17/2013   Obesity    Obstructive sleep apnea    OSA on CPAP    per neurology   OSTEOARTHRITIS 10/22/2006   Other chronic nonalcoholic liver disease 4/31/5400   Sedimentation rate elevation    Sleep apnea    on cpap   Urine incontinence    UTI (urinary tract infection)     PAST SURGICAL HISTORY: Past Surgical History:  Procedure Laterality Date   BREAST SURGERY  1984   Breast reduction b/l    CATARACT EXTRACTION, BILATERAL     DEXA  08/2005   DILATION AND CURETTAGE OF UTERUS     ELECTROCARDIOGRAM  10/15/2006   ESOPHAGOGASTRODUODENOSCOPY  12/08/2005   FOOT SURGERY     hammertoe and bunion 05/2017    Stress Cardiolite  10/21/2005   sweat gland removal     WISDOM TOOTH EXTRACTION      SOCIAL HISTORY: Social History   Tobacco Use   Smoking status: Former Smoker    Packs/day:  2.00    Years: 20.00    Pack years: 40.00    Types: Cigarettes    Quit date: 03/03/1979    Years since quitting: 39.7  Smokeless tobacco: Never Used  Substance Use Topics   Alcohol use: Yes    Comment: rare   Drug use: No    FAMILY HISTORY: Family History  Problem Relation Age of Onset   Asthma Mother    Depression Mother    Bipolar disorder Mother    Dementia Mother    Arthritis Mother    Breast cancer Mother        primary   Colon cancer Mother        mets from breast   Cancer Mother        colon   Hypertension Father    Migraines Father    Cancer Maternal Aunt        ?   Heart disease Maternal Grandmother    Cancer Maternal Grandfather        stomach ?    ROS: Review of Systems  Constitutional: Negative for weight loss.  Cardiovascular: Negative for chest pain.  Gastrointestinal: Negative for nausea and vomiting.  Musculoskeletal: Negative for myalgias.       Negative for muscle weakness  Endo/Heme/Allergies:       Negative for hypoglycemia    PHYSICAL EXAM: Pt in no acute distress  RECENT LABS AND TESTS: BMET    Component Value Date/Time   NA 141 04/27/2018 1219   K 4.0 04/27/2018 1219   CL 104 04/27/2018 1219   CO2 22 04/27/2018 1219   GLUCOSE 78 04/27/2018 1219   GLUCOSE 116 (H) 03/05/2016 0958   BUN 13 04/27/2018 1219   CREATININE 0.67 04/27/2018 1219   CREATININE 0.81 12/28/2011 0853   CALCIUM 9.2 04/27/2018 1219   GFRNONAA 89 04/27/2018 1219   GFRAA 102 04/27/2018 1219   Lab Results  Component Value Date   HGBA1C 5.6 04/27/2018   HGBA1C 6.0 (H) 12/01/2017   HGBA1C 5.7 (H) 07/29/2017   HGBA1C 5.9 03/15/2017   HGBA1C 6.3 (H) 12/23/2016   Lab Results  Component Value Date   INSULIN 3.6 04/27/2018   INSULIN 5.6 12/01/2017   INSULIN 6.8 07/29/2017   INSULIN 20.9 12/23/2016   CBC    Component Value Date/Time   WBC 4.5 04/26/2018 1602   RBC 4.19 04/26/2018 1602   HGB 12.9 04/26/2018 1602   HGB 12.4 12/01/2017  1146   HCT 38.3 04/26/2018 1602   HCT 39.5 12/01/2017 1146   PLT 248.0 04/26/2018 1602   MCV 91.2 04/26/2018 1602   MCV 91 12/01/2017 1146   MCH 28.5 12/01/2017 1146   MCH 29.4 12/28/2011 0853   MCHC 33.7 04/26/2018 1602   RDW 16.7 (H) 04/26/2018 1602   RDW 16.2 (H) 12/01/2017 1146   LYMPHSABS 1.3 04/26/2018 1602   LYMPHSABS 1.2 12/01/2017 1146   MONOABS 0.4 04/26/2018 1602   EOSABS 0.1 04/26/2018 1602   EOSABS 0.1 12/01/2017 1146   BASOSABS 0.0 04/26/2018 1602   BASOSABS 0.0 12/01/2017 1146   Iron/TIBC/Ferritin/ %Sat    Component Value Date/Time   IRON 43 (L) 04/26/2018 1602   IRON 38 12/23/2016 0956   TIBC 249 (L) 04/26/2018 1602   TIBC 271 12/23/2016 0956   FERRITIN 60 04/26/2018 1602   FERRITIN 40 12/23/2016 0956   IRONPCTSAT 17 04/26/2018 1602   Lipid Panel     Component Value Date/Time   CHOL 207 (H) 04/27/2018 1219   TRIG 63 04/27/2018 1219   HDL 72 04/27/2018 1219   CHOLHDL 3 03/05/2016 0958   VLDL 26.0 03/05/2016 0958   LDLCALC 122 (H) 04/27/2018 1219  Hepatic Function Panel     Component Value Date/Time   PROT 7.0 04/27/2018 1219   ALBUMIN 4.0 04/27/2018 1219   AST 18 04/27/2018 1219   ALT 12 04/27/2018 1219   ALKPHOS 61 04/27/2018 1219   BILITOT <0.2 04/27/2018 1219   BILIDIR 0.1 02/15/2015 1010   IBILI 0.2 12/28/2011 0853      Component Value Date/Time   TSH 1.41 04/26/2018 1602   TSH 1.170 12/23/2016 0956   TSH 1.76 03/05/2016 0958     Ref. Range 04/27/2018 12:19  Vitamin D, 25-Hydroxy Latest Ref Range: 30.0 - 100.0 ng/mL 36.9    I, Doreene Nest, am acting as Location manager for [name], MD I have reviewed the above documentation for accuracy and completeness, and I agree with the above. -[name], MD

## 2018-12-06 LAB — LIPID PANEL WITH LDL/HDL RATIO
Cholesterol, Total: 178 mg/dL (ref 100–199)
HDL: 77 mg/dL (ref >39)
LDL Chol Calc (NIH): 89 mg/dL (ref 0–99)
LDL/HDL Ratio: 1.2 ratio (ref 0.0–3.2)
Triglycerides: 65 mg/dL (ref 0–149)
VLDL Cholesterol Cal: 12 mg/dL (ref 5–40)

## 2018-12-06 LAB — COMPREHENSIVE METABOLIC PANEL
ALT: 14 IU/L (ref 0–32)
AST: 19 IU/L (ref 0–40)
Albumin/Globulin Ratio: 1.4 (ref 1.2–2.2)
Albumin: 3.9 g/dL (ref 3.7–4.7)
Alkaline Phosphatase: 65 IU/L (ref 39–117)
BUN/Creatinine Ratio: 26 (ref 12–28)
BUN: 22 mg/dL (ref 8–27)
Bilirubin Total: <0.2 mg/dL (ref 0.0–1.2)
CO2: 25 mmol/L (ref 20–29)
Calcium: 9.5 mg/dL (ref 8.7–10.3)
Chloride: 106 mmol/L (ref 96–106)
Creatinine, Ser: 0.84 mg/dL (ref 0.57–1.00)
GFR calc Af Amer: 80 mL/min/{1.73_m2} (ref >59)
GFR calc non Af Amer: 70 mL/min/{1.73_m2} (ref >59)
Globulin, Total: 2.7 g/dL (ref 1.5–4.5)
Glucose: 85 mg/dL (ref 65–99)
Potassium: 4.1 mmol/L (ref 3.5–5.2)
Sodium: 141 mmol/L (ref 134–144)
Total Protein: 6.6 g/dL (ref 6.0–8.5)

## 2018-12-06 LAB — VITAMIN D 25 HYDROXY (VIT D DEFICIENCY, FRACTURES): Vit D, 25-Hydroxy: 35.1 ng/mL (ref 30.0–100.0)

## 2018-12-06 LAB — HEMOGLOBIN A1C
Est. average glucose Bld gHb Est-mCnc: 114 mg/dL
Hgb A1c MFr Bld: 5.6 % (ref 4.8–5.6)

## 2018-12-06 LAB — INSULIN, RANDOM: INSULIN: 6.4 u[IU]/mL (ref 2.6–24.9)

## 2018-12-13 ENCOUNTER — Other Ambulatory Visit: Payer: Self-pay

## 2018-12-13 ENCOUNTER — Ambulatory Visit (INDEPENDENT_AMBULATORY_CARE_PROVIDER_SITE_OTHER): Payer: Medicare Other

## 2018-12-13 DIAGNOSIS — Z23 Encounter for immunization: Secondary | ICD-10-CM | POA: Diagnosis not present

## 2018-12-19 NOTE — Progress Notes (Signed)
Reviewed and agree with documentation and assessment and plan. K. Veena Julieanne Hadsall , MD   

## 2018-12-20 ENCOUNTER — Ambulatory Visit (INDEPENDENT_AMBULATORY_CARE_PROVIDER_SITE_OTHER): Payer: Medicare Other | Admitting: Family Medicine

## 2018-12-20 ENCOUNTER — Encounter: Payer: Self-pay | Admitting: Family Medicine

## 2018-12-20 ENCOUNTER — Other Ambulatory Visit: Payer: Self-pay

## 2018-12-20 DIAGNOSIS — M170 Bilateral primary osteoarthritis of knee: Secondary | ICD-10-CM

## 2018-12-20 NOTE — Patient Instructions (Signed)
See me again in 8 weeks

## 2018-12-20 NOTE — Progress Notes (Signed)
[name] [address] [place], [place] [address] Phone: [phone] Subjective:   Heather [name], am serving as a scribe for Dr. Hulan Saas.   CC: bilateral knee pain   HER:[id number]   10/19/2018 Patient continues to make progress with her weight loss.  I do believe that that is more beneficial than anything else.  We discussed icing regimen and home exercises, which activities to do which wants to avoid.  Patient is to follow-up with me again in 10 weeks otherwise.  Could do viscosupplementation if necessary  Update 12/20/2018 Heather [name] is a 73 y.o. female coming in with complaint of bilateral knee pain. Injections given last visit 10/19/2018. Patient states that pain has been increasing recently. Does have relief from injections. Was doing a move from [place] classes that was beyond her ability she states and her left knee was painful.   Has been using a recumbent bike at home 3x a week.        Past Medical History:  Diagnosis Date  . Abdominal hernia   . Abnormal Pap smear   . ALLERGIC RHINITIS 10/22/2006  . Allergy   . ANEMIA 12/18/2008  . Arthritis    scoliosis moderate deg changes lumbar Xray 11/04/17   . ASYMPTOMATIC POSTMENOPAUSAL STATUS 11/22/2007  . Back pain   . CAD (coronary artery disease)    in LAD  . Complication of anesthesia   . Degenerative arthritis   . DIABETES MELLITUS, TYPE II 01/13/2007  . Dyslipidemia   . Fibroid   . Frequent headaches   . GERD 07/20/2007  . GOITER, MULTINODULAR 07/20/2007  . Headache(784.0) 07/20/2007  . HEARING LOSS 11/22/2007  . Hiatal hernia   . Hiatal hernia    large  . History of chicken pox   . Hx of colposcopy with cervical biopsy   . HYPERCHOLESTEROLEMIA 01/13/2007  . Hyperglycemia   . HYPERTENSION 10/22/2006  . Lung nodule    unchanged since 05/26/13   . Migraines   . NASH (nonalcoholic steatohepatitis)   . Nocturnal hypoxemia 02/17/2013  . Obesity   . Obstructive sleep apnea    . OSA on CPAP    per neurology  . OSTEOARTHRITIS 10/22/2006  . Other chronic nonalcoholic liver disease 5/63/1497  . Sedimentation rate elevation   . Sleep apnea    on cpap  . Urine incontinence   . UTI (urinary tract infection)    Past Surgical History:  Procedure Laterality Date  . BREAST SURGERY  1984   Breast reduction b/l   . CATARACT EXTRACTION, BILATERAL    . DEXA  08/2005  . DILATION AND CURETTAGE OF UTERUS    . ELECTROCARDIOGRAM  10/15/2006  . ESOPHAGOGASTRODUODENOSCOPY  12/08/2005  . FOOT SURGERY     hammertoe and bunion 05/2017   . Stress Cardiolite  10/21/2005  . sweat gland removal    . WISDOM TOOTH EXTRACTION     Social History   Socioeconomic History  . Marital status: Married    Spouse name: Heather [name]  . Number of children: 2  . Years of education: College  . Highest education level: Not on file  Occupational History  . Occupation: Retired  Scientific laboratory technician  . Financial resource strain: Not hard at all  . Food insecurity    Worry: Never true    Inability: Never true  . Transportation needs    Medical: [name]    Non-medical: [name]  Tobacco Use  . Smoking status: Former Smoker  Packs/day: 2.00    Years: 20.00    Pack years: 40.00    Types: Cigarettes    Quit date: 03/03/1979    Years since quitting: 39.8  . Smokeless tobacco: Never Used  Substance and Sexual Activity  . Alcohol use: Yes    Comment: rare  . Drug use: [name]  . Sexual activity: Yes    Birth control/protection: Surgical  Lifestyle  . Physical activity    Days per week: 4 days    Minutes per session: 60 min  . Stress: Not at all  Relationships  . Social Herbalist on phone: Not on file    Gets together: Not on file    Attends religious service: Not on file    Active member of club or organization: Not on file    Attends meetings of clubs or organizations: Not on file    Relationship status: Not on file  Other Topics Concern  . Not on file  Social History Narrative   Patient  is married Heather [name]).   Patient has two children; 3 pregnancies 2 live births    Patient is retired Designer, jewellery, Chiropodist    Patient has a Financial risk analyst.   Patient is right-handed.   Patient lives at home with family.   Caffeine Use: 2 soda every other day   Former smoker 20+ years ago 2 ppd quit in 50s   [name] guns, wears seat belt, safe in relationship    Allergies  Allergen Reactions  . Aspirin   . Coconut Oil   . Lisinopril     REACTION: cough  . Metoprolol Itching  . Peanut-Containing Drug Products   . Penicillins     REACTION: rash  . Prednisone   . Strawberry Extract    Family History  Problem Relation Age of Onset  . Asthma Mother   . Depression Mother   . Bipolar disorder Mother   . Dementia Mother   . Arthritis Mother   . Breast cancer Mother        primary  . Colon cancer Mother        mets from breast  . Cancer Mother        colon  . Hypertension Father   . Migraines Father   . Cancer Maternal Aunt        ?  [name] Heart disease Maternal Grandmother   . Cancer Maternal Grandfather        stomach ?    Current Outpatient Medications (Endocrine & Metabolic):  .  metFORMIN (GLUCOPHAGE) 500 MG tablet, Take 2 tablets (1,000 mg total) by mouth daily with breakfast.   Current Outpatient Medications (Cardiovascular):  .  furosemide (LASIX) 40 MG tablet, Take 1 tablet (40 mg total) by mouth daily. .  rosuvastatin (CRESTOR) 40 MG tablet, Take 1 tablet (40 mg total) by mouth daily. At night   Current Outpatient Medications (Respiratory):  .  fexofenadine (ALLEGRA) 180 MG tablet, Take 180 mg by mouth daily. [name]  ipratropium (ATROVENT) 0.06 % nasal spray, Place 2 sprays into both nostrils 4 (four) times daily. [name]  loratadine (CLARITIN) 10 MG tablet, Take 1 tablet (10 mg total) by mouth daily.   Current Outpatient Medications (Analgesics):  .  acetaminophen (TYLENOL) 500 MG tablet, Take 650 mg by mouth 3 (three) times  daily as needed for pain.  .  naproxen sodium (ANAPROX) 220 MG tablet, Take 220 mg by mouth as needed. .  SUMAtriptan (  IMITREX) 20 MG/ACT nasal spray, sumatriptan 20 mg/actuation nasal spray   Current Outpatient Medications (Hematological):  .  clopidogrel (PLAVIX) 75 MG tablet, Take 1 tablet (75 mg total) by mouth daily. .  Ferrous Sulfate (IRON) 325 (65 FE) MG TABS, Take 1 tablet by mouth daily.     Current Outpatient Medications (Other):  [name]  Calcium Carbonate-Vitamin D (CALCIUM 600 + D PO), Take 2 tablets by mouth daily.   [name]  gabapentin (NEURONTIN) 100 MG capsule, Take 2 capsules (200 mg total) by mouth at bedtime. [name]  glucosamine-chondroitin 500-400 MG tablet, Take 1 tablet by mouth 2 (two) times daily.   .  modafinil (PROVIGIL) 200 MG tablet, modafinil 200 mg tablet .  Multiple Vitamin (MULTIVITAMIN) tablet, Take 1 tablet by mouth daily.   .  Omega-3 Fatty Acids (FISH OIL) 1000 MG CAPS, Take 1 capsule by mouth daily. .  pantoprazole (PROTONIX) 20 MG tablet, Take 1 tablet (20 mg total) by mouth daily. 30 minutes before food .  potassium chloride SA (KLOR-CON M20) 20 MEQ tablet, Take 1 tablet (20 mEq total) by mouth daily. [name]  pyridOXINE (VITAMIN B-6) 100 MG tablet, Take 200 mg by mouth daily. .  SF 5000 PLUS 1.1 % CREA dental cream,  .  topiramate (TOPAMAX) 100 MG tablet, Take 1 tablet (100 mg total) by mouth 2 (two) times daily. .  Turmeric 500 MG TABS, Take 2 tablets by mouth daily. [name]  venlafaxine XR (EFFEXOR XR) 37.5 MG 24 hr capsule, Take 1 capsule (37.5 mg total) by mouth daily with breakfast. .  vitamin E 200 UNIT capsule, Take 200 Units by mouth daily.  Current Facility-Administered Medications (Other):  .  0.9 %  sodium chloride infusion    Past medical history, social, surgical and family history all reviewed in electronic medical record.  [name] pertanent information unless stated regarding to the chief complaint.   Review of Systems:  [name] headache, visual changes, nausea,  vomiting, diarrhea, constipation, dizziness, abdominal pain, skin rash, fevers, chills, night sweats, weight loss, swollen lymph nodes, body aches, joint swelling,  chest pain, shortness of breath, mood changes.   Objective  Blood pressure 132/80, height 5' 5"  (1.651 m), weight 206 lb (93.4 kg).    General: [name] apparent distress alert and oriented x3 mood and affect normal, dressed appropriately.  HEENT: Pupils equal, extraocular movements intact  Respiratory: Patient's speak in full sentences and does not appear short of breath  Cardiovascular: Trace lower extremity edema, non tender, [name] erythema  Skin: Warm dry intact with [name] signs of infection or rash on extremities or on axial skeleton.  Abdomen: Soft nontender  Neuro: Cranial nerves II through XII are intact, neurovascularly intact in all extremities with 2+ DTRs and 2+ pulses.  Lymph: [name] lymphadenopathy of posterior or anterior cervical chain or axillae bilaterally.  Gait antalgic MSK:  tender with limited range of motion and stability and symmetric strength and tone of shoulders, elbows, wrist, hip, and ankles bilaterally.  Arthritic changes  Knee: Bilateral valgus deformity noted. Large thigh to calf ratio.  Tender to palpation over medial and PF joint line.  ROM full in flexion and extension and lower leg rotation. instability with valgus force.  painful patellar compression. Patellar glide with moderate crepitus. Patellar and quadriceps tendons unremarkable. Hamstring and quadriceps strength is normal.  After informed written and verbal consent, patient was seated on exam table. Right knee was prepped with alcohol swab and utilizing anterolateral approach, patient's right knee space was injected with  4:1  marcaine 0.5%: Kenalog 38m/dL. Patient tolerated the procedure well without immediate complications.  After informed written and verbal consent, patient was seated on exam table. Left knee was prepped with alcohol swab and  utilizing anterolateral approach, patient's left knee space was injected with 4:1  marcaine 0.5%: Kenalog 446mdL. Patient tolerated the procedure well without immediate complications.    Impression and Recommendations:     This case required medical decision making of moderate complexity. The above documentation has been reviewed and is accurate and complete ZaLyndal PulleyDO       Note: This dictation was prepared with Dragon dictation along with smaller phrase technology. Any transcriptional errors that result from this process are unintentional.

## 2018-12-20 NOTE — Assessment & Plan Note (Signed)
Repeat injection given today.  Tolerated the procedure well.  Discussed icing regimen and home exercise, what activities to do which wants to avoid.  Patient is to increase activity as tolerated.  Follow-up again in 4 to 8 weeks.

## 2018-12-21 ENCOUNTER — Encounter (INDEPENDENT_AMBULATORY_CARE_PROVIDER_SITE_OTHER): Payer: Self-pay | Admitting: Family Medicine

## 2018-12-21 ENCOUNTER — Other Ambulatory Visit: Payer: Self-pay

## 2018-12-21 ENCOUNTER — Telehealth (INDEPENDENT_AMBULATORY_CARE_PROVIDER_SITE_OTHER): Payer: Medicare Other | Admitting: Family Medicine

## 2018-12-21 DIAGNOSIS — R609 Edema, unspecified: Secondary | ICD-10-CM | POA: Diagnosis not present

## 2018-12-21 DIAGNOSIS — E669 Obesity, unspecified: Secondary | ICD-10-CM

## 2018-12-21 DIAGNOSIS — Z6832 Body mass index (BMI) 32.0-32.9, adult: Secondary | ICD-10-CM

## 2018-12-22 NOTE — Progress Notes (Signed)
Office: [phone]  /  Fax: [phone] TeleHealth Visit:  Heather [name] has verbally consented to this TeleHealth visit today. The patient is located at home, the provider is located at the [facility] office. The participants in this visit include the listed provider and patient. Heather [name] was unable to use realtime audiovisual technology today and the telehealth visit was conducted via telephone.   HPI:   Chief Complaint: OBESITY Heather [name] is here to discuss her progress with her obesity treatment plan. She is on the lower carbohydrate, vegetable and lean protein rich diet plan and is following her eating plan approximately 90 % of the time. She states she is on the elliptical for 30 minutes 3 times per week. Heather [name] feels she is doing well with journaling. She thinks she has maintained her weight, which she is happy about. She is using the elliptical for 30 minutes 3 times per week.  We were unable to weigh the patient today for this TeleHealth visit. She feels as if she has maintained her weight since her last visit. She has lost 73 lbs since starting treatment with [name].  Edema Heather [name] had forgotten to take her diuretics some days and notes she is retaining some fluid, about 3 lbs.  ASSESSMENT AND PLAN:  Edema, unspecified type  Class 1 obesity with serious comorbidity and body mass index (BMI) of 32.0 to 32.9 in adult, unspecified obesity type  PLAN:  Edema We will discuss ways to do better with taking her medications, and decrease Na+. We will continue to monitor.   I spent > than 50% of the 25 minute visit on counseling as documented in the note.  Obesity Heather [name] is currently in the action stage of change. As such, her goal is to continue with weight loss efforts She has agreed to keep a food journal with 1200 calories and 75+ grams of protein daily Heather [name] has been instructed to work up to a goal of 150 minutes of combined cardio and strengthening exercise per week  for weight loss and overall health benefits. We discussed the following Behavioral Modification Strategies today: increase H20 intake   Heather [name] has agreed to follow up with our clinic in 3 weeks. She was informed of the importance of frequent follow up visits to maximize her success with intensive lifestyle modifications for her multiple health conditions.  ALLERGIES: Allergies  Allergen Reactions  . Aspirin   . Coconut Oil   . Lisinopril     REACTION: cough  . Metoprolol Itching  . Peanut-Containing Drug Products   . Penicillins     REACTION: rash  . Prednisone   . Strawberry Extract     MEDICATIONS: Current Outpatient Medications on File Prior to Visit  Medication Sig Dispense Refill  . acetaminophen (TYLENOL) 500 MG tablet Take 650 mg by mouth 3 (three) times daily as needed for pain.     . Calcium Carbonate-Vitamin D (CALCIUM 600 + D PO) Take 2 tablets by mouth daily.      . clopidogrel (PLAVIX) 75 MG tablet Take 1 tablet (75 mg total) by mouth daily. 90 tablet 3  . Ferrous Sulfate (IRON) 325 (65 FE) MG TABS Take 1 tablet by mouth daily.      . fexofenadine (ALLEGRA) 180 MG tablet Take 180 mg by mouth daily.    . furosemide (LASIX) 40 MG tablet Take 1 tablet (40 mg total) by mouth daily. 90 tablet 3  . gabapentin (NEURONTIN) 100 MG capsule Take 2 capsules (200 mg  total) by mouth at bedtime. 180 capsule 3  . glucosamine-chondroitin 500-400 MG tablet Take 1 tablet by mouth 2 (two) times daily.      [name] ipratropium (ATROVENT) 0.06 % nasal spray Place 2 sprays into both nostrils 4 (four) times daily. 15 mL 12  . loratadine (CLARITIN) 10 MG tablet Take 1 tablet (10 mg total) by mouth daily. 30 tablet 11  . metFORMIN (GLUCOPHAGE) 500 MG tablet Take 2 tablets (1,000 mg total) by mouth daily with breakfast. 180 tablet 3  . modafinil (PROVIGIL) 200 MG tablet modafinil 200 mg tablet    . Multiple Vitamin (MULTIVITAMIN) tablet Take 1 tablet by mouth daily.      . naproxen sodium (ANAPROX)  220 MG tablet Take 220 mg by mouth as needed.    . Omega-3 Fatty Acids (FISH OIL) 1000 MG CAPS Take 1 capsule by mouth daily.    . pantoprazole (PROTONIX) 20 MG tablet Take 1 tablet (20 mg total) by mouth daily. 30 minutes before food 30 tablet 11  . potassium chloride SA (KLOR-CON M20) 20 MEQ tablet Take 1 tablet (20 mEq total) by mouth daily. 90 tablet 3  . pyridOXINE (VITAMIN B-6) 100 MG tablet Take 200 mg by mouth daily.    . rosuvastatin (CRESTOR) 40 MG tablet Take 1 tablet (40 mg total) by mouth daily. At night 90 tablet 3  . SF 5000 PLUS 1.1 % CREA dental cream   0  . SUMAtriptan (IMITREX) 20 MG/ACT nasal spray sumatriptan 20 mg/actuation nasal spray    . topiramate (TOPAMAX) 100 MG tablet Take 1 tablet (100 mg total) by mouth 2 (two) times daily. 180 tablet 3  . Turmeric 500 MG TABS Take 2 tablets by mouth daily.    [name] venlafaxine XR (EFFEXOR XR) 37.5 MG 24 hr capsule Take 1 capsule (37.5 mg total) by mouth daily with breakfast. 90 capsule 1  . vitamin E 200 UNIT capsule Take 200 Units by mouth daily.     Current Facility-Administered Medications on File Prior to Visit  Medication Dose Route Frequency Provider Last Rate Last Dose  . 0.9 %  sodium chloride infusion  500 mL Intravenous Continuous Nandigam, Venia Minks, MD        PAST MEDICAL HISTORY: Past Medical History:  Diagnosis Date  . Abdominal hernia   . Abnormal Pap smear   . ALLERGIC RHINITIS 10/22/2006  . Allergy   . ANEMIA 12/18/2008  . Arthritis    scoliosis moderate deg changes lumbar Xray 11/04/17   . ASYMPTOMATIC POSTMENOPAUSAL STATUS 11/22/2007  . Back pain   . CAD (coronary artery disease)    in LAD  . Complication of anesthesia   . Degenerative arthritis   . DIABETES MELLITUS, TYPE II 01/13/2007  . Dyslipidemia   . Fibroid   . Frequent headaches   . GERD 07/20/2007  . GOITER, MULTINODULAR 07/20/2007  . Headache(784.0) 07/20/2007  . HEARING LOSS 11/22/2007  . Hiatal hernia   . Hiatal hernia    large  . History  of chicken pox   . Hx of colposcopy with cervical biopsy   . HYPERCHOLESTEROLEMIA 01/13/2007  . Hyperglycemia   . HYPERTENSION 10/22/2006  . Lung nodule    unchanged since 05/26/13   . Migraines   . NASH (nonalcoholic steatohepatitis)   . Nocturnal hypoxemia 02/17/2013  . Obesity   . Obstructive sleep apnea   . OSA on CPAP    per neurology  . OSTEOARTHRITIS 10/22/2006  . Other chronic nonalcoholic liver disease  07/20/2007  . Sedimentation rate elevation   . Sleep apnea    on cpap  . Urine incontinence   . UTI (urinary tract infection)     PAST SURGICAL HISTORY: Past Surgical History:  Procedure Laterality Date  . BREAST SURGERY  1984   Breast reduction b/l   . CATARACT EXTRACTION, BILATERAL    . DEXA  08/2005  . DILATION AND CURETTAGE OF UTERUS    . ELECTROCARDIOGRAM  10/15/2006  . ESOPHAGOGASTRODUODENOSCOPY  12/08/2005  . FOOT SURGERY     hammertoe and bunion 05/2017   . Stress Cardiolite  10/21/2005  . sweat gland removal    . WISDOM TOOTH EXTRACTION      SOCIAL HISTORY: Social History   Tobacco Use  . Smoking status: Former Smoker    Packs/day: 2.00    Years: 20.00    Pack years: 40.00    Types: Cigarettes    Quit date: 03/03/1979    Years since quitting: 39.8  . Smokeless tobacco: Never Used  Substance Use Topics  . Alcohol use: Yes    Comment: rare  . Drug use: No    FAMILY HISTORY: Family History  Problem Relation Age of Onset  . Asthma Mother   . Depression Mother   . Bipolar disorder Mother   . Dementia Mother   . Arthritis Mother   . Breast cancer Mother        primary  . Colon cancer Mother        mets from breast  . Cancer Mother        colon  . Hypertension Father   . Migraines Father   . Cancer Maternal Aunt        ?  [name] Heart disease Maternal Grandmother   . Cancer Maternal Grandfather        stomach ?    ROS: Review of Systems  Constitutional: Negative for weight loss.    PHYSICAL EXAM: Pt in no acute distress  RECENT LABS  AND TESTS: BMET    Component Value Date/Time   NA 141 12/05/2018 0810   K 4.1 12/05/2018 0810   CL 106 12/05/2018 0810   CO2 25 12/05/2018 0810   GLUCOSE 85 12/05/2018 0810   GLUCOSE 116 (H) 03/05/2016 0958   BUN 22 12/05/2018 0810   CREATININE 0.84 12/05/2018 0810   CREATININE 0.81 12/28/2011 0853   CALCIUM 9.5 12/05/2018 0810   GFRNONAA 70 12/05/2018 0810   GFRAA 80 12/05/2018 0810   Lab Results  Component Value Date   HGBA1C 5.6 12/05/2018   HGBA1C 5.6 04/27/2018   HGBA1C 6.0 (H) 12/01/2017   HGBA1C 5.7 (H) 07/29/2017   HGBA1C 5.9 03/15/2017   Lab Results  Component Value Date   INSULIN 6.4 12/05/2018   INSULIN 3.6 04/27/2018   INSULIN 5.6 12/01/2017   INSULIN 6.8 07/29/2017   INSULIN 20.9 12/23/2016   CBC    Component Value Date/Time   WBC 4.5 04/26/2018 1602   RBC 4.19 04/26/2018 1602   HGB 12.9 04/26/2018 1602   HGB 12.4 12/01/2017 1146   HCT 38.3 04/26/2018 1602   HCT 39.5 12/01/2017 1146   PLT 248.0 04/26/2018 1602   MCV 91.2 04/26/2018 1602   MCV 91 12/01/2017 1146   MCH 28.5 12/01/2017 1146   MCH 29.4 12/28/2011 0853   MCHC 33.7 04/26/2018 1602   RDW 16.7 (H) 04/26/2018 1602   RDW 16.2 (H) 12/01/2017 1146   LYMPHSABS 1.3 04/26/2018 1602   LYMPHSABS 1.2 12/01/2017 1146  MONOABS 0.4 04/26/2018 1602   EOSABS 0.1 04/26/2018 1602   EOSABS 0.1 12/01/2017 1146   BASOSABS 0.0 04/26/2018 1602   BASOSABS 0.0 12/01/2017 1146   Iron/TIBC/Ferritin/ %Sat    Component Value Date/Time   IRON 43 (L) 04/26/2018 1602   IRON 38 12/23/2016 0956   TIBC 249 (L) 04/26/2018 1602   TIBC 271 12/23/2016 0956   FERRITIN 60 04/26/2018 1602   FERRITIN 40 12/23/2016 0956   IRONPCTSAT 17 04/26/2018 1602   Lipid Panel     Component Value Date/Time   CHOL 178 12/05/2018 0810   TRIG 65 12/05/2018 0810   HDL 77 12/05/2018 0810   CHOLHDL 3 03/05/2016 0958   VLDL 26.0 03/05/2016 0958   LDLCALC 89 12/05/2018 0810   Hepatic Function Panel     Component Value  Date/Time   PROT 6.6 12/05/2018 0810   ALBUMIN 3.9 12/05/2018 0810   AST 19 12/05/2018 0810   ALT 14 12/05/2018 0810   ALKPHOS 65 12/05/2018 0810   BILITOT <0.2 12/05/2018 0810   BILIDIR 0.1 02/15/2015 1010   IBILI 0.2 12/28/2011 0853      Component Value Date/Time   TSH 1.41 04/26/2018 1602   TSH 1.170 12/23/2016 0956   TSH 1.76 03/05/2016 0958      I, [name], am acting as transcriptionist for [name], MD I have reviewed the above documentation for accuracy and completeness, and I agree with the above. -[name], MD

## 2019-01-03 DIAGNOSIS — R921 Mammographic calcification found on diagnostic imaging of breast: Secondary | ICD-10-CM | POA: Diagnosis not present

## 2019-01-05 ENCOUNTER — Ambulatory Visit (INDEPENDENT_AMBULATORY_CARE_PROVIDER_SITE_OTHER): Payer: Medicare Other | Admitting: Adult Health

## 2019-01-05 ENCOUNTER — Other Ambulatory Visit: Payer: Self-pay

## 2019-01-05 ENCOUNTER — Encounter: Payer: Self-pay | Admitting: Adult Health

## 2019-01-05 VITALS — BP 109/62 | HR 67 | Temp 97.8°F | Ht 64.5 in | Wt 198.6 lb

## 2019-01-05 DIAGNOSIS — G4733 Obstructive sleep apnea (adult) (pediatric): Secondary | ICD-10-CM | POA: Diagnosis not present

## 2019-01-05 DIAGNOSIS — Z9989 Dependence on other enabling machines and devices: Secondary | ICD-10-CM

## 2019-01-05 NOTE — Progress Notes (Signed)
[facility] notified of new cpap orders in epic.  Received. SY

## 2019-01-05 NOTE — Patient Instructions (Signed)

## 2019-01-05 NOTE — Progress Notes (Signed)
PATIENT: Heather [name] DOB: 27-Dec-1945  REASON FOR VISIT: follow up HISTORY FROM: patient  HISTORY OF PRESENT ILLNESS: Today 01/05/19:  Heather [name] is a 73 year old female with a history of obstructive sleep apnea on Bipap.  Her download indicates that she used her machine 29 out of 30 days for compliance of 97%.  She use her machine greater than 4 hours 27 days for compliance of 90%.  On average she uses her machine 5 hours and 27 minutes.  Her residual AHI is 0.2 on 12/8 cmH2O.  She does have a significant leak in the 95th percentile at 43.2 L/min.  She states that she does not feel the mask leaking at night.  She returns today for an evaluation.   REVIEW OF SYSTEMS: Out of a complete 14 system review of symptoms, the patient complains only of the following symptoms, and all other reviewed systems are negative.  See HPI  ALLERGIES: Allergies  Allergen Reactions  . Aspirin   . Coconut Oil   . Lisinopril     REACTION: cough  . Metoprolol Itching  . Peanut-Containing Drug Products   . Penicillins     REACTION: rash  . Prednisone   . Strawberry Extract     HOME MEDICATIONS: Outpatient Medications Prior to Visit  Medication Sig Dispense Refill  . acetaminophen (TYLENOL) 500 MG tablet Take 650 mg by mouth 3 (three) times daily as needed for pain.     . Calcium Carbonate-Vitamin D (CALCIUM 600 + D PO) Take 2 tablets by mouth daily.      . clopidogrel (PLAVIX) 75 MG tablet Take 1 tablet (75 mg total) by mouth daily. 90 tablet 3  . Ferrous Sulfate (IRON) 325 (65 FE) MG TABS Take 1 tablet by mouth daily.      . fexofenadine (ALLEGRA) 180 MG tablet Take 180 mg by mouth daily.    . furosemide (LASIX) 40 MG tablet Take 1 tablet (40 mg total) by mouth daily. 90 tablet 3  . gabapentin (NEURONTIN) 100 MG capsule Take 2 capsules (200 mg total) by mouth at bedtime. 180 capsule 3  . glucosamine-chondroitin 500-400 MG tablet Take 1 tablet by mouth 2 (two) times daily.      [name]  ipratropium (ATROVENT) 0.06 % nasal spray Place 2 sprays into both nostrils 4 (four) times daily. 15 mL 12  . loratadine (CLARITIN) 10 MG tablet Take 1 tablet (10 mg total) by mouth daily. 30 tablet 11  . metFORMIN (GLUCOPHAGE) 500 MG tablet Take 2 tablets (1,000 mg total) by mouth daily with breakfast. 180 tablet 3  . modafinil (PROVIGIL) 200 MG tablet modafinil 200 mg tablet    . Multiple Vitamin (MULTIVITAMIN) tablet Take 1 tablet by mouth daily.      . naproxen sodium (ANAPROX) 220 MG tablet Take 220 mg by mouth as needed.    . Omega-3 Fatty Acids (FISH OIL) 1000 MG CAPS Take 1 capsule by mouth daily.    . pantoprazole (PROTONIX) 20 MG tablet Take 1 tablet (20 mg total) by mouth daily. 30 minutes before food 30 tablet 11  . potassium chloride SA (KLOR-CON M20) 20 MEQ tablet Take 1 tablet (20 mEq total) by mouth daily. 90 tablet 3  . pyridOXINE (VITAMIN B-6) 100 MG tablet Take 200 mg by mouth daily.    . rosuvastatin (CRESTOR) 40 MG tablet Take 1 tablet (40 mg total) by mouth daily. At night 90 tablet 3  . SF 5000 PLUS 1.1 % CREA dental cream  0  . topiramate (TOPAMAX) 100 MG tablet Take 1 tablet (100 mg total) by mouth 2 (two) times daily. 180 tablet 3  . Turmeric 500 MG TABS Take 2 tablets by mouth daily.    [name] venlafaxine XR (EFFEXOR XR) 37.5 MG 24 hr capsule Take 1 capsule (37.5 mg total) by mouth daily with breakfast. 90 capsule 1  . vitamin E 200 UNIT capsule Take 200 Units by mouth daily.    . SUMAtriptan (IMITREX) 20 MG/ACT nasal spray sumatriptan 20 mg/actuation nasal spray     Facility-Administered Medications Prior to Visit  Medication Dose Route Frequency Provider Last Rate Last Dose  . 0.9 %  sodium chloride infusion  500 mL Intravenous Continuous Nandigam, Venia Minks, MD        PAST MEDICAL HISTORY: Past Medical History:  Diagnosis Date  . Abdominal hernia   . Abnormal Pap smear   . ALLERGIC RHINITIS 10/22/2006  . Allergy   . ANEMIA 12/18/2008  . Arthritis    scoliosis  moderate deg changes lumbar Xray 11/04/17   . ASYMPTOMATIC POSTMENOPAUSAL STATUS 11/22/2007  . Back pain   . CAD (coronary artery disease)    in LAD  . Complication of anesthesia   . Degenerative arthritis   . DIABETES MELLITUS, TYPE II 01/13/2007  . Dyslipidemia   . Fibroid   . Frequent headaches   . GERD 07/20/2007  . GOITER, MULTINODULAR 07/20/2007  . Headache(784.0) 07/20/2007  . HEARING LOSS 11/22/2007  . Hiatal hernia   . Hiatal hernia    large  . History of chicken pox   . Hx of colposcopy with cervical biopsy   . HYPERCHOLESTEROLEMIA 01/13/2007  . Hyperglycemia   . HYPERTENSION 10/22/2006  . Lung nodule    unchanged since 05/26/13   . Migraines   . NASH (nonalcoholic steatohepatitis)   . Nocturnal hypoxemia 02/17/2013  . Obesity   . Obstructive sleep apnea   . OSA on CPAP    per neurology  . OSTEOARTHRITIS 10/22/2006  . Other chronic nonalcoholic liver disease 3/84/6659  . Sedimentation rate elevation   . Sleep apnea    on cpap  . Urine incontinence   . UTI (urinary tract infection)     PAST SURGICAL HISTORY: Past Surgical History:  Procedure Laterality Date  . BREAST SURGERY  1984   Breast reduction b/l   . CATARACT EXTRACTION, BILATERAL    . DEXA  08/2005  . DILATION AND CURETTAGE OF UTERUS    . ELECTROCARDIOGRAM  10/15/2006  . ESOPHAGOGASTRODUODENOSCOPY  12/08/2005  . FOOT SURGERY     hammertoe and bunion 05/2017   . Stress Cardiolite  10/21/2005  . sweat gland removal    . WISDOM TOOTH EXTRACTION      FAMILY HISTORY: Family History  Problem Relation Age of Onset  . Asthma Mother   . Depression Mother   . Bipolar disorder Mother   . Dementia Mother   . Arthritis Mother   . Breast cancer Mother        primary  . Colon cancer Mother        mets from breast  . Cancer Mother        colon  . Hypertension Father   . Migraines Father   . Cancer Maternal Aunt        ?  [name] Heart disease Maternal Grandmother   . Cancer Maternal Grandfather         stomach ?    SOCIAL HISTORY: Social History  Socioeconomic History  . Marital status: Married    Spouse name: Heather [name]  . Number of children: 2  . Years of education: College  . Highest education level: Not on file  Occupational History  . Occupation: Retired  Scientific laboratory technician  . Financial resource strain: Not hard at all  . Food insecurity    Worry: Never true    Inability: Never true  . Transportation needs    Medical: No    Non-medical: No  Tobacco Use  . Smoking status: Former Smoker    Packs/day: 2.00    Years: 20.00    Pack years: 40.00    Types: Cigarettes    Quit date: 03/03/1979    Years since quitting: 39.8  . Smokeless tobacco: Never Used  Substance and Sexual Activity  . Alcohol use: Yes    Comment: rare  . Drug use: No  . Sexual activity: Yes    Birth control/protection: Surgical  Lifestyle  . Physical activity    Days per week: 4 days    Minutes per session: 60 min  . Stress: Not at all  Relationships  . Social Herbalist on phone: Not on file    Gets together: Not on file    Attends religious service: Not on file    Active member of club or organization: Not on file    Attends meetings of clubs or organizations: Not on file    Relationship status: Not on file  . Intimate partner violence    Fear of current or ex partner: No    Emotionally abused: No    Physically abused: No    Forced sexual activity: No  Other Topics Concern  . Not on file  Social History Narrative   Patient is married Heather [name]).   Patient has two children; 3 pregnancies 2 live births    Patient is retired Designer, jewellery, Chiropodist    Patient has a Financial risk analyst.   Patient is right-handed.   Patient lives at home with family.   Caffeine Use: 2 soda every other day   Former smoker 20+ years ago 2 ppd quit in 45s   No guns, wears seat belt, safe in relationship       PHYSICAL EXAM  Vitals:   01/05/19 1401  BP:  109/62  Pulse: 67  Temp: 97.8 F (36.6 C)  Weight: 198 lb 9.6 oz (90.1 kg)  Height: 5' 4.5" (1.638 m)   Body mass index is 33.56 kg/m.  Generalized: Well developed, in no acute distress  Chest: Lungs clear to auscultation bilaterally  Neurological examination  Mentation: Alert oriented to time, place, history taking. Follows all commands speech and language fluent Cranial nerve II-XII: Extraocular movements were full, visual field were full on confrontational test Head turning and shoulder shrug  were normal and symmetric. Motor: The motor testing reveals 5 over 5 strength of all 4 extremities. Good symmetric motor tone is noted throughout.  Sensory: Sensory testing is intact to soft touch on all 4 extremities. No evidence of extinction is noted.  Gait and station: Gait is normal.   DIAGNOSTIC DATA (LABS, IMAGING, TESTING) - I reviewed patient records, labs, notes, testing and imaging myself where available.  Lab Results  Component Value Date   WBC 4.5 04/26/2018   HGB 12.9 04/26/2018   HCT 38.3 04/26/2018   MCV 91.2 04/26/2018   PLT 248.0 04/26/2018      Component Value Date/Time   NA  141 12/05/2018 0810   K 4.1 12/05/2018 0810   CL 106 12/05/2018 0810   CO2 25 12/05/2018 0810   GLUCOSE 85 12/05/2018 0810   GLUCOSE 116 (H) 03/05/2016 0958   BUN 22 12/05/2018 0810   CREATININE 0.84 12/05/2018 0810   CREATININE 0.81 12/28/2011 0853   CALCIUM 9.5 12/05/2018 0810   PROT 6.6 12/05/2018 0810   ALBUMIN 3.9 12/05/2018 0810   AST 19 12/05/2018 0810   ALT 14 12/05/2018 0810   ALKPHOS 65 12/05/2018 0810   BILITOT <0.2 12/05/2018 0810   GFRNONAA 70 12/05/2018 0810   GFRAA 80 12/05/2018 0810   Lab Results  Component Value Date   CHOL 178 12/05/2018   HDL 77 12/05/2018   LDLCALC 89 12/05/2018   TRIG 65 12/05/2018   CHOLHDL 3 03/05/2016   Lab Results  Component Value Date   HGBA1C 5.6 12/05/2018   Lab Results  Component Value Date   VITAMINB12 1,420 (H) 04/26/2018    Lab Results  Component Value Date   TSH 1.41 04/26/2018      ASSESSMENT AND PLAN 73 y.o. year old female  has a past medical history of Abdominal hernia, Abnormal Pap smear, ALLERGIC RHINITIS (10/22/2006), Allergy, ANEMIA (12/18/2008), Arthritis, ASYMPTOMATIC POSTMENOPAUSAL STATUS (11/22/2007), Back pain, CAD (coronary artery disease), Complication of anesthesia, Degenerative arthritis, DIABETES MELLITUS, TYPE II (01/13/2007), Dyslipidemia, Fibroid, Frequent headaches, GERD (07/20/2007), GOITER, MULTINODULAR (07/20/2007), Headache(784.0) (07/20/2007), HEARING LOSS (11/22/2007), Hiatal hernia, Hiatal hernia, History of chicken pox, colposcopy with cervical biopsy, HYPERCHOLESTEROLEMIA (01/13/2007), Hyperglycemia, HYPERTENSION (10/22/2006), Lung nodule, Migraines, NASH (nonalcoholic steatohepatitis), Nocturnal hypoxemia (02/17/2013), Obesity, Obstructive sleep apnea, OSA on CPAP, OSTEOARTHRITIS (10/22/2006), Other chronic nonalcoholic liver disease (0/06/1100), Sedimentation rate elevation, Sleep apnea, Urine incontinence, and UTI (urinary tract infection). here with :  1.  Obstructive sleep apnea on CPAP   Patient CPAP download shows excellent compliance and good treatment of her apnea.  She is encouraged to continue using CPAP nightly and greater than 4 hours each night.  She will be sent for mask refitting.  She is advised that if her symptoms worsen or she develops new symptoms she should let us know.  She will follow-up in 1 year or sooner if needed    I spent 15 minutes with the patient. 50% of this time was spent reviewing CPAP download   Ward Givens, MSN, NP-C 01/05/2019, 2:10 PM [facility] [address] [place], [place] [address] [phone]

## 2019-01-11 ENCOUNTER — Telehealth (INDEPENDENT_AMBULATORY_CARE_PROVIDER_SITE_OTHER): Payer: Medicare Other | Admitting: Family Medicine

## 2019-01-11 ENCOUNTER — Encounter (INDEPENDENT_AMBULATORY_CARE_PROVIDER_SITE_OTHER): Payer: Self-pay | Admitting: Family Medicine

## 2019-01-11 ENCOUNTER — Other Ambulatory Visit: Payer: Self-pay

## 2019-01-11 DIAGNOSIS — R7303 Prediabetes: Secondary | ICD-10-CM | POA: Diagnosis not present

## 2019-01-11 DIAGNOSIS — E669 Obesity, unspecified: Secondary | ICD-10-CM

## 2019-01-11 DIAGNOSIS — Z6833 Body mass index (BMI) 33.0-33.9, adult: Secondary | ICD-10-CM | POA: Diagnosis not present

## 2019-01-11 NOTE — Progress Notes (Signed)
Office: [phone]  /  Fax: [phone] TeleHealth Visit:  Heather [name] has verbally consented to this TeleHealth visit today. The patient is located at home, the provider is located at the [facility] office. The participants in this visit include the listed provider, patient, and the patient's spouse, Heather [name]. The visit was conducted today via telephone call (Doxy failed - changed to telephone call).  HPI:   Chief Complaint: OBESITY Heather [name] is here to discuss her progress with her obesity treatment plan. She is journaling and is following her eating plan approximately 100% of the time. She states she is exercising on the elliptical 30 minutes 3-4 times per week. Heather [name] has worked on [employer], [employer]. She has struggled with using the [employer] journaling app, but feels she is getting better with this.  We were unable to weigh the patient today for this TeleHealth visit. She feels as if she has maintained her weight since her last visit. She has lost 73 lbs since starting treatment with [name].  Pre-Diabetes Heather [name] has a diagnosis of prediabetes based on her elevated Hgb A1c and was informed this puts her at greater risk of developing diabetes. Most recent A1c was well controlled at 5.6 on 12/05/2018. She is having some abdominal pain, for which she has an appointment with her PCP to discuss, but doesn't feel it is related to her metformin. She continues to work on diet and exercise to decrease risk of diabetes. She denies nausea or hypoglycemia.  ASSESSMENT AND PLAN:  Prediabetes  Class 1 obesity with serious comorbidity and body mass index (BMI) of 33.0 to 33.9 in adult, unspecified obesity type  PLAN:  Pre-Diabetes Heather [name] will continue to work on weight loss, exercise, and decreasing simple carbohydrates in her diet to help decrease the risk of diabetes. We dicussed metformin including benefits and risks. She was informed that eating too many simple carbohydrates or too many  calories at one sitting increases the likelihood of GI side effects. Heather [name] will continue metformin, diet and exercise. She will follow-up as directed.  I spent > than 50% of the 25 minute visit on counseling as documented in the note.  Obesity Heather [name] is currently in the action stage of change. As such, her goal is to continue with weight loss efforts. She has agreed to keep a food journal with 1200 calories and 75+ grams of protein.  Heather [name] has been instructed to work up to a goal of 150 minutes of combined cardio and strengthening exercise per week for weight loss and overall health benefits. We discussed the following Behavioral Modification Strategies today: increase H20 intake and holiday eating strategies.  Heather [name] has agreed to follow-up with our clinic in 3 weeks. She was informed of the importance of frequent follow-up visits to maximize her success with intensive lifestyle modifications for her multiple health conditions.  ALLERGIES: Allergies  Allergen Reactions  . Aspirin   . Coconut Oil   . Lisinopril     REACTION: cough  . Metoprolol Itching  . Peanut-Containing Drug Products   . Penicillins     REACTION: rash  . Prednisone   . Strawberry Extract     MEDICATIONS: Current Outpatient Medications on File Prior to Visit  Medication Sig Dispense Refill  . acetaminophen (TYLENOL) 500 MG tablet Take 650 mg by mouth 3 (three) times daily as needed for pain.     . Calcium Carbonate-Vitamin D (CALCIUM 600 + D PO) Take 2 tablets by mouth daily.      [name]  clopidogrel (PLAVIX) 75 MG tablet Take 1 tablet (75 mg total) by mouth daily. 90 tablet 3  . Ferrous Sulfate (IRON) 325 (65 FE) MG TABS Take 1 tablet by mouth daily.      . fexofenadine (ALLEGRA) 180 MG tablet Take 180 mg by mouth daily.    . furosemide (LASIX) 40 MG tablet Take 1 tablet (40 mg total) by mouth daily. 90 tablet 3  . gabapentin (NEURONTIN) 100 MG capsule Take 2 capsules (200 mg total) by mouth at bedtime. 180 capsule 3   . glucosamine-chondroitin 500-400 MG tablet Take 1 tablet by mouth 2 (two) times daily.      [name] ipratropium (ATROVENT) 0.06 % nasal spray Place 2 sprays into both nostrils 4 (four) times daily. 15 mL 12  . loratadine (CLARITIN) 10 MG tablet Take 1 tablet (10 mg total) by mouth daily. 30 tablet 11  . metFORMIN (GLUCOPHAGE) 500 MG tablet Take 2 tablets (1,000 mg total) by mouth daily with breakfast. 180 tablet 3  . modafinil (PROVIGIL) 200 MG tablet modafinil 200 mg tablet    . Multiple Vitamin (MULTIVITAMIN) tablet Take 1 tablet by mouth daily.      . naproxen sodium (ANAPROX) 220 MG tablet Take 220 mg by mouth as needed.    . Omega-3 Fatty Acids (FISH OIL) 1000 MG CAPS Take 1 capsule by mouth daily.    . pantoprazole (PROTONIX) 20 MG tablet Take 1 tablet (20 mg total) by mouth daily. 30 minutes before food 30 tablet 11  . potassium chloride SA (KLOR-CON M20) 20 MEQ tablet Take 1 tablet (20 mEq total) by mouth daily. 90 tablet 3  . pyridOXINE (VITAMIN B-6) 100 MG tablet Take 200 mg by mouth daily.    . rosuvastatin (CRESTOR) 40 MG tablet Take 1 tablet (40 mg total) by mouth daily. At night 90 tablet 3  . SF 5000 PLUS 1.1 % CREA dental cream   0  . SUMAtriptan (IMITREX) 20 MG/ACT nasal spray sumatriptan 20 mg/actuation nasal spray    . topiramate (TOPAMAX) 100 MG tablet Take 1 tablet (100 mg total) by mouth 2 (two) times daily. 180 tablet 3  . Turmeric 500 MG TABS Take 2 tablets by mouth daily.    [name] venlafaxine XR (EFFEXOR XR) 37.5 MG 24 hr capsule Take 1 capsule (37.5 mg total) by mouth daily with breakfast. 90 capsule 1  . vitamin E 200 UNIT capsule Take 200 Units by mouth daily.     Current Facility-Administered Medications on File Prior to Visit  Medication Dose Route Frequency Provider Last Rate Last Dose  . 0.9 %  sodium chloride infusion  500 mL Intravenous Continuous Nandigam, Venia Minks, MD        PAST MEDICAL HISTORY: Past Medical History:  Diagnosis Date  . Abdominal hernia   .  Abnormal Pap smear   . ALLERGIC RHINITIS 10/22/2006  . Allergy   . ANEMIA 12/18/2008  . Arthritis    scoliosis moderate deg changes lumbar Xray 11/04/17   . ASYMPTOMATIC POSTMENOPAUSAL STATUS 11/22/2007  . Back pain   . CAD (coronary artery disease)    in LAD  . Complication of anesthesia   . Degenerative arthritis   . DIABETES MELLITUS, TYPE II 01/13/2007  . Dyslipidemia   . Fibroid   . Frequent headaches   . GERD 07/20/2007  . GOITER, MULTINODULAR 07/20/2007  . Headache(784.0) 07/20/2007  . HEARING LOSS 11/22/2007  . Hiatal hernia   . Hiatal hernia    large  .  History of chicken pox   . Hx of colposcopy with cervical biopsy   . HYPERCHOLESTEROLEMIA 01/13/2007  . Hyperglycemia   . HYPERTENSION 10/22/2006  . Lung nodule    unchanged since 05/26/13   . Migraines   . NASH (nonalcoholic steatohepatitis)   . Nocturnal hypoxemia 02/17/2013  . Obesity   . Obstructive sleep apnea   . OSA on CPAP    per neurology  . OSTEOARTHRITIS 10/22/2006  . Other chronic nonalcoholic liver disease 0/25/4270  . Sedimentation rate elevation   . Sleep apnea    on cpap  . Urine incontinence   . UTI (urinary tract infection)     PAST SURGICAL HISTORY: Past Surgical History:  Procedure Laterality Date  . BREAST SURGERY  1984   Breast reduction b/l   . CATARACT EXTRACTION, BILATERAL    . DEXA  08/2005  . DILATION AND CURETTAGE OF UTERUS    . ELECTROCARDIOGRAM  10/15/2006  . ESOPHAGOGASTRODUODENOSCOPY  12/08/2005  . FOOT SURGERY     hammertoe and bunion 05/2017   . Stress Cardiolite  10/21/2005  . sweat gland removal    . WISDOM TOOTH EXTRACTION      SOCIAL HISTORY: Social History   Tobacco Use  . Smoking status: Former Smoker    Packs/day: 2.00    Years: 20.00    Pack years: 40.00    Types: Cigarettes    Quit date: 03/03/1979    Years since quitting: 39.8  . Smokeless tobacco: Never Used  Substance Use Topics  . Alcohol use: Yes    Comment: rare  . Drug use: No    FAMILY HISTORY:  Family History  Problem Relation Age of Onset  . Asthma Mother   . Depression Mother   . Bipolar disorder Mother   . Dementia Mother   . Arthritis Mother   . Breast cancer Mother        primary  . Colon cancer Mother        mets from breast  . Cancer Mother        colon  . Hypertension Father   . Migraines Father   . Cancer Maternal Aunt        ?  [name] Heart disease Maternal Grandmother   . Cancer Maternal Grandfather        stomach ?   ROS: Review of Systems  Gastrointestinal: Negative for nausea.  Endo/Heme/Allergies:       Negative for hypoglycemia.   PHYSICAL EXAM: Pt in no acute distress  RECENT LABS AND TESTS: BMET    Component Value Date/Time   NA 141 12/05/2018 0810   K 4.1 12/05/2018 0810   CL 106 12/05/2018 0810   CO2 25 12/05/2018 0810   GLUCOSE 85 12/05/2018 0810   GLUCOSE 116 (H) 03/05/2016 0958   BUN 22 12/05/2018 0810   CREATININE 0.84 12/05/2018 0810   CREATININE 0.81 12/28/2011 0853   CALCIUM 9.5 12/05/2018 0810   GFRNONAA 70 12/05/2018 0810   GFRAA 80 12/05/2018 0810   Lab Results  Component Value Date   HGBA1C 5.6 12/05/2018   HGBA1C 5.6 04/27/2018   HGBA1C 6.0 (H) 12/01/2017   HGBA1C 5.7 (H) 07/29/2017   HGBA1C 5.9 03/15/2017   Lab Results  Component Value Date   INSULIN 6.4 12/05/2018   INSULIN 3.6 04/27/2018   INSULIN 5.6 12/01/2017   INSULIN 6.8 07/29/2017   INSULIN 20.9 12/23/2016   CBC    Component Value Date/Time   WBC 4.5 04/26/2018 1602  RBC 4.19 04/26/2018 1602   HGB 12.9 04/26/2018 1602   HGB 12.4 12/01/2017 1146   HCT 38.3 04/26/2018 1602   HCT 39.5 12/01/2017 1146   PLT 248.0 04/26/2018 1602   MCV 91.2 04/26/2018 1602   MCV 91 12/01/2017 1146   MCH 28.5 12/01/2017 1146   MCH 29.4 12/28/2011 0853   MCHC 33.7 04/26/2018 1602   RDW 16.7 (H) 04/26/2018 1602   RDW 16.2 (H) 12/01/2017 1146   LYMPHSABS 1.3 04/26/2018 1602   LYMPHSABS 1.2 12/01/2017 1146   MONOABS 0.4 04/26/2018 1602   EOSABS 0.1 04/26/2018 1602    EOSABS 0.1 12/01/2017 1146   BASOSABS 0.0 04/26/2018 1602   BASOSABS 0.0 12/01/2017 1146   Iron/TIBC/Ferritin/ %Sat    Component Value Date/Time   IRON 43 (L) 04/26/2018 1602   IRON 38 12/23/2016 0956   TIBC 249 (L) 04/26/2018 1602   TIBC 271 12/23/2016 0956   FERRITIN 60 04/26/2018 1602   FERRITIN 40 12/23/2016 0956   IRONPCTSAT 17 04/26/2018 1602   Lipid Panel     Component Value Date/Time   CHOL 178 12/05/2018 0810   TRIG 65 12/05/2018 0810   HDL 77 12/05/2018 0810   CHOLHDL 3 03/05/2016 0958   VLDL 26.0 03/05/2016 0958   LDLCALC 89 12/05/2018 0810   Hepatic Function Panel     Component Value Date/Time   PROT 6.6 12/05/2018 0810   ALBUMIN 3.9 12/05/2018 0810   AST 19 12/05/2018 0810   ALT 14 12/05/2018 0810   ALKPHOS 65 12/05/2018 0810   BILITOT <0.2 12/05/2018 0810   BILIDIR 0.1 02/15/2015 1010   IBILI 0.2 12/28/2011 0853      Component Value Date/Time   TSH 1.41 04/26/2018 1602   TSH 1.170 12/23/2016 0956   TSH 1.76 03/05/2016 0958   Results for VELENCIA, LENART (MRN [id number]) as of 01/11/2019 12:18  Ref. Range 12/05/2018 08:10  Vitamin D, 25-Hydroxy Latest Ref Range: 30.0 - 100.0 ng/mL 35.1   I, Michaelene Song, am acting as Location manager for [name], MD I have reviewed the above documentation for accuracy and completeness, and I agree with the above. -[name], MD

## 2019-01-12 ENCOUNTER — Encounter: Payer: Self-pay | Admitting: Adult Health

## 2019-01-12 ENCOUNTER — Encounter: Payer: Self-pay | Admitting: Internal Medicine

## 2019-01-17 ENCOUNTER — Telehealth: Payer: Self-pay | Admitting: Internal Medicine

## 2019-01-17 ENCOUNTER — Encounter: Payer: Self-pay | Admitting: Podiatry

## 2019-01-17 ENCOUNTER — Ambulatory Visit (INDEPENDENT_AMBULATORY_CARE_PROVIDER_SITE_OTHER): Payer: Medicare Other | Admitting: Podiatry

## 2019-01-17 ENCOUNTER — Other Ambulatory Visit: Payer: Self-pay

## 2019-01-17 DIAGNOSIS — M79676 Pain in unspecified toe(s): Secondary | ICD-10-CM

## 2019-01-17 DIAGNOSIS — Q828 Other specified congenital malformations of skin: Secondary | ICD-10-CM

## 2019-01-17 DIAGNOSIS — B351 Tinea unguium: Secondary | ICD-10-CM

## 2019-01-17 NOTE — Telephone Encounter (Signed)
I called pt twice and left a vm to call ofc.

## 2019-01-18 ENCOUNTER — Ambulatory Visit (INDEPENDENT_AMBULATORY_CARE_PROVIDER_SITE_OTHER): Payer: Medicare Other | Admitting: Internal Medicine

## 2019-01-18 ENCOUNTER — Encounter: Payer: Self-pay | Admitting: Internal Medicine

## 2019-01-18 ENCOUNTER — Other Ambulatory Visit: Payer: Self-pay

## 2019-01-18 VITALS — BP 118/62 | HR 71 | Temp 97.0°F | Ht 64.5 in | Wt 200.2 lb

## 2019-01-18 DIAGNOSIS — R2241 Localized swelling, mass and lump, right lower limb: Secondary | ICD-10-CM

## 2019-01-18 DIAGNOSIS — K449 Diaphragmatic hernia without obstruction or gangrene: Secondary | ICD-10-CM | POA: Diagnosis not present

## 2019-01-18 DIAGNOSIS — K5790 Diverticulosis of intestine, part unspecified, without perforation or abscess without bleeding: Secondary | ICD-10-CM | POA: Diagnosis not present

## 2019-01-18 DIAGNOSIS — E669 Obesity, unspecified: Secondary | ICD-10-CM

## 2019-01-18 DIAGNOSIS — R2232 Localized swelling, mass and lump, left upper limb: Secondary | ICD-10-CM | POA: Insufficient documentation

## 2019-01-18 DIAGNOSIS — R911 Solitary pulmonary nodule: Secondary | ICD-10-CM | POA: Diagnosis not present

## 2019-01-18 DIAGNOSIS — E049 Nontoxic goiter, unspecified: Secondary | ICD-10-CM

## 2019-01-18 DIAGNOSIS — K439 Ventral hernia without obstruction or gangrene: Secondary | ICD-10-CM | POA: Insufficient documentation

## 2019-01-18 DIAGNOSIS — D649 Anemia, unspecified: Secondary | ICD-10-CM

## 2019-01-18 DIAGNOSIS — Z23 Encounter for immunization: Secondary | ICD-10-CM

## 2019-01-18 DIAGNOSIS — D219 Benign neoplasm of connective and other soft tissue, unspecified: Secondary | ICD-10-CM | POA: Diagnosis not present

## 2019-01-18 DIAGNOSIS — K429 Umbilical hernia without obstruction or gangrene: Secondary | ICD-10-CM | POA: Diagnosis not present

## 2019-01-18 NOTE — Progress Notes (Signed)
Chief Complaint  Patient presents with   Follow-up   F/u with husband  1. DM 2 A1C 5.6 12/05/2018 she wants to get off metformin will d/c she is eating right and weight has trended down  2. Left wrist mass w/o pain x 1 month but increasing in size nothing tried has had prior in a different location  3. Reviewed CT chest and ab/pelvis with large hiatal hernia containing part of stomach and colon and periumbilical ab hernia noted CT ab/pelvis containing small bowel, appendix and fat and uterine fibroids reviewed imaging with pt today. She denies any GERD sxs She reports mild to moderate intermittent lower ab pain at times and bulging in abdomen which she is able to push back in. Denies GI/GU blood and is having bowel movements she uses miralax prn and probiotics otc.  She never had a f/u with surgery Dr. Johnathan Hausen in [place] and requests referral and f/u again with MD in GI   Review of Systems  Constitutional: Negative for weight loss.  HENT: Negative for hearing loss.   Eyes: Negative for blurred vision.  Respiratory: Negative for shortness of breath.   Cardiovascular: Negative for chest pain.  Gastrointestinal: Positive for abdominal pain. Negative for constipation and heartburn.  Musculoskeletal: Negative for falls.       +left wrist mass    Skin: Negative for rash.  Neurological: Negative for headaches.  Psychiatric/Behavioral: Negative for depression.   Past Medical History:  Diagnosis Date   Abdominal hernia    Abnormal Pap smear    ALLERGIC RHINITIS 10/22/2006   Allergy    ANEMIA 12/18/2008   Arthritis    scoliosis moderate deg changes lumbar Xray 11/04/17    ASYMPTOMATIC POSTMENOPAUSAL STATUS 11/22/2007   Back pain    CAD (coronary artery disease)    in LAD   Complication of anesthesia    Degenerative arthritis    DIABETES MELLITUS, TYPE II 01/13/2007   Diverticulosis    Dyslipidemia    Fibroid    Frequent headaches    GERD 07/20/2007   GOITER,  MULTINODULAR 07/20/2007   Headache(784.0) 07/20/2007   HEARING LOSS 11/22/2007   Hiatal hernia    Hiatal hernia    large   History of chicken pox    Hx of colposcopy with cervical biopsy    HYPERCHOLESTEROLEMIA 01/13/2007   Hyperglycemia    HYPERTENSION 10/22/2006   Lung nodule    unchanged since 05/26/13    Migraines    NASH (nonalcoholic steatohepatitis)    Nocturnal hypoxemia 02/17/2013   Obesity    Obstructive sleep apnea    OSA on CPAP    per neurology   OSTEOARTHRITIS 10/22/2006   Other chronic nonalcoholic liver disease 7/37/1062   Sedimentation rate elevation    Sleep apnea    on cpap   Urine incontinence    UTI (urinary tract infection)    Past Surgical History:  Procedure Laterality Date   BREAST SURGERY  1984   Breast reduction b/l    CATARACT EXTRACTION, BILATERAL     DEXA  08/2005   DILATION AND CURETTAGE OF UTERUS     ELECTROCARDIOGRAM  10/15/2006   ESOPHAGOGASTRODUODENOSCOPY  12/08/2005   FOOT SURGERY     hammertoe and bunion 05/2017    Stress Cardiolite  10/21/2005   sweat gland removal     WISDOM TOOTH EXTRACTION     Family History  Problem Relation Age of Onset   Asthma Mother    Depression Mother  Bipolar disorder Mother    Dementia Mother    Arthritis Mother    Breast cancer Mother        primary   Colon cancer Mother        mets from breast   Cancer Mother        colon   Hypertension Father    Migraines Father    Cancer Maternal Aunt        ?   Heart disease Maternal Grandmother    Cancer Maternal Grandfather        stomach ?   Social History   Socioeconomic History   Marital status: Married    Spouse name: [name]   Number of children: 2   Years of education: College   Highest education level: Not on file  Occupational History   Occupation: Retired  Scientist, product/process development strain: Not hard at [employer] insecurity    Worry: Never true    Inability: Never true    Transportation needs    Medical: No    Non-medical: No  Tobacco Use   Smoking status: Former Smoker    Packs/day: 2.00    Years: 20.00    Pack years: 40.00    Types: Cigarettes    Quit date: 03/03/1979    Years since quitting: 39.9   Smokeless tobacco: Never Used  Substance and Sexual Activity   Alcohol use: Yes    Comment: rare   Drug use: No   Sexual activity: Yes    Birth control/protection: Surgical  Lifestyle   Physical activity    Days per week: 4 days    Minutes per session: 60 min   Stress: Not at all  Relationships   Social connections    Talks on phone: Not on file    Gets together: Not on file    Attends religious service: Not on file    Active member of club or organization: Not on file    Attends meetings of clubs or organizations: Not on file    Relationship status: Not on file   Intimate partner violence    Fear of current or ex partner: No    Emotionally abused: No    Physically abused: No    Forced sexual activity: No  Other Topics Concern   Not on file  Social History Narrative   Patient is married [name]).   Patient has two children; 3 pregnancies 2 live births    Patient is retired Designer, jewellery, Chiropodist    Patient has a Financial risk analyst.   Patient is right-handed.   Patient lives at home with family.   Caffeine Use: 2 soda every other day   Former smoker 20+ years ago 2 ppd quit in 50s   No guns, wears seat belt, safe in relationship    Current Meds  Medication Sig   acetaminophen (TYLENOL) 500 MG tablet Take 650 mg by mouth 3 (three) times daily as needed for pain.    Calcium Carbonate-Vitamin D (CALCIUM 600 + D PO) Take 2 tablets by mouth daily.     cetirizine (ZYRTEC) 10 MG tablet Take 10 mg by mouth daily.   clopidogrel (PLAVIX) 75 MG tablet Take 1 tablet (75 mg total) by mouth daily.   Ferrous Sulfate (IRON) 325 (65 FE) MG TABS Take 1 tablet by mouth daily.     furosemide  (LASIX) 40 MG tablet Take 1 tablet (40 mg total) by mouth daily.  gabapentin (NEURONTIN) 100 MG capsule Take 2 capsules (200 mg total) by mouth at bedtime.   glucosamine-chondroitin 500-400 MG tablet Take 1 tablet by mouth 2 (two) times daily.     ipratropium (ATROVENT) 0.06 % nasal spray Place 2 sprays into both nostrils 4 (four) times daily.   modafinil (PROVIGIL) 200 MG tablet modafinil 200 mg tablet   Multiple Vitamin (MULTIVITAMIN) tablet Take 1 tablet by mouth daily.     naproxen sodium (ANAPROX) 220 MG tablet Take 220 mg by mouth as needed.   Omega-3 Fatty Acids (FISH OIL) 1000 MG CAPS Take 1 capsule by mouth daily.   pantoprazole (PROTONIX) 20 MG tablet Take 1 tablet (20 mg total) by mouth daily. 30 minutes before food   polyethylene glycol (MIRALAX / GLYCOLAX) 17 g packet Take 17 g by mouth daily as needed.   potassium chloride SA (KLOR-CON M20) 20 MEQ tablet Take 1 tablet (20 mEq total) by mouth daily.   Probiotic Product (PROBIOTIC DAILY PO) Take by mouth daily as needed.   pyridOXINE (VITAMIN B-6) 100 MG tablet Take 200 mg by mouth daily.   rosuvastatin (CRESTOR) 40 MG tablet Take 1 tablet (40 mg total) by mouth daily. At night   SF 5000 PLUS 1.1 % CREA dental cream    SUMAtriptan (IMITREX) 20 MG/ACT nasal spray sumatriptan 20 mg/actuation nasal spray   topiramate (TOPAMAX) 100 MG tablet Take 1 tablet (100 mg total) by mouth 2 (two) times daily.   Turmeric 500 MG TABS Take 2 tablets by mouth daily.   venlafaxine XR (EFFEXOR XR) 37.5 MG 24 hr capsule Take 1 capsule (37.5 mg total) by mouth daily with breakfast.   vitamin E 200 UNIT capsule Take 200 Units by mouth daily.   [DISCONTINUED] loratadine (CLARITIN) 10 MG tablet Take 1 tablet (10 mg total) by mouth daily.   [DISCONTINUED] metFORMIN (GLUCOPHAGE) 500 MG tablet Take 2 tablets (1,000 mg total) by mouth daily with breakfast.   Current Facility-Administered Medications for the 01/18/19 encounter (Office  Visit) with McLean-Scocuzza, [name], MD  Medication   0.9 %  sodium chloride infusion   Allergies  Allergen Reactions   Aspirin    Coconut Oil    Lisinopril     REACTION: cough   Metoprolol Itching   Peanut-Containing Drug Products    Penicillins     REACTION: rash   Prednisone    Strawberry Extract    Recent Results (from the past 2160 hour(s))  Comprehensive metabolic panel     Status: None   Collection Time: 12/05/18  8:10 AM  Result Value Ref Range   Glucose 85 65 - 99 mg/dL   BUN 22 8 - 27 mg/dL   Creatinine, Ser 0.84 0.57 - 1.00 mg/dL   GFR calc non Af Amer 70 >59 mL/min/1.73   GFR calc Af Amer 80 >59 mL/min/1.73   BUN/Creatinine Ratio 26 12 - 28   Sodium 141 134 - 144 mmol/L   Potassium 4.1 3.5 - 5.2 mmol/L   Chloride 106 96 - 106 mmol/L   CO2 25 20 - 29 mmol/L   Calcium 9.5 8.7 - 10.3 mg/dL   Total Protein 6.6 6.0 - 8.5 g/dL   Albumin 3.9 3.7 - 4.7 g/dL   Globulin, Total 2.7 1.5 - 4.5 g/dL   Albumin/Globulin Ratio 1.4 1.2 - 2.2   Bilirubin Total <0.2 0.0 - 1.2 mg/dL   Alkaline Phosphatase 65 39 - 117 IU/L   AST 19 0 - 40 IU/L   ALT  14 0 - 32 IU/L  Hemoglobin A1c     Status: None   Collection Time: 12/05/18  8:10 AM  Result Value Ref Range   Hgb A1c MFr Bld 5.6 4.8 - 5.6 %    Comment:          Prediabetes: 5.7 - 6.4          Diabetes: >6.4          Glycemic control for adults with diabetes: <7.0    Est. average glucose Bld gHb Est-mCnc 114 mg/dL  Insulin, random     Status: None   Collection Time: 12/05/18  8:10 AM  Result Value Ref Range   INSULIN 6.4 2.6 - 24.9 uIU/mL  Lipid Panel With LDL/HDL Ratio     Status: None   Collection Time: 12/05/18  8:10 AM  Result Value Ref Range   Cholesterol, Total 178 100 - 199 mg/dL   Triglycerides 65 0 - 149 mg/dL   HDL 77 >39 mg/dL   VLDL Cholesterol Cal 12 5 - 40 mg/dL   LDL Chol Calc (NIH) 89 0 - 99 mg/dL   LDL/HDL Ratio 1.2 0.0 - 3.2 ratio    Comment:                                     LDL/HDL  Ratio                                             Men  Women                               1/2 Avg.Risk  1.0    1.5                                   Avg.Risk  3.6    3.2                                2X Avg.Risk  6.2    5.0                                3X Avg.Risk  8.0    6.1   VITAMIN D 25 Hydroxy (Vit-D Deficiency, Fractures)     Status: None   Collection Time: 12/05/18  8:10 AM  Result Value Ref Range   Vit D, 25-Hydroxy 35.1 30.0 - 100.0 ng/mL    Comment: Vitamin D deficiency has been defined by the [place] practice guideline as a level of serum 25-OH vitamin D less than 20 ng/mL (1,2). The Endocrine Society went on to further define vitamin D insufficiency as a level between 21 and 29 ng/mL (2). 1. IOM (Institute of Medicine). 2010. Dietary reference    intakes for calcium and D. [place]: The    [employer]. 2. Holick MF, Binkley [place], Bischoff-Ferrari HA, et al.    Evaluation, treatment, and prevention of vitamin D    deficiency: an Endocrine Society clinical practice    guideline. JCEM. 2011 Jul;  96(7):1911-30.   CBC with Differential/Platelet     Status: Abnormal   Collection Time: 01/18/19  3:33 PM  Result Value Ref Range   WBC 3.7 (L) 4.0 - 10.5 K/uL   RBC 4.07 3.87 - 5.11 Mil/uL   Hemoglobin 12.6 12.0 - 15.0 g/dL   HCT 38.4 36.0 - 46.0 %   MCV 94.4 78.0 - 100.0 fl   MCHC 33.0 30.0 - 36.0 g/dL   RDW 15.5 11.5 - 15.5 %   Platelets 219.0 150.0 - 400.0 K/uL   Neutrophils Relative % 52.9 43.0 - 77.0 %   Lymphocytes Relative 33.4 12.0 - 46.0 %   Monocytes Relative 9.7 3.0 - 12.0 %   Eosinophils Relative 2.8 0.0 - 5.0 %   Basophils Relative 1.2 0.0 - 3.0 %   Neutro Abs 2.0 1.4 - 7.7 K/uL   Lymphs Abs 1.2 0.7 - 4.0 K/uL   Monocytes Absolute 0.4 0.1 - 1.0 K/uL   Eosinophils Absolute 0.1 0.0 - 0.7 K/uL   Basophils Absolute 0.0 0.0 - 0.1 K/uL   Objective  Body mass index is 33.83 kg/m. Wt Readings from Last 3  Encounters:  01/18/19 200 lb 3.2 oz (90.8 kg)  01/05/19 198 lb 9.6 oz (90.1 kg)  12/20/18 206 lb (93.4 kg)   Temp Readings from Last 3 Encounters:  01/18/19 (!) 97 F (36.1 C) (Temporal)  01/05/19 97.8 F (36.6 C)  11/15/18 98.2 F (36.8 C)   BP Readings from Last 3 Encounters:  01/18/19 118/62  01/05/19 109/62  12/20/18 132/80   Pulse Readings from Last 3 Encounters:  01/18/19 71  01/05/19 67  11/15/18 94    Physical Exam Vitals signs and nursing note reviewed.  Constitutional:      Appearance: Normal appearance. She is well-developed and well-groomed. She is obese.     Comments: +mask on    HENT:     Head: Normocephalic and atraumatic.  Eyes:     Conjunctiva/sclera: Conjunctivae normal.     Pupils: Pupils are equal, round, and reactive to light.  Cardiovascular:     Rate and Rhythm: Normal rate and regular rhythm.     Heart sounds: Normal heart sounds. No murmur.  Pulmonary:     Effort: Pulmonary effort is normal.     Breath sounds: Normal breath sounds.  Abdominal:     General: Abdomen is flat. Bowel sounds are normal.     Tenderness: There is no abdominal tenderness.     Hernia: A hernia is present.     Comments: +perumbilical ab wall hernia no ttp    Musculoskeletal:       Arms:     Comments: Left wrist with 2-3 cm radial side likely ganglion cyst  Vs lipoma   Skin:    General: Skin is warm and dry.  Neurological:     General: No focal deficit present.     Mental Status: She is alert and oriented to person, place, and time. Mental status is at baseline.     Gait: Gait normal.  Psychiatric:        Attention and Perception: Attention and perception normal.        Mood and Affect: Mood and affect normal.        Speech: Speech normal.        Behavior: Behavior normal. Behavior is cooperative.        Thought Content: Thought content normal.        Cognition and Memory: Cognition and memory normal.  Judgment: Judgment normal.     Assessment    Plan  Periumbilical hernia complicated with lower abdominal pain at times  -refer to Dr. Johnathan Hausen in [place]  -reviewed red flags with pt to go to ED   Anemia, unspecified type - Plan: CBC with Differential/Platelet, CBC with Differential/Platelet  Hx of fibroids not worrisome on CT pelvis 05/12/18   Large paraesophageal/ hiatal hernia complicated with organs contained w/in hiatal hernia w/o sx's of GERD -pt wants to go back to [facility] but wants to speak with MD not NP/PA  Mass of right thigh resolved as of 01/18/2019 per pt   Mass of left wrist ganglion vs lipoma  -if continues to increase in size or sx'matic consider hand ortho in [place]  Goiter stable per Ct chest 12/2017  tsh normal 04/2018   Benign lung nodule  Ct 12/2017 noted benign etiology and stability   HM Flu shot high doseutd prevnar had 01/30/14 pna 23 given today 01/18/2019  zostavax had 01/30/14 -consider shingrix given Rx for this today 01/18/19  Tdap 01/30/14   DEXA 03/06/15 normal   mammo copy get from Dr. Leo [facility] -per pt she will f/u with Dr. Mancel Bale who ordered her mammogram -mammogram 06/20/18 normal   Pap out of age window  Colonoscopy 06/18/16 severe diverticulosis polyp x 1 tubular adenoma FH mom colon cancer f/u in 5 years   HCV neg 12/28/11  Eye exam as at f/u and consider if not had  Former smoker quit in 78s smoker x 20 years+ 2 ppd. CT 01/14/18 mild COPD (off Advair), stable goiter, mild CAD, stable left lung nodule lower lobe likely benign CT chest 12/2017 benign lung nodules noted   Down 70s + #s trying weight stable since 8/2076fu with [facility] Dr. BLeafy Ro congratulated   Cardiologist Dr. DLoralie Champagne [facility] 09/09/18 is pharmacy [facility]   Provider: Dr. TOlivia MackieMcLean-Scocuzza-Internal Medicine

## 2019-01-18 NOTE — Progress Notes (Signed)
She presents today chief complaint of painfully elongated toenails corns and calluses.  Objective: Vital signs are stable alert and oriented x3.  Pulses are palpable.  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  Assessment: Pain in limb secondary to plantar porokeratotic lesions and painful elongated toenails.  Plan: Debridement of all reactive hyperkeratotic tissue debridement of toenails 1 through 5.

## 2019-01-18 NOTE — Patient Instructions (Addendum)
Ganglion Cyst  A ganglion cyst is a non-cancerous, fluid-filled lump that occurs near a joint or tendon. The cyst grows out of a joint or the lining of a tendon. Ganglion cysts most often develop in the hand or wrist, but they can also develop in the shoulder, elbow, hip, knee, ankle, or foot. Ganglion cysts are ball-shaped or egg-shaped. Their size can range from the size of a pea to larger than a grape. Increased activity may cause the cyst to get bigger because more fluid starts to build up. What are the causes? The exact cause of this condition is not known, but it may be related to:  Inflammation or irritation around the joint.  An injury.  Repetitive movements or overuse.  Arthritis. What increases the risk? You are more likely to develop this condition if:  You are a woman.  You are 62-88 years old. What are the signs or symptoms? The main symptom of this condition is a lump. It most often appears on the hand or wrist. In many cases, there are no other symptoms, but a cyst can sometimes cause:  Tingling.  Pain.  Numbness.  Muscle weakness.  Weak grip.  Less range of motion in a joint. How is this diagnosed? Ganglion cysts are usually diagnosed based on a physical exam. Your health care provider will feel the lump and may shine a light next to it. If it is a ganglion cyst, the light will likely shine through it. Your health care provider may order an X-ray, ultrasound, or MRI to rule out other conditions. How is this treated? Ganglion cysts often go away on their own without treatment. If you have pain or other symptoms, treatment may be needed. Treatment is also needed if the ganglion cyst limits your movement or if it gets infected. Treatment may include:  Wearing a brace or splint on your wrist or finger.  Taking anti-inflammatory medicine.  Having fluid drained from the lump with a needle (aspiration).  Getting a steroid injected into the joint.  Having  surgery to remove the ganglion cyst.  Placing a pad on your shoe or wearing shoes that will not rub against the cyst if it is on your foot. Follow these instructions at home:  Do not press on the ganglion cyst, poke it with a needle, or hit it.  Take over-the-counter and prescription medicines only as told by your health care provider.  If you have a brace or splint: ? Wear it as told by your health care provider. ? Remove it as told by your health care provider. Ask if you need to remove it when you take a shower or a bath.  Watch your ganglion cyst for any changes.  Keep all follow-up visits as told by your health care provider. This is important. Contact a health care provider if:  Your ganglion cyst becomes larger or more painful.  You have pus coming from the lump.  You have weakness or numbness in the affected area.  You have a fever or chills. Get help right away if:  You have a fever and have any of these in the cyst area: ? Increased redness. ? Red streaks. ? Swelling. Summary  A ganglion cyst is a non-cancerous, fluid-filled lump that occurs near a joint or tendon.  Ganglion cysts most often develop in the hand or wrist, but they can also develop in the shoulder, elbow, hip, knee, ankle, or foot.  Ganglion cysts often go away on their own without treatment.  This information is not intended to replace advice given to you by your health care provider. Make sure you discuss any questions you have with your health care provider. Document Released: 02/14/2000 Document Revised: 01/29/2017 Document Reviewed: 10/16/2016 Elsevier Patient Education  [place].   Pneumococcal Vaccine, Polyvalent solution for injection What is this medicine? PNEUMOCOCCAL VACCINE, POLYVALENT (NEU mo KOK al vak SEEN, pol ee VEY luhnt) is a vaccine to prevent pneumococcus bacteria infection. These bacteria are a major cause of ear infections, Strep throat infections, and serious  pneumonia, meningitis, or blood infections worldwide. These vaccines help the body to produce antibodies (protective substances) that help your body defend against these bacteria. This vaccine is recommended for people 13 years of age and older with health problems. It is also recommended for all adults over 67 years old. This vaccine will not treat an infection. This medicine may be used for other purposes; ask your health care provider or pharmacist if you have questions. COMMON BRAND NAME(S): Pneumovax 23 What should I tell my health care provider before I take this medicine? They need to know if you have any of these conditions:  bleeding problems  bone marrow or organ transplant  cancer, Hodgkin's disease  fever  infection  immune system problems  low platelet count in the blood  seizures  an unusual or allergic reaction to pneumococcal vaccine, diphtheria toxoid, other vaccines, latex, other medicines, foods, dyes, or preservatives  pregnant or trying to get pregnant  breast-feeding How should I use this medicine? This vaccine is for injection into a muscle or under the skin. It is given by a health care professional. A copy of Vaccine Information Statements will be given before each vaccination. Read this sheet carefully each time. The sheet may change frequently. Talk to your pediatrician regarding the use of this medicine in children. While this drug may be prescribed for children as young as 43 years of age for selected conditions, precautions do apply. Overdosage: If you think you have taken too much of this medicine contact a poison control center or emergency room at once. NOTE: This medicine is only for you. Do not share this medicine with others. What if I miss a dose? It is important not to miss your dose. Call your doctor or health care professional if you are unable to keep an appointment. What may interact with this medicine?  medicines for cancer chemotherapy   medicines that suppress your immune function  medicines that treat or prevent blood clots like warfarin, enoxaparin, and dalteparin  steroid medicines like prednisone or cortisone This list may not describe all possible interactions. Give your health care provider a list of all the medicines, herbs, non-prescription drugs, or dietary supplements you use. Also tell them if you smoke, drink alcohol, or use illegal drugs. Some items may interact with your medicine. What should I watch for while using this medicine? Mild fever and pain should go away in 3 days or less. Report any unusual symptoms to your doctor or health care professional. What side effects may I notice from receiving this medicine? Side effects that you should report to your doctor or health care professional as soon as possible:  allergic reactions like skin rash, itching or hives, swelling of the face, lips, or tongue  breathing problems  confused  fever over 102 degrees F  pain, tingling, numbness in the hands or feet  seizures  unusual bleeding or bruising  unusual muscle weakness Side effects that usually do  not require medical attention (report to your doctor or health care professional if they continue or are bothersome):  aches and pains  diarrhea  fever of 102 degrees F or less  headache  irritable  loss of appetite  pain, tender at site where injected  trouble sleeping This list may not describe all possible side effects. Call your doctor for medical advice about side effects. You may report side effects to FDA at 1-800-FDA-1088. Where should I keep my medicine? This does not apply. This vaccine is given in a clinic, pharmacy, doctor's office, or other health care setting and will not be stored at home. NOTE: This sheet is a summary. It may not cover all possible information. If you have questions about this medicine, talk to your doctor, pharmacist, or health care provider.  2020 Elsevier/Gold  Standard (2007-09-23 14:32:37)

## 2019-01-19 LAB — CBC WITH DIFFERENTIAL/PLATELET
Basophils Absolute: 0 10*3/uL (ref 0.0–0.1)
Basophils Relative: 1.2 % (ref 0.0–3.0)
Eosinophils Absolute: 0.1 10*3/uL (ref 0.0–0.7)
Eosinophils Relative: 2.8 % (ref 0.0–5.0)
HCT: 38.4 % (ref 36.0–46.0)
Hemoglobin: 12.6 g/dL (ref 12.0–15.0)
Lymphocytes Relative: 33.4 % (ref 12.0–46.0)
Lymphs Abs: 1.2 10*3/uL (ref 0.7–4.0)
MCHC: 33 g/dL (ref 30.0–36.0)
MCV: 94.4 fl (ref 78.0–100.0)
Monocytes Absolute: 0.4 10*3/uL (ref 0.1–1.0)
Monocytes Relative: 9.7 % (ref 3.0–12.0)
Neutro Abs: 2 10*3/uL (ref 1.4–7.7)
Neutrophils Relative %: 52.9 % (ref 43.0–77.0)
Platelets: 219 10*3/uL (ref 150.0–400.0)
RBC: 4.07 Mil/uL (ref 3.87–5.11)
RDW: 15.5 % (ref 11.5–15.5)
WBC: 3.7 10*3/uL — ABNORMAL LOW (ref 4.0–10.5)

## 2019-01-23 ENCOUNTER — Encounter: Payer: Self-pay | Admitting: Internal Medicine

## 2019-01-23 ENCOUNTER — Telehealth: Payer: Self-pay | Admitting: Internal Medicine

## 2019-01-23 NOTE — Telephone Encounter (Signed)
Call  1. [facility]  and  2. [facility]  To cancel metformin 500 mg XR /discontinue medications   A1C 5.6 no longer needed    [place]

## 2019-01-24 NOTE — Telephone Encounter (Signed)
Both Pharmacies were already notified.

## 2019-01-31 ENCOUNTER — Ambulatory Visit (INDEPENDENT_AMBULATORY_CARE_PROVIDER_SITE_OTHER): Payer: Medicare Other | Admitting: Family Medicine

## 2019-01-31 ENCOUNTER — Other Ambulatory Visit: Payer: Self-pay

## 2019-01-31 ENCOUNTER — Encounter (INDEPENDENT_AMBULATORY_CARE_PROVIDER_SITE_OTHER): Payer: Self-pay | Admitting: Family Medicine

## 2019-01-31 VITALS — BP 108/67 | HR 76 | Temp 98.0°F | Ht 65.0 in | Wt 202.0 lb

## 2019-01-31 DIAGNOSIS — E669 Obesity, unspecified: Secondary | ICD-10-CM | POA: Diagnosis not present

## 2019-01-31 DIAGNOSIS — Z6833 Body mass index (BMI) 33.0-33.9, adult: Secondary | ICD-10-CM | POA: Diagnosis not present

## 2019-01-31 DIAGNOSIS — E7849 Other hyperlipidemia: Secondary | ICD-10-CM

## 2019-01-31 NOTE — Progress Notes (Signed)
Office: [phone]  /  Fax: [phone]   HPI:   Chief Complaint: OBESITY Heather [name] is here to discuss her progress with her obesity treatment plan. She is keeping a food journal with 1200 calories and 75+ grams of protein and is following her eating plan approximately 0% of the time. She states she is exercising on the elliptical 30 minutes 3 times per week. Heather [name] indulged over Thanksgiving. She notes her family, who has gained weight, is starting to act jealous and is sabotaging her more often. She isn't sure how to deal with this and how to get back on track. Her weight is 202 lb (91.6 kg) today and has had a weight gain of 7 lbs since her last visit. She has lost 66 lbs since starting treatment with [name].  Hyperlipidemia Heather [name] has hyperlipidemia and has been trying to improve her cholesterol levels with intensive lifestyle modification including a low saturated fat diet, exercise and weight loss. Last LDL was 122 on 04/27/2018. She is followed by her PCP. She denies any chest pain, claudication or myalgias.  ASSESSMENT AND PLAN:  Other hyperlipidemia  Class 1 obesity with serious comorbidity and body mass index (BMI) of 33.0 to 33.9 in adult, unspecified obesity type  PLAN:  Hyperlipidemia Heather [name] was informed of the American Heart Association Guidelines emphasizing intensive lifestyle modifications as the first line treatment for hyperlipidemia. We discussed many lifestyle modifications today in depth, and Heather [name] will continue to work on decreasing saturated fats such as fatty red meat, butter and many fried foods. Heather [name] was instructed to continue to decrease saturated fat in her diet. She will also increase vegetables and lean protein in her diet and continue to work on exercise and weight loss efforts.  I spent > than 50% of the 20 minute visit on counseling as documented in the note.  Obesity Heather [name] is currently in the action stage of change. As such, her goal is to continue with  weight loss efforts. She has agreed to change plans and will now follow a lower carbohydrate, vegetable and lean protein rich diet plan. Heather [name] has been instructed to work up to a goal of 150 minutes of combined cardio and strengthening exercise per week for weight loss and overall health benefits. We discussed the following Behavioral Modification Strategies today: work on meal planning and easy cooking plans, emotional eating strategies, dealing with family or coworker sabotage, and holiday eating strategies.  Heather [name] has agreed to follow-up with our clinic in 2 weeks for a TeleHealth visit and in 4 weeks for an in-office visit. She was informed of the importance of frequent follow-up visits to maximize her success with intensive lifestyle modifications for her multiple health conditions.  ALLERGIES: Allergies  Allergen Reactions  . Aspirin   . Coconut Oil   . Lisinopril     REACTION: cough  . Metoprolol Itching  . Peanut-Containing Drug Products   . Penicillins     REACTION: rash  . Prednisone   . Strawberry Extract     MEDICATIONS: Current Outpatient Medications on File Prior to Visit  Medication Sig Dispense Refill  . acetaminophen (TYLENOL) 500 MG tablet Take 650 mg by mouth 3 (three) times daily as needed for pain.     . Calcium Carbonate-Vitamin D (CALCIUM 600 + D PO) Take 2 tablets by mouth daily.      . cetirizine (ZYRTEC) 10 MG tablet Take 10 mg by mouth daily.    . clopidogrel (PLAVIX) 75 MG tablet Take 1 tablet (75  mg total) by mouth daily. 90 tablet 3  . Ferrous Sulfate (IRON) 325 (65 FE) MG TABS Take 1 tablet by mouth daily.      . furosemide (LASIX) 40 MG tablet Take 1 tablet (40 mg total) by mouth daily. 90 tablet 3  . gabapentin (NEURONTIN) 100 MG capsule Take 2 capsules (200 mg total) by mouth at bedtime. 180 capsule 3  . glucosamine-chondroitin 500-400 MG tablet Take 1 tablet by mouth 2 (two) times daily.      [name] ipratropium (ATROVENT) 0.06 % nasal spray Place 2  sprays into both nostrils 4 (four) times daily. 15 mL 12  . modafinil (PROVIGIL) 200 MG tablet modafinil 200 mg tablet    . Multiple Vitamin (MULTIVITAMIN) tablet Take 1 tablet by mouth daily.      . naproxen sodium (ANAPROX) 220 MG tablet Take 220 mg by mouth as needed.    . Omega-3 Fatty Acids (FISH OIL) 1000 MG CAPS Take 1 capsule by mouth daily.    . pantoprazole (PROTONIX) 20 MG tablet Take 1 tablet (20 mg total) by mouth daily. 30 minutes before food 30 tablet 11  . polyethylene glycol (MIRALAX / GLYCOLAX) 17 g packet Take 17 g by mouth daily as needed.    . potassium chloride SA (KLOR-CON M20) 20 MEQ tablet Take 1 tablet (20 mEq total) by mouth daily. 90 tablet 3  . Probiotic Product (PROBIOTIC DAILY PO) Take by mouth daily as needed.    . pyridOXINE (VITAMIN B-6) 100 MG tablet Take 200 mg by mouth daily.    . rosuvastatin (CRESTOR) 40 MG tablet Take 1 tablet (40 mg total) by mouth daily. At night 90 tablet 3  . SF 5000 PLUS 1.1 % CREA dental cream   0  . SUMAtriptan (IMITREX) 20 MG/ACT nasal spray sumatriptan 20 mg/actuation nasal spray    . topiramate (TOPAMAX) 100 MG tablet Take 1 tablet (100 mg total) by mouth 2 (two) times daily. 180 tablet 3  . Turmeric 500 MG TABS Take 2 tablets by mouth daily.    [name] venlafaxine XR (EFFEXOR XR) 37.5 MG 24 hr capsule Take 1 capsule (37.5 mg total) by mouth daily with breakfast. 90 capsule 1  . vitamin E 200 UNIT capsule Take 200 Units by mouth daily.     Current Facility-Administered Medications on File Prior to Visit  Medication Dose Route Frequency Provider Last Rate Last Dose  . 0.9 %  sodium chloride infusion  500 mL Intravenous Continuous Nandigam, Venia Minks, MD        PAST MEDICAL HISTORY: Past Medical History:  Diagnosis Date  . Abdominal hernia   . Abnormal Pap smear   . ALLERGIC RHINITIS 10/22/2006  . Allergy   . ANEMIA 12/18/2008  . Arthritis    scoliosis moderate deg changes lumbar Xray 11/04/17   . ASYMPTOMATIC POSTMENOPAUSAL  STATUS 11/22/2007  . Back pain   . CAD (coronary artery disease)    in LAD  . Complication of anesthesia   . Degenerative arthritis   . DIABETES MELLITUS, TYPE II 01/13/2007  . Diverticulosis   . Dyslipidemia   . Fibroid   . Frequent headaches   . GERD 07/20/2007  . GOITER, MULTINODULAR 07/20/2007  . Headache(784.0) 07/20/2007  . HEARING LOSS 11/22/2007  . Hiatal hernia   . Hiatal hernia    large  . History of chicken pox   . Hx of colposcopy with cervical biopsy   . HYPERCHOLESTEROLEMIA 01/13/2007  . Hyperglycemia   . HYPERTENSION  10/22/2006  . Lung nodule    unchanged since 05/26/13   . Migraines   . NASH (nonalcoholic steatohepatitis)   . Nocturnal hypoxemia 02/17/2013  . Obesity   . Obstructive sleep apnea   . OSA on CPAP    per neurology  . OSTEOARTHRITIS 10/22/2006  . Other chronic nonalcoholic liver disease 3/79/0240  . Sedimentation rate elevation   . Sleep apnea    on cpap  . Urine incontinence   . UTI (urinary tract infection)     PAST SURGICAL HISTORY: Past Surgical History:  Procedure Laterality Date  . BREAST SURGERY  1984   Breast reduction b/l   . CATARACT EXTRACTION, BILATERAL    . DEXA  08/2005  . DILATION AND CURETTAGE OF UTERUS    . ELECTROCARDIOGRAM  10/15/2006  . ESOPHAGOGASTRODUODENOSCOPY  12/08/2005  . FOOT SURGERY     hammertoe and bunion 05/2017   . Stress Cardiolite  10/21/2005  . sweat gland removal    . WISDOM TOOTH EXTRACTION      SOCIAL HISTORY: Social History   Tobacco Use  . Smoking status: Former Smoker    Packs/day: 2.00    Years: 20.00    Pack years: 40.00    Types: Cigarettes    Quit date: 03/03/1979    Years since quitting: 39.9  . Smokeless tobacco: Never Used  Substance Use Topics  . Alcohol use: Yes    Comment: rare  . Drug use: No    FAMILY HISTORY: Family History  Problem Relation Age of Onset  . Asthma Mother   . Depression Mother   . Bipolar disorder Mother   . Dementia Mother   . Arthritis Mother   .  Breast cancer Mother        primary  . Colon cancer Mother        mets from breast  . Cancer Mother        colon  . Hypertension Father   . Migraines Father   . Cancer Maternal Aunt        ?  [name] Heart disease Maternal Grandmother   . Cancer Maternal Grandfather        stomach ?   ROS: Review of Systems  Cardiovascular: Negative for chest pain and claudication.  Musculoskeletal: Negative for myalgias.   PHYSICAL EXAM: Blood pressure 108/67, pulse 76, temperature 98 F (36.7 C), temperature source Oral, height 5' 5"  (1.651 m), weight 202 lb (91.6 kg), SpO2 100 %. Body mass index is 33.61 kg/m. Physical Exam Vitals signs reviewed.  Constitutional:      Appearance: Normal appearance. She is obese.  Cardiovascular:     Rate and Rhythm: Normal rate.     Pulses: Normal pulses.  Pulmonary:     Effort: Pulmonary effort is normal.     Breath sounds: Normal breath sounds.  Musculoskeletal: Normal range of motion.  Skin:    General: Skin is warm and dry.  Neurological:     Mental Status: She is alert and oriented to person, place, and time.  Psychiatric:        Behavior: Behavior normal.   RECENT LABS AND TESTS: BMET    Component Value Date/Time   NA 141 12/05/2018 0810   K 4.1 12/05/2018 0810   CL 106 12/05/2018 0810   CO2 25 12/05/2018 0810   GLUCOSE 85 12/05/2018 0810   GLUCOSE 116 (H) 03/05/2016 0958   BUN 22 12/05/2018 0810   CREATININE 0.84 12/05/2018 0810   CREATININE 0.81 12/28/2011  0853   CALCIUM 9.5 12/05/2018 0810   GFRNONAA 70 12/05/2018 0810   GFRAA 80 12/05/2018 0810   Lab Results  Component Value Date   HGBA1C 5.6 12/05/2018   HGBA1C 5.6 04/27/2018   HGBA1C 6.0 (H) 12/01/2017   HGBA1C 5.7 (H) 07/29/2017   HGBA1C 5.9 03/15/2017   Lab Results  Component Value Date   INSULIN 6.4 12/05/2018   INSULIN 3.6 04/27/2018   INSULIN 5.6 12/01/2017   INSULIN 6.8 07/29/2017   INSULIN 20.9 12/23/2016   CBC    Component Value Date/Time   WBC 3.7 (L)  01/18/2019 1533   RBC 4.07 01/18/2019 1533   HGB 12.6 01/18/2019 1533   HGB 12.4 12/01/2017 1146   HCT 38.4 01/18/2019 1533   HCT 39.5 12/01/2017 1146   PLT 219.0 01/18/2019 1533   MCV 94.4 01/18/2019 1533   MCV 91 12/01/2017 1146   MCH 28.5 12/01/2017 1146   MCH 29.4 12/28/2011 0853   MCHC 33.0 01/18/2019 1533   RDW 15.5 01/18/2019 1533   RDW 16.2 (H) 12/01/2017 1146   LYMPHSABS 1.2 01/18/2019 1533   LYMPHSABS 1.2 12/01/2017 1146   MONOABS 0.4 01/18/2019 1533   EOSABS 0.1 01/18/2019 1533   EOSABS 0.1 12/01/2017 1146   BASOSABS 0.0 01/18/2019 1533   BASOSABS 0.0 12/01/2017 1146   Iron/TIBC/Ferritin/ %Sat    Component Value Date/Time   IRON 43 (L) 04/26/2018 1602   IRON 38 12/23/2016 0956   TIBC 249 (L) 04/26/2018 1602   TIBC 271 12/23/2016 0956   FERRITIN 60 04/26/2018 1602   FERRITIN 40 12/23/2016 0956   IRONPCTSAT 17 04/26/2018 1602   Lipid Panel     Component Value Date/Time   CHOL 178 12/05/2018 0810   TRIG 65 12/05/2018 0810   HDL 77 12/05/2018 0810   CHOLHDL 3 03/05/2016 0958   VLDL 26.0 03/05/2016 0958   LDLCALC 89 12/05/2018 0810   Hepatic Function Panel     Component Value Date/Time   PROT 6.6 12/05/2018 0810   ALBUMIN 3.9 12/05/2018 0810   AST 19 12/05/2018 0810   ALT 14 12/05/2018 0810   ALKPHOS 65 12/05/2018 0810   BILITOT <0.2 12/05/2018 0810   BILIDIR 0.1 02/15/2015 1010   IBILI 0.2 12/28/2011 0853      Component Value Date/Time   TSH 1.41 04/26/2018 1602   TSH 1.170 12/23/2016 0956   TSH 1.76 03/05/2016 0958   Results for TERAH, ROBEY (MRN [id number]) as of 01/31/2019 15:22  Ref. Range 12/05/2018 08:10  Vitamin D, 25-Hydroxy Latest Ref Range: 30.0 - 100.0 ng/mL 35.1   OBESITY BEHAVIORAL INTERVENTION VISIT  Today's visit was #40  Starting weight: 268 lbs Starting date: 12/23/2016 Today's weight: 202 lbs  Today's date: 01/31/2019 Total lbs lost to date: 66     01/31/2019  Height 5' 5"  (1.651 m)  Weight 202 lb (91.6 kg)  BMI  (Calculated) 33.61  BLOOD PRESSURE - SYSTOLIC 440  BLOOD PRESSURE - DIASTOLIC 67   Body Fat % 10.2 %  Total Body Water (lbs) 85.4 lbs   ASK: We discussed the diagnosis of obesity with Cherlynn June today and Solash agreed to give [name] permission to discuss obesity behavioral modification therapy today.  ASSESS: Morayma has the diagnosis of obesity and her BMI today is 33.7. Collette is in the action stage of change.   ADVISE: Jan was educated on the multiple health risks of obesity as well as the benefit of weight loss to improve her health. She was  advised of the need for long term treatment and the importance of lifestyle modifications to improve her current health and to decrease her risk of future health problems.  AGREE: Multiple dietary modification options and treatment options were discussed and  Christie agreed to follow the recommendations documented in the above note.  ARRANGE: Althia was educated on the importance of frequent visits to treat obesity as outlined per CMS and USPSTF guidelines and agreed to schedule her next follow up appointment today.  I, Michaelene Song, am acting as Location manager for [name], MD I have reviewed the above documentation for accuracy and completeness, and I agree with the above. -[name], MD

## 2019-02-13 ENCOUNTER — Encounter: Payer: Self-pay | Admitting: Internal Medicine

## 2019-02-14 ENCOUNTER — Other Ambulatory Visit: Payer: Self-pay | Admitting: Internal Medicine

## 2019-02-14 DIAGNOSIS — K219 Gastro-esophageal reflux disease without esophagitis: Secondary | ICD-10-CM

## 2019-02-14 MED ORDER — PANTOPRAZOLE SODIUM 20 MG PO TBEC: 20.0000 mg | DELAYED_RELEASE_TABLET | Freq: Every day | ORAL | 3 refills | Status: DC

## 2019-02-15 ENCOUNTER — Encounter (INDEPENDENT_AMBULATORY_CARE_PROVIDER_SITE_OTHER): Payer: Self-pay | Admitting: Family Medicine

## 2019-02-15 ENCOUNTER — Telehealth (INDEPENDENT_AMBULATORY_CARE_PROVIDER_SITE_OTHER): Payer: Medicare Other | Admitting: Family Medicine

## 2019-02-15 ENCOUNTER — Other Ambulatory Visit: Payer: Self-pay

## 2019-02-15 DIAGNOSIS — E669 Obesity, unspecified: Secondary | ICD-10-CM | POA: Diagnosis not present

## 2019-02-15 DIAGNOSIS — K5909 Other constipation: Secondary | ICD-10-CM

## 2019-02-15 DIAGNOSIS — Z6833 Body mass index (BMI) 33.0-33.9, adult: Secondary | ICD-10-CM

## 2019-02-15 DIAGNOSIS — Z7189 Other specified counseling: Secondary | ICD-10-CM | POA: Diagnosis not present

## 2019-02-15 DIAGNOSIS — E66811 Obesity, class 1: Secondary | ICD-10-CM

## 2019-02-18 ENCOUNTER — Other Ambulatory Visit (HOSPITAL_COMMUNITY): Payer: Self-pay | Admitting: Cardiology

## 2019-02-20 NOTE — Progress Notes (Signed)
Office: [phone]  /  Fax: [phone] TeleHealth Visit:  Heather [name] has verbally consented to this TeleHealth visit today. The patient is located at home, the provider is located at the [facility] office. The participants in this visit include the listed provider and patient. The visit was conducted today via telephone call (AV failed - changed to telephone call).  HPI:  Chief Complaint: OBESITY Heather [name] is here to discuss her progress with her obesity treatment plan. She is following a lower carbohydrate, vegetable and lean protein rich diet plan and states she is following her eating plan approximately 50% of the time. She states she is exercising on the elliptical 30 minutes 3-4 times per week.  Heather [name] feels she has done well mostly maintaining her weight since her last visit. She found some 5 calorie ketchup, which she is using while doing a low carb plan. Her family is in town and she plans on doing some celebration eating.   Starting weight: 268 lbs Starting date: 12/23/2016   Constipation Heather [name] has constipation and has been taking MiraLax PRN. She also feels more bloating and swelling.  Advice Given About COVID-19 We discussed with Heather [name] holiday plans and how to socially distance with family and friends.  ASSESSMENT AND PLAN:  Other constipation  Advice given about COVID-19 virus infection  Class 1 obesity with serious comorbidity and body mass index (BMI) of 33.0 to 33.9 in adult, unspecified obesity type  PLAN:  Constipation Heather [name] will increase her MiraLax 17 grams OTC for now. We will follow.  Advice Given About COVID-19 We discussed the importance of isolation from people who are traveling for the holidays after they return from traveling.  I spent > than 50% of the 21 minute visit on counseling as documented in the note.    Obesity Heather [name] is currently in the action stage of change. As such, her goal is to continue with weight loss  efforts. She has agreed to portion control better and make smarter food choices, such as increase vegetables and decrease simple carbohydrates.She was encouraged to portion control and make sure to increase vegetables and lean protein.  Heather [name] has been instructed to work up to a goal of 150 minutes of combined cardio and strengthening exercise per week for weight loss and overall health benefits. We discussed the following Behavioral Modification Strategies today: holiday eating strategies and celebration eating strategies.  Heather [name] has agreed to follow-up with our clinic in 3-4 weeks. She was informed of the importance of frequent follow-up visits to maximize her success with intensive lifestyle modifications for her multiple health conditions.  ALLERGIES: Allergies  Allergen Reactions  . Aspirin   . Coconut Oil   . Lisinopril     REACTION: cough  . Metoprolol Itching  . Peanut-Containing Drug Products   . Penicillins     REACTION: rash  . Prednisone   . Strawberry Extract     MEDICATIONS: Current Outpatient Medications on File Prior to Visit  Medication Sig Dispense Refill  . acetaminophen (TYLENOL) 500 MG tablet Take 650 mg by mouth 3 (three) times daily as needed for pain.     . Calcium Carbonate-Vitamin D (CALCIUM 600 + D PO) Take 2 tablets by mouth daily.      . cetirizine (ZYRTEC) 10 MG tablet Take 10 mg by mouth daily.    . Ferrous Sulfate (IRON) 325 (65 FE) MG TABS Take 1 tablet by mouth daily.      [name] gabapentin (NEURONTIN) 100 MG  capsule Take 2 capsules (200 mg total) by mouth at bedtime. 180 capsule 3  . glucosamine-chondroitin 500-400 MG tablet Take 1 tablet by mouth 2 (two) times daily.      [name] ipratropium (ATROVENT) 0.06 % nasal spray Place 2 sprays into both nostrils 4 (four) times daily. 15 mL 12  . modafinil (PROVIGIL) 200 MG tablet modafinil 200 mg tablet    . Multiple Vitamin (MULTIVITAMIN) tablet Take 1 tablet by mouth daily.      . naproxen sodium (ANAPROX) 220 MG  tablet Take 220 mg by mouth as needed.    . Omega-3 Fatty Acids (FISH OIL) 1000 MG CAPS Take 1 capsule by mouth daily.    . pantoprazole (PROTONIX) 20 MG tablet Take 1 tablet (20 mg total) by mouth daily. 30 minutes before food 90 tablet 3  . polyethylene glycol (MIRALAX / GLYCOLAX) 17 g packet Take 17 g by mouth daily as needed.    . potassium chloride SA (KLOR-CON M20) 20 MEQ tablet Take 1 tablet (20 mEq total) by mouth daily. 90 tablet 3  . pyridOXINE (VITAMIN B-6) 100 MG tablet Take 200 mg by mouth daily.    . SF 5000 PLUS 1.1 % CREA dental cream   0  . SUMAtriptan (IMITREX) 20 MG/ACT nasal spray sumatriptan 20 mg/actuation nasal spray    . topiramate (TOPAMAX) 100 MG tablet Take 1 tablet (100 mg total) by mouth 2 (two) times daily. 180 tablet 3  . Turmeric 500 MG TABS Take 2 tablets by mouth daily.    [name] venlafaxine XR (EFFEXOR XR) 37.5 MG 24 hr capsule Take 1 capsule (37.5 mg total) by mouth daily with breakfast. 90 capsule 1  . vitamin E 200 UNIT capsule Take 200 Units by mouth daily.     Current Facility-Administered Medications on File Prior to Visit  Medication Dose Route Frequency Provider Last Rate Last Admin  . 0.9 %  sodium chloride infusion  500 mL Intravenous Continuous Nandigam, Venia Minks, MD        PAST MEDICAL HISTORY: Past Medical History:  Diagnosis Date  . Abdominal hernia   . Abnormal Pap smear   . ALLERGIC RHINITIS 10/22/2006  . Allergy   . ANEMIA 12/18/2008  . Arthritis    scoliosis moderate deg changes lumbar Xray 11/04/17   . ASYMPTOMATIC POSTMENOPAUSAL STATUS 11/22/2007  . Back pain   . CAD (coronary artery disease)    in LAD  . Complication of anesthesia   . Degenerative arthritis   . DIABETES MELLITUS, TYPE II 01/13/2007  . Diverticulosis   . Dyslipidemia   . Fibroid   . Frequent headaches   . GERD 07/20/2007  . GOITER, MULTINODULAR 07/20/2007  . Headache(784.0) 07/20/2007  . HEARING LOSS 11/22/2007  . Hiatal hernia   . Hiatal hernia    large  .  History of chicken pox   . Hx of colposcopy with cervical biopsy   . HYPERCHOLESTEROLEMIA 01/13/2007  . Hyperglycemia   . HYPERTENSION 10/22/2006  . Lung nodule    unchanged since 05/26/13   . Migraines   . NASH (nonalcoholic steatohepatitis)   . Nocturnal hypoxemia 02/17/2013  . Obesity   . Obstructive sleep apnea   . OSA on CPAP    per neurology  . OSTEOARTHRITIS 10/22/2006  . Other chronic nonalcoholic liver disease 2/62/0355  . Sedimentation rate elevation   . Sleep apnea    on cpap  . Urine incontinence   . UTI (urinary tract infection)  PAST SURGICAL HISTORY: Past Surgical History:  Procedure Laterality Date  . BREAST SURGERY  1984   Breast reduction b/l   . CATARACT EXTRACTION, BILATERAL    . DEXA  08/2005  . DILATION AND CURETTAGE OF UTERUS    . ELECTROCARDIOGRAM  10/15/2006  . ESOPHAGOGASTRODUODENOSCOPY  12/08/2005  . FOOT SURGERY     hammertoe and bunion 05/2017   . Stress Cardiolite  10/21/2005  . sweat gland removal    . WISDOM TOOTH EXTRACTION      SOCIAL HISTORY: Social History   Tobacco Use  . Smoking status: Former Smoker    Packs/day: 2.00    Years: 20.00    Pack years: 40.00    Types: Cigarettes    Quit date: 03/03/1979    Years since quitting: 40.0  . Smokeless tobacco: Never Used  Substance Use Topics  . Alcohol use: Yes    Comment: rare  . Drug use: No    FAMILY HISTORY: Family History  Problem Relation Age of Onset  . Asthma Mother   . Depression Mother   . Bipolar disorder Mother   . Dementia Mother   . Arthritis Mother   . Breast cancer Mother        primary  . Colon cancer Mother        mets from breast  . Cancer Mother        colon  . Hypertension Father   . Migraines Father   . Cancer Maternal Aunt        ?  [name] Heart disease Maternal Grandmother   . Cancer Maternal Grandfather        stomach ?   ROS: Review of Systems  Gastrointestinal: Positive for constipation.   PHYSICAL EXAM: There were no vitals taken for  this visit. There is no height or weight on file to calculate BMI. Physical Exam: Pt in no acute distress.  RECENT LABS AND TESTS: BMET    Component Value Date/Time   NA 141 12/05/2018 0810   K 4.1 12/05/2018 0810   CL 106 12/05/2018 0810   CO2 25 12/05/2018 0810   GLUCOSE 85 12/05/2018 0810   GLUCOSE 116 (H) 03/05/2016 0958   BUN 22 12/05/2018 0810   CREATININE 0.84 12/05/2018 0810   CREATININE 0.81 12/28/2011 0853   CALCIUM 9.5 12/05/2018 0810   GFRNONAA 70 12/05/2018 0810   GFRAA 80 12/05/2018 0810   Lab Results  Component Value Date   HGBA1C 5.6 12/05/2018   HGBA1C 5.6 04/27/2018   HGBA1C 6.0 (H) 12/01/2017   HGBA1C 5.7 (H) 07/29/2017   HGBA1C 5.9 03/15/2017   Lab Results  Component Value Date   INSULIN 6.4 12/05/2018   INSULIN 3.6 04/27/2018   INSULIN 5.6 12/01/2017   INSULIN 6.8 07/29/2017   INSULIN 20.9 12/23/2016   CBC    Component Value Date/Time   WBC 3.7 (L) 01/18/2019 1533   RBC 4.07 01/18/2019 1533   HGB 12.6 01/18/2019 1533   HGB 12.4 12/01/2017 1146   HCT 38.4 01/18/2019 1533   HCT 39.5 12/01/2017 1146   PLT 219.0 01/18/2019 1533   MCV 94.4 01/18/2019 1533   MCV 91 12/01/2017 1146   MCH 28.5 12/01/2017 1146   MCH 29.4 12/28/2011 0853   MCHC 33.0 01/18/2019 1533   RDW 15.5 01/18/2019 1533   RDW 16.2 (H) 12/01/2017 1146   LYMPHSABS 1.2 01/18/2019 1533   LYMPHSABS 1.2 12/01/2017 1146   MONOABS 0.4 01/18/2019 1533   EOSABS 0.1 01/18/2019 1533  EOSABS 0.1 12/01/2017 1146   BASOSABS 0.0 01/18/2019 1533   BASOSABS 0.0 12/01/2017 1146   Iron/TIBC/Ferritin/ %Sat    Component Value Date/Time   IRON 43 (L) 04/26/2018 1602   IRON 38 12/23/2016 0956   TIBC 249 (L) 04/26/2018 1602   TIBC 271 12/23/2016 0956   FERRITIN 60 04/26/2018 1602   FERRITIN 40 12/23/2016 0956   IRONPCTSAT 17 04/26/2018 1602   Lipid Panel     Component Value Date/Time   CHOL 178 12/05/2018 0810   TRIG 65 12/05/2018 0810   HDL 77 12/05/2018 0810   CHOLHDL 3  03/05/2016 0958   VLDL 26.0 03/05/2016 0958   LDLCALC 89 12/05/2018 0810   Hepatic Function Panel     Component Value Date/Time   PROT 6.6 12/05/2018 0810   ALBUMIN 3.9 12/05/2018 0810   AST 19 12/05/2018 0810   ALT 14 12/05/2018 0810   ALKPHOS 65 12/05/2018 0810   BILITOT <0.2 12/05/2018 0810   BILIDIR 0.1 02/15/2015 1010   IBILI 0.2 12/28/2011 0853      Component Value Date/Time   TSH 1.41 04/26/2018 1602   TSH 1.170 12/23/2016 0956   TSH 1.76 03/05/2016 0958      I, Michaelene Song, am acting as Location manager for [name], MD I have reviewed the above documentation for accuracy and completeness, and I agree with the above. -[name], MD

## 2019-02-21 ENCOUNTER — Encounter: Payer: Self-pay | Admitting: Family Medicine

## 2019-02-21 ENCOUNTER — Other Ambulatory Visit: Payer: Self-pay

## 2019-02-21 ENCOUNTER — Other Ambulatory Visit: Payer: Self-pay | Admitting: Internal Medicine

## 2019-02-21 ENCOUNTER — Ambulatory Visit (INDEPENDENT_AMBULATORY_CARE_PROVIDER_SITE_OTHER): Payer: Medicare Other | Admitting: Family Medicine

## 2019-02-21 DIAGNOSIS — M170 Bilateral primary osteoarthritis of knee: Secondary | ICD-10-CM | POA: Diagnosis not present

## 2019-02-21 DIAGNOSIS — R609 Edema, unspecified: Secondary | ICD-10-CM

## 2019-02-21 DIAGNOSIS — E119 Type 2 diabetes mellitus without complications: Secondary | ICD-10-CM

## 2019-02-21 MED ORDER — ROSUVASTATIN CALCIUM 40 MG PO TABS: 40.0000 mg | ORAL_TABLET | Freq: Every day | ORAL | 3 refills | Status: DC

## 2019-02-21 MED ORDER — FUROSEMIDE 40 MG PO TABS: 40.0000 mg | ORAL_TABLET | Freq: Every day | ORAL | 3 refills | Status: DC | PRN

## 2019-02-21 NOTE — Patient Instructions (Signed)
Happy Holidays See me again in 10 weeks

## 2019-02-21 NOTE — Assessment & Plan Note (Signed)
Bilateral injections given, tolerated the procedure well, discussed icing regimen and home exercises, topical anti-inflammatories as needed.  Encourage patient to continue with weight loss.  Follow-up again in 4 to 8 weeks

## 2019-02-21 NOTE — Progress Notes (Signed)
[name] [place], [place] [address] Phone: [phone] Subjective:   [name], am serving as a scribe for Dr. Hulan Saas. This visit occurred during the SARS-CoV-2 public health emergency.  Safety protocols were in place, including screening questions prior to the visit, additional usage of staff PPE, and extensive cleaning of exam room while observing appropriate contact time as indicated for disinfecting solutions.      CC: Bilateral knee pain follow-up  CLE:[id number]   12/20/2018 Repeat injection given today.  Tolerated the procedure well.  Discussed icing regimen and home exercise, what activities to do which wants to avoid.  Patient is to increase activity as tolerated.  Follow-up again in 4 to 8 weeks.  Update 02/21/2019 Heather [name] is a 73 y.o. female coming in with complaint of bilateral knee pain. Patient states that she is not having much pain in the knee joint. She complains of more of an instability on left side. Notes some swelling of left leg. Has been using compression hose which has helped to control the swelling. States that water pill did not help the swelling at all. Is experiencing cramping of her feet. Having a hard time climbing stairs.      Past Medical History:  Diagnosis Date  . Abdominal hernia   . Abnormal Pap smear   . ALLERGIC RHINITIS 10/22/2006  . Allergy   . ANEMIA 12/18/2008  . Arthritis    scoliosis moderate deg changes lumbar Xray 11/04/17   . ASYMPTOMATIC POSTMENOPAUSAL STATUS 11/22/2007  . Back pain   . CAD (coronary artery disease)    in LAD  . Complication of anesthesia   . Degenerative arthritis   . DIABETES MELLITUS, TYPE II 01/13/2007  . Diverticulosis   . Dyslipidemia   . Fibroid   . Frequent headaches   . GERD 07/20/2007  . GOITER, MULTINODULAR 07/20/2007  . Headache(784.0) 07/20/2007  . HEARING LOSS 11/22/2007  . Hiatal hernia   . Hiatal hernia    large  . History of  chicken pox   . Hx of colposcopy with cervical biopsy   . HYPERCHOLESTEROLEMIA 01/13/2007  . Hyperglycemia   . HYPERTENSION 10/22/2006  . Lung nodule    unchanged since 05/26/13   . Migraines   . NASH (nonalcoholic steatohepatitis)   . Nocturnal hypoxemia 02/17/2013  . Obesity   . Obstructive sleep apnea   . OSA on CPAP    per neurology  . OSTEOARTHRITIS 10/22/2006  . Other chronic nonalcoholic liver disease 4/49/6759  . Sedimentation rate elevation   . Sleep apnea    on cpap  . Urine incontinence   . UTI (urinary tract infection)    Past Surgical History:  Procedure Laterality Date  . BREAST SURGERY  1984   Breast reduction b/l   . CATARACT EXTRACTION, BILATERAL    . DEXA  08/2005  . DILATION AND CURETTAGE OF UTERUS    . ELECTROCARDIOGRAM  10/15/2006  . ESOPHAGOGASTRODUODENOSCOPY  12/08/2005  . FOOT SURGERY     hammertoe and bunion 05/2017   . Stress Cardiolite  10/21/2005  . sweat gland removal    . WISDOM TOOTH EXTRACTION     Social History   Socioeconomic History  . Marital status: Married    Spouse name: [name]  . Number of children: 2  . Years of education: College  . Highest education level: Not on file  Occupational History  . Occupation: Retired  Tobacco Use  . Smoking  status: Former Smoker    Packs/day: 2.00    Years: 20.00    Pack years: 40.00    Types: Cigarettes    Quit date: 03/03/1979    Years since quitting: 40.0  . Smokeless tobacco: Never Used  Substance and Sexual Activity  . Alcohol use: Yes    Comment: rare  . Drug use: No  . Sexual activity: Yes    Birth control/protection: Surgical  Other Topics Concern  . Not on file  Social History Narrative   Patient is married [name]).   Patient has two children; 3 pregnancies 2 live births    Patient is retired Designer, jewellery, Chiropodist    Patient has a Financial risk analyst.   Patient is right-handed.   Patient lives at home with family.   Caffeine  Use: 2 soda every other day   Former smoker 20+ years ago 2 ppd quit in 63s   No guns, wears seat belt, safe in relationship    Social Determinants of Health   Financial Resource Strain: Low Risk   . Difficulty of Paying Living Expenses: Not hard at all  Food Insecurity: No Food Insecurity  . Worried About Charity fundraiser in the Last Year: Never true  . Ran Out of Food in the Last Year: Never true  Transportation Needs: No Transportation Needs  . Lack of Transportation (Medical): No  . Lack of Transportation (Non-Medical): No  Physical Activity: Sufficiently Active  . Days of Exercise per Week: 4 days  . Minutes of Exercise per Session: 60 min  Stress: No Stress Concern Present  . Feeling of Stress : Not at all  Social Connections:   . Frequency of Communication with Friends and Family: Not on file  . Frequency of Social Gatherings with Friends and Family: Not on file  . Attends Religious Services: Not on file  . Active Member of Clubs or Organizations: Not on file  . Attends Archivist Meetings: Not on file  . Marital Status: Not on file   Allergies  Allergen Reactions  . Aspirin   . Coconut Oil   . Lisinopril     REACTION: cough  . Metoprolol Itching  . Peanut-Containing Drug Products   . Penicillins     REACTION: rash  . Prednisone   . Strawberry Extract    Family History  Problem Relation Age of Onset  . Asthma Mother   . Depression Mother   . Bipolar disorder Mother   . Dementia Mother   . Arthritis Mother   . Breast cancer Mother        primary  . Colon cancer Mother        mets from breast  . Cancer Mother        colon  . Hypertension Father   . Migraines Father   . Cancer Maternal Aunt        ?  [name] Heart disease Maternal Grandmother   . Cancer Maternal Grandfather        stomach ?      Current Outpatient Medications (Cardiovascular):  .  furosemide (LASIX) 40 MG tablet, Take 1 tablet (40 mg total) by mouth daily as needed. .   rosuvastatin (CRESTOR) 40 MG tablet, Take 1 tablet (40 mg total) by mouth daily. At night   Current Outpatient Medications (Respiratory):  .  cetirizine (ZYRTEC) 10 MG tablet, Take 10 mg by mouth daily. [name]  ipratropium (ATROVENT) 0.06 % nasal spray, Place  2 sprays into both nostrils 4 (four) times daily.   Current Outpatient Medications (Analgesics):  .  acetaminophen (TYLENOL) 500 MG tablet, Take 650 mg by mouth 3 (three) times daily as needed for pain.  .  naproxen sodium (ANAPROX) 220 MG tablet, Take 220 mg by mouth as needed. .  SUMAtriptan (IMITREX) 20 MG/ACT nasal spray, sumatriptan 20 mg/actuation nasal spray   Current Outpatient Medications (Hematological):  .  clopidogrel (PLAVIX) 75 MG tablet, TAKE 1 TABLET DAILY .  Ferrous Sulfate (IRON) 325 (65 FE) MG TABS, Take 1 tablet by mouth daily.     Current Outpatient Medications (Other):  [name]  Calcium Carbonate-Vitamin D (CALCIUM 600 + D PO), Take 2 tablets by mouth daily.   [name]  gabapentin (NEURONTIN) 100 MG capsule, Take 2 capsules (200 mg total) by mouth at bedtime. [name]  glucosamine-chondroitin 500-400 MG tablet, Take 1 tablet by mouth 2 (two) times daily.   .  modafinil (PROVIGIL) 200 MG tablet, modafinil 200 mg tablet .  Multiple Vitamin (MULTIVITAMIN) tablet, Take 1 tablet by mouth daily.   .  Omega-3 Fatty Acids (FISH OIL) 1000 MG CAPS, Take 1 capsule by mouth daily. .  pantoprazole (PROTONIX) 20 MG tablet, Take 1 tablet (20 mg total) by mouth daily. 30 minutes before food .  polyethylene glycol (MIRALAX / GLYCOLAX) 17 g packet, Take 17 g by mouth daily as needed. .  potassium chloride SA (KLOR-CON M20) 20 MEQ tablet, Take 1 tablet (20 mEq total) by mouth daily. [name]  pyridOXINE (VITAMIN B-6) 100 MG tablet, Take 200 mg by mouth daily. .  SF 5000 PLUS 1.1 % CREA dental cream,  .  topiramate (TOPAMAX) 100 MG tablet, Take 1 tablet (100 mg total) by mouth 2 (two) times daily. .  Turmeric 500 MG TABS, Take 2 tablets by mouth daily. [name]   venlafaxine XR (EFFEXOR XR) 37.5 MG 24 hr capsule, Take 1 capsule (37.5 mg total) by mouth daily with breakfast. .  vitamin E 200 UNIT capsule, Take 200 Units by mouth daily.  Current Facility-Administered Medications (Other):  .  0.9 %  sodium chloride infusion    Past medical history, social, surgical and family history all reviewed in electronic medical record.  No pertanent information unless stated regarding to the chief complaint.   Review of Systems:  No headache, visual changes, nausea, vomiting, diarrhea, constipation, dizziness, abdominal pain, skin rash, fevers, chills, night sweats, weight loss, swollen lymph nodes, body aches, joint swelling,  chest pain, shortness of breath, mood changes.  Positive muscle aches  Objective  Blood pressure 118/82, height 5' 5"  (1.651 m), weight 202 lb (91.6 kg).    General: No apparent distress alert and oriented x3 mood and affect normal, dressed appropriately.  HEENT: Pupils equal, extraocular movements intact  Respiratory: Patient's speak in full sentences and does not appear short of breath  Cardiovascular: Trace lower extremity edema, non tender, no erythema  Skin: Warm dry intact with no signs of infection or rash on extremities or on axial skeleton.  Abdomen: Soft nontender  Neuro: Cranial nerves II through XII are intact, neurovascularly intact in all extremities with 2+ DTRs and 2+ pulses.  Lymph: No lymphadenopathy of posterior or anterior cervical chain or axillae bilaterally.  Gait n antalgic MSK:   Patient does have pain still noted with the knee bilaterally.  Significant valgus deformity noted.  Patient does have pain on the patellofemoral as well as the medial joint space.  Patient lacks last 2 degrees of extension  and lacks 5 degrees of flexion.  After informed written and verbal consent, patient was seated on exam table. Right knee was prepped with alcohol swab and utilizing anterolateral approach, patient's right knee space  was injected with 4:1  marcaine 0.5%: Kenalog 47m/dL. Patient tolerated the procedure well without immediate complications.  After informed written and verbal consent, patient was seated on exam table. Left knee was prepped with alcohol swab and utilizing anterolateral approach, patient's left knee space was injected with 4:1  marcaine 0.5%: Kenalog 448mdL. Patient tolerated the procedure well without immediate complications.    Impression and Recommendations:     This case required medical decision making of moderate complexity. The above documentation has been reviewed and is accurate and complete ZaLyndal PulleyDO       Note: This dictation was prepared with Dragon dictation along with smaller phrase technology. Any transcriptional errors that result from this process are unintentional.

## 2019-03-08 ENCOUNTER — Encounter (INDEPENDENT_AMBULATORY_CARE_PROVIDER_SITE_OTHER): Payer: Self-pay | Admitting: Family Medicine

## 2019-03-08 ENCOUNTER — Other Ambulatory Visit: Payer: Self-pay

## 2019-03-08 ENCOUNTER — Ambulatory Visit (INDEPENDENT_AMBULATORY_CARE_PROVIDER_SITE_OTHER): Payer: Medicare Other | Admitting: Family Medicine

## 2019-03-08 VITALS — BP 112/69 | HR 75 | Temp 98.1°F | Ht 65.0 in | Wt 208.0 lb

## 2019-03-08 DIAGNOSIS — R7303 Prediabetes: Secondary | ICD-10-CM | POA: Diagnosis not present

## 2019-03-08 DIAGNOSIS — Z6834 Body mass index (BMI) 34.0-34.9, adult: Secondary | ICD-10-CM

## 2019-03-08 DIAGNOSIS — E669 Obesity, unspecified: Secondary | ICD-10-CM

## 2019-03-09 ENCOUNTER — Encounter (INDEPENDENT_AMBULATORY_CARE_PROVIDER_SITE_OTHER): Payer: Self-pay | Admitting: Family Medicine

## 2019-03-09 NOTE — Telephone Encounter (Signed)
Please advise 

## 2019-03-14 NOTE — Progress Notes (Signed)
Chief Complaint:   Heather [name] is here to discuss her progress with her Heather treatment plan along with follow-up of her Heather related diagnoses. Heather [name] is practicing portion control and making smarter food choices, such as increasing vegetables and decreasing simple carbohydrates and states she is following her eating plan approximately 0% of the time. Heather [name] states she is on the elliptical for 30-35 minutes 1 time per week.  Today's visit was #: 64 Starting weight: 268 lbs Starting date: 12/23/16 Today's weight: 208 lbs Today's date: 03/08/2019 Total lbs lost to date: 60 Total lbs lost since last in-office visit: 0  Interim History: Heather [name] went off track over the holidays and did more celebration eating. She recognized her family made eating healthy difficult. Most of the holiday food is out of the home. She is ready to get back on track.  Subjective:   1. Pre-diabetes Heather [name] is working on diet and weight loss. She is not on metformin and denies hypoglycemia. She is ready to decrease simple carbohydrates.  Assessment/Plan:   1. Pre-diabetes Heather [name] will continue to work on weight loss, exercise, and decreasing simple carbohydrates to help decrease the risk of diabetes. We will continue to monitor.  2. Class 1 Heather with serious comorbidity and body mass index (BMI) of 34.0 to 34.9 in adult, unspecified Heather type Heather [name] is currently in the action stage of change. As such, her goal is to continue with weight loss efforts. She has agreed to following a lower carbohydrate, vegetable and lean protein rich diet plan.   We discussed the following exercise goals today: Older adults should follow the adult guidelines. When older adults cannot meet the adult guidelines, they should be as physically active as their abilities and conditions will allow.  Older adults should do exercises that maintain or improve balance if they are at risk of falling.   We discussed the  following behavioral modification strategies today: increasing lean protein intake, decreasing simple carbohydrates, no skipping meals and dealing with family or coworker sabotage.  Heather [name] has agreed to follow-up with our clinic in 3 to 4 weeks. She was informed of the importance of frequent follow-up visits to maximize her success with intensive lifestyle modifications for her multiple health conditions.   Objective:   Blood pressure 112/69, pulse 75, temperature 98.1 F (36.7 C), temperature source Oral, height 5' 5"  (1.651 m), weight 208 lb (94.3 kg), SpO2 99 %. Body mass index is 34.61 kg/m.  General: Cooperative, alert, well developed, in no acute distress. HEENT: Conjunctivae and lids unremarkable. Neck: No thyromegaly.  Cardiovascular: Regular rhythm.  Lungs: Normal work of breathing. Extremities: No edema.  Neurologic: No focal deficits.   Lab Results  Component Value Date   CREATININE 0.84 12/05/2018   BUN 22 12/05/2018   NA 141 12/05/2018   K 4.1 12/05/2018   CL 106 12/05/2018   CO2 25 12/05/2018   Lab Results  Component Value Date   ALT 14 12/05/2018   AST 19 12/05/2018   ALKPHOS 65 12/05/2018   BILITOT <0.2 12/05/2018   Lab Results  Component Value Date   HGBA1C 5.6 12/05/2018   HGBA1C 5.6 04/27/2018   HGBA1C 6.0 (H) 12/01/2017   HGBA1C 5.7 (H) 07/29/2017   HGBA1C 5.9 03/15/2017   Lab Results  Component Value Date   INSULIN 6.4 12/05/2018   INSULIN 3.6 04/27/2018   INSULIN 5.6 12/01/2017   INSULIN 6.8 07/29/2017   INSULIN 20.9 12/23/2016   Lab Results  Component Value Date   TSH 1.41 04/26/2018   Lab Results  Component Value Date   CHOL 178 12/05/2018   HDL 77 12/05/2018   LDLCALC 89 12/05/2018   TRIG 65 12/05/2018   CHOLHDL 3 03/05/2016   Lab Results  Component Value Date   WBC 3.7 (L) 01/18/2019   HGB 12.6 01/18/2019   HCT 38.4 01/18/2019   MCV 94.4 01/18/2019   PLT 219.0 01/18/2019   Lab Results  Component Value Date    IRON 43 (L) 04/26/2018   TIBC 249 (L) 04/26/2018   FERRITIN 60 04/26/2018    Heather Behavioral Intervention Documentation for Insurance:   Approximately 15 minutes were spent on the discussion below.  ASK: We discussed the diagnosis of Heather with Heather [name] today and Heather [name] agreed to give [name] permission to discuss Heather behavioral modification therapy today.  ASSESS: Heather [name] has the diagnosis of Heather and her BMI today is 34.61. Heather [name] is in the action stage of change.   ADVISE: Heather [name] was educated on the multiple health risks of Heather as well as the benefit of weight loss to improve her health. She was advised of the need for long term treatment and the importance of lifestyle modifications to improve her current health and to decrease her risk of future health problems.  AGREE: Multiple dietary modification options and treatment options were discussed and Heather [name] agreed to follow the recommendations documented in the above note.  ARRANGE: Heather [name] was educated on the importance of frequent visits to treat Heather as outlined per CMS and USPSTF guidelines and agreed to schedule her next follow up appointment today.  Attestation Statements:   Reviewed by clinician on day of visit: allergies, medications, problem list, medical history, surgical history, family history, social history, and previous encounter notes.  Time spent on visit including pre-visit chart review and post-visit care was 20 minutes.   I, [name], am acting as transcriptionist for [name], MD.  I have reviewed the above documentation for accuracy and completeness, and I agree with the above. -  [name], MD  .

## 2019-03-15 ENCOUNTER — Encounter: Payer: Self-pay | Admitting: Gastroenterology

## 2019-03-15 ENCOUNTER — Ambulatory Visit (INDEPENDENT_AMBULATORY_CARE_PROVIDER_SITE_OTHER): Payer: Medicare Other | Admitting: Gastroenterology

## 2019-03-15 VITALS — BP 140/80 | HR 64 | Temp 97.7°F | Ht 63.5 in | Wt 209.1 lb

## 2019-03-15 DIAGNOSIS — K449 Diaphragmatic hernia without obstruction or gangrene: Secondary | ICD-10-CM

## 2019-03-15 DIAGNOSIS — K439 Ventral hernia without obstruction or gangrene: Secondary | ICD-10-CM | POA: Diagnosis not present

## 2019-03-15 DIAGNOSIS — K5900 Constipation, unspecified: Secondary | ICD-10-CM | POA: Diagnosis not present

## 2019-03-15 DIAGNOSIS — K219 Gastro-esophageal reflux disease without esophagitis: Secondary | ICD-10-CM | POA: Diagnosis not present

## 2019-03-15 NOTE — Patient Instructions (Addendum)
Continue Miralax 1 capful (17 grams) every other day dissolved in at least 8 ounces water/juice.  Continue a high fiber diet.  Increase water intake.  Continue Protonix.  Follow antireflux measures.  Follow up with Dr Silverio Decamp in 6 months.  If you are age 73 or older, your body mass index should be between 23-30. Your Body mass index is 36.46 kg/m. If this is out of the aforementioned range listed, please consider follow up with your Primary Care Provider.  If you are age 37 or younger, your body mass index should be between 19-25. Your Body mass index is 36.46 kg/m. If this is out of the aformentioned range listed, please consider follow up with your Primary Care Provider.

## 2019-03-15 NOTE — Progress Notes (Signed)
Heather [name]    [id number]    Jul 05, 1945  Primary Care Physician:McLean-Scocuzza, Heather Glow, MD  Referring Physician: McLean-Scocuzza, Heather Glow, MD [place],  [place] [address]   Chief complaint:  Abdominal pain  HPI:  74 year old female with large hiatal hernia and ventral hernia here for follow-up visit.  She is doing better and feels her symptoms are stable.  No longer having any nausea, vomiting or abdominal pain  Denies any dysphagia or odynophagia  On Miralax 1-2 times a week, has  2-3 Bm a day  On weight loss regimen, is feeling better.  She feels her symptoms occur when she overeats.  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  Subsequently she saw Dr. Gaynelle [name] in [facility], she was very pleased with him and thought about doing the surgery but did not go ahead with it  given the logistics of commute to [facility]  CT pelvis 4/81/85: Periumbilical abdominal wall hernia containing fat, small bowel loop and appendix, no obstruction or incarceration. Diverticulosis, no diverticulitis. Uterine fibroids.  CT chest without contrast 01/14/2018: Very large hiatal hernia containing entire stomach and a significant portion of colon  EGD 12/08/2005: Large para esophageal hernia and gastric malrotation  Family history of colon cancer and personal history of adenomatous colon polyps  Colonoscopy June 18, 2016: Technically difficult exam due to restricted mobility of colon, ventral hernia and body habitus.  Pancolonic diverticulosis, 5 mm polyp removed from sigmoid colon, hypertrophied anal papilla otherwise unremarkable exam  Denies any melena, rectal bleeding, constipation or diarrhea.   Outpatient Encounter Medications as of 03/15/2019    Medication Sig  . acetaminophen (TYLENOL) 500 MG tablet Take 650 mg by mouth 3 (three) times daily as needed for pain.   . Calcium Carbonate-Vitamin D (CALCIUM 600 + D PO) Take 2 tablets by mouth daily.    . cetirizine (ZYRTEC) 10 MG tablet Take 10 mg by mouth daily.  . clopidogrel (PLAVIX) 75 MG tablet TAKE 1 TABLET DAILY  . Ferrous Sulfate (IRON) 325 (65 FE) MG TABS Take 1 tablet by mouth daily.    . furosemide (LASIX) 40 MG tablet Take 1 tablet (40 mg total) by mouth daily as needed.  . gabapentin (NEURONTIN) 100 MG capsule Take 2 capsules (200 mg total) by mouth at bedtime.  [name] glucosamine-chondroitin 500-400 MG tablet Take 1 tablet by mouth 2 (two) times daily.    [name] ipratropium (ATROVENT) 0.06 % nasal spray Place 2 sprays into both nostrils 4 (four) times daily.  . modafinil (PROVIGIL) 200 MG tablet modafinil 200 mg tablet  . Multiple Vitamin (MULTIVITAMIN) tablet Take 1 tablet by mouth daily.    . naproxen sodium (ANAPROX) 220 MG tablet Take 220 mg by mouth as needed.  . Omega-3 Fatty Acids (FISH OIL) 1000 MG CAPS Take 1 capsule by mouth daily.  . pantoprazole (PROTONIX) 20 MG tablet Take 1 tablet (20 mg total) by mouth daily. 30 minutes before food  . polyethylene glycol (MIRALAX / GLYCOLAX) 17 g packet Take 17 g by mouth daily as needed.  . potassium chloride SA (KLOR-CON M20) 20 MEQ tablet Take 1 tablet (20 mEq total) by mouth daily.  [name] pyridOXINE (VITAMIN B-6) 100 MG tablet Take 200  mg by mouth daily.  . rosuvastatin (CRESTOR) 40 MG tablet Take 1 tablet (40 mg total) by mouth daily. At night  . SF 5000 PLUS 1.1 % CREA dental cream   . SUMAtriptan (IMITREX) 20 MG/ACT nasal spray sumatriptan 20 mg/actuation nasal spray  . topiramate (TOPAMAX) 100 MG tablet Take 1 tablet (100 mg total) by mouth 2 (two) times daily.  . Turmeric 500 MG TABS Take 2 tablets by mouth daily.  [name] venlafaxine XR (EFFEXOR XR) 37.5 MG 24 hr capsule Take 1 capsule (37.5 mg total) by mouth daily with breakfast.  .  vitamin E 200 UNIT capsule Take 200 Units by mouth daily.   Facility-Administered Encounter Medications as of 03/15/2019  Medication  . 0.9 %  sodium chloride infusion    Allergies as of 03/15/2019 - Review Complete 03/08/2019  Allergen Reaction Noted  . Aspirin  08/04/2011  . Coconut oil  07/10/2013  . Lisinopril  06/03/2006  . Metoprolol Itching 02/12/2014  . Peanut-containing drug products  07/10/2013  . Penicillins  06/03/2006  . Prednisone  07/12/2012  . Strawberry extract  07/10/2013    Past Medical History:  Diagnosis Date  . Abdominal hernia   . Abnormal Pap smear   . ALLERGIC RHINITIS 10/22/2006  . Allergy   . ANEMIA 12/18/2008  . Arthritis    scoliosis moderate deg changes lumbar Xray 11/04/17   . ASYMPTOMATIC POSTMENOPAUSAL STATUS 11/22/2007  . Back pain   . CAD (coronary artery disease)    in LAD  . Complication of anesthesia   . Degenerative arthritis   . DIABETES MELLITUS, TYPE II 01/13/2007  . Diverticulosis   . Dyslipidemia   . Fibroid   . Frequent headaches   . GERD 07/20/2007  . GOITER, MULTINODULAR 07/20/2007  . Headache(784.0) 07/20/2007  . HEARING LOSS 11/22/2007  . Hiatal hernia   . Hiatal hernia    large  . History of chicken pox   . Hx of colposcopy with cervical biopsy   . HYPERCHOLESTEROLEMIA 01/13/2007  . Hyperglycemia   . HYPERTENSION 10/22/2006  . Lung nodule    unchanged since 05/26/13   . Migraines   . NASH (nonalcoholic steatohepatitis)   . Nocturnal hypoxemia 02/17/2013  . Obesity   . Obstructive sleep apnea   . OSA on CPAP    per neurology  . OSTEOARTHRITIS 10/22/2006  . Other chronic nonalcoholic liver disease 5/97/4163  . Sedimentation rate elevation   . Sleep apnea    on cpap  . Urine incontinence   . UTI (urinary tract infection)     Past Surgical History:  Procedure Laterality Date  . BREAST SURGERY  1984   Breast reduction b/l   . CATARACT EXTRACTION, BILATERAL    . DEXA  08/2005  . DILATION AND CURETTAGE OF UTERUS     . ELECTROCARDIOGRAM  10/15/2006  . ESOPHAGOGASTRODUODENOSCOPY  12/08/2005  . FOOT SURGERY     hammertoe and bunion 05/2017   . Stress Cardiolite  10/21/2005  . sweat gland removal    . WISDOM TOOTH EXTRACTION      Family History  Problem Relation Age of Onset  . Asthma Mother   . Depression Mother   . Bipolar disorder Mother   . Dementia Mother   . Arthritis Mother   . Breast cancer Mother        primary  . Colon cancer Mother        mets from breast  . Cancer Mother  colon  . Hypertension Father   . Migraines Father   . Cancer Maternal Aunt        ?  [name] Heart disease Maternal Grandmother   . Cancer Maternal Grandfather        stomach ?    Social History   Socioeconomic History  . Marital status: Married    Spouse name: [name]  . Number of children: 2  . Years of education: College  . Highest education level: Not on file  Occupational History  . Occupation: Retired  Tobacco Use  . Smoking status: Former Smoker    Packs/day: 2.00    Years: 20.00    Pack years: 40.00    Types: Cigarettes    Quit date: 03/03/1979    Years since quitting: 40.0  . Smokeless tobacco: Never Used  Substance and Sexual Activity  . Alcohol use: Yes    Comment: rare  . Drug use: No  . Sexual activity: Yes    Birth control/protection: Surgical  Other Topics Concern  . Not on file  Social History Narrative   Patient is married [name]).   Patient has two children; 3 pregnancies 2 live births    Patient is retired Designer, jewellery, Chiropodist    Patient has a Financial risk analyst.   Patient is right-handed.   Patient lives at home with family.   Caffeine Use: 2 soda every other day   Former smoker 20+ years ago 2 ppd quit in 35s   No guns, wears seat belt, safe in relationship    Social Determinants of Health   Financial Resource Strain: Low Risk   . Difficulty of Paying Living Expenses: Not hard at all  Food Insecurity: No Food  Insecurity  . Worried About Charity fundraiser in the Last Year: Never true  . Ran Out of Food in the Last Year: Never true  Transportation Needs: No Transportation Needs  . Lack of Transportation (Medical): No  . Lack of Transportation (Non-Medical): No  Physical Activity: Sufficiently Active  . Days of Exercise per Week: 4 days  . Minutes of Exercise per Session: 60 min  Stress: No Stress Concern Present  . Feeling of Stress : Not at all  Social Connections:   . Frequency of Communication with Friends and Family: Not on file  . Frequency of Social Gatherings with Friends and Family: Not on file  . Attends Religious Services: Not on file  . Active Member of Clubs or Organizations: Not on file  . Attends Archivist Meetings: Not on file  . Marital Status: Not on file  Intimate Partner Violence: Not At Risk  . Fear of Current or Ex-Partner: No  . Emotionally Abused: No  . Physically Abused: No  . Sexually Abused: No      Review of systems: Review of Systems  Constitutional: Negative for fever and chills.  HENT: Negative.   Eyes: Negative for blurred vision.  Respiratory: Negative for cough, shortness of breath and wheezing.   Cardiovascular: Negative for chest pain and palpitations.  Gastrointestinal: as per HPI Genitourinary: Negative for dysuria, urgency, frequency and hematuria.  Musculoskeletal: Positive for myalgias, back pain and joint pain.  Skin: Negative for itching and rash.  Neurological: Negative for dizziness, tremors, focal weakness, seizures and loss of consciousness.  Endo/Heme/Allergies: Negative Psychiatric/Behavioral: Negative for depression, suicidal ideas and hallucinations.  All other systems reviewed and are negative.   Physical Exam: Vitals:   03/15/19 1031  BP:  140/80  Pulse: 64  Temp: 97.7 F (36.5 C)   Body mass index is 36.46 kg/m. Gen:      No acute distress HEENT:  EOMI, sclera anicteric Neck:     No masses; no  thyromegaly Lungs:    Clear to auscultation bilaterally; normal respiratory effort CV:         Regular rate and rhythm; no murmurs Abd:      + bowel sounds; soft, non-tender; no palpable masses, no distension Ext:    No edema; adequate peripheral perfusion Skin:      Warm and dry; no rash Neuro: alert and oriented x 3 Psych: normal mood and affect  Data Reviewed:  Reviewed labs, radiology imaging, old records and pertinent past GI work up   Assessment and Plan/Recommendations:  74 year old female with large hiatal hernia involving the entire stomach, part of colon and large ventral hernia with small bowel loop No evidence of gastric volvulus, ventral hernia with obstruction or incarceration  Continue Protonix daily Discussed antireflux measures and lifestyle modifications Small frequent meals  Continue MiraLAX 1 capful every other day, titrate to have soft bowel movements daily Discussed signs of bowel obstruction, obstructed hernia or incarcerated hernia  Patient does not want to undergo repair of hiatal hernia or ventral hernia at this point  Family history of colon cancer and personal history of adenomatous colon polyps, due for surveillance colonoscopy April 2023  Return in 6 months or sooner if needed  This visit required 35 minutes of patient care (this includes precharting, chart review, review of results, face-to-face time used for counseling as well as treatment plan and follow-up. The patient was provided an opportunity to ask questions and all were answered. The patient agreed with the plan and demonstrated an understanding of the instructions.  Damaris Hippo , MD    CC: McLean-Scocuzza, Olivia Mackie *

## 2019-03-21 ENCOUNTER — Encounter (INDEPENDENT_AMBULATORY_CARE_PROVIDER_SITE_OTHER): Payer: Self-pay | Admitting: Family Medicine

## 2019-03-21 NOTE — Telephone Encounter (Signed)
Please advise 

## 2019-03-29 ENCOUNTER — Ambulatory Visit (INDEPENDENT_AMBULATORY_CARE_PROVIDER_SITE_OTHER): Payer: Medicare Other | Admitting: Physician Assistant

## 2019-03-29 ENCOUNTER — Encounter (INDEPENDENT_AMBULATORY_CARE_PROVIDER_SITE_OTHER): Payer: Self-pay | Admitting: Family Medicine

## 2019-03-29 ENCOUNTER — Ambulatory Visit (INDEPENDENT_AMBULATORY_CARE_PROVIDER_SITE_OTHER): Payer: Medicare Other | Admitting: Family Medicine

## 2019-03-29 ENCOUNTER — Other Ambulatory Visit: Payer: Self-pay

## 2019-03-29 VITALS — BP 112/70 | HR 68 | Temp 98.0°F | Ht 65.0 in | Wt 208.0 lb

## 2019-03-29 DIAGNOSIS — Z6834 Body mass index (BMI) 34.0-34.9, adult: Secondary | ICD-10-CM | POA: Diagnosis not present

## 2019-03-29 DIAGNOSIS — E669 Obesity, unspecified: Secondary | ICD-10-CM | POA: Diagnosis not present

## 2019-03-29 DIAGNOSIS — F3289 Other specified depressive episodes: Secondary | ICD-10-CM

## 2019-03-29 NOTE — Progress Notes (Signed)
Chief Complaint:   OBESITY Heather [name] is here to discuss her progress with her obesity treatment plan along with follow-up of her obesity related diagnoses. Heather [name] is on following a lower carbohydrate, vegetable and lean protein rich diet plan and states she is following her eating plan approximately 70% of the time. Heather [name] states she is on the elliptical for 30 minutes 2 times per week.  Today's visit was #: 80 Starting weight: 268 lbs Starting date: 12/23/16 Today's weight: 208 lbs Today's date: 03/29/2019 Total lbs lost to date: 60 Total lbs lost since last in-office visit: 0  Interim History: Heather [name] did some celebration eating for her son's birthday. She tried to portion control and has done well maintaining her weight. She noted increased carbohydrate cravings after indulging.  Subjective:   1. Other depression, with emotional eating Heather [name]'s mood is stable on Effexor. She has done well avoiding excessive emotional eating. She has already gotten back on track with her diet.  Assessment/Plan:   1. Other depression, with emotional eating Behavior modification techniques were discussed today to help Heather [name] deal with her emotional/non-hunger eating behaviors. Heather [name] agreed to continue Effexor as is. Orders and follow up as documented in patient record.   2. Class 1 obesity with serious comorbidity and body mass index (BMI) of 34.0 to 34.9 in adult, unspecified obesity type Heather [name] is currently in the action stage of change. As such, her goal is to continue with weight loss efforts. She has agreed to following a lower carbohydrate, vegetable and lean protein rich diet plan.   Exercise goals: No exercise has been prescribed at this time.  Behavioral modification strategies: emotional eating strategies.  Heather [name] has agreed to follow-up with our clinic in 3 to 4 weeks. She was informed of the importance of frequent follow-up visits to maximize her success with intensive lifestyle modifications  for her multiple health conditions.   Objective:   Blood pressure 112/70, pulse 68, temperature 98 F (36.7 C), temperature source Oral, height 5' 5"  (1.651 m), weight 208 lb (94.3 kg), SpO2 98 %. Body mass index is 34.61 kg/m.  General: Cooperative, alert, well developed, in no acute distress. HEENT: Conjunctivae and lids unremarkable. Cardiovascular: Regular rhythm.  Lungs: Normal work of breathing. Neurologic: No focal deficits.   Lab Results  Component Value Date   CREATININE 0.84 12/05/2018   BUN 22 12/05/2018   NA 141 12/05/2018   K 4.1 12/05/2018   CL 106 12/05/2018   CO2 25 12/05/2018   Lab Results  Component Value Date   ALT 14 12/05/2018   AST 19 12/05/2018   ALKPHOS 65 12/05/2018   BILITOT <0.2 12/05/2018   Lab Results  Component Value Date   HGBA1C 5.6 12/05/2018   HGBA1C 5.6 04/27/2018   HGBA1C 6.0 (H) 12/01/2017   HGBA1C 5.7 (H) 07/29/2017   HGBA1C 5.9 03/15/2017   Lab Results  Component Value Date   INSULIN 6.4 12/05/2018   INSULIN 3.6 04/27/2018   INSULIN 5.6 12/01/2017   INSULIN 6.8 07/29/2017   INSULIN 20.9 12/23/2016   Lab Results  Component Value Date   TSH 1.41 04/26/2018   Lab Results  Component Value Date   CHOL 178 12/05/2018   HDL 77 12/05/2018   LDLCALC 89 12/05/2018   TRIG 65 12/05/2018   CHOLHDL 3 03/05/2016   Lab Results  Component Value Date   WBC 3.7 (L) 01/18/2019   HGB 12.6 01/18/2019   HCT 38.4 01/18/2019   MCV 94.4 01/18/2019  PLT 219.0 01/18/2019   Lab Results  Component Value Date   IRON 43 (L) 04/26/2018   TIBC 249 (L) 04/26/2018   FERRITIN 60 04/26/2018   Attestation Statements:   Reviewed by clinician on day of visit: allergies, medications, problem list, medical history, surgical history, family history, social history, and previous encounter notes.  Time spent on visit including pre-visit chart review and post-visit care was 23 minutes.   I, [name], am acting as transcriptionist for [name], MD.  I have reviewed the above documentation for accuracy and completeness, and I agree with the above. -  [name], MD

## 2019-04-20 ENCOUNTER — Ambulatory Visit: Payer: Medicare Other | Admitting: Podiatry

## 2019-04-26 ENCOUNTER — Encounter (INDEPENDENT_AMBULATORY_CARE_PROVIDER_SITE_OTHER): Payer: Self-pay | Admitting: Family Medicine

## 2019-04-26 ENCOUNTER — Telehealth (INDEPENDENT_AMBULATORY_CARE_PROVIDER_SITE_OTHER): Payer: Medicare Other | Admitting: Family Medicine

## 2019-04-26 ENCOUNTER — Other Ambulatory Visit: Payer: Self-pay

## 2019-04-26 DIAGNOSIS — E669 Obesity, unspecified: Secondary | ICD-10-CM

## 2019-04-26 DIAGNOSIS — F338 Other recurrent depressive disorders: Secondary | ICD-10-CM | POA: Diagnosis not present

## 2019-04-26 DIAGNOSIS — Z6834 Body mass index (BMI) 34.0-34.9, adult: Secondary | ICD-10-CM

## 2019-04-26 NOTE — Progress Notes (Signed)
TeleHealth Visit:  Due to the COVID-19 pandemic, this visit was completed with telemedicine (audio/video) technology to reduce patient and provider exposure as well as to preserve personal protective equipment.   Rosie has verbally consented to this TeleHealth visit. The patient is located at home, the provider is located at the [facility] office. The participants in this visit include the listed provider and patient. Charnay was unable to use realtime audiovisual technology today and the telehealth visit was conducted via telephone.   Chief Complaint: OBESITY Liylah is here to discuss her progress with her obesity treatment plan along with follow-up of her obesity related diagnoses. Bob is on the Category 2 Plan and states she is following her eating plan approximately 100% of the time. Adesuwa states she is on the elliptical for 30-35 minutes 3-4 times per week.  Today's visit was #: 97 Starting weight: 268 lbs Starting date: 12/23/16  Interim History: Marvie states she is doing better with weight loss again after changing to the Category 2 plan. She isn't losing weight as quickly as she used to. She states her weight today was 206 lbs (down 2 lbs).  Subjective:   1. Seasonal affective disorder ([place]) Karaline notes decreased energy and is feeling more "dragging". This is worse in the last month, and appears to be related to seasonal affective disorder as well as COVID isolation.  Assessment/Plan:   1. Seasonal affective disorder ([place]) Arlette was advised to get as much sun as possible and as the days lengthen, she will naturally feel better. We will continue to monitor.  2. Class 1 obesity with serious comorbidity and body mass index (BMI) of 34.0 to 34.9 in adult, unspecified obesity type Iliza is currently in the action stage of change. As such, her goal is to continue with weight loss efforts. She has agreed to the Category 2 Plan.   Exercise goals: Aubrina is to continue  her current exercise regimen as is.  Behavioral modification strategies: emotional eating strategies.  Liona has agreed to follow-up with our clinic in 3 weeks. She was informed of the importance of frequent follow-up visits to maximize her success with intensive lifestyle modifications for her multiple health conditions.  Objective:   VITALS: Per patient if applicable, see vitals. GENERAL: Alert and in no acute distress. CARDIOPULMONARY: No increased WOB. Speaking in clear sentences.  PSYCH: Pleasant and cooperative. Speech normal rate and rhythm. Affect is appropriate. Insight and judgement are appropriate. Attention is focused, linear, and appropriate.  NEURO: Oriented as arrived to appointment on time with no prompting.   Lab Results  Component Value Date   CREATININE 0.84 12/05/2018   BUN 22 12/05/2018   NA 141 12/05/2018   K 4.1 12/05/2018   CL 106 12/05/2018   CO2 25 12/05/2018   Lab Results  Component Value Date   ALT 14 12/05/2018   AST 19 12/05/2018   ALKPHOS 65 12/05/2018   BILITOT <0.2 12/05/2018   Lab Results  Component Value Date   HGBA1C 5.6 12/05/2018   HGBA1C 5.6 04/27/2018   HGBA1C 6.0 (H) 12/01/2017   HGBA1C 5.7 (H) 07/29/2017   HGBA1C 5.9 03/15/2017   Lab Results  Component Value Date   INSULIN 6.4 12/05/2018   INSULIN 3.6 04/27/2018   INSULIN 5.6 12/01/2017   INSULIN 6.8 07/29/2017   INSULIN 20.9 12/23/2016   Lab Results  Component Value Date   TSH 1.41 04/26/2018   Lab Results  Component Value Date   CHOL  178 12/05/2018   HDL 77 12/05/2018   LDLCALC 89 12/05/2018   TRIG 65 12/05/2018   CHOLHDL 3 03/05/2016   Lab Results  Component Value Date   WBC 3.7 (L) 01/18/2019   HGB 12.6 01/18/2019   HCT 38.4 01/18/2019   MCV 94.4 01/18/2019   PLT 219.0 01/18/2019   Lab Results  Component Value Date   IRON 43 (L) 04/26/2018   TIBC 249 (L) 04/26/2018   FERRITIN 60 04/26/2018    Attestation Statements:   Reviewed by clinician on day  of visit: allergies, medications, problem list, medical history, surgical history, family history, social history, and previous encounter notes.  Time spent on visit including pre-visit chart review and post-visit care was 22 minutes.    I, [name], am acting as transcriptionist for [name], MD.  I have reviewed the above documentation for accuracy and completeness, and I agree with the above. - [name], MD

## 2019-04-27 ENCOUNTER — Encounter: Payer: Self-pay | Admitting: Internal Medicine

## 2019-04-27 ENCOUNTER — Ambulatory Visit (INDEPENDENT_AMBULATORY_CARE_PROVIDER_SITE_OTHER): Payer: Medicare Other | Admitting: Internal Medicine

## 2019-04-27 ENCOUNTER — Other Ambulatory Visit: Payer: Self-pay

## 2019-04-27 VITALS — BP 136/78 | HR 53 | Temp 97.5°F | Ht 65.0 in | Wt 212.0 lb

## 2019-04-27 DIAGNOSIS — D72819 Decreased white blood cell count, unspecified: Secondary | ICD-10-CM

## 2019-04-27 DIAGNOSIS — G8929 Other chronic pain: Secondary | ICD-10-CM

## 2019-04-27 DIAGNOSIS — K439 Ventral hernia without obstruction or gangrene: Secondary | ICD-10-CM

## 2019-04-27 DIAGNOSIS — R413 Other amnesia: Secondary | ICD-10-CM | POA: Insufficient documentation

## 2019-04-27 DIAGNOSIS — M25561 Pain in right knee: Secondary | ICD-10-CM

## 2019-04-27 DIAGNOSIS — E041 Nontoxic single thyroid nodule: Secondary | ICD-10-CM | POA: Insufficient documentation

## 2019-04-27 DIAGNOSIS — K219 Gastro-esophageal reflux disease without esophagitis: Secondary | ICD-10-CM | POA: Diagnosis not present

## 2019-04-27 DIAGNOSIS — M25562 Pain in left knee: Secondary | ICD-10-CM

## 2019-04-27 NOTE — Patient Instructions (Addendum)
voltaren gel  Nature wise Tumeric/ginger/Curcumin   Dr. Alusio/Dr. Alvan Dame ortho  Memory Compensation Strategies  1. Use "WARM" strategy.  W= write it down  A= associate it  R= repeat it  M= make a mental note  2.   You can keep a Social worker.  Use a 3-ring notebook with sections for the following: calendar, important names and phone numbers,  medications, doctors' names/phone numbers, lists/reminders, and a section to journal what you did  each day.   3.    Use a calendar to write appointments down.  4.    Write yourself a schedule for the day.  This can be placed on the calendar or in a separate section of the Memory Notebook.  Keeping a  regular schedule can help memory.  5.    Use medication organizer with sections for each day or morning/evening pills.  You may need help loading it  6.    Keep a basket, or pegboard by the door.  Place items that you need to take out with you in the basket or on the pegboard.  You may also want to  include a message board for reminders.  7.    Use sticky notes.  Place sticky notes with reminders in a place where the task is performed.  For example: " turn off the  stove" placed by the stove, "lock the door" placed on the door at eye level, " take your medications" on  the bathroom mirror or by the place where you normally take your medications.  8.    Use alarms/timers.  Use while cooking to remind yourself to check on food or as a reminder to take your medicine, or as a  reminder to make a call, or as a reminder to perform another task, etc.

## 2019-04-27 NOTE — Progress Notes (Signed)
Chief Complaint  Patient presents with  . Follow-up   F/u with husband  1. GERD improved on protonix 20 mg qd  2. Hernia f/u pending Dr. Johnathan Hausen  3. Chronic knee pain with OA appt Dr. Tamala Julian upcoming injection worse with sitting and taking steps and knees freeze if she sits too long she is able to exercise with elliptical w/o pain  4. Recently skin itching after peanuts but she was allergic resolved for now  5. Worsening memory short term and details will f/u with neurology Dr. Brett Fairy she tries to do puzzles to keep mind active but memory still declinign she thinks  6. Chronic leg edema/ankle esp L>R compression stockings on today and prn lasix she takes and helps 7. Left wrist cyst resolved   Review of Systems  Constitutional: Negative for weight loss.  HENT: Negative for hearing loss.   Eyes: Negative for blurred vision.  Cardiovascular: Positive for leg swelling.  Gastrointestinal: Negative for abdominal pain.  Musculoskeletal: Positive for joint pain.  Skin: Negative for rash.  Psychiatric/Behavioral: Positive for memory loss.   Past Medical History:  Diagnosis Date  . Abdominal hernia   . Abnormal Pap smear   . ALLERGIC RHINITIS 10/22/2006  . Allergy   . ANEMIA 12/18/2008  . Arthritis    scoliosis moderate deg changes lumbar Xray 11/04/17   . ASYMPTOMATIC POSTMENOPAUSAL STATUS 11/22/2007  . Back pain   . CAD (coronary artery disease)    in LAD  . Complication of anesthesia   . Degenerative arthritis   . DIABETES MELLITUS, TYPE II 01/13/2007  . Diverticulosis   . Dyslipidemia   . Fibroid   . Frequent headaches   . GERD 07/20/2007  . GOITER, MULTINODULAR 07/20/2007  . Headache(784.0) 07/20/2007  . HEARING LOSS 11/22/2007  . Hiatal hernia   . Hiatal hernia    large  . History of chicken pox   . Hx of colposcopy with cervical biopsy   . HYPERCHOLESTEROLEMIA 01/13/2007  . Hyperglycemia   . HYPERTENSION 10/22/2006  . Lung nodule    unchanged since 05/26/13   .  Migraines   . NASH (nonalcoholic steatohepatitis)   . Nocturnal hypoxemia 02/17/2013  . Obesity   . Obstructive sleep apnea   . OSA on CPAP    per neurology  . OSTEOARTHRITIS 10/22/2006  . Other chronic nonalcoholic liver disease 6/37/8588  . Sedimentation rate elevation   . Sleep apnea    on cpap  . Urine incontinence   . UTI (urinary tract infection)    Past Surgical History:  Procedure Laterality Date  . BREAST SURGERY  1984   Breast reduction b/l   . CATARACT EXTRACTION, BILATERAL    . DEXA  08/2005  . DILATION AND CURETTAGE OF UTERUS    . ELECTROCARDIOGRAM  10/15/2006  . ESOPHAGOGASTRODUODENOSCOPY  12/08/2005  . FOOT SURGERY     hammertoe and bunion 05/2017   . Stress Cardiolite  10/21/2005  . sweat gland removal    . WISDOM TOOTH EXTRACTION     Family History  Problem Relation Age of Onset  . Asthma Mother   . Depression Mother   . Bipolar disorder Mother   . Dementia Mother   . Arthritis Mother   . Breast cancer Mother        primary  . Colon cancer Mother        mets from breast  . Cancer Mother        colon  . Hypertension Father   .  Migraines Father   . Cancer Maternal Aunt        ?  [name] Heart disease Maternal Grandmother   . Cancer Maternal Grandfather        stomach ?   Social History   Socioeconomic History  . Marital status: Married    Spouse name: [name]  . Number of children: 2  . Years of education: College  . Highest education level: Not on file  Occupational History  . Occupation: Retired  Tobacco Use  . Smoking status: Former Smoker    Packs/day: 2.00    Years: 20.00    Pack years: 40.00    Types: Cigarettes    Quit date: 03/03/1979    Years since quitting: 40.1  . Smokeless tobacco: Never Used  Substance and Sexual Activity  . Alcohol use: Yes    Comment: rare  . Drug use: No  . Sexual activity: Yes    Birth control/protection: Surgical  Other Topics Concern  . Not on file  Social History Narrative   Patient is married  [name]).   Patient has two children; 3 pregnancies 2 live births    Patient is retired Designer, jewellery, Chiropodist    Patient has a Financial risk analyst.   Patient is right-handed.   Patient lives at home with family.   Caffeine Use: 2 soda every other day   Former smoker 20+ years ago 2 ppd quit in 24s   No guns, wears seat belt, safe in relationship    Social Determinants of Health   Financial Resource Strain: Low Risk   . Difficulty of Paying Living Expenses: Not hard at all  Food Insecurity: No Food Insecurity  . Worried About Charity fundraiser in the Last Year: Never true  . Ran Out of Food in the Last Year: Never true  Transportation Needs: No Transportation Needs  . Lack of Transportation (Medical): No  . Lack of Transportation (Non-Medical): No  Physical Activity: Sufficiently Active  . Days of Exercise per Week: 4 days  . Minutes of Exercise per Session: 60 min  Stress: No Stress Concern Present  . Feeling of Stress : Not at all  Social Connections:   . Frequency of Communication with Friends and Family: Not on file  . Frequency of Social Gatherings with Friends and Family: Not on file  . Attends Religious Services: Not on file  . Active Member of Clubs or Organizations: Not on file  . Attends Archivist Meetings: Not on file  . Marital Status: Not on file  Intimate Partner Violence: Not At Risk  . Fear of Current or Ex-Partner: No  . Emotionally Abused: No  . Physically Abused: No  . Sexually Abused: No   Current Meds  Medication Sig  . acetaminophen (TYLENOL) 500 MG tablet Take 650 mg by mouth 3 (three) times daily as needed for pain.   . cetirizine (ZYRTEC) 10 MG tablet Take 10 mg by mouth daily.  . clopidogrel (PLAVIX) 75 MG tablet TAKE 1 TABLET DAILY  . Ferrous Sulfate (IRON) 325 (65 FE) MG TABS Take 1 tablet by mouth daily.    . furosemide (LASIX) 40 MG tablet Take 1 tablet (40 mg total) by mouth daily as  needed.  . gabapentin (NEURONTIN) 100 MG capsule Take 2 capsules (200 mg total) by mouth at bedtime.  [name] glucosamine-chondroitin 500-400 MG tablet Take 1 tablet by mouth 2 (two) times daily.    [name] ipratropium (ATROVENT) 0.06 %  nasal spray Place 2 sprays into both nostrils 4 (four) times daily.  . Multiple Vitamin (MULTIVITAMIN) tablet Take 1 tablet by mouth daily.    . Omega-3 Fatty Acids (FISH OIL) 1000 MG CAPS Take 1 capsule by mouth daily.  . pantoprazole (PROTONIX) 20 MG tablet Take 1 tablet (20 mg total) by mouth daily. 30 minutes before food  . polyethylene glycol (MIRALAX / GLYCOLAX) 17 g packet Take 17 g by mouth daily as needed.  . potassium chloride SA (KLOR-CON M20) 20 MEQ tablet Take 1 tablet (20 mEq total) by mouth daily.  [name] pyridOXINE (VITAMIN B-6) 100 MG tablet Take 200 mg by mouth daily.  . rosuvastatin (CRESTOR) 40 MG tablet Take 1 tablet (40 mg total) by mouth daily. At night  . SF 5000 PLUS 1.1 % CREA dental cream   . SUMAtriptan (IMITREX) 20 MG/ACT nasal spray sumatriptan 20 mg/actuation nasal spray  . topiramate (TOPAMAX) 100 MG tablet Take 1 tablet (100 mg total) by mouth 2 (two) times daily.  . Turmeric 500 MG TABS Take 2 tablets by mouth daily.  [name] venlafaxine XR (EFFEXOR XR) 37.5 MG 24 hr capsule Take 1 capsule (37.5 mg total) by mouth daily with breakfast.  . vitamin E 200 UNIT capsule Take 200 Units by mouth daily.   Current Facility-Administered Medications for the 04/27/19 encounter (Office Visit) with McLean-Scocuzza, [name], MD  Medication  . 0.9 %  sodium chloride infusion   Allergies  Allergen Reactions  . Aspirin   . Coconut Oil   . Lisinopril     REACTION: cough  . Metoprolol Itching  . Peanut-Containing Drug Products   . Penicillins     REACTION: rash  . Prednisone   . Strawberry Extract    No results found for this or any previous visit (from the past 2160 hour(s)). Objective  Body mass index is 35.28 kg/m. Wt Readings from Last 3 Encounters:   04/27/19 212 lb (96.2 kg)  03/29/19 208 lb (94.3 kg)  03/15/19 209 lb 2 oz (94.9 kg)   Temp Readings from Last 3 Encounters:  04/27/19 (!) 97.5 F (36.4 C) (Temporal)  03/29/19 98 F (36.7 C) (Oral)  03/15/19 97.7 F (36.5 C)   BP Readings from Last 3 Encounters:  04/27/19 136/78  03/29/19 112/70  03/15/19 140/80   Pulse Readings from Last 3 Encounters:  04/27/19 (!) 53  03/29/19 68  03/15/19 64    Physical Exam Vitals and nursing note reviewed.  Constitutional:      Appearance: Normal appearance. She is well-developed and well-groomed. She is obese.  HENT:     Head: Normocephalic and atraumatic.  Eyes:     Conjunctiva/sclera: Conjunctivae normal.     Pupils: Pupils are equal, round, and reactive to light.  Cardiovascular:     Rate and Rhythm: Normal rate and regular rhythm.     Heart sounds: Normal heart sounds. No murmur.     Comments: 1 to 2+ EDEMA left ankle  Pulmonary:     Effort: Pulmonary effort is normal.     Breath sounds: Normal breath sounds.  Abdominal:     Palpations: Abdomen is soft.     Tenderness: There is no abdominal tenderness.     Hernia: A hernia is present.  Skin:    General: Skin is warm and dry.  Neurological:     General: No focal deficit present.     Mental Status: She is alert and oriented to person, place, and time. Mental status is  at baseline.     Gait: Gait normal.  Psychiatric:        Attention and Perception: Attention and perception normal.        Mood and Affect: Mood and affect normal.        Speech: Speech normal.        Behavior: Behavior normal. Behavior is cooperative.        Thought Content: Thought content normal.        Cognition and Memory: Cognition normal.        Judgment: Judgment normal.     Assessment  Plan  Leukopenia, unspecified type - Plan: CBC with Differential/Platelet  Thyroid nodule - Plan: TSH Benign previously   Gastroesophageal reflux disease without esophagitis protonix 20 mg helping    Ventral hernia without obstruction or gangrene F/u Dr.Matthew martin upcoming   Chronic pain of both knees -disc ortho Dr. Gomez Cleverly in future If needed currently f/u Dr. Tamala Julian  Disc voltaren gel otc   Memory loss  -f/u neurology in the future to disc tx options if any  HM Flu shot high doseutd prevnar had 01/30/14 pna 23 given today 01/18/2019  zostavax had 01/30/14 -consider shingrix given Rx for this today 01/18/19  Tdap 01/30/14  covid 19 1/2 had and 2nd dose today 04/27/19   DEXA 03/06/15 normal   mammo copy get from Dr. Leo Grosser [facility] -per pt she will f/u with Dr. Mancel Bale who ordered her mammogram -mammogram 06/20/18 normal  Pap out of age window  Colonoscopy 06/18/16 severe diverticulosis polyp x 1 tubular adenoma FH mom colon cancer f/u in 5 years  HCV neg 12/28/11  Eye exam as at f/u and consider if not had  Former smoker quit in 57s smoker x 20 years+ 2 ppd. CT 01/14/18 mild COPD (off Advair), stable goiter, mild CAD, stable left lung nodule lower lobe likely benign CT chest 12/2017 benign lung nodules noted   [facility] Dr. Leafy Ro, congratulated   Cardiologist Dr. [name]  [facility] 09/09/[facility]   Provider: Dr. Olivia Mackie McLean-Scocuzza-Internal Medicine

## 2019-04-28 LAB — CBC WITH DIFFERENTIAL/PLATELET
Basophils Absolute: 0 10*3/uL (ref 0.0–0.1)
Basophils Relative: 1 % (ref 0.0–3.0)
Eosinophils Absolute: 0.1 10*3/uL (ref 0.0–0.7)
Eosinophils Relative: 3 % (ref 0.0–5.0)
HCT: 38.3 % (ref 36.0–46.0)
Hemoglobin: 12.8 g/dL (ref 12.0–15.0)
Lymphocytes Relative: 37.8 % (ref 12.0–46.0)
Lymphs Abs: 1.3 10*3/uL (ref 0.7–4.0)
MCHC: 33.5 g/dL (ref 30.0–36.0)
MCV: 93.6 fl (ref 78.0–100.0)
Monocytes Absolute: 0.3 10*3/uL (ref 0.1–1.0)
Monocytes Relative: 10.2 % (ref 3.0–12.0)
Neutro Abs: 1.6 10*3/uL (ref 1.4–7.7)
Neutrophils Relative %: 48 % (ref 43.0–77.0)
Platelets: 192 10*3/uL (ref 150.0–400.0)
RBC: 4.09 Mil/uL (ref 3.87–5.11)
RDW: 15.3 % (ref 11.5–15.5)
WBC: 3.4 10*3/uL — ABNORMAL LOW (ref 4.0–10.5)

## 2019-04-28 LAB — TSH: TSH: 0.93 u[IU]/mL (ref 0.35–4.50)

## 2019-05-01 ENCOUNTER — Encounter: Payer: Self-pay | Admitting: Family Medicine

## 2019-05-02 ENCOUNTER — Encounter: Payer: Self-pay | Admitting: Family Medicine

## 2019-05-02 ENCOUNTER — Other Ambulatory Visit: Payer: Self-pay

## 2019-05-02 ENCOUNTER — Ambulatory Visit (INDEPENDENT_AMBULATORY_CARE_PROVIDER_SITE_OTHER): Payer: Medicare Other | Admitting: Family Medicine

## 2019-05-02 DIAGNOSIS — M170 Bilateral primary osteoarthritis of knee: Secondary | ICD-10-CM | POA: Diagnosis not present

## 2019-05-02 NOTE — Assessment & Plan Note (Signed)
Repeat injection given today, tolerated the procedure well, discussed with home exercise, discussed importance of activity.  This is a chronic problem with mild exacerbation.  Encourage bracing.  Social determinants of health include patient's other difficulties to keep her from working out as well as transportation and walking on regular basis.  Patient will consider formal physical therapy, encourage weight loss.  Follow-up again in 10 weeks

## 2019-05-02 NOTE — Progress Notes (Signed)
[place] Phone: [phone] Subjective:   Heather [name], am serving as a scribe for Dr. Hulan Saas. This visit occurred during the SARS-CoV-2 public health emergency.  Safety protocols were in place, including screening questions prior to the visit, additional usage of staff PPE, and extensive cleaning of exam room while observing appropriate contact time as indicated for disinfecting solutions.   I'm seeing this patient by the request  of:  McLean-Scocuzza, [name], MD  CC: Bilateral knee pain  YQI:[id number]   02/21/2019 Bilateral injections given, tolerated the procedure well, discussed icing regimen and home exercises, topical anti-inflammatories as needed.  Encourage patient to continue with weight loss.  Follow-up again in 4 to 8 weeks  Update 05/02/2019 Heather [name] is a 74 y.o. female coming in with complaint of bilateral knee pain. Patient states that the injections given last visit were not lasting as long. Did get the 2nd COVID vaccination and her knee pain decreased. Is wearing knee brace on left knee for stability.  Known arthritic changes.  Chronic problem.  Seems to be getting worse recently.  Having difficulty with even walking greater than 100 feet.  Increasing swelling noted as well.    Past Medical History:  Diagnosis Date  . Abdominal hernia   . Abnormal Pap smear   . ALLERGIC RHINITIS 10/22/2006  . Allergy   . ANEMIA 12/18/2008  . Arthritis    scoliosis moderate deg changes lumbar Xray 11/04/17   . ASYMPTOMATIC POSTMENOPAUSAL STATUS 11/22/2007  . Back pain   . CAD (coronary artery disease)    in LAD  . Complication of anesthesia   . Degenerative arthritis   . DIABETES MELLITUS, TYPE II 01/13/2007  . Diverticulosis   . Dyslipidemia   . Fibroid   . Frequent headaches   . GERD 07/20/2007  . GOITER, MULTINODULAR 07/20/2007  . Headache(784.0) 07/20/2007  . HEARING LOSS 11/22/2007  .  Hiatal hernia   . Hiatal hernia    large  . History of chicken pox   . Hx of colposcopy with cervical biopsy   . HYPERCHOLESTEROLEMIA 01/13/2007  . Hyperglycemia   . HYPERTENSION 10/22/2006  . Lung nodule    unchanged since 05/26/13   . Migraines   . NASH (nonalcoholic steatohepatitis)   . Nocturnal hypoxemia 02/17/2013  . Obesity   . Obstructive sleep apnea   . OSA on CPAP    per neurology  . OSTEOARTHRITIS 10/22/2006  . Other chronic nonalcoholic liver disease 8/75/6433  . Sedimentation rate elevation   . Sleep apnea    on cpap  . Urine incontinence   . UTI (urinary tract infection)    Past Surgical History:  Procedure Laterality Date  . BREAST SURGERY  1984   Breast reduction b/l   . CATARACT EXTRACTION, BILATERAL    . DEXA  08/2005  . DILATION AND CURETTAGE OF UTERUS    . ELECTROCARDIOGRAM  10/15/2006  . ESOPHAGOGASTRODUODENOSCOPY  12/08/2005  . FOOT SURGERY     hammertoe and bunion 05/2017   . Stress Cardiolite  10/21/2005  . sweat gland removal    . WISDOM TOOTH EXTRACTION     Social History   Socioeconomic History  . Marital status: Married    Spouse name: [name]  . Number of children: 2  . Years of education: College  . Highest education level: Not on file  Occupational History  . Occupation: Retired  Tobacco Use  . Smoking  status: Former Smoker    Packs/day: 2.00    Years: 20.00    Pack years: 40.00    Types: Cigarettes    Quit date: 03/03/1979    Years since quitting: 40.1  . Smokeless tobacco: Never Used  Substance and Sexual Activity  . Alcohol use: Yes    Comment: rare  . Drug use: [name]  . Sexual activity: Yes    Birth control/protection: Surgical  Other Topics Concern  . Not on file  Social History Narrative   Patient is married [name]).   Patient has two children; 3 pregnancies 2 live births    Patient is retired Designer, jewellery, Chiropodist    Patient has a Financial risk analyst.   Patient is  right-handed.   Patient lives at home with family.   Caffeine Use: 2 soda every other day   Former smoker 20+ years ago 2 ppd quit in 57s   [name] guns, wears seat belt, safe in relationship    Social Determinants of Health   Financial Resource Strain: Low Risk   . Difficulty of Paying Living Expenses: Not hard at all  Food Insecurity: [name] Food Insecurity  . Worried About Charity fundraiser in the Last Year: Never true  . Ran Out of Food in the Last Year: Never true  Transportation Needs: [name] Transportation Needs  . Lack of Transportation (Medical): [name]  . Lack of Transportation (Non-Medical): [name]  Physical Activity: Sufficiently Active  . Days of Exercise per Week: 4 days  . Minutes of Exercise per Session: 60 min  Stress: [name] Stress Concern Present  . Feeling of Stress : Not at all  Social Connections:   . Frequency of Communication with Friends and Family: Not on file  . Frequency of Social Gatherings with Friends and Family: Not on file  . Attends Religious Services: Not on file  . Active Member of Clubs or Organizations: Not on file  . Attends Archivist Meetings: Not on file  . Marital Status: Not on file   Allergies  Allergen Reactions  . Aspirin   . Coconut Oil   . Lisinopril     REACTION: cough  . Metoprolol Itching  . Peanut-Containing Drug Products   . Penicillins     REACTION: rash  . Prednisone   . Strawberry Extract    Family History  Problem Relation Age of Onset  . Asthma Mother   . Depression Mother   . Bipolar disorder Mother   . Dementia Mother   . Arthritis Mother   . Breast cancer Mother        primary  . Colon cancer Mother        mets from breast  . Cancer Mother        colon  . Hypertension Father   . Migraines Father   . Cancer Maternal Aunt        ?  [name] Heart disease Maternal Grandmother   . Cancer Maternal Grandfather        stomach ?      Current Outpatient Medications (Cardiovascular):  .  furosemide (LASIX) 40 MG  tablet, Take 1 tablet (40 mg total) by mouth daily as needed. .  rosuvastatin (CRESTOR) 40 MG tablet, Take 1 tablet (40 mg total) by mouth daily. At night   Current Outpatient Medications (Respiratory):  .  cetirizine (ZYRTEC) 10 MG tablet, Take 10 mg by mouth daily. [name]  ipratropium (ATROVENT) 0.06 % nasal spray, Place  2 sprays into both nostrils 4 (four) times daily.   Current Outpatient Medications (Analgesics):  .  acetaminophen (TYLENOL) 500 MG tablet, Take 650 mg by mouth 3 (three) times daily as needed for pain.  .  naproxen sodium (ANAPROX) 220 MG tablet, Take 220 mg by mouth as needed. .  SUMAtriptan (IMITREX) 20 MG/ACT nasal spray, sumatriptan 20 mg/actuation nasal spray   Current Outpatient Medications (Hematological):  .  clopidogrel (PLAVIX) 75 MG tablet, TAKE 1 TABLET DAILY .  Ferrous Sulfate (IRON) 325 (65 FE) MG TABS, Take 1 tablet by mouth daily.     Current Outpatient Medications (Other):  [name]  Calcium Carbonate-Vitamin D (CALCIUM 600 + D PO), Take 2 tablets by mouth daily.   [name]  gabapentin (NEURONTIN) 100 MG capsule, Take 2 capsules (200 mg total) by mouth at bedtime. [name]  glucosamine-chondroitin 500-400 MG tablet, Take 1 tablet by mouth 2 (two) times daily.   .  Multiple Vitamin (MULTIVITAMIN) tablet, Take 1 tablet by mouth daily.   .  Omega-3 Fatty Acids (FISH OIL) 1000 MG CAPS, Take 1 capsule by mouth daily. .  pantoprazole (PROTONIX) 20 MG tablet, Take 1 tablet (20 mg total) by mouth daily. 30 minutes before food .  polyethylene glycol (MIRALAX / GLYCOLAX) 17 g packet, Take 17 g by mouth daily as needed. .  potassium chloride SA (KLOR-CON M20) 20 MEQ tablet, Take 1 tablet (20 mEq total) by mouth daily. [name]  pyridOXINE (VITAMIN B-6) 100 MG tablet, Take 200 mg by mouth daily. .  SF 5000 PLUS 1.1 % CREA dental cream,  .  topiramate (TOPAMAX) 100 MG tablet, Take 1 tablet (100 mg total) by mouth 2 (two) times daily. .  Turmeric 500 MG TABS, Take 2 tablets by mouth daily. [name]   venlafaxine XR (EFFEXOR XR) 37.5 MG 24 hr capsule, Take 1 capsule (37.5 mg total) by mouth daily with breakfast. .  vitamin E 200 UNIT capsule, Take 200 Units by mouth daily.  Current Facility-Administered Medications (Other):  .  0.9 %  sodium chloride infusion   Reviewed prior external information including notes and imaging from  primary care provider As well as notes that were available from care everywhere and other healthcare systems.  Past medical history, social, surgical and family history all reviewed in electronic medical record.  [name] pertanent information unless stated regarding to the chief complaint.   Review of Systems:  [name] headache, visual changes, nausea, vomiting, diarrhea, constipation, dizziness, abdominal pain, skin rash, fevers, chills, night sweats, weight loss, swollen lymph nodes, , chest pain, shortness of breath, mood changes. POSITIVE muscle aches, joint swelling, body aches  Objective  Blood pressure 116/78, pulse 65, height 5' 5"  (1.651 m), weight 202 lb (91.6 kg), SpO2 99 %.   General: [name] apparent distress alert and oriented x3 mood and affect normal, dressed appropriately.  Has had some weight loss since previous exam HEENT: Pupils equal, extraocular movements intact  Respiratory: Patient's speak in full sentences and does not appear short of breath  Cardiovascular: 1 +lower extremity edema, non tender, [name] erythema  Skin: Warm dry intact with [name] signs of infection or rash on extremities or on axial skeleton.  Abdomen: Soft nontender  Neuro: Cranial nerves II through XII are intact, neurovascularly intact in all extremities with 2+ DTRs and 2+ pulses.  Lymph: [name] lymphadenopathy of posterior or anterior cervical chain or axillae bilaterally.  Gait antalgic MSK: Knee: Bilateral valgus deformity noted. Large thigh to calf ratio.  Tender to palpation  over medial and PF joint line.  ROM full in flexion and extension and lower leg rotation. instability with  valgus force.  painful patellar compression. Patellar glide with moderate crepitus. Patellar and quadriceps tendons unremarkable. Hamstring and quadriceps strength is normal.  After informed written and verbal consent, patient was seated on exam table. Right knee was prepped with alcohol swab and utilizing anterolateral approach, patient's right knee space was injected with 4:1  marcaine 0.5%: Kenalog 22m/dL. Patient tolerated the procedure well without immediate complications.  After informed written and verbal consent, patient was seated on exam table. Left knee was prepped with alcohol swab and utilizing anterolateral approach, patient's left knee space was injected with 4:1  marcaine 0.5%: Kenalog 452mdL. Patient tolerated the procedure well without immediate complications.   Impression and Recommendations:     This case required medical decision making of moderate complexity. The above documentation has been reviewed and is accurate and complete ZaLyndal PulleyDO       Note: This dictation was prepared with Dragon dictation along with smaller phrase technology. Any transcriptional errors that result from this process are unintentional.

## 2019-05-02 NOTE — Patient Instructions (Addendum)
Injected both knees today See me again in 10 weeks

## 2019-05-17 ENCOUNTER — Ambulatory Visit (INDEPENDENT_AMBULATORY_CARE_PROVIDER_SITE_OTHER): Payer: Medicare Other | Admitting: Family Medicine

## 2019-05-17 ENCOUNTER — Other Ambulatory Visit: Payer: Self-pay

## 2019-05-17 ENCOUNTER — Encounter (INDEPENDENT_AMBULATORY_CARE_PROVIDER_SITE_OTHER): Payer: Self-pay | Admitting: Family Medicine

## 2019-05-17 VITALS — BP 115/68 | HR 60 | Temp 98.2°F | Ht 65.0 in | Wt 201.0 lb

## 2019-05-17 DIAGNOSIS — Z6833 Body mass index (BMI) 33.0-33.9, adult: Secondary | ICD-10-CM | POA: Diagnosis not present

## 2019-05-17 DIAGNOSIS — E7849 Other hyperlipidemia: Secondary | ICD-10-CM | POA: Diagnosis not present

## 2019-05-17 DIAGNOSIS — E669 Obesity, unspecified: Secondary | ICD-10-CM

## 2019-05-17 DIAGNOSIS — F3289 Other specified depressive episodes: Secondary | ICD-10-CM

## 2019-05-18 NOTE — Progress Notes (Signed)
Chief Complaint:   OBESITY Heather [name] is here to discuss her progress with her obesity treatment plan along with follow-up of her obesity related diagnoses. [name] is on the Category 2 Plan and states she is following her eating plan approximately 99% of the time. [name] states she is on the elliptical for 30+ minutes 4+ times per week.  Today's visit was #: 79 Starting weight: 268 lbs Starting date: 12/23/2016 Today's weight: 201 lbs Today's date: 05/17/2019 Total lbs lost to date: 77 Total lbs lost since last in-office visit: 7  Interim History: [name] states "im having a great time" she has received both COVID-19 vaccines. She has resumed Category 2 for the last 29 days and she has been tracking her intake via journaling. She has been exercising on her elliptical machine most mornings of the week. She has been experimenting with healthy recipes.  Subjective:   1. Other hyperlipidemia [name] is currently on rosuvastatin 40 mg q daily. She denies side effects.  2. Other depression, with emotional eating  [name] reports stable mood, and she is currently on venlafaxine 37.5 mg q daily.  Assessment/Plan:   1. Other hyperlipidemia Cardiovascular risk and specific lipid/LDL goals reviewed. We discussed several lifestyle modifications today and [name] will continue to work on diet, exercise and weight loss efforts. [name] agreed to continue her medications as is. Orders and follow up as documented in patient record.   2. Other depression, with emotional eating  Behavior modification techniques were discussed today to help [name] deal with her emotional/non-hunger eating behaviors. Heath agreed to continue her current SNRI regimen. Orders and follow up as documented in patient record.   3. Class 1 obesity with serious comorbidity and body mass index (BMI) of 33.0 to 33.9 in adult, unspecified obesity type [name] is currently in the action stage of change. As such, her goal is to continue with  weight loss efforts. She has agreed to the Category 2 Plan.   Exercise goals: As is.  Behavioral modification strategies: meal planning and cooking strategies and ways to avoid boredom eating.  [name] has agreed to follow-up with our clinic in 3 to 4 weeks. She was informed of the importance of frequent follow-up visits to maximize her success with intensive lifestyle modifications for her multiple health conditions.   Objective:   Blood pressure 115/68, pulse 60, temperature 98.2 F (36.8 C), temperature source Oral, height 5' 5"  (1.651 m), weight 201 lb (91.2 kg), SpO2 96 %. Body mass index is 33.45 kg/m.  General: Cooperative, alert, well developed, in no acute distress. HEENT: Conjunctivae and lids unremarkable. Cardiovascular: Regular rhythm.  Lungs: Normal work of breathing. Neurologic: No focal deficits.   Lab Results  Component Value Date   CREATININE 0.84 12/05/2018   BUN 22 12/05/2018   NA 141 12/05/2018   K 4.1 12/05/2018   CL 106 12/05/2018   CO2 25 12/05/2018   Lab Results  Component Value Date   ALT 14 12/05/2018   AST 19 12/05/2018   ALKPHOS 65 12/05/2018   BILITOT <0.2 12/05/2018   Lab Results  Component Value Date   HGBA1C 5.6 12/05/2018   HGBA1C 5.6 04/27/2018   HGBA1C 6.0 (H) 12/01/2017   HGBA1C 5.7 (H) 07/29/2017   HGBA1C 5.9 03/15/2017   Lab Results  Component Value Date   INSULIN 6.4 12/05/2018   INSULIN 3.6 04/27/2018   INSULIN 5.6 12/01/2017   INSULIN 6.8 07/29/2017   INSULIN 20.9 12/23/2016   Lab Results  Component Value  Date   TSH 0.93 04/27/2019   Lab Results  Component Value Date   CHOL 178 12/05/2018   HDL 77 12/05/2018   LDLCALC 89 12/05/2018   TRIG 65 12/05/2018   CHOLHDL 3 03/05/2016   Lab Results  Component Value Date   WBC 3.4 (L) 04/27/2019   HGB 12.8 04/27/2019   HCT 38.3 04/27/2019   MCV 93.6 04/27/2019   PLT 192.0 04/27/2019   Lab Results  Component Value Date   IRON 43 (L) 04/26/2018   TIBC 249 (L)  04/26/2018   FERRITIN 60 04/26/2018   Attestation Statements:   Reviewed by clinician on day of visit: allergies, medications, problem list, medical history, surgical history, family history, social history, and previous encounter notes.  Time spent on visit including pre-visit chart review and post-visit care and charting was 30 minutes.    I, [name], am acting as transcriptionist for [name], MD.  I have reviewed the above documentation for accuracy and completeness, and I agree with the above. -  [name], MD

## 2019-05-25 ENCOUNTER — Ambulatory Visit (INDEPENDENT_AMBULATORY_CARE_PROVIDER_SITE_OTHER): Payer: Medicare Other | Admitting: Podiatry

## 2019-05-25 ENCOUNTER — Other Ambulatory Visit: Payer: Self-pay

## 2019-05-25 ENCOUNTER — Encounter: Payer: Self-pay | Admitting: Podiatry

## 2019-05-25 DIAGNOSIS — M79676 Pain in unspecified toe(s): Secondary | ICD-10-CM | POA: Diagnosis not present

## 2019-05-25 DIAGNOSIS — Q828 Other specified congenital malformations of skin: Secondary | ICD-10-CM | POA: Diagnosis not present

## 2019-05-25 DIAGNOSIS — B351 Tinea unguium: Secondary | ICD-10-CM | POA: Diagnosis not present

## 2019-05-25 NOTE — Progress Notes (Signed)
She presents today chief complaint of painfully elongated toenails 1 through 5 bilaterally.  Objective: Vital signs are stable she alert oriented x3 pulses are palpable.  Toenails are long thick yellow dystrophic-like mycotic with hyperkeratotic lesions plantarly.  Assessment: Pain in limb secondary to onychomycosis and porokeratotic lesions bilaterally.  Plan: Debrided toenails 1 through 5 bilateral debrided all reactive hyperkeratoses.

## 2019-05-26 DIAGNOSIS — K449 Diaphragmatic hernia without obstruction or gangrene: Secondary | ICD-10-CM | POA: Diagnosis not present

## 2019-05-30 ENCOUNTER — Other Ambulatory Visit: Payer: Self-pay | Admitting: Surgery

## 2019-05-30 DIAGNOSIS — K449 Diaphragmatic hernia without obstruction or gangrene: Secondary | ICD-10-CM

## 2019-06-07 ENCOUNTER — Encounter (INDEPENDENT_AMBULATORY_CARE_PROVIDER_SITE_OTHER): Payer: Self-pay | Admitting: Family Medicine

## 2019-06-07 ENCOUNTER — Ambulatory Visit (INDEPENDENT_AMBULATORY_CARE_PROVIDER_SITE_OTHER): Payer: Medicare Other | Admitting: Family Medicine

## 2019-06-07 ENCOUNTER — Other Ambulatory Visit: Payer: Self-pay

## 2019-06-07 VITALS — BP 104/66 | HR 67 | Temp 98.0°F | Ht 65.0 in | Wt 202.0 lb

## 2019-06-07 DIAGNOSIS — E119 Type 2 diabetes mellitus without complications: Secondary | ICD-10-CM

## 2019-06-07 DIAGNOSIS — E559 Vitamin D deficiency, unspecified: Secondary | ICD-10-CM

## 2019-06-07 DIAGNOSIS — E1169 Type 2 diabetes mellitus with other specified complication: Secondary | ICD-10-CM | POA: Diagnosis not present

## 2019-06-07 DIAGNOSIS — E669 Obesity, unspecified: Secondary | ICD-10-CM | POA: Diagnosis not present

## 2019-06-07 DIAGNOSIS — E785 Hyperlipidemia, unspecified: Secondary | ICD-10-CM | POA: Diagnosis not present

## 2019-06-07 DIAGNOSIS — Z6833 Body mass index (BMI) 33.0-33.9, adult: Secondary | ICD-10-CM

## 2019-06-07 MED ORDER — VITAMIN D (ERGOCALCIFEROL) 1.25 MG (50000 UNIT) PO CAPS: 50000.0000 [IU] | ORAL_CAPSULE | ORAL | 0 refills | Status: DC

## 2019-06-07 NOTE — Progress Notes (Signed)
Chief Complaint:   OBESITY Heather [name] is here to discuss her progress with her obesity treatment plan along with follow-up of her obesity related diagnoses. [name] is on the Category 2 Plan and states she is following her eating plan approximately 75% of the time. [name] states she is on the elliptical for 30 minutes 2-3 times per week.  Today's visit was #: 15 Starting weight: 268 lbs Starting date: 12/23/2016 Today's weight: 202 lbs Today's date: 06/07/2019 Total lbs lost to date: 5 Total lbs lost since last in-office visit: 0  Interim History: Heather [name] enjoys the elliptical and she states it is eases her arthritic pain. She feels deprived of her sweet treats. She doesn't feel deprived when she looks in the mirror. She states "197 lbs would be a triumph".  Subjective:   1. Vitamin D deficiency Heather [name]'s Vit D level on 12/05/2018 was 35.1. She is taking OTC Vit D supplementation. I recommended prescription strength Vit D supplementation.  2. Type 2 diabetes mellitus without complication, without long-term current use of insulin (HCC) Heather [name]'s A1c on 12/05/2018 was 5.6, and is diet controlled.  3. Hyperlipidemia associated with type 2 diabetes mellitus ([place]) Heather [name]'s lipid panel on 12/05/2018 showed a total cholesterol of 178, triglycerides 65, HDL 77, and LDL 89. She is currently on rosuvastatin 40 mg q daily, and is tolerating it well.  Assessment/Plan:   1. Vitamin D deficiency Low Vitamin D level contributes to fatigue and are associated with obesity, breast, and colon cancer. Heather [name] agreed to start prescription Vitamin D 50,000 IU every week with no refills. She will follow-up for routine testing of Vitamin D, at least 2-3 times per year to avoid over-replacement. We will recheck labs in 3 months.  - Vitamin D, Ergocalciferol, (DRISDOL) 1.25 MG (50000 UNIT) CAPS capsule; Take 1 capsule (50,000 Units total) by mouth every 7 (seven) days.  Dispense: 4 capsule; Refill: 0  2. Type 2 diabetes  mellitus without complication, without long-term current use of insulin ([name]) Modelle will start her Low carbohydrate meal plan. Good blood sugar control is important to decrease the likelihood of diabetic complications such as nephropathy, neuropathy, limb loss, blindness, coronary artery disease, and death. Intensive lifestyle modification including diet, exercise and weight loss are the first line of treatment for diabetes. We will recheck labs at her office visit.  3. Hyperlipidemia associated with type 2 diabetes mellitus ([place]) Cardiovascular risk and specific lipid/LDL goals reviewed.  We discussed several lifestyle modifications today. Kelie will start her Low carbohydrate meal plan, and will continue to work on diet, exercise and weight loss efforts. We will recheck labs at her next office visit. Orders and follow up as documented in patient record.   4. Class 1 obesity with serious comorbidity and body mass index (BMI) of 33.0 to 33.9 in adult, unspecified obesity type Heather [name] is currently in the action stage of change. As such, her goal is to continue with weight loss efforts. She has agreed to change to following a lower carbohydrate, vegetable and lean protein rich diet plan.   Exercise goals: As is.  Behavioral modification strategies: increasing lean protein intake and decreasing simple carbohydrates.  Heather [name] has agreed to follow-up with our clinic in 3 weeks. She was informed of the importance of frequent follow-up visits to maximize her success with intensive lifestyle modifications for her multiple health conditions.   Objective:   Blood pressure 104/66, pulse 67, temperature 98 F (36.7 C), temperature source Oral, height 5' 5"  (1.651 m), weight  202 lb (91.6 kg), SpO2 99 %. Body mass index is 33.61 kg/m.  General: Cooperative, alert, well developed, in no acute distress. HEENT: Conjunctivae and lids unremarkable. Cardiovascular: Regular rhythm.  Lungs: Normal work of  breathing. Neurologic: No focal deficits.   Lab Results  Component Value Date   CREATININE 0.84 12/05/2018   BUN 22 12/05/2018   NA 141 12/05/2018   K 4.1 12/05/2018   CL 106 12/05/2018   CO2 25 12/05/2018   Lab Results  Component Value Date   ALT 14 12/05/2018   AST 19 12/05/2018   ALKPHOS 65 12/05/2018   BILITOT <0.2 12/05/2018   Lab Results  Component Value Date   HGBA1C 5.6 12/05/2018   HGBA1C 5.6 04/27/2018   HGBA1C 6.0 (H) 12/01/2017   HGBA1C 5.7 (H) 07/29/2017   HGBA1C 5.9 03/15/2017   Lab Results  Component Value Date   INSULIN 6.4 12/05/2018   INSULIN 3.6 04/27/2018   INSULIN 5.6 12/01/2017   INSULIN 6.8 07/29/2017   INSULIN 20.9 12/23/2016   Lab Results  Component Value Date   TSH 0.93 04/27/2019   Lab Results  Component Value Date   CHOL 178 12/05/2018   HDL 77 12/05/2018   LDLCALC 89 12/05/2018   TRIG 65 12/05/2018   CHOLHDL 3 03/05/2016   Lab Results  Component Value Date   WBC 3.4 (L) 04/27/2019   HGB 12.8 04/27/2019   HCT 38.3 04/27/2019   MCV 93.6 04/27/2019   PLT 192.0 04/27/2019   Lab Results  Component Value Date   IRON 43 (L) 04/26/2018   TIBC 249 (L) 04/26/2018   FERRITIN 60 04/26/2018   Attestation Statements:   Reviewed by clinician on day of visit: allergies, medications, problem list, medical history, surgical history, family history, social history, and previous encounter notes.   I, [name], am acting as transcriptionist for [name], MD.  I have reviewed the above documentation for accuracy and completeness, and I agree with the above. -  [name], MD

## 2019-06-08 ENCOUNTER — Ambulatory Visit
Admission: RE | Admit: 2019-06-08 | Discharge: 2019-06-08 | Disposition: A | Discharge status: Home / Self Care | Payer: Medicare Other | Source: Ambulatory Visit | Attending: Surgery | Admitting: Surgery

## 2019-06-08 DIAGNOSIS — K449 Diaphragmatic hernia without obstruction or gangrene: Secondary | ICD-10-CM | POA: Diagnosis not present

## 2019-06-30 ENCOUNTER — Encounter: Payer: Self-pay | Admitting: Neurology

## 2019-07-03 DIAGNOSIS — Z6835 Body mass index (BMI) 35.0-35.9, adult: Secondary | ICD-10-CM | POA: Diagnosis not present

## 2019-07-03 DIAGNOSIS — Z01419 Encounter for gynecological examination (general) (routine) without abnormal findings: Secondary | ICD-10-CM | POA: Diagnosis not present

## 2019-07-03 DIAGNOSIS — R921 Mammographic calcification found on diagnostic imaging of breast: Secondary | ICD-10-CM | POA: Diagnosis not present

## 2019-07-03 LAB — HM MAMMOGRAPHY

## 2019-07-05 ENCOUNTER — Encounter (INDEPENDENT_AMBULATORY_CARE_PROVIDER_SITE_OTHER): Payer: Self-pay | Admitting: Family Medicine

## 2019-07-05 ENCOUNTER — Other Ambulatory Visit: Payer: Self-pay

## 2019-07-05 ENCOUNTER — Ambulatory Visit (INDEPENDENT_AMBULATORY_CARE_PROVIDER_SITE_OTHER): Payer: Medicare Other | Admitting: Family Medicine

## 2019-07-05 VITALS — BP 110/64 | HR 57 | Temp 98.1°F | Ht 65.0 in | Wt 203.0 lb

## 2019-07-05 DIAGNOSIS — E669 Obesity, unspecified: Secondary | ICD-10-CM

## 2019-07-05 DIAGNOSIS — E559 Vitamin D deficiency, unspecified: Secondary | ICD-10-CM | POA: Diagnosis not present

## 2019-07-05 DIAGNOSIS — Z6833 Body mass index (BMI) 33.0-33.9, adult: Secondary | ICD-10-CM

## 2019-07-05 DIAGNOSIS — E119 Type 2 diabetes mellitus without complications: Secondary | ICD-10-CM

## 2019-07-05 MED ORDER — VITAMIN D (ERGOCALCIFEROL) 1.25 MG (50000 UNIT) PO CAPS: 50000.0000 [IU] | ORAL_CAPSULE | ORAL | 0 refills | Status: DC

## 2019-07-05 NOTE — Progress Notes (Signed)
Chief Complaint:   OBESITY Nahara is here to discuss her progress with her obesity treatment plan along with follow-up of her obesity related diagnoses. Anivea is on following a lower carbohydrate, vegetable and lean protein rich diet plan and states she is following her eating plan approximately 90% of the time. Ameisha states she is on the elliptical for 30-60 minutes 2-3 times per week.  Today's visit was #: 63 Starting weight: 268 lbs Starting date: 12/23/2016 Today's weight: 203 lbs Today's date: 07/05/2019 Total lbs lost to date: 56 Total lbs lost since last in-office visit: 0  Interim History: Brettany states she has been eating a lot better and she feels just great. She does admit to missing comfort food from her childhood. I encouraged her to use snack calories to enjoy "treats".  Subjective:   1. Vitamin D deficiency Juelz's last Vit D level on 12/05/2018 was 35.1.  2. Type 2 diabetes mellitus without complication, without long-term current use of insulin ([place]) Maika's diabetes mellitus is diet controlled. Her last A1c on 12/05/2018 was 5.6 and insulin level was 6.4.  Assessment/Plan:   1. Vitamin D deficiency Low Vitamin D level contributes to fatigue and are associated with obesity, breast, and colon cancer. We will refill prescription Vitamin D for 1 month. Dawanna will follow-up for routine testing of Vitamin D, at least 2-3 times per year to avoid over-replacement.  - Vitamin D, Ergocalciferol, (DRISDOL) 1.25 MG (50000 UNIT) CAPS capsule; Take 1 capsule (50,000 Units total) by mouth every 7 (seven) days.  Dispense: 4 capsule; Refill: 0  2. Type 2 diabetes mellitus without complication, without long-term current use of insulin (HCC) Good blood sugar control is important to decrease the likelihood of diabetic complications such as nephropathy, neuropathy, limb loss, blindness, coronary artery disease, and death. Intensive lifestyle modification including diet, exercise and  weight loss are the first line of treatment for diabetes. Ravina will continue her Category 2 meal plan, and we will check labs today.  3. Class 1 obesity with serious comorbidity and body mass index (BMI) of 33.0 to 33.9 in adult, unspecified obesity type Darian is currently in the action stage of change. As such, her goal is to continue with weight loss efforts. She has agreed to the Category 2 Plan with additional breakfast options.   Exercise goals: As is.  Behavioral modification strategies: increasing lean protein intake, no skipping meals, meal planning and cooking strategies and better snacking choices.  Ayomide has agreed to follow-up with our clinic in 3 to 4 weeks. She was informed of the importance of frequent follow-up visits to maximize her success with intensive lifestyle modifications for her multiple health conditions.   Objective:   Blood pressure 110/64, pulse (!) 57, temperature 98.1 F (36.7 C), temperature source Oral, height 5' 5"  (1.651 m), weight 203 lb (92.1 kg), SpO2 99 %. Body mass index is 33.78 kg/m.  General: Cooperative, alert, well developed, in no acute distress. HEENT: Conjunctivae and lids unremarkable. Cardiovascular: Regular rhythm.  Lungs: Normal work of breathing. Neurologic: No focal deficits.   Lab Results  Component Value Date   CREATININE 0.84 12/05/2018   BUN 22 12/05/2018   NA 141 12/05/2018   K 4.1 12/05/2018   CL 106 12/05/2018   CO2 25 12/05/2018   Lab Results  Component Value Date   ALT 14 12/05/2018   AST 19 12/05/2018   ALKPHOS 65 12/05/2018   BILITOT <0.2 12/05/2018   Lab Results  Component Value Date  HGBA1C 5.6 12/05/2018   HGBA1C 5.6 04/27/2018   HGBA1C 6.0 (H) 12/01/2017   HGBA1C 5.7 (H) 07/29/2017   HGBA1C 5.9 03/15/2017   Lab Results  Component Value Date   INSULIN 6.4 12/05/2018   INSULIN 3.6 04/27/2018   INSULIN 5.6 12/01/2017   INSULIN 6.8 07/29/2017   INSULIN 20.9 12/23/2016   Lab Results  Component  Value Date   TSH 0.93 04/27/2019   Lab Results  Component Value Date   CHOL 178 12/05/2018   HDL 77 12/05/2018   LDLCALC 89 12/05/2018   TRIG 65 12/05/2018   CHOLHDL 3 03/05/2016   Lab Results  Component Value Date   WBC 3.4 (L) 04/27/2019   HGB 12.8 04/27/2019   HCT 38.3 04/27/2019   MCV 93.6 04/27/2019   PLT 192.0 04/27/2019   Lab Results  Component Value Date   IRON 43 (L) 04/26/2018   TIBC 249 (L) 04/26/2018   FERRITIN 60 04/26/2018    Obesity Behavioral Intervention Documentation for Insurance:   Approximately 15 minutes were spent on the discussion below.  ASK: We discussed the diagnosis of obesity with Jari today and Anaysha agreed to give [name] permission to discuss obesity behavioral modification therapy today.  ASSESS: Alexsandra has the diagnosis of obesity and her BMI today is 33.78. Berneta is in the action stage of change.   ADVISE: Allyn was educated on the multiple health risks of obesity as well as the benefit of weight loss to improve her health. She was advised of the need for long term treatment and the importance of lifestyle modifications to improve her current health and to decrease her risk of future health problems.  AGREE: Multiple dietary modification options and treatment options were discussed and Jackye agreed to follow the recommendations documented in the above note.  ARRANGE: Doreather was educated on the importance of frequent visits to treat obesity as outlined per CMS and USPSTF guidelines and agreed to schedule her next follow up appointment today.  Attestation Statements:   Reviewed by clinician on day of visit: allergies, medications, problem list, medical history, surgical history, family history, social history, and previous encounter notes.   I, [name], am acting as transcriptionist for [name], MD.  I have reviewed the above documentation for accuracy and completeness, and I agree with the above. -  [name], MD

## 2019-07-06 LAB — COMPREHENSIVE METABOLIC PANEL
ALT: 10 IU/L (ref 0–32)
AST: 18 IU/L (ref 0–40)
Albumin/Globulin Ratio: 1.7 (ref 1.2–2.2)
Albumin: 4.3 g/dL (ref 3.7–4.7)
Alkaline Phosphatase: 67 IU/L (ref 39–117)
BUN/Creatinine Ratio: 23 (ref 12–28)
BUN: 19 mg/dL (ref 8–27)
Bilirubin Total: 0.2 mg/dL (ref 0.0–1.2)
CO2: 24 mmol/L (ref 20–29)
Calcium: 9.3 mg/dL (ref 8.7–10.3)
Chloride: 110 mmol/L — ABNORMAL HIGH (ref 96–106)
Creatinine, Ser: 0.82 mg/dL (ref 0.57–1.00)
GFR calc Af Amer: 83 mL/min/{1.73_m2} (ref >59)
GFR calc non Af Amer: 72 mL/min/{1.73_m2} (ref >59)
Globulin, Total: 2.6 g/dL (ref 1.5–4.5)
Glucose: 84 mg/dL (ref 65–99)
Potassium: 4.1 mmol/L (ref 3.5–5.2)
Sodium: 146 mmol/L — ABNORMAL HIGH (ref 134–144)
Total Protein: 6.9 g/dL (ref 6.0–8.5)

## 2019-07-06 LAB — CBC WITH DIFFERENTIAL/PLATELET
Basophils Absolute: 0 10*3/uL (ref 0.0–0.2)
Basos: 1 %
EOS (ABSOLUTE): 0.1 10*3/uL (ref 0.0–0.4)
Eos: 3 %
Hematocrit: 41.1 % (ref 34.0–46.6)
Hemoglobin: 13.2 g/dL (ref 11.1–15.9)
Immature Grans (Abs): 0 10*3/uL (ref 0.0–0.1)
Immature Granulocytes: 0 %
Lymphocytes Absolute: 1 10*3/uL (ref 0.7–3.1)
Lymphs: 36 %
MCH: 30.2 pg (ref 26.6–33.0)
MCHC: 32.1 g/dL (ref 31.5–35.7)
MCV: 94 fL (ref 79–97)
Monocytes Absolute: 0.3 10*3/uL (ref 0.1–0.9)
Monocytes: 10 %
Neutrophils Absolute: 1.4 10*3/uL (ref 1.4–7.0)
Neutrophils: 50 %
Platelets: 203 10*3/uL (ref 150–450)
RBC: 4.37 x10E6/uL (ref 3.77–5.28)
RDW: 14.5 % (ref 11.7–15.4)
WBC: 2.8 10*3/uL — ABNORMAL LOW (ref 3.4–10.8)

## 2019-07-06 LAB — VITAMIN D 25 HYDROXY (VIT D DEFICIENCY, FRACTURES): Vit D, 25-Hydroxy: 45.1 ng/mL (ref 30.0–100.0)

## 2019-07-06 LAB — HEMOGLOBIN A1C
Est. average glucose Bld gHb Est-mCnc: 120 mg/dL
Hgb A1c MFr Bld: 5.8 % — ABNORMAL HIGH (ref 4.8–5.6)

## 2019-07-06 LAB — LIPID PANEL WITH LDL/HDL RATIO
Cholesterol, Total: 186 mg/dL (ref 100–199)
HDL: 74 mg/dL (ref >39)
LDL Chol Calc (NIH): 100 mg/dL — ABNORMAL HIGH (ref 0–99)
LDL/HDL Ratio: 1.4 ratio (ref 0.0–3.2)
Triglycerides: 64 mg/dL (ref 0–149)
VLDL Cholesterol Cal: 12 mg/dL (ref 5–40)

## 2019-07-06 LAB — INSULIN, RANDOM: INSULIN: 8.6 u[IU]/mL (ref 2.6–24.9)

## 2019-07-11 ENCOUNTER — Ambulatory Visit (INDEPENDENT_AMBULATORY_CARE_PROVIDER_SITE_OTHER): Payer: Medicare Other | Admitting: Family Medicine

## 2019-07-11 ENCOUNTER — Other Ambulatory Visit: Payer: Self-pay

## 2019-07-11 ENCOUNTER — Encounter: Payer: Self-pay | Admitting: Family Medicine

## 2019-07-11 DIAGNOSIS — M170 Bilateral primary osteoarthritis of knee: Secondary | ICD-10-CM | POA: Diagnosis not present

## 2019-07-11 NOTE — Patient Instructions (Signed)
Injected both knees today See me again in 7 weeks

## 2019-07-11 NOTE — Assessment & Plan Note (Signed)
Bilateral injections given again today.  8 to 10 weeks we will continue.  Chronic problem with exacerbation.  Topical anti-inflammatories given, Tylenol.  Follow-up again in  8 weeks

## 2019-07-11 NOTE — Progress Notes (Signed)
[facility] [place] Phone: [phone] Subjective:   [name], am serving as a scribe for Dr. Hulan Saas. This visit occurred during the SARS-CoV-2 public health emergency.  Safety protocols were in place, including screening questions prior to the visit, additional usage of staff PPE, and extensive cleaning of exam room while observing appropriate contact time as indicated for disinfecting solutions.    I'm seeing this patient by the request  of:  McLean-Scocuzza, [name], MD  CC: Bilateral knee pain  BSJ:[id number]   05/02/2019 Repeat injection given today, tolerated the procedure well, discussed with home exercise, discussed importance of activity.  This is a chronic problem with mild exacerbation.  Encourage bracing.  Social determinants of health include patient's other difficulties to keep her from working out as well as transportation and walking on regular basis.  Patient will consider formal physical therapy, encourage weight loss.  Follow-up again in 10 weeks  Update 07/11/2019 Heather [name] is a 74 y.o. female coming in with complaint of bilateral knee pain. Patient states that her knee pain has been increasing and is worse than usual. Feels like her legs are weak. Stair climbing is hard for patient. Is here for injections.  Patient states that it is starting to affect her daily activities especially going up and down stairs on a regular basis.    Past Medical History:  Diagnosis Date  . Abdominal hernia   . Abnormal Pap smear   . ALLERGIC RHINITIS 10/22/2006  . Allergy   . ANEMIA 12/18/2008  . Arthritis    scoliosis moderate deg changes lumbar Xray 11/04/17   . ASYMPTOMATIC POSTMENOPAUSAL STATUS 11/22/2007  . Back pain   . CAD (coronary artery disease)    in LAD  . Complication of anesthesia   . Degenerative arthritis   . DIABETES MELLITUS, TYPE II 01/13/2007  . Diverticulosis   . Dyslipidemia   .  Fibroid   . Frequent headaches   . GERD 07/20/2007  . GOITER, MULTINODULAR 07/20/2007  . Headache(784.0) 07/20/2007  . HEARING LOSS 11/22/2007  . Hiatal hernia   . Hiatal hernia    large  . History of chicken pox   . Hx of colposcopy with cervical biopsy   . HYPERCHOLESTEROLEMIA 01/13/2007  . Hyperglycemia   . HYPERTENSION 10/22/2006  . Lung nodule    unchanged since 05/26/13   . Migraines   . NASH (nonalcoholic steatohepatitis)   . Nocturnal hypoxemia 02/17/2013  . Obesity   . Obstructive sleep apnea   . OSA on CPAP    per neurology  . OSTEOARTHRITIS 10/22/2006  . Other chronic nonalcoholic liver disease 6/54/6503  . Sedimentation rate elevation   . Sleep apnea    on cpap  . Urine incontinence   . UTI (urinary tract infection)    Past Surgical History:  Procedure Laterality Date  . BREAST SURGERY  1984   Breast reduction b/l   . CATARACT EXTRACTION, BILATERAL    . DEXA  08/2005  . DILATION AND CURETTAGE OF UTERUS    . ELECTROCARDIOGRAM  10/15/2006  . ESOPHAGOGASTRODUODENOSCOPY  12/08/2005  . FOOT SURGERY     hammertoe and bunion 05/2017   . Stress Cardiolite  10/21/2005  . sweat gland removal    . WISDOM TOOTH EXTRACTION     Social History   Socioeconomic History  . Marital status: Married    Spouse name: Heather [name]  . Number of children: 2  .  Years of education: College  . Highest education level: Not on file  Occupational History  . Occupation: Retired  Tobacco Use  . Smoking status: Former Smoker    Packs/day: 2.00    Years: 20.00    Pack years: 40.00    Types: Cigarettes    Quit date: 03/03/1979    Years since quitting: 40.3  . Smokeless tobacco: Never Used  Substance and Sexual Activity  . Alcohol use: Yes    Comment: rare  . Drug use: No  . Sexual activity: Yes    Birth control/protection: Surgical  Other Topics Concern  . Not on file  Social History Narrative   Patient is married Heather [name]).   Patient has two children; 3 pregnancies 2 live births     Patient is retired Designer, jewellery, Chiropodist    Patient has a Financial risk analyst.   Patient is right-handed.   Patient lives at home with family.   Caffeine Use: 2 soda every other day   Former smoker 20+ years ago 2 ppd quit in 45s   No guns, wears seat belt, safe in relationship    Social Determinants of Health   Financial Resource Strain: Low Risk   . Difficulty of Paying Living Expenses: Not hard at all  Food Insecurity: No Food Insecurity  . Worried About Charity fundraiser in the Last Year: Never true  . Ran Out of Food in the Last Year: Never true  Transportation Needs: No Transportation Needs  . Lack of Transportation (Medical): No  . Lack of Transportation (Non-Medical): No  Physical Activity: Sufficiently Active  . Days of Exercise per Week: 4 days  . Minutes of Exercise per Session: 60 min  Stress: No Stress Concern Present  . Feeling of Stress : Not at all  Social Connections:   . Frequency of Communication with Friends and Family:   . Frequency of Social Gatherings with Friends and Family:   . Attends Religious Services:   . Active Member of Clubs or Organizations:   . Attends Archivist Meetings:   [name] Marital Status:    Allergies  Allergen Reactions  . Aspirin   . Coconut Oil   . Lisinopril     REACTION: cough  . Metoprolol Itching  . Peanut-Containing Drug Products   . Penicillins     REACTION: rash  . Prednisone   . Strawberry Extract    Family History  Problem Relation Age of Onset  . Asthma Mother   . Depression Mother   . Bipolar disorder Mother   . Dementia Mother   . Arthritis Mother   . Breast cancer Mother        primary  . Colon cancer Mother        mets from breast  . Cancer Mother        colon  . Hypertension Father   . Migraines Father   . Cancer Maternal Aunt        ?  [name] Heart disease Maternal Grandmother   . Cancer Maternal Grandfather        stomach ?      Current  Outpatient Medications (Cardiovascular):  .  furosemide (LASIX) 40 MG tablet, Take 1 tablet (40 mg total) by mouth daily as needed. .  rosuvastatin (CRESTOR) 40 MG tablet, Take 1 tablet (40 mg total) by mouth daily. At night   Current Outpatient Medications (Respiratory):  .  cetirizine (ZYRTEC) 10 MG tablet, Take 10  mg by mouth daily. [name]  ipratropium (ATROVENT) 0.06 % nasal spray, Place 2 sprays into both nostrils 4 (four) times daily.   Current Outpatient Medications (Analgesics):  .  acetaminophen (TYLENOL) 500 MG tablet, Take 650 mg by mouth 3 (three) times daily as needed for pain.  .  naproxen sodium (ANAPROX) 220 MG tablet, Take 220 mg by mouth as needed. .  SUMAtriptan (IMITREX) 20 MG/ACT nasal spray, sumatriptan 20 mg/actuation nasal spray   Current Outpatient Medications (Hematological):  .  clopidogrel (PLAVIX) 75 MG tablet, TAKE 1 TABLET DAILY .  Cyanocobalamin 1000 MCG CAPS, Take 1 capsule by mouth daily. .  Ferrous Sulfate (IRON) 325 (65 FE) MG TABS, Take 1 tablet by mouth daily.     Current Outpatient Medications (Other):  [name]  Calcium Carbonate-Vitamin D (CALCIUM 600 + D PO), Take 2 tablets by mouth daily.   [name]  gabapentin (NEURONTIN) 100 MG capsule, Take 2 capsules (200 mg total) by mouth at bedtime. [name]  glucosamine-chondroitin 500-400 MG tablet, Take 1 tablet by mouth 2 (two) times daily.   .  Multiple Vitamin (MULTIVITAMIN) tablet, Take 1 tablet by mouth daily.   .  Omega-3 Fatty Acids (FISH OIL) 1000 MG CAPS, Take 1 capsule by mouth daily. .  pantoprazole (PROTONIX) 20 MG tablet, Take 1 tablet (20 mg total) by mouth daily. 30 minutes before food .  polyethylene glycol (MIRALAX / GLYCOLAX) 17 g packet, Take 17 g by mouth daily as needed. .  potassium chloride SA (KLOR-CON M20) 20 MEQ tablet, Take 1 tablet (20 mEq total) by mouth daily. [name]  pyridOXINE (VITAMIN B-6) 100 MG tablet, Take 200 mg by mouth daily. .  SF 5000 PLUS 1.1 % CREA dental cream,  .  topiramate (TOPAMAX)  100 MG tablet, Take 1 tablet (100 mg total) by mouth 2 (two) times daily. .  Turmeric 500 MG TABS, Take 2 tablets by mouth daily. [name]  venlafaxine XR (EFFEXOR XR) 37.5 MG 24 hr capsule, Take 1 capsule (37.5 mg total) by mouth daily with breakfast. .  Vitamin D, Ergocalciferol, (DRISDOL) 1.25 MG (50000 UNIT) CAPS capsule, Take 1 capsule (50,000 Units total) by mouth every 7 (seven) days. .  vitamin E 200 UNIT capsule, Take 200 Units by mouth daily.  Current Facility-Administered Medications (Other):  .  0.9 %  sodium chloride infusion   Reviewed prior external information including notes and imaging from  primary care provider As well as notes that were available from care everywhere and other healthcare systems.  Past medical history, social, surgical and family history all reviewed in electronic medical record.  No pertanent information unless stated regarding to the chief complaint.   Review of Systems:  No headache, visual changes, nausea, vomiting, diarrhea, constipation, dizziness, abdominal pain, skin rash, fevers, chills, night sweats, weight loss, swollen lymph nodes, body aches, joint swelling, chest pain, shortness of breath, mood changes. POSITIVE muscle aches  Objective  Blood pressure 110/62, pulse 67, height 5' 5"  (1.651 m), weight 211 lb (95.7 kg), SpO2 98 %.   General: No apparent distress alert and oriented x3 mood and affect normal, dressed appropriately.  HEENT: Pupils equal, extraocular movements intact  Respiratory: Patient's speak in full sentences and does not appear short of breath  Cardiovascular: No lower extremity edema, non tender, no erythema  Neuro: Cranial nerves II through XII are intact, neurovascularly intact in all extremities with 2+ DTRs and 2+ pulses.  Gait severely antalgic Knee: Bilateral valgus deformity noted.  Abnormal thigh to  calf ratio.  Tender to palpation over medial and PF joint line.  ROM full in flexion and extension and lower leg  rotation. instability with valgus force.  painful patellar compression. Patellar glide with moderate crepitus. Patellar and quadriceps tendons unremarkable. Hamstring and quadriceps strength is normal.  After informed written and verbal consent, patient was seated on exam table. Right knee was prepped with alcohol swab and utilizing anterolateral approach, patient's right knee space was injected with 4:1  marcaine 0.5%: Kenalog 57m/dL. Patient tolerated the procedure well without immediate complications.  After informed written and verbal consent, patient was seated on exam table. Left knee was prepped with alcohol swab and utilizing anterolateral approach, patient's left knee space was injected with 4:1  marcaine 0.5%: Kenalog 481mdL. Patient tolerated the procedure well without immediate complications.   Impression and Recommendations:     This case required medical decision making of moderate complexity. The above documentation has been reviewed and is accurate and complete Heather PulleyDO       Note: This dictation was prepared with Dragon dictation along with smaller phrase technology. Any transcriptional errors that result from this process are unintentional.

## 2019-07-12 ENCOUNTER — Encounter: Payer: Self-pay | Admitting: Internal Medicine

## 2019-07-27 ENCOUNTER — Encounter (INDEPENDENT_AMBULATORY_CARE_PROVIDER_SITE_OTHER): Payer: Self-pay | Admitting: Family Medicine

## 2019-07-27 ENCOUNTER — Other Ambulatory Visit: Payer: Self-pay

## 2019-07-27 ENCOUNTER — Ambulatory Visit (INDEPENDENT_AMBULATORY_CARE_PROVIDER_SITE_OTHER): Payer: Medicare Other | Admitting: Family Medicine

## 2019-07-27 VITALS — BP 102/65 | HR 59 | Temp 98.2°F | Ht 65.0 in | Wt 208.0 lb

## 2019-07-27 DIAGNOSIS — Z6834 Body mass index (BMI) 34.0-34.9, adult: Secondary | ICD-10-CM | POA: Diagnosis not present

## 2019-07-27 DIAGNOSIS — I10 Essential (primary) hypertension: Secondary | ICD-10-CM

## 2019-07-27 DIAGNOSIS — E559 Vitamin D deficiency, unspecified: Secondary | ICD-10-CM | POA: Insufficient documentation

## 2019-07-27 DIAGNOSIS — E669 Obesity, unspecified: Secondary | ICD-10-CM | POA: Diagnosis not present

## 2019-07-27 MED ORDER — VITAMIN D (ERGOCALCIFEROL) 1.25 MG (50000 UNIT) PO CAPS: 50000.0000 [IU] | ORAL_CAPSULE | ORAL | 0 refills | Status: DC

## 2019-07-27 NOTE — Progress Notes (Signed)
Chief Complaint:   OBESITY Heather [name] is here to discuss her progress with her obesity treatment plan along with follow-up of her obesity related diagnoses. Heather [name] is on the Category 2 Plan with breakfast options and states she is following her eating plan approximately 30% of the time. Heather [name] states she is on the elliptical for 30-60 minutes 2-3 times per week.  Today's visit was #: 75 Starting weight: 268 lbs Starting date: 12/23/2016 Today's weight: 208 lbs Today's date: 07/27/2019 Total lbs lost to date: 60 Total lbs lost since last in-office visit: 0  Interim History: Heather [name] feels that she completely went off the plan, but feels that even off the plan she was making healthier choices than she would have prior to starting this program. She is ready to refocus.  Subjective:   1. Vitamin D deficiency Heather [name]'s Vit D level on 07/05/2019 was 45.1. She is on prescription strength Vit D supplementation, and is tolerating it well.  2. Essential hypertension Heather [name]'s blood pressure is a little soft at her office visit today. She denies symptoms of orthostatic hypotension. She does not check ambulatory blood pressures at home. She takes furosemide 40 mg q daily, and has been taking it for years.  Assessment/Plan:   1. Vitamin D deficiency Low Vitamin D level contributes to fatigue and are associated with obesity, breast, and colon cancer. We will refill prescription Vitamin D for 1 month. Heather [name] will follow-up for routine testing of Vitamin D, at least 2-3 times per year to avoid over-replacement.  - Vitamin D, Ergocalciferol, (DRISDOL) 1.25 MG (50000 UNIT) CAPS capsule; Take 1 capsule (50,000 Units total) by mouth every 7 (seven) days.  Dispense: 4 capsule; Refill: 0  2. Essential hypertension Heather [name] is working on healthy weight loss and exercise to improve blood pressure control. We will watch for signs of hypotension as she continues her lifestyle modifications. Heather [name] will remain well hydrated.  If dizziness develops she is to follow up with her primary care physician.  3. Class 1 obesity with serious comorbidity and body mass index (BMI) of 34.0 to 34.9 in adult, unspecified obesity type Heather [name] is currently in the action stage of change. As such, her goal is to continue with weight loss efforts. She has agreed to the Category 2 Plan, or keeping a food journal and adhering to recommended goals of 1100-1200 calories and 75+ grams of protein daily or following a lower carbohydrate, vegetable and lean protein rich diet plan.   Heather [name] would like to alternate between full days of Category 2 and Journaling. Then change to the Low carbohydrate plan after she clears out her refrigerator.  Exercise goals: As is.  Behavioral modification strategies: increasing lean protein intake, decreasing simple carbohydrates, no skipping meals and meal planning and cooking strategies.  Heather [name] has agreed to follow-up with our clinic in 3 weeks. She was informed of the importance of frequent follow-up visits to maximize her success with intensive lifestyle modifications for her multiple health conditions.   Objective:   Blood pressure 102/65, pulse (!) 59, temperature 98.2 F (36.8 C), temperature source Oral, height 5' 5"  (1.651 m), weight 208 lb (94.3 kg), SpO2 100 %. Body mass index is 34.61 kg/m.  General: Cooperative, alert, well developed, in no acute distress. HEENT: Conjunctivae and lids unremarkable. Cardiovascular: Regular rhythm.  Lungs: Normal work of breathing. Neurologic: No focal deficits.   Lab Results  Component Value Date   CREATININE 0.82 07/05/2019   BUN 19 07/05/2019   NA 146 (H)  07/05/2019   K 4.1 07/05/2019   CL 110 (H) 07/05/2019   CO2 24 07/05/2019   Lab Results  Component Value Date   ALT 10 07/05/2019   AST 18 07/05/2019   ALKPHOS 67 07/05/2019   BILITOT 0.2 07/05/2019   Lab Results  Component Value Date   HGBA1C 5.8 (H) 07/05/2019   HGBA1C 5.6 12/05/2018    HGBA1C 5.6 04/27/2018   HGBA1C 6.0 (H) 12/01/2017   HGBA1C 5.7 (H) 07/29/2017   Lab Results  Component Value Date   INSULIN 8.6 07/05/2019   INSULIN 6.4 12/05/2018   INSULIN 3.6 04/27/2018   INSULIN 5.6 12/01/2017   INSULIN 6.8 07/29/2017   Lab Results  Component Value Date   TSH 0.93 04/27/2019   Lab Results  Component Value Date   CHOL 186 07/05/2019   HDL 74 07/05/2019   LDLCALC 100 (H) 07/05/2019   TRIG 64 07/05/2019   CHOLHDL 3 03/05/2016   Lab Results  Component Value Date   WBC 2.8 (L) 07/05/2019   HGB 13.2 07/05/2019   HCT 41.1 07/05/2019   MCV 94 07/05/2019   PLT 203 07/05/2019   Lab Results  Component Value Date   IRON 43 (L) 04/26/2018   TIBC 249 (L) 04/26/2018   FERRITIN 60 04/26/2018    Obesity Behavioral Intervention Documentation for Insurance:   Approximately 15 minutes were spent on the discussion below.  ASK: We discussed the diagnosis of obesity with Kaeleen today and Heather [name] agreed to give [name] permission to discuss obesity behavioral modification therapy today.  ASSESS: Heather [name] has the diagnosis of obesity and her BMI today is 34.61. Heather [name] is in the action stage of change.   ADVISE: Heather [name] was educated on the multiple health risks of obesity as well as the benefit of weight loss to improve her health. She was advised of the need for long term treatment and the importance of lifestyle modifications to improve her current health and to decrease her risk of future health problems.  AGREE: Multiple dietary modification options and treatment options were discussed and Heather [name] agreed to follow the recommendations documented in the above note.  ARRANGE: Heather [name] was educated on the importance of frequent visits to treat obesity as outlined per CMS and USPSTF guidelines and agreed to schedule her next follow up appointment today.  Attestation Statements:   Reviewed by clinician on day of visit: allergies, medications, problem list, medical history, surgical  history, family history, social history, and previous encounter notes.   I, [name], am acting as transcriptionist for [name], MD.  I have reviewed the above documentation for accuracy and completeness, and I agree with the above. -  [name], MD

## 2019-08-16 ENCOUNTER — Encounter (INDEPENDENT_AMBULATORY_CARE_PROVIDER_SITE_OTHER): Payer: Self-pay | Admitting: Family Medicine

## 2019-08-16 ENCOUNTER — Other Ambulatory Visit: Payer: Self-pay

## 2019-08-16 ENCOUNTER — Ambulatory Visit (INDEPENDENT_AMBULATORY_CARE_PROVIDER_SITE_OTHER): Payer: Medicare Other | Admitting: Family Medicine

## 2019-08-16 VITALS — BP 100/63 | HR 66 | Temp 99.2°F | Ht 65.0 in | Wt 204.0 lb

## 2019-08-16 DIAGNOSIS — Z6834 Body mass index (BMI) 34.0-34.9, adult: Secondary | ICD-10-CM | POA: Diagnosis not present

## 2019-08-16 DIAGNOSIS — E559 Vitamin D deficiency, unspecified: Secondary | ICD-10-CM | POA: Diagnosis not present

## 2019-08-16 DIAGNOSIS — R7303 Prediabetes: Secondary | ICD-10-CM | POA: Diagnosis not present

## 2019-08-16 DIAGNOSIS — E669 Obesity, unspecified: Secondary | ICD-10-CM | POA: Diagnosis not present

## 2019-08-16 MED ORDER — VITAMIN D (ERGOCALCIFEROL) 1.25 MG (50000 UNIT) PO CAPS: 50000.0000 [IU] | ORAL_CAPSULE | ORAL | 0 refills | Status: DC

## 2019-08-21 NOTE — Progress Notes (Signed)
Chief Complaint:   OBESITY Heather [name] is here to discuss her progress with her obesity treatment plan along with follow-up of her obesity related diagnoses. Heather [name] is on the Category 2 Plan and keeping a food journal and adhering to recommended goals of 1100-1200 calories and 75+ grams of protein daily and states she is following her eating plan approximately 90% of the time. Heather [name] states she is on the elliptical for 30 minutes 2 times per week.  Today's visit was #: 25 Starting weight: 268 lbs Starting date: 12/23/2016 Today's weight: 204 lbs Today's date: 08/16/2019 Total lbs lost to date: 64 Total lbs lost since last in-office visit: 4  Interim History: Heather [name] is following the diet plan 90% of the time, but she notes tempations of snacking on not so healthy foods by her husband. She also at times skips breakfast. She is disappointed she is not losing more and at a quicker rate. She weighs herself 2 times per day.  Subjective:   1. Pre-diabetes Heather [name]'s last A1c was 5.8 on 07/05/2019. She is tolerating the Low carbohydrate diet.  2. Vitamin D deficiency Heather [name]'s Vitamin D level was 45.1 on 07/05/2019. She is currently taking prescription vitamin D 50,000 IU each week. She denies nausea, vomiting or muscle weakness.  Assessment/Plan:   1. Pre-diabetes Heather [name] will continue to work on weight loss, diet plan, exercise, and decreasing simple carbohydrates to help decrease the risk of diabetes. She is to try not to deviate from the plan.  2. Vitamin D deficiency Low Vitamin D level contributes to fatigue and are associated with obesity, breast, and colon cancer. We will refill prescription Vitamin D for 1 month. Heather [name] will follow-up for routine testing of Vitamin D, at least 2-3 times per year to avoid over-replacement.  - Vitamin D, Ergocalciferol, (DRISDOL) 1.25 MG (50000 UNIT) CAPS capsule; Take 1 capsule (50,000 Units total) by mouth every 7 (seven) days.  Dispense: 4 capsule; Refill: 0  3.  Class 1 obesity with serious comorbidity and body mass index (BMI) of 34.0 to 34.9 in adult, unspecified obesity type Heather [name] is currently in the action stage of change. As such, her goal is to continue with weight loss efforts. She has agreed to keeping a food journal and adhering to recommended goals of 1100--1200 calories and 75+ grams of protein daily or following a lower carbohydrate, vegetable and lean protein rich diet plan.   We will recheck IC at her next visit.  Exercise goals: As is. Advised to do geriatric strength training.  Behavioral modification strategies: no skipping meals and keeping healthy foods in the home.  Heather [name] has agreed to follow-up with our clinic in 3 weeks. She was informed of the importance of frequent follow-up visits to maximize her success with intensive lifestyle modifications for her multiple health conditions.   Objective:   Blood pressure 100/63, pulse 66, temperature 99.2 F (37.3 C), temperature source Oral, height 5' 5"  (1.651 m), weight 204 lb (92.5 kg), SpO2 98 %. Body mass index is 33.95 kg/m.  General: Cooperative, alert, well developed, in no acute distress. HEENT: Conjunctivae and lids unremarkable. Cardiovascular: Regular rhythm.  Lungs: Normal work of breathing. Neurologic: No focal deficits.   Lab Results  Component Value Date   CREATININE 0.82 07/05/2019   BUN 19 07/05/2019   NA 146 (H) 07/05/2019   K 4.1 07/05/2019   CL 110 (H) 07/05/2019   CO2 24 07/05/2019   Lab Results  Component Value Date   ALT 10 07/05/2019  AST 18 07/05/2019   ALKPHOS 67 07/05/2019   BILITOT 0.2 07/05/2019   Lab Results  Component Value Date   HGBA1C 5.8 (H) 07/05/2019   HGBA1C 5.6 12/05/2018   HGBA1C 5.6 04/27/2018   HGBA1C 6.0 (H) 12/01/2017   HGBA1C 5.7 (H) 07/29/2017   Lab Results  Component Value Date   INSULIN 8.6 07/05/2019   INSULIN 6.4 12/05/2018   INSULIN 3.6 04/27/2018   INSULIN 5.6 12/01/2017   INSULIN 6.8 07/29/2017   Lab  Results  Component Value Date   TSH 0.93 04/27/2019   Lab Results  Component Value Date   CHOL 186 07/05/2019   HDL 74 07/05/2019   LDLCALC 100 (H) 07/05/2019   TRIG 64 07/05/2019   CHOLHDL 3 03/05/2016   Lab Results  Component Value Date   WBC 2.8 (L) 07/05/2019   HGB 13.2 07/05/2019   HCT 41.1 07/05/2019   MCV 94 07/05/2019   PLT 203 07/05/2019   Lab Results  Component Value Date   IRON 43 (L) 04/26/2018   TIBC 249 (L) 04/26/2018   FERRITIN 60 04/26/2018    Obesity Behavioral Intervention Documentation for Insurance:   Approximately 15 minutes were spent on the discussion below.  ASK: We discussed the diagnosis of obesity with Heather [name] today and Heather [name] agreed to give [name] permission to discuss obesity behavioral modification therapy today.  ASSESS: Heather [name] has the diagnosis of obesity and her BMI today is 33.95. Heather [name] is in the action stage of change.   ADVISE: Heather [name] was educated on the multiple health risks of obesity as well as the benefit of weight loss to improve her health. She was advised of the need for long term treatment and the importance of lifestyle modifications to improve her current health and to decrease her risk of future health problems.  AGREE: Multiple dietary modification options and treatment options were discussed and Heather [name] agreed to follow the recommendations documented in the above note.  ARRANGE: Heather [name] was educated on the importance of frequent visits to treat obesity as outlined per CMS and USPSTF guidelines and agreed to schedule her next follow up appointment today.  Attestation Statements:   Reviewed by clinician on day of visit: allergies, medications, problem list, medical history, surgical history, family history, social history, and previous encounter notes.   I, [name], am acting as transcriptionist for [name], MD.  I have reviewed the above documentation for accuracy and completeness, and I agree with the above. -  [name], MD

## 2019-08-29 ENCOUNTER — Ambulatory Visit (INDEPENDENT_AMBULATORY_CARE_PROVIDER_SITE_OTHER): Payer: Medicare Other | Admitting: Podiatry

## 2019-08-29 ENCOUNTER — Encounter: Payer: Self-pay | Admitting: Family Medicine

## 2019-08-29 ENCOUNTER — Other Ambulatory Visit: Payer: Self-pay

## 2019-08-29 ENCOUNTER — Encounter (INDEPENDENT_AMBULATORY_CARE_PROVIDER_SITE_OTHER): Payer: Self-pay | Admitting: Family Medicine

## 2019-08-29 VITALS — Temp 97.4°F

## 2019-08-29 DIAGNOSIS — B351 Tinea unguium: Secondary | ICD-10-CM

## 2019-08-29 DIAGNOSIS — M79676 Pain in unspecified toe(s): Secondary | ICD-10-CM | POA: Diagnosis not present

## 2019-08-29 DIAGNOSIS — Q828 Other specified congenital malformations of skin: Secondary | ICD-10-CM

## 2019-08-29 MED ORDER — GABAPENTIN 100 MG PO CAPS: 200.0000 mg | ORAL_CAPSULE | Freq: Every day | ORAL | 3 refills | Status: DC

## 2019-08-29 MED ORDER — GABAPENTIN 100 MG PO CAPS
200.0000 mg | ORAL_CAPSULE | Freq: Every day | ORAL | 0 refills | Status: DC
Start: 2019-08-29 — End: 2019-10-11

## 2019-08-29 NOTE — Telephone Encounter (Signed)
FYI/ please advise.   Vit D was sent in I will check with WM

## 2019-08-30 ENCOUNTER — Encounter: Payer: Self-pay | Admitting: Family Medicine

## 2019-08-30 ENCOUNTER — Ambulatory Visit (INDEPENDENT_AMBULATORY_CARE_PROVIDER_SITE_OTHER): Payer: Medicare Other | Admitting: Family Medicine

## 2019-08-30 DIAGNOSIS — D509 Iron deficiency anemia, unspecified: Secondary | ICD-10-CM

## 2019-08-30 DIAGNOSIS — M170 Bilateral primary osteoarthritis of knee: Secondary | ICD-10-CM | POA: Diagnosis not present

## 2019-08-30 DIAGNOSIS — I2510 Atherosclerotic heart disease of native coronary artery without angina pectoris: Secondary | ICD-10-CM | POA: Diagnosis not present

## 2019-08-30 NOTE — Progress Notes (Signed)
Chief complaint of painful elongated toenails.  Objective: Toenails are long thick yellow dystrophic-like mycotic painful palpation as well as debridement.  Assessment: Pain in limb secondary to onychomycosis.  Plan: Debridement of toenails 1 through 5 bilateral.

## 2019-08-30 NOTE — Patient Instructions (Signed)
Take Vit C with iron Decrease Plavix to 3x a week for next week Increase protonix to twice a day for one week Discontinue Pepto IF continue to have black stools needs to see PCP or go to ED especially with stomach pain Send message after holiday See me in 6 weeks

## 2019-08-30 NOTE — Assessment & Plan Note (Signed)
Patient is to be on Plavix but hold 2-3 times a week for this week and then restarted again and check with cardiologist.

## 2019-08-30 NOTE — Assessment & Plan Note (Signed)
I believe the patient does have a GI bleed.  Encouraged her to continue the supplementation and add vitamin C.  Increase omeprazole as well.

## 2019-08-30 NOTE — Assessment & Plan Note (Signed)
Chronic problem with worsening symptoms.  Injecting the steroids again.  Could consider the possibility of repeating the viscosupplementation or potentially PRP.  Discussed the importance of weight loss.  Patient will avoid anti-inflammatories orally secondary to potential GI bleed.  Patient is encouraged to follow-up with primary care provider.  Worsening symptoms to go to the emergency room immediately.  Patient will follow up with me again in 8 to 10 weeks for the knees and can call me sooner if her not having response to the treatment at the moment.

## 2019-08-30 NOTE — Progress Notes (Signed)
[place] Phone: [phone] Subjective:   [name], am serving as a scribe for Dr. Hulan Saas. This visit occurred during the SARS-CoV-2 public health emergency.  Safety protocols were in place, including screening questions prior to the visit, additional usage of staff PPE, and extensive cleaning of exam room while observing appropriate contact time as indicated for disinfecting solutions.   I'm seeing this patient by the request  of:  McLean-Scocuzza, [name], MD  CC: Allover pain.  [id number]   07/11/2019 Bilateral injections given again today.  8 to 10 weeks we will continue.  Chronic problem with exacerbation.  Topical anti-inflammatories given, Tylenol.  Follow-up again in  8 weeks  Update 08/30/2019 Heather [name] is a 74 y.o. female coming in with complaint of bilateral knee pain. Pain in knees are worse than usual patient states. Patient describes having pain in multiple joints for 2 weeks. Had food poisioning for 5 days that caused stomach pain and nausea. Pain increased after being sick. Overall feeling tired. Patient notes loose, black stools since being sick.   Also having right thumb pain due to having to wear compression.      Past Medical History:  Diagnosis Date  . Abdominal hernia   . Abnormal Pap smear   . ALLERGIC RHINITIS 10/22/2006  . Allergy   . ANEMIA 12/18/2008  . Arthritis    scoliosis moderate deg changes lumbar Xray 11/04/17   . ASYMPTOMATIC POSTMENOPAUSAL STATUS 11/22/2007  . Back pain   . CAD (coronary artery disease)    in LAD  . Complication of anesthesia   . Degenerative arthritis   . DIABETES MELLITUS, TYPE II 01/13/2007  . Diverticulosis   . Dyslipidemia   . Fibroid   . Frequent headaches   . GERD 07/20/2007  . GOITER, MULTINODULAR 07/20/2007  . Headache(784.0) 07/20/2007  . HEARING LOSS 11/22/2007  . Hiatal hernia   . Hiatal hernia    large  . History  of chicken pox   . Hx of colposcopy with cervical biopsy   . HYPERCHOLESTEROLEMIA 01/13/2007  . Hyperglycemia   . HYPERTENSION 10/22/2006  . Lung nodule    unchanged since 05/26/13   . Migraines   . NASH (nonalcoholic steatohepatitis)   . Nocturnal hypoxemia 02/17/2013  . Obesity   . Obstructive sleep apnea   . OSA on CPAP    per neurology  . OSTEOARTHRITIS 10/22/2006  . Other chronic nonalcoholic liver disease 2/83/1517  . Sedimentation rate elevation   . Sleep apnea    on cpap  . Urine incontinence   . UTI (urinary tract infection)    Past Surgical History:  Procedure Laterality Date  . BREAST SURGERY  1984   Breast reduction b/l   . CATARACT EXTRACTION, BILATERAL    . DEXA  08/2005  . DILATION AND CURETTAGE OF UTERUS    . ELECTROCARDIOGRAM  10/15/2006  . ESOPHAGOGASTRODUODENOSCOPY  12/08/2005  . FOOT SURGERY     hammertoe and bunion 05/2017   . Stress Cardiolite  10/21/2005  . sweat gland removal    . WISDOM TOOTH EXTRACTION     Social History   Socioeconomic History  . Marital status: Married    Spouse name: [name]  . Number of children: 2  . Years of education: College  . Highest education level: Not on file  Occupational History  . Occupation: Retired  Tobacco Use  . Smoking status: Former Smoker  Packs/day: 2.00    Years: 20.00    Pack years: 40.00    Types: Cigarettes    Quit date: 03/03/1979    Years since quitting: 40.5  . Smokeless tobacco: Never Used  Substance and Sexual Activity  . Alcohol use: Yes    Comment: rare  . Drug use: No  . Sexual activity: Yes    Birth control/protection: Surgical  Other Topics Concern  . Not on file  Social History Narrative   Patient is married [name]).   Patient has two children; 3 pregnancies 2 live births    Patient is retired Designer, jewellery, Chiropodist    Patient has a Financial risk analyst.   Patient is right-handed.   Patient lives at home with family.   Caffeine  Use: 2 soda every other day   Former smoker 20+ years ago 2 ppd quit in 7s   No guns, wears seat belt, safe in relationship    Social Determinants of Health   Financial Resource Strain: Low Risk   . Difficulty of Paying Living Expenses: Not hard at all  Food Insecurity: No Food Insecurity  . Worried About Charity fundraiser in the Last Year: Never true  . Ran Out of Food in the Last Year: Never true  Transportation Needs: No Transportation Needs  . Lack of Transportation (Medical): No  . Lack of Transportation (Non-Medical): No  Physical Activity: Sufficiently Active  . Days of Exercise per Week: 4 days  . Minutes of Exercise per Session: 60 min  Stress: No Stress Concern Present  . Feeling of Stress : Not at all  Social Connections:   . Frequency of Communication with Friends and Family:   . Frequency of Social Gatherings with Friends and Family:   . Attends Religious Services:   . Active Member of Clubs or Organizations:   . Attends Archivist Meetings:   [name] Marital Status:    Allergies  Allergen Reactions  . Aspirin   . Coconut Oil   . Lisinopril     REACTION: cough  . Metoprolol Itching  . Peanut-Containing Drug Products   . Penicillins     REACTION: rash  . Prednisone   . Strawberry Extract    Family History  Problem Relation Age of Onset  . Asthma Mother   . Depression Mother   . Bipolar disorder Mother   . Dementia Mother   . Arthritis Mother   . Breast cancer Mother        primary  . Colon cancer Mother        mets from breast  . Cancer Mother        colon  . Hypertension Father   . Migraines Father   . Cancer Maternal Aunt        ?  [name] Heart disease Maternal Grandmother   . Cancer Maternal Grandfather        stomach ?      Current Outpatient Medications (Cardiovascular):  .  furosemide (LASIX) 40 MG tablet, Take 1 tablet (40 mg total) by mouth daily as needed. .  rosuvastatin (CRESTOR) 40 MG tablet, Take 1 tablet (40 mg total) by  mouth daily. At night   Current Outpatient Medications (Respiratory):  .  cetirizine (ZYRTEC) 10 MG tablet, Take 10 mg by mouth daily. [name]  ipratropium (ATROVENT) 0.06 % nasal spray, Place 2 sprays into both nostrils 4 (four) times daily.   Current Outpatient Medications (Analgesics):  .  acetaminophen (TYLENOL) 500 MG tablet, Take 650 mg by mouth 3 (three) times daily as needed for pain.  .  naproxen sodium (ANAPROX) 220 MG tablet, Take 220 mg by mouth as needed. .  SUMAtriptan (IMITREX) 20 MG/ACT nasal spray, sumatriptan 20 mg/actuation nasal spray   Current Outpatient Medications (Hematological):  .  clopidogrel (PLAVIX) 75 MG tablet, TAKE 1 TABLET DAILY .  Cyanocobalamin 1000 MCG CAPS, Take 1 capsule by mouth daily. .  Ferrous Sulfate (IRON) 325 (65 FE) MG TABS, Take 1 tablet by mouth daily.     Current Outpatient Medications (Other):  [name]  Calcium Carbonate-Vitamin D (CALCIUM 600 + D PO), Take 2 tablets by mouth daily.   [name]  gabapentin (NEURONTIN) 100 MG capsule, Take 2 capsules (200 mg total) by mouth at bedtime. .  gabapentin (NEURONTIN) 100 MG capsule, Take 2 capsules (200 mg total) by mouth at bedtime. [name]  glucosamine-chondroitin 500-400 MG tablet, Take 1 tablet by mouth 2 (two) times daily.   .  Multiple Vitamin (MULTIVITAMIN) tablet, Take 1 tablet by mouth daily.   .  Omega-3 Fatty Acids (FISH OIL) 1000 MG CAPS, Take 1 capsule by mouth daily. .  pantoprazole (PROTONIX) 20 MG tablet, Take 1 tablet (20 mg total) by mouth daily. 30 minutes before food .  polyethylene glycol (MIRALAX / GLYCOLAX) 17 g packet, Take 17 g by mouth daily as needed. .  potassium chloride SA (KLOR-CON M20) 20 MEQ tablet, Take 1 tablet (20 mEq total) by mouth daily. [name]  pyridOXINE (VITAMIN B-6) 100 MG tablet, Take 200 mg by mouth daily. .  SF 5000 PLUS 1.1 % CREA dental cream,  .  topiramate (TOPAMAX) 100 MG tablet, Take 1 tablet (100 mg total) by mouth 2 (two) times daily. .  Turmeric 500 MG TABS, Take 2  tablets by mouth daily. [name]  venlafaxine XR (EFFEXOR XR) 37.5 MG 24 hr capsule, Take 1 capsule (37.5 mg total) by mouth daily with breakfast. .  Vitamin D, Ergocalciferol, (DRISDOL) 1.25 MG (50000 UNIT) CAPS capsule, Take 1 capsule (50,000 Units total) by mouth every 7 (seven) days. .  vitamin E 200 UNIT capsule, Take 200 Units by mouth daily.  Current Facility-Administered Medications (Other):  .  0.9 %  sodium chloride infusion   Reviewed prior external information including notes and imaging from  primary care provider As well as notes that were available from care everywhere and other healthcare systems.  Past medical history, social, surgical and family history all reviewed in electronic medical record.  No pertanent information unless stated regarding to the chief complaint.   Review of Systems:  No headache, visual changes, nausea, vomiting, diarrhea, constipation, dizziness, a skin rash, fevers, chills, night sweats, weight loss, swollen lymph nodes,  joint swelling, chest pain, shortness of breath, mood changes. POSITIVE muscle aches, body aches, abdominal pain  Objective  There were no vitals taken for this visit.   General: No apparent distress alert and oriented x3 mood and affect normal, dressed appropriately.  HEENT: Pupils equal, extraocular movements intact  Respiratory: Patient's speak in full sentences and does not appear short of breath  Cardiovascular: Trace lower extremity edema, non tender, no erythema  Neuro: Cranial nerves II through XII are intact, neurovascularly intact in all extremities with 2+ DTRs and 2+ pulses.  Abdominal exam shows the patient is minimally tender in the epigastric region. Gait severely antalgic Knee: Bilateral valgus deformity noted. Large thigh to calf ratio.  Tender to palpation over medial and PF joint  line.  ROM full in flexion and extension and lower leg rotation. instability with valgus force.  painful patellar  compression. Patellar glide with moderate crepitus. Patellar and quadriceps tendons unremarkable. Hamstring and quadriceps strength is normal.  After informed written and verbal consent, patient was seated on exam table. Right knee was prepped with alcohol swab and utilizing anterolateral approach, patient's right knee space was injected with 4:1  marcaine 0.5%: Kenalog 72m/dL. Patient tolerated the procedure well without immediate complications.  After informed written and verbal consent, patient was seated on exam table. Left knee was prepped with alcohol swab and utilizing anterolateral approach, patient's left knee space was injected with 4:1  marcaine 0.5%: Kenalog 412mdL. Patient tolerated the procedure well without immediate complications.   Impression and Recommendations:     The above documentation has been reviewed and is accurate and complete ZaLyndal PulleyDO       Note: This dictation was prepared with Dragon dictation along with smaller phrase technology. Any transcriptional errors that result from this process are unintentional.

## 2019-08-31 ENCOUNTER — Encounter: Payer: Self-pay | Admitting: Family Medicine

## 2019-09-05 ENCOUNTER — Encounter: Payer: Self-pay | Admitting: Family Medicine

## 2019-09-05 MED ORDER — GABAPENTIN 100 MG PO CAPS
200.0000 mg | ORAL_CAPSULE | Freq: Every day | ORAL | 3 refills | Status: DC
Start: 2019-09-05 — End: 2019-12-07

## 2019-09-06 ENCOUNTER — Ambulatory Visit (INDEPENDENT_AMBULATORY_CARE_PROVIDER_SITE_OTHER): Payer: Medicare Other | Admitting: Adult Health

## 2019-09-06 ENCOUNTER — Encounter (INDEPENDENT_AMBULATORY_CARE_PROVIDER_SITE_OTHER): Payer: Self-pay | Admitting: Adult Health

## 2019-09-06 ENCOUNTER — Other Ambulatory Visit: Payer: Self-pay

## 2019-09-06 VITALS — BP 130/76 | HR 66 | Temp 98.9°F | Ht 65.0 in | Wt 212.0 lb

## 2019-09-06 DIAGNOSIS — E559 Vitamin D deficiency, unspecified: Secondary | ICD-10-CM

## 2019-09-06 DIAGNOSIS — R7303 Prediabetes: Secondary | ICD-10-CM | POA: Insufficient documentation

## 2019-09-06 DIAGNOSIS — Z6835 Body mass index (BMI) 35.0-35.9, adult: Secondary | ICD-10-CM | POA: Diagnosis not present

## 2019-09-06 DIAGNOSIS — I2510 Atherosclerotic heart disease of native coronary artery without angina pectoris: Secondary | ICD-10-CM | POA: Diagnosis not present

## 2019-09-06 MED ORDER — VITAMIN D (ERGOCALCIFEROL) 1.25 MG (50000 UNIT) PO CAPS: 50000.0000 [IU] | ORAL_CAPSULE | ORAL | 0 refills | Status: DC

## 2019-09-06 NOTE — Progress Notes (Signed)
Chief Complaint:   OBESITY Heather [name] is here to discuss her progress with her obesity treatment plan along with follow-up of her obesity related diagnoses. Heather [name] is following a lower carbohydrate, vegetable and lean protein rich diet plan and states she is following her eating plan approximately 0% of the time. Heather [name] states she is exercising 0 minutes 0 times per week.  Today's visit was #: 77 Starting weight: 268 lbs Starting date: 12/23/2016 Today's weight: 212 lbs Today's date: 09/06/2019 Total lbs lost to date: 24 Total lbs lost since last in-office visit: 0  Interim History: Heather [name] states "I've been following nothing the last 3 weeks" due to an acute illness-GI bleed. She denies current abdominal pain and hematochezia. During her GI upset, she has been following a BRAT diet to control nausea. She is disappointed with recent weight gain and eager to get back on track.  Reviewed notes from Dr. Zenovia Jarred Med who treated her for GI bleed.  Pt followed his plan of increasing PPI, adding in Vit C, and temporary reduction in Plavix dosing.  She will resume regular Plavix dosing this week and f/u with Cardiology as directed.   Subjective:   Vitamin D deficiency. [name] is on prescription strength Vitamin D supplementation and is tolerating it well. No nausea, vomiting, or muscle weakness.    Ref. Range 07/05/2019 13:29  Vitamin D, 25-Hydroxy Latest Ref Range: 30.0 - 100.0 ng/mL 45.1   Prediabetes. Heather [name] has a diagnosis of prediabetes based on her elevated HgA1c and was informed this puts her at greater risk of developing diabetes. She continues to work on diet and exercise to decrease her risk of diabetes. She denies nausea or hypoglycemia. [name] was previously on metformin, but not currently. She reports a slight increase in her appetite since following the BRAT diet during her GI bleed.  Lab Results  Component Value Date   HGBA1C 5.8 (H) 07/05/2019   Lab Results  Component  Value Date   INSULIN 8.6 07/05/2019   INSULIN 6.4 12/05/2018   INSULIN 3.6 04/27/2018   INSULIN 5.6 12/01/2017   INSULIN 6.8 07/29/2017   Assessment/Plan:   Vitamin D deficiency. Low Vitamin D level contributes to fatigue and are associated with obesity, breast, and colon cancer. She was given a refill on her Vitamin D, Ergocalciferol, (DRISDOL) 1.25 MG (50000 UNIT) CAPS capsule every week #4 with 0 refills and will follow-up for routine testing of Vitamin D every 3 months.   Prediabetes. Heather [name] will continue to work on weight loss, exercise, and decreasing simple carbohydrates to help decrease the risk of diabetes.  She will resume following the low carbohydrate meal plan and will have labs checked every 3 months.  Class 2 severe obesity with serious comorbidity and body mass index (BMI) of 35.0 to 35.9 in adult, unspecified obesity type ([place]).  Heather [name] is currently in the action stage of change. As such, her goal is to continue with weight loss efforts. She has agreed to following a lower carbohydrate, vegetable and lean protein rich diet plan.   Exercise goals: No exercise has been prescribed at this time.  Behavioral modification strategies: increasing lean protein intake, meal planning and cooking strategies and planning for success.  Heather [name] has agreed to follow-up with our clinic in 3 weeks. She was informed of the importance of frequent follow-up visits to maximize her success with intensive lifestyle modifications for her multiple health conditions.   Objective:   Blood pressure 130/76, pulse 66, temperature 98.9 F (  37.2 C), temperature source Oral, height 5' 5"  (1.651 m), weight 212 lb (96.2 kg), SpO2 99 %. Body mass index is 35.28 kg/m.  General: Cooperative, alert, well developed, in no acute distress. HEENT: Conjunctivae and lids unremarkable. Cardiovascular: Regular rhythm.  Lungs: Normal work of breathing. Neurologic: No focal deficits.   Lab Results  Component Value  Date   CREATININE 0.82 07/05/2019   BUN 19 07/05/2019   NA 146 (H) 07/05/2019   K 4.1 07/05/2019   CL 110 (H) 07/05/2019   CO2 24 07/05/2019   Lab Results  Component Value Date   ALT 10 07/05/2019   AST 18 07/05/2019   ALKPHOS 67 07/05/2019   BILITOT 0.2 07/05/2019   Lab Results  Component Value Date   HGBA1C 5.8 (H) 07/05/2019   HGBA1C 5.6 12/05/2018   HGBA1C 5.6 04/27/2018   HGBA1C 6.0 (H) 12/01/2017   HGBA1C 5.7 (H) 07/29/2017   Lab Results  Component Value Date   INSULIN 8.6 07/05/2019   INSULIN 6.4 12/05/2018   INSULIN 3.6 04/27/2018   INSULIN 5.6 12/01/2017   INSULIN 6.8 07/29/2017   Lab Results  Component Value Date   TSH 0.93 04/27/2019   Lab Results  Component Value Date   CHOL 186 07/05/2019   HDL 74 07/05/2019   LDLCALC 100 (H) 07/05/2019   TRIG 64 07/05/2019   CHOLHDL 3 03/05/2016   Lab Results  Component Value Date   WBC 2.8 (L) 07/05/2019   HGB 13.2 07/05/2019   HCT 41.1 07/05/2019   MCV 94 07/05/2019   PLT 203 07/05/2019   Lab Results  Component Value Date   IRON 43 (L) 04/26/2018   TIBC 249 (L) 04/26/2018   FERRITIN 60 04/26/2018   Obesity Behavioral Intervention Documentation for Insurance:   Approximately 15 minutes were spent on the discussion below.  ASK: We discussed the diagnosis of obesity with Heather [name] today and Heather [name] agreed to give Heather [name] permission to discuss obesity behavioral modification therapy today.  ASSESS: Heather [name] has the diagnosis of obesity and her BMI today is 35.3. Heather [name] is in the action stage of change.   ADVISE: Heather [name] was educated on the multiple health risks of obesity as well as the benefit of weight loss to improve her health. She was advised of the need for long term treatment and the importance of lifestyle modifications to improve her current health and to decrease her risk of future health problems.  AGREE: Multiple dietary modification options and treatment options were discussed and Heather [name] agreed to follow  the recommendations documented in the above note.  ARRANGE: Heather [name] was educated on the importance of frequent visits to treat obesity as outlined per CMS and USPSTF guidelines and agreed to schedule her next follow up appointment today.  Attestation Statements:   Reviewed by clinician on day of visit: allergies, medications, problem list, medical history, surgical history, family history, social history, and previous encounter notes.  I, Michaelene Song, am acting as Location manager for [employer], NP-C   I have reviewed the above documentation for accuracy and completeness, and I agree with the above. -  Esaw Grandchild, NP

## 2019-09-25 ENCOUNTER — Encounter: Payer: Self-pay | Admitting: Internal Medicine

## 2019-09-25 DIAGNOSIS — R609 Edema, unspecified: Secondary | ICD-10-CM

## 2019-09-26 MED ORDER — POTASSIUM CHLORIDE CRYS ER 20 MEQ PO TBCR: 20.0000 meq | EXTENDED_RELEASE_TABLET | Freq: Every day | ORAL | 1 refills | Status: DC

## 2019-10-02 ENCOUNTER — Ambulatory Visit (INDEPENDENT_AMBULATORY_CARE_PROVIDER_SITE_OTHER): Payer: Medicare Other | Admitting: Family Medicine

## 2019-10-03 ENCOUNTER — Encounter (INDEPENDENT_AMBULATORY_CARE_PROVIDER_SITE_OTHER): Payer: Self-pay | Admitting: Family Medicine

## 2019-10-09 ENCOUNTER — Encounter (INDEPENDENT_AMBULATORY_CARE_PROVIDER_SITE_OTHER): Payer: Self-pay | Admitting: Adult Health

## 2019-10-09 ENCOUNTER — Ambulatory Visit (INDEPENDENT_AMBULATORY_CARE_PROVIDER_SITE_OTHER): Payer: Medicare Other | Admitting: Adult Health

## 2019-10-09 ENCOUNTER — Other Ambulatory Visit: Payer: Self-pay

## 2019-10-09 VITALS — BP 135/83 | HR 63 | Temp 98.0°F | Ht 65.0 in | Wt 210.0 lb

## 2019-10-09 DIAGNOSIS — E669 Obesity, unspecified: Secondary | ICD-10-CM | POA: Diagnosis not present

## 2019-10-09 DIAGNOSIS — R0602 Shortness of breath: Secondary | ICD-10-CM | POA: Diagnosis not present

## 2019-10-09 DIAGNOSIS — R7303 Prediabetes: Secondary | ICD-10-CM | POA: Diagnosis not present

## 2019-10-09 DIAGNOSIS — E559 Vitamin D deficiency, unspecified: Secondary | ICD-10-CM

## 2019-10-09 DIAGNOSIS — Z6834 Body mass index (BMI) 34.0-34.9, adult: Secondary | ICD-10-CM

## 2019-10-09 DIAGNOSIS — I2510 Atherosclerotic heart disease of native coronary artery without angina pectoris: Secondary | ICD-10-CM

## 2019-10-09 MED ORDER — VITAMIN D (ERGOCALCIFEROL) 1.25 MG (50000 UNIT) PO CAPS: 50000.0000 [IU] | ORAL_CAPSULE | ORAL | 0 refills | Status: DC

## 2019-10-10 LAB — COMPREHENSIVE METABOLIC PANEL
ALT: 17 IU/L (ref 0–32)
AST: 21 IU/L (ref 0–40)
Albumin/Globulin Ratio: 1.6 (ref 1.2–2.2)
Albumin: 4.3 g/dL (ref 3.7–4.7)
Alkaline Phosphatase: 73 IU/L (ref 48–121)
BUN/Creatinine Ratio: 27 (ref 12–28)
BUN: 19 mg/dL (ref 8–27)
Bilirubin Total: 0.2 mg/dL (ref 0.0–1.2)
CO2: 23 mmol/L (ref 20–29)
Calcium: 9.2 mg/dL (ref 8.7–10.3)
Chloride: 109 mmol/L — ABNORMAL HIGH (ref 96–106)
Creatinine, Ser: 0.7 mg/dL (ref 0.57–1.00)
GFR calc Af Amer: 100 mL/min/{1.73_m2} (ref >59)
GFR calc non Af Amer: 87 mL/min/{1.73_m2} (ref >59)
Globulin, Total: 2.7 g/dL (ref 1.5–4.5)
Glucose: 80 mg/dL (ref 65–99)
Potassium: 4.2 mmol/L (ref 3.5–5.2)
Sodium: 144 mmol/L (ref 134–144)
Total Protein: 7 g/dL (ref 6.0–8.5)

## 2019-10-10 LAB — HEMOGLOBIN A1C
Est. average glucose Bld gHb Est-mCnc: 114 mg/dL
Hgb A1c MFr Bld: 5.6 % (ref 4.8–5.6)

## 2019-10-10 LAB — VITAMIN D 25 HYDROXY (VIT D DEFICIENCY, FRACTURES): Vit D, 25-Hydroxy: 42 ng/mL (ref 30.0–100.0)

## 2019-10-10 LAB — INSULIN, RANDOM: INSULIN: 4.3 u[IU]/mL (ref 2.6–24.9)

## 2019-10-10 NOTE — Progress Notes (Addendum)
Chief Complaint:   Heather [name] is here to discuss her progress with her Heather treatment plan along with follow-up of her Heather related diagnoses. Heather [name] is following a lower carbohydrate, vegetable and lean protein rich diet plan and states she is following her eating plan approximately 85% of the time. Heather [name] states she is doing cardio 30 minutes 3-4 times per week.  Today's visit was #: 64 Starting weight: 268 lbs Starting date: 12/23/2016 Today's weight: 210 lbs Today's date: 10/09/2019 Total lbs lost to date: 73 Total lbs lost since last in-office visit: 2  Interim History: Heather [name] liked the simplicity of the low carbohydrate plan, however, she continues to be frustrated with weight loss plateau. She would like to convert back to a category plan. Her goal is to lose below and remain below 200 lbs. She is traveling to [place] at the end of the month - have a great time!  Subjective:   Vitamin D deficiency. Heather [name] is on prescription strength Vitamin D supplementation and denies nausea, vomiting, or muscle weakness.    Ref. Range 07/05/2019 13:29  Vitamin D, 25-Hydroxy Latest Ref Range: 30.0 - 100.0 ng/mL 45.1   SOB (shortness of breath). Heather [name] notes increasing shortness of breath with exercising and seems to be worsening over time with weight gain. She notes getting out of breath sooner with activity than she used to. Heather [name] denies shortness of breath at rest or orthopnea. Heather [name] reports shortness of breath when climbing stairs, which has been present for greater than 5 years. She denies tobacco/vape products. She denies cardiac symptoms. IC results reviewed with patient today.  Prediabetes. Heather [name] has a diagnosis of prediabetes based on her elevated HgA1c and was informed this puts her at greater risk of developing diabetes. She continues to work on diet and exercise to decrease her risk of diabetes. She denies nausea or hypoglycemia. Heather [name] is not on metformin.  Lab Results    Component Value Date   HGBA1C 5.6 10/09/2019   Lab Results  Component Value Date   INSULIN WILL FOLLOW 10/09/2019   INSULIN 8.6 07/05/2019   INSULIN 6.4 12/05/2018   INSULIN 3.6 04/27/2018   INSULIN 5.6 12/01/2017   Assessment/Plan:   Vitamin D deficiency. Low Vitamin D level contributes to fatigue and are associated with Heather, breast, and colon cancer. She was given a refill on her Vitamin D, Ergocalciferol, (DRISDOL) 1.25 MG (50000 UNIT) CAPS capsule every week #4 with 0 refills and VITAMIN D 25 Hydroxy (Vit-D Deficiency, Fractures) level will be checked today.  SOB (shortness of breath). Heather [name]'s shortness of breath appears to be Heather related and exercise induced. She has agreed to work on weight loss and gradually increase exercise to treat her exercise induced shortness of breath. Will continue to monitor closely. IC checked today showing RMR of 1105.  Prediabetes. Heather [name] will continue to work on weight loss, exercise, and decreasing simple carbohydrates to help decrease the risk of diabetes. Comprehensive metabolic panel, Hemoglobin A1c, Insulin, random labs will be checked today.  Class 1 Heather with serious comorbidity and body mass index (BMI) of 34.0 to 34.9 in adult, unspecified Heather type.  Heather [name] is currently in the action stage of change. As such, her goal is to continue with weight loss efforts. She has agreed to the Category 1 Plan.   Exercise goals: Heather [name] will continue her current exercise regimen.   Behavioral modification strategies: increasing lean protein intake, decreasing simple carbohydrates, meal planning and cooking strategies, travel eating strategies  and planning for success.  Heather [name] has agreed to follow-up with our clinic in 4 weeks. She was informed of the importance of frequent follow-up visits to maximize her success with intensive lifestyle modifications for her multiple health conditions.   Heather [name] was informed we would discuss her lab results at  her next visit unless there is a critical issue that needs to be addressed sooner. Heather [name] agreed to keep her next visit at the agreed upon time to discuss these results.  Objective:   Blood pressure 135/83, pulse 63, temperature 98 F (36.7 C), temperature source Oral, height 5' 5"  (1.651 m), weight 210 lb (95.3 kg), SpO2 98 %. Body mass index is 34.95 kg/m.  General: Cooperative, alert, well developed, in no acute distress. HEENT: Conjunctivae and lids unremarkable. Cardiovascular: Regular rhythm.  Lungs: Normal work of breathing. Neurologic: No focal deficits.   Lab Results  Component Value Date   CREATININE 0.70 10/09/2019   BUN 19 10/09/2019   NA 144 10/09/2019   K 4.2 10/09/2019   CL 109 (H) 10/09/2019   CO2 23 10/09/2019   Lab Results  Component Value Date   ALT 17 10/09/2019   AST 21 10/09/2019   ALKPHOS 73 10/09/2019   BILITOT 0.2 10/09/2019   Lab Results  Component Value Date   HGBA1C 5.6 10/09/2019   HGBA1C 5.8 (H) 07/05/2019   HGBA1C 5.6 12/05/2018   HGBA1C 5.6 04/27/2018   HGBA1C 6.0 (H) 12/01/2017   Lab Results  Component Value Date   INSULIN WILL FOLLOW 10/09/2019   INSULIN 8.6 07/05/2019   INSULIN 6.4 12/05/2018   INSULIN 3.6 04/27/2018   INSULIN 5.6 12/01/2017   Lab Results  Component Value Date   TSH 0.93 04/27/2019   Lab Results  Component Value Date   CHOL 186 07/05/2019   HDL 74 07/05/2019   LDLCALC 100 (H) 07/05/2019   TRIG 64 07/05/2019   CHOLHDL 3 03/05/2016   Lab Results  Component Value Date   WBC 2.8 (L) 07/05/2019   HGB 13.2 07/05/2019   HCT 41.1 07/05/2019   MCV 94 07/05/2019   PLT 203 07/05/2019   Lab Results  Component Value Date   IRON 43 (L) 04/26/2018   TIBC 249 (L) 04/26/2018   FERRITIN 60 04/26/2018   Heather Behavioral Intervention Documentation for Insurance:   Approximately 15 minutes were spent on the discussion below.  ASK: We discussed the diagnosis of Heather with Heather [name] today and Heather [name] agreed to  give [name] permission to discuss Heather behavioral modification therapy today.  ASSESS: Heather [name] has the diagnosis of Heather and her BMI today is 34.9. Heather [name] is in the action stage of change.   ADVISE: Heather [name] was educated on the multiple health risks of Heather as well as the benefit of weight loss to improve her health. She was advised of the need for long term treatment and the importance of lifestyle modifications to improve her current health and to decrease her risk of future health problems.  AGREE: Multiple dietary modification options and treatment options were discussed and Heather [name] agreed to follow the recommendations documented in the above note.  ARRANGE: Heather [name] was educated on the importance of frequent visits to treat Heather as outlined per CMS and USPSTF guidelines and agreed to schedule her next follow up appointment today.  Attestation Statements:   Reviewed by clinician on day of visit: allergies, medications, problem list, medical history, surgical history, family history, social history, and previous encounter notes.  Heather [name], am acting as  transcriptionist for Mina Marble, NP-C   I have reviewed the above documentation for accuracy and completeness, and I agree with the above. -  Esaw Grandchild, NP

## 2019-10-11 ENCOUNTER — Encounter: Payer: Self-pay | Admitting: Family Medicine

## 2019-10-11 ENCOUNTER — Other Ambulatory Visit: Payer: Self-pay

## 2019-10-11 ENCOUNTER — Ambulatory Visit (INDEPENDENT_AMBULATORY_CARE_PROVIDER_SITE_OTHER): Payer: Medicare Other | Admitting: Family Medicine

## 2019-10-11 VITALS — BP 128/80 | HR 61 | Ht 65.0 in | Wt 217.0 lb

## 2019-10-11 DIAGNOSIS — M170 Bilateral primary osteoarthritis of knee: Secondary | ICD-10-CM

## 2019-10-11 DIAGNOSIS — M79644 Pain in right finger(s): Secondary | ICD-10-CM | POA: Diagnosis not present

## 2019-10-11 DIAGNOSIS — I2510 Atherosclerotic heart disease of native coronary artery without angina pectoris: Secondary | ICD-10-CM | POA: Diagnosis not present

## 2019-10-11 DIAGNOSIS — G8929 Other chronic pain: Secondary | ICD-10-CM

## 2019-10-11 NOTE — Assessment & Plan Note (Signed)
Repeat injections given today.  Tolerated the procedure well.  Patient still wants to avoid any surgical intervention.  Continuing to attempt to lose weight and work with healthy weight and wellness.  Chronic problem with exacerbation and will be traveling.  Increase activity slowly.  Follow-up again 4 to 8 weeks.

## 2019-10-11 NOTE — Patient Instructions (Addendum)
Injected both knees today See me again in 8 weeks Brace at night  Have a great trip!

## 2019-10-11 NOTE — Assessment & Plan Note (Signed)
Patient is to discuss his right thumb pain.  Seems to be more secondary to a tendinitis.  Could be potentially a early trigger thumb.  Thumb spica given today.  Will wear at night at least.  Follow-up again in 4 to 8 weeks

## 2019-10-11 NOTE — Progress Notes (Addendum)
[facility] [place] Phone: [phone] Subjective:   Heather [name], am serving as a scribe for Dr. Hulan Saas. This visit occurred during the SARS-CoV-2 public health emergency.  Safety protocols were in place, including screening questions prior to the visit, additional usage of staff PPE, and extensive cleaning of exam room while observing appropriate contact time as indicated for disinfecting solutions.   I'm seeing this patient by the request  of:  McLean-Scocuzza, [name], MD  CC: Bilateral knee pain  [id number]   08/30/2019 Chronic problem with worsening symptoms.  Injecting the steroids again.  Could consider the possibility of repeating the viscosupplementation or potentially PRP.  Discussed the importance of weight loss.  Patient will avoid anti-inflammatories orally secondary to potential GI bleed.  Patient is encouraged to follow-up with primary care provider.  Worsening symptoms to go to the emergency room immediately.  Patient will follow up with me again in 8 to 10 weeks for the knees and can call me sooner if her not having response to the treatment at the moment.  Patient is to be on Plavix but hold 2-3 times a week for this week and then restarted again and check with cardiologist.  I believe the patient does have a GI bleed.  Encouraged her to continue the supplementation and add vitamin C.  Increase omeprazole as well.  Update 10/11/2019 Heather [name] is a 74 y.o. female coming in with complaint of bilateral knee pain. Stairs are the biggest problem for patient. Injections do help. Does not feel strong enough to lift body up stairs.  Patient states that her still last. States that the knees are the worst pain  Right thumb pain from PIP to Jal joints. Unable to hold pencil or open jar.       Past Medical History:  Diagnosis Date  . Abdominal hernia   . Abnormal Pap smear   . ALLERGIC RHINITIS  10/22/2006  . Allergy   . ANEMIA 12/18/2008  . Arthritis    scoliosis moderate deg changes lumbar Xray 11/04/17   . ASYMPTOMATIC POSTMENOPAUSAL STATUS 11/22/2007  . Back pain   . CAD (coronary artery disease)    in LAD  . Complication of anesthesia   . Degenerative arthritis   . DIABETES MELLITUS, TYPE II 01/13/2007  . Diverticulosis   . Dyslipidemia   . Fibroid   . Frequent headaches   . GERD 07/20/2007  . GOITER, MULTINODULAR 07/20/2007  . Headache(784.0) 07/20/2007  . HEARING LOSS 11/22/2007  . Hiatal hernia   . Hiatal hernia    large  . History of chicken pox   . Hx of colposcopy with cervical biopsy   . HYPERCHOLESTEROLEMIA 01/13/2007  . Hyperglycemia   . HYPERTENSION 10/22/2006  . Lung nodule    unchanged since 05/26/13   . Migraines   . NASH (nonalcoholic steatohepatitis)   . Nocturnal hypoxemia 02/17/2013  . Obesity   . Obstructive sleep apnea   . OSA on CPAP    per neurology  . OSTEOARTHRITIS 10/22/2006  . Other chronic nonalcoholic liver disease 9/56/3875  . Sedimentation rate elevation   . Sleep apnea    on cpap  . Urine incontinence   . UTI (urinary tract infection)    Past Surgical History:  Procedure Laterality Date  . BREAST SURGERY  1984   Breast reduction b/l   . CATARACT EXTRACTION, BILATERAL    . DEXA  08/2005  . DILATION AND  CURETTAGE OF UTERUS    . ELECTROCARDIOGRAM  10/15/2006  . ESOPHAGOGASTRODUODENOSCOPY  12/08/2005  . FOOT SURGERY     hammertoe and bunion 05/2017   . Stress Cardiolite  10/21/2005  . sweat gland removal    . WISDOM TOOTH EXTRACTION     Social History   Socioeconomic History  . Marital status: Married    Spouse name: [name]  . Number of children: 2  . Years of education: College  . Highest education level: Not on file  Occupational History  . Occupation: Retired  Tobacco Use  . Smoking status: Former Smoker    Packs/day: 2.00    Years: 20.00    Pack years: 40.00    Types: Cigarettes    Quit date: 03/03/1979    Years  since quitting: 40.6  . Smokeless tobacco: Never Used  Substance and Sexual Activity  . Alcohol use: Yes    Comment: rare  . Drug use: [name]  . Sexual activity: Yes    Birth control/protection: Surgical  Other Topics Concern  . Not on file  Social History Narrative   Patient is married [name]).   Patient has two children; 3 pregnancies 2 live births    Patient is retired Designer, jewellery, Chiropodist    Patient has a Financial risk analyst.   Patient is right-handed.   Patient lives at home with family.   Caffeine Use: 2 soda every other day   Former smoker 20+ years ago 2 ppd quit in 81s   [name] guns, wears seat belt, safe in relationship    Social Determinants of Health   Financial Resource Strain: Low Risk   . Difficulty of Paying Living Expenses: Not hard at all  Food Insecurity: [name] Food Insecurity  . Worried About Charity fundraiser in the Last Year: Never true  . Ran Out of Food in the Last Year: Never true  Transportation Needs: [name] Transportation Needs  . Lack of Transportation (Medical): [name]  . Lack of Transportation (Non-Medical): [name]  Physical Activity: Sufficiently Active  . Days of Exercise per Week: 4 days  . Minutes of Exercise per Session: 60 min  Stress: [name] Stress Concern Present  . Feeling of Stress : Not at all  Social Connections:   . Frequency of Communication with Friends and Family:   . Frequency of Social Gatherings with Friends and Family:   . Attends Religious Services:   . Active Member of Clubs or Organizations:   . Attends Archivist Meetings:   [name] Marital Status:    Allergies  Allergen Reactions  . Aspirin   . Coconut Oil   . Lisinopril     REACTION: cough  . Metoprolol Itching  . Peanut-Containing Drug Products   . Penicillins     REACTION: rash  . Prednisone   . Strawberry Extract    Family History  Problem Relation Age of Onset  . Asthma Mother   . Depression Mother   . Bipolar disorder  Mother   . Dementia Mother   . Arthritis Mother   . Breast cancer Mother        primary  . Colon cancer Mother        mets from breast  . Cancer Mother        colon  . Hypertension Father   . Migraines Father   . Cancer Maternal Aunt        ?  [name] Heart disease Maternal Grandmother   .  Cancer Maternal Grandfather        stomach ?      Current Outpatient Medications (Cardiovascular):  .  furosemide (LASIX) 40 MG tablet, Take 1 tablet (40 mg total) by mouth daily as needed. .  rosuvastatin (CRESTOR) 40 MG tablet, Take 1 tablet (40 mg total) by mouth daily. At night   Current Outpatient Medications (Respiratory):  .  cetirizine (ZYRTEC) 10 MG tablet, Take 10 mg by mouth daily. [name]  ipratropium (ATROVENT) 0.06 % nasal spray, Place 2 sprays into both nostrils 4 (four) times daily.   Current Outpatient Medications (Analgesics):  .  acetaminophen (TYLENOL) 500 MG tablet, Take 650 mg by mouth 3 (three) times daily as needed for pain.  .  naproxen sodium (ANAPROX) 220 MG tablet, Take 220 mg by mouth as needed. .  SUMAtriptan (IMITREX) 20 MG/ACT nasal spray, sumatriptan 20 mg/actuation nasal spray   Current Outpatient Medications (Hematological):  .  clopidogrel (PLAVIX) 75 MG tablet, TAKE 1 TABLET DAILY .  Cyanocobalamin 1000 MCG CAPS, Take 1 capsule by mouth daily. .  Ferrous Sulfate (IRON) 325 (65 FE) MG TABS, Take 1 tablet by mouth daily.     Current Outpatient Medications (Other):  [name]  Calcium Carbonate-Vitamin D (CALCIUM 600 + D PO), Take 2 tablets by mouth daily.   [name]  gabapentin (NEURONTIN) 100 MG capsule, Take 2 capsules (200 mg total) by mouth at bedtime. [name]  glucosamine-chondroitin 500-400 MG tablet, Take 1 tablet by mouth 2 (two) times daily.   .  Multiple Vitamin (MULTIVITAMIN) tablet, Take 1 tablet by mouth daily.   .  Omega-3 Fatty Acids (FISH OIL) 1000 MG CAPS, Take 1 capsule by mouth daily. .  pantoprazole (PROTONIX) 20 MG tablet, Take 1 tablet (20 mg total) by mouth  daily. 30 minutes before food .  polyethylene glycol (MIRALAX / GLYCOLAX) 17 g packet, Take 17 g by mouth daily as needed. .  potassium chloride SA (KLOR-CON M20) 20 MEQ tablet, Take 1 tablet (20 mEq total) by mouth daily. [name]  pyridOXINE (VITAMIN B-6) 100 MG tablet, Take 200 mg by mouth daily. .  SF 5000 PLUS 1.1 % CREA dental cream,  .  topiramate (TOPAMAX) 100 MG tablet, Take 1 tablet (100 mg total) by mouth 2 (two) times daily. .  Turmeric 500 MG TABS, Take 2 tablets by mouth daily. [name]  venlafaxine XR (EFFEXOR XR) 37.5 MG 24 hr capsule, Take 1 capsule (37.5 mg total) by mouth daily with breakfast. .  Vitamin D, Ergocalciferol, (DRISDOL) 1.25 MG (50000 UNIT) CAPS capsule, Take 1 capsule (50,000 Units total) by mouth every 7 (seven) days. .  vitamin E 200 UNIT capsule, Take 200 Units by mouth daily.  Current Facility-Administered Medications (Other):  .  0.9 %  sodium chloride infusion   Reviewed prior external information including notes and imaging from  primary care provider As well as notes that were available from care everywhere and other healthcare systems.  Past medical history, social, surgical and family history all reviewed in electronic medical record.  [name] pertanent information unless stated regarding to the chief complaint.   Review of Systems:  [name] headache, visual changes, nausea, vomiting, diarrhea, constipation, dizziness, abdominal pain, skin rash, fevers, chills, night sweats, weight loss, swollen lymph nodes, , chest pain, shortness of breath, mood changes. POSITIVE muscle aches, body aches, joint swelling  Objective  Blood pressure 128/80, pulse 61, height 5' 5"  (1.651 m), weight 217 lb (98.4 kg), SpO2 99 %.   General: [name] apparent  distress alert and oriented x3 mood and affect normal, dressed appropriately.  HEENT: Pupils equal, extraocular movements intact  Respiratory: Patient's speak in full sentences and does not appear short of breath  Cardiovascular: 1+ lower  extremity edema, non tender, [name] erythema  Neuro: Cranial nerves II through XII are intact, neurovascularly intact in all extremities with 2+ DTRs and 2+ pulses.  Gait severely antalgic MSK: Knee: Bilateral valgus deformity noted. Large thigh to calf ratio.  Tender to palpation over medial and PF joint line.  ROM full in flexion and extension and lower leg rotation. instability with valgus force.  painful patellar compression. Patellar glide with moderate crepitus. Patellar and quadriceps tendons unremarkable. Hamstring and quadriceps strength is normal.  Right hand exam shows the patient does have some mild positive grind test of the [facility] but more of a triggering noted but [name] true locking.  After informed written and verbal consent, patient was seated on exam table. Right knee was prepped with alcohol swab and utilizing anterolateral approach, patient's right knee space was injected with 4:1  marcaine 0.5%: Kenalog 45m/dL. Patient tolerated the procedure well without immediate complications.  After informed written and verbal consent, patient was seated on exam table. Left knee was prepped with alcohol swab and utilizing anterolateral approach, patient's left knee space was injected with 4:1  marcaine 0.5%: Kenalog 477mdL. Patient tolerated the procedure well without immediate complications.     Impression and Recommendations:     The above documentation has been reviewed and is accurate and complete ZaLyndal PulleyDO       Note: This dictation was prepared with Dragon dictation along with smaller phrase technology. Any transcriptional errors that result from this process are unintentional.

## 2019-11-07 ENCOUNTER — Other Ambulatory Visit: Payer: Self-pay

## 2019-11-07 ENCOUNTER — Encounter (INDEPENDENT_AMBULATORY_CARE_PROVIDER_SITE_OTHER): Payer: Self-pay | Admitting: Family Medicine

## 2019-11-07 ENCOUNTER — Ambulatory Visit (INDEPENDENT_AMBULATORY_CARE_PROVIDER_SITE_OTHER): Payer: Medicare Other | Admitting: Family Medicine

## 2019-11-07 VITALS — BP 115/70 | HR 77 | Temp 97.9°F | Ht 65.0 in | Wt 214.0 lb

## 2019-11-07 DIAGNOSIS — Z6835 Body mass index (BMI) 35.0-35.9, adult: Secondary | ICD-10-CM

## 2019-11-07 DIAGNOSIS — E559 Vitamin D deficiency, unspecified: Secondary | ICD-10-CM

## 2019-11-07 MED ORDER — VITAMIN D (ERGOCALCIFEROL) 1.25 MG (50000 UNIT) PO CAPS: 50000.0000 [IU] | ORAL_CAPSULE | ORAL | 0 refills | Status: DC

## 2019-11-08 ENCOUNTER — Other Ambulatory Visit: Payer: Self-pay

## 2019-11-08 ENCOUNTER — Ambulatory Visit (INDEPENDENT_AMBULATORY_CARE_PROVIDER_SITE_OTHER): Payer: Medicare Other | Admitting: Internal Medicine

## 2019-11-08 ENCOUNTER — Encounter: Payer: Self-pay | Admitting: Internal Medicine

## 2019-11-08 VITALS — BP 114/74 | HR 73 | Temp 98.5°F | Ht 65.0 in | Wt 216.6 lb

## 2019-11-08 DIAGNOSIS — G8929 Other chronic pain: Secondary | ICD-10-CM | POA: Diagnosis not present

## 2019-11-08 DIAGNOSIS — E669 Obesity, unspecified: Secondary | ICD-10-CM | POA: Diagnosis not present

## 2019-11-08 DIAGNOSIS — M79644 Pain in right finger(s): Secondary | ICD-10-CM

## 2019-11-08 DIAGNOSIS — Z23 Encounter for immunization: Secondary | ICD-10-CM

## 2019-11-08 DIAGNOSIS — D72819 Decreased white blood cell count, unspecified: Secondary | ICD-10-CM | POA: Diagnosis not present

## 2019-11-08 DIAGNOSIS — R413 Other amnesia: Secondary | ICD-10-CM | POA: Diagnosis not present

## 2019-11-08 MED ORDER — TRAMADOL HCL 50 MG PO TABS: 50.0000 mg | ORAL_TABLET | Freq: Two times a day (BID) | ORAL | 0 refills | Status: AC | PRN

## 2019-11-08 NOTE — Progress Notes (Addendum)
Chief Complaint  Patient presents with  . Follow-up  . Immunizations    flu shot high dose    F/u -they just returned from [place] with family. In office with husband today 1. High dose flu shot given today 2. Leukopenia if still persists agreeable to see h/o Dr. Irene Limbo  3. C/o right thumb pain worse at night intermittently somewhat improved using brace and seeing Dr. Tamala [facility]) she is able to write now and somewhat improved. She wants Rx for pain medication as otc meds do not help all her pains I.e arthritis and thumb pain just to use sparingly  4. Memory loss chronic and wants to see neurology for this. She has short term memory and has to be qued at times by her husband  Review of Systems  Constitutional: Negative for weight loss.  HENT: Negative for hearing loss.   Eyes: Negative for blurred vision.  Respiratory: Negative for shortness of breath.   Cardiovascular: Negative for chest pain and leg swelling.  Gastrointestinal: Negative for abdominal pain.  Musculoskeletal: Positive for falls.       Right thumb pain resolved for now but intermittently flares   Skin: Negative for rash.  Neurological: Negative for headaches.  Psychiatric/Behavioral: Positive for memory loss.   Past Medical History:  Diagnosis Date  . Abdominal hernia   . Abnormal Pap smear   . ALLERGIC RHINITIS 10/22/2006  . Allergy   . ANEMIA 12/18/2008  . Arthritis    scoliosis moderate deg changes lumbar Xray 11/04/17   . ASYMPTOMATIC POSTMENOPAUSAL STATUS 11/22/2007  . Back pain   . CAD (coronary artery disease)    in LAD  . Complication of anesthesia   . Degenerative arthritis   . DIABETES MELLITUS, TYPE II 01/13/2007  . Diverticulosis   . Dyslipidemia   . Fibroid   . Frequent headaches   . GERD 07/20/2007  . GOITER, MULTINODULAR 07/20/2007  . Headache(784.0) 07/20/2007  . HEARING LOSS 11/22/2007  . Hiatal hernia   . Hiatal hernia    large  . History of chicken pox   . Hx of colposcopy with cervical  biopsy   . HYPERCHOLESTEROLEMIA 01/13/2007  . Hyperglycemia   . HYPERTENSION 10/22/2006  . Lung nodule    unchanged since 05/26/13   . Migraines   . NASH (nonalcoholic steatohepatitis)   . Nocturnal hypoxemia 02/17/2013  . Obesity   . Obstructive sleep apnea   . OSA on CPAP    per neurology  . OSTEOARTHRITIS 10/22/2006  . Other chronic nonalcoholic liver disease 3/38/2505  . Sedimentation rate elevation   . Sleep apnea    on cpap  . Urine incontinence   . UTI (urinary tract infection)    Past Surgical History:  Procedure Laterality Date  . BREAST SURGERY  1984   Breast reduction b/l   . CATARACT EXTRACTION, BILATERAL    . DEXA  08/2005  . DILATION AND CURETTAGE OF UTERUS    . ELECTROCARDIOGRAM  10/15/2006  . ESOPHAGOGASTRODUODENOSCOPY  12/08/2005  . FOOT SURGERY     hammertoe and bunion 05/2017   . Stress Cardiolite  10/21/2005  . sweat gland removal    . WISDOM TOOTH EXTRACTION     Family History  Problem Relation Age of Onset  . Asthma Mother   . Depression Mother   . Bipolar disorder Mother   . Dementia Mother   . Arthritis Mother   . Breast cancer Mother        primary  . Colon  cancer Mother        mets from breast  . Cancer Mother        colon  . Hypertension Father   . Migraines Father   . Cancer Maternal Aunt        ?  [name] Heart disease Maternal Grandmother   . Cancer Maternal Grandfather        stomach ?   Social History   Socioeconomic History  . Marital status: Married    Spouse name: [name]  . Number of children: 2  . Years of education: College  . Highest education level: Not on file  Occupational History  . Occupation: Retired  Tobacco Use  . Smoking status: Former Smoker    Packs/day: 2.00    Years: 20.00    Pack years: 40.00    Types: Cigarettes    Quit date: 03/03/1979    Years since quitting: 40.7  . Smokeless tobacco: Never Used  Substance and Sexual Activity  . Alcohol use: Yes    Comment: rare  . Drug use: No  . Sexual  activity: Yes    Birth control/protection: Surgical  Other Topics Concern  . Not on file  Social History Narrative   Patient is married [name]).   Patient has two children; 3 pregnancies 2 live births    Patient is retired Designer, jewellery, Chiropodist    Patient has a Financial risk analyst.   Patient is right-handed.   Patient lives at home with family.   Caffeine Use: 2 soda every other day   Former smoker 20+ years ago 2 ppd quit in 47s   No guns, wears seat belt, safe in relationship    Social Determinants of Health   Financial Resource Strain: Low Risk   . Difficulty of Paying Living Expenses: Not hard at all  Food Insecurity: No Food Insecurity  . Worried About Charity fundraiser in the Last Year: Never true  . Ran Out of Food in the Last Year: Never true  Transportation Needs: No Transportation Needs  . Lack of Transportation (Medical): No  . Lack of Transportation (Non-Medical): No  Physical Activity: Sufficiently Active  . Days of Exercise per Week: 4 days  . Minutes of Exercise per Session: 60 min  Stress: No Stress Concern Present  . Feeling of Stress : Not at all  Social Connections:   . Frequency of Communication with Friends and Family: Not on file  . Frequency of Social Gatherings with Friends and Family: Not on file  . Attends Religious Services: Not on file  . Active Member of Clubs or Organizations: Not on file  . Attends Archivist Meetings: Not on file  . Marital Status: Not on file  Intimate Partner Violence: Not At Risk  . Fear of Current or Ex-Partner: No  . Emotionally Abused: No  . Physically Abused: No  . Sexually Abused: No   Current Meds  Medication Sig  . acetaminophen (TYLENOL) 500 MG tablet Take 650 mg by mouth 3 (three) times daily as needed for pain.   . Calcium Carbonate-Vitamin D (CALCIUM 600 + D PO) Take 2 tablets by mouth daily.    . cetirizine (ZYRTEC) 10 MG tablet Take 10 mg by mouth  daily.  . clopidogrel (PLAVIX) 75 MG tablet TAKE 1 TABLET DAILY  . Cyanocobalamin 1000 MCG CAPS Take 1 capsule by mouth daily.  . Ferrous Sulfate (IRON) 325 (65 FE) MG TABS Take 1 tablet by mouth  daily.    . furosemide (LASIX) 40 MG tablet Take 1 tablet (40 mg total) by mouth daily as needed.  . gabapentin (NEURONTIN) 100 MG capsule Take 2 capsules (200 mg total) by mouth at bedtime.  [name] glucosamine-chondroitin 500-400 MG tablet Take 1 tablet by mouth 2 (two) times daily.    [name] ipratropium (ATROVENT) 0.06 % nasal spray Place 2 sprays into both nostrils 4 (four) times daily.  . Multiple Vitamin (MULTIVITAMIN) tablet Take 1 tablet by mouth daily.    . naproxen sodium (ANAPROX) 220 MG tablet Take 220 mg by mouth as needed.  . Omega-3 Fatty Acids (FISH OIL) 1000 MG CAPS Take 1 capsule by mouth daily.  . pantoprazole (PROTONIX) 20 MG tablet Take 1 tablet (20 mg total) by mouth daily. 30 minutes before food  . polyethylene glycol (MIRALAX / GLYCOLAX) 17 g packet Take 17 g by mouth daily as needed.  . potassium chloride SA (KLOR-CON M20) 20 MEQ tablet Take 1 tablet (20 mEq total) by mouth daily.  [name] pyridOXINE (VITAMIN B-6) 100 MG tablet Take 200 mg by mouth daily.  . rosuvastatin (CRESTOR) 40 MG tablet Take 1 tablet (40 mg total) by mouth daily. At night  . SF 5000 PLUS 1.1 % CREA dental cream   . SUMAtriptan (IMITREX) 20 MG/ACT nasal spray sumatriptan 20 mg/actuation nasal spray  . topiramate (TOPAMAX) 100 MG tablet Take 1 tablet (100 mg total) by mouth 2 (two) times daily.  . Turmeric 500 MG TABS Take 2 tablets by mouth daily.  . Vitamin D, Ergocalciferol, (DRISDOL) 1.25 MG (50000 UNIT) CAPS capsule Take 1 capsule (50,000 Units total) by mouth every 7 (seven) days.  . vitamin E 200 UNIT capsule Take 200 Units by mouth daily.   Allergies  Allergen Reactions  . Aspirin   . Coconut Oil   . Lisinopril     REACTION: cough  . Metoprolol Itching  . Peanut-Containing Drug Products   . Penicillins      REACTION: rash  . Prednisone   . Strawberry Extract    Recent Results (from the past 2160 hour(s))  Comprehensive metabolic panel     Status: Abnormal   Collection Time: 10/09/19  2:25 PM  Result Value Ref Range   Glucose 80 65 - 99 mg/dL   BUN 19 8 - 27 mg/dL   Creatinine, Ser 0.70 0.57 - 1.00 mg/dL   GFR calc non Af Amer 87 >59 mL/min/1.73   GFR calc Af Amer 100 >59 mL/min/1.73    Comment: **[facility] currently reports eGFR in compliance with the current**   recommendations of the [employer]. [facility] will   update reporting as new guidelines are published from the NKF-ASN   Task force.    BUN/Creatinine Ratio 27 12 - 28   Sodium 144 134 - 144 mmol/L   Potassium 4.2 3.5 - 5.2 mmol/L   Chloride 109 (H) 96 - 106 mmol/L   CO2 23 20 - 29 mmol/L   Calcium 9.2 8.7 - 10.3 mg/dL   Total Protein 7.0 6.0 - 8.5 g/dL   Albumin 4.3 3.7 - 4.7 g/dL   Globulin, Total 2.7 1.5 - 4.5 g/dL   Albumin/Globulin Ratio 1.6 1.2 - 2.2   Bilirubin Total 0.2 0.0 - 1.2 mg/dL   Alkaline Phosphatase 73 48 - 121 IU/L   AST 21 0 - 40 IU/L   ALT 17 0 - 32 IU/L  Hemoglobin A1c     Status: None   Collection Time: 10/09/19  2:25 PM  Result Value Ref Range   Hgb A1c MFr Bld 5.6 4.8 - 5.6 %    Comment:          Prediabetes: 5.7 - 6.4          Diabetes: >6.4          Glycemic control for adults with diabetes: <7.0    Est. average glucose Bld gHb Est-mCnc 114 mg/dL  Insulin, random     Status: None   Collection Time: 10/09/19  2:25 PM  Result Value Ref Range   INSULIN 4.3 2.6 - 24.9 uIU/mL  VITAMIN D 25 Hydroxy (Vit-D Deficiency, Fractures)     Status: None   Collection Time: 10/09/19  2:25 PM  Result Value Ref Range   Vit D, 25-Hydroxy 42.0 30.0 - 100.0 ng/mL    Comment: Vitamin D deficiency has been defined by the [place] and an Endocrine Society practice guideline as a level of serum 25-OH vitamin D less than 20 ng/mL (1,2). The Endocrine Society went on to further define  vitamin D insufficiency as a level between 21 and 29 ng/mL (2). 1. IOM (Institute of Medicine). 2010. Dietary reference    intakes for calcium and D. [place]: The    [employer]. 2. Holick MF, Binkley [place], Bischoff-Ferrari HA, et al.    Evaluation, treatment, and prevention of vitamin D    deficiency: an Endocrine Society clinical practice    guideline. JCEM. 2011 Jul; 96(7):1911-30.   CBC with Differential/Platelet     Status: Abnormal   Collection Time: 11/08/19  2:39 PM  Result Value Ref Range   WBC 3.9 (L) 4.0 - 10.5 K/uL   RBC 4.07 3.87 - 5.11 Mil/uL   Hemoglobin 12.8 12.0 - 15.0 g/dL   HCT 39.0 36 - 46 %   MCV 95.8 78.0 - 100.0 fl   MCHC 32.9 30.0 - 36.0 g/dL   RDW 15.4 11.5 - 15.5 %   Platelets 204.0 150 - 400 K/uL   Neutrophils Relative % 61.4 43 - 77 %   Lymphocytes Relative 24.7 12 - 46 %   Monocytes Relative 10.6 3 - 12 %   Eosinophils Relative 2.4 0 - 5 %   Basophils Relative 0.9 0 - 3 %   Neutro Abs 2.4 1.4 - 7.7 K/uL   Lymphs Abs 1.0 0.7 - 4.0 K/uL   Monocytes Absolute 0.4 0 - 1 K/uL   Eosinophils Absolute 0.1 0 - 0 K/uL   Basophils Absolute 0.0 0 - 0 K/uL   Objective  Body mass index is 36.04 kg/m. Wt Readings from Last 3 Encounters:  11/08/19 216 lb 9.6 oz (98.2 kg)  11/07/19 214 lb (97.1 kg)  10/11/19 217 lb (98.4 kg)   Temp Readings from Last 3 Encounters:  11/08/19 98.5 F (36.9 C) (Oral)  11/07/19 97.9 F (36.6 C)  10/09/19 98 F (36.7 C) (Oral)   BP Readings from Last 3 Encounters:  11/08/19 114/74  11/07/19 115/70  10/11/19 128/80   Pulse Readings from Last 3 Encounters:  11/08/19 73  11/07/19 77  10/11/19 61    Physical Exam Vitals and nursing note reviewed.  Constitutional:      Appearance: Normal appearance. She is well-developed and well-groomed. She is obese.  HENT:     Head: Normocephalic and atraumatic.  Eyes:     Conjunctiva/sclera: Conjunctivae normal.     Pupils: Pupils are equal, round, and reactive  to light.  Cardiovascular:  Rate and Rhythm: Normal rate and regular rhythm.     Heart sounds: Normal heart sounds. No murmur heard.   Pulmonary:     Effort: Pulmonary effort is normal.     Breath sounds: Normal breath sounds.  Skin:    General: Skin is warm and dry.  Neurological:     General: No focal deficit present.     Mental Status: She is alert and oriented to person, place, and time. Mental status is at baseline.     Gait: Gait normal.  Psychiatric:        Attention and Perception: Attention and perception normal.        Mood and Affect: Mood and affect normal.        Speech: Speech normal.        Behavior: Behavior normal. Behavior is cooperative.        Thought Content: Thought content normal.        Cognition and Memory: Cognition normal. Memory is impaired. She exhibits impaired recent memory.        Judgment: Judgment normal.     Assessment  Plan  Memory loss - Plan: Ambulatory referral to Neurology [facility]  Leukopenia, unspecified type - Plan: CBC with Differential/Platelet, Ambulatory referral to Hematology Dr. Irene Limbo  Chronic pain of right thumb and other  Arthritis pain - Plan: traMADol (ULTRAM) 50 MG tablet use sparingly  If will be chronic will need pain contract signed  Obesity (BMI 30-39.9)  Healthy diet and exercise  F/u wt loss clinic as sch   HM Flu shot high doseutdgiven today  prevnar had 01/30/14 pna 23given today 01/18/2019 zostavax had 01/30/14 -consider shingrix given Rx for this today 01/18/19 Tdap 01/30/14  covid 19 2/2 3rd dose due 12/25/19   DEXA 03/06/15 normal   mammo copy get from Dr. Leo Grosser [facility] -per pt she will f/u with Dr. Mancel Bale who ordered her mammogram -mammogram 06/20/18 normal  Pap out of age window  Colonoscopy 06/18/16 severe diverticulosis polyp x 1 tubular adenoma FH mom colon cancer f/u in 5 years  HCV neg 12/28/11  Disc Eye exam as at f/u and consider if not had  Former smoker quit in 21s  smoker x 20 years+ 2 ppd. CT 01/14/18 mild COPD (off Advair), stable goiter, mild CAD, stable left lung nodule lower lobe likely benign  CT chest 12/2017 benign lung nodules noted  Other providers [facility] Dr. Leafy Ro, congratulated  Sports medicine Dr. Tamala Julian Cardiologist Dr. [name]  Provider: Dr. Olivia Mackie McLean-Scocuzza-Internal Medicine

## 2019-11-08 NOTE — Progress Notes (Signed)
Chief Complaint:   OBESITY Kalyse is here to discuss her progress with her obesity treatment plan along with follow-up of her obesity related diagnoses. Usha is on the Category 1 Plan and states she is following her eating plan approximately 0% of the time. Neko states she is walking 4,000 steps 7 times per week.  Today's visit was #: 41 Starting weight: 268 lbs Starting date: 12/23/2016 Today's weight: 214 lbs Today's date: 11/07/2019 Total lbs lost to date: 54 Total lbs lost since last in-office visit: 0  Interim History: [name] was on vacation in [place] and she did a lot of walking but also a lot of celebration eating. She is  Trying to get back on track with her eating plan. Her husband is not eating as well and it is easier for her to get off track.  Subjective:   1. Vitamin D deficiency Tayli's Vit D level is not yet at goal, and she requests a refill today.  Assessment/Plan:   1. Vitamin D deficiency Low Vitamin D level contributes to fatigue and are associated with obesity, breast, and colon cancer. We will refill prescription Vitamin D for 1 month. She will follow-up for routine testing of Vitamin D, at least 2-3 times per year to avoid over-replacement. We will recheck labs in 4-6 weeks.  - Vitamin D, Ergocalciferol, (DRISDOL) 1.25 MG (50000 UNIT) CAPS capsule; Take 1 capsule (50,000 Units total) by mouth every 7 (seven) days.  Dispense: 4 capsule; Refill: 0  2. Class 2 severe obesity with serious comorbidity and body mass index (BMI) of 35.0 to 35.9 in adult, unspecified obesity type (Ada) Jodene is currently in the action stage of change. As such, her goal is to continue with weight loss efforts. She has agreed to the Category 2 Plan or keeping a food journal and adhering to recommended goals of 1200 calories and 75+ grams of protein daily.   Exercise goals: As is.  Behavioral modification strategies: increasing lean protein intake, better snacking choices and dealing  with family or coworker sabotage.  [place] has agreed to follow-up with our clinic in 3 weeks. She was informed of the importance of frequent follow-up visits to maximize her success with intensive lifestyle modifications for her multiple health conditions.   Objective:   Blood pressure 115/70, pulse 77, temperature 97.9 F (36.6 C), height 5' 5"  (1.651 m), weight 214 lb (97.1 kg), SpO2 98 %. Body mass index is 35.61 kg/m.  General: Cooperative, alert, well developed, in no acute distress. HEENT: Conjunctivae and lids unremarkable. Cardiovascular: Regular rhythm.  Lungs: Normal work of breathing. Neurologic: No focal deficits.   Lab Results  Component Value Date   CREATININE 0.70 10/09/2019   BUN 19 10/09/2019   NA 144 10/09/2019   K 4.2 10/09/2019   CL 109 (H) 10/09/2019   CO2 23 10/09/2019   Lab Results  Component Value Date   ALT 17 10/09/2019   AST 21 10/09/2019   ALKPHOS 73 10/09/2019   BILITOT 0.2 10/09/2019   Lab Results  Component Value Date   HGBA1C 5.6 10/09/2019   HGBA1C 5.8 (H) 07/05/2019   HGBA1C 5.6 12/05/2018   HGBA1C 5.6 04/27/2018   HGBA1C 6.0 (H) 12/01/2017   Lab Results  Component Value Date   INSULIN 4.3 10/09/2019   INSULIN 8.6 07/05/2019   INSULIN 6.4 12/05/2018   INSULIN 3.6 04/27/2018   INSULIN 5.6 12/01/2017   Lab Results  Component Value Date   TSH 0.93 04/27/2019  Lab Results  Component Value Date   CHOL 186 07/05/2019   HDL 74 07/05/2019   LDLCALC 100 (H) 07/05/2019   TRIG 64 07/05/2019   CHOLHDL 3 03/05/2016   Lab Results  Component Value Date   WBC 2.8 (L) 07/05/2019   HGB 13.2 07/05/2019   HCT 41.1 07/05/2019   MCV 94 07/05/2019   PLT 203 07/05/2019   Lab Results  Component Value Date   IRON 43 (L) 04/26/2018   TIBC 249 (L) 04/26/2018   FERRITIN 60 04/26/2018   Attestation Statements:   Reviewed by clinician on day of visit: allergies, medications, problem list, medical history, surgical history, family  history, social history, and previous encounter notes.  Time spent on visit including pre-visit chart review and post-visit care and charting was 32 minutes.    I, [name], am acting as transcriptionist for [name], MD.  I have reviewed the above documentation for accuracy and completeness, and I agree with the above. -  [name], MD

## 2019-11-08 NOTE — Patient Instructions (Addendum)
voltaren gel/diclofenac 4x per day as needed right thumb  Results for LORELLA, GOMEZ (MRN [id number]) as of 11/08/2019 14:21  Ref. Range 04/26/2018 16:02 01/18/2019 15:33 04/27/2019 14:49 07/05/2019 13:29  WBC Latest Ref Range: 3.4 - 10.8 x10E3/uL 4.5 3.7 (L) 3.4 (L) 2.8 (L)  RBC Latest Ref Range: 3.77 - 5.28 x10E6/uL 4.19 4.07 4.09 4.37  Hemoglobin Latest Ref Range: 11.1 - 15.9 g/dL 12.9 12.6 12.8 13.2  HCT Latest Ref Range: 34.0 - 46.6 % 38.3 38.4 38.3 41.1  MCV Latest Ref Range: 79 - 97 fL 91.2 94.4 93.6 94  MCH Latest Ref Range: 26.6 - 33.0 pg    30.2  MCHC Latest Ref Range: 31 - 35 g/dL 33.7 33.0 33.5 32.1  RDW Latest Ref Range: 11.7 - 15.4 % 16.7 (H) 15.5 15.3 14.5  Platelets Latest Ref Range: 150 - 450 x10E3/uL 248.0 219.0 192.0 203    Leukopenia Leukopenia is a condition in which you have a low number of white blood cells. White blood cells help the body to fight infections. The number of white blood cells in the body varies from person to person. There are five types of white blood cells. Two types (lymphocytes and neutrophils) make up most of the white blood cell count. When lymphocytes are low, the condition is called lymphocytopenia. When neutrophils are low, it is called neutropenia. Neutropenia is the most dangerous type of leukopenia because it can lead to dangerous infections. What are the causes? This condition is commonly caused by damage to soft tissue inside of the bones (bone marrow), which is where most white blood cells are made. Bone marrow can get damaged by:  Medicine or X-ray treatments for cancer (chemotherapy or radiation therapy).  Serious infections.  Cancer of the white blood cells (leukemia, lymphoma, or myeloma).  Medicines, including: ? Certain antibiotics. ? Certain heart medicines. ? Steroids. ? Certain medicines used to treat diseases of the immune system (autoimmune diseases), like rheumatoid arthritis. Leukopenia also happens when white blood cells  are destroyed after leaving the bone marrow, which may result from:  Liver disease.  Autoimmune disease.  Vitamin B deficiencies. What are the signs or symptoms? One of the most common signs of leukopenia, especially severe neutropenia, is having a lot of bacterial infections. Different infections have different symptoms. An infection in your lungs may cause coughing. A urinary tract infection may cause frequent urination and a burning sensation. You may also get infections of the blood, skin, rectum, throat, sinuses, or ears. Some people have no symptoms. If you do have symptoms, they may include:  Fever.  Fatigue.  Swollen glands (lymph nodes).  Painful mouth ulcers.  Gum disease. How is this diagnosed? This condition may be diagnosed based on:  Your medical history.  A physical exam to check for swollen lymph nodes and an enlarged spleen. Your spleen is an organ on the left side of your body that stores white blood cells.  Tests, such as: ? A complete blood count. This blood test counts each type of white cell. ? Bone marrow aspiration. Some bone marrow is removed to be checked under a microscope. ? Lymph node biopsy. Some lymph node tissue is removed to be checked under a microscope. ? Other types of blood tests or imaging tests. How is this treated? Treatment of leukopenia depends on the cause. Some common treatments include:  Antibiotic medicine to treat bacterial infections.  Stopping medicines that may cause leukopenia.  Medicines to stimulate neutrophil production (hematopoietic growth factors),  to treat neutropenia. Follow these instructions at home:  Take over-the-counter and prescription medicines only as told by your health care provider. This includes supplements and vitamins.  If you were prescribed an antibiotic medicine, take it as told by your health care provider. Do not stop taking the antibiotic even if you start to feel better.  Preventing infection  is important if you have leukopenia. To prevent infection: ? Avoid close contact with sick people. ? Wash your hands frequently with soap and water. If soap and water are not available, use hand sanitizer. ? Do not eat uncooked or undercooked meats. ? Wash fruits and vegetables before eating them. ? Do not eat or drink unpasteurized dairy products. ? Get regular dental care, and maintain good dental hygiene. You should visit the dentist at least once every 6 months.  Keep all follow-up visits as told by your health care provider. This is important. Contact a health care provider if:  You have chills or a fever.  You have symptoms of an infection. Get help right away if:  You have a fever that lasts for more than 2-3 days.  You have symptoms that last for more than 2-3 days.  You have trouble breathing.  You have chest pain. This information is not intended to replace advice given to you by your health care provider. Make sure you discuss any questions you have with your health care provider. Document Revised: 01/29/2017 Document Reviewed: 01/07/2016 Elsevier Patient Education  2020 [employer].

## 2019-11-09 LAB — CBC WITH DIFFERENTIAL/PLATELET
Basophils Absolute: 0 10*3/uL (ref 0.0–0.1)
Basophils Relative: 0.9 % (ref 0.0–3.0)
Eosinophils Absolute: 0.1 10*3/uL (ref 0.0–0.7)
Eosinophils Relative: 2.4 % (ref 0.0–5.0)
HCT: 39 % (ref 36.0–46.0)
Hemoglobin: 12.8 g/dL (ref 12.0–15.0)
Lymphocytes Relative: 24.7 % (ref 12.0–46.0)
Lymphs Abs: 1 10*3/uL (ref 0.7–4.0)
MCHC: 32.9 g/dL (ref 30.0–36.0)
MCV: 95.8 fl (ref 78.0–100.0)
Monocytes Absolute: 0.4 10*3/uL (ref 0.1–1.0)
Monocytes Relative: 10.6 % (ref 3.0–12.0)
Neutro Abs: 2.4 10*3/uL (ref 1.4–7.7)
Neutrophils Relative %: 61.4 % (ref 43.0–77.0)
Platelets: 204 10*3/uL (ref 150.0–400.0)
RBC: 4.07 Mil/uL (ref 3.87–5.11)
RDW: 15.4 % (ref 11.5–15.5)
WBC: 3.9 10*3/uL — ABNORMAL LOW (ref 4.0–10.5)

## 2019-11-12 ENCOUNTER — Other Ambulatory Visit: Payer: Self-pay | Admitting: Internal Medicine

## 2019-11-13 ENCOUNTER — Telehealth: Payer: Self-pay | Admitting: Internal Medicine

## 2019-11-13 DIAGNOSIS — E669 Obesity, unspecified: Secondary | ICD-10-CM | POA: Insufficient documentation

## 2019-11-13 NOTE — Telephone Encounter (Signed)
Please need latest copy of mammogram [facility]   Thanks [employer]

## 2019-11-14 ENCOUNTER — Telehealth: Payer: Self-pay | Admitting: Hematology

## 2019-11-14 NOTE — Telephone Encounter (Signed)
Received a new hem referral from Dr. Jacklynn Lewis for leukpenia. Heather [name] has returned my call and scheduled to see Dr. Irene Limbo on 10/7 at 11am. Pt aware to arrive 30 minutes early.

## 2019-11-14 NOTE — Telephone Encounter (Signed)
Request sent via Epic routing

## 2019-11-15 ENCOUNTER — Encounter: Payer: Self-pay | Admitting: Internal Medicine

## 2019-11-27 ENCOUNTER — Ambulatory Visit (INDEPENDENT_AMBULATORY_CARE_PROVIDER_SITE_OTHER): Payer: Medicare Other

## 2019-11-27 VITALS — Ht 65.0 in | Wt 216.0 lb

## 2019-11-27 DIAGNOSIS — Z0000 Encounter for general adult medical examination without abnormal findings: Secondary | ICD-10-CM | POA: Diagnosis not present

## 2019-11-27 NOTE — Progress Notes (Signed)
Subjective:   Heather [name] is a 74 y.o. female who presents for Medicare Annual (Subsequent) preventive examination.  Review of Systems    No ROS.  Medicare Wellness Virtual Visit.    Cardiac Risk Factors include: advanced age (>36mn, >>36women);hypertension;diabetes mellitus     Objective:    Today's Vitals   11/27/19 1139  Weight: 216 lb (98 kg)  Height: 5' 5"  (1.651 m)   Body mass index is 35.94 kg/m.  Advanced Directives 11/27/2019 11/24/2018 03/16/2017 03/15/2017 06/18/2016 03/05/2016 02/15/2015  Does Patient Have a Medical Advance Directive? No No No No No No No  Would patient like information on creating a medical advance directive? No - Patient declined Yes (MAU/Ambulatory/Procedural Areas - Information given) No - Patient declined - - - -    Current Medications (verified) Outpatient Encounter Medications as of 11/27/2019  Medication Sig   acetaminophen (TYLENOL) 500 MG tablet Take 650 mg by mouth 3 (three) times daily as needed for pain.    Calcium Carbonate-Vitamin D (CALCIUM 600 + D PO) Take 2 tablets by mouth daily.     cetirizine (ZYRTEC) 10 MG tablet Take 10 mg by mouth daily.   clopidogrel (PLAVIX) 75 MG tablet TAKE 1 TABLET DAILY   Cyanocobalamin 1000 MCG CAPS Take 1 capsule by mouth daily.   Ferrous Sulfate (IRON) 325 (65 FE) MG TABS Take 1 tablet by mouth daily.     furosemide (LASIX) 40 MG tablet Take 1 tablet (40 mg total) by mouth daily as needed.   gabapentin (NEURONTIN) 100 MG capsule Take 2 capsules (200 mg total) by mouth at bedtime.   glucosamine-chondroitin 500-400 MG tablet Take 1 tablet by mouth 2 (two) times daily.     ipratropium (ATROVENT) 0.06 % nasal spray Place 2 sprays into both nostrils 4 (four) times daily.   Multiple Vitamin (MULTIVITAMIN) tablet Take 1 tablet by mouth daily.     naproxen sodium (ANAPROX) 220 MG tablet Take 220 mg by mouth as needed.   Omega-3 Fatty Acids (FISH OIL) 1000 MG CAPS Take 1 capsule by mouth  daily.   pantoprazole (PROTONIX) 20 MG tablet Take 1 tablet (20 mg total) by mouth daily. 30 minutes before food   polyethylene glycol (MIRALAX / GLYCOLAX) 17 g packet Take 17 g by mouth daily as needed.   potassium chloride SA (KLOR-CON M20) 20 MEQ tablet Take 1 tablet (20 mEq total) by mouth daily.   pyridOXINE (VITAMIN B-6) 100 MG tablet Take 200 mg by mouth daily.   rosuvastatin (CRESTOR) 40 MG tablet Take 1 tablet (40 mg total) by mouth daily. At night   SF 5000 PLUS 1.1 % CREA dental cream    SUMAtriptan (IMITREX) 20 MG/ACT nasal spray sumatriptan 20 mg/actuation nasal spray   topiramate (TOPAMAX) 100 MG tablet Take 1 tablet (100 mg total) by mouth 2 (two) times daily.   Turmeric 500 MG TABS Take 2 tablets by mouth daily.   Vitamin D, Ergocalciferol, (DRISDOL) 1.25 MG (50000 UNIT) CAPS capsule Take 1 capsule (50,000 Units total) by mouth every 7 (seven) days.   vitamin E 200 UNIT capsule Take 200 Units by mouth daily.   No facility-administered encounter medications on file as of 11/27/2019.    Allergies (verified) Aspirin, Coconut oil, Lisinopril, Metoprolol, Peanut-containing drug products, Penicillins, Prednisone, and Strawberry extract   History: Past Medical History:  Diagnosis Date   Abdominal hernia    Abnormal Pap smear    ALLERGIC RHINITIS 10/22/2006   Allergy  ANEMIA 12/18/2008   Arthritis    scoliosis moderate deg changes lumbar Xray 11/04/17    ASYMPTOMATIC POSTMENOPAUSAL STATUS 11/22/2007   Back pain    CAD (coronary artery disease)    in LAD   Complication of anesthesia    Degenerative arthritis    DIABETES MELLITUS, TYPE II 01/13/2007   Diverticulosis    Dyslipidemia    Fibroid    Frequent headaches    GERD 07/20/2007   GOITER, MULTINODULAR 07/20/2007   Headache(784.0) 07/20/2007   HEARING LOSS 11/22/2007   Hiatal hernia    Hiatal hernia    large   History of chicken pox    Hx of colposcopy with cervical biopsy     HYPERCHOLESTEROLEMIA 01/13/2007   Hyperglycemia    HYPERTENSION 10/22/2006   Lung nodule    unchanged since 05/26/13    Migraines    NASH (nonalcoholic steatohepatitis)    Nocturnal hypoxemia 02/17/2013   Obesity    Obstructive sleep apnea    OSA on CPAP    per neurology   OSTEOARTHRITIS 10/22/2006   Other chronic nonalcoholic liver disease 9/38/1829   Sedimentation rate elevation    Sleep apnea    on cpap   Urine incontinence    UTI (urinary tract infection)    Past Surgical History:  Procedure Laterality Date   BREAST SURGERY  1984   Breast reduction b/l    CATARACT EXTRACTION, BILATERAL     DEXA  08/2005   DILATION AND CURETTAGE OF UTERUS     ELECTROCARDIOGRAM  10/15/2006   ESOPHAGOGASTRODUODENOSCOPY  12/08/2005   FOOT SURGERY     hammertoe and bunion 05/2017    Stress Cardiolite  10/21/2005   sweat gland removal     WISDOM TOOTH EXTRACTION     Family History  Problem Relation Age of Onset   Asthma Mother    Depression Mother    Bipolar disorder Mother    Dementia Mother    Arthritis Mother    Breast cancer Mother        primary   Colon cancer Mother        mets from breast   Cancer Mother        colon   Hypertension Father    Migraines Father    Cancer Maternal Aunt        ?   Heart disease Maternal Grandmother    Cancer Maternal Grandfather        stomach ?   Social History   Socioeconomic History   Marital status: Married    Spouse name: [name]   Number of children: 2   Years of education: College   Highest education level: Not on file  Occupational History   Occupation: Retired  Tobacco Use   Smoking status: Former Smoker    Packs/day: 2.00    Years: 20.00    Pack years: 40.00    Types: Cigarettes    Quit date: 03/03/1979    Years since quitting: 40.7   Smokeless tobacco: Never Used  Substance and Sexual Activity   Alcohol use: Yes    Comment: rare   Drug use: No   Sexual activity: Yes     Birth control/protection: Surgical  Other Topics Concern   Not on file  Social History Narrative   Patient is married [name]).   Patient has two children; 3 pregnancies 2 live births    Patient is retired Designer, jewellery, Chiropodist    Patient has a  college education.   Patient is right-handed.   Patient lives at home with family.   Caffeine Use: 2 soda every other day   Former smoker 20+ years ago 2 ppd quit in 40s   No guns, wears seat belt, safe in relationship    Social Determinants of Radio broadcast assistant Strain: Low Risk    Difficulty of Paying Living Expenses: Not hard at all  Food Insecurity: No Food Insecurity   Worried About Charity fundraiser in the Last Year: Never true   Arboriculturist in the Last Year: Never true  Transportation Needs: No Transportation Needs   Lack of Transportation (Medical): No   Lack of Transportation (Non-Medical): No  Physical Activity:    Days of Exercise per Week: Not on file   Minutes of Exercise per Session: Not on file  Stress: No Stress Concern Present   Feeling of Stress : Not at all  Social Connections: Unknown   Frequency of Communication with Friends and Family: Not on file   Frequency of Social Gatherings with Friends and Family: Not on file   Attends Religious Services: Not on Electrical engineer or Organizations: Not on file   Attends Archivist Meetings: Not on file   Marital Status: Married    Tobacco Counseling Counseling given: Not Answered   Clinical Intake:  Pre-visit preparation completed: Yes        Diabetes: Yes (Followed by pcp)  How often do you need to have someone help you when you read instructions, pamphlets, or other written materials from your doctor or pharmacy?: 1 - Never Interpreter Needed?: No      Activities of Daily Living In your present state of health, do you have any difficulty performing the  following activities: 11/27/2019  Hearing? N  Vision? N  Difficulty concentrating or making decisions? N  Walking or climbing stairs? Y  Comment Unsteady gait. Cane/walker in use as needed.  Dressing or bathing? N  Doing errands, shopping? N  Preparing Food and eating ? N  Using the Toilet? N  In the past six months, have you accidently leaked urine? Y  Comment Managed with daily pad.  Do you have problems with loss of bowel control? N  Managing your Medications? Y  Managing your Finances? Y  Housekeeping or managing your Housekeeping? Y  Comment Cleaning crew once monthly. Paces self.  Some recent data might be hidden    Patient Care Team: McLean-Scocuzza, [name], MD as PCP - General (Internal Medicine) Kathrynn Ducking, MD (Neurology) Haygood, Seymour Bars, MD (Inactive) (Obstetrics and Gynecology) [place], Heather, [place] (Optometry) Brand Males, MD as Consulting Physician (Pulmonary Disease) Lahoma Rocker, MD as Referring Physician (Rheumatology) Larey Dresser, MD as Consulting Physician (Cardiology)  Indicate any recent Medical Services you may have received from other than [facility] providers in the past year (date may be approximate).     Assessment:   This is a routine wellness examination for Shakiara.  I connected with Kamilah today by telephone and verified that I am speaking with the correct person using two identifiers. Location patient: home Location provider: work Persons participating in the virtual visit: patient, Marine scientist.    I discussed the limitations, risks, security and privacy concerns of performing an evaluation and management service by telephone and the availability of in person appointments. The patient expressed understanding and verbally consented to this telephonic visit.    Interactive audio and Careers information officer  were attempted between this provider and patient, however failed, due to patient having technical difficulties OR patient did not have  access to video capability.  We continued and completed visit with audio only.  Some vital signs may be absent or patient reported.   Hearing/Vision screen  Hearing Screening   125Hz  250Hz  500Hz  1000Hz  2000Hz  3000Hz  4000Hz  6000Hz  8000Hz   Right ear:           Left ear:           Comments: Patient is able to hear conversational tones without difficulty.  No issues reported.  Vision Screening Comments: Wears reading glasses  Cataract extraction, bilateral  Visual acuity not assessed, virtual visit.    Dietary issues and exercise activities discussed: Current Exercise Habits: Home exercise routine, Type of exercise: calisthenics, Time (Minutes): 60, Frequency (Times/Week): 3, Weekly Exercise (Minutes/Week): 180, Intensity: Mild Regular diet. Patient tries to have a healthy diet. Scheduled with nutritionist.  Good water intake Goals      Patient Stated     Follow up with Provider as scheduled (pt-stated)      I plan to create a schedule with pain medication to help me stay ahead of the pain and with bladder control.        Depression Screen PHQ 2/9 Scores 11/27/2019 11/08/2019 04/27/2019 11/24/2018 01/05/2018 12/23/2016  PHQ - 2 Score 0 0 0 0 0 3  PHQ- 9 Score - - - - - 10    Fall Risk Fall Risk  11/27/2019 11/08/2019 04/27/2019 01/05/2019 11/24/2018  Falls in the past year? 0 0 0 0 0  Number falls in past yr: 0 0 0 - -  Injury with Fall? - 0 0 - -  Follow up Falls evaluation completed Falls evaluation completed Falls evaluation completed - -   Handrails in use when climbing stairs? Yes Home free of loose throw rugs in walkways, pet beds, electrical cords, etc? Yes  Adequate lighting in your home to reduce risk of falls? Yes   ASSISTIVE DEVICES UTILIZED TO PREVENT FALLS: Use of a cane, walker or w/c? Yes  Grab bars in the bathroom? No  Shower chair or bench in shower? No  Elevated toilet seat or a handicapped toilet? Yes   TIMED UP AND GO: Was the test performed? No . Virtual  visit.   Cognitive Function:  Patient is alert and oriented x3.  Enjoys helping with [employer] and completes crossword puzzles for brain health.    6CIT Screen 11/24/2018  What Year? 0 points  What month? 0 points  What time? 0 points  Count back from 20 0 points  Months in reverse 0 points  Repeat phrase 0 points  Total Score 0   Immunizations Immunization History  Administered Date(s) Administered   Fluad Quad(high Dose 65+) 12/13/2018, 11/08/2019   Influenza Split 12/25/2010, 12/28/2011   Influenza Whole 12/18/2008, 12/03/2009   Influenza, High Dose Seasonal PF 03/05/2016, 12/29/2016, 01/05/2018   Influenza,inj,Quad PF,6+ Mos 01/24/2013, 01/30/2014, 02/15/2015   PFIZER SARS-COV-2 Vaccination 04/06/2019, 04/27/2019   Pneumococcal Conjugate-13 01/30/2014   Pneumococcal Polysaccharide-23 12/18/2008, 01/18/2019   Tdap 01/30/2014   Zoster 01/30/2014   Health Maintenance Health Maintenance  Topic Date Due   URINE MICROALBUMIN  04/27/2019   OPHTHALMOLOGY EXAM  05/02/2019   HEMOGLOBIN A1C  04/10/2020   FOOT EXAM  08/28/2020   COLONOSCOPY  06/18/2021   MAMMOGRAM  07/02/2021   TETANUS/TDAP  01/31/2024   INFLUENZA VACCINE  Completed   DEXA SCAN  Completed   COVID-19 Vaccine  Completed   Hepatitis C Screening  Completed   PNA vac Low Risk Adult  Completed   Dental Screening: Recommended annual dental exams for proper oral hygiene.   Community Resource Referral / Chronic Care Management: CRR required this visit?  No   CCM required this visit?  No      Plan:   Keep all routine maintenance appointments.   Follow up 03/12/20  I have personally reviewed and noted the following in the patients chart:    Medical and social history  Use of alcohol, tobacco or illicit drugs   Current medications and supplements  Functional ability and status  Nutritional status  Physical activity  Advanced directives  List of other  physicians  Hospitalizations, surgeries, and ER visits in previous 12 months  Vitals  Screenings to include cognitive, depression, and falls  Referrals and appointments  In addition, I have reviewed and discussed with patient certain preventive protocols, quality metrics, and best practice recommendations. A written personalized care plan for preventive services as well as general preventive health recommendations were provided to patient via mycart.     Varney Biles, LPN   1/65/7903

## 2019-11-27 NOTE — Patient Instructions (Addendum)
Heather [name] , Thank you for taking time to come for your Medicare Wellness Visit. I appreciate your ongoing commitment to your health goals. Please review the following plan we discussed and let me know if I can assist you in the future.   These are the goals we discussed: Goals      Patient Stated     Follow up with Provider as scheduled (pt-stated)      I plan to create a schedule with pain medication to help me stay ahead of the pain and with bladder control.         This is a list of the screening recommended for you and due dates:  Health Maintenance  Topic Date Due   Urine Protein Check  04/27/2019   Eye exam for diabetics  05/02/2019   Hemoglobin A1C  04/10/2020   Complete foot exam   08/28/2020   Colon Cancer Screening  06/18/2021   Mammogram  07/02/2021   Tetanus Vaccine  01/31/2024   Flu Shot  Completed   DEXA scan (bone density measurement)  Completed   COVID-19 Vaccine  Completed    Hepatitis C: One time screening is recommended by Center for Disease Control  (CDC) for  adults born from [birth year] through 1964.   Completed   Pneumonia vaccines  Completed    Immunizations Immunization History  Administered Date(s) Administered   Fluad Quad(high Dose 65+) 12/13/2018, 11/08/2019   Influenza Split 12/25/2010, 12/28/2011   Influenza Whole 12/18/2008, 12/03/2009   Influenza, High Dose Seasonal PF 03/05/2016, 12/29/2016, 01/05/2018   Influenza,inj,Quad PF,6+ Mos 01/24/2013, 01/30/2014, 02/15/2015   PFIZER SARS-COV-2 Vaccination 04/06/2019, 04/27/2019   Pneumococcal Conjugate-13 01/30/2014   Pneumococcal Polysaccharide-23 12/18/2008, 01/18/2019   Tdap 01/30/2014   Zoster 01/30/2014   Keep all routine maintenance appointments.   Follow up 03/12/20  Advanced directives:   Follow up in one year for your annual wellness visit.    Preventive Care 2 Years and Older, Female Preventive care refers to lifestyle choices and visits with your health  care provider that can promote health and wellness. What does preventive care include?  A yearly physical exam. This is also called an annual well check.  Dental exams once or twice a year.  Routine eye exams. Ask your health care provider how often you should have your eyes checked.  Personal lifestyle choices, including:  Daily care of your teeth and gums.  Regular physical activity.  Eating a healthy diet.  Avoiding tobacco and drug use.  Limiting alcohol use.  Practicing safe sex.  Taking low-dose aspirin every day.  Taking vitamin and mineral supplements as recommended by your health care provider. What happens during an annual well check? The services and screenings done by your health care provider during your annual well check will depend on your age, overall health, lifestyle risk factors, and family history of disease. Counseling  Your health care provider may ask you questions about your:  Alcohol use.  Tobacco use.  Drug use.  Emotional well-being.  Home and relationship well-being.  Sexual activity.  Eating habits.  History of falls.  Memory and ability to understand (cognition).  Work and work Statistician.  Reproductive health. Screening  You may have the following tests or measurements:  Height, weight, and BMI.  Blood pressure.  Lipid and cholesterol levels. These may be checked every 5 years, or more frequently if you are over 48 years old.  Skin check.  Lung cancer screening. You may have this screening every year  starting at age 18 if you have a 30-pack-year history of smoking and currently smoke or have quit within the past 15 years.  Fecal occult blood test (FOBT) of the stool. You may have this test every year starting at age 17.  Flexible sigmoidoscopy or colonoscopy. You may have a sigmoidoscopy every 5 years or a colonoscopy every 10 years starting at age 66.  Hepatitis C blood test.  Hepatitis B blood test.  Sexually  transmitted disease (STD) testing.  Diabetes screening. This is done by checking your blood sugar (glucose) after you have not eaten for a while (fasting). You may have this done every 1-3 years.  Bone density scan. This is done to screen for osteoporosis. You may have this done starting at age 56.  Mammogram. This may be done every 1-2 years. Talk to your health care provider about how often you should have regular mammograms. Talk with your health care provider about your test results, treatment options, and if necessary, the need for more tests. Vaccines  Your health care provider may recommend certain vaccines, such as:  Influenza vaccine. This is recommended every year.  Tetanus, diphtheria, and acellular pertussis (Tdap, Td) vaccine. You may need a Td booster every 10 years.  Zoster vaccine. You may need this after age 50.  Pneumococcal 13-valent conjugate (PCV13) vaccine. One dose is recommended after age 60.  Pneumococcal polysaccharide (PPSV23) vaccine. One dose is recommended after age 62. Talk to your health care provider about which screenings and vaccines you need and how often you need them. This information is not intended to replace advice given to you by your health care provider. Make sure you discuss any questions you have with your health care provider. Document Released: 03/15/2015 Document Revised: 11/06/2015 Document Reviewed: 12/18/2014 Elsevier Interactive Patient Education  2017 [place] Prevention in the Home Falls can cause injuries. They can happen to people of all ages. There are many things you can do to make your home safe and to help prevent falls. What can I do on the outside of my home?  Regularly fix the edges of walkways and driveways and fix any cracks.  Remove anything that might make you trip as you walk through a door, such as a raised step or threshold.  Trim any bushes or trees on the path to your home.  Use bright outdoor  lighting.  Clear any walking paths of anything that might make someone trip, such as rocks or tools.  Regularly check to see if handrails are loose or broken. Make sure that both sides of any steps have handrails.  Any raised decks and porches should have guardrails on the edges.  Have any leaves, snow, or ice cleared regularly.  Use sand or salt on walking paths during winter.  Clean up any spills in your garage right away. This includes oil or grease spills. What can I do in the bathroom?  Use night lights.  Install grab bars by the toilet and in the tub and shower. Do not use towel bars as grab bars.  Use non-skid mats or decals in the tub or shower.  If you need to sit down in the shower, use a plastic, non-slip stool.  Keep the floor dry. Clean up any water that spills on the floor as soon as it happens.  Remove soap buildup in the tub or shower regularly.  Attach bath mats securely with double-sided non-slip rug tape.  Do not have throw rugs and other  things on the floor that can make you trip. What can I do in the bedroom?  Use night lights.  Make sure that you have a light by your bed that is easy to reach.  Do not use any sheets or blankets that are too big for your bed. They should not hang down onto the floor.  Have a firm chair that has side arms. You can use this for support while you get dressed.  Do not have throw rugs and other things on the floor that can make you trip. What can I do in the kitchen?  Clean up any spills right away.  Avoid walking on wet floors.  Keep items that you use a lot in easy-to-reach places.  If you need to reach something above you, use a strong step stool that has a grab bar.  Keep electrical cords out of the way.  Do not use floor polish or wax that makes floors slippery. If you must use wax, use non-skid floor wax.  Do not have throw rugs and other things on the floor that can make you trip. What can I do with my  stairs?  Do not leave any items on the stairs.  Make sure that there are handrails on both sides of the stairs and use them. Fix handrails that are broken or loose. Make sure that handrails are as long as the stairways.  Check any carpeting to make sure that it is firmly attached to the stairs. Fix any carpet that is loose or worn.  Avoid having throw rugs at the top or bottom of the stairs. If you do have throw rugs, attach them to the floor with carpet tape.  Make sure that you have a light switch at the top of the stairs and the bottom of the stairs. If you do not have them, ask someone to add them for you. What else can I do to help prevent falls?  Wear shoes that:  Do not have high heels.  Have rubber bottoms.  Are comfortable and fit you well.  Are closed at the toe. Do not wear sandals.  If you use a stepladder:  Make sure that it is fully opened. Do not climb a closed stepladder.  Make sure that both sides of the stepladder are locked into place.  Ask someone to hold it for you, if possible.  Clearly mark and make sure that you can see:  Any grab bars or handrails.  First and last steps.  Where the edge of each step is.  Use tools that help you move around (mobility aids) if they are needed. These include:  Canes.  Walkers.  Scooters.  Crutches.  Turn on the lights when you go into a dark area. Replace any light bulbs as soon as they burn out.  Set up your furniture so you have a clear path. Avoid moving your furniture around.  If any of your floors are uneven, fix them.  If there are any pets around you, be aware of where they are.  Review your medicines with your doctor. Some medicines can make you feel dizzy. This can increase your chance of falling. Ask your doctor what other things that you can do to help prevent falls. This information is not intended to replace advice given to you by your health care provider. Make sure you discuss any  questions you have with your health care provider. Document Released: 12/13/2008 Document Revised: 07/25/2015 Document Reviewed: 03/23/2014 Elsevier Interactive Patient  Education  2017 [employer].

## 2019-11-29 ENCOUNTER — Encounter (INDEPENDENT_AMBULATORY_CARE_PROVIDER_SITE_OTHER): Payer: Self-pay | Admitting: Family Medicine

## 2019-11-29 ENCOUNTER — Other Ambulatory Visit: Payer: Self-pay

## 2019-11-29 ENCOUNTER — Ambulatory Visit (INDEPENDENT_AMBULATORY_CARE_PROVIDER_SITE_OTHER): Payer: Medicare Other | Admitting: Family Medicine

## 2019-11-29 VITALS — BP 120/72 | HR 64 | Temp 98.1°F | Ht 65.0 in | Wt 222.0 lb

## 2019-11-29 DIAGNOSIS — Z6837 Body mass index (BMI) 37.0-37.9, adult: Secondary | ICD-10-CM | POA: Diagnosis not present

## 2019-11-29 DIAGNOSIS — E559 Vitamin D deficiency, unspecified: Secondary | ICD-10-CM | POA: Diagnosis not present

## 2019-11-29 MED ORDER — VITAMIN D (ERGOCALCIFEROL) 1.25 MG (50000 UNIT) PO CAPS: 50000.0000 [IU] | ORAL_CAPSULE | ORAL | 0 refills | Status: DC

## 2019-11-29 NOTE — Progress Notes (Signed)
Chief Complaint:   OBESITY Heather [name] is here to discuss her progress with her obesity treatment plan along with follow-up of her obesity related diagnoses. Heather [name] is on the Category 2 Plan or keeping a food journal and adhering to recommended goals of 1200 calories and 75+ grams of protein daily and states she is following her eating plan approximately 0% of the time. Heather [name] states she is doing 0 minutes 0 times per week.  Today's visit was #: 36 Starting weight: 268 lbs Starting date: 12/23/2016 Today's weight: 222 lbs Today's date: 11/29/2019 Total lbs lost to date: 54 Total lbs lost since last in-office visit: 0  Interim History: Heather [name] has struggled with following her plan in the last month. She has been dealing with increased stress eating and pain issues. She is not getting support from her family and instead is getting sabotage.  Subjective:   1. Vitamin D deficiency Heather [name] is stable on Vit D, and she denies nausea or vomiting. She requests a refill today.  Assessment/Plan:   1. Vitamin D deficiency Low Vitamin D level contributes to fatigue and are associated with obesity, breast, and colon cancer. We will refill prescription Vitamin D for 1 month. Heather [name] will follow-up for routine testing of Vitamin D, at least 2-3 times per year to avoid over-replacement.  - Vitamin D, Ergocalciferol, (DRISDOL) 1.25 MG (50000 UNIT) CAPS capsule; Take 1 capsule (50,000 Units total) by mouth every 7 (seven) days.  Dispense: 4 capsule; Refill: 0  2. Class 2 severe obesity with serious comorbidity and body mass index (BMI) of 37.0 to 37.9 in adult, unspecified obesity type ([place]) Heather [name] is currently in the action stage of change. As such, her goal is to continue with weight loss efforts. She has agreed to the Category 1 Plan.   Behavioral modification strategies: increasing lean protein intake and dealing with family or coworker sabotage.  Heather [name] has agreed to follow-up with our clinic in 3 weeks.  She was informed of the importance of frequent follow-up visits to maximize her success with intensive lifestyle modifications for her multiple health conditions.   Objective:   Blood pressure 120/72, pulse 64, temperature 98.1 F (36.7 C), height 5' 5"  (1.651 m), weight 222 lb (100.7 kg), SpO2 98 %. Body mass index is 36.94 kg/m.  General: Cooperative, alert, well developed, in no acute distress. HEENT: Conjunctivae and lids unremarkable. Cardiovascular: Regular rhythm.  Lungs: Normal work of breathing. Neurologic: No focal deficits.   Lab Results  Component Value Date   CREATININE 0.70 10/09/2019   BUN 19 10/09/2019   NA 144 10/09/2019   K 4.2 10/09/2019   CL 109 (H) 10/09/2019   CO2 23 10/09/2019   Lab Results  Component Value Date   ALT 17 10/09/2019   AST 21 10/09/2019   ALKPHOS 73 10/09/2019   BILITOT 0.2 10/09/2019   Lab Results  Component Value Date   HGBA1C 5.6 10/09/2019   HGBA1C 5.8 (H) 07/05/2019   HGBA1C 5.6 12/05/2018   HGBA1C 5.6 04/27/2018   HGBA1C 6.0 (H) 12/01/2017   Lab Results  Component Value Date   INSULIN 4.3 10/09/2019   INSULIN 8.6 07/05/2019   INSULIN 6.4 12/05/2018   INSULIN 3.6 04/27/2018   INSULIN 5.6 12/01/2017   Lab Results  Component Value Date   TSH 0.93 04/27/2019   Lab Results  Component Value Date   CHOL 186 07/05/2019   HDL 74 07/05/2019   LDLCALC 100 (H) 07/05/2019   TRIG 64 07/05/2019  CHOLHDL 3 03/05/2016   Lab Results  Component Value Date   WBC 3.9 (L) 11/08/2019   HGB 12.8 11/08/2019   HCT 39.0 11/08/2019   MCV 95.8 11/08/2019   PLT 204.0 11/08/2019   Lab Results  Component Value Date   IRON 43 (L) 04/26/2018   TIBC 249 (L) 04/26/2018   FERRITIN 60 04/26/2018    Obesity Behavioral Intervention:   Approximately 15 minutes were spent on the discussion below.  ASK: We discussed the diagnosis of obesity with Heather [name] today and Heather [name] agreed to give [name] permission to discuss obesity behavioral  modification therapy today.  ASSESS: Heather [name] has the diagnosis of obesity and her BMI today is 36.94. Heather [name] is in the action stage of change.   ADVISE: Heather [name] was educated on the multiple health risks of obesity as well as the benefit of weight loss to improve her health. She was advised of the need for long term treatment and the importance of lifestyle modifications to improve her current health and to decrease her risk of future health problems.  AGREE: Multiple dietary modification options and treatment options were discussed and Heather [name] agreed to follow the recommendations documented in the above note.  ARRANGE: Heather [name] was educated on the importance of frequent visits to treat obesity as outlined per CMS and USPSTF guidelines and agreed to schedule her next follow up appointment today.  Attestation Statements:   Reviewed by clinician on day of visit: allergies, medications, problem list, medical history, surgical history, family history, social history, and previous encounter notes.   I, [name], am acting as transcriptionist for [name], MD.  I have reviewed the above documentation for accuracy and completeness, and I agree with the above. -  [name], MD

## 2019-11-30 ENCOUNTER — Encounter: Payer: Self-pay | Admitting: Podiatry

## 2019-11-30 ENCOUNTER — Ambulatory Visit (INDEPENDENT_AMBULATORY_CARE_PROVIDER_SITE_OTHER): Payer: Medicare Other | Admitting: Podiatry

## 2019-11-30 DIAGNOSIS — D689 Coagulation defect, unspecified: Secondary | ICD-10-CM

## 2019-11-30 DIAGNOSIS — M79676 Pain in unspecified toe(s): Secondary | ICD-10-CM

## 2019-11-30 DIAGNOSIS — B351 Tinea unguium: Secondary | ICD-10-CM

## 2019-11-30 NOTE — Progress Notes (Signed)
She presents today chief complaint of pain in limb secondary to long thick painful toenails as well as calluses to the medial aspect of her left foot.  Objective: Vital signs are stable alert oriented x3 pulses are palpable.  No open lesions or wounds reactive hyper keratomas plantar medial aspect of the first metatarsal and the hallux secondary to hallux valgus deformity.  Also has long thick yellow dystrophic-like mycotic nails painful palpation as well as debridement.  Assessment: Pain in limb secondary to onychomycosis and reactive hyperkeratotic tissue.  Plan: Debridement of toenails 1 through 5 bilateral.

## 2019-12-05 ENCOUNTER — Other Ambulatory Visit: Payer: Self-pay

## 2019-12-05 ENCOUNTER — Ambulatory Visit (INDEPENDENT_AMBULATORY_CARE_PROVIDER_SITE_OTHER): Payer: Medicare Other | Admitting: Family Medicine

## 2019-12-05 ENCOUNTER — Encounter: Payer: Self-pay | Admitting: Family Medicine

## 2019-12-05 DIAGNOSIS — M170 Bilateral primary osteoarthritis of knee: Secondary | ICD-10-CM | POA: Diagnosis not present

## 2019-12-05 DIAGNOSIS — I2510 Atherosclerotic heart disease of native coronary artery without angina pectoris: Secondary | ICD-10-CM | POA: Diagnosis not present

## 2019-12-05 MED ORDER — GABAPENTIN 100 MG PO CAPS: 200.0000 mg | ORAL_CAPSULE | Freq: Every day | ORAL | 0 refills | Status: DC

## 2019-12-05 NOTE — Progress Notes (Signed)
[facility] [employer] Phone: [phone] Subjective:   Heather [name], am serving as a scribe for Dr. Hulan Saas. This visit occurred during the SARS-CoV-2 public health emergency.  Safety protocols were in place, including screening questions prior to the visit, additional usage of staff PPE, and extensive cleaning of exam room while observing appropriate contact time as indicated for disinfecting solutions.   I'm seeing this patient by the request  of:  McLean-Scocuzza, [name], MD  CC: Bilateral knee pain  [id number]   10/11/2019 Patient is to discuss his right thumb pain.  Seems to be more secondary to a tendinitis.  Could be potentially a early trigger thumb.  Thumb spica given today.  Will wear at night at least.  Follow-up again in 4 to 8 weeks  Repeat injections given today.  Tolerated the procedure well.  Patient still wants to avoid any surgical intervention.  Continuing to attempt to lose weight and work with healthy weight and wellness.  Chronic problem with exacerbation and will be traveling.  Increase activity slowly.  Follow-up again 4 to 8 weeks.  Update 12/05/2019 Heather [name] is a 74 y.o. female coming in with complaint of right thumb pain and bilateral knee pain. Patient states that her knee pain is ok but would like to get injections so that her pain does not increase. Patient notes having pain everywhere in her body.   Thumb pain has improved but she is now feeling weak. Patient is able to write most of the day where as she was unable to write at all last visit. Using Tylenol and Aleve for her pain as well. Was given Tramadol but has only taken one pill. Would like refill on gabapentin today.  Patient is concerned about taking the tramadol to regular.  Is taking 1000 mg of Tylenol 3 times a day as well as Aleve twice a day.  Denies any stomach pain.  Patient states that she is having aches and pains  everywhere else.  Just 1 week.  Not being able to be very active.  Knows that her diet is part of the problem.      Past Medical History:  Diagnosis Date  . Abdominal hernia   . Abnormal Pap smear   . ALLERGIC RHINITIS 10/22/2006  . Allergy   . ANEMIA 12/18/2008  . Arthritis    scoliosis moderate deg changes lumbar Xray 11/04/17   . ASYMPTOMATIC POSTMENOPAUSAL STATUS 11/22/2007  . Back pain   . CAD (coronary artery disease)    in LAD  . Complication of anesthesia   . Degenerative arthritis   . DIABETES MELLITUS, TYPE II 01/13/2007  . Diverticulosis   . Dyslipidemia   . Fibroid   . Frequent headaches   . GERD 07/20/2007  . GOITER, MULTINODULAR 07/20/2007  . Headache(784.0) 07/20/2007  . HEARING LOSS 11/22/2007  . Hiatal hernia   . Hiatal hernia    large  . History of chicken pox   . Hx of colposcopy with cervical biopsy   . HYPERCHOLESTEROLEMIA 01/13/2007  . Hyperglycemia   . HYPERTENSION 10/22/2006  . Lung nodule    unchanged since 05/26/13   . Migraines   . NASH (nonalcoholic steatohepatitis)   . Nocturnal hypoxemia 02/17/2013  . Obesity   . Obstructive sleep apnea   . OSA on CPAP    per neurology  . OSTEOARTHRITIS 10/22/2006  . Other chronic nonalcoholic liver disease 5/45/6256  . Sedimentation rate  elevation   . Sleep apnea    on cpap  . Urine incontinence   . UTI (urinary tract infection)    Past Surgical History:  Procedure Laterality Date  . BREAST SURGERY  1984   Breast reduction b/l   . CATARACT EXTRACTION, BILATERAL    . DEXA  08/2005  . DILATION AND CURETTAGE OF UTERUS    . ELECTROCARDIOGRAM  10/15/2006  . ESOPHAGOGASTRODUODENOSCOPY  12/08/2005  . FOOT SURGERY     hammertoe and bunion 05/2017   . Stress Cardiolite  10/21/2005  . sweat gland removal    . WISDOM TOOTH EXTRACTION     Social History   Socioeconomic History  . Marital status: Married    Spouse name: [name]  . Number of children: 2  . Years of education: College  . Highest education  level: Not on file  Occupational History  . Occupation: Retired  Tobacco Use  . Smoking status: Former Smoker    Packs/day: 2.00    Years: 20.00    Pack years: 40.00    Types: Cigarettes    Quit date: 03/03/1979    Years since quitting: 40.7  . Smokeless tobacco: Never Used  Substance and Sexual Activity  . Alcohol use: Yes    Comment: rare  . Drug use: [name]  . Sexual activity: Yes    Birth control/protection: Surgical  Other Topics Concern  . Not on file  Social History Narrative   Patient is married [name]).   Patient has two children; 3 pregnancies 2 live births    Patient is retired Designer, jewellery, Chiropodist    Patient has a Financial risk analyst.   Patient is right-handed.   Patient lives at home with family.   Caffeine Use: 2 soda every other day   Former smoker 20+ years ago 2 ppd quit in 47s   [name] guns, wears seat belt, safe in relationship    Social Determinants of Health   Financial Resource Strain: Low Risk   . Difficulty of Paying Living Expenses: Not hard at all  Food Insecurity: [name] Food Insecurity  . Worried About Charity fundraiser in the Last Year: Never true  . Ran Out of Food in the Last Year: Never true  Transportation Needs: [name] Transportation Needs  . Lack of Transportation (Medical): [name]  . Lack of Transportation (Non-Medical): [name]  Physical Activity:   . Days of Exercise per Week: Not on file  . Minutes of Exercise per Session: Not on file  Stress: [name] Stress Concern Present  . Feeling of Stress : Not at all  Social Connections: Unknown  . Frequency of Communication with Friends and Family: Not on file  . Frequency of Social Gatherings with Friends and Family: Not on file  . Attends Religious Services: Not on file  . Active Member of Clubs or Organizations: Not on file  . Attends Archivist Meetings: Not on file  . Marital Status: Married   Allergies  Allergen Reactions  . Aspirin   . Coconut  Oil   . Lisinopril     REACTION: cough  . Metoprolol Itching  . Peanut-Containing Drug Products   . Penicillins     REACTION: rash  . Prednisone   . Strawberry Extract    Family History  Problem Relation Age of Onset  . Asthma Mother   . Depression Mother   . Bipolar disorder Mother   . Dementia Mother   . Arthritis Mother   .  Breast cancer Mother        primary  . Colon cancer Mother        mets from breast  . Cancer Mother        colon  . Hypertension Father   . Migraines Father   . Cancer Maternal Aunt        ?  [name] Heart disease Maternal Grandmother   . Cancer Maternal Grandfather        stomach ?     Current Outpatient Medications (Cardiovascular):  .  furosemide (LASIX) 40 MG tablet, Take 1 tablet (40 mg total) by mouth daily as needed. .  rosuvastatin (CRESTOR) 40 MG tablet, Take 1 tablet (40 mg total) by mouth daily. At night  Current Outpatient Medications (Respiratory):  .  cetirizine (ZYRTEC) 10 MG tablet, Take 10 mg by mouth daily. [name]  ipratropium (ATROVENT) 0.06 % nasal spray, Place 2 sprays into both nostrils 4 (four) times daily.  Current Outpatient Medications (Analgesics):  .  acetaminophen (TYLENOL) 500 MG tablet, Take 650 mg by mouth 3 (three) times daily as needed for pain.  .  naproxen sodium (ANAPROX) 220 MG tablet, Take 220 mg by mouth as needed. .  SUMAtriptan (IMITREX) 20 MG/ACT nasal spray, sumatriptan 20 mg/actuation nasal spray .  traMADol (ULTRAM) 50 MG tablet, Take 50 mg by mouth daily.  Current Outpatient Medications (Hematological):  .  clopidogrel (PLAVIX) 75 MG tablet, TAKE 1 TABLET DAILY .  Cyanocobalamin 1000 MCG CAPS, Take 1 capsule by mouth daily. .  Ferrous Sulfate (IRON) 325 (65 FE) MG TABS, Take 1 tablet by mouth daily.    Current Outpatient Medications (Other):  [name]  Calcium Carbonate-Vitamin D (CALCIUM 600 + D PO), Take 2 tablets by mouth daily.   .  diclofenac Sodium (VOLTAREN) 1 % GEL, Apply topically 4 (four) times  daily. [name]  gabapentin (NEURONTIN) 100 MG capsule, Take 2 capsules (200 mg total) by mouth at bedtime. [name]  glucosamine-chondroitin 500-400 MG tablet, Take 1 tablet by mouth 2 (two) times daily.   .  Menthol, Topical Analgesic, (BIOFREEZE COLORLESS) 4 % GEL, Apply topically. .  Multiple Vitamin (MULTIVITAMIN) tablet, Take 1 tablet by mouth daily.   .  Omega-3 Fatty Acids (FISH OIL) 1000 MG CAPS, Take 1 capsule by mouth daily. .  pantoprazole (PROTONIX) 20 MG tablet, Take 1 tablet (20 mg total) by mouth daily. 30 minutes before food .  polyethylene glycol (MIRALAX / GLYCOLAX) 17 g packet, Take 17 g by mouth daily as needed. .  potassium chloride SA (KLOR-CON M20) 20 MEQ tablet, Take 1 tablet (20 mEq total) by mouth daily. [name]  pyridOXINE (VITAMIN B-6) 100 MG tablet, Take 200 mg by mouth daily. .  SF 5000 PLUS 1.1 % CREA dental cream,  .  topiramate (TOPAMAX) 100 MG tablet, Take 1 tablet (100 mg total) by mouth 2 (two) times daily. .  Turmeric 500 MG TABS, Take 2 tablets by mouth daily. .  Vitamin D, Ergocalciferol, (DRISDOL) 1.25 MG (50000 UNIT) CAPS capsule, Take 1 capsule (50,000 Units total) by mouth every 7 (seven) days. .  vitamin E 200 UNIT capsule, Take 200 Units by mouth daily. [name]  gabapentin (NEURONTIN) 100 MG capsule, Take 2 capsules (200 mg total) by mouth at bedtime.   Reviewed prior external information including notes and imaging from  primary care provider As well as notes that were available from care everywhere and other healthcare systems.  Past medical history, social, surgical and family history all  reviewed in electronic medical record.  [name] pertanent information unless stated regarding to the chief complaint.   Review of Systems:  [name] headache, visual changes, nausea, vomiting, diarrhea, constipation, dizziness, abdominal pain, skin rash, fevers, chills, night sweats, weight loss, swollen lymph nodes, chest pain, shortness of breath, mood changes. POSITIVE muscle aches, body  aches, joint swelling  Objective  Blood pressure 110/82, pulse 65, height 5' 5"  (1.651 m), weight 222 lb (100.7 kg), SpO2 99 %.   General: [name] apparent distress alert and oriented x3 mood and affect normal, dressed appropriately.  Overweight HEENT: Pupils equal, extraocular movements intact  Respiratory: Patient's speak in full sentences and does not appear short of breath  Severely antalgic MSK:   Knee: Bilateral valgus deformity noted.  Abnormal thigh to calf ratio.  Tender to palpation over medial and PF joint line.  ROM lacks range of motion of 10 degrees in flexion and extension. instability with valgus force.  painful patellar compression. Patellar glide with severe crepitus. Patellar and quadriceps tendons nontender Hamstring and quadriceps strength is normal.  After informed written and verbal consent, patient was seated on exam table. Right knee was prepped with alcohol swab and utilizing anterolateral approach, patient's right knee space was injected with 4:1  marcaine 0.5%: Kenalog 11m/dL. Patient tolerated the procedure well without immediate complications.  After informed written and verbal consent, patient was seated on exam table. Left knee was prepped with alcohol swab and utilizing anterolateral approach, patient's left knee space was injected with 4:1  marcaine 0.5%: Kenalog 4107mdL. Patient tolerated the procedure well without immediate complications.   Impression and Recommendations:     The above documentation has been reviewed and is accurate and complete ZaLyndal PulleyDO

## 2019-12-05 NOTE — Assessment & Plan Note (Signed)
Bilateral steroid injections given again today.  Patient has a chronic problem with exacerbation.  Secondary to patient's other comorbidities and patient not wanting any surgical intervention this is the best we have her.  We discussed with patient about icing regimen and home exercise, encourage weight loss which patient has tried significantly hard on.  Spent greater than 35 minutes with patient going over every medication and discussing multiple other ailments that are more for her primary care provider.  Follow-up with me again in 2 to 3 months.

## 2019-12-05 NOTE — Patient Instructions (Addendum)
1/2 tramadol with 56m of Tylenol when pain gets bad Tylenol daily dosage should be one 5036mpill, 3x a day Injected knees today Watch sugar intake If pain gets worse lets get labs See me again in 2-3 months

## 2019-12-06 ENCOUNTER — Encounter (INDEPENDENT_AMBULATORY_CARE_PROVIDER_SITE_OTHER): Payer: Self-pay | Admitting: Family Medicine

## 2019-12-06 ENCOUNTER — Encounter: Payer: Self-pay | Admitting: Family Medicine

## 2019-12-06 NOTE — Telephone Encounter (Signed)
please advise.

## 2019-12-07 ENCOUNTER — Other Ambulatory Visit: Payer: Self-pay

## 2019-12-07 ENCOUNTER — Inpatient Hospital Stay: Payer: Medicare Other

## 2019-12-07 ENCOUNTER — Inpatient Hospital Stay: Payer: Medicare Other | Attending: Hematology | Admitting: Hematology

## 2019-12-07 VITALS — BP 134/59 | HR 59 | Temp 98.7°F | Resp 18 | Ht 65.0 in | Wt 219.6 lb

## 2019-12-07 DIAGNOSIS — D509 Iron deficiency anemia, unspecified: Secondary | ICD-10-CM

## 2019-12-07 DIAGNOSIS — Z79899 Other long term (current) drug therapy: Secondary | ICD-10-CM | POA: Diagnosis not present

## 2019-12-07 DIAGNOSIS — D72819 Decreased white blood cell count, unspecified: Secondary | ICD-10-CM | POA: Diagnosis not present

## 2019-12-07 DIAGNOSIS — Z803 Family history of malignant neoplasm of breast: Secondary | ICD-10-CM | POA: Diagnosis not present

## 2019-12-07 DIAGNOSIS — Z8249 Family history of ischemic heart disease and other diseases of the circulatory system: Secondary | ICD-10-CM | POA: Diagnosis not present

## 2019-12-07 DIAGNOSIS — Z23 Encounter for immunization: Secondary | ICD-10-CM

## 2019-12-07 DIAGNOSIS — I10 Essential (primary) hypertension: Secondary | ICD-10-CM | POA: Insufficient documentation

## 2019-12-07 DIAGNOSIS — E119 Type 2 diabetes mellitus without complications: Secondary | ICD-10-CM | POA: Diagnosis not present

## 2019-12-07 DIAGNOSIS — Z800 Family history of malignant neoplasm of digestive organs: Secondary | ICD-10-CM | POA: Diagnosis not present

## 2019-12-07 DIAGNOSIS — I2510 Atherosclerotic heart disease of native coronary artery without angina pectoris: Secondary | ICD-10-CM | POA: Diagnosis not present

## 2019-12-07 DIAGNOSIS — Z87891 Personal history of nicotine dependence: Secondary | ICD-10-CM | POA: Insufficient documentation

## 2019-12-07 LAB — CBC WITH DIFFERENTIAL/PLATELET
Abs Immature Granulocytes: 0.01 10*3/uL (ref 0.00–0.07)
Basophils Absolute: 0 10*3/uL (ref 0.0–0.1)
Basophils Relative: 0 %
Eosinophils Absolute: 0 10*3/uL (ref 0.0–0.5)
Eosinophils Relative: 0 %
HCT: 40.4 % (ref 36.0–46.0)
Hemoglobin: 12.7 g/dL (ref 12.0–15.0)
Immature Granulocytes: 0 %
Lymphocytes Relative: 19 %
Lymphs Abs: 1.2 10*3/uL (ref 0.7–4.0)
MCH: 30.7 pg (ref 26.0–34.0)
MCHC: 31.4 g/dL (ref 30.0–36.0)
MCV: 97.6 fL (ref 80.0–100.0)
Monocytes Absolute: 0.6 10*3/uL (ref 0.1–1.0)
Monocytes Relative: 9 %
Neutro Abs: 4.5 10*3/uL (ref 1.7–7.7)
Neutrophils Relative %: 72 %
Platelets: 203 10*3/uL (ref 150–400)
RBC: 4.14 MIL/uL (ref 3.87–5.11)
RDW: 15.3 % (ref 11.5–15.5)
WBC: 6.3 10*3/uL (ref 4.0–10.5)
nRBC: 0 % (ref 0.0–0.2)

## 2019-12-07 LAB — VITAMIN B12: Vitamin B-12: 3179 pg/mL — ABNORMAL HIGH (ref 180–914)

## 2019-12-07 LAB — FERRITIN: Ferritin: 147 ng/mL (ref 11–307)

## 2019-12-07 LAB — IRON AND TIBC
Iron: 91 ug/dL (ref 41–142)
Saturation Ratios: 36 % (ref 21–57)
TIBC: 255 ug/dL (ref 236–444)
UIBC: 164 ug/dL (ref 120–384)

## 2019-12-07 LAB — CMP (CANCER CENTER ONLY)
ALT: 20 U/L (ref 0–44)
AST: 19 U/L (ref 15–41)
Albumin: 3.6 g/dL (ref 3.5–5.0)
Alkaline Phosphatase: 61 U/L (ref 38–126)
Anion gap: 2 — ABNORMAL LOW (ref 5–15)
BUN: 26 mg/dL — ABNORMAL HIGH (ref 8–23)
CO2: 25 mmol/L (ref 22–32)
Calcium: 9 mg/dL (ref 8.9–10.3)
Chloride: 112 mmol/L — ABNORMAL HIGH (ref 98–111)
Creatinine: 0.8 mg/dL (ref 0.44–1.00)
GFR, Estimated: >60 mL/min (ref >60)
Glucose, Bld: 86 mg/dL (ref 70–99)
Potassium: 4.1 mmol/L (ref 3.5–5.1)
Sodium: 139 mmol/L (ref 135–145)
Total Bilirubin: 0.3 mg/dL (ref 0.3–1.2)
Total Protein: 7.5 g/dL (ref 6.5–8.1)

## 2019-12-07 NOTE — Telephone Encounter (Signed)
Pt still has questions

## 2019-12-07 NOTE — Progress Notes (Signed)
HEMATOLOGY/ONCOLOGY CONSULTATION NOTE  Date of Service: 12/07/2019  Patient Care Team: McLean-Scocuzza, [name], MD as PCP - General (Internal Medicine) Kathrynn Ducking, MD (Neurology) Haygood, Seymour Bars, MD (Inactive) (Obstetrics and Gynecology) [place], Heather, [place] (Optometry) Brand Males, MD as Consulting Physician (Pulmonary Disease) Lahoma Rocker, MD as Referring Physician (Rheumatology) Larey Dresser, MD as Consulting Physician (Cardiology)  CHIEF COMPLAINTS/PURPOSE OF CONSULTATION:  Leukopenia  HISTORY OF PRESENTING ILLNESS:   Heather [name] is a wonderful 74 y.o. female who has been referred to [name] by Dr. Jacklynn Lewis for evaluation and management of leukopenia. Pt is accompanied today by her husband. The pt reports that she is doing well overall.   The pt reports no significant new symptoms in the last 6 months.  Most recent lab results (11/08/2019) of CBC w/diff is as follows: all values are WNL except for WBC at 3.9K. wbc count wa 2.8k in 07/2019.  On review of systems, pt reports no fevers, chills, night sweats, weightloss or new fatigue.  MEDICAL HISTORY:  Past Medical History:  Diagnosis Date  . Abdominal hernia   . Abnormal Pap smear   . ALLERGIC RHINITIS 10/22/2006  . Allergy   . ANEMIA 12/18/2008  . Arthritis    scoliosis moderate deg changes lumbar Xray 11/04/17   . ASYMPTOMATIC POSTMENOPAUSAL STATUS 11/22/2007  . Back pain   . CAD (coronary artery disease)    in LAD  . Complication of anesthesia   . Degenerative arthritis   . DIABETES MELLITUS, TYPE II 01/13/2007  . Diverticulosis   . Dyslipidemia   . Fibroid   . Frequent headaches   . GERD 07/20/2007  . GOITER, MULTINODULAR 07/20/2007  . Headache(784.0) 07/20/2007  . HEARING LOSS 11/22/2007  . Hiatal hernia   . Hiatal hernia    large  . History of chicken pox   . Hx of colposcopy with cervical biopsy   . HYPERCHOLESTEROLEMIA 01/13/2007  . Hyperglycemia   . HYPERTENSION 10/22/2006  . Lung  nodule    unchanged since 05/26/13   . Migraines   . NASH (nonalcoholic steatohepatitis)   . Nocturnal hypoxemia 02/17/2013  . Obesity   . Obstructive sleep apnea   . OSA on CPAP    per neurology  . OSTEOARTHRITIS 10/22/2006  . Other chronic nonalcoholic liver disease 7/93/9030  . Sedimentation rate elevation   . Sleep apnea    on cpap  . Urine incontinence   . UTI (urinary tract infection)     SURGICAL HISTORY: Past Surgical History:  Procedure Laterality Date  . BREAST SURGERY  1984   Breast reduction b/l   . CATARACT EXTRACTION, BILATERAL    . DEXA  08/2005  . DILATION AND CURETTAGE OF UTERUS    . ELECTROCARDIOGRAM  10/15/2006  . ESOPHAGOGASTRODUODENOSCOPY  12/08/2005  . FOOT SURGERY     hammertoe and bunion 05/2017   . Stress Cardiolite  10/21/2005  . sweat gland removal    . WISDOM TOOTH EXTRACTION      SOCIAL HISTORY: Social History   Socioeconomic History  . Marital status: Married    Spouse name: [name]  . Number of children: 2  . Years of education: College  . Highest education level: Not on file  Occupational History  . Occupation: Retired  Tobacco Use  . Smoking status: Former Smoker    Packs/day: 2.00    Years: 20.00    Pack years: 40.00    Types: Cigarettes    Quit date: 03/03/1979  Years since quitting: 40.7  . Smokeless tobacco: Never Used  Substance and Sexual Activity  . Alcohol use: Yes    Comment: rare  . Drug use: No  . Sexual activity: Yes    Birth control/protection: Surgical  Other Topics Concern  . Not on file  Social History Narrative   Patient is married [name]).   Patient has two children; 3 pregnancies 2 live births    Patient is retired Designer, jewellery, Chiropodist    Patient has a Financial risk analyst.   Patient is right-handed.   Patient lives at home with family.   Caffeine Use: 2 soda every other day   Former smoker 20+ years ago 2 ppd quit in 53s   No guns, wears seat belt,  safe in relationship    Social Determinants of Health   Financial Resource Strain: Low Risk   . Difficulty of Paying Living Expenses: Not hard at all  Food Insecurity: No Food Insecurity  . Worried About Charity fundraiser in the Last Year: Never true  . Ran Out of Food in the Last Year: Never true  Transportation Needs: No Transportation Needs  . Lack of Transportation (Medical): No  . Lack of Transportation (Non-Medical): No  Physical Activity:   . Days of Exercise per Week: Not on file  . Minutes of Exercise per Session: Not on file  Stress: No Stress Concern Present  . Feeling of Stress : Not at all  Social Connections: Unknown  . Frequency of Communication with Friends and Family: Not on file  . Frequency of Social Gatherings with Friends and Family: Not on file  . Attends Religious Services: Not on file  . Active Member of Clubs or Organizations: Not on file  . Attends Archivist Meetings: Not on file  . Marital Status: Married  Human resources officer Violence: Not At Risk  . Fear of Current or Ex-Partner: No  . Emotionally Abused: No  . Physically Abused: No  . Sexually Abused: No    FAMILY HISTORY: Family History  Problem Relation Age of Onset  . Asthma Mother   . Depression Mother   . Bipolar disorder Mother   . Dementia Mother   . Arthritis Mother   . Breast cancer Mother        primary  . Colon cancer Mother        mets from breast  . Cancer Mother        colon  . Hypertension Father   . Migraines Father   . Cancer Maternal Aunt        ?  [name] Heart disease Maternal Grandmother   . Cancer Maternal Grandfather        stomach ?    ALLERGIES:  is allergic to aspirin, coconut oil, lisinopril, metoprolol, peanut-containing drug products, penicillins, prednisone, and strawberry extract.  MEDICATIONS:  Current Outpatient Medications  Medication Sig Dispense Refill  . acetaminophen (TYLENOL) 500 MG tablet Take 650 mg by mouth 3 (three) times daily as  needed for pain.     . Calcium Carbonate-Vitamin D (CALCIUM 600 + D PO) Take 2 tablets by mouth daily.      . cetirizine (ZYRTEC) 10 MG tablet Take 10 mg by mouth daily.    . clopidogrel (PLAVIX) 75 MG tablet TAKE 1 TABLET DAILY 90 tablet 3  . Cyanocobalamin 1000 MCG CAPS Take 1 capsule by mouth daily.    . diclofenac Sodium (VOLTAREN) 1 % GEL Apply topically  4 (four) times daily.    . Ferrous Sulfate (IRON) 325 (65 FE) MG TABS Take 1 tablet by mouth daily.      . furosemide (LASIX) 40 MG tablet Take 1 tablet (40 mg total) by mouth daily as needed. 90 tablet 3  . gabapentin (NEURONTIN) 100 MG capsule Take 2 capsules (200 mg total) by mouth at bedtime. 60 capsule 3  . gabapentin (NEURONTIN) 100 MG capsule Take 2 capsules (200 mg total) by mouth at bedtime. 180 capsule 0  . glucosamine-chondroitin 500-400 MG tablet Take 1 tablet by mouth 2 (two) times daily.      [name] ipratropium (ATROVENT) 0.06 % nasal spray Place 2 sprays into both nostrils 4 (four) times daily. 15 mL 12  . Menthol, Topical Analgesic, (BIOFREEZE COLORLESS) 4 % GEL Apply topically.    . Multiple Vitamin (MULTIVITAMIN) tablet Take 1 tablet by mouth daily.      . naproxen sodium (ANAPROX) 220 MG tablet Take 220 mg by mouth as needed.    . Omega-3 Fatty Acids (FISH OIL) 1000 MG CAPS Take 1 capsule by mouth daily.    . pantoprazole (PROTONIX) 20 MG tablet Take 1 tablet (20 mg total) by mouth daily. 30 minutes before food 90 tablet 3  . polyethylene glycol (MIRALAX / GLYCOLAX) 17 g packet Take 17 g by mouth daily as needed.    . potassium chloride SA (KLOR-CON M20) 20 MEQ tablet Take 1 tablet (20 mEq total) by mouth daily. 90 tablet 1  . pyridOXINE (VITAMIN B-6) 100 MG tablet Take 200 mg by mouth daily.    . rosuvastatin (CRESTOR) 40 MG tablet Take 1 tablet (40 mg total) by mouth daily. At night 90 tablet 3  . SF 5000 PLUS 1.1 % CREA dental cream   0  . SUMAtriptan (IMITREX) 20 MG/ACT nasal spray sumatriptan 20 mg/actuation nasal spray     . topiramate (TOPAMAX) 100 MG tablet Take 1 tablet (100 mg total) by mouth 2 (two) times daily. 180 tablet 3  . traMADol (ULTRAM) 50 MG tablet Take 50 mg by mouth daily.    . Turmeric 500 MG TABS Take 2 tablets by mouth daily.    . Vitamin D, Ergocalciferol, (DRISDOL) 1.25 MG (50000 UNIT) CAPS capsule Take 1 capsule (50,000 Units total) by mouth every 7 (seven) days. 4 capsule 0  . vitamin E 200 UNIT capsule Take 200 Units by mouth daily.     No current facility-administered medications for this visit.    REVIEW OF SYSTEMS:    10 Point review of Systems was done is negative except as noted above.  PHYSICAL EXAMINATION: ECOG PERFORMANCE STATUS: 1 - Symptomatic but completely ambulatory  . Vitals:   12/07/19 1125  BP: (!) 134/59  Pulse: (!) 59  Resp: 18  Temp: 98.7 F (37.1 C)  SpO2: 98%   Filed Weights   12/07/19 1125  Weight: 219 lb 9.6 oz (99.6 kg)   .Body mass index is 36.54 kg/m.  NAD GENERAL:alert, in no acute distress and comfortable SKIN: no acute rashes, no significant lesions EYES: conjunctiva are pink and non-injected, sclera anicteric OROPHARYNX: MMM, no exudates, no oropharyngeal erythema or ulceration NECK: supple, no JVD LYMPH:  no palpable lymphadenopathy in the cervical, axillary or inguinal regions LUNGS: clear to auscultation b/l with normal respiratory effort HEART: regular rate & rhythm ABDOMEN:  normoactive bowel sounds , non tender, not distended. Extremity: no pedal edema PSYCH: alert & oriented x 3 with fluent speech NEURO: no focal motor/sensory  deficits  LABORATORY DATA:  I have reviewed the data as listed  . CBC Latest Ref Rng & Units 12/07/2019 12/07/2019 11/08/2019  WBC 4.0 - 10.5 K/uL 6.3 - 3.9(L)  Hemoglobin 12.0 - 15.0 g/dL 12.7 - 12.8  Hematocrit 34.0 - 46.6 % 40.1 40.4 39.0  Platelets 150 - 400 K/uL 203 - 204.0    . CMP Latest Ref Rng & Units 12/07/2019 10/09/2019 07/05/2019  Glucose 70 - 99 mg/dL 86 80 84  BUN 8 - 23 mg/dL 26(H)  19 19  Creatinine 0.44 - 1.00 mg/dL 0.80 0.70 0.82  Sodium 135 - 145 mmol/L 139 144 146(H)  Potassium 3.5 - 5.1 mmol/L 4.1 4.2 4.1  Chloride 98 - 111 mmol/L 112(H) 109(H) 110(H)  CO2 22 - 32 mmol/L 25 23 24   Calcium 8.9 - 10.3 mg/dL 9.0 9.2 9.3  Total Protein 6.5 - 8.1 g/dL 7.5 7.0 6.9  Total Bilirubin 0.3 - 1.2 mg/dL 0.3 0.2 0.2  Alkaline Phos 38 - 126 U/L 61 73 67  AST 15 - 41 U/L 19 21 18   ALT 0 - 44 U/L 20 17 10    Component     Latest Ref Rng & Units 01/18/2019 04/27/2019 07/05/2019 11/08/2019  WBC     4.0 - 10.5 K/uL 3.7 (L) 3.4 (L) 2.8 (L) 3.9 (L)   Component     Latest Ref Rng & Units 12/07/2019  Iron     41 - 142 ug/dL 91  TIBC     236 - 444 ug/dL 255  Saturation Ratios     21 - 57 % 36  UIBC     120 - 384 ug/dL 164  Folate, Hemolysate     Not Estab. ng/mL 456.0  HCT     34.0 - 46.6 % 40.1  Folate, RBC     >498 ng/mL 1,137  Intrinsic Factor     0.0 - 1.1 AU/mL 1.1  Parietal Cell Antibody-IgG     0.0 - 20.0 Units 1.3  Ferritin     11 - 307 ng/mL 147  Vitamin B12     180 - 914 pg/mL 3,179 (H)    RADIOGRAPHIC STUDIES: I have personally reviewed the radiological images as listed and agreed with the findings in the report. No results found.  ASSESSMENT & PLAN:   74 yo with   1) Isolated 2.8k/1.4k in 07/2019. Unclear etiology ? paaising viral infection, autoimmune, medications, nutritional deficiencies PLAN: -rpt labs today show resolution of leukopenia/neutropenia. -B12 adequately replaced. -copper--pending -no evidence of pernicious anemia - parietal cell ab and IF ab neg -monitor counts with medications (esp neurontin) -no additional hematologic w/u recommended at this time with normalization of blood counts. -continue f/u with PCP   FOLLOW UP: : -Labs today -Patient and husband would like pfizer covid booster vaccine today -RTC with Dr Irene Limbo based on labs    . Orders Placed This Encounter  Procedures  . CBC with Differential/Platelet    Standing  Status:   Future    Number of Occurrences:   1    Standing Expiration Date:   12/06/2020  . CMP ([place] only)    Standing Status:   Future    Number of Occurrences:   1    Standing Expiration Date:   12/06/2020  . Vitamin B12    Standing Status:   Future    Number of Occurrences:   1    Standing Expiration Date:   12/06/2020  . Folate RBC  Standing Status:   Future    Number of Occurrences:   1    Standing Expiration Date:   12/06/2020  . Copper, serum    Standing Status:   Future    Number of Occurrences:   1    Standing Expiration Date:   12/06/2020  . Ferritin    Standing Status:   Future    Number of Occurrences:   1    Standing Expiration Date:   12/06/2020  . Iron and TIBC    Standing Status:   Future    Number of Occurrences:   1    Standing Expiration Date:   12/06/2020  . Anti-parietal antibody    Standing Status:   Future    Number of Occurrences:   1    Standing Expiration Date:   12/06/2020  . Intrinsic factor antibodies    Standing Status:   Future    Number of Occurrences:   1    Standing Expiration Date:   12/06/2020     All of the patients questions were answered with apparent satisfaction. The patient knows to call the clinic with any problems, questions or concerns.  I spent 30 mins counseling the patient face to face. The total time spent in the appointment was 35 mins and more than 50% was on counseling and direct patient cares.    Sullivan Lone MD [place] AAHIVMS [facility] Hematology/Oncology Physician [facility]  (Office):       [phone] (Work cell):  [phone] (Fax):           [phone]  12/07/2019 9:10 AM  I, Yevette Edwards, am acting as a scribe for Dr. Sullivan Lone.   .I have reviewed the above documentation for accuracy and completeness, and I agree with the above. Brunetta Genera MD

## 2019-12-07 NOTE — Progress Notes (Signed)
   Covid-19 Vaccination Clinic  Name:  Heather [name]    MRN: [id number] DOB: 06/29/1945  12/07/2019  Heather [name] was observed post Covid-19 immunization for 15 minutes without incident. She was provided with Vaccine Information Sheet and instruction to access the V-Safe system.   Heather [name] was instructed to call 911 with any severe reactions post vaccine: [name] Difficulty breathing  . Swelling of face and throat  . A fast heartbeat  . A bad rash all over body  . Dizziness and weakness

## 2019-12-08 LAB — INTRINSIC FACTOR ANTIBODIES: Intrinsic Factor: 1.1 AU/mL (ref 0.0–1.1)

## 2019-12-08 LAB — FOLATE RBC
Folate, Hemolysate: 456 ng/mL
Folate, RBC: 1137 ng/mL (ref >498)
Hematocrit: 40.1 % (ref 34.0–46.6)

## 2019-12-08 LAB — ANTI-PARIETAL ANTIBODY: Parietal Cell Antibody-IgG: 1.3 Units (ref 0.0–20.0)

## 2019-12-14 LAB — COPPER, SERUM: Copper: 187 ug/dL — ABNORMAL HIGH (ref 80–158)

## 2019-12-20 ENCOUNTER — Other Ambulatory Visit: Payer: Self-pay

## 2019-12-20 ENCOUNTER — Ambulatory Visit (INDEPENDENT_AMBULATORY_CARE_PROVIDER_SITE_OTHER): Payer: Medicare Other | Admitting: Family Medicine

## 2019-12-20 ENCOUNTER — Encounter (INDEPENDENT_AMBULATORY_CARE_PROVIDER_SITE_OTHER): Payer: Self-pay | Admitting: Family Medicine

## 2019-12-20 VITALS — BP 104/64 | HR 60 | Temp 97.5°F | Ht 65.0 in | Wt 208.0 lb

## 2019-12-20 DIAGNOSIS — E7849 Other hyperlipidemia: Secondary | ICD-10-CM | POA: Diagnosis not present

## 2019-12-20 DIAGNOSIS — E559 Vitamin D deficiency, unspecified: Secondary | ICD-10-CM

## 2019-12-20 DIAGNOSIS — Z6834 Body mass index (BMI) 34.0-34.9, adult: Secondary | ICD-10-CM | POA: Diagnosis not present

## 2019-12-20 DIAGNOSIS — E669 Obesity, unspecified: Secondary | ICD-10-CM

## 2019-12-20 MED ORDER — VITAMIN D (ERGOCALCIFEROL) 1.25 MG (50000 UNIT) PO CAPS: 50000.0000 [IU] | ORAL_CAPSULE | ORAL | 0 refills | Status: DC

## 2019-12-26 NOTE — Progress Notes (Signed)
Chief Complaint:   OBESITY Heather [name] is here to discuss her progress with her obesity treatment plan along with follow-up of her obesity related diagnoses. Heather [name] is on the Category 1 Plan and states she is following her eating plan approximately 100% of the time. Heather [name] states she is walking and on the elliptical for 30-60 minutes 2 times per week.  Today's visit was #: 44 Starting weight: 268 lbs Starting date: 12/23/2016 Today's weight: 208 lbs Today's date: 12/20/2019 Total lbs lost to date: 60 Total lbs lost since last in-office visit: 14  Interim History: Heather [name] has done well with weight loss over the last 6 weeks. She notes improved support at home and she has been feeling better, and has been able to walk better.  Subjective:   1. Other hyperlipidemia Heather [name] is working on diet and weight loss, and she is stable on Crestor. She denies chest pain.  2. Vitamin D deficiency Heather [name] is stable on Vit D, and she denies nausea or vomiting.  Assessment/Plan:   1. Other hyperlipidemia Cardiovascular risk and specific lipid/LDL goals reviewed. We discussed several lifestyle modifications today. Heather [name] will continue Crestor, and will continue to work on diet, exercise and weight loss efforts. We will recheck labs in 1 month. Orders and follow up as documented in patient record.   2. Vitamin D deficiency Low Vitamin D level contributes to fatigue and are associated with obesity, breast, and colon cancer. We will refill prescription Vitamin D for 1 month, and we will recheck labs in 1 month. Heather [name] will follow-up for routine testing of Vitamin D, at least 2-3 times per year to avoid over-replacement.  - Vitamin D, Ergocalciferol, (DRISDOL) 1.25 MG (50000 UNIT) CAPS capsule; Take 1 capsule (50,000 Units total) by mouth every 7 (seven) days.  Dispense: 4 capsule; Refill: 0  3. Class 1 obesity with serious comorbidity and body mass index (BMI) of 34.0 to 34.9 in adult, unspecified obesity  type Heather [name] is currently in the action stage of change. As such, her goal is to continue with weight loss efforts. She has agreed to the Category 1 Plan or keeping a food journal and adhering to recommended goals of 1200 calories and 75+ grams of protein daily.   Exercise goals: As is.  Behavioral modification strategies: increasing lean protein intake, meal planning and cooking strategies and dealing with family or coworker sabotage.  Heather [name] has agreed to follow-up with our clinic in 3 weeks. She was informed of the importance of frequent follow-up visits to maximize her success with intensive lifestyle modifications for her multiple health conditions.   Objective:   Blood pressure 104/64, pulse 60, temperature (!) 97.5 F (36.4 C), height 5' 5"  (1.651 m), weight 208 lb (94.3 kg), SpO2 100 %. Body mass index is 34.61 kg/m.  General: Cooperative, alert, well developed, in no acute distress. HEENT: Conjunctivae and lids unremarkable. Cardiovascular: Regular rhythm.  Lungs: Normal work of breathing. Neurologic: No focal deficits.   Lab Results  Component Value Date   CREATININE 0.80 12/07/2019   BUN 26 (H) 12/07/2019   NA 139 12/07/2019   K 4.1 12/07/2019   CL 112 (H) 12/07/2019   CO2 25 12/07/2019   Lab Results  Component Value Date   ALT 20 12/07/2019   AST 19 12/07/2019   ALKPHOS 61 12/07/2019   BILITOT 0.3 12/07/2019   Lab Results  Component Value Date   HGBA1C 5.6 10/09/2019   HGBA1C 5.8 (H) 07/05/2019   HGBA1C 5.6 12/05/2018  HGBA1C 5.6 04/27/2018   HGBA1C 6.0 (H) 12/01/2017   Lab Results  Component Value Date   INSULIN 4.3 10/09/2019   INSULIN 8.6 07/05/2019   INSULIN 6.4 12/05/2018   INSULIN 3.6 04/27/2018   INSULIN 5.6 12/01/2017   Lab Results  Component Value Date   TSH 0.93 04/27/2019   Lab Results  Component Value Date   CHOL 186 07/05/2019   HDL 74 07/05/2019   LDLCALC 100 (H) 07/05/2019   TRIG 64 07/05/2019   CHOLHDL 3 03/05/2016   Lab  Results  Component Value Date   WBC 6.3 12/07/2019   HGB 12.7 12/07/2019   HCT 40.1 12/07/2019   MCV 97.6 12/07/2019   PLT 203 12/07/2019   Lab Results  Component Value Date   IRON 91 12/07/2019   TIBC 255 12/07/2019   FERRITIN 147 12/07/2019    Obesity Behavioral Intervention:   Approximately 15 minutes were spent on the discussion below.  ASK: We discussed the diagnosis of obesity with Heather [name] today and Heather [name] agreed to give Heather [name] permission to discuss obesity behavioral modification therapy today.  ASSESS: Heather [name] has the diagnosis of obesity and her BMI today is 34.61. Heather [name] is in the action stage of change.   ADVISE: Heather [name] was educated on the multiple health risks of obesity as well as the benefit of weight loss to improve her health. She was advised of the need for long term treatment and the importance of lifestyle modifications to improve her current health and to decrease her risk of future health problems.  AGREE: Multiple dietary modification options and treatment options were discussed and Heather [name] agreed to follow the recommendations documented in the above note.  ARRANGE: Heather [name] was educated on the importance of frequent visits to treat obesity as outlined per CMS and USPSTF guidelines and agreed to schedule her next follow up appointment today.  Attestation Statements:   Reviewed by clinician on day of visit: allergies, medications, problem list, medical history, surgical history, family history, social history, and previous encounter notes.   I, Heather [name], am acting as transcriptionist for Heather Nip, MD.  I have reviewed the above documentation for accuracy and completeness, and I agree with the above. -  Heather Nip, MD

## 2020-01-07 NOTE — Progress Notes (Signed)
PATIENT: Heather [name] DOB: [birth year]  REASON FOR VISIT: follow up HISTORY FROM: patient  HISTORY OF PRESENT ILLNESS: Today 01/07/20:  Heather [name] is a 74 year old female with a history of obstructive sleep apnea on BiPAP.  Her download indicates that she uses her machine 27 out of 30 days for compliance of 90%.  She uses her machine greater than 4 hours each night.  On average she uses her machine 6 hours.  Her residual AHI is 0.4 on 12/8 centimeters of water.  Leak in the 95th percentile is 31.6 L/min.  She reports that the CPAP is working well for her.  She returns today for an evaluation.  HISTORY 01/05/19:  Heather [name] is a 74 year old female with a history of obstructive sleep apnea on Bipap.  Her download indicates that she used her machine 29 out of 30 days for compliance of 97%.  She use her machine greater than 4 hours 27 days for compliance of 90%.  On average she uses her machine 5 hours and 27 minutes.  Her residual AHI is 0.2 on 12/8 cmH2O.  She does have a significant leak in the 95th percentile at 43.2 L/min.  She states that she does not feel the mask leaking at night.  She returns today for an evaluation.  REVIEW OF SYSTEMS: Out of a complete 14 system review of symptoms, the patient complains only of the following symptoms, and all other reviewed systems are negative.  FSS ESS  ALLERGIES: Allergies  Allergen Reactions  . Aspirin   . Coconut Oil   . Lisinopril     REACTION: cough  . Metoprolol Itching  . Peanut-Containing Drug Products   . Penicillins     REACTION: rash  . Prednisone   . Strawberry Extract     HOME MEDICATIONS: Outpatient Medications Prior to Visit  Medication Sig Dispense Refill  . acetaminophen (TYLENOL) 500 MG tablet Take 650 mg by mouth 3 (three) times daily as needed for pain.     . Calcium Carbonate-Vitamin D (CALCIUM 600 + D PO) Take 2 tablets by mouth daily.      . cetirizine (ZYRTEC) 10 MG tablet Take 10 mg by mouth  daily.    . clopidogrel (PLAVIX) 75 MG tablet TAKE 1 TABLET DAILY 90 tablet 3  . Cyanocobalamin 1000 MCG CAPS Take 1 capsule by mouth daily.    . diclofenac Sodium (VOLTAREN) 1 % GEL Apply topically 4 (four) times daily.    . Ferrous Sulfate (IRON) 325 (65 FE) MG TABS Take 1 tablet by mouth daily.      . furosemide (LASIX) 40 MG tablet Take 1 tablet (40 mg total) by mouth daily as needed. 90 tablet 3  . gabapentin (NEURONTIN) 100 MG capsule Take 2 capsules (200 mg total) by mouth at bedtime. 180 capsule 0  . glucosamine-chondroitin 500-400 MG tablet Take 1 tablet by mouth 2 (two) times daily.      [name] ipratropium (ATROVENT) 0.06 % nasal spray Place 2 sprays into both nostrils 4 (four) times daily. 15 mL 12  . Menthol, Topical Analgesic, (BIOFREEZE COLORLESS) 4 % GEL Apply topically.    . Multiple Vitamin (MULTIVITAMIN) tablet Take 1 tablet by mouth daily.      . naproxen sodium (ANAPROX) 220 MG tablet Take 220 mg by mouth as needed.    . Omega-3 Fatty Acids (FISH OIL) 1000 MG CAPS Take 1 capsule by mouth daily.    . pantoprazole (PROTONIX) 20 MG tablet Take 1 tablet (  20 mg total) by mouth daily. 30 minutes before food 90 tablet 3  . polyethylene glycol (MIRALAX / GLYCOLAX) 17 g packet Take 17 g by mouth daily as needed.    . potassium chloride SA (KLOR-CON M20) 20 MEQ tablet Take 1 tablet (20 mEq total) by mouth daily. 90 tablet 1  . pyridOXINE (VITAMIN B-6) 100 MG tablet Take 200 mg by mouth daily.    . rosuvastatin (CRESTOR) 40 MG tablet Take 1 tablet (40 mg total) by mouth daily. At night 90 tablet 3  . SF 5000 PLUS 1.1 % CREA dental cream   0  . SUMAtriptan (IMITREX) 20 MG/ACT nasal spray sumatriptan 20 mg/actuation nasal spray    . topiramate (TOPAMAX) 100 MG tablet Take 1 tablet (100 mg total) by mouth 2 (two) times daily. 180 tablet 3  . traMADol (ULTRAM) 50 MG tablet Take 50 mg by mouth daily.    . Turmeric 500 MG TABS Take 2 tablets by mouth daily.    . Vitamin D, Ergocalciferol,  (DRISDOL) 1.25 MG (50000 UNIT) CAPS capsule Take 1 capsule (50,000 Units total) by mouth every 7 (seven) days. 4 capsule 0  . vitamin E 200 UNIT capsule Take 200 Units by mouth daily.     No facility-administered medications prior to visit.    PAST MEDICAL HISTORY: Past Medical History:  Diagnosis Date  . Abdominal hernia   . Abnormal Pap smear   . ALLERGIC RHINITIS 10/22/2006  . Allergy   . ANEMIA 12/18/2008  . Arthritis    scoliosis moderate deg changes lumbar Xray 11/04/17   . ASYMPTOMATIC POSTMENOPAUSAL STATUS 11/22/2007  . Back pain   . CAD (coronary artery disease)    in LAD  . Complication of anesthesia   . Degenerative arthritis   . DIABETES MELLITUS, TYPE II 01/13/2007  . Diverticulosis   . Dyslipidemia   . Fibroid   . Frequent headaches   . GERD 07/20/2007  . GOITER, MULTINODULAR 07/20/2007  . Headache(784.0) 07/20/2007  . HEARING LOSS 11/22/2007  . Hiatal hernia   . Hiatal hernia    large  . History of chicken pox   . Hx of colposcopy with cervical biopsy   . HYPERCHOLESTEROLEMIA 01/13/2007  . Hyperglycemia   . HYPERTENSION 10/22/2006  . Lung nodule    unchanged since 05/26/13   . Migraines   . NASH (nonalcoholic steatohepatitis)   . Nocturnal hypoxemia 02/17/2013  . Obesity   . Obstructive sleep apnea   . OSA on CPAP    per neurology  . OSTEOARTHRITIS 10/22/2006  . Other chronic nonalcoholic liver disease 2/54/9826  . Sedimentation rate elevation   . Sleep apnea    on cpap  . Urine incontinence   . UTI (urinary tract infection)     PAST SURGICAL HISTORY: Past Surgical History:  Procedure Laterality Date  . BREAST SURGERY  1984   Breast reduction b/l   . CATARACT EXTRACTION, BILATERAL    . DEXA  08/2005  . DILATION AND CURETTAGE OF UTERUS    . ELECTROCARDIOGRAM  10/15/2006  . ESOPHAGOGASTRODUODENOSCOPY  12/08/2005  . FOOT SURGERY     hammertoe and bunion 05/2017   . Stress Cardiolite  10/21/2005  . sweat gland removal    . WISDOM TOOTH EXTRACTION        FAMILY HISTORY: Family History  Problem Relation Age of Onset  . Asthma Mother   . Depression Mother   . Bipolar disorder Mother   . Dementia Mother   .  Arthritis Mother   . Breast cancer Mother        primary  . Colon cancer Mother        mets from breast  . Cancer Mother        colon  . Hypertension Father   . Migraines Father   . Cancer Maternal Aunt        ?  [name] Heart disease Maternal Grandmother   . Cancer Maternal Grandfather        stomach ?    SOCIAL HISTORY: Social History   Socioeconomic History  . Marital status: Married    Spouse name: [name]  . Number of children: 2  . Years of education: College  . Highest education level: Not on file  Occupational History  . Occupation: Retired  Tobacco Use  . Smoking status: Former Smoker    Packs/day: 2.00    Years: 20.00    Pack years: 40.00    Types: Cigarettes    Quit date: 03/03/1979    Years since quitting: 40.8  . Smokeless tobacco: Never Used  Substance and Sexual Activity  . Alcohol use: Yes    Comment: rare  . Drug use: No  . Sexual activity: Yes    Birth control/protection: Surgical  Other Topics Concern  . Not on file  Social History Narrative   Patient is married [name]).   Patient has two children; 3 pregnancies 2 live births    Patient is retired Designer, jewellery, Chiropodist    Patient has a Financial risk analyst.   Patient is right-handed.   Patient lives at home with family.   Caffeine Use: 2 soda every other day   Former smoker 20+ years ago 2 ppd quit in 44s   No guns, wears seat belt, safe in relationship    Social Determinants of Health   Financial Resource Strain: Low Risk   . Difficulty of Paying Living Expenses: Not hard at all  Food Insecurity: No Food Insecurity  . Worried About Charity fundraiser in the Last Year: Never true  . Ran Out of Food in the Last Year: Never true  Transportation Needs: No Transportation Needs  . Lack of  Transportation (Medical): No  . Lack of Transportation (Non-Medical): No  Physical Activity:   . Days of Exercise per Week: Not on file  . Minutes of Exercise per Session: Not on file  Stress: No Stress Concern Present  . Feeling of Stress : Not at all  Social Connections: Unknown  . Frequency of Communication with Friends and Family: Not on file  . Frequency of Social Gatherings with Friends and Family: Not on file  . Attends Religious Services: Not on file  . Active Member of Clubs or Organizations: Not on file  . Attends Archivist Meetings: Not on file  . Marital Status: Married  Human resources officer Violence: Not At Risk  . Fear of Current or Ex-Partner: No  . Emotionally Abused: No  . Physically Abused: No  . Sexually Abused: No      PHYSICAL EXAM  Vitals:   01/08/20 1255  BP: 140/70  Weight: 202 lb 12.8 oz (92 kg)  Height: 5' 5"  (1.651 m)   Body mass index is 33.75 kg/m.  Generalized: Well developed, in no acute distress  Chest: Lungs clear to auscultation bilaterally  Neurological examination  Mentation: Alert oriented to time, place, history taking. Follows all commands speech and language fluent Cranial nerve II-XII: Extraocular movements  were full, visual field were full on confrontational test Head turning and shoulder shrug  were normal and symmetric. Motor: The motor testing reveals 5 over 5 strength of all 4 extremities. Good symmetric motor tone is noted throughout.  Sensory: Sensory testing is intact to soft touch on all 4 extremities. No evidence of extinction is noted.  Gait and station: Gait is normal.    DIAGNOSTIC DATA (LABS, IMAGING, TESTING) - I reviewed patient records, labs, notes, testing and imaging myself where available.  Lab Results  Component Value Date   WBC 6.3 12/07/2019   HGB 12.7 12/07/2019   HCT 40.1 12/07/2019   MCV 97.6 12/07/2019   PLT 203 12/07/2019      Component Value Date/Time   NA 139 12/07/2019 1246   NA  144 10/09/2019 1425   K 4.1 12/07/2019 1246   CL 112 (H) 12/07/2019 1246   CO2 25 12/07/2019 1246   GLUCOSE 86 12/07/2019 1246   BUN 26 (H) 12/07/2019 1246   BUN 19 10/09/2019 1425   CREATININE 0.80 12/07/2019 1246   CREATININE 0.81 12/28/2011 0853   CALCIUM 9.0 12/07/2019 1246   PROT 7.5 12/07/2019 1246   PROT 7.0 10/09/2019 1425   ALBUMIN 3.6 12/07/2019 1246   ALBUMIN 4.3 10/09/2019 1425   AST 19 12/07/2019 1246   ALT 20 12/07/2019 1246   ALKPHOS 61 12/07/2019 1246   BILITOT 0.3 12/07/2019 1246   GFRNONAA >60 12/07/2019 1246   GFRAA 100 10/09/2019 1425   Lab Results  Component Value Date   CHOL 186 07/05/2019   HDL 74 07/05/2019   LDLCALC 100 (H) 07/05/2019   TRIG 64 07/05/2019   CHOLHDL 3 03/05/2016   Lab Results  Component Value Date   HGBA1C 5.6 10/09/2019   Lab Results  Component Value Date   VITAMINB12 3,179 (H) 12/07/2019   Lab Results  Component Value Date   TSH 0.93 04/27/2019      ASSESSMENT AND PLAN 74 y.o. year old female  has a past medical history of Abdominal hernia, Abnormal Pap smear, ALLERGIC RHINITIS (10/22/2006), Allergy, ANEMIA (12/18/2008), Arthritis, ASYMPTOMATIC POSTMENOPAUSAL STATUS (11/22/2007), Back pain, CAD (coronary artery disease), Complication of anesthesia, Degenerative arthritis, DIABETES MELLITUS, TYPE II (01/13/2007), Diverticulosis, Dyslipidemia, Fibroid, Frequent headaches, GERD (07/20/2007), GOITER, MULTINODULAR (07/20/2007), Headache(784.0) (07/20/2007), HEARING LOSS (11/22/2007), Hiatal hernia, Hiatal hernia, History of chicken pox, colposcopy with cervical biopsy, HYPERCHOLESTEROLEMIA (01/13/2007), Hyperglycemia, HYPERTENSION (10/22/2006), Lung nodule, Migraines, NASH (nonalcoholic steatohepatitis), Nocturnal hypoxemia (02/17/2013), Obesity, Obstructive sleep apnea, OSA on CPAP, OSTEOARTHRITIS (10/22/2006), Other chronic nonalcoholic liver disease (11/22/3005), Sedimentation rate elevation, Sleep apnea, Urine incontinence, and UTI  (urinary tract infection). here with:  1. OSA on CPAP  - CPAP compliance excellent - Good treatment of AHI  - Encourage patient to use CPAP nightly and > 4 hours each night - F/U in 1 year or sooner if needed   I spent 20 minutes of face-to-face and non-face-to-face time with patient.  This included previsit chart review, lab review, study review, order entry, electronic health record documentation, patient education.  Ward Givens, MSN, NP-C 01/07/2020, 4:57 PM [facility] [address], [place], [place] [address] [phone]

## 2020-01-08 ENCOUNTER — Ambulatory Visit (INDEPENDENT_AMBULATORY_CARE_PROVIDER_SITE_OTHER): Payer: Medicare Other | Admitting: Adult Health

## 2020-01-08 ENCOUNTER — Other Ambulatory Visit: Payer: Self-pay

## 2020-01-08 ENCOUNTER — Encounter: Payer: Self-pay | Admitting: Adult Health

## 2020-01-08 VITALS — BP 140/70 | Ht 65.0 in | Wt 202.8 lb

## 2020-01-08 DIAGNOSIS — I2510 Atherosclerotic heart disease of native coronary artery without angina pectoris: Secondary | ICD-10-CM

## 2020-01-08 DIAGNOSIS — G4733 Obstructive sleep apnea (adult) (pediatric): Secondary | ICD-10-CM

## 2020-01-08 DIAGNOSIS — Z9989 Dependence on other enabling machines and devices: Secondary | ICD-10-CM

## 2020-01-08 NOTE — Patient Instructions (Signed)
Continue using CPAP nightly and greater than 4 hours each night Continue topamax If your symptoms worsen or you develop new symptoms please let us know.

## 2020-01-10 ENCOUNTER — Other Ambulatory Visit: Payer: Self-pay

## 2020-01-10 ENCOUNTER — Ambulatory Visit (INDEPENDENT_AMBULATORY_CARE_PROVIDER_SITE_OTHER): Payer: Medicare Other | Admitting: Family Medicine

## 2020-01-10 ENCOUNTER — Encounter: Payer: Self-pay | Admitting: Family Medicine

## 2020-01-10 ENCOUNTER — Encounter (INDEPENDENT_AMBULATORY_CARE_PROVIDER_SITE_OTHER): Payer: Self-pay | Admitting: Family Medicine

## 2020-01-10 VITALS — BP 105/67 | HR 68 | Temp 98.3°F | Ht 65.0 in | Wt 209.0 lb

## 2020-01-10 DIAGNOSIS — E669 Obesity, unspecified: Secondary | ICD-10-CM | POA: Diagnosis not present

## 2020-01-10 DIAGNOSIS — R600 Localized edema: Secondary | ICD-10-CM

## 2020-01-10 DIAGNOSIS — E559 Vitamin D deficiency, unspecified: Secondary | ICD-10-CM | POA: Diagnosis not present

## 2020-01-10 DIAGNOSIS — Z6834 Body mass index (BMI) 34.0-34.9, adult: Secondary | ICD-10-CM

## 2020-01-10 MED ORDER — VITAMIN D (ERGOCALCIFEROL) 1.25 MG (50000 UNIT) PO CAPS: 50000.0000 [IU] | ORAL_CAPSULE | ORAL | 0 refills | Status: DC

## 2020-01-11 DIAGNOSIS — R928 Other abnormal and inconclusive findings on diagnostic imaging of breast: Secondary | ICD-10-CM | POA: Diagnosis not present

## 2020-01-11 DIAGNOSIS — R921 Mammographic calcification found on diagnostic imaging of breast: Secondary | ICD-10-CM | POA: Diagnosis not present

## 2020-01-11 NOTE — Progress Notes (Signed)
Chief Complaint:   OBESITY Heather [name] is here to discuss her progress with her obesity treatment plan along with follow-up of her obesity related diagnoses. Heather [name] is on keeping a food journal and adhering to recommended goals of 1200 calories and 75+ grams of protein daily and states she is following her eating plan approximately 100% of the time. Heather [name] states she is on the elliptical and walking for 30 minutes 4 times per week.  Today's visit was #: 41 Starting weight: 268 lbs Starting date: 12/23/2016 Today's weight: 209 lbs Today's date: 01/10/2020 Total lbs lost to date: 1 Total lbs lost since last in-office visit: 0  Interim History: Heather [name] continues to work on her diet and weight loss. She is retaining some fluid however, and has had increased GI upset with broccoli and beans.  Subjective:   1. Bilateral lower extremity edema Heather [name] notes increased swelling in her legs bilateral, worse in the last 2 days. She has taken her Lasix most days and she is urinating more which has worsened her overactive bladder. She is trying to decrease her sodium.  2. Vitamin D deficiency Heather [name]'s last Vit D level was not yet at goal. She denies nausea or vomiting.  Assessment/Plan:   1. Bilateral lower extremity edema Heather [name] was educated on high sodium foods and encouraged her to discuss this further with her primary care physician.  2. Vitamin D deficiency Low Vitamin D level contributes to fatigue and are associated with obesity, breast, and colon cancer. We will refill prescription Vitamin D for 1 month, and we will recheck labs in 1 month. Heather [name] will follow-up for routine testing of Vitamin D, at least 2-3 times per year to avoid over-replacement.  - Vitamin D, Ergocalciferol, (DRISDOL) 1.25 MG (50000 UNIT) CAPS capsule; Take 1 capsule (50,000 Units total) by mouth every 7 (seven) days.  Dispense: 4 capsule; Refill: 0  3. Class 1 obesity with serious comorbidity and body mass index (BMI) of  34.0 to 34.9 in adult, unspecified obesity type Heather [name] is currently in the action stage of change. As such, her goal is to continue with weight loss efforts. She has agreed to keeping a food journal and adhering to recommended goals of 1200 calories and 75+ grams of protein daily.   Heather [name] is to decrease the volume of high fiber foods for now and discuss this with her primary care physician. She will slowly increase the volume and get her GI track used to a higher fiber diet.  Exercise goals: As is.  Behavioral modification strategies: increasing water intake and holiday eating strategies .  Heather [name] has agreed to follow-up with our clinic in 3 to 4 weeks. She was informed of the importance of frequent follow-up visits to maximize her success with intensive lifestyle modifications for her multiple health conditions.   Objective:   Blood pressure 105/67, pulse 68, temperature 98.3 F (36.8 C), height 5' 5"  (1.651 m), weight 209 lb (94.8 kg), SpO2 95 %. Body mass index is 34.78 kg/m.  1+ pitting edema bilateral with left worse than right.  General: Cooperative, alert, well developed, in no acute distress. HEENT: Conjunctivae and lids unremarkable. Cardiovascular: Regular rhythm.  Lungs: Normal work of breathing. Neurologic: No focal deficits.   Lab Results  Component Value Date   CREATININE 0.80 12/07/2019   BUN 26 (H) 12/07/2019   NA 139 12/07/2019   K 4.1 12/07/2019   CL 112 (H) 12/07/2019   CO2 25 12/07/2019   Lab Results  Component Value  Date   ALT 20 12/07/2019   AST 19 12/07/2019   ALKPHOS 61 12/07/2019   BILITOT 0.3 12/07/2019   Lab Results  Component Value Date   HGBA1C 5.6 10/09/2019   HGBA1C 5.8 (H) 07/05/2019   HGBA1C 5.6 12/05/2018   HGBA1C 5.6 04/27/2018   HGBA1C 6.0 (H) 12/01/2017   Lab Results  Component Value Date   INSULIN 4.3 10/09/2019   INSULIN 8.6 07/05/2019   INSULIN 6.4 12/05/2018   INSULIN 3.6 04/27/2018   INSULIN 5.6 12/01/2017   Lab Results    Component Value Date   TSH 0.93 04/27/2019   Lab Results  Component Value Date   CHOL 186 07/05/2019   HDL 74 07/05/2019   LDLCALC 100 (H) 07/05/2019   TRIG 64 07/05/2019   CHOLHDL 3 03/05/2016   Lab Results  Component Value Date   WBC 6.3 12/07/2019   HGB 12.7 12/07/2019   HCT 40.1 12/07/2019   MCV 97.6 12/07/2019   PLT 203 12/07/2019   Lab Results  Component Value Date   IRON 91 12/07/2019   TIBC 255 12/07/2019   FERRITIN 147 12/07/2019    Obesity Behavioral Intervention:   Approximately 15 minutes were spent on the discussion below.  ASK: We discussed the diagnosis of obesity with Heather [name] today and Heather [name] agreed to give [name] permission to discuss obesity behavioral modification therapy today.  ASSESS: Terrea has the diagnosis of obesity and her BMI today is 34.78. Alisah is in the action stage of change.   ADVISE: Heather [name] was educated on the multiple health risks of obesity as well as the benefit of weight loss to improve her health. She was advised of the need for long term treatment and the importance of lifestyle modifications to improve her current health and to decrease her risk of future health problems.  AGREE: Multiple dietary modification options and treatment options were discussed and Heather [name] agreed to follow the recommendations documented in the above note.  ARRANGE: Heather [name] was educated on the importance of frequent visits to treat obesity as outlined per CMS and USPSTF guidelines and agreed to schedule her next follow up appointment today.  Attestation Statements:   Reviewed by clinician on day of visit: allergies, medications, problem list, medical history, surgical history, family history, social history, and previous encounter notes.   I, [name], am acting as transcriptionist for [name], MD.  I have reviewed the above documentation for accuracy and completeness, and I agree with the above. -  [name], MD

## 2020-01-27 ENCOUNTER — Other Ambulatory Visit (HOSPITAL_COMMUNITY): Payer: Self-pay | Admitting: Cardiology

## 2020-01-29 ENCOUNTER — Other Ambulatory Visit: Payer: Self-pay

## 2020-01-29 DIAGNOSIS — R609 Edema, unspecified: Secondary | ICD-10-CM

## 2020-01-29 DIAGNOSIS — E119 Type 2 diabetes mellitus without complications: Secondary | ICD-10-CM

## 2020-01-29 MED ORDER — FUROSEMIDE 40 MG PO TABS: 40.0000 mg | ORAL_TABLET | Freq: Every day | ORAL | 1 refills | Status: DC | PRN

## 2020-01-29 MED ORDER — ROSUVASTATIN CALCIUM 40 MG PO TABS: 40.0000 mg | ORAL_TABLET | Freq: Every day | ORAL | 1 refills | Status: DC

## 2020-01-31 ENCOUNTER — Ambulatory Visit (INDEPENDENT_AMBULATORY_CARE_PROVIDER_SITE_OTHER): Payer: Medicare Other | Admitting: Family Medicine

## 2020-01-31 ENCOUNTER — Other Ambulatory Visit: Payer: Self-pay

## 2020-01-31 ENCOUNTER — Encounter (INDEPENDENT_AMBULATORY_CARE_PROVIDER_SITE_OTHER): Payer: Self-pay | Admitting: Family Medicine

## 2020-01-31 VITALS — BP 117/69 | HR 59 | Temp 97.8°F | Ht 65.0 in | Wt 209.0 lb

## 2020-01-31 DIAGNOSIS — E559 Vitamin D deficiency, unspecified: Secondary | ICD-10-CM | POA: Diagnosis not present

## 2020-01-31 DIAGNOSIS — Z6834 Body mass index (BMI) 34.0-34.9, adult: Secondary | ICD-10-CM

## 2020-01-31 DIAGNOSIS — E669 Obesity, unspecified: Secondary | ICD-10-CM

## 2020-02-01 MED ORDER — VITAMIN D (ERGOCALCIFEROL) 1.25 MG (50000 UNIT) PO CAPS: 50000.0000 [IU] | ORAL_CAPSULE | ORAL | 0 refills | Status: DC

## 2020-02-06 NOTE — Progress Notes (Signed)
Chief Complaint:   OBESITY Tyriana is here to discuss her progress with her obesity treatment plan along with follow-up of her obesity related diagnoses. Modena is on keeping a food journal and adhering to recommended goals of 1200 calories and 75+ grams of protein daily and states she is following her eating plan approximately 75% of the time. Maritsa states she is on the elliptical for 45 minutes 1-2 times per week.  Today's visit was #: 20 Starting weight: 268 lbs Starting date: 12/23/2016 Today's weight: 209 lbs Today's date: 01/31/2020 Total lbs lost to date: 84 Total lbs lost since last in-office visit: 0  Interim History: Atonya has done very well maintaining her weight over multiple celebrations including Thanksgiving. She feels her healthy eating is now a habit and when she goes a little off track it isn't too difficult to get back on track.  Subjective:   1. Vitamin D deficiency Dorleen continues to do well on Vit D prescription. She is not at risk of over-replacement.  Assessment/Plan:   1. Vitamin D deficiency Low Vitamin D level contributes to fatigue and are associated with obesity, breast, and colon cancer. We will refill prescription Vitamin D for 1 month, and we will recheck labs in 1-2 months. Kamala will follow-up for routine testing of Vitamin D, at least 2-3 times per year to avoid over-replacement.  - Vitamin D, Ergocalciferol, (DRISDOL) 1.25 MG (50000 UNIT) CAPS capsule; Take 1 capsule (50,000 Units total) by mouth every 7 (seven) days.  Dispense: 4 capsule; Refill: 0  2. Class 1 obesity with serious comorbidity and body mass index (BMI) of 34.0 to 34.9 in adult, unspecified obesity type Lexy is currently in the action stage of change. As such, her goal is to continue with weight loss efforts. She has agreed to the Category 2 Plan.   Exercise goals: As is.  Behavioral modification strategies: increasing lean protein intake and meal planning and cooking  strategies.  Earl has agreed to follow-up with our clinic in 3 weeks. She was informed of the importance of frequent follow-up visits to maximize her success with intensive lifestyle modifications for her multiple health conditions.   Objective:   Blood pressure 117/69, pulse (!) 59, temperature 97.8 F (36.6 C), height 5' 5"  (1.651 m), weight 209 lb (94.8 kg), SpO2 98 %. Body mass index is 34.78 kg/m.  General: Cooperative, alert, well developed, in no acute distress. HEENT: Conjunctivae and lids unremarkable. Cardiovascular: Regular rhythm.  Lungs: Normal work of breathing. Neurologic: No focal deficits.   Lab Results  Component Value Date   CREATININE 0.80 12/07/2019   BUN 26 (H) 12/07/2019   NA 139 12/07/2019   K 4.1 12/07/2019   CL 112 (H) 12/07/2019   CO2 25 12/07/2019   Lab Results  Component Value Date   ALT 20 12/07/2019   AST 19 12/07/2019   ALKPHOS 61 12/07/2019   BILITOT 0.3 12/07/2019   Lab Results  Component Value Date   HGBA1C 5.6 10/09/2019   HGBA1C 5.8 (H) 07/05/2019   HGBA1C 5.6 12/05/2018   HGBA1C 5.6 04/27/2018   HGBA1C 6.0 (H) 12/01/2017   Lab Results  Component Value Date   INSULIN 4.3 10/09/2019   INSULIN 8.6 07/05/2019   INSULIN 6.4 12/05/2018   INSULIN 3.6 04/27/2018   INSULIN 5.6 12/01/2017   Lab Results  Component Value Date   TSH 0.93 04/27/2019   Lab Results  Component Value Date   CHOL 186 07/05/2019   HDL 74  07/05/2019   LDLCALC 100 (H) 07/05/2019   TRIG 64 07/05/2019   CHOLHDL 3 03/05/2016   Lab Results  Component Value Date   WBC 6.3 12/07/2019   HGB 12.7 12/07/2019   HCT 40.1 12/07/2019   MCV 97.6 12/07/2019   PLT 203 12/07/2019   Lab Results  Component Value Date   IRON 91 12/07/2019   TIBC 255 12/07/2019   FERRITIN 147 12/07/2019    Obesity Behavioral Intervention:   Approximately 15 minutes were spent on the discussion below.  ASK: We discussed the diagnosis of obesity with Eliabeth today and Ileanna  agreed to give [name] permission to discuss obesity behavioral modification therapy today.  ASSESS: Jamyla has the diagnosis of obesity and her BMI today is 34.78. Zyion is in the action stage of change.   ADVISE: Tyne was educated on the multiple health risks of obesity as well as the benefit of weight loss to improve her health. She was advised of the need for long term treatment and the importance of lifestyle modifications to improve her current health and to decrease her risk of future health problems.  AGREE: Multiple dietary modification options and treatment options were discussed and Kailee agreed to follow the recommendations documented in the above note.  ARRANGE: Vora was educated on the importance of frequent visits to treat obesity as outlined per CMS and USPSTF guidelines and agreed to schedule her next follow up appointment today.  Attestation Statements:   Reviewed by clinician on day of visit: allergies, medications, problem list, medical history, surgical history, family history, social history, and previous encounter notes.   I, [name], am acting as transcriptionist for [name], MD.  I have reviewed the above documentation for accuracy and completeness, and I agree with the above. -  [name], MD

## 2020-02-07 ENCOUNTER — Other Ambulatory Visit: Payer: Self-pay

## 2020-02-07 ENCOUNTER — Encounter: Payer: Self-pay | Admitting: Family Medicine

## 2020-02-07 ENCOUNTER — Ambulatory Visit (INDEPENDENT_AMBULATORY_CARE_PROVIDER_SITE_OTHER): Payer: Medicare Other | Admitting: Family Medicine

## 2020-02-07 VITALS — BP 118/62 | HR 70 | Ht 65.0 in | Wt 210.0 lb

## 2020-02-07 DIAGNOSIS — I2510 Atherosclerotic heart disease of native coronary artery without angina pectoris: Secondary | ICD-10-CM | POA: Diagnosis not present

## 2020-02-07 DIAGNOSIS — M170 Bilateral primary osteoarthritis of knee: Secondary | ICD-10-CM

## 2020-02-07 DIAGNOSIS — G6281 Critical illness polyneuropathy: Secondary | ICD-10-CM | POA: Diagnosis not present

## 2020-02-07 MED ORDER — GABAPENTIN 100 MG PO CAPS: ORAL_CAPSULE | ORAL | 3 refills | Status: DC

## 2020-02-07 NOTE — Assessment & Plan Note (Signed)
Mild worsening increase gabapentin to 300 mg at night.  Continue the 100 mg tablet so so patient can titrate appropriately

## 2020-02-07 NOTE — Patient Instructions (Addendum)
Good to see you  Injections given today Referral placed to Dr. Mayer Camel  See me again in 4- weeks

## 2020-02-07 NOTE — Progress Notes (Signed)
[name] [place] Phone: [phone] Subjective:   Heather [name], am serving as a scribe for Dr. Hulan Saas. This visit occurred during the SARS-CoV-2 public health emergency.  Safety protocols were in place, including screening questions prior to the visit, additional usage of staff PPE, and extensive cleaning of exam room while observing appropriate contact time as indicated for disinfecting solutions.   I'm seeing this patient by the request  of:  McLean-Scocuzza, Heather Glow, MD  CC: Bilateral knee pain follow-up  [id number]  Heather [name] is a 74 y.o. female coming in with complaint of bilateral knee pain   Last seen 12/05/2019 Bilateral steroid injections given again today.  Patient has a chronic problem with exacerbation.  Secondary to patient's other comorbidities and patient not wanting any surgical intervention this is the best we have her.  We discussed with patient about icing regimen and home exercise, encourage weight loss which patient has tried significantly hard on.  Spent greater than 35 minutes with patient going over every medication and discussing multiple other ailments that are more for her primary care provider.  Follow-up with me again in 2 to 3 months.  Update 02/07/2020 Patient states that the pain in both knees went rock bottom to the pain of excruciating. States the instaflex that she started a week and a half ago has helped take the edge of and feels like her knees are no longer screaming. Patient is wondering if she should increase her gabapentin.       Past Medical History:  Diagnosis Date  . Abdominal hernia   . Abnormal Pap smear   . ALLERGIC RHINITIS 10/22/2006  . Allergy   . ANEMIA 12/18/2008  . Arthritis    scoliosis moderate deg changes lumbar Xray 11/04/17   . ASYMPTOMATIC POSTMENOPAUSAL STATUS 11/22/2007  . Back pain   . CAD (coronary artery disease)    in LAD  .  Complication of anesthesia   . Degenerative arthritis   . DIABETES MELLITUS, TYPE II 01/13/2007  . Diverticulosis   . Dyslipidemia   . Fibroid   . Frequent headaches   . GERD 07/20/2007  . GOITER, MULTINODULAR 07/20/2007  . Headache(784.0) 07/20/2007  . HEARING LOSS 11/22/2007  . Hiatal hernia   . Hiatal hernia    large  . History of chicken pox   . Hx of colposcopy with cervical biopsy   . HYPERCHOLESTEROLEMIA 01/13/2007  . Hyperglycemia   . HYPERTENSION 10/22/2006  . Lung nodule    unchanged since 05/26/13   . Migraines   . NASH (nonalcoholic steatohepatitis)   . Nocturnal hypoxemia 02/17/2013  . Obesity   . Obstructive sleep apnea   . OSA on CPAP    per neurology  . OSTEOARTHRITIS 10/22/2006  . Other chronic nonalcoholic liver disease 7/40/8144  . Sedimentation rate elevation   . Sleep apnea    on cpap  . Urine incontinence   . UTI (urinary tract infection)    Past Surgical History:  Procedure Laterality Date  . BREAST SURGERY  1984   Breast reduction b/l   . CATARACT EXTRACTION, BILATERAL    . DEXA  08/2005  . DILATION AND CURETTAGE OF UTERUS    . ELECTROCARDIOGRAM  10/15/2006  . ESOPHAGOGASTRODUODENOSCOPY  12/08/2005  . FOOT SURGERY     hammertoe and bunion 05/2017   . Stress Cardiolite  10/21/2005  . sweat gland removal    . WISDOM TOOTH EXTRACTION  Social History   Socioeconomic History  . Marital status: Married    Spouse name: Heather [name]  . Number of children: 2  . Years of education: College  . Highest education level: Not on file  Occupational History  . Occupation: Retired  Tobacco Use  . Smoking status: Former Smoker    Packs/day: 2.00    Years: 20.00    Pack years: 40.00    Types: Cigarettes    Quit date: 03/03/1979    Years since quitting: 40.9  . Smokeless tobacco: Never Used  Substance and Sexual Activity  . Alcohol use: Yes    Comment: rare  . Drug use: No  . Sexual activity: Yes    Birth control/protection: Surgical  Other Topics  Concern  . Not on file  Social History Narrative   Patient is married Heather [name]).   Patient has two children; 3 pregnancies 2 live births    Patient is retired Designer, jewellery, Chiropodist    Patient has a Financial risk analyst.   Patient is right-handed.   Patient lives at home with family.   Caffeine Use: 2 soda every other day   Former smoker 20+ years ago 2 ppd quit in 45s   No guns, wears seat belt, safe in relationship    Social Determinants of Health   Financial Resource Strain: Low Risk   . Difficulty of Paying Living Expenses: Not hard at all  Food Insecurity: No Food Insecurity  . Worried About Charity fundraiser in the Last Year: Never true  . Ran Out of Food in the Last Year: Never true  Transportation Needs: No Transportation Needs  . Lack of Transportation (Medical): No  . Lack of Transportation (Non-Medical): No  Physical Activity:   . Days of Exercise per Week: Not on file  . Minutes of Exercise per Session: Not on file  Stress: No Stress Concern Present  . Feeling of Stress : Not at all  Social Connections: Unknown  . Frequency of Communication with Friends and Family: Not on file  . Frequency of Social Gatherings with Friends and Family: Not on file  . Attends Religious Services: Not on file  . Active Member of Clubs or Organizations: Not on file  . Attends Archivist Meetings: Not on file  . Marital Status: Married   Allergies  Allergen Reactions  . Aspirin   . Coconut Oil   . Lisinopril     REACTION: cough  . Metoprolol Itching  . Peanut-Containing Drug Products   . Penicillins     REACTION: rash  . Prednisone   . Strawberry Extract    Family History  Problem Relation Age of Onset  . Asthma Mother   . Depression Mother   . Bipolar disorder Mother   . Dementia Mother   . Arthritis Mother   . Breast cancer Mother        primary  . Colon cancer Mother        mets from breast  . Cancer Mother         colon  . Hypertension Father   . Migraines Father   . Cancer Maternal Aunt        ?  [name] Heart disease Maternal Grandmother   . Cancer Maternal Grandfather        stomach ?     Current Outpatient Medications (Cardiovascular):  .  furosemide (LASIX) 40 MG tablet, Take 1 tablet (40 mg total) by mouth daily as  needed. .  rosuvastatin (CRESTOR) 40 MG tablet, Take 1 tablet (40 mg total) by mouth daily. At night  Current Outpatient Medications (Respiratory):  .  cetirizine (ZYRTEC) 10 MG tablet, Take 10 mg by mouth daily. [name]  ipratropium (ATROVENT) 0.06 % nasal spray, Place 2 sprays into both nostrils 4 (four) times daily.  Current Outpatient Medications (Analgesics):  .  acetaminophen (TYLENOL) 500 MG tablet, Take 650 mg by mouth 3 (three) times daily as needed for pain.  .  naproxen sodium (ANAPROX) 220 MG tablet, Take 220 mg by mouth as needed. .  traMADol (ULTRAM) 50 MG tablet, Take 50 mg by mouth daily. (Patient not taking: Reported on 02/07/2020)  Current Outpatient Medications (Hematological):  .  clopidogrel (PLAVIX) 75 MG tablet, Take 1 tablet (75 mg total) by mouth daily. Needs appt for future refills .  Cyanocobalamin 1000 MCG CAPS, Take 1 capsule by mouth daily. .  Ferrous Sulfate (IRON) 325 (65 FE) MG TABS, Take 1 tablet by mouth daily.    Current Outpatient Medications (Other):  [name]  Calcium Carbonate-Vitamin D (CALCIUM 600 + D PO), Take 2 tablets by mouth daily.   .  diclofenac Sodium (VOLTAREN) 1 % GEL, Apply topically 4 (four) times daily. [name]  gabapentin (NEURONTIN) 100 MG capsule, Take 2 capsules (200 mg total) by mouth at bedtime. [name]  glucosamine-chondroitin 500-400 MG tablet, Take 1 tablet by mouth 2 (two) times daily.   .  Menthol, Topical Analgesic, (BIOFREEZE COLORLESS) 4 % GEL, Apply topically. .  Multiple Vitamin (MULTIVITAMIN) tablet, Take 1 tablet by mouth daily.   .  Omega-3 Fatty Acids (FISH OIL) 1000 MG CAPS, Take 1 capsule by mouth daily. .  pantoprazole  (PROTONIX) 20 MG tablet, Take 1 tablet (20 mg total) by mouth daily. 30 minutes before food .  polyethylene glycol (MIRALAX / GLYCOLAX) 17 g packet, Take 17 g by mouth daily as needed. .  potassium chloride SA (KLOR-CON M20) 20 MEQ tablet, Take 1 tablet (20 mEq total) by mouth daily. [name]  pyridOXINE (VITAMIN B-6) 100 MG tablet, Take 200 mg by mouth daily. .  SF 5000 PLUS 1.1 % CREA dental cream,  .  topiramate (TOPAMAX) 100 MG tablet, Take 1 tablet (100 mg total) by mouth 2 (two) times daily. .  Turmeric 500 MG TABS, Take 2 tablets by mouth daily. .  Vitamin D, Ergocalciferol, (DRISDOL) 1.25 MG (50000 UNIT) CAPS capsule, Take 1 capsule (50,000 Units total) by mouth every 7 (seven) days. .  vitamin E 200 UNIT capsule, Take 200 Units by mouth daily. [name]  gabapentin (NEURONTIN) 100 MG capsule, Take 3 pills at bedtime   Reviewed prior external information including notes and imaging from  primary care provider As well as notes that were available from care everywhere and other healthcare systems.  Past medical history, social, surgical and family history all reviewed in electronic medical record.  No pertanent information unless stated regarding to the chief complaint.   Review of Systems:  No headache, visual changes, nausea, vomiting, diarrhea, constipation, dizziness, abdominal pain, skin rash, fevers, chills, night sweats, weight loss, swollen lymph nodes,, joint swelling, chest pain, shortness of breath, mood changes. POSITIVE muscle aches, body aches  Objective  Blood pressure 118/62, pulse 70, height 5' 5"  (1.651 m), weight 210 lb (95.3 kg), SpO2 97 %.   General: No apparent distress alert and oriented x3 mood and affect normal, dressed appropriately.  HEENT: Pupils equal, extraocular movements intact  Respiratory: Patient's speak in full sentences and  does not appear short of breath  Cardiovascular: 1+ lower extremity edema, non tender, no erythema  Antalgic gait walking with the aid of a  cane Knee: Bilateral valgus deformity noted. Large thigh to calf ratio.  Tender to palpation over medial and PF joint line.  ROM decreased lacking last 5 degrees of extension in the last 15 degrees of flexion. instability with valgus force.  painful patellar compression. Patellar glide with moderate crepitus. Patellar and quadriceps tendons unremarkable. Hamstring and quadriceps strength is normal.  After informed written and verbal consent, patient was seated on exam table. Right knee was prepped with alcohol swab and utilizing anterolateral approach, patient's right knee space was injected with 4:1  marcaine 0.5%: Kenalog 65m/dL. Patient tolerated the procedure well without immediate complications.  After informed written and verbal consent, patient was seated on exam table. Left knee was prepped with alcohol swab and utilizing anterolateral approach, patient's left knee space was injected with 4:1  marcaine 0.5%: Kenalog 441mdL. Patient tolerated the procedure well without immediate complications.   Impression and Recommendations:     The above documentation has been reviewed and is accurate and complete ZaLyndal PulleyDO

## 2020-02-07 NOTE — Assessment & Plan Note (Signed)
Bilateral injections given again today.  Tolerated the procedure well.  I do believe that with patient history that I do think she needs to consider the possibility of surgical intervention.  Patient has done remarkably well and has a BMI under 35 at this time.  Patient will be referred to orthopedic surgery to discuss the potential for surgical intervention.  Otherwise we only can repeat injections every 10 to 12 weeks.  Chronic problem with exacerbation.  Follow-up with me again in 3 months

## 2020-02-08 ENCOUNTER — Ambulatory Visit (INDEPENDENT_AMBULATORY_CARE_PROVIDER_SITE_OTHER): Payer: Medicare Other | Admitting: Neurology

## 2020-02-08 ENCOUNTER — Encounter: Payer: Self-pay | Admitting: Neurology

## 2020-02-08 VITALS — BP 136/65 | HR 65 | Ht 65.0 in | Wt 215.0 lb

## 2020-02-08 DIAGNOSIS — R413 Other amnesia: Secondary | ICD-10-CM

## 2020-02-08 DIAGNOSIS — I2510 Atherosclerotic heart disease of native coronary artery without angina pectoris: Secondary | ICD-10-CM

## 2020-02-08 MED ORDER — TOPIRAMATE 100 MG PO TABS
100.0000 mg | ORAL_TABLET | Freq: Every day | ORAL | 3 refills | Status: DC
Start: 2020-02-08 — End: 2020-02-08

## 2020-02-08 MED ORDER — TOPIRAMATE 25 MG PO TABS: 75.0000 mg | ORAL_TABLET | Freq: Every day | ORAL | 1 refills | Status: DC

## 2020-02-08 NOTE — Progress Notes (Signed)
Reason for visit: Memory disturbance  Referring physician: Dr. Tonita Phoenix  Heather [name] is a 74 y.o. female  History of present illness:  Heather [name] is a 74 year old right-handed black female with a history of sleep apnea and diabetes.  The patient is on CPAP.  She reports a 5 to 6-year history of some memory issues that have gradually changed over time.  She mainly reports difficulty with word finding, and difficulty remembering names for people.  She has a history of migraine headaches, she has been on Topamax for a number of years but seems to tolerate this fairly well.  She currently is on 100 mg daily of Topamax.  She reports no problems with driving.  She is able to keep up with her medications and appointments fairly well, but occasionally she will get mixed up on dosing of medications.  The patient reports that she sleeps well at night and has a good energy level during the day.  She is doing quite well with her migraines, she only has an occasional headache.  She reports that her mother also had dementia but she had bipolar disorder as well.  The patient is sent to this office for further evaluation.  Past Medical History:  Diagnosis Date  . Abdominal hernia   . Abnormal Pap smear   . ALLERGIC RHINITIS 10/22/2006  . Allergy   . ANEMIA 12/18/2008  . Arthritis    scoliosis moderate deg changes lumbar Xray 11/04/17   . ASYMPTOMATIC POSTMENOPAUSAL STATUS 11/22/2007  . Back pain   . CAD (coronary artery disease)    in LAD  . Complication of anesthesia   . Degenerative arthritis   . DIABETES MELLITUS, TYPE II 01/13/2007  . Diverticulosis   . Dyslipidemia   . Fibroid   . Frequent headaches   . GERD 07/20/2007  . GOITER, MULTINODULAR 07/20/2007  . Headache(784.0) 07/20/2007  . HEARING LOSS 11/22/2007  . Hiatal hernia   . Hiatal hernia    large  . History of chicken pox   . Hx of colposcopy with cervical biopsy   . HYPERCHOLESTEROLEMIA 01/13/2007  . Hyperglycemia    . HYPERTENSION 10/22/2006  . Lung nodule    unchanged since 05/26/13   . Migraines   . NASH (nonalcoholic steatohepatitis)   . Nocturnal hypoxemia 02/17/2013  . Obesity   . Obstructive sleep apnea   . OSA on CPAP    per neurology  . OSTEOARTHRITIS 10/22/2006  . Other chronic nonalcoholic liver disease 4/82/5003  . Sedimentation rate elevation   . Sleep apnea    on cpap  . Urine incontinence   . UTI (urinary tract infection)     Past Surgical History:  Procedure Laterality Date  . BREAST SURGERY  1984   Breast reduction b/l   . CATARACT EXTRACTION, BILATERAL    . DEXA  08/2005  . DILATION AND CURETTAGE OF UTERUS    . ELECTROCARDIOGRAM  10/15/2006  . ESOPHAGOGASTRODUODENOSCOPY  12/08/2005  . FOOT SURGERY     hammertoe and bunion 05/2017   . Stress Cardiolite  10/21/2005  . sweat gland removal    . WISDOM TOOTH EXTRACTION      Family History  Problem Relation Age of Onset  . Asthma Mother   . Depression Mother   . Bipolar disorder Mother   . Dementia Mother   . Arthritis Mother   . Breast cancer Mother        primary  . Colon cancer Mother  mets from breast  . Cancer Mother        colon  . Hypertension Father   . Migraines Father   . Cancer Maternal Aunt        ?  [name] Heart disease Maternal Grandmother   . Cancer Maternal Grandfather        stomach ?    Social history:  reports that she quit smoking about 40 years ago. Her smoking use included cigarettes. She has a 40.00 pack-year smoking history. She has never used smokeless tobacco. She reports current alcohol use. She reports that she does not use drugs.  Medications:  Prior to Admission medications   Medication Sig Start Date End Date Taking? Authorizing Provider  acetaminophen (TYLENOL) 500 MG tablet Take 650 mg by mouth 3 (three) times daily as needed for pain.    Yes [provider]  Calcium Carbonate-Vitamin D (CALCIUM 600 + D PO) Take 2 tablets by mouth daily.   Yes [provider]  cetirizine (ZYRTEC) 10 MG tablet Take 10 mg by mouth daily.   Yes [provider]  clopidogrel (PLAVIX) 75 MG tablet Take 1 tablet (75 mg total) by mouth daily. Needs appt for future refills 01/29/20  Yes Larey Dresser, MD  Cyanocobalamin 1000 MCG CAPS Take 1 capsule by mouth daily.   Yes [provider]  diclofenac Sodium (VOLTAREN) 1 % GEL Apply topically 4 (four) times daily.   Yes [provider]  Ferrous Sulfate (IRON) 325 (65 FE) MG TABS Take 1 tablet by mouth daily.   Yes [provider]  furosemide (LASIX) 40 MG tablet Take 1 tablet (40 mg total) by mouth daily as needed. 01/29/20  Yes McLean-Scocuzza, [name], MD  gabapentin (NEURONTIN) 100 MG capsule Take 2 capsules (200 mg total) by mouth at bedtime. 12/05/19  Yes Lyndal Pulley, DO  gabapentin (NEURONTIN) 100 MG capsule Take 3 pills at bedtime 02/07/20  Yes Lyndal Pulley, DO  glucosamine-chondroitin 500-400 MG tablet Take 1 tablet by mouth 2 (two) times daily.   Yes [provider]  ipratropium (ATROVENT) 0.06 % nasal spray Place 2 sprays into both nostrils 4 (four) times daily. 09/08/18  Yes McLean-Scocuzza, [name], MD  Menthol, Topical Analgesic, (BIOFREEZE COLORLESS) 4 % GEL Apply topically.   Yes [provider]  Multiple Vitamin (MULTIVITAMIN) tablet Take 1 tablet by mouth daily.   Yes [provider]  naproxen sodium (ANAPROX) 220 MG tablet Take 220 mg by mouth as needed.   Yes [provider]  Omega-3 Fatty Acids (FISH OIL) 1000 MG CAPS Take 1 capsule by mouth daily.   Yes [provider]  pantoprazole (PROTONIX) 20 MG tablet Take 1 tablet (20 mg total) by mouth daily. 30 minutes before food 02/14/19  Yes McLean-Scocuzza, [name], MD  polyethylene glycol (MIRALAX / GLYCOLAX) 17 g packet Take 17 g by mouth daily as needed.   Yes [provider]  potassium chloride SA (KLOR-CON M20) 20 MEQ tablet Take 1 tablet (20 mEq total) by mouth  daily. 09/26/19  Yes McLean-Scocuzza, [name], MD  pyridOXINE (VITAMIN B-6) 100 MG tablet Take 200 mg by mouth daily.   Yes [provider]  rosuvastatin (CRESTOR) 40 MG tablet Take 1 tablet (40 mg total) by mouth daily. At night 01/29/20  Yes McLean-Scocuzza, [name], MD  SF 5000 PLUS 1.1 % CREA dental cream  12/27/13  Yes [provider]  topiramate (TOPAMAX) 100 MG tablet Take 1 tablet (  100 mg total) by mouth 2 (two) times daily. 05/24/18  Yes Dohmeier, Asencion Partridge, MD  traMADol (ULTRAM) 50 MG tablet Take 50 mg by mouth daily.   Yes [provider]  Turmeric 500 MG TABS Take 2 tablets by mouth daily.   Yes [provider]  Vitamin D, Ergocalciferol, (DRISDOL) 1.25 MG (50000 UNIT) CAPS capsule Take 1 capsule (50,000 Units total) by mouth every 7 (seven) days. 02/01/20  Yes Beasley, Caren D, MD  vitamin E 200 UNIT capsule Take 200 Units by mouth daily.   Yes [provider]  UNABLE TO FIND Instaflex for knees    [provider]      Allergies  Allergen Reactions  . Aspirin   . Coconut Oil   . Lisinopril     REACTION: cough  . Metoprolol Itching  . Peanut-Containing Drug Products   . Penicillins     REACTION: rash  . Prednisone   . Strawberry Extract     ROS:  Out of a complete 14 system review of symptoms, the patient complains only of the following symptoms, and all other reviewed systems are negative.  Memory problems Occasional headache Arthritis pain  Blood pressure 136/65, pulse 65, height 5' 5"  (1.651 m), weight 215 lb (97.5 kg).  Physical Exam  General: The patient is alert and cooperative at the time of the examination.  The patient is moderately obese.  Eyes: Pupils are equal, round, and reactive to light. Discs are flat bilaterally.  Neck: The neck is supple, no carotid bruits are noted.  Respiratory: The respiratory examination is clear.  Cardiovascular: The cardiovascular examination reveals a regular rate and  rhythm, no obvious murmurs or rubs are noted.  Skin: Extremities are without significant edema.  Neurologic Exam  Mental status: The patient is alert and oriented x 3 at the time of the examination. The patient has apparent normal recent and remote memory, with an apparently normal attention span and concentration ability.  Mini-Mental status examination done today shows a total score of 30/30.  Cranial nerves: Facial symmetry is present. There is good sensation of the face to pinprick and soft touch bilaterally. The strength of the facial muscles and the muscles to head turning and shoulder shrug are normal bilaterally. Speech is well enunciated, no aphasia or dysarthria is noted. Extraocular movements are full. Visual fields are full. The tongue is midline, and the patient has symmetric elevation of the soft palate. No obvious hearing deficits are noted.  Motor: The motor testing reveals 5 over 5 strength of all 4 extremities, with exception of 4/5 strength with hip flexion bilaterally. Good symmetric motor tone is noted throughout.  Sensory: Sensory testing is intact to pinprick, soft touch, vibration sensation, and position sense on all 4 extremities. No evidence of extinction is noted.  Coordination: Cerebellar testing reveals good finger-nose-finger and heel-to-shin bilaterally.  Gait and station: Gait is somewhat wide-based, the patient usually uses a cane for ambulation. Tandem gait was not tested. Romberg is negative. No drift is seen.  Reflexes: Deep tendon reflexes are symmetric, but are depressed bilaterally. Toes are downgoing bilaterally.   Assessment/Plan:  1.  Reported memory disturbance, mild cognitive impairment  2.  Migraine headache  The patient is having some mild memory issues at this time, she has had problems with her memory for many years.  The patient is on Topamax which can cause cognitive clouding, we will try to reduce the dose if she is doing well with her  headache.  We will go up to 75 mg at night and consider a reduction to 50 mg if she does well on this dose.  She will have MRI of the brain, she will follow up through this office in 6 months.   Jill Alexanders MD 02/08/2020 10:47 AM  [facility] [address] [place], [place] [address]  Phone [phone] Fax [phone]

## 2020-02-12 ENCOUNTER — Telehealth: Payer: Self-pay | Admitting: Neurology

## 2020-02-12 NOTE — Telephone Encounter (Signed)
Medicare/arrp order sent to GI. No auth they will reach out to the patient to schedule.

## 2020-02-19 ENCOUNTER — Encounter: Payer: Self-pay | Admitting: Internal Medicine

## 2020-02-20 ENCOUNTER — Ambulatory Visit (INDEPENDENT_AMBULATORY_CARE_PROVIDER_SITE_OTHER): Payer: Medicare Other | Admitting: Family Medicine

## 2020-02-20 ENCOUNTER — Other Ambulatory Visit: Payer: Self-pay

## 2020-02-20 ENCOUNTER — Encounter (INDEPENDENT_AMBULATORY_CARE_PROVIDER_SITE_OTHER): Payer: Self-pay | Admitting: Family Medicine

## 2020-02-20 VITALS — BP 113/69 | HR 64 | Temp 98.4°F | Ht 65.0 in | Wt 206.0 lb

## 2020-02-20 DIAGNOSIS — Z6834 Body mass index (BMI) 34.0-34.9, adult: Secondary | ICD-10-CM | POA: Diagnosis not present

## 2020-02-20 DIAGNOSIS — R1012 Left upper quadrant pain: Secondary | ICD-10-CM | POA: Diagnosis not present

## 2020-02-20 DIAGNOSIS — E66811 Obesity, class 1: Secondary | ICD-10-CM

## 2020-02-20 DIAGNOSIS — E559 Vitamin D deficiency, unspecified: Secondary | ICD-10-CM

## 2020-02-20 DIAGNOSIS — E669 Obesity, unspecified: Secondary | ICD-10-CM | POA: Diagnosis not present

## 2020-02-20 MED ORDER — VITAMIN D (ERGOCALCIFEROL) 1.25 MG (50000 UNIT) PO CAPS: 50000.0000 [IU] | ORAL_CAPSULE | ORAL | 0 refills | Status: DC

## 2020-02-21 NOTE — Progress Notes (Signed)
Chief Complaint:   OBESITY Heather [name] is here to discuss her progress with her obesity treatment plan along with follow-up of her obesity related diagnoses. Heather [name] is on the Category 2 Plan and states she is following her eating plan approximately 66% of the time. Heather [name] states she is on the elliptical for 40 minutes 2-3 times per week.  Today's visit was #: 47 Starting weight: 268 lbs Starting date: 12/23/2016 Today's weight: 206 lbs Today's date: 02/20/2020 Total lbs lost to date: 26 Total lbs lost since last in-office visit: 3  Interim History: Heather [name] has done well with diet and weight loss, but this partly due to recent abdominal pain and nausea which has decreased her eating but not in a healthy way.  Subjective:   1. Vitamin D deficiency Heather [name] is on Vit D, and she denies nausea, vomiting, or muscle weakness. She requests a refill today.  2. Left upper quadrant pain Heather [name] notes an intermittent cramping and abdominal pain, worse in the last month. She denies improvement with change in diet or increase in water, but minimally improved with miralax. She has an appointment with GI in 2 weeks.  Assessment/Plan:   1. Vitamin D deficiency Low Vitamin D level contributes to fatigue and are associated with obesity, breast, and colon cancer. We will refill prescription Vitamin D for 1 month. Heather [name] will follow-up for routine testing of Vitamin D, at least 2-3 times per year to avoid over-replacement.  - Vitamin D, Ergocalciferol, (DRISDOL) 1.25 MG (50000 UNIT) CAPS capsule; Take 1 capsule (50,000 Units total) by mouth every 7 (seven) days.  Dispense: 4 capsule; Refill: 0  2. Left upper quadrant pain Heather [name] was encouraged to stay on a bland diet and increase her water intake, and we will follow up after her GI appointment.  3. Class 1 obesity with serious comorbidity and body mass index (BMI) of 34.0 to 34.9 in adult, unspecified obesity type Heather [name] is currently in the action stage of  change. As such, her goal is to continue with weight loss efforts. She has agreed to the Category 2 Plan.   Heather [name] will work on eating mild foods until her GI appointment, and we will adjust her diet when we get more information.  Exercise goals: As is.  Behavioral modification strategies: holiday eating strategies .  Heather [name] has agreed to follow-up with our clinic in 3 to 4 weeks. She was informed of the importance of frequent follow-up visits to maximize her success with intensive lifestyle modifications for her multiple health conditions.   Objective:   Blood pressure 113/69, pulse 64, temperature 98.4 F (36.9 C), height 5' 5"  (1.651 m), weight 206 lb (93.4 kg), SpO2 100 %. Body mass index is 34.28 kg/m.  General: Cooperative, alert, well developed, in no acute distress. HEENT: Conjunctivae and lids unremarkable. Cardiovascular: Regular rhythm.  Lungs: Normal work of breathing. Neurologic: No focal deficits.   Lab Results  Component Value Date   CREATININE 0.80 12/07/2019   BUN 26 (H) 12/07/2019   NA 139 12/07/2019   K 4.1 12/07/2019   CL 112 (H) 12/07/2019   CO2 25 12/07/2019   Lab Results  Component Value Date   ALT 20 12/07/2019   AST 19 12/07/2019   ALKPHOS 61 12/07/2019   BILITOT 0.3 12/07/2019   Lab Results  Component Value Date   HGBA1C 5.6 10/09/2019   HGBA1C 5.8 (H) 07/05/2019   HGBA1C 5.6 12/05/2018   HGBA1C 5.6 04/27/2018   HGBA1C 6.0 (H) 12/01/2017  Lab Results  Component Value Date   INSULIN 4.3 10/09/2019   INSULIN 8.6 07/05/2019   INSULIN 6.4 12/05/2018   INSULIN 3.6 04/27/2018   INSULIN 5.6 12/01/2017   Lab Results  Component Value Date   TSH 0.93 04/27/2019   Lab Results  Component Value Date   CHOL 186 07/05/2019   HDL 74 07/05/2019   LDLCALC 100 (H) 07/05/2019   TRIG 64 07/05/2019   CHOLHDL 3 03/05/2016   Lab Results  Component Value Date   WBC 6.3 12/07/2019   HGB 12.7 12/07/2019   HCT 40.1 12/07/2019   MCV 97.6 12/07/2019    PLT 203 12/07/2019   Lab Results  Component Value Date   IRON 91 12/07/2019   TIBC 255 12/07/2019   FERRITIN 147 12/07/2019    Obesity Behavioral Intervention:   Approximately 15 minutes were spent on the discussion below.  ASK: We discussed the diagnosis of obesity with Heather [name] today and Heather [name] agreed to give [name] permission to discuss obesity behavioral modification therapy today.  ASSESS: Heather [name] has the diagnosis of obesity and her BMI today is 34.28. Heather [name] is in the action stage of change.   ADVISE: Heather [name] was educated on the multiple health risks of obesity as well as the benefit of weight loss to improve her health. She was advised of the need for long term treatment and the importance of lifestyle modifications to improve her current health and to decrease her risk of future health problems.  AGREE: Multiple dietary modification options and treatment options were discussed and Heather [name] agreed to follow the recommendations documented in the above note.  ARRANGE: Heather [name] was educated on the importance of frequent visits to treat obesity as outlined per CMS and USPSTF guidelines and agreed to schedule her next follow up appointment today.  Attestation Statements:   Reviewed by clinician on day of visit: allergies, medications, problem list, medical history, surgical history, family history, social history, and previous encounter notes.   I, [name], am acting as transcriptionist for [name], MD.  I have reviewed the above documentation for accuracy and completeness, and I agree with the above. -  [name], MD

## 2020-02-29 ENCOUNTER — Ambulatory Visit: Payer: Medicare Other | Admitting: Podiatry

## 2020-03-06 ENCOUNTER — Ambulatory Visit (INDEPENDENT_AMBULATORY_CARE_PROVIDER_SITE_OTHER): Payer: Medicare Other | Admitting: Gastroenterology

## 2020-03-06 ENCOUNTER — Other Ambulatory Visit: Payer: Self-pay | Admitting: Neurology

## 2020-03-06 ENCOUNTER — Encounter: Payer: Self-pay | Admitting: Gastroenterology

## 2020-03-06 VITALS — BP 126/70 | HR 72 | Ht 63.5 in | Wt 219.5 lb

## 2020-03-06 DIAGNOSIS — K3189 Other diseases of stomach and duodenum: Secondary | ICD-10-CM | POA: Diagnosis not present

## 2020-03-06 DIAGNOSIS — K449 Diaphragmatic hernia without obstruction or gangrene: Secondary | ICD-10-CM | POA: Diagnosis not present

## 2020-03-06 DIAGNOSIS — K219 Gastro-esophageal reflux disease without esophagitis: Secondary | ICD-10-CM

## 2020-03-06 DIAGNOSIS — K5904 Chronic idiopathic constipation: Secondary | ICD-10-CM

## 2020-03-06 MED ORDER — ALPRAZOLAM 0.5 MG PO TABS: ORAL_TABLET | ORAL | 0 refills | Status: DC

## 2020-03-06 NOTE — Progress Notes (Signed)
Heather [name]    [id number]    10-24-1945  Primary Care Physician:McLean-Scocuzza, [name], MD  Referring Physician: McLean-Scocuzza, [name], MD [place],  [place] [address]   Chief complaint:  Hiatal hernia, abdominal pain, nausea, constipation  HPI:  75 year old very pleasant female here with complaints of severe abdominal pain, nausea, regurgitation and constipation  She developed generalized abdominal pain, pain started started before christmas, lasted for a week. She has been having on and off symptoms  She had nause with dry heaving and abdominal cramping. Pepto helped but did not completely improve  She was also experiencing significant constipation, was passing pellets. She started taking Miralax and her bowel movements have subsequently improved. She is currently feeling better, denies any abdominal pain, nausea or regurgitation.  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  Subsequently she saw [name] in [facility], she was very pleased with him and thought about doing the surgery but did not go ahead with it  given the logistics of commute to [facility]  CT pelvis 0/34/91: Periumbilical abdominal wall hernia containing fat, small bowel loop and appendix, no obstruction or incarceration. Diverticulosis, no diverticulitis. Uterine fibroids.  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  Pancolonic diverticulosis, 5 mm polyp removed from sigmoid colon, hypertrophied anal papilla otherwise unremarkable exam  Outpatient Encounter Medications as of 03/06/2020  Medication Sig  . acetaminophen (TYLENOL) 500 MG tablet Take 650 mg by mouth 3 (three) times daily as needed for pain.   [name] ALPRAZolam (XANAX) 0.5 MG tablet Take 2 tablets approximately 45 minutes prior to the MRI study, take a third tablet if needed.  . Calcium Carbonate-Vitamin D (CALCIUM 600 + D PO) Take 2 tablets by mouth daily.  . cetirizine (ZYRTEC) 10 MG tablet Take 10 mg by mouth daily.  . clopidogrel (PLAVIX) 75 MG tablet Take 1 tablet (75 mg total) by mouth daily. Needs appt for future refills  . Cyanocobalamin 1000 MCG CAPS Take 1 capsule by mouth daily.  . diclofenac Sodium (VOLTAREN) 1 % GEL Apply topically 4 (four) times daily.  . Ferrous Sulfate (IRON) 325 (65 FE) MG TABS Take 1 tablet by mouth daily.  . furosemide (LASIX) 40 MG tablet Take 1 tablet (40 mg total) by mouth daily as needed.  . gabapentin (NEURONTIN) 100 MG capsule Take 3 pills at bedtime  . glucosamine-chondroitin 500-400 MG tablet Take 1 tablet by mouth 2 (two) times daily.  [name] ipratropium (ATROVENT) 0.06 % nasal spray Place 2 sprays into both nostrils 4 (four) times daily.  . Menthol, Topical Analgesic, (BIOFREEZE COLORLESS) 4 % GEL Apply topically.  . Multiple Vitamin (MULTIVITAMIN) tablet Take 1 tablet by mouth daily.  . naproxen sodium (ANAPROX) 220 MG tablet Take 220 mg by mouth as needed.  . Omega-3 Fatty Acids (FISH OIL) 1000 MG CAPS Take 1 capsule  by mouth daily.  . pantoprazole (PROTONIX) 20 MG tablet Take 1 tablet (20 mg total) by mouth daily. 30 minutes before food  . polyethylene glycol (MIRALAX / GLYCOLAX) 17 g packet Take 17 g by mouth daily as needed.  . potassium chloride SA (KLOR-CON M20) 20 MEQ tablet Take 1 tablet (20 mEq total) by mouth daily.   [name] pyridOXINE (VITAMIN B-6) 100 MG tablet Take 200 mg by mouth daily.  . rosuvastatin (CRESTOR) 40 MG tablet Take 1 tablet (40 mg total) by mouth daily. At night  . SF 5000 PLUS 1.1 % CREA dental cream   . topiramate (TOPAMAX) 25 MG tablet Take 3 tablets (75 mg total) by mouth at bedtime.  . traMADol (ULTRAM) 50 MG tablet Take 50 mg by mouth daily.  . Turmeric 500 MG TABS Take 2 tablets by mouth daily.  [name] UNABLE TO FIND Instaflex for knees  . Vitamin D, Ergocalciferol, (DRISDOL) 1.25 MG (50000 UNIT) CAPS capsule Take 1 capsule (50,000 Units total) by mouth every 7 (seven) days.  . vitamin E 200 UNIT capsule Take 200 Units by mouth daily.  . [DISCONTINUED] gabapentin (NEURONTIN) 100 MG capsule Take 2 capsules (200 mg total) by mouth at bedtime.   No facility-administered encounter medications on file as of 03/06/2020.    Allergies as of 03/06/2020 - Review Complete 03/06/2020  Allergen Reaction Noted  . Aspirin  08/04/2011  . Coconut oil  07/10/2013  . Lisinopril  06/03/2006  . Metoprolol Itching 02/12/2014  . Peanut-containing drug products  07/10/2013  . Penicillins  06/03/2006  . Prednisone  07/12/2012  . Strawberry extract  07/10/2013    Past Medical History:  Diagnosis Date  . Abdominal hernia   . Abnormal Pap smear   . ALLERGIC RHINITIS 10/22/2006  . Allergy   . ANEMIA 12/18/2008  . Arthritis    scoliosis moderate deg changes lumbar Xray 11/04/17   . ASYMPTOMATIC POSTMENOPAUSAL STATUS 11/22/2007  . Back pain   . CAD (coronary artery disease)    in LAD  . Complication of anesthesia   . Degenerative arthritis   . DIABETES MELLITUS, TYPE II 01/13/2007  . Diverticulosis   . Dyslipidemia   . Fibroid   . Frequent headaches   . GERD 07/20/2007  . GOITER, MULTINODULAR 07/20/2007  . Headache(784.0) 07/20/2007  . HEARING LOSS 11/22/2007  . Hiatal hernia   . Hiatal hernia    large  . History of chicken pox   . Hx of colposcopy with cervical biopsy   . HYPERCHOLESTEROLEMIA  01/13/2007  . Hyperglycemia   . HYPERTENSION 10/22/2006  . Lung nodule    unchanged since 05/26/13   . Migraines   . NASH (nonalcoholic steatohepatitis)   . Nocturnal hypoxemia 02/17/2013  . Obesity   . Obstructive sleep apnea   . OSA on CPAP    per neurology  . OSTEOARTHRITIS 10/22/2006  . Other chronic nonalcoholic liver disease 4/74/2595  . Sedimentation rate elevation   . Sleep apnea    on cpap  . Urine incontinence   . UTI (urinary tract infection)     Past Surgical History:  Procedure Laterality Date  . BREAST SURGERY  1984   Breast reduction b/l   . CATARACT EXTRACTION, BILATERAL    . DEXA  08/2005  . DILATION AND CURETTAGE OF UTERUS    . ELECTROCARDIOGRAM  10/15/2006  . ESOPHAGOGASTRODUODENOSCOPY  12/08/2005  . FOOT SURGERY     hammertoe and bunion 05/2017   . Stress Cardiolite  10/21/2005  .  sweat gland removal    . WISDOM TOOTH EXTRACTION      Family History  Problem Relation Age of Onset  . Asthma Mother   . Depression Mother   . Bipolar disorder Mother   . Dementia Mother   . Arthritis Mother   . Breast cancer Mother        primary  . Colon cancer Mother        mets from breast  . Cancer Mother        colon  . Hypertension Father   . Migraines Father   . Cancer Maternal Aunt        ?  [name] Heart disease Maternal Grandmother   . Cancer Maternal Grandfather        stomach ?    Social History   Socioeconomic History  . Marital status: Married    Spouse name: [name]  . Number of children: 2  . Years of education: College  . Highest education level: Not on file  Occupational History  . Occupation: Retired  Tobacco Use  . Smoking status: Former Smoker    Packs/day: 2.00    Years: 20.00    Pack years: 40.00    Types: Cigarettes    Quit date: 03/03/1979    Years since quitting: 41.0  . Smokeless tobacco: Never Used  Substance and Sexual Activity  . Alcohol use: Yes    Comment: rare  . Drug use: No  . Sexual activity: Yes    Birth  control/protection: Surgical  Other Topics Concern  . Not on file  Social History Narrative   Patient is married [name]).   Right Handed   Drinks 1 cup caffeine   Social Determinants of Health   Financial Resource Strain: Low Risk   . Difficulty of Paying Living Expenses: Not hard at all  Food Insecurity: No Food Insecurity  . Worried About Charity fundraiser in the Last Year: Never true  . Ran Out of Food in the Last Year: Never true  Transportation Needs: No Transportation Needs  . Lack of Transportation (Medical): No  . Lack of Transportation (Non-Medical): No  Physical Activity: Not on file  Stress: No Stress Concern Present  . Feeling of Stress : Not at all  Social Connections: Unknown  . Frequency of Communication with Friends and Family: Not on file  . Frequency of Social Gatherings with Friends and Family: Not on file  . Attends Religious Services: Not on file  . Active Member of Clubs or Organizations: Not on file  . Attends Archivist Meetings: Not on file  . Marital Status: Married  Human resources officer Violence: Not At Risk  . Fear of Current or Ex-Partner: No  . Emotionally Abused: No  . Physically Abused: No  . Sexually Abused: No      Review of systems: All other review of systems negative except as mentioned in the HPI.   Physical Exam: Vitals:   03/06/20 1533  BP: 126/70  Pulse: 72   Body mass index is 38.27 kg/m. Gen:      No acute distress HEENT:  sclera anicteric Abd:      soft, non-tender; no palpable masses, no distension Ext:    No edema Neuro: alert and oriented x 3 Psych: normal mood and affect  Data Reviewed:  Reviewed labs, radiology imaging, old records and pertinent past GI work up   Assessment and Plan/Recommendations:  75 year old very pleasant female with large hiatal hernia involving the entire  stomach, part of colon and small bowel and large ventral hernia.  Episode of severe generalized abdominal pain  associated with nausea, regurgitation and constipation is concerning for possible bowel obstruction or gastric volvulus, her symptoms have since resolved  Advised patient to come to ER if she has a severe episode where she is not tolerating any fluids, unable to pass flatus and has recurrent vomiting  GERD: Continue Protonix Discussed antireflux measures and lifestyle modifications  Patient does not want to proceed with hiatal hernia repair due to potential risks associated with it  Family history of colon cancer and personal history of adenomatous colon polyps, due for surveillance colonoscopy in April 2023  Chronic idiopathic constipation: Continue MiraLAX 1 capful daily to have daily soft bowel movement  Return as needed   The patient was provided an opportunity to ask questions and all were answered. The patient agreed with the plan and demonstrated an understanding of the instructions.  Damaris Hippo , MD    CC: McLean-Scocuzza, Olivia Mackie *

## 2020-03-06 NOTE — Patient Instructions (Signed)
Follow up as needed  I appreciate the  opportunity to care for you  Thank You   Harl Bowie , MD

## 2020-03-12 ENCOUNTER — Encounter: Payer: Self-pay | Admitting: Internal Medicine

## 2020-03-12 ENCOUNTER — Other Ambulatory Visit: Payer: Self-pay

## 2020-03-12 ENCOUNTER — Telehealth (INDEPENDENT_AMBULATORY_CARE_PROVIDER_SITE_OTHER): Payer: Medicare Other | Admitting: Internal Medicine

## 2020-03-12 VITALS — BP 125/66 | HR 66 | Ht 63.5 in | Wt 210.0 lb

## 2020-03-12 DIAGNOSIS — D72819 Decreased white blood cell count, unspecified: Secondary | ICD-10-CM

## 2020-03-12 DIAGNOSIS — R413 Other amnesia: Secondary | ICD-10-CM

## 2020-03-12 DIAGNOSIS — R7303 Prediabetes: Secondary | ICD-10-CM | POA: Diagnosis not present

## 2020-03-12 DIAGNOSIS — G8929 Other chronic pain: Secondary | ICD-10-CM | POA: Diagnosis not present

## 2020-03-12 DIAGNOSIS — Z1389 Encounter for screening for other disorder: Secondary | ICD-10-CM

## 2020-03-12 DIAGNOSIS — Z1329 Encounter for screening for other suspected endocrine disorder: Secondary | ICD-10-CM | POA: Diagnosis not present

## 2020-03-12 DIAGNOSIS — Z0000 Encounter for general adult medical examination without abnormal findings: Secondary | ICD-10-CM

## 2020-03-12 DIAGNOSIS — M25562 Pain in left knee: Secondary | ICD-10-CM | POA: Diagnosis not present

## 2020-03-12 DIAGNOSIS — R790 Abnormal level of blood mineral: Secondary | ICD-10-CM | POA: Diagnosis not present

## 2020-03-12 DIAGNOSIS — M25561 Pain in right knee: Secondary | ICD-10-CM | POA: Diagnosis not present

## 2020-03-12 DIAGNOSIS — Z13220 Encounter for screening for lipoid disorders: Secondary | ICD-10-CM

## 2020-03-12 NOTE — Progress Notes (Signed)
Virtual Visit via Video Note  I connected with Heather [name]  on 03/12/20 at  1:45PM EST by a video enabled telemedicine application and verified that I am speaking with the correct person using two identifiers.  Location patient: home, [place] Location provider:work or home office Persons participating in the virtual visit: patient, provider, pts husband   I discussed the limitations of evaluation and management by telemedicine and the availability of in person appointments. The patient expressed understanding and agreed to proceed.   HPI: 1. Stomach pain/Constipation resolved with miralax qd for now she has 3-4 stools daily  She was seen by Dr. Burna Mortimer 03/06/20 but she did have 10 days of low intensity ab pain and n/v which is improved  2. Memory loss f/u [facility] and MRI brain sch next week and they wanted to reduce her topamax then f/u  Copper level was sl elevated 18710/7/21 we reviewed foods high copper she does eat leafy greens a lot and black pepper disc some foods high copper reviewed and in moderation  3. Leukopenia f/u Dr Irene Limbo h/o prn no issues found in h/o w/u  4. Right thumb pain resolved with bracing with Dr. Tamala Julian has not used tramadol yet and no pain today but keeps this prn  Intermittent b/l knee pain has voltaren gel and will f/u Dr. Tamala Julian or disc consider ortho in the future  ROS: See pertinent positives and negatives per HPI.  Past Medical History:  Diagnosis Date   Abdominal hernia    Abnormal Pap smear    ALLERGIC RHINITIS 10/22/2006   Allergy    ANEMIA 12/18/2008   Arthritis    scoliosis moderate deg changes lumbar Xray 11/04/17    ASYMPTOMATIC POSTMENOPAUSAL STATUS 11/22/2007   Back pain    CAD (coronary artery disease)    in LAD   Complication of anesthesia    Degenerative arthritis    DIABETES MELLITUS, TYPE II 01/13/2007   Diverticulosis    Dyslipidemia    Fibroid    Frequent headaches    GERD 07/20/2007   GOITER, MULTINODULAR 07/20/2007    Headache(784.0) 07/20/2007   HEARING LOSS 11/22/2007   Hiatal hernia    Hiatal hernia    large   History of chicken pox    Hx of colposcopy with cervical biopsy    HYPERCHOLESTEROLEMIA 01/13/2007   Hyperglycemia    HYPERTENSION 10/22/2006   Lung nodule    unchanged since 05/26/13    Migraines    NASH (nonalcoholic steatohepatitis)    Nocturnal hypoxemia 02/17/2013   Obesity    Obstructive sleep apnea    OSA on CPAP    per neurology   OSTEOARTHRITIS 10/22/2006   Other chronic nonalcoholic liver disease 03/20/4172   Sedimentation rate elevation    Sleep apnea    on cpap   Urine incontinence    UTI (urinary tract infection)     Past Surgical History:  Procedure Laterality Date   BREAST SURGERY  1984   Breast reduction b/l    CATARACT EXTRACTION, BILATERAL     DEXA  08/2005   DILATION AND CURETTAGE OF UTERUS     ELECTROCARDIOGRAM  10/15/2006   ESOPHAGOGASTRODUODENOSCOPY  12/08/2005   FOOT SURGERY     hammertoe and bunion 05/2017    Stress Cardiolite  10/21/2005   sweat gland removal     WISDOM TOOTH EXTRACTION       Current Outpatient Medications:    acetaminophen (TYLENOL) 500 MG tablet, Take 650 mg by mouth 3 (three) times  daily as needed for pain. , Disp: , Rfl:    ALPRAZolam (XANAX) 0.5 MG tablet, Take 2 tablets approximately 45 minutes prior to the MRI study, take a third tablet if needed., Disp: 3 tablet, Rfl: 0   Calcium Carbonate-Vitamin D (CALCIUM 600 + D PO), Take 2 tablets by mouth daily., Disp: , Rfl:    cetirizine (ZYRTEC) 10 MG tablet, Take 10 mg by mouth daily., Disp: , Rfl:    clopidogrel (PLAVIX) 75 MG tablet, Take 1 tablet (75 mg total) by mouth daily. Needs appt for future refills, Disp: 90 tablet, Rfl: 3   Cyanocobalamin 1000 MCG CAPS, Take 1 capsule by mouth daily., Disp: , Rfl:    diclofenac Sodium (VOLTAREN) 1 % GEL, Apply topically 4 (four) times daily., Disp: , Rfl:    Ferrous Sulfate (IRON) 325 (65 FE) MG TABS,  Take 1 tablet by mouth daily., Disp: , Rfl:    furosemide (LASIX) 40 MG tablet, Take 1 tablet (40 mg total) by mouth daily as needed., Disp: 90 tablet, Rfl: 1   gabapentin (NEURONTIN) 100 MG capsule, Take 3 pills at bedtime, Disp: 270 capsule, Rfl: 3   glucosamine-chondroitin 500-400 MG tablet, Take 1 tablet by mouth 2 (two) times daily., Disp: , Rfl:    Multiple Vitamin (MULTIVITAMIN) tablet, Take 1 tablet by mouth daily., Disp: , Rfl:    Omega-3 Fatty Acids (FISH OIL) 1000 MG CAPS, Take 1 capsule by mouth daily., Disp: , Rfl:    pantoprazole (PROTONIX) 20 MG tablet, Take 1 tablet (20 mg total) by mouth daily. 30 minutes before food, Disp: 90 tablet, Rfl: 3   polyethylene glycol (MIRALAX / GLYCOLAX) 17 g packet, Take 17 g by mouth daily as needed., Disp: , Rfl:    potassium chloride SA (KLOR-CON M20) 20 MEQ tablet, Take 1 tablet (20 mEq total) by mouth daily., Disp: 90 tablet, Rfl: 1   pyridOXINE (VITAMIN B-6) 100 MG tablet, Take 200 mg by mouth daily., Disp: , Rfl:    rosuvastatin (CRESTOR) 40 MG tablet, Take 1 tablet (40 mg total) by mouth daily. At night, Disp: 90 tablet, Rfl: 1   SF 5000 PLUS 1.1 % CREA dental cream, , Disp: , Rfl: 0   topiramate (TOPAMAX) 25 MG tablet, Take 3 tablets (75 mg total) by mouth at bedtime., Disp: 270 tablet, Rfl: 1   Turmeric 500 MG TABS, Take 2 tablets by mouth daily., Disp: , Rfl:    UNABLE TO FIND, Instaflex for knees, Disp: , Rfl:    vitamin E 200 UNIT capsule, Take 200 Units by mouth daily., Disp: , Rfl:    Menthol, Topical Analgesic, (BIOFREEZE COLORLESS) 4 % GEL, Apply topically., Disp: , Rfl:    traMADol (ULTRAM) 50 MG tablet, Take 50 mg by mouth daily., Disp: , Rfl:    Vitamin D, Ergocalciferol, (DRISDOL) 1.25 MG (50000 UNIT) CAPS capsule, Take 1 capsule (50,000 Units total) by mouth every 7 (seven) days., Disp: 4 capsule, Rfl: 0  EXAM:  VITALS per patient if applicable:  GENERAL: alert, oriented, appears well and in no acute  distress  HEENT: atraumatic, conjunttiva clear, no obvious abnormalities on inspection of external nose and ears  NECK: normal movements of the head and neck  LUNGS: on inspection no signs of respiratory distress, breathing rate appears normal, no obvious gross SOB, gasping or wheezing  CV: no obvious cyanosis  MS: moves all visible extremities without noticeable abnormality  PSYCH/NEURO: pleasant and cooperative, no obvious depression or anxiety, speech and thought processing  grossly intact  ASSESSMENT AND PLAN:  Discussed the following assessment and plan:  Memory loss -pending MRI next week f/u neurology    Leukopenia, unspecified type - Plan: CBC with Differential/Platelet, Pathologist smear review  Chronic pain of both knees - Plan:  F/u with Dr Tamala Julian  voltaren gel  If needed ortho   Prediabetes - Plan: Hemoglobin A1c, Microalbumin / creatinine urine ratio, Urinalysis, Routine w reflex microscopic Diet controlled   HM Fasting labs 05/2020  Flu shot high doseutd prevnar had 01/30/14 pna 23given 01/18/2019 zostavax had 01/30/14 -consider shingrix given Rx for this prev11/18/20 Tdap 01/30/14 covid 19 3/3  DEXA 03/06/15 normal   mammo copy get from Dr. Leo Gross[facility] -per pt she will f/u with Dr. Mancel Bale who ordered her mammogram -mammogram 07/03/19 normal  Pap out of age window  Colonoscopy 06/18/16 severe diverticulosis polyp x 1 tubular adenoma FH mom colon cancer f/u in 5 years  HCV neg 12/28/11  [place] [facility] exam due as of 03/12/20   Former smoker quit in 62s smoker x 20 years+ 2 ppd. CT 01/14/18 mild COPD (off Advair), stable goiter, mild CAD, stable left lung nodule lower lobe likely benign  CT chest 12/2017 benign lung nodules noted  Other providers [facility] Dr. Leafy Ro, congratulated  Sports medicine Dr. Tamala Julian Cardiologist Dr. [name] [place] [facility] as of 03/12/20 needs to sch   -we discussed possible serious and likely  etiologies, options for evaluation and workup, limitations of telemedicine visit vs in person visit, treatment, treatment risks and precautions.   I discussed the assessment and treatment plan with the patient. The patient was provided an opportunity to ask questions and all were answered. The patient agreed with the plan and demonstrated an understanding of the instructions.    Time spent 20 min Delorise Jackson, MD

## 2020-03-14 ENCOUNTER — Encounter (INDEPENDENT_AMBULATORY_CARE_PROVIDER_SITE_OTHER): Payer: Self-pay | Admitting: Family Medicine

## 2020-03-14 ENCOUNTER — Telehealth: Payer: Self-pay

## 2020-03-14 ENCOUNTER — Telehealth (INDEPENDENT_AMBULATORY_CARE_PROVIDER_SITE_OTHER): Payer: Medicare Other | Admitting: Family Medicine

## 2020-03-14 ENCOUNTER — Other Ambulatory Visit: Payer: Self-pay

## 2020-03-14 ENCOUNTER — Telehealth (INDEPENDENT_AMBULATORY_CARE_PROVIDER_SITE_OTHER): Payer: Self-pay

## 2020-03-14 DIAGNOSIS — E559 Vitamin D deficiency, unspecified: Secondary | ICD-10-CM | POA: Diagnosis not present

## 2020-03-14 DIAGNOSIS — Z6834 Body mass index (BMI) 34.0-34.9, adult: Secondary | ICD-10-CM

## 2020-03-14 DIAGNOSIS — R109 Unspecified abdominal pain: Secondary | ICD-10-CM | POA: Insufficient documentation

## 2020-03-14 DIAGNOSIS — E669 Obesity, unspecified: Secondary | ICD-10-CM

## 2020-03-14 DIAGNOSIS — R790 Abnormal level of blood mineral: Secondary | ICD-10-CM | POA: Insufficient documentation

## 2020-03-14 DIAGNOSIS — D72819 Decreased white blood cell count, unspecified: Secondary | ICD-10-CM | POA: Insufficient documentation

## 2020-03-14 MED ORDER — VITAMIN D (ERGOCALCIFEROL) 1.25 MG (50000 UNIT) PO CAPS: 50000.0000 [IU] | ORAL_CAPSULE | ORAL | 0 refills | Status: DC

## 2020-03-14 NOTE — Telephone Encounter (Signed)
lvm to Return for mid 05/2020 fasting labs 8-10 am and f/u in 3-4 months .

## 2020-03-14 NOTE — Telephone Encounter (Signed)
Verbal consent obtained to conduct video visit, via telehealth

## 2020-03-19 NOTE — Progress Notes (Signed)
TeleHealth Visit:  Due to the COVID-19 pandemic, this visit was completed with telemedicine (audio/video) technology to reduce patient and provider exposure as well as to preserve personal protective equipment.   Heather [name] has verbally consented to this TeleHealth visit. The patient is located at home, the provider is located at the [facility] office. The participants in this visit include the listed provider and patient. The visit was conducted today via video.  Chief Complaint: OBESITY Heather [name] is here to discuss her progress with her obesity treatment plan along with follow-up of her obesity related diagnoses. Heather [name] is on the Category 2 Plan and keeping a food journal and adhering to recommended goals of 1200 calories and ? protein and states she is following her eating plan approximately 95% of the time. Heather [name] states she is elliptical 30 minutes 3 times per week.  Today's visit was #: 38 Starting weight: 268 lbs Starting date: 12/23/2016  Interim History: Heather [name] last OV with Dr. Leafy Ro was 01/2020. This is her first visit with me. Patient states she had a rough time over the holidays and thinks she may have gained a couple of pounds. She notes hunger and cravings are well controlled when she eats on plan. She does Category 2 but also journals to keep herself on track. She is back on following plan the past couple of days with no issues.  Assessment/Plan:   1. Abdominal discomfort Heather [name] recently had bouts of GI upset with nausea and change in stoolings. Patient seen by GI doctor and was told workup showed constipation. She is taking Miralax and prunes and try to increase water intake.   Plan: Continue current treatment plan, per GI doctor. Increase fluids, stool softeners prn, increase movement and prudent nutritional plan. Symptoms improving currently. We will monitor alongside GI.  2. Vitamin D deficiency Heather [name]'s Vitamin D level was 42.0 on 10/09/2019. She is currently  taking prescription vitamin D 50,000 IU each week. She denies nausea, vomiting or muscle weakness.  Ref. Range 10/09/2019 14:25  Vitamin D, 25-Hydroxy Latest Ref Range: 30.0 - 100.0 ng/mL 42.0   Plan: Refill Vit D for 1 month, as per below. - Reiterated importance of vitamin D (as well as calcium) to their health and wellbeing.  - Reminded Heather [name] that weight loss will likely improve availability of vitamin D, thus encouraged her to continue with meal plan and their weight loss efforts to further improve this condition. - I recommend patient continue to take weekly prescription vit D 50,000 IU - Informed patient this may be a lifelong thing, and she was encouraged to continue to take the medicine until told otherwise.   - we will need to monitor levels regularly (every 3-4 mo on average) to keep levels within normal limits.  - weight loss will likely improve availability of vitamin D, thus encouraged Heather [name] to continue with meal plan and their weight loss efforts to further improve this condition - pt's questions and concerns regarding this condition addressed.  Refill- Vitamin D, Ergocalciferol, (DRISDOL) 1.25 MG (50000 UNIT) CAPS capsule; Take 1 capsule (50,000 Units total) by mouth every 7 (seven) days.  Dispense: 4 capsule; Refill: 0  3. Class 1 obesity with serious comorbidity and body mass index (BMI) of 34.0 to 34.9 in adult, unspecified obesity type Heather [name] is currently in the action stage of change. As such, her goal is to continue with weight loss efforts. She has agreed to the Category 2 Plan and keeping a food journal and  adhering to recommended goals of 1200 calories and ? protein.   Exercise goals: As is  Behavioral modification strategies: meal planning and cooking strategies, keeping healthy foods in the home and planning for success.  Heather [name] has agreed to follow-up with our clinic in 2-3 weeks on 04/15/2020 at 1240. She was informed of the importance of frequent follow-up  visits to maximize her success with intensive lifestyle modifications for her multiple health conditions.  Objective:   VITALS: Per patient if applicable, see vitals. GENERAL: Alert and in no acute distress. CARDIOPULMONARY: No increased WOB. Speaking in clear sentences.  PSYCH: Pleasant and cooperative. Speech normal rate and rhythm. Affect is appropriate. Insight and judgement are appropriate. Attention is focused, linear, and appropriate.  NEURO: Oriented as arrived to appointment on time with no prompting.   Lab Results  Component Value Date   CREATININE 0.80 12/07/2019   BUN 26 (H) 12/07/2019   NA 139 12/07/2019   K 4.1 12/07/2019   CL 112 (H) 12/07/2019   CO2 25 12/07/2019   Lab Results  Component Value Date   ALT 20 12/07/2019   AST 19 12/07/2019   ALKPHOS 61 12/07/2019   BILITOT 0.3 12/07/2019   Lab Results  Component Value Date   HGBA1C 5.6 10/09/2019   HGBA1C 5.8 (H) 07/05/2019   HGBA1C 5.6 12/05/2018   HGBA1C 5.6 04/27/2018   HGBA1C 6.0 (H) 12/01/2017   Lab Results  Component Value Date   INSULIN 4.3 10/09/2019   INSULIN 8.6 07/05/2019   INSULIN 6.4 12/05/2018   INSULIN 3.6 04/27/2018   INSULIN 5.6 12/01/2017   Lab Results  Component Value Date   TSH 0.93 04/27/2019   Lab Results  Component Value Date   CHOL 186 07/05/2019   HDL 74 07/05/2019   LDLCALC 100 (H) 07/05/2019   TRIG 64 07/05/2019   CHOLHDL 3 03/05/2016   Lab Results  Component Value Date   WBC 6.3 12/07/2019   HGB 12.7 12/07/2019   HCT 40.1 12/07/2019   MCV 97.6 12/07/2019   PLT 203 12/07/2019   Lab Results  Component Value Date   IRON 91 12/07/2019   TIBC 255 12/07/2019   FERRITIN 147 12/07/2019    Attestation Statements:   Reviewed by clinician on day of visit: allergies, medications, problem list, medical history, surgical history, family history, social history, and previous encounter notes.  Coral Ceo, am acting as Location manager for [employer],  DO.  I have reviewed the above documentation for accuracy and completeness, and I agree with the above. Marjory Sneddon, D.O.  The [place] was signed into law in 2016 which includes the topic of electronic health records.  This provides immediate access to information in MyChart.  This includes consultation notes, operative notes, office notes, lab results and pathology reports.  If you have any questions about what you read please let us know at your next visit so we can discuss your concerns and take corrective action if need be.  We are right here with you.

## 2020-03-21 ENCOUNTER — Other Ambulatory Visit: Payer: Medicare Other

## 2020-03-21 ENCOUNTER — Encounter: Payer: Self-pay | Admitting: Gastroenterology

## 2020-03-26 DIAGNOSIS — M1712 Unilateral primary osteoarthritis, left knee: Secondary | ICD-10-CM | POA: Diagnosis not present

## 2020-03-26 DIAGNOSIS — M1711 Unilateral primary osteoarthritis, right knee: Secondary | ICD-10-CM | POA: Diagnosis not present

## 2020-03-28 ENCOUNTER — Encounter: Payer: Self-pay | Admitting: Podiatry

## 2020-03-28 ENCOUNTER — Ambulatory Visit (INDEPENDENT_AMBULATORY_CARE_PROVIDER_SITE_OTHER): Payer: Medicare Other | Admitting: Podiatry

## 2020-03-28 ENCOUNTER — Other Ambulatory Visit: Payer: Self-pay

## 2020-03-28 DIAGNOSIS — M79676 Pain in unspecified toe(s): Secondary | ICD-10-CM

## 2020-03-28 DIAGNOSIS — D689 Coagulation defect, unspecified: Secondary | ICD-10-CM | POA: Diagnosis not present

## 2020-03-28 DIAGNOSIS — B351 Tinea unguium: Secondary | ICD-10-CM

## 2020-03-29 NOTE — Progress Notes (Signed)
She presents today chief complaint of painfully elongated toenails.  Objective: Toenails are long thick yellow dystrophic-like mycotic pulses remain palpable.  No open lesions or wounds are noted.  Assessment: Pain in limb secondary to onychomycosis.  Plan: Debridement of toenails 1 through 5 bilateral.

## 2020-04-09 ENCOUNTER — Other Ambulatory Visit: Payer: Self-pay

## 2020-04-09 ENCOUNTER — Ambulatory Visit (INDEPENDENT_AMBULATORY_CARE_PROVIDER_SITE_OTHER): Payer: Medicare Other | Admitting: Family Medicine

## 2020-04-09 ENCOUNTER — Encounter: Payer: Self-pay | Admitting: Family Medicine

## 2020-04-09 ENCOUNTER — Ambulatory Visit
Admission: RE | Admit: 2020-04-09 | Discharge: 2020-04-09 | Disposition: A | Discharge status: Home / Self Care | Payer: Medicare Other | Source: Ambulatory Visit | Attending: Neurology | Admitting: Neurology

## 2020-04-09 DIAGNOSIS — M170 Bilateral primary osteoarthritis of knee: Secondary | ICD-10-CM

## 2020-04-09 DIAGNOSIS — R413 Other amnesia: Secondary | ICD-10-CM

## 2020-04-09 NOTE — Progress Notes (Signed)
[name] [place] [facility] Phone: [phone] Subjective:   Heather [name], am serving as a scribe for Dr. Hulan Saas.  This visit occurred during the SARS-CoV-2 public health emergency.  Safety protocols were in place, including screening questions prior to the visit, additional usage of staff PPE, and extensive cleaning of exam room while observing appropriate contact time as indicated for disinfecting solutions.   I'm seeing this patient by the request  of:  McLean-Scocuzza, [name], MD  CC: Bilateral knee pain  PJP:[id number]   02/07/2020 Bilateral injections given again today.  Tolerated the procedure well.  I do believe that with patient history that I do think she needs to consider the possibility of surgical intervention.  Patient has done remarkably well and has a BMI under 35 at this time.  Patient will be referred to orthopedic surgery to discuss the potential for surgical intervention.  Otherwise we only can repeat injections every 10 to 12 weeks.  Chronic problem with exacerbation.  Follow-up with me again in 3 months  Update 04/09/2020 Heather [name] is a 75 y.o. female coming in with complaint of bilateral knee pain. Patient did meet with Dr. Mayer Camel who recommended gel injections for her knees. Patient states that for the last week her knees have been hurting very bad to where it is hard to stand or sit. States that she did notice that the last cortisone injection did last longer this past time, but does want to talk about the Gel injections. Patient had an MRI today and had a anxiety pill so she is a little woozy.  Patient states that has had difficulty going up stairs significantly at this time for the knees.  Patient is putting in an elevator.      Past Medical History:  Diagnosis Date  . Abdominal hernia   . Abnormal Pap smear   . ALLERGIC RHINITIS 10/22/2006  . Allergy   . ANEMIA 12/18/2008  . Arthritis     scoliosis moderate deg changes lumbar Xray 11/04/17   . ASYMPTOMATIC POSTMENOPAUSAL STATUS 11/22/2007  . Back pain   . CAD (coronary artery disease)    in LAD  . Complication of anesthesia   . Degenerative arthritis   . DIABETES MELLITUS, TYPE II 01/13/2007  . Diverticulosis   . Dyslipidemia   . Fibroid   . Frequent headaches   . GERD 07/20/2007  . GOITER, MULTINODULAR 07/20/2007  . Headache(784.0) 07/20/2007  . HEARING LOSS 11/22/2007  . Hiatal hernia   . Hiatal hernia    large  . History of chicken pox   . Hx of colposcopy with cervical biopsy   . HYPERCHOLESTEROLEMIA 01/13/2007  . Hyperglycemia   . HYPERTENSION 10/22/2006  . Lung nodule    unchanged since 05/26/13   . Migraines   . NASH (nonalcoholic steatohepatitis)   . Nocturnal hypoxemia 02/17/2013  . Obesity   . Obstructive sleep apnea   . OSA on CPAP    per neurology  . OSTEOARTHRITIS 10/22/2006  . Other chronic nonalcoholic liver disease 09/20/5748  . Sedimentation rate elevation   . Sleep apnea    on cpap  . Urine incontinence   . UTI (urinary tract infection)    Past Surgical History:  Procedure Laterality Date  . BREAST SURGERY  1984   Breast reduction b/l   . CATARACT EXTRACTION, BILATERAL    . DEXA  08/2005  . DILATION AND CURETTAGE OF UTERUS    .  ELECTROCARDIOGRAM  10/15/2006  . ESOPHAGOGASTRODUODENOSCOPY  12/08/2005  . FOOT SURGERY     hammertoe and bunion 05/2017   . Stress Cardiolite  10/21/2005  . sweat gland removal    . WISDOM TOOTH EXTRACTION     Social History   Socioeconomic History  . Marital status: Married    Spouse name: [name]  . Number of children: 2  . Years of education: College  . Highest education level: Not on file  Occupational History  . Occupation: Retired  Tobacco Use  . Smoking status: Former Smoker    Packs/day: 2.00    Years: 20.00    Pack years: 40.00    Types: Cigarettes    Quit date: 03/03/1979    Years since quitting: 41.1  . Smokeless tobacco: Never Used   Substance and Sexual Activity  . Alcohol use: Yes    Comment: rare  . Drug use: No  . Sexual activity: Yes    Birth control/protection: Surgical  Other Topics Concern  . Not on file  Social History Narrative   Patient is married [name]).   Right Handed   Drinks 1 cup caffeine   Social Determinants of Health   Financial Resource Strain: Low Risk   . Difficulty of Paying Living Expenses: Not hard at all  Food Insecurity: No Food Insecurity  . Worried About Charity fundraiser in the Last Year: Never true  . Ran Out of Food in the Last Year: Never true  Transportation Needs: No Transportation Needs  . Lack of Transportation (Medical): No  . Lack of Transportation (Non-Medical): No  Physical Activity: Not on file  Stress: No Stress Concern Present  . Feeling of Stress : Not at all  Social Connections: Unknown  . Frequency of Communication with Friends and Family: Not on file  . Frequency of Social Gatherings with Friends and Family: Not on file  . Attends Religious Services: Not on file  . Active Member of Clubs or Organizations: Not on file  . Attends Archivist Meetings: Not on file  . Marital Status: Married   Allergies  Allergen Reactions  . Aspirin   . Coconut Oil   . Lisinopril     REACTION: cough  . Metoprolol Itching  . Peanut-Containing Drug Products   . Penicillins     REACTION: rash  . Prednisone   . Strawberry Extract    Family History  Problem Relation Age of Onset  . Asthma Mother   . Depression Mother   . Bipolar disorder Mother   . Dementia Mother   . Arthritis Mother   . Breast cancer Mother        primary  . Colon cancer Mother        mets from breast  . Cancer Mother        colon  . Hypertension Father   . Migraines Father   . Cancer Maternal Aunt        ?  [name] Heart disease Maternal Grandmother   . Cancer Maternal Grandfather        stomach ?     Current Outpatient Medications (Cardiovascular):  .  furosemide (LASIX)  40 MG tablet, Take 1 tablet (40 mg total) by mouth daily as needed. .  rosuvastatin (CRESTOR) 40 MG tablet, Take 1 tablet (40 mg total) by mouth daily. At night  Current Outpatient Medications (Respiratory):  .  cetirizine (ZYRTEC) 10 MG tablet, Take 10 mg by mouth daily.  Current Outpatient Medications (Analgesics):  .  acetaminophen (TYLENOL) 500 MG tablet, Take 650 mg by mouth 3 (three) times daily as needed for pain.  .  traMADol (ULTRAM) 50 MG tablet, Take 50 mg by mouth daily.  Current Outpatient Medications (Hematological):  .  clopidogrel (PLAVIX) 75 MG tablet, Take 1 tablet (75 mg total) by mouth daily. Needs appt for future refills .  Cyanocobalamin 1000 MCG CAPS, Take 1 capsule by mouth daily. .  Ferrous Sulfate (IRON) 325 (65 FE) MG TABS, Take 1 tablet by mouth daily.  Current Outpatient Medications (Other):  [name]  ALPRAZolam (XANAX) 0.5 MG tablet, Take 2 tablets approximately 45 minutes prior to the MRI study, take a third tablet if needed. .  Calcium Carbonate-Vitamin D (CALCIUM 600 + D PO), Take 2 tablets by mouth daily. .  diclofenac Sodium (VOLTAREN) 1 % GEL, Apply topically 4 (four) times daily. [name]  gabapentin (NEURONTIN) 100 MG capsule, Take 3 pills at bedtime .  glucosamine-chondroitin 500-400 MG tablet, Take 1 tablet by mouth 2 (two) times daily. .  Menthol, Topical Analgesic, (BIOFREEZE COLORLESS) 4 % GEL, Apply topically. .  Multiple Vitamin (MULTIVITAMIN) tablet, Take 1 tablet by mouth daily. .  Omega-3 Fatty Acids (FISH OIL) 1000 MG CAPS, Take 1 capsule by mouth daily. .  pantoprazole (PROTONIX) 20 MG tablet, Take 1 tablet (20 mg total) by mouth daily. 30 minutes before food .  polyethylene glycol (MIRALAX / GLYCOLAX) 17 g packet, Take 17 g by mouth daily as needed. .  potassium chloride SA (KLOR-CON M20) 20 MEQ tablet, Take 1 tablet (20 mEq total) by mouth daily. [name]  pyridOXINE (VITAMIN B-6) 100 MG tablet, Take 200 mg by mouth daily. .  SF 5000 PLUS 1.1 % CREA dental  cream,  .  topiramate (TOPAMAX) 25 MG tablet, Take 3 tablets (75 mg total) by mouth at bedtime. .  Turmeric 500 MG TABS, Take 2 tablets by mouth daily. [name]  UNABLE TO FIND, Instaflex for knees .  Vitamin D, Ergocalciferol, (DRISDOL) 1.25 MG (50000 UNIT) CAPS capsule, Take 1 capsule (50,000 Units total) by mouth every 7 (seven) days. .  vitamin E 200 UNIT capsule, Take 200 Units by mouth daily.   Reviewed prior external information including notes and imaging from  primary care provider As well as notes that were available from care everywhere and other healthcare systems.  Past medical history, social, surgical and family history all reviewed in electronic medical record.  No pertanent information unless stated regarding to the chief complaint.   Review of Systems:  No headache, visual changes, nausea, vomiting, diarrhea, constipation, dizziness, abdominal pain, skin rash, fevers, chills, night sweats, weight loss, swollen lymph nodes,  chest pain, shortness of breath, mood changes. POSITIVE muscle aches, body aches and joint swelling  Objective  Blood pressure 100/60, pulse 81, height 5' 3.5" (1.613 m), SpO2 98 %.   General: No apparent distress alert and oriented x3 mood and affect normal, dressed appropriately.  Patient does appear to be a minorly woozy but is alert and oriented. HEENT: Pupils equal, extraocular movements intact  Respiratory: Patient's speak in full sentences and does not appear short of breath  Cardiovascular: Trace lower extremity edema, non tender, no erythema  Gait antalgic gait MSK: Knee: Bilateral valgus deformity noted.  Abnormal thigh to calf ratio.  Tender to palpation over medial and PF joint line.  ROM moderately limited lacking the last 5 degrees of extension in the last 15 degrees of flexion instability with valgus force.  painful patellar compression. Patellar glide  with moderate crepitus. Patellar and quadriceps tendons unremarkable. Hamstring and  quadriceps strength is normal.  After informed written and verbal consent, patient was seated on exam table. Right knee was prepped with alcohol swab and utilizing anterolateral approach, patient's right knee space was injected with 4:1  marcaine 0.5%: Kenalog 69m/dL. Patient tolerated the procedure well without immediate complications.  After informed written and verbal consent, patient was seated on exam table. Left knee was prepped with alcohol swab and utilizing anterolateral approach, patient's left knee space was injected with 4:1  marcaine 0.5%: Kenalog 458mdL. Patient tolerated the procedure well without immediate complications.    Impression and Recommendations:     The above documentation has been reviewed and is accurate and complete ZaLyndal PulleyDO

## 2020-04-09 NOTE — Patient Instructions (Addendum)
Good to see you  Injections given today See me again in 5 weeks for Gel injections

## 2020-04-09 NOTE — Assessment & Plan Note (Signed)
Patient is 9 weeks out from the steroid.  Given injections again today.  Patient does have Plavix on board.  No bleeding noted.  Patient did talk to the orthopedic surgeon and they are going to hold out on a longer time until patient is ready to have surgery.  Discussed viscosupplementation and we have gotten approval already at this time.  Patient doing needed relief now so we did the steroid today but will at follow-up consider the viscosupplementation.  Encourage patient to continue to work on her weight and continue the exercises.

## 2020-04-11 ENCOUNTER — Telehealth: Payer: Self-pay | Admitting: Neurology

## 2020-04-11 NOTE — Telephone Encounter (Signed)
I called the patient.  MRI of the brain was unremarkable.  We will follow the memory issues over time.   MRI brain 04/11/20:  IMPRESSION:   Unremarkable MRI brain without contrast.  No acute findings.

## 2020-04-15 ENCOUNTER — Encounter (INDEPENDENT_AMBULATORY_CARE_PROVIDER_SITE_OTHER): Payer: Self-pay | Admitting: Family Medicine

## 2020-04-15 ENCOUNTER — Other Ambulatory Visit: Payer: Self-pay

## 2020-04-15 ENCOUNTER — Ambulatory Visit (INDEPENDENT_AMBULATORY_CARE_PROVIDER_SITE_OTHER): Payer: Medicare Other | Admitting: Family Medicine

## 2020-04-15 VITALS — BP 145/74 | HR 70 | Temp 98.8°F | Ht 65.0 in | Wt 211.0 lb

## 2020-04-15 DIAGNOSIS — Z6835 Body mass index (BMI) 35.0-35.9, adult: Secondary | ICD-10-CM

## 2020-04-15 DIAGNOSIS — E559 Vitamin D deficiency, unspecified: Secondary | ICD-10-CM

## 2020-04-15 DIAGNOSIS — M170 Bilateral primary osteoarthritis of knee: Secondary | ICD-10-CM

## 2020-04-15 MED ORDER — VITAMIN D (ERGOCALCIFEROL) 1.25 MG (50000 UNIT) PO CAPS: 50000.0000 [IU] | ORAL_CAPSULE | ORAL | 0 refills | Status: DC

## 2020-04-16 ENCOUNTER — Ambulatory Visit: Payer: Medicare Other | Admitting: Family Medicine

## 2020-04-17 ENCOUNTER — Encounter (INDEPENDENT_AMBULATORY_CARE_PROVIDER_SITE_OTHER): Payer: Self-pay | Admitting: Family Medicine

## 2020-04-17 NOTE — Progress Notes (Signed)
Chief Complaint:   OBESITY Heather [name] is here to discuss her progress with her obesity treatment plan along with follow-up of her obesity related diagnoses. Heather [name] is on the Category 2 Plan or keeping a food journal and adhering to recommended goals of 1200 calories and 75 grams of protein daily and states she is following her eating plan approximately 10% of the time. Heather [name] states she is doing 0 minutes 0 times per week.  Today's visit was #: 69 Starting weight: 268 lbs Starting date: 12/23/2016 Today's weight: 211 lbs Today's date: 04/15/2020 Total lbs lost to date: 101 Total lbs lost since last in-office visit: 0  Interim History: Heather [name] notes she has been having severe bilateral knee pain and this has caused her to lose focus on her eating plan. She is trying to put off the total knee replacement. She has been off the plan recently. She also notes some stress eating. She is up 5 lbs since her last in office visit on 02/20/2020. Her knee pain has improved since knee injection last week.  Subjective:   1. Vitamin D deficiency Heather [name]'s last Vit D level was 42. She is on prescription Vit D.  2. Osteoarthritis of both knees, unspecified osteoarthritis type Heather [name] had recent bout of severe osteoarthritis pain. She has recent knee injections which helped tremendously. Ortho wants her to hold off until age 74 to have  total knee replacement.  Assessment/Plan:   1. Vitamin D deficiency We will refill prescription Vitamin D for 1 month, and we will recheck labs at her next office visit. Heather [name] will follow-up for routine testing of Vitamin D, at least 2-3 times per year to avoid over-replacement.  - Vitamin D, Ergocalciferol, (DRISDOL) 1.25 MG (50000 UNIT) CAPS capsule; Take 1 capsule (50,000 Units total) by mouth every 7 (seven) days.  Dispense: 4 capsule; Refill: 0  2. Osteoarthritis of both knees, unspecified osteoarthritis type Heather [name] will continue to follow up with Orthopedics.  3. Class 2  severe obesity with serious comorbidity and body mass index (BMI) of 35.0 to 35.9 in adult, unspecified obesity type (Heather [name]) Heather [name] is currently in the action stage of change. As such, her goal is to continue with weight loss efforts. She has agreed to the Category 2 Plan or keeping a food journal and adhering to recommended goals of 1200 calories and 75+ grams of protein daily.   Handouts: Recipes   We will recheck fasting labs at her next office visit.  Exercise goals: No exercise has been prescribed at this time.  Behavioral modification strategies: increasing lean protein intake and decreasing simple carbohydrates.  Heather [name] has agreed to follow-up with our clinic in 2 to 3 weeks.   Objective:   Blood pressure (!) 145/74, pulse 70, temperature 98.8 F (37.1 C), height 5' 5"  (1.651 m), weight 211 lb (95.7 kg), SpO2 99 %. Body mass index is 35.11 kg/m.  General: Cooperative, alert, well developed, in no acute distress. HEENT: Conjunctivae and lids unremarkable. Cardiovascular: Regular rhythm.  Lungs: Normal work of breathing. Neurologic: No focal deficits.   Lab Results  Component Value Date   CREATININE 0.80 12/07/2019   BUN 26 (H) 12/07/2019   NA 139 12/07/2019   K 4.1 12/07/2019   CL 112 (H) 12/07/2019   CO2 25 12/07/2019   Lab Results  Component Value Date   ALT 20 12/07/2019   AST 19 12/07/2019   ALKPHOS 61 12/07/2019   BILITOT 0.3 12/07/2019   Lab Results  Component Value Date  HGBA1C 5.6 10/09/2019   HGBA1C 5.8 (H) 07/05/2019   HGBA1C 5.6 12/05/2018   HGBA1C 5.6 04/27/2018   HGBA1C 6.0 (H) 12/01/2017   Lab Results  Component Value Date   INSULIN 4.3 10/09/2019   INSULIN 8.6 07/05/2019   INSULIN 6.4 12/05/2018   INSULIN 3.6 04/27/2018   INSULIN 5.6 12/01/2017   Lab Results  Component Value Date   TSH 0.93 04/27/2019   Lab Results  Component Value Date   CHOL 186 07/05/2019   HDL 74 07/05/2019   LDLCALC 100 (H) 07/05/2019   TRIG 64 07/05/2019    CHOLHDL 3 03/05/2016   Lab Results  Component Value Date   WBC 6.3 12/07/2019   HGB 12.7 12/07/2019   HCT 40.1 12/07/2019   MCV 97.6 12/07/2019   PLT 203 12/07/2019   Lab Results  Component Value Date   IRON 91 12/07/2019   TIBC 255 12/07/2019   FERRITIN 147 12/07/2019    Obesity Behavioral Intervention:   Approximately 15 minutes were spent on the discussion below.  ASK: We discussed the diagnosis of obesity with Heather [name] today and Heather [name] agreed to give [name] permission to discuss obesity behavioral modification therapy today.  ASSESS: Heather [name] has the diagnosis of obesity and her BMI today is 35.11. Heather [name] is in the action stage of change.   ADVISE: Heather [name] was educated on the multiple health risks of obesity as well as the benefit of weight loss to improve her health. She was advised of the need for long term treatment and the importance of lifestyle modifications to improve her current health and to decrease her risk of future health problems.  AGREE: Multiple dietary modification options and treatment options were discussed and Heather [name] agreed to follow the recommendations documented in the above note.  ARRANGE: Heather [name] was educated on the importance of frequent visits to treat obesity as outlined per CMS and USPSTF guidelines and agreed to schedule her next follow up appointment today.  Attestation Statements:   Reviewed by clinician on day of visit: allergies, medications, problem list, medical history, surgical history, family history, social history, and previous encounter notes.   Wilhemena Durie, am acting as Location manager for [employer], FNP-C.  I have reviewed the above documentation for accuracy and completeness, and I agree with the above. -  Georgianne Fick, FNP

## 2020-04-18 ENCOUNTER — Other Ambulatory Visit: Payer: Self-pay | Admitting: Internal Medicine

## 2020-04-18 DIAGNOSIS — R609 Edema, unspecified: Secondary | ICD-10-CM

## 2020-04-18 DIAGNOSIS — K219 Gastro-esophageal reflux disease without esophagitis: Secondary | ICD-10-CM

## 2020-04-30 ENCOUNTER — Ambulatory Visit (INDEPENDENT_AMBULATORY_CARE_PROVIDER_SITE_OTHER): Payer: Medicare Other | Admitting: Family Medicine

## 2020-04-30 ENCOUNTER — Other Ambulatory Visit: Payer: Self-pay

## 2020-04-30 ENCOUNTER — Encounter (INDEPENDENT_AMBULATORY_CARE_PROVIDER_SITE_OTHER): Payer: Self-pay | Admitting: Family Medicine

## 2020-04-30 VITALS — BP 112/70 | HR 60 | Temp 98.6°F | Ht 65.0 in | Wt 207.0 lb

## 2020-04-30 DIAGNOSIS — Z6834 Body mass index (BMI) 34.0-34.9, adult: Secondary | ICD-10-CM

## 2020-04-30 DIAGNOSIS — E559 Vitamin D deficiency, unspecified: Secondary | ICD-10-CM | POA: Diagnosis not present

## 2020-04-30 DIAGNOSIS — K5900 Constipation, unspecified: Secondary | ICD-10-CM

## 2020-04-30 DIAGNOSIS — E785 Hyperlipidemia, unspecified: Secondary | ICD-10-CM | POA: Diagnosis not present

## 2020-04-30 DIAGNOSIS — E669 Obesity, unspecified: Secondary | ICD-10-CM

## 2020-04-30 DIAGNOSIS — E1169 Type 2 diabetes mellitus with other specified complication: Secondary | ICD-10-CM | POA: Diagnosis not present

## 2020-04-30 MED ORDER — VITAMIN D (ERGOCALCIFEROL) 1.25 MG (50000 UNIT) PO CAPS: 50000.0000 [IU] | ORAL_CAPSULE | ORAL | 0 refills | Status: DC

## 2020-05-01 ENCOUNTER — Encounter (INDEPENDENT_AMBULATORY_CARE_PROVIDER_SITE_OTHER): Payer: Self-pay | Admitting: Family Medicine

## 2020-05-01 DIAGNOSIS — E785 Hyperlipidemia, unspecified: Secondary | ICD-10-CM | POA: Insufficient documentation

## 2020-05-01 DIAGNOSIS — E1169 Type 2 diabetes mellitus with other specified complication: Secondary | ICD-10-CM | POA: Insufficient documentation

## 2020-05-01 LAB — COMPREHENSIVE METABOLIC PANEL
ALT: 12 IU/L (ref 0–32)
AST: 18 IU/L (ref 0–40)
Albumin/Globulin Ratio: 1.4 (ref 1.2–2.2)
Albumin: 4 g/dL (ref 3.7–4.7)
Alkaline Phosphatase: 64 IU/L (ref 44–121)
BUN/Creatinine Ratio: 24 (ref 12–28)
BUN: 17 mg/dL (ref 8–27)
Bilirubin Total: 0.2 mg/dL (ref 0.0–1.2)
CO2: 21 mmol/L (ref 20–29)
Calcium: 9 mg/dL (ref 8.7–10.3)
Chloride: 106 mmol/L (ref 96–106)
Creatinine, Ser: 0.7 mg/dL (ref 0.57–1.00)
Globulin, Total: 2.8 g/dL (ref 1.5–4.5)
Glucose: 80 mg/dL (ref 65–99)
Potassium: 3.9 mmol/L (ref 3.5–5.2)
Sodium: 141 mmol/L (ref 134–144)
Total Protein: 6.8 g/dL (ref 6.0–8.5)
eGFR: 91 mL/min/{1.73_m2} (ref >59)

## 2020-05-01 LAB — LIPID PANEL WITH LDL/HDL RATIO
Cholesterol, Total: 184 mg/dL (ref 100–199)
HDL: 68 mg/dL (ref >39)
LDL Chol Calc (NIH): 106 mg/dL — ABNORMAL HIGH (ref 0–99)
LDL/HDL Ratio: 1.6 ratio (ref 0.0–3.2)
Triglycerides: 54 mg/dL (ref 0–149)
VLDL Cholesterol Cal: 10 mg/dL (ref 5–40)

## 2020-05-01 LAB — HEMOGLOBIN A1C
Est. average glucose Bld gHb Est-mCnc: 120 mg/dL
Hgb A1c MFr Bld: 5.8 % — ABNORMAL HIGH (ref 4.8–5.6)

## 2020-05-01 LAB — VITAMIN D 25 HYDROXY (VIT D DEFICIENCY, FRACTURES): Vit D, 25-Hydroxy: 58.1 ng/mL (ref 30.0–100.0)

## 2020-05-01 NOTE — Progress Notes (Signed)
Chief Complaint:   OBESITY Heather [name] is here to discuss her progress with her obesity treatment plan along with follow-up of her obesity related diagnoses. Heather [name] is on keeping a food journal and adhering to recommended goals of 1200 calories and 75 g protein and states she is following her eating plan approximately 80% of the time. Heather [name] states she is cardio 30 minutes 3 times per week.  Today's visit was #: 63 Starting weight: 268 lbs Starting date: 12/23/2016 Today's weight: 207 lbs Today's date: 04/30/2020 Total lbs lost to date: 61 lbs Total lbs lost since last in-office visit: 4 lbs  Interim History: Heather [name] has been trying some recipes off [email or web address] and really likes them. She is journaling using [employer]. She is enjoying choosing her own foods with journaling. Her hunger is satisfied mostly.  Subjective:   1. Hyperlipidemia associated with type 2 diabetes mellitus (Heather [name]) Heather [name] is on Crestor. Her LDL is nearly at goal (100). Her HDL and triglycerides are within normal limits.  2. Vitamin D deficiency Heather [name]'s Vitamin D level was 42.0 on 10/09/2019. She is currently taking prescription vitamin D 50,000 IU each week. .   Ref. Range 10/09/2019 14:25  Vitamin D, 25-Hydroxy Latest Ref Range: 30.0 - 100.0 ng/mL 42.0   3. Type 2 diabetes mellitus with other specified complication, without long-term current use of insulin (Heather [name]) Heather [name] is not on medication for type 2 diabetes. DM is well controlled. Her last A1c was 5.6.  4. Constipation, unspecified constipation type Heather [name] notes constipation. She has been taking Miralax sporadically. She notes eating cabbage has helped quite a bit.   Assessment/Plan:   1. Hyperlipidemia associated with type 2 diabetes mellitus (Heather [name])  Continue Crestor. Check labs today. - Lipid Panel With LDL/HDL Ratio  2. Vitamin D deficiency . She agrees to continue to take prescription Vitamin D @50 ,000 IU every week and will follow-up for routine testing  of Vitamin D, at least 2-3 times per year to avoid over-replacement. Check labs today.  - VITAMIN D 25 Hydroxy (Vit-D Deficiency, Fractures)  - Vitamin D, Ergocalciferol, (DRISDOL) 1.25 MG (50000 UNIT) CAPS capsule; Take 1 capsule (50,000 Units total) by mouth every 7 (seven) days.  Dispense: 4 capsule; Refill: 0  3. Type 2 diabetes mellitus with other specified complication, without long-term current use of insulin (Heather [name])  Check labs today.  - Comprehensive metabolic panel - Hemoglobin A1c  4. Constipation, unspecified constipation type Take Miralax as needed. Continue to increase vegetable intake.  5. Class 1 obesity with serious comorbidity and body mass index (BMI) of 34.0 to 34.9 in adult, unspecified obesity type Heather [name] is currently in the action stage of change. As such, her goal is to continue with weight loss efforts. She has agreed to keeping a food journal and adhering to recommended goals of 1200 calories and 75 g protein.   Exercise goals: As is  Behavioral modification strategies: planning for success.  Heather [name] has agreed to follow-up with our clinic in 2-3 weeks.   Objective:   Blood pressure 112/70, pulse 60, temperature 98.6 F (37 C), temperature source Oral, height 5' 5"  (1.651 m), weight 207 lb (93.9 kg), SpO2 98 %. Body mass index is 34.45 kg/m.  General: Cooperative, alert, well developed, in no acute distress. HEENT: Conjunctivae and lids unremarkable. Cardiovascular: Regular rhythm.  Lungs: Normal work of breathing. Neurologic: No focal deficits.   Lab Results  Component Value Date   CREATININE 0.80 12/07/2019   BUN 26 (H) 12/07/2019  NA 139 12/07/2019   K 4.1 12/07/2019   CL 112 (H) 12/07/2019   CO2 25 12/07/2019   Lab Results  Component Value Date   ALT 20 12/07/2019   AST 19 12/07/2019   ALKPHOS 61 12/07/2019   BILITOT 0.3 12/07/2019   Lab Results  Component Value Date   HGBA1C 5.6 10/09/2019   HGBA1C 5.8 (H) 07/05/2019   HGBA1C 5.6  12/05/2018   HGBA1C 5.6 04/27/2018   HGBA1C 6.0 (H) 12/01/2017   Lab Results  Component Value Date   INSULIN 4.3 10/09/2019   INSULIN 8.6 07/05/2019   INSULIN 6.4 12/05/2018   INSULIN 3.6 04/27/2018   INSULIN 5.6 12/01/2017   Lab Results  Component Value Date   TSH 0.93 04/27/2019   Lab Results  Component Value Date   CHOL 186 07/05/2019   HDL 74 07/05/2019   LDLCALC 100 (H) 07/05/2019   TRIG 64 07/05/2019   CHOLHDL 3 03/05/2016   Lab Results  Component Value Date   WBC 6.3 12/07/2019   HGB 12.7 12/07/2019   HCT 40.1 12/07/2019   MCV 97.6 12/07/2019   PLT 203 12/07/2019   Lab Results  Component Value Date   IRON 91 12/07/2019   TIBC 255 12/07/2019   FERRITIN 147 12/07/2019    Obesity Behavioral Intervention:   Approximately 15 minutes were spent on the discussion below.  ASK: We discussed the diagnosis of obesity with Daneli today and Demiya agreed to give [name] permission to discuss obesity behavioral modification therapy today.  ASSESS: Nikitha has the diagnosis of obesity and her BMI today is 34.5. Samaiyah is in the action stage of change.   ADVISE: Darlyne was educated on the multiple health risks of obesity as well as the benefit of weight loss to improve her health. She was advised of the need for long term treatment and the importance of lifestyle modifications to improve her current health and to decrease her risk of future health problems.  AGREE: Multiple dietary modification options and treatment options were discussed and Landyn agreed to follow the recommendations documented in the above note.  ARRANGE: Terryann was educated on the importance of frequent visits to treat obesity as outlined per CMS and USPSTF guidelines and agreed to schedule her next follow up appointment today.  Attestation Statements:   Reviewed by clinician on day of visit: allergies, medications, problem list, medical history, surgical history, family history, social history, and previous  encounter notes.  Coral Ceo, am acting as Location manager for [employer], [place].  I have reviewed the above documentation for accuracy and completeness, and I agree with the above. -  Georgianne Fick, FNP

## 2020-05-08 ENCOUNTER — Ambulatory Visit (HOSPITAL_COMMUNITY)
Admission: RE | Admit: 2020-05-08 | Discharge: 2020-05-08 | Disposition: A | Discharge status: Home / Self Care | Payer: Medicare Other | Source: Ambulatory Visit | Attending: Cardiology | Admitting: Cardiology

## 2020-05-08 ENCOUNTER — Other Ambulatory Visit: Payer: Self-pay

## 2020-05-08 ENCOUNTER — Encounter (HOSPITAL_COMMUNITY): Payer: Self-pay | Admitting: Cardiology

## 2020-05-08 VITALS — BP 102/64 | HR 61 | Wt 210.6 lb

## 2020-05-08 DIAGNOSIS — Z79899 Other long term (current) drug therapy: Secondary | ICD-10-CM | POA: Insufficient documentation

## 2020-05-08 DIAGNOSIS — Z791 Long term (current) use of non-steroidal anti-inflammatories (NSAID): Secondary | ICD-10-CM | POA: Diagnosis not present

## 2020-05-08 DIAGNOSIS — I10 Essential (primary) hypertension: Secondary | ICD-10-CM | POA: Diagnosis not present

## 2020-05-08 DIAGNOSIS — G4733 Obstructive sleep apnea (adult) (pediatric): Secondary | ICD-10-CM | POA: Insufficient documentation

## 2020-05-08 DIAGNOSIS — E785 Hyperlipidemia, unspecified: Secondary | ICD-10-CM | POA: Insufficient documentation

## 2020-05-08 DIAGNOSIS — M25561 Pain in right knee: Secondary | ICD-10-CM | POA: Diagnosis not present

## 2020-05-08 DIAGNOSIS — I2510 Atherosclerotic heart disease of native coronary artery without angina pectoris: Secondary | ICD-10-CM

## 2020-05-08 DIAGNOSIS — Z87891 Personal history of nicotine dependence: Secondary | ICD-10-CM | POA: Diagnosis not present

## 2020-05-08 DIAGNOSIS — Z886 Allergy status to analgesic agent status: Secondary | ICD-10-CM | POA: Insufficient documentation

## 2020-05-08 DIAGNOSIS — Z7902 Long term (current) use of antithrombotics/antiplatelets: Secondary | ICD-10-CM | POA: Insufficient documentation

## 2020-05-08 DIAGNOSIS — M25562 Pain in left knee: Secondary | ICD-10-CM | POA: Insufficient documentation

## 2020-05-08 DIAGNOSIS — E669 Obesity, unspecified: Secondary | ICD-10-CM | POA: Diagnosis not present

## 2020-05-08 DIAGNOSIS — Z6835 Body mass index (BMI) 35.0-35.9, adult: Secondary | ICD-10-CM | POA: Insufficient documentation

## 2020-05-08 DIAGNOSIS — E119 Type 2 diabetes mellitus without complications: Secondary | ICD-10-CM | POA: Insufficient documentation

## 2020-05-08 HISTORY — DX: Heart failure, unspecified: I50.9

## 2020-05-08 MED ORDER — EZETIMIBE 10 MG PO TABS: 10.0000 mg | ORAL_TABLET | Freq: Every day | ORAL | 3 refills | Status: DC

## 2020-05-08 NOTE — Progress Notes (Signed)
[facility] Phone: [phone] Subjective:   [name], am serving as a scribe for Dr. Hulan Saas. This visit occurred during the SARS-CoV-2 public health emergency.  Safety protocols were in place, including screening questions prior to the visit, additional usage of staff PPE, and extensive cleaning of exam room while observing appropriate contact time as indicated for disinfecting solutions.   I'm seeing this patient by the request  of:  McLean-Scocuzza, [name], MD  CC: Bilateral knee pain  [id number]   04/09/2020 Patient is 9 weeks out from the steroid.  Given injections again today.  Patient does have Plavix on board.  No bleeding noted.  Patient did talk to the orthopedic surgeon and they are going to hold out on a longer time until patient is ready to have surgery.  Discussed viscosupplementation and we have gotten approval already at this time.  Patient doing needed relief now so we did the steroid today but will at follow-up consider the viscosupplementation.  Encourage patient to continue to work on her weight and continue the exercises.   Update 05/09/2020 ZANIYA MCAULAY is a 75 y.o. female coming in with complaint of bilateral knee pain. Patient states that she is doing well since last visit. Patient was measured and fitted for an off-the-shelf brace. Adjustments were made to brace to ensure proper fit.  Patient did feel that the steroid injection helped somewhat but patient is here for more viscosupplementation likely.    Past Medical History:  Diagnosis Date  . Abdominal hernia   . Abnormal Pap smear   . ALLERGIC RHINITIS 10/22/2006  . Allergy   . ANEMIA 12/18/2008  . Arthritis    scoliosis moderate deg changes lumbar Xray 11/04/17   . ASYMPTOMATIC POSTMENOPAUSAL STATUS 11/22/2007  . Back pain   . CAD (coronary artery disease)    in LAD  . CHF (congestive heart failure) ([place])   .  Complication of anesthesia   . Degenerative arthritis   . DIABETES MELLITUS, TYPE II 01/13/2007  . Diverticulosis   . Dyslipidemia   . Fibroid   . Frequent headaches   . GERD 07/20/2007  . GOITER, MULTINODULAR 07/20/2007  . Headache(784.0) 07/20/2007  . HEARING LOSS 11/22/2007  . Hiatal hernia   . Hiatal hernia    large  . History of chicken pox   . Hx of colposcopy with cervical biopsy   . HYPERCHOLESTEROLEMIA 01/13/2007  . Hyperglycemia   . HYPERTENSION 10/22/2006  . Lung nodule    unchanged since 05/26/13   . Migraines   . NASH (nonalcoholic steatohepatitis)   . Nocturnal hypoxemia 02/17/2013  . Obesity   . Obstructive sleep apnea   . OSA on CPAP    per neurology  . OSTEOARTHRITIS 10/22/2006  . Other chronic nonalcoholic liver disease 08/20/3084  . Sedimentation rate elevation   . Sleep apnea    on cpap  . Urine incontinence   . UTI (urinary tract infection)    Past Surgical History:  Procedure Laterality Date  . BREAST SURGERY  1984   Breast reduction b/l   . CATARACT EXTRACTION, BILATERAL    . DEXA  08/2005  . DILATION AND CURETTAGE OF UTERUS    . ELECTROCARDIOGRAM  10/15/2006  . ESOPHAGOGASTRODUODENOSCOPY  12/08/2005  . FOOT SURGERY     hammertoe and bunion 05/2017   . Stress Cardiolite  10/21/2005  . sweat gland removal    . WISDOM TOOTH EXTRACTION  Social History   Socioeconomic History  . Marital status: Married    Spouse name: [name]  . Number of children: 2  . Years of education: College  . Highest education level: Not on file  Occupational History  . Occupation: Retired  Tobacco Use  . Smoking status: Former Smoker    Packs/day: 2.00    Years: 20.00    Pack years: 40.00    Types: Cigarettes    Quit date: 03/03/1979    Years since quitting: 41.2  . Smokeless tobacco: Never Used  Substance and Sexual Activity  . Alcohol use: Yes    Comment: rare  . Drug use: No  . Sexual activity: Yes    Birth control/protection: Surgical  Other Topics  Concern  . Not on file  Social History Narrative   Patient is married [name]).   Right Handed   Drinks 1 cup caffeine   Social Determinants of Health   Financial Resource Strain: Low Risk   . Difficulty of Paying Living Expenses: Not hard at all  Food Insecurity: No Food Insecurity  . Worried About Charity fundraiser in the Last Year: Never true  . Ran Out of Food in the Last Year: Never true  Transportation Needs: No Transportation Needs  . Lack of Transportation (Medical): No  . Lack of Transportation (Non-Medical): No  Physical Activity: Not on file  Stress: No Stress Concern Present  . Feeling of Stress : Not at all  Social Connections: Unknown  . Frequency of Communication with Friends and Family: Not on file  . Frequency of Social Gatherings with Friends and Family: Not on file  . Attends Religious Services: Not on file  . Active Member of Clubs or Organizations: Not on file  . Attends Archivist Meetings: Not on file  . Marital Status: Married   Allergies  Allergen Reactions  . Aspirin   . Coconut Oil   . Lisinopril     REACTION: cough  . Metoprolol Itching  . Peanut-Containing Drug Products   . Penicillins     REACTION: rash  . Prednisone   . Strawberry Extract    Family History  Problem Relation Age of Onset  . Asthma Mother   . Depression Mother   . Bipolar disorder Mother   . Dementia Mother   . Arthritis Mother   . Breast cancer Mother        primary  . Colon cancer Mother        mets from breast  . Cancer Mother        colon  . Hypertension Father   . Migraines Father   . Cancer Maternal Aunt        ?  [name] Heart disease Maternal Grandmother   . Cancer Maternal Grandfather        stomach ?     Current Outpatient Medications (Cardiovascular):  .  ezetimibe (ZETIA) 10 MG tablet, Take 1 tablet (10 mg total) by mouth daily. .  furosemide (LASIX) 40 MG tablet, Take 40 mg by mouth daily. .  rosuvastatin (CRESTOR) 40 MG tablet, Take 1  tablet (40 mg total) by mouth daily. At night  Current Outpatient Medications (Respiratory):  .  cetirizine (ZYRTEC) 10 MG tablet, Take 10 mg by mouth daily.  Current Outpatient Medications (Analgesics):  .  acetaminophen (TYLENOL) 500 MG tablet, Take 650 mg by mouth 3 (three) times daily as needed for pain.  .  traMADol (ULTRAM) 50 MG tablet, Take by mouth  every 6 (six) hours as needed.  Current Outpatient Medications (Hematological):  .  clopidogrel (PLAVIX) 75 MG tablet, Take 1 tablet (75 mg total) by mouth daily. Needs appt for future refills .  Cyanocobalamin 1000 MCG CAPS, Take 1 capsule by mouth daily. .  Ferrous Sulfate (IRON) 325 (65 FE) MG TABS, Take 1 tablet by mouth daily.  Current Outpatient Medications (Other):  .  diclofenac Sodium (VOLTAREN) 1 % GEL, Apply topically 4 (four) times daily. [name]  gabapentin (NEURONTIN) 100 MG capsule, Take 3 pills at bedtime .  glucosamine-chondroitin 500-400 MG tablet, Take 1 tablet by mouth 2 (two) times daily. [name]  KLOR-CON M20 20 MEQ tablet, TAKE 1 TABLET DAILY .  Multiple Vitamin (MULTIVITAMIN) tablet, Take 1 tablet by mouth daily. .  Omega-3 Fatty Acids (FISH OIL) 1000 MG CAPS, Take 1 capsule by mouth daily. .  pantoprazole (PROTONIX) 20 MG tablet, TAKE 1 TABLET DAILY 30     MINUTES BEFORE FOOD        (DISCONTINUE DEXILANT,     DISCONTINUE 40MG DOSE) .  polyethylene glycol (MIRALAX / GLYCOLAX) 17 g packet, Take 17 g by mouth daily as needed. .  pyridOXINE (VITAMIN B-6) 100 MG tablet, Take 200 mg by mouth daily. .  SF 5000 PLUS 1.1 % CREA dental cream,  .  topiramate (TOPAMAX) 25 MG tablet, Take 3 tablets (75 mg total) by mouth at bedtime. .  Turmeric 500 MG CAPS, Take 1 capsule by mouth daily. [name]  UNABLE TO FIND, Instaflex for knees .  Vitamin D, Ergocalciferol, (DRISDOL) 1.25 MG (50000 UNIT) CAPS capsule, Take 1 capsule (50,000 Units total) by mouth every 7 (seven) days. .  vitamin E 200 UNIT capsule, Take 200 Units by mouth  daily.   Reviewed prior external information including notes and imaging from  primary care provider As well as notes that were available from care everywhere and other healthcare systems.  Past medical history, social, surgical and family history all reviewed in electronic medical record.  No pertanent information unless stated regarding to the chief complaint.   Review of Systems:  No headache, visual changes, nausea, vomiting, diarrhea, constipation, dizziness, abdominal pain, skin rash, fevers, chills, night sweats, weight loss, swollen lymph nodes,joint swelling, chest pain, shortness of breath, mood changes. POSITIVE muscle aches, body aches  Objective  Blood pressure 118/64, height 5' 5"  (1.651 m), weight 210 lb (95.3 kg).   General: No apparent distress alert and oriented x3 mood and affect normal, dressed appropriately.  HEENT: Pupils equal, extraocular movements intact  Respiratory: Patient's speak in full sentences and does not appear short of breath  Cardiovascular: No lower extremity edema, non tender, no erythema  Gait normal with good balance and coordination.  MSK:   Knee: Bilateral valgus deformity noted. Large thigh to calf ratio.  Tender to palpation over medial and PF joint line.  ROM limited bilaterally instability with valgus force.  painful patellar compression. Patellar glide with moderate crepitus. Patellar and quadriceps tendons unremarkable. Hamstring and quadriceps strength is normal.  After informed written and verbal consent, patient was seated on exam table. Right knee was prepped with alcohol swab and utilizing anterolateral approach, patient's right knee space was injected with 48 mg per 3 mL of Monovisc (sodium hyaluronate) in a prefilled syringe was injected easily into the knee through a 22-gauge needle..Patient tolerated the procedure well without immediate complications.  After informed written and verbal consent, patient was seated on exam  table. Left knee was prepped with alcohol  swab and utilizing anterolateral approach, patient's left knee space was injected with 40 mg per 3 mL of Monovisc (sodium hyaluronate) in a prefilled syringe was injected easily into the knee through a 22-gauge needle..Patient tolerated the procedure well without immediate complications.    Impression and Recommendations:     The above documentation has been reviewed and is accurate and complete Lyndal Pulley, DO

## 2020-05-08 NOTE — Patient Instructions (Addendum)
EKG done today.  No Labs done today.   START Zetia 30m (1 tablet) by mouth daily.  No other medication changes were made. Please continue all current medications as prescribed.  Your physician recommends that you schedule a follow-up appointment in: 2 months for a lab only appointment and in 1 year for an appointment with Dr. MAundra Dubin Please contact our office in February 2023 to schedule a March 2023 appointment.   If you have any questions or concerns before your next appointment please send [place] message through [email or web address] call our office at [phone]    TO LEAVE A MESSAGE FOR THE NURSE SELECT OPTION 2, PLEASE LEAVE A MESSAGE INCLUDING: . YOUR NAME . DATE OF BIRTH . CALL BACK NUMBER . REASON FOR CALL**this is important as we prioritize the call backs  YOU WILL RECEIVE A CALL BACK THE SAME DAY AS LONG AS YOU CALL BEFORE 4:00 PM   Do the following things EVERYDAY: 1) Weigh yourself in the morning before breakfast. Write it down and keep it in a log. 2) Take your medicines as prescribed 3) Eat low salt foods--Limit salt (sodium) to 2000 mg per day.  4) Stay as active as you can everyday 5) Limit all fluids for the day to less than 2 liters   At the [facility] you and your health needs are our priority. As part of our continuing mission to provide you with exceptional heart care, we have created designated Provider Care Teams. These Care Teams include your primary Cardiologist (physician) and Advanced Practice Providers (APPs- Physician Assistants and Nurse Practitioners) who all work together to provide you with the care you need, when you need it.   You may see any of the following providers on your designated Care Team at your next follow up: .Marland KitchenDr DGlori Bickers. Dr DLoralie Champagne. ADarrick Grinder NP . BLyda Jester PA . LAudry Riles PharmD   Please be sure to bring in all your medications bottles to every appointment.

## 2020-05-09 ENCOUNTER — Encounter: Payer: Self-pay | Admitting: Family Medicine

## 2020-05-09 ENCOUNTER — Ambulatory Visit (INDEPENDENT_AMBULATORY_CARE_PROVIDER_SITE_OTHER): Payer: Medicare Other | Admitting: Family Medicine

## 2020-05-09 DIAGNOSIS — M170 Bilateral primary osteoarthritis of knee: Secondary | ICD-10-CM | POA: Diagnosis not present

## 2020-05-09 NOTE — Progress Notes (Signed)
Patient ID: Heather [name], female   DOB: [birth year], 75 y.o.   MRN: [id number] PCP: Dr. Loanne Drilling Cardiology: Dr. Aundra Dubin  75 y.o.with CAD by coronary calcium on CT chest, OSA on Bipap, type II diabetes, and HTN presents for followup of CAD.  Her husband Heather [name]) is a patient of mine as well.  She had a workup for exertional dyspnea in 6/14 by Dr Chase Caller.  PFTs were restrictive with normal DLCO, suggesting abnormality was from body habitus.  CT chest showed coronary calcification.  She had an echo showed normal EF and a Lexiscan Cardiolite in 8/14 that was normal.    Echo in 11/19 showed EF 60-65%, normal RV, PASP 43 mmHg.   Main complaint is knee pain due to arthritis.  She uses her CPAP nightly.  No exertional chest pain or dyspnea though she is not very active due to knee pain.  She cannot take ASA due to hives so is taking Plavix.   ECG (personally reviewed): NSR, normal  Labs (12/15): LDL 64, HDL 50 Labs (6/16): TSH normal, HCT 39.1, K 3.4, creatinine 0.91 Labs (1/18): K 3.5, creatinine 0.87, LDL 78, HDL 65 Labs (10/19): K 3.9, creatinine 0.68, LDL 89, HDL 66, hgb 12.4 Labs (3/22): LDL 106, HDL 68, K 3.9, creatinine 0.7  PMH: 1. Obese 2. OSA: On oxygen and Bipap at night.  3. Migraines 4. GERD with large hiatal hernia 5. Type II diabetes. 6. PFTs in 6/14 with evidence for restriction but normal DLCO.  Suspect related to body habitus.  7. CAD: Coronary calcium on CT chest in 6/14.  Lexiscan Cardiolite in 8/14 with EF 51%, no ischemia or infarction.  Echo (8/14) with EF 60%, mild LVH.  - Echo (11/19): EF 60-65%, normal RV, PASP 43 mmHg.  8. Hyperlipidemia 9. HTN 10. Uterine fibroids 11. Osteoarthritis 12. Aspirin allergy: Hives.   SH: Married, quit smoking in 1980s, 2 children  FH: No premature CAD.  ROS: All systems reviewed and negative except as per HPI.   Current Outpatient Medications  Medication Sig Dispense Refill  . acetaminophen (TYLENOL) 500 MG tablet  Take 650 mg by mouth 3 (three) times daily as needed for pain.     . cetirizine (ZYRTEC) 10 MG tablet Take 10 mg by mouth daily.    . clopidogrel (PLAVIX) 75 MG tablet Take 1 tablet (75 mg total) by mouth daily. Needs appt for future refills 90 tablet 3  . Cyanocobalamin 1000 MCG CAPS Take 1 capsule by mouth daily.    . diclofenac Sodium (VOLTAREN) 1 % GEL Apply topically 4 (four) times daily.    [name] ezetimibe (ZETIA) 10 MG tablet Take 1 tablet (10 mg total) by mouth daily. 90 tablet 3  . Ferrous Sulfate (IRON) 325 (65 FE) MG TABS Take 1 tablet by mouth daily.    . furosemide (LASIX) 40 MG tablet Take 40 mg by mouth daily.    [name] gabapentin (NEURONTIN) 100 MG capsule Take 3 pills at bedtime 270 capsule 3  . glucosamine-chondroitin 500-400 MG tablet Take 1 tablet by mouth 2 (two) times daily.    [name] KLOR-CON M20 20 MEQ tablet TAKE 1 TABLET DAILY 90 tablet 1  . Multiple Vitamin (MULTIVITAMIN) tablet Take 1 tablet by mouth daily.    . Omega-3 Fatty Acids (FISH OIL) 1000 MG CAPS Take 1 capsule by mouth daily.    . pantoprazole (PROTONIX) 20 MG tablet TAKE 1 TABLET DAILY 30     MINUTES BEFORE FOOD        (  DISCONTINUE DEXILANT,     DISCONTINUE 40MG DOSE) 90 tablet 3  . polyethylene glycol (MIRALAX / GLYCOLAX) 17 g packet Take 17 g by mouth daily as needed.    . pyridOXINE (VITAMIN B-6) 100 MG tablet Take 200 mg by mouth daily.    . rosuvastatin (CRESTOR) 40 MG tablet Take 1 tablet (40 mg total) by mouth daily. At night 90 tablet 1  . SF 5000 PLUS 1.1 % CREA dental cream   0  . topiramate (TOPAMAX) 25 MG tablet Take 3 tablets (75 mg total) by mouth at bedtime. 270 tablet 1  . traMADol (ULTRAM) 50 MG tablet Take by mouth every 6 (six) hours as needed.    . Turmeric 500 MG CAPS Take 1 capsule by mouth daily.    [name] UNABLE TO FIND Instaflex for knees    . Vitamin D, Ergocalciferol, (DRISDOL) 1.25 MG (50000 UNIT) CAPS capsule Take 1 capsule (50,000 Units total) by mouth every 7 (seven) days. 4 capsule 0  .  vitamin E 200 UNIT capsule Take 200 Units by mouth daily.     No current facility-administered medications for this encounter.   BP 102/64   Pulse 61   Wt 95.5 kg (210 lb 9.6 oz)   SpO2 97%   BMI 35.05 kg/m  General: NAD Neck: No JVD, no thyromegaly or thyroid nodule.  Lungs: Clear to auscultation bilaterally with normal respiratory effort. CV: Nondisplaced PMI.  Heart regular S1/S2, no S3/S4, no murmur. 1+ ankle edema.  No carotid bruit.  Normal pedal pulses.  Abdomen: Soft, nontender, no hepatosplenomegaly, no distention.  Skin: Intact without lesions or rashes.  Neurologic: Alert and oriented x 3.  Psych: Normal affect. Extremities: No clubbing or cyanosis.  HEENT: Normal.   Assessment/Plan: 1. CAD: Patient has coronary disease based on calcium seen on chest CT in the coronaries in 6/14.  She had a Lexiscan Cardiolite in 8/14 that was normal.  Currently, she is really not limited by dyspnea and has no chest pain.  Main problem is bilateral knee pain with ambulation.   - Continue Plavix 75 daily (ASA allergy).  - Continue statin and ARB.  2. Hyperlipidemia: Goal LDL < 70.  - Continue Crestor 40 mg daily.  - Will add Zetia 10 mg daily given LDL 106.  Check lipids/LFTs in 2 months.  3. HTN: BP is controlled.  4. Obesity: Continue efforts at weight loss.     Followup 1 year.   [name] 05/09/2020

## 2020-05-09 NOTE — Patient Instructions (Signed)
See me back in 2-3 months

## 2020-05-09 NOTE — Assessment & Plan Note (Signed)
Patient given Monovisc injections today.  Tolerated the procedure well.  Chronic problem with exacerbation over the course of time.  He has done this in the past and hopefully that this will make some improvement again.  Patient still wants to hold on any type of surgical intervention if possible.  Follow-up with me again in 2 to 3 months

## 2020-05-21 ENCOUNTER — Ambulatory Visit (INDEPENDENT_AMBULATORY_CARE_PROVIDER_SITE_OTHER): Payer: Medicare Other | Admitting: Family Medicine

## 2020-05-22 ENCOUNTER — Encounter (INDEPENDENT_AMBULATORY_CARE_PROVIDER_SITE_OTHER): Payer: Self-pay | Admitting: Family Medicine

## 2020-05-22 ENCOUNTER — Other Ambulatory Visit: Payer: Self-pay

## 2020-05-22 ENCOUNTER — Ambulatory Visit (INDEPENDENT_AMBULATORY_CARE_PROVIDER_SITE_OTHER): Payer: Medicare Other | Admitting: Family Medicine

## 2020-05-22 VITALS — BP 110/69 | HR 78 | Temp 98.2°F | Ht 65.0 in | Wt 204.0 lb

## 2020-05-22 DIAGNOSIS — Z6841 Body Mass Index (BMI) 40.0 and over, adult: Secondary | ICD-10-CM

## 2020-05-22 DIAGNOSIS — E559 Vitamin D deficiency, unspecified: Secondary | ICD-10-CM

## 2020-05-22 DIAGNOSIS — E7849 Other hyperlipidemia: Secondary | ICD-10-CM

## 2020-05-22 MED ORDER — VITAMIN D (ERGOCALCIFEROL) 1.25 MG (50000 UNIT) PO CAPS: 50000.0000 [IU] | ORAL_CAPSULE | ORAL | 0 refills | Status: DC

## 2020-05-24 ENCOUNTER — Encounter (INDEPENDENT_AMBULATORY_CARE_PROVIDER_SITE_OTHER): Payer: Self-pay | Admitting: Family Medicine

## 2020-05-26 ENCOUNTER — Encounter: Payer: Self-pay | Admitting: Family Medicine

## 2020-05-27 ENCOUNTER — Other Ambulatory Visit (INDEPENDENT_AMBULATORY_CARE_PROVIDER_SITE_OTHER): Payer: Self-pay

## 2020-05-27 NOTE — Progress Notes (Signed)
Chief Complaint:   OBESITY Heather [name] is here to discuss her progress with her obesity treatment plan along with follow-up of her obesity related diagnoses. Heather [name] is on keeping a food journal and adhering to recommended goals of 1200 calories and 75 grams of protein daily and states she is following her eating plan approximately 100% of the time. Heather [name] states she is on the elliptical for 30 minutes 3 times per week.  Today's visit was #: 43 Starting weight: 268 lbs Starting date: 12/23/2016 Today's weight: 204 lbs Today's date: 05/22/2020 Total lbs lost to date: 56 Total lbs lost since last in-office visit: 3  Interim History: Heather [name] has done especially well with weight loss since her last visit. She is doing better with following her plan and did well even with traveling and visiting her daughter. She did the cooking for the family and this helped her stay on track.  Subjective:   1. Vitamin D deficiency Heather [name]'s Vit D level is now at goal.  2. Other hyperlipidemia Heather [name]'s LDL increased slightly. Her Cardiologist restarted her Zetia and she is following up.  Assessment/Plan:   1. Vitamin D deficiency Low Vitamin D level contributes to fatigue and are associated with obesity, breast, and colon cancer. We will refill prescription Vitamin D for 1 month. Heather [name] will follow-up for routine testing of Vitamin D, at least 2-3 times per year to avoid over-replacement.  - Vitamin D, Ergocalciferol, (DRISDOL) 1.25 MG (50000 UNIT) CAPS capsule; Take 1 capsule (50,000 Units total) by mouth every 7 (seven) days.  Dispense: 4 capsule; Refill: 0  2. Other hyperlipidemia Cardiovascular risk and specific lipid/LDL goals reviewed. We discussed several lifestyle modifications today. Heather [name] will continue to work on diet, exercise and weight loss efforts. Orders and follow up as documented in patient record.   3. Obesity with current BMI 34.0 Heather [name] is currently in the action stage of change. As such, her  goal is to continue with weight loss efforts. She has agreed to keeping a food journal and adhering to recommended goals of 1200 calories and 75+ grams of protein daily.   Exercise goals: As is.  Behavioral modification strategies: increasing lean protein intake.  Heather [name] has agreed to follow-up with our clinic in 3 to 4 weeks. She was informed of the importance of frequent follow-up visits to maximize her success with intensive lifestyle modifications for her multiple health conditions.   Objective:   Blood pressure 110/69, pulse 78, temperature 98.2 F (36.8 C), height 5' 5"  (1.651 m), weight 204 lb (92.5 kg), SpO2 99 %. Body mass index is 33.95 kg/m.  General: Cooperative, alert, well developed, in no acute distress. HEENT: Conjunctivae and lids unremarkable. Cardiovascular: Regular rhythm.  Lungs: Normal work of breathing. Neurologic: No focal deficits.   Lab Results  Component Value Date   CREATININE 0.70 04/30/2020   BUN 17 04/30/2020   NA 141 04/30/2020   K 3.9 04/30/2020   CL 106 04/30/2020   CO2 21 04/30/2020   Lab Results  Component Value Date   ALT 12 04/30/2020   AST 18 04/30/2020   ALKPHOS 64 04/30/2020   BILITOT 0.2 04/30/2020   Lab Results  Component Value Date   HGBA1C 5.8 (H) 04/30/2020   HGBA1C 5.6 10/09/2019   HGBA1C 5.8 (H) 07/05/2019   HGBA1C 5.6 12/05/2018   HGBA1C 5.6 04/27/2018   Lab Results  Component Value Date   INSULIN 4.3 10/09/2019   INSULIN 8.6 07/05/2019   INSULIN 6.4 12/05/2018  INSULIN 3.6 04/27/2018   INSULIN 5.6 12/01/2017   Lab Results  Component Value Date   TSH 0.93 04/27/2019   Lab Results  Component Value Date   CHOL 184 04/30/2020   HDL 68 04/30/2020   LDLCALC 106 (H) 04/30/2020   TRIG 54 04/30/2020   CHOLHDL 3 03/05/2016   Lab Results  Component Value Date   WBC 6.3 12/07/2019   HGB 12.7 12/07/2019   HCT 40.1 12/07/2019   MCV 97.6 12/07/2019   PLT 203 12/07/2019   Lab Results  Component Value Date    IRON 91 12/07/2019   TIBC 255 12/07/2019   FERRITIN 147 12/07/2019    Obesity Behavioral Intervention:   Approximately 15 minutes were spent on the discussion below.  ASK: We discussed the diagnosis of obesity with Astha today and Nevena agreed to give [name] permission to discuss obesity behavioral modification therapy today.  ASSESS: Alexas has the diagnosis of obesity and her BMI today is 33.95. Ashantae is in the action stage of change.   ADVISE: Terryann was educated on the multiple health risks of obesity as well as the benefit of weight loss to improve her health. She was advised of the need for long term treatment and the importance of lifestyle modifications to improve her current health and to decrease her risk of future health problems.  AGREE: Multiple dietary modification options and treatment options were discussed and Analeigh agreed to follow the recommendations documented in the above note.  ARRANGE: Jaley was educated on the importance of frequent visits to treat obesity as outlined per CMS and USPSTF guidelines and agreed to schedule her next follow up appointment today.  Attestation Statements:   Reviewed by clinician on day of visit: allergies, medications, problem list, medical history, surgical history, family history, social history, and previous encounter notes.   I, [name], am acting as transcriptionist for [name], MD.  I have reviewed the above documentation for accuracy and completeness, and I agree with the above. -  [name], MD

## 2020-06-19 ENCOUNTER — Ambulatory Visit (INDEPENDENT_AMBULATORY_CARE_PROVIDER_SITE_OTHER): Payer: Medicare Other | Admitting: Family Medicine

## 2020-06-19 ENCOUNTER — Encounter (INDEPENDENT_AMBULATORY_CARE_PROVIDER_SITE_OTHER): Payer: Self-pay | Admitting: Family Medicine

## 2020-06-19 ENCOUNTER — Other Ambulatory Visit: Payer: Self-pay

## 2020-06-19 VITALS — BP 109/69 | HR 76 | Temp 98.0°F | Ht 65.0 in | Wt 203.0 lb

## 2020-06-19 DIAGNOSIS — Z6841 Body Mass Index (BMI) 40.0 and over, adult: Secondary | ICD-10-CM

## 2020-06-19 DIAGNOSIS — I10 Essential (primary) hypertension: Secondary | ICD-10-CM

## 2020-06-21 ENCOUNTER — Other Ambulatory Visit: Payer: Self-pay

## 2020-06-21 ENCOUNTER — Other Ambulatory Visit (INDEPENDENT_AMBULATORY_CARE_PROVIDER_SITE_OTHER): Payer: Medicare Other

## 2020-06-21 DIAGNOSIS — Z13220 Encounter for screening for lipoid disorders: Secondary | ICD-10-CM

## 2020-06-21 DIAGNOSIS — D72819 Decreased white blood cell count, unspecified: Secondary | ICD-10-CM | POA: Diagnosis not present

## 2020-06-21 DIAGNOSIS — M25561 Pain in right knee: Secondary | ICD-10-CM | POA: Diagnosis not present

## 2020-06-21 DIAGNOSIS — R7303 Prediabetes: Secondary | ICD-10-CM

## 2020-06-21 DIAGNOSIS — R413 Other amnesia: Secondary | ICD-10-CM

## 2020-06-21 DIAGNOSIS — Z1329 Encounter for screening for other suspected endocrine disorder: Secondary | ICD-10-CM

## 2020-06-21 DIAGNOSIS — M25562 Pain in left knee: Secondary | ICD-10-CM

## 2020-06-21 DIAGNOSIS — Z0000 Encounter for general adult medical examination without abnormal findings: Secondary | ICD-10-CM

## 2020-06-21 DIAGNOSIS — Z1389 Encounter for screening for other disorder: Secondary | ICD-10-CM | POA: Diagnosis not present

## 2020-06-21 DIAGNOSIS — G8929 Other chronic pain: Secondary | ICD-10-CM

## 2020-06-21 LAB — CBC WITH DIFFERENTIAL/PLATELET
Basophils Absolute: 0 10*3/uL (ref 0.0–0.1)
Basophils Relative: 0.7 % (ref 0.0–3.0)
Eosinophils Absolute: 0.1 10*3/uL (ref 0.0–0.7)
Eosinophils Relative: 2.5 % (ref 0.0–5.0)
HCT: 39.5 % (ref 36.0–46.0)
Hemoglobin: 13.2 g/dL (ref 12.0–15.0)
Lymphocytes Relative: 41.9 % (ref 12.0–46.0)
Lymphs Abs: 1.2 10*3/uL (ref 0.7–4.0)
MCHC: 33.5 g/dL (ref 30.0–36.0)
MCV: 94.9 fl (ref 78.0–100.0)
Monocytes Absolute: 0.3 10*3/uL (ref 0.1–1.0)
Monocytes Relative: 11.3 % (ref 3.0–12.0)
Neutro Abs: 1.2 10*3/uL — ABNORMAL LOW (ref 1.4–7.7)
Neutrophils Relative %: 43.6 % (ref 43.0–77.0)
Platelets: 167 10*3/uL (ref 150.0–400.0)
RBC: 4.17 Mil/uL (ref 3.87–5.11)
RDW: 14.9 % (ref 11.5–15.5)
WBC: 2.8 10*3/uL — ABNORMAL LOW (ref 4.0–10.5)

## 2020-06-21 LAB — COMPREHENSIVE METABOLIC PANEL
ALT: 15 U/L (ref 0–35)
AST: 18 U/L (ref 0–37)
Albumin: 3.8 g/dL (ref 3.5–5.2)
Alkaline Phosphatase: 51 U/L (ref 39–117)
BUN: 20 mg/dL (ref 6–23)
CO2: 28 mEq/L (ref 19–32)
Calcium: 9 mg/dL (ref 8.4–10.5)
Chloride: 107 mEq/L (ref 96–112)
Creatinine, Ser: 0.82 mg/dL (ref 0.40–1.20)
GFR: 70.84 mL/min (ref >60.00)
Glucose, Bld: 81 mg/dL (ref 70–99)
Potassium: 3.5 mEq/L (ref 3.5–5.1)
Sodium: 142 mEq/L (ref 135–145)
Total Bilirubin: 0.3 mg/dL (ref 0.2–1.2)
Total Protein: 7 g/dL (ref 6.0–8.3)

## 2020-06-21 LAB — LIPID PANEL
Cholesterol: 119 mg/dL (ref 0–200)
HDL: 58.5 mg/dL (ref >39.00)
LDL Cholesterol: 53 mg/dL (ref 0–99)
NonHDL: 60.5
Total CHOL/HDL Ratio: 2
Triglycerides: 40 mg/dL (ref 0.0–149.0)
VLDL: 8 mg/dL (ref 0.0–40.0)

## 2020-06-21 LAB — HEMOGLOBIN A1C: Hgb A1c MFr Bld: 5.9 % (ref 4.6–6.5)

## 2020-06-21 LAB — TSH: TSH: 2.23 u[IU]/mL (ref 0.35–4.50)

## 2020-06-21 NOTE — Addendum Note (Signed)
Addended by: Leeanne Rio on: 06/21/2020 08:08 AM   Modules accepted: Orders

## 2020-06-22 LAB — URINALYSIS, ROUTINE W REFLEX MICROSCOPIC
Bilirubin, UA: NEGATIVE
Glucose, UA: NEGATIVE
Ketones, UA: NEGATIVE
Nitrite, UA: NEGATIVE
Protein,UA: NEGATIVE
RBC, UA: NEGATIVE
Specific Gravity, UA: 1.01 (ref 1.005–1.030)
Urobilinogen, Ur: 0.2 mg/dL (ref 0.2–1.0)
pH, UA: 6.5 (ref 5.0–7.5)

## 2020-06-22 LAB — MICROALBUMIN / CREATININE URINE RATIO
Creatinine, Urine: 47.8 mg/dL
Microalb/Creat Ratio: <6 mg/g creat (ref 0–29)
Microalbumin, Urine: <3 ug/mL

## 2020-06-22 LAB — MICROSCOPIC EXAMINATION
Bacteria, UA: NONE SEEN
Casts: NONE SEEN /lpf

## 2020-06-25 ENCOUNTER — Ambulatory Visit: Payer: Medicare Other | Admitting: Internal Medicine

## 2020-06-25 LAB — PATHOLOGIST SMEAR REVIEW
Basophils Absolute: 0 10*3/uL (ref 0.0–0.2)
Basos: 1 %
EOS (ABSOLUTE): 0.1 10*3/uL (ref 0.0–0.4)
Eos: 3 %
Hematocrit: 42 % (ref 34.0–46.6)
Hemoglobin: 13.4 g/dL (ref 11.1–15.9)
Immature Grans (Abs): 0 10*3/uL (ref 0.0–0.1)
Immature Granulocytes: 0 %
Lymphocytes Absolute: 1.2 10*3/uL (ref 0.7–3.1)
Lymphs: 42 %
MCH: 30.7 pg (ref 26.6–33.0)
MCHC: 31.9 g/dL (ref 31.5–35.7)
MCV: 96 fL (ref 79–97)
Monocytes Absolute: 0.3 10*3/uL (ref 0.1–0.9)
Monocytes: 10 %
Neutrophils Absolute: 1.3 10*3/uL — ABNORMAL LOW (ref 1.4–7.0)
Neutrophils: 44 %
Platelets: 179 10*3/uL (ref 150–450)
RBC: 4.37 x10E6/uL (ref 3.77–5.28)
RDW: 13.5 % (ref 11.7–15.4)
WBC: 2.9 10*3/uL — ABNORMAL LOW (ref 3.4–10.8)

## 2020-06-25 NOTE — Progress Notes (Signed)
Chief Complaint:   OBESITY Magali is here to discuss her progress with her obesity treatment plan along with follow-up of her obesity related diagnoses. Audreena is on keeping a food journal and adhering to recommended goals of 1200 calories and 75+ grams of protein daily and states she is following her eating plan approximately 60% of the time. Saharah states she is on the elliptical for 30 minutes 1 time per week.  Today's visit was #: 53 Starting weight: 268 lbs Starting date: 12/23/2016 Today's weight: 203 lbs Today's date: 06/19/2020 Total lbs lost to date: 36 Total lbs lost since last in-office visit: 1  Interim History: Vanna continues to lose weight. She has done more celebrations eating, but she tried to be mindful and portion control. She is journaling more often. She will be traveling for Mother's Day, and she has a strategy for that situation.  Subjective:   1. Essential hypertension Bianna is doing well with weight loss, and her blood pressure has continued to improve. She is not feeling lightheaded.  Assessment/Plan:   1. Essential hypertension Binta will continue to work on healthy weight loss, diet, and exercise, and increase water to improve blood pressure control. We will watch for signs of hypotension as she continues her lifestyle modifications.  2. Obesity with current BMI of 33.8 Lore is currently in the action stage of change. As such, her goal is to continue with weight loss efforts. She has agreed to keeping a food journal and adhering to recommended goals of 1200 calories and 75+ grams of protein daily.   Exercise goals: As is.  Behavioral modification strategies: increasing lean protein intake, celebration eating strategies and keeping a strict food journal.  Harshini has agreed to follow-up with our clinic in 4 weeks. She was informed of the importance of frequent follow-up visits to maximize her success with intensive lifestyle modifications for her multiple  health conditions.   Objective:   Blood pressure 109/69, pulse 76, temperature 98 F (36.7 C), height 5' 5"  (1.651 m), weight 203 lb (92.1 kg), SpO2 99 %. Body mass index is 33.78 kg/m.  General: Cooperative, alert, well developed, in no acute distress. HEENT: Conjunctivae and lids unremarkable. Cardiovascular: Regular rhythm.  Lungs: Normal work of breathing. Neurologic: No focal deficits.   Lab Results  Component Value Date   CREATININE 0.82 06/21/2020   BUN 20 06/21/2020   NA 142 06/21/2020   K 3.5 06/21/2020   CL 107 06/21/2020   CO2 28 06/21/2020   Lab Results  Component Value Date   ALT 15 06/21/2020   AST 18 06/21/2020   ALKPHOS 51 06/21/2020   BILITOT 0.3 06/21/2020   Lab Results  Component Value Date   HGBA1C 5.9 06/21/2020   HGBA1C 5.8 (H) 04/30/2020   HGBA1C 5.6 10/09/2019   HGBA1C 5.8 (H) 07/05/2019   HGBA1C 5.6 12/05/2018   Lab Results  Component Value Date   INSULIN 4.3 10/09/2019   INSULIN 8.6 07/05/2019   INSULIN 6.4 12/05/2018   INSULIN 3.6 04/27/2018   INSULIN 5.6 12/01/2017   Lab Results  Component Value Date   TSH 2.23 06/21/2020   Lab Results  Component Value Date   CHOL 119 06/21/2020   HDL 58.50 06/21/2020   LDLCALC 53 06/21/2020   TRIG 40.0 06/21/2020   CHOLHDL 2 06/21/2020   Lab Results  Component Value Date   WBC 2.8 (L) 06/21/2020   WBC 2.9 (L) 06/21/2020   HGB 13.2 06/21/2020   HGB 13.4  06/21/2020   HCT 39.5 06/21/2020   HCT 42.0 06/21/2020   MCV 94.9 06/21/2020   MCV 96 06/21/2020   PLT 167.0 06/21/2020   PLT 179 06/21/2020   Lab Results  Component Value Date   IRON 91 12/07/2019   TIBC 255 12/07/2019   FERRITIN 147 12/07/2019   Attestation Statements:   Reviewed by clinician on day of visit: allergies, medications, problem list, medical history, surgical history, family history, social history, and previous encounter notes.  Time spent on visit including pre-visit chart review and post-visit care and  charting was 35 minutes.    I, [name], am acting as transcriptionist for [name], MD.  I have reviewed the above documentation for accuracy and completeness, and I agree with the above. -  [name], MD

## 2020-06-27 ENCOUNTER — Encounter: Payer: Self-pay | Admitting: Podiatry

## 2020-06-27 ENCOUNTER — Other Ambulatory Visit: Payer: Self-pay

## 2020-06-27 ENCOUNTER — Ambulatory Visit (INDEPENDENT_AMBULATORY_CARE_PROVIDER_SITE_OTHER): Payer: Medicare Other | Admitting: Podiatry

## 2020-06-27 DIAGNOSIS — D689 Coagulation defect, unspecified: Secondary | ICD-10-CM

## 2020-06-27 DIAGNOSIS — D2371 Other benign neoplasm of skin of right lower limb, including hip: Secondary | ICD-10-CM | POA: Diagnosis not present

## 2020-06-27 DIAGNOSIS — M79676 Pain in unspecified toe(s): Secondary | ICD-10-CM

## 2020-06-27 DIAGNOSIS — D2372 Other benign neoplasm of skin of left lower limb, including hip: Secondary | ICD-10-CM

## 2020-06-27 DIAGNOSIS — B351 Tinea unguium: Secondary | ICD-10-CM

## 2020-06-27 NOTE — Progress Notes (Signed)
She presents today chief complaint of painfully elongated toenails and calluses bilaterally.  Objective: Vital signs are stable she is alert oriented x3 pulses are palpable.  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  Otherwise toenails are long thick yellow dystrophic-like mycotic and painful palpation as well as debridement.  Assessment: Pain in limb secondary to onychomycosis and benign skin lesions.  Plan: Debridement of benign skin lesions debridement of nails 1 through 5 bilateral.

## 2020-06-28 ENCOUNTER — Ambulatory Visit: Payer: Medicare Other | Attending: Internal Medicine

## 2020-06-28 ENCOUNTER — Other Ambulatory Visit (HOSPITAL_BASED_OUTPATIENT_CLINIC_OR_DEPARTMENT_OTHER): Payer: Self-pay

## 2020-06-28 DIAGNOSIS — Z23 Encounter for immunization: Secondary | ICD-10-CM

## 2020-06-28 MED ORDER — PFIZER-BIONT COVID-19 VAC-TRIS 30 MCG/0.3ML IM SUSP
INTRAMUSCULAR | 0 refills | Status: DC
  Filled 2020-06-28: qty 0.3, 1d supply, fill #0

## 2020-06-28 NOTE — Progress Notes (Signed)
   Covid-19 Vaccination Clinic  Name:  Heather [name]    MRN: [id number] DOB: Feb 27, 1946  06/28/2020  [name] was observed post Covid-19 immunization for 15 minutes without incident. She was provided with Vaccine Information Sheet and instruction to access the V-Safe system.   [name] was instructed to call 911 with any severe reactions post vaccine: [name] Difficulty breathing  . Swelling of face and throat  . A fast heartbeat  . A bad rash all over body  . Dizziness and weakness   Immunizations Administered    Name Date Dose VIS Date Route   PFIZER Comrnaty(Gray TOP) Covid-19 Vaccine 06/28/2020 12:09 PM 0.3 mL 02/08/2020 Intramuscular   Manufacturer: [place]   Lot: [id number]   NDC: [id number]

## 2020-07-02 ENCOUNTER — Encounter: Payer: Self-pay | Admitting: Internal Medicine

## 2020-07-02 ENCOUNTER — Ambulatory Visit (INDEPENDENT_AMBULATORY_CARE_PROVIDER_SITE_OTHER): Payer: Medicare Other | Admitting: Internal Medicine

## 2020-07-02 ENCOUNTER — Other Ambulatory Visit: Payer: Self-pay

## 2020-07-02 VITALS — BP 100/60 | HR 74 | Temp 97.0°F | Ht 65.0 in | Wt 208.6 lb

## 2020-07-02 DIAGNOSIS — G8929 Other chronic pain: Secondary | ICD-10-CM | POA: Diagnosis not present

## 2020-07-02 DIAGNOSIS — M5431 Sciatica, right side: Secondary | ICD-10-CM

## 2020-07-02 DIAGNOSIS — M25562 Pain in left knee: Secondary | ICD-10-CM

## 2020-07-02 DIAGNOSIS — R6884 Jaw pain: Secondary | ICD-10-CM

## 2020-07-02 DIAGNOSIS — M5416 Radiculopathy, lumbar region: Secondary | ICD-10-CM

## 2020-07-02 DIAGNOSIS — R609 Edema, unspecified: Secondary | ICD-10-CM

## 2020-07-02 DIAGNOSIS — M5432 Sciatica, left side: Secondary | ICD-10-CM

## 2020-07-02 DIAGNOSIS — M79641 Pain in right hand: Secondary | ICD-10-CM

## 2020-07-02 DIAGNOSIS — E119 Type 2 diabetes mellitus without complications: Secondary | ICD-10-CM | POA: Diagnosis not present

## 2020-07-02 DIAGNOSIS — M25561 Pain in right knee: Secondary | ICD-10-CM | POA: Diagnosis not present

## 2020-07-02 DIAGNOSIS — M79642 Pain in left hand: Secondary | ICD-10-CM | POA: Diagnosis not present

## 2020-07-02 DIAGNOSIS — M7989 Other specified soft tissue disorders: Secondary | ICD-10-CM

## 2020-07-02 LAB — TROPONIN I (HIGH SENSITIVITY): High Sens Troponin I: 3 ng/L (ref 2–17)

## 2020-07-02 MED ORDER — TRAMADOL HCL 50 MG PO TABS
50.0000 mg | ORAL_TABLET | Freq: Two times a day (BID) | ORAL | 0 refills | Status: DC | PRN
Start: 2020-07-02 — End: 2021-01-02

## 2020-07-02 NOTE — Patient Instructions (Addendum)
Tell dentist about left jaw pain     Sciatica Rehab Ask your health care provider which exercises are safe for you. Do exercises exactly as told by your health care provider and adjust them as directed. It is normal to feel mild stretching, pulling, tightness, or discomfort as you do these exercises. Stop right away if you feel sudden pain or your pain gets worse. Do not begin these exercises until told by your health care provider. Stretching and range-of-motion exercises These exercises warm up your muscles and joints and improve the movement and flexibility of your hips and back. These exercises also help to relieve pain, numbness, and tingling. Sciatic nerve glide 1. Sit in a chair with your head facing down toward your chest. Place your hands behind your back. Let your shoulders slump forward. 2. Slowly straighten one of your legs while you tilt your head back as if you are looking toward the ceiling. Only straighten your leg as far as you can without making your symptoms worse. 3. Hold this position for __________ seconds. 4. Slowly return to the starting position. 5. Repeat with your other leg. Repeat __________ times. Complete this exercise __________ times a day. Knee to chest with hip adduction and internal rotation 1. Lie on your back on a firm surface with both legs straight. 2. Bend one of your knees and move it up toward your chest until you feel a gentle stretch in your lower back and buttock. Then, move your knee toward the shoulder that is on the opposite side from your leg. This is hip adduction and internal rotation. ? Hold your leg in this position by holding on to the front of your knee. 3. Hold this position for __________ seconds. 4. Slowly return to the starting position. 5. Repeat with your other leg. Repeat __________ times. Complete this exercise __________ times a day.   Prone extension on elbows 1. Lie on your abdomen on a firm surface. A bed may be too soft for  this exercise. 2. Prop yourself up on your elbows. 3. Use your arms to help lift your chest up until you feel a gentle stretch in your abdomen and your lower back. ? This will place some of your body weight on your elbows. If this is uncomfortable, try stacking pillows under your chest. ? Your hips should stay down, against the surface that you are lying on. Keep your hip and back muscles relaxed. 4. Hold this position for __________ seconds. 5. Slowly relax your upper body and return to the starting position. Repeat __________ times. Complete this exercise __________ times a day.   Strengthening exercises These exercises build strength and endurance in your back. Endurance is the ability to use your muscles for a long time, even after they get tired. Pelvic tilt This exercise strengthens the muscles that lie deep in the abdomen. 1. Lie on your back on a firm surface. Bend your knees and keep your feet flat on the floor. 2. Tense your abdominal muscles. Tip your pelvis up toward the ceiling and flatten your lower back into the floor. ? To help with this exercise, you may place a small towel under your lower back and try to push your back into the towel. 3. Hold this position for __________ seconds. 4. Let your muscles relax completely before you repeat this exercise. Repeat __________ times. Complete this exercise __________ times a day. Alternating arm and leg raises 1. Get on your hands and knees on a firm surface. If  you are on a hard floor, you may want to use padding, such as an exercise mat, to cushion your knees. 2. Line up your arms and legs. Your hands should be directly below your shoulders, and your knees should be directly below your hips. 3. Lift your left leg behind you. At the same time, raise your right arm and straighten it in front of you. ? Do not lift your leg higher than your hip. ? Do not lift your arm higher than your shoulder. ? Keep your abdominal and back muscles  tight. ? Keep your hips facing the ground. ? Do not arch your back. ? Keep your balance carefully, and do not hold your breath. 4. Hold this position for __________ seconds. 5. Slowly return to the starting position. 6. Repeat with your right leg and your left arm. Repeat __________ times. Complete this exercise __________ times a day.   Posture and body mechanics Good posture and healthy body mechanics can help to relieve stress in your body's tissues and joints. Body mechanics refers to the movements and positions of your body while you do your daily activities. Posture is part of body mechanics. Good posture means:  Your spine is in its natural S-curve position (neutral).  Your shoulders are pulled back slightly.  Your head is not tipped forward. Follow these guidelines to improve your posture and body mechanics in your everyday activities. Standing  When standing, keep your spine neutral and your feet about hip width apart. Keep a slight bend in your knees. Your ears, shoulders, and hips should line up.  When you do a task in which you stand in one place for a long time, place one foot up on a stable object that is 2-4 inches (5-10 cm) high, such as a footstool. This helps keep your spine neutral.   Sitting  When sitting, keep your spine neutral and keep your feet flat on the floor. Use a footrest, if necessary, and keep your thighs parallel to the floor. Avoid rounding your shoulders, and avoid tilting your head forward.  When working at a desk or a computer, keep your desk at a height where your hands are slightly lower than your elbows. Slide your chair under your desk so you are close enough to maintain good posture.  When working at a computer, place your monitor at a height where you are looking straight ahead and you do not have to tilt your head forward or downward to look at the screen.   Resting  When lying down and resting, avoid positions that are most painful for  you.  If you have pain with activities such as sitting, bending, stooping, or squatting, lie in a position in which your body does not bend very much. For example, avoid curling up on your side with your arms and knees near your chest (fetal position).  If you have pain with activities such as standing for a long time or reaching with your arms, lie with your spine in a neutral position and bend your knees slightly. Try the following positions: ? Lying on your side with a pillow between your knees. ? Lying on your back with a pillow under your knees. Lifting  When lifting objects, keep your feet at least shoulder width apart and tighten your abdominal muscles.  Bend your knees and hips and keep your spine neutral. It is important to lift using the strength of your legs, not your back. Do not lock your knees straight out.  Always ask for help to lift heavy or awkward objects.   This information is not intended to replace advice given to you by your health care provider. Make sure you discuss any questions you have with your health care provider. Document Revised: 06/10/2018 Document Reviewed: 03/10/2018 Elsevier Patient Education  2021 [employer].  Temporomandibular Joint Syndrome  Temporomandibular joint syndrome (TMJ syndrome) is a condition that causes pain in the temporomandibular joints. These joints are located near your ears and allow your jaw to open and close. For people with TMJ syndrome, chewing, biting, or other movements of the jaw can be difficult or painful. TMJ syndrome is often mild and goes away within a few weeks. However, sometimes the condition becomes a long-term (chronic) problem. What are the causes? This condition may be caused by:  Grinding your teeth or clenching your jaw. Some people do this when they are under stress.  Arthritis.  Injury to the jaw.  Head or neck injury.  Teeth or dentures that are not aligned well. In some cases, the cause of TMJ  syndrome may not be known. What are the signs or symptoms? The most common symptom of this condition is an aching pain on the side of the head in the area of the TMJ. Other symptoms may include:  Pain when moving your jaw, such as when chewing or biting.  Being unable to open your jaw all the way.  Making a clicking sound when you open your mouth.  Headache.  Earache.  Neck or shoulder pain. How is this diagnosed? This condition may be diagnosed based on:  Your symptoms and medical history.  A physical exam. Your health care provider may check the range of motion of your jaw.  Imaging tests, such as X-rays or an MRI. You may also need to see your dentist, who will determine if your teeth and jaw are lined up correctly. How is this treated? TMJ syndrome often goes away on its own. If treatment is needed, the options may include:  Eating soft foods and applying ice or heat.  Medicines to relieve pain or inflammation.  Medicines or massage to relax the muscles.  A splint, bite plate, or mouthpiece to prevent teeth grinding or jaw clenching.  Relaxation techniques or counseling to help reduce stress.  A therapy for pain in which an electrical current is applied to the nerves through the skin (transcutaneous electrical nerve stimulation).  Acupuncture. This is sometimes helpful to relieve pain.  Jaw surgery. This is rarely needed. Follow these instructions at home: Eating and drinking  Eat a soft diet if you are having trouble chewing.  Avoid foods that require a lot of chewing. Do not chew gum. General instructions  Take over-the-counter and prescription medicines only as told by your health care provider.  If directed, put ice on the painful area. ? Put ice in a plastic bag. ? Place a towel between your skin and the bag. ? Leave the ice on for 20 minutes, 2-3 times a day.  Apply a warm, wet cloth (warm compress) to the painful area as directed.  Massage your  jaw area and do any jaw stretching exercises as told by your health care provider.  If you were given a splint, bite plate, or mouthpiece, wear it as told by your health care provider.  Keep all follow-up visits as told by your health care provider. This is important.   Contact a health care provider if:  You are having trouble eating.  You have new or worsening symptoms. Get help right away if:  Your jaw locks open or closed. Summary  Temporomandibular joint syndrome (TMJ syndrome) is a condition that causes pain in the temporomandibular joints. These joints are located near your ears and allow your jaw to open and close.  TMJ syndrome is often mild and goes away within a few weeks. However, sometimes the condition becomes a long-term (chronic) problem.  Symptoms include an aching pain on the side of the head in the area of the TMJ, pain when chewing or biting, and being unable to open your jaw all the way. You may also make a clicking sound when you open your mouth.  TMJ syndrome often goes away on its own. If treatment is needed, it may include medicines to relieve pain, reduce inflammation, or relax the muscles. A splint, bite plate, or mouthpiece may also be used to prevent teeth grinding or jaw clenching. This information is not intended to replace advice given to you by your health care provider. Make sure you discuss any questions you have with your health care provider. Document Revised: 04/30/2017 Document Reviewed: 03/30/2017 Elsevier Patient Education  2021 [employer].

## 2020-07-02 NOTE — Progress Notes (Signed)
Chief Complaint  Patient presents with  . Follow-up  . Jaw Pain   Fu with husband  1. Left leg swelling chronically will refer to vascular w/u lymphedema, PAD. Wears compression stockings at times and elevation but left lower leg/ankle edema still present  2. C/o left jaw pain denies chest pain check trop today r/o cardiac also disc with dental she reports feels like indigestion in her jaw denies CP, left arm pain  3. MSK chronic knee pain, b/l hand pain MRI L spine abnormal she reports trouble getting out of bed walking with cane/walker at times pain in lower back in am worse with stiffness. Pain radiates down buttocks at times and had nerve blood L5 and this helped in the past and water aerobics which helped. Tramadol qd prn helps as well. 6/10 knee pain today  IMPRESSION: Multilevel lumbar degenerative changes above  Extraforaminal disc protrusion on the right L5-S1 could cause right L5 radicular symptoms.  Electronically Signed   By: Franchot Gallo M.D.   On: 01/18/2018 15:44 4. Memory loss est with neurology MRI 04/09/20 negative   Review of Systems  Constitutional: Negative for weight loss.  HENT: Negative for hearing loss.   Eyes: Negative for blurred vision.  Respiratory: Negative for shortness of breath.   Cardiovascular: Negative for chest pain.  Gastrointestinal: Negative for abdominal pain.  Musculoskeletal: Positive for back pain and joint pain. Negative for falls.  Skin: Negative for rash.  Neurological: Negative for headaches.  Psychiatric/Behavioral: Positive for memory loss. Negative for depression.   Past Medical History:  Diagnosis Date  . Abdominal hernia   . Abnormal Pap smear   . ALLERGIC RHINITIS 10/22/2006  . Allergy   . ANEMIA 12/18/2008  . Arthritis    scoliosis moderate deg changes lumbar Xray 11/04/17   . ASYMPTOMATIC POSTMENOPAUSAL STATUS 11/22/2007  . Back pain   . CAD (coronary artery disease)    in LAD  . CHF (congestive heart failure) ([place])    . Complication of anesthesia   . Degenerative arthritis   . DIABETES MELLITUS, TYPE II 01/13/2007  . Diverticulosis   . Dyslipidemia   . Fibroid   . Frequent headaches   . GERD 07/20/2007  . GOITER, MULTINODULAR 07/20/2007  . Headache(784.0) 07/20/2007  . HEARING LOSS 11/22/2007  . Hiatal hernia   . Hiatal hernia    large  . History of chicken pox   . Hx of colposcopy with cervical biopsy   . HYPERCHOLESTEROLEMIA 01/13/2007  . Hyperglycemia   . HYPERTENSION 10/22/2006  . Lung nodule    unchanged since 05/26/13   . Migraines   . NASH (nonalcoholic steatohepatitis)   . Nocturnal hypoxemia 02/17/2013  . Obesity   . Obstructive sleep apnea   . OSA on CPAP    per neurology  . OSTEOARTHRITIS 10/22/2006  . Other chronic nonalcoholic liver disease 5/36/1443  . Sedimentation rate elevation   . Sleep apnea    on cpap  . Urine incontinence   . UTI (urinary tract infection)    Past Surgical History:  Procedure Laterality Date  . BREAST SURGERY  1984   Breast reduction b/l   . CATARACT EXTRACTION, BILATERAL    . DEXA  08/2005  . DILATION AND CURETTAGE OF UTERUS    . ELECTROCARDIOGRAM  10/15/2006  . ESOPHAGOGASTRODUODENOSCOPY  12/08/2005  . FOOT SURGERY     hammertoe and bunion 05/2017   . Stress Cardiolite  10/21/2005  . sweat gland removal    . WISDOM TOOTH  EXTRACTION     Family History  Problem Relation Age of Onset  . Asthma Mother   . Depression Mother   . Bipolar disorder Mother   . Dementia Mother   . Arthritis Mother   . Breast cancer Mother        primary  . Colon cancer Mother        mets from breast  . Cancer Mother        colon  . Hypertension Father   . Migraines Father   . Cancer Maternal Aunt        ?  [name] Heart disease Maternal Grandmother   . Cancer Maternal Grandfather        stomach ?   Social History   Socioeconomic History  . Marital status: Married    Spouse name: [name]  . Number of children: 2  . Years of education: College  . Highest  education level: Not on file  Occupational History  . Occupation: Retired  Tobacco Use  . Smoking status: Former Smoker    Packs/day: 2.00    Years: 20.00    Pack years: 40.00    Types: Cigarettes    Quit date: 03/03/1979    Years since quitting: 41.3  . Smokeless tobacco: Never Used  Substance and Sexual Activity  . Alcohol use: Yes    Comment: rare  . Drug use: No  . Sexual activity: Yes    Birth control/protection: Surgical  Other Topics Concern  . Not on file  Social History Narrative   Patient is married [name]).   Right Handed   Drinks 1 cup caffeine   Social Determinants of Health   Financial Resource Strain: Low Risk   . Difficulty of Paying Living Expenses: Not hard at all  Food Insecurity: No Food Insecurity  . Worried About Charity fundraiser in the Last Year: Never true  . Ran Out of Food in the Last Year: Never true  Transportation Needs: No Transportation Needs  . Lack of Transportation (Medical): No  . Lack of Transportation (Non-Medical): No  Physical Activity: Not on file  Stress: No Stress Concern Present  . Feeling of Stress : Not at all  Social Connections: Unknown  . Frequency of Communication with Friends and Family: Not on file  . Frequency of Social Gatherings with Friends and Family: Not on file  . Attends Religious Services: Not on file  . Active Member of Clubs or Organizations: Not on file  . Attends Archivist Meetings: Not on file  . Marital Status: Married  Human resources officer Violence: Not At Risk  . Fear of Current or Ex-Partner: No  . Emotionally Abused: No  . Physically Abused: No  . Sexually Abused: No   Current Meds  Medication Sig  . acetaminophen (TYLENOL) 500 MG tablet Take 650 mg by mouth 3 (three) times daily as needed for pain.   . cetirizine (ZYRTEC) 10 MG tablet Take 10 mg by mouth daily.  . clopidogrel (PLAVIX) 75 MG tablet Take 1 tablet (75 mg total) by mouth daily. Needs appt for future refills  . COVID-19  mRNA Vac-TriS, Pfizer, (PFIZER-BIONT COVID-19 VAC-TRIS) SUSP injection Inject into the muscle.  . Cyanocobalamin 1000 MCG CAPS Take 1 capsule by mouth daily.  . diclofenac Sodium (VOLTAREN) 1 % GEL Apply topically 4 (four) times daily.  [name] ezetimibe (ZETIA) 10 MG tablet Take 1 tablet (10 mg total) by mouth daily.  . Ferrous Sulfate (IRON) 325 (65 FE) MG TABS  Take 1 tablet by mouth daily.  . furosemide (LASIX) 40 MG tablet Take 40 mg by mouth daily.  [name] gabapentin (NEURONTIN) 100 MG capsule Take 3 pills at bedtime  . glucosamine-chondroitin 500-400 MG tablet Take 1 tablet by mouth 2 (two) times daily.  [name] KLOR-CON M20 20 MEQ tablet TAKE 1 TABLET DAILY  . Multiple Vitamin (MULTIVITAMIN) tablet Take 1 tablet by mouth daily.  . Omega-3 Fatty Acids (FISH OIL) 1000 MG CAPS Take 1 capsule by mouth daily.  . pantoprazole (PROTONIX) 20 MG tablet TAKE 1 TABLET DAILY 30     MINUTES BEFORE FOOD        (DISCONTINUE DEXILANT,     DISCONTINUE 40MG DOSE)  . polyethylene glycol (MIRALAX / GLYCOLAX) 17 g packet Take 17 g by mouth daily as needed.  . pyridOXINE (VITAMIN B-6) 100 MG tablet Take 200 mg by mouth daily.  . rosuvastatin (CRESTOR) 40 MG tablet Take 1 tablet (40 mg total) by mouth daily. At night  . SF 5000 PLUS 1.1 % CREA dental cream   . topiramate (TOPAMAX) 25 MG tablet Take 3 tablets (75 mg total) by mouth at bedtime.  . Turmeric 500 MG CAPS Take 1 capsule by mouth daily.  [name] UNABLE TO FIND Instaflex for knees  . Vitamin D, Ergocalciferol, (DRISDOL) 1.25 MG (50000 UNIT) CAPS capsule Take 1 capsule (50,000 Units total) by mouth every 7 (seven) days.  . vitamin E 200 UNIT capsule Take 200 Units by mouth daily.  . [DISCONTINUED] traMADol (ULTRAM) 50 MG tablet Take by mouth every 6 (six) hours as needed.   Allergies  Allergen Reactions  . Aspirin   . Coconut Oil   . Lisinopril     REACTION: cough  . Metoprolol Itching  . Peanut-Containing Drug Products   . Penicillins     REACTION: rash  .  Prednisone   . Strawberry Extract    Recent Results (from the past 2160 hour(s))  Comprehensive metabolic panel     Status: None   Collection Time: 04/30/20 10:48 AM  Result Value Ref Range   Glucose 80 65 - 99 mg/dL   BUN 17 8 - 27 mg/dL   Creatinine, Ser 0.70 0.57 - 1.00 mg/dL   eGFR 91 >59 mL/min/1.73    Comment: **In accordance with recommendations from the NKF-ASN Task force,**   [facility] has updated its eGFR calculation to the 2021 CKD-EPI   creatinine equation that estimates kidney function without a race   variable.    BUN/Creatinine Ratio 24 12 - 28   Sodium 141 134 - 144 mmol/L   Potassium 3.9 3.5 - 5.2 mmol/L   Chloride 106 96 - 106 mmol/L   CO2 21 20 - 29 mmol/L   Calcium 9.0 8.7 - 10.3 mg/dL   Total Protein 6.8 6.0 - 8.5 g/dL   Albumin 4.0 3.7 - 4.7 g/dL   Globulin, Total 2.8 1.5 - 4.5 g/dL   Albumin/Globulin Ratio 1.4 1.2 - 2.2   Bilirubin Total 0.2 0.0 - 1.2 mg/dL   Alkaline Phosphatase 64 44 - 121 IU/L   AST 18 0 - 40 IU/L   ALT 12 0 - 32 IU/L  Hemoglobin A1c     Status: Abnormal   Collection Time: 04/30/20 10:48 AM  Result Value Ref Range   Hgb A1c MFr Bld 5.8 (H) 4.8 - 5.6 %    Comment:          Prediabetes: 5.7 - 6.4  Diabetes: >6.4          Glycemic control for adults with diabetes: <7.0    Est. average glucose Bld gHb Est-mCnc 120 mg/dL  Lipid Panel With LDL/HDL Ratio     Status: Abnormal   Collection Time: 04/30/20 10:48 AM  Result Value Ref Range   Cholesterol, Total 184 100 - 199 mg/dL   Triglycerides 54 0 - 149 mg/dL   HDL 68 >39 mg/dL   VLDL Cholesterol Cal 10 5 - 40 mg/dL   LDL Chol Calc (NIH) 106 (H) 0 - 99 mg/dL   LDL/HDL Ratio 1.6 0.0 - 3.2 ratio    Comment:                                     LDL/HDL Ratio                                             Men  Women                               1/2 Avg.Risk  1.0    1.5                                   Avg.Risk  3.6    3.2                                2X Avg.Risk  6.2    5.0                                 3X Avg.Risk  8.0    6.1   VITAMIN D 25 Hydroxy (Vit-D Deficiency, Fractures)     Status: None   Collection Time: 04/30/20 10:48 AM  Result Value Ref Range   Vit D, 25-Hydroxy 58.1 30.0 - 100.0 ng/mL    Comment: Vitamin D deficiency has been defined by the [place] practice guideline as a level of serum 25-OH vitamin D less than 20 ng/mL (1,2). The Endocrine Society went on to further define vitamin D insufficiency as a level between 21 and 29 ng/mL (2). 1. IOM (Institute of Medicine). 2010. Dietary reference    intakes for calcium and D. [place]: The    [employer]. 2. Holick MF, Binkley Liborio Negron Torres, Bischoff-Ferrari HA, et al.    Evaluation, treatment, and prevention of vitamin D    deficiency: an Endocrine Society clinical practice    guideline. JCEM. 2011 Jul; 96(7):1911-30.   Urinalysis, Routine w reflex microscopic     Status: Abnormal   Collection Time: 06/21/20  8:08 AM  Result Value Ref Range   Specific Gravity, UA 1.010 1.005 - 1.030   pH, UA 6.5 5.0 - 7.5   Color, UA Yellow Yellow   Appearance Ur Clear Clear   Leukocytes,UA 2+ (A) Negative   Protein,UA Negative Negative/Trace   Glucose, UA Negative Negative   Ketones, UA Negative Negative   RBC, UA Negative Negative   Bilirubin, UA Negative Negative   Urobilinogen, Ur  0.2 0.2 - 1.0 mg/dL   Nitrite, UA Negative Negative   Microscopic Examination See below:     Comment: Microscopic was indicated and was performed.  TSH     Status: None   Collection Time: 06/21/20  8:08 AM  Result Value Ref Range   TSH 2.23 0.35 - 4.50 uIU/mL  Microalbumin / creatinine urine ratio     Status: None   Collection Time: 06/21/20  8:08 AM  Result Value Ref Range   Creatinine, Urine 47.8 Not Estab. mg/dL   Microalbumin, Urine <3.0 Not Estab. ug/mL   Microalb/Creat Ratio <6 0 - 29 mg/g creat    Comment:                        Normal:                0 -  29                         Moderately increased: 30 - 300                        Severely increased:       >300   Hemoglobin A1c     Status: None   Collection Time: 06/21/20  8:08 AM  Result Value Ref Range   Hgb A1c MFr Bld 5.9 4.6 - 6.5 %    Comment: Glycemic Control Guidelines for People with Diabetes:Non Diabetic:  <6%Goal of Therapy: <7%Additional Action Suggested:  >8%   CBC with Differential/Platelet     Status: Abnormal   Collection Time: 06/21/20  8:08 AM  Result Value Ref Range   WBC 2.8 (L) 4.0 - 10.5 K/uL   RBC 4.17 3.87 - 5.11 Mil/uL   Hemoglobin 13.2 12.0 - 15.0 g/dL   HCT 39.5 36.0 - 46.0 %   MCV 94.9 78.0 - 100.0 fl   MCHC 33.5 30.0 - 36.0 g/dL   RDW 14.9 11.5 - 15.5 %   Platelets 167.0 150.0 - 400.0 K/uL   Neutrophils Relative % 43.6 43.0 - 77.0 %   Lymphocytes Relative 41.9 12.0 - 46.0 %   Monocytes Relative 11.3 3.0 - 12.0 %   Eosinophils Relative 2.5 0.0 - 5.0 %   Basophils Relative 0.7 0.0 - 3.0 %   Neutro Abs 1.2 (L) 1.4 - 7.7 K/uL   Lymphs Abs 1.2 0.7 - 4.0 K/uL   Monocytes Absolute 0.3 0.1 - 1.0 K/uL   Eosinophils Absolute 0.1 0.0 - 0.7 K/uL   Basophils Absolute 0.0 0.0 - 0.1 K/uL  Lipid panel     Status: None   Collection Time: 06/21/20  8:08 AM  Result Value Ref Range   Cholesterol 119 0 - 200 mg/dL    Comment: ATP III Classification       Desirable:  < 200 mg/dL               Borderline High:  200 - 239 mg/dL          High:  > = 240 mg/dL   Triglycerides 40.0 0.0 - 149.0 mg/dL    Comment: Normal:  <150 mg/dLBorderline High:  150 - 199 mg/dL   HDL 58.50 >39.00 mg/dL   VLDL 8.0 0.0 - 40.0 mg/dL   LDL Cholesterol 53 0 - 99 mg/dL   Total CHOL/HDL Ratio 2     Comment:  Men          Women1/2 Average Risk     3.4          3.3Average Risk          5.0          4.42X Average Risk          9.6          7.13X Average Risk          15.0          11.0                       NonHDL 60.50     Comment: NOTE:  Non-HDL goal should be 30 mg/dL higher than patient's  LDL goal (i.e. LDL goal of < 70 mg/dL, would have non-HDL goal of < 100 mg/dL)  Comprehensive metabolic panel     Status: None   Collection Time: 06/21/20  8:08 AM  Result Value Ref Range   Sodium 142 135 - 145 mEq/L   Potassium 3.5 3.5 - 5.1 mEq/L   Chloride 107 96 - 112 mEq/L   CO2 28 19 - 32 mEq/L   Glucose, Bld 81 70 - 99 mg/dL   BUN 20 6 - 23 mg/dL   Creatinine, Ser 0.82 0.40 - 1.20 mg/dL   Total Bilirubin 0.3 0.2 - 1.2 mg/dL   Alkaline Phosphatase 51 39 - 117 U/L   AST 18 0 - 37 U/L   ALT 15 0 - 35 U/L   Total Protein 7.0 6.0 - 8.3 g/dL   Albumin 3.8 3.5 - 5.2 g/dL   GFR 70.84 >60.00 mL/min    Comment: Calculated using the CKD-EPI Creatinine Equation (2021)   Calcium 9.0 8.4 - 10.5 mg/dL  Pathologist smear review     Status: Abnormal   Collection Time: 06/21/20  8:08 AM  Result Value Ref Range   Path Rev WBC Comment     Comment: Mildly decreased with a mild neutropenia.   Path Rev RBC Comment     Comment: Normochromic normocytic   Path Rev PLTs Comment     Comment: Normal in number and morphology.   PATH INTERP BLD-IMP Comment     Comment: Neutropenia Comment: Common causes of neutropenia include drug effect and viral infection. Clinical correlation is necessary.    PATHOLOGIST NAME Comment     Comment: Reviewed by:  Delorse Lek, MD  Pathologist   WBC 2.9 (L) 3.4 - 10.8 x10E3/uL   RBC 4.37 3.77 - 5.28 x10E6/uL   Hemoglobin 13.4 11.1 - 15.9 g/dL   Hematocrit 42.0 34.0 - 46.6 %   MCV 96 79 - 97 fL   MCH 30.7 26.6 - 33.0 pg   MCHC 31.9 31.5 - 35.7 g/dL   RDW 13.5 11.7 - 15.4 %   Platelets 179 150 - 450 x10E3/uL   Neutrophils 44 Not Estab. %   Lymphs 42 Not Estab. %   Monocytes 10 Not Estab. %   Eos 3 Not Estab. %   Basos 1 Not Estab. %   Neutrophils Absolute 1.3 (L) 1.4 - 7.0 x10E3/uL   Lymphocytes Absolute 1.2 0.7 - 3.1 x10E3/uL   Monocytes Absolute 0.3 0.1 - 0.9 x10E3/uL   EOS (ABSOLUTE) 0.1 0.0 - 0.4 x10E3/uL   Basophils Absolute 0.0 0.0 - 0.2 x10E3/uL    Immature Granulocytes 0 Not Estab. %   Immature Grans (Abs) 0.0 0.0 - 0.1 x10E3/uL  Microscopic Examination  Status: None   Collection Time: 06/21/20  8:08 AM   Urine  Result Value Ref Range   WBC, UA 0-5 0 - 5 /hpf   RBC 0-2 0 - 2 /hpf   Epithelial Cells (non renal) 0-10 0 - 10 /hpf   Casts None seen None seen /lpf   Bacteria, UA None seen None seen/Few  Troponin I (High Sensitivity)     Status: None   Collection Time: 07/02/20 12:31 PM  Result Value Ref Range   High Sens Troponin I 3 2 - 17 ng/L   Objective  Body mass index is 34.71 kg/m. Wt Readings from Last 3 Encounters:  07/02/20 208 lb 9.6 oz (94.6 kg)  06/19/20 203 lb (92.1 kg)  05/22/20 204 lb (92.5 kg)   Temp Readings from Last 3 Encounters:  07/02/20 (!) 97 F (36.1 C) (Temporal)  06/19/20 98 F (36.7 C)  05/22/20 98.2 F (36.8 C)   BP Readings from Last 3 Encounters:  07/02/20 100/60  06/19/20 109/69  05/22/20 110/69   Pulse Readings from Last 3 Encounters:  07/02/20 74  06/19/20 76  05/22/20 78    Physical Exam Vitals and nursing note reviewed.  Constitutional:      Appearance: Normal appearance. She is well-developed and well-groomed. She is obese.  HENT:     Head: Normocephalic and atraumatic.  Eyes:     Conjunctiva/sclera: Conjunctivae normal.     Pupils: Pupils are equal, round, and reactive to light.  Cardiovascular:     Rate and Rhythm: Normal rate and regular rhythm.     Pulses: Normal pulses.     Heart sounds: Normal heart sounds.  Pulmonary:     Effort: Pulmonary effort is normal.  Skin:    General: Skin is warm and dry.  Neurological:     General: No focal deficit present.     Mental Status: She is alert and oriented to person, place, and time. Mental status is at baseline.     Gait: Gait abnormal.     Comments: Walking with cane and slow to get up sitting position  Psychiatric:        Attention and Perception: Attention and perception normal.        Mood and Affect: Mood  and affect normal.        Speech: Speech normal.        Behavior: Behavior normal. Behavior is cooperative.        Thought Content: Thought content normal.        Cognition and Memory: Cognition and memory normal.        Judgment: Judgment normal.     Assessment  Plan  Jaw pain - Plan: Troponin I (High Sensitivity) neg likely dental f/u dentitis   Bilateral sciatica - Plan: Ambulatory referral to Physical Therapy Pain in both hands - Plan: Ambulatory referral to Physical Therapy Lumbar radiculopathy  Chronic pain of both knees - Plan: traMADol (ULTRAM) 50 MG tablet qd prn, Ambulatory referral to Physical Therapy [facility] [place] F/u Dr. Tamala Julian sports med Wants to consider water aerobics see if he knows where  Left leg swelling ?lymphedema r/o PVD vein valve issues- Plan: Ambulatory referral to Vascular Surgery  HM Fasting labs 05/2020  Flu shot high doseutd prevnar had 01/30/14 pna 23given 01/18/2019 zostavax had 01/30/14 -consider shingrix given Rx for this prev11/18/20 Tdap 01/30/14 covid 194/4  DEXA 03/06/15 normal   mammo copy get from Dr. Leo Grosser [facility] -per pt she will f/u with Dr. Mancel Bale  who ordered her mammogram -mammogram 07/03/19 normal scheduled 2022 per pt  Pap out of age window  Colonoscopy 06/18/16 severe diverticulosis polyp x 1 tubular adenoma FH mom colon cancer f/u in 5 years  HCV neg 12/28/11  [place] [facility] exam due as of 03/12/20 , scheduled as of 07/02/20   Former smoker quit in 3s smoker x 20 years+ 2 ppd. CT 01/14/18 mild COPD (off Advair), stable goiter, mild CAD, stable left lung nodule lower lobe likely benign  CT chest 12/2017 benign lung nodules noted  Other providers [facility] Dr. Leafy Ro, congratulated Sports medicine Dr. Tamala Julian Cardiologist Dr. [name] [place] [facility] as of 03/12/20 needs to sch    Provider: Dr. Olivia Mackie McLean-Scocuzza-Internal Medicine

## 2020-07-04 DIAGNOSIS — M79642 Pain in left hand: Secondary | ICD-10-CM | POA: Insufficient documentation

## 2020-07-04 DIAGNOSIS — M79641 Pain in right hand: Secondary | ICD-10-CM | POA: Insufficient documentation

## 2020-07-04 DIAGNOSIS — M5431 Sciatica, right side: Secondary | ICD-10-CM | POA: Insufficient documentation

## 2020-07-04 DIAGNOSIS — M5416 Radiculopathy, lumbar region: Secondary | ICD-10-CM | POA: Insufficient documentation

## 2020-07-04 DIAGNOSIS — M5432 Sciatica, left side: Secondary | ICD-10-CM | POA: Insufficient documentation

## 2020-07-04 MED ORDER — POTASSIUM CHLORIDE CRYS ER 20 MEQ PO TBCR: 20.0000 meq | EXTENDED_RELEASE_TABLET | Freq: Every day | ORAL | 3 refills | Status: DC

## 2020-07-04 MED ORDER — ROSUVASTATIN CALCIUM 40 MG PO TABS: 40.0000 mg | ORAL_TABLET | Freq: Every day | ORAL | 3 refills | Status: DC

## 2020-07-11 DIAGNOSIS — M25561 Pain in right knee: Secondary | ICD-10-CM | POA: Diagnosis not present

## 2020-07-11 DIAGNOSIS — R531 Weakness: Secondary | ICD-10-CM | POA: Diagnosis not present

## 2020-07-11 DIAGNOSIS — M25562 Pain in left knee: Secondary | ICD-10-CM | POA: Diagnosis not present

## 2020-07-11 DIAGNOSIS — M545 Low back pain, unspecified: Secondary | ICD-10-CM | POA: Diagnosis not present

## 2020-07-11 NOTE — Progress Notes (Signed)
[facility] [place] Phone: [phone] Subjective:   [name], am serving as a scribe for Dr. Hulan Saas. This visit occurred during the SARS-CoV-2 public health emergency.  Safety protocols were in place, including screening questions prior to the visit, additional usage of staff PPE, and extensive cleaning of exam room while observing appropriate contact time as indicated for disinfecting solutions.   I'm seeing this patient by the request  of:  McLean-Scocuzza, [name], MD  CC: Bilateral knee pain follow-up  [id number]   05/09/2020 Patient given Monovisc injections today.  Tolerated the procedure well.  Chronic problem with exacerbation over the course of time.  He has done this in the past and hopefully that this will make some improvement again.  Patient still wants to hold on any type of surgical intervention if possible.  Follow-up with me again in 2 to 3 months  Update 07/18/2020 JUNO ALERS is a 75 y.o. female coming in with complaint of B knee pain. States that she did not feel that gel worked any better than the steroid. Currently in physical therapy. Does feel that this is helping.   Pain in lower back has worsened. Has to use rollator to get up from supine position when in the bed. After she gets up and moving, pain subsides.  Patient has had known arthritic changes and in 2019 patient was given a epidural, nerve root injection that did not cause significant improvement in pain for quite some time.  Starting to have a very similar presentation she states that it is worsening again.       Past Medical History:  Diagnosis Date  . Abdominal hernia   . Abnormal Pap smear   . ALLERGIC RHINITIS 10/22/2006  . Allergy   . ANEMIA 12/18/2008  . Arthritis    scoliosis moderate deg changes lumbar Xray 11/04/17   . ASYMPTOMATIC POSTMENOPAUSAL STATUS 11/22/2007  . Back pain   . CAD (coronary artery disease)     in LAD  . CHF (congestive heart failure) ([facility])   . Complication of anesthesia   . Degenerative arthritis   . DIABETES MELLITUS, TYPE II 01/13/2007  . Diverticulosis   . Dyslipidemia   . Fibroid   . Frequent headaches   . GERD 07/20/2007  . GOITER, MULTINODULAR 07/20/2007  . Headache(784.0) 07/20/2007  . HEARING LOSS 11/22/2007  . Hiatal hernia   . Hiatal hernia    large  . History of chicken pox   . Hx of colposcopy with cervical biopsy   . HYPERCHOLESTEROLEMIA 01/13/2007  . Hyperglycemia   . HYPERTENSION 10/22/2006  . Lung nodule    unchanged since 05/26/13   . Migraines   . NASH (nonalcoholic steatohepatitis)   . Nocturnal hypoxemia 02/17/2013  . Obesity   . Obstructive sleep apnea   . OSA on CPAP    per neurology  . OSTEOARTHRITIS 10/22/2006  . Other chronic nonalcoholic liver disease 10/18/2991  . Sedimentation rate elevation   . Sleep apnea    on cpap  . Urine incontinence   . UTI (urinary tract infection)    Past Surgical History:  Procedure Laterality Date  . BREAST SURGERY  1984   Breast reduction b/l   . CATARACT EXTRACTION, BILATERAL    . DEXA  08/2005  . DILATION AND CURETTAGE OF UTERUS    . ELECTROCARDIOGRAM  10/15/2006  . ESOPHAGOGASTRODUODENOSCOPY  12/08/2005  . FOOT SURGERY     hammertoe  and bunion 05/2017   . Stress Cardiolite  10/21/2005  . sweat gland removal    . WISDOM TOOTH EXTRACTION     Social History   Socioeconomic History  . Marital status: Married    Spouse name: [name]  . Number of children: 2  . Years of education: College  . Highest education level: Not on file  Occupational History  . Occupation: Retired  Tobacco Use  . Smoking status: Former Smoker    Packs/day: 2.00    Years: 20.00    Pack years: 40.00    Types: Cigarettes    Quit date: 03/03/1979    Years since quitting: 41.4  . Smokeless tobacco: Never Used  Substance and Sexual Activity  . Alcohol use: Yes    Comment: rare  . Drug use: No  . Sexual activity: Yes     Birth control/protection: Surgical  Other Topics Concern  . Not on file  Social History Narrative   Patient is married [name]).   Right Handed   Drinks 1 cup caffeine   Social Determinants of Health   Financial Resource Strain: Low Risk   . Difficulty of Paying Living Expenses: Not hard at all  Food Insecurity: No Food Insecurity  . Worried About Charity fundraiser in the Last Year: Never true  . Ran Out of Food in the Last Year: Never true  Transportation Needs: No Transportation Needs  . Lack of Transportation (Medical): No  . Lack of Transportation (Non-Medical): No  Physical Activity: Not on file  Stress: No Stress Concern Present  . Feeling of Stress : Not at all  Social Connections: Unknown  . Frequency of Communication with Friends and Family: Not on file  . Frequency of Social Gatherings with Friends and Family: Not on file  . Attends Religious Services: Not on file  . Active Member of Clubs or Organizations: Not on file  . Attends Archivist Meetings: Not on file  . Marital Status: Married   Allergies  Allergen Reactions  . Aspirin   . Coconut Oil   . Lisinopril     REACTION: cough  . Metoprolol Itching  . Peanut-Containing Drug Products   . Penicillins     REACTION: rash  . Prednisone   . Strawberry Extract    Family History  Problem Relation Age of Onset  . Asthma Mother   . Depression Mother   . Bipolar disorder Mother   . Dementia Mother   . Arthritis Mother   . Breast cancer Mother        primary  . Colon cancer Mother        mets from breast  . Cancer Mother        colon  . Hypertension Father   . Migraines Father   . Cancer Maternal Aunt        ?  [name] Heart disease Maternal Grandmother   . Cancer Maternal Grandfather        stomach ?     Current Outpatient Medications (Cardiovascular):  .  ezetimibe (ZETIA) 10 MG tablet, Take 1 tablet (10 mg total) by mouth daily. .  furosemide (LASIX) 40 MG tablet, Take 40 mg by mouth  daily. .  rosuvastatin (CRESTOR) 40 MG tablet, Take 1 tablet (40 mg total) by mouth daily. At night  Current Outpatient Medications (Respiratory):  .  cetirizine (ZYRTEC) 10 MG tablet, Take 10 mg by mouth daily.  Current Outpatient Medications (Analgesics):  .  acetaminophen (TYLENOL) 500  MG tablet, Take 650 mg by mouth 3 (three) times daily as needed for pain.  .  traMADol (ULTRAM) 50 MG tablet, Take 1 tablet (50 mg total) by mouth every 12 (twelve) hours as needed.  Current Outpatient Medications (Hematological):  .  clopidogrel (PLAVIX) 75 MG tablet, Take 1 tablet (75 mg total) by mouth daily. Needs appt for future refills .  Cyanocobalamin 1000 MCG CAPS, Take 1 capsule by mouth daily. .  Ferrous Sulfate (IRON) 325 (65 FE) MG TABS, Take 1 tablet by mouth daily.  Current Outpatient Medications (Other):  [name]  COVID-19 mRNA Vac-TriS, Pfizer, (PFIZER-BIONT COVID-19 VAC-TRIS) SUSP injection, Inject into the muscle. .  diclofenac Sodium (VOLTAREN) 1 % GEL, Apply topically 4 (four) times daily. [name]  gabapentin (NEURONTIN) 100 MG capsule, Take 3 pills at bedtime .  glucosamine-chondroitin 500-400 MG tablet, Take 1 tablet by mouth 2 (two) times daily. .  Multiple Vitamin (MULTIVITAMIN) tablet, Take 1 tablet by mouth daily. .  Omega-3 Fatty Acids (FISH OIL) 1000 MG CAPS, Take 1 capsule by mouth daily. .  pantoprazole (PROTONIX) 20 MG tablet, TAKE 1 TABLET DAILY 30     MINUTES BEFORE FOOD        (DISCONTINUE DEXILANT,     DISCONTINUE 40MG DOSE) .  polyethylene glycol (MIRALAX / GLYCOLAX) 17 g packet, Take 17 g by mouth daily as needed. .  potassium chloride SA (KLOR-CON M20) 20 MEQ tablet, Take 1 tablet (20 mEq total) by mouth daily. [name]  pyridOXINE (VITAMIN B-6) 100 MG tablet, Take 200 mg by mouth daily. .  SF 5000 PLUS 1.1 % CREA dental cream,  .  topiramate (TOPAMAX) 25 MG tablet, Take 3 tablets (75 mg total) by mouth at bedtime. .  Turmeric 500 MG CAPS, Take 1 capsule by mouth daily. [name]  UNABLE TO  FIND, Instaflex for knees .  Vitamin D, Ergocalciferol, (DRISDOL) 1.25 MG (50000 UNIT) CAPS capsule, Take 1 capsule (50,000 Units total) by mouth every 7 (seven) days. .  vitamin E 200 UNIT capsule, Take 200 Units by mouth daily.   Reviewed prior external information including notes and imaging from  primary care provider As well as notes that were available from care everywhere and other healthcare systems.  Past medical history, social, surgical and family history all reviewed in electronic medical record.  No pertanent information unless stated regarding to the chief complaint.   Review of Systems:  No headache, visual changes, nausea, vomiting, diarrhea, constipation, dizziness, abdominal pain, skin rash, fevers, chills, night sweats, weight loss, swollen lymph nodes,chest pain, shortness of breath, mood changes. POSITIVE muscle aches, body aches, joint swelling  Objective  Blood pressure 124/70, pulse 63, height 5' 5"  (1.651 m), weight 210 lb (95.3 kg), SpO2 94 %.   General: No apparent distress alert and oriented x3 mood and affect normal, dressed appropriately.  HEENT: Pupils equal, extraocular movements intact  Respiratory: Patient's speak in full sentences and does not appear short of breath  Cardiovascular: Trace lower extremity edema, non tender, no erythema  Gait antalgic with patient having a cane MSK:  Knee: Bilateral valgus deformity noted. Large thigh to calf ratio.  Trace effusion noted Tender to palpation over medial and PF joint line.  ROM lacks 5 degrees of extension and 10 degrees of flexion bilaterally instability with valgus force.  painful patellar compression. Patellar glide with moderate crepitus. Patellar and quadriceps tendons unremarkable. Hamstring and quadriceps strength is normal.  Low back exam loss of lordosis.  Tender to palpation diffusely.  Mild tightness noted with radicular symptoms minorly right greater than left today.  Seems to be in the  L4-L5 distribution.  No significant weakness of the lower extremities.  After informed verbal consent, patient was seated on exam table. Right knee was prepped with alcohol swab and utilizing anterolateral approach, patient's right knee space was injected with 4:1  marcaine 0.5%: Kenalog 76m/dL. Patient tolerated the procedure well without immediate complications.  After informed  verbal consent, patient was seated on exam table. Left knee was prepped with alcohol swab and utilizing anterolateral approach, patient's left knee space was injected with 4:1  marcaine 0.5%: Kenalog 451mdL. Patient tolerated the procedure well without immediate complications.    Impression and Recommendations:     The above documentation has been reviewed and is accurate and complete ZaLyndal PulleyDO

## 2020-07-15 ENCOUNTER — Encounter (INDEPENDENT_AMBULATORY_CARE_PROVIDER_SITE_OTHER): Payer: Self-pay | Admitting: Family Medicine

## 2020-07-16 DIAGNOSIS — M545 Low back pain, unspecified: Secondary | ICD-10-CM | POA: Diagnosis not present

## 2020-07-16 DIAGNOSIS — M25561 Pain in right knee: Secondary | ICD-10-CM | POA: Diagnosis not present

## 2020-07-16 DIAGNOSIS — R531 Weakness: Secondary | ICD-10-CM | POA: Diagnosis not present

## 2020-07-16 DIAGNOSIS — M25562 Pain in left knee: Secondary | ICD-10-CM | POA: Diagnosis not present

## 2020-07-17 ENCOUNTER — Other Ambulatory Visit (HOSPITAL_COMMUNITY): Payer: Self-pay | Admitting: Cardiology

## 2020-07-17 ENCOUNTER — Ambulatory Visit (HOSPITAL_COMMUNITY)
Admission: RE | Admit: 2020-07-17 | Discharge: 2020-07-17 | Disposition: A | Discharge status: Home / Self Care | Payer: Medicare Other | Source: Ambulatory Visit | Attending: Internal Medicine | Admitting: Internal Medicine

## 2020-07-17 ENCOUNTER — Other Ambulatory Visit (HOSPITAL_COMMUNITY): Payer: Medicare Other

## 2020-07-17 ENCOUNTER — Other Ambulatory Visit: Payer: Self-pay

## 2020-07-17 DIAGNOSIS — M25561 Pain in right knee: Secondary | ICD-10-CM | POA: Diagnosis not present

## 2020-07-17 DIAGNOSIS — R609 Edema, unspecified: Secondary | ICD-10-CM

## 2020-07-17 DIAGNOSIS — I10 Essential (primary) hypertension: Secondary | ICD-10-CM | POA: Insufficient documentation

## 2020-07-17 DIAGNOSIS — M25562 Pain in left knee: Secondary | ICD-10-CM | POA: Diagnosis not present

## 2020-07-17 DIAGNOSIS — M545 Low back pain, unspecified: Secondary | ICD-10-CM | POA: Diagnosis not present

## 2020-07-17 DIAGNOSIS — R531 Weakness: Secondary | ICD-10-CM | POA: Diagnosis not present

## 2020-07-17 LAB — LIPID PANEL
Cholesterol: 142 mg/dL (ref 0–200)
HDL: 67 mg/dL (ref >40)
LDL Cholesterol: 65 mg/dL (ref 0–99)
Total CHOL/HDL Ratio: 2.1 RATIO
Triglycerides: 50 mg/dL (ref <150)
VLDL: 10 mg/dL (ref 0–40)

## 2020-07-17 NOTE — Progress Notes (Signed)
l °

## 2020-07-18 ENCOUNTER — Ambulatory Visit (INDEPENDENT_AMBULATORY_CARE_PROVIDER_SITE_OTHER): Payer: Medicare Other

## 2020-07-18 ENCOUNTER — Other Ambulatory Visit: Payer: Self-pay

## 2020-07-18 ENCOUNTER — Telehealth: Payer: Self-pay

## 2020-07-18 ENCOUNTER — Ambulatory Visit (INDEPENDENT_AMBULATORY_CARE_PROVIDER_SITE_OTHER): Payer: Medicare Other | Admitting: Family Medicine

## 2020-07-18 ENCOUNTER — Encounter: Payer: Self-pay | Admitting: Family Medicine

## 2020-07-18 ENCOUNTER — Other Ambulatory Visit: Payer: Self-pay | Admitting: Cardiology

## 2020-07-18 VITALS — BP 124/70 | HR 63 | Ht 65.0 in | Wt 210.0 lb

## 2020-07-18 DIAGNOSIS — M545 Low back pain, unspecified: Secondary | ICD-10-CM

## 2020-07-18 DIAGNOSIS — M5136 Other intervertebral disc degeneration, lumbar region: Secondary | ICD-10-CM

## 2020-07-18 DIAGNOSIS — M170 Bilateral primary osteoarthritis of knee: Secondary | ICD-10-CM

## 2020-07-18 DIAGNOSIS — G8929 Other chronic pain: Secondary | ICD-10-CM

## 2020-07-18 DIAGNOSIS — M25562 Pain in left knee: Secondary | ICD-10-CM | POA: Diagnosis not present

## 2020-07-18 DIAGNOSIS — I2510 Atherosclerotic heart disease of native coronary artery without angina pectoris: Secondary | ICD-10-CM

## 2020-07-18 DIAGNOSIS — M25561 Pain in right knee: Secondary | ICD-10-CM | POA: Diagnosis not present

## 2020-07-18 DIAGNOSIS — R531 Weakness: Secondary | ICD-10-CM | POA: Diagnosis not present

## 2020-07-18 NOTE — Assessment & Plan Note (Signed)
Patient feels that the viscosupplementation did not make any significant improvement at this time.  Was discussed with patient icing regimen and home exercises.  Increase activity slowly.  Patient has done well with the weight loss.  Did given a handout to discuss potential PRP.  Patient still wants to avoid any surgical intervention.  Discussed posture and ergonomics.  Follow-up with me again in 2 to 3 months

## 2020-07-18 NOTE — Telephone Encounter (Signed)
   Primary Cardiologist: [name], MD  Chart reviewed as part of pre-operative protocol coverage. Given past medical history and time since last visit, based on ACC/AHA guidelines, Heather [name] would be at acceptable risk for the planned procedure without further cardiovascular testing.   Her Plavix may be held for 5 to 7 days prior to the procedure.  Please resume as soon as hemostasis is achieved.  I will route this recommendation to the requesting party via Epic fax function and remove from pre-op pool.  Please call with questions.  Jossie Ng. Prynce Jacober NP-C    07/18/2020, 5:30 PM [place] [address] Office [phone] Fax [phone]

## 2020-07-18 NOTE — Telephone Encounter (Signed)
   [facility] Pre-operative Risk Assessment    Patient Name: Heather [name]  DOB: 29-Sep-1945  MRN: [id number]   HEARTCARE STAFF: - Please ensure there is not already an duplicate clearance open for this procedure. - Under Visit Info/Reason for Call, type in Other and utilize the format Clearance MM/DD/YY or Clearance TBD. Do not use dashes or single digits. - If request is for dental extraction, please clarify the # of teeth to be extracted.  Request for surgical clearance:  1. What type of surgery is being performed? LUMBER EPIDURAL   2. When is this surgery scheduled? TBD   3. What type of clearance is required (medical clearance vs. Pharmacy clearance to hold med vs. Both)? PHARMACY  4. Are there any medications that need to be held prior to surgery and how long?PLAVIX X 5 DAYS   5. Practice name and name of physician performing surgery? [facility]   6. What is the office phone number? [phone]   7.   What is the office fax number? [phone]  8.   Anesthesia type (None, local, MAC, general) ? LOCAL   Jacinta Shoe 07/18/2020, 5:04 PM  _________________________________________________________________   (provider comments below)

## 2020-07-18 NOTE — Assessment & Plan Note (Signed)
Known arthritic changes.  Patient did respond very well to a epidural more than 2 years ago.  At this point I would like to repeat the injection.  We will see how patient responds.  We will get new x-rays today as well to further evaluate for any progression of the arthritis.  Patient will follow up with me again 4 weeks after the injection

## 2020-07-18 NOTE — Patient Instructions (Signed)
Good to see you Epidural L4-L5 Read PRP handout Back xray today See me again in 2 -3 months

## 2020-07-22 ENCOUNTER — Ambulatory Visit (INDEPENDENT_AMBULATORY_CARE_PROVIDER_SITE_OTHER): Payer: Medicare Other | Admitting: Family Medicine

## 2020-07-22 DIAGNOSIS — R928 Other abnormal and inconclusive findings on diagnostic imaging of breast: Secondary | ICD-10-CM | POA: Diagnosis not present

## 2020-07-22 DIAGNOSIS — M810 Age-related osteoporosis without current pathological fracture: Secondary | ICD-10-CM | POA: Diagnosis not present

## 2020-07-22 DIAGNOSIS — R921 Mammographic calcification found on diagnostic imaging of breast: Secondary | ICD-10-CM | POA: Diagnosis not present

## 2020-07-22 DIAGNOSIS — Z01419 Encounter for gynecological examination (general) (routine) without abnormal findings: Secondary | ICD-10-CM | POA: Diagnosis not present

## 2020-07-22 DIAGNOSIS — R32 Unspecified urinary incontinence: Secondary | ICD-10-CM | POA: Diagnosis not present

## 2020-07-22 LAB — HM MAMMOGRAPHY

## 2020-07-23 DIAGNOSIS — E119 Type 2 diabetes mellitus without complications: Secondary | ICD-10-CM | POA: Diagnosis not present

## 2020-07-23 LAB — HM DIABETES EYE EXAM

## 2020-07-24 DIAGNOSIS — R531 Weakness: Secondary | ICD-10-CM | POA: Diagnosis not present

## 2020-07-24 DIAGNOSIS — M25561 Pain in right knee: Secondary | ICD-10-CM | POA: Diagnosis not present

## 2020-07-24 DIAGNOSIS — M25562 Pain in left knee: Secondary | ICD-10-CM | POA: Diagnosis not present

## 2020-07-24 DIAGNOSIS — M545 Low back pain, unspecified: Secondary | ICD-10-CM | POA: Diagnosis not present

## 2020-07-25 ENCOUNTER — Other Ambulatory Visit: Payer: Self-pay | Admitting: Cardiology

## 2020-07-26 DIAGNOSIS — M25561 Pain in right knee: Secondary | ICD-10-CM | POA: Diagnosis not present

## 2020-07-26 DIAGNOSIS — R531 Weakness: Secondary | ICD-10-CM | POA: Diagnosis not present

## 2020-07-26 DIAGNOSIS — M25562 Pain in left knee: Secondary | ICD-10-CM | POA: Diagnosis not present

## 2020-07-26 DIAGNOSIS — M545 Low back pain, unspecified: Secondary | ICD-10-CM | POA: Diagnosis not present

## 2020-07-28 ENCOUNTER — Other Ambulatory Visit: Payer: Self-pay | Admitting: Internal Medicine

## 2020-07-30 DIAGNOSIS — M545 Low back pain, unspecified: Secondary | ICD-10-CM | POA: Diagnosis not present

## 2020-07-30 DIAGNOSIS — M25562 Pain in left knee: Secondary | ICD-10-CM | POA: Diagnosis not present

## 2020-07-30 DIAGNOSIS — M25561 Pain in right knee: Secondary | ICD-10-CM | POA: Diagnosis not present

## 2020-07-30 DIAGNOSIS — R531 Weakness: Secondary | ICD-10-CM | POA: Diagnosis not present

## 2020-07-31 ENCOUNTER — Ambulatory Visit
Admission: RE | Admit: 2020-07-31 | Discharge: 2020-07-31 | Disposition: A | Discharge status: Home / Self Care | Payer: Medicare Other | Source: Ambulatory Visit | Attending: Family Medicine | Admitting: Family Medicine

## 2020-07-31 DIAGNOSIS — G8929 Other chronic pain: Secondary | ICD-10-CM

## 2020-07-31 DIAGNOSIS — M47817 Spondylosis without myelopathy or radiculopathy, lumbosacral region: Secondary | ICD-10-CM | POA: Diagnosis not present

## 2020-07-31 MED ORDER — METHYLPREDNISOLONE ACETATE 40 MG/ML INJ SUSP (RADIOLOG
80.0000 mg | Freq: Once | INTRAMUSCULAR | Status: AC
  Administered 2020-07-31: 80 mg via EPIDURAL

## 2020-07-31 MED ORDER — IOPAMIDOL (ISOVUE-M 200) INJECTION 41%
1.0000 mL | Freq: Once | INTRAMUSCULAR | Status: AC
  Administered 2020-07-31: 1 mL via EPIDURAL

## 2020-07-31 NOTE — Discharge Instructions (Signed)
Post Procedure Spinal Discharge Instruction Sheet  1. You may resume a regular diet and any medications that you routinely take (including pain medications).  2. No driving day of procedure.  3. Light activity throughout the rest of the day.  Do not do any strenuous work, exercise, bending or lifting.  The day following the procedure, you can resume normal physical activity but you should refrain from exercising or physical therapy for at least three days thereafter.   Common Side Effects:   Headaches- take your usual medications as directed by your physician.  Increase your fluid intake.  Caffeinated beverages may be helpful.  Lie flat in bed until your headache resolves.   Restlessness or inability to sleep- you may have trouble sleeping for the next few days.  Ask your referring physician if you need any medication for sleep.   Facial flushing or redness- should subside within a few days.   Increased pain- a temporary increase in pain a day or two following your procedure is not unusual.  Take your pain medication as prescribed by your referring physician.   Leg cramps  Please contact our office at [phone] for the following symptoms:  Fever greater than 100 degrees.  Headaches unresolved with medication after 2-3 days.  Increased swelling, pain, or redness at injection site.   Thank you for visiting [facility] today.   YOU MAY RESUME YOUR PLAVIX ANYTIME AFTER INJECTION TODAY 07/31/20

## 2020-08-01 ENCOUNTER — Other Ambulatory Visit: Payer: Self-pay

## 2020-08-01 ENCOUNTER — Encounter (INDEPENDENT_AMBULATORY_CARE_PROVIDER_SITE_OTHER): Payer: Self-pay | Admitting: Family Medicine

## 2020-08-01 ENCOUNTER — Ambulatory Visit (INDEPENDENT_AMBULATORY_CARE_PROVIDER_SITE_OTHER): Payer: Medicare Other | Admitting: Family Medicine

## 2020-08-01 VITALS — BP 116/66 | HR 64 | Temp 98.5°F | Ht 65.0 in | Wt 203.0 lb

## 2020-08-01 DIAGNOSIS — E559 Vitamin D deficiency, unspecified: Secondary | ICD-10-CM | POA: Diagnosis not present

## 2020-08-01 DIAGNOSIS — I2510 Atherosclerotic heart disease of native coronary artery without angina pectoris: Secondary | ICD-10-CM

## 2020-08-01 DIAGNOSIS — E119 Type 2 diabetes mellitus without complications: Secondary | ICD-10-CM

## 2020-08-01 DIAGNOSIS — Z6837 Body mass index (BMI) 37.0-37.9, adult: Secondary | ICD-10-CM | POA: Diagnosis not present

## 2020-08-01 MED ORDER — VITAMIN D (ERGOCALCIFEROL) 1.25 MG (50000 UNIT) PO CAPS: 50000.0000 [IU] | ORAL_CAPSULE | ORAL | 0 refills | Status: DC

## 2020-08-06 ENCOUNTER — Encounter: Payer: Self-pay | Admitting: Internal Medicine

## 2020-08-06 DIAGNOSIS — M25562 Pain in left knee: Secondary | ICD-10-CM | POA: Diagnosis not present

## 2020-08-06 DIAGNOSIS — R531 Weakness: Secondary | ICD-10-CM | POA: Diagnosis not present

## 2020-08-06 DIAGNOSIS — M545 Low back pain, unspecified: Secondary | ICD-10-CM | POA: Diagnosis not present

## 2020-08-06 DIAGNOSIS — M25561 Pain in right knee: Secondary | ICD-10-CM | POA: Diagnosis not present

## 2020-08-07 NOTE — Progress Notes (Signed)
Chief Complaint:   OBESITY Heather [name] is here to discuss her progress with her obesity treatment plan along with follow-up of her obesity related diagnoses. Heather [name] is on the Category 2 Plan and keeping a food journal and adhering to recommended goals of 1200 calories and 75+ g protein and states she is following her eating plan approximately ?% of the time. Heather [name] states she is doing PT 60 minutes 2 times per week.  Today's visit was #: 26 Starting weight: 268 lbs Starting date: 12/23/2016 Today's weight: 204 lbs Today's date: 08/01/2020 Total lbs lost to date: 10 Total lbs lost since last in-office visit: 0  Interim History: Heather [name] has had a good deal going on- lots of appts, housework/renovations. The first 3 weeks, she was on plan but after girl scout camping trip, she was less compliant. She has also had increased pain and had a procedure. She feels choices are different since being in the program.  Subjective:   1. Vitamin D deficiency Heather [name] denies nausea, vomiting, and muscle weakness but notes fatigue. Pt is on prescription Vit D.  2. Type 2 diabetes mellitus without complication, without long-term current use of insulin (HCC) Heather [name]'s last A1c was 5.9. She is not on medication.  Assessment/Plan:   1. Vitamin D deficiency Low Vitamin D level contributes to fatigue and are associated with obesity, breast, and colon cancer. She agrees to continue to take prescription Vitamin D @50 ,000 IU every week and will follow-up for routine testing of Vitamin D, at least 2-3 times per year to avoid over-replacement.  - Vitamin D, Ergocalciferol, (DRISDOL) 1.25 MG (50000 UNIT) CAPS capsule; Take 1 capsule (50,000 Units total) by mouth every 7 (seven) days.  Dispense: 4 capsule; Refill: 0  2. Type 2 diabetes mellitus without complication, without long-term current use of insulin (HCC) Good blood sugar control is important to decrease the likelihood of diabetic complications such as nephropathy,  neuropathy, limb loss, blindness, coronary artery disease, and death. Intensive lifestyle modification including diet, exercise and weight loss are the first line of treatment for diabetes. Continue category 2/journaling. Repeat labs in 3 months.  3. Class 2 severe obesity with serious comorbidity and body mass index (BMI) of 37.0 to 37.9 in adult, unspecified obesity type (Heather [name])  Heather [name] is currently in the action stage of change. As such, her goal is to continue with weight loss efforts. She has agreed to the Category 2 Plan and keeping a food journal and adhering to recommended goals of 1200 calories and 75+ g protein.   Exercise goals: All adults should avoid inactivity. Some physical activity is better than none, and adults who participate in any amount of physical activity gain some health benefits.  Behavioral modification strategies: increasing lean protein intake, meal planning and cooking strategies, keeping healthy foods in the home and planning for success.  Heather [name] has agreed to follow-up with our clinic in 3 weeks. She was informed of the importance of frequent follow-up visits to maximize her success with intensive lifestyle modifications for her multiple health conditions.   Objective:   Blood pressure 116/66, pulse 64, temperature 98.5 F (36.9 C), height 5' 5"  (1.651 m), weight 203 lb (92.1 kg), SpO2 97 %. Body mass index is 33.78 kg/m.  General: Cooperative, alert, well developed, in no acute distress. HEENT: Conjunctivae and lids unremarkable. Cardiovascular: Regular rhythm.  Lungs: Normal work of breathing. Neurologic: No focal deficits.   Lab Results  Component Value Date   CREATININE 0.82 06/21/2020   BUN  20 06/21/2020   NA 142 06/21/2020   K 3.5 06/21/2020   CL 107 06/21/2020   CO2 28 06/21/2020   Lab Results  Component Value Date   ALT 15 06/21/2020   AST 18 06/21/2020   ALKPHOS 51 06/21/2020   BILITOT 0.3 06/21/2020   Lab Results  Component Value Date    HGBA1C 5.9 06/21/2020   HGBA1C 5.8 (H) 04/30/2020   HGBA1C 5.6 10/09/2019   HGBA1C 5.8 (H) 07/05/2019   HGBA1C 5.6 12/05/2018   Lab Results  Component Value Date   INSULIN 4.3 10/09/2019   INSULIN 8.6 07/05/2019   INSULIN 6.4 12/05/2018   INSULIN 3.6 04/27/2018   INSULIN 5.6 12/01/2017   Lab Results  Component Value Date   TSH 2.23 06/21/2020   Lab Results  Component Value Date   CHOL 142 07/17/2020   HDL 67 07/17/2020   LDLCALC 65 07/17/2020   TRIG 50 07/17/2020   CHOLHDL 2.1 07/17/2020   Lab Results  Component Value Date   WBC 2.8 (L) 06/21/2020   WBC 2.9 (L) 06/21/2020   HGB 13.2 06/21/2020   HGB 13.4 06/21/2020   HCT 39.5 06/21/2020   HCT 42.0 06/21/2020   MCV 94.9 06/21/2020   MCV 96 06/21/2020   PLT 167.0 06/21/2020   PLT 179 06/21/2020   Lab Results  Component Value Date   IRON 91 12/07/2019   TIBC 255 12/07/2019   FERRITIN 147 12/07/2019    Obesity Behavioral Intervention:   Approximately 15 minutes were spent on the discussion below.  ASK: We discussed the diagnosis of obesity with Heather [name] today and Heather [name] agreed to give [name] permission to discuss obesity behavioral modification therapy today.  ASSESS: Heather [name] has the diagnosis of obesity and her BMI today is 34.0. Heather [name] is in the action stage of change.   ADVISE: Heather [name] was educated on the multiple health risks of obesity as well as the benefit of weight loss to improve her health. She was advised of the need for long term treatment and the importance of lifestyle modifications to improve her current health and to decrease her risk of future health problems.  AGREE: Multiple dietary modification options and treatment options were discussed and Heather [name] agreed to follow the recommendations documented in the above note.  ARRANGE: Heather [name] was educated on the importance of frequent visits to treat obesity as outlined per CMS and USPSTF guidelines and agreed to schedule her next follow up appointment  today.  Attestation Statements:   Reviewed by clinician on day of visit: allergies, medications, problem list, medical history, surgical history, family history, social history, and previous encounter notes.  Coral Ceo, CMA, am acting as transcriptionist for Coralie Common, MD.   I have reviewed the above documentation for accuracy and completeness, and I agree with the above. - Jinny Blossom, MD

## 2020-08-09 DIAGNOSIS — M545 Low back pain, unspecified: Secondary | ICD-10-CM | POA: Diagnosis not present

## 2020-08-09 DIAGNOSIS — R531 Weakness: Secondary | ICD-10-CM | POA: Diagnosis not present

## 2020-08-09 DIAGNOSIS — M25562 Pain in left knee: Secondary | ICD-10-CM | POA: Diagnosis not present

## 2020-08-09 DIAGNOSIS — M25561 Pain in right knee: Secondary | ICD-10-CM | POA: Diagnosis not present

## 2020-08-12 ENCOUNTER — Encounter: Payer: Self-pay | Admitting: Neurology

## 2020-08-12 ENCOUNTER — Ambulatory Visit (INDEPENDENT_AMBULATORY_CARE_PROVIDER_SITE_OTHER): Payer: Medicare Other | Admitting: Neurology

## 2020-08-12 ENCOUNTER — Encounter: Payer: Self-pay | Admitting: Family Medicine

## 2020-08-12 ENCOUNTER — Encounter: Payer: Self-pay | Admitting: Internal Medicine

## 2020-08-12 VITALS — BP 112/71 | HR 59 | Ht 65.0 in | Wt 203.0 lb

## 2020-08-12 DIAGNOSIS — G43009 Migraine without aura, not intractable, without status migrainosus: Secondary | ICD-10-CM

## 2020-08-12 DIAGNOSIS — R413 Other amnesia: Secondary | ICD-10-CM | POA: Diagnosis not present

## 2020-08-12 MED ORDER — TOPIRAMATE 25 MG PO TABS: 25.0000 mg | ORAL_TABLET | Freq: Every day | ORAL | 3 refills | Status: DC

## 2020-08-12 NOTE — Patient Instructions (Signed)
Things look good today  Keep appointment in in Nov Continue to use CPAP

## 2020-08-12 NOTE — Progress Notes (Signed)
PATIENT: Heather [name] DOB: 06/18/1945  REASON FOR VISIT: follow up HISTORY FROM: patient Primary Neurologist:  HISTORY OF PRESENT ILLNESS: Today 08/12/20 Heather [name] is a 75 year old female with history of memory issues.  Is on CPAP.  Has been on Topamax for migraines, was able to reduce the Topamax from 75 mg to 25 mg daily.  MRI of the brain is unremarkable. MMSE 30/30 today. No change in memory since Topamax dose reduction. No longer reports severe migraines, only mild headaches, doing well with this. Sometimes will have episodes of wooziness, but if she takes an sip of something to drink resolves. These are not intense. Most issue is word finding, is picky with finding the right word. Hard to identify actors on TV. She drives a car, is still highly functional. Bothers her can't do what she once did, remembering miscellaneous useless facts. In PT, walked in without cane today. Having cataract surgery soon. Has had recent Lumbar ESI.  HISTORY  02/08/2020 Dr. Jannifer Franklin: Heather [name] is a 75 year old right-handed black female with a history of sleep apnea and diabetes.  The patient is on CPAP.  She reports a 5 to 6-year history of some memory issues that have gradually changed over time.  She mainly reports difficulty with word finding, and difficulty remembering names for people.  She has a history of migraine headaches, she has been on Topamax for a number of years but seems to tolerate this fairly well.  She currently is on 100 mg daily of Topamax.  She reports no problems with driving.  She is able to keep up with her medications and appointments fairly well, but occasionally she will get mixed up on dosing of medications.  The patient reports that she sleeps well at night and has a good energy level during the day.  She is doing quite well with her migraines, she only has an occasional headache.  She reports that her mother also had dementia but she had bipolar disorder as well.  The patient  is sent to this office for further evaluation.  REVIEW OF SYSTEMS: Out of a complete 14 system review of symptoms, the patient complains only of the following symptoms, and all other reviewed systems are negative.  Memory issues  ALLERGIES: Allergies  Allergen Reactions   Aspirin    Coconut Oil    Lisinopril     REACTION: cough   Metoprolol Itching   Peanut-Containing Drug Products    Penicillins     REACTION: rash   Prednisone    Strawberry Extract     HOME MEDICATIONS: Outpatient Medications Prior to Visit  Medication Sig Dispense Refill   acetaminophen (TYLENOL) 500 MG tablet Take 650 mg by mouth 3 (three) times daily as needed for pain.      cetirizine (ZYRTEC) 10 MG tablet Take 10 mg by mouth daily.     clopidogrel (PLAVIX) 75 MG tablet Take 1 tablet (75 mg total) by mouth daily. Needs appt for future refills 90 tablet 3   COVID-19 mRNA Vac-TriS, Pfizer, (PFIZER-BIONT COVID-19 VAC-TRIS) SUSP injection Inject into the muscle. 0.3 mL 0   Cyanocobalamin 1000 MCG CAPS Take 1 capsule by mouth daily.     diclofenac Sodium (VOLTAREN) 1 % GEL Apply topically 4 (four) times daily.     ezetimibe (ZETIA) 10 MG tablet Take 1 tablet (10 mg total) by mouth daily. 90 tablet 3   Ferrous Sulfate (IRON) 325 (65 FE) MG TABS Take 1 tablet by mouth daily.  furosemide (LASIX) 40 MG tablet TAKE 1 TABLET DAILY AS     NEEDED 90 tablet 1   gabapentin (NEURONTIN) 100 MG capsule Take 3 pills at bedtime 270 capsule 3   glucosamine-chondroitin 500-400 MG tablet Take 1 tablet by mouth 2 (two) times daily.     Multiple Vitamin (MULTIVITAMIN) tablet Take 1 tablet by mouth daily.     Omega-3 Fatty Acids (FISH OIL) 1000 MG CAPS Take 1 capsule by mouth daily.     pantoprazole (PROTONIX) 20 MG tablet TAKE 1 TABLET DAILY 30     MINUTES BEFORE FOOD        (DISCONTINUE DEXILANT,     DISCONTINUE 40MG DOSE) 90 tablet 3   polyethylene glycol (MIRALAX / GLYCOLAX) 17 g packet Take 17 g by mouth daily as needed.      potassium chloride SA (KLOR-CON M20) 20 MEQ tablet Take 1 tablet (20 mEq total) by mouth daily. 90 tablet 3   pyridOXINE (VITAMIN B-6) 100 MG tablet Take 200 mg by mouth daily.     rosuvastatin (CRESTOR) 40 MG tablet Take 1 tablet (40 mg total) by mouth daily. At night 90 tablet 3   SF 5000 PLUS 1.1 % CREA dental cream   0   topiramate (TOPAMAX) 25 MG tablet Take 3 tablets (75 mg total) by mouth at bedtime. 270 tablet 1   traMADol (ULTRAM) 50 MG tablet Take 1 tablet (50 mg total) by mouth every 12 (twelve) hours as needed. 10 tablet 0   Turmeric 500 MG CAPS Take 1 capsule by mouth daily.     UNABLE TO FIND Instaflex for knees     Vitamin D, Ergocalciferol, (DRISDOL) 1.25 MG (50000 UNIT) CAPS capsule Take 1 capsule (50,000 Units total) by mouth every 7 (seven) days. 4 capsule 0   vitamin E 200 UNIT capsule Take 200 Units by mouth daily.     No facility-administered medications prior to visit.    PAST MEDICAL HISTORY: Past Medical History:  Diagnosis Date   Abdominal hernia    Abnormal Pap smear    ALLERGIC RHINITIS 10/22/2006   Allergy    ANEMIA 12/18/2008   Arthritis    scoliosis moderate deg changes lumbar Xray 11/04/17    ASYMPTOMATIC POSTMENOPAUSAL STATUS 11/22/2007   Back pain    CAD (coronary artery disease)    in LAD   CHF (congestive heart failure) (HCC)    Complication of anesthesia    Degenerative arthritis    DIABETES MELLITUS, TYPE II 01/13/2007   Diverticulosis    Dyslipidemia    Fibroid    Frequent headaches    GERD 07/20/2007   GOITER, MULTINODULAR 07/20/2007   Headache(784.0) 07/20/2007   HEARING LOSS 11/22/2007   Hiatal hernia    Hiatal hernia    large   History of chicken pox    Hx of colposcopy with cervical biopsy    HYPERCHOLESTEROLEMIA 01/13/2007   Hyperglycemia    HYPERTENSION 10/22/2006   Lung nodule    unchanged since 05/26/13    Migraines    NASH (nonalcoholic steatohepatitis)    Nocturnal hypoxemia 02/17/2013   Obesity    Obstructive sleep  apnea    OSA on CPAP    per neurology   OSTEOARTHRITIS 10/22/2006   Other chronic nonalcoholic liver disease 1/75/1025   Sedimentation rate elevation    Sleep apnea    on cpap   Urine incontinence    UTI (urinary tract infection)     PAST SURGICAL HISTORY: Past Surgical History:  Procedure Laterality Date   BREAST SURGERY  1984   Breast reduction b/l    CATARACT EXTRACTION, BILATERAL     DEXA  08/2005   DILATION AND CURETTAGE OF UTERUS     ELECTROCARDIOGRAM  10/15/2006   ESOPHAGOGASTRODUODENOSCOPY  12/08/2005   FOOT SURGERY     hammertoe and bunion 05/2017    Stress Cardiolite  10/21/2005   sweat gland removal     WISDOM TOOTH EXTRACTION      FAMILY HISTORY: Family History  Problem Relation Age of Onset   Asthma Mother    Depression Mother    Bipolar disorder Mother    Dementia Mother    Arthritis Mother    Breast cancer Mother        primary   Colon cancer Mother        mets from breast   Cancer Mother        colon   Hypertension Father    Migraines Father    Cancer Maternal Aunt        ?   Heart disease Maternal Grandmother    Cancer Maternal Grandfather        stomach ?    SOCIAL HISTORY: Social History   Socioeconomic History   Marital status: Married    Spouse name: [name]   Number of children: 2   Years of education: College   Highest education level: Not on file  Occupational History   Occupation: Retired  Tobacco Use   Smoking status: Former    Packs/day: 2.00    Years: 20.00    Pack years: 40.00    Types: Cigarettes    Quit date: 03/03/1979    Years since quitting: 41.4   Smokeless tobacco: Never  Substance and Sexual Activity   Alcohol use: Yes    Comment: rare   Drug use: No   Sexual activity: Yes    Birth control/protection: Surgical  Other Topics Concern   Not on file  Social History Narrative   Patient is married [name]).   Right Handed   Drinks 1 cup caffeine   Social Determinants of Health   Financial Resource Strain:  Low Risk    Difficulty of Paying Living Expenses: Not hard at all  Food Insecurity: No Food Insecurity   Worried About Charity fundraiser in the Last Year: Never true   Arboriculturist in the Last Year: Never true  Transportation Needs: No Transportation Needs   Lack of Transportation (Medical): No   Lack of Transportation (Non-Medical): No  Physical Activity: Not on file  Stress: No Stress Concern Present   Feeling of Stress : Not at all  Social Connections: Unknown   Frequency of Communication with Friends and Family: Not on file   Frequency of Social Gatherings with Friends and Family: Not on file   Attends Religious Services: Not on file   Active Member of Clubs or Organizations: Not on file   Attends Archivist Meetings: Not on file   Marital Status: Married  Intimate Partner Violence: Not At Risk   Fear of Current or Ex-Partner: No   Emotionally Abused: No   Physically Abused: No   Sexually Abused: No   PHYSICAL EXAM  Vitals:   08/12/20 1020  BP: 112/71  Pulse: (!) 59  Weight: 203 lb (92.1 kg)  Height: 5' 5"  (1.651 m)   Body mass index is 33.78 kg/m.  Generalized: Well developed, in no acute distress  MMSE - Mini Mental  State Exam 08/12/2020 02/08/2020  Orientation to time 5 5  Orientation to Place 5 5  Registration 3 3  Attention/ Calculation 5 5  Recall 3 3  Language- name 2 objects 2 2  Language- repeat 1 1  Language- follow 3 step command 3 3  Language- read & follow direction 1 1  Write a sentence 1 1  Copy design 1 1  Total score 30 30    Neurological examination  Mentation: Alert oriented to time, place, history taking. Follows all commands speech and language fluent Cranial nerve II-XII: Pupils were equal round reactive to light. Extraocular movements were full, visual field were full on confrontational test. Facial sensation and strength were normal. Head turning and shoulder shrug  were normal and symmetric. Motor:  5/5 strength of all  extremities, exception 4/5 bilateral hip flexion Sensory: Sensory testing is intact to soft touch on all 4 extremities. No evidence of extinction is noted.  Coordination: Cerebellar testing reveals good finger-nose-finger and heel-to-shin bilaterally.  Gait and station: Gait is slightly wide-based, walks independently, overall steady Reflexes: Deep tendon reflexes are symmetric but depressed throughout  DIAGNOSTIC DATA (LABS, IMAGING, TESTING) - I reviewed patient records, labs, notes, testing and imaging myself where available.  Lab Results  Component Value Date   WBC 2.8 (L) 06/21/2020   WBC 2.9 (L) 06/21/2020   HGB 13.2 06/21/2020   HGB 13.4 06/21/2020   HCT 39.5 06/21/2020   HCT 42.0 06/21/2020   MCV 94.9 06/21/2020   MCV 96 06/21/2020   PLT 167.0 06/21/2020   PLT 179 06/21/2020      Component Value Date/Time   NA 142 06/21/2020 0808   NA 141 04/30/2020 1048   K 3.5 06/21/2020 0808   CL 107 06/21/2020 0808   CO2 28 06/21/2020 0808   GLUCOSE 81 06/21/2020 0808   BUN 20 06/21/2020 0808   BUN 17 04/30/2020 1048   CREATININE 0.82 06/21/2020 0808   CREATININE 0.80 12/07/2019 1246   CREATININE 0.81 12/28/2011 0853   CALCIUM 9.0 06/21/2020 0808   PROT 7.0 06/21/2020 0808   PROT 6.8 04/30/2020 1048   ALBUMIN 3.8 06/21/2020 0808   ALBUMIN 4.0 04/30/2020 1048   AST 18 06/21/2020 0808   AST 19 12/07/2019 1246   ALT 15 06/21/2020 0808   ALT 20 12/07/2019 1246   ALKPHOS 51 06/21/2020 0808   BILITOT 0.3 06/21/2020 0808   BILITOT 0.2 04/30/2020 1048   BILITOT 0.3 12/07/2019 1246   GFRNONAA >60 12/07/2019 1246   GFRAA 100 10/09/2019 1425   Lab Results  Component Value Date   CHOL 142 07/17/2020   HDL 67 07/17/2020   LDLCALC 65 07/17/2020   TRIG 50 07/17/2020   CHOLHDL 2.1 07/17/2020   Lab Results  Component Value Date   HGBA1C 5.9 06/21/2020   Lab Results  Component Value Date   VITAMINB12 3,179 (H) 12/07/2019   Lab Results  Component Value Date   TSH 2.23  06/21/2020   ASSESSMENT AND PLAN 75 y.o. year old female  has a past medical history of Abdominal hernia, Abnormal Pap smear, ALLERGIC RHINITIS (10/22/2006), Allergy, ANEMIA (12/18/2008), Arthritis, ASYMPTOMATIC POSTMENOPAUSAL STATUS (11/22/2007), Back pain, CAD (coronary artery disease), CHF (congestive heart failure) ([place]), Complication of anesthesia, Degenerative arthritis, DIABETES MELLITUS, TYPE II (01/13/2007), Diverticulosis, Dyslipidemia, Fibroid, Frequent headaches, GERD (07/20/2007), GOITER, MULTINODULAR (07/20/2007), Headache(784.0) (07/20/2007), HEARING LOSS (11/22/2007), Hiatal hernia, Hiatal hernia, History of chicken pox, colposcopy with cervical biopsy, HYPERCHOLESTEROLEMIA (01/13/2007), Hyperglycemia, HYPERTENSION (10/22/2006), Lung nodule, Migraines, NASH (nonalcoholic  steatohepatitis), Nocturnal hypoxemia (02/17/2013), Obesity, Obstructive sleep apnea, OSA on CPAP, OSTEOARTHRITIS (10/22/2006), Other chronic nonalcoholic liver disease (04/25/16), Sedimentation rate elevation, Sleep apnea, Urine incontinence, and UTI (urinary tract infection). here with:  Reported memory disturbance, mild cognitive impairment Migraine headache  -Stable, MMSE 30/30 -MRI of the brain was unremarkable -Headaches remain well controlled on low-dose Topamax 25 mg daily, but no change in cognitive function with lower dose -Will continue to follow memory over time, she remains highly functional, may consider the use of memory medications in the future -Continue CPAP use, discussed follow-up in November, will try to consolidate care, could even follow memory annually  Butler Denmark, AGNP-C, DNP 08/12/2020, 10:26 AM [facility] [address] [place], [place] [address] [phone]

## 2020-08-12 NOTE — Progress Notes (Signed)
I have read the note, and I agree with the clinical assessment and plan.  Heather [name] K Heather [name]   

## 2020-08-13 DIAGNOSIS — M545 Low back pain, unspecified: Secondary | ICD-10-CM | POA: Diagnosis not present

## 2020-08-13 DIAGNOSIS — M25561 Pain in right knee: Secondary | ICD-10-CM | POA: Diagnosis not present

## 2020-08-13 DIAGNOSIS — M25562 Pain in left knee: Secondary | ICD-10-CM | POA: Diagnosis not present

## 2020-08-13 DIAGNOSIS — R531 Weakness: Secondary | ICD-10-CM | POA: Diagnosis not present

## 2020-08-15 DIAGNOSIS — M25561 Pain in right knee: Secondary | ICD-10-CM | POA: Diagnosis not present

## 2020-08-15 DIAGNOSIS — M545 Low back pain, unspecified: Secondary | ICD-10-CM | POA: Diagnosis not present

## 2020-08-15 DIAGNOSIS — R531 Weakness: Secondary | ICD-10-CM | POA: Diagnosis not present

## 2020-08-15 DIAGNOSIS — M25562 Pain in left knee: Secondary | ICD-10-CM | POA: Diagnosis not present

## 2020-08-19 DIAGNOSIS — M25562 Pain in left knee: Secondary | ICD-10-CM | POA: Diagnosis not present

## 2020-08-19 DIAGNOSIS — M25561 Pain in right knee: Secondary | ICD-10-CM | POA: Diagnosis not present

## 2020-08-19 DIAGNOSIS — M545 Low back pain, unspecified: Secondary | ICD-10-CM | POA: Diagnosis not present

## 2020-08-19 DIAGNOSIS — R531 Weakness: Secondary | ICD-10-CM | POA: Diagnosis not present

## 2020-08-21 DIAGNOSIS — M25561 Pain in right knee: Secondary | ICD-10-CM | POA: Diagnosis not present

## 2020-08-21 DIAGNOSIS — R531 Weakness: Secondary | ICD-10-CM | POA: Diagnosis not present

## 2020-08-21 DIAGNOSIS — M25562 Pain in left knee: Secondary | ICD-10-CM | POA: Diagnosis not present

## 2020-08-21 DIAGNOSIS — M545 Low back pain, unspecified: Secondary | ICD-10-CM | POA: Diagnosis not present

## 2020-08-23 DIAGNOSIS — H26493 Other secondary cataract, bilateral: Secondary | ICD-10-CM | POA: Diagnosis not present

## 2020-08-23 DIAGNOSIS — Z961 Presence of intraocular lens: Secondary | ICD-10-CM | POA: Diagnosis not present

## 2020-08-23 DIAGNOSIS — E119 Type 2 diabetes mellitus without complications: Secondary | ICD-10-CM | POA: Diagnosis not present

## 2020-08-23 DIAGNOSIS — H18413 Arcus senilis, bilateral: Secondary | ICD-10-CM | POA: Diagnosis not present

## 2020-08-23 DIAGNOSIS — H26491 Other secondary cataract, right eye: Secondary | ICD-10-CM | POA: Diagnosis not present

## 2020-08-26 ENCOUNTER — Other Ambulatory Visit: Payer: Self-pay

## 2020-08-26 ENCOUNTER — Encounter (INDEPENDENT_AMBULATORY_CARE_PROVIDER_SITE_OTHER): Payer: Self-pay | Admitting: Family Medicine

## 2020-08-26 ENCOUNTER — Ambulatory Visit (INDEPENDENT_AMBULATORY_CARE_PROVIDER_SITE_OTHER): Payer: Medicare Other | Admitting: Family Medicine

## 2020-08-26 VITALS — BP 128/72 | HR 56 | Temp 98.2°F | Ht 65.0 in | Wt 206.0 lb

## 2020-08-26 DIAGNOSIS — F439 Reaction to severe stress, unspecified: Secondary | ICD-10-CM | POA: Diagnosis not present

## 2020-08-26 DIAGNOSIS — E559 Vitamin D deficiency, unspecified: Secondary | ICD-10-CM

## 2020-08-26 DIAGNOSIS — Z6841 Body Mass Index (BMI) 40.0 and over, adult: Secondary | ICD-10-CM

## 2020-08-26 MED ORDER — VITAMIN D (ERGOCALCIFEROL) 1.25 MG (50000 UNIT) PO CAPS
50000.0000 [IU] | ORAL_CAPSULE | ORAL | 0 refills | Status: DC
Start: 2020-08-26 — End: 2020-09-23

## 2020-08-27 DIAGNOSIS — M545 Low back pain, unspecified: Secondary | ICD-10-CM | POA: Diagnosis not present

## 2020-08-27 DIAGNOSIS — R531 Weakness: Secondary | ICD-10-CM | POA: Diagnosis not present

## 2020-08-27 DIAGNOSIS — M25562 Pain in left knee: Secondary | ICD-10-CM | POA: Diagnosis not present

## 2020-08-27 DIAGNOSIS — M25561 Pain in right knee: Secondary | ICD-10-CM | POA: Diagnosis not present

## 2020-08-29 ENCOUNTER — Other Ambulatory Visit: Payer: Self-pay

## 2020-08-29 ENCOUNTER — Ambulatory Visit (HOSPITAL_COMMUNITY)
Admission: RE | Admit: 2020-08-29 | Discharge: 2020-08-29 | Disposition: A | Discharge status: Home / Self Care | Payer: Medicare Other | Source: Ambulatory Visit | Attending: Vascular Surgery | Admitting: Vascular Surgery

## 2020-08-29 ENCOUNTER — Ambulatory Visit (INDEPENDENT_AMBULATORY_CARE_PROVIDER_SITE_OTHER): Payer: Medicare Other | Admitting: Vascular Surgery

## 2020-08-29 ENCOUNTER — Encounter: Payer: Self-pay | Admitting: Vascular Surgery

## 2020-08-29 VITALS — BP 111/63 | HR 75 | Temp 98.0°F | Resp 16 | Ht 64.5 in | Wt 210.0 lb

## 2020-08-29 DIAGNOSIS — Z9841 Cataract extraction status, right eye: Secondary | ICD-10-CM | POA: Diagnosis not present

## 2020-08-29 DIAGNOSIS — M7989 Other specified soft tissue disorders: Secondary | ICD-10-CM | POA: Diagnosis not present

## 2020-08-29 DIAGNOSIS — I890 Lymphedema, not elsewhere classified: Secondary | ICD-10-CM

## 2020-08-29 DIAGNOSIS — R609 Edema, unspecified: Secondary | ICD-10-CM | POA: Insufficient documentation

## 2020-08-29 DIAGNOSIS — I2510 Atherosclerotic heart disease of native coronary artery without angina pectoris: Secondary | ICD-10-CM

## 2020-08-29 NOTE — Progress Notes (Signed)
ASSESSMENT & PLAN   LYMPHEDEMA AND MILD CHRONIC VENOUS INSUFFICIENCY: Based on her exam I think she has some mild chronic lymphedema of the left leg.  She states that she has had this since she was 14.  She does have some mild superficial venous reflux on the left but I do not think this is contributing significantly to her swelling.  Regardless, the treatment is exactly the same for her mild venous insufficiency and lymphedema.  We have discussed the importance of intermittent leg elevation and the proper positioning for this.  I have encouraged her to continue to wear her knee-high compression stocking with a gradient of 15 to 28 mmHg.  I encouraged her to avoid prolonged sitting and standing.  We discussed importance of exercise specifically walking and water aerobics.  In addition we discussed the importance of maintaining a healthy weight as central obesity especially increases lower extremity venous pressure.  Currently I do not think any more aggressive work-up is indicated.  I be happy to see her back at any time if her swelling worsens or she develops new symptoms.  REASON FOR CONSULT:    Left leg swelling.  The consult is requested by Dr. Olivia Mackie McLean-Scocuzza  HPI:   ELPIDIA KARN is a 75 y.o. female who presents for evaluation of left lower extremity swelling.  On my history the patient states that she thinks she developed swelling in the left leg beginning when she was about 75 years old.  This came on gradually.  She had swelling since that time that has not significantly changed.  She is unaware of any previous history of DVT or phlebitis.  Her swelling is asymptomatic.  She denies any significant aching pain or heaviness in her legs.  She does elevate her legs which helps her swelling some.  She does have some knee-high compression stockings which she wears at times and this also seems to help with the swelling.  She is had no previous venous procedures.  She is followed at the  wellness center and is working on weight loss.  Past Medical History:  Diagnosis Date   Abdominal hernia    Abnormal Pap smear    ALLERGIC RHINITIS 10/22/2006   Allergy    ANEMIA 12/18/2008   Arthritis    scoliosis moderate deg changes lumbar Xray 11/04/17    ASYMPTOMATIC POSTMENOPAUSAL STATUS 11/22/2007   Back pain    CAD (coronary artery disease)    in LAD   CHF (congestive heart failure) (HCC)    Complication of anesthesia    Degenerative arthritis    DIABETES MELLITUS, TYPE II 01/13/2007   Diverticulosis    Dyslipidemia    Fibroid    Frequent headaches    GERD 07/20/2007   GOITER, MULTINODULAR 07/20/2007   Headache(784.0) 07/20/2007   HEARING LOSS 11/22/2007   Hiatal hernia    Hiatal hernia    large   History of chicken pox    Hx of colposcopy with cervical biopsy    HYPERCHOLESTEROLEMIA 01/13/2007   Hyperglycemia    HYPERTENSION 10/22/2006   Lung nodule    unchanged since 05/26/13    Migraines    NASH (nonalcoholic steatohepatitis)    Nocturnal hypoxemia 02/17/2013   Obesity    Obstructive sleep apnea    OSA on CPAP    per neurology   OSTEOARTHRITIS 10/22/2006   Other chronic nonalcoholic liver disease 2/53/6644   Sedimentation rate elevation    Sleep apnea    on cpap  Urine incontinence    UTI (urinary tract infection)     Family History  Problem Relation Age of Onset   Asthma Mother    Depression Mother    Bipolar disorder Mother    Dementia Mother    Arthritis Mother    Breast cancer Mother        primary   Colon cancer Mother        mets from breast   Cancer Mother        colon   Hypertension Father    Migraines Father    Cancer Maternal Aunt        ?   Heart disease Maternal Grandmother    Cancer Maternal Grandfather        stomach ?    SOCIAL HISTORY: Social History   Tobacco Use   Smoking status: Former    Packs/day: 2.00    Years: 20.00    Pack years: 40.00    Types: Cigarettes    Quit date: 03/03/1979    Years since quitting: 41.5    Smokeless tobacco: Never  Substance Use Topics   Alcohol use: Yes    Comment: rare    Allergies  Allergen Reactions   Aspirin    Coconut Oil    Lisinopril     REACTION: cough   Metoprolol Itching   Peanut-Containing Drug Products    Penicillins     REACTION: rash   Prednisone    Strawberry Extract     Current Outpatient Medications  Medication Sig Dispense Refill   acetaminophen (TYLENOL) 500 MG tablet Take 650 mg by mouth 3 (three) times daily as needed for pain.      Calcium-Vitamin D-Vitamin K (VIACTIV CALCIUM PLUS D PO) Take by mouth.     cetirizine (ZYRTEC) 10 MG tablet Take 10 mg by mouth daily.     clopidogrel (PLAVIX) 75 MG tablet Take 1 tablet (75 mg total) by mouth daily. Needs appt for future refills 90 tablet 3   Cyanocobalamin 1000 MCG CAPS Take 1 capsule by mouth daily.     diclofenac Sodium (VOLTAREN) 1 % GEL Apply topically 4 (four) times daily.     ezetimibe (ZETIA) 10 MG tablet Take 1 tablet (10 mg total) by mouth daily. 90 tablet 3   Ferrous Sulfate (IRON) 325 (65 FE) MG TABS Take 1 tablet by mouth daily.     furosemide (LASIX) 40 MG tablet TAKE 1 TABLET DAILY AS     NEEDED 90 tablet 1   gabapentin (NEURONTIN) 100 MG capsule Take 3 pills at bedtime 270 capsule 3   glucosamine-chondroitin 500-400 MG tablet Take 1 tablet by mouth 2 (two) times daily.     Multiple Vitamin (MULTIVITAMIN) tablet Take 1 tablet by mouth daily.     Omega-3 Fatty Acids (FISH OIL) 1000 MG CAPS Take 1 capsule by mouth daily.     pantoprazole (PROTONIX) 20 MG tablet TAKE 1 TABLET DAILY 30     MINUTES BEFORE FOOD        (DISCONTINUE DEXILANT,     DISCONTINUE 40MG DOSE) 90 tablet 3   polyethylene glycol (MIRALAX / GLYCOLAX) 17 g packet Take 17 g by mouth daily as needed.     potassium chloride SA (KLOR-CON M20) 20 MEQ tablet Take 1 tablet (20 mEq total) by mouth daily. 90 tablet 3   pyridOXINE (VITAMIN B-6) 100 MG tablet Take 200 mg by mouth daily.     rosuvastatin (CRESTOR) 40 MG  tablet Take 1 tablet (  40 mg total) by mouth daily. At night 90 tablet 3   SF 5000 PLUS 1.1 % CREA dental cream   0   topiramate (TOPAMAX) 25 MG tablet Take 1 tablet (25 mg total) by mouth at bedtime. 90 tablet 3   traMADol (ULTRAM) 50 MG tablet Take 1 tablet (50 mg total) by mouth every 12 (twelve) hours as needed. 10 tablet 0   Turmeric 500 MG CAPS Take 1 capsule by mouth daily.     Vitamin D, Ergocalciferol, (DRISDOL) 1.25 MG (50000 UNIT) CAPS capsule Take 1 capsule (50,000 Units total) by mouth every 7 (seven) days. 4 capsule 0   vitamin E 200 UNIT capsule Take 200 Units by mouth daily.     COVID-19 mRNA Vac-TriS, Pfizer, (PFIZER-BIONT COVID-19 VAC-TRIS) SUSP injection Inject into the muscle. (Patient not taking: Reported on 08/29/2020) 0.3 mL 0   UNABLE TO FIND Instaflex for knees (Patient not taking: Reported on 08/29/2020)     No current facility-administered medications for this visit.    REVIEW OF SYSTEMS:  [X]  denotes positive finding, [ ]  denotes negative finding Cardiac  Comments:  Chest pain or chest pressure:    Shortness of breath upon exertion:    Short of breath when lying flat:    Irregular heart rhythm:        Vascular    Pain in calf, thigh, or hip brought on by ambulation:    Pain in feet at night that wakes you up from your sleep:     Blood clot in your veins:    Leg swelling:  x       Pulmonary    Oxygen at home:    Productive cough:     Wheezing:         Neurologic    Sudden weakness in arms or legs:     Sudden numbness in arms or legs:     Sudden onset of difficulty speaking or slurred speech:    Temporary loss of vision in one eye:     Problems with dizziness:         Gastrointestinal    Blood in stool:     Vomited blood:         Genitourinary    Burning when urinating:     Blood in urine:        Psychiatric    Major depression:         Hematologic    Bleeding problems:    Problems with blood clotting too easily:        Skin    Rashes or  ulcers:        Constitutional    Fever or chills:    -  PHYSICAL EXAM:   Vitals:   08/29/20 1432  BP: 111/63  Pulse: 75  Resp: 16  Temp: 98 F (36.7 C)  TempSrc: Temporal  SpO2: 97%  Weight: 210 lb (95.3 kg)  Height: 5' 4.5" (1.638 m)   Body mass index is 35.49 kg/m.  GENERAL: The patient is a well-nourished female, in no acute distress. The vital signs are documented above. CARDIAC: There is a regular rate and rhythm.  VASCULAR: I do not detect carotid bruits. She has palpable pedal pulses bilaterally. She has mild left lower extremity swelling which is nonpitting consistent with mild lymphedema. She has no significant hyperpigmentation or varicose veins. PULMONARY: There is good air exchange bilaterally without wheezing or rales. ABDOMEN: Soft and non-tender with normal pitched bowel sounds.  MUSCULOSKELETAL:  There are no major deformities. NEUROLOGIC: No focal weakness or paresthesias are detected. SKIN: There are no ulcers or rashes noted. PSYCHIATRIC: The patient has a normal affect.  DATA:    VENOUS DUPLEX: I have independently interpreted the venous duplex scan today.  This was of the left lower extremity only.  There is no evidence of DVT.  There is no deep venous reflux.  There is superficial venous reflux in the left great saphenous vein from the distal thigh to the proximal calf.  The vein measures 4 mm in diameter throughout this area.     Deitra Mayo [facility]

## 2020-09-03 DIAGNOSIS — M545 Low back pain, unspecified: Secondary | ICD-10-CM | POA: Diagnosis not present

## 2020-09-03 DIAGNOSIS — M25561 Pain in right knee: Secondary | ICD-10-CM | POA: Diagnosis not present

## 2020-09-03 DIAGNOSIS — R531 Weakness: Secondary | ICD-10-CM | POA: Diagnosis not present

## 2020-09-03 DIAGNOSIS — M25562 Pain in left knee: Secondary | ICD-10-CM | POA: Diagnosis not present

## 2020-09-03 NOTE — Progress Notes (Signed)
Chief Complaint:   OBESITY Heather [name] is here to discuss her progress with her obesity treatment plan along with follow-up of her obesity related diagnoses. Heather [name] is on the Category 2 Plan or keeping a food journal and adhering to recommended goals of 1200 calories and 75+ grams of protein daily and states she is following her eating plan approximately 25% of the time. Heather [name] states she is doing physical therapy for 60 minutes 2 times per week.  Today's visit was #: 51 Starting weight: 268 lbs Starting date: 12/23/2016 Today's weight: 206 lbs Today's date: 08/26/2020 Total lbs lost to date: 54 Total lbs lost since last in-office visit: 0  Interim History: Heather [name] has been struggling more to stay on track with her eating plan. She has been dealing with multiple health issues, which has increased her stress levels and made following her plan difficult.  Subjective:   1. Vitamin D deficiency Heather [name] is stable on Vit D, and she requests a refill today. She is on Viactiv as well.  2. Stress Heather [name] has had increased stress with multiple health issues, which is affecting her meal planning and increasing her emotional eating behaviors.  Assessment/Plan:   1. Vitamin D deficiency Low Vitamin D level contributes to fatigue and are associated with obesity, breast, and colon cancer. We will refill prescription Vitamin D for 1 month. Jennings will follow-up for routine testing of Vitamin D, at least 2-3 times per year to avoid over-replacement.  - Vitamin D, Ergocalciferol, (DRISDOL) 1.25 MG (50000 UNIT) CAPS capsule; Take 1 capsule (50,000 Units total) by mouth every 7 (seven) days.  Dispense: 4 capsule; Refill: 0  2. Stress [name] will continue to work on decreasing emotional eating behaviors, and she feels things are getting better. Encouragement and support was offered today.  3. Obesity with current BMI 34.3 Heather [name] is currently in the action stage of change. As such, her goal is to continue with  weight loss efforts. She has agreed to keeping a food journal and adhering to recommended goals of 1200 calories and 75+ grams of protein daily.   Exercise goals: As is.  Behavioral modification strategies: increasing lean protein intake and dealing with family or coworker sabotage.  Heather [name] has agreed to follow-up with our clinic in 4 weeks. She was informed of the importance of frequent follow-up visits to maximize her success with intensive lifestyle modifications for her multiple health conditions.   Objective:   Blood pressure 128/72, pulse (!) 56, temperature 98.2 F (36.8 C), height 5' 5"  (1.651 m), weight 206 lb (93.4 kg), SpO2 96 %. Body mass index is 34.28 kg/m.  General: Cooperative, alert, well developed, in no acute distress. HEENT: Conjunctivae and lids unremarkable. Cardiovascular: Regular rhythm.  Lungs: Normal work of breathing. Neurologic: No focal deficits.   Lab Results  Component Value Date   CREATININE 0.82 06/21/2020   BUN 20 06/21/2020   NA 142 06/21/2020   K 3.5 06/21/2020   CL 107 06/21/2020   CO2 28 06/21/2020   Lab Results  Component Value Date   ALT 15 06/21/2020   AST 18 06/21/2020   ALKPHOS 51 06/21/2020   BILITOT 0.3 06/21/2020   Lab Results  Component Value Date   HGBA1C 5.9 06/21/2020   HGBA1C 5.8 (H) 04/30/2020   HGBA1C 5.6 10/09/2019   HGBA1C 5.8 (H) 07/05/2019   HGBA1C 5.6 12/05/2018   Lab Results  Component Value Date   INSULIN 4.3 10/09/2019   INSULIN 8.6 07/05/2019   INSULIN 6.4  12/05/2018   INSULIN 3.6 04/27/2018   INSULIN 5.6 12/01/2017   Lab Results  Component Value Date   TSH 2.23 06/21/2020   Lab Results  Component Value Date   CHOL 142 07/17/2020   HDL 67 07/17/2020   LDLCALC 65 07/17/2020   TRIG 50 07/17/2020   CHOLHDL 2.1 07/17/2020   Lab Results  Component Value Date   VD25OH 58.1 04/30/2020   VD25OH 42.0 10/09/2019   VD25OH 45.1 07/05/2019   Lab Results  Component Value Date   WBC 2.8 (L)  06/21/2020   WBC 2.9 (L) 06/21/2020   HGB 13.2 06/21/2020   HGB 13.4 06/21/2020   HCT 39.5 06/21/2020   HCT 42.0 06/21/2020   MCV 94.9 06/21/2020   MCV 96 06/21/2020   PLT 167.0 06/21/2020   PLT 179 06/21/2020   Lab Results  Component Value Date   IRON 91 12/07/2019   TIBC 255 12/07/2019   FERRITIN 147 12/07/2019    Obesity Behavioral Intervention:   Approximately 15 minutes were spent on the discussion below.  ASK: We discussed the diagnosis of obesity with Heather [name] today and Heather [name] agreed to give Heather [name] permission to discuss obesity behavioral modification therapy today.  ASSESS: Derica has the diagnosis of obesity and her BMI today is 34.28. Heather [name] is in the action stage of change.   ADVISE: Heather [name] was educated on the multiple health risks of obesity as well as the benefit of weight loss to improve her health. She was advised of the need for long term treatment and the importance of lifestyle modifications to improve her current health and to decrease her risk of future health problems.  AGREE: Multiple dietary modification options and treatment options were discussed and Heather [name] agreed to follow the recommendations documented in the above note.  ARRANGE: Heather [name] was educated on the importance of frequent visits to treat obesity as outlined per CMS and USPSTF guidelines and agreed to schedule her next follow up appointment today.  Attestation Statements:   Reviewed by clinician on day of visit: allergies, medications, problem list, medical history, surgical history, family history, social history, and previous encounter notes.   I, Heather [name], am acting as transcriptionist for Heather Nip, MD.  I have reviewed the above documentation for accuracy and completeness, and I agree with the above. -  Heather Nip, MD

## 2020-09-05 DIAGNOSIS — R531 Weakness: Secondary | ICD-10-CM | POA: Diagnosis not present

## 2020-09-05 DIAGNOSIS — M25561 Pain in right knee: Secondary | ICD-10-CM | POA: Diagnosis not present

## 2020-09-05 DIAGNOSIS — M25562 Pain in left knee: Secondary | ICD-10-CM | POA: Diagnosis not present

## 2020-09-05 DIAGNOSIS — M545 Low back pain, unspecified: Secondary | ICD-10-CM | POA: Diagnosis not present

## 2020-09-06 DIAGNOSIS — H26492 Other secondary cataract, left eye: Secondary | ICD-10-CM | POA: Diagnosis not present

## 2020-09-10 DIAGNOSIS — M25562 Pain in left knee: Secondary | ICD-10-CM | POA: Diagnosis not present

## 2020-09-10 DIAGNOSIS — M25561 Pain in right knee: Secondary | ICD-10-CM | POA: Diagnosis not present

## 2020-09-10 DIAGNOSIS — R531 Weakness: Secondary | ICD-10-CM | POA: Diagnosis not present

## 2020-09-10 DIAGNOSIS — M545 Low back pain, unspecified: Secondary | ICD-10-CM | POA: Diagnosis not present

## 2020-09-12 DIAGNOSIS — R3989 Other symptoms and signs involving the genitourinary system: Secondary | ICD-10-CM | POA: Diagnosis not present

## 2020-09-12 DIAGNOSIS — R531 Weakness: Secondary | ICD-10-CM | POA: Diagnosis not present

## 2020-09-12 DIAGNOSIS — M25562 Pain in left knee: Secondary | ICD-10-CM | POA: Diagnosis not present

## 2020-09-12 DIAGNOSIS — M545 Low back pain, unspecified: Secondary | ICD-10-CM | POA: Diagnosis not present

## 2020-09-12 DIAGNOSIS — M25561 Pain in right knee: Secondary | ICD-10-CM | POA: Diagnosis not present

## 2020-09-12 DIAGNOSIS — Z9842 Cataract extraction status, left eye: Secondary | ICD-10-CM | POA: Diagnosis not present

## 2020-09-16 DIAGNOSIS — R531 Weakness: Secondary | ICD-10-CM | POA: Diagnosis not present

## 2020-09-16 DIAGNOSIS — M25562 Pain in left knee: Secondary | ICD-10-CM | POA: Diagnosis not present

## 2020-09-16 DIAGNOSIS — M25561 Pain in right knee: Secondary | ICD-10-CM | POA: Diagnosis not present

## 2020-09-16 DIAGNOSIS — M545 Low back pain, unspecified: Secondary | ICD-10-CM | POA: Diagnosis not present

## 2020-09-16 NOTE — Progress Notes (Signed)
[place] Phone: [phone] Subjective:   [name], am serving as a scribe for Dr. Hulan Saas. This visit occurred during the SARS-CoV-2 public health emergency.  Safety protocols were in place, including screening questions prior to the visit, additional usage of staff PPE, and extensive cleaning of exam room while observing appropriate contact time as indicated for disinfecting solutions.   I'm seeing this patient by the request  of:  McLean-Scocuzza, [name], MD  CC: Bilateral knee pain follow-up  EXN:[id number]  07/18/2020 Known arthritic changes.  Patient did respond very well to a epidural more than 2 years ago.  At this point I would like to repeat the injection.  We will see how patient responds.  We will get new x-rays today as well to further evaluate for any progression of the arthritis.  Patient will follow up with me again 4 weeks after the injection  Update 09/17/2020 JAKI STEPTOE is a 75 y.o. female coming in with complaint of knee pain and lower back pain.  Known history of DDD, lumbar.   Patient is x-rays taken Jul 20, 2020 that showed the patient did have prominent facet arthropathy as well as degenerative disc disease at multiple levels.  Very mild progression since 2019.  Patient did undergo an L4-L5 epidural July 31, 2020. Epidural did help to alleviate her pain. Had some leg cramps after epidural. Used ice and stretching for pain. Has not had cramps since starting to drink Gatorade.   Patient has been doing physical therapy 2x a week for knee pain. Would like to continue physical therapy with [facility] if possible. Has not had to use cane or rollator.      Past Medical History:  Diagnosis Date   Abdominal hernia    Abnormal Pap smear    ALLERGIC RHINITIS 10/22/2006   Allergy    ANEMIA 12/18/2008   Arthritis    scoliosis moderate deg changes lumbar Xray 11/04/17    ASYMPTOMATIC  POSTMENOPAUSAL STATUS 11/22/2007   Back pain    CAD (coronary artery disease)    in LAD   CHF (congestive heart failure) (HCC)    Complication of anesthesia    Degenerative arthritis    DIABETES MELLITUS, TYPE II 01/13/2007   Diverticulosis    Dyslipidemia    Fibroid    Frequent headaches    GERD 07/20/2007   GOITER, MULTINODULAR 07/20/2007   Headache(784.0) 07/20/2007   HEARING LOSS 11/22/2007   Hiatal hernia    Hiatal hernia    large   History of chicken pox    Hx of colposcopy with cervical biopsy    HYPERCHOLESTEROLEMIA 01/13/2007   Hyperglycemia    HYPERTENSION 10/22/2006   Lung nodule    unchanged since 05/26/13    Migraines    NASH (nonalcoholic steatohepatitis)    Nocturnal hypoxemia 02/17/2013   Obesity    Obstructive sleep apnea    OSA on CPAP    per neurology   OSTEOARTHRITIS 10/22/2006   Other chronic nonalcoholic liver disease 9/67/5916   Sedimentation rate elevation    Sleep apnea    on cpap   Urine incontinence    UTI (urinary tract infection)    Past Surgical History:  Procedure Laterality Date   BREAST SURGERY  1984   Breast reduction b/l    CATARACT EXTRACTION, BILATERAL     DEXA  08/2005   DILATION AND CURETTAGE OF UTERUS     ELECTROCARDIOGRAM  10/15/2006   ESOPHAGOGASTRODUODENOSCOPY  12/08/2005   FOOT SURGERY     hammertoe and bunion 05/2017    Stress Cardiolite  10/21/2005   sweat gland removal     WISDOM TOOTH EXTRACTION     Social History   Socioeconomic History   Marital status: Married    Spouse name: [name]   Number of children: 2   Years of education: College   Highest education level: Not on file  Occupational History   Occupation: Retired  Tobacco Use   Smoking status: Former    Packs/day: 2.00    Years: 20.00    Pack years: 40.00    Types: Cigarettes    Quit date: 03/03/1979    Years since quitting: 41.5   Smokeless tobacco: Never  Substance and Sexual Activity   Alcohol use: Yes    Comment: rare   Drug use: No   Sexual  activity: Yes    Birth control/protection: Surgical  Other Topics Concern   Not on file  Social History Narrative   Patient is married [name]).   Right Handed   Drinks 1 cup caffeine   Social Determinants of Health   Financial Resource Strain: Low Risk    Difficulty of Paying Living Expenses: Not hard at all  Food Insecurity: No Food Insecurity   Worried About Charity fundraiser in the Last Year: Never true   Arboriculturist in the Last Year: Never true  Transportation Needs: No Transportation Needs   Lack of Transportation (Medical): No   Lack of Transportation (Non-Medical): No  Physical Activity: Not on file  Stress: No Stress Concern Present   Feeling of Stress : Not at all  Social Connections: Unknown   Frequency of Communication with Friends and Family: Not on file   Frequency of Social Gatherings with Friends and Family: Not on file   Attends Religious Services: Not on file   Active Member of Clubs or Organizations: Not on file   Attends Archivist Meetings: Not on file   Marital Status: Married   Allergies  Allergen Reactions   Aspirin    Coconut Oil    Lisinopril     REACTION: cough   Metoprolol Itching   Peanut-Containing Drug Products    Penicillins     REACTION: rash   Prednisone    Strawberry Extract    Family History  Problem Relation Age of Onset   Asthma Mother    Depression Mother    Bipolar disorder Mother    Dementia Mother    Arthritis Mother    Breast cancer Mother        primary   Colon cancer Mother        mets from breast   Cancer Mother        colon   Hypertension Father    Migraines Father    Cancer Maternal Aunt        ?   Heart disease Maternal Grandmother    Cancer Maternal Grandfather        stomach ?     Current Outpatient Medications (Cardiovascular):    ezetimibe (ZETIA) 10 MG tablet, Take 1 tablet (10 mg total) by mouth daily.   furosemide (LASIX) 40 MG tablet, TAKE 1 TABLET DAILY AS     NEEDED    rosuvastatin (CRESTOR) 40 MG tablet, Take 1 tablet (40 mg total) by mouth daily. At night  Current Outpatient Medications (Respiratory):    cetirizine (ZYRTEC) 10 MG tablet,  Take 10 mg by mouth daily.  Current Outpatient Medications (Analgesics):    acetaminophen (TYLENOL) 500 MG tablet, Take 650 mg by mouth 3 (three) times daily as needed for pain.    traMADol (ULTRAM) 50 MG tablet, Take 1 tablet (50 mg total) by mouth every 12 (twelve) hours as needed.  Current Outpatient Medications (Hematological):    clopidogrel (PLAVIX) 75 MG tablet, Take 1 tablet (75 mg total) by mouth daily. Needs appt for future refills   Cyanocobalamin 1000 MCG CAPS, Take 1 capsule by mouth daily.   Ferrous Sulfate (IRON) 325 (65 FE) MG TABS, Take 1 tablet by mouth daily.  Current Outpatient Medications (Other):    Calcium-Vitamin D-Vitamin K (VIACTIV CALCIUM PLUS D PO), Take by mouth.   COVID-19 mRNA Vac-TriS, Pfizer, (PFIZER-BIONT COVID-19 VAC-TRIS) SUSP injection, Inject into the muscle.   diclofenac Sodium (VOLTAREN) 1 % GEL, Apply topically 4 (four) times daily.   gabapentin (NEURONTIN) 100 MG capsule, Take 3 pills at bedtime   glucosamine-chondroitin 500-400 MG tablet, Take 1 tablet by mouth 2 (two) times daily.   Multiple Vitamin (MULTIVITAMIN) tablet, Take 1 tablet by mouth daily.   Omega-3 Fatty Acids (FISH OIL) 1000 MG CAPS, Take 1 capsule by mouth daily.   pantoprazole (PROTONIX) 20 MG tablet, TAKE 1 TABLET DAILY 30     MINUTES BEFORE FOOD        (DISCONTINUE DEXILANT,     DISCONTINUE 40MG DOSE)   polyethylene glycol (MIRALAX / GLYCOLAX) 17 g packet, Take 17 g by mouth daily as needed.   potassium chloride SA (KLOR-CON M20) 20 MEQ tablet, Take 1 tablet (20 mEq total) by mouth daily.   pyridOXINE (VITAMIN B-6) 100 MG tablet, Take 200 mg by mouth daily.   SF 5000 PLUS 1.1 % CREA dental cream,    topiramate (TOPAMAX) 25 MG tablet, Take 1 tablet (25 mg total) by mouth at bedtime.   Turmeric 500 MG CAPS,  Take 1 capsule by mouth daily.   UNABLE TO FIND, Instaflex for knees   Vitamin D, Ergocalciferol, (DRISDOL) 1.25 MG (50000 UNIT) CAPS capsule, Take 1 capsule (50,000 Units total) by mouth every 7 (seven) days.   vitamin E 200 UNIT capsule, Take 200 Units by mouth daily.   Reviewed prior external information including notes and imaging from  primary care provider As well as notes that were available from care everywhere and other healthcare systems.  Past medical history, social, surgical and family history all reviewed in electronic medical record.  No pertanent information unless stated regarding to the chief complaint.   Review of Systems:  No headache, visual changes, nausea, vomiting, diarrhea, constipation, dizziness, abdominal pain, skin rash, fevers, chills, night sweats, weight loss, swollen lymph nodes,  chest pain, shortness of breath, mood changes. POSITIVE muscle aches, body aches, joint swelling but all improved  Objective  Blood pressure 110/70, pulse 65, height 5' 4.5" (1.638 m), weight 213 lb (96.6 kg), SpO2 97 %.   General: No apparent distress alert and oriented x3 mood and affect normal, dressed appropriately.  HEENT: Pupils equal, extraocular movements intact  Respiratory: Patient's speak in full sentences and does not appear short of breath  Gait antalgic but improved.  DDD with loss of lordosis.  Deferred the rest of the exam. Instability of the knees noted.     Impression and Recommendations:    The above documentation has been reviewed and is accurate and complete Lyndal Pulley, DO

## 2020-09-17 ENCOUNTER — Other Ambulatory Visit: Payer: Self-pay

## 2020-09-17 ENCOUNTER — Encounter: Payer: Self-pay | Admitting: Family Medicine

## 2020-09-17 ENCOUNTER — Ambulatory Visit (INDEPENDENT_AMBULATORY_CARE_PROVIDER_SITE_OTHER): Payer: Medicare Other | Admitting: Family Medicine

## 2020-09-17 DIAGNOSIS — I2510 Atherosclerotic heart disease of native coronary artery without angina pectoris: Secondary | ICD-10-CM | POA: Diagnosis not present

## 2020-09-17 DIAGNOSIS — M5136 Other intervertebral disc degeneration, lumbar region: Secondary | ICD-10-CM

## 2020-09-17 DIAGNOSIS — M170 Bilateral primary osteoarthritis of knee: Secondary | ICD-10-CM | POA: Diagnosis not present

## 2020-09-17 NOTE — Assessment & Plan Note (Signed)
Patient is doing very well at this time.  Wants to hold from any surgical intervention.  We will hold on injections today because patient is doing better.  Follow-up again 6 to 8 weeks.

## 2020-09-17 NOTE — Assessment & Plan Note (Signed)
Patient about seems to be better after epidural.  Continue to monitor.  Continue to have patient stay active and work with physical therapy.  Patient can follow-up with me again in 6 to 12 weeks

## 2020-09-17 NOTE — Patient Instructions (Signed)
Great to see you Keep going to PT as long as they will allow you Look into aquatics Have appt in 6 weeks if doing well push it to 3 months

## 2020-09-18 DIAGNOSIS — M25562 Pain in left knee: Secondary | ICD-10-CM | POA: Diagnosis not present

## 2020-09-18 DIAGNOSIS — M545 Low back pain, unspecified: Secondary | ICD-10-CM | POA: Diagnosis not present

## 2020-09-18 DIAGNOSIS — R531 Weakness: Secondary | ICD-10-CM | POA: Diagnosis not present

## 2020-09-18 DIAGNOSIS — M25561 Pain in right knee: Secondary | ICD-10-CM | POA: Diagnosis not present

## 2020-09-23 ENCOUNTER — Other Ambulatory Visit: Payer: Self-pay

## 2020-09-23 ENCOUNTER — Encounter: Payer: Self-pay | Admitting: Family Medicine

## 2020-09-23 ENCOUNTER — Encounter (INDEPENDENT_AMBULATORY_CARE_PROVIDER_SITE_OTHER): Payer: Self-pay | Admitting: Family Medicine

## 2020-09-23 ENCOUNTER — Ambulatory Visit (INDEPENDENT_AMBULATORY_CARE_PROVIDER_SITE_OTHER): Payer: Medicare Other | Admitting: Family Medicine

## 2020-09-23 VITALS — BP 109/62 | HR 62 | Temp 98.0°F | Ht 65.0 in | Wt 206.0 lb

## 2020-09-23 DIAGNOSIS — Z6841 Body Mass Index (BMI) 40.0 and over, adult: Secondary | ICD-10-CM | POA: Diagnosis not present

## 2020-09-23 DIAGNOSIS — I10 Essential (primary) hypertension: Secondary | ICD-10-CM | POA: Diagnosis not present

## 2020-09-23 DIAGNOSIS — E559 Vitamin D deficiency, unspecified: Secondary | ICD-10-CM | POA: Diagnosis not present

## 2020-09-23 MED ORDER — VITAMIN D (ERGOCALCIFEROL) 1.25 MG (50000 UNIT) PO CAPS: 50000.0000 [IU] | ORAL_CAPSULE | ORAL | 0 refills | Status: DC

## 2020-09-24 DIAGNOSIS — R531 Weakness: Secondary | ICD-10-CM | POA: Diagnosis not present

## 2020-09-24 DIAGNOSIS — M25562 Pain in left knee: Secondary | ICD-10-CM | POA: Diagnosis not present

## 2020-09-24 DIAGNOSIS — M545 Low back pain, unspecified: Secondary | ICD-10-CM | POA: Diagnosis not present

## 2020-09-24 DIAGNOSIS — M25561 Pain in right knee: Secondary | ICD-10-CM | POA: Diagnosis not present

## 2020-09-26 ENCOUNTER — Ambulatory Visit (INDEPENDENT_AMBULATORY_CARE_PROVIDER_SITE_OTHER): Payer: Medicare Other | Admitting: Podiatry

## 2020-09-26 ENCOUNTER — Other Ambulatory Visit: Payer: Self-pay

## 2020-09-26 ENCOUNTER — Encounter: Payer: Self-pay | Admitting: Podiatry

## 2020-09-26 DIAGNOSIS — D2372 Other benign neoplasm of skin of left lower limb, including hip: Secondary | ICD-10-CM | POA: Diagnosis not present

## 2020-09-26 DIAGNOSIS — M79676 Pain in unspecified toe(s): Secondary | ICD-10-CM

## 2020-09-26 DIAGNOSIS — D689 Coagulation defect, unspecified: Secondary | ICD-10-CM | POA: Diagnosis not present

## 2020-09-26 DIAGNOSIS — B351 Tinea unguium: Secondary | ICD-10-CM | POA: Diagnosis not present

## 2020-09-26 DIAGNOSIS — D2371 Other benign neoplasm of skin of right lower limb, including hip: Secondary | ICD-10-CM

## 2020-09-26 NOTE — Progress Notes (Signed)
[name] Phone: [phone] Subjective:   Heather [name], am serving as a scribe for Dr. Hulan [name].  I'm seeing this patient by the request  of:  Heather [name], Heather Glow, MD  CC: Bilateral knee pain  BJS:[id number]  09/17/2020 Patient about seems to be better after epidural.  Continue to monitor.  Continue to have patient stay active and work with physical therapy.  Patient can follow-up with me again in 6 to 12 weeks  Patient is doing very well at this time.  Wants to hold from any surgical intervention.  We will hold on injections today because patient is doing better.  Follow-up again 6 to 8 weeks.  Update 10/01/2020 Heather [name] is a 75 y.o. female coming in with complaint of back and B knee pain. Patient states the knee pain has calmed down since her last message to [name], thinks maybe her PT visit helped with the knee pain. Patient states that she is wanting to get the knee injections today since last time the pain was so bad. Knee pain is intermittent aching pain.  Also having some mild increase in low back pain.  Wanting to know if it is secondary to the knees or not.  Does not feel like she needs an injection yet but we will be considering that in the future.      Past Medical History:  Diagnosis Date   Abdominal hernia    Abnormal Pap smear    ALLERGIC RHINITIS 10/22/2006   Allergy    ANEMIA 12/18/2008   Arthritis    scoliosis moderate deg changes lumbar Xray 11/04/17    ASYMPTOMATIC POSTMENOPAUSAL STATUS 11/22/2007   Back pain    CAD (coronary artery disease)    in LAD   CHF (congestive heart failure) (HCC)    Complication of anesthesia    Degenerative arthritis    DIABETES MELLITUS, TYPE II 01/13/2007   Diverticulosis    Dyslipidemia    Fibroid    Frequent headaches    GERD 07/20/2007   GOITER, MULTINODULAR 07/20/2007   Headache(784.0) 07/20/2007   HEARING LOSS 11/22/2007   Hiatal  hernia    Hiatal hernia    large   History of chicken pox    Hx of colposcopy with cervical biopsy    HYPERCHOLESTEROLEMIA 01/13/2007   Hyperglycemia    HYPERTENSION 10/22/2006   Lung nodule    unchanged since 05/26/13    Migraines    NASH (nonalcoholic steatohepatitis)    Nocturnal hypoxemia 02/17/2013   Obesity    Obstructive sleep apnea    OSA on CPAP    per neurology   OSTEOARTHRITIS 10/22/2006   Other chronic nonalcoholic liver disease 0/73/7106   Sedimentation rate elevation    Sleep apnea    on cpap   Urine incontinence    UTI (urinary tract infection)    Past Surgical History:  Procedure Laterality Date   BREAST SURGERY  1984   Breast reduction b/l    CATARACT EXTRACTION, BILATERAL     DEXA  08/2005   DILATION AND CURETTAGE OF UTERUS     ELECTROCARDIOGRAM  10/15/2006   ESOPHAGOGASTRODUODENOSCOPY  12/08/2005   FOOT SURGERY     hammertoe and bunion 05/2017    Stress Cardiolite  10/21/2005   sweat gland removal     WISDOM TOOTH EXTRACTION     Social History   Socioeconomic History   Marital status: Married    Spouse  name: Heather [name]   Number of children: 2   Years of education: College   Highest education level: Not on file  Occupational History   Occupation: Retired  Tobacco Use   Smoking status: Former    Packs/day: 2.00    Years: 20.00    Pack years: 40.00    Types: Cigarettes    Quit date: 03/03/1979    Years since quitting: 41.6   Smokeless tobacco: Never  Substance and Sexual Activity   Alcohol use: Yes    Comment: rare   Drug use: No   Sexual activity: Yes    Birth control/protection: Surgical  Other Topics Concern   Not on file  Social History Narrative   Patient is married Heather [name]).   Right Handed   Drinks 1 cup caffeine   Social Determinants of Health   Financial Resource Strain: Low Risk    Difficulty of Paying Living Expenses: Not hard at all  Food Insecurity: No Food Insecurity   Worried About Charity fundraiser in the Last Year:  Never true   Arboriculturist in the Last Year: Never true  Transportation Needs: No Transportation Needs   Lack of Transportation (Medical): No   Lack of Transportation (Non-Medical): No  Physical Activity: Not on file  Stress: No Stress Concern Present   Feeling of Stress : Not at all  Social Connections: Unknown   Frequency of Communication with Friends and Family: Not on file   Frequency of Social Gatherings with Friends and Family: Not on file   Attends Religious Services: Not on file   Active Member of Clubs or Organizations: Not on file   Attends Archivist Meetings: Not on file   Marital Status: Married   Allergies  Allergen Reactions   Aspirin    Coconut Oil    Lisinopril     REACTION: cough   Metoprolol Itching   Peanut-Containing Drug Products    Penicillins     REACTION: rash   Prednisone    Strawberry Extract    Family History  Problem Relation Age of Onset   Asthma Mother    Depression Mother    Bipolar disorder Mother    Dementia Mother    Arthritis Mother    Breast cancer Mother        primary   Colon cancer Mother        mets from breast   Cancer Mother        colon   Hypertension Father    Migraines Father    Cancer Maternal Aunt        ?   Heart disease Maternal Grandmother    Cancer Maternal Grandfather        stomach ?     Current Outpatient Medications (Cardiovascular):    ezetimibe (ZETIA) 10 MG tablet, Take 1 tablet (10 mg total) by mouth daily.   furosemide (LASIX) 40 MG tablet, TAKE 1 TABLET DAILY AS     NEEDED   rosuvastatin (CRESTOR) 40 MG tablet, Take 1 tablet (40 mg total) by mouth daily. At night  Current Outpatient Medications (Respiratory):    cetirizine (ZYRTEC) 10 MG tablet, Take 10 mg by mouth daily.  Current Outpatient Medications (Analgesics):    acetaminophen (TYLENOL) 500 MG tablet, Take 650 mg by mouth 3 (three) times daily as needed for pain.    traMADol (ULTRAM) 50 MG tablet, Take 1 tablet (50 mg total)  by mouth every 12 (twelve) hours as needed.  Current  Outpatient Medications (Hematological):    clopidogrel (PLAVIX) 75 MG tablet, Take 1 tablet (75 mg total) by mouth daily. Needs appt for future refills   Cyanocobalamin 1000 MCG CAPS, Take 1 capsule by mouth daily.   Ferrous Sulfate (IRON) 325 (65 FE) MG TABS, Take 1 tablet by mouth daily.  Current Outpatient Medications (Other):    Calcium-Vitamin D-Vitamin K (VIACTIV CALCIUM PLUS D PO), Take by mouth.   COVID-19 mRNA Vac-TriS, Pfizer, (PFIZER-BIONT COVID-19 VAC-TRIS) SUSP injection, Inject into the muscle.   diclofenac Sodium (VOLTAREN) 1 % GEL, Apply topically 4 (four) times daily.   gabapentin (NEURONTIN) 100 MG capsule, Take 3 pills at bedtime   glucosamine-chondroitin 500-400 MG tablet, Take 1 tablet by mouth 2 (two) times daily.   Multiple Vitamin (MULTIVITAMIN) tablet, Take 1 tablet by mouth daily.   Omega-3 Fatty Acids (FISH OIL) 1000 MG CAPS, Take 1 capsule by mouth daily.   pantoprazole (PROTONIX) 20 MG tablet, TAKE 1 TABLET DAILY 30     MINUTES BEFORE FOOD        (DISCONTINUE DEXILANT,     DISCONTINUE 40MG DOSE)   polyethylene glycol (MIRALAX / GLYCOLAX) 17 g packet, Take 17 g by mouth daily as needed.   potassium chloride SA (KLOR-CON M20) 20 MEQ tablet, Take 1 tablet (20 mEq total) by mouth daily.   pyridOXINE (VITAMIN B-6) 100 MG tablet, Take 200 mg by mouth daily.   SF 5000 PLUS 1.1 % CREA dental cream,    topiramate (TOPAMAX) 25 MG tablet, Take 1 tablet (25 mg total) by mouth at bedtime.   Turmeric 500 MG CAPS, Take 1 capsule by mouth daily.   UNABLE TO FIND, Instaflex for knees   Vitamin D, Ergocalciferol, (DRISDOL) 1.25 MG (50000 UNIT) CAPS capsule, Take 1 capsule (50,000 Units total) by mouth every 7 (seven) days.   vitamin E 200 UNIT capsule, Take 200 Units by mouth daily.   Reviewed prior external information including notes and imaging from  primary care provider As well as notes that were available from care  everywhere and other healthcare systems.  Past medical history, social, surgical and family history all reviewed in electronic medical record.  No pertanent information unless stated regarding to the chief complaint.   Review of Systems:  No headache, visual changes, nausea, vomiting, diarrhea, constipation, dizziness, abdominal pain, skin rash, fevers, chills, night sweats, weight loss, swollen lymph nodes, body aches,  chest pain, shortness of breath, mood changes. POSITIVE muscle aches, joint swelling  Objective  Blood pressure 120/70, pulse 61, height 5' 5"  (1.651 m), weight 206 lb (93.4 kg), SpO2 99 %.   General: No apparent distress alert and oriented x3 mood and affect normal, dressed appropriately.  HEENT: Pupils equal, extraocular movements intact  Respiratory: Patient's speak in full sentences and does not appear short of breath  Cardiovascular: No lower extremity edema, non tender, no erythema  Gait severely antalgic Patient does have a swelling noted of the knees bilaterally.  Instability noted with valgus and varus force.  Tender to palpation right greater than left.  Seems to be more in the medial joint space in the patellofemoral.  After informed written and verbal consent, patient was seated on exam table. Right knee was prepped with alcohol swab and utilizing anterolateral approach, patient's right knee space was injected with 4:1  marcaine 0.5%: Kenalog 71m/dL. Patient tolerated the procedure well without immediate complications.  After informed written and verbal consent, patient was seated on exam table. Left knee was prepped with  alcohol swab and utilizing anterolateral approach, patient's left knee space was injected with 4:1  marcaine 0.5%: Kenalog 85m/dL. Patient tolerated the procedure well without immediate complications.     Impression and Recommendations:     The above documentation has been reviewed and is accurate and complete ZLyndal Pulley DO

## 2020-09-26 NOTE — Progress Notes (Signed)
She presents today chief complaint of painful elongated toenails and calluses plantar aspect of the left foot.  Objective: Vital signs stable she alert and x3 pulses are palpable bilateral hallux valgus left mallet toe deformity second right.  She has reactive hyper keratomas to the medial aspect of the hallux and phalangeal joint and first metatarsophalangeal joint left foot.  Otherwise toenails are long thick yellow dystrophic-like mycotic.  Assessment: Pain in limb secondary to onychomycosis and benign skin lesions plantar aspect of the forefoot.  Plan: Debridement of benign skin lesions.  And debridement of toenails 1 through 5 bilateral.

## 2020-09-27 NOTE — Progress Notes (Signed)
Chief Complaint:   OBESITY Heather [name] is here to discuss her progress with her obesity treatment plan along with follow-up of her obesity related diagnoses. Heather [name] is on keeping a food journal and adhering to recommended goals of 1200 calories and 75+ grams of protein daily and states she is following her eating plan approximately 80% of the time. Heather [name] states she is doing physical therapy for 2 hour 2 times per week.   Today's visit was #: 11 Starting weight: 268 lbs Starting date: 12/23/2016 Today's weight: 206 lbs Today's date: 09/23/2020 Total lbs lost to date: 102 Total lbs lost since last in-office visit: 0  Interim History: Heather [name] continues to do well with maintaining her weight loss. She has been doing physical therapy and working on meal planning and trying more new recipes. She is interested in looking at the pescatarian plan.  Subjective:   1. Vitamin D deficiency Heather [name]'s last vit D level was at goal. She denies nausea, vomiting, or muscle weakness. She requests a refill.  2. Essential hypertension Heather [name]'s blood pressure is stable on her medications. She denies feeling lightheaded. She is working on increasing her water intake.  Assessment/Plan:   1. Vitamin D deficiency Low Vitamin D level contributes to fatigue and are associated with obesity, breast, and colon cancer. We will refill prescription Vitamin D for 1 month. Heather [name] will follow-up for routine testing of Vitamin D, at least 2-3 times per year to avoid over-replacement.  - Vitamin D, Ergocalciferol, (DRISDOL) 1.25 MG (50000 UNIT) CAPS capsule; Take 1 capsule (50,000 Units total) by mouth every 7 (seven) days.  Dispense: 4 capsule; Refill: 0  2. Essential hypertension Heather [name] will continue diet and exercise to improve blood pressure control. We will watch for signs of hypotension as she continues her lifestyle modifications.  3. Obesity with current BMI 34.4 Heather [name] is currently in the action stage of change. As such,  her goal is to continue with weight loss efforts. She has agreed to the Category 2 Plan and the [place].   Exercise goals: As is.  Behavioral modification strategies: increasing lean protein intake.  Heather [name] has agreed to follow-up with our clinic in 4 weeks. She was informed of the importance of frequent follow-up visits to maximize her success with intensive lifestyle modifications for her multiple health conditions.   Objective:   Blood pressure 109/62, pulse 62, temperature 98 F (36.7 C), height 5' 5"  (1.651 m), weight 206 lb (93.4 kg), SpO2 96 %. Body mass index is 34.28 kg/m.  General: Cooperative, alert, well developed, in no acute distress. HEENT: Conjunctivae and lids unremarkable. Cardiovascular: Regular rhythm.  Lungs: Normal work of breathing. Neurologic: No focal deficits.   Lab Results  Component Value Date   CREATININE 0.82 06/21/2020   BUN 20 06/21/2020   NA 142 06/21/2020   K 3.5 06/21/2020   CL 107 06/21/2020   CO2 28 06/21/2020   Lab Results  Component Value Date   ALT 15 06/21/2020   AST 18 06/21/2020   ALKPHOS 51 06/21/2020   BILITOT 0.3 06/21/2020   Lab Results  Component Value Date   HGBA1C 5.9 06/21/2020   HGBA1C 5.8 (H) 04/30/2020   HGBA1C 5.6 10/09/2019   HGBA1C 5.8 (H) 07/05/2019   HGBA1C 5.6 12/05/2018   Lab Results  Component Value Date   INSULIN 4.3 10/09/2019   INSULIN 8.6 07/05/2019   INSULIN 6.4 12/05/2018   INSULIN 3.6 04/27/2018   INSULIN 5.6 12/01/2017   Lab Results  Component  Value Date   TSH 2.23 06/21/2020   Lab Results  Component Value Date   CHOL 142 07/17/2020   HDL 67 07/17/2020   LDLCALC 65 07/17/2020   TRIG 50 07/17/2020   CHOLHDL 2.1 07/17/2020   Lab Results  Component Value Date   VD25OH 58.1 04/30/2020   VD25OH 42.0 10/09/2019   VD25OH 45.1 07/05/2019   Lab Results  Component Value Date   WBC 2.8 (L) 06/21/2020   WBC 2.9 (L) 06/21/2020   HGB 13.2 06/21/2020   HGB 13.4 06/21/2020   HCT  39.5 06/21/2020   HCT 42.0 06/21/2020   MCV 94.9 06/21/2020   MCV 96 06/21/2020   PLT 167.0 06/21/2020   PLT 179 06/21/2020   Lab Results  Component Value Date   IRON 91 12/07/2019   TIBC 255 12/07/2019   FERRITIN 147 12/07/2019    Obesity Behavioral Intervention:   Approximately 15 minutes were spent on the discussion below.  ASK: We discussed the diagnosis of obesity with Heather [name] today and Heather [name] agreed to give [name] permission to discuss obesity behavioral modification therapy today.  ASSESS: Heather [name] has the diagnosis of obesity and her BMI today is 34.28. Heather [name] is in the action stage of change.   ADVISE: Heather [name] was educated on the multiple health risks of obesity as well as the benefit of weight loss to improve her health. She was advised of the need for long term treatment and the importance of lifestyle modifications to improve her current health and to decrease her risk of future health problems.  AGREE: Multiple dietary modification options and treatment options were discussed and Heather [name] agreed to follow the recommendations documented in the above note.  ARRANGE: Heather [name] was educated on the importance of frequent visits to treat obesity as outlined per CMS and USPSTF guidelines and agreed to schedule her next follow up appointment today.  Attestation Statements:   Reviewed by clinician on day of visit: allergies, medications, problem list, medical history, surgical history, family history, social history, and previous encounter notes.   I, [name], am acting as transcriptionist for [name], MD.  I have reviewed the above documentation for accuracy and completeness, and I agree with the above. -  [name], MD

## 2020-10-01 ENCOUNTER — Encounter: Payer: Self-pay | Admitting: Family Medicine

## 2020-10-01 ENCOUNTER — Other Ambulatory Visit: Payer: Self-pay

## 2020-10-01 ENCOUNTER — Ambulatory Visit (INDEPENDENT_AMBULATORY_CARE_PROVIDER_SITE_OTHER): Payer: Medicare Other | Admitting: Family Medicine

## 2020-10-01 DIAGNOSIS — I2510 Atherosclerotic heart disease of native coronary artery without angina pectoris: Secondary | ICD-10-CM | POA: Diagnosis not present

## 2020-10-01 DIAGNOSIS — M25562 Pain in left knee: Secondary | ICD-10-CM | POA: Diagnosis not present

## 2020-10-01 DIAGNOSIS — M170 Bilateral primary osteoarthritis of knee: Secondary | ICD-10-CM

## 2020-10-01 DIAGNOSIS — M25561 Pain in right knee: Secondary | ICD-10-CM | POA: Diagnosis not present

## 2020-10-01 DIAGNOSIS — M545 Low back pain, unspecified: Secondary | ICD-10-CM | POA: Diagnosis not present

## 2020-10-01 DIAGNOSIS — R531 Weakness: Secondary | ICD-10-CM | POA: Diagnosis not present

## 2020-10-01 NOTE — Assessment & Plan Note (Signed)
End-stage arthritis.  He did respond well to the steroid injections previously.  Patient hopefully will have another 8 to 10 weeks of improvement.  Discussed the patient could be a possible viscosupplementation again in November.  Increase activity slowly.  Encourage patient to continue to monitor signs and symptoms.  Patient wants to avoid any surgical intervention but patient's BMI is under 35 so would not be a surgical candidate at this moment if necessary.  Follow-up with me again in 10 to 12 weeks.

## 2020-10-01 NOTE — Patient Instructions (Addendum)
Good to see you  Injections given today Will work on getting Gel injections approved See me again in 10-12 weeks

## 2020-10-08 DIAGNOSIS — M25561 Pain in right knee: Secondary | ICD-10-CM | POA: Diagnosis not present

## 2020-10-08 DIAGNOSIS — M25562 Pain in left knee: Secondary | ICD-10-CM | POA: Diagnosis not present

## 2020-10-08 DIAGNOSIS — R531 Weakness: Secondary | ICD-10-CM | POA: Diagnosis not present

## 2020-10-08 DIAGNOSIS — M545 Low back pain, unspecified: Secondary | ICD-10-CM | POA: Diagnosis not present

## 2020-10-15 DIAGNOSIS — M25562 Pain in left knee: Secondary | ICD-10-CM | POA: Diagnosis not present

## 2020-10-15 DIAGNOSIS — M545 Low back pain, unspecified: Secondary | ICD-10-CM | POA: Diagnosis not present

## 2020-10-15 DIAGNOSIS — R531 Weakness: Secondary | ICD-10-CM | POA: Diagnosis not present

## 2020-10-15 DIAGNOSIS — M25561 Pain in right knee: Secondary | ICD-10-CM | POA: Diagnosis not present

## 2020-10-23 ENCOUNTER — Ambulatory Visit (INDEPENDENT_AMBULATORY_CARE_PROVIDER_SITE_OTHER): Payer: Medicare Other | Admitting: Family Medicine

## 2020-10-24 DIAGNOSIS — M25562 Pain in left knee: Secondary | ICD-10-CM | POA: Diagnosis not present

## 2020-10-24 DIAGNOSIS — R531 Weakness: Secondary | ICD-10-CM | POA: Diagnosis not present

## 2020-10-24 DIAGNOSIS — M545 Low back pain, unspecified: Secondary | ICD-10-CM | POA: Diagnosis not present

## 2020-10-24 DIAGNOSIS — M25561 Pain in right knee: Secondary | ICD-10-CM | POA: Diagnosis not present

## 2020-10-25 ENCOUNTER — Telehealth: Payer: Self-pay

## 2020-10-25 NOTE — Telephone Encounter (Signed)
Confirmed fax for [facility] of care form to [facility] physical therapy. Sent to scan

## 2020-10-28 ENCOUNTER — Other Ambulatory Visit: Payer: Self-pay

## 2020-10-28 ENCOUNTER — Encounter (INDEPENDENT_AMBULATORY_CARE_PROVIDER_SITE_OTHER): Payer: Self-pay | Admitting: Family Medicine

## 2020-10-28 ENCOUNTER — Ambulatory Visit (INDEPENDENT_AMBULATORY_CARE_PROVIDER_SITE_OTHER): Payer: Medicare Other | Admitting: Family Medicine

## 2020-10-28 VITALS — BP 123/69 | HR 58 | Temp 98.6°F | Ht 65.0 in | Wt 214.0 lb

## 2020-10-28 DIAGNOSIS — E559 Vitamin D deficiency, unspecified: Secondary | ICD-10-CM | POA: Diagnosis not present

## 2020-10-28 DIAGNOSIS — Z6841 Body Mass Index (BMI) 40.0 and over, adult: Secondary | ICD-10-CM

## 2020-10-28 DIAGNOSIS — E119 Type 2 diabetes mellitus without complications: Secondary | ICD-10-CM | POA: Diagnosis not present

## 2020-10-28 MED ORDER — VITAMIN D (ERGOCALCIFEROL) 1.25 MG (50000 UNIT) PO CAPS
50000.0000 [IU] | ORAL_CAPSULE | ORAL | 0 refills | Status: DC
Start: 2020-10-28 — End: 2020-11-25

## 2020-10-28 NOTE — Progress Notes (Signed)
Chief Complaint:   OBESITY Heather [name] is here to discuss her progress with her obesity treatment plan along with follow-up of her obesity related diagnoses. Heather [name] is on the Category 2 Plan or the [place] and states she is following her eating plan approximately 10% of the time. Heather [name] states she is doing physical therapy for 60 minutes 2 times per week.  Today's visit was #: 26 Starting weight: 268 lbs Starting date: 12/23/2016 Today's weight: 214 lbs Today's date: 10/28/2020 Total lbs lost to date: 91 Total lbs lost since last in-office visit: 0  Interim History: Heather [name] fell off track with her eating plan since our last visit. She is ready to get back on track. She is generlly not hungry in the morning and still skipping meals and protein.  Subjective:   1. Type 2 diabetes mellitus without complication, without long-term current use of insulin ([place]) Heather [name] has been working on decreasing simple carbohydrates. She will be due for labs soon.  2. Vitamin D deficiency Heather [name]'s Vit D level is now at goal.  Assessment/Plan:   1. Type 2 diabetes mellitus without complication, without long-term current use of insulin ([place]) Heather [name] will continue diet and exercise, and we will recheck labs in 1 month. Good blood sugar control is important to decrease the likelihood of diabetic complications such as nephropathy, neuropathy, limb loss, blindness, coronary artery disease, and death. Intensive lifestyle modification including diet, exercise and weight loss are the first line of treatment for diabetes.   2. Vitamin D deficiency Low Vitamin D level contributes to fatigue and are associated with obesity, breast, and colon cancer. We will refill prescription Vitamin D 50,000 IU every week #4 for 1 month. We will recheck labs in 1 month. Heather [name] will follow-up for routine testing of Vitamin D, at least 2-3 times per year to avoid over-replacement.  3. Obesity whit current BMI 35.7 Heather [name] is currently  in the action stage of change. As such, her goal is to continue with weight loss efforts. She has agreed to the Category 2 Plan with breakfast smoothie recipes given.   Exercise goals: As is.  Behavioral modification strategies: increasing lean protein intake and no skipping meals.  Heather [name] has agreed to follow-up with our clinic in 3 to 4 weeks. She was informed of the importance of frequent follow-up visits to maximize her success with intensive lifestyle modifications for her multiple health conditions.   Objective:   Blood pressure 123/69, pulse (!) 58, temperature 98.6 F (37 C), height 5' 5"  (1.651 m), weight 214 lb (97.1 kg), SpO2 96 %. Body mass index is 35.61 kg/m.  General: Cooperative, alert, well developed, in no acute distress. HEENT: Conjunctivae and lids unremarkable. Cardiovascular: Regular rhythm.  Lungs: Normal work of breathing. Neurologic: No focal deficits.   Lab Results  Component Value Date   CREATININE 0.82 06/21/2020   BUN 20 06/21/2020   NA 142 06/21/2020   K 3.5 06/21/2020   CL 107 06/21/2020   CO2 28 06/21/2020   Lab Results  Component Value Date   ALT 15 06/21/2020   AST 18 06/21/2020   ALKPHOS 51 06/21/2020   BILITOT 0.3 06/21/2020   Lab Results  Component Value Date   HGBA1C 5.9 06/21/2020   HGBA1C 5.8 (H) 04/30/2020   HGBA1C 5.6 10/09/2019   HGBA1C 5.8 (H) 07/05/2019   HGBA1C 5.6 12/05/2018   Lab Results  Component Value Date   INSULIN 4.3 10/09/2019   INSULIN 8.6 07/05/2019   INSULIN 6.4  12/05/2018   INSULIN 3.6 04/27/2018   INSULIN 5.6 12/01/2017   Lab Results  Component Value Date   TSH 2.23 06/21/2020   Lab Results  Component Value Date   CHOL 142 07/17/2020   HDL 67 07/17/2020   LDLCALC 65 07/17/2020   TRIG 50 07/17/2020   CHOLHDL 2.1 07/17/2020   Lab Results  Component Value Date   VD25OH 58.1 04/30/2020   VD25OH 42.0 10/09/2019   VD25OH 45.1 07/05/2019   Lab Results  Component Value Date   WBC 2.8 (L)  06/21/2020   WBC 2.9 (L) 06/21/2020   HGB 13.2 06/21/2020   HGB 13.4 06/21/2020   HCT 39.5 06/21/2020   HCT 42.0 06/21/2020   MCV 94.9 06/21/2020   MCV 96 06/21/2020   PLT 167.0 06/21/2020   PLT 179 06/21/2020   Lab Results  Component Value Date   IRON 91 12/07/2019   TIBC 255 12/07/2019   FERRITIN 147 12/07/2019    Obesity Behavioral Intervention:   Approximately 15 minutes were spent on the discussion below.  ASK: We discussed the diagnosis of obesity with Heather [name] today and Heather [name] agreed to give [name] permission to discuss obesity behavioral modification therapy today.  ASSESS: Heather [name] has the diagnosis of obesity and her BMI today is 35.7. Heather [name] is in the action stage of change.   ADVISE: Heather [name] was educated on the multiple health risks of obesity as well as the benefit of weight loss to improve her health. She was advised of the need for long term treatment and the importance of lifestyle modifications to improve her current health and to decrease her risk of future health problems.  AGREE: Multiple dietary modification options and treatment options were discussed and Heather [name] agreed to follow the recommendations documented in the above note.  ARRANGE: Heather [name] was educated on the importance of frequent visits to treat obesity as outlined per CMS and USPSTF guidelines and agreed to schedule her next follow up appointment today.  Attestation Statements:   Reviewed by clinician on day of visit: allergies, medications, problem list, medical history, surgical history, family history, social history, and previous encounter notes.   I, [name], am acting as transcriptionist for [name], MD.  I have reviewed the above documentation for accuracy and completeness, and I agree with the above. -  [name], MD

## 2020-10-29 ENCOUNTER — Ambulatory Visit: Payer: Medicare Other | Admitting: Family Medicine

## 2020-10-31 DIAGNOSIS — M545 Low back pain, unspecified: Secondary | ICD-10-CM | POA: Diagnosis not present

## 2020-10-31 DIAGNOSIS — M25562 Pain in left knee: Secondary | ICD-10-CM | POA: Diagnosis not present

## 2020-10-31 DIAGNOSIS — R531 Weakness: Secondary | ICD-10-CM | POA: Diagnosis not present

## 2020-10-31 DIAGNOSIS — M25561 Pain in right knee: Secondary | ICD-10-CM | POA: Diagnosis not present

## 2020-11-12 ENCOUNTER — Telehealth: Payer: Self-pay

## 2020-11-12 NOTE — Telephone Encounter (Signed)
Confirmed faxed singed plan of care form to [facility] [place]. Signed form has been sent to scan.

## 2020-11-15 NOTE — Telephone Encounter (Signed)
Received second plan of care for Physical therapy from [facility]. Confirmed faxed plan of care form. Form has been sent to scan.

## 2020-11-25 ENCOUNTER — Encounter (INDEPENDENT_AMBULATORY_CARE_PROVIDER_SITE_OTHER): Payer: Self-pay | Admitting: Family Medicine

## 2020-11-25 ENCOUNTER — Ambulatory Visit (INDEPENDENT_AMBULATORY_CARE_PROVIDER_SITE_OTHER): Payer: Medicare Other | Admitting: Family Medicine

## 2020-11-25 ENCOUNTER — Other Ambulatory Visit: Payer: Self-pay

## 2020-11-25 VITALS — BP 131/78 | HR 68 | Temp 98.2°F | Ht 65.0 in | Wt 202.0 lb

## 2020-11-25 DIAGNOSIS — E119 Type 2 diabetes mellitus without complications: Secondary | ICD-10-CM | POA: Diagnosis not present

## 2020-11-25 DIAGNOSIS — E1169 Type 2 diabetes mellitus with other specified complication: Secondary | ICD-10-CM | POA: Diagnosis not present

## 2020-11-25 DIAGNOSIS — E559 Vitamin D deficiency, unspecified: Secondary | ICD-10-CM

## 2020-11-25 DIAGNOSIS — K529 Noninfective gastroenteritis and colitis, unspecified: Secondary | ICD-10-CM | POA: Diagnosis not present

## 2020-11-25 DIAGNOSIS — Z6841 Body Mass Index (BMI) 40.0 and over, adult: Secondary | ICD-10-CM

## 2020-11-25 DIAGNOSIS — E785 Hyperlipidemia, unspecified: Secondary | ICD-10-CM | POA: Diagnosis not present

## 2020-11-25 MED ORDER — VITAMIN D (ERGOCALCIFEROL) 1.25 MG (50000 UNIT) PO CAPS
50000.0000 [IU] | ORAL_CAPSULE | ORAL | 0 refills | Status: DC
Start: 2020-11-25 — End: 2020-12-17

## 2020-11-25 NOTE — Progress Notes (Signed)
Chief Complaint:   OBESITY Heather [name] is here to discuss her progress with her obesity treatment plan along with follow-up of her obesity related diagnoses. Heather [name] is on the Category 2 Plan with breakfast smoothie recipes and states she is following her eating plan approximately 0% of the time. Heather [name] states she is doing 0 minutes 0 times per week.  Today's visit was #: 80 Starting weight: 268 lbs Starting date: 12/23/2016 Today's weight: 202 lbs Today's date: 11/25/2020 Total lbs lost to date: 3 Total lbs lost since last in-office visit: 12  Interim History: Heather [name] had food poisoning for 6 days. She was unable to eat much for a while, but she is slowly starting to get back to regular food.  Subjective:   1. Type 2 diabetes mellitus without complication, without long-term current use of insulin (HCC) Heather [name] is doing well with diet before developing gastroenteritis. She is due to have labs.  2. Vitamin D deficiency Heather [name] is due for labs, and she is stable on Vit D.  3. Hyperlipidemia associated with type 2 diabetes mellitus ([place]) Heather [name] is due for labs, and she denies chest pain.  4. Gastroenteritis Heather [name] felt she had food poisoning for 6 days, and she had vomiting and diarrhea for much of that time. She is trying to increase her water intake and slowly advancing her diet as tolerated.  Assessment/Plan:   1. Type 2 diabetes mellitus without complication, without long-term current use of insulin (HCC) We will recheck labs today. We will follow up at [facility] next visit. Good blood sugar control is important to decrease the likelihood of diabetic complications such as nephropathy, neuropathy, limb loss, blindness, coronary artery disease, and death. Intensive lifestyle modification including diet, exercise and weight loss are the first line of treatment for diabetes.   - CMP14+EGFR - Insulin, random - Hemoglobin A1c  2. Vitamin D deficiency Low Vitamin D level contributes to fatigue  and are associated with obesity, breast, and colon cancer. We will check labs today. Heather [name] will follow-up for routine testing of Vitamin D, at least 2-3 times per year to avoid over-replacement.  - Vitamin D, Ergocalciferol, (DRISDOL) 1.25 MG (50000 UNIT) CAPS capsule; Take 1 capsule (50,000 Units total) by mouth every 7 (seven) days.  Dispense: 4 capsule; Refill: 0 - VITAMIN D 25 Hydroxy (Vit-D Deficiency, Fractures)  3. Hyperlipidemia associated with type 2 diabetes mellitus ([place]) Cardiovascular risk and specific lipid/LDL goals reviewed.  We discussed several lifestyle modifications today. We will check labs today. Heather [name] will continue to work on diet, exercise and weight loss efforts. Orders and follow up as documented in patient record.   - Lipid Panel With LDL/HDL Ratio  4. Gastroenteritis We will check labs today, and Heather [name] is to continue to increase her water intake and food as tolerated.   - CBC with Differential/Platelet - Amylase - Lipase  5. Obesity with current BMI 33.6 Heather [name] is currently in the action stage of change. As such, her goal is to continue with weight loss efforts. She has agreed to the Category 2 Plan.   Heather [name] to avoid spicy acidic or fatty foods, especially while recovering.  Behavioral modification strategies: planning for success.  Heather [name] has agreed to follow-up with our clinic in 3 to 4 weeks. She was informed of the importance of frequent follow-up visits to maximize her success with intensive lifestyle modifications for her multiple health conditions.   Heather [name] was informed we would discuss her lab results at her next visit unless  there is a critical issue that needs to be addressed sooner. Heather [name] agreed to keep her next visit at the agreed upon time to discuss these results.  Objective:   Blood pressure 131/78, pulse 68, temperature 98.2 F (36.8 C), height 5' 5"  (1.651 m), weight 202 lb (91.6 kg), SpO2 98 %. Body mass index is 33.61 kg/m.  General:  Cooperative, alert, well developed, in no acute distress. HEENT: Conjunctivae and lids unremarkable. Cardiovascular: Regular rhythm.  Lungs: Normal work of breathing. Neurologic: No focal deficits.   Lab Results  Component Value Date   CREATININE 0.82 06/21/2020   BUN 20 06/21/2020   NA 142 06/21/2020   K 3.5 06/21/2020   CL 107 06/21/2020   CO2 28 06/21/2020   Lab Results  Component Value Date   ALT 15 06/21/2020   AST 18 06/21/2020   ALKPHOS 51 06/21/2020   BILITOT 0.3 06/21/2020   Lab Results  Component Value Date   HGBA1C 5.9 06/21/2020   HGBA1C 5.8 (H) 04/30/2020   HGBA1C 5.6 10/09/2019   HGBA1C 5.8 (H) 07/05/2019   HGBA1C 5.6 12/05/2018   Lab Results  Component Value Date   INSULIN 4.3 10/09/2019   INSULIN 8.6 07/05/2019   INSULIN 6.4 12/05/2018   INSULIN 3.6 04/27/2018   INSULIN 5.6 12/01/2017   Lab Results  Component Value Date   TSH 2.23 06/21/2020   Lab Results  Component Value Date   CHOL 142 07/17/2020   HDL 67 07/17/2020   LDLCALC 65 07/17/2020   TRIG 50 07/17/2020   CHOLHDL 2.1 07/17/2020   Lab Results  Component Value Date   VD25OH 58.1 04/30/2020   VD25OH 42.0 10/09/2019   VD25OH 45.1 07/05/2019   Lab Results  Component Value Date   WBC 2.8 (L) 06/21/2020   WBC 2.9 (L) 06/21/2020   HGB 13.2 06/21/2020   HGB 13.4 06/21/2020   HCT 39.5 06/21/2020   HCT 42.0 06/21/2020   MCV 94.9 06/21/2020   MCV 96 06/21/2020   PLT 167.0 06/21/2020   PLT 179 06/21/2020   Lab Results  Component Value Date   IRON 91 12/07/2019   TIBC 255 12/07/2019   FERRITIN 147 12/07/2019    Obesity Behavioral Intervention:   Approximately 15 minutes were spent on the discussion below.  ASK: We discussed the diagnosis of obesity with Heather [name] today and Heather [name] agreed to give [name] permission to discuss obesity behavioral modification therapy today.  ASSESS: Heather [name] has the diagnosis of obesity and her BMI today is 33.6. Heather [name] is in the action stage of change.    ADVISE: Heather [name] was educated on the multiple health risks of obesity as well as the benefit of weight loss to improve her health. She was advised of the need for long term treatment and the importance of lifestyle modifications to improve her current health and to decrease her risk of future health problems.  AGREE: Multiple dietary modification options and treatment options were discussed and Yaneli agreed to follow the recommendations documented in the above note.  ARRANGE: Saja was educated on the importance of frequent visits to treat obesity as outlined per CMS and USPSTF guidelines and agreed to schedule her next follow up appointment today.  Attestation Statements:   Reviewed by clinician on day of visit: allergies, medications, problem list, medical history, surgical history, family history, social history, and previous encounter notes.   I, [name], am acting as transcriptionist for [name], MD.  I have reviewed the above documentation for accuracy and  completeness, and I agree with the above. -  [name], MD

## 2020-11-27 ENCOUNTER — Ambulatory Visit (INDEPENDENT_AMBULATORY_CARE_PROVIDER_SITE_OTHER): Payer: Medicare Other

## 2020-11-27 VITALS — Ht 65.0 in | Wt 202.0 lb

## 2020-11-27 DIAGNOSIS — Z0000 Encounter for general adult medical examination without abnormal findings: Secondary | ICD-10-CM | POA: Diagnosis not present

## 2020-11-27 LAB — LIPID PANEL WITH LDL/HDL RATIO
Cholesterol, Total: 149 mg/dL (ref 100–199)
HDL: 62 mg/dL (ref >39)
LDL Chol Calc (NIH): 73 mg/dL (ref 0–99)
LDL/HDL Ratio: 1.2 ratio (ref 0.0–3.2)
Triglycerides: 73 mg/dL (ref 0–149)
VLDL Cholesterol Cal: 14 mg/dL (ref 5–40)

## 2020-11-27 LAB — VITAMIN D 25 HYDROXY (VIT D DEFICIENCY, FRACTURES): Vit D, 25-Hydroxy: 66.2 ng/mL (ref 30.0–100.0)

## 2020-11-27 LAB — HEMOGLOBIN A1C
Est. average glucose Bld gHb Est-mCnc: 128 mg/dL
Hgb A1c MFr Bld: 6.1 % — ABNORMAL HIGH (ref 4.8–5.6)

## 2020-11-27 LAB — CMP14+EGFR
ALT: 13 IU/L (ref 0–32)
AST: 18 IU/L (ref 0–40)
Albumin/Globulin Ratio: 1.4 (ref 1.2–2.2)
Albumin: 4.2 g/dL (ref 3.7–4.7)
Alkaline Phosphatase: 60 IU/L (ref 44–121)
BUN/Creatinine Ratio: 19 (ref 12–28)
BUN: 15 mg/dL (ref 8–27)
Bilirubin Total: <0.2 mg/dL (ref 0.0–1.2)
CO2: 23 mmol/L (ref 20–29)
Calcium: 9.1 mg/dL (ref 8.7–10.3)
Chloride: 103 mmol/L (ref 96–106)
Creatinine, Ser: 0.78 mg/dL (ref 0.57–1.00)
Globulin, Total: 3.1 g/dL (ref 1.5–4.5)
Glucose: 88 mg/dL (ref 70–99)
Potassium: 3.9 mmol/L (ref 3.5–5.2)
Sodium: 141 mmol/L (ref 134–144)
Total Protein: 7.3 g/dL (ref 6.0–8.5)
eGFR: 80 mL/min/{1.73_m2} (ref >59)

## 2020-11-27 LAB — CBC WITH DIFFERENTIAL/PLATELET
Basophils Absolute: 0 10*3/uL (ref 0.0–0.2)
Basos: 1 %
EOS (ABSOLUTE): 0.1 10*3/uL (ref 0.0–0.4)
Eos: 2 %
Hematocrit: 43.2 % (ref 34.0–46.6)
Hemoglobin: 13.7 g/dL (ref 11.1–15.9)
Immature Grans (Abs): 0 10*3/uL (ref 0.0–0.1)
Immature Granulocytes: 0 %
Lymphocytes Absolute: 1.2 10*3/uL (ref 0.7–3.1)
Lymphs: 36 %
MCH: 30.9 pg (ref 26.6–33.0)
MCHC: 31.7 g/dL (ref 31.5–35.7)
MCV: 98 fL — ABNORMAL HIGH (ref 79–97)
Monocytes Absolute: 0.4 10*3/uL (ref 0.1–0.9)
Monocytes: 11 %
Neutrophils Absolute: 1.7 10*3/uL (ref 1.4–7.0)
Neutrophils: 50 %
Platelets: 209 10*3/uL (ref 150–450)
RBC: 4.43 x10E6/uL (ref 3.77–5.28)
RDW: 13 % (ref 11.7–15.4)
WBC: 3.4 10*3/uL (ref 3.4–10.8)

## 2020-11-27 LAB — LIPASE: Lipase: 16 U/L (ref 14–85)

## 2020-11-27 LAB — AMYLASE: Amylase: 53 U/L (ref 31–110)

## 2020-11-27 LAB — INSULIN, RANDOM: INSULIN: 6 u[IU]/mL (ref 2.6–24.9)

## 2020-11-27 NOTE — Patient Instructions (Addendum)
  Heather [name] , Thank you for taking time to come for your Medicare Wellness Visit. I appreciate your ongoing commitment to your health goals. Please review the following plan we discussed and let me know if I can assist you in the future.   These are the goals we discussed:  Goals       Patient Stated     Follow up with Provider as scheduled (pt-stated)      I plan to create a schedule with pain medication to help me stay ahead of the pain and with bladder control.        Weight (lb) < 199 lb (90.3 kg) (pt-stated)      Increase physical activity with post physical therapy exercises, use of elliptical and/or water aerobics        This is a list of the screening recommended for you and due dates:  Health Maintenance  Topic Date Due   COVID-19 Vaccine (5 - Booster for Pfizer series) 12/13/2020*   Zoster (Shingles) Vaccine (1 of 2) 02/26/2021*   Flu Shot  05/30/2021*   Hemoglobin A1C  05/25/2021   Colon Cancer Screening  06/18/2021   Urine Protein Check  06/21/2021   Mammogram  07/02/2021   Eye exam for diabetics  07/23/2021   Complete foot exam   09/26/2021   Tetanus Vaccine  01/31/2024   DEXA scan (bone density measurement)  Completed   Hepatitis C Screening: USPSTF Recommendation to screen - Ages 63-79 yo.  Completed   HPV Vaccine  Aged Out  *Topic was postponed. The date shown is not the original due date.

## 2020-11-27 NOTE — Progress Notes (Addendum)
Subjective:   Heather [name] is a 75 y.o. female who presents for Medicare Annual (Subsequent) preventive examination.  Review of Systems    No ROS.  Medicare Wellness Virtual Visit.  Visual/audio telehealth visit, UTA vital signs.   See social history for additional risk factors.   Cardiac Risk Factors include: advanced age (>31mn, >>8women);diabetes mellitus;hypertension     Objective:    Today's Vitals   11/27/20 1056  Weight: 202 lb (91.6 kg)  Height: 5' 5"  (1.651 m)   Body mass index is 33.61 kg/m.  Advanced Directives 11/27/2020 12/07/2019 11/27/2019 11/24/2018 03/16/2017 03/15/2017 06/18/2016  Does Patient Have a Medical Advance Directive? Yes No No No No No No  Type of AParamedicof AEvansLiving will - - - - - -  Does patient want to make changes to medical advance directive? No - Patient declined - - - - - -  Copy of HWamicin Chart? No - copy requested - - - - - -  Would patient like information on creating a medical advance directive? - No - Patient declined No - Patient declined Yes (MAU/Ambulatory/Procedural Areas - Information given) No - Patient declined - -    Current Medications (verified) Outpatient Encounter Medications as of 11/27/2020  Medication Sig   acetaminophen (TYLENOL) 500 MG tablet Take 650 mg by mouth 3 (three) times daily as needed for pain.    Calcium-Vitamin D-Vitamin K (VIACTIV CALCIUM PLUS D PO) Take by mouth.   cetirizine (ZYRTEC) 10 MG tablet Take 10 mg by mouth daily.   clopidogrel (PLAVIX) 75 MG tablet Take 1 tablet (75 mg total) by mouth daily. Needs appt for future refills   COVID-19 mRNA Vac-TriS, Pfizer, (PFIZER-BIONT COVID-19 VAC-TRIS) SUSP injection Inject into the muscle.   Cyanocobalamin 1000 MCG CAPS Take 1 capsule by mouth daily.   diclofenac Sodium (VOLTAREN) 1 % GEL Apply topically 4 (four) times daily.   ezetimibe (ZETIA) 10 MG tablet Take 1 tablet (10 mg total) by mouth daily.    Ferrous Sulfate (IRON) 325 (65 FE) MG TABS Take 1 tablet by mouth daily.   furosemide (LASIX) 40 MG tablet TAKE 1 TABLET DAILY AS     NEEDED   gabapentin (NEURONTIN) 100 MG capsule Take 3 pills at bedtime   glucosamine-chondroitin 500-400 MG tablet Take 1 tablet by mouth 2 (two) times daily.   Multiple Vitamin (MULTIVITAMIN) tablet Take 1 tablet by mouth daily.   Omega-3 Fatty Acids (FISH OIL) 1000 MG CAPS Take 1 capsule by mouth daily.   pantoprazole (PROTONIX) 20 MG tablet TAKE 1 TABLET DAILY 30     MINUTES BEFORE FOOD        (DISCONTINUE DEXILANT,     DISCONTINUE 40MG DOSE)   polyethylene glycol (MIRALAX / GLYCOLAX) 17 g packet Take 17 g by mouth daily as needed.   potassium chloride SA (KLOR-CON M20) 20 MEQ tablet Take 1 tablet (20 mEq total) by mouth daily.   pyridOXINE (VITAMIN B-6) 100 MG tablet Take 200 mg by mouth daily.   rosuvastatin (CRESTOR) 40 MG tablet Take 1 tablet (40 mg total) by mouth daily. At night   SF 5000 PLUS 1.1 % CREA dental cream    topiramate (TOPAMAX) 25 MG tablet Take 1 tablet (25 mg total) by mouth at bedtime.   traMADol (ULTRAM) 50 MG tablet Take 1 tablet (50 mg total) by mouth every 12 (twelve) hours as needed.   Turmeric 500 MG CAPS Take 1 capsule  by mouth daily.   UNABLE TO FIND Instaflex for knees   Vitamin D, Ergocalciferol, (DRISDOL) 1.25 MG (50000 UNIT) CAPS capsule Take 1 capsule (50,000 Units total) by mouth every 7 (seven) days.   vitamin E 200 UNIT capsule Take 200 Units by mouth daily.   No facility-administered encounter medications on file as of 11/27/2020.    Allergies (verified) Aspirin, Coconut oil, Lisinopril, Metoprolol, Peanut-containing drug products, Penicillins, Prednisone, and Strawberry extract   History: Past Medical History:  Diagnosis Date   Abdominal hernia    Abnormal Pap smear    ALLERGIC RHINITIS 10/22/2006   Allergy    ANEMIA 12/18/2008   Arthritis    scoliosis moderate deg changes lumbar Xray 11/04/17    ASYMPTOMATIC  POSTMENOPAUSAL STATUS 11/22/2007   Back pain    CAD (coronary artery disease)    in LAD   CHF (congestive heart failure) (HCC)    Complication of anesthesia    Degenerative arthritis    DIABETES MELLITUS, TYPE II 01/13/2007   Diverticulosis    Dyslipidemia    Fibroid    Frequent headaches    GERD 07/20/2007   GOITER, MULTINODULAR 07/20/2007   Headache(784.0) 07/20/2007   HEARING LOSS 11/22/2007   Hiatal hernia    Hiatal hernia    large   History of chicken pox    Hx of colposcopy with cervical biopsy    HYPERCHOLESTEROLEMIA 01/13/2007   Hyperglycemia    HYPERTENSION 10/22/2006   Lung nodule    unchanged since 05/26/13    Migraines    NASH (nonalcoholic steatohepatitis)    Nocturnal hypoxemia 02/17/2013   Obesity    Obstructive sleep apnea    OSA on CPAP    per neurology   OSTEOARTHRITIS 10/22/2006   Other chronic nonalcoholic liver disease 2/50/0370   Sedimentation rate elevation    Sleep apnea    on cpap   Urine incontinence    UTI (urinary tract infection)    Past Surgical History:  Procedure Laterality Date   BREAST SURGERY  1984   Breast reduction b/l    CATARACT EXTRACTION, BILATERAL     DEXA  08/2005   DILATION AND CURETTAGE OF UTERUS     ELECTROCARDIOGRAM  10/15/2006   ESOPHAGOGASTRODUODENOSCOPY  12/08/2005   FOOT SURGERY     hammertoe and bunion 05/2017    Stress Cardiolite  10/21/2005   sweat gland removal     WISDOM TOOTH EXTRACTION     Family History  Problem Relation Age of Onset   Asthma Mother    Depression Mother    Bipolar disorder Mother    Dementia Mother    Arthritis Mother    Breast cancer Mother        primary   Colon cancer Mother        mets from breast   Cancer Mother        colon   Hypertension Father    Migraines Father    Cancer Maternal Aunt        ?   Heart disease Maternal Grandmother    Cancer Maternal Grandfather        stomach ?   Social History   Socioeconomic History   Marital status: Married    Spouse name:  [name]   Number of children: 2   Years of education: College   Highest education level: Not on file  Occupational History   Occupation: Retired  Tobacco Use   Smoking status: Former    Packs/day: 2.00  Years: 20.00    Pack years: 40.00    Types: Cigarettes    Quit date: 03/03/1979    Years since quitting: 41.7   Smokeless tobacco: Never  Substance and Sexual Activity   Alcohol use: Yes    Comment: rare   Drug use: No   Sexual activity: Yes    Birth control/protection: Surgical  Other Topics Concern   Not on file  Social History Narrative   Patient is married [name]).   Right Handed   Drinks 1 cup caffeine   Social Determinants of Health   Financial Resource Strain: Low Risk    Difficulty of Paying Living Expenses: Not hard at all  Food Insecurity: No Food Insecurity   Worried About Charity fundraiser in the Last Year: Never true   Arboriculturist in the Last Year: Never true  Transportation Needs: No Transportation Needs   Lack of Transportation (Medical): No   Lack of Transportation (Non-Medical): No  Physical Activity: Not on file  Stress: No Stress Concern Present   Feeling of Stress : Not at all  Social Connections: Unknown   Frequency of Communication with Friends and Family: Not on file   Frequency of Social Gatherings with Friends and Family: Not on file   Attends Religious Services: Not on file   Active Member of Clubs or Organizations: Not on file   Attends Archivist Meetings: Not on file   Marital Status: Married    Tobacco Counseling Counseling given: Not Answered   Clinical Intake:  Pre-visit preparation completed: Yes       Diabetes Management: Does the patient want to be seen by Chronic Care Management for management of their diabetes?  No  Would the patient like to be referred to a Nutritionist or for Diabetic Management?  No   How often do you need to have someone help you when you read instructions, pamphlets, or other  written materials from your doctor or pharmacy?: 1 - Never   Interpreter Needed?: No      Activities of Daily Living In your present state of health, do you have any difficulty performing the following activities: 11/27/2020  Hearing? N  Vision? N  Difficulty concentrating or making decisions? N  Walking or climbing stairs? Y  Comment Paces self  Dressing or bathing? N  Doing errands, shopping? N  Preparing Food and eating ? N  Using the Toilet? N  In the past six months, have you accidently leaked urine? Y  Comment Managed with daily liner/pad  Do you have problems with loss of bowel control? N  Managing your Medications? N  Managing your Finances? N  Housekeeping or managing your Housekeeping? N  Comment Paces self  Some recent data might be hidden    Patient Care Team: McLean-Scocuzza, [name], MD as PCP - General (Internal Medicine) Larey Dresser, MD as PCP - Cardiology (Cardiology) Kathrynn Ducking, MD (Neurology) Haygood, Seymour Bars, MD (Inactive) (Obstetrics and Gynecology) [place], Heather, [place] (Optometry) Brand Males, MD as Consulting Physician (Pulmonary Disease) Lahoma Rocker, MD as Referring Physician (Rheumatology) Larey Dresser, MD as Consulting Physician (Cardiology)  Indicate any recent Medical Services you may have received from other than [facility] providers in the past year (date may be approximate).     Assessment:   This is a routine wellness examination for Jazae.  I connected with [name] today by telephone and verified that I am speaking with the correct person using two identifiers. Location  patient: home Location provider: work Persons participating in the virtual visit: patient, Marine scientist.    I discussed the limitations, risks, security and privacy concerns of performing an evaluation and management service by telephone and the availability of in person appointments. The patient expressed understanding and verbally consented to this telephonic  visit.    Interactive audio and video telecommunications were attempted between this provider and patient, however failed, due to patient having technical difficulties OR patient did not have access to video capability.  We continued and completed visit with audio only.  Some vital signs may be absent or patient reported.   Hearing/Vision screen Hearing Screening - Comments:: Patient is able to hear conversational tones without difficulty. No issues reported. Vision Screening - Comments:: Wears reading glasses  Cataract extraction, bilateral  They have seen their ophthalmologist in the last 12 months.   Dietary issues and exercise activities discussed: Current Exercise Habits: Home exercise routine, Intensity: Mild  Attends Healthy Weight Program Healthy diet Good water intake   Goals Addressed               This Visit's Progress     Patient Stated     Weight (lb) < 199 lb (90.3 kg) (pt-stated)   202 lb (91.6 kg)     Increase physical activity with post physical therapy exercises, use of elliptical and/or water aerobics       Depression Screen PHQ 2/9 Scores 11/27/2020 03/12/2020 11/27/2019 11/08/2019 04/27/2019 11/24/2018 01/05/2018  PHQ - 2 Score 0 1 0 0 0 0 0  PHQ- 9 Score - 1 - - - - -    Fall Risk Fall Risk  11/27/2020 07/02/2020 03/12/2020 11/27/2019 11/08/2019  Falls in the past year? 0 0 0 0 0  Number falls in past yr: 0 0 0 0 0  Injury with Fall? - 0 0 - 0  Follow up Falls evaluation completed Falls evaluation completed Falls evaluation completed Falls evaluation completed Falls evaluation completed    [place]: Adequate lighting in your home to reduce risk of falls? Yes   ASSISTIVE DEVICES UTILIZED TO PREVENT FALLS: Life alert? No  Use of a cane, walker or w/c? Yes  as needed  TIMED UP AND GO: Was the test performed? No .   Cognitive Function: Patient is alert and oriented x3.  Denies difficulty focusing, making decisions.   Enjoys reading, completing word puzzles and other brain health activity.  MMSE - Mini Mental State Exam 08/12/2020 02/08/2020  Orientation to time 5 5  Orientation to Place 5 5  Registration 3 3  Attention/ Calculation 5 5  Recall 3 3  Language- name 2 objects 2 2  Language- repeat 1 1  Language- follow 3 step command 3 3  Language- read & follow direction 1 1  Write a sentence 1 1  Copy design 1 1  Total score 30 30     6CIT Screen 11/24/2018  What Year? 0 points  What month? 0 points  What time? 0 points  Count back from 20 0 points  Months in reverse 0 points  Repeat phrase 0 points  Total Score 0    Immunizations Immunization History  Administered Date(s) Administered   Fluad Quad(high Dose 65+) 12/13/2018, 11/08/2019   Influenza Split 12/25/2010, 12/28/2011   Influenza Whole 12/18/2008, 12/03/2009   Influenza, High Dose Seasonal PF 03/05/2016, 12/29/2016, 01/05/2018   Influenza,inj,Quad PF,6+ Mos 01/24/2013, 01/30/2014, 02/15/2015   PFIZER Comirnaty(Gray Top)Covid-19 Tri-Sucrose Vaccine 06/28/2020  PFIZER(Purple Top)SARS-COV-2 Vaccination 04/06/2019, 04/27/2019, 12/07/2019   Pneumococcal Conjugate-13 01/30/2014   Pneumococcal Polysaccharide-23 12/18/2008, 01/18/2019   Tdap 01/30/2014   Zoster, Live 01/30/2014   Shingrix vaccine- Due, Education has been provided regarding the importance of this vaccine. Advised may receive this vaccine at local pharmacy or Health Dept. Aware to provide a copy of the vaccination record if obtained from local pharmacy or Health Dept. Verbalized acceptance and understanding  Influenza vaccine- plans to get later in the season  Health Maintenance Health Maintenance  Topic Date Due   COVID-19 Vaccine (5 - Booster for [place] series) 12/13/2020 (Originally 10/28/2020)   Zoster Vaccines- Shingrix (1 of 2) 02/26/2021 (Originally 10/22/1964)   INFLUENZA VACCINE  05/30/2021 (Originally 09/30/2020)   HEMOGLOBIN A1C  05/25/2021   COLONOSCOPY  (Pts 45-66yr Insurance coverage will need to be confirmed)  06/18/2021   URINE MICROALBUMIN  06/21/2021   MAMMOGRAM  07/02/2021   OPHTHALMOLOGY EXAM  07/23/2021   FOOT EXAM  09/26/2021   TETANUS/TDAP  01/31/2024   DEXA SCAN  Completed   Hepatitis C Screening  Completed   HPV VACCINES  Aged Out   Vision Screening: Recommended annual ophthalmology exams for early detection of glaucoma and other disorders of the eye.  Dental Screening: Recommended annual dental exams for proper oral hygiene  Community Resource Referral / Chronic Care Management: CRR required this visit?  No   CCM required this visit?  No      Plan:   Keep all routine maintenance appointments.   I have personally reviewed and noted the following in the patient's chart:   Medical and social history Use of alcohol, tobacco or illicit drugs  Current medications and supplements including opioid prescriptions. Not taking opioid.  Functional ability and status Nutritional status Physical activity Advanced directives List of other physicians Hospitalizations, surgeries, and ER visits in previous 12 months Vitals Screenings to include cognitive, depression, and falls Referrals and appointments  In addition, I have reviewed and discussed with patient certain preventive protocols, quality metrics, and best practice recommendations. A written personalized care plan for preventive services as well as general preventive health recommendations were provided to patient via mychart.     OBrien-Blaney, Marylee Belzer L, LPN   97/06/8887   I have reviewed the above information and agree with above.   TDeborra Medina MD

## 2020-12-09 NOTE — Progress Notes (Signed)
[place] [facility] [facility] Phone: [phone] Subjective:   [name], am serving as a scribe for Dr. Hulan Saas. This visit occurred during the SARS-CoV-2 public health emergency.  Safety protocols were in place, including screening questions prior to the visit, additional usage of staff PPE, and extensive cleaning of exam room while observing appropriate contact time as indicated for disinfecting solutions.   I'm seeing this patient by the request  of:  McLean-Scocuzza, [name], MD  CC: Bilateral knee pain  VVO:[id number]  10/01/2020 End-stage arthritis.  He did respond well to the steroid injections previously.  Patient hopefully will have another 8 to 10 weeks of improvement.  Discussed the patient could be a possible viscosupplementation again in November.  Increase activity slowly.  Encourage patient to continue to monitor signs and symptoms.  Patient wants to avoid any surgical intervention but patient's BMI is under 35 so would not be a surgical candidate at this moment if necessary.  Follow-up with me again in 10 to 12 weeks.  Update 12/10/2020 CATALEA LABRECQUE is a 75 y.o. female coming in with complaint of B knee pain. Patient states that her pain has increased in both knees. States that her pain is bad enough where she is considering speaking with a Psychologist, sport and exercise. L>R.  Patient states that it is affecting Daily activities on a regular basis.  Patient states that she has more pain at night as well.  Patient is not going to be traveling with her husband and now secondary to the amount of pain patient is having as well.      Past Medical History:  Diagnosis Date   Abdominal hernia    Abnormal Pap smear    ALLERGIC RHINITIS 10/22/2006   Allergy    ANEMIA 12/18/2008   Arthritis    scoliosis moderate deg changes lumbar Xray 11/04/17    ASYMPTOMATIC POSTMENOPAUSAL STATUS 11/22/2007   Back pain    CAD (coronary artery disease)     in LAD   CHF (congestive heart failure) (HCC)    Complication of anesthesia    Degenerative arthritis    DIABETES MELLITUS, TYPE II 01/13/2007   Diverticulosis    Dyslipidemia    Fibroid    Frequent headaches    GERD 07/20/2007   GOITER, MULTINODULAR 07/20/2007   Headache(784.0) 07/20/2007   HEARING LOSS 11/22/2007   Hiatal hernia    Hiatal hernia    large   History of chicken pox    Hx of colposcopy with cervical biopsy    HYPERCHOLESTEROLEMIA 01/13/2007   Hyperglycemia    HYPERTENSION 10/22/2006   Lung nodule    unchanged since 05/26/13    Migraines    NASH (nonalcoholic steatohepatitis)    Nocturnal hypoxemia 02/17/2013   Obesity    Obstructive sleep apnea    OSA on CPAP    per neurology   OSTEOARTHRITIS 10/22/2006   Other chronic nonalcoholic liver disease 6/94/8546   Sedimentation rate elevation    Sleep apnea    on cpap   Urine incontinence    UTI (urinary tract infection)    Past Surgical History:  Procedure Laterality Date   BREAST SURGERY  1984   Breast reduction b/l    CATARACT EXTRACTION, BILATERAL     DEXA  08/2005   DILATION AND CURETTAGE OF UTERUS     ELECTROCARDIOGRAM  10/15/2006   ESOPHAGOGASTRODUODENOSCOPY  12/08/2005   FOOT SURGERY     hammertoe and bunion  05/2017    Stress Cardiolite  10/21/2005   sweat gland removal     WISDOM TOOTH EXTRACTION     Social History   Socioeconomic History   Marital status: Married    Spouse name: [name]   Number of children: 2   Years of education: College   Highest education level: Not on file  Occupational History   Occupation: Retired  Tobacco Use   Smoking status: Former    Packs/day: 2.00    Years: 20.00    Pack years: 40.00    Types: Cigarettes    Quit date: 03/03/1979    Years since quitting: 41.8   Smokeless tobacco: Never  Substance and Sexual Activity   Alcohol use: Yes    Comment: rare   Drug use: No   Sexual activity: Yes    Birth control/protection: Surgical  Other Topics Concern    Not on file  Social History Narrative   Patient is married [name]).   Right Handed   Drinks 1 cup caffeine   Social Determinants of Health   Financial Resource Strain: Low Risk    Difficulty of Paying Living Expenses: Not hard at all  Food Insecurity: No Food Insecurity   Worried About Charity fundraiser in the Last Year: Never true   Arboriculturist in the Last Year: Never true  Transportation Needs: No Transportation Needs   Lack of Transportation (Medical): No   Lack of Transportation (Non-Medical): No  Physical Activity: Not on file  Stress: No Stress Concern Present   Feeling of Stress : Not at all  Social Connections: Unknown   Frequency of Communication with Friends and Family: Not on file   Frequency of Social Gatherings with Friends and Family: Not on file   Attends Religious Services: Not on file   Active Member of Clubs or Organizations: Not on file   Attends Archivist Meetings: Not on file   Marital Status: Married   Allergies  Allergen Reactions   Aspirin    Coconut Oil    Lisinopril     REACTION: cough   Metoprolol Itching   Peanut-Containing Drug Products    Penicillins     REACTION: rash   Prednisone    Strawberry Extract    Family History  Problem Relation Age of Onset   Asthma Mother    Depression Mother    Bipolar disorder Mother    Dementia Mother    Arthritis Mother    Breast cancer Mother        primary   Colon cancer Mother        mets from breast   Cancer Mother        colon   Hypertension Father    Migraines Father    Cancer Maternal Aunt        ?   Heart disease Maternal Grandmother    Cancer Maternal Grandfather        stomach ?     Current Outpatient Medications (Cardiovascular):    ezetimibe (ZETIA) 10 MG tablet, Take 1 tablet (10 mg total) by mouth daily.   furosemide (LASIX) 40 MG tablet, TAKE 1 TABLET DAILY AS     NEEDED   rosuvastatin (CRESTOR) 40 MG tablet, Take 1 tablet (40 mg total) by mouth daily. At  night  Current Outpatient Medications (Respiratory):    cetirizine (ZYRTEC) 10 MG tablet, Take 10 mg by mouth daily.  Current Outpatient Medications (Analgesics):    acetaminophen (TYLENOL) 500  MG tablet, Take 650 mg by mouth 3 (three) times daily as needed for pain.    traMADol (ULTRAM) 50 MG tablet, Take 1 tablet (50 mg total) by mouth every 12 (twelve) hours as needed.  Current Outpatient Medications (Hematological):    clopidogrel (PLAVIX) 75 MG tablet, Take 1 tablet (75 mg total) by mouth daily. Needs appt for future refills   Cyanocobalamin 1000 MCG CAPS, Take 1 capsule by mouth daily.   Ferrous Sulfate (IRON) 325 (65 FE) MG TABS, Take 1 tablet by mouth daily.  Current Outpatient Medications (Other):    Calcium-Vitamin D-Vitamin K (VIACTIV CALCIUM PLUS D PO), Take by mouth.   COVID-19 mRNA Vac-TriS, Pfizer, (PFIZER-BIONT COVID-19 VAC-TRIS) SUSP injection, Inject into the muscle.   diclofenac Sodium (VOLTAREN) 1 % GEL, Apply topically 4 (four) times daily.   gabapentin (NEURONTIN) 100 MG capsule, Take 3 pills at bedtime   glucosamine-chondroitin 500-400 MG tablet, Take 1 tablet by mouth 2 (two) times daily.   Multiple Vitamin (MULTIVITAMIN) tablet, Take 1 tablet by mouth daily.   Omega-3 Fatty Acids (FISH OIL) 1000 MG CAPS, Take 1 capsule by mouth daily.   pantoprazole (PROTONIX) 20 MG tablet, TAKE 1 TABLET DAILY 30     MINUTES BEFORE FOOD        (DISCONTINUE DEXILANT,     DISCONTINUE 40MG DOSE)   polyethylene glycol (MIRALAX / GLYCOLAX) 17 g packet, Take 17 g by mouth daily as needed.   potassium chloride SA (KLOR-CON M20) 20 MEQ tablet, Take 1 tablet (20 mEq total) by mouth daily.   pyridOXINE (VITAMIN B-6) 100 MG tablet, Take 200 mg by mouth daily.   SF 5000 PLUS 1.1 % CREA dental cream,    topiramate (TOPAMAX) 25 MG tablet, Take 1 tablet (25 mg total) by mouth at bedtime.   Turmeric 500 MG CAPS, Take 1 capsule by mouth daily.   UNABLE TO FIND, Instaflex for knees   Vitamin D,  Ergocalciferol, (DRISDOL) 1.25 MG (50000 UNIT) CAPS capsule, Take 1 capsule (50,000 Units total) by mouth every 7 (seven) days.   vitamin E 200 UNIT capsule, Take 200 Units by mouth daily.   Reviewed prior external information including notes and imaging from  primary care provider As well as notes that were available from care everywhere and other healthcare systems.  Past medical history, social, surgical and family history all reviewed in electronic medical record.  No pertanent information unless stated regarding to the chief complaint.   Review of Systems:  No headache, visual changes, nausea, vomiting, diarrhea, constipation, dizziness, abdominal pain, skin rash, fevers, chills, night sweats, weight loss, swollen lymph nodes, body aches, joint swelling, chest pain, shortness of breath, mood changes. POSITIVE muscle aches  Objective  Blood pressure 118/62, pulse 63, height 5' 5"  (1.651 m), weight 213 lb (96.6 kg), SpO2 97 %.   General: No apparent distress alert and oriented x3 mood and affect normal, dressed appropriately.  HEENT: Pupils equal, extraocular movements intact  Respiratory: Patient's speak in full sentences and does not appear short of breath  Cardiovascular: Trace lower extremity edema, non tender, no erythema  Gait antalgic gait using a cane Knee: Bilateral valgus deformity noted.  Abnormal thigh to calf ratio.  Tender to palpation over medial and PF joint line.  Decreased extension of 5 degrees and flexion of 10 degrees instability with valgus force.  painful patellar compression. Patellar glide with moderate crepitus. Patellar and quadriceps tendons unremarkable. Hamstring and quadriceps strength is normal.  After informed written and  verbal consent, patient was seated on exam table. Right knee was prepped with alcohol swab and utilizing anterolateral approach, patient's right knee space was injected with 48 mg per 3 mL of Monovisc (sodium hyaluronate) in a  prefilled syringe was injected easily into the knee through a 22-gauge needle..Patient tolerated the procedure well without immediate complications.  After informed written and verbal consent, patient was seated on exam table. Left knee was prepped with alcohol swab and utilizing anterolateral approach, patient's left knee space was injected with 48 mg per 3 mL of Monovisc (sodium hyaluronate) in a prefilled syringe was injected easily into the knee through a 22-gauge needle..Patient tolerated the procedure well without immediate complications.   Impression and Recommendations:     The above documentation has been reviewed and is accurate and complete Lyndal Pulley, DO

## 2020-12-10 ENCOUNTER — Other Ambulatory Visit: Payer: Self-pay

## 2020-12-10 ENCOUNTER — Ambulatory Visit (INDEPENDENT_AMBULATORY_CARE_PROVIDER_SITE_OTHER): Payer: Medicare Other | Admitting: Family Medicine

## 2020-12-10 ENCOUNTER — Encounter: Payer: Self-pay | Admitting: Family Medicine

## 2020-12-10 VITALS — BP 118/62 | HR 63 | Ht 65.0 in | Wt 213.0 lb

## 2020-12-10 DIAGNOSIS — M170 Bilateral primary osteoarthritis of knee: Secondary | ICD-10-CM

## 2020-12-10 DIAGNOSIS — I2510 Atherosclerotic heart disease of native coronary artery without angina pectoris: Secondary | ICD-10-CM

## 2020-12-10 NOTE — Patient Instructions (Addendum)
Dr. Ninfa Linden with [facility] will call to schedule Injected Monovisc today See me again in 8-10 weeks

## 2020-12-10 NOTE — Assessment & Plan Note (Signed)
Patient has done relatively well over the course of the last 6 years.  The patient has done well with the steroid injections as well as the viscosupplementation which was given again today.  Patient has also lost weight and has done conservative therapy including formal physical therapy but at this point he is now having worsening pain and exacerbation on a regular basis of this chronic problem.  Patient BMI is at 5 and patient is a candidate now for replacement.  Patient will be referred to orthopedic surgery to discuss surgical intervention.  Patient knows that I am here if she has any other questions.

## 2020-12-17 ENCOUNTER — Ambulatory Visit (INDEPENDENT_AMBULATORY_CARE_PROVIDER_SITE_OTHER): Payer: Medicare Other | Admitting: Family Medicine

## 2020-12-17 ENCOUNTER — Other Ambulatory Visit: Payer: Self-pay

## 2020-12-17 ENCOUNTER — Encounter (INDEPENDENT_AMBULATORY_CARE_PROVIDER_SITE_OTHER): Payer: Self-pay | Admitting: Family Medicine

## 2020-12-17 VITALS — BP 127/75 | HR 61 | Temp 98.1°F | Ht 65.0 in | Wt 211.0 lb

## 2020-12-17 DIAGNOSIS — E559 Vitamin D deficiency, unspecified: Secondary | ICD-10-CM

## 2020-12-17 DIAGNOSIS — Z6841 Body Mass Index (BMI) 40.0 and over, adult: Secondary | ICD-10-CM | POA: Diagnosis not present

## 2020-12-17 DIAGNOSIS — M170 Bilateral primary osteoarthritis of knee: Secondary | ICD-10-CM | POA: Diagnosis not present

## 2020-12-17 MED ORDER — VITAMIN D (ERGOCALCIFEROL) 1.25 MG (50000 UNIT) PO CAPS: 50000.0000 [IU] | ORAL_CAPSULE | ORAL | 0 refills | Status: DC

## 2020-12-17 NOTE — Progress Notes (Signed)
Chief Complaint:   OBESITY Heather [name] is here to discuss her progress with her obesity treatment plan along with follow-up of her obesity related diagnoses. Heather [name] is on the Category 2 Plan and states she is following her eating plan approximately 0% of the time. Heather [name] states she is doing 0 minutes 067 times per week.  Today's visit was #: 23 Starting weight: 268 lbs Starting date: 12/23/2016 Today's weight: 211 lbs Today's date: 12/17/2020 Total lbs lost to date: 78 Total lbs lost since last in-office visit: 0  Interim History: Heather [name] has had some significant weight swings for the last couple of visits. She had food poisoning and dehydration previously, and she is now up in weight which is likely from improved hydration and decreased RMR from being sick. She is unable to exercise due to knee pain.  Subjective:   1. Vitamin D deficiency Heather [name] is stable on Vit D, and she denies signs of over-replacement.  2. Osteoarthritis of both knees, unspecified osteoarthritis type Heather [name] notes her pain has been worsening despite multiple treatments. She is unable to exercise much due to her pain. She is considering having knee replacement surgery.  Assessment/Plan:   1. Vitamin D deficiency Low Vitamin D level contributes to fatigue and are associated with obesity, breast, and colon cancer. We will refill prescription Vitamin D for 1 month. Heather [name] will follow-up for routine testing of Vitamin D, at least 2-3 times per year to avoid over-replacement.  - Vitamin D, Ergocalciferol, (DRISDOL) 1.25 MG (50000 UNIT) CAPS capsule; Take 1 capsule (50,000 Units total) by mouth every 7 (seven) days.  Dispense: 4 capsule; Refill: 0  2. Osteoarthritis of both knees, unspecified osteoarthritis type Heather [name] is to continue to use her knees enough to help with arthritis and she will schedule with Ortho.  3. Obesity whit current BMI 35.2 Heather [name] is currently in the action stage of change. As such, her goal is to  continue with weight loss efforts. She has agreed to the Category 2 Plan or keeping a food journal and adhering to recommended goals of 1200 calories and 75+ grams of protein daily.   Higher protein food options were discussed.  Behavioral modification strategies: increasing lean protein intake and meal planning and cooking strategies.  Heather [name] has agreed to follow-up with our clinic in 3 to 4 weeks. She was informed of the importance of frequent follow-up visits to maximize her success with intensive lifestyle modifications for her multiple health conditions.   Objective:   Blood pressure 127/75, pulse 61, temperature 98.1 F (36.7 C), height 5' 5"  (1.651 m), weight 211 lb (95.7 kg), SpO2 97 %. Body mass index is 35.11 kg/m.  General: Cooperative, alert, well developed, in no acute distress. HEENT: Conjunctivae and lids unremarkable. Cardiovascular: Regular rhythm.  Lungs: Normal work of breathing. Neurologic: No focal deficits.   Lab Results  Component Value Date   CREATININE 0.78 11/25/2020   BUN 15 11/25/2020   NA 141 11/25/2020   K 3.9 11/25/2020   CL 103 11/25/2020   CO2 23 11/25/2020   Lab Results  Component Value Date   ALT 13 11/25/2020   AST 18 11/25/2020   ALKPHOS 60 11/25/2020   BILITOT <0.2 11/25/2020   Lab Results  Component Value Date   HGBA1C 6.1 (H) 11/25/2020   HGBA1C 5.9 06/21/2020   HGBA1C 5.8 (H) 04/30/2020   HGBA1C 5.6 10/09/2019   HGBA1C 5.8 (H) 07/05/2019   Lab Results  Component Value Date   INSULIN 6.0 11/25/2020  INSULIN 4.3 10/09/2019   INSULIN 8.6 07/05/2019   INSULIN 6.4 12/05/2018   INSULIN 3.6 04/27/2018   Lab Results  Component Value Date   TSH 2.23 06/21/2020   Lab Results  Component Value Date   CHOL 149 11/25/2020   HDL 62 11/25/2020   LDLCALC 73 11/25/2020   TRIG 73 11/25/2020   CHOLHDL 2.1 07/17/2020   Lab Results  Component Value Date   VD25OH 66.2 11/25/2020   VD25OH 58.1 04/30/2020   VD25OH 42.0 10/09/2019    Lab Results  Component Value Date   WBC 3.4 11/25/2020   HGB 13.7 11/25/2020   HCT 43.2 11/25/2020   MCV 98 (H) 11/25/2020   PLT 209 11/25/2020   Lab Results  Component Value Date   IRON 91 12/07/2019   TIBC 255 12/07/2019   FERRITIN 147 12/07/2019    Obesity Behavioral Intervention:   Approximately 15 minutes were spent on the discussion below.  ASK: We discussed the diagnosis of obesity with Heather [name] today and Heather [name] agreed to give [name] permission to discuss obesity behavioral modification therapy today.  ASSESS: Heather [name] has the diagnosis of obesity and her BMI today is 35.2. Heather [name] is in the action stage of change.   ADVISE: Heather [name] was educated on the multiple health risks of obesity as well as the benefit of weight loss to improve her health. She was advised of the need for long term treatment and the importance of lifestyle modifications to improve her current health and to decrease her risk of future health problems.  AGREE: Multiple dietary modification options and treatment options were discussed and Heather [name] agreed to follow the recommendations documented in the above note.  ARRANGE: Heather [name] was educated on the importance of frequent visits to treat obesity as outlined per CMS and USPSTF guidelines and agreed to schedule her next follow up appointment today.  Attestation Statements:   Reviewed by clinician on day of visit: allergies, medications, problem list, medical history, surgical history, family history, social history, and previous encounter notes.   I, [name], am acting as transcriptionist for [name], MD.  I have reviewed the above documentation for accuracy and completeness, and I agree with the above. -  [name], MD

## 2020-12-25 ENCOUNTER — Other Ambulatory Visit: Payer: Self-pay

## 2020-12-25 ENCOUNTER — Ambulatory Visit: Payer: Self-pay

## 2020-12-25 ENCOUNTER — Ambulatory Visit (INDEPENDENT_AMBULATORY_CARE_PROVIDER_SITE_OTHER): Payer: Medicare Other | Admitting: Orthopaedic Surgery

## 2020-12-25 ENCOUNTER — Encounter: Payer: Self-pay | Admitting: Orthopaedic Surgery

## 2020-12-25 VITALS — Ht 65.0 in | Wt 211.0 lb

## 2020-12-25 DIAGNOSIS — M1712 Unilateral primary osteoarthritis, left knee: Secondary | ICD-10-CM | POA: Diagnosis not present

## 2020-12-25 DIAGNOSIS — M25561 Pain in right knee: Secondary | ICD-10-CM | POA: Diagnosis not present

## 2020-12-25 DIAGNOSIS — M25562 Pain in left knee: Secondary | ICD-10-CM | POA: Diagnosis not present

## 2020-12-25 DIAGNOSIS — M1711 Unilateral primary osteoarthritis, right knee: Secondary | ICD-10-CM | POA: Diagnosis not present

## 2020-12-25 DIAGNOSIS — I2510 Atherosclerotic heart disease of native coronary artery without angina pectoris: Secondary | ICD-10-CM

## 2020-12-25 DIAGNOSIS — G8929 Other chronic pain: Secondary | ICD-10-CM | POA: Diagnosis not present

## 2020-12-25 NOTE — Progress Notes (Signed)
Office Visit Note   Patient: Heather [name]           Date of Birth: 1945-10-29           MRN: [id number] Visit Date: 12/25/2020              Requested by: Lyndal Pulley, DO [place],  [place] [id number] PCP: McLean-Scocuzza, [name], MD   Assessment & Plan: Visit Diagnoses:  1. Chronic pain of left knee   2. Chronic pain of right knee   3. Unilateral primary osteoarthritis, left knee   4. Unilateral primary osteoarthritis, right knee     Plan: Given the severity of her arthritis of both knees we are recommending knee replacement surgery for her.  Her left knee hurts her much worse in the right knee so we will proceed with a right knee first.  I showed her a knee replacement model and explained in detail what the surgery involves.  We talked about the risk and benefits of surgery and what to expect from an intraoperative and postoperative course.  Given her severe arthritis on x-rays combined with a very conservative treatment we do feel this is necessary at this point given the pain she is having and the detrimental effect that her arthritis is having on her mobility, her quality of life and actives daily living.  All questions and concerns were answered and addressed.  We will work on getting this scheduled in the near future for a left total knee arthroplasty.  Follow-Up Instructions: Return for 2 weeks post-op.   Orders:  Orders Placed This Encounter  Procedures   XR Knee 1-2 Views Right   XR Knee 1-2 Views Left   No orders of the defined types were placed in this encounter.     Procedures: No procedures performed   Clinical Data: No additional findings.   Subjective: Chief Complaint  Patient presents with   Right Knee - Pain   Left Knee - Pain  The patient is a very pleasant 75 year old female that I am seeing for the first time as a relates to severe end-stage arthritis of both knees that have been treated by orthopedic sports medicine  appropriately.  She is a patient of Dr. Hulan Saas.  He actually did place steroid injections in both knees earlier this month.  She has been on a significant weight loss journey and her BMI is now down to 35.  She does ambulate with a cane and has severe daily knee pain with both her knees.  She has well-documented bone-on-bone arthritis of both knees we did 1 x-ray release today because has been a long time since she has had her knees x-rayed.  At this point she is interested in knee replacement surgery.  HPI  Review of Systems She currently denies any headache, chest pain, shortness of breath, fever, chills, nausea, vomiting  Objective: Vital Signs: Ht 5' 5"  (1.651 m)   Wt 211 lb (95.7 kg)   BMI 35.11 kg/m   Physical Exam She is alert and orient x3 and in no acute distress.  She walks very slowly with a cane and is hunched over due to pain in both her knees. Ortho Exam Both knees were examined today.  They have global medial and lateral joint line tenderness as well as patellofemoral tenderness and pain with crepitation throughout the arc of motion.  Both knees are ligamentously stable but slightly warm and obvious Specialty Comments:  No specialty comments  available.  Imaging: XR Knee 1-2 Views Left  Result Date: 12/25/2020 2 views of the left knee shows severe end-stage arthritis involving all 3 compartments with joint space narrowing and periarticular osteophytes.  XR Knee 1-2 Views Right  Result Date: 12/25/2020 2 views of the right knee show severe end-stage arthritis with particular osteophytes in all 3 compartments and significant joint space narrowing.    PMFS History: Patient Active Problem List   Diagnosis Date Noted   Unilateral primary osteoarthritis, left knee 12/25/2020   Unilateral primary osteoarthritis, right knee 12/25/2020   Lumbar radiculopathy 07/04/2020   Bilateral sciatica 07/04/2020   Pain in both hands 07/04/2020   Hyperlipidemia associated with  type 2 diabetes mellitus ([place]) 05/01/2020   Abdominal discomfort 03/14/2020   Abnormal blood level of copper 03/14/2020   Leukopenia 03/14/2020   Obesity (BMI 30-39.9) 11/13/2019   Chronic pain of right thumb 10/11/2019   Prediabetes 09/06/2019   Vitamin D deficiency 07/27/2019   Memory loss 04/27/2019   Thyroid nodule 04/27/2019   Mass of left wrist 45/80/9983   Periumbilical hernia 38/25/0539   Mass of right thigh 09/13/2018   Depression with anxiety 08/02/2018   Sleeps in sitting position due to orthopnea 07/13/2018   Supplemental oxygen dependent 07/13/2018   BiPAP (biphasic positive airway pressure) dependence 05/24/2018   Goiter 04/15/2017   Urinary incontinence 04/15/2017   Type 2 diabetes mellitus without complication, without long-term current use of insulin (HCC) 02/15/2017   Bilateral leg cramps 01/05/2017   High serum vitamin B12 01/05/2017   Peripheral neuropathy 05/27/2016   Screen for colon cancer 04/02/2016   Elevated sed rate 03/05/2016   RLS (restless legs syndrome) 02/12/2016   Muscle spasm of both lower legs 02/12/2016   Edema of both ankles 02/12/2016   Iron deficiency anemia 02/12/2016   Hypokalemia 02/12/2016   Pernicious anemia 02/12/2016   Degenerative disc disease, lumbar 04/09/2015   Degenerative arthritis of knee, bilateral 03/13/2015   Chronic low back pain 03/13/2015   Coronary atherosclerosis of native coronary artery 10/31/2013   Incidental lung nodule, > 4m and < 874m Left lower lobe 41m5m June 2014, April 2015 unchanged 06/25/2013   OSA on CPAP 06/21/2013   Paraesophageal hernia 06/20/2013   Myalgia and myositis 05/02/2013   Muscle ache 04/03/2013   Nocturnal hypoxemia 02/17/2013   Class 1 obesity with serious comorbidity and body mass index (BMI) of 34.0 to 34.9 in adult 01/24/2013   Large hiatal hernia 08/24/2012    LLL nodule 6mm53mJune 2014, described in 2003 07/13/2012   Asthma, chronic 06/29/2012   Arthralgia 06/02/2012    Unspecified asthma(493.90) 06/02/2012   Hypersomnia with sleep apnea, unspecified 03/11/2012   Other alteration of consciousness 03/11/2012   Migraine without aura 03/11/2012   Encounter for long-term (current) use of other medications 12/28/2011   SOB (shortness of breath) 12/28/2011   Screening examination for infectious disease 12/28/2011   Hx of fibroids 08/03/2011   Knee pain, left 12/25/2010   NECK PAIN 12/03/2009   EDEMA 12/18/2008   TINNITUS 11/22/2007   HEARING LOSS 11/22/2007   Chronic pain of both knees 11/22/2007   ASYMPTOMATIC POSTMENOPAUSAL STATUS 11/22/2007   GOITER, MULTINODULAR 07/20/2007   Gastroesophageal reflux disease without esophagitis 07/20/2007   NASH (nonalcoholic steatohepatitis) 07/20/2007   Headache 07/20/2007   HYPERCHOLESTEROLEMIA 01/13/2007   ANXIETY 10/22/2006   Essential hypertension 10/22/2006   Allergic rhinitis 10/22/2006   Osteoarthritis 10/22/2006   Past Medical History:  Diagnosis Date   Abdominal hernia  Abnormal Pap smear    ALLERGIC RHINITIS 10/22/2006   Allergy    ANEMIA 12/18/2008   Arthritis    scoliosis moderate deg changes lumbar Xray 11/04/17    ASYMPTOMATIC POSTMENOPAUSAL STATUS 11/22/2007   Back pain    CAD (coronary artery disease)    in LAD   CHF (congestive heart failure) (HCC)    Complication of anesthesia    Degenerative arthritis    DIABETES MELLITUS, TYPE II 01/13/2007   Diverticulosis    Dyslipidemia    Fibroid    Frequent headaches    GERD 07/20/2007   GOITER, MULTINODULAR 07/20/2007   Headache(784.0) 07/20/2007   HEARING LOSS 11/22/2007   Hiatal hernia    Hiatal hernia    large   History of chicken pox    Hx of colposcopy with cervical biopsy    HYPERCHOLESTEROLEMIA 01/13/2007   Hyperglycemia    HYPERTENSION 10/22/2006   Lung nodule    unchanged since 05/26/13    Migraines    NASH (nonalcoholic steatohepatitis)    Nocturnal hypoxemia 02/17/2013   Obesity    Obstructive sleep apnea    OSA on CPAP     per neurology   OSTEOARTHRITIS 10/22/2006   Other chronic nonalcoholic liver disease 10/02/2120   Sedimentation rate elevation    Sleep apnea    on cpap   Urine incontinence    UTI (urinary tract infection)     Family History  Problem Relation Age of Onset   Asthma Mother    Depression Mother    Bipolar disorder Mother    Dementia Mother    Arthritis Mother    Breast cancer Mother        primary   Colon cancer Mother        mets from breast   Cancer Mother        colon   Hypertension Father    Migraines Father    Cancer Maternal Aunt        ?   Heart disease Maternal Grandmother    Cancer Maternal Grandfather        stomach ?    Past Surgical History:  Procedure Laterality Date   BREAST SURGERY  1984   Breast reduction b/l    CATARACT EXTRACTION, BILATERAL     DEXA  08/2005   DILATION AND CURETTAGE OF UTERUS     ELECTROCARDIOGRAM  10/15/2006   ESOPHAGOGASTRODUODENOSCOPY  12/08/2005   FOOT SURGERY     hammertoe and bunion 05/2017    Stress Cardiolite  10/21/2005   sweat gland removal     WISDOM TOOTH EXTRACTION     Social History   Occupational History   Occupation: Retired  Tobacco Use   Smoking status: Former    Packs/day: 2.00    Years: 20.00    Pack years: 40.00    Types: Cigarettes    Quit date: 03/03/1979    Years since quitting: 41.8   Smokeless tobacco: Never  Substance and Sexual Activity   Alcohol use: Yes    Comment: rare   Drug use: No   Sexual activity: Yes    Birth control/protection: Surgical

## 2020-12-30 ENCOUNTER — Encounter (INDEPENDENT_AMBULATORY_CARE_PROVIDER_SITE_OTHER): Payer: Self-pay | Admitting: Family Medicine

## 2021-01-02 ENCOUNTER — Ambulatory Visit (INDEPENDENT_AMBULATORY_CARE_PROVIDER_SITE_OTHER): Payer: Medicare Other | Admitting: Internal Medicine

## 2021-01-02 ENCOUNTER — Other Ambulatory Visit: Payer: Self-pay

## 2021-01-02 ENCOUNTER — Encounter: Payer: Self-pay | Admitting: Internal Medicine

## 2021-01-02 VITALS — BP 144/86 | HR 64 | Temp 98.1°F | Ht 65.0 in | Wt 210.5 lb

## 2021-01-02 DIAGNOSIS — M19049 Primary osteoarthritis, unspecified hand: Secondary | ICD-10-CM

## 2021-01-02 DIAGNOSIS — R7303 Prediabetes: Secondary | ICD-10-CM | POA: Diagnosis not present

## 2021-01-02 DIAGNOSIS — M79641 Pain in right hand: Secondary | ICD-10-CM

## 2021-01-02 DIAGNOSIS — Z23 Encounter for immunization: Secondary | ICD-10-CM

## 2021-01-02 DIAGNOSIS — M25562 Pain in left knee: Secondary | ICD-10-CM

## 2021-01-02 DIAGNOSIS — M25561 Pain in right knee: Secondary | ICD-10-CM | POA: Diagnosis not present

## 2021-01-02 DIAGNOSIS — M5431 Sciatica, right side: Secondary | ICD-10-CM

## 2021-01-02 DIAGNOSIS — M546 Pain in thoracic spine: Secondary | ICD-10-CM

## 2021-01-02 DIAGNOSIS — N3281 Overactive bladder: Secondary | ICD-10-CM

## 2021-01-02 DIAGNOSIS — M5432 Sciatica, left side: Secondary | ICD-10-CM

## 2021-01-02 DIAGNOSIS — M5416 Radiculopathy, lumbar region: Secondary | ICD-10-CM | POA: Diagnosis not present

## 2021-01-02 DIAGNOSIS — E669 Obesity, unspecified: Secondary | ICD-10-CM

## 2021-01-02 DIAGNOSIS — G8929 Other chronic pain: Secondary | ICD-10-CM | POA: Diagnosis not present

## 2021-01-02 DIAGNOSIS — G894 Chronic pain syndrome: Secondary | ICD-10-CM

## 2021-01-02 DIAGNOSIS — M79642 Pain in left hand: Secondary | ICD-10-CM

## 2021-01-02 MED ORDER — TROSPIUM CHLORIDE 20 MG PO TABS: ORAL_TABLET | ORAL | 0 refills | Status: DC

## 2021-01-02 MED ORDER — TRAMADOL HCL 50 MG PO TABS: 50.0000 mg | ORAL_TABLET | Freq: Two times a day (BID) | ORAL | 0 refills | Status: DC | PRN

## 2021-01-02 NOTE — Patient Instructions (Addendum)
Colonoscopy due 05/2021    Phone Fax E-mail Address  [phone] Not available Heather [name] [phone]  Trospium tablets What is this medication? TROSPIUM (TROSE pee um) is used to treat overactive bladder. This medicine reduces the amount of bathroom visits. It may also help to control wetting accidents. This medicine may be used for other purposes; ask your health care provider or pharmacist if you have questions. COMMON BRAND NAME(S): [place] What should I tell my care team before I take this medication? They need to know if you have any of these conditions: difficulty passing urine glaucoma intestinal obstruction or constipation kidney disease liver disease an unusual or allergic reaction to trospium, other medicines, foods, dyes, or preservatives pregnant or trying to get pregnant breast-feeding How should I use this medication? Take this medicine by mouth with a glass of water. Follow the directions on the prescription label. Take this medicine on an empty stomach, at least 1 hour before food. Take your doses at regular intervals. Do not take your medicine more often than directed. Talk to your pediatrician regarding the use of this medicine in children. Special care may be needed. Overdosage: If you think you have taken too much of this medicine contact a poison control center or emergency room at once. NOTE: This medicine is only for you. Do not share this medicine with others. What if I miss a dose? If you miss a dose, take it as soon as you can. If it is almost time for your next dose, take only that dose. Do not take double or extra doses. What may interact with this medication? Do not take this medicine with any of the following medications: dofetilide This medicine may also interact with the following medications: digoxin metformin morphine pancuronium procainamide some medicines for colds, hay fever, or  allergies tenofovir vancomycin This list may not describe all possible interactions. Give your health care provider a list of all the medicines, herbs, non-prescription drugs, or dietary supplements you use. Also tell them if you smoke, drink alcohol, or use illegal drugs. Some items may interact with your medicine. What should I watch for while using this medication? You may need to limit your intake tea, coffee, caffeinated sodas, and alcohol. These drinks may make your symptoms worse. You may get drowsy or dizzy. Do not drive, use machinery, or do anything that needs mental alertness until you know how this medicine affects you. Do not stand or sit up quickly, especially if you are an older patient. This reduces the risk of dizzy or fainting spells. Alcohol may interfere with the effect of this medicine. Avoid alcoholic drinks. Your mouth may get dry. Chewing sugarless gum or sucking hard candy, and drinking plenty of water may help. Contact your doctor if the problem does not go away or is severe. This medicine may cause dry eyes and blurred vision. If you wear contact lenses you may feel some discomfort. Lubricating drops may help. See your eye doctor if the problem does not go away or is severe. Avoid extreme heat. This medicine can cause you to sweat less than normal. Your body temperature could increase to dangerous levels, which may lead to heat stroke. What side effects may I notice from receiving this medication? Side effects that you should report to your doctor or health care professional as soon as possible: allergic reactions like skin rash, itching or hives, swelling of the face, lips, or tongue blurred vision or difficulty focusing vision  breathing problems confusion difficulty passing urine hallucinations severe dizziness Side effects that usually do not require medical attention (report to your doctor or health care professional if they continue or are  bothersome): constipation drowsiness headache indigestion or stomach upset nausea This list may not describe all possible side effects. Call your doctor for medical advice about side effects. You may report side effects to FDA at 1-800-FDA-1088. Where should I keep my medication? Keep out of the reach of children. Store at room temperature between 20 and 25 degrees C (68 and 77 degrees F). Throw away any unused medicine after the expiration date. NOTE: This sheet is a summary. It may not cover all possible information. If you have questions about this medicine, talk to your doctor, pharmacist, or health care provider.  2022 Elsevier/Gold Standard (2010-10-01 14:07:06)  Overactive Bladder, Adult Overactive bladder is a condition in which a person has a sudden and frequent need to urinate. A person might also leak urine if he or she cannot get to the bathroom fast enough (urinary incontinence). Sometimes, symptoms can interfere with work or social activities. What are the causes? Overactive bladder is associated with poor nerve signals between your bladder and your brain. Your bladder may get the signal to empty before it is full. You may also have very sensitive muscles that make your bladder squeeze too soon. This condition may also be caused by other factors, such as: Medical conditions: Urinary tract infection. Infection of nearby tissues. Prostate enlargement. Bladder stones, inflammation, or tumors. Diabetes. Muscle or nerve weakness, especially from these conditions: A spinal cord injury. Stroke. Multiple sclerosis. Parkinson's disease. Other causes: Surgery on the uterus or urethra. Drinking too much caffeine or alcohol. Certain medicines, especially those that eliminate extra fluid in the body (diuretics). Constipation. What increases the risk? You may be at greater risk for overactive bladder if you: Are an older adult. Smoke. Are going through menopause. Have prostate  problems. Have a neurological disease, such as stroke, dementia, Parkinson's disease, or multiple sclerosis (MS). Eat or drink alcohol, spicy food, caffeine, and other things that irritate the bladder. Are overweight or obese. What are the signs or symptoms? Symptoms of this condition include a sudden, strong urge to urinate. Other symptoms include: Leaking urine. Urinating 8 or more times a day. Waking up to urinate 2 or more times overnight. How is this diagnosed? This condition may be diagnosed based on: Your symptoms and medical history. A physical exam. Blood or urine tests to check for possible causes, such as infection. You may also need to see a health care provider who specializes in urinary tract problems. This is called a urologist. How is this treated? Treatment for overactive bladder depends on the cause of your condition and whether it is mild or severe. Treatment may include: Bladder training, such as: Learning to control the urge to urinate by following a schedule to urinate at regular intervals. Doing Kegel exercises to strengthen the pelvic floor muscles that support your bladder. Special devices, such as: Biofeedback. This uses sensors to help you become aware of your body's signals. Electrical stimulation. This uses electrodes placed inside the body (implanted) or outside the body. These electrodes send gentle pulses of electricity to strengthen the nerves or muscles that control the bladder. Women may use a plastic device, called a pessary, that fits into the vagina and supports the bladder. Medicines, such as: Antibiotics to treat bladder infection. Antispasmodics to stop the bladder from releasing urine at the wrong time. Tricyclic  antidepressants to relax bladder muscles. Injections of botulinum toxin type A directly into the bladder tissue to relax bladder muscles. Surgery, such as: A device may be implanted to help manage the nerve signals that control  urination. An electrode may be implanted to stimulate electrical signals in the bladder. A procedure may be done to change the shape of the bladder. This is done only in very severe cases. Follow these instructions at home: Eating and drinking  Make diet or lifestyle changes recommended by your health care provider. These may include: Drinking fluids throughout the day and not only with meals. Cutting down on caffeine or alcohol. Eating a healthy and balanced diet to prevent constipation. This may include: Choosing foods that are high in fiber, such as beans, whole grains, and fresh fruits and vegetables. Limiting foods that are high in fat and processed sugars, such as fried and sweet foods. Lifestyle  Lose weight if needed. Do not use any products that contain nicotine or tobacco. These include cigarettes, chewing tobacco, and vaping devices, such as e-cigarettes. If you need help quitting, ask your health care provider. General instructions Take over-the-counter and prescription medicines only as told by your health care provider. If you were prescribed an antibiotic medicine, take it as told by your health care provider. Do not stop taking the antibiotic even if you start to feel better. Use any implants or pessary as told by your health care provider. If needed, wear pads to absorb urine leakage. Keep a log to track how much and when you drink, and when you need to urinate. This will help your health care provider monitor your condition. Keep all follow-up visits. This is important. Contact a health care provider if: You have a fever or chills. Your symptoms do not get better with treatment. Your pain and discomfort get worse. You have more frequent urges to urinate. Get help right away if: You are not able to control your bladder. Summary Overactive bladder refers to a condition in which a person has a sudden and frequent need to urinate. Several conditions may lead to an  overactive bladder. Treatment for overactive bladder depends on the cause and severity of your condition. Making lifestyle changes, doing Kegel exercises, keeping a log, and taking medicines can help with this condition. This information is not intended to replace advice given to you by your health care provider. Make sure you discuss any questions you have with your health care provider. Document Revised: 11/06/2019 Document Reviewed: 11/06/2019 Elsevier Patient Education  [place].

## 2021-01-02 NOTE — Progress Notes (Signed)
Chief Complaint  Patient presents with   Follow-up    Pt c/o overactive bladder and wants to discuss the upcoming knee surgery due to ongoing pain in legs since last knee injection   F/u with husband  1. Pending b/l knee surgery left then right with Dr. Ninfa Linden she reports chronic pain and legs painful 8/10 and arthritis pain over body tylenol does not help still has 4 tramadol left from when last Rx but wants to be able to get more  -have pt resign pain contract tramadol bid prn #60/month 2. C/o overactive bladder and wants to try medication for this disc prev [facility] but myrbetriq costs too much at times with painful joints has trouble making it to the bathroom and wears adult diapers  3. Obesity f/u Dr. Leafy Ro wt loss clinic and wants to discuss ozempic has appt scheduled she and husband bring this up today  Review of Systems  Constitutional:  Negative for weight loss.  HENT:  Negative for hearing loss.   Eyes:  Negative for blurred vision.  Respiratory:  Negative for shortness of breath.   Cardiovascular:  Negative for chest pain.  Gastrointestinal:  Negative for abdominal pain and blood in stool.  Genitourinary:  Positive for frequency. Negative for dysuria.  Musculoskeletal:  Positive for joint pain. Negative for falls.  Skin:  Negative for rash.  Neurological:  Negative for headaches.  Psychiatric/Behavioral:  Negative for depression.   Past Medical History:  Diagnosis Date   Abdominal hernia    Abnormal Pap smear    ALLERGIC RHINITIS 10/22/2006   Allergy    ANEMIA 12/18/2008   Arthritis    scoliosis moderate deg changes lumbar Xray 11/04/17    ASYMPTOMATIC POSTMENOPAUSAL STATUS 11/22/2007   Back pain    CAD (coronary artery disease)    in LAD   CHF (congestive heart failure) (HCC)    Complication of anesthesia    Degenerative arthritis    DIABETES MELLITUS, TYPE II 01/13/2007   Diverticulosis    Dyslipidemia    Fibroid    Frequent headaches    GERD  07/20/2007   GOITER, MULTINODULAR 07/20/2007   Headache(784.0) 07/20/2007   HEARING LOSS 11/22/2007   Hiatal hernia    Hiatal hernia    large   History of chicken pox    Hx of colposcopy with cervical biopsy    HYPERCHOLESTEROLEMIA 01/13/2007   Hyperglycemia    HYPERTENSION 10/22/2006   Lung nodule    unchanged since 05/26/13    Migraines    NASH (nonalcoholic steatohepatitis)    Nocturnal hypoxemia 02/17/2013   Obesity    Obstructive sleep apnea    OSA on CPAP    per neurology   OSTEOARTHRITIS 10/22/2006   Other chronic nonalcoholic liver disease 6/37/8588   Sedimentation rate elevation    Sleep apnea    on cpap   Urine incontinence    UTI (urinary tract infection)    Past Surgical History:  Procedure Laterality Date   BREAST SURGERY  1984   Breast reduction b/l    CATARACT EXTRACTION, BILATERAL     DEXA  08/2005   DILATION AND CURETTAGE OF UTERUS     ELECTROCARDIOGRAM  10/15/2006   ESOPHAGOGASTRODUODENOSCOPY  12/08/2005   FOOT SURGERY     hammertoe and bunion 05/2017    Stress Cardiolite  10/21/2005   sweat gland removal     WISDOM TOOTH EXTRACTION     Family History  Problem Relation Age of Onset  Asthma Mother    Depression Mother    Bipolar disorder Mother    Dementia Mother    Arthritis Mother    Breast cancer Mother        primary   Colon cancer Mother        mets from breast   Cancer Mother        colon   Hypertension Father    Migraines Father    Cancer Maternal Aunt        ?   Heart disease Maternal Grandmother    Cancer Maternal Grandfather        stomach ?   Social History   Socioeconomic History   Marital status: Married    Spouse name: [name]   Number of children: 2   Years of education: College   Highest education level: Not on file  Occupational History   Occupation: Retired  Tobacco Use   Smoking status: Former    Packs/day: 2.00    Years: 20.00    Pack years: 40.00    Types: Cigarettes    Quit date: 03/03/1979    Years since  quitting: 41.8   Smokeless tobacco: Never  Substance and Sexual Activity   Alcohol use: Yes    Comment: rare   Drug use: No   Sexual activity: Yes    Birth control/protection: Surgical  Other Topics Concern   Not on file  Social History Narrative   Patient is married [name]).   Right Handed   Drinks 1 cup caffeine   Social Determinants of Health   Financial Resource Strain: Low Risk    Difficulty of Paying Living Expenses: Not hard at all  Food Insecurity: No Food Insecurity   Worried About Charity fundraiser in the Last Year: Never true   Arboriculturist in the Last Year: Never true  Transportation Needs: No Transportation Needs   Lack of Transportation (Medical): No   Lack of Transportation (Non-Medical): No  Physical Activity: Not on file  Stress: No Stress Concern Present   Feeling of Stress : Not at all  Social Connections: Unknown   Frequency of Communication with Friends and Family: Not on file   Frequency of Social Gatherings with Friends and Family: Not on file   Attends Religious Services: Not on file   Active Member of Clubs or Organizations: Not on file   Attends Archivist Meetings: Not on file   Marital Status: Married  Human resources officer Violence: Not At Risk   Fear of Current or Ex-Partner: No   Emotionally Abused: No   Physically Abused: No   Sexually Abused: No   Current Meds  Medication Sig   acetaminophen (TYLENOL) 500 MG tablet Take 650 mg by mouth 3 (three) times daily as needed for pain.    Calcium-Vitamin D-Vitamin K (VIACTIV CALCIUM PLUS D PO) Take by mouth.   cetirizine (ZYRTEC) 10 MG tablet Take 10 mg by mouth daily.   clopidogrel (PLAVIX) 75 MG tablet Take 1 tablet (75 mg total) by mouth daily. Needs appt for future refills   COVID-19 mRNA Vac-TriS, Pfizer, (PFIZER-BIONT COVID-19 VAC-TRIS) SUSP injection Inject into the muscle.   Cyanocobalamin 1000 MCG CAPS Take 1 capsule by mouth daily.   diclofenac Sodium (VOLTAREN) 1 % GEL  Apply topically 4 (four) times daily.   ezetimibe (ZETIA) 10 MG tablet Take 1 tablet (10 mg total) by mouth daily.   Ferrous Sulfate (IRON) 325 (65 FE) MG TABS Take 1 tablet by  mouth daily.   furosemide (LASIX) 40 MG tablet TAKE 1 TABLET DAILY AS     NEEDED   gabapentin (NEURONTIN) 100 MG capsule Take 3 pills at bedtime   glucosamine-chondroitin 500-400 MG tablet Take 1 tablet by mouth 2 (two) times daily.   Multiple Vitamin (MULTIVITAMIN) tablet Take 1 tablet by mouth daily.   Omega-3 Fatty Acids (FISH OIL) 1000 MG CAPS Take 1 capsule by mouth daily.   pantoprazole (PROTONIX) 20 MG tablet TAKE 1 TABLET DAILY 30     MINUTES BEFORE FOOD        (DISCONTINUE DEXILANT,     DISCONTINUE 40MG DOSE)   polyethylene glycol (MIRALAX / GLYCOLAX) 17 g packet Take 17 g by mouth daily as needed.   potassium chloride SA (KLOR-CON M20) 20 MEQ tablet Take 1 tablet (20 mEq total) by mouth daily.   pyridOXINE (VITAMIN B-6) 100 MG tablet Take 200 mg by mouth daily.   rosuvastatin (CRESTOR) 40 MG tablet Take 1 tablet (40 mg total) by mouth daily. At night   SF 5000 PLUS 1.1 % CREA dental cream    topiramate (TOPAMAX) 25 MG tablet Take 1 tablet (25 mg total) by mouth at bedtime.   trospium (SANCTURA) 20 MG tablet Daily at night after 1-2 weeks increase to bid   Turmeric 500 MG CAPS Take 1 capsule by mouth daily.   UNABLE TO FIND Instaflex for knees   Vitamin D, Ergocalciferol, (DRISDOL) 1.25 MG (50000 UNIT) CAPS capsule Take 1 capsule (50,000 Units total) by mouth every 7 (seven) days.   vitamin E 200 UNIT capsule Take 200 Units by mouth daily.   [DISCONTINUED] traMADol (ULTRAM) 50 MG tablet Take 1 tablet (50 mg total) by mouth every 12 (twelve) hours as needed.   Allergies  Allergen Reactions   Aspirin    Coconut Oil    Lisinopril     REACTION: cough   Metoprolol Itching   Peanut-Containing Drug Products    Penicillins     REACTION: rash   Prednisone    Strawberry Extract    Recent Results (from the  past 2160 hour(s))  CBC with Differential/Platelet     Status: Abnormal   Collection Time: 11/25/20 11:31 AM  Result Value Ref Range   WBC 3.4 3.4 - 10.8 x10E3/uL   RBC 4.43 3.77 - 5.28 x10E6/uL   Hemoglobin 13.7 11.1 - 15.9 g/dL   Hematocrit 43.2 34.0 - 46.6 %   MCV 98 (H) 79 - 97 fL   MCH 30.9 26.6 - 33.0 pg   MCHC 31.7 31.5 - 35.7 g/dL   RDW 13.0 11.7 - 15.4 %   Platelets 209 150 - 450 x10E3/uL   Neutrophils 50 Not Estab. %   Lymphs 36 Not Estab. %   Monocytes 11 Not Estab. %   Eos 2 Not Estab. %   Basos 1 Not Estab. %   Neutrophils Absolute 1.7 1.4 - 7.0 x10E3/uL   Lymphocytes Absolute 1.2 0.7 - 3.1 x10E3/uL   Monocytes Absolute 0.4 0.1 - 0.9 x10E3/uL   EOS (ABSOLUTE) 0.1 0.0 - 0.4 x10E3/uL   Basophils Absolute 0.0 0.0 - 0.2 x10E3/uL   Immature Granulocytes 0 Not Estab. %   Immature Grans (Abs) 0.0 0.0 - 0.1 x10E3/uL  CMP14+EGFR     Status: None   Collection Time: 11/25/20 11:31 AM  Result Value Ref Range   Glucose 88 70 - 99 mg/dL    Comment:               **  Please note reference interval change**   BUN 15 8 - 27 mg/dL   Creatinine, Ser 0.78 0.57 - 1.00 mg/dL   eGFR 80 >59 mL/min/1.73   BUN/Creatinine Ratio 19 12 - 28   Sodium 141 134 - 144 mmol/L   Potassium 3.9 3.5 - 5.2 mmol/L   Chloride 103 96 - 106 mmol/L   CO2 23 20 - 29 mmol/L   Calcium 9.1 8.7 - 10.3 mg/dL   Total Protein 7.3 6.0 - 8.5 g/dL   Albumin 4.2 3.7 - 4.7 g/dL   Globulin, Total 3.1 1.5 - 4.5 g/dL   Albumin/Globulin Ratio 1.4 1.2 - 2.2   Bilirubin Total <0.2 0.0 - 1.2 mg/dL   Alkaline Phosphatase 60 44 - 121 IU/L   AST 18 0 - 40 IU/L   ALT 13 0 - 32 IU/L  Lipid Panel With LDL/HDL Ratio     Status: None   Collection Time: 11/25/20 11:31 AM  Result Value Ref Range   Cholesterol, Total 149 100 - 199 mg/dL   Triglycerides 73 0 - 149 mg/dL   HDL 62 >39 mg/dL   VLDL Cholesterol Cal 14 5 - 40 mg/dL   LDL Chol Calc (NIH) 73 0 - 99 mg/dL   LDL/HDL Ratio 1.2 0.0 - 3.2 ratio    Comment:                                      LDL/HDL Ratio                                             Men  Women                               1/2 Avg.Risk  1.0    1.5                                   Avg.Risk  3.6    3.2                                2X Avg.Risk  6.2    5.0                                3X Avg.Risk  8.0    6.1   Insulin, random     Status: None   Collection Time: 11/25/20 11:31 AM  Result Value Ref Range   INSULIN 6.0 2.6 - 24.9 uIU/mL  Hemoglobin A1c     Status: Abnormal   Collection Time: 11/25/20 11:31 AM  Result Value Ref Range   Hgb A1c MFr Bld 6.1 (H) 4.8 - 5.6 %    Comment:          Prediabetes: 5.7 - 6.4          Diabetes: >6.4          Glycemic control for adults with diabetes: <7.0    Est. average glucose Bld gHb Est-mCnc 128 mg/dL  Amylase     Status: None   Collection Time: 11/25/20 11:31  AM  Result Value Ref Range   Amylase 53 31 - 110 U/L  Lipase     Status: None   Collection Time: 11/25/20 11:31 AM  Result Value Ref Range   Lipase 16 14 - 85 U/L  VITAMIN D 25 Hydroxy (Vit-D Deficiency, Fractures)     Status: None   Collection Time: 11/25/20 11:31 AM  Result Value Ref Range   Vit D, 25-Hydroxy 66.2 30.0 - 100.0 ng/mL    Comment: Vitamin D deficiency has been defined by the [place] practice guideline as a level of serum 25-OH vitamin D less than 20 ng/mL (1,2). The Endocrine Society went on to further define vitamin D insufficiency as a level between 21 and 29 ng/mL (2). 1. IOM (Institute of Medicine). 2010. Dietary reference    intakes for calcium and D. [place]: The    [employer]. 2. Holick MF, Binkley [place], Bischoff-Ferrari HA, et al.    Evaluation, treatment, and prevention of vitamin D    deficiency: an Endocrine Society clinical practice    guideline. JCEM. 2011 Jul; 96(7):1911-30.    Objective  Body mass index is 35.03 kg/m. Wt Readings from Last 3 Encounters:  01/02/21 210 lb 8 oz (95.5 kg)   12/25/20 211 lb (95.7 kg)  12/17/20 211 lb (95.7 kg)   Temp Readings from Last 3 Encounters:  01/02/21 98.1 F (36.7 C)  12/17/20 98.1 F (36.7 C)  11/25/20 98.2 F (36.8 C)   BP Readings from Last 3 Encounters:  01/02/21 (!) 144/86  12/17/20 127/75  12/10/20 118/62   Pulse Readings from Last 3 Encounters:  01/02/21 64  12/17/20 61  12/10/20 63    Physical Exam Vitals and nursing note reviewed.  Constitutional:      Appearance: Normal appearance. She is well-developed and well-groomed. She is obese.  HENT:     Head: Normocephalic and atraumatic.  Eyes:     Conjunctiva/sclera: Conjunctivae normal.     Pupils: Pupils are equal, round, and reactive to light.  Cardiovascular:     Rate and Rhythm: Normal rate and regular rhythm.     Heart sounds: Normal heart sounds. No murmur heard. Pulmonary:     Effort: Pulmonary effort is normal.     Breath sounds: Normal breath sounds.  Abdominal:     General: Abdomen is flat. Bowel sounds are normal.     Tenderness: There is no abdominal tenderness.  Musculoskeletal:        General: No tenderness.  Skin:    General: Skin is warm and dry.  Neurological:     General: No focal deficit present.     Mental Status: She is alert and oriented to person, place, and time. Mental status is at baseline.     Cranial Nerves: Cranial nerves 2-12 are intact.     Gait: Gait abnormal.     Comments: B/l walks with cane/walker slowed gait   Psychiatric:        Attention and Perception: Attention and perception normal.        Mood and Affect: Mood and affect normal.        Speech: Speech normal.        Behavior: Behavior normal. Behavior is cooperative.        Thought Content: Thought content normal.        Cognition and Memory: Cognition and memory normal.        Judgment: Judgment normal.    Assessment  Plan  Overactive bladder - Plan: trospium (SANCTURA) 20 MG tablet qhs increase to bid if needed after 1-2 weeks  Myrbetriq too costly    Chronic pain of both knees, low back, thumb - Plan: traMADol (ULTRAM) 50 MG tablet bid prn #60 new pain contract signed today Dr. Ninfa Linden will do left then right knee surgery soon has to wait until 03/2021 when his sch available   Prediabetes/obesity  F/u wt loss clinic will disc with Dr. Waynette Buttery pt is interested   [facility] Fasting labs 11/25/2020  Flu shot high dose utd given today prevnar had 01/30/14 pna 23 given 01/18/2019  zostavax had 01/30/14 -consider shingrix  given Rx for this prev11/18/20  Tdap 01/30/14  covid 19 4/4 consider 5th dose    DEXA 03/06/15 normal    mammo copy get from Dr. [facility]  -per pt she will f/u with Dr. Mancel Bale who ordered her mammogram  -mammogram 07/03/19 normal  scheduled 2022 per pt 07/22/20 ROI today for mammogram as of 01/02/21 no copy but pt had this done   Pap out of age window   Colonoscopy 06/18/16 severe diverticulosis polyp x 1 tubular adenoma FH mom colon cancer f/u in 5 years   HCV neg 12/28/11    [place] [facility] exam due as of 03/12/20 , scheduled as of 07/02/20    Former smoker quit in 63s smoker x 20 years+ 2 ppd. CT 01/14/18 mild COPD (off Advair), stable goiter, mild CAD, stable left lung nodule lower lobe likely benign    CT chest 12/2017 benign lung nodules noted    Other providers [facility] Dr. Leafy Ro, congratulated  Sports medicine Dr. Tamala Julian Cardiologist Dr. [name]  [place] [facility] as of 03/12/20 needs to sch    Provider: Dr. Olivia Mackie McLean-[facility]ocuz[facility]

## 2021-01-03 ENCOUNTER — Encounter: Payer: Self-pay | Admitting: Internal Medicine

## 2021-01-06 ENCOUNTER — Other Ambulatory Visit: Payer: Self-pay | Admitting: Internal Medicine

## 2021-01-06 DIAGNOSIS — G8929 Other chronic pain: Secondary | ICD-10-CM | POA: Insufficient documentation

## 2021-01-06 DIAGNOSIS — M25561 Pain in right knee: Secondary | ICD-10-CM

## 2021-01-06 DIAGNOSIS — M5416 Radiculopathy, lumbar region: Secondary | ICD-10-CM

## 2021-01-06 MED ORDER — TRAMADOL HCL 50 MG PO TABS: 50.0000 mg | ORAL_TABLET | Freq: Two times a day (BID) | ORAL | 0 refills | Status: DC | PRN

## 2021-01-08 ENCOUNTER — Telehealth: Payer: Self-pay | Admitting: Internal Medicine

## 2021-01-08 ENCOUNTER — Ambulatory Visit (INDEPENDENT_AMBULATORY_CARE_PROVIDER_SITE_OTHER): Payer: Medicare Other | Admitting: Family Medicine

## 2021-01-08 NOTE — Telephone Encounter (Signed)
Marena from [facility] contacted [name] in regards to pts referral for physical therapy. Pt is unsure what the physical therapy is for. Myrtha Mantis needs an updated referral form with diagnosis code for pt. Pt is scheduled for an appt on 11/17. If at all possible could it be faxed over before her appt on 11/17  Fax number is [phone]

## 2021-01-09 ENCOUNTER — Other Ambulatory Visit: Payer: Self-pay

## 2021-01-09 ENCOUNTER — Ambulatory Visit (INDEPENDENT_AMBULATORY_CARE_PROVIDER_SITE_OTHER): Payer: Medicare Other | Admitting: Podiatry

## 2021-01-09 ENCOUNTER — Encounter: Payer: Self-pay | Admitting: Podiatry

## 2021-01-09 DIAGNOSIS — D689 Coagulation defect, unspecified: Secondary | ICD-10-CM

## 2021-01-09 DIAGNOSIS — M79676 Pain in unspecified toe(s): Secondary | ICD-10-CM

## 2021-01-09 DIAGNOSIS — D2372 Other benign neoplasm of skin of left lower limb, including hip: Secondary | ICD-10-CM

## 2021-01-09 DIAGNOSIS — D2371 Other benign neoplasm of skin of right lower limb, including hip: Secondary | ICD-10-CM

## 2021-01-09 DIAGNOSIS — Z23 Encounter for immunization: Secondary | ICD-10-CM | POA: Diagnosis not present

## 2021-01-09 DIAGNOSIS — B351 Tinea unguium: Secondary | ICD-10-CM | POA: Diagnosis not present

## 2021-01-09 NOTE — Progress Notes (Signed)
She presents today chief complaint of painful elongated toenails 1 through 5 bilaterally.  She denies any other complications she does states she is about to have left knee surgery by Dr. Ninfa Linden.  Objective: Vital signs stable alert oriented x3.  There is no erythema edema cellulitis drainage or odor toenails are long thick yellow dystrophic-like mycotic pulses remain palpable.  Assessment pain in limb secondary to onychomycosis.  Plan: Debridement of toenails 1 through 5 bilateral.

## 2021-01-09 NOTE — Telephone Encounter (Signed)
Please advise on what DX code to use for referral?

## 2021-01-11 ENCOUNTER — Encounter: Payer: Self-pay | Admitting: Adult Health

## 2021-01-13 ENCOUNTER — Other Ambulatory Visit: Payer: Self-pay | Admitting: Internal Medicine

## 2021-01-13 ENCOUNTER — Other Ambulatory Visit (HOSPITAL_COMMUNITY): Payer: Self-pay | Admitting: Cardiology

## 2021-01-13 ENCOUNTER — Encounter: Payer: Self-pay | Admitting: Internal Medicine

## 2021-01-13 NOTE — Telephone Encounter (Signed)
[name]  Dx codes attached to referral through the check boxes see below pt has appt 01/16/21   Chronic low back pain radiculopathy  B/l knee pain pending knee surgery 03/2021 ortho I think Dr. Ninfa Linden  Hand pain arthritis  M25.56, M54.6 M25.562 G89.29 M54.31 M54.32 M79.641 G41.590

## 2021-01-13 NOTE — Progress Notes (Signed)
PATIENT: Heather [name] DOB: [birth year]  REASON FOR VISIT: follow up HISTORY FROM: patient PRIMARY NEUROLOGIST:   HISTORY OF PRESENT ILLNESS: Today 01/14/21:  Heather [name] is a 75 year old female with a history of obstructive sleep apnea on CPAP, memory disturbance and migraine headaches.  She returns today for follow-up.  She reports that CPAP continues to work well for her.  She does state that she has a hard time using it the entire night due to pain in the hips and the knees.  Sometimes she has to take it off in order to get comfortable in her bed.  In regards to her memory she feels that it is stable.  She does live at home with her husband.  She is able to complete all ADLs independently.  She manages her own medications, appointments and finances.  She does state at the last visit she told her office she was taking Topamax 25 mg at bedtime.  She states that when she got home she realized that she was actually taking 75 mg.  She states that she had enough tablets that she was able to continue taking 75 mg until this visit.  She states that when she did reduce to 25 mg her headache started to return.    HISTORY   REVIEW OF SYSTEMS: Out of a complete 14 system review of symptoms, the patient complains only of the following symptoms, and all other reviewed systems are negative.   ESS 18  ALLERGIES: Allergies  Allergen Reactions   Aspirin    Coconut Oil    Lisinopril     REACTION: cough   Metoprolol Itching   Peanut-Containing Drug Products    Penicillins     REACTION: rash   Prednisone    Strawberry Extract     HOME MEDICATIONS: Outpatient Medications Prior to Visit  Medication Sig Dispense Refill   acetaminophen (TYLENOL) 500 MG tablet Take 650 mg by mouth 3 (three) times daily as needed for pain.      Calcium-Vitamin D-Vitamin K (VIACTIV CALCIUM PLUS D PO) Take by mouth.     cetirizine (ZYRTEC) 10 MG tablet Take 10 mg by mouth daily.     clopidogrel (PLAVIX) 75  MG tablet Take 1 tablet (75 mg total) by mouth daily. Needs appt for future refills 90 tablet 3   COVID-19 mRNA Vac-TriS, Pfizer, (PFIZER-BIONT COVID-19 VAC-TRIS) SUSP injection Inject into the muscle. 0.3 mL 0   Cyanocobalamin 1000 MCG CAPS Take 1 capsule by mouth daily.     diclofenac Sodium (VOLTAREN) 1 % GEL Apply topically 4 (four) times daily.     ezetimibe (ZETIA) 10 MG tablet Take 1 tablet (10 mg total) by mouth daily. 90 tablet 3   Ferrous Sulfate (IRON) 325 (65 FE) MG TABS Take 1 tablet by mouth daily.     furosemide (LASIX) 40 MG tablet TAKE 1 TABLET DAILY AS     NEEDED**SOLCO 90 tablet 1   gabapentin (NEURONTIN) 100 MG capsule Take 3 pills at bedtime 270 capsule 3   glucosamine-chondroitin 500-400 MG tablet Take 1 tablet by mouth 2 (two) times daily.     Multiple Vitamin (MULTIVITAMIN) tablet Take 1 tablet by mouth daily.     Omega-3 Fatty Acids (FISH OIL) 1000 MG CAPS Take 1 capsule by mouth daily.     pantoprazole (PROTONIX) 20 MG tablet TAKE 1 TABLET DAILY 30     MINUTES BEFORE FOOD        (DISCONTINUE DEXILANT,  DISCONTINUE 40MG DOSE) 90 tablet 3   polyethylene glycol (MIRALAX / GLYCOLAX) 17 g packet Take 17 g by mouth daily as needed.     potassium chloride SA (KLOR-CON M20) 20 MEQ tablet Take 1 tablet (20 mEq total) by mouth daily. 90 tablet 3   pyridOXINE (VITAMIN B-6) 100 MG tablet Take 200 mg by mouth daily.     rosuvastatin (CRESTOR) 40 MG tablet Take 1 tablet (40 mg total) by mouth daily. At night 90 tablet 3   SF 5000 PLUS 1.1 % CREA dental cream   0   topiramate (TOPAMAX) 25 MG tablet Take 1 tablet (25 mg total) by mouth at bedtime. (Patient taking differently: Take 25 mg by mouth at bedtime. Pt is taking 75 mg at bedtime) 90 tablet 3   traMADol (ULTRAM) 50 MG tablet Take 1 tablet (50 mg total) by mouth every 12 (twelve) hours as needed. 60 tablet 0   trospium (SANCTURA) 20 MG tablet Daily at night after 1-2 weeks increase to bid 60 tablet 0   Turmeric 500 MG CAPS Take  1 capsule by mouth daily.     UNABLE TO FIND Instaflex for knees     Vitamin D, Ergocalciferol, (DRISDOL) 1.25 MG (50000 UNIT) CAPS capsule Take 1 capsule (50,000 Units total) by mouth every 7 (seven) days. 4 capsule 0   vitamin E 200 UNIT capsule Take 200 Units by mouth daily.     No facility-administered medications prior to visit.    PAST MEDICAL HISTORY: Past Medical History:  Diagnosis Date   Abdominal hernia    Abnormal Pap smear    ALLERGIC RHINITIS 10/22/2006   Allergy    ANEMIA 12/18/2008   Arthritis    scoliosis moderate deg changes lumbar Xray 11/04/17    ASYMPTOMATIC POSTMENOPAUSAL STATUS 11/22/2007   Back pain    CAD (coronary artery disease)    in LAD   CHF (congestive heart failure) (HCC)    Complication of anesthesia    Degenerative arthritis    DIABETES MELLITUS, TYPE II 01/13/2007   Diverticulosis    Dyslipidemia    Fibroid    Frequent headaches    GERD 07/20/2007   GOITER, MULTINODULAR 07/20/2007   Headache(784.0) 07/20/2007   HEARING LOSS 11/22/2007   Hiatal hernia    Hiatal hernia    large   History of chicken pox    Hx of colposcopy with cervical biopsy    HYPERCHOLESTEROLEMIA 01/13/2007   Hyperglycemia    HYPERTENSION 10/22/2006   Lung nodule    unchanged since 05/26/13    Migraines    NASH (nonalcoholic steatohepatitis)    Nocturnal hypoxemia 02/17/2013   Obesity    Obstructive sleep apnea    OSA on CPAP    per neurology   OSTEOARTHRITIS 10/22/2006   Other chronic nonalcoholic liver disease 5/00/3704   Sedimentation rate elevation    Sleep apnea    on cpap   Urine incontinence    UTI (urinary tract infection)     PAST SURGICAL HISTORY: Past Surgical History:  Procedure Laterality Date   BREAST SURGERY  1984   Breast reduction b/l    CATARACT EXTRACTION, BILATERAL     DEXA  08/2005   DILATION AND CURETTAGE OF UTERUS     ELECTROCARDIOGRAM  10/15/2006   ESOPHAGOGASTRODUODENOSCOPY  12/08/2005   FOOT SURGERY     hammertoe and bunion 05/2017     Stress Cardiolite  10/21/2005   sweat gland removal     WISDOM TOOTH EXTRACTION  FAMILY HISTORY: Family History  Problem Relation Age of Onset   Asthma Mother    Depression Mother    Bipolar disorder Mother    Dementia Mother    Arthritis Mother    Breast cancer Mother        primary   Colon cancer Mother        mets from breast   Cancer Mother        colon   Hypertension Father    Migraines Father    Cancer Maternal Aunt        ?   Heart disease Maternal Grandmother    Cancer Maternal Grandfather        stomach ?   Sleep apnea Neg Hx     SOCIAL HISTORY: Social History   Socioeconomic History   Marital status: Married    Spouse name: [name]   Number of children: 2   Years of education: College   Highest education level: Not on file  Occupational History   Occupation: Retired  Tobacco Use   Smoking status: Former    Packs/day: 2.00    Years: 20.00    Pack years: 40.00    Types: Cigarettes    Quit date: 03/03/1979    Years since quitting: 41.8   Smokeless tobacco: Never  Substance and Sexual Activity   Alcohol use: Yes    Comment: rare - TWICE A YR   Drug use: No   Sexual activity: Yes    Birth control/protection: Surgical  Other Topics Concern   Not on file  Social History Narrative   Patient is married [name]).   Right Handed   Drinks 1 cup caffeine   Social Determinants of Health   Financial Resource Strain: Low Risk    Difficulty of Paying Living Expenses: Not hard at all  Food Insecurity: No Food Insecurity   Worried About Charity fundraiser in the Last Year: Never true   Arboriculturist in the Last Year: Never true  Transportation Needs: No Transportation Needs   Lack of Transportation (Medical): No   Lack of Transportation (Non-Medical): No  Physical Activity: Not on file  Stress: No Stress Concern Present   Feeling of Stress : Not at all  Social Connections: Unknown   Frequency of Communication with Friends and Family: Not on  file   Frequency of Social Gatherings with Friends and Family: Not on file   Attends Religious Services: Not on file   Active Member of Clubs or Organizations: Not on file   Attends Archivist Meetings: Not on file   Marital Status: Married  Human resources officer Violence: Not At Risk   Fear of Current or Ex-Partner: No   Emotionally Abused: No   Physically Abused: No   Sexually Abused: No      PHYSICAL EXAM  Vitals:   01/14/21 1135  BP: 131/73  Pulse: 63  Weight: 208 lb (94.3 kg)  Height: 5' 5"  (1.651 m)   Body mass index is 34.61 kg/m.  MMSE - Mini Mental State Exam 01/14/2021 08/12/2020 02/08/2020  Orientation to time 4 5 5   Orientation to Place 5 5 5   Registration 3 3 3   Attention/ Calculation 4 5 5   Recall 3 3 3   Language- name 2 objects 2 2 2   Language- repeat 1 1 1   Language- follow 3 step command 3 3 3   Language- read & follow direction 1 1 1   Write a sentence 1 1 1   Copy design  1 1 1   Total score 28 30 30      Generalized: Well developed, in no acute distress  Chest: Lungs clear to auscultation bilaterally  Neurological examination  Mentation: Alert oriented to time, place, history taking. Follows all commands speech and language fluent Cranial nerve II-XII: Extraocular movements were full, visual field were full on confrontational test Head turning and shoulder shrug  were normal and symmetric. Motor: The motor testing reveals 5 over 5 strength of all 4 extremities. Good symmetric motor tone is noted throughout.  Sensory: Sensory testing is intact to soft touch on all 4 extremities. No evidence of extinction is noted.  Gait and station: Uses a cane when ambulating   DIAGNOSTIC DATA (LABS, IMAGING, TESTING) - I reviewed patient records, labs, notes, testing and imaging myself where available.  Lab Results  Component Value Date   WBC 3.4 11/25/2020   HGB 13.7 11/25/2020   HCT 43.2 11/25/2020   MCV 98 (H) 11/25/2020   PLT 209 11/25/2020       Component Value Date/Time   NA 141 11/25/2020 1131   K 3.9 11/25/2020 1131   CL 103 11/25/2020 1131   CO2 23 11/25/2020 1131   GLUCOSE 88 11/25/2020 1131   GLUCOSE 81 06/21/2020 0808   BUN 15 11/25/2020 1131   CREATININE 0.78 11/25/2020 1131   CREATININE 0.80 12/07/2019 1246   CREATININE 0.81 12/28/2011 0853   CALCIUM 9.1 11/25/2020 1131   PROT 7.3 11/25/2020 1131   ALBUMIN 4.2 11/25/2020 1131   AST 18 11/25/2020 1131   AST 19 12/07/2019 1246   ALT 13 11/25/2020 1131   ALT 20 12/07/2019 1246   ALKPHOS 60 11/25/2020 1131   BILITOT <0.2 11/25/2020 1131   BILITOT 0.3 12/07/2019 1246   GFRNONAA >60 12/07/2019 1246   GFRAA 100 10/09/2019 1425   Lab Results  Component Value Date   CHOL 149 11/25/2020   HDL 62 11/25/2020   LDLCALC 73 11/25/2020   TRIG 73 11/25/2020   CHOLHDL 2.1 07/17/2020   Lab Results  Component Value Date   HGBA1C 6.1 (H) 11/25/2020   Lab Results  Component Value Date   VITAMINB12 3,179 (H) 12/07/2019   Lab Results  Component Value Date   TSH 2.23 06/21/2020      ASSESSMENT AND PLAN 75 y.o. year old female  has a past medical history of Abdominal hernia, Abnormal Pap smear, ALLERGIC RHINITIS (10/22/2006), Allergy, ANEMIA (12/18/2008), Arthritis, ASYMPTOMATIC POSTMENOPAUSAL STATUS (11/22/2007), Back pain, CAD (coronary artery disease), CHF (congestive heart failure) ([facility]), Complication of anesthesia, Degenerative arthritis, DIABETES MELLITUS, TYPE II (01/13/2007), Diverticulosis, Dyslipidemia, Fibroid, Frequent headaches, GERD (07/20/2007), GOITER, MULTINODULAR (07/20/2007), Headache(784.0) (07/20/2007), HEARING LOSS (11/22/2007), Hiatal hernia, Hiatal hernia, History of chicken pox, colposcopy with cervical biopsy, HYPERCHOLESTEROLEMIA (01/13/2007), Hyperglycemia, HYPERTENSION (10/22/2006), Lung nodule, Migraines, NASH (nonalcoholic steatohepatitis), Nocturnal hypoxemia (02/17/2013), Obesity, Obstructive sleep apnea, OSA on CPAP, OSTEOARTHRITIS (10/22/2006),  Other chronic nonalcoholic liver disease (0/98/1191), Sedimentation rate elevation, Sleep apnea, Urine incontinence, and UTI (urinary tract infection). here with:  OSA on CPAP  - CPAP compliance good - Good treatment of AHI  - Encourage patient to use CPAP nightly and > 4 hours each night  2.  Memory disturbance  -Discussed referral to neuropsychology for memory evaluation however she deferred -MMSE 28/30  3.  Migraine headaches  -Continue Topamax 75 mg at bedtime  - F/U in 6 months or sooner if needed  Ward Givens, MSN, NP-C 01/14/2021, 11:47 AM [facility] [address], [place], [place]  [address] [phone]   

## 2021-01-14 ENCOUNTER — Encounter: Payer: Self-pay | Admitting: Adult Health

## 2021-01-14 ENCOUNTER — Ambulatory Visit (INDEPENDENT_AMBULATORY_CARE_PROVIDER_SITE_OTHER): Payer: Medicare Other | Admitting: Adult Health

## 2021-01-14 VITALS — BP 131/73 | HR 63 | Ht 65.0 in | Wt 208.0 lb

## 2021-01-14 DIAGNOSIS — Z9989 Dependence on other enabling machines and devices: Secondary | ICD-10-CM | POA: Diagnosis not present

## 2021-01-14 DIAGNOSIS — G4733 Obstructive sleep apnea (adult) (pediatric): Secondary | ICD-10-CM

## 2021-01-14 DIAGNOSIS — R413 Other amnesia: Secondary | ICD-10-CM | POA: Diagnosis not present

## 2021-01-14 DIAGNOSIS — G43009 Migraine without aura, not intractable, without status migrainosus: Secondary | ICD-10-CM | POA: Diagnosis not present

## 2021-01-14 DIAGNOSIS — I2510 Atherosclerotic heart disease of native coronary artery without angina pectoris: Secondary | ICD-10-CM

## 2021-01-14 MED ORDER — TOPIRAMATE 25 MG PO TABS: 75.0000 mg | ORAL_TABLET | Freq: Every day | ORAL | 3 refills | Status: DC

## 2021-01-14 NOTE — Telephone Encounter (Signed)
Please advise, is there any form I need to print and fax? DO I need to place an update referral in epic?

## 2021-01-14 NOTE — Patient Instructions (Signed)
Continue using CPAP nightly and greater than 4 hours each night Continue to monitor memory  If your symptoms worsen or you develop new symptoms please let us know.

## 2021-01-15 ENCOUNTER — Encounter (INDEPENDENT_AMBULATORY_CARE_PROVIDER_SITE_OTHER): Payer: Self-pay | Admitting: Family Medicine

## 2021-01-15 ENCOUNTER — Ambulatory Visit (INDEPENDENT_AMBULATORY_CARE_PROVIDER_SITE_OTHER): Payer: Medicare Other | Admitting: Family Medicine

## 2021-01-15 ENCOUNTER — Other Ambulatory Visit: Payer: Self-pay

## 2021-01-15 VITALS — BP 115/68 | HR 99 | Temp 98.1°F | Ht 65.0 in | Wt 205.0 lb

## 2021-01-15 DIAGNOSIS — M170 Bilateral primary osteoarthritis of knee: Secondary | ICD-10-CM

## 2021-01-15 DIAGNOSIS — Z6834 Body mass index (BMI) 34.0-34.9, adult: Secondary | ICD-10-CM

## 2021-01-15 DIAGNOSIS — E559 Vitamin D deficiency, unspecified: Secondary | ICD-10-CM

## 2021-01-15 DIAGNOSIS — E669 Obesity, unspecified: Secondary | ICD-10-CM | POA: Diagnosis not present

## 2021-01-15 MED ORDER — VITAMIN D (ERGOCALCIFEROL) 1.25 MG (50000 UNIT) PO CAPS: 50000.0000 [IU] | ORAL_CAPSULE | ORAL | 0 refills | Status: DC

## 2021-01-15 NOTE — Telephone Encounter (Signed)
New order placed inside last visit

## 2021-01-15 NOTE — Addendum Note (Signed)
Addended by: Thressa Sheller on: 01/15/2021 11:45 AM   Modules accepted: Orders

## 2021-01-15 NOTE — Addendum Note (Signed)
Addended by: Thressa Sheller on: 01/15/2021 07:47 AM   Modules accepted: Orders

## 2021-01-16 ENCOUNTER — Encounter: Payer: Self-pay | Admitting: Internal Medicine

## 2021-01-16 DIAGNOSIS — R262 Difficulty in walking, not elsewhere classified: Secondary | ICD-10-CM | POA: Diagnosis not present

## 2021-01-16 DIAGNOSIS — M25562 Pain in left knee: Secondary | ICD-10-CM | POA: Diagnosis not present

## 2021-01-16 DIAGNOSIS — M25662 Stiffness of left knee, not elsewhere classified: Secondary | ICD-10-CM | POA: Diagnosis not present

## 2021-01-16 DIAGNOSIS — M6281 Muscle weakness (generalized): Secondary | ICD-10-CM | POA: Diagnosis not present

## 2021-01-16 DIAGNOSIS — M25661 Stiffness of right knee, not elsewhere classified: Secondary | ICD-10-CM | POA: Diagnosis not present

## 2021-01-16 DIAGNOSIS — M25561 Pain in right knee: Secondary | ICD-10-CM | POA: Diagnosis not present

## 2021-01-16 NOTE — Progress Notes (Signed)
Chief Complaint:   OBESITY Heather [name] is here to discuss her progress with her obesity treatment plan along with follow-up of her obesity related diagnoses. [name] is on the Category 2 Plan and keeping a food journal and adhering to recommended goals of 1200 calories and 75+ grams of protein daily and states she is following her eating plan approximately 95% of the time. [name] states she is doing 0 minutes 0 times per week.  Today's visit was #: 37 Starting weight: 268 lbs Starting date: 12/23/2016 Today's weight: 205 lbs Today's date: 01/15/2021 Total lbs lost to date: 84 Total lbs lost since last in-office visit: 6  Interim History: [name] continues to do well with weight loss. She is dealing with pain issues, and she is starting physical therapy. She continues to work on her eating plan, and she has been especially diligent with meeting her protein goals.  Subjective:   1. Vitamin D deficiency Heather [name]'s Vit D level is at goal, and she denies nausea, vomiting, or muscle weakness.  2. Osteoarthritis of both knees, unspecified osteoarthritis type Heather [name] will be starting physical therapy tomorrow. She has limited her exercise due to pain and she has a difficult time getting out of the bed in the morning.  Assessment/Plan:   1. Vitamin D deficiency Low Vitamin D level contributes to fatigue and are associated with obesity, breast, and colon cancer. We will refill prescription Vitamin D for 1 month. [name] will follow-up for routine testing of Vitamin D, at least 2-3 times per year to avoid over-replacement.  - Vitamin D, Ergocalciferol, (DRISDOL) 1.25 MG (50000 UNIT) CAPS capsule; Take 1 capsule (50,000 Units total) by mouth every 7 (seven) days.  Dispense: 4 capsule; Refill: 0  2. Osteoarthritis of both knees, unspecified osteoarthritis type Heather [name] will follow physical therapy's recommendations for exercise, and we will reassess her ability to exercise after her knee replacement  surgeries.  3. Obesity with current BMI 34.1 Heather [name] is currently in the action stage of change. As such, her goal is to continue with weight loss efforts. She has agreed to keeping a food journal and adhering to recommended goals of 1200 calories and 75+ grams of protein daily.   Behavioral modification strategies: increasing lean protein intake and holiday eating strategies .  Heather [name] has agreed to follow-up with our clinic in 4 weeks. She was informed of the importance of frequent follow-up visits to maximize her success with intensive lifestyle modifications for her multiple health conditions.   Objective:   Blood pressure 115/68, pulse 99, temperature 98.1 F (36.7 C), height 5' 5"  (1.651 m), weight 205 lb (93 kg), SpO2 99 %. Body mass index is 34.11 kg/m.  General: Cooperative, alert, well developed, in no acute distress. HEENT: Conjunctivae and lids unremarkable. Cardiovascular: Regular rhythm.  Lungs: Normal work of breathing. Neurologic: No focal deficits.   Lab Results  Component Value Date   CREATININE 0.78 11/25/2020   BUN 15 11/25/2020   NA 141 11/25/2020   K 3.9 11/25/2020   CL 103 11/25/2020   CO2 23 11/25/2020   Lab Results  Component Value Date   ALT 13 11/25/2020   AST 18 11/25/2020   ALKPHOS 60 11/25/2020   BILITOT <0.2 11/25/2020   Lab Results  Component Value Date   HGBA1C 6.1 (H) 11/25/2020   HGBA1C 5.9 06/21/2020   HGBA1C 5.8 (H) 04/30/2020   HGBA1C 5.6 10/09/2019   HGBA1C 5.8 (H) 07/05/2019   Lab Results  Component Value Date   INSULIN  6.0 11/25/2020   INSULIN 4.3 10/09/2019   INSULIN 8.6 07/05/2019   INSULIN 6.4 12/05/2018   INSULIN 3.6 04/27/2018   Lab Results  Component Value Date   TSH 2.23 06/21/2020   Lab Results  Component Value Date   CHOL 149 11/25/2020   HDL 62 11/25/2020   LDLCALC 73 11/25/2020   TRIG 73 11/25/2020   CHOLHDL 2.1 07/17/2020   Lab Results  Component Value Date   VD25OH 66.2 11/25/2020   VD25OH 58.1  04/30/2020   VD25OH 42.0 10/09/2019   Lab Results  Component Value Date   WBC 3.4 11/25/2020   HGB 13.7 11/25/2020   HCT 43.2 11/25/2020   MCV 98 (H) 11/25/2020   PLT 209 11/25/2020   Lab Results  Component Value Date   IRON 91 12/07/2019   TIBC 255 12/07/2019   FERRITIN 147 12/07/2019    Obesity Behavioral Intervention:   Approximately 15 minutes were spent on the discussion below.  ASK: We discussed the diagnosis of obesity with Christi today and Bryson agreed to give [name] permission to discuss obesity behavioral modification therapy today.  ASSESS: Heather [name] has the diagnosis of obesity and her BMI today is 34.1. Heather [name] is in the action stage of change.   ADVISE: Heather [name] was educated on the multiple health risks of obesity as well as the benefit of weight loss to improve her health. She was advised of the need for long term treatment and the importance of lifestyle modifications to improve her current health and to decrease her risk of future health problems.  AGREE: Multiple dietary modification options and treatment options were discussed and Heather [name] agreed to follow the recommendations documented in the above note.  ARRANGE: Heather [name] was educated on the importance of frequent visits to treat obesity as outlined per CMS and USPSTF guidelines and agreed to schedule her next follow up appointment today.  Attestation Statements:   Reviewed by clinician on day of visit: allergies, medications, problem list, medical history, surgical history, family history, social history, and previous encounter notes.   I, [name], am acting as transcriptionist for [name], MD.  I have reviewed the above documentation for accuracy and completeness, and I agree with the above. -  [name], MD

## 2021-01-17 ENCOUNTER — Ambulatory Visit: Payer: Medicare Other

## 2021-01-20 DIAGNOSIS — R262 Difficulty in walking, not elsewhere classified: Secondary | ICD-10-CM | POA: Diagnosis not present

## 2021-01-20 DIAGNOSIS — M25562 Pain in left knee: Secondary | ICD-10-CM | POA: Diagnosis not present

## 2021-01-20 DIAGNOSIS — M25561 Pain in right knee: Secondary | ICD-10-CM | POA: Diagnosis not present

## 2021-01-20 DIAGNOSIS — M25661 Stiffness of right knee, not elsewhere classified: Secondary | ICD-10-CM | POA: Diagnosis not present

## 2021-01-20 DIAGNOSIS — M25662 Stiffness of left knee, not elsewhere classified: Secondary | ICD-10-CM | POA: Diagnosis not present

## 2021-01-20 DIAGNOSIS — M6281 Muscle weakness (generalized): Secondary | ICD-10-CM | POA: Diagnosis not present

## 2021-01-20 NOTE — Addendum Note (Signed)
Addended by: Orland Mustard on: 01/20/2021 11:42 AM   Modules accepted: Orders

## 2021-01-28 DIAGNOSIS — M25661 Stiffness of right knee, not elsewhere classified: Secondary | ICD-10-CM | POA: Diagnosis not present

## 2021-01-28 DIAGNOSIS — R262 Difficulty in walking, not elsewhere classified: Secondary | ICD-10-CM | POA: Diagnosis not present

## 2021-01-28 DIAGNOSIS — M6281 Muscle weakness (generalized): Secondary | ICD-10-CM | POA: Diagnosis not present

## 2021-01-28 DIAGNOSIS — M25561 Pain in right knee: Secondary | ICD-10-CM | POA: Diagnosis not present

## 2021-01-28 DIAGNOSIS — M25662 Stiffness of left knee, not elsewhere classified: Secondary | ICD-10-CM | POA: Diagnosis not present

## 2021-01-28 DIAGNOSIS — M25562 Pain in left knee: Secondary | ICD-10-CM | POA: Diagnosis not present

## 2021-01-30 DIAGNOSIS — M6281 Muscle weakness (generalized): Secondary | ICD-10-CM | POA: Diagnosis not present

## 2021-01-30 DIAGNOSIS — M25561 Pain in right knee: Secondary | ICD-10-CM | POA: Diagnosis not present

## 2021-01-30 DIAGNOSIS — R262 Difficulty in walking, not elsewhere classified: Secondary | ICD-10-CM | POA: Diagnosis not present

## 2021-01-30 DIAGNOSIS — M25562 Pain in left knee: Secondary | ICD-10-CM | POA: Diagnosis not present

## 2021-01-30 DIAGNOSIS — M25661 Stiffness of right knee, not elsewhere classified: Secondary | ICD-10-CM | POA: Diagnosis not present

## 2021-01-30 DIAGNOSIS — M25662 Stiffness of left knee, not elsewhere classified: Secondary | ICD-10-CM | POA: Diagnosis not present

## 2021-02-03 NOTE — Progress Notes (Signed)
[place] Phone: [phone] Subjective:   Heather [name], am serving as a scribe for Dr. Hulan [name].  This visit occurred during the SARS-CoV-2 public health emergency.  Safety protocols were in place, including screening questions prior to the visit, additional usage of staff PPE, and extensive cleaning of exam room while observing appropriate contact time as indicated for disinfecting solutions.   I'm seeing this patient by the request  of:  Heather [name], Heather Glow, MD  CC: Knee pain follow-up, allover pain  FKC:[id number]  12/10/2020 Patient has done relatively well over the course of the last 6 years.  The patient has done well with the steroid injections as well as the viscosupplementation which was given again today.  Patient has also lost weight and has done conservative therapy including formal physical therapy but at this point he is now having worsening pain and exacerbation on a regular basis of this chronic problem.  Patient BMI is at 68 and patient is a candidate now for replacement.  Patient will be referred to orthopedic surgery to discuss surgical intervention.  Patient knows that I am here if she has any other questions.  Update 02/04/2021 Heather [name] is a 75 y.o. female coming in with complaint of B knee pain, L>R. Saw Dr. Rush [name] who is going to operate on L knee. Patient states that she is scheduled for surgery on January 10th. Pain 10/10 today. Did not respond to last injection and feels she her pain has gone downhill every since October. Using Tramadol for pain.  Patient states that it has been giving her some improvement.  When patient does take it it does help.  Also taking gabapentin regularly.      Past Medical History:  Diagnosis Date   Abdominal hernia    Abnormal Pap smear    ALLERGIC RHINITIS 10/22/2006   Allergy    ANEMIA 12/18/2008   Arthritis    scoliosis moderate deg  changes lumbar Xray 11/04/17    ASYMPTOMATIC POSTMENOPAUSAL STATUS 11/22/2007   Back pain    CAD (coronary artery disease)    in LAD   CHF (congestive heart failure) (HCC)    Complication of anesthesia    Degenerative arthritis    DIABETES MELLITUS, TYPE II 01/13/2007   Diverticulosis    Dyslipidemia    Fibroid    Frequent headaches    GERD 07/20/2007   GOITER, MULTINODULAR 07/20/2007   Headache(784.0) 07/20/2007   HEARING LOSS 11/22/2007   Hiatal hernia    Hiatal hernia    large   History of chicken pox    Hx of colposcopy with cervical biopsy    HYPERCHOLESTEROLEMIA 01/13/2007   Hyperglycemia    HYPERTENSION 10/22/2006   Lung nodule    unchanged since 05/26/13    Migraines    NASH (nonalcoholic steatohepatitis)    Nocturnal hypoxemia 02/17/2013   Obesity    Obstructive sleep apnea    OSA on CPAP    per neurology   OSTEOARTHRITIS 10/22/2006   Other chronic nonalcoholic liver disease 4/94/4967   Sedimentation rate elevation    Sleep apnea    on cpap   Urine incontinence    UTI (urinary tract infection)    Past Surgical History:  Procedure Laterality Date   BREAST SURGERY  1984   Breast reduction b/l    CATARACT EXTRACTION, BILATERAL     DEXA  08/2005   DILATION AND CURETTAGE OF UTERUS  ELECTROCARDIOGRAM  10/15/2006   ESOPHAGOGASTRODUODENOSCOPY  12/08/2005   FOOT SURGERY     hammertoe and bunion 05/2017    Stress Cardiolite  10/21/2005   sweat gland removal     WISDOM TOOTH EXTRACTION     Social History   Socioeconomic History   Marital status: Married    Spouse name: Heather [name]   Number of children: 2   Years of education: College   Highest education level: Not on file  Occupational History   Occupation: Retired  Tobacco Use   Smoking status: Former    Packs/day: 2.00    Years: 20.00    Pack years: 40.00    Types: Cigarettes    Quit date: 03/03/1979    Years since quitting: 41.9   Smokeless tobacco: Never  Substance and Sexual Activity   Alcohol use: Yes     Comment: rare - TWICE A YR   Drug use: [name]   Sexual activity: Yes    Birth control/protection: Surgical  Other Topics Concern   Not on file  Social History Narrative   Patient is married Heather [name]).   Right Handed   Drinks 1 cup caffeine   Social Determinants of Health   Financial Resource Strain: Low Risk    Difficulty of Paying Living Expenses: Not hard at all  Food Insecurity: [name] Food Insecurity   Worried About Charity fundraiser in the Last Year: Never true   Arboriculturist in the Last Year: Never true  Transportation Needs: [name] Transportation Needs   Lack of Transportation (Medical): [name]   Lack of Transportation (Non-Medical): [name]  Physical Activity: Not on file  Stress: [name] Stress Concern Present   Feeling of Stress : Not at all  Social Connections: Unknown   Frequency of Communication with Friends and Family: Not on file   Frequency of Social Gatherings with Friends and Family: Not on file   Attends Religious Services: Not on file   Active Member of Clubs or Organizations: Not on file   Attends Archivist Meetings: Not on file   Marital Status: Married   Allergies  Allergen Reactions   Aspirin    Coconut Oil    Lisinopril     REACTION: cough   Metoprolol Itching   Peanut-Containing Drug Products    Penicillins     REACTION: rash   Prednisone    Strawberry Extract    Family History  Problem Relation Age of Onset   Asthma Mother    Depression Mother    Bipolar disorder Mother    Dementia Mother    Arthritis Mother    Breast cancer Mother        primary   Colon cancer Mother        mets from breast   Cancer Mother        colon   Hypertension Father    Migraines Father    Cancer Maternal Aunt        ?   Heart disease Maternal Grandmother    Cancer Maternal Grandfather        stomach ?   Sleep apnea Neg Hx      Current Outpatient Medications (Cardiovascular):    ezetimibe (ZETIA) 10 MG tablet, Take 1 tablet (10 mg total) by mouth  daily.   furosemide (LASIX) 40 MG tablet, TAKE 1 TABLET DAILY AS     NEEDED**SOLCO   rosuvastatin (CRESTOR) 40 MG tablet, Take 1 tablet (40 mg total) by mouth daily. At night  Current Outpatient Medications (Respiratory):    cetirizine (ZYRTEC) 10 MG tablet, Take 10 mg by mouth daily.  Current Outpatient Medications (Analgesics):    acetaminophen (TYLENOL) 500 MG tablet, Take 650 mg by mouth 3 (three) times daily as needed for pain.    traMADol (ULTRAM) 50 MG tablet, Take 1 tablet (50 mg total) by mouth every 12 (twelve) hours as needed.  Current Outpatient Medications (Hematological):    clopidogrel (PLAVIX) 75 MG tablet, TAKE 1 TABLET DAILY   Cyanocobalamin 1000 MCG CAPS, Take 1 capsule by mouth daily.   Ferrous Sulfate (IRON) 325 (65 FE) MG TABS, Take 1 tablet by mouth daily.  Current Outpatient Medications (Other):    Calcium-Vitamin D-Vitamin K (VIACTIV CALCIUM PLUS D PO), Take by mouth.   COVID-19 mRNA Vac-TriS, Pfizer, (PFIZER-BIONT COVID-19 VAC-TRIS) SUSP injection, Inject into the muscle.   diclofenac Sodium (VOLTAREN) 1 % GEL, Apply topically 4 (four) times daily.   gabapentin (NEURONTIN) 100 MG capsule, Take 3 pills at bedtime   glucosamine-chondroitin 500-400 MG tablet, Take 1 tablet by mouth 2 (two) times daily.   Multiple Vitamin (MULTIVITAMIN) tablet, Take 1 tablet by mouth daily.   Omega-3 Fatty Acids (FISH OIL) 1000 MG CAPS, Take 1 capsule by mouth daily.   pantoprazole (PROTONIX) 20 MG tablet, TAKE 1 TABLET DAILY 30     MINUTES BEFORE FOOD        (DISCONTINUE DEXILANT,     DISCONTINUE 40MG DOSE)   polyethylene glycol (MIRALAX / GLYCOLAX) 17 g packet, Take 17 g by mouth daily as needed.   potassium chloride SA (KLOR-CON M20) 20 MEQ tablet, Take 1 tablet (20 mEq total) by mouth daily.   pyridOXINE (VITAMIN B-6) 100 MG tablet, Take 200 mg by mouth daily.   SF 5000 PLUS 1.1 % CREA dental cream,    topiramate (TOPAMAX) 25 MG tablet, Take 3 tablets (75 mg total) by mouth at  bedtime.   trospium (SANCTURA) 20 MG tablet, Daily at night after 1-2 weeks increase to bid   Turmeric 500 MG CAPS, Take 1 capsule by mouth daily.   UNABLE TO FIND, Instaflex for knees   Vitamin D, Ergocalciferol, (DRISDOL) 1.25 MG (50000 UNIT) CAPS capsule, Take 1 capsule (50,000 Units total) by mouth every 7 (seven) days.   vitamin E 200 UNIT capsule, Take 200 Units by mouth daily.    Review of Systems:  [name] headache, visual changes, nausea, vomiting, diarrhea, constipation, dizziness, abdominal pain, skin rash, fevers, chills, night sweats, weight loss, swollen lymph nodes, body aches, joint swelling, chest pain, shortness of breath, mood changes. POSITIVE muscle aches  Objective  Blood pressure 122/76, pulse 73, height 5' 5"  (1.651 m), weight 206 lb (93.4 kg), SpO2 96 %.   General: [name] apparent distress alert and oriented x3 mood and affect normal, dressed appropriately.  HEENT: Pupils equal, extraocular movements intact  Respiratory: Patient's speak in full sentences and does not appear short of breath  Cardiovascular: Trace lower extremity edema, non tender, [name] erythema  Gait severely antalgic walking with the aid of a cane On visual inspection patient's knees do have some effusion noted.   Impression and Recommendations:     The above documentation has been reviewed and is accurate and complete Lyndal Pulley, DO

## 2021-02-04 ENCOUNTER — Other Ambulatory Visit: Payer: Self-pay

## 2021-02-04 ENCOUNTER — Ambulatory Visit (INDEPENDENT_AMBULATORY_CARE_PROVIDER_SITE_OTHER): Payer: Medicare Other | Admitting: Family Medicine

## 2021-02-04 ENCOUNTER — Encounter: Payer: Self-pay | Admitting: Family Medicine

## 2021-02-04 DIAGNOSIS — M6281 Muscle weakness (generalized): Secondary | ICD-10-CM | POA: Diagnosis not present

## 2021-02-04 DIAGNOSIS — M25561 Pain in right knee: Secondary | ICD-10-CM | POA: Diagnosis not present

## 2021-02-04 DIAGNOSIS — M25562 Pain in left knee: Secondary | ICD-10-CM | POA: Diagnosis not present

## 2021-02-04 DIAGNOSIS — M170 Bilateral primary osteoarthritis of knee: Secondary | ICD-10-CM

## 2021-02-04 DIAGNOSIS — I2510 Atherosclerotic heart disease of native coronary artery without angina pectoris: Secondary | ICD-10-CM | POA: Diagnosis not present

## 2021-02-04 DIAGNOSIS — R262 Difficulty in walking, not elsewhere classified: Secondary | ICD-10-CM | POA: Diagnosis not present

## 2021-02-04 DIAGNOSIS — M25662 Stiffness of left knee, not elsewhere classified: Secondary | ICD-10-CM | POA: Diagnosis not present

## 2021-02-04 DIAGNOSIS — M25661 Stiffness of right knee, not elsewhere classified: Secondary | ICD-10-CM | POA: Diagnosis not present

## 2021-02-04 NOTE — Patient Instructions (Signed)
You will do wonderful with surgery See me again in 2 months or after 2nd knee replacement

## 2021-02-04 NOTE — Assessment & Plan Note (Signed)
Significant arthritis the patient has been dealing with for greater than 5 years at this point.  Patient is scheduled now to have the replacement on the left side and then 3 months later likely will have a replacement on the right side.  Discussed with patient we are here if she has any questions.  Patient to bring up other things such as multiple body aches and we could consider further laboratory work-up but at this point I would like patient to see how she responds to the knee injections first.  Patient is in agreement with the plan but will have the opportunity if he has any questions.

## 2021-02-10 ENCOUNTER — Telehealth: Payer: Self-pay | Admitting: Internal Medicine

## 2021-02-10 NOTE — Telephone Encounter (Signed)
Faxed signed ROI to [facility] requesting patient's most recent mammogram. Received confirmation of fax going through.   ROI sent to scan.

## 2021-02-11 ENCOUNTER — Encounter (INDEPENDENT_AMBULATORY_CARE_PROVIDER_SITE_OTHER): Payer: Self-pay | Admitting: Family Medicine

## 2021-02-11 ENCOUNTER — Ambulatory Visit (INDEPENDENT_AMBULATORY_CARE_PROVIDER_SITE_OTHER): Payer: Medicare Other | Admitting: Family Medicine

## 2021-02-11 ENCOUNTER — Other Ambulatory Visit: Payer: Self-pay

## 2021-02-11 VITALS — BP 109/66 | HR 69 | Temp 98.2°F | Ht 65.0 in | Wt 207.0 lb

## 2021-02-11 DIAGNOSIS — Z6834 Body mass index (BMI) 34.0-34.9, adult: Secondary | ICD-10-CM

## 2021-02-11 DIAGNOSIS — E559 Vitamin D deficiency, unspecified: Secondary | ICD-10-CM | POA: Diagnosis not present

## 2021-02-11 DIAGNOSIS — E669 Obesity, unspecified: Secondary | ICD-10-CM

## 2021-02-11 DIAGNOSIS — F3289 Other specified depressive episodes: Secondary | ICD-10-CM | POA: Diagnosis not present

## 2021-02-11 MED ORDER — VITAMIN D (ERGOCALCIFEROL) 1.25 MG (50000 UNIT) PO CAPS: 50000.0000 [IU] | ORAL_CAPSULE | ORAL | 0 refills | Status: DC

## 2021-02-11 NOTE — Progress Notes (Signed)
Chief Complaint:   OBESITY Heather [name] is here to discuss her progress with her obesity treatment plan along with follow-up of her obesity related diagnoses. Heather [name] is on keeping a food journal and adhering to recommended goals of 1200 calories and 75+ grams of protein daily and states she is following her eating plan approximately 50% of the time. Heather [name] states she is doing 0 minutes 0 times per week.  Today's visit was #: 62 Starting weight: 268 lbs Starting date: 12/23/2016 Today's weight: 207 lbs Today's date: 02/11/2021 Total lbs lost to date: 61 Total lbs lost since last in-office visit: 0  Interim History: Heather [name] has done some celebrations eating since her last visit, but she is trying to meet her protein goals. She is expecting some sabotage from her family over the holidays.  Subjective:   1. Vitamin D deficiency Heather [name]'s last Vit D level was at 74, and at goal.  2. Other depression with emotional eating Heather [name] notes increased emotional eating behaviors, especially since her chronic pain has worsened.  Assessment/Plan:   1. Vitamin D deficiency Low Vitamin D level contributes to fatigue and are associated with obesity, breast, and colon cancer. We will refill prescription Vitamin D for 1 month. Heather [name] will follow-up for routine testing of Vitamin D, at least 2-3 times per year to avoid over-replacement.  - Vitamin D, Ergocalciferol, (DRISDOL) 1.25 MG (50000 UNIT) CAPS capsule; Take 1 capsule (50,000 Units total) by mouth every 7 (seven) days.  Dispense: 4 capsule; Refill: 0  2. Other depression with emotional eating Emotional eating behavior strategies were discussed today to help Heather [name] deal with her emotional/non-hunger eating behaviors. We will continue to monitor. Orders and follow up as documented in patient record.   3. Obesity with current BMI 34.4 Heather [name] is currently in the action stage of change. As such, her goal is to continue with weight loss efforts. She has agreed  to the Category 2 Plan.   Behavioral modification strategies: increasing lean protein intake, emotional eating strategies, dealing with family or coworker sabotage, and holiday eating strategies .  Heather [name] has agreed to follow-up with our clinic in 4 weeks. She was informed of the importance of frequent follow-up visits to maximize her success with intensive lifestyle modifications for her multiple health conditions.   Objective:   Blood pressure 109/66, pulse 69, temperature 98.2 F (36.8 C), height 5' 5"  (1.651 m), weight 207 lb (93.9 kg), SpO2 99 %. Body mass index is 34.45 kg/m.  General: Cooperative, alert, well developed, in no acute distress. HEENT: Conjunctivae and lids unremarkable. Cardiovascular: Regular rhythm.  Lungs: Normal work of breathing. Neurologic: No focal deficits.   Lab Results  Component Value Date   CREATININE 0.78 11/25/2020   BUN 15 11/25/2020   NA 141 11/25/2020   K 3.9 11/25/2020   CL 103 11/25/2020   CO2 23 11/25/2020   Lab Results  Component Value Date   ALT 13 11/25/2020   AST 18 11/25/2020   ALKPHOS 60 11/25/2020   BILITOT <0.2 11/25/2020   Lab Results  Component Value Date   HGBA1C 6.1 (H) 11/25/2020   HGBA1C 5.9 06/21/2020   HGBA1C 5.8 (H) 04/30/2020   HGBA1C 5.6 10/09/2019   HGBA1C 5.8 (H) 07/05/2019   Lab Results  Component Value Date   INSULIN 6.0 11/25/2020   INSULIN 4.3 10/09/2019   INSULIN 8.6 07/05/2019   INSULIN 6.4 12/05/2018   INSULIN 3.6 04/27/2018   Lab Results  Component Value Date   TSH  2.23 06/21/2020   Lab Results  Component Value Date   CHOL 149 11/25/2020   HDL 62 11/25/2020   LDLCALC 73 11/25/2020   TRIG 73 11/25/2020   CHOLHDL 2.1 07/17/2020   Lab Results  Component Value Date   VD25OH 66.2 11/25/2020   VD25OH 58.1 04/30/2020   VD25OH 42.0 10/09/2019   Lab Results  Component Value Date   WBC 3.4 11/25/2020   HGB 13.7 11/25/2020   HCT 43.2 11/25/2020   MCV 98 (H) 11/25/2020   PLT 209  11/25/2020   Lab Results  Component Value Date   IRON 91 12/07/2019   TIBC 255 12/07/2019   FERRITIN 147 12/07/2019    Obesity Behavioral Intervention:   Approximately 15 minutes were spent on the discussion below.  ASK: We discussed the diagnosis of obesity with Heather [name] today and Heather [name] agreed to give [name] permission to discuss obesity behavioral modification therapy today.  ASSESS: Heather [name] has the diagnosis of obesity and her BMI today is 34.4. Heather [name] is in the action stage of change.   ADVISE: Heather [name] was educated on the multiple health risks of obesity as well as the benefit of weight loss to improve her health. She was advised of the need for long term treatment and the importance of lifestyle modifications to improve her current health and to decrease her risk of future health problems.  AGREE: Multiple dietary modification options and treatment options were discussed and Heather [name] agreed to follow the recommendations documented in the above note.  ARRANGE: Heather [name] was educated on the importance of frequent visits to treat obesity as outlined per CMS and USPSTF guidelines and agreed to schedule her next follow up appointment today.  Attestation Statements:   Reviewed by clinician on day of visit: allergies, medications, problem list, medical history, surgical history, family history, social history, and previous encounter notes.   I, [name], am acting as transcriptionist for [name], MD.  I have reviewed the above documentation for accuracy and completeness, and I agree with the above. -  [name], MD

## 2021-02-13 ENCOUNTER — Other Ambulatory Visit: Payer: Self-pay | Admitting: Physician Assistant

## 2021-02-13 DIAGNOSIS — M1712 Unilateral primary osteoarthritis, left knee: Secondary | ICD-10-CM

## 2021-02-18 DIAGNOSIS — M25561 Pain in right knee: Secondary | ICD-10-CM | POA: Diagnosis not present

## 2021-02-18 DIAGNOSIS — M6281 Muscle weakness (generalized): Secondary | ICD-10-CM | POA: Diagnosis not present

## 2021-02-18 DIAGNOSIS — R262 Difficulty in walking, not elsewhere classified: Secondary | ICD-10-CM | POA: Diagnosis not present

## 2021-02-18 DIAGNOSIS — M25562 Pain in left knee: Secondary | ICD-10-CM | POA: Diagnosis not present

## 2021-02-18 DIAGNOSIS — M25661 Stiffness of right knee, not elsewhere classified: Secondary | ICD-10-CM | POA: Diagnosis not present

## 2021-02-18 DIAGNOSIS — M25662 Stiffness of left knee, not elsewhere classified: Secondary | ICD-10-CM | POA: Diagnosis not present

## 2021-02-20 ENCOUNTER — Encounter: Payer: Self-pay | Admitting: Internal Medicine

## 2021-02-20 NOTE — Telephone Encounter (Signed)
Okay to send in? 60 tablets no refills sent in 01/02/21

## 2021-02-21 ENCOUNTER — Other Ambulatory Visit: Payer: Self-pay | Admitting: Internal Medicine

## 2021-02-21 DIAGNOSIS — N3281 Overactive bladder: Secondary | ICD-10-CM

## 2021-02-21 MED ORDER — TROSPIUM CHLORIDE 20 MG PO TABS: 20.0000 mg | ORAL_TABLET | Freq: Two times a day (BID) | ORAL | 11 refills | Status: DC

## 2021-02-27 ENCOUNTER — Telehealth: Payer: Self-pay | Admitting: Orthopaedic Surgery

## 2021-02-27 DIAGNOSIS — M25561 Pain in right knee: Secondary | ICD-10-CM | POA: Diagnosis not present

## 2021-02-27 DIAGNOSIS — M6281 Muscle weakness (generalized): Secondary | ICD-10-CM | POA: Diagnosis not present

## 2021-02-27 DIAGNOSIS — M25661 Stiffness of right knee, not elsewhere classified: Secondary | ICD-10-CM | POA: Diagnosis not present

## 2021-02-27 DIAGNOSIS — R262 Difficulty in walking, not elsewhere classified: Secondary | ICD-10-CM | POA: Diagnosis not present

## 2021-02-27 DIAGNOSIS — M25562 Pain in left knee: Secondary | ICD-10-CM | POA: Diagnosis not present

## 2021-02-27 DIAGNOSIS — M25662 Stiffness of left knee, not elsewhere classified: Secondary | ICD-10-CM | POA: Diagnosis not present

## 2021-02-27 NOTE — Telephone Encounter (Signed)
Pt called requesting a call back. Pt states she has question about stopping her medications before her upcoming surgery in Jan. Please call pt about this matter at [phone].

## 2021-02-27 NOTE — Telephone Encounter (Signed)
Any meds she needs to stop?

## 2021-03-04 ENCOUNTER — Encounter (INDEPENDENT_AMBULATORY_CARE_PROVIDER_SITE_OTHER): Payer: Self-pay | Admitting: Adult Health

## 2021-03-04 ENCOUNTER — Ambulatory Visit (INDEPENDENT_AMBULATORY_CARE_PROVIDER_SITE_OTHER): Payer: Medicare Other | Admitting: Adult Health

## 2021-03-04 ENCOUNTER — Other Ambulatory Visit: Payer: Self-pay

## 2021-03-04 VITALS — BP 120/72 | HR 70 | Temp 98.1°F | Ht 65.0 in | Wt 209.0 lb

## 2021-03-04 DIAGNOSIS — Z6834 Body mass index (BMI) 34.0-34.9, adult: Secondary | ICD-10-CM | POA: Diagnosis not present

## 2021-03-04 DIAGNOSIS — E559 Vitamin D deficiency, unspecified: Secondary | ICD-10-CM | POA: Diagnosis not present

## 2021-03-04 DIAGNOSIS — E669 Obesity, unspecified: Secondary | ICD-10-CM | POA: Diagnosis not present

## 2021-03-04 DIAGNOSIS — M25562 Pain in left knee: Secondary | ICD-10-CM

## 2021-03-04 MED ORDER — VITAMIN D (ERGOCALCIFEROL) 1.25 MG (50000 UNIT) PO CAPS: 50000.0000 [IU] | ORAL_CAPSULE | ORAL | 0 refills | Status: DC

## 2021-03-04 NOTE — Progress Notes (Signed)
Chief Complaint:   OBESITY Heather [name] is here to discuss her progress with her obesity treatment plan along with follow-up of her obesity related diagnoses. Heather [name] is on the Category 2 Plan and states she is following her eating plan approximately 0% of the time. Heather [name] states she is doing PT for 60 minutes 1 time per week.  Today's visit was #: 8 Starting weight: 268 lbs Starting date: 12/23/2016 Today's weight: 209 lbs Today's date: 03/04/2021 Total lbs lost to date: 59 lbs Total lbs lost since last in-office visit: 0  Interim History:  Heather [name] is scheduled for left total knee arthroplasty on 03/11/2020 - Her husband and 3 adult children will assist with her recovery. Her 3 adult (ages 76,42,45) children enjoyed Christmas at home - she cooked/served her family all of the holiday favorites. - mac-n-cheese, shrimp-n-rice, chicken wings.  Subjective:   1. Left knee pain, unspecified chronicity She is so excited to have her knee pain and immobility finally addressed. Pain is 17/10.  She will be hospitalized overnight, then hopes to be dc'd home the following day.   Her husband and 3 adult children will assist with her recovery.  2. Vitamin D deficiency On 11/25/2020 - vitamin D level - 66.2 - stable. She is currently taking prescription ergocalciferol 50,000 IU each week. She denies nausea, vomiting or muscle weakness.  Assessment/Plan:   1. Left knee pain, unspecified chronicity Increase protein pre and postop. Follow-up post-op instructions per surgeon and PT.  2. Vitamin D deficiency Refill ergocalciferol 50,000 IU once weekly.  - Refill Vitamin D, Ergocalciferol, (DRISDOL) 1.25 MG (50000 UNIT) CAPS capsule; Take 1 capsule (50,000 Units total) by mouth every 7 (seven) days.  Dispense: 4 capsule; Refill: 0  3. Obesity with current BMI 34.9  Heather [name] is currently in the action stage of change. As such, her goal is to continue with weight loss efforts. She has agreed to the Category 2  Plan.   Exercise goals: No exercise has been prescribed at this time.  Left total knee arthroplasty.  Behavioral modification strategies: increasing lean protein intake, decreasing simple carbohydrates, meal planning and cooking strategies, keeping healthy foods in the home, and planning for success.  Heather [name] has agreed to follow-up with our clinic in 6 weeks. She was informed of the importance of frequent follow-up visits to maximize her success with intensive lifestyle modifications for her multiple health conditions.   Objective:   Blood pressure 120/72, pulse 70, temperature 98.1 F (36.7 C), height _0  (1.651 m), weight 209 lb (94.8 kg), SpO2 97 %. Body mass index is 34.78 kg/m.  General: Cooperative, alert, well developed, in no acute distress. HEENT: Conjunctivae and lids unremarkable. Cardiovascular: Regular rhythm.  Lungs: Normal work of breathing. Neurologic: No focal deficits.   Lab Results  Component Value Date   CREATININE 0.78 11/25/2020   BUN 15 11/25/2020   NA 141 11/25/2020   K 3.9 11/25/2020   CL 103 11/25/2020   CO2 23 11/25/2020   Lab Results  Component Value Date   ALT 13 11/25/2020   AST 18 11/25/2020   ALKPHOS 60 11/25/2020   BILITOT <0.2 11/25/2020   Lab Results  Component Value Date   HGBA1C 6.1 (H) 11/25/2020   HGBA1C 5.9 06/21/2020   HGBA1C 5.8 (H) 04/30/2020   HGBA1C 5.6 10/09/2019   HGBA1C 5.8 (H) 07/05/2019   Lab Results  Component Value Date   INSULIN 6.0 11/25/2020   INSULIN 4.3 10/09/2019   INSULIN 8.6 07/05/2019  INSULIN 6.4 12/05/2018   INSULIN 3.6 04/27/2018   Lab Results  Component Value Date   TSH 2.23 06/21/2020   Lab Results  Component Value Date   CHOL 149 11/25/2020   HDL 62 11/25/2020   LDLCALC 73 11/25/2020   TRIG 73 11/25/2020   CHOLHDL 2.1 07/17/2020   Lab Results  Component Value Date   VD25OH 66.2 11/25/2020   VD25OH 58.1 04/30/2020   VD25OH 42.0 10/09/2019   Lab Results  Component Value Date    WBC 3.4 11/25/2020   HGB 13.7 11/25/2020   HCT 43.2 11/25/2020   MCV 98 (H) 11/25/2020   PLT 209 11/25/2020   Lab Results  Component Value Date   IRON 91 12/07/2019   TIBC 255 12/07/2019   FERRITIN 147 12/07/2019   Obesity Behavioral Intervention:   Approximately 15 minutes were spent on the discussion below.  ASK: We discussed the diagnosis of obesity with Heather [name] today and Heather [name] agreed to give [name] permission to discuss obesity behavioral modification therapy today.  ASSESS: Heather [name] has the diagnosis of obesity and her BMI today is 34.9. Heather [name] is in the action stage of change.   ADVISE: Heather [name] was educated on the multiple health risks of obesity as well as the benefit of weight loss to improve her health. She was advised of the need for long term treatment and the importance of lifestyle modifications to improve her current health and to decrease her risk of future health problems.  AGREE: Multiple dietary modification options and treatment options were discussed and Heather [name] agreed to follow the recommendations documented in the above note.  ARRANGE: Heather [name] was educated on the importance of frequent visits to treat obesity as outlined per CMS and USPSTF guidelines and agreed to schedule her next follow up appointment today.  Attestation Statements:   Reviewed by clinician on day of visit: allergies, medications, problem list, medical history, surgical history, family history, social history, and previous encounter notes.  I, Water quality scientist, CMA, am acting as Location manager for Mina Marble, NP.  I have reviewed the above documentation for accuracy and completeness, and I agree with the above. -  Ellanie Oppedisano d. Keelan Tripodi, NP-C

## 2021-03-05 ENCOUNTER — Other Ambulatory Visit: Payer: Self-pay

## 2021-03-05 DIAGNOSIS — R262 Difficulty in walking, not elsewhere classified: Secondary | ICD-10-CM | POA: Diagnosis not present

## 2021-03-05 DIAGNOSIS — M25661 Stiffness of right knee, not elsewhere classified: Secondary | ICD-10-CM | POA: Diagnosis not present

## 2021-03-05 DIAGNOSIS — M25662 Stiffness of left knee, not elsewhere classified: Secondary | ICD-10-CM | POA: Diagnosis not present

## 2021-03-05 DIAGNOSIS — M25562 Pain in left knee: Secondary | ICD-10-CM | POA: Diagnosis not present

## 2021-03-05 DIAGNOSIS — M25561 Pain in right knee: Secondary | ICD-10-CM | POA: Diagnosis not present

## 2021-03-05 DIAGNOSIS — M6281 Muscle weakness (generalized): Secondary | ICD-10-CM | POA: Diagnosis not present

## 2021-03-06 NOTE — Pre-Procedure Instructions (Signed)
Surgical Instructions    Your procedure is scheduled on Tuesday, January 10th.  Report to [facility] Main Entrance "A" at 10:00 A.M., then check in with the Admitting office.  Call this number if you have problems the morning of surgery:  [phone]   If you have any questions prior to your surgery date call [phone]: Open Monday-Friday 8am-4pm    Remember:  Do not eat after midnight the night before your surgery  You may drink clear liquids until 09:00 AM the morning of your surgery.   Clear liquids allowed are: Water, Non-Citrus Juices (without pulp), Carbonated Beverages, Clear Tea, Black Coffee Only, and Gatorade   Patient Instructions  The night before surgery:  No food after midnight. ONLY clear liquids after midnight   The day of surgery (if you have diabetes): Drink ONE (1) 12 oz G2 given to you in your pre admission testing appointment by 09:00 AM the morning of surgery. Drink in one sitting. Do not sip.  This drink was given to you during your hospital  pre-op appointment visit.  Nothing else to drink after completing the  12 oz bottle of G2.         If you have questions, please contact your surgeons office.      Take these medicines the morning of surgery with A SIP OF WATER  cetirizine (ZYRTEC) ezetimibe (ZETIA) pantoprazole (PROTONIX) trospium (SANCTURA)   If needed: acetaminophen (TYLENOL)  traMADol (ULTRAM)   As of today, STOP taking any Aspirin (unless otherwise instructed by your surgeon) Aleve, Naproxen, Ibuprofen, Motrin, Advil, Goody's, BC's, all herbal medications, fish oil, and all vitamins. This includes your diclofenac Sodium (VOLTAREN) 1 % GEL.                     Do NOT Smoke (Tobacco/Vaping) or drink Alcohol 24 hours prior to your procedure.  If you use a CPAP at night, you may bring all equipment for your overnight stay.   Contacts, glasses, piercing's, hearing aid's, dentures or partials may not be worn into surgery, please  bring cases for these belongings.    For patients admitted to the hospital, discharge time will be determined by your treatment team.   Patients discharged the day of surgery will not be allowed to drive home, and someone needs to stay with them for 24 hours.  NO VISITORS WILL BE ALLOWED IN PRE-OP WHERE PATIENTS GET READY FOR SURGERY.  ONLY 1 SUPPORT PERSON MAY BE PRESENT IN THE WAITING ROOM WHILE YOU ARE IN SURGERY.  IF YOU ARE TO BE ADMITTED, ONCE YOU ARE IN YOUR ROOM YOU WILL BE ALLOWED TWO (2) VISITORS.  Minor children may have two parents present. Special consideration for safety and communication needs will be reviewed on a case by case basis.   Special instructions:   [place]- Preparing For Surgery  Before surgery, you can play an important role. Because skin is not sterile, your skin needs to be as free of germs as possible. You can reduce the number of germs on your skin by washing with CHG (chlorahexidine gluconate) Soap before surgery.  CHG is an antiseptic cleaner which kills germs and bonds with the skin to continue killing germs even after washing.    Oral Hygiene is also important to reduce your risk of infection.  Remember - BRUSH YOUR TEETH THE MORNING OF SURGERY WITH YOUR REGULAR TOOTHPASTE  Please do not use if you have an allergy to CHG or antibacterial soaps. If your  skin becomes reddened/irritated stop using the CHG.  Do not shave (including legs and underarms) for at least 48 hours prior to first CHG shower. It is OK to shave your face.  Please follow these instructions carefully.   Shower the NIGHT BEFORE SURGERY and the MORNING OF SURGERY  If you chose to wash your hair, wash your hair first as usual with your normal shampoo.  After you shampoo, rinse your hair and body thoroughly to remove the shampoo.  Use CHG Soap as you would any other liquid soap. You can apply CHG directly to the skin and wash gently with a scrungie or a clean washcloth.   Apply the CHG  Soap to your body ONLY FROM THE NECK DOWN.  Do not use on open wounds or open sores. Avoid contact with your eyes, ears, mouth and genitals (private parts). Wash Face and genitals (private parts)  with your normal soap.   Wash thoroughly, paying special attention to the area where your surgery will be performed.  Thoroughly rinse your body with warm water from the neck down.  DO NOT shower/wash with your normal soap after using and rinsing off the CHG Soap.  Pat yourself dry with a CLEAN TOWEL.  Wear CLEAN PAJAMAS to bed the night before surgery  Place CLEAN SHEETS on your bed the night before your surgery  DO NOT SLEEP WITH PETS.   Day of Surgery: Shower with CHG soap. Do not wear jewelry, make up, nail polish, gel polish, artificial nails, or any other type of covering on natural nails including finger and toenails. If patients have artificial nails, gel coating, etc. that need to be removed by a nail salon please have this removed prior to surgery. Surgery may need to be canceled/delayed if the surgeon/ anesthesia feels like the patient is unable to be adequately monitored. Do not wear lotions, powders, perfumes, or deodorant. Do not shave 48 hours prior to surgery.   Do not bring valuables to the hospital. [facility] is not responsible for any belongings or valuables. Wear Clean/Comfortable clothing the morning of surgery Remember to brush your teeth WITH YOUR REGULAR TOOTHPASTE.   Please read over the following fact sheets that you were given.   3 days prior to your procedure or After your COVID test   You are not required to quarantine however you are required to wear a well-fitting mask when you are out and around people not in your household. If your mask becomes wet or soiled, replace with a new one.   Wash your hands often with soap and water for 20 seconds or clean your hands with an alcohol-based hand sanitizer that contains at least 60% alcohol.   Do not share  personal items.   Notify your provider:  o if you are in close contact with someone who has COVID  o or if you develop a fever of 100.4 or greater, sneezing, cough, sore throat, shortness of breath or body aches.

## 2021-03-07 ENCOUNTER — Encounter (HOSPITAL_COMMUNITY): Payer: Self-pay

## 2021-03-07 ENCOUNTER — Encounter (HOSPITAL_COMMUNITY)
Admission: RE | Admit: 2021-03-07 | Discharge: 2021-03-07 | Disposition: A | Discharge status: Home / Self Care | Payer: Medicare Other | Source: Ambulatory Visit | Attending: Orthopaedic Surgery | Admitting: Orthopaedic Surgery

## 2021-03-07 ENCOUNTER — Other Ambulatory Visit: Payer: Self-pay

## 2021-03-07 VITALS — BP 140/63 | HR 68 | Temp 97.8°F | Resp 18 | Ht 65.0 in | Wt 215.2 lb

## 2021-03-07 DIAGNOSIS — Z9981 Dependence on supplemental oxygen: Secondary | ICD-10-CM | POA: Insufficient documentation

## 2021-03-07 DIAGNOSIS — Z9989 Dependence on other enabling machines and devices: Secondary | ICD-10-CM | POA: Diagnosis not present

## 2021-03-07 DIAGNOSIS — Z7902 Long term (current) use of antithrombotics/antiplatelets: Secondary | ICD-10-CM | POA: Diagnosis not present

## 2021-03-07 DIAGNOSIS — D510 Vitamin B12 deficiency anemia due to intrinsic factor deficiency: Secondary | ICD-10-CM | POA: Diagnosis not present

## 2021-03-07 DIAGNOSIS — Z886 Allergy status to analgesic agent status: Secondary | ICD-10-CM | POA: Diagnosis not present

## 2021-03-07 DIAGNOSIS — E119 Type 2 diabetes mellitus without complications: Secondary | ICD-10-CM | POA: Insufficient documentation

## 2021-03-07 DIAGNOSIS — K7581 Nonalcoholic steatohepatitis (NASH): Secondary | ICD-10-CM | POA: Diagnosis not present

## 2021-03-07 DIAGNOSIS — I2510 Atherosclerotic heart disease of native coronary artery without angina pectoris: Secondary | ICD-10-CM | POA: Insufficient documentation

## 2021-03-07 DIAGNOSIS — M1712 Unilateral primary osteoarthritis, left knee: Secondary | ICD-10-CM | POA: Insufficient documentation

## 2021-03-07 DIAGNOSIS — I272 Pulmonary hypertension, unspecified: Secondary | ICD-10-CM | POA: Diagnosis not present

## 2021-03-07 DIAGNOSIS — D72829 Elevated white blood cell count, unspecified: Secondary | ICD-10-CM | POA: Insufficient documentation

## 2021-03-07 DIAGNOSIS — Z01812 Encounter for preprocedural laboratory examination: Secondary | ICD-10-CM | POA: Insufficient documentation

## 2021-03-07 DIAGNOSIS — Z01818 Encounter for other preprocedural examination: Secondary | ICD-10-CM

## 2021-03-07 DIAGNOSIS — Z20822 Contact with and (suspected) exposure to covid-19: Secondary | ICD-10-CM | POA: Diagnosis not present

## 2021-03-07 DIAGNOSIS — K449 Diaphragmatic hernia without obstruction or gangrene: Secondary | ICD-10-CM | POA: Diagnosis not present

## 2021-03-07 DIAGNOSIS — E042 Nontoxic multinodular goiter: Secondary | ICD-10-CM | POA: Diagnosis not present

## 2021-03-07 DIAGNOSIS — G4733 Obstructive sleep apnea (adult) (pediatric): Secondary | ICD-10-CM | POA: Insufficient documentation

## 2021-03-07 DIAGNOSIS — M25569 Pain in unspecified knee: Secondary | ICD-10-CM | POA: Diagnosis not present

## 2021-03-07 HISTORY — DX: Unspecified asthma, uncomplicated: J45.909

## 2021-03-07 LAB — TYPE AND SCREEN
ABO/RH(D): B POS
Antibody Screen: NEGATIVE

## 2021-03-07 LAB — SURGICAL PCR SCREEN
MRSA, PCR: NEGATIVE
Staphylococcus aureus: NEGATIVE

## 2021-03-07 LAB — CBC
HCT: 41.1 % (ref 36.0–46.0)
Hemoglobin: 13 g/dL (ref 12.0–15.0)
MCH: 31.3 pg (ref 26.0–34.0)
MCHC: 31.6 g/dL (ref 30.0–36.0)
MCV: 98.8 fL (ref 80.0–100.0)
Platelets: 189 10*3/uL (ref 150–400)
RBC: 4.16 MIL/uL (ref 3.87–5.11)
RDW: 14.7 % (ref 11.5–15.5)
WBC: 3.6 10*3/uL — ABNORMAL LOW (ref 4.0–10.5)
nRBC: 0 % (ref 0.0–0.2)

## 2021-03-07 LAB — COMPREHENSIVE METABOLIC PANEL WITH GFR
ALT: 19 U/L (ref 0–44)
AST: 24 U/L (ref 15–41)
Albumin: 3.4 g/dL — ABNORMAL LOW (ref 3.5–5.0)
Alkaline Phosphatase: 56 U/L (ref 38–126)
Anion gap: 4 — ABNORMAL LOW (ref 5–15)
BUN: 13 mg/dL (ref 8–23)
CO2: 27 mmol/L (ref 22–32)
Calcium: 9 mg/dL (ref 8.9–10.3)
Chloride: 108 mmol/L (ref 98–111)
Creatinine, Ser: 0.86 mg/dL (ref 0.44–1.00)
GFR, Estimated: >60 mL/min
Glucose, Bld: 95 mg/dL (ref 70–99)
Potassium: 3.6 mmol/L (ref 3.5–5.1)
Sodium: 139 mmol/L (ref 135–145)
Total Bilirubin: 0.5 mg/dL (ref 0.3–1.2)
Total Protein: 6.9 g/dL (ref 6.5–8.1)

## 2021-03-07 LAB — SARS CORONAVIRUS 2 (TAT 6-24 HRS): SARS Coronavirus 2: NEGATIVE

## 2021-03-07 LAB — HEMOGLOBIN A1C
Hgb A1c MFr Bld: 5.8 % — ABNORMAL HIGH (ref 4.8–5.6)
Mean Plasma Glucose: 119.76 mg/dL

## 2021-03-07 LAB — GLUCOSE, CAPILLARY: Glucose-Capillary: 97 mg/dL (ref 70–99)

## 2021-03-07 NOTE — Progress Notes (Signed)
PCP - Dr. Olivia Mackie McLean-Scocuzza [facility]) Cardiologist - Dr. [name]  PPM/ICD - denies   Chest x-ray - 02/15/15 EKG - 05/08/20 Stress Test - 10/21/2005 ECHO - 01/17/18 Cardiac Cath - denies  Sleep Study - 07/04/2018 CPAP - yes  DM- Type 2 [name]  Blood Thinner Instructions: pt instructed to hold Plavix 7 days. Last dose 03/04/21. Aspirin Instructions: n/a  ERAS Protcol - yes PRE-SURGERY G2- given at PAT  COVID TEST- 03/07/21 at PAT   Anesthesia review: yes, cardiac hx. Clearance?  Patient denies shortness of breath, fever, cough and chest pain at PAT appointment   All instructions explained to the patient, with a verbal understanding of the material. Patient agrees to go over the instructions while at home for a better understanding. Patient also instructed to wear a mask in public after being tested for COVID-19. The opportunity to ask questions was provided.

## 2021-03-10 ENCOUNTER — Telehealth: Payer: Self-pay | Admitting: *Deleted

## 2021-03-10 NOTE — Progress Notes (Addendum)
Anesthesia Chart Review:  Follows with cardiology for hx of CAD by coronary calcium on CT chest, OSA on O2 and Bipap, and HTN. She had a workup for exertional dyspnea in 6/14 by Dr Chase Caller.  PFTs were restrictive with normal DLCO, suggesting abnormality was from body habitus.  CT chest showed coronary calcification.  She had an echo showed normal EF and a Lexiscan Cardiolite in 8/14 that was normal (Per cardiology note). Echo 12/2017 showed EF 60-65%, normal RV, PASP 43 mmHg. Pt last seen by Dr. Aundra Dubin 05/08/20. Stable at that time, primary complaint was knee pain. Recommended continue medical management and followup in 1 year.  Pt is on Plavix due to ASA allergy.  Cardiac clearance per telephone encounter 07/18/20, "Chart reviewed as part of pre-operative protocol coverage. Given past medical history and time since last visit, based on ACC/AHA guidelines, AVIENDHA AZBELL would be at acceptable risk for the planned procedure without further cardiovascular testing. Her Plavix may be held for 5 to 7 days prior to the procedure.  Please resume as soon as hemostasis is achieved."  Pt reported LD Plavix 03/04/21.  Pt has been participating in healthy weight and wellness program. Per last followup 03/04/21 pt has lost 59lbs in preparation for knee replacement.  History of very large hiatal hernia. Per CT Chest 01/14/2018, "2. Very large hiatal hernia containing the entire stomach and a significant portion of the colon." She was evaluated by surgeon at [facility] 03/30/18, but given that she was reportedly completely asymptomatic, continued observation was recommended.  History of large multinodular goiter, stable since 2009 per ultrasound 03/23/16.  History of mild leukocytosis, evaluated by hematology, workup benign.   DMII, A1c 5.8 on preop labs.  Proep labs reviewed, unremarkable.  EKG 05/08/20: Sinus bradycardia. Rate 59.  TTE 01/17/18: - Left ventricle: The cavity size was normal. Wall thickness was     normal. Systolic function was normal. The estimated ejection    fraction was in the range of 60% to 65%. Wall motion was normal;    there were no regional wall motion abnormalities. Doppler    parameters are consistent with abnormal left ventricular    relaxation (grade 1 diastolic dysfunction).  - Aortic valve: There was no stenosis.  - Mitral valve: Mildly calcified annulus. There was no significant    regurgitation.  - Left atrium: The atrium was mildly dilated.  - Right ventricle: The cavity size was normal. Systolic function    was normal.  - Tricuspid valve: Peak RV-RA gradient (S): 40 mm Hg.  - Pulmonary arteries: PA peak pressure: 43 mm Hg (S).  - Inferior vena cava: The vessel was normal in size. The    respirophasic diameter changes were in the normal range (= 50%),    consistent with normal central venous pressure.   Impressions:   - Normal LV size with EF 60-65%. Normal RV size and systolic    function. Mild pulmonary hypertension.    Wynonia Musty [facility] Short Stay Center/Anesthesiology Phone [phone] 03/10/2021 1:03 PM

## 2021-03-10 NOTE — Anesthesia Preprocedure Evaluation (Addendum)
Anesthesia Evaluation  Patient identified by MRN, date of birth, ID band Patient awake    Reviewed: Allergy & Precautions, NPO status , Patient's Chart, lab work & pertinent test results  Airway Mallampati: II  TM Distance: >3 FB Neck ROM: Full    Dental no notable dental hx.    Pulmonary asthma , sleep apnea and Continuous Positive Airway Pressure Ventilation , former smoker,    Pulmonary exam normal breath sounds clear to auscultation       Cardiovascular hypertension, + CAD  Normal cardiovascular exam Rhythm:Regular Rate:Normal  11/19 TTE Left ventricle: The cavity size was normal. Wall thickness was  normal. Systolic function was normal. The estimated ejection  fraction was in the range of 60% to 65%. Wall motion was normal;  there were no regional wall motion abnormalities. Doppler  parameters are consistent with abnormal left ventricular  relaxation (grade 1 diastolic dysfunction).    Neuro/Psych  Headaches, negative neurological ROS  negative psych ROS   GI/Hepatic Neg liver ROS, hiatal hernia, GERD  ,  Endo/Other  diabetes  Renal/GU negative Renal ROSLab Results      Component                Value               Date                      CREATININE               0.86                03/07/2021                BUN                      13                  03/07/2021                NA                       139                 03/07/2021                K                        3.6                 03/07/2021                  negative genitourinary   Musculoskeletal  (+) Arthritis , Severe scoliosis   Abdominal   Peds negative pediatric ROS (+)  Hematology negative hematology ROS (+) Lab Results      Component                Value               Date                      WBC                      3.6 (L)             03/07/2021  HGB                      13.0                03/07/2021                 HCT                      41.1                03/07/2021                 PLT                      189                 03/07/2021              Anesthesia Other Findings ALL: Lisinopril, Metoprolol, Penicillins, Prednisone  Reproductive/Obstetrics negative OB ROS                          Anesthesia Physical Anesthesia Plan  ASA: 3  Anesthesia Plan: Spinal   Post-op Pain Management: Dilaudid IV, Minimal or no pain anticipated, Ofirmev IV (intra-op) and Regional block   Induction: Intravenous  PONV Risk Score and Plan: 3 and Treatment may vary due to age or medical condition, Ondansetron and Midazolam  Airway Management Planned: Simple Face Mask  Additional Equipment: None  Intra-op Plan:   Post-operative Plan:   Informed Consent: I have reviewed the patients History and Physical, chart, labs and discussed the procedure including the risks, benefits and alternatives for the proposed anesthesia with the patient or authorized representative who has indicated his/her understanding and acceptance.     Dental advisory given  Plan Discussed with: CRNA and Surgeon  Anesthesia Plan Comments: (Sp w L TKA  PAT note by Karoline Caldwell, PA-C:  Follows with cardiology for hx of CAD by coronary calcium on CT chest, OSA on O2 and Bipap, and HTN. She had a workup for exertional dyspnea in 6/14 by Dr Chase Caller. PFTs were restrictive with normal DLCO, suggesting abnormality was from body habitus. CT chest showed coronary calcification. She had an echo showed normal EF and a Lexiscan Cardiolite in 8/14 that was normal (Per cardiology note). Echo 12/2017 showedEF 60-65%, normal RV, PASP 43 mmHg. Pt last seen by Dr. Aundra Dubin 05/08/20. Stable at that time, primary complaint was knee pain. Recommended continue medical management and followup in 1 year.  Pt is on Plavix due to ASA allergy.  Cardiac clearance per telephone encounter 07/18/20, "Chart reviewed as part of  pre-operative protocol coverage. Given past medical history and time since last visit, based on ACC/AHA guidelines,Heather G Teasdalewould be at acceptable risk for the planned procedure without further cardiovascular testing. Her Plavix may be held for 5 to 7 days prior to the procedure. Please resume as soon as hemostasis is achieved."  Pt reported LD Plavix 03/04/21.  Pt has been participating in healthy weight and wellness program. Per last followup 03/04/21 pt has lost 59lbs in preparation for knee replacement.  History of very large hiatal hernia. Per CT Chest 01/14/2018, "2. Very large hiatal hernia containing the entire stomach and a significant portion of the colon." She was evaluated by surgeon at [facility] 03/30/18, but given that she was reportedly completely asymptomatic, continued observation was recommended.  History of large  multinodular goiter, stable since 2009 per ultrasound 03/23/16.  History of mild leukocytosis, evaluated by hematology, workup benign.   DMII, A1c 5.8 on preop labs.  Proep labs reviewed, unremarkable.  EKG 05/08/20: Sinus bradycardia. Rate 59.  TTE 01/17/18: - Left ventricle: The cavity size was normal. Wall thickness was  normal. Systolic function was normal. The estimated ejection  fraction was in the range of 60% to 65%. Wall motion was normal;  there were no regional wall motion abnormalities. Doppler  parameters are consistent with abnormal left ventricular  relaxation (grade 1 diastolic dysfunction).  - Aortic valve: There was no stenosis.  - Mitral valve: Mildly calcified annulus. There was no significant  regurgitation.  - Left atrium: The atrium was mildly dilated.  - Right ventricle: The cavity size was normal. Systolic function  was normal.  - Tricuspid valve: Peak RV-RA gradient (S): 40 mm Hg.  - Pulmonary arteries: PA peak pressure: 43 mm Hg (S).  - Inferior vena cava: The vessel was normal in size. The  respirophasic diameter  changes were in the normal range (= 50%),  consistent with normal central venous pressure.   Impressions:   - Normal LV size with EF 60-65%. Normal RV size and systolic  function. Mild pulmonary hypertension.   )     Anesthesia Quick Evaluation

## 2021-03-10 NOTE — Care Plan (Signed)
[place] office RNCM call to patient prior to her upcoming surgery to discuss her Left total knee arthroplasty with Dr. Ninfa Linden on 03/11/21.She is an Ortho bundle patient through [facility] and is agreeable to case management. She lives with her husband, who will be assisting after surgery. She has a rollator, but will need a RW and 3in1/BSC. These will be ordered through [place]. Anticipate HHPT will be needed after a short hospital stay. Referral made to [facility] after choice provided. Reviewed all post op care instructions with patient and her husband. Answered all questions. Will provide written handout at hospital after surgery. Will continue to follow for needs.

## 2021-03-10 NOTE — Telephone Encounter (Signed)
Ortho bundle pre-op call completed. 

## 2021-03-11 ENCOUNTER — Inpatient Hospital Stay (HOSPITAL_COMMUNITY)
Admission: RE | Admit: 2021-03-11 | Discharge: 2021-03-13 | DRG: 470 | Disposition: A | Discharge status: Home Health | Payer: Medicare Other | Attending: Orthopaedic Surgery | Admitting: Orthopaedic Surgery

## 2021-03-11 ENCOUNTER — Encounter (HOSPITAL_COMMUNITY): Payer: Self-pay | Admitting: Orthopaedic Surgery

## 2021-03-11 ENCOUNTER — Other Ambulatory Visit: Payer: Self-pay

## 2021-03-11 ENCOUNTER — Encounter (HOSPITAL_COMMUNITY)
Admission: RE | Disposition: A | Discharge status: Home Health | Payer: Self-pay | Source: Home / Self Care | Attending: Orthopaedic Surgery

## 2021-03-11 ENCOUNTER — Ambulatory Visit (HOSPITAL_COMMUNITY): Payer: Medicare Other | Admitting: Physician Assistant

## 2021-03-11 ENCOUNTER — Ambulatory Visit (HOSPITAL_COMMUNITY): Payer: Medicare Other | Admitting: Anesthesiology

## 2021-03-11 ENCOUNTER — Observation Stay (HOSPITAL_COMMUNITY): Payer: Medicare Other

## 2021-03-11 DIAGNOSIS — M1712 Unilateral primary osteoarthritis, left knee: Secondary | ICD-10-CM

## 2021-03-11 DIAGNOSIS — Z87891 Personal history of nicotine dependence: Secondary | ICD-10-CM | POA: Diagnosis not present

## 2021-03-11 DIAGNOSIS — G8918 Other acute postprocedural pain: Secondary | ICD-10-CM | POA: Diagnosis not present

## 2021-03-11 DIAGNOSIS — I10 Essential (primary) hypertension: Secondary | ICD-10-CM | POA: Diagnosis present

## 2021-03-11 DIAGNOSIS — M170 Bilateral primary osteoarthritis of knee: Secondary | ICD-10-CM | POA: Diagnosis not present

## 2021-03-11 DIAGNOSIS — Z818 Family history of other mental and behavioral disorders: Secondary | ICD-10-CM

## 2021-03-11 DIAGNOSIS — Z825 Family history of asthma and other chronic lower respiratory diseases: Secondary | ICD-10-CM

## 2021-03-11 DIAGNOSIS — Z8249 Family history of ischemic heart disease and other diseases of the circulatory system: Secondary | ICD-10-CM

## 2021-03-11 DIAGNOSIS — I2510 Atherosclerotic heart disease of native coronary artery without angina pectoris: Secondary | ICD-10-CM | POA: Diagnosis present

## 2021-03-11 DIAGNOSIS — Z803 Family history of malignant neoplasm of breast: Secondary | ICD-10-CM

## 2021-03-11 DIAGNOSIS — Z91010 Allergy to peanuts: Secondary | ICD-10-CM | POA: Diagnosis not present

## 2021-03-11 DIAGNOSIS — R7303 Prediabetes: Secondary | ICD-10-CM | POA: Diagnosis present

## 2021-03-11 DIAGNOSIS — Z800 Family history of malignant neoplasm of digestive organs: Secondary | ICD-10-CM | POA: Diagnosis not present

## 2021-03-11 DIAGNOSIS — Z96652 Presence of left artificial knee joint: Secondary | ICD-10-CM

## 2021-03-11 DIAGNOSIS — Z880 Allergy status to penicillin: Secondary | ICD-10-CM | POA: Diagnosis not present

## 2021-03-11 DIAGNOSIS — E1169 Type 2 diabetes mellitus with other specified complication: Secondary | ICD-10-CM | POA: Diagnosis present

## 2021-03-11 DIAGNOSIS — H9190 Unspecified hearing loss, unspecified ear: Secondary | ICD-10-CM | POA: Diagnosis present

## 2021-03-11 DIAGNOSIS — E7800 Pure hypercholesterolemia, unspecified: Secondary | ICD-10-CM | POA: Diagnosis not present

## 2021-03-11 DIAGNOSIS — Z8261 Family history of arthritis: Secondary | ICD-10-CM

## 2021-03-11 DIAGNOSIS — Z79899 Other long term (current) drug therapy: Secondary | ICD-10-CM

## 2021-03-11 DIAGNOSIS — Z91018 Allergy to other foods: Secondary | ICD-10-CM | POA: Diagnosis not present

## 2021-03-11 DIAGNOSIS — G4733 Obstructive sleep apnea (adult) (pediatric): Secondary | ICD-10-CM | POA: Diagnosis present

## 2021-03-11 DIAGNOSIS — Z888 Allergy status to other drugs, medicaments and biological substances status: Secondary | ICD-10-CM | POA: Diagnosis not present

## 2021-03-11 DIAGNOSIS — M25762 Osteophyte, left knee: Secondary | ICD-10-CM | POA: Diagnosis not present

## 2021-03-11 HISTORY — DX: Prediabetes: R73.03

## 2021-03-11 HISTORY — PX: TOTAL KNEE ARTHROPLASTY: SHX125

## 2021-03-11 LAB — GLUCOSE, CAPILLARY: Glucose-Capillary: 81 mg/dL (ref 70–99)

## 2021-03-11 LAB — ABO/RH: ABO/RH(D): B POS

## 2021-03-11 SURGERY — ARTHROPLASTY, KNEE, TOTAL
Anesthesia: Spinal | Site: Knee | Laterality: Left

## 2021-03-11 MED ORDER — VITAMIN B-12 1000 MCG PO TABS
1000.0000 ug | ORAL_TABLET | Freq: Every day | ORAL | Status: DC
  Administered 2021-03-11 – 2021-03-12 (×2): 1000 ug via ORAL
  Filled 2021-03-11 (×2): qty 1

## 2021-03-11 MED ORDER — EZETIMIBE 10 MG PO TABS
10.0000 mg | ORAL_TABLET | Freq: Every day | ORAL | Status: DC
Start: 2021-03-11 — End: 2021-03-13
  Administered 2021-03-12: 10 mg via ORAL
  Filled 2021-03-11 (×3): qty 1

## 2021-03-11 MED ORDER — HYDROMORPHONE HCL 1 MG/ML IJ SOLN: 0.5000 mg | INTRAMUSCULAR | Status: DC | PRN

## 2021-03-11 MED ORDER — POVIDONE-IODINE 10 % EX SWAB
2.0000 "application " | Freq: Once | CUTANEOUS | Status: AC
  Administered 2021-03-11: 2 via TOPICAL

## 2021-03-11 MED ORDER — PROPOFOL 500 MG/50ML IV EMUL: INTRAVENOUS | Status: DC | PRN

## 2021-03-11 MED ORDER — METOCLOPRAMIDE HCL 5 MG PO TABS: 5.0000 mg | ORAL_TABLET | Freq: Three times a day (TID) | ORAL | Status: DC | PRN

## 2021-03-11 MED ORDER — FENTANYL CITRATE (PF) 100 MCG/2ML IJ SOLN: 50.0000 ug | Freq: Once | INTRAMUSCULAR | Status: AC

## 2021-03-11 MED ORDER — ALUM & MAG HYDROXIDE-SIMETH 200-200-20 MG/5ML PO SUSP: 30.0000 mL | ORAL | Status: DC | PRN

## 2021-03-11 MED ORDER — DEXAMETHASONE SODIUM PHOSPHATE 10 MG/ML IJ SOLN
INTRAMUSCULAR | Status: AC
  Filled 2021-03-11: qty 1

## 2021-03-11 MED ORDER — LIDOCAINE 2% (20 MG/ML) 5 ML SYRINGE
INTRAMUSCULAR | Status: AC
  Filled 2021-03-11: qty 5

## 2021-03-11 MED ORDER — OXYCODONE HCL 5 MG PO TABS: 5.0000 mg | ORAL_TABLET | Freq: Once | ORAL | Status: DC | PRN

## 2021-03-11 MED ORDER — FUROSEMIDE 40 MG PO TABS
40.0000 mg | ORAL_TABLET | Freq: Every day | ORAL | Status: DC
  Administered 2021-03-12: 40 mg via ORAL
  Filled 2021-03-11: qty 1

## 2021-03-11 MED ORDER — PROPOFOL 10 MG/ML IV BOLUS
INTRAVENOUS | Status: AC
  Filled 2021-03-11: qty 20

## 2021-03-11 MED ORDER — ONDANSETRON HCL 4 MG PO TABS: 4.0000 mg | ORAL_TABLET | Freq: Four times a day (QID) | ORAL | Status: DC | PRN

## 2021-03-11 MED ORDER — LIDOCAINE HCL (CARDIAC) PF 100 MG/5ML IV SOSY
PREFILLED_SYRINGE | INTRAVENOUS | Status: DC | PRN
Start: 2021-03-11 — End: 2021-03-11
  Administered 2021-03-11: 100 mg via INTRATRACHEAL

## 2021-03-11 MED ORDER — FENTANYL CITRATE (PF) 250 MCG/5ML IJ SOLN
INTRAMUSCULAR | Status: AC
  Filled 2021-03-11: qty 5

## 2021-03-11 MED ORDER — ONDANSETRON HCL 4 MG/2ML IJ SOLN: 4.0000 mg | Freq: Once | INTRAMUSCULAR | Status: DC | PRN

## 2021-03-11 MED ORDER — DARIFENACIN HYDROBROMIDE ER 15 MG PO TB24
15.0000 mg | ORAL_TABLET | Freq: Every day | ORAL | Status: DC
  Administered 2021-03-11 – 2021-03-12 (×2): 15 mg via ORAL
  Filled 2021-03-11 (×3): qty 1

## 2021-03-11 MED ORDER — POTASSIUM CHLORIDE CRYS ER 20 MEQ PO TBCR
20.0000 meq | EXTENDED_RELEASE_TABLET | Freq: Every day | ORAL | Status: DC
  Administered 2021-03-11 – 2021-03-12 (×2): 20 meq via ORAL
  Filled 2021-03-11 (×2): qty 1

## 2021-03-11 MED ORDER — OXYCODONE HCL 5 MG PO TABS
10.0000 mg | ORAL_TABLET | ORAL | Status: DC | PRN
  Filled 2021-03-11: qty 2

## 2021-03-11 MED ORDER — ONDANSETRON HCL 4 MG/2ML IJ SOLN
INTRAMUSCULAR | Status: DC | PRN
  Administered 2021-03-11: 4 mg via INTRAVENOUS

## 2021-03-11 MED ORDER — ADULT MULTIVITAMIN W/MINERALS CH
1.0000 | ORAL_TABLET | Freq: Every day | ORAL | Status: DC
  Administered 2021-03-12: 1 via ORAL
  Filled 2021-03-11: qty 1

## 2021-03-11 MED ORDER — TRANEXAMIC ACID-NACL 1000-0.7 MG/100ML-% IV SOLN
INTRAVENOUS | Status: AC
  Filled 2021-03-11: qty 100

## 2021-03-11 MED ORDER — GABAPENTIN 300 MG PO CAPS
900.0000 mg | ORAL_CAPSULE | Freq: Every day | ORAL | Status: DC
  Administered 2021-03-11 – 2021-03-12 (×2): 900 mg via ORAL
  Filled 2021-03-11 (×2): qty 3

## 2021-03-11 MED ORDER — HYDROMORPHONE HCL 1 MG/ML IJ SOLN
0.2500 mg | INTRAMUSCULAR | Status: DC | PRN
  Administered 2021-03-11 (×4): 0.25 mg via INTRAVENOUS

## 2021-03-11 MED ORDER — ROSUVASTATIN CALCIUM 20 MG PO TABS
40.0000 mg | ORAL_TABLET | Freq: Every day | ORAL | Status: DC
  Administered 2021-03-11 – 2021-03-12 (×2): 40 mg via ORAL
  Filled 2021-03-11 (×2): qty 2

## 2021-03-11 MED ORDER — ONDANSETRON HCL 4 MG/2ML IJ SOLN
INTRAMUSCULAR | Status: AC
  Filled 2021-03-11: qty 2

## 2021-03-11 MED ORDER — PROPOFOL 10 MG/ML IV BOLUS
INTRAVENOUS | Status: DC | PRN
  Administered 2021-03-11: 20 mg via INTRAVENOUS
  Administered 2021-03-11: 180 mg via INTRAVENOUS

## 2021-03-11 MED ORDER — TRANEXAMIC ACID-NACL 1000-0.7 MG/100ML-% IV SOLN
1000.0000 mg | INTRAVENOUS | Status: AC
  Administered 2021-03-11: 1000 mg via INTRAVENOUS

## 2021-03-11 MED ORDER — SODIUM CHLORIDE 0.9 % IR SOLN
Status: DC | PRN
  Administered 2021-03-11: 1000 mL

## 2021-03-11 MED ORDER — SODIUM CHLORIDE 0.9 % IV SOLN: INTRAVENOUS | Status: DC

## 2021-03-11 MED ORDER — 0.9 % SODIUM CHLORIDE (POUR BTL) OPTIME
TOPICAL | Status: DC | PRN
  Administered 2021-03-11: 1000 mL

## 2021-03-11 MED ORDER — METHOCARBAMOL 500 MG PO TABS
500.0000 mg | ORAL_TABLET | Freq: Four times a day (QID) | ORAL | Status: DC | PRN
  Administered 2021-03-11 – 2021-03-13 (×5): 500 mg via ORAL
  Filled 2021-03-11 (×5): qty 1

## 2021-03-11 MED ORDER — ROCURONIUM BROMIDE 10 MG/ML (PF) SYRINGE
PREFILLED_SYRINGE | INTRAVENOUS | Status: AC
  Filled 2021-03-11: qty 10

## 2021-03-11 MED ORDER — FENTANYL CITRATE (PF) 100 MCG/2ML IJ SOLN
INTRAMUSCULAR | Status: AC
  Administered 2021-03-11: 50 ug via INTRAVENOUS
  Filled 2021-03-11: qty 2

## 2021-03-11 MED ORDER — CLINDAMYCIN PHOSPHATE 600 MG/50ML IV SOLN
600.0000 mg | Freq: Four times a day (QID) | INTRAVENOUS | Status: AC
  Administered 2021-03-11 (×2): 600 mg via INTRAVENOUS
  Filled 2021-03-11 (×2): qty 50

## 2021-03-11 MED ORDER — METHOCARBAMOL 1000 MG/10ML IJ SOLN
500.0000 mg | Freq: Four times a day (QID) | INTRAVENOUS | Status: DC | PRN
  Filled 2021-03-11: qty 5

## 2021-03-11 MED ORDER — ROPIVACAINE HCL 5 MG/ML IJ SOLN
INTRAMUSCULAR | Status: DC | PRN
Start: 2021-03-11 — End: 2021-03-11
  Administered 2021-03-11: 20 mL via PERINEURAL

## 2021-03-11 MED ORDER — CLINDAMYCIN PHOSPHATE 900 MG/50ML IV SOLN
900.0000 mg | INTRAVENOUS | Status: AC
  Administered 2021-03-11: 900 mg via INTRAVENOUS

## 2021-03-11 MED ORDER — HYDROMORPHONE HCL 1 MG/ML IJ SOLN
INTRAMUSCULAR | Status: AC
  Filled 2021-03-11: qty 1

## 2021-03-11 MED ORDER — TOPIRAMATE 25 MG PO TABS
75.0000 mg | ORAL_TABLET | Freq: Every day | ORAL | Status: DC
  Administered 2021-03-11 – 2021-03-12 (×2): 75 mg via ORAL
  Filled 2021-03-11 (×2): qty 3

## 2021-03-11 MED ORDER — FENTANYL CITRATE (PF) 250 MCG/5ML IJ SOLN
INTRAMUSCULAR | Status: DC | PRN
Start: 2021-03-11 — End: 2021-03-11
  Administered 2021-03-11: 100 ug via INTRAVENOUS
  Administered 2021-03-11: 50 ug via INTRAVENOUS

## 2021-03-11 MED ORDER — MENTHOL 3 MG MT LOZG: 1.0000 | LOZENGE | OROMUCOSAL | Status: DC | PRN

## 2021-03-11 MED ORDER — FERROUS SULFATE 325 (65 FE) MG PO TABS
325.0000 mg | ORAL_TABLET | Freq: Every day | ORAL | Status: DC
  Administered 2021-03-11 – 2021-03-12 (×2): 325 mg via ORAL
  Filled 2021-03-11 (×2): qty 1

## 2021-03-11 MED ORDER — ORAL CARE MOUTH RINSE: 15.0000 mL | Freq: Once | OROMUCOSAL | Status: AC

## 2021-03-11 MED ORDER — MIDAZOLAM HCL 2 MG/2ML IJ SOLN
INTRAMUSCULAR | Status: AC
  Administered 2021-03-11: 1 mg via INTRAVENOUS
  Filled 2021-03-11: qty 2

## 2021-03-11 MED ORDER — VITAMIN B-6 100 MG PO TABS
200.0000 mg | ORAL_TABLET | Freq: Every day | ORAL | Status: DC
  Administered 2021-03-12: 200 mg via ORAL
  Filled 2021-03-11 (×3): qty 2

## 2021-03-11 MED ORDER — METOCLOPRAMIDE HCL 5 MG/ML IJ SOLN: 5.0000 mg | Freq: Three times a day (TID) | INTRAMUSCULAR | Status: DC | PRN

## 2021-03-11 MED ORDER — OXYCODONE HCL 5 MG PO TABS
5.0000 mg | ORAL_TABLET | ORAL | Status: DC | PRN
  Administered 2021-03-11 – 2021-03-12 (×6): 10 mg via ORAL
  Administered 2021-03-13: 5 mg via ORAL
  Administered 2021-03-13: 10 mg via ORAL
  Filled 2021-03-11 (×4): qty 2
  Filled 2021-03-11: qty 1
  Filled 2021-03-11 (×2): qty 2

## 2021-03-11 MED ORDER — ACETAMINOPHEN 325 MG PO TABS
325.0000 mg | ORAL_TABLET | Freq: Four times a day (QID) | ORAL | Status: DC | PRN
  Administered 2021-03-12 – 2021-03-13 (×3): 650 mg via ORAL
  Filled 2021-03-11 (×3): qty 2

## 2021-03-11 MED ORDER — MIDAZOLAM HCL 2 MG/2ML IJ SOLN: 1.0000 mg | Freq: Once | INTRAMUSCULAR | Status: AC

## 2021-03-11 MED ORDER — CHLORHEXIDINE GLUCONATE 0.12 % MT SOLN
OROMUCOSAL | Status: AC
  Administered 2021-03-11: 15 mL via OROMUCOSAL
  Filled 2021-03-11: qty 15

## 2021-03-11 MED ORDER — CLOPIDOGREL BISULFATE 75 MG PO TABS
75.0000 mg | ORAL_TABLET | Freq: Every day | ORAL | Status: DC
  Administered 2021-03-12: 75 mg via ORAL
  Filled 2021-03-11: qty 1

## 2021-03-11 MED ORDER — LACTATED RINGERS IV SOLN: INTRAVENOUS | Status: DC

## 2021-03-11 MED ORDER — PHENOL 1.4 % MT LIQD: 1.0000 | OROMUCOSAL | Status: DC | PRN

## 2021-03-11 MED ORDER — LACTATED RINGERS IV SOLN: INTRAVENOUS | Status: DC | PRN

## 2021-03-11 MED ORDER — OXYCODONE HCL 5 MG/5ML PO SOLN: 5.0000 mg | Freq: Once | ORAL | Status: DC | PRN

## 2021-03-11 MED ORDER — DOCUSATE SODIUM 100 MG PO CAPS
100.0000 mg | ORAL_CAPSULE | Freq: Two times a day (BID) | ORAL | Status: DC
  Administered 2021-03-11 – 2021-03-12 (×3): 100 mg via ORAL
  Filled 2021-03-11 (×3): qty 1

## 2021-03-11 MED ORDER — VITAMIN E 45 MG (100 UNIT) PO CAPS
200.0000 [IU] | ORAL_CAPSULE | Freq: Every day | ORAL | Status: DC
  Administered 2021-03-12: 200 [IU] via ORAL
  Filled 2021-03-11 (×3): qty 2

## 2021-03-11 MED ORDER — CLINDAMYCIN PHOSPHATE 900 MG/50ML IV SOLN
INTRAVENOUS | Status: AC
  Filled 2021-03-11: qty 50

## 2021-03-11 MED ORDER — CHLORHEXIDINE GLUCONATE 0.12 % MT SOLN: 15.0000 mL | Freq: Once | OROMUCOSAL | Status: AC

## 2021-03-11 MED ORDER — ONDANSETRON HCL 4 MG/2ML IJ SOLN: 4.0000 mg | Freq: Four times a day (QID) | INTRAMUSCULAR | Status: DC | PRN

## 2021-03-11 MED ORDER — DEXAMETHASONE SODIUM PHOSPHATE 10 MG/ML IJ SOLN
INTRAMUSCULAR | Status: DC | PRN
Start: 2021-03-11 — End: 2021-03-11
  Administered 2021-03-11: 10 mg via INTRAVENOUS

## 2021-03-11 MED ORDER — PANTOPRAZOLE SODIUM 20 MG PO TBEC
20.0000 mg | DELAYED_RELEASE_TABLET | Freq: Two times a day (BID) | ORAL | Status: DC
  Administered 2021-03-11 – 2021-03-13 (×4): 20 mg via ORAL
  Filled 2021-03-11 (×4): qty 1

## 2021-03-11 MED ORDER — ROCURONIUM 10MG/ML (10ML) SYRINGE FOR MEDFUSION PUMP - OPTIME
INTRAVENOUS | Status: DC | PRN
  Administered 2021-03-11: 100 mg via INTRAVENOUS

## 2021-03-11 MED ORDER — DIPHENHYDRAMINE HCL 12.5 MG/5ML PO ELIX
12.5000 mg | ORAL_SOLUTION | ORAL | Status: DC | PRN
  Filled 2021-03-11: qty 10

## 2021-03-11 MED ORDER — SUGAMMADEX SODIUM 200 MG/2ML IV SOLN
INTRAVENOUS | Status: DC | PRN
Start: 2021-03-11 — End: 2021-03-11
  Administered 2021-03-11: 200 mg via INTRAVENOUS

## 2021-03-11 MED ORDER — POLYETHYLENE GLYCOL 3350 17 G PO PACK: 17.0000 g | PACK | Freq: Every day | ORAL | Status: DC | PRN

## 2021-03-11 SURGICAL SUPPLY — 77 items
BAG COUNTER SPONGE SURGICOUNT (BAG) ×3 IMPLANT
BAG SPNG CNTER NS LX DISP (BAG) ×1
BANDAGE ESMARK 6X9 LF (GAUZE/BANDAGES/DRESSINGS) ×2 IMPLANT
BIT DRILL QUICK REL 1/8 2PK SL (DRILL) IMPLANT
BLADE SAG 18X100X1.27 (BLADE) ×2 IMPLANT
BNDG CMPR 9X6 STRL LF SNTH (GAUZE/BANDAGES/DRESSINGS) ×1
BNDG ELASTIC 4X5.8 VLCR STR LF (GAUZE/BANDAGES/DRESSINGS) ×1 IMPLANT
BNDG ELASTIC 6X5.8 VLCR STR LF (GAUZE/BANDAGES/DRESSINGS) ×5 IMPLANT
BNDG ESMARK 6X9 LF (GAUZE/BANDAGES/DRESSINGS) ×2
BOWL SMART MIX CTS (DISPOSABLE) ×2 IMPLANT
BSPLAT TIB 5D D CMNT STM LT (Knees) ×1 IMPLANT
CEMENT BONE R 1X40 (Cement) ×2 IMPLANT
COMP FEM CEMT PERSONA STD SZ7 (Knees) ×2 IMPLANT
COMP MED POLY AS PERS S6-7 12 (Joint) ×2 IMPLANT
COMPONENT FEM CMT PRSN STD SZ7 (Knees) IMPLANT
COMPONENT MED PLY PERSS6-7 12 (Joint) IMPLANT
COVER SURGICAL LIGHT HANDLE (MISCELLANEOUS) ×2 IMPLANT
CUFF TOURN SGL QUICK 34 (TOURNIQUET CUFF) ×2
CUFF TRNQT CYL 34X4.125X (TOURNIQUET CUFF) ×1 IMPLANT
DRAPE EXTREMITY T 121X128X90 (DISPOSABLE) ×3 IMPLANT
DRAPE HALF SHEET 40X57 (DRAPES) ×2 IMPLANT
DRAPE U-SHAPE 47X51 STRL (DRAPES) ×2 IMPLANT
DRILL QUICK RELEASE 1/8 INCH (DRILL) ×2
DRSG PAD ABDOMINAL 8X10 ST (GAUZE/BANDAGES/DRESSINGS) ×3 IMPLANT
DURAPREP 26ML APPLICATOR (WOUND CARE) ×3 IMPLANT
ELECT CAUTERY BLADE 6.4 (BLADE) ×2 IMPLANT
ELECT REM PT RETURN 9FT ADLT (ELECTROSURGICAL) ×2
ELECTRODE REM PT RTRN 9FT ADLT (ELECTROSURGICAL) ×1 IMPLANT
FACESHIELD WRAPAROUND (MASK) ×4 IMPLANT
FACESHIELD WRAPAROUND OR TEAM (MASK) ×2 IMPLANT
GAUZE SPONGE 4X4 12PLY STRL (GAUZE/BANDAGES/DRESSINGS) ×3 IMPLANT
GAUZE XEROFORM 1X8 LF (GAUZE/BANDAGES/DRESSINGS) ×2 IMPLANT
GLOVE SRG 8 PF TXTR STRL LF DI (GLOVE) ×2 IMPLANT
GLOVE SURG ORTHO LTX SZ7.5 (GLOVE) ×2 IMPLANT
GLOVE SURG ORTHO LTX SZ8 (GLOVE) ×2 IMPLANT
GLOVE SURG UNDER POLY LF SZ8 (GLOVE) ×4
GOWN STRL REUS W/ TWL LRG LVL3 (GOWN DISPOSABLE) IMPLANT
GOWN STRL REUS W/ TWL XL LVL3 (GOWN DISPOSABLE) ×2 IMPLANT
GOWN STRL REUS W/TWL LRG LVL3 (GOWN DISPOSABLE)
GOWN STRL REUS W/TWL XL LVL3 (GOWN DISPOSABLE) ×4
HANDPIECE INTERPULSE COAX TIP (DISPOSABLE) ×2
HDLS TROCR DRIL PIN KNEE 75 (PIN) ×2
IMMOBILIZER KNEE 22 UNIV (SOFTGOODS) ×3 IMPLANT
KIT BASIN OR (CUSTOM PROCEDURE TRAY) ×2 IMPLANT
KIT TURNOVER KIT B (KITS) ×2 IMPLANT
MANIFOLD NEPTUNE II (INSTRUMENTS) ×2 IMPLANT
NS IRRIG 1000ML POUR BTL (IV SOLUTION) ×2 IMPLANT
PACK TOTAL JOINT (CUSTOM PROCEDURE TRAY) ×2 IMPLANT
PAD ARMBOARD 7.5X6 YLW CONV (MISCELLANEOUS) ×2 IMPLANT
PAD CAST 4YDX4 CTTN HI CHSV (CAST SUPPLIES) IMPLANT
PADDING CAST COTTON 4X4 STRL (CAST SUPPLIES) ×4
PADDING CAST COTTON 6X4 STRL (CAST SUPPLIES) ×3 IMPLANT
PENCIL BUTTON HOLSTER BLD 10FT (ELECTRODE) ×1 IMPLANT
PIN DRILL HDLS TROCAR 75 4PK (PIN) IMPLANT
SCREW FEMALE HEX FIX 25X2.5 (ORTHOPEDIC DISPOSABLE SUPPLIES) ×2 IMPLANT
SET HNDPC FAN SPRY TIP SCT (DISPOSABLE) ×1 IMPLANT
SET PAD KNEE POSITIONER (MISCELLANEOUS) ×2 IMPLANT
SPONGE T-LAP 18X18 ~~LOC~~+RFID (SPONGE) ×4 IMPLANT
STAPLER VISISTAT 35W (STAPLE) ×1 IMPLANT
STEM POLY PAT PLY 29M KNEE (Knees) ×1 IMPLANT
STEM TIBIA 5 DEG SZ D L KNEE (Knees) IMPLANT
STRIP CLOSURE SKIN 1/2X4 (GAUZE/BANDAGES/DRESSINGS) IMPLANT
SUCTION FRAZIER HANDLE 10FR (MISCELLANEOUS) ×4
SUCTION TUBE FRAZIER 10FR DISP (MISCELLANEOUS) ×2 IMPLANT
SUT MNCRL AB 4-0 PS2 18 (SUTURE) IMPLANT
SUT VIC AB 0 CT1 27 (SUTURE) ×2
SUT VIC AB 0 CT1 27XBRD ANBCTR (SUTURE) ×1 IMPLANT
SUT VIC AB 1 CT1 27 (SUTURE) ×4
SUT VIC AB 1 CT1 27XBRD ANBCTR (SUTURE) ×2 IMPLANT
SUT VIC AB 2-0 CT1 27 (SUTURE) ×4
SUT VIC AB 2-0 CT1 TAPERPNT 27 (SUTURE) ×2 IMPLANT
SYR 50ML LL SCALE MARK (SYRINGE) IMPLANT
TIBIA STEM 5 DEG SZ D L KNEE (Knees) ×2 IMPLANT
TOWEL GREEN STERILE (TOWEL DISPOSABLE) ×2 IMPLANT
TOWEL GREEN STERILE FF (TOWEL DISPOSABLE) ×2 IMPLANT
TRAY CATH 16FR W/PLASTIC CATH (SET/KITS/TRAYS/PACK) IMPLANT
WRAP KNEE MAXI GEL POST OP (GAUZE/BANDAGES/DRESSINGS) ×2 IMPLANT

## 2021-03-11 NOTE — Anesthesia Postprocedure Evaluation (Signed)
Anesthesia Post Note  Patient: Heather [name]  Procedure(s) Performed: Left TOTAL KNEE ARTHROPLASTY (Left: Knee)     Patient location during evaluation: PACU Anesthesia Type: General Level of consciousness: awake and alert Pain management: pain level controlled Vital Signs Assessment: post-procedure vital signs reviewed and stable Respiratory status: spontaneous breathing, nonlabored ventilation, respiratory function stable and patient connected to nasal cannula oxygen Cardiovascular status: blood pressure returned to baseline and stable Postop Assessment: no apparent nausea or vomiting Anesthetic complications: no   No notable events documented.  Last Vitals:  Vitals:   03/11/21 1540 03/11/21 1555  BP: (!) 161/74 (!) 164/85  Pulse: 87 89  Resp: (!) 21 18  Temp:    SpO2: 99% 100%    Last Pain:  Vitals:   03/11/21 1555  TempSrc:   PainSc: Asleep                 Doyt Castellana S

## 2021-03-11 NOTE — Transfer of Care (Signed)
Immediate Anesthesia Transfer of Care Note  Patient: Heather [name]  Procedure(s) Performed: Left TOTAL KNEE ARTHROPLASTY (Left: Knee)  Patient Location: PACU  Anesthesia Type:GA combined with regional for post-op pain  Level of Consciousness: drowsy, patient cooperative and responds to stimulation  Airway & Oxygen Therapy: Patient Spontanous Breathing and Patient connected to nasal cannula oxygen  Post-op Assessment: Report given to RN, Post -op Vital signs reviewed and stable and Patient moving all extremities  Post vital signs: Reviewed and stable  Last Vitals:  Vitals Value Taken Time  BP 157/85 03/11/21 1356  Temp 36.7 C 03/11/21 1355  Pulse 76 03/11/21 1355  Resp 18 03/11/21 1400  SpO2    Vitals shown include unvalidated device data.  Last Pain:  Vitals:   03/11/21 1048  TempSrc:   PainSc: 0-No pain         Complications: No notable events documented.

## 2021-03-11 NOTE — Anesthesia Procedure Notes (Signed)
Anesthesia Regional Block: Adductor canal block   Pre-Anesthetic Checklist: , timeout performed,  Correct Patient, Correct Site, Correct Laterality,  Correct Procedure, Correct Position, site marked,  Risks and benefits discussed,  Surgical consent,  Pre-op evaluation,  At surgeon's request and post-op pain management  Laterality: Left  Prep: chloraprep       Needles:  Injection technique: Single-shot  Needle Type: Echogenic Needle     Needle Length: 9cm      Additional Needles:   Procedures:,,,, ultrasound used (permanent image in chart),,    Narrative:  Start time: 03/11/2021 11:20 AM End time: 03/11/2021 11:27 AM Injection made incrementally with aspirations every 5 mL.  Performed by: Personally  Anesthesiologist: Myrtie Soman, MD  Additional Notes: Patient tolerated the procedure well without complications

## 2021-03-11 NOTE — Anesthesia Procedure Notes (Signed)
Anesthesia Procedure Image    

## 2021-03-11 NOTE — Op Note (Signed)
Heather [name], Heather [name] MEDICAL RECORD NO: [id number] ACCOUNT NO: [id number] DATE OF BIRTH: [birth year] FACILITY: [facility] LOCATION: [facility] PHYSICIAN: Lind Guest. Ninfa Linden, MD  Operative Report   DATE OF PROCEDURE: 03/11/2021  PREOPERATIVE DIAGNOSIS:  Primary osteoarthritis and degenerative joint disease, left knee.  POSTOPERATIVE DIAGNOSIS:  Primary osteoarthritis and degenerative joint disease, left knee.  PROCEDURE:  Left total knee arthroplasty.  IMPLANTS:  Biomet/Zimmer Persona cemented knee system with size 7 left CR femur, size D left tibial tray, 12 mm medial congruent fixed bearing polyethylene insert, size 29 mm patellar button.  SURGEON:  Lind Guest. Ninfa Linden, MD  ASSISTANT:  Benita Stabile, PA-C  ANESTHESIA: 1.  Left lower extremity adductor canal block. 2.  Attempted spinal. 3.  General.  ANTIBIOTICS: 900 mg IV clindamycin.  BLOOD LOSS:  Less than 100 mL  TOURNIQUET TIME:  Less than 1 hour.  COMPLICATIONS:  None.  INDICATIONS:  The patient is a very pleasant 76 year old female well known to me.  She has debilitating arthritis involving both of her knees.  They both hurt on a daily basis and they are detrimentally affecting her mobility, her quality of life and her  activities of daily living to the point she does wish to proceed with total knee arthroplasty.  Her left knee hurts her the worst, so she wants this one addressed first.  I agree with this as well based on the failure of conservative treatment for over  a year combined with her x-ray findings and clinical exam findings.  We did talk about the risk of acute blood loss anemia, nerve or vessel injury, fracture, infection, DVT, implant failure, skin and soft tissue issues.  We talked about our goal is being  decrease pain, improve mobility and overall improve quality of life.  DESCRIPTION OF PROCEDURE:  After informed consent was obtained, appropriate left knee was marked.  She was brought to the operating room  after an adductor canal block was obtained of her left lower extremity in the holding room.  In the operating room,  she was sat up on the operating table and attempted spinal anesthesia, which was unsuccessful.  She was laid in supine position and general anesthesia was obtained. A nonsterile tourniquet was placed around her upper left thigh and left thigh, knee, leg,  ankle and foot were prepped and draped with DuraPrep and sterile drapes.  Timeout was called and she was identified as correct patient, correct left knee.  We then used the Esmarch to wrap that leg and tourniquet was inflated to 300 mmHg of pressure.   We then made a direct midline incision over the patella and carried this proximally and distally.  We dissected down the knee joint and carried out a medial parapatellar arthrotomy, finding a very large joint effusion.  With the knee in a flexed  position, we found osteophytes and cartilage wear in all three compartments.  We removed remnants of the ACL and medial and lateral meniscus as well as osteophytes in all three compartments.  We then used the extramedullary cutting guide for making our  proximal tibia cut, setting this for a 3 to 5 degree slope, correct her varus and valgus and taking 9 mm off the high side.  We made this cut without difficulty.  We then went to the femur and used the intramedullary cutting guide for our femur, using  this for a distal femoral cut of 10 mm for left knee at 5 degrees externally rotated.  We made that cut without difficulty  as well and then brought the knee back down to full extension and removed more remnants of meniscus from the back of knee and then  placed a 10 mm extension block and we had achieved full extension.  We then went back to the femur and put our femoral sizing guide based off the epicondylar axis and Whiteside's  line.  Based off this, we chose a size 7 femur.  We put a 4-in-1 cutting  block for a size 7 femur. We made our anterior and  posterior cuts, followed by our chamfer cuts.  We then went back to the tibia and chose a size left D tibial tray for coverage of the tibial plateau.  We set the rotation of the femur and tibial tubercle  and did our keel punch and drill hole off of this.  With a size D left trial tibia, we trialed a 7 left femur and then 10 mm medial congruent fixed bearing polyethylene insert.  We went up to 12 mm insert and we were pleased with range of motion and  stability of the 12 mm insert.  We then made our patellar cut and drilled three holes for a size 29 patellar button.  With all trial instrumentation in the knee, we put her through several cycles of motion.  We were pleased with range of motion and  stability.  We then removed all instrumentation from the knee and irrigated the knee with normal saline solution using pulsatile lavage.  We mixed our cement and with the knee in a flexed position cemented our Biomet Zimmer Persona size D left tibial  tray followed by cementing our size 7 left femur.  We placed our 12 mm medial congruent polyethylene insert for the size D tibial tray and cemented our patellar button.  We then held the knee compressed and fully extended to allow the cement to harden.   Once it had hardened, we let the tourniquet down and removed excess cement debris from the knee.  Hemostasis was obtained with electrocautery.  We then closed the arthrotomy with interrupted #1 Vicryl suture followed by 0 Vicryl to close deep tissue and  2-0 Vicryl for the subcutaneous tissue.  The skin was closed with staples.  A well-padded sterile dressing was applied.  She was awakened, extubated, and taken to recovery room in stable condition with all final counts being correct.  No complications  noted.  Of note, Benita Stabile, PA-C did assist in entire case.  His assistance was crucial for facilitating every aspect of this case.   VAI D: 03/11/2021 1:25:41 pm T: 03/11/2021 10:28:00 pm  JOB: [id number]/ [id number]

## 2021-03-11 NOTE — H&P (Signed)
TOTAL KNEE ADMISSION H&P  Patient is being admitted for left total knee arthroplasty.  Subjective:  Chief Complaint:left knee pain.  HPI: Heather [name], 76 y.o. female, has a history of pain and functional disability in the left knee due to arthritis and has failed non-surgical conservative treatments for greater than 12 weeks to includeNSAID's and/or analgesics, corticosteriod injections, viscosupplementation injections, flexibility and strengthening excercises, use of assistive devices, weight reduction as appropriate, and activity modification.  Onset of symptoms was gradual, starting 5 years ago with gradually worsening course since that time. The patient noted no past surgery on the left knee(s).  Patient currently rates pain in the left knee(s) at 10 out of 10 with activity. Patient has night pain, worsening of pain with activity and weight bearing, pain that interferes with activities of daily living, pain with passive range of motion, crepitus, and joint swelling.  Patient has evidence of subchondral sclerosis, periarticular osteophytes, and joint space narrowing by imaging studies. There is no active infection.  Patient Active Problem List   Diagnosis Date Noted   Chronic pain 01/06/2021   Unilateral primary osteoarthritis, left knee 12/25/2020   Unilateral primary osteoarthritis, right knee 12/25/2020   Lumbar radiculopathy 07/04/2020   Bilateral sciatica 07/04/2020   Pain in both hands 07/04/2020   Hyperlipidemia associated with type 2 diabetes mellitus ([place]) 05/01/2020   Abdominal discomfort 03/14/2020   Abnormal blood level of copper 03/14/2020   Leukopenia 03/14/2020   Obesity (BMI 30-39.9) 11/13/2019   Chronic pain of right thumb 10/11/2019   Prediabetes 09/06/2019   Vitamin D deficiency 07/27/2019   Memory loss 04/27/2019   Thyroid nodule 04/27/2019   Mass of left wrist 63/84/6659   Periumbilical hernia 93/57/0177   Mass of right thigh 09/13/2018   Depression with  anxiety 08/02/2018   Sleeps in sitting position due to orthopnea 07/13/2018   Supplemental oxygen dependent 07/13/2018   BiPAP (biphasic positive airway pressure) dependence 05/24/2018   Goiter 04/15/2017   Urinary incontinence 04/15/2017   Type 2 diabetes mellitus without complication, without long-term current use of insulin ([place]) 02/15/2017   Bilateral leg cramps 01/05/2017   High serum vitamin B12 01/05/2017   Peripheral neuropathy 05/27/2016   Screen for colon cancer 04/02/2016   Elevated sed rate 03/05/2016   RLS (restless legs syndrome) 02/12/2016   Muscle spasm of both lower legs 02/12/2016   Edema of both ankles 02/12/2016   Iron deficiency anemia 02/12/2016   Hypokalemia 02/12/2016   Pernicious anemia 02/12/2016   Degenerative disc disease, lumbar 04/09/2015   Degenerative arthritis of knee, bilateral 03/13/2015   Chronic low back pain 03/13/2015   Coronary atherosclerosis of native coronary artery 10/31/2013   Incidental lung nodule, > 34m and < 831m Left lower lobe 31m40m June 2014, April 2015 unchanged 06/25/2013   OSA on CPAP 06/21/2013   Paraesophageal hernia 06/20/2013   Myalgia and myositis 05/02/2013   Muscle ache 04/03/2013   Nocturnal hypoxemia 02/17/2013   Class 1 obesity with serious comorbidity and body mass index (BMI) of 34.0 to 34.9 in adult 01/24/2013   Large hiatal hernia 08/24/2012    LLL nodule 6mm81mJune 2014, described in 2003 07/13/2012   Asthma, chronic 06/29/2012   Arthralgia 06/02/2012   Unspecified asthma(493.90) 06/02/2012   Hypersomnia with sleep apnea, unspecified 03/11/2012   Other alteration of consciousness 03/11/2012   Migraine without aura 03/11/2012   Encounter for long-term (current) use of other medications 12/28/2011   SOB (shortness of breath) 12/28/2011  Screening examination for infectious disease 12/28/2011   Hx of fibroids 08/03/2011   Knee pain, left 12/25/2010   NECK PAIN 12/03/2009   EDEMA 12/18/2008   TINNITUS  11/22/2007   HEARING LOSS 11/22/2007   Chronic pain of both knees 11/22/2007   ASYMPTOMATIC POSTMENOPAUSAL STATUS 11/22/2007   GOITER, MULTINODULAR 07/20/2007   Gastroesophageal reflux disease without esophagitis 07/20/2007   NASH (nonalcoholic steatohepatitis) 07/20/2007   Headache 07/20/2007   HYPERCHOLESTEROLEMIA 01/13/2007   ANXIETY 10/22/2006   Essential hypertension 10/22/2006   Allergic rhinitis 10/22/2006   Osteoarthritis 10/22/2006   Past Medical History:  Diagnosis Date   Abdominal hernia    Abnormal Pap smear    ALLERGIC RHINITIS 10/22/2006   Allergy    ANEMIA 12/18/2008   Arthritis    scoliosis moderate deg changes lumbar Xray 11/04/17    Asthma    ASYMPTOMATIC POSTMENOPAUSAL STATUS 11/22/2007   Back pain    CAD (coronary artery disease)    in LAD   CHF (congestive heart failure) ([place])    Complication of anesthesia 2010   pt states she woke up during colonoscopy and was aware of "losing control of breathing" during mammoplasty   Degenerative arthritis    Diverticulosis    Dyslipidemia    Fibroid    Frequent headaches    GERD 07/20/2007   GOITER, MULTINODULAR 07/20/2007   Headache(784.0) 07/20/2007   HEARING LOSS 11/22/2007   Hiatal hernia    Hiatal hernia    large   History of chicken pox    Hx of colposcopy with cervical biopsy    HYPERCHOLESTEROLEMIA 01/13/2007   Hyperglycemia    HYPERTENSION 10/22/2006   Lung nodule    unchanged since 05/26/13    Migraines    NASH (nonalcoholic steatohepatitis)    Nocturnal hypoxemia 02/17/2013   Obesity    Obstructive sleep apnea    OSA on CPAP    per neurology   OSTEOARTHRITIS 10/22/2006   Other chronic nonalcoholic liver disease 25/85/2778   Pre-diabetes    Sedimentation rate elevation    Sleep apnea    on cpap   Urine incontinence    UTI (urinary tract infection)     Past Surgical History:  Procedure Laterality Date   BREAST SURGERY  1984   Breast reduction b/l    CATARACT EXTRACTION, BILATERAL      DEXA  08/2005   DILATION AND CURETTAGE OF UTERUS     ELECTROCARDIOGRAM  10/15/2006   ESOPHAGOGASTRODUODENOSCOPY  12/08/2005   FOOT SURGERY     hammertoe and bunion 05/2017    Stress Cardiolite  10/21/2005   sweat gland removal     WISDOM TOOTH EXTRACTION      Current Facility-Administered Medications  Medication Dose Route Frequency Provider Last Rate Last Admin   clindamycin (CLEOCIN) 900 MG/50ML IVPB            clindamycin (CLEOCIN) IVPB 900 mg  900 mg Intravenous On Call to OR Pete Pelt, PA-C       fentaNYL (SUBLIMAZE) 100 MCG/2ML injection            lactated ringers infusion   Intravenous Continuous Hodierne, Adam, MD 10 mL/hr at 03/11/21 1051 New Bag at 03/11/21 1051   midazolam (VERSED) 2 MG/2ML injection            tranexamic acid (CYKLOKAPRON) 1000MG/173m IVPB            tranexamic acid (CYKLOKAPRON) IVPB 1,000 mg  1,000 mg Intravenous To OR CPete Pelt  PA-C       Allergies  Allergen Reactions   Aspirin Hives   Coconut Oil Hives   Lisinopril Cough   Metoprolol Itching   Other     Latex paint, the smell causes hives. Not allergic to latex gloves   General anesthesia - has had issues in past where anesthesia was not properly administered and caused side effects   Peanut-Containing Drug Products Hives and Itching   Prednisone     Unknown reaction    Strawberry Extract Hives   Penicillins Rash    Social History   Tobacco Use   Smoking status: Former    Packs/day: 2.00    Years: 20.00    Pack years: 40.00    Types: Cigarettes    Quit date: 03/03/1979    Years since quitting: 42.0   Smokeless tobacco: Never  Substance Use Topics   Alcohol use: Yes    Comment: rare - TWICE A YR    Family History  Problem Relation Age of Onset   Asthma Mother    Depression Mother    Bipolar disorder Mother    Dementia Mother    Arthritis Mother    Breast cancer Mother        primary   Colon cancer Mother        mets from breast   Cancer Mother        colon    Hypertension Father    Migraines Father    Cancer Maternal Aunt        ?   Heart disease Maternal Grandmother    Cancer Maternal Grandfather        stomach ?   Sleep apnea Neg Hx      Review of Systems  Musculoskeletal:  Positive for gait problem and joint swelling.  All other systems reviewed and are negative.  Objective:  Physical Exam Vitals reviewed.  Constitutional:      Appearance: Normal appearance.  HENT:     Head: Normocephalic and atraumatic.  Eyes:     Extraocular Movements: Extraocular movements intact.     Pupils: Pupils are equal, round, and reactive to light.  Cardiovascular:     Rate and Rhythm: Normal rate.  Pulmonary:     Effort: Pulmonary effort is normal.  Abdominal:     Palpations: Abdomen is soft.  Musculoskeletal:     Cervical back: Normal range of motion and neck supple.     Left knee: Effusion, bony tenderness and crepitus present. Decreased range of motion. Tenderness present over the medial joint line, lateral joint line and patellar tendon. Abnormal alignment and abnormal meniscus.  Neurological:     Mental Status: She is alert and oriented to person, place, and time.  Psychiatric:        Behavior: Behavior normal.    Vital signs in last 24 hours: Temp:  [98.7 F (37.1 C)] 98.7 F (37.1 C) (01/10 1023) Pulse Rate:  [70] 70 (01/10 1023) Resp:  [18] 18 (01/10 1023) BP: (154)/(67) 154/67 (01/10 1023) SpO2:  [100 %] 100 % (01/10 1023) Weight:  [94.3 kg] 94.3 kg (01/10 1023)  Labs:   Estimated body mass index is 34.61 kg/m as calculated from the following:   Height as of this encounter: 5' 5"  (1.651 m).   Weight as of this encounter: 94.3 kg.   Imaging Review Plain radiographs demonstrate severe degenerative joint disease of the left knee(s). The overall alignment ismild varus. The bone quality appears to be good for age  and reported activity level.      Assessment/Plan:  End stage arthritis, left knee   The patient  history, physical examination, clinical judgment of the provider and imaging studies are consistent with end stage degenerative joint disease of the left knee(s) and total knee arthroplasty is deemed medically necessary. The treatment options including medical management, injection therapy arthroscopy and arthroplasty were discussed at length. The risks and benefits of total knee arthroplasty were presented and reviewed. The risks due to aseptic loosening, infection, stiffness, patella tracking problems, thromboembolic complications and other imponderables were discussed. The patient acknowledged the explanation, agreed to proceed with the plan and consent was signed. Patient is being admitted for inpatient treatment for surgery, pain control, PT, OT, prophylactic antibiotics, VTE prophylaxis, progressive ambulation and ADL's and discharge planning. The patient is planning to be discharged home with home health services

## 2021-03-11 NOTE — Brief Op Note (Signed)
03/11/2021  1:27 PM  PATIENT:  Heather [name]  76 y.o. female  PRE-OPERATIVE DIAGNOSIS:  Osteoarthritis, Degenerative Joint Diease Left Knee  POST-OPERATIVE DIAGNOSIS:  Osteoarthritis, Degenerative Joint Diease Left Knee  PROCEDURE:  Procedure(s): Left TOTAL KNEE ARTHROPLASTY (Left)  SURGEON:  Surgeon(s) and Role:    Mcarthur Rossetti, MD - Primary  PHYSICIAN ASSISTANT:  Benita Stabile, PA-C  ANESTHESIA:   regional, spinal, and general  COUNTS:  YES  TOURNIQUET:   Total Tourniquet Time Documented: Thigh (Left) - 54 minutes Total: Thigh (Left) - 54 minutes   DICTATION: .Other Dictation: Dictation Number [id number]  PLAN OF CARE: Admit for overnight observation  PATIENT DISPOSITION:  PACU - hemodynamically stable.   Delay start of Pharmacological VTE agent (>24hrs) due to surgical blood loss or risk of bleeding: no

## 2021-03-11 NOTE — Anesthesia Procedure Notes (Signed)
Procedure Name: Intubation Date/Time: 03/11/2021 11:56 AM Performed by: Claris Che, CRNA Pre-anesthesia Checklist: Patient identified, Emergency Drugs available, Suction available, Patient being monitored and Timeout performed Patient Re-evaluated:Patient Re-evaluated prior to induction Oxygen Delivery Method: Circle system utilized Preoxygenation: Pre-oxygenation with 100% oxygen Induction Type: Cricoid Pressure applied Laryngoscope Size: Mac and 4 Grade View: Grade II Tube type: Oral Tube size: 7.0 mm Number of attempts: 1 Airway Equipment and Method: Stylet Placement Confirmation: ETT inserted through vocal cords under direct vision, positive ETCO2 and breath sounds checked- equal and bilateral Secured at: 22 cm Tube secured with: Tape Dental Injury: Teeth and Oropharynx as per pre-operative assessment

## 2021-03-12 ENCOUNTER — Encounter (HOSPITAL_COMMUNITY): Payer: Self-pay | Admitting: Orthopaedic Surgery

## 2021-03-12 DIAGNOSIS — Z825 Family history of asthma and other chronic lower respiratory diseases: Secondary | ICD-10-CM | POA: Diagnosis not present

## 2021-03-12 DIAGNOSIS — E1169 Type 2 diabetes mellitus with other specified complication: Secondary | ICD-10-CM | POA: Diagnosis present

## 2021-03-12 DIAGNOSIS — Z818 Family history of other mental and behavioral disorders: Secondary | ICD-10-CM | POA: Diagnosis not present

## 2021-03-12 DIAGNOSIS — Z91010 Allergy to peanuts: Secondary | ICD-10-CM | POA: Diagnosis not present

## 2021-03-12 DIAGNOSIS — R7303 Prediabetes: Secondary | ICD-10-CM | POA: Diagnosis present

## 2021-03-12 DIAGNOSIS — E7800 Pure hypercholesterolemia, unspecified: Secondary | ICD-10-CM | POA: Diagnosis present

## 2021-03-12 DIAGNOSIS — Z91018 Allergy to other foods: Secondary | ICD-10-CM | POA: Diagnosis not present

## 2021-03-12 DIAGNOSIS — Z79899 Other long term (current) drug therapy: Secondary | ICD-10-CM | POA: Diagnosis not present

## 2021-03-12 DIAGNOSIS — I2510 Atherosclerotic heart disease of native coronary artery without angina pectoris: Secondary | ICD-10-CM | POA: Diagnosis present

## 2021-03-12 DIAGNOSIS — Z96652 Presence of left artificial knee joint: Secondary | ICD-10-CM

## 2021-03-12 DIAGNOSIS — Z888 Allergy status to other drugs, medicaments and biological substances status: Secondary | ICD-10-CM | POA: Diagnosis not present

## 2021-03-12 DIAGNOSIS — Z800 Family history of malignant neoplasm of digestive organs: Secondary | ICD-10-CM | POA: Diagnosis not present

## 2021-03-12 DIAGNOSIS — M25762 Osteophyte, left knee: Secondary | ICD-10-CM | POA: Diagnosis present

## 2021-03-12 DIAGNOSIS — Z87891 Personal history of nicotine dependence: Secondary | ICD-10-CM | POA: Diagnosis not present

## 2021-03-12 DIAGNOSIS — Z880 Allergy status to penicillin: Secondary | ICD-10-CM | POA: Diagnosis not present

## 2021-03-12 DIAGNOSIS — Z8261 Family history of arthritis: Secondary | ICD-10-CM | POA: Diagnosis not present

## 2021-03-12 DIAGNOSIS — M170 Bilateral primary osteoarthritis of knee: Secondary | ICD-10-CM | POA: Diagnosis present

## 2021-03-12 DIAGNOSIS — Z8249 Family history of ischemic heart disease and other diseases of the circulatory system: Secondary | ICD-10-CM | POA: Diagnosis not present

## 2021-03-12 DIAGNOSIS — Z803 Family history of malignant neoplasm of breast: Secondary | ICD-10-CM | POA: Diagnosis not present

## 2021-03-12 DIAGNOSIS — H9190 Unspecified hearing loss, unspecified ear: Secondary | ICD-10-CM | POA: Diagnosis present

## 2021-03-12 DIAGNOSIS — I10 Essential (primary) hypertension: Secondary | ICD-10-CM | POA: Diagnosis present

## 2021-03-12 DIAGNOSIS — G4733 Obstructive sleep apnea (adult) (pediatric): Secondary | ICD-10-CM | POA: Diagnosis present

## 2021-03-12 LAB — BASIC METABOLIC PANEL
Anion gap: 9 (ref 5–15)
BUN: 9 mg/dL (ref 8–23)
CO2: 23 mmol/L (ref 22–32)
Calcium: 8.5 mg/dL — ABNORMAL LOW (ref 8.9–10.3)
Chloride: 102 mmol/L (ref 98–111)
Creatinine, Ser: 0.79 mg/dL (ref 0.44–1.00)
GFR, Estimated: >60 mL/min (ref >60)
Glucose, Bld: 160 mg/dL — ABNORMAL HIGH (ref 70–99)
Potassium: 3.5 mmol/L (ref 3.5–5.1)
Sodium: 134 mmol/L — ABNORMAL LOW (ref 135–145)

## 2021-03-12 LAB — CBC
HCT: 38.4 % (ref 36.0–46.0)
Hemoglobin: 12.5 g/dL (ref 12.0–15.0)
MCH: 31.1 pg (ref 26.0–34.0)
MCHC: 32.6 g/dL (ref 30.0–36.0)
MCV: 95.5 fL (ref 80.0–100.0)
Platelets: 185 10*3/uL (ref 150–400)
RBC: 4.02 MIL/uL (ref 3.87–5.11)
RDW: 14.2 % (ref 11.5–15.5)
WBC: 7.4 10*3/uL (ref 4.0–10.5)
nRBC: 0 % (ref 0.0–0.2)

## 2021-03-12 NOTE — Care Plan (Signed)
Ortho Bundle Case Management Note  Patient Details  Name: Heather [name] MRN: [id number] Date of Birth: 07-04-1945  [facility] office RNCM call to patient prior to her upcoming surgery to discuss her Left total knee arthroplasty with Dr. Ninfa Linden on 03/11/21.She is an Ortho bundle patient through [facility] and is agreeable to case management. She lives with her husband, who will be assisting after surgery. She has a rollator, but will need a RW and 3in1/BSC. These will be ordered through [place] and provided by [facility] staff prior to discharge. Anticipate HHPT will be needed after a short hospital stay. Referral made to [facility] after choice provided. Reviewed all post op care instructions with patient and her husband. Answered all questions. Will provide written handout at hospital after surgery. Will continue to follow for needs                  DME Arranged:  [facility] rolling, 3-N-1 (Patient has rollator at home, but will need standard RW) DME Agency:  [facility]  HH Arranged:  PT [place] Agency:  [place]  Additional Comments: Please contact me with any questions of if this plan should need to change.  Jamse Arn, RN, BSN, [employer]  [phone] 03/12/2021, 9:05 AM

## 2021-03-12 NOTE — Care Management Obs Status (Signed)
[facility] NOTIFICATION   Patient Details  Name: Heather [name] MRN: [id number] Date of Birth: 09-Dec-1945   Medicare Observation Status Notification Given:  Yes    Carles Collet, RN 03/12/2021, 1:29 PM

## 2021-03-12 NOTE — Progress Notes (Signed)
Subjective: 1 Day Post-Op Procedure(s) (LRB): Left TOTAL KNEE ARTHROPLASTY (Left) Patient reports pain as moderate.  Sleepiness has slowed her mobility.  Woriking with PT/OT.  Objective: Vital signs in last 24 hours: Temp:  [97.6 F (36.4 C)-99.8 F (37.7 C)] 97.8 F (36.6 C) (01/11 0716) Pulse Rate:  [75-110] 110 (01/11 0716) Resp:  [14-24] 18 (01/11 0716) BP: (96-164)/(55-104) 96/55 (01/11 0716) SpO2:  [75 %-100 %] 96 % (01/11 0716)  Intake/Output from previous day: 01/10 0701 - 01/11 0700 In: 1200 [I.V.:1200] Out: 1520 [Urine:1500; Blood:20] Intake/Output this shift: No intake/output data recorded.  Recent Labs    03/12/21 0242  HGB 12.5   Recent Labs    03/12/21 0242  WBC 7.4  RBC 4.02  HCT 38.4  PLT 185   Recent Labs    03/12/21 0242  NA 134*  K 3.5  CL 102  CO2 23  BUN 9  CREATININE 0.79  GLUCOSE 160*  CALCIUM 8.5*   No results for input(s): LABPT, INR in the last 72 hours.  Sensation intact distally Intact pulses distally Dorsiflexion/Plantar flexion intact Incision: scant drainage No cellulitis present Compartment soft   Assessment/Plan: 1 Day Post-Op Procedure(s) (LRB): Left TOTAL KNEE ARTHROPLASTY (Left) Up with therapy Plan for discharge tomorrow Discharge home with home health      Mcarthur Rossetti 03/12/2021, 11:49 AM

## 2021-03-12 NOTE — Evaluation (Signed)
Occupational Therapy Evaluation Patient Details Name: Heather [name] MRN: [id number] DOB: 03-16-1945 Today's Date: 03/12/2021   History of Present Illness Pt is a 76 y.o. F who presents s/p left total knee arthroplasty 03/11/2021. Significant PMH: R knee osteoarthritis, DM2, obesity, urinary incontinence, DM, peripheral neuropathy.   Clinical Impression   Tyshana reports being generally mod I PTA, she uses canes for mobility but completes ADLs indep. She lives in a 1 level home with her husband and her daughter plans to stay with her for 1 week at d/c. Upon evaluation pt was lethargic and falling asleep throughout. She verbalized understanding of L knee precautions and KI wear/care schedule. Reviewed compensatory techniques for LB dressing, pt demonstrated fair ability but is requiring up to max A for ADLs. Pt will benefit from continued OT acutely to progress towards mod I level prior to going home. Recommend d/c to home with [place].      Recommendations for follow up therapy are one component of a multi-disciplinary discharge planning process, led by the attending physician.  Recommendations may be updated based on patient status, additional functional criteria and insurance authorization.   Follow Up Recommendations  Home health OT    Assistance Recommended at Discharge Frequent or constant Supervision/Assistance  Patient can return home with the following A little help with walking and/or transfers;A lot of help with bathing/dressing/bathroom;Assistance with cooking/housework;Assist for transportation;Help with stairs or ramp for entrance    Functional Status Assessment  Patient has had a recent decline in their functional status and demonstrates the ability to make significant improvements in function in a reasonable and predictable amount of time.  Equipment Recommendations  BSC/3in1 (RW)    Recommendations for Other Services       Precautions / Restrictions Precautions Precautions:  Fall Required Braces or Orthoses: Knee Immobilizer - Right Knee Immobilizer - Right: On when out of bed or walking Restrictions Weight Bearing Restrictions: Yes LLE Weight Bearing: Weight bearing as tolerated      Mobility Bed Mobility Overal bed mobility: Needs Assistance Bed Mobility: Supine to Sit     Supine to sit: Mod assist     General bed mobility comments: pt in chair upon arrival    Transfers Overall transfer level: Needs assistance Equipment used: Rolling walker (2 wheels) Transfers: Sit to/from Stand Sit to Stand: Mod assist           General transfer comment: ModA to rise from elevated bed height      Balance Overall balance assessment: Needs assistance Sitting-balance support: Feet supported Sitting balance-Leahy Scale: Fair     Standing balance support: Single extremity supported;During functional activity Standing balance-Leahy Scale: Poor Standing balance comment: reliant on external support                           ADL either performed or assessed with clinical judgement   ADL Overall ADL's : Needs assistance/impaired Eating/Feeding: Independent;Sitting   Grooming: Set up;Sitting   Upper Body Bathing: Set up;Sitting   Lower Body Bathing: Sit to/from stand   Upper Body Dressing : Set up;Sitting   Lower Body Dressing: Sit to/from stand;Maximal assistance   Toilet Transfer: Moderate assistance;Ambulation;Rolling walker (2 wheels) Toilet Transfer Details (indicate cue type and reason): +significantly increased time, elevated toilet & grab bars Toileting- Clothing Manipulation and Hygiene: Minimal assistance;Sitting/lateral lean       Functional mobility during ADLs: Moderate assistance;Rolling walker (2 wheels) General ADL Comments: assistance for LLE, transfers into standing,  bimanual UE tasks in standing +pt requried significantly incrased time for all standing tasks     Vision Baseline Vision/History: 0 No visual  deficits Ability to See in Adequate Light: 0 Adequate Patient Visual Report: No change from baseline Vision Assessment?: No apparent visual deficits            Pertinent Vitals/Pain Pain Assessment: Faces Faces Pain Scale: Hurts little more Pain Location: L knee Pain Descriptors / Indicators: Grimacing;Operative site guarding;Discomfort Pain Intervention(s): Monitored during session;Limited activity within patient's tolerance     Hand Dominance Right   Extremity/Trunk Assessment Upper Extremity Assessment Upper Extremity Assessment: Generalized weakness   Lower Extremity Assessment Lower Extremity Assessment: Defer to PT evaluation RLE Deficits / Details: Grossly weak, 3/5 LLE Deficits / Details: S/p TKA. Grossly 1/5, ankle dorsiflexion WFL   Cervical / Trunk Assessment Cervical / Trunk Assessment: Other exceptions Cervical / Trunk Exceptions: increased body habitus   Communication Communication Communication: No difficulties   Cognition Arousal/Alertness: Awake/alert Behavior During Therapy: WFL for tasks assessed/performed Overall Cognitive Status: Within Functional Limits for tasks assessed       General Comments: drowsy throughout session     General Comments  VSS on RA, pt lethargic throughout and falling asleep    Exercises Exercises: Total Joint Total Joint Exercises Ankle Circles/Pumps: Both;20 reps;Supine Quad Sets: Left;10 reps;Supine Heel Slides: AAROM;Left;5 reps;Supine Hip ABduction/ADduction: AAROM;Left;5 reps;Supine Knee Flexion: AAROM;Left;5 reps;Seated Goniometric ROM: 3-75 degrees   Shoulder Instructions      Home Living Family/patient expects to be discharged to:: Private residence Living Arrangements: Spouse/significant other Available Help at Discharge: Family Type of Home: House Home Access: Stairs to enter Technical brewer of Steps: 3 Entrance Stairs-Rails: Right;Left Home Layout: One level     Bathroom Shower/Tub:  Occupational psychologist: Handicapped height (with riser)     Home Equipment: Rollator (4 wheels);Cane - single point;Toilet riser   Additional Comments: pt's daughter staying with pt for 1 week      Prior Functioning/Environment Prior Level of Function : Needs assist             Mobility Comments: using 2 canes, limited household ambulator ADLs Comments: states independent with ADL's        OT Problem List: Decreased strength;Decreased range of motion;Decreased activity tolerance;Impaired balance (sitting and/or standing);Decreased safety awareness;Decreased knowledge of use of DME or AE;Decreased knowledge of precautions;Pain      OT Treatment/Interventions: Self-care/ADL training;Therapeutic exercise;DME and/or AE instruction;Therapeutic activities;Patient/family education;Balance training    OT Goals(Current goals can be found in the care plan section) Acute Rehab OT Goals Patient Stated Goal: home today OT Goal Formulation: With patient Time For Goal Achievement: 03/26/21 Potential to Achieve Goals: Good ADL Goals Pt Will Perform Grooming: with modified independence;standing Pt Will Perform Lower Body Bathing: with modified independence;sit to/from stand Pt Will Perform Lower Body Dressing: with modified independence;sit to/from stand Pt Will Transfer to Toilet: with modified independence;ambulating  OT Frequency: Min 2X/week       AM-PAC OT "6 Clicks" Daily Activity     Outcome Measure Help from another person eating meals?: None Help from another person taking care of personal grooming?: A Little Help from another person toileting, which includes using toliet, bedpan, or urinal?: A Lot Help from another person bathing (including washing, rinsing, drying)?: A Lot Help from another person to put on and taking off regular upper body clothing?: A Little Help from another person to put on and taking off regular lower body clothing?: A  Lot 6 Click Score:  16   End of Session Equipment Utilized During Treatment: Gait belt;Rolling walker (2 wheels);Left knee immobilizer Nurse Communication: Mobility status  Activity Tolerance: Patient limited by lethargy Patient left: in chair;with call bell/phone within reach  OT Visit Diagnosis: Unsteadiness on feet (R26.81);Other abnormalities of gait and mobility (R26.89);Muscle weakness (generalized) (M62.81);Pain                Time: 0840-0900 OT Time Calculation (min): 20 min Charges:  OT General Charges $OT Visit: 1 Visit OT Evaluation $OT Eval Moderate Complexity: 1 Mod  Amariyana Heacox A Tyke Outman 03/12/2021, 10:01 AM

## 2021-03-12 NOTE — Progress Notes (Signed)
Physical Therapy Treatment Patient Details Name: Heather [name] MRN: [id number] DOB: 03/02/1946 Today's Date: 03/12/2021   History of Present Illness Pt is a 76 y.o. F who presents s/p left total knee arthroplasty 03/11/2021. Significant PMH: R knee osteoarthritis, DM2, obesity, urinary incontinence, DM, peripheral neuropathy.    PT Comments    Pt progressing towards her physical therapy goals, demonstrating improved alertness this session. Pt requiring moderate assist to stand from recliner and ambulating 80 feet with a walker and min assist, utilizing a step to pattern. KI donned for stability in setting of gross left lower extremity weakness. Extensive education provided to pt/pt family regarding KI use, knee positioning, HEP, gait belt use (for guarding and LLE negotiation into and out of bed), cryotherapy, TED hose use, activity recommendations and progression. Will benefit from additional therapy tomorrow.    Recommendations for follow up therapy are one component of a multi-disciplinary discharge planning process, led by the attending physician.  Recommendations may be updated based on patient status, additional functional criteria and insurance authorization.  Follow Up Recommendations  Home health PT     Assistance Recommended at Discharge Frequent or constant Supervision/Assistance  Patient can return home with the following A lot of help with walking and/or transfers;A lot of help with bathing/dressing/bathroom;Help with stairs or ramp for entrance   Equipment Recommendations  Rolling walker (2 wheels);BSC/3in1 (would benefit from bariatric)    Recommendations for Other Services       Precautions / Restrictions Precautions Precautions: Fall Required Braces or Orthoses: Knee Immobilizer - Right Knee Immobilizer - Right: On when out of bed or walking Restrictions Weight Bearing Restrictions: Yes LLE Weight Bearing: Weight bearing as tolerated     Mobility  Bed  Mobility Overal bed mobility: Needs Assistance Bed Mobility: Sit to Supine       Sit to supine: Min assist   General bed mobility comments: Cues to scoot back onto bed, pt using gait belt strap to elevate LLE, but still required additional assist.    Transfers Overall transfer level: Needs assistance Equipment used: Rolling walker (2 wheels) Transfers: Sit to/from Stand Sit to Stand: Mod assist           General transfer comment: Cues for scooting forward to edge of chair, hand placement pushing off of armrests, rocking to gain momentum and "nose over toes." requiring modA to rise    Ambulation/Gait Ambulation/Gait assistance: Min assist Gait Distance (Feet): 80 Feet Assistive device: Rolling walker (2 wheels) Gait Pattern/deviations: Step-to pattern;Decreased stance time - left;Decreased dorsiflexion - left;Decreased weight shift to left;Trunk flexed Gait velocity: decreased Gait velocity interpretation: <1.8 ft/sec, indicate of risk for recurrent falls   General Gait Details: Cues for sequencing, technique, walker use, decreased L step length for improved control, upright posture, glute activation, and continuity of gait. pt with very slow and effortful pattern, although improved from initial session.   Stairs             Wheelchair Mobility    Modified Rankin (Stroke Patients Only)       Balance Overall balance assessment: Needs assistance Sitting-balance support: Feet supported Sitting balance-Leahy Scale: Fair     Standing balance support: Bilateral upper extremity supported Standing balance-Leahy Scale: Poor Standing balance comment: heavily reliant on RW                            Cognition Arousal/Alertness: Awake/alert Behavior During Therapy: [facility] for tasks assessed/performed Overall Cognitive  Status: Within Functional Limits for tasks assessed                                          Exercises Total Joint  Exercises Quad Sets: Left;10 reps;Supine    General Comments        Pertinent Vitals/Pain Pain Assessment: Faces Faces Pain Scale: Hurts little more Pain Location: L knee Pain Descriptors / Indicators: Grimacing;Operative site guarding;Discomfort Pain Intervention(s): Monitored during session    Home Living                          Prior Function            PT Goals (current goals can now be found in the care plan section) Acute Rehab PT Goals Patient Stated Goal: "do what I need to do to get better." PT Goal Formulation: With patient Time For Goal Achievement: 03/26/21 Potential to Achieve Goals: Good Progress towards PT goals: Progressing toward goals    Frequency    7X/week      PT Plan Current plan remains appropriate    Co-evaluation              AM-PAC PT "6 Clicks" Mobility   Outcome Measure  Help needed turning from your back to your side while in a flat bed without using bedrails?: A Little Help needed moving from lying on your back to sitting on the side of a flat bed without using bedrails?: A Little Help needed moving to and from a bed to a chair (including a wheelchair)?: A Little Help needed standing up from a chair using your arms (e.g., wheelchair or bedside chair)?: A Lot Help needed to walk in hospital room?: A Little Help needed climbing 3-5 steps with a railing? : A Lot 6 Click Score: 16    End of Session Equipment Utilized During Treatment: Gait belt;Other (comment) (KI) Activity Tolerance: Patient tolerated treatment well Patient left: with call bell/phone within reach;in bed;with family/visitor present Nurse Communication: Mobility status PT Visit Diagnosis: Unsteadiness on feet (R26.81);Other abnormalities of gait and mobility (R26.89);Muscle weakness (generalized) (M62.81);Difficulty in walking, not elsewhere classified (R26.2);Pain Pain - Right/Left: Left Pain - part of body: Knee     Time: 2248-2500 PT Time  Calculation (min) (ACUTE ONLY): 57 min  Charges:  $Gait Training: 23-37 mins $Therapeutic Activity: 8-22 mins $Self Care/Home Management: 8-22                     Wyona Almas, PT, DPT Acute Rehabilitation Services Pager [phone] Office [phone]    Deno Etienne 03/12/2021, 4:45 PM

## 2021-03-12 NOTE — Evaluation (Signed)
Physical Therapy Evaluation Patient Details Name: Heather [name] MRN: [id number] DOB: 06/04/1945 Today's Date: 03/12/2021  History of Present Illness  Pt is a 76 y.o. F who presents s/p left total knee arthroplasty 03/11/2021. Significant PMH: R knee osteoarthritis, DM2, obesity, urinary incontinence, DM, peripheral neuropathy.  Clinical Impression  Pt admitted s/p L TKA. Presents with generalized weakness, decreased ROM, gait abnormalities, decreased activity tolerance, and balance deficits. Pt requiring moderate assist for bed mobility and transfers and ambulating 40 feet with a walker at a min assist level. Utilized knee immobilizer due to gross weakness in left lower extremity post op. Initiated performing HEP for left knee ROM and strengthening. Would benefit from an additional day of therapy to address deficits and maximize functional mobility.      Recommendations for follow up therapy are one component of a multi-disciplinary discharge planning process, led by the attending physician.  Recommendations may be updated based on patient status, additional functional criteria and insurance authorization.  Follow Up Recommendations Home health PT    Assistance Recommended at Discharge Frequent or constant Supervision/Assistance  Patient can return home with the following  A lot of help with walking and/or transfers;A lot of help with bathing/dressing/bathroom;Help with stairs or ramp for entrance    Equipment Recommendations Rolling walker (2 wheels);BSC/3in1 (would benefit from bariatric)  Recommendations for Other Services       Functional Status Assessment Patient has had a recent decline in their functional status and demonstrates the ability to make significant improvements in function in a reasonable and predictable amount of time.     Precautions / Restrictions Precautions Precautions: Fall Required Braces or Orthoses: Knee Immobilizer - Right Knee Immobilizer - Right: On when  out of bed or walking Restrictions Weight Bearing Restrictions: Yes LLE Weight Bearing: Weight bearing as tolerated      Mobility  Bed Mobility Overal bed mobility: Needs Assistance Bed Mobility: Supine to Sit     Supine to sit: Mod assist     General bed mobility comments: Provided pt with use of gait belt for LLE negotiation, however, still needed assist to bring off edge of bed and use of bed pad to scoot hips to edge    Transfers Overall transfer level: Needs assistance Equipment used: Rolling walker (2 wheels) Transfers: Sit to/from Stand Sit to Stand: Mod assist           General transfer comment: ModA to rise from elevated bed height    Ambulation/Gait Ambulation/Gait assistance: Min assist Gait Distance (Feet): 40 Feet Assistive device: Rolling walker (2 wheels) Gait Pattern/deviations: Step-to pattern;Decreased stance time - left;Decreased dorsiflexion - left;Decreased weight shift to left;Trunk flexed Gait velocity: decreased Gait velocity interpretation: <1.31 ft/sec, indicative of household ambulator   General Gait Details: Max multimodal cues for sequencing, technique, upright posture, walker use, segmental turning. MinA for steering walker and balance  Stairs            Wheelchair Mobility    Modified Rankin (Stroke Patients Only)       Balance Overall balance assessment: Needs assistance Sitting-balance support: Feet supported Sitting balance-Leahy Scale: Fair     Standing balance support: Bilateral upper extremity supported Standing balance-Leahy Scale: Poor Standing balance comment: heavily reliant on RW                             Pertinent Vitals/Pain Pain Assessment: Faces Faces Pain Scale: Hurts little more Pain Location: L knee Pain  Descriptors / Indicators: Grimacing;Operative site guarding;Discomfort Pain Intervention(s): Limited activity within patient's tolerance;Monitored during session;Premedicated before  session;Repositioned;Ice applied    Home Living Family/patient expects to be discharged to:: Private residence Living Arrangements: Spouse/significant other Available Help at Discharge: Family Type of Home: House Home Access: Stairs to enter Entrance Stairs-Rails: Psychiatric nurse of Steps: 3   Home Layout: One level Home Equipment: Rollator (4 wheels);Cane - single point;Toilet riser      Prior Function Prior Level of Function : Needs assist             Mobility Comments: using 2 canes, limited household ambulator ADLs Comments: states independent with ADL's?     Hand Dominance        Extremity/Trunk Assessment   Upper Extremity Assessment Upper Extremity Assessment: Defer to OT evaluation    Lower Extremity Assessment Lower Extremity Assessment: RLE deficits/detail;LLE deficits/detail RLE Deficits / Details: Grossly weak, 3/5 LLE Deficits / Details: S/p TKA. Grossly 1/5, ankle dorsiflexion WFL    Cervical / Trunk Assessment Cervical / Trunk Assessment: Other exceptions Cervical / Trunk Exceptions: increased body habitus  Communication   Communication: No difficulties  Cognition Arousal/Alertness: Awake/alert Behavior During Therapy: WFL for tasks assessed/performed Overall Cognitive Status: Within Functional Limits for tasks assessed                                 General Comments: drowsy throughout session        General Comments      Exercises Total Joint Exercises Ankle Circles/Pumps: Both;20 reps;Supine Quad Sets: Left;10 reps;Supine Heel Slides: AAROM;Left;5 reps;Supine Hip ABduction/ADduction: AAROM;Left;5 reps;Supine Knee Flexion: AAROM;Left;5 reps;Seated Goniometric ROM: 3-75 degrees   Assessment/Plan    PT Assessment Patient needs continued PT services  PT Problem List Decreased strength;Decreased range of motion;Decreased activity tolerance;Decreased balance;Decreased mobility;Obesity;Pain       PT  Treatment Interventions DME instruction;Gait training;Stair training;Functional mobility training;Therapeutic activities;Therapeutic exercise;Balance training;Patient/family education    PT Goals (Current goals can be found in the Care Plan section)  Acute Rehab PT Goals Patient Stated Goal: "do what I need to do to get better." PT Goal Formulation: With patient Time For Goal Achievement: 03/26/21 Potential to Achieve Goals: Good    Frequency 7X/week     Co-evaluation               AM-PAC PT "6 Clicks" Mobility  Outcome Measure Help needed turning from your back to your side while in a flat bed without using bedrails?: A Little Help needed moving from lying on your back to sitting on the side of a flat bed without using bedrails?: A Lot Help needed moving to and from a bed to a chair (including a wheelchair)?: A Little Help needed standing up from a chair using your arms (e.g., wheelchair or bedside chair)?: A Lot Help needed to walk in hospital room?: A Little Help needed climbing 3-5 steps with a railing? : Total 6 Click Score: 14    End of Session Equipment Utilized During Treatment: Gait belt;Other (comment) (KI) Activity Tolerance: Patient tolerated treatment well Patient left: in chair;with call bell/phone within reach Nurse Communication: Mobility status PT Visit Diagnosis: Unsteadiness on feet (R26.81);Other abnormalities of gait and mobility (R26.89);Muscle weakness (generalized) (M62.81);Difficulty in walking, not elsewhere classified (R26.2);Pain Pain - Right/Left: Left Pain - part of body: Knee    Time: 8676-7209 PT Time Calculation (min) (ACUTE ONLY): 40 min   Charges:  PT Evaluation $PT Eval Low Complexity: 1 Low PT Treatments $Gait Training: 8-22 mins $Therapeutic Exercise: 8-22 mins        Wyona Almas, PT, DPT Acute Rehabilitation Services Pager [phone] Office [phone]   Deno Etienne 03/12/2021, 8:51 AM

## 2021-03-12 NOTE — TOC Transition Note (Signed)
Transition of Care [facility]) - CM/SW Discharge Note   Patient Details  Name: SHAYANA HORNSTEIN MRN: [id number] Date of Birth: 12-06-1945  [facility] CM/SW Contact:  Carles Collet, RN Phone Number: 03/12/2021, 9:59 AM   Clinical Narrative:    Patient pre assigned to [place] prior to admission. Notified liaison of discahrge today. Unit staff to provide any needed DME No other TOC needs identified    Final next level of care: Home w Home Health Services Barriers to Discharge: No Barriers Identified   Patient Goals and CMS Choice Patient states their goals for this hospitalization and ongoing recovery are:: to go home   Choice offered to / list presented to : NA (pre assigned by office)  Discharge Placement                       Discharge Plan and Services                DME Arranged: Walker rolling, 3-N-1 (Patient has rollator at home, but will need standard RW) DME Agency: [employer]       HH Arranged: PT, OT HH Agency: [place] Date [place]: 03/12/21 Time [place]: [phone] Representative spoke with at [place]: [place] ([place]) Interventions     Readmission Risk Interventions No flowsheet data found.

## 2021-03-12 NOTE — Discharge Instructions (Signed)

## 2021-03-13 MED ORDER — OXYCODONE HCL 5 MG PO TABS
5.0000 mg | ORAL_TABLET | Freq: Four times a day (QID) | ORAL | 0 refills | Status: DC | PRN
Start: 2021-03-13 — End: 2021-03-20

## 2021-03-13 NOTE — Progress Notes (Signed)
Patient alert and oriented, mae's well, voiding adequate amount of urine, swallowing without difficulty, no c/o pain at time of discharge. Patient discharged home with family. Script and discharged instructions given to patient. Patient and family stated understanding of instructions given. Patient has an appointment with Dr. Ninfa Linden

## 2021-03-13 NOTE — Progress Notes (Signed)
Occupational Therapy Treatment Patient Details Name: Heather [name] MRN: [id number] DOB: 1945/04/06 Today's Date: 03/13/2021   History of present illness Pt is a 76 y.o. F who presents s/p left total knee arthroplasty 03/11/2021. Significant PMH: R knee osteoarthritis, DM2, obesity, urinary incontinence, DM, peripheral neuropathy.   OT comments  Patient supine in bed and agreeable to OT session.  Patient used gait belt to get LLE onto EOB and required assistance to scoot to EOB with use of bed pads.  Patient was mod assist to stand from raised bed and ambulated to bathroom for bathing and grooming. Patient was able to stand for 15 minutes with supervision for grooming and bathing. Patient performed dressing seated on EOB with education on reacher use. Patient and husband educated on sock aide but did not perform. Patient required increased time to perform self care due to rest break needed following grooming. Patient is expected to return home to day and receive HHOT.    Recommendations for follow up therapy are one component of a multi-disciplinary discharge planning process, led by the attending physician.  Recommendations may be updated based on patient status, additional functional criteria and insurance authorization.    Follow Up Recommendations  Home health OT    Assistance Recommended at Discharge Frequent or constant Supervision/Assistance  Patient can return home with the following  A little help with walking and/or transfers;A lot of help with bathing/dressing/bathroom;Assistance with cooking/housework;Assist for transportation;Help with stairs or ramp for entrance   Equipment Recommendations  BSC/3in1    Recommendations for Other Services      Precautions / Restrictions Precautions Precautions: Fall Required Braces or Orthoses: Knee Immobilizer - Right Knee Immobilizer - Right: On when out of bed or walking Restrictions Weight Bearing Restrictions: Yes LLE Weight Bearing:  Weight bearing as tolerated       Mobility Bed Mobility Overal bed mobility: Needs Assistance Bed Mobility: Supine to Sit     Supine to sit: Mod assist     General bed mobility comments: assistance to scoot hips to EOB.  Used gait belt to assist with getting LLE to EOB    Transfers Overall transfer level: Needs assistance Equipment used: Rolling walker (2 wheels) Transfers: Sit to/from Stand Sit to Stand: Mod assist           General transfer comment: mod assist to stand from raised bed and min guard once up     Balance Overall balance assessment: Needs assistance Sitting-balance support: Feet supported Sitting balance-Leahy Scale: Fair     Standing balance support: Bilateral upper extremity supported Standing balance-Leahy Scale: Poor Standing balance comment: reliant on RW or sink for balance                           ADL either performed or assessed with clinical judgement   ADL Overall ADL's : Needs assistance/impaired     Grooming: Wash/dry hands;Wash/dry face;Oral care;Applying deodorant;Standing;Supervision/safety Grooming Details (indicate cue type and reason): stood at sink to perform grooming with use of sink to assist with balance Upper Body Bathing: Supervision/ safety;Standing Upper Body Bathing Details (indicate cue type and reason): standing at sink Lower Body Bathing: Supervison/ safety;Sit to/from stand Lower Body Bathing Details (indicate cue type and reason): bathed peri neal area while standing Upper Body Dressing : Minimal assistance;Standing Upper Body Dressing Details (indicate cue type and reason): donned pullover top while standing Lower Body Dressing: Moderate assistance;With adaptive equipment;Sit to/from stand Lower Body Dressing Details (indicate  cue type and reason): education on reacher use to thread legs into clothing with assistance to perform.  Patient able to pull up clothing and donned skirt overhead              Functional mobility during ADLs: Min guard;Rolling walker (2 wheels) General ADL Comments: assistance for sit to stands.  Patient stood at sink for 15 minutes to perform grooming and bathing tasks    Extremity/Trunk Assessment              Vision       Perception     Praxis      Cognition Arousal/Alertness: Awake/alert Behavior During Therapy: WFL for tasks assessed/performed Overall Cognitive Status: Within Functional Limits for tasks assessed                                 General Comments: more alert today          Exercises     Shoulder Instructions       General Comments      Pertinent Vitals/ Pain       Pain Assessment: Faces Faces Pain Scale: Hurts a little bit Pain Location: L knee Pain Descriptors / Indicators: Grimacing Pain Intervention(s): Monitored during session;Premedicated before session;Repositioned  Home Living                                          Prior Functioning/Environment              Frequency  Min 2X/week        Progress Toward Goals  OT Goals(current goals can now be found in the care plan section)  Progress towards OT goals: Progressing toward goals  Acute Rehab OT Goals Patient Stated Goal: go home OT Goal Formulation: With patient Time For Goal Achievement: 03/26/21 Potential to Achieve Goals: Good ADL Goals Pt Will Perform Grooming: with modified independence;standing Pt Will Perform Lower Body Bathing: with modified independence;sit to/from stand Pt Will Perform Lower Body Dressing: with modified independence;sit to/from stand Pt Will Transfer to Toilet: with modified independence;ambulating  Plan Discharge plan remains appropriate    Co-evaluation                 AM-PAC OT "6 Clicks" Daily Activity     Outcome Measure   Help from another person eating meals?: None Help from another person taking care of personal grooming?: A Little Help from another person  toileting, which includes using toliet, bedpan, or urinal?: A Lot Help from another person bathing (including washing, rinsing, drying)?: A Lot Help from another person to put on and taking off regular upper body clothing?: A Little Help from another person to put on and taking off regular lower body clothing?: A Lot 6 Click Score: 16    End of Session Equipment Utilized During Treatment: Gait belt;Rolling walker (2 wheels);Left knee immobilizer  OT Visit Diagnosis: Unsteadiness on feet (R26.81);Other abnormalities of gait and mobility (R26.89);Muscle weakness (generalized) (M62.81);Pain   Activity Tolerance Patient tolerated treatment well   Patient Left in bed;with call bell/phone within reach;with family/visitor present   Nurse Communication Mobility status        Time: 0820-0916 OT Time Calculation (min): 56 min  Charges: OT General Charges $OT Visit: 1 Visit OT Treatments $Self Care/Home Management : 53-67 mins  Liliane Channel  Roxy Manns, [place]  Pager [phone] Office [place] 03/13/2021, 9:38 AM

## 2021-03-13 NOTE — Discharge Summary (Signed)
Patient ID: Heather [name] MRN: [id number] DOB/AGE: Dec 06, 1945 76 y.o.  Admit date: 03/11/2021 Discharge date: 03/13/2021  Admission Diagnoses:  Principal Problem:   Unilateral primary osteoarthritis, left knee Active Problems:   Status post total left knee replacement   Status post left knee replacement   Discharge Diagnoses:  Same  Past Medical History:  Diagnosis Date   Abdominal hernia    Abnormal Pap smear    ALLERGIC RHINITIS 10/22/2006   Allergy    ANEMIA 12/18/2008   Arthritis    scoliosis moderate deg changes lumbar Xray 11/04/17    Asthma    ASYMPTOMATIC POSTMENOPAUSAL STATUS 11/22/2007   Back pain    CAD (coronary artery disease)    in LAD   CHF (congestive heart failure) ([place])    Complication of anesthesia 2010   pt states she woke up during colonoscopy and was aware of "losing control of breathing" during mammoplasty   Degenerative arthritis    Diverticulosis    Dyslipidemia    Fibroid    Frequent headaches    GERD 07/20/2007   GOITER, MULTINODULAR 07/20/2007   Headache(784.0) 07/20/2007   HEARING LOSS 11/22/2007   Hiatal hernia    Hiatal hernia    large   History of chicken pox    Hx of colposcopy with cervical biopsy    HYPERCHOLESTEROLEMIA 01/13/2007   Hyperglycemia    HYPERTENSION 10/22/2006   Lung nodule    unchanged since 05/26/13    Migraines    NASH (nonalcoholic steatohepatitis)    Nocturnal hypoxemia 02/17/2013   Obesity    Obstructive sleep apnea    OSA on CPAP    per neurology   OSTEOARTHRITIS 10/22/2006   Other chronic nonalcoholic liver disease 67/59/1638   Pre-diabetes    Sedimentation rate elevation    Sleep apnea    on cpap   Urine incontinence    UTI (urinary tract infection)     Surgeries: Procedure(s): Left TOTAL KNEE ARTHROPLASTY on 03/11/2021   Consultants:   Discharged Condition: Improved  Hospital Course: Heather [name] is an 76 y.o. female who was admitted 03/11/2021 for operative treatment ofUnilateral  primary osteoarthritis, left knee. Patient has severe unremitting pain that affects sleep, daily activities, and work/hobbies. After pre-op clearance the patient was taken to the operating room on 03/11/2021 and underwent  Procedure(s): Left TOTAL KNEE ARTHROPLASTY.    Patient was given perioperative antibiotics:  Anti-infectives (From admission, onward)    Start     Dose/Rate Route Frequency Ordered Stop   03/11/21 1800  clindamycin (CLEOCIN) IVPB 600 mg        600 mg 100 mL/hr over 30 Minutes Intravenous Every 6 hours 03/11/21 1610 03/11/21 2352   03/11/21 1100  clindamycin (CLEOCIN) IVPB 900 mg        900 mg 100 mL/hr over 30 Minutes Intravenous On call to O.R. 03/11/21 1049 03/11/21 1157   03/11/21 1031  clindamycin (CLEOCIN) 900 MG/50ML IVPB       Note to Pharmacy: Gustavo Lah J: cabinet override      03/11/21 1031 03/11/21 1218        Patient was given sequential compression devices, early ambulation, and chemoprophylaxis to prevent DVT.  Patient benefited maximally from hospital stay and there were no complications.    Recent vital signs: Patient Vitals for the past 24 hrs:  BP Temp Temp src Pulse Resp SpO2  03/13/21 0722 (!) 104/58 98.7 F (37.1 C) Oral 96 16 100 %  03/13/21 0343 (!) 93/53  99 F (37.2 C) -- 100 18 100 %  03/12/21 2248 (!) 101/52 99.5 F (37.5 C) Oral 98 18 96 %  03/12/21 1924 111/60 98.8 F (37.1 C) Oral (!) 105 18 99 %  03/12/21 1219 104/67 99.8 F (37.7 C) Oral (!) 115 18 --     Recent laboratory studies:  Recent Labs    03/12/21 0242  WBC 7.4  HGB 12.5  HCT 38.4  PLT 185  NA 134*  K 3.5  CL 102  CO2 23  BUN 9  CREATININE 0.79  GLUCOSE 160*  CALCIUM 8.5*     Discharge Medications:   Allergies as of 03/13/2021       Reactions   Aspirin Hives   Coconut Oil Hives   Lisinopril Cough   Metoprolol Itching   Other    Latex paint, the smell causes hives. Not allergic to latex gloves  General anesthesia - has had issues in past where  anesthesia was not properly administered and caused side effects   Peanut-containing Drug Products Hives, Itching   Prednisone    Unknown reaction    Strawberry Extract Hives   Penicillins Rash        Medication List     STOP taking these medications    traMADol 50 MG tablet Commonly known as: ULTRAM       TAKE these medications    acetaminophen 500 MG tablet Commonly known as: TYLENOL Take 1,000 mg by mouth 3 (three) times daily as needed for moderate pain.   cetirizine 10 MG tablet Commonly known as: ZYRTEC Take 10 mg by mouth daily.   CHEWABLE CALCIUM/D3 PO Take 1 tablet by mouth daily.   clopidogrel 75 MG tablet Commonly known as: PLAVIX TAKE 1 TABLET DAILY   Cyanocobalamin 1000 MCG Caps Take 1 capsule by mouth daily.   diclofenac Sodium 1 % Gel Commonly known as: VOLTAREN Apply 1 application topically 4 (four) times daily as needed (pain).   ezetimibe 10 MG tablet Commonly known as: Zetia Take 1 tablet (10 mg total) by mouth daily.   FISH OIL PO Take 2 g by mouth daily.   furosemide 40 MG tablet Commonly known as: LASIX TAKE 1 TABLET DAILY AS     NEEDED**SOLCO What changed: See the new instructions.   gabapentin 100 MG capsule Commonly known as: NEURONTIN Take 3 pills at bedtime   Iron 325 (65 Fe) MG Tabs Take 1 tablet by mouth daily.   multivitamin tablet Take 1 tablet by mouth daily.   naproxen sodium 220 MG tablet Commonly known as: ALEVE Take 220 mg by mouth daily as needed (pain).   OSTEO BI-FLEX ONE PER DAY PO Take 1 tablet by mouth daily.   OVER THE COUNTER MEDICATION Take 1 tablet by mouth daily. Instaflex otc supplement   oxyCODONE 5 MG immediate release tablet Commonly known as: Oxy IR/ROXICODONE Take 1-2 tablets (5-10 mg total) by mouth every 6 (six) hours as needed for moderate pain (pain score 4-6).   pantoprazole 20 MG tablet Commonly known as: PROTONIX TAKE 1 TABLET DAILY 30     MINUTES BEFORE FOOD         (DISCONTINUE DEXILANT,     DISCONTINUE 40MG DOSE)   polyethylene glycol 17 g packet Commonly known as: MIRALAX / GLYCOLAX Take 17 g by mouth daily as needed for moderate constipation.   potassium chloride SA 20 MEQ tablet Commonly known as: Klor-Con M20 Take 1 tablet (20 mEq total) by mouth daily.  pyridoxine 200 MG tablet Commonly known as: B-6 Take 200 mg by mouth daily.   rosuvastatin 40 MG tablet Commonly known as: CRESTOR Take 1 tablet (40 mg total) by mouth daily. At night   topiramate 25 MG tablet Commonly known as: TOPAMAX Take 3 tablets (75 mg total) by mouth at bedtime.   trolamine salicylate 10 % cream Commonly known as: ASPERCREME Apply 1 application topically as needed for muscle pain.   trospium 20 MG tablet Commonly known as: SANCTURA Take 1 tablet (20 mg total) by mouth 2 (two) times daily. Prn overactive bladder What changed: additional instructions   TURMERIC PO Take 538 mg by mouth daily.   Vitamin D (Ergocalciferol) 1.25 MG (50000 UNIT) Caps capsule Commonly known as: DRISDOL Take 1 capsule (50,000 Units total) by mouth every 7 (seven) days.   vitamin E 200 UNIT capsule Take 200 Units by mouth daily.               Durable Medical Equipment  (From admission, onward)           Start     Ordered   03/11/21 1638  DME 3 n 1  Once        03/11/21 1637   03/11/21 1638  DME Walker rolling  Once       Question Answer Comment  Walker: With 5 Inch Wheels   Patient needs a walker to treat with the following condition Status post total left knee replacement      03/11/21 1637            Diagnostic Studies: DG Knee Left Port  Result Date: 03/11/2021 CLINICAL DATA:  Status post left knee replacement. EXAM: PORTABLE LEFT KNEE - 1-2 VIEW COMPARISON:  December 25, 2020. FINDINGS: Left femoral and tibial components are well situated. Expected postoperative changes are noted in the soft tissues anteriorly. IMPRESSION: Status post left total  knee arthroplasty. Electronically Signed   By: Marijo Conception M.D.   On: 03/11/2021 14:55    Disposition: Discharge disposition: 01-Home or Self Care       Discharge Instructions     Discharge patient   Complete by: As directed    Discharge disposition: 01-Home or Self Care   Discharge patient date: 03/13/2021        Follow-up Information     Mcarthur Rossetti, MD. Go on 03/25/2021.   Specialty: Orthopedic Surgery Why: at 10:30 am for your 2 week post-operative appointment with Dr. Ninfa Linden in his office. Contact information: [place] [address] [phone]         [facility] Follow up.   Specialty: Home Health Services Why: [facility] will be providing services for [place] at your office after discharge. Someone will be in contact with you regarding time/date they will be coming. Contact information: [address] [place] [address] [phone]         [facility], [facility] .          [facility] Follow up.   Specialty: [place] Why: They will call you in the next 1-2 days to set up your first home appointment Contact information: [place] [address] [phone]                  Signed: Mcarthur Rossetti 03/13/2021, 9:13 AM

## 2021-03-13 NOTE — Progress Notes (Signed)
Patient ID: Heather [name], female   DOB: 09/15/1945, 76 y.o.   MRN: [id number] She has improved enough with her mobility to be able to be discharged to home safely today.  Her left operative knee is stable and her vitals are stable.

## 2021-03-13 NOTE — Progress Notes (Signed)
Physical Therapy Treatment Patient Details Name: Heather [name] MRN: [id number] DOB: Jul 15, 1945 Today's Date: 03/13/2021   History of Present Illness Pt is a 76 y.o. F who presents s/p left total knee arthroplasty 03/11/2021. Significant PMH: R knee osteoarthritis, DM2, obesity, urinary incontinence, DM, peripheral neuropathy.    PT Comments    Pt limited somewhat by fatigue after recently completing OT session, but is able to tolerate multiple transfers, ambulation, and stair training. Pt demonstrates improved transfer quality, although continuing to benefit from verbal cues for technique as well as some physical assistance. PT addresses concerns about bedside commode and car transfers during session. Pt will benefit from continued acute PT services to aide in improving activity tolerance and reducing falls risk.   Recommendations for follow up therapy are one component of a multi-disciplinary discharge planning process, led by the attending physician.  Recommendations may be updated based on patient status, additional functional criteria and insurance authorization.  Follow Up Recommendations  Home health PT     Assistance Recommended at Discharge Frequent or constant Supervision/Assistance  Patient can return home with the following A lot of help with bathing/dressing/bathroom;Help with stairs or ramp for entrance;A little help with walking and/or transfers   Equipment Recommendations  Rolling walker (2 wheels);BSC/3in1    Recommendations for Other Services       Precautions / Restrictions Precautions Precautions: Fall Required Braces or Orthoses: Knee Immobilizer - Right Knee Immobilizer - Right: On when out of bed or walking Restrictions Weight Bearing Restrictions: Yes LLE Weight Bearing: Weight bearing as tolerated     Mobility  Bed Mobility Overal bed mobility: Needs Assistance Bed Mobility: Supine to Sit     Supine to sit: Mod assist     General bed mobility  comments: assistance to scoot hips to EOB.  Used gait belt to assist with getting LLE to EOB    Transfers Overall transfer level: Needs assistance Equipment used: Rolling walker (2 wheels) Transfers: Sit to/from Stand;Bed to chair/wheelchair/BSC Sit to Stand: Min assist;Min guard     Step pivot transfers: Min guard     General transfer comment: verbal cues for transfer technique including hand placement, scooting forward, increased trunk flexion.    Ambulation/Gait Ambulation/Gait assistance: Min guard Gait Distance (Feet): 10 Feet Assistive device: Rolling walker (2 wheels) Gait Pattern/deviations: Step-to pattern;Trunk flexed Gait velocity: reduced Gait velocity interpretation: <1.31 ft/sec, indicative of household ambulator   General Gait Details: pt with shortened step-to gait, reduced stance time on LLE   Stairs Stairs: Yes Stairs assistance: Min assist Stair Management: Two rails;Step to pattern;Forwards Number of Stairs: 3     Wheelchair Mobility    Modified Rankin (Stroke Patients Only)       Balance Overall balance assessment: Needs assistance Sitting-balance support: Feet supported;Single extremity supported Sitting balance-Leahy Scale: Poor Sitting balance - Comments: right lean, bed tilted to right Postural control: Right lateral lean Standing balance support: Bilateral upper extremity supported;Reliant on assistive device for balance Standing balance-Leahy Scale: Poor Standing balance comment: reliant on RW or sink for balance                            Cognition Arousal/Alertness: Awake/alert Behavior During Therapy: WFL for tasks assessed/performed Overall Cognitive Status: Within Functional Limits for tasks assessed  General Comments: more alert today        Exercises      General Comments General comments (skin integrity, edema, etc.): VSS on RA      Pertinent Vitals/Pain  Pain Assessment: Faces Faces Pain Scale: Hurts little more Pain Location: L knee Pain Descriptors / Indicators: Grimacing Pain Intervention(s): Monitored during session    Home Living                          Prior Function            PT Goals (current goals can now be found in the care plan section) Acute Rehab PT Goals Patient Stated Goal: "do what I need to do to get better." Progress towards PT goals: Progressing toward goals    Frequency    7X/week      PT Plan Current plan remains appropriate    Co-evaluation              AM-PAC PT "6 Clicks" Mobility   Outcome Measure  Help needed turning from your back to your side while in a flat bed without using bedrails?: A Little Help needed moving from lying on your back to sitting on the side of a flat bed without using bedrails?: A Little Help needed moving to and from a bed to a chair (including a wheelchair)?: A Little Help needed standing up from a chair using your arms (e.g., wheelchair or bedside chair)?: A Little Help needed to walk in hospital room?: A Little Help needed climbing 3-5 steps with a railing? : A Little 6 Click Score: 18    End of Session Equipment Utilized During Treatment: Left knee immobilizer Activity Tolerance: Patient tolerated treatment well Patient left: in chair;with call bell/phone within reach;with family/visitor present Nurse Communication: Mobility status PT Visit Diagnosis: Unsteadiness on feet (R26.81);Other abnormalities of gait and mobility (R26.89);Muscle weakness (generalized) (M62.81);Difficulty in walking, not elsewhere classified (R26.2);Pain Pain - Right/Left: Left Pain - part of body: Knee     Time: 8372-9021 PT Time Calculation (min) (ACUTE ONLY): 57 min  Charges:  $Gait Training: 23-37 mins $Therapeutic Activity: 23-37 mins                     [name] Niece, PT, DPT Acute Rehabilitation Pager: [phone] Office [phone]    [name] Niece 03/13/2021, 10:32 AM

## 2021-03-14 ENCOUNTER — Telehealth: Payer: Self-pay | Admitting: *Deleted

## 2021-03-14 DIAGNOSIS — E1159 Type 2 diabetes mellitus with other circulatory complications: Secondary | ICD-10-CM | POA: Diagnosis not present

## 2021-03-14 DIAGNOSIS — K449 Diaphragmatic hernia without obstruction or gangrene: Secondary | ICD-10-CM | POA: Diagnosis not present

## 2021-03-14 DIAGNOSIS — M1711 Unilateral primary osteoarthritis, right knee: Secondary | ICD-10-CM | POA: Diagnosis not present

## 2021-03-14 DIAGNOSIS — I2510 Atherosclerotic heart disease of native coronary artery without angina pectoris: Secondary | ICD-10-CM | POA: Diagnosis not present

## 2021-03-14 DIAGNOSIS — G8929 Other chronic pain: Secondary | ICD-10-CM | POA: Diagnosis not present

## 2021-03-14 DIAGNOSIS — E7800 Pure hypercholesterolemia, unspecified: Secondary | ICD-10-CM | POA: Diagnosis not present

## 2021-03-14 DIAGNOSIS — K429 Umbilical hernia without obstruction or gangrene: Secondary | ICD-10-CM | POA: Diagnosis not present

## 2021-03-14 DIAGNOSIS — D509 Iron deficiency anemia, unspecified: Secondary | ICD-10-CM | POA: Diagnosis not present

## 2021-03-14 DIAGNOSIS — E042 Nontoxic multinodular goiter: Secondary | ICD-10-CM | POA: Diagnosis not present

## 2021-03-14 DIAGNOSIS — G4710 Hypersomnia, unspecified: Secondary | ICD-10-CM | POA: Diagnosis not present

## 2021-03-14 DIAGNOSIS — E1142 Type 2 diabetes mellitus with diabetic polyneuropathy: Secondary | ICD-10-CM | POA: Diagnosis not present

## 2021-03-14 DIAGNOSIS — K219 Gastro-esophageal reflux disease without esophagitis: Secondary | ICD-10-CM | POA: Diagnosis not present

## 2021-03-14 DIAGNOSIS — F418 Other specified anxiety disorders: Secondary | ICD-10-CM | POA: Diagnosis not present

## 2021-03-14 DIAGNOSIS — M5116 Intervertebral disc disorders with radiculopathy, lumbar region: Secondary | ICD-10-CM | POA: Diagnosis not present

## 2021-03-14 DIAGNOSIS — G2581 Restless legs syndrome: Secondary | ICD-10-CM | POA: Diagnosis not present

## 2021-03-14 DIAGNOSIS — R911 Solitary pulmonary nodule: Secondary | ICD-10-CM | POA: Diagnosis not present

## 2021-03-14 DIAGNOSIS — J45909 Unspecified asthma, uncomplicated: Secondary | ICD-10-CM | POA: Diagnosis not present

## 2021-03-14 DIAGNOSIS — G4733 Obstructive sleep apnea (adult) (pediatric): Secondary | ICD-10-CM | POA: Diagnosis not present

## 2021-03-14 DIAGNOSIS — Z6834 Body mass index (BMI) 34.0-34.9, adult: Secondary | ICD-10-CM | POA: Diagnosis not present

## 2021-03-14 DIAGNOSIS — Z96652 Presence of left artificial knee joint: Secondary | ICD-10-CM | POA: Diagnosis not present

## 2021-03-14 DIAGNOSIS — Z471 Aftercare following joint replacement surgery: Secondary | ICD-10-CM | POA: Diagnosis not present

## 2021-03-14 DIAGNOSIS — E669 Obesity, unspecified: Secondary | ICD-10-CM | POA: Diagnosis not present

## 2021-03-14 DIAGNOSIS — G43909 Migraine, unspecified, not intractable, without status migrainosus: Secondary | ICD-10-CM | POA: Diagnosis not present

## 2021-03-14 DIAGNOSIS — I152 Hypertension secondary to endocrine disorders: Secondary | ICD-10-CM | POA: Diagnosis not present

## 2021-03-14 DIAGNOSIS — M797 Fibromyalgia: Secondary | ICD-10-CM | POA: Diagnosis not present

## 2021-03-14 NOTE — Telephone Encounter (Signed)
Ortho bundle D/C call completed. 

## 2021-03-15 DIAGNOSIS — J45909 Unspecified asthma, uncomplicated: Secondary | ICD-10-CM | POA: Diagnosis not present

## 2021-03-15 DIAGNOSIS — Z471 Aftercare following joint replacement surgery: Secondary | ICD-10-CM | POA: Diagnosis not present

## 2021-03-15 DIAGNOSIS — M1711 Unilateral primary osteoarthritis, right knee: Secondary | ICD-10-CM | POA: Diagnosis not present

## 2021-03-15 DIAGNOSIS — F418 Other specified anxiety disorders: Secondary | ICD-10-CM | POA: Diagnosis not present

## 2021-03-15 DIAGNOSIS — Z96652 Presence of left artificial knee joint: Secondary | ICD-10-CM | POA: Diagnosis not present

## 2021-03-15 DIAGNOSIS — K219 Gastro-esophageal reflux disease without esophagitis: Secondary | ICD-10-CM | POA: Diagnosis not present

## 2021-03-17 DIAGNOSIS — F418 Other specified anxiety disorders: Secondary | ICD-10-CM | POA: Diagnosis not present

## 2021-03-17 DIAGNOSIS — Z96652 Presence of left artificial knee joint: Secondary | ICD-10-CM | POA: Diagnosis not present

## 2021-03-17 DIAGNOSIS — K219 Gastro-esophageal reflux disease without esophagitis: Secondary | ICD-10-CM | POA: Diagnosis not present

## 2021-03-17 DIAGNOSIS — M1711 Unilateral primary osteoarthritis, right knee: Secondary | ICD-10-CM | POA: Diagnosis not present

## 2021-03-17 DIAGNOSIS — J45909 Unspecified asthma, uncomplicated: Secondary | ICD-10-CM | POA: Diagnosis not present

## 2021-03-17 DIAGNOSIS — Z471 Aftercare following joint replacement surgery: Secondary | ICD-10-CM | POA: Diagnosis not present

## 2021-03-19 DIAGNOSIS — F418 Other specified anxiety disorders: Secondary | ICD-10-CM | POA: Diagnosis not present

## 2021-03-19 DIAGNOSIS — Z96652 Presence of left artificial knee joint: Secondary | ICD-10-CM | POA: Diagnosis not present

## 2021-03-19 DIAGNOSIS — K219 Gastro-esophageal reflux disease without esophagitis: Secondary | ICD-10-CM | POA: Diagnosis not present

## 2021-03-19 DIAGNOSIS — M1711 Unilateral primary osteoarthritis, right knee: Secondary | ICD-10-CM | POA: Diagnosis not present

## 2021-03-19 DIAGNOSIS — J45909 Unspecified asthma, uncomplicated: Secondary | ICD-10-CM | POA: Diagnosis not present

## 2021-03-19 DIAGNOSIS — Z471 Aftercare following joint replacement surgery: Secondary | ICD-10-CM | POA: Diagnosis not present

## 2021-03-20 ENCOUNTER — Telehealth: Payer: Self-pay | Admitting: *Deleted

## 2021-03-20 ENCOUNTER — Other Ambulatory Visit: Payer: Self-pay | Admitting: Orthopaedic Surgery

## 2021-03-20 MED ORDER — OXYCODONE HCL 5 MG PO TABS: 5.0000 mg | ORAL_TABLET | Freq: Four times a day (QID) | ORAL | 0 refills | Status: DC | PRN

## 2021-03-20 NOTE — Telephone Encounter (Signed)
Ortho bundle 7 day call completed. Patient requesting refill of pain medication before the weekend. Thanks.

## 2021-03-21 DIAGNOSIS — F418 Other specified anxiety disorders: Secondary | ICD-10-CM | POA: Diagnosis not present

## 2021-03-21 DIAGNOSIS — J45909 Unspecified asthma, uncomplicated: Secondary | ICD-10-CM | POA: Diagnosis not present

## 2021-03-21 DIAGNOSIS — K219 Gastro-esophageal reflux disease without esophagitis: Secondary | ICD-10-CM | POA: Diagnosis not present

## 2021-03-21 DIAGNOSIS — Z96652 Presence of left artificial knee joint: Secondary | ICD-10-CM | POA: Diagnosis not present

## 2021-03-21 DIAGNOSIS — Z471 Aftercare following joint replacement surgery: Secondary | ICD-10-CM | POA: Diagnosis not present

## 2021-03-21 DIAGNOSIS — M1711 Unilateral primary osteoarthritis, right knee: Secondary | ICD-10-CM | POA: Diagnosis not present

## 2021-03-21 NOTE — Telephone Encounter (Signed)
Patient made aware of refill by MD.

## 2021-03-24 DIAGNOSIS — Z96652 Presence of left artificial knee joint: Secondary | ICD-10-CM | POA: Diagnosis not present

## 2021-03-24 DIAGNOSIS — Z471 Aftercare following joint replacement surgery: Secondary | ICD-10-CM | POA: Diagnosis not present

## 2021-03-24 DIAGNOSIS — F418 Other specified anxiety disorders: Secondary | ICD-10-CM | POA: Diagnosis not present

## 2021-03-24 DIAGNOSIS — J45909 Unspecified asthma, uncomplicated: Secondary | ICD-10-CM | POA: Diagnosis not present

## 2021-03-24 DIAGNOSIS — K219 Gastro-esophageal reflux disease without esophagitis: Secondary | ICD-10-CM | POA: Diagnosis not present

## 2021-03-24 DIAGNOSIS — M1711 Unilateral primary osteoarthritis, right knee: Secondary | ICD-10-CM | POA: Diagnosis not present

## 2021-03-25 ENCOUNTER — Ambulatory Visit (INDEPENDENT_AMBULATORY_CARE_PROVIDER_SITE_OTHER): Payer: Medicare Other | Admitting: Orthopaedic Surgery

## 2021-03-25 ENCOUNTER — Telehealth: Payer: Self-pay | Admitting: *Deleted

## 2021-03-25 ENCOUNTER — Encounter: Payer: Self-pay | Admitting: Orthopaedic Surgery

## 2021-03-25 ENCOUNTER — Other Ambulatory Visit: Payer: Self-pay

## 2021-03-25 DIAGNOSIS — Z96652 Presence of left artificial knee joint: Secondary | ICD-10-CM

## 2021-03-25 NOTE — Telephone Encounter (Signed)
Ortho bundle 14 day in office meeting completed.

## 2021-03-25 NOTE — Progress Notes (Signed)
The patient is here today for 2-week visit status post a left total knee arthroplasty.  She is 76 years old.  She has been able to flex her knee per the home therapist to 90 degrees.  She is ambulate with a walker and says she is doing well overall.  She does report better strength as well.  On exam I did remove the staples and placed Steri-Strips.  Her leg is swollen but her calf is soft.  She is on Plavix which she is on chronically.  She is starting [facility].  I would like to see her back in 4 weeks to see how she is doing overall but no x-rays are needed.  All questions and concerns were answered and addressed.

## 2021-03-26 DIAGNOSIS — R262 Difficulty in walking, not elsewhere classified: Secondary | ICD-10-CM | POA: Diagnosis not present

## 2021-03-26 DIAGNOSIS — M25562 Pain in left knee: Secondary | ICD-10-CM | POA: Diagnosis not present

## 2021-03-26 DIAGNOSIS — R531 Weakness: Secondary | ICD-10-CM | POA: Diagnosis not present

## 2021-03-26 DIAGNOSIS — M25662 Stiffness of left knee, not elsewhere classified: Secondary | ICD-10-CM | POA: Diagnosis not present

## 2021-03-26 DIAGNOSIS — Z96652 Presence of left artificial knee joint: Secondary | ICD-10-CM | POA: Diagnosis not present

## 2021-03-28 ENCOUNTER — Telehealth: Payer: Self-pay

## 2021-03-28 DIAGNOSIS — R531 Weakness: Secondary | ICD-10-CM | POA: Diagnosis not present

## 2021-03-28 DIAGNOSIS — M25562 Pain in left knee: Secondary | ICD-10-CM | POA: Diagnosis not present

## 2021-03-28 DIAGNOSIS — Z96652 Presence of left artificial knee joint: Secondary | ICD-10-CM | POA: Diagnosis not present

## 2021-03-28 DIAGNOSIS — R262 Difficulty in walking, not elsewhere classified: Secondary | ICD-10-CM | POA: Diagnosis not present

## 2021-03-28 DIAGNOSIS — M25662 Stiffness of left knee, not elsewhere classified: Secondary | ICD-10-CM | POA: Diagnosis not present

## 2021-03-28 NOTE — Telephone Encounter (Signed)
Transition Care Management Unsuccessful Follow-up Telephone Call  Date of discharge and from where:  03/13/2021  [facility]  Attempts:  1st Attempt  Reason for unsuccessful TCM follow-up call:  No answer/busy Tomasa Rand, RN, BSN, CEN [place] Coordinator [phone]

## 2021-03-31 DIAGNOSIS — Z96652 Presence of left artificial knee joint: Secondary | ICD-10-CM | POA: Diagnosis not present

## 2021-03-31 DIAGNOSIS — M25562 Pain in left knee: Secondary | ICD-10-CM | POA: Diagnosis not present

## 2021-03-31 DIAGNOSIS — R531 Weakness: Secondary | ICD-10-CM | POA: Diagnosis not present

## 2021-03-31 DIAGNOSIS — R262 Difficulty in walking, not elsewhere classified: Secondary | ICD-10-CM | POA: Diagnosis not present

## 2021-03-31 DIAGNOSIS — M25662 Stiffness of left knee, not elsewhere classified: Secondary | ICD-10-CM | POA: Diagnosis not present

## 2021-04-01 ENCOUNTER — Telehealth: Payer: Self-pay | Admitting: *Deleted

## 2021-04-01 ENCOUNTER — Encounter: Payer: Self-pay | Admitting: Internal Medicine

## 2021-04-01 NOTE — Telephone Encounter (Signed)
Transition Care Management Follow-up Telephone Call Date of discharge and from where: [facility] 03/13/21 How have you been since you were released from the hospital? "Much better" Any questions or concerns? No  Items Reviewed: Did the pt receive and understand the discharge instructions provided? Yes  Medications obtained and verified? Yes  Other? No  Any new allergies since your discharge? No  Dietary orders reviewed? Yes Do you have support at home? Yes   Home Care and Equipment/Supplies: Were home health services ordered? yes If so, what is the name of the agency? [facility] has discharged and now going to outpatient rehab  Has the agency set up a time to come to the patient's home? yes Were any new equipment or medical supplies ordered?  Yes: walker and BSC What is the name of the medical supply agency? N/a Were you able to get the supplies/equipment? yes Do you have any questions related to the use of the equipment or supplies? No  Functional Questionnaire: (I = Independent and D = Dependent) ADLs: I  Bathing/Dressing- I  Meal Prep- I  Eating- I  Maintaining continence- I  Transferring/Ambulation- D  uses cane  Managing Meds- I  Follow up appointments reviewed:  PCP Hospital f/u appt confirmed?  No Specialist Hospital f/u appt confirmed? Yes  Saw Dr. Ninfa Linden on 03/25/21 Are transportation arrangements needed? No  If their condition worsens, is the pt aware to call PCP or go to the Emergency Dept.? Yes Was the patient provided with contact information for the PCP's office or ED? Yes Was to pt encouraged to call back with questions or concerns? Yes  Jacqlyn Larsen [facility], BSN RN Case Manager [phone]

## 2021-04-02 DIAGNOSIS — M25662 Stiffness of left knee, not elsewhere classified: Secondary | ICD-10-CM | POA: Diagnosis not present

## 2021-04-02 DIAGNOSIS — R531 Weakness: Secondary | ICD-10-CM | POA: Diagnosis not present

## 2021-04-02 DIAGNOSIS — R262 Difficulty in walking, not elsewhere classified: Secondary | ICD-10-CM | POA: Diagnosis not present

## 2021-04-02 DIAGNOSIS — M25562 Pain in left knee: Secondary | ICD-10-CM | POA: Diagnosis not present

## 2021-04-02 DIAGNOSIS — Z96652 Presence of left artificial knee joint: Secondary | ICD-10-CM | POA: Diagnosis not present

## 2021-04-03 ENCOUNTER — Encounter: Payer: Self-pay | Admitting: Internal Medicine

## 2021-04-04 ENCOUNTER — Telehealth: Payer: Self-pay | Admitting: *Deleted

## 2021-04-04 ENCOUNTER — Other Ambulatory Visit: Payer: Self-pay | Admitting: Orthopaedic Surgery

## 2021-04-04 DIAGNOSIS — M25662 Stiffness of left knee, not elsewhere classified: Secondary | ICD-10-CM | POA: Diagnosis not present

## 2021-04-04 DIAGNOSIS — R531 Weakness: Secondary | ICD-10-CM | POA: Diagnosis not present

## 2021-04-04 DIAGNOSIS — R262 Difficulty in walking, not elsewhere classified: Secondary | ICD-10-CM | POA: Diagnosis not present

## 2021-04-04 DIAGNOSIS — Z96652 Presence of left artificial knee joint: Secondary | ICD-10-CM | POA: Diagnosis not present

## 2021-04-04 DIAGNOSIS — M25562 Pain in left knee: Secondary | ICD-10-CM | POA: Diagnosis not present

## 2021-04-04 MED ORDER — OXYCODONE HCL 5 MG PO TABS: 5.0000 mg | ORAL_TABLET | Freq: Four times a day (QID) | ORAL | 0 refills | Status: DC | PRN

## 2021-04-04 NOTE — Telephone Encounter (Signed)
Patient call requesting refill of pain medication. We discussed that she really isn't resting and unable to perform at home therapy exercises due to pain. Only taking 1 pain medication daily on therapy days. Informed she may need to consider taking on days she is not at therapy, so she can get some rest as well as do her home exercises. She is only 3 weeks post op.

## 2021-04-04 NOTE — Telephone Encounter (Signed)
Patient aware of refill sent to her pharmacy.

## 2021-04-07 DIAGNOSIS — M25662 Stiffness of left knee, not elsewhere classified: Secondary | ICD-10-CM | POA: Diagnosis not present

## 2021-04-07 DIAGNOSIS — R531 Weakness: Secondary | ICD-10-CM | POA: Diagnosis not present

## 2021-04-07 DIAGNOSIS — Z96652 Presence of left artificial knee joint: Secondary | ICD-10-CM | POA: Diagnosis not present

## 2021-04-07 DIAGNOSIS — M25562 Pain in left knee: Secondary | ICD-10-CM | POA: Diagnosis not present

## 2021-04-07 DIAGNOSIS — R262 Difficulty in walking, not elsewhere classified: Secondary | ICD-10-CM | POA: Diagnosis not present

## 2021-04-09 DIAGNOSIS — M25662 Stiffness of left knee, not elsewhere classified: Secondary | ICD-10-CM | POA: Diagnosis not present

## 2021-04-09 DIAGNOSIS — M25562 Pain in left knee: Secondary | ICD-10-CM | POA: Diagnosis not present

## 2021-04-09 DIAGNOSIS — R262 Difficulty in walking, not elsewhere classified: Secondary | ICD-10-CM | POA: Diagnosis not present

## 2021-04-09 DIAGNOSIS — Z96652 Presence of left artificial knee joint: Secondary | ICD-10-CM | POA: Diagnosis not present

## 2021-04-09 DIAGNOSIS — R531 Weakness: Secondary | ICD-10-CM | POA: Diagnosis not present

## 2021-04-11 DIAGNOSIS — R531 Weakness: Secondary | ICD-10-CM | POA: Diagnosis not present

## 2021-04-11 DIAGNOSIS — M25662 Stiffness of left knee, not elsewhere classified: Secondary | ICD-10-CM | POA: Diagnosis not present

## 2021-04-11 DIAGNOSIS — Z96652 Presence of left artificial knee joint: Secondary | ICD-10-CM | POA: Diagnosis not present

## 2021-04-11 DIAGNOSIS — R262 Difficulty in walking, not elsewhere classified: Secondary | ICD-10-CM | POA: Diagnosis not present

## 2021-04-11 DIAGNOSIS — M25562 Pain in left knee: Secondary | ICD-10-CM | POA: Diagnosis not present

## 2021-04-13 ENCOUNTER — Other Ambulatory Visit: Payer: Self-pay | Admitting: Internal Medicine

## 2021-04-13 ENCOUNTER — Other Ambulatory Visit (HOSPITAL_COMMUNITY): Payer: Self-pay | Admitting: Cardiology

## 2021-04-13 DIAGNOSIS — K219 Gastro-esophageal reflux disease without esophagitis: Secondary | ICD-10-CM

## 2021-04-14 ENCOUNTER — Other Ambulatory Visit: Payer: Self-pay

## 2021-04-14 ENCOUNTER — Ambulatory Visit (INDEPENDENT_AMBULATORY_CARE_PROVIDER_SITE_OTHER): Payer: Medicare Other | Admitting: Family Medicine

## 2021-04-14 ENCOUNTER — Encounter (INDEPENDENT_AMBULATORY_CARE_PROVIDER_SITE_OTHER): Payer: Self-pay | Admitting: Family Medicine

## 2021-04-14 VITALS — BP 107/66 | HR 59 | Temp 98.5°F | Ht 65.0 in | Wt 205.0 lb

## 2021-04-14 DIAGNOSIS — D519 Vitamin B12 deficiency anemia, unspecified: Secondary | ICD-10-CM | POA: Diagnosis not present

## 2021-04-14 DIAGNOSIS — D510 Vitamin B12 deficiency anemia due to intrinsic factor deficiency: Secondary | ICD-10-CM | POA: Diagnosis not present

## 2021-04-14 DIAGNOSIS — E559 Vitamin D deficiency, unspecified: Secondary | ICD-10-CM

## 2021-04-14 DIAGNOSIS — E041 Nontoxic single thyroid nodule: Secondary | ICD-10-CM

## 2021-04-14 DIAGNOSIS — E7849 Other hyperlipidemia: Secondary | ICD-10-CM

## 2021-04-14 DIAGNOSIS — R6889 Other general symptoms and signs: Secondary | ICD-10-CM

## 2021-04-14 DIAGNOSIS — E669 Obesity, unspecified: Secondary | ICD-10-CM | POA: Diagnosis not present

## 2021-04-14 DIAGNOSIS — E119 Type 2 diabetes mellitus without complications: Secondary | ICD-10-CM | POA: Diagnosis not present

## 2021-04-14 DIAGNOSIS — Z6834 Body mass index (BMI) 34.0-34.9, adult: Secondary | ICD-10-CM

## 2021-04-14 DIAGNOSIS — R5383 Other fatigue: Secondary | ICD-10-CM | POA: Diagnosis not present

## 2021-04-14 NOTE — Progress Notes (Signed)
Chief Complaint:   OBESITY Heather [name] is here to discuss her progress with her obesity treatment plan along with follow-up of her obesity related diagnoses. Heather [name] is on the Category 2 Plan and states she is following her eating plan approximately 20% of the time. Heather [name] states she is doing 0 minutes 0 times per week.  Today's visit was #: 55 Starting weight: 268 lbs Starting date: 12/23/2016 Today's weight: 205 lbs Today's date: 04/14/2021 Total lbs lost to date: 44 Total lbs lost since last in-office visit: 4  Interim History: Heather [name] continues to do well with weight loss. She has her left knee replacement and she is healing well. She has been doing her physical therapy and she is meeting her healthy milestones. She has been mindful of her food choices and she has been trying to increase her protein. She is doing more portion control and making smarter choices.   Subjective:   1. Other hyperlipidemia Heather [name] continues to work on diet and weight loss. She is stable on her medications/ last LDL was at goal at 73.  2. Cold intolerance Heather [name] notes feeling very cold while her family is comfortable. This has been worse since her knee replacement surgery. She is healing well from this with no signs of F/C.   3. Vitamin D deficiency Heather [name] is on Vit D, but her level is not yet at goal, and she notes fatigue.  4. Pernicious anemia Heather [name] is on B6 and B12, and she is due for labs. She notes cold intolerance.  5. Thyroid nodule Heather [name] has a history of thyroid nodule and worsening cold intolerance.   Assessment/Plan:   1. Other hyperlipidemia Cardiovascular risk and specific lipid/LDL goals reviewed. We discussed several lifestyle modifications today. Heather [name] will continue with her diet, exercise, and weight loss efforts. Orders and follow up as documented in patient record.   2. Cold intolerance We will check labs today, and we will follow up at Heather [name] next office visit.  - TSH -  CMP14+EGFR  3. Vitamin D deficiency Low Vitamin D level contributes to fatigue and are associated with obesity, breast, and colon cancer. We will check labs today. Heather [name] will follow-up for routine testing of Vitamin D, at least 2-3 times per year to avoid over-replacement.  - VITAMIN D 25 Hydroxy (Vit-D Deficiency, Fractures)  4. Pernicious anemia We will check labs today, and we will follow up at [name] next office visit. Orders and follow up as documented in patient record.  - Folate - Iron and TIBC - Ferritin - CBC with Differential/Platelet - Hemoglobin A1c  5. Thyroid nodule We will check labs today, and we will follow up at Heather [name]'s next office visit. Orders and follow up as documented in patient record.  - TSH  6. Obesity with current BMI 34.2 Heather [name] is currently in the action stage of change. As such, her goal is to continue with weight loss efforts. She has agreed to the Category 2 Plan or practicing portion control and making smarter food choices, such as increasing vegetables and decreasing simple carbohydrates.   Behavioral modification strategies: increasing lean protein intake.  Heather [name] has agreed to follow-up with our clinic in 4 weeks. She was informed of the importance of frequent follow-up visits to maximize her success with intensive lifestyle modifications for her multiple health conditions.   Heather [name] was informed we would discuss her lab results at her next visit unless there is a critical issue that needs to be addressed sooner. Heather [name] agreed to keep  her next visit at the agreed upon time to discuss these results.  Objective:   Blood pressure 107/66, pulse (!) 59, temperature 98.5 F (36.9 C), height 5' 5"  (1.651 m), weight 205 lb (93 kg), SpO2 94 %. Body mass index is 34.11 kg/m.  General: Cooperative, alert, well developed, in no acute distress. HEENT: Conjunctivae and lids unremarkable. Cardiovascular: Regular rhythm.  Lungs: Normal work of  breathing. Neurologic: No focal deficits.   Lab Results  Component Value Date   CREATININE 0.79 03/12/2021   BUN 9 03/12/2021   NA 134 (L) 03/12/2021   K 3.5 03/12/2021   CL 102 03/12/2021   CO2 23 03/12/2021   Lab Results  Component Value Date   ALT 19 03/07/2021   AST 24 03/07/2021   ALKPHOS 56 03/07/2021   BILITOT 0.5 03/07/2021   Lab Results  Component Value Date   HGBA1C 5.8 (H) 03/07/2021   HGBA1C 6.1 (H) 11/25/2020   HGBA1C 5.9 06/21/2020   HGBA1C 5.8 (H) 04/30/2020   HGBA1C 5.6 10/09/2019   Lab Results  Component Value Date   INSULIN 6.0 11/25/2020   INSULIN 4.3 10/09/2019   INSULIN 8.6 07/05/2019   INSULIN 6.4 12/05/2018   INSULIN 3.6 04/27/2018   Lab Results  Component Value Date   TSH 2.23 06/21/2020   Lab Results  Component Value Date   CHOL 149 11/25/2020   HDL 62 11/25/2020   LDLCALC 73 11/25/2020   TRIG 73 11/25/2020   CHOLHDL 2.1 07/17/2020   Lab Results  Component Value Date   VD25OH 66.2 11/25/2020   VD25OH 58.1 04/30/2020   VD25OH 42.0 10/09/2019   Lab Results  Component Value Date   WBC 7.4 03/12/2021   HGB 12.5 03/12/2021   HCT 38.4 03/12/2021   MCV 95.5 03/12/2021   PLT 185 03/12/2021   Lab Results  Component Value Date   IRON 91 12/07/2019   TIBC 255 12/07/2019   FERRITIN 147 12/07/2019    Obesity Behavioral Intervention:   Approximately 15 minutes were spent on the discussion below.  ASK: We discussed the diagnosis of obesity with Heather [name] today and Heather [name] agreed to give [name] permission to discuss obesity behavioral modification therapy today.  ASSESS: Heather [name] has the diagnosis of obesity and her BMI today is 34.2. Heather [name] is in the action stage of change.   ADVISE: Heather [name] was educated on the multiple health risks of obesity as well as the benefit of weight loss to improve her health. She was advised of the need for long term treatment and the importance of lifestyle modifications to improve her current health and to decrease  her risk of future health problems.  AGREE: Multiple dietary modification options and treatment options were discussed and Heather [name] agreed to follow the recommendations documented in the above note.  ARRANGE: Ambrosia was educated on the importance of frequent visits to treat obesity as outlined per CMS and USPSTF guidelines and agreed to schedule her next follow up appointment today.  Attestation Statements:   Reviewed by clinician on day of visit: allergies, medications, problem list, medical history, surgical history, family history, social history, and previous encounter notes.   I, [name], am acting as transcriptionist for [name], MD.  I have reviewed the above documentation for accuracy and completeness, and I agree with the above. -  [name], MD

## 2021-04-15 ENCOUNTER — Ambulatory Visit (INDEPENDENT_AMBULATORY_CARE_PROVIDER_SITE_OTHER): Payer: Medicare Other | Admitting: Podiatry

## 2021-04-15 ENCOUNTER — Encounter: Payer: Self-pay | Admitting: Podiatry

## 2021-04-15 DIAGNOSIS — D2372 Other benign neoplasm of skin of left lower limb, including hip: Secondary | ICD-10-CM

## 2021-04-15 DIAGNOSIS — B351 Tinea unguium: Secondary | ICD-10-CM

## 2021-04-15 DIAGNOSIS — D689 Coagulation defect, unspecified: Secondary | ICD-10-CM

## 2021-04-15 DIAGNOSIS — M79676 Pain in unspecified toe(s): Secondary | ICD-10-CM

## 2021-04-15 DIAGNOSIS — D2371 Other benign neoplasm of skin of right lower limb, including hip: Secondary | ICD-10-CM

## 2021-04-15 LAB — CMP14+EGFR
ALT: 10 IU/L (ref 0–32)
AST: 23 IU/L (ref 0–40)
Albumin/Globulin Ratio: 1.1 — ABNORMAL LOW (ref 1.2–2.2)
Albumin: 3.9 g/dL (ref 3.7–4.7)
Alkaline Phosphatase: 76 IU/L (ref 44–121)
BUN/Creatinine Ratio: 16 (ref 12–28)
BUN: 14 mg/dL (ref 8–27)
Bilirubin Total: 0.2 mg/dL (ref 0.0–1.2)
CO2: 25 mmol/L (ref 20–29)
Calcium: 9.3 mg/dL (ref 8.7–10.3)
Chloride: 105 mmol/L (ref 96–106)
Creatinine, Ser: 0.87 mg/dL (ref 0.57–1.00)
Globulin, Total: 3.4 g/dL (ref 1.5–4.5)
Glucose: 100 mg/dL — ABNORMAL HIGH (ref 70–99)
Potassium: 3.7 mmol/L (ref 3.5–5.2)
Sodium: 143 mmol/L (ref 134–144)
Total Protein: 7.3 g/dL (ref 6.0–8.5)
eGFR: 70 mL/min/{1.73_m2} (ref >59)

## 2021-04-15 LAB — CBC WITH DIFFERENTIAL/PLATELET
Basophils Absolute: 0 10*3/uL (ref 0.0–0.2)
Basos: 1 %
EOS (ABSOLUTE): 0.1 10*3/uL (ref 0.0–0.4)
Eos: 2 %
Hematocrit: 38.3 % (ref 34.0–46.6)
Hemoglobin: 12 g/dL (ref 11.1–15.9)
Immature Grans (Abs): 0 10*3/uL (ref 0.0–0.1)
Immature Granulocytes: 0 %
Lymphocytes Absolute: 1 10*3/uL (ref 0.7–3.1)
Lymphs: 27 %
MCH: 29.6 pg (ref 26.6–33.0)
MCHC: 31.3 g/dL — ABNORMAL LOW (ref 31.5–35.7)
MCV: 94 fL (ref 79–97)
Monocytes Absolute: 0.3 10*3/uL (ref 0.1–0.9)
Monocytes: 9 %
Neutrophils Absolute: 2.2 10*3/uL (ref 1.4–7.0)
Neutrophils: 61 %
Platelets: 238 10*3/uL (ref 150–450)
RBC: 4.06 x10E6/uL (ref 3.77–5.28)
RDW: 13.8 % (ref 11.7–15.4)
WBC: 3.6 10*3/uL (ref 3.4–10.8)

## 2021-04-15 LAB — IRON AND TIBC
Iron Saturation: 18 % (ref 15–55)
Iron: 42 ug/dL (ref 27–139)
Total Iron Binding Capacity: 231 ug/dL — ABNORMAL LOW (ref 250–450)
UIBC: 189 ug/dL (ref 118–369)

## 2021-04-15 LAB — FOLATE: Folate: >20 ng/mL (ref >3.0)

## 2021-04-15 LAB — HEMOGLOBIN A1C
Est. average glucose Bld gHb Est-mCnc: 105 mg/dL
Hgb A1c MFr Bld: 5.3 % (ref 4.8–5.6)

## 2021-04-15 LAB — TSH: TSH: 1.77 u[IU]/mL (ref 0.450–4.500)

## 2021-04-15 LAB — FERRITIN: Ferritin: 338 ng/mL — ABNORMAL HIGH (ref 15–150)

## 2021-04-15 LAB — VITAMIN D 25 HYDROXY (VIT D DEFICIENCY, FRACTURES): Vit D, 25-Hydroxy: 55.3 ng/mL (ref 30.0–100.0)

## 2021-04-15 NOTE — Progress Notes (Signed)
She presents today with her husband after having her total knee repaired 5 weeks ago left.  She is complaining of painful elongated toenails.  Objective: Vital signs stable and oriented x3 there is minimal edema to the left leg she does have a healing decubitus ulcer on the left heel currently not demonstrating any type of open lesion.  Otherwise toenails are long thick yellow dystrophic clinical mycotic.  Assessment: Decubitus ulcer left heel healing.  Pain in limb secondary to onychomycosis.  Plan: Debridement of toenails 1 through 5 bilateral.  Padded the heel today and instructed she and her husband to make sure that they float the heel on a pillow to allow this to go on to heal up as much as possible.

## 2021-04-16 DIAGNOSIS — Z96652 Presence of left artificial knee joint: Secondary | ICD-10-CM | POA: Diagnosis not present

## 2021-04-16 DIAGNOSIS — M25562 Pain in left knee: Secondary | ICD-10-CM | POA: Diagnosis not present

## 2021-04-16 DIAGNOSIS — R262 Difficulty in walking, not elsewhere classified: Secondary | ICD-10-CM | POA: Diagnosis not present

## 2021-04-16 DIAGNOSIS — M25662 Stiffness of left knee, not elsewhere classified: Secondary | ICD-10-CM | POA: Diagnosis not present

## 2021-04-16 DIAGNOSIS — R531 Weakness: Secondary | ICD-10-CM | POA: Diagnosis not present

## 2021-04-21 ENCOUNTER — Ambulatory Visit (INDEPENDENT_AMBULATORY_CARE_PROVIDER_SITE_OTHER): Payer: Medicare Other | Admitting: Orthopaedic Surgery

## 2021-04-21 ENCOUNTER — Encounter: Payer: Self-pay | Admitting: Orthopaedic Surgery

## 2021-04-21 ENCOUNTER — Other Ambulatory Visit: Payer: Self-pay

## 2021-04-21 DIAGNOSIS — Z96652 Presence of left artificial knee joint: Secondary | ICD-10-CM

## 2021-04-21 MED ORDER — HYDROCODONE-ACETAMINOPHEN 5-325 MG PO TABS: 1.0000 | ORAL_TABLET | Freq: Four times a day (QID) | ORAL | 0 refills | Status: DC | PRN

## 2021-04-21 NOTE — Progress Notes (Signed)
The patient is now 6 weeks status post a left total knee arthroplasty.  She reports improved range of motion and strength.  The knee is still tight but overall she is making progress.  Her extension is almost full of her left knee and her flexion is 95 degrees.  There is still swelling to be expected.  Her calf is soft.  She did show me a pressure sore on the bottom of her left heel that looks like that is improving.  She is ambulating with a cane.  She has stopped oxycodone.  She does need occasional hydrocodone.  She understands that she can drop from my standpoint now once she does not have a narcotic such as hydrocodone in her system.  She will continue outpatient therapy as well.  I will see her back in 4 weeks to see how she is doing overall from a mobility standpoint.  No x-rays are needed.  All questions and concerns were answered and addressed.

## 2021-04-22 ENCOUNTER — Encounter (INDEPENDENT_AMBULATORY_CARE_PROVIDER_SITE_OTHER): Payer: Self-pay | Admitting: Family Medicine

## 2021-04-22 NOTE — Telephone Encounter (Signed)
Please review and advise.

## 2021-04-23 DIAGNOSIS — R531 Weakness: Secondary | ICD-10-CM | POA: Diagnosis not present

## 2021-04-23 DIAGNOSIS — M25662 Stiffness of left knee, not elsewhere classified: Secondary | ICD-10-CM | POA: Diagnosis not present

## 2021-04-23 DIAGNOSIS — Z96652 Presence of left artificial knee joint: Secondary | ICD-10-CM | POA: Diagnosis not present

## 2021-04-23 DIAGNOSIS — R262 Difficulty in walking, not elsewhere classified: Secondary | ICD-10-CM | POA: Diagnosis not present

## 2021-04-23 DIAGNOSIS — M25562 Pain in left knee: Secondary | ICD-10-CM | POA: Diagnosis not present

## 2021-04-24 DIAGNOSIS — M25662 Stiffness of left knee, not elsewhere classified: Secondary | ICD-10-CM | POA: Diagnosis not present

## 2021-04-24 DIAGNOSIS — Z96652 Presence of left artificial knee joint: Secondary | ICD-10-CM | POA: Diagnosis not present

## 2021-04-24 DIAGNOSIS — M25562 Pain in left knee: Secondary | ICD-10-CM | POA: Diagnosis not present

## 2021-04-24 DIAGNOSIS — R262 Difficulty in walking, not elsewhere classified: Secondary | ICD-10-CM | POA: Diagnosis not present

## 2021-04-24 DIAGNOSIS — R531 Weakness: Secondary | ICD-10-CM | POA: Diagnosis not present

## 2021-04-25 ENCOUNTER — Telehealth: Payer: Self-pay | Admitting: *Deleted

## 2021-04-25 ENCOUNTER — Other Ambulatory Visit: Payer: Self-pay | Admitting: Orthopaedic Surgery

## 2021-04-25 MED ORDER — TRAMADOL HCL 50 MG PO TABS: 50.0000 mg | ORAL_TABLET | Freq: Four times a day (QID) | ORAL | 0 refills | Status: DC | PRN

## 2021-04-25 NOTE — Telephone Encounter (Signed)
Patient called and states her pharmacy cannot fill the Hydrocodone Rx sent in earlier this week due to stock issues. She never picked this up. Apparently all pharmacies are having difficulty with stock of [place]. She asked if there was something else she could take. She has taken Tramadol in the past and states she has had good results with this. Pharmacy-[facility]-in chart. Thanks.

## 2021-04-25 NOTE — Telephone Encounter (Signed)
Call to patient and left VM updating on changed Rx to Tramadol sent to her pharmacy.

## 2021-04-29 ENCOUNTER — Other Ambulatory Visit: Payer: Self-pay | Admitting: Family Medicine

## 2021-05-01 DIAGNOSIS — R531 Weakness: Secondary | ICD-10-CM | POA: Diagnosis not present

## 2021-05-01 DIAGNOSIS — R262 Difficulty in walking, not elsewhere classified: Secondary | ICD-10-CM | POA: Diagnosis not present

## 2021-05-01 DIAGNOSIS — M25662 Stiffness of left knee, not elsewhere classified: Secondary | ICD-10-CM | POA: Diagnosis not present

## 2021-05-01 DIAGNOSIS — Z96652 Presence of left artificial knee joint: Secondary | ICD-10-CM | POA: Diagnosis not present

## 2021-05-01 DIAGNOSIS — M25562 Pain in left knee: Secondary | ICD-10-CM | POA: Diagnosis not present

## 2021-05-02 DIAGNOSIS — M25562 Pain in left knee: Secondary | ICD-10-CM | POA: Diagnosis not present

## 2021-05-02 DIAGNOSIS — R531 Weakness: Secondary | ICD-10-CM | POA: Diagnosis not present

## 2021-05-02 DIAGNOSIS — R262 Difficulty in walking, not elsewhere classified: Secondary | ICD-10-CM | POA: Diagnosis not present

## 2021-05-02 DIAGNOSIS — Z96652 Presence of left artificial knee joint: Secondary | ICD-10-CM | POA: Diagnosis not present

## 2021-05-02 DIAGNOSIS — M25662 Stiffness of left knee, not elsewhere classified: Secondary | ICD-10-CM | POA: Diagnosis not present

## 2021-05-05 DIAGNOSIS — R262 Difficulty in walking, not elsewhere classified: Secondary | ICD-10-CM | POA: Diagnosis not present

## 2021-05-05 DIAGNOSIS — R531 Weakness: Secondary | ICD-10-CM | POA: Diagnosis not present

## 2021-05-05 DIAGNOSIS — Z96652 Presence of left artificial knee joint: Secondary | ICD-10-CM | POA: Diagnosis not present

## 2021-05-05 DIAGNOSIS — M25662 Stiffness of left knee, not elsewhere classified: Secondary | ICD-10-CM | POA: Diagnosis not present

## 2021-05-05 DIAGNOSIS — M25562 Pain in left knee: Secondary | ICD-10-CM | POA: Diagnosis not present

## 2021-05-09 DIAGNOSIS — Z96652 Presence of left artificial knee joint: Secondary | ICD-10-CM | POA: Diagnosis not present

## 2021-05-09 DIAGNOSIS — R531 Weakness: Secondary | ICD-10-CM | POA: Diagnosis not present

## 2021-05-09 DIAGNOSIS — M25662 Stiffness of left knee, not elsewhere classified: Secondary | ICD-10-CM | POA: Diagnosis not present

## 2021-05-09 DIAGNOSIS — M25562 Pain in left knee: Secondary | ICD-10-CM | POA: Diagnosis not present

## 2021-05-09 DIAGNOSIS — R262 Difficulty in walking, not elsewhere classified: Secondary | ICD-10-CM | POA: Diagnosis not present

## 2021-05-13 ENCOUNTER — Encounter (INDEPENDENT_AMBULATORY_CARE_PROVIDER_SITE_OTHER): Payer: Self-pay | Admitting: Family Medicine

## 2021-05-13 ENCOUNTER — Ambulatory Visit (INDEPENDENT_AMBULATORY_CARE_PROVIDER_SITE_OTHER): Payer: Medicare Other | Admitting: Family Medicine

## 2021-05-13 ENCOUNTER — Other Ambulatory Visit: Payer: Self-pay

## 2021-05-13 VITALS — BP 116/66 | HR 80 | Temp 98.3°F | Ht 65.0 in | Wt 207.0 lb

## 2021-05-13 DIAGNOSIS — M25562 Pain in left knee: Secondary | ICD-10-CM | POA: Diagnosis not present

## 2021-05-13 DIAGNOSIS — E669 Obesity, unspecified: Secondary | ICD-10-CM

## 2021-05-13 DIAGNOSIS — E7849 Other hyperlipidemia: Secondary | ICD-10-CM

## 2021-05-13 DIAGNOSIS — E559 Vitamin D deficiency, unspecified: Secondary | ICD-10-CM | POA: Diagnosis not present

## 2021-05-13 DIAGNOSIS — Z96652 Presence of left artificial knee joint: Secondary | ICD-10-CM | POA: Diagnosis not present

## 2021-05-13 DIAGNOSIS — R262 Difficulty in walking, not elsewhere classified: Secondary | ICD-10-CM | POA: Diagnosis not present

## 2021-05-13 DIAGNOSIS — E1169 Type 2 diabetes mellitus with other specified complication: Secondary | ICD-10-CM

## 2021-05-13 DIAGNOSIS — Z6834 Body mass index (BMI) 34.0-34.9, adult: Secondary | ICD-10-CM

## 2021-05-13 DIAGNOSIS — M25662 Stiffness of left knee, not elsewhere classified: Secondary | ICD-10-CM | POA: Diagnosis not present

## 2021-05-13 DIAGNOSIS — R531 Weakness: Secondary | ICD-10-CM | POA: Diagnosis not present

## 2021-05-13 DIAGNOSIS — E119 Type 2 diabetes mellitus without complications: Secondary | ICD-10-CM

## 2021-05-15 DIAGNOSIS — Z96652 Presence of left artificial knee joint: Secondary | ICD-10-CM | POA: Diagnosis not present

## 2021-05-15 DIAGNOSIS — M25662 Stiffness of left knee, not elsewhere classified: Secondary | ICD-10-CM | POA: Diagnosis not present

## 2021-05-15 DIAGNOSIS — R531 Weakness: Secondary | ICD-10-CM | POA: Diagnosis not present

## 2021-05-15 DIAGNOSIS — R262 Difficulty in walking, not elsewhere classified: Secondary | ICD-10-CM | POA: Diagnosis not present

## 2021-05-15 DIAGNOSIS — M25562 Pain in left knee: Secondary | ICD-10-CM | POA: Diagnosis not present

## 2021-05-19 NOTE — Progress Notes (Signed)
? ? ? ?Chief Complaint:  ? ?OBESITY ?Heather [name] is here to discuss her progress with her obesity treatment plan along with follow-up of her obesity related diagnoses. Heather [name] is on the Category 2 Plan or practicing portion control and making smarter food choices, such as increasing vegetables and decreasing simple carbohydrates and states she is following her eating plan approximately 25% of the time. Heather [name] states she is doing physical therapy for 60 minutes 2-3 times per week. ? ?Today's visit was #: 35 ?Starting weight: 268 lbs ?Starting date: 12/23/2016 ?Today's weight: 207 lbs ?Today's date: 05/13/2021 ?Total lbs lost to date: 63 ?Total lbs lost since last in-office visit: 0 ? 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 She is skipping meals, stress eating, and not eating the things she should not be eating. ? ?Subjective:  ? ?1. Vitamin D deficiency ?Heather [name] is currently taking prescription vitamin D 50,000 IU each week. She denies nausea, vomiting or muscle weakness. ? ?2. Left knee pain, unspecified chronicity ?Heather [name] is going to physical therapy 2-3 days per week.  ? ?3. Type 2 diabetes mellitus without complication, without long-term current use of insulin (Heather [name]) ?Heather [name]'s last A1c looked better.  ? ?4. Other hyperlipidemia ?Heather [name] is on Crestor 40 mg, and she denies side effects.  ? ?Assessment/Plan:  ? ?1. Vitamin D deficiency ?Heather [name] will continue prescription Vitamin D 50,000 IU every week and will follow-up for routine testing of Vitamin D, at least 2-3 times per year to avoid over-replacement. ? ?2. Left knee pain, unspecified chronicity ?Heather [name] will continue her physical therapy. ? ?3. Type 2 diabetes mellitus without complication, without long-term current use of insulin (Heather [name]) ?Heather [name] will continue to work on dietary changes, physical therapy, and weight loss. Good blood sugar control is important to decrease the likelihood of diabetic complications such  as nephropathy, neuropathy, limb loss, blindness, coronary artery disease, and death. Intensive lifestyle modification including diet, exercise and weight loss are the first line of treatment for diabetes.  ? ?4. Other hyperlipidemia ?Heather [name] risk and specific lipid/LDL goals reviewed. We discussed several lifestyle modifications today. Heather [name] will continue to follow up with her PCP, and continue her medications as directed. She will continue to work on dietary changes, physical therapy, and weight loss efforts. Orders and follow up as documented in patient record.  ? ?5. Obesity with current BMI 34.5 ?Heather [name] is currently in the action stage of change. As such, her goal is to continue with weight loss efforts. She has agreed to the Category 2 Plan.  ? ?Exercise goals: As is. ? ?Behavioral modification strategies: increasing lean protein intake, increasing water intake, and no skipping meals. ? ?Heather [name] has agreed to follow-up with our clinic in 4 weeks. She was informed of the importance of frequent follow-up visits to maximize her success with intensive lifestyle modifications for her multiple health conditions.  ? ?Objective:  ? ?Blood pressure 116/66, pulse 80, temperature 98.3 ?F (36.8 ?C), height 5' 5"  (1.651 m), weight 207 lb (93.9 kg), SpO2 97 %. ?Body mass index is 34.45 kg/m?. ? ?General: Cooperative, alert, well developed, in no acute distress. ?HEENT: Conjunctivae and lids unremarkable. ?Heather [name]: Regular rhythm.  ?Lungs: Normal work of breathing. ?Neurologic: No focal deficits.  ? ?Lab Results  ?Component Value Date  ? CREATININE 0.87 04/14/2021  ? BUN 14 04/14/2021  ? NA 143 04/14/2021  ? K 3.7 04/14/2021  ? CL 105 04/14/2021  ? CO2 25 04/14/2021  ? ?Lab  Results  ?Component Value Date  ? ALT 10 04/14/2021  ? AST 23 04/14/2021  ? ALKPHOS 76 04/14/2021  ? BILITOT 0.2 04/14/2021  ? ?Lab Results  ?Component Value Date  ? HGBA1C 5.3 04/14/2021  ? HGBA1C 5.8 (H) 03/07/2021  ? HGBA1C 6.1 (H)  11/25/2020  ? HGBA1C 5.9 06/21/2020  ? HGBA1C 5.8 (H) 04/30/2020  ? ?Lab Results  ?Component Value Date  ? INSULIN 6.0 11/25/2020  ? INSULIN 4.3 10/09/2019  ? INSULIN 8.6 07/05/2019  ? INSULIN 6.4 12/05/2018  ? INSULIN 3.6 04/27/2018  ? ?Lab Results  ?Component Value Date  ? TSH 1.770 04/14/2021  ? ?Lab Results  ?Component Value Date  ? CHOL 149 11/25/2020  ? HDL 62 11/25/2020  ? [place] 73 11/25/2020  ? TRIG 73 11/25/2020  ? CHOLHDL 2.1 07/17/2020  ? ?Lab Results  ?Component Value Date  ? VD25OH 55.3 04/14/2021  ? VD25OH 66.2 11/25/2020  ? VD25OH 58.1 04/30/2020  ? ?Lab Results  ?Component Value Date  ? WBC 3.6 04/14/2021  ? HGB 12.0 04/14/2021  ? HCT 38.3 04/14/2021  ? MCV 94 04/14/2021  ? PLT 238 04/14/2021  ? ?Lab Results  ?Component Value Date  ? IRON 42 04/14/2021  ? TIBC 231 (L) 04/14/2021  ? FERRITIN 338 (H) 04/14/2021  ? ? ?Obesity Behavioral Intervention:  ? ?Approximately 15 minutes were spent on the discussion below. ? ?ASK: ?We discussed the diagnosis of obesity with Heather [name] today and Heather [name] agreed to give [name] permission to discuss obesity behavioral modification therapy today. ? ?ASSESS: ?Heather [name] has the diagnosis of obesity and her BMI today is 34.5. Heather [name] is in the action stage of change.  ? ?ADVISE: ?Heather [name] was educated on the multiple health risks of obesity as well as the benefit of weight loss to improve her health. She was advised of the need for long term treatment and the importance of lifestyle modifications to improve her current health and to decrease her risk of future health problems. ? ?AGREE: ?Multiple dietary modification options and treatment options were discussed and Heather [name] agreed to follow the recommendations documented in the above note. ? ?ARRANGE: ?Heather [name] was educated on the importance of frequent visits to treat obesity as outlined per CMS and USPSTF guidelines and agreed to schedule her next follow up appointment today. ? ?Attestation Statements:  ? ?Reviewed by clinician on day of  visit: allergies, medications, problem list, medical history, surgical history, family history, social history, and previous encounter notes. ?  ? ?I, [name], am acting as transcriptionist for [name], MD. ? ?I have reviewed the above documentation for accuracy and completeness, and I agree with the above. -  [name], MD ? ? ?

## 2021-05-21 ENCOUNTER — Encounter: Payer: Self-pay | Admitting: Orthopaedic Surgery

## 2021-05-21 ENCOUNTER — Ambulatory Visit (INDEPENDENT_AMBULATORY_CARE_PROVIDER_SITE_OTHER): Payer: Medicare Other | Admitting: Orthopaedic Surgery

## 2021-05-21 DIAGNOSIS — Z96652 Presence of left artificial knee joint: Secondary | ICD-10-CM

## 2021-05-21 NOTE — Progress Notes (Signed)
The patient is 10 weeks status post a left total knee arthroplasty.  She is making good progress.  She is ambulating with a cane.  She is not taking medications daily for knee pain.  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  She did have a heel wound that is healing over nicely.  She does ambulate using a cane.  She is 76 years old. ? ?On exam her left operative knee has full extension and flexion is to about 100 degrees.  There is still some swelling.  Is stable ligamentously. ? ?From my standpoint she will continue to increase her activities as comfort allows.  We will see her back in 3 months.  We will have a standing AP and lateral of the left operative knee. ?

## 2021-05-26 DIAGNOSIS — R531 Weakness: Secondary | ICD-10-CM | POA: Diagnosis not present

## 2021-05-26 DIAGNOSIS — Z96652 Presence of left artificial knee joint: Secondary | ICD-10-CM | POA: Diagnosis not present

## 2021-05-26 DIAGNOSIS — M25662 Stiffness of left knee, not elsewhere classified: Secondary | ICD-10-CM | POA: Diagnosis not present

## 2021-05-26 DIAGNOSIS — M25562 Pain in left knee: Secondary | ICD-10-CM | POA: Diagnosis not present

## 2021-05-26 DIAGNOSIS — R262 Difficulty in walking, not elsewhere classified: Secondary | ICD-10-CM | POA: Diagnosis not present

## 2021-06-04 DIAGNOSIS — R531 Weakness: Secondary | ICD-10-CM | POA: Diagnosis not present

## 2021-06-04 DIAGNOSIS — M25562 Pain in left knee: Secondary | ICD-10-CM | POA: Diagnosis not present

## 2021-06-04 DIAGNOSIS — R262 Difficulty in walking, not elsewhere classified: Secondary | ICD-10-CM | POA: Diagnosis not present

## 2021-06-04 DIAGNOSIS — M25662 Stiffness of left knee, not elsewhere classified: Secondary | ICD-10-CM | POA: Diagnosis not present

## 2021-06-04 DIAGNOSIS — Z96652 Presence of left artificial knee joint: Secondary | ICD-10-CM | POA: Diagnosis not present

## 2021-06-12 DIAGNOSIS — R262 Difficulty in walking, not elsewhere classified: Secondary | ICD-10-CM | POA: Diagnosis not present

## 2021-06-12 DIAGNOSIS — M25662 Stiffness of left knee, not elsewhere classified: Secondary | ICD-10-CM | POA: Diagnosis not present

## 2021-06-12 DIAGNOSIS — R531 Weakness: Secondary | ICD-10-CM | POA: Diagnosis not present

## 2021-06-12 DIAGNOSIS — Z96652 Presence of left artificial knee joint: Secondary | ICD-10-CM | POA: Diagnosis not present

## 2021-06-12 DIAGNOSIS — M25562 Pain in left knee: Secondary | ICD-10-CM | POA: Diagnosis not present

## 2021-06-16 ENCOUNTER — Ambulatory Visit (INDEPENDENT_AMBULATORY_CARE_PROVIDER_SITE_OTHER): Payer: Medicare Other | Admitting: Family Medicine

## 2021-06-16 ENCOUNTER — Encounter (INDEPENDENT_AMBULATORY_CARE_PROVIDER_SITE_OTHER): Payer: Self-pay | Admitting: Family Medicine

## 2021-06-16 VITALS — BP 121/78 | HR 104 | Temp 97.8°F | Ht 65.0 in | Wt 208.0 lb

## 2021-06-16 DIAGNOSIS — E669 Obesity, unspecified: Secondary | ICD-10-CM | POA: Diagnosis not present

## 2021-06-16 DIAGNOSIS — Z6834 Body mass index (BMI) 34.0-34.9, adult: Secondary | ICD-10-CM | POA: Diagnosis not present

## 2021-06-16 DIAGNOSIS — F439 Reaction to severe stress, unspecified: Secondary | ICD-10-CM

## 2021-06-25 DIAGNOSIS — M25662 Stiffness of left knee, not elsewhere classified: Secondary | ICD-10-CM | POA: Diagnosis not present

## 2021-06-25 DIAGNOSIS — R531 Weakness: Secondary | ICD-10-CM | POA: Diagnosis not present

## 2021-06-25 DIAGNOSIS — Z96652 Presence of left artificial knee joint: Secondary | ICD-10-CM | POA: Diagnosis not present

## 2021-06-25 DIAGNOSIS — M25562 Pain in left knee: Secondary | ICD-10-CM | POA: Diagnosis not present

## 2021-06-25 DIAGNOSIS — R262 Difficulty in walking, not elsewhere classified: Secondary | ICD-10-CM | POA: Diagnosis not present

## 2021-06-25 NOTE — Progress Notes (Signed)
? ? ? ?Chief Complaint:  ? ?OBESITY ?Heather [name] is here to discuss her progress with her obesity treatment plan along with follow-up of her obesity related diagnoses. Heather [name] is on the Category 2 Plan and states she is following her eating plan approximately 10% of the time. Heather [name] states she is doing physical therapy and on the elliptical for 60 minutes 1-2 times per week. ? ?Today's visit was #: 73 ?Starting weight: 268 lbs ?Starting date: 12/23/2016 ?Today's weight: 208 lbs ?Today's date: 06/16/2021 ?Total lbs lost to date: 65 ?Total lbs lost since last in-office visit: 0 ? ?Interim History: Heather [name] feels she hasn't been able to concentrate on weight loss recently. She has had increased stress in her life. She  is working on decreasing emotional eating behaviors.  ? ?Subjective:  ? ?1. Stress ?Heather [name] is dealing with increased stress. She has struggled with stress eating. She has had increased temptations as well. She is working on Investment banker, operational.  ? ?Assessment/Plan:  ? ?1. Stress ?We discussed stress reduction strategies to help her stay on track and decrease emotional eating behaviors. We will follow up in 1 month.  ? ?2. Obesity, with current BMI of 34.7 ?Heather [name] is currently in the action stage of change. As such, her goal is to continue with weight loss efforts. She has agreed to practicing portion control and making smarter food choices, such as increasing vegetables and decreasing simple carbohydrates.  ? ?Exercise goals: As is. ? ?Behavioral modification strategies: increasing lean protein intake and emotional eating strategies. ? ?Heather [name] has agreed to follow-up with our clinic in 4 to 5 weeks. She was informed of the importance of frequent follow-up visits to maximize her success with intensive lifestyle modifications for her multiple health conditions.  ? ?Objective:  ? ?Blood pressure 121/78, pulse (!) 104, temperature 97.8 ?F (36.6 ?C), height 5' 5"  (1.651 m), weight 208 lb (94.3 kg), SpO2 98 %. ?Body  mass index is 34.61 kg/m?. ? ?General: Cooperative, alert, well developed, in no acute distress. ?HEENT: Conjunctivae and lids unremarkable. ?Cardiovascular: Regular rhythm.  ?Lungs: Normal work of breathing. ?Neurologic: No focal deficits.  ? ?Lab Results  ?Component Value Date  ? CREATININE 0.87 04/14/2021  ? BUN 14 04/14/2021  ? NA 143 04/14/2021  ? K 3.7 04/14/2021  ? CL 105 04/14/2021  ? CO2 25 04/14/2021  ? ?Lab Results  ?Component Value Date  ? ALT 10 04/14/2021  ? AST 23 04/14/2021  ? ALKPHOS 76 04/14/2021  ? BILITOT 0.2 04/14/2021  ? ?Lab Results  ?Component Value Date  ? HGBA1C 5.3 04/14/2021  ? HGBA1C 5.8 (H) 03/07/2021  ? HGBA1C 6.1 (H) 11/25/2020  ? HGBA1C 5.9 06/21/2020  ? HGBA1C 5.8 (H) 04/30/2020  ? ?Lab Results  ?Component Value Date  ? INSULIN 6.0 11/25/2020  ? INSULIN 4.3 10/09/2019  ? INSULIN 8.6 07/05/2019  ? INSULIN 6.4 12/05/2018  ? INSULIN 3.6 04/27/2018  ? ?Lab Results  ?Component Value Date  ? TSH 1.770 04/14/2021  ? ?Lab Results  ?Component Value Date  ? CHOL 149 11/25/2020  ? HDL 62 11/25/2020  ? [place] 73 11/25/2020  ? TRIG 73 11/25/2020  ? CHOLHDL 2.1 07/17/2020  ? ?Lab Results  ?Component Value Date  ? VD25OH 55.3 04/14/2021  ? VD25OH 66.2 11/25/2020  ? VD25OH 58.1 04/30/2020  ? ?Lab Results  ?Component Value Date  ? WBC 3.6 04/14/2021  ? HGB 12.0 04/14/2021  ? HCT 38.3 04/14/2021  ? MCV 94 04/14/2021  ? PLT 238  04/14/2021  ? ?Lab Results  ?Component Value Date  ? IRON 42 04/14/2021  ? TIBC 231 (L) 04/14/2021  ? FERRITIN 338 (H) 04/14/2021  ? ?Attestation Statements:  ? ?Reviewed by clinician on day of visit: allergies, medications, problem list, medical history, surgical history, family history, social history, and previous encounter notes. ? ?Time spent on visit including pre-visit chart review and post-visit care and charting was 30 minutes.  ? ? ?I, [name], am acting as transcriptionist for [name], MD. ? ?I have reviewed the above documentation for accuracy and  completeness, and I agree with the above. -  [name], MD ? ? ?

## 2021-07-01 ENCOUNTER — Ambulatory Visit: Payer: Medicare Other | Admitting: Internal Medicine

## 2021-07-02 ENCOUNTER — Ambulatory Visit (INDEPENDENT_AMBULATORY_CARE_PROVIDER_SITE_OTHER): Payer: Medicare Other | Admitting: Internal Medicine

## 2021-07-02 ENCOUNTER — Encounter: Payer: Self-pay | Admitting: Internal Medicine

## 2021-07-02 VITALS — BP 114/70 | HR 67 | Temp 98.0°F | Resp 14 | Ht 65.0 in | Wt 206.2 lb

## 2021-07-02 DIAGNOSIS — G894 Chronic pain syndrome: Secondary | ICD-10-CM

## 2021-07-02 DIAGNOSIS — E7800 Pure hypercholesterolemia, unspecified: Secondary | ICD-10-CM

## 2021-07-02 DIAGNOSIS — R7303 Prediabetes: Secondary | ICD-10-CM | POA: Diagnosis not present

## 2021-07-02 DIAGNOSIS — K449 Diaphragmatic hernia without obstruction or gangrene: Secondary | ICD-10-CM

## 2021-07-02 DIAGNOSIS — Z1211 Encounter for screening for malignant neoplasm of colon: Secondary | ICD-10-CM | POA: Diagnosis not present

## 2021-07-02 DIAGNOSIS — R197 Diarrhea, unspecified: Secondary | ICD-10-CM | POA: Diagnosis not present

## 2021-07-02 DIAGNOSIS — R112 Nausea with vomiting, unspecified: Secondary | ICD-10-CM | POA: Diagnosis not present

## 2021-07-02 DIAGNOSIS — R600 Localized edema: Secondary | ICD-10-CM | POA: Diagnosis not present

## 2021-07-02 DIAGNOSIS — E119 Type 2 diabetes mellitus without complications: Secondary | ICD-10-CM

## 2021-07-02 DIAGNOSIS — K219 Gastro-esophageal reflux disease without esophagitis: Secondary | ICD-10-CM | POA: Diagnosis not present

## 2021-07-02 MED ORDER — PANTOPRAZOLE SODIUM 20 MG PO TBEC: 20.0000 mg | DELAYED_RELEASE_TABLET | Freq: Two times a day (BID) | ORAL | 3 refills | Status: DC

## 2021-07-02 MED ORDER — GABAPENTIN 100 MG PO CAPS: ORAL_CAPSULE | ORAL | 3 refills | Status: DC

## 2021-07-02 MED ORDER — TRAMADOL HCL 50 MG PO TABS
50.0000 mg | ORAL_TABLET | Freq: Two times a day (BID) | ORAL | 2 refills | Status: DC | PRN
Start: 2021-07-02 — End: 2022-04-30

## 2021-07-02 MED ORDER — TRAMADOL HCL 50 MG PO TABS: 50.0000 mg | ORAL_TABLET | Freq: Two times a day (BID) | ORAL | 2 refills | Status: DC | PRN

## 2021-07-02 MED ORDER — ROSUVASTATIN CALCIUM 40 MG PO TABS: 40.0000 mg | ORAL_TABLET | Freq: Every day | ORAL | 3 refills | Status: DC

## 2021-07-02 MED ORDER — FUROSEMIDE 40 MG PO TABS: 40.0000 mg | ORAL_TABLET | Freq: Every day | ORAL | 3 refills | Status: DC | PRN

## 2021-07-02 NOTE — Progress Notes (Signed)
Chief Complaint  ?Patient presents with  ? Follow-up  ?  6 mon, refill tramadol if possible. Denies any concerns or pain.   ? ?F/u  ?1. Chronic pain low back and arthritis multiple joints wants refill of tramadol 50-100 mg bid prn pt takes prn when pain is worse on contract  ?2. Due for colonoscopy [facility]  ?3. Large hiatal hernia and GERD episodes Monday tried miralax, peptobismol drinking 1 gallon of water she also had n/v/diarrhea reduced appetite  and had chicken noodle soup and crackers and she lost 7 lbs but has gained 3 lbs back  ?On protonix 20 mg qd disc can increased dose to 20 mg bid prn  ? ? ?Review of Systems  ?Constitutional:  Positive for weight loss.  ?HENT:  Negative for hearing loss.   ?Eyes:  Negative for blurred vision.  ?Respiratory:  Negative for shortness of breath.   ?Cardiovascular:  Negative for chest pain.  ?Gastrointestinal:  Positive for diarrhea, heartburn, nausea and vomiting. Negative for abdominal pain and blood in stool.  ?Genitourinary:  Negative for dysuria.  ?Musculoskeletal:  Negative for falls and joint pain.  ?Skin:  Negative for rash.  ?Neurological:  Negative for headaches.  ?Psychiatric/Behavioral:  Negative for depression.   ?Past Medical History:  ?Diagnosis Date  ? Abdominal hernia   ? Abnormal Pap smear   ? ALLERGIC RHINITIS 10/22/2006  ? Allergy   ? ANEMIA 12/18/2008  ? Arthritis   ? scoliosis moderate deg changes lumbar Xray 11/04/17   ? Asthma   ? ASYMPTOMATIC POSTMENOPAUSAL STATUS 11/22/2007  ? Back pain   ? CAD (coronary artery disease)   ? in LAD  ? CHF (congestive heart failure) ([place])   ? Complication of anesthesia 2010  ? pt states she woke up during colonoscopy and was aware of "losing control of breathing" during mammoplasty  ? Degenerative arthritis   ? Diverticulosis   ? Dyslipidemia   ? Fibroid   ? Frequent headaches   ? GERD 07/20/2007  ? GOITER, MULTINODULAR 07/20/2007  ? Headache(784.0) 07/20/2007  ? HEARING LOSS 11/22/2007  ? Hiatal hernia   ? Hiatal  hernia   ? large  ? History of chicken pox   ? Hx of colposcopy with cervical biopsy   ? HYPERCHOLESTEROLEMIA 01/13/2007  ? Hyperglycemia   ? HYPERTENSION 10/22/2006  ? Lung nodule   ? unchanged since 05/26/13   ? Migraines   ? NASH (nonalcoholic steatohepatitis)   ? Nocturnal hypoxemia 02/17/2013  ? Obesity   ? Obstructive sleep apnea   ? OSA on CPAP   ? per neurology  ? OSTEOARTHRITIS 10/22/2006  ? Other chronic nonalcoholic liver disease 16/12/9602  ? Pre-diabetes   ? Sedimentation rate elevation   ? Sleep apnea   ? on cpap  ? Urine incontinence   ? UTI (urinary tract infection)   ? ?Past Surgical History:  ?Procedure Laterality Date  ? BREAST SURGERY  1984  ? Breast reduction b/l   ? CATARACT EXTRACTION, BILATERAL    ? DEXA  08/2005  ? DILATION AND CURETTAGE OF UTERUS    ? ELECTROCARDIOGRAM  10/15/2006  ? ESOPHAGOGASTRODUODENOSCOPY  12/08/2005  ? FOOT SURGERY    ? hammertoe and bunion 05/2017   ? Stress Cardiolite  10/21/2005  ? sweat gland removal    ? 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  ? Procedure: Left TOTAL KNEE ARTHROPLASTY;  Surgeon: Mcarthur Rossetti, MD;  Location: [facility];  Service: Orthopedics;  Laterality: Left;  ? WISDOM  TOOTH EXTRACTION    ? ?Family History  ?Problem Relation Age of Onset  ? Asthma Mother   ? Depression Mother   ? Bipolar disorder Mother   ? Dementia Mother   ? Arthritis Mother   ? Breast cancer Mother   ?     primary  ? Colon cancer Mother   ?     mets from breast  ? Cancer Mother   ?     colon  ? Hypertension Father   ? Migraines Father   ? Cancer Maternal Aunt   ?     ?  ? Heart disease Maternal Grandmother   ? Cancer Maternal Grandfather   ?     stomach ?  ? Sleep apnea Neg Hx   ? ?Social History  ? ?Socioeconomic History  ? Marital status: Married  ?  Spouse name: [name]  ? Number of children: 2  ? Years of education: College  ? Highest education level: Not on file  ?Occupational History  ? Occupation: Retired  ?Tobacco Use  ? Smoking status: Former  ?  Packs/day: 2.00  ?   Years: 20.00  ?  Pack years: 40.00  ?  Types: Cigarettes  ?  Quit date: 03/03/1979  ?  Years since quitting: 42.3  ? Smokeless tobacco: Never  ?Vaping Use  ? Vaping Use: Never used  ?Substance and Sexual Activity  ? Alcohol use: Yes  ?  Comment: rare - TWICE A YR  ? Drug use: No  ? Sexual activity: Yes  ?  Birth control/protection: Surgical  ?Other Topics Concern  ? Not on file  ?Social History Narrative  ? Patient is married [name]).  ? Right Handed  ? Drinks 1 cup caffeine  ? ?Social Determinants of Health  ? ?Financial Resource Strain: Low Risk   ? Difficulty of Paying Living Expenses: Not hard at all  ?Food Insecurity: No Food Insecurity  ? Worried About Charity fundraiser in the Last Year: Never true  ? Ran Out of Food in the Last Year: Never true  ?Transportation Needs: No Transportation Needs  ? Lack of Transportation (Medical): No  ? Lack of Transportation (Non-Medical): No  ?Physical Activity: Not on file  ?Stress: No Stress Concern Present  ? Feeling of Stress : Not at all  ?Social Connections: Unknown  ? Frequency of Communication with Friends and Family: Not on file  ? Frequency of Social Gatherings with Friends and Family: Not on file  ? Attends Religious Services: Not on file  ? Active Member of Clubs or Organizations: Not on file  ? Attends Archivist Meetings: Not on file  ? Marital Status: Married  ?Intimate Partner Violence: Not At Risk  ? Fear of Current or Ex-Partner: No  ? Emotionally Abused: No  ? Physically Abused: No  ? Sexually Abused: No  ? ?Current Meds  ?Medication Sig  ? acetaminophen (TYLENOL) 500 MG tablet Take 1,000 mg by mouth 3 (three) times daily as needed for moderate pain.  ? [place]-Glucosamine-Vit D (OSTEO BI-FLEX ONE PER DAY PO) Take 1 tablet by mouth daily.  ? Calcium Carb-Cholecalciferol (CHEWABLE CALCIUM/D3 PO) Take 1 tablet by mouth daily.  ? cetirizine (ZYRTEC) 10 MG tablet Take 10 mg by mouth daily.  ? clopidogrel (PLAVIX) 75 MG tablet TAKE 1 TABLET DAILY   ? Cyanocobalamin 1000 MCG CAPS Take 1 capsule by mouth daily.  ? diclofenac Sodium (VOLTAREN) 1 % GEL Apply 1 application topically 4 (four) times daily as  needed (pain).  ? ezetimibe (ZETIA) 10 MG tablet TAKE 1 TABLET DAILY  ? Ferrous Sulfate (IRON) 325 (65 FE) MG TABS Take 1 tablet by mouth daily.  ? HYDROcodone-acetaminophen (NORCO/VICODIN) 5-325 MG tablet Take 1 tablet by mouth every 6 (six) hours as needed for moderate pain.  ? Multiple Vitamin (MULTIVITAMIN) tablet Take 1 tablet by mouth daily.  ? naproxen sodium (ALEVE) 220 MG tablet Take 220 mg by mouth daily as needed (pain).  ? Omega-3 Fatty Acids (FISH OIL PO) Take 2 g by mouth daily.  ? OVER THE COUNTER MEDICATION Take 1 tablet by mouth daily. Instaflex otc supplement  ? polyethylene glycol (MIRALAX / GLYCOLAX) 17 g packet Take 17 g by mouth daily as needed for moderate constipation.  ? potassium chloride SA (KLOR-CON M20) 20 MEQ tablet Take 1 tablet (20 mEq total) by mouth daily.  ? pyridoxine (B-6) 200 MG tablet Take 200 mg by mouth daily.  ? topiramate (TOPAMAX) 25 MG tablet Take 3 tablets (75 mg total) by mouth at bedtime.  ? trolamine salicylate (ASPERCREME) 10 % cream Apply 1 application topically as needed for muscle pain.  ? trospium (SANCTURA) 20 MG tablet Take 1 tablet (20 mg total) by mouth 2 (two) times daily. Prn overactive bladder (Patient taking differently: Take 20 mg by mouth 2 (two) times daily.)  ? TURMERIC PO Take 538 mg by mouth daily.  ? Vitamin D, Ergocalciferol, (DRISDOL) 1.25 MG (50000 UNIT) CAPS capsule Take 1 capsule (50,000 Units total) by mouth every 7 (seven) days.  ? vitamin E 200 UNIT capsule Take 200 Units by mouth daily.  ? [DISCONTINUED] furosemide (LASIX) 40 MG tablet TAKE 1 TABLET DAILY AS     NEEDED**SOLCO (Patient taking differently: Take 40 mg by mouth daily.)  ? [DISCONTINUED] gabapentin (NEURONTIN) 100 MG capsule TAKE 3 CAPSULES BY MOUTH AT BEDTIME  ? [DISCONTINUED] oxyCODONE (OXY IR/ROXICODONE) 5 MG immediate  release tablet Take 1-2 tablets (5-10 mg total) by mouth every 6 (six) hours as needed for moderate pain (pain score 4-6).  ? [DISCONTINUED] pantoprazole (PROTONIX) 20 MG tablet TAKE 1 TABLET DAILY 38     M

## 2021-07-02 NOTE — Patient Instructions (Addendum)
Mammogram due 07/22/21  ?Have them fax results to me [phone]  ? ?If abdominal symptoms, GERD happen again let me know and will order imaging ultrasound or CT abdomen/pelvis  ? ?Dr. Burna Mortimer ? ?Phone Fax E-mail Address  ?[phone] Not available [address] [place]  ? [place] [phone]  ?   ?Specialties     ?Gastroenterology     ? ? ?Consider new covid shot in the fall 2023  ?Call and schedule yearly eye doctor appointment  ? ?Protonix 20 mg daily if needed can take additional 20 mg 30 minutes before food for GERD flare  ? ? ?Gastroesophageal Reflux Disease, Adult ?Gastroesophageal reflux (GER) happens when acid from the stomach flows up into the tube that connects the mouth and the stomach (esophagus). Normally, food travels down the esophagus and stays in the stomach to be digested. However, when a person has GER, food and stomach acid sometimes move back up into the esophagus. If this becomes a more serious problem, the person may be diagnosed with a disease called gastroesophageal reflux disease (GERD). GERD occurs when the reflux: ?Happens often. ?Causes frequent or severe symptoms. ?Causes problems such as damage to the esophagus. ?When stomach acid comes in contact with the esophagus, the acid may cause inflammation in the esophagus. Over time, GERD may create small holes (ulcers) in the lining of the esophagus. ?What are the causes? ?This condition is caused by a problem with the muscle between the esophagus and the stomach (lower esophageal sphincter, or LES). Normally, the LES muscle closes after food passes through the esophagus to the stomach. When the LES is weakened or abnormal, it does not close properly, and that allows food and stomach acid to go back up into the esophagus. ?The LES can be weakened by certain dietary substances, medicines, and medical conditions, including: ?Tobacco use. ?Pregnancy. ?Having a hiatal hernia. ?Alcohol use. ?Certain foods and beverages, such as  coffee, chocolate, onions, and peppermint. ?What increases the risk? ?You are more likely to develop this condition if you: ?Have an increased body weight. ?Have a connective tissue disorder. ?Take NSAIDs, such as ibuprofen. ?What are the signs or symptoms? ?Symptoms of this condition include: ?Heartburn. ?Difficult or painful swallowing and the feeling of having a lump in the throat. ?A bitter taste in the mouth. ?Bad breath and having a large amount of saliva. ?Having an upset or bloated stomach and belching. ?Chest pain. Different conditions can cause chest pain. Make sure you see your health care provider if you experience chest pain. ?Shortness of breath or wheezing. ?Ongoing (chronic) cough or a nighttime cough. ?Wearing away of tooth enamel. ?Weight loss. ?How is this diagnosed? ?This condition may be diagnosed based on a medical history and a physical exam. To determine if you have mild or severe GERD, your health care provider may also monitor how you respond to treatment. You may also have tests, including: ?A test to examine your stomach and esophagus with a small camera (endoscopy). ?A test that measures the acidity level in your esophagus. ?A test that measures how much pressure is on your esophagus. ?A barium swallow or modified barium swallow test to show the shape, size, and functioning of your esophagus. ?How is this treated? ?Treatment for this condition may vary depending on how severe your symptoms are. Your health care provider may recommend: ?Changes to your diet. ?Medicine. ?Surgery. ?The goal of treatment is to help relieve your symptoms and to prevent complications. ?Follow these instructions at home: ?  Eating and drinking ? ?Follow a diet as recommended by your health care provider. This may involve avoiding foods and drinks such as: ?Coffee and tea, with or without caffeine. ?Drinks that contain alcohol. ?Energy drinks and sports drinks. ?Carbonated drinks or sodas. ?Chocolate and  cocoa. ?Peppermint and mint flavorings. ?Garlic and onions. ?Horseradish. ?Spicy and acidic foods, including peppers, chili powder, curry powder, vinegar, hot sauces, and barbecue sauce. ?Citrus fruit juices and citrus fruits, such as oranges, lemons, and limes. ?Tomato-based foods, such as red sauce, chili, salsa, and pizza with red sauce. ?Fried and fatty foods, such as donuts, french fries, potato chips, and high-fat dressings. ?High-fat meats, such as hot dogs and fatty cuts of red and white meats, such as rib eye steak, sausage, ham, and bacon. ?High-fat dairy items, such as whole milk, butter, and cream cheese. ?Eat small, frequent meals instead of large meals. ?Avoid drinking large amounts of liquid with your meals. ?Avoid eating meals during the 2-3 hours before bedtime. ?Avoid lying down right after you eat. ?Do not exercise right after you eat. ?Lifestyle ? ?Do not use any products that contain nicotine or tobacco. These products include cigarettes, chewing tobacco, and vaping devices, such as e-cigarettes. If you need help quitting, ask your health care provider. ?Try to reduce your stress by using methods such as yoga or meditation. If you need help reducing stress, ask your health care provider. ?If you are overweight, reduce your weight to an amount that is healthy for you. Ask your health care provider for guidance about a safe weight loss goal. ?General instructions ?Pay attention to any changes in your symptoms. ?Take over-the-counter and prescription medicines only as told by your health care provider. Do not take aspirin, ibuprofen, or other NSAIDs unless your health care provider told you to take these medicines. ?Wear loose-fitting clothing. Do not wear anything tight around your waist that causes pressure on your abdomen. ?Raise (elevate) the head of your bed about 6 inches (15 cm). You can use a wedge to do this. ?Avoid bending over if this makes your symptoms worse. ?Keep all follow-up  visits. This is important. ?Contact a health care provider if: ?You have: ?New symptoms. ?Unexplained weight loss. ?Difficulty swallowing or it hurts to swallow. ?Wheezing or a persistent cough. ?A hoarse voice. ?Your symptoms do not improve with treatment. ?Get help right away if: ?You have sudden pain in your arms, neck, jaw, teeth, or back. ?You suddenly feel sweaty, dizzy, or light-headed. ?You have chest pain or shortness of breath. ?You vomit and the vomit is green, yellow, or black, or it looks like blood or coffee grounds. ?You faint. ?You have stool that is red, bloody, or black. ?You cannot swallow, drink, or eat. ?These symptoms may represent a serious problem that is an emergency. Do not wait to see if the symptoms will go away. Get medical help right away. Call your local emergency services (911 in the [place] Do not drive yourself to the hospital. ?Summary ?Gastroesophageal reflux happens when acid from the stomach flows up into the esophagus. GERD is a disease in which the reflux happens often, causes frequent or severe symptoms, or causes problems such as damage to the esophagus. ?Treatment for this condition may vary depending on how severe your symptoms are. Your health care provider may recommend diet and lifestyle changes, medicine, or surgery. ?Contact a health care provider if you have new or worsening symptoms. ?Take over-the-counter and prescription medicines only as told by your health care provider.  Do not take aspirin, ibuprofen, or other NSAIDs unless your health care provider told you to do so. ?Keep all follow-up visits as told by your health care provider. This is important. ?This information is not intended to replace advice given to you by your health care provider. Make sure you discuss any questions you have with your health care provider. ?Document Revised: 08/28/2019 Document Reviewed: 08/28/2019 ?Elsevier Patient Education ? [place]. ? ?Food Choices for Gastroesophageal  Reflux Disease, Adult ?When you have gastroesophageal reflux disease (GERD), the foods you eat and your eating habits are very important. Choosing the right foods can help ease the discomfort of GERD. Consider working

## 2021-07-11 ENCOUNTER — Other Ambulatory Visit: Payer: Self-pay | Admitting: Internal Medicine

## 2021-07-11 DIAGNOSIS — R609 Edema, unspecified: Secondary | ICD-10-CM

## 2021-07-15 ENCOUNTER — Ambulatory Visit: Payer: Medicare Other | Admitting: Podiatry

## 2021-07-17 ENCOUNTER — Encounter (INDEPENDENT_AMBULATORY_CARE_PROVIDER_SITE_OTHER): Payer: Self-pay | Admitting: Family Medicine

## 2021-07-17 ENCOUNTER — Ambulatory Visit (INDEPENDENT_AMBULATORY_CARE_PROVIDER_SITE_OTHER): Payer: Medicare Other | Admitting: Family Medicine

## 2021-07-17 VITALS — BP 117/69 | HR 62 | Temp 97.9°F | Ht 65.0 in | Wt 205.0 lb

## 2021-07-17 DIAGNOSIS — E559 Vitamin D deficiency, unspecified: Secondary | ICD-10-CM | POA: Diagnosis not present

## 2021-07-17 DIAGNOSIS — E669 Obesity, unspecified: Secondary | ICD-10-CM

## 2021-07-17 DIAGNOSIS — Z6834 Body mass index (BMI) 34.0-34.9, adult: Secondary | ICD-10-CM | POA: Diagnosis not present

## 2021-07-17 MED ORDER — VITAMIN D (ERGOCALCIFEROL) 1.25 MG (50000 UNIT) PO CAPS: 50000.0000 [IU] | ORAL_CAPSULE | ORAL | 0 refills | Status: DC

## 2021-07-21 ENCOUNTER — Encounter (INDEPENDENT_AMBULATORY_CARE_PROVIDER_SITE_OTHER): Payer: Self-pay | Admitting: Family Medicine

## 2021-07-24 ENCOUNTER — Ambulatory Visit (INDEPENDENT_AMBULATORY_CARE_PROVIDER_SITE_OTHER): Payer: Medicare Other | Admitting: Podiatry

## 2021-07-24 DIAGNOSIS — M79676 Pain in unspecified toe(s): Secondary | ICD-10-CM

## 2021-07-24 DIAGNOSIS — B351 Tinea unguium: Secondary | ICD-10-CM | POA: Diagnosis not present

## 2021-07-27 NOTE — Progress Notes (Signed)
She presents today chief complaint of painful elongated toenails 1 through 5 bilaterally.  States that her knee surgery has done well and is getting better.  Objective: Toenails are thick yellow dystrophic clinical mycotic pulses are strongly palpable.  No open lesions or wounds are identified.  Assessment: Pain in limb secondary to onychomycosis.  Plan: Debridement of toenails 1 through 5 bilateral covered service secondary to pain.

## 2021-07-31 NOTE — Progress Notes (Signed)
Chief Complaint:   OBESITY Heather [name] is here to discuss her progress with her obesity treatment plan along with follow-up of her obesity related diagnoses. Heather [name] is on practicing portion control and making smarter food choices, such as increasing vegetables and decreasing simple carbohydrates and states she is following her eating plan approximately 40% of the time. Heather [name] states she is on the elliptical for 30 minutes 1 time per week.  Today's visit was #: 8 Starting weight: 268 lbs Starting date: 12/23/2016 Today's weight: 205 lbs Today's date: 07/17/2021 Total lbs lost to date: 78 Total lbs lost since last in-office visit: 3  Interim History: Heather [name] continues to do well with weight loss. She is working on exercising more often. She has finished physical therapy.  Subjective:   1. Vitamin D deficiency Heather [name] is on Vitamin D, and her last level is at goal.   Assessment/Plan:   1. Vitamin D deficiency We will refill prescription Vitamin D for 1 month. St[facility] will follow-up for routine testing of Vitamin D, at least 2-3 times per year to avoid over-replacement.  - Vitamin D, Ergocalciferol, (DRISDOL) 1.25 MG (50000 UNIT) CAPS capsule; Take 1 capsule (50,000 Units total) by mouth every 7 (seven) days.  Dispense: 4 capsule; Refill: 0  2. Obesity, Current BMI 34.2 Heather [name] is currently in the action stage of change. As such, her goal is to continue with weight loss efforts. She has agreed to practicing portion control and making smarter food choices, such as increasing vegetables and decreasing simple carbohydrates.   Exercise goals: As is.  Behavioral modification strategies: increasing lean protein intake.  Heather [name] has agreed to follow-up with our clinic in 4 weeks. She was informed of the importance of frequent follow-up visits to maximize her success with intensive lifestyle modifications for her multiple health conditions.   Objective:   Blood pressure 117/69, pulse 62, temperature  97.9 F (36.6 C), height 5' 5"  (1.651 m), weight 205 lb (93 kg), SpO2 99 %. Body mass index is 34.11 kg/m.  General: Cooperative, alert, well developed, in no acute distress. HEENT: Conjunctivae and lids unremarkable. Cardiovascular: Regular rhythm.  Lungs: Normal work of breathing. Neurologic: No focal deficits.   Lab Results  Component Value Date   CREATININE 0.87 04/14/2021   BUN 14 04/14/2021   NA 143 04/14/2021   K 3.7 04/14/2021   CL 105 04/14/2021   CO2 25 04/14/2021   Lab Results  Component Value Date   ALT 10 04/14/2021   AST 23 04/14/2021   ALKPHOS 76 04/14/2021   BILITOT 0.2 04/14/2021   Lab Results  Component Value Date   HGBA1C 5.3 04/14/2021   HGBA1C 5.8 (H) 03/07/2021   HGBA1C 6.1 (H) 11/25/2020   HGBA1C 5.9 06/21/2020   HGBA1C 5.8 (H) 04/30/2020   Lab Results  Component Value Date   INSULIN 6.0 11/25/2020   INSULIN 4.3 10/09/2019   INSULIN 8.6 07/05/2019   INSULIN 6.4 12/05/2018   INSULIN 3.6 04/27/2018   Lab Results  Component Value Date   TSH 1.770 04/14/2021   Lab Results  Component Value Date   CHOL 149 11/25/2020   HDL 62 11/25/2020   LDLCALC 73 11/25/2020   TRIG 73 11/25/2020   CHOLHDL 2.1 07/17/2020   Lab Results  Component Value Date   VD25OH 55.3 04/14/2021   VD25OH 66.2 11/25/2020   VD25OH 58.1 04/30/2020   Lab Results  Component Value Date   WBC 3.6 04/14/2021   HGB 12.0 04/14/2021   HCT  38.3 04/14/2021   MCV 94 04/14/2021   PLT 238 04/14/2021   Lab Results  Component Value Date   IRON 42 04/14/2021   TIBC 231 (L) 04/14/2021   FERRITIN 338 (H) 04/14/2021   Attestation Statements:   Reviewed by clinician on day of visit: allergies, medications, problem list, medical history, surgical history, family history, social history, and previous encounter notes.  Time spent on visit including pre-visit chart review and post-visit care and charting was 32 minutes.    I, [name], am acting as transcriptionist for  [name], MD.  I have reviewed the above documentation for accuracy and completeness, and I agree with the above. -  [name], MD

## 2021-08-12 ENCOUNTER — Telehealth: Payer: Self-pay | Admitting: *Deleted

## 2021-08-12 NOTE — Telephone Encounter (Signed)
Ortho bundle 90 day call and outcomes completed.

## 2021-08-13 ENCOUNTER — Encounter (INDEPENDENT_AMBULATORY_CARE_PROVIDER_SITE_OTHER): Payer: Self-pay | Admitting: Family Medicine

## 2021-08-13 ENCOUNTER — Ambulatory Visit (INDEPENDENT_AMBULATORY_CARE_PROVIDER_SITE_OTHER): Payer: Medicare Other | Admitting: Family Medicine

## 2021-08-13 VITALS — BP 116/68 | HR 60 | Temp 98.2°F | Ht 65.0 in | Wt 206.0 lb

## 2021-08-13 DIAGNOSIS — Z6834 Body mass index (BMI) 34.0-34.9, adult: Secondary | ICD-10-CM

## 2021-08-13 DIAGNOSIS — E559 Vitamin D deficiency, unspecified: Secondary | ICD-10-CM

## 2021-08-13 DIAGNOSIS — Z6841 Body Mass Index (BMI) 40.0 and over, adult: Secondary | ICD-10-CM

## 2021-08-13 DIAGNOSIS — E669 Obesity, unspecified: Secondary | ICD-10-CM

## 2021-08-13 MED ORDER — VITAMIN D (ERGOCALCIFEROL) 1.25 MG (50000 UNIT) PO CAPS: 50000.0000 [IU] | ORAL_CAPSULE | ORAL | 1 refills | Status: DC

## 2021-08-13 NOTE — Progress Notes (Signed)
Chief Complaint:   OBESITY Heather [name] is here to discuss her progress with her obesity treatment plan along with follow-up of her obesity related diagnoses. Naya is on practicing portion control and making smarter food choices, such as increasing vegetables and decreasing simple carbohydrates and states she is following her eating plan approximately 75-80% of the time. Takisha states she is walking and on the elliptical for 30 minutes 3 times per week.  Today's visit was #: 4 Starting weight: 268 lbs Starting date: 12/23/2016 Today's weight: 206 lbs Today's date: 08/13/2021 Total lbs lost to date: 52 Total lbs lost since last in-office visit: 0  Interim History: Evagelia is feeling well overall.  Has lost a nice amount of weight.  She is doing better with getting her protein, but sometimes her calories are low.  She notes some ongoing sabotage from her husband and other family members.  Subjective:   1. Vitamin D deficiency Caris is stable on vitamin D.  She is out of her prescription and she requests a refill today.  She denies nausea, vomiting, or muscle weakness.  Assessment/Plan:   1. Vitamin D deficiency We will refill prescription Vitamin D for 2 months. Xochitl will follow-up for routine testing of Vitamin D, at least 2-3 times per year to avoid over-replacement.  - Vitamin D, Ergocalciferol, (DRISDOL) 1.25 MG (50000 UNIT) CAPS capsule; Take 1 capsule (50,000 Units total) by mouth every 7 (seven) days.  Dispense: 4 capsule; Refill: 1  2. Obesity, Current BMI 34.4 Shauntee is currently in the action stage of change. As such, her goal is to continue with weight loss efforts. She has agreed to the Category 2 Plan.   Exercise goals: As is.   Behavioral modification strategies: dealing with family or coworker sabotage. A large amount of time was spent discussing family sabotage and strategies to deal with this.  Detta has agreed to follow-up with our clinic in 6 weeks. She was informed  of the importance of frequent follow-up visits to maximize her success with intensive lifestyle modifications for her multiple health conditions.   Objective:   Blood pressure 116/68, pulse 60, temperature 98.2 F (36.8 C), height 5' 5"  (1.651 m), weight 206 lb (93.4 kg), SpO2 98 %. Body mass index is 34.28 kg/m.  General: Cooperative, alert, well developed, in no acute distress. HEENT: Conjunctivae and lids unremarkable. Cardiovascular: Regular rhythm.  Lungs: Normal work of breathing. Neurologic: No focal deficits.   Lab Results  Component Value Date   CREATININE 0.87 04/14/2021   BUN 14 04/14/2021   NA 143 04/14/2021   K 3.7 04/14/2021   CL 105 04/14/2021   CO2 25 04/14/2021   Lab Results  Component Value Date   ALT 10 04/14/2021   AST 23 04/14/2021   ALKPHOS 76 04/14/2021   BILITOT 0.2 04/14/2021   Lab Results  Component Value Date   HGBA1C 5.3 04/14/2021   HGBA1C 5.8 (H) 03/07/2021   HGBA1C 6.1 (H) 11/25/2020   HGBA1C 5.9 06/21/2020   HGBA1C 5.8 (H) 04/30/2020   Lab Results  Component Value Date   INSULIN 6.0 11/25/2020   INSULIN 4.3 10/09/2019   INSULIN 8.6 07/05/2019   INSULIN 6.4 12/05/2018   INSULIN 3.6 04/27/2018   Lab Results  Component Value Date   TSH 1.770 04/14/2021   Lab Results  Component Value Date   CHOL 149 11/25/2020   HDL 62 11/25/2020   LDLCALC 73 11/25/2020   TRIG 73 11/25/2020   CHOLHDL 2.1 07/17/2020  Lab Results  Component Value Date   VD25OH 55.3 04/14/2021   VD25OH 66.2 11/25/2020   VD25OH 58.1 04/30/2020   Lab Results  Component Value Date   WBC 3.6 04/14/2021   HGB 12.0 04/14/2021   HCT 38.3 04/14/2021   MCV 94 04/14/2021   PLT 238 04/14/2021   Lab Results  Component Value Date   IRON 42 04/14/2021   TIBC 231 (L) 04/14/2021   FERRITIN 338 (H) 04/14/2021   Attestation Statements:   Reviewed by clinician on day of visit: allergies, medications, problem list, medical history, surgical history, family  history, social history, and previous encounter notes.  Time spent on visit including pre-visit chart review and post-visit care and charting was 45 minutes.   I, [name], am acting as transcriptionist for [name], MD.  I have reviewed the above documentation for accuracy and completeness, and I agree with the above. -  [name], MD

## 2021-08-14 DIAGNOSIS — Z1231 Encounter for screening mammogram for malignant neoplasm of breast: Secondary | ICD-10-CM | POA: Diagnosis not present

## 2021-08-14 DIAGNOSIS — Z01419 Encounter for gynecological examination (general) (routine) without abnormal findings: Secondary | ICD-10-CM | POA: Diagnosis not present

## 2021-08-18 DIAGNOSIS — E119 Type 2 diabetes mellitus without complications: Secondary | ICD-10-CM | POA: Diagnosis not present

## 2021-08-20 ENCOUNTER — Encounter: Payer: Self-pay | Admitting: Gastroenterology

## 2021-08-21 ENCOUNTER — Encounter: Payer: Self-pay | Admitting: Orthopaedic Surgery

## 2021-08-21 ENCOUNTER — Ambulatory Visit (INDEPENDENT_AMBULATORY_CARE_PROVIDER_SITE_OTHER): Payer: Medicare Other | Admitting: Orthopaedic Surgery

## 2021-08-21 ENCOUNTER — Encounter (INDEPENDENT_AMBULATORY_CARE_PROVIDER_SITE_OTHER): Payer: Self-pay | Admitting: Family Medicine

## 2021-08-21 ENCOUNTER — Ambulatory Visit (INDEPENDENT_AMBULATORY_CARE_PROVIDER_SITE_OTHER): Payer: Medicare Other

## 2021-08-21 DIAGNOSIS — Z96652 Presence of left artificial knee joint: Secondary | ICD-10-CM

## 2021-08-21 DIAGNOSIS — E559 Vitamin D deficiency, unspecified: Secondary | ICD-10-CM

## 2021-08-21 NOTE — Progress Notes (Signed)
The patient is a 76 year old female who is just over 17-monthstatus post a left total knee arthroplasty.  She reports that she is doing well.  She is walking without assistive device.  She reports increased range of motion and strength in the left knee.  She does have known osteoarthritis of the right knee but it is not bothering her the way it was before she had a knee replacement left side.  She is not interested in knee replacement surgery for the right side as of yet since she is doing well.  Examination of her left operative knee shows full extension to past 90 degrees flexion.  It feels ligamentously stable.  There is only some mild swelling.  2 views left knee show well-seated total knee arthroplasty with no complicating features.  She will continue to increase her activities as comfort allows with no restrictions.  We will see her back in 6 months which will be closer to 1 year standpoint and surgery.  We will have a final AP and lateral of her left knee at that visit.

## 2021-08-30 ENCOUNTER — Other Ambulatory Visit: Payer: Self-pay | Admitting: Neurology

## 2021-09-01 NOTE — Telephone Encounter (Signed)
Rx refilled.  As per last office visit note "Continue Topamax 75 mg at bedtime"

## 2021-09-03 NOTE — Telephone Encounter (Signed)
Ok to rf x 1

## 2021-09-08 ENCOUNTER — Telehealth: Payer: Self-pay | Admitting: *Deleted

## 2021-09-08 ENCOUNTER — Ambulatory Visit (INDEPENDENT_AMBULATORY_CARE_PROVIDER_SITE_OTHER): Payer: Medicare Other | Admitting: Gastroenterology

## 2021-09-08 ENCOUNTER — Encounter: Payer: Self-pay | Admitting: Gastroenterology

## 2021-09-08 VITALS — BP 120/66 | HR 74 | Ht 64.5 in | Wt 216.0 lb

## 2021-09-08 DIAGNOSIS — K449 Diaphragmatic hernia without obstruction or gangrene: Secondary | ICD-10-CM

## 2021-09-08 DIAGNOSIS — K5909 Other constipation: Secondary | ICD-10-CM

## 2021-09-08 DIAGNOSIS — Z86010 Personal history of colonic polyps: Secondary | ICD-10-CM | POA: Diagnosis not present

## 2021-09-08 DIAGNOSIS — R112 Nausea with vomiting, unspecified: Secondary | ICD-10-CM | POA: Diagnosis not present

## 2021-09-08 DIAGNOSIS — K219 Gastro-esophageal reflux disease without esophagitis: Secondary | ICD-10-CM | POA: Diagnosis not present

## 2021-09-08 DIAGNOSIS — K439 Ventral hernia without obstruction or gangrene: Secondary | ICD-10-CM | POA: Diagnosis not present

## 2021-09-08 DIAGNOSIS — Z800 Family history of malignant neoplasm of digestive organs: Secondary | ICD-10-CM

## 2021-09-08 DIAGNOSIS — R1084 Generalized abdominal pain: Secondary | ICD-10-CM | POA: Diagnosis not present

## 2021-09-08 NOTE — Telephone Encounter (Signed)
Primary Cardiologist:Dalton Aundra Dubin, MD  Chart reviewed as part of pre-operative protocol coverage. Because of Heather [name]'s past medical history and time since last visit, he/she will require a follow-up visit in order to better assess preoperative cardiovascular risk.  Pre-op covering staff: - Please schedule appointment with [facility] and call patient to inform them. - Please contact requesting surgeon's office via preferred method (i.e, phone, fax) to inform them of need for appointment prior to surgery.  If applicable, this message will also be routed to pharmacy pool and/or primary cardiologist for input on holding anticoagulant/antiplatelet agent as requested below so that this information is available at time of patient's appointment.   Heather Life, NP-C    09/08/2021, 3:43 PM [place] [address] Office [phone] Fax [phone]

## 2021-09-08 NOTE — Patient Instructions (Signed)
You have been scheduled for a colonoscopy. Please follow written instructions given to you at your visit today.  Please pick up your prep supplies at the pharmacy within the next 1-3 days. If you use inhalers (even only as needed), please bring them with you on the day of your procedure.   You will be contacted by our office prior to your procedure for directions on holding your Plavix.  If you do not hear from our office 1 week prior to your scheduled procedure, please call [phone] to discuss.   Due to recent changes in healthcare laws, you may see the results of your imaging and laboratory studies on MyChart before your provider has had a chance to review them.  We understand that in some cases there may be results that are confusing or concerning to you. Not all laboratory results come back in the same time frame and the provider may be waiting for multiple results in order to interpret others.  Please give [name] 48 hours in order for your provider to thoroughly review all the results before contacting the office for clarification of your results.    If you are age 2 or older, your body mass index should be between 23-30. Your Body mass index is 36.5 kg/m. If this is out of the aforementioned range listed, please consider follow up with your Primary Care Provider.  If you are age 70 or younger, your body mass index should be between 19-25. Your Body mass index is 36.5 kg/m. If this is out of the aformentioned range listed, please consider follow up with your Primary Care Provider.   ________________________________________________________  The [facility] providers would like to encourage you to use [facility] to communicate with providers for non-urgent requests or questions.  Due to long hold times on the telephone, sending your provider a message by [facility] may be a faster and more efficient way to get a response.  Please allow 48 business hours for a response.  Please remember that this is for  non-urgent requests.  _______________________________________________________   I appreciate the  opportunity to care for you  Thank You   Harl Bowie , MD

## 2021-09-08 NOTE — Telephone Encounter (Signed)
[facility] Pre-operative Risk Assessment     Request for surgical clearance:     Endoscopy Procedure  What type of surgery is being performed?     Colonoscopy by Dr Silverio Decamp   When is this surgery scheduled?     09/19/2021  What type of clearance is required ?   Pharmacy  Are there any medications that need to be held prior to surgery and how long? 5 days   Practice name and name of physician performing surgery?      [facility]  What is your office phone and fax number?      Phone- [phone]  [phone]  Anesthesia type (None, local, MAC, general) ?       MAC

## 2021-09-08 NOTE — Progress Notes (Signed)
Heather [name]    [id number]    [birth year]  Primary Care Physician:McLean-Scocuzza, [name], MD  Referring Physician: McLean-Scocuzza, [name], MD [place],  [place] [id number]   Chief complaint: History of colon polyps  HPI:  76 year old female with large hiatal hernia and ventral hernia here for follow-up visit.  She is doing better and feels her symptoms are stable.  No longer having persistent nausea, vomiting or abdominal pain.  She experiences postprandial fullness occasionally especially tries to eat heavy meals or larger portion sizes.  She is altering her dietary habits   Denies any dysphagia or odynophagia   On Miralax 1-2 times a week, has  2-3 Bm a day   On weight loss regimen, is feeling better.  She feels her symptoms occur when she overeats.  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  Subsequently she saw [name] in [facility], she was very pleased with him and thought about doing the surgery but did not go ahead with it  given the logistics of commute to [facility].  She is worried about potential risks and complications associated with the surgery and currently she is able to manage her symptoms. She is due for surveillance colonoscopy, is on Plavix.  She has aspirin allergy.   CT pelvis 04/28/31: Periumbilical abdominal wall hernia containing fat, small bowel loop and appendix, no obstruction or incarceration. Diverticulosis, no diverticulitis. Uterine fibroids.   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  Pancolonic diverticulosis, 5 mm polyp removed from sigmoid colon, hypertrophied anal papilla otherwise unremarkable exam   Denies any melena, rectal bleeding, constipation or diarrhea.   Outpatient Encounter Medications as of 09/08/2021  Medication Sig   acetaminophen (TYLENOL) 500 MG tablet Take 1,000 mg by mouth 3 (three) times daily as needed for moderate pain.   Boswellia-Glucosamine-Vit D (OSTEO BI-FLEX ONE PER DAY PO) Take 1 tablet by mouth daily.   Calcium Carb-Cholecalciferol (CHEWABLE CALCIUM/D3 PO) Take 1 tablet by mouth daily.   cetirizine (ZYRTEC) 10 MG tablet Take 10 mg by mouth daily.   clopidogrel (PLAVIX) 75 MG tablet TAKE 1 TABLET DAILY   Cyanocobalamin 1000 MCG CAPS Take 1 capsule by mouth daily.   diclofenac Sodium (VOLTAREN) 1 % GEL Apply 1 application topically 4 (four) times daily as needed (pain).   ezetimibe (ZETIA) 10 MG tablet TAKE 1 TABLET DAILY   Ferrous Sulfate (IRON) 325 (65 FE) MG TABS Take 1 tablet by mouth daily.   furosemide (LASIX) 40 MG tablet Take 1 tablet (40 mg total) by mouth daily as needed.   gabapentin (NEURONTIN) 100 MG capsule TAKE 3 CAPSULES BY MOUTH AT BEDTIME   HYDROcodone-acetaminophen (NORCO/VICODIN) 5-325 MG tablet Take 1 tablet by mouth every 6 (six) hours as needed for moderate pain.   KLOR-CON M20 20 MEQ tablet TAKE 1 TABLET DAILY   Multiple Vitamin (MULTIVITAMIN) tablet Take 1  tablet by mouth daily.   naproxen sodium (ALEVE) 220 MG tablet Take 220 mg by mouth daily as needed (pain).   Omega-3 Fatty Acids (FISH OIL PO) Take 2 g by mouth daily.   OVER THE COUNTER MEDICATION Take 1 tablet by mouth daily. Instaflex otc supplement   pantoprazole (PROTONIX) 20 MG tablet Take 1 tablet (20 mg total) by mouth 2 (two) times daily. 30 min before food   polyethylene glycol (MIRALAX / GLYCOLAX) 17 g packet Take  17 g by mouth daily as needed for moderate constipation.   pyridoxine (B-6) 200 MG tablet Take 200 mg by mouth daily.   rosuvastatin (CRESTOR) 40 MG tablet Take 1 tablet (40 mg total) by mouth daily. At night   topiramate (TOPAMAX) 25 MG tablet Take 1 tablet (25 mg total) by mouth 3 (three) times daily.   traMADol (ULTRAM) 50 MG tablet Take 1-2 tablets (50-100 mg total) by mouth every 12 (twelve) hours as needed.   trolamine salicylate (ASPERCREME) 10 % cream Apply 1 application topically as needed for muscle pain.   trospium (SANCTURA) 20 MG tablet Take 1 tablet (20 mg total) by mouth 2 (two) times daily. Prn overactive bladder (Patient taking differently: Take 20 mg by mouth 2 (two) times daily.)   TURMERIC PO Take 538 mg by mouth daily.   Vitamin D, Ergocalciferol, (DRISDOL) 1.25 MG (50000 UNIT) CAPS capsule Take 1 capsule (50,000 Units total) by mouth every 7 (seven) days.   vitamin E 200 UNIT capsule Take 200 Units by mouth daily.   No facility-administered encounter medications on file as of 09/08/2021.    Allergies as of 09/08/2021 - Review Complete 08/21/2021  Allergen Reaction Noted   Aspirin Hives 08/04/2011   Coconut (cocos nucifera) Hives 07/10/2013   Lisinopril Cough 06/03/2006   Metoprolol Itching 02/12/2014   Other  02/27/2021   Peanut-containing drug products Hives and Itching 07/10/2013   Prednisone  07/12/2012   Strawberry extract Hives 07/10/2013   Penicillins Rash 06/03/2006    Past Medical History:  Diagnosis Date   Abdominal hernia    Abnormal Pap smear    ALLERGIC RHINITIS 10/22/2006   Allergy    ANEMIA 12/18/2008   Arthritis    scoliosis moderate deg changes lumbar Xray 11/04/17    Asthma    ASYMPTOMATIC POSTMENOPAUSAL STATUS 11/22/2007   Back pain    CAD (coronary artery disease)    in LAD   CHF (congestive heart failure) ([place])    Complication of anesthesia 2010   pt states she woke up during colonoscopy and was aware of "losing control of breathing"  during mammoplasty   Degenerative arthritis    Diverticulosis    Dyslipidemia    Fibroid    Frequent headaches    GERD 07/20/2007   GOITER, MULTINODULAR 07/20/2007   Headache(784.0) 07/20/2007   HEARING LOSS 11/22/2007   Hiatal hernia    Hiatal hernia    large   History of chicken pox    Hx of colposcopy with cervical biopsy    HYPERCHOLESTEROLEMIA 01/13/2007   Hyperglycemia    HYPERTENSION 10/22/2006   Lung nodule    unchanged since 05/26/13    Migraines    NASH (nonalcoholic steatohepatitis)    Nocturnal hypoxemia 02/17/2013   Obesity    Obstructive sleep apnea    OSA on CPAP    per neurology   OSTEOARTHRITIS 10/22/2006   Other chronic nonalcoholic liver disease 26/83/4196   Pre-diabetes    Sedimentation rate elevation  Sleep apnea    on cpap   Urine incontinence    UTI (urinary tract infection)     Past Surgical History:  Procedure Laterality Date   BREAST SURGERY  1984   Breast reduction b/l    CATARACT EXTRACTION, BILATERAL     DEXA  08/2005   DILATION AND CURETTAGE OF UTERUS     ELECTROCARDIOGRAM  10/15/2006   ESOPHAGOGASTRODUODENOSCOPY  12/08/2005   FOOT SURGERY     hammertoe and bunion 05/2017    Stress Cardiolite  10/21/2005   sweat gland removal     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   Procedure: Left TOTAL KNEE ARTHROPLASTY;  Surgeon: Mcarthur Rossetti, MD;  Location: [place];  Service: Orthopedics;  Laterality: Left;   WISDOM TOOTH EXTRACTION      Family History  Problem Relation Age of Onset   Asthma Mother    Depression Mother    Bipolar disorder Mother    Dementia Mother    Arthritis Mother    Breast cancer Mother        primary   Colon cancer Mother        mets from breast   Cancer Mother        colon   Hypertension Father    Migraines Father    Cancer Maternal Aunt        ?   Heart disease Maternal Grandmother    Cancer Maternal Grandfather        stomach ?   Sleep apnea Neg Hx     Social History   Socioeconomic  History   Marital status: Married    Spouse name: [name]   Number of children: 2   Years of education: College   Highest education level: Not on file  Occupational History   Occupation: Retired  Tobacco Use   Smoking status: Former    Packs/day: 2.00    Years: 20.00    Total pack years: 40.00    Types: Cigarettes    Quit date: 03/03/1979    Years since quitting: 42.5   Smokeless tobacco: Never  Vaping Use   Vaping Use: Never used  Substance and Sexual Activity   Alcohol use: Yes    Comment: rare - TWICE A YR   Drug use: No   Sexual activity: Yes    Birth control/protection: Surgical  Other Topics Concern   Not on file  Social History Narrative   Patient is married [name]).   Right Handed   Drinks 1 cup caffeine   Social Determinants of Health   Financial Resource Strain: Low Risk  (11/27/2020)   Overall Financial Resource Strain (CARDIA)    Difficulty of Paying Living Expenses: Not hard at all  Food Insecurity: No Food Insecurity (11/27/2020)   Hunger Vital Sign    Worried About Running Out of Food in the Last Year: Never true    Ran Out of Food in the Last Year: Never true  Transportation Needs: No Transportation Needs (11/27/2020)   PRAPARE - Hydrologist (Medical): No    Lack of Transportation (Non-Medical): No  Physical Activity: Sufficiently Active (11/24/2018)   Exercise Vital Sign    Days of Exercise per Week: 4 days    Minutes of Exercise per Session: 60 min  Stress: No Stress Concern Present (11/27/2020)   [place]    Feeling of Stress : Not at all  Social Connections: Unknown (11/27/2020)  Social Licensed conveyancer [NHANES]    Frequency of Communication with Friends and Family: Not on file    Frequency of Social Gatherings with Friends and Family: Not on file    Attends Religious Services: Not on file    Active Member of Clubs or Organizations: Not  on file    Attends Archivist Meetings: Not on file    Marital Status: Married  Intimate Partner Violence: Not At Risk (11/27/2020)   Humiliation, Afraid, Rape, and Kick questionnaire    Fear of Current or Ex-Partner: No    Emotionally Abused: No    Physically Abused: No    Sexually Abused: No      Review of systems: All other review of systems negative except as mentioned in the HPI.   Physical Exam: Vitals:   09/08/21 1034  BP: 120/66  Pulse: 74   Body mass index is 36.5 kg/m. Gen:      No acute distress HEENT:  sclera anicteric Abd:      soft, non-tender; no palpable masses, no distension Ext:    No edema Neuro: alert and oriented x 3 Psych: normal mood and affect  Data Reviewed:  Reviewed labs, radiology imaging, old records and pertinent past GI work up   Assessment and Plan/Recommendations:  76 year old very pleasant female with large hiatal hernia involving the entire stomach, part of colon and small bowel and large ventral hernia.   Episode of severe generalized abdominal pain associated with nausea, regurgitation and constipation is concerning for possible bowel obstruction or gastric volvulus, her symptoms have since resolved  Advised patient to come to ER if she has a severe episode where she is not tolerating any fluids, unable to pass flatus and has recurrent vomiting Patient does not want to proceed with hiatal hernia repair due to potential risks associated with it   GERD: Continue Protonix Discussed antireflux measures and lifestyle modifications     Family history of colon cancer and personal history of adenomatous colon polyps, due for surveillance colonoscopy, will schedule it. We will request clearance from prescribing provider to hold Plavix for 5 days prior to the procedure The risks and benefits as well as alternatives of endoscopic procedure(s) have been discussed and reviewed. All questions answered. The patient agrees to  proceed.  Chronic idiopathic constipation: Continue MiraLAX 1 capful daily to have daily soft bowel movement   The patient was provided an opportunity to ask questions and all were answered. The patient agreed with the plan and demonstrated an understanding of the instructions.  Damaris Hippo , MD    CC: McLean-Scocuzza, Olivia Mackie *

## 2021-09-09 ENCOUNTER — Encounter (HOSPITAL_COMMUNITY): Payer: Self-pay | Admitting: Cardiology

## 2021-09-09 ENCOUNTER — Other Ambulatory Visit (HOSPITAL_COMMUNITY): Payer: Self-pay | Admitting: *Deleted

## 2021-09-09 MED ORDER — EZETIMIBE 10 MG PO TABS: 10.0000 mg | ORAL_TABLET | Freq: Every day | ORAL | 3 refills | Status: DC

## 2021-09-09 NOTE — Telephone Encounter (Signed)
I will send a message to the [facility] to schedule an appt per the pre op provider.

## 2021-09-11 ENCOUNTER — Telehealth (HOSPITAL_COMMUNITY): Payer: Self-pay | Admitting: Cardiology

## 2021-09-11 ENCOUNTER — Telehealth: Payer: Self-pay | Admitting: *Deleted

## 2021-09-11 NOTE — Telephone Encounter (Signed)
Ok if no new symptoms.

## 2021-09-11 NOTE — Telephone Encounter (Signed)
Pre op needed for upcoming colonoscopy Last ov 04/2020 Will need to hold plavix x 5 days per GI   Ok to proceed? Or will you need to see her prior?

## 2021-09-11 NOTE — Telephone Encounter (Signed)
Dr Aundra Dubin, pt needs clearance to have Colonoscopy and hold Plavix 5 days prior.  Please advise if you will clear pt or if she needs appointment

## 2021-09-11 NOTE — Telephone Encounter (Signed)
I sent a message to [facility] today to see if they have been able to reach the pt for appt.

## 2021-09-12 ENCOUNTER — Encounter: Payer: Self-pay | Admitting: Gastroenterology

## 2021-09-12 ENCOUNTER — Telehealth: Payer: Self-pay | Admitting: Gastroenterology

## 2021-09-12 NOTE — Telephone Encounter (Signed)
Inbound call from patient wanting an update on when she needs to stop her Plavix. Please advise.

## 2021-09-12 NOTE — Telephone Encounter (Signed)
Please see previous phone note on this.......  Called pt to inform they have not been back in touch with our office. I resent the leter to the pre-op team and Dr Oleh Genin nurse Nira Conn

## 2021-09-12 NOTE — Telephone Encounter (Signed)
This has not been answered yet,. Her procedure is the 21st of July. She needs to know by Monday or her procedure may have to be cancelled  Thanks

## 2021-09-12 NOTE — Telephone Encounter (Signed)
The clearance request is in epic. I have copied the clearance and put in so pre op can see it.   Probably would be best if GI just faxes [name] a clearance so that we may enter in so tat all parties can see request.

## 2021-09-12 NOTE — Telephone Encounter (Signed)
Spoke with pt she is aware and denies new symptoms.

## 2021-09-12 NOTE — Telephone Encounter (Signed)
OK to hold Plavix 5 days for procedure.

## 2021-09-15 NOTE — Telephone Encounter (Signed)
See note from Dr. Aundra Dubin

## 2021-09-15 NOTE — Telephone Encounter (Signed)
See other phone note

## 2021-09-15 NOTE — Telephone Encounter (Signed)
I loeft message for patient to hold Plavix 5 days that would be including today . It seems by phone notes that pt should be aware

## 2021-09-16 DIAGNOSIS — H16223 Keratoconjunctivitis sicca, not specified as Sjogren's, bilateral: Secondary | ICD-10-CM | POA: Diagnosis not present

## 2021-09-19 ENCOUNTER — Encounter: Payer: Self-pay | Admitting: Gastroenterology

## 2021-09-19 ENCOUNTER — Ambulatory Visit (AMBULATORY_SURGERY_CENTER): Payer: Medicare Other | Admitting: Gastroenterology

## 2021-09-19 VITALS — BP 115/73 | HR 82 | Temp 97.7°F | Resp 19 | Ht 64.0 in | Wt 216.0 lb

## 2021-09-19 DIAGNOSIS — I2510 Atherosclerotic heart disease of native coronary artery without angina pectoris: Secondary | ICD-10-CM | POA: Diagnosis not present

## 2021-09-19 DIAGNOSIS — Z800 Family history of malignant neoplasm of digestive organs: Secondary | ICD-10-CM | POA: Diagnosis not present

## 2021-09-19 DIAGNOSIS — I509 Heart failure, unspecified: Secondary | ICD-10-CM | POA: Diagnosis not present

## 2021-09-19 DIAGNOSIS — G4733 Obstructive sleep apnea (adult) (pediatric): Secondary | ICD-10-CM | POA: Diagnosis not present

## 2021-09-19 DIAGNOSIS — Z09 Encounter for follow-up examination after completed treatment for conditions other than malignant neoplasm: Secondary | ICD-10-CM | POA: Diagnosis not present

## 2021-09-19 DIAGNOSIS — Z86010 Personal history of colonic polyps: Secondary | ICD-10-CM

## 2021-09-19 MED ORDER — SODIUM CHLORIDE 0.9 % IV SOLN: 500.0000 mL | Freq: Once | INTRAVENOUS | Status: DC

## 2021-09-19 MED ORDER — NA SULFATE-K SULFATE-MG SULF 17.5-3.13-1.6 GM/177ML PO SOLN: 1.0000 | Freq: Once | ORAL | 0 refills | Status: AC

## 2021-09-19 NOTE — Op Note (Addendum)
[facility] Patient Name: Heather [name] Procedure Date: 09/19/2021 1:46 PM MRN: [id number] Endoscopist: Mauri Pole , MD Age: 76 Referring MD:  Date of Birth: 11-07-1945 Gender: Female Account #: [id number] Procedure:                Colonoscopy Indications:              Screening patient at increased risk: Family history                            of 1st-degree relative with colorectal cancer at                            age 1 years (or older), High risk colon cancer                            surveillance: Personal history of colonic polyps Medicines:                Monitored Anesthesia Care Procedure:                Pre-Anesthesia Assessment:                           - Prior to the procedure, a History and Physical                            was performed, and patient medications and                            allergies were reviewed. The patient's tolerance of                            previous anesthesia was also reviewed. The risks                            and benefits of the procedure and the sedation                            options and risks were discussed with the patient.                            All questions were answered, and informed consent                            was obtained. Prior Anticoagulants: The patient                            last took Plavix (clopidogrel) 5 days prior to the                            procedure. ASA Grade Assessment: II - A patient                            with mild systemic disease. After reviewing the  risks and benefits, the patient was deemed in                            satisfactory condition to undergo the procedure.                           After obtaining informed consent, the colonoscope                            was passed under direct vision. Throughout the                            procedure, the patient's blood pressure, pulse, and                            oxygen  saturations were monitored continuously. The                            PCF-HQ190L Colonoscope was introduced through the                            anus and advanced to the the cecum, identified by                            appendiceal orifice and ileocecal valve. The                            colonoscopy was technically difficult and complex                            due to inadequate bowel prep. The patient tolerated                            the procedure well. The quality of the bowel                            preparation was not adequate to identify polyps 6                            mm and larger in size. The ileocecal valve,                            appendiceal orifice, and rectum were photographed. Scope In: 2:06:22 PM Scope Out: 2:25:38 PM Scope Withdrawal Time: 0 hours 10 minutes 20 seconds  Total Procedure Duration: 0 hours 19 minutes 16 seconds  Findings:                 The perianal and digital rectal examinations were                            normal.                           Scattered small and large-mouthed diverticula were  found in the entire colon.                           Non-bleeding external and internal hemorrhoids were                            found during retroflexion. The hemorrhoids were                            medium-sized. Complications:            No immediate complications. Estimated Blood Loss:     Estimated blood loss was minimal. Impression:               - Preparation of the colon was inadequate.                           - Moderate diverticulosis in the entire examined                            colon.                           - Non-bleeding external and internal hemorrhoids.                           - No specimens collected. Recommendation:           - Patient has a contact number available for                            emergencies. The signs and symptoms of potential                            delayed  complications were discussed with the                            patient. Return to normal activities tomorrow.                            Written discharge instructions were provided to the                            patient.                           - Resume previous diet.                           - Continue present medications.                           - Repeat colonoscopy at the next available                            appointment because the bowel preparation was  suboptimal.                           - For future colonoscopy the patient will require                            an extended preparation. If there are any                            questions, please contact the gastroenterologist.                           - Resume Plavix (clopidogrel) at prior dose today.                            Refer to managing physician for further adjustment                            of therapy. Mauri Pole, MD 09/19/2021 2:31:43 PM This report has been signed electronically.

## 2021-09-19 NOTE — Progress Notes (Signed)
A and O x3. Report to RN. Tolerated MAC anesthesia well. 

## 2021-09-19 NOTE — Progress Notes (Signed)
[facility] History and Physical   Primary Care Physician:  McLean-Scocuzza, [name], MD   Reason for Procedure:  Family h/o colon cancer. History of adenomatous colon polyps  Plan:    Surveillance colonoscopy with possible interventions as needed     HPI: Heather [name] is a very pleasant 76 y.o. female here for surveillance colonoscopy.   The risks and benefits as well as alternatives of endoscopic procedure(s) have been discussed and reviewed. All questions answered. The patient agrees to proceed.    Past Medical History:  Diagnosis Date   Abdominal hernia    Abnormal Pap smear    ALLERGIC RHINITIS 10/22/2006   Allergy    ANEMIA 12/18/2008   Arthritis    scoliosis moderate deg changes lumbar Xray 11/04/17    Asthma    ASYMPTOMATIC POSTMENOPAUSAL STATUS 11/22/2007   Back pain    CAD (coronary artery disease)    in LAD   CHF (congestive heart failure) ([place])    Complication of anesthesia 2010   pt states she woke up during colonoscopy and was aware of "losing control of breathing" during mammoplasty   Degenerative arthritis    Diverticulosis    Dyslipidemia    Fibroid    Frequent headaches    GERD 07/20/2007   GOITER, MULTINODULAR 07/20/2007   Headache(784.0) 07/20/2007   HEARING LOSS 11/22/2007   Hiatal hernia    Hiatal hernia    large   History of chicken pox    Hx of colposcopy with cervical biopsy    HYPERCHOLESTEROLEMIA 01/13/2007   Hyperglycemia    HYPERTENSION 10/22/2006   Lung nodule    unchanged since 05/26/13    Migraines    NASH (nonalcoholic steatohepatitis)    Nocturnal hypoxemia 02/17/2013   Obesity    Obstructive sleep apnea    OSA on CPAP    per neurology   OSTEOARTHRITIS 10/22/2006   Other chronic nonalcoholic liver disease 59/56/3875   Pre-diabetes    Sedimentation rate elevation    Sleep apnea    on cpap   Urine incontinence    UTI (urinary tract infection)     Past Surgical History:  Procedure Laterality Date    BREAST SURGERY  1984   Breast reduction b/l    CATARACT EXTRACTION, BILATERAL     DEXA  08/2005   DILATION AND CURETTAGE OF UTERUS     ELECTROCARDIOGRAM  10/15/2006   ESOPHAGOGASTRODUODENOSCOPY  12/08/2005   FOOT SURGERY     hammertoe and bunion 05/2017    Stress Cardiolite  10/21/2005   sweat gland removal     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   Procedure: Left TOTAL KNEE ARTHROPLASTY;  Surgeon: Mcarthur Rossetti, MD;  Location: [facility];  Service: Orthopedics;  Laterality: Left;   WISDOM TOOTH EXTRACTION      Prior to Admission medications   Medication Sig Start Date End Date Taking? Authorizing Provider  acetaminophen (TYLENOL) 500 MG tablet Take 1,000 mg by mouth 3 (three) times daily as needed for moderate pain.   Yes [provider]  Boswellia-Glucosamine-Vit D (OSTEO BI-FLEX ONE PER DAY PO) Take 1 tablet by mouth daily.   Yes [provider]  Calcium Carb-Cholecalciferol (CHEWABLE CALCIUM/D3 PO) Take 1 tablet by mouth daily.   Yes [provider]  cetirizine (ZYRTEC) 10 MG tablet Take 10 mg by mouth daily.   Yes [provider]  Cyanocobalamin 1000 MCG CAPS Take 1 capsule by mouth daily.   Yes [provider]  diclofenac  Sodium (VOLTAREN) 1 % GEL Apply 1 application topically 4 (four) times daily as needed (pain).   Yes [provider]  ezetimibe (ZETIA) 10 MG tablet Take 1 tablet (10 mg total) by mouth daily. 09/09/21  Yes Larey Dresser, MD  furosemide (LASIX) 40 MG tablet Take 1 tablet (40 mg total) by mouth daily as needed. 07/02/21  Yes McLean-Scocuzza, [name], MD  gabapentin (NEURONTIN) 100 MG capsule TAKE 3 CAPSULES BY MOUTH AT BEDTIME 07/02/21  Yes McLean-Scocuzza, [name], MD  KLOR-CON M20 20 MEQ tablet TAKE 1 TABLET DAILY 07/14/21  Yes McLean-Scocuzza, [name], MD  Multiple Vitamin (MULTIVITAMIN) tablet Take 1 tablet by mouth daily.   Yes [provider]  Omega-3 Fatty Acids (FISH OIL PO) Take 2 g by mouth  daily.   Yes [provider]  OVER THE COUNTER MEDICATION Take 1 tablet by mouth daily. Instaflex otc supplement   Yes [provider]  pantoprazole (PROTONIX) 20 MG tablet Take 1 tablet (20 mg total) by mouth 2 (two) times daily. 30 min before food 07/02/21  Yes McLean-Scocuzza, [name], MD  pyridoxine (B-6) 200 MG tablet Take 200 mg by mouth daily.   Yes [provider]  rosuvastatin (CRESTOR) 40 MG tablet Take 1 tablet (40 mg total) by mouth daily. At night 07/02/21  Yes McLean-Scocuzza, [name], MD  topiramate (TOPAMAX) 25 MG tablet Take 1 tablet (25 mg total) by mouth 3 (three) times daily. 09/01/21  Yes Suzzanne Cloud, NP  traMADol (ULTRAM) 50 MG tablet Take 1-2 tablets (50-100 mg total) by mouth every 12 (twelve) hours as needed. 07/02/21  Yes McLean-Scocuzza, [name], MD  trolamine salicylate (ASPERCREME) 10 % cream Apply 1 application topically as needed for muscle pain.   Yes [provider]  trospium (SANCTURA) 20 MG tablet Take 1 tablet (20 mg total) by mouth 2 (two) times daily. Prn overactive bladder Patient taking differently: Take 20 mg by mouth 2 (two) times daily. 02/21/21  Yes McLean-Scocuzza, [name], MD  TURMERIC PO Take 538 mg by mouth daily.   Yes [provider]  vitamin E 200 UNIT capsule Take 200 Units by mouth daily.   Yes [provider]  clopidogrel (PLAVIX) 75 MG tablet TAKE 1 TABLET DAILY 01/14/21   Larey Dresser, MD  Ferrous Sulfate (IRON) 325 (65 FE) MG TABS Take 1 tablet by mouth daily.    [provider]  HYDROcodone-acetaminophen (NORCO/VICODIN) 5-325 MG tablet Take 1 tablet by mouth every 6 (six) hours as needed for moderate pain. 04/21/21   Mcarthur Rossetti, MD  naproxen sodium (ALEVE) 220 MG tablet Take 220 mg by mouth daily as needed (pain).    [provider]  polyethylene glycol (MIRALAX / GLYCOLAX) 17 g packet Take 17 g by mouth daily as needed for moderate constipation.    [provider]  Vitamin D, Ergocalciferol, (DRISDOL) 1.25 MG (50000 UNIT) CAPS capsule Take 1 capsule (50,000 Units total) by mouth every 7 (seven) days. 08/13/21   [name] D, MD    Current Outpatient Medications  Medication Sig Dispense Refill   acetaminophen (TYLENOL) 500 MG tablet Take 1,000 mg by mouth 3 (three) times daily as needed for moderate pain.     Boswellia-Glucosamine-Vit D (OSTEO BI-FLEX ONE PER DAY PO) Take 1 tablet by mouth daily.     Calcium Carb-Cholecalciferol (CHEWABLE CALCIUM/D3 PO) Take 1 tablet by mouth daily.     cetirizine (ZYRTEC) 10 MG tablet Take 10  mg by mouth daily.     Cyanocobalamin 1000 MCG CAPS Take 1 capsule by mouth daily.     diclofenac Sodium (VOLTAREN) 1 % GEL Apply 1 application topically 4 (four) times daily as needed (pain).     ezetimibe (ZETIA) 10 MG tablet Take 1 tablet (10 mg total) by mouth daily. 90 tablet 3   furosemide (LASIX) 40 MG tablet Take 1 tablet (40 mg total) by mouth daily as needed. 90 tablet 3   gabapentin (NEURONTIN) 100 MG capsule TAKE 3 CAPSULES BY MOUTH AT BEDTIME 270 capsule 3   KLOR-CON M20 20 MEQ tablet TAKE 1 TABLET DAILY 90 tablet 3   Multiple Vitamin (MULTIVITAMIN) tablet Take 1 tablet by mouth daily.     Omega-3 Fatty Acids (FISH OIL PO) Take 2 g by mouth daily.     OVER THE COUNTER MEDICATION Take 1 tablet by mouth daily. Instaflex otc supplement     pantoprazole (PROTONIX) 20 MG tablet Take 1 tablet (20 mg total) by mouth 2 (two) times daily. 30 min before food 180 tablet 3   pyridoxine (B-6) 200 MG tablet Take 200 mg by mouth daily.     rosuvastatin (CRESTOR) 40 MG tablet Take 1 tablet (40 mg total) by mouth daily. At night 90 tablet 3   topiramate (TOPAMAX) 25 MG tablet Take 1 tablet (25 mg total) by mouth 3 (three) times daily. 270 tablet 1   traMADol (ULTRAM) 50 MG tablet Take 1-2 tablets (50-100 mg total) by mouth every 12 (twelve) hours as needed. 60 tablet 2   trolamine salicylate (ASPERCREME) 10 % cream  Apply 1 application topically as needed for muscle pain.     trospium (SANCTURA) 20 MG tablet Take 1 tablet (20 mg total) by mouth 2 (two) times daily. Prn overactive bladder (Patient taking differently: Take 20 mg by mouth 2 (two) times daily.) 60 tablet 11   TURMERIC PO Take 538 mg by mouth daily.     vitamin E 200 UNIT capsule Take 200 Units by mouth daily.     clopidogrel (PLAVIX) 75 MG tablet TAKE 1 TABLET DAILY 90 tablet 3   Ferrous Sulfate (IRON) 325 (65 FE) MG TABS Take 1 tablet by mouth daily.     HYDROcodone-acetaminophen (NORCO/VICODIN) 5-325 MG tablet Take 1 tablet by mouth every 6 (six) hours as needed for moderate pain. 30 tablet 0   naproxen sodium (ALEVE) 220 MG tablet Take 220 mg by mouth daily as needed (pain).     polyethylene glycol (MIRALAX / GLYCOLAX) 17 g packet Take 17 g by mouth daily as needed for moderate constipation.     Vitamin D, Ergocalciferol, (DRISDOL) 1.25 MG (50000 UNIT) CAPS capsule Take 1 capsule (50,000 Units total) by mouth every 7 (seven) days. 4 capsule 1   Current Facility-Administered Medications  Medication Dose Route Frequency Provider Last Rate Last Admin   0.9 %  sodium chloride infusion  500 mL Intravenous Once Mauri Pole, MD        Allergies as of 09/19/2021 - Review Complete 09/19/2021  Allergen Reaction Noted   Aspirin Hives 08/04/2011   Coconut (cocos nucifera) Hives 07/10/2013   Lisinopril Cough 06/03/2006   Metoprolol Itching 02/12/2014   Other  02/27/2021   Peanut-containing drug products Hives and Itching 07/10/2013   Prednisone  07/12/2012   Strawberry extract Hives 07/10/2013   Penicillins Rash 06/03/2006    Family History  Problem Relation Age of Onset   Asthma Mother    Depression Mother  Bipolar disorder Mother    Dementia Mother    Arthritis Mother    Breast cancer Mother        primary   Colon cancer Mother        mets from breast   Cancer Mother        colon   Hypertension Father    Migraines Father     Cancer Maternal Aunt        ?   Heart disease Maternal Grandmother    Cancer Maternal Grandfather        stomach ?   Sleep apnea Neg Hx    Esophageal cancer Neg Hx    Stomach cancer Neg Hx    Rectal cancer Neg Hx     Social History   Socioeconomic History   Marital status: Married    Spouse name: [name]   Number of children: 2   Years of education: College   Highest education level: Not on file  Occupational History   Occupation: Retired  Tobacco Use   Smoking status: Former    Packs/day: 2.00    Years: 20.00    Total pack years: 40.00    Types: Cigarettes    Quit date: 03/03/1979    Years since quitting: 42.5   Smokeless tobacco: Never  Vaping Use   Vaping Use: Never used  Substance and Sexual Activity   Alcohol use: Yes    Comment: rare - TWICE A YR   Drug use: No   Sexual activity: Yes    Birth control/protection: Surgical  Other Topics Concern   Not on file  Social History Narrative   Patient is married [name]).   Right Handed   Drinks 1 cup caffeine   Social Determinants of Health   Financial Resource Strain: Low Risk  (11/27/2020)   Overall Financial Resource Strain (CARDIA)    Difficulty of Paying Living Expenses: Not hard at all  Food Insecurity: No Food Insecurity (11/27/2020)   Hunger Vital Sign    Worried About Running Out of Food in the Last Year: Never true    Ran Out of Food in the Last Year: Never true  Transportation Needs: No Transportation Needs (11/27/2020)   PRAPARE - Hydrologist (Medical): No    Lack of Transportation (Non-Medical): No  Physical Activity: Sufficiently Active (11/24/2018)   Exercise Vital Sign    Days of Exercise per Week: 4 days    Minutes of Exercise per Session: 60 min  Stress: No Stress Concern Present (11/27/2020)   [place]    Feeling of Stress : Not at all  Social Connections: Unknown (11/27/2020)   Social  Connection and Isolation Panel [NHANES]    Frequency of Communication with Friends and Family: Not on file    Frequency of Social Gatherings with Friends and Family: Not on file    Attends Religious Services: Not on file    Active Member of Clubs or Organizations: Not on file    Attends Archivist Meetings: Not on file    Marital Status: Married  Intimate Partner Violence: Not At Risk (11/27/2020)   Humiliation, Afraid, Rape, and Kick questionnaire    Fear of Current or Ex-Partner: No    Emotionally Abused: No    Physically Abused: No    Sexually Abused: No    Review of Systems:  All other review of systems negative except as mentioned in the HPI.  Physical Exam:  Vital signs in last 24 hours: BP 121/73   Pulse 81   Temp 97.7 F (36.5 C)   Ht 5' 4"  (1.626 m)   Wt 216 lb (98 kg)   SpO2 98%   BMI 37.08 kg/m  General:   Alert, NAD Lungs:  Clear .   Heart:  Regular rate and rhythm Abdomen:  Soft, nontender and nondistended. Neuro/Psych:  Alert and cooperative. Normal mood and affect. A and O x 3  Reviewed labs, radiology imaging, old records and pertinent past GI work up  Patient is appropriate for planned procedure(s) and anesthesia in an ambulatory setting   K. Denzil Magnuson , MD [phone]

## 2021-09-19 NOTE — Patient Instructions (Addendum)
Resume previous diet and medications. Repeat Colonoscopy at next available appointment because the bowel preparation  was sub-optimal. May restart Plavix today.  YOU HAD AN ENDOSCOPIC PROCEDURE TODAY AT [facility]:   Refer to the procedure report that was given to you for any specific questions about what was found during the examination.  If the procedure report does not answer your questions, please call your gastroenterologist to clarify.  If you requested that your care partner not be given the details of your procedure findings, then the procedure report has been included in a sealed envelope for you to review at your convenience later.  YOU SHOULD EXPECT: Some feelings of bloating in the abdomen. Passage of more gas than usual.  Walking can help get rid of the air that was put into your GI tract during the procedure and reduce the bloating. If you had a lower endoscopy (such as a colonoscopy or flexible sigmoidoscopy) you may notice spotting of blood in your stool or on the toilet paper. If you underwent a bowel prep for your procedure, you may not have a normal bowel movement for a few days.  Please Note:  You might notice some irritation and congestion in your nose or some drainage.  This is from the oxygen used during your procedure.  There is no need for concern and it should clear up in a day or so.  SYMPTOMS TO REPORT IMMEDIATELY:  Following lower endoscopy (colonoscopy or flexible sigmoidoscopy):  Excessive amounts of blood in the stool  Significant tenderness or worsening of abdominal pains  Swelling of the abdomen that is new, acute  Fever of 100F or higher  For urgent or emergent issues, a gastroenterologist can be reached at any hour by calling [phone]. Do not use MyChart messaging for urgent concerns.    DIET:  We do recommend a small meal at first, but then you may proceed to your regular diet.  Drink plenty of fluids but you should avoid alcoholic  beverages for 24 hours.  ACTIVITY:  You should plan to take it easy for the rest of today and you should NOT DRIVE or use heavy machinery until tomorrow (because of the sedation medicines used during the test).    FOLLOW UP: Our staff will call the number listed on your records the next business day following your procedure.  We will call around 7:15- 8:00 am to check on you and address any questions or concerns that you may have regarding the information given to you following your procedure. If we do not reach you, we will leave a message.  If you develop any symptoms (ie: fever, flu-like symptoms, shortness of breath, cough etc.) before then, please call [phone].  If you test positive for Covid 19 in the 2 weeks post procedure, please call and report this information to [name].    If any biopsies were taken you will be contacted by phone or by letter within the next 1-3 weeks.  Please call us at [phone] if you have not heard about the biopsies in 3 weeks.    SIGNATURES/CONFIDENTIALITY: You and/or your care partner have signed paperwork which will be entered into your electronic medical record.  These signatures attest to the fact that that the information above on your After Visit Summary has been reviewed and is understood.  Full responsibility of the confidentiality of this discharge information lies with you and/or your care-partner.

## 2021-09-22 ENCOUNTER — Telehealth: Payer: Self-pay

## 2021-09-22 NOTE — Telephone Encounter (Signed)
  Follow up Call-     09/19/2021    1:16 PM  Call back number  Post procedure Call Back phone  # [phone]  Permission to leave phone message Yes     Patient questions:  Do you have a fever, pain , or abdominal swelling? No. Pain Score  0 *  Have you tolerated food without any problems? Yes.    Have you been able to return to your normal activities? Yes.    Do you have any questions about your discharge instructions: Diet   No. Medications  No. Follow up visit  No.  Do you have questions or concerns about your Care? No.  Actions: * If pain score is 4 or above: No action needed, pain <4.

## 2021-09-24 ENCOUNTER — Encounter (INDEPENDENT_AMBULATORY_CARE_PROVIDER_SITE_OTHER): Payer: Self-pay | Admitting: Family Medicine

## 2021-09-24 ENCOUNTER — Ambulatory Visit (INDEPENDENT_AMBULATORY_CARE_PROVIDER_SITE_OTHER): Payer: Medicare Other | Admitting: Family Medicine

## 2021-09-24 VITALS — BP 95/59 | HR 69 | Temp 97.9°F | Ht 64.0 in | Wt 212.0 lb

## 2021-09-24 DIAGNOSIS — F3289 Other specified depressive episodes: Secondary | ICD-10-CM

## 2021-09-24 DIAGNOSIS — E669 Obesity, unspecified: Secondary | ICD-10-CM

## 2021-09-24 DIAGNOSIS — E559 Vitamin D deficiency, unspecified: Secondary | ICD-10-CM | POA: Diagnosis not present

## 2021-09-24 DIAGNOSIS — E66813 Obesity, class 3: Secondary | ICD-10-CM

## 2021-09-24 DIAGNOSIS — Z6836 Body mass index (BMI) 36.0-36.9, adult: Secondary | ICD-10-CM

## 2021-09-24 MED ORDER — VITAMIN D (ERGOCALCIFEROL) 1.25 MG (50000 UNIT) PO CAPS: 50000.0000 [IU] | ORAL_CAPSULE | ORAL | 0 refills | Status: DC

## 2021-09-26 ENCOUNTER — Encounter (INDEPENDENT_AMBULATORY_CARE_PROVIDER_SITE_OTHER): Payer: Self-pay | Admitting: Family Medicine

## 2021-09-29 ENCOUNTER — Encounter: Payer: Self-pay | Admitting: Gastroenterology

## 2021-09-29 ENCOUNTER — Ambulatory Visit (AMBULATORY_SURGERY_CENTER): Payer: Medicare Other | Admitting: Gastroenterology

## 2021-09-29 VITALS — BP 108/60 | HR 63 | Temp 98.9°F | Resp 18 | Ht 64.0 in | Wt 216.0 lb

## 2021-09-29 DIAGNOSIS — J45909 Unspecified asthma, uncomplicated: Secondary | ICD-10-CM | POA: Diagnosis not present

## 2021-09-29 DIAGNOSIS — Z800 Family history of malignant neoplasm of digestive organs: Secondary | ICD-10-CM

## 2021-09-29 DIAGNOSIS — Z1211 Encounter for screening for malignant neoplasm of colon: Secondary | ICD-10-CM

## 2021-09-29 DIAGNOSIS — G4733 Obstructive sleep apnea (adult) (pediatric): Secondary | ICD-10-CM | POA: Diagnosis not present

## 2021-09-29 DIAGNOSIS — I2510 Atherosclerotic heart disease of native coronary artery without angina pectoris: Secondary | ICD-10-CM | POA: Diagnosis not present

## 2021-09-29 MED ORDER — SODIUM CHLORIDE 0.9 % IV SOLN: 500.0000 mL | Freq: Once | INTRAVENOUS | Status: DC

## 2021-09-29 NOTE — Progress Notes (Signed)
To pacu, VSS. Report to [email or web address] 

## 2021-09-29 NOTE — Progress Notes (Signed)
[facility] History and Physical   Primary Care Physician:  McLean-Scocuzza, [name], MD   Reason for Procedure:  Family history of colon cancer  Plan:    Screening colonoscopy with possible interventions as needed     HPI: Heather [name] is a very pleasant 76 y.o. female here for screening colonoscopy. Denies any nausea, vomiting, abdominal pain, melena or bright red blood per rectum  The risks and benefits as well as alternatives of endoscopic procedure(s) have been discussed and reviewed. All questions answered. The patient agrees to proceed.    Past Medical History:  Diagnosis Date   Abdominal hernia    Abnormal Pap smear    ALLERGIC RHINITIS 10/22/2006   Allergy    ANEMIA 12/18/2008   Arthritis    scoliosis moderate deg changes lumbar Xray 11/04/17    Asthma    ASYMPTOMATIC POSTMENOPAUSAL STATUS 11/22/2007   Back pain    CAD (coronary artery disease)    in LAD   CHF (congestive heart failure) ([place])    Complication of anesthesia 2010   pt states she woke up during colonoscopy and was aware of "losing control of breathing" during mammoplasty   Degenerative arthritis    Diverticulosis    Dyslipidemia    Fibroid    Frequent headaches    GERD 07/20/2007   GOITER, MULTINODULAR 07/20/2007   Headache(784.0) 07/20/2007   HEARING LOSS 11/22/2007   Hiatal hernia    Hiatal hernia    large   History of chicken pox    Hx of colposcopy with cervical biopsy    HYPERCHOLESTEROLEMIA 01/13/2007   Hyperglycemia    HYPERTENSION 10/22/2006   Lung nodule    unchanged since 05/26/13    Migraines    NASH (nonalcoholic steatohepatitis)    Nocturnal hypoxemia 02/17/2013   Obesity    Obstructive sleep apnea    OSA on CPAP    per neurology   OSTEOARTHRITIS 10/22/2006   Other chronic nonalcoholic liver disease 60/73/7106   Pre-diabetes    Sedimentation rate elevation    Sleep apnea    on cpap   Urine incontinence    UTI (urinary tract infection)     Past  Surgical History:  Procedure Laterality Date   BREAST SURGERY  1984   Breast reduction b/l    CATARACT EXTRACTION, BILATERAL     DEXA  08/2005   DILATION AND CURETTAGE OF UTERUS     ELECTROCARDIOGRAM  10/15/2006   ESOPHAGOGASTRODUODENOSCOPY  12/08/2005   FOOT SURGERY     hammertoe and bunion 05/2017    Stress Cardiolite  10/21/2005   sweat gland removal     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   Procedure: Left TOTAL KNEE ARTHROPLASTY;  Surgeon: Mcarthur Rossetti, MD;  Location: [facility];  Service: Orthopedics;  Laterality: Left;   WISDOM TOOTH EXTRACTION      Prior to Admission medications   Medication Sig Start Date End Date Taking? Authorizing Provider  acetaminophen (TYLENOL) 500 MG tablet Take 1,000 mg by mouth 3 (three) times daily as needed for moderate pain.   Yes [provider]  Boswellia-Glucosamine-Vit D (OSTEO BI-FLEX ONE PER DAY PO) Take 1 tablet by mouth daily.   Yes [provider]  Calcium Carb-Cholecalciferol (CHEWABLE CALCIUM/D3 PO) Take 1 tablet by mouth daily.   Yes [provider]  cetirizine (ZYRTEC) 10 MG tablet Take 10 mg by mouth daily.   Yes [provider]  Cyanocobalamin 1000 MCG CAPS Take 1 capsule by mouth daily.  Yes [provider]  ezetimibe (ZETIA) 10 MG tablet Take 1 tablet (10 mg total) by mouth daily. 09/09/21  Yes Larey Dresser, MD  furosemide (LASIX) 40 MG tablet Take 1 tablet (40 mg total) by mouth daily as needed. 07/02/21  Yes McLean-Scocuzza, [name], MD  gabapentin (NEURONTIN) 100 MG capsule TAKE 3 CAPSULES BY MOUTH AT BEDTIME 07/02/21  Yes McLean-Scocuzza, [name], MD  Multiple Vitamin (MULTIVITAMIN) tablet Take 1 tablet by mouth daily.   Yes [provider]  Omega-3 Fatty Acids (FISH OIL PO) Take 2 g by mouth daily.   Yes [provider]  OVER THE COUNTER MEDICATION Take 1 tablet by mouth daily. Instaflex otc supplement   Yes [provider]  pantoprazole (PROTONIX)  20 MG tablet Take 1 tablet (20 mg total) by mouth 2 (two) times daily. 30 min before food 07/02/21  Yes McLean-Scocuzza, [name], MD  polyethylene glycol (MIRALAX / GLYCOLAX) 17 g packet Take 17 g by mouth daily as needed for moderate constipation.   Yes [provider]  pyridoxine (B-6) 200 MG tablet Take 200 mg by mouth daily.   Yes [provider]  rosuvastatin (CRESTOR) 40 MG tablet Take 1 tablet (40 mg total) by mouth daily. At night 07/02/21  Yes McLean-Scocuzza, [name], MD  topiramate (TOPAMAX) 25 MG tablet Take 1 tablet (25 mg total) by mouth 3 (three) times daily. 09/01/21  Yes Suzzanne Cloud, NP  traMADol (ULTRAM) 50 MG tablet Take 1-2 tablets (50-100 mg total) by mouth every 12 (twelve) hours as needed. 07/02/21  Yes McLean-Scocuzza, [name], MD  trolamine salicylate (ASPERCREME) 10 % cream Apply 1 application topically as needed for muscle pain.   Yes [provider]  trospium (SANCTURA) 20 MG tablet Take 1 tablet (20 mg total) by mouth 2 (two) times daily. Prn overactive bladder Patient taking differently: Take 20 mg by mouth 2 (two) times daily. 02/21/21  Yes McLean-Scocuzza, [name], MD  TURMERIC PO Take 538 mg by mouth daily.   Yes [provider]  Vitamin D, Ergocalciferol, (DRISDOL) 1.25 MG (50000 UNIT) CAPS capsule Take 1 capsule (50,000 Units total) by mouth every 7 (seven) days. 09/24/21  Yes Beasley, Caren D, MD  vitamin E 200 UNIT capsule Take 200 Units by mouth daily.   Yes [provider]  clopidogrel (PLAVIX) 75 MG tablet TAKE 1 TABLET DAILY 01/14/21   Larey Dresser, MD  diclofenac Sodium (VOLTAREN) 1 % GEL Apply 1 application topically 4 (four) times daily as needed (pain).    [provider]  Ferrous Sulfate (IRON) 325 (65 FE) MG TABS Take 1 tablet by mouth daily.    [provider]  HYDROcodone-acetaminophen (NORCO/VICODIN) 5-325 MG tablet Take 1 tablet by mouth every 6 (six) hours as needed for moderate pain.  04/21/21   Mcarthur Rossetti, MD  KLOR-CON M20 20 MEQ tablet TAKE 1 TABLET DAILY 07/14/21   McLean-Scocuzza, [name], MD  naproxen sodium (ALEVE) 220 MG tablet Take 220 mg by mouth daily as needed (pain).    [provider]    Current Outpatient Medications  Medication Sig Dispense Refill   acetaminophen (TYLENOL) 500 MG tablet Take 1,000 mg by mouth 3 (three) times daily as needed for moderate pain.     Boswellia-Glucosamine-Vit D (OSTEO BI-FLEX ONE PER DAY PO) Take 1 tablet by mouth daily.     Calcium Carb-Cholecalciferol (CHEWABLE CALCIUM/D3 PO) Take 1 tablet by mouth daily.     cetirizine (  ZYRTEC) 10 MG tablet Take 10 mg by mouth daily.     Cyanocobalamin 1000 MCG CAPS Take 1 capsule by mouth daily.     ezetimibe (ZETIA) 10 MG tablet Take 1 tablet (10 mg total) by mouth daily. 90 tablet 3   furosemide (LASIX) 40 MG tablet Take 1 tablet (40 mg total) by mouth daily as needed. 90 tablet 3   gabapentin (NEURONTIN) 100 MG capsule TAKE 3 CAPSULES BY MOUTH AT BEDTIME 270 capsule 3   Multiple Vitamin (MULTIVITAMIN) tablet Take 1 tablet by mouth daily.     Omega-3 Fatty Acids (FISH OIL PO) Take 2 g by mouth daily.     OVER THE COUNTER MEDICATION Take 1 tablet by mouth daily. Instaflex otc supplement     pantoprazole (PROTONIX) 20 MG tablet Take 1 tablet (20 mg total) by mouth 2 (two) times daily. 30 min before food 180 tablet 3   polyethylene glycol (MIRALAX / GLYCOLAX) 17 g packet Take 17 g by mouth daily as needed for moderate constipation.     pyridoxine (B-6) 200 MG tablet Take 200 mg by mouth daily.     rosuvastatin (CRESTOR) 40 MG tablet Take 1 tablet (40 mg total) by mouth daily. At night 90 tablet 3   topiramate (TOPAMAX) 25 MG tablet Take 1 tablet (25 mg total) by mouth 3 (three) times daily. 270 tablet 1   traMADol (ULTRAM) 50 MG tablet Take 1-2 tablets (50-100 mg total) by mouth every 12 (twelve) hours as needed. 60 tablet 2   trolamine salicylate (ASPERCREME) 10 % cream  Apply 1 application topically as needed for muscle pain.     trospium (SANCTURA) 20 MG tablet Take 1 tablet (20 mg total) by mouth 2 (two) times daily. Prn overactive bladder (Patient taking differently: Take 20 mg by mouth 2 (two) times daily.) 60 tablet 11   TURMERIC PO Take 538 mg by mouth daily.     Vitamin D, Ergocalciferol, (DRISDOL) 1.25 MG (50000 UNIT) CAPS capsule Take 1 capsule (50,000 Units total) by mouth every 7 (seven) days. 4 capsule 0   vitamin E 200 UNIT capsule Take 200 Units by mouth daily.     clopidogrel (PLAVIX) 75 MG tablet TAKE 1 TABLET DAILY 90 tablet 3   diclofenac Sodium (VOLTAREN) 1 % GEL Apply 1 application topically 4 (four) times daily as needed (pain).     Ferrous Sulfate (IRON) 325 (65 FE) MG TABS Take 1 tablet by mouth daily.     HYDROcodone-acetaminophen (NORCO/VICODIN) 5-325 MG tablet Take 1 tablet by mouth every 6 (six) hours as needed for moderate pain. 30 tablet 0   KLOR-CON M20 20 MEQ tablet TAKE 1 TABLET DAILY 90 tablet 3   naproxen sodium (ALEVE) 220 MG tablet Take 220 mg by mouth daily as needed (pain).     Current Facility-Administered Medications  Medication Dose Route Frequency Provider Last Rate Last Admin   0.9 %  sodium chloride infusion  500 mL Intravenous Once Larita Deremer V, MD       0.9 %  sodium chloride infusion  500 mL Intravenous Once Mauri Pole, MD        Allergies as of 09/29/2021 - Review Complete 09/29/2021  Allergen Reaction Noted   Aspirin Hives 08/04/2011   Coconut (cocos nucifera) Hives 07/10/2013   Lisinopril Cough 06/03/2006   Metoprolol Itching 02/12/2014   Other  02/27/2021   Peanut-containing drug products Hives and Itching 07/10/2013   Prednisone  07/12/2012   Strawberry extract Hives  07/10/2013   Penicillins Rash 06/03/2006    Family History  Problem Relation Age of Onset   Asthma Mother    Depression Mother    Bipolar disorder Mother    Dementia Mother    Arthritis Mother    Breast cancer  Mother        primary   Colon cancer Mother        mets from breast   Cancer Mother        colon   Hypertension Father    Migraines Father    Cancer Maternal Aunt        ?   Heart disease Maternal Grandmother    Cancer Maternal Grandfather        stomach ?   Sleep apnea Neg Hx    Esophageal cancer Neg Hx    Stomach cancer Neg Hx    Rectal cancer Neg Hx     Social History   Socioeconomic History   Marital status: Married    Spouse name: [name]   Number of children: 2   Years of education: College   Highest education level: Not on file  Occupational History   Occupation: Retired  Tobacco Use   Smoking status: Former    Packs/day: 2.00    Years: 20.00    Total pack years: 40.00    Types: Cigarettes    Quit date: 03/03/1979    Years since quitting: 42.6   Smokeless tobacco: Never  Vaping Use   Vaping Use: Never used  Substance and Sexual Activity   Alcohol use: Yes    Comment: rare - TWICE A YR   Drug use: No   Sexual activity: Yes    Birth control/protection: Surgical  Other Topics Concern   Not on file  Social History Narrative   Patient is married [name]).   Right Handed   Drinks 1 cup caffeine   Social Determinants of Health   Financial Resource Strain: Low Risk  (11/27/2020)   Overall Financial Resource Strain (CARDIA)    Difficulty of Paying Living Expenses: Not hard at all  Food Insecurity: No Food Insecurity (11/27/2020)   Hunger Vital Sign    Worried About Running Out of Food in the Last Year: Never true    Ran Out of Food in the Last Year: Never true  Transportation Needs: No Transportation Needs (11/27/2020)   PRAPARE - Hydrologist (Medical): No    Lack of Transportation (Non-Medical): No  Physical Activity: Sufficiently Active (11/24/2018)   Exercise Vital Sign    Days of Exercise per Week: 4 days    Minutes of Exercise per Session: 60 min  Stress: No Stress Concern Present (11/27/2020)   [place]    Feeling of Stress : Not at all  Social Connections: Unknown (11/27/2020)   Social Connection and Isolation Panel [NHANES]    Frequency of Communication with Friends and Family: Not on file    Frequency of Social Gatherings with Friends and Family: Not on file    Attends Religious Services: Not on file    Active Member of Clubs or Organizations: Not on file    Attends Archivist Meetings: Not on file    Marital Status: Married  Intimate Partner Violence: Not At Risk (11/27/2020)   Humiliation, Afraid, Rape, and Kick questionnaire    Fear of Current or Ex-Partner: No    Emotionally Abused: No    Physically Abused:  No    Sexually Abused: No    Review of Systems:  All other review of systems negative except as mentioned in the HPI.  Physical Exam: Vital signs in last 24 hours: BP 113/72   Pulse 73   Temp 98.9 F (37.2 C)   Ht 5' 4"  (1.626 m)   Wt 216 lb (98 kg)   SpO2 97%   BMI 37.08 kg/m  General:   Alert, NAD Lungs:  Clear .   Heart:  Regular rate and rhythm Abdomen:  Soft, nontender and nondistended. Neuro/Psych:  Alert and cooperative. Normal mood and affect. A and O x 3  Reviewed labs, radiology imaging, old records and pertinent past GI work up  Patient is appropriate for planned procedure(s) and anesthesia in an ambulatory setting   K. Denzil Magnuson , MD [phone]

## 2021-09-29 NOTE — Progress Notes (Signed)
Pt's states no medical or surgical changes since previsit or office visit. 

## 2021-09-29 NOTE — Patient Instructions (Signed)
Resume Plavix at prior dose today.  Handout on diverticulosis and hemorrhoids given.  YOU HAD AN ENDOSCOPIC PROCEDURE TODAY AT [facility]:   Refer to the procedure report that was given to you for any specific questions about what was found during the examination.  If the procedure report does not answer your questions, please call your gastroenterologist to clarify.  If you requested that your care partner not be given the details of your procedure findings, then the procedure report has been included in a sealed envelope for you to review at your convenience later.  YOU SHOULD EXPECT: Some feelings of bloating in the abdomen. Passage of more gas than usual.  Walking can help get rid of the air that was put into your GI tract during the procedure and reduce the bloating. If you had a lower endoscopy (such as a colonoscopy or flexible sigmoidoscopy) you may notice spotting of blood in your stool or on the toilet paper. If you underwent a bowel prep for your procedure, you may not have a normal bowel movement for a few days.  Please Note:  You might notice some irritation and congestion in your nose or some drainage.  This is from the oxygen used during your procedure.  There is no need for concern and it should clear up in a day or so.  SYMPTOMS TO REPORT IMMEDIATELY:  Following lower endoscopy (colonoscopy or flexible sigmoidoscopy):  Excessive amounts of blood in the stool  Significant tenderness or worsening of abdominal pains  Swelling of the abdomen that is new, acute  Fever of 100F or higher  For urgent or emergent issues, a gastroenterologist can be reached at any hour by calling [phone]. Do not use MyChart messaging for urgent concerns.    DIET:  We do recommend a small meal at first, but then you may proceed to your regular diet.  Drink plenty of fluids but you should avoid alcoholic beverages for 24 hours.  ACTIVITY:  You should plan to take it easy for the  rest of today and you should NOT DRIVE or use heavy machinery until tomorrow (because of the sedation medicines used during the test).    FOLLOW UP: Our staff will call the number listed on your records the next business day following your procedure.  We will call around 7:15- 8:00 am to check on you and address any questions or concerns that you may have regarding the information given to you following your procedure. If we do not reach you, we will leave a message.  If you develop any symptoms (ie: fever, flu-like symptoms, shortness of breath, cough etc.) before then, please call [phone].  If you test positive for Covid 19 in the 2 weeks post procedure, please call and report this information to [name].    If any biopsies were taken you will be contacted by phone or by letter within the next 1-3 weeks.  Please call us at [phone] if you have not heard about the biopsies in 3 weeks.    SIGNATURES/CONFIDENTIALITY: You and/or your care partner have signed paperwork which will be entered into your electronic medical record.  These signatures attest to the fact that that the information above on your After Visit Summary has been reviewed and is understood.  Full responsibility of the confidentiality of this discharge information lies with you and/or your care-partner.

## 2021-09-29 NOTE — Op Note (Addendum)
[name] Procedure Date: 09/29/2021 2:54 PM MRN: [id number] Endoscopist: Mauri Pole , MD Age: 76 Referring MD:  Date of Birth: 1945/11/18 Gender: Female Account #: [id number] Procedure:                Colonoscopy Indications:              Screening patient at increased risk: Family history                            of 1st-degree relative with colorectal cancer at                            age 8 years (or older) Medicines:                Monitored Anesthesia Care Procedure:                Pre-Anesthesia Assessment:                           - Prior to the procedure, a History and Physical                            was performed, and patient medications and                            allergies were reviewed. The patient's tolerance of                            previous anesthesia was also reviewed. The risks                            and benefits of the procedure and the sedation                            options and risks were discussed with the patient.                            All questions were answered, and informed consent                            was obtained. Prior Anticoagulants: The patient                            last took Pletal (cilostazol) 14 days prior to the                            procedure. ASA Grade Assessment: III - A patient                            with severe systemic disease. After reviewing the                            risks and benefits, the patient was deemed in  satisfactory condition to undergo the procedure.                           After obtaining informed consent, the colonoscope                            was passed under direct vision. Throughout the                            procedure, the patient's blood pressure, pulse, and                            oxygen saturations were monitored continuously. The                            PCF-HQ190L Colonoscope  was introduced through the                            anus and advanced to the the cecum, identified by                            appendiceal orifice and ileocecal valve. The                            colonoscopy was performed without difficulty. The                            patient tolerated the procedure well. The quality                            of the bowel preparation was adequate. The                            ileocecal valve, appendiceal orifice, and rectum                            were photographed. Scope In: 3:07:45 PM Scope Out: 3:25:29 PM Scope Withdrawal Time: 0 hours 9 minutes 20 seconds  Total Procedure Duration: 0 hours 17 minutes 44 seconds  Findings:                 The perianal and digital rectal examinations were                            normal.                           Multiple small and large-mouthed diverticula were                            found in the sigmoid colon, descending colon,                            transverse colon, ascending colon and cecum.  Anal papilla(e) were hypertrophied.                           Non-bleeding external and internal hemorrhoids were                            found during retroflexion. The hemorrhoids were                            medium-sized. Complications:            No immediate complications. Estimated Blood Loss:     Estimated blood loss was minimal. Impression:               - Severe diverticulosis in the sigmoid colon, in                            the descending colon, in the transverse colon, in                            the ascending colon and in the cecum.                           - Anal papilla(e) were hypertrophied.                           - Non-bleeding external and internal hemorrhoids.                           - No specimens collected. Recommendation:           - Patient has a contact number available for                            emergencies. The signs and symptoms  of potential                            delayed complications were discussed with the                            patient. Return to normal activities tomorrow.                            Written discharge instructions were provided to the                            patient.                           - Resume previous diet.                           - Continue present medications.                           - No repeat colonoscopy due to age.                           -  Resume Plavix (clopidogrel) at prior dose today.                            Refer to managing physician for further adjustment                            of therapy. Mauri Pole, MD 09/29/2021 3:29:02 PM This report has been signed electronically.

## 2021-09-30 ENCOUNTER — Telehealth: Payer: Self-pay

## 2021-09-30 ENCOUNTER — Other Ambulatory Visit (INDEPENDENT_AMBULATORY_CARE_PROVIDER_SITE_OTHER): Payer: Self-pay | Admitting: Family Medicine

## 2021-09-30 DIAGNOSIS — E559 Vitamin D deficiency, unspecified: Secondary | ICD-10-CM

## 2021-09-30 MED ORDER — VITAMIN D (ERGOCALCIFEROL) 1.25 MG (50000 UNIT) PO CAPS
50000.0000 [IU] | ORAL_CAPSULE | ORAL | 0 refills | Status: DC
Start: 2021-09-30 — End: 2021-10-16

## 2021-09-30 NOTE — Telephone Encounter (Signed)
No answer, unable to leave a message, B.Latese Dufault RN.

## 2021-10-01 NOTE — Progress Notes (Unsigned)
Chief Complaint:   OBESITY Heather [name] is here to discuss her progress with her obesity treatment plan along with follow-up of her obesity related diagnoses. Heather [name] is on the Category 2 Plan and states she is following her eating plan approximately 2% of the time. Heather [name] states she is on the elliptical for 30 minutes 1 time per week.  Today's visit was #: 3 Starting weight: 268 lbs Starting date: 12/23/2016 Today's weight: 212 lbs Today's date: 09/24/2021 Total lbs lost to date: 26 Total lbs lost since last in-office visit: 0  Interim History:  Heather [name] has done more celebration eating.  She has not been concentrating on weight loss as much this month.  Subjective:   1. Vitamin D deficiency Heather [name] is stable on vitamin D, her last level was at goal.  2. Other depression with emotional eating Heather [name] has had increased stress with her recent health issues.  She notes being less mindful of her eating.  Assessment/Plan:   1. Vitamin D deficiency We will refill prescription Vitamin D 50,000 IU every week #4 for 1 month. Heather [name] will follow-up for routine testing of Vitamin D, at least 2-3 times per year to avoid over-replacement.  2. Other depression with emotional eating Heather [name] was offered support and encouragement to help her get back on track with being mindful of her eating.  3. Obesity, Current BMI 36.5 Heather [name] is currently in the action stage of change. As such, her goal is to continue with weight loss efforts. She has agreed to the Category 2 Plan.   Exercise goals: As is.   Behavioral modification strategies: increasing water intake.  Heather [name] has agreed to follow-up with our clinic in 3 to 4 weeks. She was informed of the importance of frequent follow-up visits to maximize her success with intensive lifestyle modifications for her multiple health conditions.   Objective:   Blood pressure (!) 95/59, pulse 69, temperature 97.9 F (36.6 C), height 5' 4"  (1.626 m), weight 212 lb (96.2  kg), SpO2 97 %. Body mass index is 36.39 kg/m.  General: Cooperative, alert, well developed, in no acute distress. HEENT: Conjunctivae and lids unremarkable. Cardiovascular: Regular rhythm.  Lungs: Normal work of breathing. Neurologic: No focal deficits.   Lab Results  Component Value Date   CREATININE 0.87 04/14/2021   BUN 14 04/14/2021   NA 143 04/14/2021   K 3.7 04/14/2021   CL 105 04/14/2021   CO2 25 04/14/2021   Lab Results  Component Value Date   ALT 10 04/14/2021   AST 23 04/14/2021   ALKPHOS 76 04/14/2021   BILITOT 0.2 04/14/2021   Lab Results  Component Value Date   HGBA1C 5.3 04/14/2021   HGBA1C 5.8 (H) 03/07/2021   HGBA1C 6.1 (H) 11/25/2020   HGBA1C 5.9 06/21/2020   HGBA1C 5.8 (H) 04/30/2020   Lab Results  Component Value Date   INSULIN 6.0 11/25/2020   INSULIN 4.3 10/09/2019   INSULIN 8.6 07/05/2019   INSULIN 6.4 12/05/2018   INSULIN 3.6 04/27/2018   Lab Results  Component Value Date   TSH 1.770 04/14/2021   Lab Results  Component Value Date   CHOL 149 11/25/2020   HDL 62 11/25/2020   LDLCALC 73 11/25/2020   TRIG 73 11/25/2020   CHOLHDL 2.1 07/17/2020   Lab Results  Component Value Date   VD25OH 55.3 04/14/2021   VD25OH 66.2 11/25/2020   VD25OH 58.1 04/30/2020   Lab Results  Component Value Date   WBC 3.6 04/14/2021   HGB  12.0 04/14/2021   HCT 38.3 04/14/2021   MCV 94 04/14/2021   PLT 238 04/14/2021   Lab Results  Component Value Date   IRON 42 04/14/2021   TIBC 231 (L) 04/14/2021   FERRITIN 338 (H) 04/14/2021   Attestation Statements:   Reviewed by clinician on day of visit: allergies, medications, problem list, medical history, surgical history, family history, social history, and previous encounter notes.  Time spent on visit including pre-visit chart review and post-visit care and charting was 30 minutes.   I, [name], am acting as transcriptionist for [name], MD.  I have reviewed the above documentation  for accuracy and completeness, and I agree with the above. -  [name], MD

## 2021-10-08 ENCOUNTER — Encounter (INDEPENDENT_AMBULATORY_CARE_PROVIDER_SITE_OTHER): Payer: Self-pay

## 2021-10-14 ENCOUNTER — Encounter: Payer: Self-pay | Admitting: Gastroenterology

## 2021-10-16 ENCOUNTER — Ambulatory Visit (INDEPENDENT_AMBULATORY_CARE_PROVIDER_SITE_OTHER): Payer: Medicare Other | Admitting: Family Medicine

## 2021-10-16 ENCOUNTER — Encounter (INDEPENDENT_AMBULATORY_CARE_PROVIDER_SITE_OTHER): Payer: Self-pay | Admitting: Family Medicine

## 2021-10-16 VITALS — BP 123/67 | HR 74 | Temp 98.0°F | Ht 64.0 in | Wt 211.0 lb

## 2021-10-16 DIAGNOSIS — F439 Reaction to severe stress, unspecified: Secondary | ICD-10-CM | POA: Diagnosis not present

## 2021-10-16 DIAGNOSIS — E559 Vitamin D deficiency, unspecified: Secondary | ICD-10-CM

## 2021-10-16 DIAGNOSIS — E669 Obesity, unspecified: Secondary | ICD-10-CM

## 2021-10-16 DIAGNOSIS — Z6836 Body mass index (BMI) 36.0-36.9, adult: Secondary | ICD-10-CM | POA: Diagnosis not present

## 2021-10-16 MED ORDER — VITAMIN D (ERGOCALCIFEROL) 1.25 MG (50000 UNIT) PO CAPS: 50000.0000 [IU] | ORAL_CAPSULE | ORAL | 0 refills | Status: DC

## 2021-10-24 ENCOUNTER — Ambulatory Visit: Payer: Self-pay | Admitting: *Deleted

## 2021-10-24 NOTE — Patient Instructions (Signed)
Visit Information  Thank you for taking time to visit with me today. Please don't hesitate to contact me if I can be of assistance to you.   Following are the goals we discussed today:   Goals Addressed             This Visit's Progress    care management activities       Care Coordination Interventions: Case Management Services offered/discussed SDOH screening completed Patient declined having any nursing or community resource needs at this time PCP appointment confirmed for 11/18/21           Please call the care guide team at [phone] if you need to schedule an appointment with your care management  team If you are experiencing a Mental Health or [place] or need someone to talk to, please call the Suicide and Crisis Lifeline: [phone]   Patient verbalizes understanding of instructions and care plan provided today and agrees to view in [place]. Active MyChart status and patient understanding of how to access instructions and care plan via MyChart confirmed with patient.     Next PCP appointment scheduled for: 11/18/21  Elliot Gurney, [place] Worker  [facility] Care Management [phone]

## 2021-10-24 NOTE — Patient Outreach (Signed)
  Care Coordination   Initial Visit Note   10/24/2021 Name: IKRAM RIEBE MRN: [id number] DOB: 08/30/1945  CATHERYNE DEFORD is a 76 y.o. year old female who sees McLean-Scocuzza, [name], MD for primary care. I spoke with  Cherlynn June by phone today.  What matters to the patients health and wellness today?  Patient denied having any nursing or community resource needs at this time    Goals Addressed             This Visit's Progress    care management activities       Care Coordination Interventions: Case Management Services offered/discussed SDOH screening completed Patient declined having any nursing or community resource needs at this time PCP appointment confirmed for 11/18/21         SDOH assessments and interventions completed:  Yes  SDOH Interventions Today    Flowsheet Row Most Recent Value  SDOH Interventions   Food Insecurity Interventions Intervention Not Indicated  Transportation Interventions Intervention Not Indicated        Care Coordination Interventions Activated:  Yes  Care Coordination Interventions:  Yes, provided   Follow up plan: No further intervention required.   Encounter Outcome:  Pt. Visit Completed

## 2021-10-27 NOTE — Progress Notes (Unsigned)
Chief Complaint:   OBESITY Heather [name] is here to discuss her progress with her obesity treatment plan along with follow-up of her obesity related diagnoses. Heather [name] is on the Category 2 Plan and states she is following her eating plan approximately 20% of the time. Heather [name] states she is on the elliptical for 30 minutes 3 times per week.  Today's visit was #: 1 Starting weight: 268 lbs Starting date: 12/23/2016 Today's weight: 211 lbs Today's date: 10/16/2021 Total lbs lost to date: 9 Total lbs lost since last in-office visit: 1  Interim History: Heather [name] continues to do well with her weight loss.  She did more social eating at her family reunion.  She has had a lot of health stressors for herself and her husband.  Subjective:   1. Vitamin D deficiency Heather [name] is on vitamin D prescription and her last labs were at goal.  2. Stress Heather [name] is dealing with a lot of family stress, which affects her emotional eating.  She is aware of this and she has made strategies to help manage her emotional eating behaviors.  Assessment/Plan:   1. Vitamin D deficiency Heather [name] will continue prescription Vitamin D 50,000 IU once weekly, and we will refill for 90 days.   - Vitamin D, Ergocalciferol, (DRISDOL) 1.25 MG (50000 UNIT) CAPS capsule; Take 1 capsule (50,000 Units total) by mouth every 7 (seven) days.  Dispense: 13 capsule; Refill: 0  2. Stress Heather [name] was offered support and encouragement, and I helped her make emotional eating behavior strategies.  3. Obesity, Current BMI 36.4 Heather [name] is currently in the action stage of change. As such, her goal is to continue with weight loss efforts. She has agreed to the Category 2 Plan.   Exercise goals: As is.   Behavioral modification strategies: increasing lean protein intake and meal planning and cooking strategies.  Heather [name] has agreed to follow-up with our clinic in 3 to 4 weeks. She was informed of the importance of frequent follow-up visits to maximize her  success with intensive lifestyle modifications for her multiple health conditions.   Objective:   Blood pressure 123/67, pulse 74, temperature 98 F (36.7 C), height 5' 4"  (1.626 m), weight 211 lb (95.7 kg), SpO2 98 %. Body mass index is 36.22 kg/m.  General: Cooperative, alert, well developed, in no acute distress. HEENT: Conjunctivae and lids unremarkable. Cardiovascular: Regular rhythm.  Lungs: Normal work of breathing. Neurologic: No focal deficits.   Lab Results  Component Value Date   CREATININE 0.87 04/14/2021   BUN 14 04/14/2021   NA 143 04/14/2021   K 3.7 04/14/2021   CL 105 04/14/2021   CO2 25 04/14/2021   Lab Results  Component Value Date   ALT 10 04/14/2021   AST 23 04/14/2021   ALKPHOS 76 04/14/2021   BILITOT 0.2 04/14/2021   Lab Results  Component Value Date   HGBA1C 5.3 04/14/2021   HGBA1C 5.8 (H) 03/07/2021   HGBA1C 6.1 (H) 11/25/2020   HGBA1C 5.9 06/21/2020   HGBA1C 5.8 (H) 04/30/2020   Lab Results  Component Value Date   INSULIN 6.0 11/25/2020   INSULIN 4.3 10/09/2019   INSULIN 8.6 07/05/2019   INSULIN 6.4 12/05/2018   INSULIN 3.6 04/27/2018   Lab Results  Component Value Date   TSH 1.770 04/14/2021   Lab Results  Component Value Date   CHOL 149 11/25/2020   HDL 62 11/25/2020   LDLCALC 73 11/25/2020   TRIG 73 11/25/2020   CHOLHDL 2.1 07/17/2020  Lab Results  Component Value Date   VD25OH 55.3 04/14/2021   VD25OH 66.2 11/25/2020   VD25OH 58.1 04/30/2020   Lab Results  Component Value Date   WBC 3.6 04/14/2021   HGB 12.0 04/14/2021   HCT 38.3 04/14/2021   MCV 94 04/14/2021   PLT 238 04/14/2021   Lab Results  Component Value Date   IRON 42 04/14/2021   TIBC 231 (L) 04/14/2021   FERRITIN 338 (H) 04/14/2021   Attestation Statements:   Reviewed by clinician on day of visit: allergies, medications, problem list, medical history, surgical history, family history, social history, and previous encounter notes.  Time spent on  visit including pre-visit chart review and post-visit care and charting was 45 minutes.   I, [name], am acting as transcriptionist for [name], MD.  I have reviewed the above documentation for accuracy and completeness, and I agree with the above. -  [name], MD

## 2021-10-29 ENCOUNTER — Telehealth (INDEPENDENT_AMBULATORY_CARE_PROVIDER_SITE_OTHER): Payer: Medicare Other | Admitting: Family Medicine

## 2021-10-29 DIAGNOSIS — Z20822 Contact with and (suspected) exposure to covid-19: Secondary | ICD-10-CM

## 2021-10-29 DIAGNOSIS — U071 COVID-19: Secondary | ICD-10-CM | POA: Diagnosis not present

## 2021-10-29 NOTE — Progress Notes (Signed)
[place] Telemedicine Visit  Patient consented to have virtual visit and was identified by name and date of birth. Method of visit: Video  Encounter participants: Patient: Heather [name] - located at home Provider: Carollee Leitz - located at office Others (if applicable): Daysie Helf (husband)  Chief Complaint: tested positive for COVID  HPI:   Patient reports she tested positive on home test.  Control line light pink, test line dark pink.  Denies any fevers, shortness of breath, chest pain, decrease in appetite.  Hydrating well. Unknown sick contacts.    ROS: per HPI  Pertinent PMHx:  HTN OSA Asthma  Exam:  There were no vitals taken for this visit.  Respiratory: Speaking in full sentences.  No increased work of breathing.    Assessment/Plan:  Positive self-administered antigen test for COVID-19 Likely home test positive.  Currently asymptomatic.   -Patient to come by clinic to have COVID test today to confirm. -Given no symptoms do not think Antiviral therapy needed at this time.  Discussed with patient that is starting to have symptoms to notify MD and can initiate antiviral treatment at that time. Patient agreeable to this -COVID precautions provided, per CDC guidelines -Strict ED precautions provided -Follow up as needed   PDMP Reviewed  Time spent during visit with patient: 20 minutes

## 2021-10-30 ENCOUNTER — Ambulatory Visit: Payer: Medicare Other | Admitting: Podiatry

## 2021-10-30 ENCOUNTER — Other Ambulatory Visit (INDEPENDENT_AMBULATORY_CARE_PROVIDER_SITE_OTHER): Payer: Medicare Other

## 2021-10-30 DIAGNOSIS — Z20822 Contact with and (suspected) exposure to covid-19: Secondary | ICD-10-CM | POA: Diagnosis not present

## 2021-10-30 LAB — POC COVID19 BINAXNOW: SARS Coronavirus 2 Ag: POSITIVE — AB

## 2021-10-30 NOTE — Progress Notes (Signed)
Positive results.  Continue strict COVID precautions.  If starting to have any symptoms please notify MD for possible antiviral therapy.

## 2021-11-02 ENCOUNTER — Ambulatory Visit: Payer: Medicare Other

## 2021-11-09 ENCOUNTER — Encounter: Payer: Self-pay | Admitting: Family Medicine

## 2021-11-09 DIAGNOSIS — U071 COVID-19: Secondary | ICD-10-CM | POA: Insufficient documentation

## 2021-11-09 NOTE — Assessment & Plan Note (Signed)
Likely home test positive.  Currently asymptomatic.   -Patient to come by clinic to have COVID test today to confirm. -Given no symptoms do not think Antiviral therapy needed at this time.  Discussed with patient that is starting to have symptoms to notify MD and can initiate antiviral treatment at that time. Patient agreeable to this -COVID precautions provided, per CDC guidelines -Strict ED precautions provided -Follow up as needed

## 2021-11-18 ENCOUNTER — Ambulatory Visit (INDEPENDENT_AMBULATORY_CARE_PROVIDER_SITE_OTHER): Payer: Medicare Other | Admitting: Family Medicine

## 2021-11-18 ENCOUNTER — Encounter (INDEPENDENT_AMBULATORY_CARE_PROVIDER_SITE_OTHER): Payer: Self-pay | Admitting: Family Medicine

## 2021-11-18 VITALS — BP 120/73 | HR 61 | Temp 97.9°F | Ht 64.0 in | Wt 215.0 lb

## 2021-11-18 DIAGNOSIS — Z6837 Body mass index (BMI) 37.0-37.9, adult: Secondary | ICD-10-CM

## 2021-11-18 DIAGNOSIS — E669 Obesity, unspecified: Secondary | ICD-10-CM | POA: Diagnosis not present

## 2021-11-18 DIAGNOSIS — F439 Reaction to severe stress, unspecified: Secondary | ICD-10-CM

## 2021-11-18 DIAGNOSIS — M79601 Pain in right arm: Secondary | ICD-10-CM

## 2021-11-20 NOTE — Progress Notes (Signed)
Subjective:    Patient ID: Heather [name], female    DOB: March 10, 1945, 76 y.o.   MRN: [id number]     HPI She is here to establish with a new pcp.  Heather [name] is here for follow up of her chronic medical problems, including  CAD, OSA, hld, DM, peripheral neuropathy, migraines, GERD w large hiatal hernia  S/p L TKR  - last couple of week has had increased pain.  She does follow with orthopedics.  Has OA - feels it has flared since having had covid.    Had covid recently - feels drained since then.  She is also has increased stress because of her husband's health and that may be contributing to the fatigue.  Strained right arm - she thinks she strained it trying to help her husband up.  Her ROM in arm has improved, but still weak.  She does see Dr. Tamala Julian next week.    Medications and allergies reviewed with patient and updated if appropriate.  Current Outpatient Medications on File Prior to Visit  Medication Sig Dispense Refill   acetaminophen (TYLENOL) 500 MG tablet Take 1,000 mg by mouth 3 (three) times daily as needed for moderate pain.     Boswellia-Glucosamine-Vit D (OSTEO BI-FLEX ONE PER DAY PO) Take 1 tablet by mouth daily.     Calcium Carb-Cholecalciferol (CHEWABLE CALCIUM/D3 PO) Take 1 tablet by mouth daily.     cetirizine (ZYRTEC) 10 MG tablet Take 10 mg by mouth daily.     clopidogrel (PLAVIX) 75 MG tablet TAKE 1 TABLET DAILY 90 tablet 3   Cyanocobalamin 1000 MCG CAPS Take 1 capsule by mouth daily.     ezetimibe (ZETIA) 10 MG tablet Take 1 tablet (10 mg total) by mouth daily. 90 tablet 3   Ferrous Sulfate (IRON) 325 (65 FE) MG TABS Take 1 tablet by mouth daily.     furosemide (LASIX) 40 MG tablet Take 1 tablet (40 mg total) by mouth daily as needed. 90 tablet 3   gabapentin (NEURONTIN) 100 MG capsule TAKE 3 CAPSULES BY MOUTH AT BEDTIME 270 capsule 3   KLOR-CON M20 20 MEQ tablet TAKE 1 TABLET DAILY 90 tablet 3   Multiple Vitamin (MULTIVITAMIN) tablet Take 1 tablet by  mouth daily.     naproxen sodium (ALEVE) 220 MG tablet Take 220 mg by mouth daily as needed (pain).     Omega-3 Fatty Acids (FISH OIL PO) Take 2 g by mouth daily.     pantoprazole (PROTONIX) 20 MG tablet Take 1 tablet (20 mg total) by mouth 2 (two) times daily. 30 min before food 180 tablet 3   polyethylene glycol (MIRALAX / GLYCOLAX) 17 g packet Take 17 g by mouth daily as needed for moderate constipation.     pyridoxine (B-6) 200 MG tablet Take 200 mg by mouth daily.     rosuvastatin (CRESTOR) 40 MG tablet Take 1 tablet (40 mg total) by mouth daily. At night 90 tablet 3   topiramate (TOPAMAX) 25 MG tablet Take 1 tablet (25 mg total) by mouth 3 (three) times daily. 270 tablet 1   traMADol (ULTRAM) 50 MG tablet Take 1-2 tablets (50-100 mg total) by mouth every 12 (twelve) hours as needed. 60 tablet 2   trolamine salicylate (ASPERCREME) 10 % cream Apply 1 application topically as needed for muscle pain.     trospium (SANCTURA) 20 MG tablet Take 1 tablet (20 mg total) by mouth 2 (two) times daily. Prn overactive bladder (Patient  taking differently: Take 20 mg by mouth 2 (two) times daily.) 60 tablet 11   TURMERIC PO Take 538 mg by mouth daily.     Vitamin D, Ergocalciferol, (DRISDOL) 1.25 MG (50000 UNIT) CAPS capsule Take 1 capsule (50,000 Units total) by mouth every 7 (seven) days. 13 capsule 0   vitamin E 200 UNIT capsule Take 200 Units by mouth daily.     No current facility-administered medications on file prior to visit.     Review of Systems  Constitutional:  Negative for chills and fever.  Respiratory:  Positive for shortness of breath (chronic - with exertion - no change). Negative for cough and wheezing.   Cardiovascular:  Positive for leg swelling (LLE > RLE). Negative for chest pain and palpitations.  Gastrointestinal:  Negative for abdominal pain, constipation and diarrhea.  Neurological:  Negative for light-headedness and headaches.  Psychiatric/Behavioral:  Negative for dysphoric  mood.        Objective:   Vitals:   11/21/21 1044  BP: 110/62  Pulse: 61  Temp: 98.1 F (36.7 C)  SpO2: 99%   BP Readings from Last 3 Encounters:  11/21/21 110/62  11/18/21 120/73  10/16/21 123/67   Wt Readings from Last 3 Encounters:  11/21/21 220 lb (99.8 kg)  11/18/21 215 lb (97.5 kg)  10/16/21 211 lb (95.7 kg)   Body mass index is 37.76 kg/m.    Physical Exam Constitutional:      General: She is not in acute distress.    Appearance: Normal appearance.  HENT:     Head: Normocephalic and atraumatic.  Eyes:     Conjunctiva/sclera: Conjunctivae normal.  Cardiovascular:     Rate and Rhythm: Normal rate and regular rhythm.     Heart sounds: Normal heart sounds. No murmur heard. Pulmonary:     Effort: Pulmonary effort is normal. No respiratory distress.     Breath sounds: Normal breath sounds. No wheezing.  Musculoskeletal:     Cervical back: Neck supple.     Right lower leg: Edema (Trace) present.     Left lower leg: Edema (Trace) present.  Lymphadenopathy:     Cervical: No cervical adenopathy.  Skin:    General: Skin is warm and dry.     Findings: No rash.  Neurological:     Mental Status: She is alert. Mental status is at baseline.  Psychiatric:        Mood and Affect: Mood normal.        Behavior: Behavior normal.        Lab Results  Component Value Date   WBC 3.6 04/14/2021   HGB 12.0 04/14/2021   HCT 38.3 04/14/2021   PLT 238 04/14/2021   GLUCOSE 100 (H) 04/14/2021   CHOL 149 11/25/2020   TRIG 73 11/25/2020   HDL 62 11/25/2020   LDLCALC 73 11/25/2020   ALT 10 04/14/2021   AST 23 04/14/2021   NA 143 04/14/2021   K 3.7 04/14/2021   CL 105 04/14/2021   CREATININE 0.87 04/14/2021   BUN 14 04/14/2021   CO2 25 04/14/2021   TSH 1.770 04/14/2021   INR 1.1 04/26/2018   HGBA1C 5.3 04/14/2021   MICROALBUR 0.4 04/26/2018     Assessment & Plan:    Flu vaccine today  See Problem List for Assessment and Plan of chronic medical problems.

## 2021-11-20 NOTE — Patient Instructions (Addendum)
     Blood work was ordered.       Medications changes include :   none      Return in about 6 months (around 05/22/2022) for follow up.

## 2021-11-21 ENCOUNTER — Other Ambulatory Visit (INDEPENDENT_AMBULATORY_CARE_PROVIDER_SITE_OTHER): Payer: Medicare Other

## 2021-11-21 ENCOUNTER — Encounter: Payer: Self-pay | Admitting: Internal Medicine

## 2021-11-21 ENCOUNTER — Ambulatory Visit (INDEPENDENT_AMBULATORY_CARE_PROVIDER_SITE_OTHER): Payer: Medicare Other | Admitting: Internal Medicine

## 2021-11-21 VITALS — BP 110/62 | HR 61 | Temp 98.1°F | Ht 64.0 in | Wt 220.0 lb

## 2021-11-21 DIAGNOSIS — I10 Essential (primary) hypertension: Secondary | ICD-10-CM | POA: Diagnosis not present

## 2021-11-21 DIAGNOSIS — G6281 Critical illness polyneuropathy: Secondary | ICD-10-CM

## 2021-11-21 DIAGNOSIS — I2510 Atherosclerotic heart disease of native coronary artery without angina pectoris: Secondary | ICD-10-CM

## 2021-11-21 DIAGNOSIS — E1169 Type 2 diabetes mellitus with other specified complication: Secondary | ICD-10-CM

## 2021-11-21 DIAGNOSIS — Z9989 Dependence on other enabling machines and devices: Secondary | ICD-10-CM | POA: Diagnosis not present

## 2021-11-21 DIAGNOSIS — Z23 Encounter for immunization: Secondary | ICD-10-CM | POA: Diagnosis not present

## 2021-11-21 DIAGNOSIS — E119 Type 2 diabetes mellitus without complications: Secondary | ICD-10-CM

## 2021-11-21 DIAGNOSIS — G43009 Migraine without aura, not intractable, without status migrainosus: Secondary | ICD-10-CM

## 2021-11-21 DIAGNOSIS — K449 Diaphragmatic hernia without obstruction or gangrene: Secondary | ICD-10-CM

## 2021-11-21 DIAGNOSIS — E559 Vitamin D deficiency, unspecified: Secondary | ICD-10-CM | POA: Diagnosis not present

## 2021-11-21 DIAGNOSIS — E785 Hyperlipidemia, unspecified: Secondary | ICD-10-CM

## 2021-11-21 DIAGNOSIS — R32 Unspecified urinary incontinence: Secondary | ICD-10-CM | POA: Diagnosis not present

## 2021-11-21 DIAGNOSIS — K219 Gastro-esophageal reflux disease without esophagitis: Secondary | ICD-10-CM | POA: Diagnosis not present

## 2021-11-21 DIAGNOSIS — G4733 Obstructive sleep apnea (adult) (pediatric): Secondary | ICD-10-CM | POA: Diagnosis not present

## 2021-11-21 LAB — CBC WITH DIFFERENTIAL/PLATELET
Basophils Absolute: 0 10*3/uL (ref 0.0–0.1)
Basophils Relative: 0.6 % (ref 0.0–3.0)
Eosinophils Absolute: 0.1 10*3/uL (ref 0.0–0.7)
Eosinophils Relative: 2.3 % (ref 0.0–5.0)
HCT: 38.3 % (ref 36.0–46.0)
Hemoglobin: 12.7 g/dL (ref 12.0–15.0)
Lymphocytes Relative: 38.8 % (ref 12.0–46.0)
Lymphs Abs: 1.4 10*3/uL (ref 0.7–4.0)
MCHC: 33.2 g/dL (ref 30.0–36.0)
MCV: 95.5 fl (ref 78.0–100.0)
Monocytes Absolute: 0.4 10*3/uL (ref 0.1–1.0)
Monocytes Relative: 11.2 % (ref 3.0–12.0)
Neutro Abs: 1.7 10*3/uL (ref 1.4–7.7)
Neutrophils Relative %: 47.1 % (ref 43.0–77.0)
Platelets: 152 10*3/uL (ref 150.0–400.0)
RBC: 4.01 Mil/uL (ref 3.87–5.11)
RDW: 14.9 % (ref 11.5–15.5)
WBC: 3.5 10*3/uL — ABNORMAL LOW (ref 4.0–10.5)

## 2021-11-21 LAB — COMPREHENSIVE METABOLIC PANEL
ALT: 19 U/L (ref 0–35)
AST: 26 U/L (ref 0–37)
Albumin: 3.8 g/dL (ref 3.5–5.2)
Alkaline Phosphatase: 55 U/L (ref 39–117)
BUN: 16 mg/dL (ref 6–23)
CO2: 28 mEq/L (ref 19–32)
Calcium: 9.3 mg/dL (ref 8.4–10.5)
Chloride: 106 mEq/L (ref 96–112)
Creatinine, Ser: 0.85 mg/dL (ref 0.40–1.20)
GFR: 67.18 mL/min (ref >60.00)
Glucose, Bld: 83 mg/dL (ref 70–99)
Potassium: 3.8 mEq/L (ref 3.5–5.1)
Sodium: 140 mEq/L (ref 135–145)
Total Bilirubin: 0.3 mg/dL (ref 0.2–1.2)
Total Protein: 7.5 g/dL (ref 6.0–8.3)

## 2021-11-21 LAB — MICROALBUMIN / CREATININE URINE RATIO
Creatinine,U: 76.4 mg/dL
Microalb Creat Ratio: 0.9 mg/g (ref 0.0–30.0)
Microalb, Ur: <0.7 mg/dL (ref 0.0–1.9)

## 2021-11-21 LAB — LIPID PANEL
Cholesterol: 141 mg/dL (ref 0–200)
HDL: 61.6 mg/dL (ref >39.00)
LDL Cholesterol: 62 mg/dL (ref 0–99)
NonHDL: 78.92
Total CHOL/HDL Ratio: 2
Triglycerides: 87 mg/dL (ref 0.0–149.0)
VLDL: 17.4 mg/dL (ref 0.0–40.0)

## 2021-11-21 LAB — VITAMIN D 25 HYDROXY (VIT D DEFICIENCY, FRACTURES): VITD: 48.25 ng/mL (ref 30.00–100.00)

## 2021-11-21 NOTE — Assessment & Plan Note (Signed)
Chronic CAD seen on imaging Following with Dr. Benjamine Mola Plan Plavix 75 mg daily, Zetia 10 mg daily and Crestor 40 mg daily Currently making changes in her diet

## 2021-11-21 NOTE — Assessment & Plan Note (Signed)
Chronic Taking vitamin D daily Check vitamin D level

## 2021-11-21 NOTE — Assessment & Plan Note (Signed)
Chronic GERD controlled Continue pantoprazole 20 mg bid

## 2021-11-21 NOTE — Progress Notes (Unsigned)
Heather [name] [address] [place] Phone: [phone] Subjective:   IVilma Meckel, am serving as a scribe for Dr. Hulan Saas.  I'm seeing this patient by the request  of:  [name], MD  CC: Right arm pain  PZW:CHENIDPOEU  Heather [name] is a 76 y.o. female coming in with complaint of R arm pain. Last seen in December 2022 for B knee pain. TKR L January 2023. Patient states husband fell and was helping him up about a month ago. Felt pain in right bicep after that. Did have covid around the same time. Pain with IR and shoulder abduction. ROM is better. Heat helped the shoulder pain and takes Tylenol on the regular.      Past Medical History:  Diagnosis Date   Abdominal hernia    Abnormal Pap smear    ALLERGIC RHINITIS 10/22/2006   Allergy    ANEMIA 12/18/2008   Arthritis    scoliosis moderate deg changes lumbar Xray 11/04/17    Asthma    ASYMPTOMATIC POSTMENOPAUSAL STATUS 11/22/2007   Back pain    CAD (coronary artery disease)    in LAD   CHF (congestive heart failure) ([place])    Complication of anesthesia 2010   pt states she woke up during colonoscopy and was aware of "losing control of breathing" during mammoplasty   Degenerative arthritis    Diverticulosis    Dyslipidemia    Fibroid    Frequent headaches    GERD 07/20/2007   GOITER, MULTINODULAR 07/20/2007   Headache(784.0) 07/20/2007   HEARING LOSS 11/22/2007   Hiatal hernia    Hiatal hernia    large   History of chicken pox    Hx of colposcopy with cervical biopsy    HYPERCHOLESTEROLEMIA 01/13/2007   Hyperglycemia    HYPERTENSION 10/22/2006   Lung nodule    unchanged since 05/26/13    Migraines    NASH (nonalcoholic steatohepatitis)    Nocturnal hypoxemia 02/17/2013   Obesity    Obstructive sleep apnea    OSA on CPAP    per neurology   OSTEOARTHRITIS 10/22/2006   Other chronic nonalcoholic liver disease 23/53/6144   Pre-diabetes    Sedimentation  rate elevation    Sleep apnea    on cpap   Urine incontinence    UTI (urinary tract infection)    Past Surgical History:  Procedure Laterality Date   BREAST SURGERY  1984   Breast reduction b/l    CATARACT EXTRACTION, BILATERAL     DEXA  08/2005   DILATION AND CURETTAGE OF UTERUS     ELECTROCARDIOGRAM  10/15/2006   ESOPHAGOGASTRODUODENOSCOPY  12/08/2005   FOOT SURGERY     hammertoe and bunion 05/2017    Stress Cardiolite  10/21/2005   sweat gland removal     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   Procedure: Left TOTAL KNEE ARTHROPLASTY;  Surgeon: Mcarthur Rossetti, MD;  Location: [facility];  Service: Orthopedics;  Laterality: Left;   WISDOM TOOTH EXTRACTION     Social History   Socioeconomic History   Marital status: Married    Spouse name: [name]   Number of children: 2   Years of education: College   Highest education level: Not on file  Occupational History   Occupation: Retired  Tobacco Use   Smoking status: Former    Packs/day: 2.00    Years: 20.00    Total pack years: 40.00    Types: Cigarettes  Quit date: 03/03/1979    Years since quitting: 42.7   Smokeless tobacco: Never  Vaping Use   Vaping Use: Never used  Substance and Sexual Activity   Alcohol use: Yes    Comment: rare - TWICE A YR   Drug use: No   Sexual activity: Yes    Birth control/protection: Surgical  Other Topics Concern   Not on file  Social History Narrative   Patient is married [name]).   Right Handed   Drinks 1 cup caffeine   Social Determinants of Health   Financial Resource Strain: Low Risk  (11/27/2020)   Overall Financial Resource Strain (CARDIA)    Difficulty of Paying Living Expenses: Not hard at all  Food Insecurity: No Food Insecurity (10/24/2021)   Hunger Vital Sign    Worried About Running Out of Food in the Last Year: Never true    Ran Out of Food in the Last Year: Never true  Transportation Needs: No Transportation Needs (10/24/2021)   PRAPARE - Armed forces logistics/support/administrative officer (Medical): No    Lack of Transportation (Non-Medical): No  Physical Activity: Sufficiently Active (11/24/2018)   Exercise Vital Sign    Days of Exercise per Week: 4 days    Minutes of Exercise per Session: 60 min  Stress: No Stress Concern Present (11/27/2020)   [place]    Feeling of Stress : Not at all  Social Connections: Unknown (11/27/2020)   Social Connection and Isolation Panel [NHANES]    Frequency of Communication with Friends and Family: Not on file    Frequency of Social Gatherings with Friends and Family: Not on file    Attends Religious Services: Not on file    Active Member of Clubs or Organizations: Not on file    Attends Archivist Meetings: Not on file    Marital Status: Married   Allergies  Allergen Reactions   Aspirin Hives   Coconut (Cocos Nucifera) Hives   Lisinopril Cough   Metoprolol Itching   Other     Latex paint, the smell causes hives. Not allergic to latex gloves   General anesthesia - has had issues in past where anesthesia was not properly administered and caused side effects   Peanut-Containing Drug Products Hives and Itching   Prednisone     Unknown reaction    Strawberry Extract Hives   Penicillins Rash   Family History  Problem Relation Age of Onset   Asthma Mother    Depression Mother    Bipolar disorder Mother    Dementia Mother    Arthritis Mother    Breast cancer Mother        primary   Colon cancer Mother        mets from breast   Cancer Mother        colon   Hypertension Father    Migraines Father    Cancer Maternal Aunt        ?   Heart disease Maternal Grandmother    Cancer Maternal Grandfather        stomach ?   Sleep apnea Neg Hx    Esophageal cancer Neg Hx    Stomach cancer Neg Hx    Rectal cancer Neg Hx      Current Outpatient Medications (Cardiovascular):    ezetimibe (ZETIA) 10 MG tablet, Take 1 tablet (10 mg  total) by mouth daily.   furosemide (LASIX) 40 MG tablet, Take 1  tablet (40 mg total) by mouth daily as needed.   rosuvastatin (CRESTOR) 40 MG tablet, Take 1 tablet (40 mg total) by mouth daily. At night  Current Outpatient Medications (Respiratory):    cetirizine (ZYRTEC) 10 MG tablet, Take 10 mg by mouth daily.  Current Outpatient Medications (Analgesics):    acetaminophen (TYLENOL) 500 MG tablet, Take 1,000 mg by mouth 3 (three) times daily as needed for moderate pain.   naproxen sodium (ALEVE) 220 MG tablet, Take 220 mg by mouth daily as needed (pain).   traMADol (ULTRAM) 50 MG tablet, Take 1-2 tablets (50-100 mg total) by mouth every 12 (twelve) hours as needed.  Current Outpatient Medications (Hematological):    clopidogrel (PLAVIX) 75 MG tablet, TAKE 1 TABLET DAILY   Cyanocobalamin 1000 MCG CAPS, Take 1 capsule by mouth daily.   Ferrous Sulfate (IRON) 325 (65 FE) MG TABS, Take 1 tablet by mouth daily.  Current Outpatient Medications (Other):    Boswellia-Glucosamine-Vit D (OSTEO BI-FLEX ONE PER DAY PO), Take 1 tablet by mouth daily.   Calcium Carb-Cholecalciferol (CHEWABLE CALCIUM/D3 PO), Take 1 tablet by mouth daily.   gabapentin (NEURONTIN) 100 MG capsule, TAKE 3 CAPSULES BY MOUTH AT BEDTIME   KLOR-CON M20 20 MEQ tablet, TAKE 1 TABLET DAILY   Multiple Vitamin (MULTIVITAMIN) tablet, Take 1 tablet by mouth daily.   Omega-3 Fatty Acids (FISH OIL PO), Take 2 g by mouth daily.   pantoprazole (PROTONIX) 20 MG tablet, Take 1 tablet (20 mg total) by mouth 2 (two) times daily. 30 min before food   polyethylene glycol (MIRALAX / GLYCOLAX) 17 g packet, Take 17 g by mouth daily as needed for moderate constipation.   pyridoxine (B-6) 200 MG tablet, Take 200 mg by mouth daily.   topiramate (TOPAMAX) 25 MG tablet, Take 1 tablet (25 mg total) by mouth 3 (three) times daily.   trolamine salicylate (ASPERCREME) 10 % cream, Apply 1 application topically as needed for muscle pain.   trospium  (SANCTURA) 20 MG tablet, Take 1 tablet (20 mg total) by mouth 2 (two) times daily. Prn overactive bladder (Patient taking differently: Take 20 mg by mouth 2 (two) times daily.)   TURMERIC PO, Take 538 mg by mouth daily.   Vitamin D, Ergocalciferol, (DRISDOL) 1.25 MG (50000 UNIT) CAPS capsule, Take 1 capsule (50,000 Units total) by mouth every 7 (seven) days.   vitamin E 200 UNIT capsule, Take 200 Units by mouth daily.   Reviewed prior external information including notes and imaging from  primary care provider As well as notes that were available from care everywhere and other healthcare systems.  Past medical history, social, surgical and family history all reviewed in electronic medical record.  No pertanent information unless stated regarding to the chief complaint.   Review of Systems:  No headache, visual changes, nausea, vomiting, diarrhea, constipation, dizziness, abdominal pain, skin rash, fevers, chills, night sweats, weight loss, swollen lymph nodes, body aches, joint swelling, chest pain, shortness of breath, mood changes. POSITIVE muscle aches  Objective  Blood pressure 122/64, pulse 74, height 5' 4"  (1.626 m), weight 222 lb (100.7 kg), SpO2 95 %.   General: No apparent distress alert and oriented x3 mood and affect normal, dressed appropriately.  HEENT: Pupils equal, extraocular movements intact  Respiratory: Patient's speak in full sentences and does not appear short of breath  Cardiovascular: No lower extremity edema, non tender, no erythema  Right arm exam shows the patient does have positive impingement noted.  Patient does have positive Neer  and Hawkins.  Patient's rotator cuff does seem to be guarded.  The above documentation has been reviewed and is accurate and complete Lyndal Pulley, DO Limited ultrasound of the right arm shows the patient does have a supraspinatus tear noted.  1 area but does appear to be full-thickness.  No significant retraction noted with  increasing in Doppler flow.  Moderate narrowing of the acromioclavicular joint noted with hypoechoic changes as well. Impression: Rotator cuff tear    Impression and Recommendations:    The above documentation has been reviewed and is accurate and complete Lyndal Pulley, DO

## 2021-11-21 NOTE — Assessment & Plan Note (Signed)
Chronic Uses her machine nightly Was on oxygen at 1 point at night but no longer needs it

## 2021-11-21 NOTE — Assessment & Plan Note (Signed)
Chronic Following with neurology-Sarah Slack Migraines controlled Continue Topamax 25 mg 3 times daily

## 2021-11-21 NOTE — Assessment & Plan Note (Signed)
Chronic Regular exercise and healthy diet encouraged Check lipid panel  Continue Zetia 10 mg daily, Crestor 40 mg daily

## 2021-11-21 NOTE — Assessment & Plan Note (Signed)
Chronic  Lab Results  Component Value Date   HGBA1C 5.3 04/14/2021   Sugars very well controlled Check A1c, urine microalbumin today Continue lifestyle control Stressed regular exercise, diabetic diet

## 2021-11-21 NOTE — Assessment & Plan Note (Signed)
Chronic On trospium 20 mg twice daily-continue

## 2021-11-21 NOTE — Assessment & Plan Note (Signed)
Chronic Has learned how much to eat and does not go over Has 2-3 flares/ year - intense gripping pain across lower chest - can last hours to one one day Takes pantoprazole 20 mg bid

## 2021-11-21 NOTE — Assessment & Plan Note (Signed)
Chronic Taking tramadol 50-100 mg every 12 hours as needed, gabapentin to 100 mg at bedtime Sees podiatry

## 2021-11-24 ENCOUNTER — Encounter: Payer: Self-pay | Admitting: Family Medicine

## 2021-11-24 ENCOUNTER — Ambulatory Visit (INDEPENDENT_AMBULATORY_CARE_PROVIDER_SITE_OTHER): Payer: Medicare Other | Admitting: Family Medicine

## 2021-11-24 ENCOUNTER — Ambulatory Visit: Payer: Self-pay

## 2021-11-24 VITALS — BP 122/64 | HR 74 | Ht 64.0 in | Wt 222.0 lb

## 2021-11-24 DIAGNOSIS — M75101 Unspecified rotator cuff tear or rupture of right shoulder, not specified as traumatic: Secondary | ICD-10-CM | POA: Diagnosis not present

## 2021-11-24 DIAGNOSIS — I2510 Atherosclerotic heart disease of native coronary artery without angina pectoris: Secondary | ICD-10-CM

## 2021-11-24 DIAGNOSIS — M79601 Pain in right arm: Secondary | ICD-10-CM

## 2021-11-24 DIAGNOSIS — S46811A Strain of other muscles, fascia and tendons at shoulder and upper arm level, right arm, initial encounter: Secondary | ICD-10-CM

## 2021-11-24 LAB — HEMOGLOBIN A1C: Hgb A1c MFr Bld: 6.1 % (ref 4.6–6.5)

## 2021-11-24 NOTE — Patient Instructions (Addendum)
PT referral to [facility] on [address] to see you!

## 2021-11-24 NOTE — Assessment & Plan Note (Signed)
Patient does have a rotator cuff tear noted.  Patient does have some underlying arthritis that could be contributing as well.  Discussed with patient about icing regimen and home exercises, which activities to do and which ones to avoid.  Follow-up again in 6 to 8 weeks.  Worsening pain after formal physical therapy will consider injection or advanced imaging.

## 2021-11-25 ENCOUNTER — Ambulatory Visit (INDEPENDENT_AMBULATORY_CARE_PROVIDER_SITE_OTHER): Payer: Medicare Other | Admitting: Podiatry

## 2021-11-25 DIAGNOSIS — M79676 Pain in unspecified toe(s): Secondary | ICD-10-CM | POA: Diagnosis not present

## 2021-11-25 DIAGNOSIS — B351 Tinea unguium: Secondary | ICD-10-CM

## 2021-11-25 DIAGNOSIS — D689 Coagulation defect, unspecified: Secondary | ICD-10-CM | POA: Diagnosis not present

## 2021-11-25 DIAGNOSIS — D2372 Other benign neoplasm of skin of left lower limb, including hip: Secondary | ICD-10-CM | POA: Diagnosis not present

## 2021-11-25 DIAGNOSIS — D2371 Other benign neoplasm of skin of right lower limb, including hip: Secondary | ICD-10-CM

## 2021-11-25 NOTE — Progress Notes (Signed)
Chief Complaint:   OBESITY Heather [name] is here to discuss her progress with her obesity treatment plan along with follow-up of her obesity related diagnoses. Heather [name] is on the Category 2 Plan and states she is following her eating plan approximately 2% of the time. Heather [name] states she is doing 0 minutes 0 times per week.  Today's visit was #: 59 Starting weight: 268 lbs Starting date: 12/23/2016 Today's weight: 215 lbs Today's date: 11/18/2021 Total lbs lost to date: 56 Total lbs lost since last in-office visit: 0  Interim History: Heather [name] hasn't been able to concentrate on weight loss in the last month. She has been concentrating on caring for her husband who has been very sick. She has also been sick with COVID and she is very fatigued with minimal cooking.   Subjective:   1. Stress Heather [name]'s husband has had multiple falls recently. He was diagnosed with acute liner failure and dehydration with no history of liver issues recently. She is recovering from Heather [name] and she has significant fatigue while caring for her husband.   2. Right arm pain Heather [name] strained her arm and shoulder while helping her husband after falling in the bathroom.   Assessment/Plan:   1. Stress Heather [name] was offered support and encouragement, and ways to get support from her family and friends.   2. Right arm pain We will refer Heather [name] to Dr. Tamala Julian or Dr. Georgina [name] of Sports Medicine.   - Ambulatory referral to Sports Medicine  3. Obesity, Current BMI 37.0 Heather [name] is currently in the action stage of change. As such, her goal is to maintain weight for now. She has agreed to the Category 2 Plan.   Heather [name]'s goal is to maintain her weight while recovering. We will follow up in 3 weeks to see how she is doing and see if she is  ready to restart her eating plan.   Behavioral modification strategies: increasing lean protein intake.  Heather [name] has agreed to follow-up with our clinic in 3 weeks via virtual visit. She was informed of the  importance of frequent follow-up visits to maximize her success with intensive lifestyle modifications for her multiple health conditions.   Objective:   Blood pressure 120/73, pulse 61, temperature 97.9 F (36.6 C), height 5' 4"  (1.626 m), weight 215 lb (97.5 kg), SpO2 99 %. Body mass index is 36.9 kg/m.  General: Cooperative, alert, well developed, in no acute distress. HEENT: Conjunctivae and lids unremarkable. Cardiovascular: Regular rhythm.  Lungs: Normal work of breathing. Neurologic: No focal deficits.   Lab Results  Component Value Date   CREATININE 0.85 11/21/2021   BUN 16 11/21/2021   NA 140 11/21/2021   K 3.8 11/21/2021   CL 106 11/21/2021   CO2 28 11/21/2021   Lab Results  Component Value Date   ALT 19 11/21/2021   AST 26 11/21/2021   ALKPHOS 55 11/21/2021   BILITOT 0.3 11/21/2021   Lab Results  Component Value Date   HGBA1C 6.1 11/21/2021   HGBA1C 5.3 04/14/2021   HGBA1C 5.8 (H) 03/07/2021   HGBA1C 6.1 (H) 11/25/2020   HGBA1C 5.9 06/21/2020   Lab Results  Component Value Date   INSULIN 6.0 11/25/2020   INSULIN 4.3 10/09/2019   INSULIN 8.6 07/05/2019   INSULIN 6.4 12/05/2018   INSULIN 3.6 04/27/2018   Lab Results  Component Value Date   TSH 1.770 04/14/2021   Lab Results  Component Value Date   CHOL 141 11/21/2021   HDL 61.60 11/21/2021  LDLCALC 62 11/21/2021   TRIG 87.0 11/21/2021   CHOLHDL 2 11/21/2021   Lab Results  Component Value Date   VD25OH 48.25 11/21/2021   VD25OH 55.3 04/14/2021   VD25OH 66.2 11/25/2020   Lab Results  Component Value Date   WBC 3.5 (L) 11/21/2021   HGB 12.7 11/21/2021   HCT 38.3 11/21/2021   MCV 95.5 11/21/2021   PLT 152.0 11/21/2021   Lab Results  Component Value Date   IRON 42 04/14/2021   TIBC 231 (L) 04/14/2021   FERRITIN 338 (H) 04/14/2021   Attestation Statements:   Reviewed by clinician on day of visit: allergies, medications, problem list, medical history, surgical history, family  history, social history, and previous encounter notes.  Time spent on visit including pre-visit chart review and post-visit care and charting was 50 minutes.   I, [name], am acting as transcriptionist for [name], MD.  I have reviewed the above documentation for accuracy and completeness, and I agree with the above. -  [name], MD

## 2021-11-26 NOTE — Progress Notes (Signed)
She presents today chief complaint of painful elongated toenails.  She also has painful calluses.  Objective: Vital signs stable alert oriented x3.  Pulses are palpable.  There is no erythema  cellulitis drainage odor she has mild edema.  Toenails are long thick yellow dystrophic onychomycotic multiple small reactive benign lesions no open lesions or wounds.  Assessment: Pain limb secondary onychomycosis benign skin lesions.  Plan: Debride onychomycosis and benign skin lesions.

## 2021-11-27 DIAGNOSIS — M25511 Pain in right shoulder: Secondary | ICD-10-CM | POA: Diagnosis not present

## 2021-11-27 DIAGNOSIS — M79621 Pain in right upper arm: Secondary | ICD-10-CM | POA: Diagnosis not present

## 2021-11-27 DIAGNOSIS — R531 Weakness: Secondary | ICD-10-CM | POA: Diagnosis not present

## 2021-11-27 DIAGNOSIS — M25611 Stiffness of right shoulder, not elsewhere classified: Secondary | ICD-10-CM | POA: Diagnosis not present

## 2021-12-04 DIAGNOSIS — M25611 Stiffness of right shoulder, not elsewhere classified: Secondary | ICD-10-CM | POA: Diagnosis not present

## 2021-12-04 DIAGNOSIS — M79621 Pain in right upper arm: Secondary | ICD-10-CM | POA: Diagnosis not present

## 2021-12-04 DIAGNOSIS — M25511 Pain in right shoulder: Secondary | ICD-10-CM | POA: Diagnosis not present

## 2021-12-04 DIAGNOSIS — R531 Weakness: Secondary | ICD-10-CM | POA: Diagnosis not present

## 2021-12-08 ENCOUNTER — Telehealth: Payer: Self-pay | Admitting: Adult Health

## 2021-12-08 NOTE — Telephone Encounter (Signed)
LVM and sent mychart msg informing pt of r/s needed for 11/7 appt- Megan out.

## 2021-12-09 DIAGNOSIS — M79621 Pain in right upper arm: Secondary | ICD-10-CM | POA: Diagnosis not present

## 2021-12-09 DIAGNOSIS — M25611 Stiffness of right shoulder, not elsewhere classified: Secondary | ICD-10-CM | POA: Diagnosis not present

## 2021-12-09 DIAGNOSIS — R531 Weakness: Secondary | ICD-10-CM | POA: Diagnosis not present

## 2021-12-09 DIAGNOSIS — M25511 Pain in right shoulder: Secondary | ICD-10-CM | POA: Diagnosis not present

## 2021-12-10 ENCOUNTER — Telehealth (INDEPENDENT_AMBULATORY_CARE_PROVIDER_SITE_OTHER): Payer: Medicare Other | Admitting: Family Medicine

## 2021-12-10 ENCOUNTER — Encounter (INDEPENDENT_AMBULATORY_CARE_PROVIDER_SITE_OTHER): Payer: Self-pay | Admitting: Family Medicine

## 2021-12-10 DIAGNOSIS — E1169 Type 2 diabetes mellitus with other specified complication: Secondary | ICD-10-CM | POA: Insufficient documentation

## 2021-12-10 DIAGNOSIS — E559 Vitamin D deficiency, unspecified: Secondary | ICD-10-CM

## 2021-12-10 DIAGNOSIS — E669 Obesity, unspecified: Secondary | ICD-10-CM

## 2021-12-10 DIAGNOSIS — Z6836 Body mass index (BMI) 36.0-36.9, adult: Secondary | ICD-10-CM | POA: Diagnosis not present

## 2021-12-12 DIAGNOSIS — M79621 Pain in right upper arm: Secondary | ICD-10-CM | POA: Diagnosis not present

## 2021-12-12 DIAGNOSIS — M25611 Stiffness of right shoulder, not elsewhere classified: Secondary | ICD-10-CM | POA: Diagnosis not present

## 2021-12-12 DIAGNOSIS — M25511 Pain in right shoulder: Secondary | ICD-10-CM | POA: Diagnosis not present

## 2021-12-12 DIAGNOSIS — R531 Weakness: Secondary | ICD-10-CM | POA: Diagnosis not present

## 2021-12-16 ENCOUNTER — Encounter: Payer: Self-pay | Admitting: Neurology

## 2021-12-16 ENCOUNTER — Ambulatory Visit: Payer: Medicare Other | Admitting: Adult Health

## 2021-12-16 ENCOUNTER — Ambulatory Visit (INDEPENDENT_AMBULATORY_CARE_PROVIDER_SITE_OTHER): Payer: Medicare Other | Admitting: Neurology

## 2021-12-16 VITALS — BP 119/73 | HR 70 | Ht 64.0 in | Wt 222.0 lb

## 2021-12-16 DIAGNOSIS — R531 Weakness: Secondary | ICD-10-CM | POA: Diagnosis not present

## 2021-12-16 DIAGNOSIS — G4733 Obstructive sleep apnea (adult) (pediatric): Secondary | ICD-10-CM

## 2021-12-16 DIAGNOSIS — R413 Other amnesia: Secondary | ICD-10-CM

## 2021-12-16 DIAGNOSIS — G43009 Migraine without aura, not intractable, without status migrainosus: Secondary | ICD-10-CM | POA: Diagnosis not present

## 2021-12-16 DIAGNOSIS — M79621 Pain in right upper arm: Secondary | ICD-10-CM | POA: Diagnosis not present

## 2021-12-16 DIAGNOSIS — M25611 Stiffness of right shoulder, not elsewhere classified: Secondary | ICD-10-CM | POA: Diagnosis not present

## 2021-12-16 DIAGNOSIS — M25511 Pain in right shoulder: Secondary | ICD-10-CM | POA: Diagnosis not present

## 2021-12-16 DIAGNOSIS — I2510 Atherosclerotic heart disease of native coronary artery without angina pectoris: Secondary | ICD-10-CM

## 2021-12-16 MED ORDER — TOPIRAMATE 25 MG PO TABS: 75.0000 mg | ORAL_TABLET | Freq: Every day | ORAL | 3 refills | Status: DC

## 2021-12-16 NOTE — Progress Notes (Signed)
PATIENT: Heather [name] DOB: 1945-05-18  REASON FOR VISIT: follow up HISTORY FROM: patient Primary Neurologist: Willis/Dohmeier   HISTORY OF PRESENT ILLNESS: Today 12/16/21 Heather [name] is here today for follow-up.  MMSE 30/30. She focused for the test. Short term memory is not extraordinary anymore. Some trouble remembering names, but remembers other complicated details about them. Lives with husband. Does her own daily activities. Is very active, independent. She doesn't think memory issue, more of overload problem.   Headaches are essentially resolved. 2 headaches in 1 year. Still on Topamax 75 mg, tried to reduce to 25 mg had more headaches. No change in memory with lower dose.   Review of CPAP data indicates overall excellent compliance at 97% 29/30 days.  Average usage days used 6 hours 41 minutes. IPAP 12, EPAP 8.  Leak in the 95th percentile was 44.5, AHI 0.4. sleeps like a rock. Sometimes mask leaks with whistle, will tighten. 1 night she was out of town. Uses full face mask. ESS 16, but claims score may be skewed because she relaxes very well.   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 Dealing with right rotator cuff issues.   Update 08/12/20 SS: Heather [name] is a 76 year old female with history of memory issues.  Is on CPAP.  Has been on Topamax for migraines, was able to reduce the Topamax from 75 mg to 25 mg daily.  MRI of the brain is unremarkable. MMSE 30/30 today. No change in memory since Topamax dose reduction. No longer reports severe migraines, only mild headaches, doing well with this. Sometimes will have episodes of wooziness, but if she takes an sip of something to drink resolves. These are not intense. Most issue is word finding, is picky with finding the right word. Hard to identify actors on TV. She drives a car, is still highly functional. Bothers her can't do what she once did, remembering miscellaneous useless facts. In PT, walked  in without cane today. Having cataract surgery soon. Has had recent Lumbar ESI.  HISTORY  02/08/2020 Dr. Jannifer Franklin: [name] is a 76 year old right-handed black female with a history of sleep apnea and diabetes.  The patient is on CPAP.  She reports a 5 to 6-year history of some memory issues that have gradually changed over time.  She mainly reports difficulty with word finding, and difficulty remembering names for people.  She has a history of migraine headaches, she has been on Topamax for a number of years but seems to tolerate this fairly well.  She currently is on 100 mg daily of Topamax.  She reports no problems with driving.  She is able to keep up with her medications and appointments fairly well, but occasionally she will get mixed up on dosing of medications.  The patient reports that she sleeps well at night and has a good energy level during the day.  She is doing quite well with her migraines, she only has an occasional headache.  She reports that her mother also had dementia but she had bipolar disorder as well.  The patient is sent to this office for further evaluation.  REVIEW OF SYSTEMS: Out of a complete 14 system review of symptoms, the patient complains only of the following symptoms, and all other reviewed systems are negative.  See HPI  ALLERGIES: Allergies  Allergen Reactions   Aspirin Hives   Coconut (Cocos Nucifera) Hives   Lisinopril Cough   Metoprolol Itching   Other  Latex paint, the smell causes hives. Not allergic to latex gloves   General anesthesia - has had issues in past where anesthesia was not properly administered and caused side effects   Peanut-Containing Drug Products Hives and Itching   Prednisone     Unknown reaction    Strawberry Extract Hives   Penicillins Rash    HOME MEDICATIONS: Outpatient Medications Prior to Visit  Medication Sig Dispense Refill   acetaminophen (TYLENOL) 500 MG tablet Take 1,000 mg by mouth 3 (three) times daily as  needed for moderate pain.     Boswellia-Glucosamine-Vit D (OSTEO BI-FLEX ONE PER DAY PO) Take 1 tablet by mouth daily.     Calcium Carb-Cholecalciferol (CHEWABLE CALCIUM/D3 PO) Take 1 tablet by mouth daily.     cetirizine (ZYRTEC) 10 MG tablet Take 10 mg by mouth daily.     clopidogrel (PLAVIX) 75 MG tablet TAKE 1 TABLET DAILY 90 tablet 3   Coenzyme Q10 (CO Q 10 PO) Take 400 mg by mouth.     Cyanocobalamin 1000 MCG CAPS Take 1 capsule by mouth daily.     ezetimibe (ZETIA) 10 MG tablet Take 1 tablet (10 mg total) by mouth daily. 90 tablet 3   Ferrous Sulfate (IRON) 325 (65 FE) MG TABS Take 1 tablet by mouth daily.     furosemide (LASIX) 40 MG tablet Take 1 tablet (40 mg total) by mouth daily as needed. 90 tablet 3   gabapentin (NEURONTIN) 100 MG capsule TAKE 3 CAPSULES BY MOUTH AT BEDTIME 270 capsule 3   KLOR-CON M20 20 MEQ tablet TAKE 1 TABLET DAILY 90 tablet 3   Multiple Vitamin (MULTIVITAMIN) tablet Take 1 tablet by mouth daily.     naproxen sodium (ALEVE) 220 MG tablet Take 220 mg by mouth daily as needed (pain).     Omega-3 Fatty Acids (FISH OIL PO) Take 2 g by mouth daily.     pantoprazole (PROTONIX) 20 MG tablet Take 1 tablet (20 mg total) by mouth 2 (two) times daily. 30 min before food 180 tablet 3   polyethylene glycol (MIRALAX / GLYCOLAX) 17 g packet Take 17 g by mouth daily as needed for moderate constipation.     pyridoxine (B-6) 200 MG tablet Take 200 mg by mouth daily.     rosuvastatin (CRESTOR) 40 MG tablet Take 1 tablet (40 mg total) by mouth daily. At night 90 tablet 3   topiramate (TOPAMAX) 25 MG tablet Take 1 tablet (25 mg total) by mouth 3 (three) times daily. 270 tablet 1   traMADol (ULTRAM) 50 MG tablet Take 1-2 tablets (50-100 mg total) by mouth every 12 (twelve) hours as needed. 60 tablet 2   trolamine salicylate (ASPERCREME) 10 % cream Apply 1 application topically as needed for muscle pain.     trospium (SANCTURA) 20 MG tablet Take 1 tablet (20 mg total) by mouth 2  (two) times daily. Prn overactive bladder (Patient taking differently: Take 20 mg by mouth 2 (two) times daily.) 60 tablet 11   TURMERIC PO Take 538 mg by mouth daily.     Vitamin D, Ergocalciferol, (DRISDOL) 1.25 MG (50000 UNIT) CAPS capsule Take 1 capsule (50,000 Units total) by mouth every 7 (seven) days. 13 capsule 0   vitamin E 200 UNIT capsule Take 200 Units by mouth daily.     No facility-administered medications prior to visit.    PAST MEDICAL HISTORY: Past Medical History:  Diagnosis Date   Abdominal hernia    Abnormal Pap smear  ALLERGIC RHINITIS 10/22/2006   Allergy    ANEMIA 12/18/2008   Arthritis    scoliosis moderate deg changes lumbar Xray 11/04/17    Asthma    ASYMPTOMATIC POSTMENOPAUSAL STATUS 11/22/2007   Back pain    CAD (coronary artery disease)    in LAD   CHF (congestive heart failure) ([place])    Complication of anesthesia 2010   pt states she woke up during colonoscopy and was aware of "losing control of breathing" during mammoplasty   Degenerative arthritis    Diverticulosis    Dyslipidemia    Fibroid    Frequent headaches    GERD 07/20/2007   GOITER, MULTINODULAR 07/20/2007   Headache(784.0) 07/20/2007   HEARING LOSS 11/22/2007   Hiatal hernia    Hiatal hernia    large   History of chicken pox    Hx of colposcopy with cervical biopsy    HYPERCHOLESTEROLEMIA 01/13/2007   Hyperglycemia    HYPERTENSION 10/22/2006   Lung nodule    unchanged since 05/26/13    Migraines    NASH (nonalcoholic steatohepatitis)    Nocturnal hypoxemia 02/17/2013   Obesity    Obstructive sleep apnea    OSA on CPAP    per neurology   OSTEOARTHRITIS 10/22/2006   Other chronic nonalcoholic liver disease 62/95/2841   Pre-diabetes    Sedimentation rate elevation    Sleep apnea    on cpap   Urine incontinence    UTI (urinary tract infection)     PAST SURGICAL HISTORY: Past Surgical History:  Procedure Laterality Date   BREAST SURGERY  1984   Breast reduction b/l     CATARACT EXTRACTION, BILATERAL     DEXA  08/2005   DILATION AND CURETTAGE OF UTERUS     ELECTROCARDIOGRAM  10/15/2006   ESOPHAGOGASTRODUODENOSCOPY  12/08/2005   FOOT SURGERY     hammertoe and bunion 05/2017    Stress Cardiolite  10/21/2005   sweat gland removal     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   Procedure: Left TOTAL KNEE ARTHROPLASTY;  Surgeon: Mcarthur Rossetti, MD;  Location: [facility];  Service: Orthopedics;  Laterality: Left;   WISDOM TOOTH EXTRACTION      FAMILY HISTORY: Family History  Problem Relation Age of Onset   Asthma Mother    Depression Mother    Bipolar disorder Mother    Dementia Mother    Arthritis Mother    Breast cancer Mother        primary   Colon cancer Mother        mets from breast   Cancer Mother        colon   Hypertension Father    Migraines Father    Cancer Maternal Aunt        ?   Heart disease Maternal Grandmother    Cancer Maternal Grandfather        stomach ?   Sleep apnea Neg Hx    Esophageal cancer Neg Hx    Stomach cancer Neg Hx    Rectal cancer Neg Hx     SOCIAL HISTORY: Social History   Socioeconomic History   Marital status: Married    Spouse name: [name]   Number of children: 2   Years of education: College   Highest education level: Not on file  Occupational History   Occupation: Retired  Tobacco Use   Smoking status: Former    Packs/day: 2.00    Years: 20.00    Total pack years: 40.00  Types: Cigarettes    Quit date: 03/03/1979    Years since quitting: 42.8   Smokeless tobacco: Never  Vaping Use   Vaping Use: Never used  Substance and Sexual Activity   Alcohol use: Yes    Comment: rare - TWICE A YR   Drug use: No   Sexual activity: Yes    Birth control/protection: Surgical  Other Topics Concern   Not on file  Social History Narrative   Patient is married [name]).   Right Handed   Drinks 1 cup caffeine   Social Determinants of Health   Financial Resource Strain: Low Risk  (11/27/2020)    Overall Financial Resource Strain (CARDIA)    Difficulty of Paying Living Expenses: Not hard at all  Food Insecurity: No Food Insecurity (10/24/2021)   Hunger Vital Sign    Worried About Running Out of Food in the Last Year: Never true    Ran Out of Food in the Last Year: Never true  Transportation Needs: No Transportation Needs (10/24/2021)   PRAPARE - Hydrologist (Medical): No    Lack of Transportation (Non-Medical): No  Physical Activity: Sufficiently Active (11/24/2018)   Exercise Vital Sign    Days of Exercise per Week: 4 days    Minutes of Exercise per Session: 60 min  Stress: No Stress Concern Present (11/27/2020)   [place]    Feeling of Stress : Not at all  Social Connections: Unknown (11/27/2020)   Social Connection and Isolation Panel [NHANES]    Frequency of Communication with Friends and Family: Not on file    Frequency of Social Gatherings with Friends and Family: Not on file    Attends Religious Services: Not on file    Active Member of Clubs or Organizations: Not on file    Attends Archivist Meetings: Not on file    Marital Status: Married  Intimate Partner Violence: Not At Risk (11/27/2020)   Humiliation, Afraid, Rape, and Kick questionnaire    Fear of Current or Ex-Partner: No    Emotionally Abused: No    Physically Abused: No    Sexually Abused: No   PHYSICAL EXAM  Vitals:   12/16/21 1336  BP: 119/73  Pulse: 70  Weight: 222 lb (100.7 kg)  Height: 5' 4"  (1.626 m)   Body mass index is 38.11 kg/m.  Generalized: Well developed, in no acute distress     12/16/2021    1:46 PM 01/14/2021   11:37 AM 08/12/2020   10:21 AM  MMSE - Mini Mental State Exam  Orientation to time 5 4 5   Orientation to Place 5 5 5   Registration 3 3 3   Attention/ Calculation 5 4 5   Recall 3 3 3   Language- name 2 objects 2 2 2   Language- repeat 1 1 1   Language- follow 3  step command 3 3 3   Language- read & follow direction 1 1 1   Write a sentence 1 1 1   Copy design 1 1 1   Total score 30 28 30    Neurological examination  Mentation: Alert oriented to time, place, history taking. Follows all commands speech and language fluent.  No word finding trouble noted. Cranial nerve II-XII: Pupils were equal round reactive to light. Extraocular movements were full, visual field were full on confrontational test. Facial sensation and strength were normal. Head turning and shoulder shrug  were normal and symmetric. Motor:  4/5 right upper extremity due to  rotator cuff, 4 out of 5 bilateral hip flexion Sensory: Sensory testing is intact to soft touch on all 4 extremities. No evidence of extinction is noted.  Coordination: Cerebellar testing reveals good finger-nose-finger and heel-to-shin bilaterally.  Gait and station: Gait is wide-based, cautious but independent Reflexes: Deep tendon reflexes are symmetric but depressed throughout  DIAGNOSTIC DATA (LABS, IMAGING, TESTING) - I reviewed patient records, labs, notes, testing and imaging myself where available.  Lab Results  Component Value Date   WBC 3.5 (L) 11/21/2021   HGB 12.7 11/21/2021   HCT 38.3 11/21/2021   MCV 95.5 11/21/2021   PLT 152.0 11/21/2021      Component Value Date/Time   NA 140 11/21/2021 1242   NA 143 04/14/2021 1140   K 3.8 11/21/2021 1242   CL 106 11/21/2021 1242   CO2 28 11/21/2021 1242   GLUCOSE 83 11/21/2021 1242   BUN 16 11/21/2021 1242   BUN 14 04/14/2021 1140   CREATININE 0.85 11/21/2021 1242   CREATININE 0.80 12/07/2019 1246   CREATININE 0.81 12/28/2011 0853   CALCIUM 9.3 11/21/2021 1242   PROT 7.5 11/21/2021 1242   PROT 7.3 04/14/2021 1140   ALBUMIN 3.8 11/21/2021 1242   ALBUMIN 3.9 04/14/2021 1140   AST 26 11/21/2021 1242   AST 19 12/07/2019 1246   ALT 19 11/21/2021 1242   ALT 20 12/07/2019 1246   ALKPHOS 55 11/21/2021 1242   BILITOT 0.3 11/21/2021 1242   BILITOT 0.2  04/14/2021 1140   BILITOT 0.3 12/07/2019 1246   GFRNONAA >60 03/12/2021 0242   GFRNONAA >60 12/07/2019 1246   GFRAA 100 10/09/2019 1425   Lab Results  Component Value Date   CHOL 141 11/21/2021   HDL 61.60 11/21/2021   LDLCALC 62 11/21/2021   TRIG 87.0 11/21/2021   CHOLHDL 2 11/21/2021   Lab Results  Component Value Date   HGBA1C 6.1 11/21/2021   Lab Results  Component Value Date   VITAMINB12 3,179 (H) 12/07/2019   Lab Results  Component Value Date   TSH 1.770 04/14/2021   ASSESSMENT AND PLAN 76 y.o. year old female  has a past medical history of Abdominal hernia, Abnormal Pap smear, ALLERGIC RHINITIS (10/22/2006), Allergy, ANEMIA (12/18/2008), Arthritis, Asthma, ASYMPTOMATIC POSTMENOPAUSAL STATUS (11/22/2007), Back pain, CAD (coronary artery disease), CHF (congestive heart failure) ([place]), Complication of anesthesia (2010), Degenerative arthritis, Diverticulosis, Dyslipidemia, Fibroid, Frequent headaches, GERD (07/20/2007), GOITER, MULTINODULAR (07/20/2007), Headache(784.0) (07/20/2007), HEARING LOSS (11/22/2007), Hiatal hernia, Hiatal hernia, History of chicken pox, colposcopy with cervical biopsy, HYPERCHOLESTEROLEMIA (01/13/2007), Hyperglycemia, HYPERTENSION (10/22/2006), Lung nodule, Migraines, NASH (nonalcoholic steatohepatitis), Nocturnal hypoxemia (02/17/2013), Obesity, Obstructive sleep apnea, OSA on CPAP, OSTEOARTHRITIS (10/22/2006), Other chronic nonalcoholic liver disease (18/86/7737), Pre-diabetes, Sedimentation rate elevation, Sleep apnea, Urine incontinence, and UTI (urinary tract infection). here with:  Reported memory disturbance, mild cognitive impairment -Stable, MMSE 30/30, will continue to monitor  Migraine headache -Under excellent control, continue Topamax 75 mg nightly, when tried to wean had increase in headache  OSA on CPAP -Overall excellent compliance, there is a mask leak -I will order mask refit, when she gets a new mask, she can contact me and I will  order new download -Follow-up in 1 year or sooner if needed  Butler Denmark, Laqueta Jean, DNP 12/16/2021, 1:48 PM [facility] [address], [place], [place] [address] [phone]

## 2021-12-16 NOTE — Patient Instructions (Signed)
Continue CPAP use, order mask refit, once you  get the mask refitted, let me know I can pull a new download to review Continue Topamax  See you back in 1 year

## 2021-12-17 NOTE — Progress Notes (Signed)
TeleHealth Visit:  Due to the COVID-19 pandemic, this visit was completed with telemedicine (audio/video) technology to reduce patient and provider exposure as well as to preserve personal protective equipment.   [name] has verbally consented to this TeleHealth visit. The patient is located at home, the provider is located at the [facility] office. The participants in this visit include the listed provider and patient. The visit was conducted today via MyChart video.   Chief Complaint: OBESITY Heather [name] is here to discuss her progress with her obesity treatment plan along with follow-up of her obesity related diagnoses. Heather [name] is on the Category 2 Plan and states she is following her eating plan approximately 0% of the time. Heather [name] states she is doing 0 minutes 0 times per week.  Today's visit was #: 40 Starting weight: 268 lbs Starting date: 12/23/2016  Interim History: Heather [name]'s husband is doing much better.  She is trying to get back on track, but she is still struggling with meal prep and cooking.  She is still feeling exhausted at times.  She feels like she is doing better with getting a more protein.  She is recovering from a right arm injury and she is doing physical therapy, but she is not exercising much yet.  Subjective:   1. Vitamin D deficiency [name] is taking vitamin D 50,000 units weekly with no side effects noted.  Her last vitamin D level was 48.25 on 11/21/2021.  2. Type 2 diabetes mellitus with other specified complication, unspecified whether long term insulin use (HCC) Heather [name]'s last A1c was 5.3.  She continues to try to get back on track with her healthy eating.  Assessment/Plan:   1. Vitamin D deficiency [name] will continue prescription Vitamin D 50,000 IU every week and will follow-up for routine testing of Vitamin D, at least 2-3 times per year to avoid over-replacement.  2. Type 2 diabetes mellitus with other specified complication, unspecified whether  long term insulin use ([name]) Heather [name] will continue with her diet and exercise as she is able to.  3. Obesity,current BMI 36.90 Heather [name] is currently in the action stage of change. As such, her goal is to continue with weight loss efforts. She has agreed to the Category 2 Plan.   Encouragement was provided to get back on track.  Behavioral modification strategies: increasing lean protein intake, decreasing simple carbohydrates, no skipping meals, meal planning and cooking strategies, and planning for success.  Heather [name] has agreed to follow-up with our clinic in 3 to 4 weeks. She was informed of the importance of frequent follow-up visits to maximize her success with intensive lifestyle modifications for her multiple health conditions.  Objective:   VITALS: Per patient if applicable, see vitals. GENERAL: Alert and in no acute distress. CARDIOPULMONARY: No increased WOB. Speaking in clear sentences.  PSYCH: Pleasant and cooperative. Speech normal rate and rhythm. Affect is appropriate. Insight and judgement are appropriate. Attention is focused, linear, and appropriate.  NEURO: Oriented as arrived to appointment on time with no prompting.   Lab Results  Component Value Date   CREATININE 0.85 11/21/2021   BUN 16 11/21/2021   NA 140 11/21/2021   K 3.8 11/21/2021   CL 106 11/21/2021   CO2 28 11/21/2021   Lab Results  Component Value Date   ALT 19 11/21/2021   AST 26 11/21/2021   ALKPHOS 55 11/21/2021   BILITOT 0.3 11/21/2021   Lab Results  Component Value Date   HGBA1C 6.1 11/21/2021   HGBA1C 5.3  04/14/2021   HGBA1C 5.8 (H) 03/07/2021   HGBA1C 6.1 (H) 11/25/2020   HGBA1C 5.9 06/21/2020   Lab Results  Component Value Date   INSULIN 6.0 11/25/2020   INSULIN 4.3 10/09/2019   INSULIN 8.6 07/05/2019   INSULIN 6.4 12/05/2018   INSULIN 3.6 04/27/2018   Lab Results  Component Value Date   TSH 1.770 04/14/2021   Lab Results  Component Value Date   CHOL 141 11/21/2021   HDL 61.60  11/21/2021   LDLCALC 62 11/21/2021   TRIG 87.0 11/21/2021   CHOLHDL 2 11/21/2021   Lab Results  Component Value Date   VD25OH 48.25 11/21/2021   VD25OH 55.3 04/14/2021   VD25OH 66.2 11/25/2020   Lab Results  Component Value Date   WBC 3.5 (L) 11/21/2021   HGB 12.7 11/21/2021   HCT 38.3 11/21/2021   MCV 95.5 11/21/2021   PLT 152.0 11/21/2021   Lab Results  Component Value Date   IRON 42 04/14/2021   TIBC 231 (L) 04/14/2021   FERRITIN 338 (H) 04/14/2021    Attestation Statements:   Reviewed by clinician on day of visit: allergies, medications, problem list, medical history, surgical history, family history, social history, and previous encounter notes.   I, [name], am acting as transcriptionist for [name], MD.  I have reviewed the above documentation for accuracy and completeness, and I agree with the above. - [name], MD

## 2021-12-23 ENCOUNTER — Telehealth: Payer: Self-pay

## 2021-12-23 DIAGNOSIS — M79621 Pain in right upper arm: Secondary | ICD-10-CM | POA: Diagnosis not present

## 2021-12-23 DIAGNOSIS — M25511 Pain in right shoulder: Secondary | ICD-10-CM | POA: Diagnosis not present

## 2021-12-23 DIAGNOSIS — M25611 Stiffness of right shoulder, not elsewhere classified: Secondary | ICD-10-CM | POA: Diagnosis not present

## 2021-12-23 DIAGNOSIS — R531 Weakness: Secondary | ICD-10-CM | POA: Diagnosis not present

## 2021-12-23 NOTE — Telephone Encounter (Signed)
CPAP supply order has been sent via community message to [facility].

## 2021-12-24 ENCOUNTER — Ambulatory Visit: Payer: Medicare Other | Admitting: Internal Medicine

## 2022-01-01 ENCOUNTER — Other Ambulatory Visit (HOSPITAL_COMMUNITY): Payer: Self-pay | Admitting: Cardiology

## 2022-01-05 ENCOUNTER — Encounter (INDEPENDENT_AMBULATORY_CARE_PROVIDER_SITE_OTHER): Payer: Self-pay | Admitting: Family Medicine

## 2022-01-05 ENCOUNTER — Ambulatory Visit (INDEPENDENT_AMBULATORY_CARE_PROVIDER_SITE_OTHER): Payer: Medicare Other | Admitting: Family Medicine

## 2022-01-05 VITALS — BP 126/73 | HR 64 | Temp 98.1°F | Ht 64.0 in | Wt 214.0 lb

## 2022-01-05 DIAGNOSIS — E559 Vitamin D deficiency, unspecified: Secondary | ICD-10-CM

## 2022-01-05 DIAGNOSIS — Z6836 Body mass index (BMI) 36.0-36.9, adult: Secondary | ICD-10-CM

## 2022-01-05 DIAGNOSIS — F439 Reaction to severe stress, unspecified: Secondary | ICD-10-CM

## 2022-01-05 DIAGNOSIS — E669 Obesity, unspecified: Secondary | ICD-10-CM | POA: Diagnosis not present

## 2022-01-05 MED ORDER — VITAMIN D (ERGOCALCIFEROL) 1.25 MG (50000 UNIT) PO CAPS: 50000.0000 [IU] | ORAL_CAPSULE | ORAL | 0 refills | Status: DC

## 2022-01-06 ENCOUNTER — Ambulatory Visit: Payer: Medicare Other | Admitting: Adult Health

## 2022-01-08 NOTE — Progress Notes (Signed)
[facility] [place] Phone: [phone] Subjective:    I'm seeing this patient by the request  of:  [name], MD  CC: Muscle cramping, improvement in shoulder  [id number]  11/24/2021 Patient does have a rotator cuff tear noted.  Patient does have some underlying arthritis that could be contributing as well.  Discussed with patient about icing regimen and home exercises, which activities to do and which ones to avoid.  Follow-up again in 6 to 8 weeks.  Worsening pain after formal physical therapy will consider injection or advanced imaging   Update 01/14/2022 Heather [name] is a 76 y.o. female coming in with complaint of R shoulder pain. Patient states that she graduated PT yesterday or the day before and she is very impressed with her progress. She feels like she is at 100% patient does state that after she had COVID she noticed that her legs have "went down the toilet" patient has to/needs to wear compression stocking and sometime an knee brace. Patient has been having a lot of leg discomfort. Legs and hands are getting lots of tingling no numbness.        Past Medical History:  Diagnosis Date   Abdominal hernia    Abnormal Pap smear    ALLERGIC RHINITIS 10/22/2006   Allergy    ANEMIA 12/18/2008   Arthritis    scoliosis moderate deg changes lumbar Xray 11/04/17    Asthma    ASYMPTOMATIC POSTMENOPAUSAL STATUS 11/22/2007   Back pain    CAD (coronary artery disease)    in LAD   CHF (congestive heart failure) ([place])    Complication of anesthesia 2010   pt states she woke up during colonoscopy and was aware of "losing control of breathing" during mammoplasty   Degenerative arthritis    Diverticulosis    Dyslipidemia    Fibroid    Frequent headaches    GERD 07/20/2007   GOITER, MULTINODULAR 07/20/2007   Headache(784.0) 07/20/2007   HEARING LOSS 11/22/2007   Hiatal hernia    Hiatal hernia    large    History of chicken pox    Hx of colposcopy with cervical biopsy    HYPERCHOLESTEROLEMIA 01/13/2007   Hyperglycemia    HYPERTENSION 10/22/2006   Lung nodule    unchanged since 05/26/13    Migraines    NASH (nonalcoholic steatohepatitis)    Nocturnal hypoxemia 02/17/2013   Obesity    Obstructive sleep apnea    OSA on CPAP    per neurology   OSTEOARTHRITIS 10/22/2006   Other chronic nonalcoholic liver disease 53/66/4403   Pre-diabetes    Sedimentation rate elevation    Sleep apnea    on cpap   Urine incontinence    UTI (urinary tract infection)    Past Surgical History:  Procedure Laterality Date   BREAST SURGERY  1984   Breast reduction b/l    CATARACT EXTRACTION, BILATERAL     DEXA  08/2005   DILATION AND CURETTAGE OF UTERUS     ELECTROCARDIOGRAM  10/15/2006   ESOPHAGOGASTRODUODENOSCOPY  12/08/2005   FOOT SURGERY     hammertoe and bunion 05/2017    Stress Cardiolite  10/21/2005   sweat gland removal     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   Procedure: Left TOTAL KNEE ARTHROPLASTY;  Surgeon: Mcarthur Rossetti, MD;  Location: [place];  Service: Orthopedics;  Laterality: Left;   WISDOM TOOTH EXTRACTION     Social History  Socioeconomic History   Marital status: Married    Spouse name: [name]   Number of children: 2   Years of education: College   Highest education level: Not on file  Occupational History   Occupation: Retired  Tobacco Use   Smoking status: Former    Packs/day: 2.00    Years: 20.00    Total pack years: 40.00    Types: Cigarettes    Quit date: 03/03/1979    Years since quitting: 42.8   Smokeless tobacco: Never  Vaping Use   Vaping Use: Never used  Substance and Sexual Activity   Alcohol use: Yes    Comment: rare - TWICE A YR   Drug use: No   Sexual activity: Yes    Birth control/protection: Surgical  Other Topics Concern   Not on file  Social History Narrative   Patient is married [name]).   Right Handed   Drinks 1 cup caffeine    Social Determinants of Health   Financial Resource Strain: Low Risk  (11/27/2020)   Overall Financial Resource Strain (CARDIA)    Difficulty of Paying Living Expenses: Not hard at all  Food Insecurity: No Food Insecurity (10/24/2021)   Hunger Vital Sign    Worried About Running Out of Food in the Last Year: Never true    Ran Out of Food in the Last Year: Never true  Transportation Needs: No Transportation Needs (10/24/2021)   PRAPARE - Hydrologist (Medical): No    Lack of Transportation (Non-Medical): No  Physical Activity: Sufficiently Active (11/24/2018)   Exercise Vital Sign    Days of Exercise per Week: 4 days    Minutes of Exercise per Session: 60 min  Stress: No Stress Concern Present (11/27/2020)   [place]    Feeling of Stress : Not at all  Social Connections: Unknown (11/27/2020)   Social Connection and Isolation Panel [NHANES]    Frequency of Communication with Friends and Family: Not on file    Frequency of Social Gatherings with Friends and Family: Not on file    Attends Religious Services: Not on file    Active Member of Clubs or Organizations: Not on file    Attends Archivist Meetings: Not on file    Marital Status: Married   Allergies  Allergen Reactions   Aspirin Hives   Coconut (Cocos Nucifera) Hives   Lisinopril Cough   Metoprolol Itching   Other     Latex paint, the smell causes hives. Not allergic to latex gloves   General anesthesia - has had issues in past where anesthesia was not properly administered and caused side effects   Peanut-Containing Drug Products Hives and Itching   Prednisone     Unknown reaction    Strawberry Extract Hives   Penicillins Rash   Family History  Problem Relation Age of Onset   Asthma Mother    Depression Mother    Bipolar disorder Mother    Dementia Mother    Arthritis Mother    Breast cancer Mother         primary   Colon cancer Mother        mets from breast   Cancer Mother        colon   Hypertension Father    Migraines Father    Cancer Maternal Aunt        ?   Heart disease Maternal Grandmother    Cancer  Maternal Grandfather        stomach ?   Sleep apnea Neg Hx    Esophageal cancer Neg Hx    Stomach cancer Neg Hx    Rectal cancer Neg Hx      Current Outpatient Medications (Cardiovascular):    ezetimibe (ZETIA) 10 MG tablet, Take 1 tablet (10 mg total) by mouth daily.   furosemide (LASIX) 40 MG tablet, Take 1 tablet (40 mg total) by mouth daily as needed.   rosuvastatin (CRESTOR) 40 MG tablet, Take 1 tablet (40 mg total) by mouth daily. At night  Current Outpatient Medications (Respiratory):    cetirizine (ZYRTEC) 10 MG tablet, Take 10 mg by mouth daily.  Current Outpatient Medications (Analgesics):    acetaminophen (TYLENOL) 500 MG tablet, Take 1,000 mg by mouth 3 (three) times daily as needed for moderate pain.   naproxen sodium (ALEVE) 220 MG tablet, Take 220 mg by mouth daily as needed (pain).   traMADol (ULTRAM) 50 MG tablet, Take 1-2 tablets (50-100 mg total) by mouth every 12 (twelve) hours as needed.  Current Outpatient Medications (Hematological):    clopidogrel (PLAVIX) 75 MG tablet, TAKE 1 TABLET DAILY   Cyanocobalamin 1000 MCG CAPS, Take 1 capsule by mouth daily.   Ferrous Sulfate (IRON) 325 (65 FE) MG TABS, Take 1 tablet by mouth daily.  Current Outpatient Medications (Other):    Boswellia-Glucosamine-Vit D (OSTEO BI-FLEX ONE PER DAY PO), Take 1 tablet by mouth daily.   Calcium Carb-Cholecalciferol (CHEWABLE CALCIUM/D3 PO), Take 1 tablet by mouth daily.   Coenzyme Q10 (CO Q 10 PO), Take 400 mg by mouth.   gabapentin (NEURONTIN) 100 MG capsule, TAKE 3 CAPSULES BY MOUTH AT BEDTIME   KLOR-CON M20 20 MEQ tablet, TAKE 1 TABLET DAILY   Multiple Vitamin (MULTIVITAMIN) tablet, Take 1 tablet by mouth daily.   Omega-3 Fatty Acids (FISH OIL PO), Take 2 g by mouth  daily.   pantoprazole (PROTONIX) 20 MG tablet, Take 1 tablet (20 mg total) by mouth 2 (two) times daily. 30 min before food   polyethylene glycol (MIRALAX / GLYCOLAX) 17 g packet, Take 17 g by mouth daily as needed for moderate constipation.   pyridoxine (B-6) 200 MG tablet, Take 200 mg by mouth daily.   topiramate (TOPAMAX) 25 MG tablet, Take 3 tablets (75 mg total) by mouth at bedtime.   trolamine salicylate (ASPERCREME) 10 % cream, Apply 1 application topically as needed for muscle pain.   trospium (SANCTURA) 20 MG tablet, Take 1 tablet (20 mg total) by mouth 2 (two) times daily. Prn overactive bladder (Patient taking differently: Take 20 mg by mouth 2 (two) times daily.)   TURMERIC PO, Take 538 mg by mouth daily.   Vitamin D, Ergocalciferol, (DRISDOL) 1.25 MG (50000 UNIT) CAPS capsule, Take 1 capsule (50,000 Units total) by mouth every 7 (seven) days.   vitamin E 200 UNIT capsule, Take 200 Units by mouth daily.   Reviewed prior external information including notes and imaging from  primary care provider As well as notes that were available from care everywhere and other healthcare systems.  Past medical history, social, surgical and family history all reviewed in electronic medical record.  No pertanent information unless stated regarding to the chief complaint.   Review of Systems:  No headache, visual changes, nausea, vomiting, diarrhea, constipation, dizziness, abdominal pain, skin rash, fevers, chills, night sweats, weight loss, swollen lymph nodes, , joint swelling, chest pain, shortness of breath, mood changes. POSITIVE muscle aches, body aches  Objective  Blood pressure 110/62, pulse 72, height 5' 4"  (1.626 m), weight 220 lb (99.8 kg), SpO2 96 %.   General: No apparent distress alert and oriented x3 mood and affect normal, dressed appropriately.  Overweight HEENT: Pupils equal, extraocular movements intact  Respiratory: Patient's speak in full sentences and does not appear short  of breath  Patient's right shoulder does have significant improvement in range of motion and actively does have good strength.  Rotator cuff strength 4 out of 5 but this is improved from previous exam.  Negative crossover test.  Very mild positive impingement noted.  Lower extremities do show the patient does still have pitting edema noted patient does have some discoloration of the second toenail that seems to be more secondary to pressure.  Dorsalis pedis and posterior tibialis pulses are brisk.    Impression and Recommendations:

## 2022-01-12 DIAGNOSIS — M25611 Stiffness of right shoulder, not elsewhere classified: Secondary | ICD-10-CM | POA: Diagnosis not present

## 2022-01-12 DIAGNOSIS — R531 Weakness: Secondary | ICD-10-CM | POA: Diagnosis not present

## 2022-01-12 DIAGNOSIS — M79621 Pain in right upper arm: Secondary | ICD-10-CM | POA: Diagnosis not present

## 2022-01-12 DIAGNOSIS — M25511 Pain in right shoulder: Secondary | ICD-10-CM | POA: Diagnosis not present

## 2022-01-13 NOTE — Progress Notes (Signed)
Chief Complaint:   OBESITY Heather [name] is here to discuss her progress with her obesity treatment plan along with follow-up of her obesity related diagnoses. Heather [name] is on the Category 2 Plan and states she is following her eating plan approximately 10% of the time. Heather [name] states she is walking some.   Today's visit was #: 59 Starting weight: 268 lbs Starting date: 12/23/2016 Today's weight: 214 lbs Today's date: 01/05/2022 Total lbs lost to date: 84 Total lbs lost since last in-office visit: 1  Interim History: Heather [name] has done well with weight loss. She went camping over a long weekend, and she was very active with walking over 10 miles that weekend.   Subjective:   1. Stress Heather [name]'s husband has been sick and she has been very busy caring for him, and she is taking him to multiple doctor visits.  She is working on feeding him healthy foods.  She is feeling a bit overwhelmed.  2. Vitamin D deficiency Heather [name] is stable on vitamin D, with no side effects noted.  Assessment/Plan:   1. Stress Heather [name] was encouraged to take time to care for herself.  2. Vitamin D deficiency We will refill prescription Vitamin D 90 days. Heather [name] will follow-up for routine testing of Vitamin D, at least 2-3 times per year to avoid over-replacement.  - Vitamin D, Ergocalciferol, (DRISDOL) 1.25 MG (50000 UNIT) CAPS capsule; Take 1 capsule (50,000 Units total) by mouth every 7 (seven) days.  Dispense: 13 capsule; Refill: 0  3. Obesity with current BMI of 36.8 Heather [name] is currently in the action stage of change. As such, her goal is to continue with weight loss efforts. She has agreed to the Category 2 Plan.   Exercise goals: As is.   Behavioral modification strategies: increasing lean protein intake.  Heather [name] has agreed to follow-up with our clinic in 4 weeks. She was informed of the importance of frequent follow-up visits to maximize her success with intensive lifestyle modifications for her multiple health  conditions.   Objective:   Blood pressure 126/73, pulse 64, temperature 98.1 F (36.7 C), height 5' 4"  (1.626 m), weight 214 lb (97.1 kg), SpO2 98 %. Body mass index is 36.73 kg/m.  General: Cooperative, alert, well developed, in no acute distress. HEENT: Conjunctivae and lids unremarkable. Cardiovascular: Regular rhythm.  Lungs: Normal work of breathing. Neurologic: No focal deficits.   Lab Results  Component Value Date   CREATININE 0.85 11/21/2021   BUN 16 11/21/2021   NA 140 11/21/2021   K 3.8 11/21/2021   CL 106 11/21/2021   CO2 28 11/21/2021   Lab Results  Component Value Date   ALT 19 11/21/2021   AST 26 11/21/2021   ALKPHOS 55 11/21/2021   BILITOT 0.3 11/21/2021   Lab Results  Component Value Date   HGBA1C 6.1 11/21/2021   HGBA1C 5.3 04/14/2021   HGBA1C 5.8 (H) 03/07/2021   HGBA1C 6.1 (H) 11/25/2020   HGBA1C 5.9 06/21/2020   Lab Results  Component Value Date   INSULIN 6.0 11/25/2020   INSULIN 4.3 10/09/2019   INSULIN 8.6 07/05/2019   INSULIN 6.4 12/05/2018   INSULIN 3.6 04/27/2018   Lab Results  Component Value Date   TSH 1.770 04/14/2021   Lab Results  Component Value Date   CHOL 141 11/21/2021   HDL 61.60 11/21/2021   LDLCALC 62 11/21/2021   TRIG 87.0 11/21/2021   CHOLHDL 2 11/21/2021   Lab Results  Component Value Date   VD25OH 48.25 11/21/2021  VD25OH 55.3 04/14/2021   VD25OH 66.2 11/25/2020   Lab Results  Component Value Date   WBC 3.5 (L) 11/21/2021   HGB 12.7 11/21/2021   HCT 38.3 11/21/2021   MCV 95.5 11/21/2021   PLT 152.0 11/21/2021   Lab Results  Component Value Date   IRON 42 04/14/2021   TIBC 231 (L) 04/14/2021   FERRITIN 338 (H) 04/14/2021   Attestation Statements:   Reviewed by clinician on day of visit: allergies, medications, problem list, medical history, surgical history, family history, social history, and previous encounter notes.  I have personally spent 40 minutes total time today in preparation, patient  care, and documentation for this visit, including the following: review of clinical lab tests; review of medical tests/procedures/services.   I, [name], am acting as transcriptionist for [name], MD.  I have reviewed the above documentation for accuracy and completeness, and I agree with the above. -  [name], MD

## 2022-01-14 ENCOUNTER — Ambulatory Visit (INDEPENDENT_AMBULATORY_CARE_PROVIDER_SITE_OTHER): Payer: Medicare Other | Admitting: Family Medicine

## 2022-01-14 VITALS — BP 110/62 | HR 72 | Ht 64.0 in | Wt 220.0 lb

## 2022-01-14 DIAGNOSIS — R252 Cramp and spasm: Secondary | ICD-10-CM

## 2022-01-14 DIAGNOSIS — I2510 Atherosclerotic heart disease of native coronary artery without angina pectoris: Secondary | ICD-10-CM | POA: Diagnosis not present

## 2022-01-14 DIAGNOSIS — M170 Bilateral primary osteoarthritis of knee: Secondary | ICD-10-CM | POA: Diagnosis not present

## 2022-01-14 DIAGNOSIS — D510 Vitamin B12 deficiency anemia due to intrinsic factor deficiency: Secondary | ICD-10-CM | POA: Diagnosis not present

## 2022-01-14 DIAGNOSIS — E538 Deficiency of other specified B group vitamins: Secondary | ICD-10-CM | POA: Diagnosis not present

## 2022-01-14 DIAGNOSIS — M79601 Pain in right arm: Secondary | ICD-10-CM

## 2022-01-14 DIAGNOSIS — S46811D Strain of other muscles, fascia and tendons at shoulder and upper arm level, right arm, subsequent encounter: Secondary | ICD-10-CM | POA: Diagnosis not present

## 2022-01-14 MED ORDER — CYANOCOBALAMIN 1000 MCG/ML IJ SOLN
1000.0000 ug | Freq: Once | INTRAMUSCULAR | Status: AC
  Administered 2022-01-14: 1000 ug via INTRAMUSCULAR

## 2022-01-14 NOTE — Assessment & Plan Note (Signed)
Right arm pain does seem to be more in the shoulder but does have significant improvement in range of motion.  Work with physical therapy and has now done well and follow-up with me again 6 to 12 weeks.

## 2022-01-14 NOTE — Patient Instructions (Addendum)
Good to see you  Congrats on 50 Spenco Total Support [employer] B12 injection today Follow up in 3 months

## 2022-01-14 NOTE — Assessment & Plan Note (Signed)
Worsening leg cramps again.  This is going on nearly 5 years intermittently.  Patient has had difficulty with B12, iron deficiency, as well as abnormal blood level of copper.  Hopefully patient will respond to the B12 injection given today and we will see how patient responds.  Otherwise further work-up could include nerve conduction study and laboratory work-up.  Follow-up with me again in 4 to 6 weeks

## 2022-01-14 NOTE — Assessment & Plan Note (Signed)
B12 injection given today in case this is secondary to the spasms in the legs.

## 2022-01-14 NOTE — Assessment & Plan Note (Addendum)
Known arthritic changes.  Patient seems to be doing okay at the moment.  Status post replacement.

## 2022-01-14 NOTE — Assessment & Plan Note (Signed)
Improvement noted over 2 months of physical therapy.  Does have good range of motion actively.  Patient does have still some mild weakness of the rotator cuff and we can continue to monitor with patient following up again in 2 to 3 months

## 2022-02-04 ENCOUNTER — Ambulatory Visit: Payer: Medicare Other | Admitting: Adult Health

## 2022-02-04 ENCOUNTER — Ambulatory Visit (INDEPENDENT_AMBULATORY_CARE_PROVIDER_SITE_OTHER): Payer: Medicare Other | Admitting: Family Medicine

## 2022-02-04 ENCOUNTER — Encounter (INDEPENDENT_AMBULATORY_CARE_PROVIDER_SITE_OTHER): Payer: Self-pay | Admitting: Family Medicine

## 2022-02-04 VITALS — BP 134/76 | HR 70 | Temp 97.9°F | Ht 64.0 in | Wt 217.0 lb

## 2022-02-04 DIAGNOSIS — Z6837 Body mass index (BMI) 37.0-37.9, adult: Secondary | ICD-10-CM

## 2022-02-04 DIAGNOSIS — E559 Vitamin D deficiency, unspecified: Secondary | ICD-10-CM | POA: Diagnosis not present

## 2022-02-04 DIAGNOSIS — K219 Gastro-esophageal reflux disease without esophagitis: Secondary | ICD-10-CM

## 2022-02-04 DIAGNOSIS — E669 Obesity, unspecified: Secondary | ICD-10-CM

## 2022-02-04 MED ORDER — VITAMIN D (ERGOCALCIFEROL) 1.25 MG (50000 UNIT) PO CAPS: 50000.0000 [IU] | ORAL_CAPSULE | ORAL | 0 refills | Status: DC

## 2022-02-11 ENCOUNTER — Ambulatory Visit (INDEPENDENT_AMBULATORY_CARE_PROVIDER_SITE_OTHER): Payer: Medicare Other

## 2022-02-11 DIAGNOSIS — E538 Deficiency of other specified B group vitamins: Secondary | ICD-10-CM

## 2022-02-11 MED ORDER — CYANOCOBALAMIN 1000 MCG/ML IJ SOLN
1000.0000 ug | Freq: Once | INTRAMUSCULAR | Status: AC
  Administered 2022-02-11: 1000 ug via INTRAMUSCULAR

## 2022-02-11 NOTE — Progress Notes (Signed)
Patient given B12 injection per Dr. Tamala Julian. Injection given IM in patients left deltoid, Patient handled injection well.

## 2022-02-12 ENCOUNTER — Other Ambulatory Visit: Payer: Self-pay | Admitting: Neurology

## 2022-02-12 ENCOUNTER — Encounter: Payer: Self-pay | Admitting: Internal Medicine

## 2022-02-12 NOTE — Progress Notes (Signed)
Virtual Visit via Video Note  I connected with Heather [name] on 02/12/22 at  3:40 PM EST by a video enabled telemedicine application and verified that I am speaking with the correct person using two identifiers.   I discussed the limitations of evaluation and management by telemedicine and the availability of in person appointments. The patient expressed understanding and agreed to proceed.  Present for the visit:  Myself, Dr Billey Gosling, [name].  The patient is currently at home and I am in the office.    No referring provider.    History of Present Illness: This is an acute visit for cough  Persistent cough x several weeks.  At this point she cannot remember if she had a lot of cold symptoms when this first started.  The cough now is intermittent.  Worse with stress, if she gets cold.   Sleeps with cpap - sleeping ok.  Cough is getting better.  Seems to be tapering off.  She has chronic shortness of breath and that is not worse.  Her husband seems to be more concerned and she is.   Review of Systems  Constitutional:  Negative for fever.  HENT:  Positive for congestion (in am and at night). Negative for ear pain, sinus pain and sore throat.   Respiratory:  Positive for cough (dry) and shortness of breath (with exertion - chronic). Negative for sputum production and wheezing.   Gastrointestinal:  Negative for heartburn.  Neurological:  Negative for headaches.      Social History   Socioeconomic History   Marital status: Married    Spouse name: [name]   Number of children: 2   Years of education: College   Highest education level: Not on file  Occupational History   Occupation: Retired  Tobacco Use   Smoking status: Former    Packs/day: 2.00    Years: 20.00    Total pack years: 40.00    Types: Cigarettes    Quit date: 03/03/1979    Years since quitting: 42.9   Smokeless tobacco: Never  Vaping Use   Vaping Use: Never used  Substance and Sexual Activity    Alcohol use: Yes    Comment: rare - TWICE A YR   Drug use: No   Sexual activity: Yes    Birth control/protection: Surgical  Other Topics Concern   Not on file  Social History Narrative   Patient is married [name]).   Right Handed   Drinks 1 cup caffeine   Social Determinants of Health   Financial Resource Strain: Low Risk  (11/27/2020)   Overall Financial Resource Strain (CARDIA)    Difficulty of Paying Living Expenses: Not hard at all  Food Insecurity: No Food Insecurity (10/24/2021)   Hunger Vital Sign    Worried About Running Out of Food in the Last Year: Never true    Ran Out of Food in the Last Year: Never true  Transportation Needs: No Transportation Needs (10/24/2021)   PRAPARE - Hydrologist (Medical): No    Lack of Transportation (Non-Medical): No  Physical Activity: Sufficiently Active (11/24/2018)   Exercise Vital Sign    Days of Exercise per Week: 4 days    Minutes of Exercise per Session: 60 min  Stress: No Stress Concern Present (11/27/2020)   [place]    Feeling of Stress : Not at all  Social Connections: Unknown (11/27/2020)   Social Connection and Isolation  Panel [NHANES]    Frequency of Communication with Friends and Family: Not on file    Frequency of Social Gatherings with Friends and Family: Not on file    Attends Religious Services: Not on file    Active Member of Clubs or Organizations: Not on file    Attends Archivist Meetings: Not on file    Marital Status: Married     Observations/Objective: Appears well in NAD Breathing normally, no coughing  Assessment and Plan:  See Problem List for Assessment and Plan of chronic medical problems.   Follow Up Instructions:    I discussed the assessment and treatment plan with the patient. The patient was provided an opportunity to ask questions and all were answered. The patient agreed with the plan  and demonstrated an understanding of the instructions.   The patient was advised to call back or seek an in-person evaluation if the symptoms worsen or if the condition fails to improve as anticipated.    [name], MD

## 2022-02-13 ENCOUNTER — Telehealth (INDEPENDENT_AMBULATORY_CARE_PROVIDER_SITE_OTHER): Payer: Medicare Other | Admitting: Internal Medicine

## 2022-02-13 DIAGNOSIS — I2510 Atherosclerotic heart disease of native coronary artery without angina pectoris: Secondary | ICD-10-CM | POA: Diagnosis not present

## 2022-02-13 DIAGNOSIS — R053 Chronic cough: Secondary | ICD-10-CM | POA: Diagnosis not present

## 2022-02-13 NOTE — Assessment & Plan Note (Addendum)
Subacute Ongoing for several weeks, but she feels it is getting better Dry cough, intermittent Cough seems to be worse when she gets stressed before she gets cold No concerning shortness of breath, wheezing or fevers, no GERD Likely postviral Expected to continuing to improve Discussed symptomatic treatment Call if no improvement/resolution

## 2022-02-16 ENCOUNTER — Telehealth: Payer: Medicare Other | Admitting: Internal Medicine

## 2022-02-16 NOTE — Progress Notes (Signed)
Chief Complaint:   OBESITY Heather [name] is here to discuss her progress with her obesity treatment plan along with follow-up of her obesity related diagnoses. Heather [name] is on the Category 2 Plan and states she is following her eating plan approximately 2% of the time. Heather [name] states she is on the elliptical 2 times per week.    Today's visit was #: 48 Starting weight: 268 lbs Starting date: 12/23/2016 Today's weight: 217 lbs Today's date: 02/04/2022 Total lbs lost to date: 57 Total lbs lost since last in-office visit: 0  Interim History: Heather [name] has been very busy with a lot of social commitments and eating out. She hasn't been able to meal plan and she is dealing increased health issues with her husband. She is not doing well with meeting her protein goals and her RMR has likely decreased.   Subjective:   1. Gastroesophageal reflux disease without esophagitis Heather [name] is with a hiatal hernia. She notes overeating significant worsens her symptoms. She notes  constipation also worsens symptoms.   2. Vitamin D deficiency Heather [name] is on prescription Vitamin D, and she requests a refill.   Assessment/Plan:   1. Gastroesophageal reflux disease without esophagitis Heather [name] is ok to take miralax as needed, and continue to work on her weight loss.   2. Vitamin D deficiency We will refill prescription Vitamin D for 90 days. Heather [name] will follow-up for routine testing of Vitamin D, at least 2-3 times per year to avoid over-replacement.  - Vitamin D, Ergocalciferol, (DRISDOL) 1.25 MG (50000 UNIT) CAPS capsule; Take 1 capsule (50,000 Units total) by mouth every 7 (seven) days.  Dispense: 13 capsule; Refill: 0  3. Obesity, Current BMI 37.3 Heather [name] is currently in the action stage of change. As such, her goal is to continue with weight loss efforts. She has agreed to the Category 2 Plan.   Exercise goals: As is.   Behavioral modification strategies: increasing lean protein intake and emotional eating  strategies.  Heather [name] has agreed to follow-up with our clinic in 4 weeks. She was informed of the importance of frequent follow-up visits to maximize her success with intensive lifestyle modifications for her multiple health conditions.   Objective:   Blood pressure 134/76, pulse 70, temperature 97.9 F (36.6 C), height 5' 4"  (1.626 m), weight 217 lb (98.4 kg), SpO2 100 %. Body mass index is 37.25 kg/m.  General: Cooperative, alert, well developed, in no acute distress. HEENT: Conjunctivae and lids unremarkable. Cardiovascular: Regular rhythm.  Lungs: Normal work of breathing. Neurologic: No focal deficits.   Lab Results  Component Value Date   CREATININE 0.85 11/21/2021   BUN 16 11/21/2021   NA 140 11/21/2021   K 3.8 11/21/2021   CL 106 11/21/2021   CO2 28 11/21/2021   Lab Results  Component Value Date   ALT 19 11/21/2021   AST 26 11/21/2021   ALKPHOS 55 11/21/2021   BILITOT 0.3 11/21/2021   Lab Results  Component Value Date   HGBA1C 6.1 11/21/2021   HGBA1C 5.3 04/14/2021   HGBA1C 5.8 (H) 03/07/2021   HGBA1C 6.1 (H) 11/25/2020   HGBA1C 5.9 06/21/2020   Lab Results  Component Value Date   INSULIN 6.0 11/25/2020   INSULIN 4.3 10/09/2019   INSULIN 8.6 07/05/2019   INSULIN 6.4 12/05/2018   INSULIN 3.6 04/27/2018   Lab Results  Component Value Date   TSH 1.770 04/14/2021   Lab Results  Component Value Date   CHOL 141 11/21/2021   HDL 61.60 11/21/2021  LDLCALC 62 11/21/2021   TRIG 87.0 11/21/2021   CHOLHDL 2 11/21/2021   Lab Results  Component Value Date   VD25OH 48.25 11/21/2021   VD25OH 55.3 04/14/2021   VD25OH 66.2 11/25/2020   Lab Results  Component Value Date   WBC 3.5 (L) 11/21/2021   HGB 12.7 11/21/2021   HCT 38.3 11/21/2021   MCV 95.5 11/21/2021   PLT 152.0 11/21/2021   Lab Results  Component Value Date   IRON 42 04/14/2021   TIBC 231 (L) 04/14/2021   FERRITIN 338 (H) 04/14/2021   Attestation Statements:   Reviewed by clinician on  day of visit: allergies, medications, problem list, medical history, surgical history, family history, social history, and previous encounter notes.   I, [name], am acting as transcriptionist for [name], MD.  I have reviewed the above documentation for accuracy and completeness, and I agree with the above. -  [name], MD

## 2022-02-26 ENCOUNTER — Ambulatory Visit (INDEPENDENT_AMBULATORY_CARE_PROVIDER_SITE_OTHER): Payer: Medicare Other | Admitting: Podiatry

## 2022-02-26 ENCOUNTER — Encounter: Payer: Self-pay | Admitting: Podiatry

## 2022-02-26 DIAGNOSIS — D2372 Other benign neoplasm of skin of left lower limb, including hip: Secondary | ICD-10-CM | POA: Diagnosis not present

## 2022-02-26 DIAGNOSIS — B351 Tinea unguium: Secondary | ICD-10-CM

## 2022-02-26 DIAGNOSIS — D2371 Other benign neoplasm of skin of right lower limb, including hip: Secondary | ICD-10-CM | POA: Diagnosis not present

## 2022-02-26 DIAGNOSIS — M79676 Pain in unspecified toe(s): Secondary | ICD-10-CM | POA: Diagnosis not present

## 2022-02-26 DIAGNOSIS — D689 Coagulation defect, unspecified: Secondary | ICD-10-CM | POA: Diagnosis not present

## 2022-02-26 NOTE — Progress Notes (Signed)
She presents today chief complaint of painful elongated toenails and calluses bilateral.  Objective: Vital signs stable oriented x 3 pulses are palpable.  There is no erythema edema cellulitis drainage or odor.  Toenails are long thick yellow dystrophic with mycotic benign skin lesions are noted plantar aspect of the bilateral forefoot.  No signs of infection no open lesions or wounds.  Assessment: Pain limb secondary to onychomycosis and benign skin lesions.  Plan: Debrided benign skin lesions debridement of toenails 1 through 5 bilateral.

## 2022-03-05 ENCOUNTER — Encounter: Payer: Self-pay | Admitting: Orthopaedic Surgery

## 2022-03-05 ENCOUNTER — Ambulatory Visit (INDEPENDENT_AMBULATORY_CARE_PROVIDER_SITE_OTHER): Payer: Medicare Other

## 2022-03-05 ENCOUNTER — Ambulatory Visit (INDEPENDENT_AMBULATORY_CARE_PROVIDER_SITE_OTHER): Payer: Medicare Other | Admitting: Orthopaedic Surgery

## 2022-03-05 DIAGNOSIS — Z96652 Presence of left artificial knee joint: Secondary | ICD-10-CM

## 2022-03-05 NOTE — Progress Notes (Signed)
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  She is an active 77 year old female and says she is doing very well with range of motion and strength of that knee.  She is having some right knee pain and there is known arthritis in the right knee but she is not having enough pain to do anything with for the right knee.  She is very pleased with her left knee replacement.  She has had no acute change in her medical status.  X-rays of the left knee show well-seated total knee arthroplasty with no complicating features.  Examination of her left knee shows full extension and full flexion.  The knee is ligamentously stable.  At this point follow-up for left knee can be as needed.  If her right knee starts to have issues she knows to let us know.  I did let her know for things that need to bring her back for her left knee if there are issues as well.  All question concerns were answered and addressed.

## 2022-03-18 ENCOUNTER — Encounter (INDEPENDENT_AMBULATORY_CARE_PROVIDER_SITE_OTHER): Payer: Self-pay | Admitting: Physician Assistant

## 2022-03-18 ENCOUNTER — Ambulatory Visit (INDEPENDENT_AMBULATORY_CARE_PROVIDER_SITE_OTHER): Payer: Medicare Other | Admitting: Physician Assistant

## 2022-03-18 VITALS — BP 129/77 | HR 68 | Temp 98.1°F | Ht 64.0 in | Wt 215.0 lb

## 2022-03-18 DIAGNOSIS — E559 Vitamin D deficiency, unspecified: Secondary | ICD-10-CM

## 2022-03-18 DIAGNOSIS — E669 Obesity, unspecified: Secondary | ICD-10-CM | POA: Diagnosis not present

## 2022-03-18 DIAGNOSIS — E119 Type 2 diabetes mellitus without complications: Secondary | ICD-10-CM

## 2022-03-18 DIAGNOSIS — Z6837 Body mass index (BMI) 37.0-37.9, adult: Secondary | ICD-10-CM | POA: Diagnosis not present

## 2022-03-25 NOTE — Progress Notes (Unsigned)
Chief Complaint:   OBESITY Heather [name] is here to discuss her progress with her obesity treatment plan along with follow-up of her obesity related diagnoses. Heather [name] is on the Category 2 Plan and states she is following her eating plan approximately 100% of the time. Heather [name] states she is elliptical 35 minutes 7 times per week.  Today's visit was #: 59 Starting weight: 268 lbs Starting date: 12/23/2016 Today's weight: 215 lbs Today's date: 03/18/2022 Total lbs lost to date: 53 lbs Total lbs lost since last in-office visit: 2  Interim History: Heather [name] has done well overall with weight loss.  She reports she did a lot of celebration eating over the holidays and was actually up about 10 pounds, but she got back on track with her nutrition plan quickly.  She is down 2 pounds today and overall down 53 pounds. Her hunger is well-controlled when she is following her plan. She reports she is focusing on some new recipes and has been following a low carb plan more recently to " kick start" her weight loss again.  Subjective:   1. Type 2 diabetes mellitus without complication, without long-term current use of insulin (HCC) Heather [name]'s A1c at 6.1 on 11/21/21-nearing goal.  Not on medication.  Working on decreasing simple carbs, increasing lean protein and exercise to promote weight loss and improve glycemic control.  2. Vitamin D deficiency Level of 48.25 on 11/21/21 not at goal.  Taking ergocalciferol once weekly with no side effects.  Reports no refills needed today.  Assessment/Plan:   1. Type 2 diabetes mellitus without complication, without long-term current use of insulin (HCC) Continue Prescribed Nutrition Plan and exercise to promote weight loss and improve glycemic control.  Will plan to recheck labs in 2-3 months.  2. Vitamin D deficiency Continue ergocalciferol once weekly.  Recheck level 2-3 times a year to avoid over supplementation.   3. Obesity, Current BMI 37.0 Heather [name] is currently in the  action stage of change. As such, her goal is to continue with weight loss efforts. She has agreed to the Category 2 Plan and following a lower carbohydrate, vegetable and lean protein rich diet plan.   Exercise goals: As is.  Behavioral modification strategies: increasing lean protein intake, decreasing simple carbohydrates, and meal planning and cooking strategies.  Heather [name] has agreed to follow-up with our clinic in 3 weeks. She was informed of the importance of frequent follow-up visits to maximize her success with intensive lifestyle modifications for her multiple health conditions.   Objective:   Blood pressure 129/77, pulse 68, temperature 98.1 F (36.7 C), height '5\' 4"'$  (1.626 m), weight 215 lb (97.5 kg), SpO2 94 %. Body mass index is 36.9 kg/m.  General: Cooperative, alert, well developed, in no acute distress. HEENT: Conjunctivae and lids unremarkable. Cardiovascular: Regular rhythm.  Lungs: Normal work of breathing. Neurologic: No focal deficits.   Lab Results  Component Value Date   CREATININE 0.85 11/21/2021   BUN 16 11/21/2021   NA 140 11/21/2021   K 3.8 11/21/2021   CL 106 11/21/2021   CO2 28 11/21/2021   Lab Results  Component Value Date   ALT 19 11/21/2021   AST 26 11/21/2021   ALKPHOS 55 11/21/2021   BILITOT 0.3 11/21/2021   Lab Results  Component Value Date   HGBA1C 6.1 11/21/2021   HGBA1C 5.3 04/14/2021   HGBA1C 5.8 (H) 03/07/2021   HGBA1C 6.1 (H) 11/25/2020   HGBA1C 5.9 06/21/2020   Lab Results  Component Value Date  INSULIN 6.0 11/25/2020   INSULIN 4.3 10/09/2019   INSULIN 8.6 07/05/2019   INSULIN 6.4 12/05/2018   INSULIN 3.6 04/27/2018   Lab Results  Component Value Date   TSH 1.770 04/14/2021   Lab Results  Component Value Date   CHOL 141 11/21/2021   HDL 61.60 11/21/2021   LDLCALC 62 11/21/2021   TRIG 87.0 11/21/2021   CHOLHDL 2 11/21/2021   Lab Results  Component Value Date   VD25OH 48.25 11/21/2021   VD25OH 55.3 04/14/2021    VD25OH 66.2 11/25/2020   Lab Results  Component Value Date   WBC 3.5 (L) 11/21/2021   HGB 12.7 11/21/2021   HCT 38.3 11/21/2021   MCV 95.5 11/21/2021   PLT 152.0 11/21/2021   Lab Results  Component Value Date   IRON 42 04/14/2021   TIBC 231 (L) 04/14/2021   FERRITIN 338 (H) 04/14/2021   Attestation Statements:   Reviewed by clinician on day of visit: allergies, medications, problem list, medical history, surgical history, family history, social history, and previous encounter notes.  I, Brendell Tyus, am acting as transcriptionist for [employer], PA.  I have reviewed the above documentation for accuracy and completeness, and I agree with the above. -  Sierra Bissonette,PA-C

## 2022-04-02 ENCOUNTER — Encounter (HOSPITAL_COMMUNITY): Payer: Self-pay | Admitting: Cardiology

## 2022-04-02 ENCOUNTER — Encounter: Payer: Self-pay | Admitting: Family Medicine

## 2022-04-02 ENCOUNTER — Encounter: Payer: Self-pay | Admitting: Internal Medicine

## 2022-04-02 ENCOUNTER — Encounter: Payer: Self-pay | Admitting: Neurology

## 2022-04-08 ENCOUNTER — Encounter (INDEPENDENT_AMBULATORY_CARE_PROVIDER_SITE_OTHER): Payer: Self-pay | Admitting: Family Medicine

## 2022-04-08 ENCOUNTER — Ambulatory Visit (INDEPENDENT_AMBULATORY_CARE_PROVIDER_SITE_OTHER): Payer: Medicare Other | Admitting: Family Medicine

## 2022-04-08 VITALS — BP 132/76 | HR 68 | Temp 97.7°F | Ht 64.0 in | Wt 212.0 lb

## 2022-04-08 DIAGNOSIS — E669 Obesity, unspecified: Secondary | ICD-10-CM | POA: Insufficient documentation

## 2022-04-08 DIAGNOSIS — Z6836 Body mass index (BMI) 36.0-36.9, adult: Secondary | ICD-10-CM | POA: Diagnosis not present

## 2022-04-08 DIAGNOSIS — F439 Reaction to severe stress, unspecified: Secondary | ICD-10-CM

## 2022-04-13 NOTE — Progress Notes (Unsigned)
[facility] [place] Phone: [phone] Subjective:   [name], am serving as a scribe for Dr. Hulan Saas.  I'm seeing this patient by the request  of:  [name], MD  CC: Multiple joint complaints  [id number]  01/14/2022 Improvement noted over 2 months of physical therapy.  Does have good range of motion actively.  Patient does have still some mild weakness of the rotator cuff and we can continue to monitor with patient following up again in 2 to 3 months     B12 injection given today in case this is secondary to the spasms in the legs.     Right arm pain does seem to be more in the shoulder but does have significant improvement in range of motion. Work with physical therapy and has now done well and follow-up with me again 6 to 12 weeks.   Known arthritic changes. Patient seems to be doing okay at the moment. Status post replacement.   Worsening leg cramps again. This is going on nearly 5 years intermittently. Patient has had difficulty with B12, iron deficiency, as well as abnormal blood level of copper. Hopefully patient will respond to the B12 injection given today and we will see how patient responds. Otherwise further work-up could include nerve conduction study and laboratory work-up. Follow-up with me again in 4 to 6 weeks   Update 04/14/2022 NATSUMI CLINT is a 77 y.o. female coming in with complaint of R shoulder, B knee pain and cramping. Patient states that her knees are doing well except with barometric pressure changes.   R shoulder pain is much less. Able to move arm overhead without pain.   Still has cramping in legs with standing.     Past Medical History:  Diagnosis Date   Abdominal hernia    Abnormal Pap smear    ALLERGIC RHINITIS 10/22/2006   Allergy    ANEMIA 12/18/2008   Arthritis    scoliosis moderate deg changes lumbar Xray 11/04/17    Asthma    ASYMPTOMATIC  POSTMENOPAUSAL STATUS 11/22/2007   Back pain    CAD (coronary artery disease)    in LAD   CHF (congestive heart failure) ([place])    Complication of anesthesia 2010   pt states she woke up during colonoscopy and was aware of "losing control of breathing" during mammoplasty   Degenerative arthritis    Diverticulosis    Dyslipidemia    Fibroid    Frequent headaches    GERD 07/20/2007   GOITER, MULTINODULAR 07/20/2007   Headache(784.0) 07/20/2007   HEARING LOSS 11/22/2007   Hiatal hernia    Hiatal hernia    large   History of chicken pox    Hx of colposcopy with cervical biopsy    HYPERCHOLESTEROLEMIA 01/13/2007   Hyperglycemia    HYPERTENSION 10/22/2006   Lung nodule    unchanged since 05/26/13    Migraines    NASH (nonalcoholic steatohepatitis)    Nocturnal hypoxemia 02/17/2013   Obesity    Obstructive sleep apnea    OSA on CPAP    per neurology   OSTEOARTHRITIS 10/22/2006   Other chronic nonalcoholic liver disease 123456   Pre-diabetes    Sedimentation rate elevation    Sleep apnea    on cpap   Urine incontinence    UTI (urinary tract infection)    Past Surgical History:  Procedure Laterality Date   BREAST SURGERY  1984   Breast  reduction b/l    CATARACT EXTRACTION, BILATERAL     DEXA  08/2005   DILATION AND CURETTAGE OF UTERUS     ELECTROCARDIOGRAM  10/15/2006   ESOPHAGOGASTRODUODENOSCOPY  12/08/2005   FOOT SURGERY     hammertoe and bunion 05/2017    Stress Cardiolite  10/21/2005   sweat gland removal     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   Procedure: Left TOTAL KNEE ARTHROPLASTY;  Surgeon: Mcarthur Rossetti, MD;  Location: [facility];  Service: Orthopedics;  Laterality: Left;   WISDOM TOOTH EXTRACTION     Social History   Socioeconomic History   Marital status: Married    Spouse name: [name]   Number of children: 2   Years of education: College   Highest education level: Not on file  Occupational History   Occupation: Retired  Tobacco Use    Smoking status: Former    Packs/day: 2.00    Years: 20.00    Total pack years: 40.00    Types: Cigarettes    Quit date: 03/03/1979    Years since quitting: 43.1   Smokeless tobacco: Never  Vaping Use   Vaping Use: Never used  Substance and Sexual Activity   Alcohol use: Yes    Comment: rare - TWICE A YR   Drug use: No   Sexual activity: Yes    Birth control/protection: Surgical  Other Topics Concern   Not on file  Social History Narrative   Patient is married [name]).   Right Handed   Drinks 1 cup caffeine   Social Determinants of Health   Financial Resource Strain: Low Risk  (11/27/2020)   Overall Financial Resource Strain (CARDIA)    Difficulty of Paying Living Expenses: Not hard at all  Food Insecurity: No Food Insecurity (10/24/2021)   Hunger Vital Sign    Worried About Running Out of Food in the Last Year: Never true    Ran Out of Food in the Last Year: Never true  Transportation Needs: No Transportation Needs (10/24/2021)   PRAPARE - Hydrologist (Medical): No    Lack of Transportation (Non-Medical): No  Physical Activity: Sufficiently Active (11/24/2018)   Exercise Vital Sign    Days of Exercise per Week: 4 days    Minutes of Exercise per Session: 60 min  Stress: No Stress Concern Present (11/27/2020)   [place]    Feeling of Stress : Not at all  Social Connections: Unknown (11/27/2020)   Social Connection and Isolation Panel [NHANES]    Frequency of Communication with Friends and Family: Not on file    Frequency of Social Gatherings with Friends and Family: Not on file    Attends Religious Services: Not on file    Active Member of Clubs or Organizations: Not on file    Attends Archivist Meetings: Not on file    Marital Status: Married   Allergies  Allergen Reactions   Aspirin Hives   Coconut (Cocos Nucifera) Hives   Lisinopril Cough   Metoprolol Itching    Other     Latex paint, the smell causes hives. Not allergic to latex gloves   General anesthesia - has had issues in past where anesthesia was not properly administered and caused side effects   Peanut-Containing Drug Products Hives and Itching   Prednisone     Unknown reaction    Strawberry Extract Hives   Penicillins Rash   Family History  Problem  Relation Age of Onset   Asthma Mother    Depression Mother    Bipolar disorder Mother    Dementia Mother    Arthritis Mother    Breast cancer Mother        primary   Colon cancer Mother        mets from breast   Cancer Mother        colon   Hypertension Father    Migraines Father    Cancer Maternal Aunt        ?   Heart disease Maternal Grandmother    Cancer Maternal Grandfather        stomach ?   Sleep apnea Neg Hx    Esophageal cancer Neg Hx    Stomach cancer Neg Hx    Rectal cancer Neg Hx      Current Outpatient Medications (Cardiovascular):    ezetimibe (ZETIA) 10 MG tablet, Take 1 tablet (10 mg total) by mouth daily.   furosemide (LASIX) 40 MG tablet, Take 1 tablet (40 mg total) by mouth daily as needed.   rosuvastatin (CRESTOR) 40 MG tablet, Take 1 tablet (40 mg total) by mouth daily. At night  Current Outpatient Medications (Respiratory):    cetirizine (ZYRTEC) 10 MG tablet, Take 10 mg by mouth daily.  Current Outpatient Medications (Analgesics):    acetaminophen (TYLENOL) 500 MG tablet, Take 1,000 mg by mouth 3 (three) times daily as needed for moderate pain.   naproxen sodium (ALEVE) 220 MG tablet, Take 220 mg by mouth daily as needed (pain).   traMADol (ULTRAM) 50 MG tablet, Take 1-2 tablets (50-100 mg total) by mouth every 12 (twelve) hours as needed.  Current Outpatient Medications (Hematological):    clopidogrel (PLAVIX) 75 MG tablet, TAKE 1 TABLET DAILY   Cyanocobalamin 1000 MCG CAPS, Take 1 capsule by mouth daily.   Ferrous Sulfate (IRON) 325 (65 FE) MG TABS, Take 1 tablet by mouth daily.  Current  Outpatient Medications (Other):    Boswellia-Glucosamine-Vit D (OSTEO BI-FLEX ONE PER DAY PO), Take 1 tablet by mouth daily.   Calcium Carb-Cholecalciferol (CHEWABLE CALCIUM/D3 PO), Take 1 tablet by mouth daily.   Coenzyme Q10 (CO Q 10 PO), Take 400 mg by mouth.   gabapentin (NEURONTIN) 100 MG capsule, TAKE 3 CAPSULES BY MOUTH AT BEDTIME   KLOR-CON M20 20 MEQ tablet, TAKE 1 TABLET DAILY   Multiple Vitamin (MULTIVITAMIN) tablet, Take 1 tablet by mouth daily.   Omega-3 Fatty Acids (FISH OIL PO), Take 2 g by mouth daily.   pantoprazole (PROTONIX) 20 MG tablet, Take 1 tablet (20 mg total) by mouth 2 (two) times daily. 30 min before food   polyethylene glycol (MIRALAX / GLYCOLAX) 17 g packet, Take 17 g by mouth daily as needed for moderate constipation.   pyridoxine (B-6) 200 MG tablet, Take 200 mg by mouth daily.   topiramate (TOPAMAX) 25 MG tablet, TAKE 1 TABLET 3 TIMES A DAY   trolamine salicylate (ASPERCREME) 10 % cream, Apply 1 application topically as needed for muscle pain.   trospium (SANCTURA) 20 MG tablet, Take 1 tablet (20 mg total) by mouth 2 (two) times daily. Prn overactive bladder (Patient taking differently: Take 20 mg by mouth 2 (two) times daily.)   TURMERIC PO, Take 538 mg by mouth daily.   Vitamin D, Ergocalciferol, (DRISDOL) 1.25 MG (50000 UNIT) CAPS capsule, Take 1 capsule (50,000 Units total) by mouth every 7 (seven) days.   vitamin E 200 UNIT capsule, Take 200 Units by mouth  daily.   Reviewed prior external information including notes and imaging from  primary care provider As well as notes that were available from care everywhere and other healthcare systems.  Past medical history, social, surgical and family history all reviewed in electronic medical record.  No pertanent information unless stated regarding to the chief complaint.   Review of Systems:  No headache, visual changes, nausea, vomiting, diarrhea, constipation, dizziness, abdominal pain, skin rash, fevers,  chills, night sweats, weight loss, swollen lymph nodes, body aches, joint swelling, chest pain, shortness of breath, mood changes. POSITIVE muscle aches  Objective  Blood pressure 112/82, pulse 64, height 5' 4"$  (1.626 m), weight 214 lb (97.1 kg), SpO2 94 %.   General: No apparent distress alert and oriented x3 mood and affect normal, dressed appropriately.  HEENT: Pupils equal, extraocular movements intact  Respiratory: Patient's speak in full sentences and does not appear short of breath  Cardiovascular: No lower extremity edema, non tender, no erythema  Patient does have swelling noted of the right knee.  Low back and has difficulty even getting to 5 degrees of extension.  Tightness with FABER test bilaterally.  Increased tightness noted with radicular symptoms into the right as well. Right knee does have effusion noted.  Crepitus noted.  Instability with valgus and varus force      Impression and Recommendations:    The above documentation has been reviewed and is accurate and complete Lyndal Pulley, DO

## 2022-04-14 ENCOUNTER — Ambulatory Visit (INDEPENDENT_AMBULATORY_CARE_PROVIDER_SITE_OTHER): Payer: Medicare Other | Admitting: Family Medicine

## 2022-04-14 VITALS — BP 112/82 | HR 64 | Ht 64.0 in | Wt 214.0 lb

## 2022-04-14 DIAGNOSIS — M5416 Radiculopathy, lumbar region: Secondary | ICD-10-CM

## 2022-04-14 DIAGNOSIS — M170 Bilateral primary osteoarthritis of knee: Secondary | ICD-10-CM

## 2022-04-14 NOTE — Assessment & Plan Note (Signed)
Does have arthritic changes more right greater than left.  Left has been replaced at this time.  Continue to monitor.  Follow-up again in 2 to 3 months

## 2022-04-14 NOTE — Patient Instructions (Signed)
Shoulder looks phenomenal Keep watching back Elliptical for 10 mins See you again in 2 months

## 2022-04-14 NOTE — Assessment & Plan Note (Signed)
Patient has had lumbar radiculopathy Concerned that she is having some worsening spinal stenosis evidence of more nerve impingement.  Did respond initially to an epidural in June 2022.  Discussed with patient about the possibility of repeating a necessary.  Patient is the primary caregiver for her ailing husband and the social determinants of health as he was going to give her more transportation.  We will continue to monitor.  Encouraged her to continue to work on weight loss.  Has done well but has not been working out as frequently.  Will continue to monitor.  Patient's BMI was as high as 44 and now down to 36.  Total time reviewing patient's chart as well as discussing with patient 31 minutes

## 2022-04-22 ENCOUNTER — Encounter: Payer: Self-pay | Admitting: Internal Medicine

## 2022-04-22 NOTE — Progress Notes (Signed)
Chief Complaint:   OBESITY Heather [name] is here to discuss her progress with her obesity treatment plan along with follow-up of her obesity related diagnoses. Heather [name] is on the Category 2 Plan and following a lower carbohydrate, vegetable and lean protein rich diet plan and states she is following her eating plan approximately 100% of the time. Heather [name] states she is on the elliptical for 30 minutes 3 times per week.  Today's visit was #: 66 Starting weight: 268 lbs Starting date: 12/23/2016 Today's weight: 212 lbs Today's date: 04/08/2022 Total lbs lost to date: 38 Total lbs lost since last in-office visit: 3  Interim History: Heather [name] has done well with weight loss despite it being [employer] season (she works with [employer]). She has done well with avoiding these temptations but her stress is higher.   Subjective:   1. Stress Heather [name] is dealing with a lot of stress right now. She is working on minimizing emotional eating behaviors.   Assessment/Plan:   1. Stress Heather [name] will work on being creative with her low carbohydrate eating plan, so she doesn't feel deprived.   2. BMI 36.0-36.9,adult  3. Obesity, Beginning BMI 44.60 Heather [name] is currently in the action stage of change. As such, her goal is to continue with weight loss efforts. She has agreed to following a lower carbohydrate, vegetable and lean protein rich diet plan.   Exercise goals: As is.   Behavioral modification strategies: meal planning and cooking strategies.  Heather [name] has agreed to follow-up with our clinic in 4 weeks. She was informed of the importance of frequent follow-up visits to maximize her success with intensive lifestyle modifications for her multiple health conditions.   Objective:   Blood pressure 132/76, pulse 68, temperature 97.7 F (36.5 C), height 5' 4"$  (1.626 m), weight 212 lb (96.2 kg), SpO2 95 %. Body mass index is 36.39 kg/m.  General: Cooperative, alert, well developed, in no acute  distress. HEENT: Conjunctivae and lids unremarkable. Cardiovascular: Regular rhythm.  Lungs: Normal work of breathing. Neurologic: No focal deficits.   Lab Results  Component Value Date   CREATININE 0.85 11/21/2021   BUN 16 11/21/2021   NA 140 11/21/2021   K 3.8 11/21/2021   CL 106 11/21/2021   CO2 28 11/21/2021   Lab Results  Component Value Date   ALT 19 11/21/2021   AST 26 11/21/2021   ALKPHOS 55 11/21/2021   BILITOT 0.3 11/21/2021   Lab Results  Component Value Date   HGBA1C 6.1 11/21/2021   HGBA1C 5.3 04/14/2021   HGBA1C 5.8 (H) 03/07/2021   HGBA1C 6.1 (H) 11/25/2020   HGBA1C 5.9 06/21/2020   Lab Results  Component Value Date   INSULIN 6.0 11/25/2020   INSULIN 4.3 10/09/2019   INSULIN 8.6 07/05/2019   INSULIN 6.4 12/05/2018   INSULIN 3.6 04/27/2018   Lab Results  Component Value Date   TSH 1.770 04/14/2021   Lab Results  Component Value Date   CHOL 141 11/21/2021   HDL 61.60 11/21/2021   LDLCALC 62 11/21/2021   TRIG 87.0 11/21/2021   CHOLHDL 2 11/21/2021   Lab Results  Component Value Date   VD25OH 48.25 11/21/2021   VD25OH 55.3 04/14/2021   VD25OH 66.2 11/25/2020   Lab Results  Component Value Date   WBC 3.5 (L) 11/21/2021   HGB 12.7 11/21/2021   HCT 38.3 11/21/2021   MCV 95.5 11/21/2021   PLT 152.0 11/21/2021   Lab Results  Component Value Date   IRON  42 04/14/2021   TIBC 231 (L) 04/14/2021   FERRITIN 338 (H) 04/14/2021   Attestation Statements:   Reviewed by clinician on day of visit: allergies, medications, problem list, medical history, surgical history, family history, social history, and previous encounter notes.  Time spent on visit including pre-visit chart review and post-visit care and charting was 20 minutes.   I, [name], am acting as transcriptionist for [name], MD.  I have reviewed the above documentation for accuracy and completeness, and I agree with the above. -  [name], MD

## 2022-04-23 ENCOUNTER — Ambulatory Visit (INDEPENDENT_AMBULATORY_CARE_PROVIDER_SITE_OTHER): Payer: Medicare Other | Admitting: Emergency Medicine

## 2022-04-23 ENCOUNTER — Encounter: Payer: Self-pay | Admitting: Emergency Medicine

## 2022-04-23 VITALS — BP 124/76 | HR 88 | Temp 98.2°F | Ht 64.0 in | Wt 207.0 lb

## 2022-04-23 DIAGNOSIS — K439 Ventral hernia without obstruction or gangrene: Secondary | ICD-10-CM

## 2022-04-23 DIAGNOSIS — R1084 Generalized abdominal pain: Secondary | ICD-10-CM | POA: Insufficient documentation

## 2022-04-23 LAB — URINALYSIS, ROUTINE W REFLEX MICROSCOPIC
Leukocytes,Ua: NEGATIVE
Nitrite: NEGATIVE
Specific Gravity, Urine: 1.02 (ref 1.000–1.030)
Total Protein, Urine: 30 — AB
Urine Glucose: NEGATIVE
Urobilinogen, UA: 1 (ref 0.0–1.0)
pH: 6 (ref 5.0–8.0)

## 2022-04-23 LAB — COMPREHENSIVE METABOLIC PANEL
ALT: 25 U/L (ref 0–35)
AST: 27 U/L (ref 0–37)
Albumin: 4.3 g/dL (ref 3.5–5.2)
Alkaline Phosphatase: 55 U/L (ref 39–117)
BUN: 23 mg/dL (ref 6–23)
CO2: 31 mEq/L (ref 19–32)
Calcium: 10.5 mg/dL (ref 8.4–10.5)
Chloride: 91 mEq/L — ABNORMAL LOW (ref 96–112)
Creatinine, Ser: 0.85 mg/dL (ref 0.40–1.20)
GFR: 66.98 mL/min (ref >60.00)
Glucose, Bld: 120 mg/dL — ABNORMAL HIGH (ref 70–99)
Potassium: 3.6 mEq/L (ref 3.5–5.1)
Sodium: 132 mEq/L — ABNORMAL LOW (ref 135–145)
Total Bilirubin: 0.8 mg/dL (ref 0.2–1.2)
Total Protein: 8.4 g/dL — ABNORMAL HIGH (ref 6.0–8.3)

## 2022-04-23 LAB — CBC WITH DIFFERENTIAL/PLATELET
Basophils Absolute: 0 10*3/uL (ref 0.0–0.1)
Basophils Relative: 0.3 % (ref 0.0–3.0)
Eosinophils Absolute: 0 10*3/uL (ref 0.0–0.7)
Eosinophils Relative: 0.1 % (ref 0.0–5.0)
HCT: 45.8 % (ref 36.0–46.0)
Hemoglobin: 15.6 g/dL — ABNORMAL HIGH (ref 12.0–15.0)
Lymphocytes Relative: 16.1 % (ref 12.0–46.0)
Lymphs Abs: 0.9 10*3/uL (ref 0.7–4.0)
MCHC: 34 g/dL (ref 30.0–36.0)
MCV: 94.1 fl (ref 78.0–100.0)
Monocytes Absolute: 0.9 10*3/uL (ref 0.1–1.0)
Monocytes Relative: 16.4 % — ABNORMAL HIGH (ref 3.0–12.0)
Neutro Abs: 3.8 10*3/uL (ref 1.4–7.7)
Neutrophils Relative %: 67.1 % (ref 43.0–77.0)
Platelets: 230 10*3/uL (ref 150.0–400.0)
RBC: 4.86 Mil/uL (ref 3.87–5.11)
RDW: 15 % (ref 11.5–15.5)
WBC: 5.7 10*3/uL (ref 4.0–10.5)

## 2022-04-23 LAB — LIPASE: Lipase: 5 U/L — ABNORMAL LOW (ref 11.0–59.0)

## 2022-04-23 NOTE — Assessment & Plan Note (Signed)
Clinically stable.  Normal vital signs.  Afebrile. Benign abdominal examination. No red flag signs or symptoms. Differential diagnosis discussed. Recommend BRAT diet and to stay well-hydrated. Blood work done today. ED precautions given. Advised to contact the office if no better or worse during the next several days.

## 2022-04-23 NOTE — Assessment & Plan Note (Signed)
Large ventral hernia.  No signs of bowel obstruction. Stable.  Nontender. ED precautions given. Diet and nutrition discussed.

## 2022-04-23 NOTE — Progress Notes (Signed)
Heather [name] 77 y.o.   Chief Complaint  Patient presents with   Acute Visit    Abd pain, nausea and vomiting, SOB, started Sunday, patient states she has not been able to eat anything, is drinking liquids     HISTORY OF PRESENT ILLNESS: This is a 77 y.o. female developed left-sided abdominal pain last Saturday followed by nausea and vomiting. Had episode of shortness of breath.  Denies rectal bleeding.  Denies fever or chills. Symptoms improved today.  No longer having abdominal pain.  No longer nauseous. Still not eating solids.  Only able to tolerate liquids. No other complaints or medical concerns today.   HPI   Prior to Admission medications   Medication Sig Start Date End Date Taking? Authorizing Provider  acetaminophen (TYLENOL) 500 MG tablet Take 1,000 mg by mouth 3 (three) times daily as needed for moderate pain.   Yes [provider]  Boswellia-Glucosamine-Vit D (OSTEO BI-FLEX ONE PER DAY PO) Take 1 tablet by mouth daily.   Yes [provider]  Calcium Carb-Cholecalciferol (CHEWABLE CALCIUM/D3 PO) Take 1 tablet by mouth daily.   Yes [provider]  cetirizine (ZYRTEC) 10 MG tablet Take 10 mg by mouth daily.   Yes [provider]  clopidogrel (PLAVIX) 75 MG tablet TAKE 1 TABLET DAILY 01/01/22  Yes Larey Dresser, MD  Coenzyme Q10 (CO Q 10 PO) Take 400 mg by mouth.   Yes [provider]  Cyanocobalamin 1000 MCG CAPS Take 1 capsule by mouth daily.   Yes [provider]  ezetimibe (ZETIA) 10 MG tablet Take 1 tablet (10 mg total) by mouth daily. 09/09/21  Yes Larey Dresser, MD  Ferrous Sulfate (IRON) 325 (65 FE) MG TABS Take 1 tablet by mouth daily.   Yes [provider]  furosemide (LASIX) 40 MG tablet Take 1 tablet (40 mg total) by mouth daily as needed. 07/02/21  Yes McLean-Scocuzza, [name], MD  gabapentin (NEURONTIN) 100 MG capsule TAKE 3 CAPSULES BY MOUTH AT BEDTIME 07/02/21  Yes McLean-Scocuzza, [name], MD   KLOR-CON M20 20 MEQ tablet TAKE 1 TABLET DAILY 07/14/21  Yes McLean-Scocuzza, [name], MD  Multiple Vitamin (MULTIVITAMIN) tablet Take 1 tablet by mouth daily.   Yes [provider]  naproxen sodium (ALEVE) 220 MG tablet Take 220 mg by mouth daily as needed (pain).   Yes [provider]  Omega-3 Fatty Acids (FISH OIL PO) Take 2 g by mouth daily.   Yes [provider]  pantoprazole (PROTONIX) 20 MG tablet Take 1 tablet (20 mg total) by mouth 2 (two) times daily. 30 min before food 07/02/21  Yes McLean-Scocuzza, [name], MD  polyethylene glycol (MIRALAX / GLYCOLAX) 17 g packet Take 17 g by mouth daily as needed for moderate constipation.   Yes [provider]  pyridoxine (B-6) 200 MG tablet Take 200 mg by mouth daily.   Yes [provider]  rosuvastatin (CRESTOR) 40 MG tablet Take 1 tablet (40 mg total) by mouth daily. At night 07/02/21  Yes McLean-Scocuzza, [name], MD  topiramate (TOPAMAX) 25 MG tablet TAKE 1 TABLET 3 TIMES A DAY 02/12/22  Yes Suzzanne Cloud, NP  traMADol (ULTRAM) 50 MG tablet Take 1-2 tablets (50-100 mg total) by mouth every 12 (twelve) hours as needed. 07/02/21  Yes McLean-Scocuzza, [name], MD  trolamine salicylate (ASPERCREME) 10 % cream Apply 1 application topically as needed for muscle pain.   Yes [provider]  trospium (SANCTURA) 20 MG  tablet Take 1 tablet (20 mg total) by mouth 2 (two) times daily. Prn overactive bladder Patient taking differently: Take 20 mg by mouth 2 (two) times daily. 02/21/21  Yes McLean-Scocuzza, [name], MD  TURMERIC PO Take 538 mg by mouth daily.   Yes [provider]  Vitamin D, Ergocalciferol, (DRISDOL) 1.25 MG (50000 UNIT) CAPS capsule Take 1 capsule (50,000 Units total) by mouth every 7 (seven) days. 02/04/22  Yes Beasley, Caren D, MD  vitamin E 200 UNIT capsule Take 200 Units by mouth daily.   Yes [provider]    Allergies  Allergen Reactions   Aspirin Hives   Coconut  (Cocos Nucifera) Hives   Lisinopril Cough   Metoprolol Itching   Other     Latex paint, the smell causes hives. Not allergic to latex gloves   General anesthesia - has had issues in past where anesthesia was not properly administered and caused side effects   Peanut-Containing Drug Products Hives and Itching   Prednisone     Unknown reaction    Strawberry Extract Hives   Penicillins Rash    Patient Active Problem List   Diagnosis Date Noted   BMI 36.0-36.9,adult 04/08/2022   Obesity, Beginning BMI 44.60 04/08/2022   Persistent cough for 3 weeks or longer 02/13/2022   Type 2 diabetes mellitus with other specified complication ([place]) [id number]   Traumatic tear of supraspinatus tendon of right shoulder 11/24/2021   Right arm pain 11/18/2021   Positive self-administered antigen test for COVID-19 11/09/2021   Stress 10/16/2021   Status post left knee replacement 03/12/2021   Status post total left knee replacement 03/11/2021   Chronic pain 01/06/2021   Lumbar radiculopathy 07/04/2020   Bilateral sciatica 07/04/2020   Pain in both hands 07/04/2020   Hyperlipidemia associated with type 2 diabetes mellitus ([place]) 05/01/2020   Abnormal blood level of copper 03/14/2020   Leukopenia 03/14/2020   Obesity (BMI 30-39.9) 11/13/2019   Chronic pain of right thumb 10/11/2019   Vitamin D deficiency 07/27/2019   Memory loss 04/27/2019   Mass of left wrist [id number]   Periumbilical hernia Q000111Q   Mass of right thigh 09/13/2018   Depression 08/02/2018   Sleeps in sitting position due to orthopnea 07/13/2018   BiPAP (biphasic positive airway pressure) dependence 05/24/2018   Goiter 04/15/2017   Urinary incontinence 04/15/2017   Type 2 diabetes mellitus without complication, without long-term current use of insulin ([place]) 02/15/2017   Bilateral leg cramps 01/05/2017   Peripheral neuropathy 05/27/2016   Elevated sed rate 03/05/2016   Muscle spasm of both lower legs 02/12/2016   Edema of  both ankles 02/12/2016   Iron deficiency anemia 02/12/2016   Hypokalemia 02/12/2016   Pernicious anemia 02/12/2016   Degenerative disc disease, lumbar 04/09/2015   Degenerative arthritis of knee, bilateral 03/13/2015   Chronic low back pain 03/13/2015   Coronary atherosclerosis of native coronary artery 10/31/2013   Incidental lung nodule, > 41m and < 840m Left lower lobe 69m18m June 2014, April 2015 unchanged 06/25/2013   OSA on CPAP 06/21/2013   Myalgia and myositis 05/02/2013   Muscle ache 04/03/2013   Class 3 severe obesity with serious comorbidity and body mass index (BMI) of 40.0 to 44.9 in adult ([place]/25/2014   Large hiatal hernia 08/24/2012    LLL nodule 6mm64mJune 2014, described in 2003 07/13/2012   Asthma, chronic 06/29/2012   Migraine without aura 03/11/2012   SOB (shortness of breath) 12/28/2011   Hx of  fibroids 08/03/2011   Knee pain, left 12/25/2010   NECK PAIN 12/03/2009   TINNITUS 11/22/2007   HEARING LOSS 11/22/2007   Chronic pain of both knees 11/22/2007   Gastroesophageal reflux disease without esophagitis 07/20/2007   NASH (nonalcoholic steatohepatitis) 07/20/2007   ANXIETY 10/22/2006   Allergic rhinitis 10/22/2006   Osteoarthritis 10/22/2006    Past Medical History:  Diagnosis Date   Abdominal hernia    Abnormal Pap smear    ALLERGIC RHINITIS 10/22/2006   Allergy    ANEMIA 12/18/2008   Arthritis    scoliosis moderate deg changes lumbar Xray 11/04/17    Asthma    ASYMPTOMATIC POSTMENOPAUSAL STATUS 11/22/2007   Back pain    CAD (coronary artery disease)    in LAD   CHF (congestive heart failure) ([place])    Complication of anesthesia 2010   pt states she woke up during colonoscopy and was aware of "losing control of breathing" during mammoplasty   Degenerative arthritis    Diverticulosis    Dyslipidemia    Fibroid    Frequent headaches    GERD 07/20/2007   GOITER, MULTINODULAR 07/20/2007   Headache(784.0) 07/20/2007   HEARING LOSS 11/22/2007    Hiatal hernia    Hiatal hernia    large   History of chicken pox    Hx of colposcopy with cervical biopsy    HYPERCHOLESTEROLEMIA 01/13/2007   Hyperglycemia    HYPERTENSION 10/22/2006   Lung nodule    unchanged since 05/26/13    Migraines    NASH (nonalcoholic steatohepatitis)    Nocturnal hypoxemia 02/17/2013   Obesity    Obstructive sleep apnea    OSA on CPAP    per neurology   OSTEOARTHRITIS 10/22/2006   Other chronic nonalcoholic liver disease [id number]   Pre-diabetes    Sedimentation rate elevation    Sleep apnea    on cpap   Urine incontinence    UTI (urinary tract infection)     Past Surgical History:  Procedure Laterality Date   BREAST SURGERY  1984   Breast reduction b/l    CATARACT EXTRACTION, BILATERAL     DEXA  08/2005   DILATION AND CURETTAGE OF UTERUS     ELECTROCARDIOGRAM  10/15/2006   ESOPHAGOGASTRODUODENOSCOPY  12/08/2005   FOOT SURGERY     hammertoe and bunion 05/2017    Stress Cardiolite  10/21/2005   sweat gland removal     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   Procedure: Left TOTAL KNEE ARTHROPLASTY;  Surgeon: Mcarthur Rossetti, MD;  Location: [place] [facility];  Service: Orthopedics;  Laterality: Left;   WISDOM TOOTH EXTRACTION      Social History   Socioeconomic History   Marital status: Married    Spouse name: [name]   Number of children: 2   Years of education: College   Highest education level: Not on file  Occupational History   Occupation: Retired  Tobacco Use   Smoking status: Former    Packs/day: 2.00    Years: 20.00    Total pack years: 40.00    Types: Cigarettes    Quit date: 03/03/1979    Years since quitting: 43.1   Smokeless tobacco: Never  Vaping Use   Vaping Use: Never used  Substance and Sexual Activity   Alcohol use: Yes    Comment: rare - TWICE A YR   Drug use: No   Sexual activity: Yes    Birth control/protection: Surgical  Other Topics Concern   Not on file  Social  History Narrative   Patient is married  [name]).   Right Handed   Drinks 1 cup caffeine   Social Determinants of Health   Financial Resource Strain: Low Risk  (11/27/2020)   Overall Financial Resource Strain (CARDIA)    Difficulty of Paying Living Expenses: Not hard at all  Food Insecurity: No Food Insecurity (10/24/2021)   Hunger Vital Sign    Worried About Running Out of Food in the Last Year: Never true    Ran Out of Food in the Last Year: Never true  Transportation Needs: No Transportation Needs (10/24/2021)   PRAPARE - Hydrologist (Medical): No    Lack of Transportation (Non-Medical): No  Physical Activity: Sufficiently Active (11/24/2018)   Exercise Vital Sign    Days of Exercise per Week: 4 days    Minutes of Exercise per Session: 60 min  Stress: No Stress Concern Present (11/27/2020)   [place]    Feeling of Stress : Not at all  Social Connections: Unknown (11/27/2020)   Social Connection and Isolation Panel [NHANES]    Frequency of Communication with Friends and Family: Not on file    Frequency of Social Gatherings with Friends and Family: Not on file    Attends Religious Services: Not on file    Active Member of Clubs or Organizations: Not on file    Attends Archivist Meetings: Not on file    Marital Status: Married  Intimate Partner Violence: Not At Risk (11/27/2020)   Humiliation, Afraid, Rape, and Kick questionnaire    Fear of Current or Ex-Partner: No    Emotionally Abused: No    Physically Abused: No    Sexually Abused: No    Family History  Problem Relation Age of Onset   Asthma Mother    Depression Mother    Bipolar disorder Mother    Dementia Mother    Arthritis Mother    Breast cancer Mother        primary   Colon cancer Mother        mets from breast   Cancer Mother        colon   Hypertension Father    Migraines Father    Cancer Maternal Aunt        ?   Heart disease Maternal  Grandmother    Cancer Maternal Grandfather        stomach ?   Sleep apnea Neg Hx    Esophageal cancer Neg Hx    Stomach cancer Neg Hx    Rectal cancer Neg Hx      Review of Systems  Constitutional: Negative.  Negative for chills and fever.  HENT: Negative.  Negative for congestion and sore throat.   Respiratory: Negative.  Negative for cough and shortness of breath.   Cardiovascular: Negative.  Negative for chest pain and palpitations.  Gastrointestinal:  Positive for abdominal pain and nausea. Negative for blood in stool, diarrhea, melena and vomiting.  Genitourinary: Negative.  Negative for dysuria and hematuria.  Skin: Negative.  Negative for rash.  Neurological: Negative.  Negative for dizziness and headaches.  All other systems reviewed and are negative.  Today's Vitals   04/23/22 1008  BP: 124/76  Pulse: 88  Temp: 98.2 F (36.8 C)  TempSrc: Oral  SpO2: 94%  Weight: 207 lb (93.9 kg)  Height: 5' 4"$  (1.626 m)   Body mass index is 35.53 kg/m.   Physical Exam  Vitals reviewed.  Constitutional:      Appearance: Normal appearance.  HENT:     Head: Normocephalic.  Eyes:     Extraocular Movements: Extraocular movements intact.     Pupils: Pupils are equal, round, and reactive to light.  Cardiovascular:     Rate and Rhythm: Normal rate and regular rhythm.     Pulses: Normal pulses.     Heart sounds: Normal heart sounds.  Pulmonary:     Effort: Pulmonary effort is normal.     Breath sounds: Normal breath sounds.  Abdominal:     Palpations: Abdomen is soft.     Tenderness: There is no abdominal tenderness. There is no right CVA tenderness or left CVA tenderness.     Hernia: A hernia is present.     Comments: Large ventral hernia, nontender  Musculoskeletal:     Cervical back: No tenderness.  Lymphadenopathy:     Cervical: No cervical adenopathy.  Skin:    General: Skin is warm and dry.  Neurological:     General: No focal deficit present.     Mental Status:  She is alert and oriented to person, place, and time.  Psychiatric:        Mood and Affect: Mood normal.        Behavior: Behavior normal.      ASSESSMENT & PLAN: A total of 42 minutes was spent with the patient and counseling/coordination of care regarding preparing for this visit, review of most recent office visit notes, review of multiple chronic medical conditions under management, review of all medications, differential diagnosis of generalized abdominal pain, ED precautions, need for blood work today, need to stay well-hydrated, education on nutrition, prognosis, documentation, need for follow-up.  Problem List Items Addressed This Visit       Other   Ventral hernia without obstruction or gangrene    Large ventral hernia.  No signs of bowel obstruction. Stable.  Nontender. ED precautions given. Diet and nutrition discussed.      Generalized abdominal pain - Primary    Clinically stable.  Normal vital signs.  Afebrile. Benign abdominal examination. No red flag signs or symptoms. Differential diagnosis discussed. Recommend BRAT diet and to stay well-hydrated. Blood work done today. ED precautions given. Advised to contact the office if no better or worse during the next several days.      Relevant Orders   Lipase   CBC with Differential/Platelet   Comprehensive metabolic panel   Urinalysis   Patient Instructions  Abdominal Pain, Adult Many things can cause belly (abdominal) pain. Most times, belly pain is not dangerous. Many cases of belly pain can be watched and treated at home. Sometimes, though, belly pain is serious. Your doctor will try to find the cause of your belly pain. Follow these instructions at home:  Medicines Take over-the-counter and prescription medicines only as told by your doctor. Do not take medicines that help you poop (laxatives) unless told by your doctor. General instructions Watch your belly pain for any changes. Drink enough fluid to  keep your pee (urine) pale yellow. Keep all follow-up visits as told by your doctor. This is important. Contact a doctor if: Your belly pain changes or gets worse. You are not hungry, or you lose weight without trying. You are having trouble pooping (constipated) or have watery poop (diarrhea) for more than 2-3 days. You have pain when you pee or poop. Your belly pain wakes you up at night. Your pain gets worse with meals,  after eating, or with certain foods. You are vomiting and cannot keep anything down. You have a fever. You have blood in your pee. Get help right away if: Your pain does not go away as soon as your doctor says it should. You cannot stop vomiting. Your pain is only in areas of your belly, such as the right side or the left lower part of the belly. You have bloody or black poop, or poop that looks like tar. You have very bad pain, cramping, or bloating in your belly. You have signs of not having enough fluid or water in your body (dehydration), such as: Dark pee, very little pee, or no pee. Cracked lips. Dry mouth. Sunken eyes. Sleepiness. Weakness. You have trouble breathing or chest pain. Summary Many cases of belly pain can be watched and treated at home. Watch your belly pain for any changes. Take over-the-counter and prescription medicines only as told by your doctor. Contact a doctor if your belly pain changes or gets worse. Get help right away if you have very bad pain, cramping, or bloating in your belly. This information is not intended to replace advice given to you by your health care provider. Make sure you discuss any questions you have with your health care provider. Document Revised: 06/27/2018 Document Reviewed: 06/27/2018 Elsevier Patient Education  [place], MD [facility]

## 2022-04-23 NOTE — Patient Instructions (Signed)
Abdominal Pain, Adult Many things can cause belly (abdominal) pain. Most times, belly pain is not dangerous. Many cases of belly pain can be watched and treated at home. Sometimes, though, belly pain is serious. Your doctor will try to find the cause of your belly pain. Follow these instructions at home:  Medicines Take over-the-counter and prescription medicines only as told by your doctor. Do not take medicines that help you poop (laxatives) unless told by your doctor. General instructions Watch your belly pain for any changes. Drink enough fluid to keep your pee (urine) pale yellow. Keep all follow-up visits as told by your doctor. This is important. Contact a doctor if: Your belly pain changes or gets worse. You are not hungry, or you lose weight without trying. You are having trouble pooping (constipated) or have watery poop (diarrhea) for more than 2-3 days. You have pain when you pee or poop. Your belly pain wakes you up at night. Your pain gets worse with meals, after eating, or with certain foods. You are vomiting and cannot keep anything down. You have a fever. You have blood in your pee. Get help right away if: Your pain does not go away as soon as your doctor says it should. You cannot stop vomiting. Your pain is only in areas of your belly, such as the right side or the left lower part of the belly. You have bloody or black poop, or poop that looks like tar. You have very bad pain, cramping, or bloating in your belly. You have signs of not having enough fluid or water in your body (dehydration), such as: Dark pee, very little pee, or no pee. Cracked lips. Dry mouth. Sunken eyes. Sleepiness. Weakness. You have trouble breathing or chest pain. Summary Many cases of belly pain can be watched and treated at home. Watch your belly pain for any changes. Take over-the-counter and prescription medicines only as told by your doctor. Contact a doctor if your belly pain  changes or gets worse. Get help right away if you have very bad pain, cramping, or bloating in your belly. This information is not intended to replace advice given to you by your health care provider. Make sure you discuss any questions you have with your health care provider. Document Revised: 06/27/2018 Document Reviewed: 06/27/2018 Elsevier Patient Education  2023 [employer] Inc[employer]  

## 2022-04-23 NOTE — Telephone Encounter (Signed)
Called Pt and scheduled to come in today at 10:00am.

## 2022-04-24 ENCOUNTER — Inpatient Hospital Stay (HOSPITAL_COMMUNITY)
Admission: EM | Admit: 2022-04-24 | Discharge: 2022-04-30 | DRG: 398 | Disposition: A | Discharge status: Home Health | Payer: Medicare Other | Attending: Internal Medicine | Admitting: Internal Medicine

## 2022-04-24 ENCOUNTER — Telehealth: Payer: Self-pay | Admitting: Internal Medicine

## 2022-04-24 ENCOUNTER — Emergency Department (HOSPITAL_COMMUNITY): Payer: Medicare Other

## 2022-04-24 DIAGNOSIS — Z9841 Cataract extraction status, right eye: Secondary | ICD-10-CM

## 2022-04-24 DIAGNOSIS — E7849 Other hyperlipidemia: Secondary | ICD-10-CM | POA: Diagnosis not present

## 2022-04-24 DIAGNOSIS — Z79899 Other long term (current) drug therapy: Secondary | ICD-10-CM

## 2022-04-24 DIAGNOSIS — K420 Umbilical hernia with obstruction, without gangrene: Secondary | ICD-10-CM | POA: Diagnosis not present

## 2022-04-24 DIAGNOSIS — Z96652 Presence of left artificial knee joint: Secondary | ICD-10-CM | POA: Diagnosis present

## 2022-04-24 DIAGNOSIS — Z9861 Coronary angioplasty status: Secondary | ICD-10-CM

## 2022-04-24 DIAGNOSIS — E785 Hyperlipidemia, unspecified: Secondary | ICD-10-CM | POA: Insufficient documentation

## 2022-04-24 DIAGNOSIS — Z87891 Personal history of nicotine dependence: Secondary | ICD-10-CM | POA: Diagnosis not present

## 2022-04-24 DIAGNOSIS — J45909 Unspecified asthma, uncomplicated: Secondary | ICD-10-CM | POA: Diagnosis not present

## 2022-04-24 DIAGNOSIS — E871 Hypo-osmolality and hyponatremia: Secondary | ICD-10-CM | POA: Diagnosis present

## 2022-04-24 DIAGNOSIS — Z9102 Food additives allergy status: Secondary | ICD-10-CM

## 2022-04-24 DIAGNOSIS — R1110 Vomiting, unspecified: Secondary | ICD-10-CM | POA: Diagnosis not present

## 2022-04-24 DIAGNOSIS — I5032 Chronic diastolic (congestive) heart failure: Secondary | ICD-10-CM | POA: Diagnosis present

## 2022-04-24 DIAGNOSIS — R0600 Dyspnea, unspecified: Secondary | ICD-10-CM | POA: Diagnosis not present

## 2022-04-24 DIAGNOSIS — I2510 Atherosclerotic heart disease of native coronary artery without angina pectoris: Secondary | ICD-10-CM

## 2022-04-24 DIAGNOSIS — E876 Hypokalemia: Secondary | ICD-10-CM | POA: Diagnosis not present

## 2022-04-24 DIAGNOSIS — Z6836 Body mass index (BMI) 36.0-36.9, adult: Secondary | ICD-10-CM | POA: Diagnosis not present

## 2022-04-24 DIAGNOSIS — R000 Tachycardia, unspecified: Secondary | ICD-10-CM | POA: Diagnosis not present

## 2022-04-24 DIAGNOSIS — K660 Peritoneal adhesions (postprocedural) (postinfection): Secondary | ICD-10-CM | POA: Diagnosis present

## 2022-04-24 DIAGNOSIS — K388 Other specified diseases of appendix: Secondary | ICD-10-CM | POA: Diagnosis not present

## 2022-04-24 DIAGNOSIS — Z91018 Allergy to other foods: Secondary | ICD-10-CM

## 2022-04-24 DIAGNOSIS — Z886 Allergy status to analgesic agent status: Secondary | ICD-10-CM | POA: Diagnosis not present

## 2022-04-24 DIAGNOSIS — N179 Acute kidney failure, unspecified: Secondary | ICD-10-CM | POA: Diagnosis present

## 2022-04-24 DIAGNOSIS — I110 Hypertensive heart disease with heart failure: Secondary | ICD-10-CM | POA: Diagnosis present

## 2022-04-24 DIAGNOSIS — Z7902 Long term (current) use of antithrombotics/antiplatelets: Secondary | ICD-10-CM

## 2022-04-24 DIAGNOSIS — Z825 Family history of asthma and other chronic lower respiratory diseases: Secondary | ICD-10-CM

## 2022-04-24 DIAGNOSIS — I700 Atherosclerosis of aorta: Secondary | ICD-10-CM | POA: Diagnosis not present

## 2022-04-24 DIAGNOSIS — Z8249 Family history of ischemic heart disease and other diseases of the circulatory system: Secondary | ICD-10-CM

## 2022-04-24 DIAGNOSIS — K56609 Unspecified intestinal obstruction, unspecified as to partial versus complete obstruction: Principal | ICD-10-CM | POA: Diagnosis present

## 2022-04-24 DIAGNOSIS — Z880 Allergy status to penicillin: Secondary | ICD-10-CM

## 2022-04-24 DIAGNOSIS — K436 Other and unspecified ventral hernia with obstruction, without gangrene: Secondary | ICD-10-CM | POA: Diagnosis not present

## 2022-04-24 DIAGNOSIS — G4733 Obstructive sleep apnea (adult) (pediatric): Secondary | ICD-10-CM | POA: Diagnosis present

## 2022-04-24 DIAGNOSIS — Z888 Allergy status to other drugs, medicaments and biological substances status: Secondary | ICD-10-CM

## 2022-04-24 DIAGNOSIS — R918 Other nonspecific abnormal finding of lung field: Secondary | ICD-10-CM | POA: Diagnosis not present

## 2022-04-24 DIAGNOSIS — R7303 Prediabetes: Secondary | ICD-10-CM | POA: Diagnosis present

## 2022-04-24 DIAGNOSIS — K449 Diaphragmatic hernia without obstruction or gangrene: Secondary | ICD-10-CM | POA: Diagnosis not present

## 2022-04-24 DIAGNOSIS — Z4682 Encounter for fitting and adjustment of non-vascular catheter: Secondary | ICD-10-CM | POA: Diagnosis not present

## 2022-04-24 DIAGNOSIS — E669 Obesity, unspecified: Secondary | ICD-10-CM | POA: Diagnosis not present

## 2022-04-24 DIAGNOSIS — I509 Heart failure, unspecified: Secondary | ICD-10-CM | POA: Diagnosis not present

## 2022-04-24 DIAGNOSIS — Z9842 Cataract extraction status, left eye: Secondary | ICD-10-CM

## 2022-04-24 DIAGNOSIS — R109 Unspecified abdominal pain: Secondary | ICD-10-CM | POA: Diagnosis not present

## 2022-04-24 DIAGNOSIS — R1084 Generalized abdominal pain: Secondary | ICD-10-CM | POA: Diagnosis not present

## 2022-04-24 LAB — CBC
HCT: 47.7 % — ABNORMAL HIGH (ref 36.0–46.0)
Hemoglobin: 16.1 g/dL — ABNORMAL HIGH (ref 12.0–15.0)
MCH: 31.6 pg (ref 26.0–34.0)
MCHC: 33.8 g/dL (ref 30.0–36.0)
MCV: 93.5 fL (ref 80.0–100.0)
Platelets: 229 10*3/uL (ref 150–400)
RBC: 5.1 MIL/uL (ref 3.87–5.11)
RDW: 14.3 % (ref 11.5–15.5)
WBC: 4.9 10*3/uL (ref 4.0–10.5)
nRBC: 0 % (ref 0.0–0.2)

## 2022-04-24 LAB — URINALYSIS, ROUTINE W REFLEX MICROSCOPIC
Glucose, UA: NEGATIVE mg/dL
Hgb urine dipstick: NEGATIVE
Ketones, ur: NEGATIVE mg/dL
Nitrite: NEGATIVE
Protein, ur: 30 mg/dL — AB
Specific Gravity, Urine: 1.025 (ref 1.005–1.030)
pH: 5 (ref 5.0–8.0)

## 2022-04-24 LAB — LACTIC ACID, PLASMA
Lactic Acid, Venous: 1.4 mmol/L (ref 0.5–1.9)
Lactic Acid, Venous: 1.8 mmol/L (ref 0.5–1.9)

## 2022-04-24 LAB — COMPREHENSIVE METABOLIC PANEL
ALT: 40 U/L (ref 0–44)
AST: 34 U/L (ref 15–41)
Albumin: 3.6 g/dL (ref 3.5–5.0)
Alkaline Phosphatase: 54 U/L (ref 38–126)
Anion gap: 14 (ref 5–15)
BUN: 28 mg/dL — ABNORMAL HIGH (ref 8–23)
CO2: 26 mmol/L (ref 22–32)
Calcium: 9.9 mg/dL (ref 8.9–10.3)
Chloride: 90 mmol/L — ABNORMAL LOW (ref 98–111)
Creatinine, Ser: 1.09 mg/dL — ABNORMAL HIGH (ref 0.44–1.00)
GFR, Estimated: 53 mL/min — ABNORMAL LOW (ref >60)
Glucose, Bld: 139 mg/dL — ABNORMAL HIGH (ref 70–99)
Potassium: 3.6 mmol/L (ref 3.5–5.1)
Sodium: 130 mmol/L — ABNORMAL LOW (ref 135–145)
Total Bilirubin: 0.8 mg/dL (ref 0.3–1.2)
Total Protein: 8.2 g/dL — ABNORMAL HIGH (ref 6.5–8.1)

## 2022-04-24 LAB — CBG MONITORING, ED: Glucose-Capillary: 119 mg/dL — ABNORMAL HIGH (ref 70–99)

## 2022-04-24 LAB — LIPASE, BLOOD: Lipase: 32 U/L (ref 11–51)

## 2022-04-24 MED ORDER — ONDANSETRON HCL 4 MG/2ML IJ SOLN
4.0000 mg | Freq: Once | INTRAMUSCULAR | Status: AC
  Administered 2022-04-24: 4 mg via INTRAVENOUS
  Filled 2022-04-24: qty 2

## 2022-04-24 MED ORDER — MORPHINE SULFATE (PF) 4 MG/ML IV SOLN
4.0000 mg | Freq: Once | INTRAVENOUS | Status: AC
  Administered 2022-04-24: 4 mg via INTRAVENOUS
  Filled 2022-04-24: qty 1

## 2022-04-24 MED ORDER — SODIUM CHLORIDE 0.9 % IV BOLUS
1000.0000 mL | Freq: Once | INTRAVENOUS | Status: AC
  Administered 2022-04-24: 1000 mL via INTRAVENOUS

## 2022-04-24 NOTE — Telephone Encounter (Signed)
Called Heather [name].  She has pain across her lower abdomen.  She has dec appetite.  The pain is constant.  The pain is severe 10/10.   No diarrhea now.  She may be constipated now.     Initially she thought she had GERD.  She had some vomiting.  She had some pain with BM.  That passed after a couple of days but then got generalized pain.    She has not eaten a full meal in one week.     Advised going to the ED tonight - need imaging

## 2022-04-24 NOTE — ED Triage Notes (Signed)
Pt to ED c/o emesis, abdominal pain, and generalized weakness for 3-4 days. Reports evaluated at PCP yesterday for the same.

## 2022-04-24 NOTE — ED Provider Triage Note (Signed)
Emergency Medicine Provider Triage Evaluation Note  Heather [name] , a 77 y.o. female  was evaluated in triage.  Pt complains of vomiting, abdominal pain, generalized weakness for the last 6 days.  Patient reports she was evaluated PCP yesterday for the same.  Lab work performed, no significant abnormalities noted on brief review.  Review of Systems  Positive: Nausea, vomiting, weakness, abdominal pain Negative: Chest pain  Physical Exam  BP (!) 125/58 (BP Location: Right Arm)   Pulse (!) 137   Temp 97.6 F (36.4 C) (Oral)   Resp 20   Ht '5\' 4"'$  (1.626 m)   Wt 93.9 kg   SpO2 96%   BMI 35.53 kg/m  Gen:   Awake, no distress   Resp:  Normal effort  MSK:   Moves extremities without difficulty  Other:  Focal tenderness in epigastric and periumbilical region, she has a pronounced ventral hernia with firmness.  No rebound, rigidity on my exam.  Medical Decision Making  Medically screening exam initiated at 7:18 PM.  Appropriate orders placed.  Heather [name] was informed that the remainder of the evaluation will be completed by another provider, this initial triage assessment does not replace that evaluation, and the importance of remaining in the ED until their evaluation is complete.  Workup initiated in triage    Heather [name] 04/24/22 [id number]

## 2022-04-24 NOTE — Telephone Encounter (Signed)
Pts husband has called and stated pt is still having abdominal pain. He states pt was told about labs and dehydration but nothing was mentioned about it.

## 2022-04-24 NOTE — Telephone Encounter (Signed)
Called Pt and went over lab results.

## 2022-04-24 NOTE — Telephone Encounter (Signed)
Pt called back wants a nurse to go over her lab results with her and also notice the labs are abnormal. Pt also stated she is in severe pain in her abdominal area.    Please give pt a call back [phone]

## 2022-04-24 NOTE — ED Provider Notes (Signed)
[facility] Provider Note   CSN: [id number] Arrival date & time: 04/24/22  1858     History {Add pertinent medical, surgical, social history, OB history to HPI:1} Chief Complaint  Patient presents with   Abdominal Pain   generalized weakness   Emesis    Heather [name] is a 77 y.o. female.  HPI   Patient with medical history including CAD, hypertension, diastolic heart failure grade 1, without significant abdominal surgical history presents with complaints of abdominal pain.  Abdominal pain started on Sunday, states is gotten worse, states that she has been unable to tolerate p.o., states that she has been vomiting, states that she has had minimal bowel movements she states she feels constipated, she has not been urinating much because she has not been eating or drinking.  She states pain has centralized in her umbilical region where her hernia is, states states that her stomach is slightly enlarged.  She denies any fevers chills cough congestion or bodyaches.  She has never had this in the past.    Home Medications Prior to Admission medications   Medication Sig Start Date End Date Taking? Authorizing Provider  acetaminophen (TYLENOL) 500 MG tablet Take 1,000 mg by mouth 3 (three) times daily as needed for moderate pain.    [provider]  Boswellia-Glucosamine-Vit D (OSTEO BI-FLEX ONE PER DAY PO) Take 1 tablet by mouth daily.    [provider]  Calcium Carb-Cholecalciferol (CHEWABLE CALCIUM/D3 PO) Take 1 tablet by mouth daily.    [provider]  cetirizine (ZYRTEC) 10 MG tablet Take 10 mg by mouth daily.    [provider]  clopidogrel (PLAVIX) 75 MG tablet TAKE 1 TABLET DAILY 01/01/22   Larey Dresser, MD  Coenzyme Q10 (CO Q 10 PO) Take 400 mg by mouth.    [provider]  Cyanocobalamin 1000 MCG CAPS Take 1 capsule by mouth daily.    [provider]  ezetimibe (ZETIA) 10 MG  tablet Take 1 tablet (10 mg total) by mouth daily. 09/09/21   Larey Dresser, MD  Ferrous Sulfate (IRON) 325 (65 FE) MG TABS Take 1 tablet by mouth daily.    [provider]  furosemide (LASIX) 40 MG tablet Take 1 tablet (40 mg total) by mouth daily as needed. 07/02/21   McLean-Scocuzza, [name], MD  gabapentin (NEURONTIN) 100 MG capsule TAKE 3 CAPSULES BY MOUTH AT BEDTIME 07/02/21   McLean-Scocuzza, [name], MD  KLOR-CON M20 20 MEQ tablet TAKE 1 TABLET DAILY 07/14/21   McLean-Scocuzza, [name], MD  Multiple Vitamin (MULTIVITAMIN) tablet Take 1 tablet by mouth daily.    [provider]  naproxen sodium (ALEVE) 220 MG tablet Take 220 mg by mouth daily as needed (pain).    [provider]  Omega-3 Fatty Acids (FISH OIL PO) Take 2 g by mouth daily.    [provider]  pantoprazole (PROTONIX) 20 MG tablet Take 1 tablet (20 mg total) by mouth 2 (two) times daily. 30 min before food 07/02/21   McLean-Scocuzza, [name], MD  polyethylene glycol (MIRALAX / GLYCOLAX) 17 g packet Take 17 g by mouth daily as needed for moderate constipation.    [provider]  pyridoxine (B-6) 200 MG tablet Take 200 mg by mouth daily.    [provider]  rosuvastatin (CRESTOR) 40 MG tablet Take 1 tablet (40 mg total) by mouth daily. At night 07/02/21   McLean-Scocuzza, [name], MD  topiramate (TOPAMAX) 25 MG tablet TAKE 1 TABLET 3 TIMES A DAY 02/12/22   Suzzanne Cloud, NP  traMADol (ULTRAM) 50 MG tablet Take 1-2 tablets (50-100 mg total) by mouth every 12 (twelve) hours as needed. 07/02/21   McLean-Scocuzza, [name], MD  trolamine salicylate (ASPERCREME) 10 % cream Apply 1 application topically as needed for muscle pain.    [provider]  trospium (SANCTURA) 20 MG tablet Take 1 tablet (20 mg total) by mouth 2 (two) times daily. Prn overactive bladder Patient taking differently: Take 20 mg by mouth 2 (two) times daily. 02/21/21   McLean-Scocuzza, [name], MD  TURMERIC PO  Take 538 mg by mouth daily.    [provider]  Vitamin D, Ergocalciferol, (DRISDOL) 1.25 MG (50000 UNIT) CAPS capsule Take 1 capsule (50,000 Units total) by mouth every 7 (seven) days. 02/04/22   [name] D, MD  vitamin E 200 UNIT capsule Take 200 Units by mouth daily.    [provider]      Allergies    Aspirin, Coconut (cocos nucifera), Lisinopril, Metoprolol, Other, Peanut-containing drug products, Prednisone, Strawberry extract, and Penicillins    Review of Systems   Review of Systems  Constitutional:  Negative for chills and fever.  Respiratory:  Negative for shortness of breath.   Cardiovascular:  Negative for chest pain.  Gastrointestinal:  Positive for abdominal distention, abdominal pain, constipation and vomiting.  Neurological:  Negative for headaches.    Physical Exam Updated Vital Signs BP 138/87   Pulse (!) 110   Temp 99.3 F (37.4 C) (Oral)   Resp (!) 34   Ht '5\' 4"'$  (1.626 m)   Wt 93.9 kg   SpO2 95%   BMI 35.53 kg/m  Physical Exam Vitals and nursing note reviewed.  Constitutional:      General: She is not in acute distress.    Appearance: She is not ill-appearing.  HENT:     Head: Normocephalic and atraumatic.     Nose: No congestion.  Eyes:     Conjunctiva/sclera: Conjunctivae normal.  Cardiovascular:     Rate and Rhythm: Normal rate and regular rhythm.     Pulses: Normal pulses.     Heart sounds: No murmur heard.    No friction rub. No gallop.  Pulmonary:     Effort: No respiratory distress.     Breath sounds: No wheezing, rhonchi or rales.  Abdominal:     General: There is distension.     Palpations: Abdomen is soft.     Tenderness: There is abdominal tenderness.     Hernia: A hernia is present.     Comments: Abdomen was distended, tympanic to percussion, soft, tender to palpation mainly around the periumbilical region, there is a large ventral hernia present, only able to reduce part of it, no peritoneal sign rebound  tenderness.  Musculoskeletal:     Right lower leg: No edema.     Left lower leg: No edema.  Skin:    General: Skin is warm and dry.  Neurological:     Mental Status: She is alert.  Psychiatric:        Mood and Affect: Mood normal.     ED Results / Procedures / Treatments   Labs (all labs ordered are listed, but only abnormal results are displayed) Labs Reviewed  COMPREHENSIVE METABOLIC PANEL - Abnormal; Notable for the following components:      Result Value   Sodium 130 (*)    Chloride 90 (*)  Glucose, Bld 139 (*)    BUN 28 (*)    Creatinine, Ser 1.09 (*)    Total Protein 8.2 (*)    GFR, Estimated 53 (*)    All other components within normal limits  CBC - Abnormal; Notable for the following components:   Hemoglobin 16.1 (*)    HCT 47.7 (*)    All other components within normal limits  URINALYSIS, ROUTINE W REFLEX MICROSCOPIC - Abnormal; Notable for the following components:   Color, Urine AMBER (*)    APPearance CLOUDY (*)    Bilirubin Urine SMALL (*)    Protein, ur 30 (*)    Leukocytes,Ua SMALL (*)    Bacteria, UA RARE (*)    All other components within normal limits  CBG MONITORING, ED - Abnormal; Notable for the following components:   Glucose-Capillary 119 (*)    All other components within normal limits  LIPASE, BLOOD  LACTIC ACID, PLASMA  LACTIC ACID, PLASMA    EKG None  Radiology No results found.  Procedures Procedures  {Document cardiac monitor, telemetry assessment procedure when appropriate:1}  Medications Ordered in ED Medications  sodium chloride 0.9 % bolus 1,000 mL (has no administration in time range)  morphine (PF) 4 MG/ML injection 4 mg (has no administration in time range)  ondansetron (ZOFRAN) injection 4 mg (has no administration in time range)    ED Course/ Medical Decision Making/ A&P   {   Click here for ABCD2, HEART and other calculatorsREFRESH Note before signing :1}                          Medical Decision  Making Risk Prescription drug management.   This patient presents to the ED for concern of abdominal pain, this involves an extensive number of treatment options, and is a complaint that carries with it a high risk of complications and morbidity.  The differential diagnosis includes bowel obstruction, volvulus, strangulated hernia, diverticulitis,    Additional history obtained:  Additional history obtained from son at bedside External records from outside source obtained and reviewed including cardiology notes   Co morbidities that complicate the patient evaluation  CHF  Social Determinants of Health:  N/A    Lab Tests:  I Ordered, and personally interpreted labs.  The pertinent results include: CBC is unremarkable, CMP shows sodium 130, chloride 90, glucose 139, BUN 29, creatinine 1.09, UA shows small leukocytes rare bacteria, initial lactic is 1.4   Imaging Studies ordered:  I ordered imaging studies including CT AP I independently visualized and interpreted imaging which showed *** I agree with the radiologist interpretation   Cardiac Monitoring:  The patient was maintained on a cardiac monitor.  I personally viewed and interpreted the cardiac monitored which showed an underlying rhythm of: EKG sinus tach without signs of ischemia QT prolongation   Medicines ordered and prescription drug management:  I ordered medication including fluids, pain medication I have reviewed the patients home medicines and have made adjustments as needed  Critical Interventions:  ***   Reevaluation:  Presents with abdominal pain, triage obtain basic lab work and imaging, lab work is consistent with dehydration, on my exam I am concerned for possible incarcerated hernia, will send down for CT imaging, prior with pain medications and reassess.   Consultations Obtained:  I requested consultation with the ***,  and discussed lab and imaging findings as well as pertinent plan -  they recommend: ***    Test Considered:  ***  Rule out ****    Dispostion and problem list  After consideration of the diagnostic results and the patients response to treatment, I feel that the patent would benefit from ***.       {Document critical care time when appropriate:1} {Document review of labs and clinical decision tools ie heart score, Chads2Vasc2 etc:1}  {Document your independent review of radiology images, and any outside records:1} {Document your discussion with family members, caretakers, and with consultants:1} {Document social determinants of health affecting pt's care:1} {Document your decision making why or why not admission, treatments were needed:1} Final Clinical Impression(s) / ED Diagnoses Final diagnoses:  None    Rx / DC Orders ED Discharge Orders     None

## 2022-04-25 ENCOUNTER — Encounter: Payer: Self-pay | Admitting: Internal Medicine

## 2022-04-25 ENCOUNTER — Inpatient Hospital Stay (HOSPITAL_COMMUNITY): Payer: Medicare Other

## 2022-04-25 ENCOUNTER — Inpatient Hospital Stay (HOSPITAL_COMMUNITY): Payer: Medicare Other | Admitting: Critical Care Medicine

## 2022-04-25 ENCOUNTER — Encounter (HOSPITAL_COMMUNITY): Payer: Self-pay | Admitting: Family Medicine

## 2022-04-25 ENCOUNTER — Other Ambulatory Visit: Payer: Self-pay

## 2022-04-25 ENCOUNTER — Encounter (INDEPENDENT_AMBULATORY_CARE_PROVIDER_SITE_OTHER): Payer: Self-pay | Admitting: Family Medicine

## 2022-04-25 ENCOUNTER — Encounter (HOSPITAL_COMMUNITY)
Admission: EM | Disposition: A | Discharge status: Home Health | Payer: Self-pay | Source: Home / Self Care | Attending: Internal Medicine

## 2022-04-25 DIAGNOSIS — K388 Other specified diseases of appendix: Secondary | ICD-10-CM | POA: Diagnosis not present

## 2022-04-25 DIAGNOSIS — N179 Acute kidney failure, unspecified: Secondary | ICD-10-CM | POA: Diagnosis present

## 2022-04-25 DIAGNOSIS — K420 Umbilical hernia with obstruction, without gangrene: Secondary | ICD-10-CM

## 2022-04-25 DIAGNOSIS — G4733 Obstructive sleep apnea (adult) (pediatric): Secondary | ICD-10-CM | POA: Insufficient documentation

## 2022-04-25 DIAGNOSIS — R0600 Dyspnea, unspecified: Secondary | ICD-10-CM | POA: Diagnosis not present

## 2022-04-25 DIAGNOSIS — K660 Peritoneal adhesions (postprocedural) (postinfection): Secondary | ICD-10-CM | POA: Diagnosis present

## 2022-04-25 DIAGNOSIS — Z87891 Personal history of nicotine dependence: Secondary | ICD-10-CM | POA: Diagnosis not present

## 2022-04-25 DIAGNOSIS — E871 Hypo-osmolality and hyponatremia: Secondary | ICD-10-CM | POA: Diagnosis present

## 2022-04-25 DIAGNOSIS — Z4682 Encounter for fitting and adjustment of non-vascular catheter: Secondary | ICD-10-CM | POA: Diagnosis not present

## 2022-04-25 DIAGNOSIS — I2510 Atherosclerotic heart disease of native coronary artery without angina pectoris: Secondary | ICD-10-CM | POA: Diagnosis present

## 2022-04-25 DIAGNOSIS — E669 Obesity, unspecified: Secondary | ICD-10-CM | POA: Diagnosis present

## 2022-04-25 DIAGNOSIS — K56609 Unspecified intestinal obstruction, unspecified as to partial versus complete obstruction: Secondary | ICD-10-CM | POA: Diagnosis present

## 2022-04-25 DIAGNOSIS — Z91018 Allergy to other foods: Secondary | ICD-10-CM | POA: Diagnosis not present

## 2022-04-25 DIAGNOSIS — Z8249 Family history of ischemic heart disease and other diseases of the circulatory system: Secondary | ICD-10-CM | POA: Diagnosis not present

## 2022-04-25 DIAGNOSIS — E7849 Other hyperlipidemia: Secondary | ICD-10-CM

## 2022-04-25 DIAGNOSIS — I700 Atherosclerosis of aorta: Secondary | ICD-10-CM | POA: Diagnosis not present

## 2022-04-25 DIAGNOSIS — R1110 Vomiting, unspecified: Secondary | ICD-10-CM | POA: Diagnosis not present

## 2022-04-25 DIAGNOSIS — Z886 Allergy status to analgesic agent status: Secondary | ICD-10-CM | POA: Diagnosis not present

## 2022-04-25 DIAGNOSIS — E785 Hyperlipidemia, unspecified: Secondary | ICD-10-CM | POA: Insufficient documentation

## 2022-04-25 DIAGNOSIS — R918 Other nonspecific abnormal finding of lung field: Secondary | ICD-10-CM | POA: Diagnosis not present

## 2022-04-25 DIAGNOSIS — K436 Other and unspecified ventral hernia with obstruction, without gangrene: Secondary | ICD-10-CM

## 2022-04-25 DIAGNOSIS — Z7902 Long term (current) use of antithrombotics/antiplatelets: Secondary | ICD-10-CM | POA: Diagnosis not present

## 2022-04-25 DIAGNOSIS — R1084 Generalized abdominal pain: Secondary | ICD-10-CM | POA: Diagnosis not present

## 2022-04-25 DIAGNOSIS — Z888 Allergy status to other drugs, medicaments and biological substances status: Secondary | ICD-10-CM | POA: Diagnosis not present

## 2022-04-25 DIAGNOSIS — I5032 Chronic diastolic (congestive) heart failure: Secondary | ICD-10-CM | POA: Diagnosis present

## 2022-04-25 DIAGNOSIS — Z9861 Coronary angioplasty status: Secondary | ICD-10-CM | POA: Diagnosis not present

## 2022-04-25 DIAGNOSIS — I509 Heart failure, unspecified: Secondary | ICD-10-CM | POA: Diagnosis not present

## 2022-04-25 DIAGNOSIS — J45909 Unspecified asthma, uncomplicated: Secondary | ICD-10-CM | POA: Diagnosis present

## 2022-04-25 DIAGNOSIS — R7303 Prediabetes: Secondary | ICD-10-CM | POA: Diagnosis present

## 2022-04-25 DIAGNOSIS — Z79899 Other long term (current) drug therapy: Secondary | ICD-10-CM | POA: Diagnosis not present

## 2022-04-25 DIAGNOSIS — K449 Diaphragmatic hernia without obstruction or gangrene: Secondary | ICD-10-CM | POA: Diagnosis not present

## 2022-04-25 DIAGNOSIS — I110 Hypertensive heart disease with heart failure: Secondary | ICD-10-CM | POA: Diagnosis present

## 2022-04-25 DIAGNOSIS — E876 Hypokalemia: Secondary | ICD-10-CM | POA: Diagnosis present

## 2022-04-25 DIAGNOSIS — R109 Unspecified abdominal pain: Secondary | ICD-10-CM | POA: Diagnosis not present

## 2022-04-25 DIAGNOSIS — Z96652 Presence of left artificial knee joint: Secondary | ICD-10-CM | POA: Diagnosis present

## 2022-04-25 DIAGNOSIS — Z6836 Body mass index (BMI) 36.0-36.9, adult: Secondary | ICD-10-CM | POA: Diagnosis not present

## 2022-04-25 DIAGNOSIS — Z9981 Dependence on supplemental oxygen: Secondary | ICD-10-CM | POA: Insufficient documentation

## 2022-04-25 HISTORY — PX: UMBILICAL HERNIA REPAIR: SHX196

## 2022-04-25 LAB — CBC WITH DIFFERENTIAL/PLATELET
Abs Immature Granulocytes: 0.02 10*3/uL (ref 0.00–0.07)
Basophils Absolute: 0 10*3/uL (ref 0.0–0.1)
Basophils Relative: 0 %
Eosinophils Absolute: 0 10*3/uL (ref 0.0–0.5)
Eosinophils Relative: 0 %
HCT: 49.4 % — ABNORMAL HIGH (ref 36.0–46.0)
Hemoglobin: 16.6 g/dL — ABNORMAL HIGH (ref 12.0–15.0)
Immature Granulocytes: 0 %
Lymphocytes Relative: 13 %
Lymphs Abs: 0.6 10*3/uL — ABNORMAL LOW (ref 0.7–4.0)
MCH: 32 pg (ref 26.0–34.0)
MCHC: 33.6 g/dL (ref 30.0–36.0)
MCV: 95.4 fL (ref 80.0–100.0)
Monocytes Absolute: 0.8 10*3/uL (ref 0.1–1.0)
Monocytes Relative: 19 %
Neutro Abs: 3 10*3/uL (ref 1.7–7.7)
Neutrophils Relative %: 68 %
Platelets: 216 10*3/uL (ref 150–400)
RBC: 5.18 MIL/uL — ABNORMAL HIGH (ref 3.87–5.11)
RDW: 14.6 % (ref 11.5–15.5)
WBC: 4.5 10*3/uL (ref 4.0–10.5)
nRBC: 0 % (ref 0.0–0.2)

## 2022-04-25 LAB — MAGNESIUM: Magnesium: 2.3 mg/dL (ref 1.7–2.4)

## 2022-04-25 LAB — BASIC METABOLIC PANEL
Anion gap: 18 — ABNORMAL HIGH (ref 5–15)
BUN: 32 mg/dL — ABNORMAL HIGH (ref 8–23)
CO2: 25 mmol/L (ref 22–32)
Calcium: 9.9 mg/dL (ref 8.9–10.3)
Chloride: 87 mmol/L — ABNORMAL LOW (ref 98–111)
Creatinine, Ser: 1.18 mg/dL — ABNORMAL HIGH (ref 0.44–1.00)
GFR, Estimated: 48 mL/min — ABNORMAL LOW (ref >60)
Glucose, Bld: 87 mg/dL (ref 70–99)
Potassium: 3.6 mmol/L (ref 3.5–5.1)
Sodium: 130 mmol/L — ABNORMAL LOW (ref 135–145)

## 2022-04-25 LAB — LACTIC ACID, PLASMA
Lactic Acid, Venous: 1.2 mmol/L (ref 0.5–1.9)
Lactic Acid, Venous: 1 mmol/L (ref 0.5–1.9)

## 2022-04-25 SURGERY — REPAIR, HERNIA, UMBILICAL, ADULT
Anesthesia: General | Site: Abdomen

## 2022-04-25 MED ORDER — CEFAZOLIN SODIUM-DEXTROSE 2-3 GM-%(50ML) IV SOLR
INTRAVENOUS | Status: DC | PRN
  Administered 2022-04-25: 2 g via INTRAVENOUS

## 2022-04-25 MED ORDER — SUCCINYLCHOLINE CHLORIDE 200 MG/10ML IV SOSY
PREFILLED_SYRINGE | INTRAVENOUS | Status: DC | PRN
  Administered 2022-04-25: 100 mg via INTRAVENOUS

## 2022-04-25 MED ORDER — FENTANYL CITRATE PF 50 MCG/ML IJ SOSY
50.0000 ug | PREFILLED_SYRINGE | Freq: Once | INTRAMUSCULAR | Status: AC
  Administered 2022-04-25: 50 ug via INTRAVENOUS
  Filled 2022-04-25: qty 1

## 2022-04-25 MED ORDER — SUGAMMADEX SODIUM 200 MG/2ML IV SOLN
INTRAVENOUS | Status: DC | PRN
  Administered 2022-04-25: 200 mg via INTRAVENOUS

## 2022-04-25 MED ORDER — FENTANYL CITRATE (PF) 100 MCG/2ML IJ SOLN
25.0000 ug | INTRAMUSCULAR | Status: DC | PRN
  Administered 2022-04-25: 25 ug via INTRAVENOUS

## 2022-04-25 MED ORDER — ONDANSETRON HCL 4 MG/2ML IJ SOLN
INTRAMUSCULAR | Status: DC | PRN
  Administered 2022-04-25: 4 mg via INTRAVENOUS

## 2022-04-25 MED ORDER — FENTANYL CITRATE (PF) 250 MCG/5ML IJ SOLN
INTRAMUSCULAR | Status: DC | PRN
  Administered 2022-04-25 (×2): 50 ug via INTRAVENOUS

## 2022-04-25 MED ORDER — 0.9 % SODIUM CHLORIDE (POUR BTL) OPTIME
TOPICAL | Status: DC | PRN
  Administered 2022-04-25: 1000 mL

## 2022-04-25 MED ORDER — MORPHINE SULFATE (PF) 2 MG/ML IV SOLN
2.0000 mg | INTRAVENOUS | Status: DC | PRN
  Administered 2022-04-25 – 2022-04-26 (×3): 2 mg via INTRAVENOUS
  Filled 2022-04-25 (×3): qty 1

## 2022-04-25 MED ORDER — POTASSIUM CHLORIDE IN NACL 20-0.9 MEQ/L-% IV SOLN
INTRAVENOUS | Status: DC
  Filled 2022-04-25 (×3): qty 1000

## 2022-04-25 MED ORDER — PROPOFOL 10 MG/ML IV BOLUS
INTRAVENOUS | Status: AC
  Filled 2022-04-25: qty 20

## 2022-04-25 MED ORDER — LIDOCAINE 2% (20 MG/ML) 5 ML SYRINGE
INTRAMUSCULAR | Status: DC | PRN
  Administered 2022-04-25: 100 mg via INTRAVENOUS

## 2022-04-25 MED ORDER — FENTANYL CITRATE (PF) 100 MCG/2ML IJ SOLN
INTRAMUSCULAR | Status: AC
  Filled 2022-04-25: qty 2

## 2022-04-25 MED ORDER — DILTIAZEM HCL 25 MG/5ML IV SOLN: 5.0000 mg | INTRAVENOUS | Status: DC | PRN

## 2022-04-25 MED ORDER — BUPIVACAINE HCL (PF) 0.25 % IJ SOLN
INTRAMUSCULAR | Status: AC
  Filled 2022-04-25: qty 30

## 2022-04-25 MED ORDER — MORPHINE SULFATE (PF) 2 MG/ML IV SOLN: 2.0000 mg | INTRAVENOUS | Status: DC | PRN

## 2022-04-25 MED ORDER — ENOXAPARIN SODIUM 40 MG/0.4ML IJ SOSY
40.0000 mg | PREFILLED_SYRINGE | Freq: Every day | INTRAMUSCULAR | Status: DC
  Administered 2022-04-25 – 2022-04-30 (×6): 40 mg via SUBCUTANEOUS
  Filled 2022-04-25 (×6): qty 0.4

## 2022-04-25 MED ORDER — IOHEXOL 350 MG/ML SOLN
75.0000 mL | Freq: Once | INTRAVENOUS | Status: AC | PRN
  Administered 2022-04-25: 75 mL via INTRAVENOUS

## 2022-04-25 MED ORDER — ROCURONIUM BROMIDE 10 MG/ML (PF) SYRINGE
PREFILLED_SYRINGE | INTRAVENOUS | Status: DC | PRN
  Administered 2022-04-25: 50 mg via INTRAVENOUS

## 2022-04-25 MED ORDER — FENTANYL CITRATE (PF) 250 MCG/5ML IJ SOLN
INTRAMUSCULAR | Status: AC
  Filled 2022-04-25: qty 5

## 2022-04-25 MED ORDER — BUPIVACAINE HCL (PF) 0.25 % IJ SOLN
INTRAMUSCULAR | Status: DC | PRN
  Administered 2022-04-25: 30 mL

## 2022-04-25 MED ORDER — DEXAMETHASONE SODIUM PHOSPHATE 10 MG/ML IJ SOLN
INTRAMUSCULAR | Status: DC | PRN
  Administered 2022-04-25: 4 mg via INTRAVENOUS

## 2022-04-25 MED ORDER — CHLORHEXIDINE GLUCONATE 0.12 % MT SOLN
OROMUCOSAL | Status: AC
  Administered 2022-04-25: 15 mL via OROMUCOSAL
  Filled 2022-04-25: qty 15

## 2022-04-25 MED ORDER — PROPOFOL 10 MG/ML IV BOLUS
INTRAVENOUS | Status: DC | PRN
  Administered 2022-04-25: 50 mg via INTRAVENOUS
  Administered 2022-04-25: 150 mg via INTRAVENOUS

## 2022-04-25 MED ORDER — ORAL CARE MOUTH RINSE: 15.0000 mL | Freq: Once | OROMUCOSAL | Status: AC

## 2022-04-25 MED ORDER — ACETAMINOPHEN 650 MG RE SUPP: 650.0000 mg | Freq: Four times a day (QID) | RECTAL | Status: DC | PRN

## 2022-04-25 MED ORDER — ALBUTEROL SULFATE (2.5 MG/3ML) 0.083% IN NEBU
2.5000 mg | INHALATION_SOLUTION | Freq: Once | RESPIRATORY_TRACT | Status: AC
  Administered 2022-04-25: 2.5 mg via RESPIRATORY_TRACT

## 2022-04-25 MED ORDER — PHENYLEPHRINE 80 MCG/ML (10ML) SYRINGE FOR IV PUSH (FOR BLOOD PRESSURE SUPPORT)
PREFILLED_SYRINGE | INTRAVENOUS | Status: DC | PRN
  Administered 2022-04-25: 160 ug via INTRAVENOUS
  Administered 2022-04-25 (×2): 80 ug via INTRAVENOUS
  Administered 2022-04-25: 240 ug via INTRAVENOUS

## 2022-04-25 MED ORDER — ONDANSETRON HCL 4 MG/2ML IJ SOLN
4.0000 mg | Freq: Four times a day (QID) | INTRAMUSCULAR | Status: DC | PRN
  Administered 2022-04-25: 4 mg via INTRAVENOUS
  Filled 2022-04-25: qty 2

## 2022-04-25 MED ORDER — LACTATED RINGERS IV SOLN: INTRAVENOUS | Status: DC

## 2022-04-25 MED ORDER — CHLORHEXIDINE GLUCONATE 0.12 % MT SOLN: 15.0000 mL | Freq: Once | OROMUCOSAL | Status: AC

## 2022-04-25 MED ORDER — MORPHINE SULFATE (PF) 2 MG/ML IV SOLN: 1.0000 mg | INTRAVENOUS | Status: DC | PRN

## 2022-04-25 MED ORDER — PANTOPRAZOLE SODIUM 40 MG IV SOLR
40.0000 mg | Freq: Every day | INTRAVENOUS | Status: DC
  Administered 2022-04-25 – 2022-04-29 (×5): 40 mg via INTRAVENOUS
  Filled 2022-04-25 (×5): qty 10

## 2022-04-25 MED ORDER — ONDANSETRON HCL 4 MG/2ML IJ SOLN: 4.0000 mg | Freq: Once | INTRAMUSCULAR | Status: DC | PRN

## 2022-04-25 MED ORDER — LACTATED RINGERS IV BOLUS
1000.0000 mL | Freq: Once | INTRAVENOUS | Status: AC
  Administered 2022-04-25: 1000 mL via INTRAVENOUS

## 2022-04-25 MED ORDER — ACETAMINOPHEN 325 MG PO TABS: 650.0000 mg | ORAL_TABLET | Freq: Four times a day (QID) | ORAL | Status: DC | PRN

## 2022-04-25 SURGICAL SUPPLY — 37 items
ADH SKN CLS APL DERMABOND .7 (GAUZE/BANDAGES/DRESSINGS) ×1
APL PRP STRL LF DISP 70% ISPRP (MISCELLANEOUS) ×1
BAG COUNTER SPONGE SURGICOUNT (BAG) IMPLANT
BAG SPNG CNTER NS LX DISP (BAG)
BLADE CLIPPER SURG (BLADE) IMPLANT
CANISTER SUCT 3000ML PPV (MISCELLANEOUS) IMPLANT
CHLORAPREP W/TINT 26 (MISCELLANEOUS) ×1 IMPLANT
COVER SURGICAL LIGHT HANDLE (MISCELLANEOUS) ×1 IMPLANT
DERMABOND ADVANCED .7 DNX12 (GAUZE/BANDAGES/DRESSINGS) ×1 IMPLANT
DRAPE LAPAROTOMY 100X72 PEDS (DRAPES) ×1 IMPLANT
ELECT REM PT RETURN 9FT ADLT (ELECTROSURGICAL) ×1
ELECTRODE REM PT RTRN 9FT ADLT (ELECTROSURGICAL) ×1 IMPLANT
GLOVE SRG 8 PF TXTR STRL LF DI (GLOVE) ×1 IMPLANT
GLOVE SURG ENC MOIS LTX SZ7.5 (GLOVE) ×1 IMPLANT
GLOVE SURG UNDER POLY LF SZ8 (GLOVE) ×1
GOWN STRL REUS W/ TWL LRG LVL3 (GOWN DISPOSABLE) ×1 IMPLANT
GOWN STRL REUS W/ TWL XL LVL3 (GOWN DISPOSABLE) ×2 IMPLANT
GOWN STRL REUS W/TWL LRG LVL3 (GOWN DISPOSABLE) ×1
GOWN STRL REUS W/TWL XL LVL3 (GOWN DISPOSABLE) ×1
KIT BASIN OR (CUSTOM PROCEDURE TRAY) ×2 IMPLANT
KIT TURNOVER KIT B (KITS) ×1 IMPLANT
NDL 22X1.5 STRL (OR ONLY) (MISCELLANEOUS) ×1 IMPLANT
NEEDLE 22X1.5 STRL (OR ONLY) (MISCELLANEOUS) ×1 IMPLANT
NS IRRIG 1000ML POUR BTL (IV SOLUTION) ×1 IMPLANT
PACK GENERAL/GYN (CUSTOM PROCEDURE TRAY) ×1 IMPLANT
PAD ARMBOARD 7.5X6 YLW CONV (MISCELLANEOUS) ×2 IMPLANT
PENCIL SMOKE EVACUATOR (MISCELLANEOUS) ×2 IMPLANT
SPIKE FLUID TRANSFER (MISCELLANEOUS) ×1 IMPLANT
SUT MON AB 4-0 PC3 18 (SUTURE) ×1 IMPLANT
SUT NOVA NAB GS-21 0 18 T12 DT (SUTURE) IMPLANT
SUT PDS AB 2-0 CT2 27 (SUTURE) ×2 IMPLANT
SUT VIC AB 2-0 SH 18 (SUTURE) IMPLANT
SUT VIC AB 3-0 CT1 27 (SUTURE) ×1
SUT VIC AB 3-0 CT1 TAPERPNT 27 (SUTURE) ×1 IMPLANT
SYR CONTROL 10ML LL (SYRINGE) ×2 IMPLANT
TOWEL GREEN STERILE (TOWEL DISPOSABLE) ×1 IMPLANT
TOWEL GREEN STERILE FF (TOWEL DISPOSABLE) ×2 IMPLANT

## 2022-04-25 NOTE — Transfer of Care (Signed)
Immediate Anesthesia Transfer of Care Note  Patient: Heather [name]  Procedure(s) Performed: OPEN INCARCERATED HERNIA REPAIR UMBILICAL WITH APPENDECTOMY (Abdomen)  Patient Location: PACU  Anesthesia Type:General  Level of Consciousness: awake and alert   Airway & Oxygen Therapy: Patient Spontanous Breathing and Patient connected to nasal cannula oxygen  Post-op Assessment: Report given to RN and Post -op Vital signs reviewed and stable  Post vital signs: Reviewed and stable  Last Vitals:  Vitals Value Taken Time  BP 122/86 04/25/22 1426  Temp    Pulse 114   Resp 30 04/25/22 1430  SpO2 94   Vitals shown include unvalidated device data.  Last Pain:  Vitals:   04/25/22 1149  TempSrc: Oral  PainSc:          Complications: No notable events documented.

## 2022-04-25 NOTE — Consult Note (Signed)
Consulting Physician: Nickola Major Chay Mazzoni  Referring Provider: Dr. Claria Dice - Family Medicine  Chief Complaint: Abdominal pain  Reason for Consult: Bowel obstruction secondary to umbilical hernia   Subjective   HPI: Heather [name] is an 77 y.o. female who is here for abdominal pain.  She has noticed an umbilical hernia for years.  She is also noted about a large hiatal hernia for years.  She intermittently has some left abdominal pain.  That she is developed severe abdominal pain mostly around her umbilicus over the last few days.  She has had any nausea or vomiting.  Past Medical History:  Diagnosis Date   Abdominal hernia    Abnormal Pap smear    ALLERGIC RHINITIS 10/22/2006   Allergy    ANEMIA 12/18/2008   Arthritis    scoliosis moderate deg changes lumbar Xray 11/04/17    Asthma    ASYMPTOMATIC POSTMENOPAUSAL STATUS 11/22/2007   Back pain    CAD (coronary artery disease)    in LAD   CHF (congestive heart failure) ([place])    Complication of anesthesia 2010   pt states she woke up during colonoscopy and was aware of "losing control of breathing" during mammoplasty   Degenerative arthritis    Diverticulosis    Dyslipidemia    Fibroid    Frequent headaches    GERD 07/20/2007   GOITER, MULTINODULAR 07/20/2007   Headache(784.0) 07/20/2007   HEARING LOSS 11/22/2007   Hiatal hernia    Hiatal hernia    large   History of chicken pox    Hx of colposcopy with cervical biopsy    HYPERCHOLESTEROLEMIA 01/13/2007   Hyperglycemia    HYPERTENSION 10/22/2006   Lung nodule    unchanged since 05/26/13    Migraines    NASH (nonalcoholic steatohepatitis)    Nocturnal hypoxemia 02/17/2013   Obesity    Obstructive sleep apnea    OSA on CPAP    per neurology   OSTEOARTHRITIS 10/22/2006   Other chronic nonalcoholic liver disease [id number]   Pre-diabetes    Sedimentation rate elevation    Sleep apnea    on cpap   Urine incontinence    UTI (urinary tract infection)      Past Surgical History:  Procedure Laterality Date   BREAST SURGERY  1984   Breast reduction b/l    CATARACT EXTRACTION, BILATERAL     DEXA  08/2005   DILATION AND CURETTAGE OF UTERUS     ELECTROCARDIOGRAM  10/15/2006   ESOPHAGOGASTRODUODENOSCOPY  12/08/2005   FOOT SURGERY     hammertoe and bunion 05/2017    Stress Cardiolite  10/21/2005   sweat gland removal     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   Procedure: Left TOTAL KNEE ARTHROPLASTY;  Surgeon: Mcarthur Rossetti, MD;  Location: [facility];  Service: Orthopedics;  Laterality: Left;   WISDOM TOOTH EXTRACTION      Family History  Problem Relation Age of Onset   Asthma Mother    Depression Mother    Bipolar disorder Mother    Dementia Mother    Arthritis Mother    Breast cancer Mother        primary   Colon cancer Mother        mets from breast   Cancer Mother        colon   Hypertension Father    Migraines Father    Cancer Maternal Aunt        ?   Heart disease Maternal Grandmother  Cancer Maternal Grandfather        stomach ?   Sleep apnea Neg Hx    Esophageal cancer Neg Hx    Stomach cancer Neg Hx    Rectal cancer Neg Hx     Social:  reports that she quit smoking about 43 years ago. Her smoking use included cigarettes. She has a 40.00 pack-year smoking history. She has never used smokeless tobacco. She reports current alcohol use. She reports that she does not use drugs.  Allergies:  Allergies  Allergen Reactions   Aspirin Hives   Coconut (Cocos Nucifera) Hives   Lisinopril Cough   Metoprolol Itching   Other     Latex paint, the smell causes hives. Not allergic to latex gloves   General anesthesia - has had issues in past where anesthesia was not properly administered and caused side effects   Peanut-Containing Drug Products Hives and Itching   Prednisone     Unknown reaction    Strawberry Extract Hives   Penicillins Rash    Medications: Current Outpatient Medications  Medication  Instructions   acetaminophen (TYLENOL) 1,000 mg, Oral, 3 times daily PRN   Boswellia-Glucosamine-Vit D (OSTEO BI-FLEX ONE PER DAY PO) 1 tablet, Oral, Daily   Calcium Carb-Cholecalciferol (CHEWABLE CALCIUM/D3 PO) 1 tablet, Oral, Daily   cetirizine (ZYRTEC) 10 mg, Oral, Daily   clopidogrel (PLAVIX) 75 MG tablet TAKE 1 TABLET DAILY   Coenzyme Q10 (CO Q 10 PO) 400 mg, Oral   Cyanocobalamin 1000 MCG CAPS 1 capsule, Oral, Daily   ezetimibe (ZETIA) 10 mg, Oral, Daily   Ferrous Sulfate (IRON) 325 (65 FE) MG TABS 1 tablet, Oral, Daily,     furosemide (LASIX) 40 mg, Oral, Daily PRN   gabapentin (NEURONTIN) 100 MG capsule TAKE 3 CAPSULES BY MOUTH AT BEDTIME   KLOR-CON M20 20 MEQ tablet TAKE 1 TABLET DAILY   Multiple Vitamin (MULTIVITAMIN) tablet 1 tablet, Oral, Daily,     naproxen sodium (ALEVE) 220 mg, Oral, Daily PRN   Omega-3 Fatty Acids (FISH OIL PO) 2 g, Oral, Daily   pantoprazole (PROTONIX) 20 mg, Oral, 2 times daily, 30 min before food   polyethylene glycol (MIRALAX / GLYCOLAX) 17 g, Oral, Daily PRN   pyridoxine (B-6) 200 mg, Oral, Daily   rosuvastatin (CRESTOR) 40 mg, Oral, Daily, At night   topiramate (TOPAMAX) 25 mg, Oral, 3 times daily   traMADol (ULTRAM) 50-100 mg, Oral, Every 12 hours PRN   trolamine salicylate (ASPERCREME) 10 % cream 1 application , Topical, As needed   trospium (SANCTURA) 20 mg, Oral, 2 times daily, Prn overactive bladder   TURMERIC PO 538 mg, Oral, Daily   Vitamin D (Ergocalciferol) (DRISDOL) 50,000 Units, Oral, Every 7 days   vitamin E 200 Units, Oral, Daily    ROS - all of the below systems have been reviewed with the patient and positives are indicated with bold text General: chills, fever or night sweats Eyes: blurry vision or double vision ENT: epistaxis or sore throat Allergy/Immunology: itchy/watery eyes or nasal congestion Hematologic/Lymphatic: bleeding problems, blood clots or swollen lymph nodes Endocrine: temperature intolerance or unexpected  weight changes Breast: new or changing breast lumps or nipple discharge Resp: cough, shortness of breath, or wheezing CV: chest pain or dyspnea on exertion GI: as per HPI GU: dysuria, trouble voiding, or hematuria MSK: joint pain or joint stiffness Neuro: TIA or stroke symptoms Derm: pruritus and skin lesion changes Psych: anxiety and depression  Objective   PE Blood pressure 95/62, pulse  98, temperature 98.4 F (36.9 C), resp. rate 20, height '5\' 4"'$  (1.626 m), weight 93.9 kg, SpO2 100 %. Constitutional: NAD; conversant; no deformities Eyes: Moist conjunctiva; no lid lag; anicteric; PERRL Neck: Trachea midline; no thyromegaly Lungs: Normal respiratory effort; no tactile fremitus CV: RRR; no palpable thrills; no pitting edema GI: Abd palpable incarcerated tender umbilical hernia; no palpable hepatosplenomegaly MSK: Normal range of motion of extremities; no clubbing/cyanosis Psychiatric: Appropriate affect; alert and oriented x3 Lymphatic: No palpable cervical or axillary lymphadenopathy  Results for orders placed or performed during the hospital encounter of 04/24/22 (from the past 24 hour(s))  CBG monitoring, ED     Status: Abnormal   Collection Time: 04/24/22  7:14 PM  Result Value Ref Range   Glucose-Capillary 119 (H) 70 - 99 mg/dL  Lipase, blood     Status: None   Collection Time: 04/24/22  7:24 PM  Result Value Ref Range   Lipase 32 11 - 51 U/L  Comprehensive metabolic panel     Status: Abnormal   Collection Time: 04/24/22  7:24 PM  Result Value Ref Range   Sodium 130 (L) 135 - 145 mmol/L   Potassium 3.6 3.5 - 5.1 mmol/L   Chloride 90 (L) 98 - 111 mmol/L   CO2 26 22 - 32 mmol/L   Glucose, Bld 139 (H) 70 - 99 mg/dL   BUN 28 (H) 8 - 23 mg/dL   Creatinine, Ser 1.09 (H) 0.44 - 1.00 mg/dL   Calcium 9.9 8.9 - 10.3 mg/dL   Total Protein 8.2 (H) 6.5 - 8.1 g/dL   Albumin 3.6 3.5 - 5.0 g/dL   AST 34 15 - 41 U/L   ALT 40 0 - 44 U/L   Alkaline Phosphatase 54 38 - 126 U/L    Total Bilirubin 0.8 0.3 - 1.2 mg/dL   GFR, Estimated 53 (L) >60 mL/min   Anion gap 14 5 - 15  CBC     Status: Abnormal   Collection Time: 04/24/22  7:24 PM  Result Value Ref Range   WBC 4.9 4.0 - 10.5 K/uL   RBC 5.10 3.87 - 5.11 MIL/uL   Hemoglobin 16.1 (H) 12.0 - 15.0 g/dL   HCT 47.7 (H) 36.0 - 46.0 %   MCV 93.5 80.0 - 100.0 fL   MCH 31.6 26.0 - 34.0 pg   MCHC 33.8 30.0 - 36.0 g/dL   RDW 14.3 11.5 - 15.5 %   Platelets 229 150 - 400 K/uL   nRBC 0.0 0.0 - 0.2 %  Lactic acid, plasma     Status: None   Collection Time: 04/24/22  7:24 PM  Result Value Ref Range   Lactic Acid, Venous 1.4 0.5 - 1.9 mmol/L  Urinalysis, Routine w reflex microscopic -Urine, Clean Catch     Status: Abnormal   Collection Time: 04/24/22  7:45 PM  Result Value Ref Range   Color, Urine AMBER (A) YELLOW   APPearance CLOUDY (A) CLEAR   Specific Gravity, Urine 1.025 1.005 - 1.030   pH 5.0 5.0 - 8.0   Glucose, UA NEGATIVE NEGATIVE mg/dL   Hgb urine dipstick NEGATIVE NEGATIVE   Bilirubin Urine SMALL (A) NEGATIVE   Ketones, ur NEGATIVE NEGATIVE mg/dL   Protein, ur 30 (A) NEGATIVE mg/dL   Nitrite NEGATIVE NEGATIVE   Leukocytes,Ua SMALL (A) NEGATIVE   RBC / HPF 0-5 0 - 5 RBC/hpf   WBC, UA 6-10 0 - 5 WBC/hpf   Bacteria, UA RARE (A) NONE SEEN   Squamous Epithelial /  HPF 6-10 0 - 5 /HPF   Mucus PRESENT    Hyaline Casts, UA PRESENT   Lactic acid, plasma     Status: None   Collection Time: 04/24/22 10:50 PM  Result Value Ref Range   Lactic Acid, Venous 1.8 0.5 - 1.9 mmol/L  Basic metabolic panel     Status: Abnormal   Collection Time: 04/25/22  4:48 AM  Result Value Ref Range   Sodium 130 (L) 135 - 145 mmol/L   Potassium 3.6 3.5 - 5.1 mmol/L   Chloride 87 (L) 98 - 111 mmol/L   CO2 25 22 - 32 mmol/L   Glucose, Bld 87 70 - 99 mg/dL   BUN 32 (H) 8 - 23 mg/dL   Creatinine, Ser 1.18 (H) 0.44 - 1.00 mg/dL   Calcium 9.9 8.9 - 10.3 mg/dL   GFR, Estimated 48 (L) >60 mL/min   Anion gap 18 (H) 5 - 15  CBC with  Differential/Platelet     Status: Abnormal   Collection Time: 04/25/22  4:48 AM  Result Value Ref Range   WBC 4.5 4.0 - 10.5 K/uL   RBC 5.18 (H) 3.87 - 5.11 MIL/uL   Hemoglobin 16.6 (H) 12.0 - 15.0 g/dL   HCT 49.4 (H) 36.0 - 46.0 %   MCV 95.4 80.0 - 100.0 fL   MCH 32.0 26.0 - 34.0 pg   MCHC 33.6 30.0 - 36.0 g/dL   RDW 14.6 11.5 - 15.5 %   Platelets 216 150 - 400 K/uL   nRBC 0.0 0.0 - 0.2 %   Neutrophils Relative % 68 %   Neutro Abs 3.0 1.7 - 7.7 K/uL   Lymphocytes Relative 13 %   Lymphs Abs 0.6 (L) 0.7 - 4.0 K/uL   Monocytes Relative 19 %   Monocytes Absolute 0.8 0.1 - 1.0 K/uL   Eosinophils Relative 0 %   Eosinophils Absolute 0.0 0.0 - 0.5 K/uL   Basophils Relative 0 %   Basophils Absolute 0.0 0.0 - 0.1 K/uL   Immature Granulocytes 0 %   Abs Immature Granulocytes 0.02 0.00 - 0.07 K/uL  Magnesium     Status: None   Collection Time: 04/25/22  4:48 AM  Result Value Ref Range   Magnesium 2.3 1.7 - 2.4 mg/dL  Lactic acid, plasma     Status: None   Collection Time: 04/25/22  7:35 AM  Result Value Ref Range   Lactic Acid, Venous 1.2 0.5 - 1.9 mmol/L   *Note: Due to a large number of results and/or encounters for the requested time period, some results have not been displayed. A complete set of results can be found in Results Review.     Imaging Orders         CT ABDOMEN PELVIS W CONTRAST         DG Abdomen 1 View      Assessment and Plan   REGANN BENSING is an 77 y.o. female with an incarcerated umbilical hernia.  Notably she has a massive hiatal hernia, however this does not appear to be the point of obstruction on CT imaging.  The hernia feels partially reducible, but I am unable to fully reduce at the bedside as the patient does not tolerate this well.  I wonder if she has not been able to vomit due to her massive hiatal hernia.  Conservative management could be considered, however I am worried about insufficient nasogastric tube decompression and progression to vomiting  with a high aspiration risk.  I  feel the safest plan of action would be to proceed with open umbilical hernia repair today.  I discussed the procedure itself as well as its risk, benefits and alternatives with the patient as well as the patient's family over the phone.  We discussed how the hiatal hernia would not be addressed during this operation as it does not appear to be the acute issue that brought her into the emergency room.  After full discussion all questions answered the patient and the patient's family voiced understanding and consent to proceed.  Will proceed to the operating room urgently today.    ICD-10-CM   1. SBO (small bowel obstruction) ([place])  K56.609     2. Ventral hernia with obstruction and without gangrene  K43.6        Felicie Morn, MD  [facility] Use [email or web address] to contact on call provider  New Patient Billing: [phone] - High MDM

## 2022-04-25 NOTE — Anesthesia Postprocedure Evaluation (Signed)
Anesthesia Post Note  Patient: Heather [name]  Procedure(s) Performed: OPEN INCARCERATED HERNIA REPAIR UMBILICAL WITH APPENDECTOMY (Abdomen)     Patient location during evaluation: PACU Anesthesia Type: General Level of consciousness: awake and alert Pain management: pain level controlled Vital Signs Assessment: post-procedure vital signs reviewed and stable Respiratory status: spontaneous breathing, nonlabored ventilation, respiratory function stable and patient connected to nasal cannula oxygen Cardiovascular status: blood pressure returned to baseline, stable and tachycardic Postop Assessment: no apparent nausea or vomiting Anesthetic complications: no   No notable events documented.  Last Vitals:  Vitals:   04/25/22 1600 04/25/22 1614  BP:  126/62  Pulse: (!) 107 (!) 107  Resp: (!) 30 16  Temp:  37.7 C  SpO2: 99% 95%    Last Pain:  Vitals:   04/25/22 1641  TempSrc:   PainSc: [name]

## 2022-04-25 NOTE — Progress Notes (Signed)
Placed patient on CPAP for the night with pressure set at 8cm and oxygen set at 4lpm.

## 2022-04-25 NOTE — H&P (Signed)
PCP:   [name], MD   Chief Complaint:  Abdominal pain  HPI: This is a 77 year old female with past medical history of hypertension, obstructive sleep apnea on CPAP, prediabetes, hyperlipidemia, CAD and NASH.  Per patient her last full meal was Saturday night.  Sunday she developed generalized abdominal pain that has become progressively worse.  Initially her abdominal pain improved, just rebound and has been continuous since.  She endorses nausea and vomiting for the first 3 days, nothing since.  She has not eaten she has not had a BM.  Her abdomen was distended however she thought it was related to her hernia.  She has not eaten or drink since Wednesday.  She saw her PCP on Thursday, blood work was done.  She has not been to the ER because she kept thinking she would get better.  She felt acutely ill today.  In the ER CT abdomen pelvis shows distal SBO with a single point of transition within an umbilical hernia sac just proximal to the ileocecal valve. Proximally, the small bowel is diffusely dilated and fluid-filled Patient's lactic acid has been normal 1.4 => 1.8, with a slight increase. Surgeon Dr. Berlinda Last has been consulted by EDP  Review of Systems:  The patient denies anorexia, fever, weight loss, vision loss, decreased hearing, hoarseness, chest pain, syncope, dyspnea on exertion, peripheral edema, balance deficits, hemoptysis, melena, hematochezia, severe indigestion/heartburn, hematuria, incontinence, genital sores, muscle weakness, suspicious skin lesions, transient blindness, difficulty walking, depression, unusual weight change, abnormal bleeding, enlarged lymph nodes, angioedema, and breast masses. Positive: Abdominal pain, with abdominal distention, nausea, vomiting, anorexia, constipation  Past Medical History: Past Medical History:  Diagnosis Date   Abdominal hernia    Abnormal Pap smear    ALLERGIC RHINITIS 10/22/2006   Allergy    ANEMIA 12/18/2008   Arthritis     scoliosis moderate deg changes lumbar Xray 11/04/17    Asthma    ASYMPTOMATIC POSTMENOPAUSAL STATUS 11/22/2007   Back pain    CAD (coronary artery disease)    in LAD   CHF (congestive heart failure) ([place])    Complication of anesthesia 2010   pt states she woke up during colonoscopy and was aware of "losing control of breathing" during mammoplasty   Degenerative arthritis    Diverticulosis    Dyslipidemia    Fibroid    Frequent headaches    GERD 07/20/2007   GOITER, MULTINODULAR 07/20/2007   Headache(784.0) 07/20/2007   HEARING LOSS 11/22/2007   Hiatal hernia    Hiatal hernia    large   History of chicken pox    Hx of colposcopy with cervical biopsy    HYPERCHOLESTEROLEMIA 01/13/2007   Hyperglycemia    HYPERTENSION 10/22/2006   Lung nodule    unchanged since 05/26/13    Migraines    NASH (nonalcoholic steatohepatitis)    Nocturnal hypoxemia 02/17/2013   Obesity    Obstructive sleep apnea    OSA on CPAP    per neurology   OSTEOARTHRITIS 10/22/2006   Other chronic nonalcoholic liver disease 123456   Pre-diabetes    Sedimentation rate elevation    Sleep apnea    on cpap   Urine incontinence    UTI (urinary tract infection)    Past Surgical History:  Procedure Laterality Date   BREAST SURGERY  1984   Breast reduction b/l    CATARACT EXTRACTION, BILATERAL     DEXA  08/2005   DILATION AND CURETTAGE OF UTERUS  ELECTROCARDIOGRAM  10/15/2006   ESOPHAGOGASTRODUODENOSCOPY  12/08/2005   FOOT SURGERY     hammertoe and bunion 05/2017    Stress Cardiolite  10/21/2005   sweat gland removal     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   Procedure: Left TOTAL KNEE ARTHROPLASTY;  Surgeon: Mcarthur Rossetti, MD;  Location: [facility] [facility];  Service: Orthopedics;  Laterality: Left;   WISDOM TOOTH EXTRACTION      Medications: Prior to Admission medications   Medication Sig Start Date End Date Taking? Authorizing Provider  acetaminophen (TYLENOL) 500 MG tablet Take 1,000 mg by  mouth 3 (three) times daily as needed for moderate pain.    [provider]  Boswellia-Glucosamine-Vit D (OSTEO BI-FLEX ONE PER DAY PO) Take 1 tablet by mouth daily.    [provider]  Calcium Carb-Cholecalciferol (CHEWABLE CALCIUM/D3 PO) Take 1 tablet by mouth daily.    [provider]  cetirizine (ZYRTEC) 10 MG tablet Take 10 mg by mouth daily.    [provider]  clopidogrel (PLAVIX) 75 MG tablet TAKE 1 TABLET DAILY 01/01/22   Larey Dresser, MD  Coenzyme Q10 (CO Q 10 PO) Take 400 mg by mouth.    [provider]  Cyanocobalamin 1000 MCG CAPS Take 1 capsule by mouth daily.    [provider]  ezetimibe (ZETIA) 10 MG tablet Take 1 tablet (10 mg total) by mouth daily. 09/09/21   Larey Dresser, MD  Ferrous Sulfate (IRON) 325 (65 FE) MG TABS Take 1 tablet by mouth daily.    [provider]  furosemide (LASIX) 40 MG tablet Take 1 tablet (40 mg total) by mouth daily as needed. 07/02/21   McLean-Scocuzza, [name], MD  gabapentin (NEURONTIN) 100 MG capsule TAKE 3 CAPSULES BY MOUTH AT BEDTIME 07/02/21   McLean-Scocuzza, [name], MD  KLOR-CON M20 20 MEQ tablet TAKE 1 TABLET DAILY 07/14/21   McLean-Scocuzza, [name], MD  Multiple Vitamin (MULTIVITAMIN) tablet Take 1 tablet by mouth daily.    [provider]  naproxen sodium (ALEVE) 220 MG tablet Take 220 mg by mouth daily as needed (pain).    [provider]  Omega-3 Fatty Acids (FISH OIL PO) Take 2 g by mouth daily.    [provider]  pantoprazole (PROTONIX) 20 MG tablet Take 1 tablet (20 mg total) by mouth 2 (two) times daily. 30 min before food 07/02/21   McLean-Scocuzza, [name], MD  polyethylene glycol (MIRALAX / GLYCOLAX) 17 g packet Take 17 g by mouth daily as needed for moderate constipation.    [provider]  pyridoxine (B-6) 200 MG tablet Take 200 mg by mouth daily.    [provider]  rosuvastatin (CRESTOR) 40 MG tablet Take 1 tablet (40 mg  total) by mouth daily. At night 07/02/21   McLean-Scocuzza, [name], MD  topiramate (TOPAMAX) 25 MG tablet TAKE 1 TABLET 3 TIMES A DAY 02/12/22   Suzzanne Cloud, NP  traMADol (ULTRAM) 50 MG tablet Take 1-2 tablets (50-100 mg total) by mouth every 12 (twelve) hours as needed. 07/02/21   McLean-Scocuzza, [name], MD  trolamine salicylate (ASPERCREME) 10 % cream Apply 1 application topically as needed for muscle pain.    [provider]  trospium (SANCTURA) 20 MG tablet Take 1 tablet (20 mg total) by mouth 2 (two) times daily. Prn overactive bladder Patient taking differently: Take 20 mg by mouth 2 (two) times daily. 02/21/21   McLean-Scocuzza, [name], MD  TURMERIC PO Take 538  mg by mouth daily.    [provider]  Vitamin D, Ergocalciferol, (DRISDOL) 1.25 MG (50000 UNIT) CAPS capsule Take 1 capsule (50,000 Units total) by mouth every 7 (seven) days. 02/04/22   [name] D, MD  vitamin E 200 UNIT capsule Take 200 Units by mouth daily.    [provider]    Allergies:   Allergies  Allergen Reactions   Aspirin Hives   Coconut (Cocos Nucifera) Hives   Lisinopril Cough   Metoprolol Itching   Other     Latex paint, the smell causes hives. Not allergic to latex gloves   General anesthesia - has had issues in past where anesthesia was not properly administered and caused side effects   Peanut-Containing Drug Products Hives and Itching   Prednisone     Unknown reaction    Strawberry Extract Hives   Penicillins Rash    Social History:  reports that she quit smoking about 43 years ago. Her smoking use included cigarettes. She has a 40.00 pack-year smoking history. She has never used smokeless tobacco. She reports current alcohol use. She reports that she does not use drugs.  Family History: Family History  Problem Relation Age of Onset   Asthma Mother    Depression Mother    Bipolar disorder Mother    Dementia Mother    Arthritis Mother    Breast cancer Mother         primary   Colon cancer Mother        mets from breast   Cancer Mother        colon   Hypertension Father    Migraines Father    Cancer Maternal Aunt        ?   Heart disease Maternal Grandmother    Cancer Maternal Grandfather        stomach ?   Sleep apnea Neg Hx    Esophageal cancer Neg Hx    Stomach cancer Neg Hx    Rectal cancer Neg Hx     Physical Exam: Vitals:   04/25/22 0230 04/25/22 0300 04/25/22 0315 04/25/22 0330  BP: 109/73 115/68 135/88   Pulse:      Resp: (!) 24 (!) 23 20   Temp:      TempSrc:      SpO2: 99% 99% 98% 98%  Weight:      Height:        General:  Alert and oriented times three, well developed and nourished, in acute distress Eyes: PERRLA, pink conjunctiva, no scleral icterus ENT: very dry oral mucosa, neck supple, no thyromegaly Lungs: clear to ascultation, no wheeze, no crackles, no use of accessory muscles Cardiovascular: tachycardia, regular rate and rhythm, no regurgitation, no gallops, no murmurs. No carotid bruits, no JVD Abdomen: soft, no BS, distended, tympanic, TTP generalized   GU: not examined Neuro: CN II - XII grossly intact, sensation intact Musculoskeletal: strength 5/5 all extremities, no clubbing, cyanosis or edema Skin: no rash, no subcutaneous crepitation, no decubitus Psych: Alert  & oriented x 3, weak female   Labs on Admission:  Recent Labs    04/23/22 1050 04/24/22 1924  NA 132* 130*  K 3.6 3.6  CL 91* 90*  CO2 31 26  GLUCOSE 120* 139*  BUN 23 28*  CREATININE 0.85 1.09*  CALCIUM 10.5 9.9   Recent Labs    04/23/22 1050 04/24/22 1924  AST 27 34  ALT 25 40  ALKPHOS 55 54  BILITOT 0.8 0.8  PROT 8.4* 8.2*  ALBUMIN 4.3 3.6   Recent Labs    04/23/22 1050 04/24/22 1924  LIPASE 5.0* 32   Recent Labs    04/23/22 1050 04/24/22 1924  WBC 5.7 4.9  NEUTROABS 3.8  --   HGB 15.6* 16.1*  HCT 45.8 47.7*  MCV 94.1 93.5  PLT 230.0 229    Radiological Exams on Admission: CT ABDOMEN PELVIS W  CONTRAST  Result Date: 04/25/2022 CLINICAL DATA:  Abdominal pain, acute, nonlocalized.  Vomiting. EXAM: CT ABDOMEN AND PELVIS WITH CONTRAST TECHNIQUE: Multidetector CT imaging of the abdomen and pelvis was performed using the standard protocol following bolus administration of intravenous contrast. RADIATION DOSE REDUCTION: This exam was performed according to the departmental dose-optimization program which includes automated exposure control, adjustment of the mA and/or kV according to patient size and/or use of iterative reconstruction technique. CONTRAST:  34m OMNIPAQUE IOHEXOL 350 MG/ML SOLN COMPARISON:  CT chest 01/14/2018 FINDINGS: Lower chest: Large hiatal hernia is again identified containing the entire stomach, the majority of the transverse colon, and several loops of dilated, fluid-filled small bowel. Right basilar atelectasis. No pleural effusion. Mild mass effect upon the base of the heart by the large hiatal hernia, increased since prior examination. Hepatobiliary: No focal liver abnormality is seen. No gallstones, gallbladder wall thickening, or biliary dilatation. Pancreas: Unremarkable Spleen: Unremarkable Adrenals/Urinary Tract: Adrenal glands are unremarkable. Kidneys are normal, without renal calculi, focal lesion, or hydronephrosis. Bladder is unremarkable. Stomach/Bowel: A distal small bowel obstruction is present with a single point of transition identified within an bili cul hernia sac just proximal to the ileocecal valve seen on axial image # 60/3. Proximally, the small bowel is diffusely dilated and fluid-filled. The distal tip of the cecum has also herniated into the umbilical hernia and the appendix is fluid-filled, dilated, and hyperemic, best seen on axial image # 68/3. This may relate to obstruction involving the distal tip of the cecum and appendix, however, superimposed appendicitis within the umbilical hernia sac would appear similarly. As noted previously, several loops of  small bowel and large bowel herniate into the large hiatal hernia into the thoracic compartment and there is marked narrowing of several loops of large and small bowel as they extend into the hernia sac, however, these do not appear to result in focal obstruction by themselves. No free intraperitoneal gas or fluid. Superimposed moderate to severe descending and sigmoid colonic diverticulosis without superimposed acute inflammatory change in this location. Vascular/Lymphatic: Aortic atherosclerosis. No enlarged abdominal or pelvic lymph nodes. Reproductive: Heterogeneous enhancement of the uterus relates to multiple underlying uterine fibroids. No adnexal masses are seen. Other: None Musculoskeletal: Degenerative changes are seen within the lumbar spine. No acute bone abnormality. No lytic or blastic bone lesion. IMPRESSION: 1. Distal small bowel obstruction with a single point of transition within an umbilical hernia sac just proximal to the ileocecal valve. Proximally, the small bowel is diffusely dilated and fluid-filled. 2. The distal tip of the cecum and appendix has also herniated into the umbilical hernia and the appendix is fluid-filled, dilated, and hyperemic. This may relate to obstruction involving the distal tip of the cecum and appendix, however, superimposed appendicitis within the umbilical hernia sac would appear similarly. 3. Large hiatal hernia containing the entire stomach, the majority of the transverse colon, and several loops of fluid-filled dilated small bowel. Narrowing of several loops of bowel at the hiatal orifice is noted without evidence of obstruction. 4. Moderate to severe distal colonic diverticulosis without superimposed acute inflammatory change.  5. Fibroid uterus. 6. Aortic atherosclerosis. Aortic Atherosclerosis (ICD10-I70.0). Electronically Signed   By: Fidela Salisbury M.D.   On: 04/25/2022 00:51    Assessment/Plan Present on Admission:  SBO/tachycardia -N.p.o., IV fluid  hydration -IV fluid bolus, repeat lactic acid ordered, repeat LA though normal was increased -PRN pain medications., oxygen as needed -NG tube to low intermittent wall suction -Surgery consult Dr. Lavone Neri consulted and aware -IV Protonix   Hyponatremia -Repleting IV fluids.  BMP in a.m.   Essential hypertension -N.p.o., as needed Lopressor   Hyperlipidemia -Crestor, Zetia on hold   OSA on CPAP  Nocturnal oxygen use -CPAP ordered   CAD -Plavix, Lasix on hold   NASH  Prediabetes ([place]) -Sliding scale  Tayt Moyers 04/25/2022, 3:59 AM

## 2022-04-25 NOTE — Progress Notes (Signed)
[facility]                                                                              Heather [name], is a 77 y.o. female, DOB - 11-16-1945, [id number] Admit date - 04/24/2022    Outpatient Primary MD for the patient is Quay Burow, [name], MD  LOS - 0  days  Chief Complaint  Patient presents with   Abdominal Pain   generalized weakness   Emesis       Brief summary   Patient is a 77 year old female with HTN, OSA on CPAP, prediabetes, HLP, CAD, NASH presented with abdominal pain.  Per patient she had a full meal a week ago on Saturday, 2/17 night.  Next day on Sunday she developed generalized abdominal pain that progressively became worse.  Initially it slightly improved however has been continuous since then with nausea and vomiting for the first 3 days.  No BM for the last 2 to 3 days.  Her abdomen was distended. In ED, CT abdomen pelvis noted distal SBO with single point of transition within the umbilical hernia sac just proximal to the ileocecal valve.  Approximately small bowel is diffusely dilated and fluid-filled. Surgery was consulted and patient was admitted for further workup.    Assessment & Plan    Principal Problem:   SBO (small bowel obstruction) (HCC) with a ventral hernia with obstruction -Presented with abdominal pain, nausea and vomiting, poor p.o. intake, incarcerated umbilical hernia -placed on n.p.o. status with IV fluids, pain control and NGT to intermittent wall suction -General surgery consulted, appreciate recommendations, recommending urgent OR for incarcerated umbilical hernia repair today -Continue IV Protonix  Active Problems: Hiatal hernia -Continue IV PPI     CAD S/P percutaneous coronary angioplasty -Plavix, Lasix on hold, currently no acute chest pain.    Hyperlipidemia -Continue to hold p.o. meds, Crestor, Zetia on hold  Essential hypertension -BP currently soft    Hyponatremia -Likely due to #1, continue IV fluid  hydration  Mild acute kidney injury -Likely due to #1, poor p.o. intake, nausea and vomiting in the last 1 week -Creatinine 1.09 on admission, baseline 0.8, trended up to 1.1 -Continue IV fluid hydration  ObesityBMI 36.0-36.9,adult Estimated body mass index is 35.53 kg/m as calculated from the following:   Height as of this encounter: '5\' 4"'$  (1.626 m).   Weight as of this encounter: 93.9 kg.  Code Status: Full code DVT Prophylaxis:  enoxaparin (LOVENOX) injection 40 mg Start: 04/25/22 1000   Level of Care: Level of care: Telemetry Medical Family Communication: Updated patient Disposition Plan:      Remains inpatient appropriate: Plan for urgent OR today   Procedures:  None  Consultants: Surgery   Antimicrobials: None  Medications  enoxaparin (LOVENOX) injection  40 mg Subcutaneous Daily   pantoprazole (PROTONIX) IV  40 mg Intravenous Daily      Subjective:   Heather [name] was seen and examined today.  NGT with bilious output.  No acute chest pain or shortness of breath.  Objective:   Vitals:   04/25/22 0645 04/25/22 0730 04/25/22 0800 04/25/22 0958  BP: 111/60 115/65 (!) 105/56  95/62  Pulse: (!) 105 (!) 104 (!) 101 98  Resp: (!) 26 (!) 37 18 20  Temp:    98.4 F (36.9 C)  TempSrc:      SpO2: 90% 92% 98% 100%  Weight:      Height:        Intake/Output Summary (Last 24 hours) at 04/25/2022 1048 Last data filed at 04/25/2022 1006 Gross per 24 hour  Intake 1957.33 ml  Output --  Net 1957.33 ml     Wt Readings from Last 3 Encounters:  04/24/22 93.9 kg  04/23/22 93.9 kg  04/14/22 97.1 kg     Exam General: Alert and oriented x 3, NAD, NGT to IWS Cardiovascular: S1 S2 auscultated,  RRR Respiratory: Clear to auscultation bilaterally, no wheezing, rales or rhonchi Gastrointestinal: Distended, incarcerated tender umbilical hernia, dec BS Ext: no pedal edema bilaterally Neuro: no new deficits Psych: Normal affect     Data Reviewed:  I have  personally reviewed following labs    CBC Lab Results  Component Value Date   WBC 4.5 04/25/2022   RBC 5.18 (H) 04/25/2022   HGB 16.6 (H) 04/25/2022   HCT 49.4 (H) 04/25/2022   MCV 95.4 04/25/2022   MCH 32.0 04/25/2022   PLT 216 04/25/2022   MCHC 33.6 04/25/2022   RDW 14.6 04/25/2022   LYMPHSABS 0.6 (L) 04/25/2022   MONOABS 0.8 04/25/2022   EOSABS 0.0 04/25/2022   BASOSABS 0.0 [id number]     Last metabolic panel Lab Results  Component Value Date   NA 130 (L) 04/25/2022   K 3.6 04/25/2022   CL 87 (L) 04/25/2022   CO2 25 04/25/2022   BUN 32 (H) 04/25/2022   CREATININE 1.18 (H) 04/25/2022   GLUCOSE 87 04/25/2022   GFRNONAA 48 (L) 04/25/2022   GFRAA 100 10/09/2019   CALCIUM 9.9 04/25/2022   PROT 8.2 (H) 04/24/2022   ALBUMIN 3.6 04/24/2022   LABGLOB 3.4 04/14/2021   AGRATIO 1.1 (L) 04/14/2021   BILITOT 0.8 04/24/2022   ALKPHOS 54 04/24/2022   AST 34 04/24/2022   ALT 40 04/24/2022   ANIONGAP 18 (H) 04/25/2022    CBG (last 3)  Recent Labs    04/24/22 1914  GLUCAP 119*      Coagulation Profile: No results for input(s): "INR", "PROTIME" in the last 168 hours.   Radiology Studies: I have personally reviewed the imaging studies  DG Abdomen 1 View  Result Date: 04/25/2022 CLINICAL DATA:  77 year old female status post nasogastric tube placement. EXAM: ABDOMEN - 1 VIEW COMPARISON:  CT the abdomen and pelvis 04/24/2022. FINDINGS: Unusual positioning of the nasogastric tube whose tip projects over the lower right hemithorax, presumably within the patient's large hiatal hernia, likely in the periphery of the stomach based on comparison with prior CT examination. IMPRESSION: 1. Tip of nasogastric tube projects over the lower right hemithorax, presumably within the stomach based on comparison with prior CT examination. Electronically Signed   By: Vinnie Langton M.D.   On: 04/25/2022 05:17   CT ABDOMEN PELVIS W CONTRAST  Result Date: 04/25/2022 CLINICAL DATA:   Abdominal pain, acute, nonlocalized.  Vomiting. EXAM: CT ABDOMEN AND PELVIS WITH CONTRAST TECHNIQUE: Multidetector CT imaging of the abdomen and pelvis was performed using the standard protocol following bolus administration of intravenous contrast. RADIATION DOSE REDUCTION: This exam was performed according to the departmental dose-optimization program which includes automated exposure control, adjustment of the mA and/or kV according to patient size and/or use of iterative reconstruction technique. CONTRAST:  50m OMNIPAQUE IOHEXOL 350 MG/ML SOLN COMPARISON:  CT chest 01/14/2018 FINDINGS: Lower chest: Large hiatal hernia is again identified containing the entire stomach, the majority of the transverse colon, and several loops of dilated, fluid-filled small bowel. Right basilar atelectasis. No pleural effusion. Mild mass effect upon the base of the heart by the large hiatal hernia, increased since prior examination. Hepatobiliary: No focal liver abnormality is seen. No gallstones, gallbladder wall thickening, or biliary dilatation. Pancreas: Unremarkable Spleen: Unremarkable Adrenals/Urinary Tract: Adrenal glands are unremarkable. Kidneys are normal, without renal calculi, focal lesion, or hydronephrosis. Bladder is unremarkable. Stomach/Bowel: A distal small bowel obstruction is present with a single point of transition identified within an bili cul hernia sac just proximal to the ileocecal valve seen on axial image # 60/3. Proximally, the small bowel is diffusely dilated and fluid-filled. The distal tip of the cecum has also herniated into the umbilical hernia and the appendix is fluid-filled, dilated, and hyperemic, best seen on axial image # 68/3. This may relate to obstruction involving the distal tip of the cecum and appendix, however, superimposed appendicitis within the umbilical hernia sac would appear similarly. As noted previously, several loops of small bowel and large bowel herniate into the large  hiatal hernia into the thoracic compartment and there is marked narrowing of several loops of large and small bowel as they extend into the hernia sac, however, these do not appear to result in focal obstruction by themselves. No free intraperitoneal gas or fluid. Superimposed moderate to severe descending and sigmoid colonic diverticulosis without superimposed acute inflammatory change in this location. Vascular/Lymphatic: Aortic atherosclerosis. No enlarged abdominal or pelvic lymph nodes. Reproductive: Heterogeneous enhancement of the uterus relates to multiple underlying uterine fibroids. No adnexal masses are seen. Other: None Musculoskeletal: Degenerative changes are seen within the lumbar spine. No acute bone abnormality. No lytic or blastic bone lesion. IMPRESSION: 1. Distal small bowel obstruction with a single point of transition within an umbilical hernia sac just proximal to the ileocecal valve. Proximally, the small bowel is diffusely dilated and fluid-filled. 2. The distal tip of the cecum and appendix has also herniated into the umbilical hernia and the appendix is fluid-filled, dilated, and hyperemic. This may relate to obstruction involving the distal tip of the cecum and appendix, however, superimposed appendicitis within the umbilical hernia sac would appear similarly. 3. Large hiatal hernia containing the entire stomach, the majority of the transverse colon, and several loops of fluid-filled dilated small bowel. Narrowing of several loops of bowel at the hiatal orifice is noted without evidence of obstruction. 4. Moderate to severe distal colonic diverticulosis without superimposed acute inflammatory change. 5. Fibroid uterus. 6. Aortic atherosclerosis. Aortic Atherosclerosis (ICD10-I70.0). Electronically Signed   By: AFidela SalisburyM.D.   On: 04/25/2022 00:51       Ethaniel Garfield M.D. [facility] 04/25/2022, 10:48 AM  Available via Epic secure chat 7am-7pm After 7 pm, please refer  to night coverage provider listed on amion.

## 2022-04-25 NOTE — Op Note (Signed)
Patient: Heather [name] ([birth year], [id number])  Date of Surgery: 04/25/22  Preoperative Diagnosis: Incarcerated umbilical hernia (hernia defect measures 4.7 cm tall x 3.1 cm wide on preoperative CT imaging)  Postoperative Diagnosis: Incarcerated umbilical hernia with strangulated necrotic appendix  Surgical Procedure: Open primary incarcerated umbilical hernia repair and appendectomy  Operative Team Members:  Surgeon(s) and Role:    * Mesha Schamberger, Nickola Major, MD - Primary   Anesthesiologist: Santa Lighter, MD CRNA: Wilburn Cornelia, CRNA   Anesthesia: General   Fluids:  Total I/O In: 958.3 [IV [id number] Out: -   Complications: None  Drains:  none   Specimen:  ID Type Source Tests Collected by Time Destination  1 : appendix GI Appendix SURGICAL PATHOLOGY Alaura Schippers, Nickola Major, MD 04/25/2022 1318      Disposition:  PACU - hemodynamically stable.  Plan of Care: Admit to inpatient     Indications for Procedure: Heather [name] is a 77 y.o. female who presented abdominal pain.  On CT and exam, she was found to have a painful incarcerated umbilical hernia.  Notably she has a large hiatal hernia involving colon, small bowel, and the majority of the stomach, however this did not appear to be the cause of her abdominal pain on presentation..  I recommended open incarcerated umbilical hernia repair.  We discussed the procedure itself as well as its risk, benefits, and alternatives.  After full discussion all questions answered the patient granted consent to proceed.  Findings: Incarcerated umbilical hernia with strangulation of the appendix and perforation of the appendix.     Description of Procedure:   On the date stated above the patient was taken the operating room suite and placed in supine position.  General endotracheal anesthesia was induced.  A timeout was completed verifying the correct patient, procedure, position, and equipment needed for the case.  The  patient's abdomen was prepped and draped in usual sterile fashion.  I made a midline laparotomy incision over the umbilical hernia defect and the hernia was immediately encountered.  I carefully dissected the hernia free from the surrounding subcutaneous tissues and entered the hernia sac.  There was some thin fluid that drained from the sac on initial entry into the hernia sac.  The sac was opened and the contents inspected.  The cecum and terminal ileum were herniating through the hernia.  There was a palpable hernia at the base the umbilicus just inferior to this larger hernia defect.  The fascial bridge between the 2 defects was divided and then I was able to lyse the adhesions between the hernia contents and the abdominal wall and deliver the cecum and terminal ileum out of the abdomen.  The hernia defect measurements preop coordinated with intra-operative findings.  The viscera were inspected.  The appendix was necrotic and had perforated from what appeared to be chronic strangulation in this hernia defect.  I decided proceed with an appendectomy.  A window was made in the mesoappendix at the base of the appendix near the cecum.  A Kelly clamp was used to clamp the base the appendix where it attaches to the cecum.  A second Kelly clamp was used to clamp the mesoappendix.  The appendix was divided at its base with scissors and the mesoappendix was divided using electrocautery.  A 2-0 Vicryl suture was used to ligate the base the appendix and a 2-0 Vicryl suture was used to ligate the mesoappendix.  The appendiceal stump was imbricated into the cecum using two 2-0  Vicryl sutures.  The terminal ileum was inspected and appeared patent.  There was a hematoma on the distal small intestine about 15 cm from the terminal ileum.  This appeared viable, but I did imbricate this using three 2-0 Vicryl sutures.  At this point, the bowel was returned to the abdomen and the hernia defects were closed horizontally using  interrupted figure-of-eight 0 Novafil suture.  The wound was irrigated.  The subcutaneous tissues were reapproximated using 3-0 Vicryl suture.  The skin was closed using 4-0 Monocryl and Dermabond.  All sponge and needle counts were correct at the end of this case.  At the end of the case we reviewed the infection status of the case. Patient: Heather [name] [facility] Patient Case: Emergent Infection Present At Time Of Surgery (PATOS):  perforation of the appendix at the hernia defect fascial level involving the subcutaneous space  and intra-peritoneal space.  Heather Raw, MD General, Bariatric, & Minimally Invasive Surgery [facility], [place]

## 2022-04-25 NOTE — Anesthesia Preprocedure Evaluation (Addendum)
Anesthesia Evaluation  Patient identified by MRN, date of birth, ID band Patient awake    Reviewed: Allergy & Precautions, NPO status , Patient's Chart, lab work & pertinent test results  Airway Mallampati: II  TM Distance: >3 FB Neck ROM: Full    Dental  (+) Teeth Intact, Dental Advisory Given, Caps,    Pulmonary asthma , sleep apnea and Continuous Positive Airway Pressure Ventilation , former smoker   Pulmonary exam normal breath sounds clear to auscultation       Cardiovascular hypertension, + CAD and +CHF  Normal cardiovascular exam Rhythm:Regular Rate:Normal     Neuro/Psych  Headaches PSYCHIATRIC DISORDERS Anxiety Depression     Neuromuscular disease    GI/Hepatic hiatal hernia,GERD  Medicated,,(+) Cirrhosis  (NASH)      INCARCERATED UMBILICAL HERNIA   Endo/Other  diabetes  Obesity   Renal/GU Renal disease (AKI) Bladder dysfunction      Musculoskeletal  (+) Arthritis ,    Abdominal   Peds  Hematology  (+) Blood dyscrasia (Plavix)   Anesthesia Other Findings Day of surgery medications reviewed with the patient.  Reproductive/Obstetrics                             Anesthesia Physical Anesthesia Plan  ASA: 3  Anesthesia Plan: General   Post-op Pain Management: Ofirmev IV (intra-op)*   Induction: Intravenous  PONV Risk Score and Plan: 3 and Dexamethasone and Ondansetron  Airway Management Planned: Oral ETT  Additional Equipment:   Intra-op Plan:   Post-operative Plan: Extubation in OR  Informed Consent: I have reviewed the patients History and Physical, chart, labs and discussed the procedure including the risks, benefits and alternatives for the proposed anesthesia with the patient or authorized representative who has indicated his/her understanding and acceptance.     Dental advisory given  Plan Discussed with: CRNA  Anesthesia Plan Comments:         Anesthesia Quick Evaluation

## 2022-04-25 NOTE — Anesthesia Procedure Notes (Signed)
Procedure Name: Intubation Date/Time: 04/25/2022 12:46 PM  Performed by: Wilburn Cornelia, CRNAPre-anesthesia Checklist: Patient identified, Emergency Drugs available, Suction available, Patient being monitored and Timeout performed Patient Re-evaluated:Patient Re-evaluated prior to induction Oxygen Delivery Method: Circle system utilized Preoxygenation: Pre-oxygenation with 100% oxygen Induction Type: IV induction and Rapid sequence Laryngoscope Size: Mac and 3 Grade View: Grade II Tube type: Oral Tube size: 7.0 mm Number of attempts: 1 Airway Equipment and Method: Stylet Placement Confirmation: ETT inserted through vocal cords under direct vision, positive ETCO2, CO2 detector and breath sounds checked- equal and bilateral Secured at: 21 cm Tube secured with: Tape Dental Injury: Teeth and Oropharynx as per pre-operative assessment

## 2022-04-26 ENCOUNTER — Encounter (HOSPITAL_COMMUNITY): Payer: Self-pay | Admitting: Surgery

## 2022-04-26 DIAGNOSIS — I2510 Atherosclerotic heart disease of native coronary artery without angina pectoris: Secondary | ICD-10-CM | POA: Diagnosis not present

## 2022-04-26 DIAGNOSIS — E7849 Other hyperlipidemia: Secondary | ICD-10-CM | POA: Diagnosis not present

## 2022-04-26 DIAGNOSIS — K56609 Unspecified intestinal obstruction, unspecified as to partial versus complete obstruction: Secondary | ICD-10-CM | POA: Diagnosis not present

## 2022-04-26 DIAGNOSIS — K436 Other and unspecified ventral hernia with obstruction, without gangrene: Secondary | ICD-10-CM | POA: Diagnosis not present

## 2022-04-26 LAB — BASIC METABOLIC PANEL
Anion gap: 7 (ref 5–15)
BUN: 14 mg/dL (ref 8–23)
CO2: 24 mmol/L (ref 22–32)
Calcium: 7.7 mg/dL — ABNORMAL LOW (ref 8.9–10.3)
Chloride: 107 mmol/L (ref 98–111)
Creatinine, Ser: 0.7 mg/dL (ref 0.44–1.00)
GFR, Estimated: >60 mL/min (ref >60)
Glucose, Bld: 111 mg/dL — ABNORMAL HIGH (ref 70–99)
Potassium: 3.9 mmol/L (ref 3.5–5.1)
Sodium: 138 mmol/L (ref 135–145)

## 2022-04-26 LAB — CBC
HCT: 37.8 % (ref 36.0–46.0)
Hemoglobin: 12.4 g/dL (ref 12.0–15.0)
MCH: 32.1 pg (ref 26.0–34.0)
MCHC: 32.8 g/dL (ref 30.0–36.0)
MCV: 97.9 fL (ref 80.0–100.0)
Platelets: 192 10*3/uL (ref 150–400)
RBC: 3.86 MIL/uL — ABNORMAL LOW (ref 3.87–5.11)
RDW: 14.9 % (ref 11.5–15.5)
WBC: 6.4 10*3/uL (ref 4.0–10.5)
nRBC: 0 % (ref 0.0–0.2)

## 2022-04-26 MED ORDER — ACETAMINOPHEN 10 MG/ML IV SOLN
1000.0000 mg | Freq: Four times a day (QID) | INTRAVENOUS | Status: AC
  Administered 2022-04-26 – 2022-04-27 (×4): 1000 mg via INTRAVENOUS
  Filled 2022-04-26 (×4): qty 100

## 2022-04-26 MED ORDER — KCL IN DEXTROSE-NACL 20-5-0.45 MEQ/L-%-% IV SOLN
INTRAVENOUS | Status: DC
  Filled 2022-04-26 (×3): qty 1000

## 2022-04-26 MED ORDER — HYDROMORPHONE HCL 1 MG/ML IJ SOLN
1.0000 mg | INTRAMUSCULAR | Status: DC | PRN
  Administered 2022-04-26 – 2022-04-27 (×5): 1 mg via INTRAVENOUS
  Filled 2022-04-26 (×5): qty 1

## 2022-04-26 MED ORDER — METHOCARBAMOL 1000 MG/10ML IJ SOLN
500.0000 mg | Freq: Three times a day (TID) | INTRAVENOUS | Status: DC
  Administered 2022-04-26 – 2022-04-29 (×10): 500 mg via INTRAVENOUS
  Filled 2022-04-26 (×5): qty 500
  Filled 2022-04-26 (×4): qty 5
  Filled 2022-04-26 (×2): qty 500
  Filled 2022-04-26: qty 5

## 2022-04-26 NOTE — Progress Notes (Signed)
[facility]                                                                              Heather [name], is a 77 y.o. female, DOB - [birth year], [id number] Admit date - 04/24/2022    Outpatient Primary MD for the patient is Quay Burow, [name], MD  LOS - 0  days  Chief Complaint  Patient presents with   Abdominal Pain   generalized weakness   Emesis       Brief summary   Patient is a 77 year old female with HTN, OSA on CPAP, prediabetes, HLP, CAD, NASH presented with abdominal pain.  Per patient she had a full meal a week ago on Saturday, 2/17 night.  Next day on Sunday she developed generalized abdominal pain that progressively became worse.  Initially it slightly improved however has been continuous since then with nausea and vomiting for the first 3 days.  No BM for the last 2 to 3 days.  Her abdomen was distended. In ED, CT abdomen pelvis noted distal SBO with single point of transition within the umbilical hernia sac just proximal to the ileocecal valve.  Approximately small bowel is diffusely dilated and fluid-filled. Surgery was consulted and patient was admitted for further workup.    Assessment & Plan    Principal Problem:   SBO (small bowel obstruction) (HCC) with a ventral hernia with obstruction -Presented with abdominal pain, nausea and vomiting, poor p.o. intake, incarcerated umbilical hernia -Surgery consulted, underwent open primary incarcerated umbilical hernia repair and appendectomy, postop day #1 -Continue n.p.o., NGT to I WS, pain control -Management per surgery  Active Problems: Hiatal hernia -Continue IV PPI    CAD S/P percutaneous coronary angioplasty -Plavix, Lasix on hold, currently no acute chest pain.    Hyperlipidemia -Continue to hold p.o. meds, Crestor, Zetia on hold  Essential hypertension -BP soft but stable, continue IV fluid hydration.  Lasix on hold    Hyponatremia -Likely due to #1, continue IV fluid  hydration  Mild acute kidney injury -Likely due to #1, poor p.o. intake, nausea and vomiting in the last 1 week -Creatinine 1.09 on admission, baseline 0.8, trended up to 1.1 -Continue IVF, follow bmet  ObesityBMI 36.0-36.9,adult Estimated body mass index is 35.53 kg/m as calculated from the following:   Height as of this encounter: '5\' 4"'$  (1.626 m).   Weight as of this encounter: 93.9 kg.  Code Status: Full code DVT Prophylaxis:  enoxaparin (LOVENOX) injection 40 mg Start: 04/25/22 1000   Level of Care: Level of care: Telemetry Medical Family Communication: Updated patient Disposition Plan:      Remains inpatient appropriate:    Procedures:   Open primary incarcerated umbilical hernia repair and appendectomy on 04/25/2022  Consultants: Surgery   Antimicrobials: None  Medications  enoxaparin (LOVENOX) injection  40 mg Subcutaneous Daily   pantoprazole (PROTONIX) IV  40 mg Intravenous Daily      Subjective:   Heather [name] was seen and examined today.  Having pain, did not sleep well last night, was not able to wear the CPAP.  Still having bilious output from the NGT.   Objective:  Vitals:   04/25/22 0645 04/25/22 0730 04/25/22 0800 04/25/22 0958  BP: 111/60 115/65 (!) 105/56 95/62  Pulse: (!) 105 (!) 104 (!) 101 98  Resp: (!) 26 (!) 37 18 20  Temp:    98.4 F (36.9 C)  TempSrc:      SpO2: 90% 92% 98% 100%  Weight:      Height:        Intake/Output Summary (Last 24 hours) at 04/25/2022 1048 Last data filed at 04/25/2022 1006 Gross per 24 hour  Intake 1957.33 ml  Output --  Net 1957.33 ml     Wt Readings from Last 3 Encounters:  04/24/22 93.9 kg  04/23/22 93.9 kg  04/14/22 97.1 kg    Physical Exam General: Alert and oriented x 3, NAD, NGT, uncomfortable with pain Cardiovascular: S1 S2 clear, RRR.  Respiratory: CTAB Gastrointestinal: Soft, appropriately tender, decreased BS Ext: no pedal edema bilaterally Neuro: no new deficits Psych:  uncomfortable   Data Reviewed:  I have personally reviewed following labs    CBC Lab Results  Component Value Date   WBC 4.5 04/25/2022   RBC 5.18 (H) 04/25/2022   HGB 16.6 (H) 04/25/2022   HCT 49.4 (H) 04/25/2022   MCV 95.4 04/25/2022   MCH 32.0 04/25/2022   PLT 216 04/25/2022   MCHC 33.6 04/25/2022   RDW 14.6 04/25/2022   LYMPHSABS 0.6 (L) 04/25/2022   MONOABS 0.8 04/25/2022   EOSABS 0.0 04/25/2022   BASOSABS 0.0 [id number]     Last metabolic panel Lab Results  Component Value Date   NA 130 (L) 04/25/2022   K 3.6 04/25/2022   CL 87 (L) 04/25/2022   CO2 25 04/25/2022   BUN 32 (H) 04/25/2022   CREATININE 1.18 (H) 04/25/2022   GLUCOSE 87 04/25/2022   GFRNONAA 48 (L) 04/25/2022   GFRAA 100 10/09/2019   CALCIUM 9.9 04/25/2022   PROT 8.2 (H) 04/24/2022   ALBUMIN 3.6 04/24/2022   LABGLOB 3.4 04/14/2021   AGRATIO 1.1 (L) 04/14/2021   BILITOT 0.8 04/24/2022   ALKPHOS 54 04/24/2022   AST 34 04/24/2022   ALT 40 04/24/2022   ANIONGAP 18 (H) 04/25/2022    CBG (last 3)  Recent Labs    04/24/22 1914  GLUCAP 119*      Coagulation Profile: No results for input(s): "INR", "PROTIME" in the last 168 hours.   Radiology Studies: I have personally reviewed the imaging studies  DG Abdomen 1 View  Result Date: 04/25/2022 CLINICAL DATA:  77 year old female status post nasogastric tube placement. EXAM: ABDOMEN - 1 VIEW COMPARISON:  CT the abdomen and pelvis 04/24/2022. FINDINGS: Unusual positioning of the nasogastric tube whose tip projects over the lower right hemithorax, presumably within the patient's large hiatal hernia, likely in the periphery of the stomach based on comparison with prior CT examination. IMPRESSION: 1. Tip of nasogastric tube projects over the lower right hemithorax, presumably within the stomach based on comparison with prior CT examination. Electronically Signed   By: Vinnie Langton M.D.   On: 04/25/2022 05:17   CT ABDOMEN PELVIS W CONTRAST  Result  Date: 04/25/2022 CLINICAL DATA:  Abdominal pain, acute, nonlocalized.  Vomiting. EXAM: CT ABDOMEN AND PELVIS WITH CONTRAST TECHNIQUE: Multidetector CT imaging of the abdomen and pelvis was performed using the standard protocol following bolus administration of intravenous contrast. RADIATION DOSE REDUCTION: This exam was performed according to the departmental dose-optimization program which includes automated exposure control, adjustment of the mA and/or kV according to patient size and/or use  of iterative reconstruction technique. CONTRAST:  6m OMNIPAQUE IOHEXOL 350 MG/ML SOLN COMPARISON:  CT chest 01/14/2018 FINDINGS: Lower chest: Large hiatal hernia is again identified containing the entire stomach, the majority of the transverse colon, and several loops of dilated, fluid-filled small bowel. Right basilar atelectasis. No pleural effusion. Mild mass effect upon the base of the heart by the large hiatal hernia, increased since prior examination. Hepatobiliary: No focal liver abnormality is seen. No gallstones, gallbladder wall thickening, or biliary dilatation. Pancreas: Unremarkable Spleen: Unremarkable Adrenals/Urinary Tract: Adrenal glands are unremarkable. Kidneys are normal, without renal calculi, focal lesion, or hydronephrosis. Bladder is unremarkable. Stomach/Bowel: A distal small bowel obstruction is present with a single point of transition identified within an bili cul hernia sac just proximal to the ileocecal valve seen on axial image # 60/3. Proximally, the small bowel is diffusely dilated and fluid-filled. The distal tip of the cecum has also herniated into the umbilical hernia and the appendix is fluid-filled, dilated, and hyperemic, best seen on axial image # 68/3. This may relate to obstruction involving the distal tip of the cecum and appendix, however, superimposed appendicitis within the umbilical hernia sac would appear similarly. As noted previously, several loops of small bowel and large  bowel herniate into the large hiatal hernia into the thoracic compartment and there is marked narrowing of several loops of large and small bowel as they extend into the hernia sac, however, these do not appear to result in focal obstruction by themselves. No free intraperitoneal gas or fluid. Superimposed moderate to severe descending and sigmoid colonic diverticulosis without superimposed acute inflammatory change in this location. Vascular/Lymphatic: Aortic atherosclerosis. No enlarged abdominal or pelvic lymph nodes. Reproductive: Heterogeneous enhancement of the uterus relates to multiple underlying uterine fibroids. No adnexal masses are seen. Other: None Musculoskeletal: Degenerative changes are seen within the lumbar spine. No acute bone abnormality. No lytic or blastic bone lesion. IMPRESSION: 1. Distal small bowel obstruction with a single point of transition within an umbilical hernia sac just proximal to the ileocecal valve. Proximally, the small bowel is diffusely dilated and fluid-filled. 2. The distal tip of the cecum and appendix has also herniated into the umbilical hernia and the appendix is fluid-filled, dilated, and hyperemic. This may relate to obstruction involving the distal tip of the cecum and appendix, however, superimposed appendicitis within the umbilical hernia sac would appear similarly. 3. Large hiatal hernia containing the entire stomach, the majority of the transverse colon, and several loops of fluid-filled dilated small bowel. Narrowing of several loops of bowel at the hiatal orifice is noted without evidence of obstruction. 4. Moderate to severe distal colonic diverticulosis without superimposed acute inflammatory change. 5. Fibroid uterus. 6. Aortic atherosclerosis. Aortic Atherosclerosis (ICD10-I70.0). Electronically Signed   By: AFidela SalisburyM.D.   On: 04/25/2022 00:51       Demetrus Pavao M.D. [facility] 04/25/2022, 10:48 AM  Available via Epic secure chat  7am-7pm After 7 pm, please refer to night coverage provider listed on amion.

## 2022-04-26 NOTE — Progress Notes (Signed)
1 Day Post-Op Open primary incarcerated umbilical hernia repair and appendectomy  Subjective: Having some pain, controlled with IV meds, having trouble sleeping due to noise  Objective: Vital signs in last 24 hours: Temp:  [97.7 F (36.5 C)-99.8 F (37.7 C)] 99.1 F (37.3 C) (02/25 0421) Pulse Rate:  [79-113] 98 (02/25 0421) Resp:  [16-32] 16 (02/25 0421) BP: (95-140)/(54-84) 121/59 (02/25 0421) SpO2:  [78 %-100 %] 98 % (02/25 0421) Weight:  [94 kg] 94 kg (02/24 1153)   Intake/Output from previous day: 02/24 0701 - 02/25 0700 In: 1958.3 [I.V.:1000; IV Piggyback:958.3] Out: 700 [Urine:600; Blood:100] Intake/Output this shift: No intake/output data recorded.   General appearance: alert and cooperative GI: normal findings: soft, appropriately TTP NG with clear output Incision: no significant drainage  Lab Results:  Recent Labs    04/24/22 1924 04/25/22 0448  WBC 4.9 4.5  HGB 16.1* 16.6*  HCT 47.7* 49.4*  PLT 229 216   BMET Recent Labs    04/24/22 1924 04/25/22 0448  NA 130* 130*  K 3.6 3.6  CL 90* 87*  CO2 26 25  GLUCOSE 139* 87  BUN 28* 32*  CREATININE 1.09* 1.18*  CALCIUM 9.9 9.9   PT/INR No results for input(s): "LABPROT", "INR" in the last 72 hours. ABG No results for input(s): "PHART", "HCO3" in the last 72 hours.  Invalid input(s): "PCO2", "PO2"  MEDS, Scheduled  enoxaparin (LOVENOX) injection  40 mg Subcutaneous Daily   pantoprazole (PROTONIX) IV  40 mg Intravenous Daily    Studies/Results: DG Abdomen 1 View  Result Date: 04/25/2022 CLINICAL DATA:  77 year old female status post nasogastric tube placement. EXAM: ABDOMEN - 1 VIEW COMPARISON:  CT the abdomen and pelvis 04/24/2022. FINDINGS: Unusual positioning of the nasogastric tube whose tip projects over the lower right hemithorax, presumably within the patient's large hiatal hernia, likely in the periphery of the stomach based on comparison with prior CT examination. IMPRESSION: 1. Tip of  nasogastric tube projects over the lower right hemithorax, presumably within the stomach based on comparison with prior CT examination. Electronically Signed   By: Vinnie Langton M.D.   On: 04/25/2022 05:17   CT ABDOMEN PELVIS W CONTRAST  Result Date: 04/25/2022 CLINICAL DATA:  Abdominal pain, acute, nonlocalized.  Vomiting. EXAM: CT ABDOMEN AND PELVIS WITH CONTRAST TECHNIQUE: Multidetector CT imaging of the abdomen and pelvis was performed using the standard protocol following bolus administration of intravenous contrast. RADIATION DOSE REDUCTION: This exam was performed according to the departmental dose-optimization program which includes automated exposure control, adjustment of the mA and/or kV according to patient size and/or use of iterative reconstruction technique. CONTRAST:  47m OMNIPAQUE IOHEXOL 350 MG/ML SOLN COMPARISON:  CT chest 01/14/2018 FINDINGS: Lower chest: Large hiatal hernia is again identified containing the entire stomach, the majority of the transverse colon, and several loops of dilated, fluid-filled small bowel. Right basilar atelectasis. No pleural effusion. Mild mass effect upon the base of the heart by the large hiatal hernia, increased since prior examination. Hepatobiliary: No focal liver abnormality is seen. No gallstones, gallbladder wall thickening, or biliary dilatation. Pancreas: Unremarkable Spleen: Unremarkable Adrenals/Urinary Tract: Adrenal glands are unremarkable. Kidneys are normal, without renal calculi, focal lesion, or hydronephrosis. Bladder is unremarkable. Stomach/Bowel: A distal small bowel obstruction is present with a single point of transition identified within an bili cul hernia sac just proximal to the ileocecal valve seen on axial image # 60/3. Proximally, the small bowel is diffusely dilated and fluid-filled. The distal tip of the cecum has  also herniated into the umbilical hernia and the appendix is fluid-filled, dilated, and hyperemic, best seen on  axial image # 68/3. This may relate to obstruction involving the distal tip of the cecum and appendix, however, superimposed appendicitis within the umbilical hernia sac would appear similarly. As noted previously, several loops of small bowel and large bowel herniate into the large hiatal hernia into the thoracic compartment and there is marked narrowing of several loops of large and small bowel as they extend into the hernia sac, however, these do not appear to result in focal obstruction by themselves. No free intraperitoneal gas or fluid. Superimposed moderate to severe descending and sigmoid colonic diverticulosis without superimposed acute inflammatory change in this location. Vascular/Lymphatic: Aortic atherosclerosis. No enlarged abdominal or pelvic lymph nodes. Reproductive: Heterogeneous enhancement of the uterus relates to multiple underlying uterine fibroids. No adnexal masses are seen. Other: None Musculoskeletal: Degenerative changes are seen within the lumbar spine. No acute bone abnormality. No lytic or blastic bone lesion. IMPRESSION: 1. Distal small bowel obstruction with a single point of transition within an umbilical hernia sac just proximal to the ileocecal valve. Proximally, the small bowel is diffusely dilated and fluid-filled. 2. The distal tip of the cecum and appendix has also herniated into the umbilical hernia and the appendix is fluid-filled, dilated, and hyperemic. This may relate to obstruction involving the distal tip of the cecum and appendix, however, superimposed appendicitis within the umbilical hernia sac would appear similarly. 3. Large hiatal hernia containing the entire stomach, the majority of the transverse colon, and several loops of fluid-filled dilated small bowel. Narrowing of several loops of bowel at the hiatal orifice is noted without evidence of obstruction. 4. Moderate to severe distal colonic diverticulosis without superimposed acute inflammatory change. 5.  Fibroid uterus. 6. Aortic atherosclerosis. Aortic Atherosclerosis (ICD10-I70.0). Electronically Signed   By: Fidela Salisbury M.D.   On: 04/25/2022 00:51    Assessment: s/p Procedure(s): OPEN INCARCERATED HERNIA REPAIR UMBILICAL WITH APPENDECTOMY Patient Active Problem List   Diagnosis Date Noted   SBO (small bowel obstruction) ([place]) 04/25/2022   Hyperlipidemia 04/25/2022   Obstructive sleep apnea treated with BiPAP 04/25/2022   Dependence on nocturnal oxygen therapy 04/25/2022   Hyponatremia 04/25/2022   Generalized abdominal pain 04/23/2022   BMI 36.0-36.9,adult 04/08/2022   Obesity, Beginning BMI 44.60 04/08/2022   Persistent cough for 3 weeks or longer 02/13/2022   Type 2 diabetes mellitus with other specified complication ([place]) [id number]   Traumatic tear of supraspinatus tendon of right shoulder 11/24/2021   Right arm pain 11/18/2021   Positive self-administered antigen test for COVID-19 11/09/2021   Stress 10/16/2021   Status post left knee replacement 03/12/2021   Status post total left knee replacement 03/11/2021   Chronic pain 01/06/2021   Lumbar radiculopathy 07/04/2020   Bilateral sciatica 07/04/2020   Pain in both hands 07/04/2020   Hyperlipidemia associated with type 2 diabetes mellitus ([place]) 05/01/2020   Abnormal blood level of copper 03/14/2020   Leukopenia 03/14/2020   Obesity (BMI 30-39.9) 11/13/2019   Chronic pain of right thumb 10/11/2019   Vitamin D deficiency 07/27/2019   Memory loss 04/27/2019   Mass of left wrist 01/18/2019   Ventral hernia without obstruction or gangrene 01/18/2019   Mass of right thigh 09/13/2018   Depression 08/02/2018   Sleeps in sitting position due to orthopnea 07/13/2018   BiPAP (biphasic positive airway pressure) dependence 05/24/2018   Goiter 04/15/2017   Urinary incontinence 04/15/2017   Type 2 diabetes mellitus without  complication, without long-term current use of insulin ([place]) 02/15/2017   Bilateral leg cramps 01/05/2017    Peripheral neuropathy 05/27/2016   Elevated sed rate 03/05/2016   Muscle spasm of both lower legs 02/12/2016   Edema of both ankles 02/12/2016   Iron deficiency anemia 02/12/2016   Hypokalemia 02/12/2016   Pernicious anemia 02/12/2016   Degenerative disc disease, lumbar 04/09/2015   Degenerative arthritis of knee, bilateral 03/13/2015   Chronic low back pain 03/13/2015   CAD S/P percutaneous coronary angioplasty 10/31/2013   Incidental lung nodule, > 424m and < 8324m Left lower lobe 24m68m June 2014, April 2015 unchanged 06/25/2013   OSA on CPAP 06/21/2013   Myalgia and myositis 05/02/2013   Muscle ache 04/03/2013   Class 3 severe obesity with serious comorbidity and body mass index (BMI) of 40.0 to 44.9 in adult [place]/25/2014   Large hiatal hernia 08/24/2012    LLL nodule 6mm70mJune 2014, described in 2003 07/13/2012   Asthma, chronic 06/29/2012   Migraine without aura 03/11/2012   SOB (shortness of breath) 12/28/2011   Hx of fibroids 08/03/2011   Knee pain, left 12/25/2010   NECK PAIN 12/03/2009   TINNITUS 11/22/2007   HEARING LOSS 11/22/2007   Chronic pain of both knees 11/22/2007   Gastroesophageal reflux disease without esophagitis 07/20/2007   NASH (nonalcoholic steatohepatitis) 07/20/2007   ANXIETY 10/22/2006   Essential hypertension 10/22/2006   Allergic rhinitis 10/22/2006   Osteoarthritis 10/22/2006    Expected post op course  Plan: Cont NG and NPO MIVF's Ambulate Record NG output Multimodality pain control   LOS: 1 day     .AlicRosario Adie CFort Pierce Northgery, PA  [place]2/25/2024 7:54 AM

## 2022-04-26 NOTE — Progress Notes (Signed)
Patient declined the use of CPAP for the night  

## 2022-04-27 DIAGNOSIS — I2510 Atherosclerotic heart disease of native coronary artery without angina pectoris: Secondary | ICD-10-CM | POA: Diagnosis not present

## 2022-04-27 DIAGNOSIS — E7849 Other hyperlipidemia: Secondary | ICD-10-CM | POA: Diagnosis not present

## 2022-04-27 DIAGNOSIS — K436 Other and unspecified ventral hernia with obstruction, without gangrene: Secondary | ICD-10-CM | POA: Diagnosis not present

## 2022-04-27 DIAGNOSIS — K56609 Unspecified intestinal obstruction, unspecified as to partial versus complete obstruction: Secondary | ICD-10-CM | POA: Diagnosis not present

## 2022-04-27 LAB — CBC
HCT: 37.7 % (ref 36.0–46.0)
Hemoglobin: 11.6 g/dL — ABNORMAL LOW (ref 12.0–15.0)
MCH: 31.2 pg (ref 26.0–34.0)
MCHC: 30.8 g/dL (ref 30.0–36.0)
MCV: 101.3 fL — ABNORMAL HIGH (ref 80.0–100.0)
Platelets: 188 10*3/uL (ref 150–400)
RBC: 3.72 MIL/uL — ABNORMAL LOW (ref 3.87–5.11)
RDW: 15 % (ref 11.5–15.5)
WBC: 7.2 10*3/uL (ref 4.0–10.5)
nRBC: 0 % (ref 0.0–0.2)

## 2022-04-27 LAB — BASIC METABOLIC PANEL
Anion gap: 6 (ref 5–15)
BUN: 12 mg/dL (ref 8–23)
CO2: 25 mmol/L (ref 22–32)
Calcium: 7.8 mg/dL — ABNORMAL LOW (ref 8.9–10.3)
Chloride: 104 mmol/L (ref 98–111)
Creatinine, Ser: 0.63 mg/dL (ref 0.44–1.00)
GFR, Estimated: >60 mL/min (ref >60)
Glucose, Bld: 114 mg/dL — ABNORMAL HIGH (ref 70–99)
Potassium: 4 mmol/L (ref 3.5–5.1)
Sodium: 135 mmol/L (ref 135–145)

## 2022-04-27 MED ORDER — ACETAMINOPHEN 500 MG PO TABS
1000.0000 mg | ORAL_TABLET | Freq: Four times a day (QID) | ORAL | Status: DC
  Administered 2022-04-27 – 2022-04-30 (×11): 1000 mg via ORAL
  Filled 2022-04-27 (×13): qty 2

## 2022-04-27 MED ORDER — BISACODYL 10 MG RE SUPP
10.0000 mg | Freq: Once | RECTAL | Status: AC
  Administered 2022-04-27: 10 mg via RECTAL
  Filled 2022-04-27: qty 1

## 2022-04-27 NOTE — Progress Notes (Signed)
Clamped NG tube.

## 2022-04-27 NOTE — Plan of Care (Signed)
  Problem: Education: Goal: Knowledge of General Education information will improve Description Including pain rating scale, medication(s)/side effects and non-pharmacologic comfort measures Outcome: Progressing   Problem: Health Behavior/Discharge Planning: Goal: Ability to manage health-related needs will improve Outcome: Progressing   

## 2022-04-27 NOTE — Progress Notes (Signed)
[facility]                                                                              Heather [name], is a 77 y.o. female, DOB - [birth year], [id number] Admit date - 04/24/2022    Outpatient Primary MD for the patient is Quay Burow, [name], MD  LOS - 2  days  Chief Complaint  Patient presents with   Abdominal Pain   generalized weakness   Emesis       Brief summary   Patient is a 77 year old female with HTN, OSA on CPAP, prediabetes, HLP, CAD, NASH presented with abdominal pain.  Per patient she had a full meal a week ago on Saturday, 2/17 night.  Next day on Sunday she developed generalized abdominal pain that progressively became worse.  Initially it slightly improved however has been continuous since then with nausea and vomiting for the first 3 days.  No BM for the last 2 to 3 days.  Her abdomen was distended. In ED, CT abdomen pelvis noted distal SBO with single point of transition within the umbilical hernia sac just proximal to the ileocecal valve.  Approximately small bowel is diffusely dilated and fluid-filled. Surgery was consulted and patient was admitted for further workup.    Assessment & Plan    Principal Problem:   SBO (small bowel obstruction) ([facility]) with a ventral hernia with obstruction -Presented with abdominal pain, nausea and vomiting, poor p.o. intake, incarcerated umbilical hernia -underwent open primary incarcerated umbilical hernia repair and appendectomy on 2/24, postop day # 2 -Management per surgery, on NGT to intermittent wall suction, mobilize -No flatus or BM yet  Active Problems: Hiatal hernia -Continue IV Protonix    CAD S/P percutaneous coronary angioplasty -Plavix, Lasix on hold. -Will resume Plavix once cleared by surgery    Hyperlipidemia -Currently p.o. Crestor, Zetia on hold   Essential hypertension -Continue to hold Lasix, BP stable -Continue IVF    Hyponatremia -Resolved  Mild acute kidney injury -Likely  due to #1, poor p.o. intake, nausea and vomiting in the last 1 week -Creatinine 1.09 on admission, baseline 0.8, trended up to 1.1 -Resolved, creatinine 0.6  ObesityBMI 36.0-36.9,adult Estimated body mass index is 35.55 kg/m as calculated from the following:   Height as of this encounter: 5' 4.02" (1.626 m).   Weight as of this encounter: 94 kg.  Code Status: Full code DVT Prophylaxis:  enoxaparin (LOVENOX) injection 40 mg Start: 04/25/22 1000   Level of Care: Level of care: Telemetry Medical Family Communication: Updated patient Disposition Plan:      Remains inpatient appropriate:    Procedures:   Open primary incarcerated umbilical hernia repair and appendectomy on 04/25/2022  Consultants: Surgery   Antimicrobials: None  Medications  acetaminophen  1,000 mg Oral Q6H   bisacodyl  10 mg Rectal Once   enoxaparin (LOVENOX) injection  40 mg Subcutaneous Daily   pantoprazole (PROTONIX) IV  40 mg Intravenous Daily      Subjective:   Heather [name] was seen and examined today.  No flatus or BM yet.  Abdominal pain improving.  Still has NGT   Objective:  Vitals:   04/26/22 0931 04/26/22 1750 04/26/22 2129 04/27/22 0658  BP: (!) 103/57 90/68 136/70 117/64  Pulse: 94 79 86 79  Resp: (!) '21 16 18 18  '$ Temp: 98.7 F (37.1 C) 97.9 F (36.6 C) 98.3 F (36.8 C) 98.7 F (37.1 C)  TempSrc: Oral Oral Oral Oral  SpO2: 100% 100% 100% 100%  Weight:      Height:        Intake/Output Summary (Last 24 hours) at 04/27/2022 1415 Last data filed at 04/26/2022 1730 Gross per 24 hour  Intake 882.38 ml  Output --  Net 882.38 ml     Wt Readings from Last 3 Encounters:  04/25/22 94 kg  04/23/22 93.9 kg  04/14/22 97.1 kg   Physical Exam General: Alert and oriented x 3, NAD Cardiovascular: S1 S2 clear, RRR.  Respiratory: CTAB, no wheezing Gastrointestinal: Soft, hypoactive BS, midline incision CDI Ext: no pedal edema bilaterally Neuro: no new deficits Psych: Normal affect     Data Reviewed:  I have personally reviewed following labs    CBC Lab Results  Component Value Date   WBC 7.2 04/27/2022   RBC 3.72 (L) 04/27/2022   HGB 11.6 (L) 04/27/2022   HCT 37.7 04/27/2022   MCV 101.3 (H) 04/27/2022   MCH 31.2 04/27/2022   PLT 188 04/27/2022   MCHC 30.8 04/27/2022   RDW 15.0 04/27/2022   LYMPHSABS 0.6 (L) 04/25/2022   MONOABS 0.8 04/25/2022   EOSABS 0.0 04/25/2022   BASOSABS 0.0 [id number]     Last metabolic panel Lab Results  Component Value Date   NA 135 04/27/2022   K 4.0 04/27/2022   CL 104 04/27/2022   CO2 25 04/27/2022   BUN 12 04/27/2022   CREATININE 0.63 04/27/2022   GLUCOSE 114 (H) 04/27/2022   GFRNONAA >60 04/27/2022   GFRAA 100 10/09/2019   CALCIUM 7.8 (L) 04/27/2022   PROT 8.2 (H) 04/24/2022   ALBUMIN 3.6 04/24/2022   LABGLOB 3.4 04/14/2021   AGRATIO 1.1 (L) 04/14/2021   BILITOT 0.8 04/24/2022   ALKPHOS 54 04/24/2022   AST 34 04/24/2022   ALT 40 04/24/2022   ANIONGAP 6 04/27/2022    CBG (last 3)  Recent Labs    04/24/22 1914  GLUCAP 119*      Coagulation Profile: No results for input(s): "INR", "PROTIME" in the last 168 hours.   Radiology Studies: I have personally reviewed the imaging studies  No results found.     Estill Cotta M.D. [facility] 04/27/2022, 2:15 PM  Available via Epic secure chat 7am-7pm After 7 pm, please refer to night coverage provider listed on amion.

## 2022-04-27 NOTE — Evaluation (Signed)
Occupational Therapy Evaluation Patient Details Name: Heather [name] MRN: [id number] DOB: [birth year] Today's Date: 04/27/2022   History of Present Illness Patient is a 77 y/o female who presents on 2/23 with abdominal pain, emesis and weakness. Found to have SBO secondary to umbilical hernia now s/p open incarcerated umbilical hernia repair and appendectomy 2/24. PMH includes DM, obesity, UI, peripheral neuropathy, CAD, CHF, OSA on CPAP.   Clinical Impression   Pt is typically independent in ADL and mobility with [facility] for community. Her house is universally accessible with elevator and bathroom set up being handicap friendly. Today she is limited in distance of mobility by connection to suction through NG tube (supposed to come out today). Pt is overall mod to max A for LB ADL, and set up to min A for UB ADL. Limited access to LB for ADL due to abdominal pain - will need AE education next session in conjunction with energy conservation education. Pt transferred with RW and mod A for initial boost into standing and then min guard for pivot to recliner. Pt also able to maintain standing for prolonged period. Pt on 3L O2 throughout session and at times SOB. OT will continue to follow acutely and plan for [place] post-acute to maximize safety and independence in ADL and functional transfers.       Recommendations for follow up therapy are one component of a multi-disciplinary discharge planning process, led by the attending physician.  Recommendations may be updated based on patient status, additional functional criteria and insurance authorization.   Follow Up Recommendations  Home health OT     Assistance Recommended at Discharge Intermittent Supervision/Assistance  Patient can return home with the following A little help with walking and/or transfers;A lot of help with bathing/dressing/bathroom;Assistance with cooking/housework;Assist for transportation;Help with stairs or ramp for entrance     Functional Status Assessment  Patient has had a recent decline in their functional status and demonstrates the ability to make significant improvements in function in a reasonable and predictable amount of time.  Equipment Recommendations  None recommended by OT    Recommendations for Other Services PT consult;Other (comment) (Mobility Team)     Precautions / Restrictions Precautions Precautions: Fall Restrictions Weight Bearing Restrictions: No      Mobility Bed Mobility Overal bed mobility: Needs Assistance Bed Mobility: Rolling, Sidelying to Sit Rolling: Mod assist, +2 for safety/equipment Sidelying to sit: Mod assist, HOB elevated, +2 for safety/equipment       General bed mobility comments: cues for sequencing to decrease strain on stomach, use of bed pad to assist with rolling and assist with trunk elevation    Transfers Overall transfer level: Needs assistance Equipment used: Rolling walker (2 wheels) Transfers: Sit to/from Stand, Bed to chair/wheelchair/BSC Sit to Stand: Mod assist, +2 safety/equipment, From elevated surface (use of bed pad to assist with initial power up)     Step pivot transfers: Min guard, +2 safety/equipment     General transfer comment: once on her feet after initial assist to power up, Pt is able to maintain standing for LB dressing, peri care, and pivotal transfer      Balance Overall balance assessment: Needs assistance Sitting-balance support: No upper extremity supported, Feet supported Sitting balance-Leahy Scale: Good     Standing balance support: Bilateral upper extremity supported, During functional activity, Reliant on assistive device for balance Standing balance-Leahy Scale: Poor  ADL either performed or assessed with clinical judgement   ADL Overall ADL's : Needs assistance/impaired Eating/Feeding: NPO   Grooming: Wash/dry hands;Wash/dry face;Sitting;Set up   Upper Body  Bathing: Moderate assistance   Lower Body Bathing: Maximal assistance   Upper Body Dressing : Moderate assistance Upper Body Dressing Details (indicate cue type and reason): new gown Lower Body Dressing: Maximal assistance Lower Body Dressing Details (indicate cue type and reason): socks and pullup Toilet Transfer: +2 for safety/equipment;Stand-pivot;Rolling walker (2 wheels);Moderate assistance Toilet Transfer Details (indicate cue type and reason): use of bed pad to assist with power up - simulated through recliner transfer Downs and Hygiene: Min guard;Sit to/from stand Toileting - Clothing Manipulation Details (indicate cue type and reason): performed own peri care in standing with warm wash cloth     Functional mobility during ADLs: Min guard;+2 for safety/equipment;Rolling walker (2 wheels) (SPT only) General ADL Comments: decreased access to LB - will need AE education     Vision Ability to See in Adequate Light: 0 Adequate Patient Visual Report: No change from baseline Vision Assessment?: No apparent visual deficits     Perception     Praxis      Pertinent Vitals/Pain Pain Assessment Pain Assessment: Faces Faces Pain Scale: Hurts little more Pain Location: abdomen Pain Descriptors / Indicators: Sore, Operative site guarding, Discomfort Pain Intervention(s): Monitored during session, Repositioned     Hand Dominance Right   Extremity/Trunk Assessment Upper Extremity Assessment Upper Extremity Assessment: Overall WFL for tasks assessed   Lower Extremity Assessment Lower Extremity Assessment: Defer to PT evaluation   Cervical / Trunk Assessment Cervical / Trunk Assessment: Other exceptions Cervical / Trunk Exceptions: abdominal sx   Communication Communication Communication: No difficulties   Cognition Arousal/Alertness: Awake/alert Behavior During Therapy: WFL for tasks assessed/performed Overall Cognitive Status: Within Functional  Limits for tasks assessed                                 General Comments: super positive, motivated, fun     General Comments  Pt on 3 L O2 and gets SOB with activity    Exercises     Shoulder Instructions      Home Living Family/patient expects to be discharged to:: Private residence Living Arrangements: Spouse/significant other Available Help at Discharge: Family Type of Home: House Home Access: Stairs to enter Technical brewer of Steps: 3 Entrance Stairs-Rails: Right;Left Home Layout: Multi-level;Laundry or work area in basement Alternate Therapist, sports of Steps: elevator but it does not go downt to the basement   [employer] Shower/Tub: Occupational psychologist: Handicapped height     Home Equipment: Rollator (4 wheels);Cane - single point;Toilet riser   Additional Comments: 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      Prior Functioning/Environment Prior Level of Function : Independent/Modified Independent             Mobility Comments: Uses SPC for ambulation int the community, does not use in house though except to go to the basement ADLs Comments: independent        OT Problem List: Decreased range of motion;Decreased activity tolerance;Impaired balance (sitting and/or standing);Decreased safety awareness;Decreased knowledge of use of DME or AE;Cardiopulmonary status limiting activity;Obesity;Pain      OT Treatment/Interventions: Self-care/ADL training;DME and/or AE instruction;Energy conservation;Therapeutic activities;Patient/family education;Balance training    OT Goals(Current goals can be found in the care plan section) Acute Rehab OT  Goals Patient Stated Goal: get better for her big [employer] Conference in April (50th anniversary) OT Goal Formulation: With patient Time For Goal Achievement: 05/11/22 Potential to Achieve Goals: Good ADL Goals Pt Will Perform Grooming: with supervision;standing Pt Will Perform  Lower Body Bathing: with supervision;with adaptive equipment;sit to/from stand Pt Will Perform Lower Body Dressing: with supervision;sit to/from stand;with adaptive equipment Pt Will Transfer to Toilet: with supervision;ambulating;regular height toilet Pt Will Perform Toileting - Clothing Manipulation and hygiene: sit to/from stand;with supervision Additional ADL Goal #1: Pt will verbalize 3 ways of conserving energy during ADL routine with no cues  OT Frequency: Min 2X/week    Co-evaluation PT/OT/SLP Co-Evaluation/Treatment: Yes Reason for Co-Treatment: For patient/therapist safety;To address functional/ADL transfers PT goals addressed during session: Mobility/safety with mobility;Balance;Proper use of DME;Strengthening/ROM OT goals addressed during session: ADL's and self-care;Strengthening/ROM;Proper use of Adaptive equipment and DME      AM-PAC OT "6 Clicks" Daily Activity     Outcome Measure Help from another person eating meals?: Total (NPO) Help from another person taking care of personal grooming?: A Little Help from another person toileting, which includes using toliet, bedpan, or urinal?: A Lot Help from another person bathing (including washing, rinsing, drying)?: A Lot Help from another person to put on and taking off regular upper body clothing?: A Little Help from another person to put on and taking off regular lower body clothing?: A Lot 6 Click Score: 13   End of Session Equipment Utilized During Treatment: Rolling walker (2 wheels);Oxygen (3L) Nurse Communication: Mobility status (no purewick, using adult pull ups, chair alarm pad underneath but not on)  Activity Tolerance: Patient tolerated treatment well Patient left: in chair;with call bell/phone within reach  OT Visit Diagnosis: Unsteadiness on feet (R26.81);Pain;Muscle weakness (generalized) (M62.81) Pain - Right/Left:  (central) Pain - part of body:  (abdomen)                Time: [id number] OT Time  Calculation (min): 41 min Charges:  OT General Charges $OT Visit: 1 Visit OT Evaluation $OT Eval Moderate Complexity: 1 Mod OT Treatments $Self Care/Home Management : 8-22 mins  Jesse Sans OTR/L Acute Rehabilitation Services Office: [place] 04/27/2022, 10:45 AM

## 2022-04-27 NOTE — Progress Notes (Signed)
Patient Alert and oriented, respiration, even and unlabored. Pleasant and bright at approach, IV to Left hand intact IVF infusing well. Ngt to right nare intact patient had 150 ml output for night shift. Continues to complain of abdominal pain medicated x 2. All safety/fall measures in place. Call bell within reach.

## 2022-04-27 NOTE — Evaluation (Signed)
Physical Therapy Evaluation Patient Details Name: Heather [name] MRN: [id number] DOB: 01/28/1946 Today's Date: 04/27/2022  History of Present Illness  Patient is a 77 y/o female who presents on 2/23 with abdominal pain, emesis and weakness. Found to have SBO secondary to umbilical hernia now s/p open incarcerated umbilical hernia repair and appendectomy 2/24. PMH includes DM, obesity, UI, peripheral neuropathy, CAD, CHF, OSA on CPAP.  Clinical Impression  Patient presents with generalized weakness, pain, impaired balance, SOB and impaired mobility s/p above. Pt lives at home with her spouse and reports using [facility] for ambulation PRN PTA. 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 Today, pt requires Mod A of 2 for bed mobility and standing transfers from elevated bed. Able to take a few steps to get to chair today. Demonstrates good standing tolerance for ADLs tasks. Noted to have SOB with walking and minimal activity. Pt not on 02 at baseline but wearing 3L 02 [place] here. Will likely progress well. Will follow acutely to maximize independence and mobility prior to return home.    Recommendations for follow up therapy are one component of a multi-disciplinary discharge planning process, led by the attending physician.  Recommendations may be updated based on patient status, additional functional criteria and insurance authorization.  Follow Up Recommendations Home health PT      Assistance Recommended at Discharge Intermittent Supervision/Assistance  Patient can return home with the following  A little help with walking and/or transfers;A little help with bathing/dressing/bathroom;Help with stairs or ramp for entrance;Assistance with cooking/housework;Assist for transportation    Equipment Recommendations None recommended by PT  Recommendations for Other Services       Functional Status Assessment Patient has had a recent decline in their functional status and demonstrates the  ability to make significant improvements in function in a reasonable and predictable amount of time.     Precautions / Restrictions Precautions Precautions: Fall;Other (comment) Precaution Comments: NGT to suction Restrictions Weight Bearing Restrictions: No      Mobility  Bed Mobility Overal bed mobility: Needs Assistance Bed Mobility: Rolling, Sidelying to Sit Rolling: Mod assist, +2 for safety/equipment Sidelying to sit: Mod assist, HOB elevated, +2 for safety/equipment       General bed mobility comments: cues for sequencing to decrease strain on stomach, use of bed pad to assist with rolling and assist with trunk elevation    Transfers Overall transfer level: Needs assistance Equipment used: Rolling walker (2 wheels) Transfers: Sit to/from Stand, Bed to chair/wheelchair/BSC Sit to Stand: Mod assist, +2 safety/equipment, From elevated surface   Step pivot transfers: Min guard, +2 safety/equipment       General transfer comment: Stood from EOB x1 with cues for hand placement/technique, able to stand for periods of time for pericare and LB dressing. ABle to take a few steps to get to chair with increased time and use of RW, min guard for safety/lines.    Ambulation/Gait                  Stairs            Wheelchair Mobility    Modified Rankin (Stroke Patients Only)       Balance Overall balance assessment: Needs assistance Sitting-balance support: No upper extremity supported, Feet supported Sitting balance-Leahy Scale: Good     Standing balance support: Bilateral upper extremity supported, During functional activity, Reliant on assistive device for balance Standing balance-Leahy Scale: Poor Standing balance comment: Able to stand for >5  minutes for LB dressing and pericare. Leaning on forearms for support at times. Demonstrates SLS to donn pull ups                             Pertinent Vitals/Pain Pain Assessment Pain Assessment:  Faces Faces Pain Scale: Hurts little more Pain Location: abdomen Pain Descriptors / Indicators: Sore, Operative site guarding, Discomfort Pain Intervention(s): Monitored during session, Repositioned    Home Living Family/patient expects to be discharged to:: Private residence Living Arrangements: Spouse/significant other Available Help at Discharge: Family;Available PRN/intermittently Type of Home: House Home Access: Stairs to enter Entrance Stairs-Rails: Right;Left Entrance Stairs-Number of Steps: 3 Alternate Level Stairs-Number of Steps: elevator but it does not go downt to the basement Home Layout: Multi-level;Laundry or work area in [employer]: [place] (4 wheels);Cane - single point;Toilet riser Additional Comments: 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    Prior Function Prior Level of Function : Independent/Modified Independent             Mobility Comments: Uses SPC for ambulation int the community, does not use in house though except to go to the basement ADLs Comments: independent     Hand Dominance   Dominant Hand: Right    Extremity/Trunk Assessment   Upper Extremity Assessment Upper Extremity Assessment: Defer to OT evaluation    Lower Extremity Assessment Lower Extremity Assessment: Generalized weakness (but functional)    Cervical / Trunk Assessment Cervical / Trunk Assessment: Other exceptions Cervical / Trunk Exceptions: abdominal sx  Communication   Communication: No difficulties  Cognition Arousal/Alertness: Awake/alert Behavior During Therapy: WFL for tasks assessed/performed Overall Cognitive Status: Within Functional Limits for tasks assessed                                 General Comments: super positive, motivated, fun        General Comments General comments (skin integrity, edema, etc.): 3L02 [place], SOB noted with talking and activity.    Exercises     Assessment/Plan    PT Assessment Patient  needs continued PT services  PT Problem List Decreased strength;Decreased mobility;Pain;Decreased balance;Decreased activity tolerance;Cardiopulmonary status limiting activity;Decreased skin integrity       PT Treatment Interventions Therapeutic activities;Gait training;Therapeutic exercise;Patient/family education;Balance training;Functional mobility training    PT Goals (Current goals can be found in the Care Plan section)  Acute Rehab PT Goals Patient Stated Goal: to get out of here PT Goal Formulation: With patient Time For Goal Achievement: 05/11/22 Potential to Achieve Goals: Good    Frequency Min 3X/week     Co-evaluation   Reason for Co-Treatment: For patient/therapist safety;To address functional/ADL transfers PT goals addressed during session: Mobility/safety with mobility;Balance;Proper use of DME;Strengthening/ROM OT goals addressed during session: ADL's and self-care;Strengthening/ROM;Proper use of Adaptive equipment and DME       AM-PAC PT "6 Clicks" Mobility  Outcome Measure Help needed turning from your back to your side while in a flat bed without using bedrails?: A Lot Help needed moving from lying on your back to sitting on the side of a flat bed without using bedrails?: A Lot Help needed moving to and from a bed to a chair (including a wheelchair)?: A Lot Help needed standing up from a chair using your arms (e.g., wheelchair or bedside chair)?: A Lot Help needed to walk in hospital room?: Total Help needed climbing 3-5  steps with a railing? : Total 6 Click Score: 10    End of Session Equipment Utilized During Treatment: Oxygen Activity Tolerance: Patient tolerated treatment well Patient left: in chair;with call bell/phone within reach Nurse Communication: Mobility status PT Visit Diagnosis: Pain;Muscle weakness (generalized) (M62.81);Unsteadiness on feet (R26.81) Pain - part of body:  (abdomen)    Time: [id number] PT Time Calculation (min) (ACUTE  ONLY): 38 min   Charges:   PT Evaluation $PT Eval Moderate Complexity: 1 Mod          Marisa Severin, PT, DPT Acute Rehabilitation Services Secure chat preferred Office [place] 04/27/2022, 12:39 PM

## 2022-04-27 NOTE — Progress Notes (Signed)
    2 Days Post-Op  Subjective: Patient with purewick in place so hasn't much mobilized.  No flatus yet.  Pain seems improved.   Objective: Vital signs in last 24 hours: Temp:  [97.9 F (36.6 C)-98.7 F (37.1 C)] 98.7 F (37.1 C) (02/26 0658) Pulse Rate:  [79-94] 79 (02/26 0658) Resp:  [16-21] 18 (02/26 0658) BP: (90-136)/(57-70) 117/64 (02/26 0658) SpO2:  [100 %] 100 % (02/26 0658) Last BM Date : 04/22/22  Intake/Output from previous day: 02/25 0701 - 02/26 0700 In: 882.4 [I.V.:572.4; IV Piggyback:310] Out: -  Intake/Output this shift: No intake/output data recorded.  PE: Abd: soft, hypoactive BS, no NGT output, midline incision is c/d/I with dermabond.  Appropriately tender  Lab Results:  Recent Labs    04/26/22 1108 04/27/22 0209  WBC 6.4 7.2  HGB 12.4 11.6*  HCT 37.8 37.7  PLT 192 188   BMET Recent Labs    04/26/22 1108 04/27/22 0209  NA 138 135  K 3.9 4.0  CL 107 104  CO2 24 25  GLUCOSE 111* 114*  BUN 14 12  CREATININE 0.70 0.63  CALCIUM 7.7* 7.8*   PT/INR No results for input(s): "LABPROT", "INR" in the last 72 hours. CMP     Component Value Date/Time   NA 135 04/27/2022 0209   NA 143 04/14/2021 1140   K 4.0 04/27/2022 0209   CL 104 04/27/2022 0209   CO2 25 04/27/2022 0209   GLUCOSE 114 (H) 04/27/2022 0209   BUN 12 04/27/2022 0209   BUN 14 04/14/2021 1140   CREATININE 0.63 04/27/2022 0209   CREATININE 0.80 12/07/2019 1246   CREATININE 0.81 12/28/2011 0853   CALCIUM 7.8 (L) 04/27/2022 0209   PROT 8.2 (H) 04/24/2022 1924   PROT 7.3 04/14/2021 1140   ALBUMIN 3.6 04/24/2022 1924   ALBUMIN 3.9 04/14/2021 1140   AST 34 04/24/2022 1924   AST 19 12/07/2019 1246   ALT 40 04/24/2022 1924   ALT 20 12/07/2019 1246   ALKPHOS 54 04/24/2022 1924   BILITOT 0.8 04/24/2022 1924   BILITOT 0.2 04/14/2021 1140   BILITOT 0.3 12/07/2019 1246   GFRNONAA >60 04/27/2022 0209   GFRNONAA >60 12/07/2019 1246   GFRAA 100 10/09/2019 1425   Lipase      Component Value Date/Time   LIPASE 32 04/24/2022 1924       Studies/Results: No results found.  Anti-infectives: Anti-infectives (From admission, onward)    None        Assessment/Plan POD 2, s/p open primary repair of umbilical hernia with appendectomy, Dr. Thermon Leyland for incarcerated umbilical hernia [id number] -no NGT output, but no bowel function yet -clamp NGT and allow sips of clears from the floor -DC purewick and mobilize -multi-modal pain control  FEN - NPO/sips of clears/NGT clamped/IVFs VTE - Lovenox ID - none    LOS: 2 days    Henreitta Cea , [facility] 04/27/2022, 8:46 AM Please see Amion for pager number during day hours 7:00am-4:30pm or 7:00am -11:30am on weekends

## 2022-04-28 ENCOUNTER — Inpatient Hospital Stay (HOSPITAL_COMMUNITY): Payer: Medicare Other

## 2022-04-28 DIAGNOSIS — K56609 Unspecified intestinal obstruction, unspecified as to partial versus complete obstruction: Secondary | ICD-10-CM | POA: Diagnosis not present

## 2022-04-28 DIAGNOSIS — E7849 Other hyperlipidemia: Secondary | ICD-10-CM | POA: Diagnosis not present

## 2022-04-28 DIAGNOSIS — R0600 Dyspnea, unspecified: Secondary | ICD-10-CM

## 2022-04-28 DIAGNOSIS — K436 Other and unspecified ventral hernia with obstruction, without gangrene: Secondary | ICD-10-CM | POA: Diagnosis not present

## 2022-04-28 DIAGNOSIS — I2510 Atherosclerotic heart disease of native coronary artery without angina pectoris: Secondary | ICD-10-CM | POA: Diagnosis not present

## 2022-04-28 LAB — BASIC METABOLIC PANEL
Anion gap: 11 (ref 5–15)
BUN: 8 mg/dL (ref 8–23)
CO2: 24 mmol/L (ref 22–32)
Calcium: 8 mg/dL — ABNORMAL LOW (ref 8.9–10.3)
Chloride: 102 mmol/L (ref 98–111)
Creatinine, Ser: 0.58 mg/dL (ref 0.44–1.00)
GFR, Estimated: >60 mL/min (ref >60)
Glucose, Bld: 118 mg/dL — ABNORMAL HIGH (ref 70–99)
Potassium: 3.8 mmol/L (ref 3.5–5.1)
Sodium: 137 mmol/L (ref 135–145)

## 2022-04-28 LAB — CBC
HCT: 38.6 % (ref 36.0–46.0)
Hemoglobin: 12 g/dL (ref 12.0–15.0)
MCH: 30.9 pg (ref 26.0–34.0)
MCHC: 31.1 g/dL (ref 30.0–36.0)
MCV: 99.5 fL (ref 80.0–100.0)
Platelets: 213 10*3/uL (ref 150–400)
RBC: 3.88 MIL/uL (ref 3.87–5.11)
RDW: 15.2 % (ref 11.5–15.5)
WBC: 7.7 10*3/uL (ref 4.0–10.5)
nRBC: 0 % (ref 0.0–0.2)

## 2022-04-28 LAB — SURGICAL PATHOLOGY

## 2022-04-28 LAB — BRAIN NATRIURETIC PEPTIDE: B Natriuretic Peptide: 183.8 pg/mL — ABNORMAL HIGH (ref 0.0–100.0)

## 2022-04-28 MED ORDER — FUROSEMIDE 10 MG/ML IJ SOLN
20.0000 mg | Freq: Once | INTRAMUSCULAR | Status: AC
  Administered 2022-04-28: 20 mg via INTRAVENOUS
  Filled 2022-04-28: qty 2

## 2022-04-28 MED ORDER — SODIUM CHLORIDE 0.9 % IV SOLN
2.0000 g | INTRAVENOUS | Status: DC
  Administered 2022-04-28 – 2022-04-29 (×2): 2 g via INTRAVENOUS
  Filled 2022-04-28 (×2): qty 20

## 2022-04-28 MED ORDER — HYDROMORPHONE HCL 1 MG/ML IJ SOLN
0.5000 mg | INTRAMUSCULAR | Status: DC | PRN
  Administered 2022-04-28: 0.5 mg via INTRAVENOUS
  Filled 2022-04-28 (×2): qty 0.5

## 2022-04-28 MED ORDER — METRONIDAZOLE 500 MG/100ML IV SOLN
500.0000 mg | Freq: Two times a day (BID) | INTRAVENOUS | Status: DC
  Administered 2022-04-28 – 2022-04-30 (×4): 500 mg via INTRAVENOUS
  Filled 2022-04-28 (×4): qty 100

## 2022-04-28 NOTE — Care Management Important Message (Signed)
Important Message  Patient Details  Name: Heather [name] MRN: [id number] Date of Birth: Mar 20, 1945   Medicare Important Message Given:  Yes     Hannah Beat 04/28/2022, 12:08 PM

## 2022-04-28 NOTE — Progress Notes (Signed)
    3 Days Post-Op  Subjective: Small amount of flatus maybe once or twice.  No other bowel function, but no nausea with NGT clamped and sips of liquids yesterday.  Got up once yesterday.  Purewick replaced overnight.  Seems a bit SOB this am.   Objective: Vital signs in last 24 hours: Temp:  [98.4 F (36.9 C)-99 F (37.2 C)] 99 F (37.2 C) (02/27 0500) Pulse Rate:  [90-101] 97 (02/26 2258) Resp:  [16-19] 19 (02/27 0500) BP: (116-131)/(63-72) 131/72 (02/27 0500) SpO2:  [92 %-100 %] 98 % (02/27 0500) Last BM Date : 04/22/22  Intake/Output from previous day: No intake/output data recorded. Intake/Output this shift: No intake/output data recorded.  PE: Gen: NAD Heart: tachy Lungs: CTAB, but slight tachypnea Abd: soft, hypoactive BS, no NGT residual, midline incision is c/d/I with dermabond.  Appropriately tender  Lab Results:  Recent Labs    04/27/22 0209 04/28/22 0407  WBC 7.2 7.7  HGB 11.6* 12.0  HCT 37.7 38.6  PLT 188 213   BMET Recent Labs    04/27/22 0209 04/28/22 0407  NA 135 137  K 4.0 3.8  CL 104 102  CO2 25 24  GLUCOSE 114* 118*  BUN 12 8  CREATININE 0.63 0.58  CALCIUM 7.8* 8.0*   PT/INR No results for input(s): "LABPROT", "INR" in the last 72 hours. CMP     Component Value Date/Time   NA 137 04/28/2022 0407   NA 143 04/14/2021 1140   K 3.8 04/28/2022 0407   CL 102 04/28/2022 0407   CO2 24 04/28/2022 0407   GLUCOSE 118 (H) 04/28/2022 0407   BUN 8 04/28/2022 0407   BUN 14 04/14/2021 1140   CREATININE 0.58 04/28/2022 0407   CREATININE 0.80 12/07/2019 1246   CREATININE 0.81 12/28/2011 0853   CALCIUM 8.0 (L) 04/28/2022 0407   PROT 8.2 (H) 04/24/2022 1924   PROT 7.3 04/14/2021 1140   ALBUMIN 3.6 04/24/2022 1924   ALBUMIN 3.9 04/14/2021 1140   AST 34 04/24/2022 1924   AST 19 12/07/2019 1246   ALT 40 04/24/2022 1924   ALT 20 12/07/2019 1246   ALKPHOS 54 04/24/2022 1924   BILITOT 0.8 04/24/2022 1924   BILITOT 0.2 04/14/2021 1140    BILITOT 0.3 12/07/2019 1246   GFRNONAA >60 04/28/2022 0407   GFRNONAA >60 12/07/2019 1246   GFRAA 100 10/09/2019 1425   Lipase     Component Value Date/Time   LIPASE 32 04/24/2022 1924       Studies/Results: No results found.  Anti-infectives: Anti-infectives (From admission, onward)    None        Assessment/Plan POD 3, s/p open primary repair of umbilical hernia with appendectomy, Dr. Thermon Leyland for incarcerated umbilical hernia [id number] -no NGT residual, but significant bowel function yet either.  Will allow clear liquids today with NGT still in place given large HH and wanting to go slow given no significant bowel function yet.  Replacing an NGT may be difficult in setting of large HH. -DC purewick and mobilize -multi-modal pain control  FEN - clears/NGT clamped/IVFs VTE - Lovenox ID - none  Tachypnea - takes lasix at home.  Restart, CXR, per medicine Tachycardia - around 110 - EKG per medicine CAD CHF HLD   LOS: 3 days    Henreitta Cea , [facility] 04/28/2022, 8:40 AM Please see Amion for pager number during day hours 7:00am-4:30pm or 7:00am -11:30am on weekends

## 2022-04-28 NOTE — Progress Notes (Signed)
Physical Therapy Treatment Patient Details Name: Heather [name] MRN: [id number] DOB: [birth year] Today's Date: 04/28/2022   History of Present Illness Patient is a 77 y/o female who presents on 2/23 with abdominal pain, emesis and weakness. Found to have SBO secondary to umbilical hernia now s/p open incarcerated umbilical hernia repair and appendectomy 2/24. PMH includes DM, obesity, UI, peripheral neuropathy, CAD, CHF, OSA on CPAP.    PT Comments    Patient progressing well towards PT goals. Session focused on ambulation progression and functional mobility. Requires Mod A to stand from chair and Min guard progressing to supervision for gait training with use of RW for support. Noted to have SOB at rest with talking and with mobility however SP02 remained >95% on RA. HR up to 120s bpm with activity. Highly motivated to mobilize. Recommend increasing activity/walking while in the hospital to improve respiration and endurance. Will follow.   Recommendations for follow up therapy are one component of a multi-disciplinary discharge planning process, led by the attending physician.  Recommendations may be updated based on patient status, additional functional criteria and insurance authorization.  Follow Up Recommendations  Home health PT     Assistance Recommended at Discharge Intermittent Supervision/Assistance  Patient can return home with the following A little help with walking and/or transfers;A little help with bathing/dressing/bathroom;Help with stairs or ramp for entrance;Assistance with cooking/housework;Assist for transportation   Equipment Recommendations  None recommended by PT    Recommendations for Other Services       Precautions / Restrictions Precautions Precautions: Fall;Other (comment) Precaution Comments: NGT clamped Restrictions Weight Bearing Restrictions: No     Mobility  Bed Mobility               General bed mobility comments: Up in chair upon PT  arrival.    Transfers Overall transfer level: Needs assistance Equipment used: Rolling walker (2 wheels) Transfers: Sit to/from Stand Sit to Stand: Mod assist           General transfer comment: Mod A to power to standing from chair x1 with cues for hand placement, slow to rise.    Ambulation/Gait Ambulation/Gait assistance: Min guard, Supervision Gait Distance (Feet): 150 Feet Assistive device: Rolling walker (2 wheels) Gait Pattern/deviations: Step-through pattern, Decreased stride length, Trunk flexed Gait velocity: decreased     General Gait Details: Slow, steady gait with use of RW for support, 2-3/4 DOE. Sp02 remained >95% on RA, HR up 120s bpm. 1 standing rest break.   Stairs             Wheelchair Mobility    Modified Rankin (Stroke Patients Only)       Balance Overall balance assessment: Needs assistance Sitting-balance support: No upper extremity supported, Feet supported Sitting balance-Leahy Scale: Good     Standing balance support: During functional activity Standing balance-Leahy Scale: Fair Standing balance comment: Able to stand with 1 UE support but needs assist to donn pull ups                            Cognition Arousal/Alertness: Awake/alert Behavior During Therapy: WFL for tasks assessed/performed Overall Cognitive Status: Within Functional Limits for tasks assessed                                          Exercises      General Comments  General comments (skin integrity, edema, etc.): Sp02 remained >95% on RA, HR up to 120s bpm with activity.      Pertinent Vitals/Pain Pain Assessment Pain Assessment: No/denies pain    Home Living                          Prior Function            PT Goals (current goals can now be found in the care plan section) Progress towards PT goals: Progressing toward goals    Frequency    Min 3X/week      PT Plan Current plan remains  appropriate    Co-evaluation              AM-PAC PT "6 Clicks" Mobility   Outcome Measure  Help needed turning from your back to your side while in a flat bed without using bedrails?: A Little Help needed moving from lying on your back to sitting on the side of a flat bed without using bedrails?: A Lot Help needed moving to and from a bed to a chair (including a wheelchair)?: A Lot Help needed standing up from a chair using your arms (e.g., wheelchair or bedside chair)?: A Lot Help needed to walk in hospital room?: A Little Help needed climbing 3-5 steps with a railing? : A Little 6 Click Score: 15    End of Session Equipment Utilized During Treatment: Gait belt Activity Tolerance: Patient tolerated treatment well Patient left: in chair;with call bell/phone within reach Nurse Communication: Mobility status PT Visit Diagnosis: Muscle weakness (generalized) (M62.81);Unsteadiness on feet (R26.81)     Time: 1401-1440 PT Time Calculation (min) (ACUTE ONLY): 39 min  Charges:  $Gait Training: 23-37 mins $Therapeutic Activity: 8-22 mins                     Marisa Severin, PT, DPT Acute Rehabilitation Services Secure chat preferred Office [place] 04/28/2022, 3:52 PM

## 2022-04-28 NOTE — Plan of Care (Signed)
  Problem: Education: Goal: Knowledge of General Education information will improve Description: Including pain rating scale, medication(s)/side effects and non-pharmacologic comfort measures Outcome: Progressing   Problem: Clinical Measurements: Goal: Ability to maintain clinical measurements within normal limits will improve Outcome: Progressing Goal: Diagnostic test results will improve Outcome: Progressing   

## 2022-04-28 NOTE — Progress Notes (Addendum)
[facility]                                                                              Heather [name], is a 77 y.o. female, DOB - 1945/04/04, [id number] Admit date - 04/24/2022    Outpatient Primary MD for the patient is Quay Burow, [name], MD  LOS - 3  days  Chief Complaint  Patient presents with   Abdominal Pain   generalized weakness   Emesis       Brief summary   Patient is a 77 year old female with HTN, OSA on CPAP, prediabetes, HLP, CAD, NASH presented with abdominal pain.  Per patient she had a full meal a week ago on Saturday, 2/17 night.  Next day on Sunday she developed generalized abdominal pain that progressively became worse.  Initially it slightly improved however has been continuous since then with nausea and vomiting for the first 3 days.  No BM for the last 2 to 3 days.  Her abdomen was distended. In ED, CT abdomen pelvis noted distal SBO with single point of transition within the umbilical hernia sac just proximal to the ileocecal valve.  Approximately small bowel is diffusely dilated and fluid-filled. Surgery was consulted and patient was admitted for further workup.    Assessment & Plan    Principal Problem:   SBO (small bowel obstruction) (HCC) with a ventral hernia with obstruction -Presented with abdominal pain, nausea and vomiting, poor p.o. intake, incarcerated umbilical hernia -underwent open primary incarcerated umbilical hernia repair and appendectomy on 2/24, postop day # 3 -Management per surgery, NGT was clamped and patient allowed clears today -Increase mobilization  Active Problems: Dyspnea likely due to fluid overload and aspiration -On exam, noted to be somewhat short of breath, tachycardiac, low-grade temp 99.1 F, with bibasilar crackles per patient, takes Lasix 40 mg at home -NGT clamped, starting on clears today, will DC IV fluids.   -EKG with 111, sinus tachycardia. -BNP 183.8, chest x-ray showed bibasilar  consolidated, cardiomegaly with the right-sided pleural effusion. NG tube appears to be in large hiatal hernia on the right (d/w surgery, no need to reposition) -IV fluids DC'd, Lasix 20 mg IV x 1, will place on IV Unasyn due to likely aspiration  Hiatal hernia -Continue IV Protonix    CAD S/P percutaneous coronary angioplasty -Plavix, Lasix on hold. -Will resume Plavix once cleared by surgery    Hyperlipidemia -Continue to hold Crestor, Zetia until consistently tolerating diet  Essential hypertension - BP stable, discontinue IVF    Hyponatremia -Resolved  Mild acute kidney injury -Likely due to #1, poor p.o. intake, nausea and vomiting in the last 1 week -Creatinine 1.09 on admission, baseline 0.8, trended up to 1.1 Resolved, creatinine 0.5  ObesityBMI 36.0-36.9,adult Estimated body mass index is 35.55 kg/m as calculated from the following:   Height as of this encounter: 5' 4.02" (1.626 m).   Weight as of this encounter: 94 kg.  Code Status: Full code DVT Prophylaxis:  enoxaparin (LOVENOX) injection 40 mg Start: 04/25/22 1000   Level of Care: Level of care: Telemetry Medical Family Communication: Updated patient Disposition Plan:  Remains inpatient appropriate:    Procedures:   Open primary incarcerated umbilical hernia repair and appendectomy on 04/25/2022  Consultants: Surgery   Antimicrobials: None  Medications  acetaminophen  1,000 mg Oral Q6H   enoxaparin (LOVENOX) injection  40 mg Subcutaneous Daily   furosemide  20 mg Intravenous Once   pantoprazole (PROTONIX) IV  40 mg Intravenous Daily      Subjective:   Heather [name] was seen and examined today.  Still no BM or flatus.  NGT clamped.  Somewhat short of breath and tachycardiac today.   Objective:   Vitals:   04/27/22 2110 04/27/22 2258 04/28/22 0500 04/28/22 0847  BP: 120/68  131/72 132/72  Pulse: (!) 101 97  84  Resp: '17 19 19 18  '$ Temp: 98.7 F (37.1 C)  99 F (37.2 C) 99.1 F (37.3  C)  TempSrc: Oral  Oral Oral  SpO2: 100% 97% 98% 94%  Weight:      Height:        Intake/Output Summary (Last 24 hours) at 04/28/2022 1313 Last data filed at 04/28/2022 1034 Gross per 24 hour  Intake 100 ml  Output --  Net 100 ml     Wt Readings from Last 3 Encounters:  04/25/22 94 kg  04/23/22 93.9 kg  04/14/22 97.1 kg    Physical Exam General: Alert and oriented x 3, NAD, visibly somewhat short of breath Cardiovascular: S1 S2 clear, RRR.  Tachycardia Respiratory: Bibasilar crackles Gastrointestinal: Soft, hypoactive bowel sounds, midline incision CDI Ext: no pedal edema bilaterally Neuro: no new deficits Psych: Normal affect    Data Reviewed:  I have personally reviewed following labs    CBC Lab Results  Component Value Date   WBC 7.7 04/28/2022   RBC 3.88 04/28/2022   HGB 12.0 04/28/2022   HCT 38.6 04/28/2022   MCV 99.5 04/28/2022   MCH 30.9 04/28/2022   PLT 213 04/28/2022   MCHC 31.1 04/28/2022   RDW 15.2 04/28/2022   LYMPHSABS 0.6 (L) 04/25/2022   MONOABS 0.8 04/25/2022   EOSABS 0.0 04/25/2022   BASOSABS 0.0 [id number]     Last metabolic panel Lab Results  Component Value Date   NA 137 04/28/2022   K 3.8 04/28/2022   CL 102 04/28/2022   CO2 24 04/28/2022   BUN 8 04/28/2022   CREATININE 0.58 04/28/2022   GLUCOSE 118 (H) 04/28/2022   GFRNONAA >60 04/28/2022   GFRAA 100 10/09/2019   CALCIUM 8.0 (L) 04/28/2022   PROT 8.2 (H) 04/24/2022   ALBUMIN 3.6 04/24/2022   LABGLOB 3.4 04/14/2021   AGRATIO 1.1 (L) 04/14/2021   BILITOT 0.8 04/24/2022   ALKPHOS 54 04/24/2022   AST 34 04/24/2022   ALT 40 04/24/2022   ANIONGAP 11 04/28/2022    CBG (last 3)  No results for input(s): "GLUCAP" in the last 72 hours.     Coagulation Profile: No results for input(s): "INR", "PROTIME" in the last 168 hours.   Radiology Studies: I have personally reviewed the imaging studies  DG CHEST PORT 1 VIEW  Result Date: 04/28/2022 CLINICAL DATA:  Dyspnea EXAM:  PORTABLE CHEST 1 VIEW COMPARISON:  02/15/2015 FINDINGS: Large right-sided hiatal hernia identified with NG tube entering the hernia. Cardiac silhouette is enlarged. There is bilateral basilar consolidation more severe on the right. No pneumothorax identified. There may be right-sided pleural effusion. IMPRESSION: Bibasilar consolidation. Enlarged cardiac silhouette. Possible right-sided pleural effusion. Unusual course of the NG tube which appears to enter large hiatal hernia on the right.  Electronically Signed   By: Sammie Bench M.D.   On: 04/28/2022 08:50       Imari Reen M.D. [facility] 04/28/2022, 1:13 PM  Available via Epic secure chat 7am-7pm After 7 pm, please refer to night coverage provider listed on amion.

## 2022-04-28 NOTE — TOC Initial Note (Signed)
Transition of Care [facility]) - Initial/Assessment Note    Patient Details  Name: Heather [name] MRN: [id number] Date of Birth: [birth year]  [facility] CM/SW Contact:    Marilu Favre, RN Phone Number: 04/28/2022, 10:43 AM  Clinical Narrative:                  Patient from home with husband . Confirmed face sheet information. Best number to call is her home number.   Patient has a walker and 3 in 1 at home.   Discussed home health services. Patient in agreement. Patient has used [facility] in the past and would like them again. Claiborne Billings with [facility] accepted referral.  Expected Discharge Plan: [place] Barriers to Discharge: Continued Medical Work up   Patient Goals and CMS Choice Patient states their goals for this hospitalization and ongoing recovery are:: to return to home CMS Medicare.gov Compare Post Acute Care list provided to:: Patient Choice offered to / list presented to : Patient [place] ownership interest in [facility].provided to:: Patient    Expected Discharge Plan and Services   Discharge Planning Services: CM Consult Post Acute Care Choice: [place] arrangements for the past 2 months: Single Family Home                 DME Arranged: N/A         HH Arranged: RN, PT, OT HH Agency: [place] Date [place]: 04/28/22 Time [place]: 1042 Representative spoke with at [place]: Claiborne Billings  Prior Living Arrangements/Services Living arrangements for the past 2 months: [place] with:: Spouse Patient language and need for interpreter reviewed:: Yes Do you feel safe going back to the place where you live?: Yes      Need for Family Participation in Patient Care: Yes (Comment) Care giver support system in place?: Yes (comment) Current home services: DME Criminal Activity/Legal Involvement Pertinent to Current Situation/Hospitalization: No - Comment as  needed  Activities of Daily Living      Permission Sought/Granted   Permission granted to share information with : No              Emotional Assessment Appearance:: Appears stated age Attitude/Demeanor/Rapport: Engaged Affect (typically observed): Accepting Orientation: : Oriented to Self, Oriented to Place, Oriented to  Time, Oriented to Situation Alcohol / Substance Use: Not Applicable Psych Involvement: No (comment)  Admission diagnosis:  SBO (small bowel obstruction) ([place]) [K56.609] Ventral hernia with obstruction and without gangrene [K43.6] Patient Active Problem List   Diagnosis Date Noted   SBO (small bowel obstruction) ([place]) 04/25/2022   Hyperlipidemia 04/25/2022   Obstructive sleep apnea treated with BiPAP 04/25/2022   Dependence on nocturnal oxygen therapy 04/25/2022   Hyponatremia 04/25/2022   Generalized abdominal pain 04/23/2022   BMI 36.0-36.9,adult 04/08/2022   Obesity, Beginning BMI 44.60 04/08/2022   Persistent cough for 3 weeks or longer 02/13/2022   Type 2 diabetes mellitus with other specified complication ([place]) [id number]   Traumatic tear of supraspinatus tendon of right shoulder 11/24/2021   Right arm pain 11/18/2021   Positive self-administered antigen test for COVID-19 11/09/2021   Stress 10/16/2021   Status post left knee replacement 03/12/2021   Status post total left knee replacement 03/11/2021   Chronic pain 01/06/2021   Lumbar radiculopathy 07/04/2020   Bilateral sciatica 07/04/2020   Pain in both hands 07/04/2020   Hyperlipidemia associated with type 2 diabetes mellitus ([place]) 05/01/2020  Abnormal blood level of copper 03/14/2020   Leukopenia 03/14/2020   Obesity (BMI 30-39.9) 11/13/2019   Chronic pain of right thumb 10/11/2019   Vitamin D deficiency 07/27/2019   Memory loss 04/27/2019   Mass of left wrist 01/18/2019   Ventral hernia without obstruction or gangrene 01/18/2019   Mass of right thigh 09/13/2018   Depression  08/02/2018   Sleeps in sitting position due to orthopnea 07/13/2018   BiPAP (biphasic positive airway pressure) dependence 05/24/2018   Goiter 04/15/2017   Urinary incontinence 04/15/2017   Type 2 diabetes mellitus without complication, without long-term current use of insulin ([place]) 02/15/2017   Bilateral leg cramps 01/05/2017   Peripheral neuropathy 05/27/2016   Elevated sed rate 03/05/2016   Muscle spasm of both lower legs 02/12/2016   Edema of both ankles 02/12/2016   Iron deficiency anemia 02/12/2016   Hypokalemia 02/12/2016   Pernicious anemia 02/12/2016   Degenerative disc disease, lumbar 04/09/2015   Degenerative arthritis of knee, bilateral 03/13/2015   Chronic low back pain 03/13/2015   CAD S/P percutaneous coronary angioplasty 10/31/2013   Incidental lung nodule, > 86m and < 861m Left lower lobe 51m151m June 2014, April 2015 unchanged 06/25/2013   OSA on CPAP 06/21/2013   Myalgia and myositis 05/02/2013   Muscle ache 04/03/2013   Class 3 severe obesity with serious comorbidity and body mass index (BMI) of 40.0 to 44.9 in adult ([place]CCAvon1/25/2014   Large hiatal hernia 08/24/2012    LLL nodule 6mm74mJune 2014, described in 2003 07/13/2012   Asthma, chronic 06/29/2012   Migraine without aura 03/11/2012   SOB (shortness of breath) 12/28/2011   Hx of fibroids 08/03/2011   Knee pain, left 12/25/2010   NECK PAIN 12/03/2009   TINNITUS 11/22/2007   HEARING LOSS 11/22/2007   Chronic pain of both knees 11/22/2007   Gastroesophageal reflux disease without esophagitis 07/20/2007   NASH (nonalcoholic steatohepatitis) 07/20/2007   ANXIETY 10/22/2006   Essential hypertension 10/22/2006   Allergic rhinitis 10/22/2006   Osteoarthritis 10/22/2006   PCP:  [facility]   [facility] [id number]: [phone]: 800-[place] -  [id number] [place] [id number]: [phone]: [phone] Social Determinants of Health (SDOH) Social History: SDOH Screenings   Food Insecurity: No Food Insecurity (10/24/2021)  Housing: Low Risk  (11/27/2020)  Transportation Needs: No Transportation Needs (10/24/2021)  Depression (PHQ2-9): Low Risk  (04/23/2022)  Financial Resource Strain: Low Risk  (11/27/2020)  Physical Activity: Sufficiently Active (11/24/2018)  Social Connections: Unknown (11/27/2020)  Stress: No Stress Concern Present (11/27/2020)  Tobacco Use: Medium Risk (04/26/2022)   SDOH Interventions:     Readmission Risk Interventions     No data to display

## 2022-04-29 DIAGNOSIS — K436 Other and unspecified ventral hernia with obstruction, without gangrene: Secondary | ICD-10-CM | POA: Diagnosis not present

## 2022-04-29 DIAGNOSIS — I2510 Atherosclerotic heart disease of native coronary artery without angina pectoris: Secondary | ICD-10-CM | POA: Diagnosis not present

## 2022-04-29 DIAGNOSIS — Z6836 Body mass index (BMI) 36.0-36.9, adult: Secondary | ICD-10-CM

## 2022-04-29 DIAGNOSIS — K56609 Unspecified intestinal obstruction, unspecified as to partial versus complete obstruction: Secondary | ICD-10-CM | POA: Diagnosis not present

## 2022-04-29 LAB — BASIC METABOLIC PANEL
Anion gap: 10 (ref 5–15)
Anion gap: 11 (ref 5–15)
BUN: 8 mg/dL (ref 8–23)
BUN: 8 mg/dL (ref 8–23)
CO2: 27 mmol/L (ref 22–32)
CO2: 25 mmol/L (ref 22–32)
Calcium: 8 mg/dL — ABNORMAL LOW (ref 8.9–10.3)
Calcium: 7.8 mg/dL — ABNORMAL LOW (ref 8.9–10.3)
Chloride: 101 mmol/L (ref 98–111)
Chloride: 102 mmol/L (ref 98–111)
Creatinine, Ser: 0.58 mg/dL (ref 0.44–1.00)
Creatinine, Ser: 0.54 mg/dL (ref 0.44–1.00)
GFR, Estimated: >60 mL/min (ref >60)
GFR, Estimated: >60 mL/min (ref >60)
Glucose, Bld: 111 mg/dL — ABNORMAL HIGH (ref 70–99)
Glucose, Bld: 112 mg/dL — ABNORMAL HIGH (ref 70–99)
Potassium: 3.4 mmol/L — ABNORMAL LOW (ref 3.5–5.1)
Potassium: 3.2 mmol/L — ABNORMAL LOW (ref 3.5–5.1)
Sodium: 138 mmol/L (ref 135–145)
Sodium: 138 mmol/L (ref 135–145)

## 2022-04-29 LAB — CBC
HCT: 41.1 % (ref 36.0–46.0)
Hemoglobin: 13 g/dL (ref 12.0–15.0)
MCH: 30.7 pg (ref 26.0–34.0)
MCHC: 31.6 g/dL (ref 30.0–36.0)
MCV: 97.2 fL (ref 80.0–100.0)
Platelets: 221 10*3/uL (ref 150–400)
RBC: 4.23 MIL/uL (ref 3.87–5.11)
RDW: 15.3 % (ref 11.5–15.5)
WBC: 7.7 10*3/uL (ref 4.0–10.5)
nRBC: 0 % (ref 0.0–0.2)

## 2022-04-29 LAB — MAGNESIUM
Magnesium: 2.1 mg/dL (ref 1.7–2.4)
Magnesium: 2.1 mg/dL (ref 1.7–2.4)

## 2022-04-29 LAB — PHOSPHORUS
Phosphorus: 1.8 mg/dL — ABNORMAL LOW (ref 2.5–4.6)
Phosphorus: 1.5 mg/dL — ABNORMAL LOW (ref 2.5–4.6)

## 2022-04-29 MED ORDER — EZETIMIBE 10 MG PO TABS
10.0000 mg | ORAL_TABLET | Freq: Every day | ORAL | Status: DC
  Administered 2022-04-29 – 2022-04-30 (×2): 10 mg via ORAL
  Filled 2022-04-29 (×2): qty 1

## 2022-04-29 MED ORDER — FUROSEMIDE 20 MG PO TABS
20.0000 mg | ORAL_TABLET | Freq: Every day | ORAL | Status: DC
  Administered 2022-04-29 – 2022-04-30 (×2): 20 mg via ORAL
  Filled 2022-04-29 (×2): qty 1

## 2022-04-29 MED ORDER — OXYCODONE HCL 5 MG PO TABS
5.0000 mg | ORAL_TABLET | ORAL | Status: DC | PRN
  Administered 2022-04-29 – 2022-04-30 (×3): 10 mg via ORAL
  Filled 2022-04-29 (×4): qty 2

## 2022-04-29 MED ORDER — CLOPIDOGREL BISULFATE 75 MG PO TABS
75.0000 mg | ORAL_TABLET | Freq: Every day | ORAL | Status: DC
  Administered 2022-04-29 – 2022-04-30 (×2): 75 mg via ORAL
  Filled 2022-04-29 (×2): qty 1

## 2022-04-29 MED ORDER — GABAPENTIN 300 MG PO CAPS
300.0000 mg | ORAL_CAPSULE | Freq: Every day | ORAL | Status: DC
  Administered 2022-04-29: 300 mg via ORAL
  Filled 2022-04-29: qty 1

## 2022-04-29 MED ORDER — PANTOPRAZOLE SODIUM 40 MG PO TBEC
40.0000 mg | DELAYED_RELEASE_TABLET | Freq: Every day | ORAL | Status: DC
  Administered 2022-04-30: 40 mg via ORAL
  Filled 2022-04-29: qty 1

## 2022-04-29 MED ORDER — POTASSIUM PHOSPHATES 15 MMOLE/5ML IV SOLN
30.0000 mmol | Freq: Once | INTRAVENOUS | Status: AC
  Administered 2022-04-29: 30 mmol via INTRAVENOUS
  Filled 2022-04-29: qty 10

## 2022-04-29 MED ORDER — ROSUVASTATIN CALCIUM 20 MG PO TABS
40.0000 mg | ORAL_TABLET | Freq: Every day | ORAL | Status: DC
  Administered 2022-04-29: 40 mg via ORAL
  Filled 2022-04-29: qty 2

## 2022-04-29 MED ORDER — METHOCARBAMOL 1000 MG/10ML IJ SOLN: 500.0000 mg | Freq: Three times a day (TID) | INTRAVENOUS | Status: DC | PRN

## 2022-04-29 NOTE — Plan of Care (Signed)
  Problem: Education: Goal: Knowledge of General Education information will improve Description Including pain rating scale, medication(s)/side effects and non-pharmacologic comfort measures Outcome: Progressing   Problem: Health Behavior/Discharge Planning: Goal: Ability to manage health-related needs will improve Outcome: Progressing   

## 2022-04-29 NOTE — Progress Notes (Signed)
4 Days Post-Op  Subjective: Feels well.  Had a BM and flatus.  No nausea with NGT clamped and tolerating CLD   Objective: Vital signs in last 24 hours: Temp:  [98.1 F (36.7 C)-98.9 F (37.2 C)] 98.9 F (37.2 C) (02/28 0324) Pulse Rate:  [101-110] 102 (02/28 0324) Resp:  [18] 18 (02/28 0324) BP: (127-135)/(76-88) 135/79 (02/28 0324) SpO2:  [96 %-100 %] 96 % (02/28 0324) Last BM Date : 04/22/22  Intake/Output from previous day: 02/27 0701 - 02/28 0700 In: 750 [P.O.:220; IV Piggyback:530] Out: -  Intake/Output this shift: No intake/output data recorded.  PE: Gen: NAD Abd: soft, + BS, NGT clamped, midline incision is c/d/I with dermabond.  Appropriately tender  Lab Results:  Recent Labs    04/28/22 0407 04/29/22 0634  WBC 7.7 7.7  HGB 12.0 13.0  HCT 38.6 41.1  PLT 213 221   BMET Recent Labs    04/28/22 2358 04/29/22 0634  NA 138 138  K 3.4* 3.2*  CL 101 102  CO2 27 25  GLUCOSE 111* 112*  BUN 8 8  CREATININE 0.58 0.54  CALCIUM 8.0* 7.8*   PT/INR No results for input(s): "LABPROT", "INR" in the last 72 hours. CMP     Component Value Date/Time   NA 138 04/29/2022 0634   NA 143 04/14/2021 1140   K 3.2 (L) 04/29/2022 0634   CL 102 04/29/2022 0634   CO2 25 04/29/2022 0634   GLUCOSE 112 (H) 04/29/2022 0634   BUN 8 04/29/2022 0634   BUN 14 04/14/2021 1140   CREATININE 0.54 04/29/2022 0634   CREATININE 0.80 12/07/2019 1246   CREATININE 0.81 12/28/2011 0853   CALCIUM 7.8 (L) 04/29/2022 0634   PROT 8.2 (H) 04/24/2022 1924   PROT 7.3 04/14/2021 1140   ALBUMIN 3.6 04/24/2022 1924   ALBUMIN 3.9 04/14/2021 1140   AST 34 04/24/2022 1924   AST 19 12/07/2019 1246   ALT 40 04/24/2022 1924   ALT 20 12/07/2019 1246   ALKPHOS 54 04/24/2022 1924   BILITOT 0.8 04/24/2022 1924   BILITOT 0.2 04/14/2021 1140   BILITOT 0.3 12/07/2019 1246   GFRNONAA >60 04/29/2022 0634   GFRNONAA >60 12/07/2019 1246   GFRAA 100 10/09/2019 1425   Lipase     Component Value  Date/Time   LIPASE 32 04/24/2022 1924       Studies/Results: DG CHEST PORT 1 VIEW  Result Date: 04/28/2022 CLINICAL DATA:  Dyspnea EXAM: PORTABLE CHEST 1 VIEW COMPARISON:  02/15/2015 FINDINGS: Large right-sided hiatal hernia identified with NG tube entering the hernia. Cardiac silhouette is enlarged. There is bilateral basilar consolidation more severe on the right. No pneumothorax identified. There may be right-sided pleural effusion. IMPRESSION: Bibasilar consolidation. Enlarged cardiac silhouette. Possible right-sided pleural effusion. Unusual course of the NG tube which appears to enter large hiatal hernia on the right. Electronically Signed   By: Sammie Bench M.D.   On: 04/28/2022 08:50    Anti-infectives: Anti-infectives (From admission, onward)    Start     Dose/Rate Route Frequency Ordered Stop   04/28/22 1500  cefTRIAXone (ROCEPHIN) 2 g in sodium chloride 0.9 % 100 mL IVPB        2 g 200 mL/hr over 30 Minutes Intravenous Every 24 hours 04/28/22 1402     04/28/22 1500  metroNIDAZOLE (FLAGYL) IVPB 500 mg        500 mg 100 mL/hr over 60 Minutes Intravenous Every 12 hours 04/28/22 1402  Assessment/Plan POD 4, s/p open primary repair of umbilical hernia with appendectomy, Dr. Thermon Leyland for incarcerated umbilical hernia [id number] -DC NGT -adv to FLD -DC purewick and mobilize -multi-modal pain control  FEN - FLD/IVFs per TRH, diuresing VTE - Lovenox ID - none  Tachypnea -  per medicine Tachycardia -  per medicine CAD CHF HLD   LOS: 4 days    Henreitta Cea , [facility] Surgery 04/29/2022, 9:34 AM Please see Amion for pager number during day hours 7:00am-4:30pm or 7:00am -11:30am on weekends

## 2022-04-29 NOTE — Progress Notes (Signed)
Patient placed on CPAP for HS using FFM as per patient's home regimen. Auto titration mode used with min 5, max 16 cmH20. Room air used. Patient rr 105, patient is familiar with equipment and procedure.

## 2022-04-29 NOTE — Progress Notes (Signed)
Occupational Therapy Treatment Patient Details Name: JYLL MONROE MRN: [id number] DOB: 07-Sep-1945 Today's Date: 04/29/2022   History of present illness Patient is a 77 y/o female who presents on 2/23 with abdominal pain, emesis and weakness. Found to have SBO secondary to umbilical hernia now s/p open incarcerated umbilical hernia repair and appendectomy 2/24. PMH includes DM, obesity, UI, peripheral neuropathy, CAD, CHF, OSA on CPAP.   OT comments  Pt making good progress with functional goals. Initiated ADL A/E education with handout provided. Energy conservation training initiated with handouts provided. Pt very pleasant and cooperative. OT will continue to follow acutely to maximize level of function and safety   Recommendations for follow up therapy are one component of a multi-disciplinary discharge planning process, led by the attending physician.  Recommendations may be updated based on patient status, additional functional criteria and insurance authorization.    Follow Up Recommendations  Home health OT     Assistance Recommended at Discharge Intermittent Supervision/Assistance  Patient can return home with the following  A little help with walking and/or transfers;A lot of help with bathing/dressing/bathroom;Assistance with cooking/housework;Assist for transportation;Help with stairs or ramp for entrance   Equipment Recommendations  Other (comment) (ADL adaptive equipment kit)    Recommendations for Other Services      Precautions / Restrictions Precautions Precautions: Fall Precaution Comments: NGT removed by nurse afterb pt up in recliner with OT Restrictions Weight Bearing Restrictions: No       Mobility Bed Mobility Overal bed mobility: Needs Assistance Bed Mobility: Rolling, Sidelying to Sit Rolling: Mod assist Sidelying to sit: Min assist       General bed mobility comments: mod A to bring LEs to/off EOB, min A to elevate trunk, pt able to scoot hips  forward to EOB without physical assist, just required increased time and effort    Transfers Overall transfer level: Needs assistance Equipment used: Rolling walker (2 wheels) Transfers: Sit to/from Stand Sit to Stand: Mod assist     Step pivot transfers: Min guard     General transfer comment: mod A to power up x 2 attempts sit- stand from EOB to RW with cues for hand placement. Pt required increased time and effort; fatigues easily     Balance Overall balance assessment: Needs assistance Sitting-balance support: No upper extremity supported, Feet supported Sitting balance-Leahy Scale: Good     Standing balance support: During functional activity Standing balance-Leahy Scale: Fair                             ADL either performed or assessed with clinical judgement   ADL Overall ADL's : Needs assistance/impaired     Grooming: Wash/dry hands;Wash/dry face;Min guard;Standing       Lower Body Bathing: Moderate assistance;Sitting/lateral leans Lower Body Bathing Details (indicate cue type and reason): simulated seated EOB     Lower Body Dressing: Maximal assistance Lower Body Dressing Details (indicate cue type and reason): socks and clean brief Toilet Transfer: Moderate assistance;Minimal assistance;Ambulation;Rolling walker (2 wheels);Cueing for safety;BSC/3in1   Toileting- Clothing Manipulation and Hygiene: Minimal assistance       Functional mobility during ADLs: Moderate assistance;Minimal assistance;Rolling walker (2 wheels);Cueing for safety General ADL Comments: initiated ADL A/E education with handout provided. Energy conservation training initiated with handouts provided    Extremity/Trunk Assessment Upper Extremity Assessment Upper Extremity Assessment: Generalized weakness   Lower Extremity Assessment Lower Extremity Assessment: Defer to PT evaluation   Cervical / Trunk  Assessment Cervical / Trunk Assessment: Other exceptions Cervical /  Trunk Exceptions: abdominal sx    Vision Ability to See in Adequate Light: 0 Adequate Patient Visual Report: No change from baseline     Perception     Praxis      Cognition Arousal/Alertness: Awake/alert Behavior During Therapy: WFL for tasks assessed/performed Overall Cognitive Status: Within Functional Limits for tasks assessed                                 General Comments: pleasant, cooperative, jovial        Exercises      Shoulder Instructions       General Comments      Pertinent Vitals/ Pain       Pain Assessment Pain Assessment: 0-10 Pain Score: 4  Pain Location: abdomen Pain Descriptors / Indicators: Sore, Operative site guarding, Discomfort Pain Intervention(s): Monitored during session, Premedicated before session, Repositioned  Home Living                                          Prior Functioning/Environment              Frequency  Min 2X/week        Progress Toward Goals  OT Goals(current goals can now be found in the care plan section)  Progress towards OT goals: Progressing toward goals     Plan Discharge plan remains appropriate;Frequency remains appropriate    Co-evaluation                 AM-PAC OT "6 Clicks" Daily Activity     Outcome Measure   Help from another person eating meals?: None (NGT removed by nursing today) Help from another person taking care of personal grooming?: A Little Help from another person toileting, which includes using toliet, bedpan, or urinal?: A Little Help from another person bathing (including washing, rinsing, drying)?: A Lot Help from another person to put on and taking off regular upper body clothing?: A Little Help from another person to put on and taking off regular lower body clothing?: A Lot 6 Click Score: 17    End of Session Equipment Utilized During Treatment: Gait belt;Rolling walker (2 wheels);Other (comment) (BSC)  OT Visit Diagnosis:  Unsteadiness on feet (R26.81);Pain;Muscle weakness (generalized) (M62.81) Pain - part of body:  (abdomen)   Activity Tolerance Patient tolerated treatment well;Patient limited by fatigue   Patient Left in chair;with call bell/phone within reach   Nurse Communication          Time: [id number] OT Time Calculation (min): 27 min  Charges: OT General Charges $OT Visit: 1 Visit OT Treatments $Self Care/Home Management : 8-22 mins $Therapeutic Activity: 8-22 mins    Britt Bottom 04/29/2022, 1:46 PM

## 2022-04-29 NOTE — Progress Notes (Signed)
[facility]                                                                              Heather [name], is a 77 y.o. female, DOB - 08-Apr-1945, [id number] Admit date - 04/24/2022    Outpatient Primary MD for the patient is Quay Burow, [name], MD  LOS - 4  days  Chief Complaint  Patient presents with   Abdominal Pain   generalized weakness   Emesis       Brief summary   Patient is a 77 year old female with HTN, OSA on CPAP, prediabetes, HLP, CAD, NASH presented with abdominal pain.  Per patient she had a full meal a week ago on Saturday, 2/17 night.  Next day on Sunday she developed generalized abdominal pain that progressively became worse.  Initially it slightly improved however has been continuous since then with nausea and vomiting for the first 3 days.  No BM for the last 2 to 3 days.  Her abdomen was distended. In ED, CT abdomen pelvis noted distal SBO with single point of transition within the umbilical hernia sac just proximal to the ileocecal valve.  Approximately small bowel is diffusely dilated and fluid-filled. Surgery was consulted and patient was admitted for further workup.    Assessment & Plan    Principal Problem:   SBO (small bowel obstruction) (HCC) with a ventral hernia with obstruction -Presented with abdominal pain, nausea and vomiting, poor p.o. intake, incarcerated umbilical hernia -underwent open primary incarcerated umbilical hernia repair and appendectomy on 2/24, postop day # 4 -2 BMs this morning, feels a lot better today.  Surgery following. -Plan to discontinue NGT and diet advanced to full liquids.  Continue to mobilize.  Active Problems: Dyspnea likely due to fluid overload and aspiration -Noted to be in fluid overload on 2/27, received Lasix 20 mg IV x 1.  -Placed on IV Rocephin and Flagyl due to concern for aspiration on 2/27.  No fevers, lungs clear, no hypoxia or tachypnea -May not need any antibiotics at discharge. -Will  resume oral Lasix  Hiatal hernia -Transition to oral Protonix    CAD S/P percutaneous coronary angioplasty -Resume Lasix, Plavix, Crestor    Hyperlipidemia -Resume Crestor  Essential hypertension -BP stable    Hyponatremia -Resolved  Hypokalemia, hypophosphatemia -Replaced IV  Mild acute kidney injury -Likely due to #1, poor p.o. intake, nausea and vomiting in the last 1 week -Creatinine 1.09 on admission, baseline 0.8, trended up to 1.1 Resolved, creatinine 0.5  ObesityBMI 36.0-36.9,adult Estimated body mass index is 35.55 kg/m as calculated from the following:   Height as of this encounter: 5' 4.02" (1.626 m).   Weight as of this encounter: 94 kg.  Code Status: Full code DVT Prophylaxis:  enoxaparin (LOVENOX) injection 40 mg Start: 04/25/22 1000   Level of Care: Level of care: Telemetry Medical Family Communication: Updated patient Disposition Plan:      Remains inpatient appropriate:    Procedures:   Open primary incarcerated umbilical hernia repair and appendectomy on 04/25/2022  Consultants: Surgery   Antimicrobials: None  Medications  acetaminophen  1,000 mg Oral Q6H   enoxaparin (LOVENOX) injection  40 mg Subcutaneous Daily   pantoprazole (PROTONIX) IV  40 mg Intravenous Daily      Subjective:   Heather [name] was seen and examined today.  Had 2 BMs this morning, no nausea vomiting, no fevers.  Feeling a lot better today.   Objective:   Vitals:   04/28/22 0847 04/28/22 1731 04/28/22 2012 04/29/22 0324  BP: 132/72 127/76 130/88 135/79  Pulse: 84 (!) 101 (!) 110 (!) 102  Resp: 18 18 18 18  $ Temp: 99.1 F (37.3 C) 98.3 F (36.8 C) 98.1 F (36.7 C) 98.9 F (37.2 C)  TempSrc: Oral Oral Oral Oral  SpO2: 94% 96% 100% 96%  Weight:      Height:        Intake/Output Summary (Last 24 hours) at 04/29/2022 1135 Last data filed at 04/28/2022 1817 Gross per 24 hour  Intake 650 ml  Output --  Net 650 ml     Wt Readings from Last 3 Encounters:   04/25/22 94 kg  04/23/22 93.9 kg  04/14/22 97.1 kg   Physical Exam General: Alert and oriented x 3, NAD, NGT clamped Cardiovascular: S1 S2 clear, RRR.  Respiratory: Diminished BS at bases Gastrointestinal: Soft, + BS, midline incision CDI Ext: no pedal edema bilaterally Neuro: no new deficits Skin: No rashes Psych: Normal affect    Data Reviewed:  I have personally reviewed following labs    CBC Lab Results  Component Value Date   WBC 7.7 04/29/2022   RBC 4.23 04/29/2022   HGB 13.0 04/29/2022   HCT 41.1 04/29/2022   MCV 97.2 04/29/2022   MCH 30.7 04/29/2022   PLT 221 04/29/2022   MCHC 31.6 04/29/2022   RDW 15.3 04/29/2022   LYMPHSABS 0.6 (L) 04/25/2022   MONOABS 0.8 04/25/2022   EOSABS 0.0 04/25/2022   BASOSABS 0.0 [id number]     Last metabolic panel Lab Results  Component Value Date   NA 138 04/29/2022   K 3.2 (L) 04/29/2022   CL 102 04/29/2022   CO2 25 04/29/2022   BUN 8 04/29/2022   CREATININE 0.54 04/29/2022   GLUCOSE 112 (H) 04/29/2022   GFRNONAA >60 04/29/2022   GFRAA 100 10/09/2019   CALCIUM 7.8 (L) 04/29/2022   PHOS 1.5 (L) 04/29/2022   PROT 8.2 (H) 04/24/2022   ALBUMIN 3.6 04/24/2022   LABGLOB 3.4 04/14/2021   AGRATIO 1.1 (L) 04/14/2021   BILITOT 0.8 04/24/2022   ALKPHOS 54 04/24/2022   AST 34 04/24/2022   ALT 40 04/24/2022   ANIONGAP 11 04/29/2022    CBG (last 3)  No results for input(s): "GLUCAP" in the last 72 hours.     Coagulation Profile: No results for input(s): "INR", "PROTIME" in the last 168 hours.   Radiology Studies: I have personally reviewed the imaging studies  DG CHEST PORT 1 VIEW  Result Date: 04/28/2022 CLINICAL DATA:  Dyspnea EXAM: PORTABLE CHEST 1 VIEW COMPARISON:  02/15/2015 FINDINGS: Large right-sided hiatal hernia identified with NG tube entering the hernia. Cardiac silhouette is enlarged. There is bilateral basilar consolidation more severe on the right. No pneumothorax identified. There may be right-sided  pleural effusion. IMPRESSION: Bibasilar consolidation. Enlarged cardiac silhouette. Possible right-sided pleural effusion. Unusual course of the NG tube which appears to enter large hiatal hernia on the right. Electronically Signed   By: Sammie Bench M.D.   On: 04/28/2022 08:50       Jessicalynn Deshong M.D. [facility] 04/29/2022, 11:35 AM  Available via Epic secure chat 7am-7pm After 7 pm,  please refer to night coverage provider listed on amion.

## 2022-04-29 NOTE — Progress Notes (Addendum)
MD paged about HR sustaining in 120's. HR as high as 130, non sustaining. PT denies pain and/or SOB, lying in bed. Pt drowsy and able to be easily aroused. No new orders given.

## 2022-04-30 ENCOUNTER — Other Ambulatory Visit (HOSPITAL_COMMUNITY): Payer: Self-pay

## 2022-04-30 DIAGNOSIS — K436 Other and unspecified ventral hernia with obstruction, without gangrene: Secondary | ICD-10-CM | POA: Diagnosis not present

## 2022-04-30 DIAGNOSIS — Z6836 Body mass index (BMI) 36.0-36.9, adult: Secondary | ICD-10-CM | POA: Diagnosis not present

## 2022-04-30 DIAGNOSIS — I2510 Atherosclerotic heart disease of native coronary artery without angina pectoris: Secondary | ICD-10-CM | POA: Diagnosis not present

## 2022-04-30 DIAGNOSIS — K56609 Unspecified intestinal obstruction, unspecified as to partial versus complete obstruction: Secondary | ICD-10-CM | POA: Diagnosis not present

## 2022-04-30 LAB — BASIC METABOLIC PANEL
Anion gap: 8 (ref 5–15)
BUN: 9 mg/dL (ref 8–23)
CO2: 27 mmol/L (ref 22–32)
Calcium: 7.3 mg/dL — ABNORMAL LOW (ref 8.9–10.3)
Chloride: 104 mmol/L (ref 98–111)
Creatinine, Ser: 0.56 mg/dL (ref 0.44–1.00)
GFR, Estimated: >60 mL/min (ref >60)
Glucose, Bld: 100 mg/dL — ABNORMAL HIGH (ref 70–99)
Potassium: 3.4 mmol/L — ABNORMAL LOW (ref 3.5–5.1)
Sodium: 139 mmol/L (ref 135–145)

## 2022-04-30 LAB — CBC
HCT: 36.5 % (ref 36.0–46.0)
Hemoglobin: 11.9 g/dL — ABNORMAL LOW (ref 12.0–15.0)
MCH: 31.2 pg (ref 26.0–34.0)
MCHC: 32.6 g/dL (ref 30.0–36.0)
MCV: 95.5 fL (ref 80.0–100.0)
Platelets: 234 10*3/uL (ref 150–400)
RBC: 3.82 MIL/uL — ABNORMAL LOW (ref 3.87–5.11)
RDW: 15.5 % (ref 11.5–15.5)
WBC: 7.1 10*3/uL (ref 4.0–10.5)
nRBC: 0 % (ref 0.0–0.2)

## 2022-04-30 LAB — PHOSPHORUS: Phosphorus: 2.7 mg/dL (ref 2.5–4.6)

## 2022-04-30 LAB — MAGNESIUM: Magnesium: 1.9 mg/dL (ref 1.7–2.4)

## 2022-04-30 MED ORDER — METHOCARBAMOL 500 MG PO TABS
500.0000 mg | ORAL_TABLET | Freq: Three times a day (TID) | ORAL | 0 refills | Status: DC | PRN
  Filled 2022-04-30: qty 30, 10d supply, fill #0

## 2022-04-30 MED ORDER — OXYCODONE HCL 5 MG PO TABS
5.0000 mg | ORAL_TABLET | Freq: Four times a day (QID) | ORAL | 0 refills | Status: DC | PRN
  Filled 2022-04-30: qty 20, 5d supply, fill #0

## 2022-04-30 MED ORDER — POTASSIUM CHLORIDE CRYS ER 20 MEQ PO TBCR
40.0000 meq | EXTENDED_RELEASE_TABLET | Freq: Once | ORAL | Status: AC
  Administered 2022-04-30: 40 meq via ORAL
  Filled 2022-04-30: qty 2

## 2022-04-30 MED ORDER — ONDANSETRON HCL 4 MG PO TABS: 4.0000 mg | ORAL_TABLET | Freq: Every day | ORAL | 1 refills | Status: DC | PRN

## 2022-04-30 NOTE — Discharge Instructions (Signed)
[facility]      [facility] [phone]  OPEN ABDOMINAL SURGERY: POST OP INSTRUCTIONS  Always review your discharge instruction sheet given to you by the facility where your surgery was performed.  IF YOU HAVE DISABILITY OR FAMILY LEAVE FORMS, YOU MUST BRING THEM TO THE OFFICE FOR PROCESSING.  PLEASE DO NOT GIVE THEM TO YOUR DOCTOR.  A prescription for pain medication may be given to you upon discharge.  Take your pain medication as prescribed, if needed.  If narcotic pain medicine is not needed, then you may take acetaminophen (Tylenol) or ibuprofen (Advil) as needed. Take your usually prescribed medications unless otherwise directed. If you need a refill on your pain medication, please contact your pharmacy. They will contact our office to request authorization.  Prescriptions will not be filled after 5pm or on week-ends. You should follow a light diet the first few days after arrival home, such as soup and crackers, pudding, etc.unless your doctor has advised otherwise. A high-fiber, low fat diet can be resumed as tolerated.   Be sure to include lots of fluids daily. Most patients will experience some swelling and bruising on the chest and neck area.  Ice packs will help.  Swelling and bruising can take several days to resolve Most patients will experience some swelling and bruising in the area of the incision. Ice pack will help. Swelling and bruising can take several days to resolve..  It is common to experience some constipation if taking pain medication after surgery.  Increasing fluid intake and taking a stool softener will usually help or prevent this problem from occurring.  A mild laxative (Milk of Magnesia or Miralax) should be taken according to package directions if there are no bowel movements after 48 hours.  You may have steri-strips (small skin tapes) in place directly over the incision.  These strips should be left on the skin for 7-10 days.  If your surgeon used skin  glue on the incision, you may shower in 24 hours.  The glue will flake off over the next 2-3 weeks.  Any sutures or staples will be removed at the office during your follow-up visit. You may find that a light gauze bandage over your incision may keep your staples from being rubbed or pulled. You may shower and replace the bandage daily. ACTIVITIES:  You may resume regular (light) daily activities beginning the next day--such as daily self-care, walking, climbing stairs--gradually increasing activities as tolerated.  You may have sexual intercourse when it is comfortable.  Refrain from any heavy lifting or straining until approved by your doctor. You may drive when you no longer are taking prescription pain medication, you can comfortably wear a seatbelt, and you can safely maneuver your car and apply brakes Return to Work: ___________________________________ You should see your doctor in the office for a follow-up appointment approximately two weeks after your surgery.  Make sure that you call for this appointment within a day or two after you arrive home to insure a convenient appointment time. OTHER INSTRUCTIONS:  _____________________________________________________________ _____________________________________________________________  WHEN TO CALL YOUR DOCTOR: Fever over 101.0 Inability to urinate Nausea and/or vomiting Extreme swelling or bruising Continued bleeding from incision. Increased pain, redness, or drainage from the incision. Difficulty swallowing or breathing Muscle cramping or spasms. Numbness or tingling in hands or feet or around lips.  The clinic staff is available to answer your questions during regular business hours.  Please don't hesitate to call and ask to speak to one of   the nurses if you have concerns.  For further questions, please visit [email or web address]  

## 2022-04-30 NOTE — Progress Notes (Signed)
5 Days Post-Op  Subjective: Feels well.  Had 3 BMs yesterday.  Tolerating FLD with no issues.  mobilizing   Objective: Vital signs in last 24 hours: Temp:  [98.3 F (36.8 C)-98.4 F (36.9 C)] 98.3 F (36.8 C) (02/29 0831) Pulse Rate:  [86-100] 95 (02/29 0831) Resp:  [17-18] 18 (02/29 0831) BP: (103-121)/(54-71) 121/71 (02/29 0831) SpO2:  [94 %-99 %] 94 % (02/29 0831) FiO2 (%):  [21 %] 21 % (02/28 2357) Last BM Date : 04/29/22  Intake/Output from previous day: 02/28 0701 - 02/29 0700 In: 600 [P.O.:600] Out: -  Intake/Output this shift: No intake/output data recorded.  PE: Gen: NAD Abd: soft, + BS, midline incision is c/d/I with dermabond.  Appropriately tender  Lab Results:  Recent Labs    04/29/22 0634 04/30/22 0222  WBC 7.7 7.1  HGB 13.0 11.9*  HCT 41.1 36.5  PLT 221 234   BMET Recent Labs    04/29/22 0634 04/30/22 0222  NA 138 139  K 3.2* 3.4*  CL 102 104  CO2 25 27  GLUCOSE 112* 100*  BUN 8 9  CREATININE 0.54 0.56  CALCIUM 7.8* 7.3*   PT/INR No results for input(s): "LABPROT", "INR" in the last 72 hours. CMP     Component Value Date/Time   NA 139 04/30/2022 0222   NA 143 04/14/2021 1140   K 3.4 (L) 04/30/2022 0222   CL 104 04/30/2022 0222   CO2 27 04/30/2022 0222   GLUCOSE 100 (H) 04/30/2022 0222   BUN 9 04/30/2022 0222   BUN 14 04/14/2021 1140   CREATININE 0.56 04/30/2022 0222   CREATININE 0.80 12/07/2019 1246   CREATININE 0.81 12/28/2011 0853   CALCIUM 7.3 (L) 04/30/2022 0222   PROT 8.2 (H) 04/24/2022 1924   PROT 7.3 04/14/2021 1140   ALBUMIN 3.6 04/24/2022 1924   ALBUMIN 3.9 04/14/2021 1140   AST 34 04/24/2022 1924   AST 19 12/07/2019 1246   ALT 40 04/24/2022 1924   ALT 20 12/07/2019 1246   ALKPHOS 54 04/24/2022 1924   BILITOT 0.8 04/24/2022 1924   BILITOT 0.2 04/14/2021 1140   BILITOT 0.3 12/07/2019 1246   GFRNONAA >60 04/30/2022 0222   GFRNONAA >60 12/07/2019 1246   GFRAA 100 10/09/2019 1425   Lipase     Component  Value Date/Time   LIPASE 32 04/24/2022 1924       Studies/Results: DG CHEST PORT 1 VIEW  Result Date: 04/28/2022 CLINICAL DATA:  Dyspnea EXAM: PORTABLE CHEST 1 VIEW COMPARISON:  02/15/2015 FINDINGS: Large right-sided hiatal hernia identified with NG tube entering the hernia. Cardiac silhouette is enlarged. There is bilateral basilar consolidation more severe on the right. No pneumothorax identified. There may be right-sided pleural effusion. IMPRESSION: Bibasilar consolidation. Enlarged cardiac silhouette. Possible right-sided pleural effusion. Unusual course of the NG tube which appears to enter large hiatal hernia on the right. Electronically Signed   By: Sammie Bench M.D.   On: 04/28/2022 08:50    Anti-infectives: Anti-infectives (From admission, onward)    Start     Dose/Rate Route Frequency Ordered Stop   04/28/22 1500  cefTRIAXone (ROCEPHIN) 2 g in sodium chloride 0.9 % 100 mL IVPB        2 g 200 mL/hr over 30 Minutes Intravenous Every 24 hours 04/28/22 1402     04/28/22 1500  metroNIDAZOLE (FLAGYL) IVPB 500 mg        500 mg 100 mL/hr over 60 Minutes Intravenous Every 12 hours 04/28/22 1402  Assessment/Plan POD 5, s/p open primary repair of umbilical hernia with appendectomy, Dr. Thermon Leyland for incarcerated umbilical hernia [id number] -soft diet, if tolerates may DC home later today or tomorrow from surgical standpoint.  Patient is hopeful to DC home today.  Message sent to primary team relaying that info. - mobilize -multi-modal pain control -follow up being arranged, pain medication sent to pharmacy, etc  FEN - soft VTE - Lovenox ID - none  Tachypnea -  per medicine Tachycardia -  per medicine CAD CHF HLD   LOS: 5 days    Henreitta Cea , [facility] 04/30/2022, 8:37 AM Please see Amion for pager number during day hours 7:00am-4:30pm or 7:00am -11:30am on weekends

## 2022-04-30 NOTE — Discharge Summary (Signed)
Physician Discharge Summary   Patient: Heather [name] MRN: [id number] DOB: [birth year]  Admit date:     04/24/2022  Discharge date: 04/30/22  Discharge Physician: Estill Cotta, MD    PCP: [name], MD   Recommendations at discharge:   Avoid constipation, outpatient follow-up with general surgery scheduled  Discharge Diagnoses:    SBO (small bowel obstruction) ([place]) Incarcerated umbilical hernia status post repair with appendicectomy   CAD S/P percutaneous coronary angioplasty Obesity   Hyperlipidemia   Hyponatremia   Hospital Course: Patient is a 77 year old female with HTN, OSA on CPAP, prediabetes, HLP, CAD, NASH presented with abdominal pain.  Per patient she had a full meal a week ago on Saturday, 2/17 night.  Next day on Sunday she developed generalized abdominal pain that progressively became worse.  Initially it slightly improved however has been continuous since then with nausea and vomiting for the first 3 days.  No BM for the last 2 to 3 days.  Her abdomen was distended. In ED, CT abdomen pelvis noted distal SBO with single point of transition within the umbilical hernia sac just proximal to the ileocecal valve.  Approximately small bowel is diffusely dilated and fluid-filled. Surgery was consulted and patient was admitted for further workup.   Assessment and Plan:  SBO (small bowel obstruction) ([place]) with a ventral hernia with obstruction -Presented with abdominal pain, nausea and vomiting, poor p.o. intake, incarcerated umbilical hernia -underwent open primary incarcerated umbilical hernia repair and appendectomy on 2/24, postop day # 34 -Doing well, ambulating, tolerating diet, had 3 BMs yesterday. -Cleared by general surgery for discharge home   Dyspnea likely due to fluid overload -Noted to be in fluid overload on 2/27, received Lasix 20 mg IV x 1.  -Takes Lasix 40 mg daily at home, resume -Lungs clear, no leukocytosis, fevers.  No need of  antibiotics.  Hiatal hernia -Continue Protonix     CAD S/P percutaneous coronary angioplasty -Resume Lasix, Plavix, Crestor     Hyperlipidemia -Resume Crestor   Essential hypertension -BP stable     Hyponatremia -Resolved   Hypokalemia, hypophosphatemia -Potassium 3.4, replaced Phosphorus 0.7   Mild acute kidney injury -Likely due to #1, poor p.o. intake, nausea and vomiting in the last 1 week -Creatinine 1.09 on admission, baseline 0.8, trended up to 1.1 Resolved, creatinine 0.56    Obesity   BMI 36.0-36.9,adult Estimated body mass index is 35.55 kg/m as calculated from the following:   Height as of this encounter: 5' 4.02" (1.626 m).   Weight as of this encounter: 94 kg.        Pain control - [place] Controlled Substance Reporting System database was reviewed. and patient was instructed, not to drive, operate heavy machinery, perform activities at heights, swimming or participation in water activities or provide baby-sitting services while on Pain, Sleep and Anxiety Medications; until their outpatient Physician has advised to do so again. Also recommended to not to take more than prescribed Pain, Sleep and Anxiety Medications.  Consultants: General surgery Procedures performed:  Open primary incarcerated umbilical hernia repair and appendectomy  Disposition: Home Diet recommendation:  Cardiac diet DISCHARGE MEDICATION: Allergies as of 04/30/2022       Reactions   Aspirin Hives   Coconut (cocos Nucifera) Hives   Lisinopril Cough   Metoprolol Itching   Other    Latex paint, the smell causes hives. Not allergic to latex gloves  General anesthesia - has had issues in past where anesthesia was not properly administered  and caused side effects   Peanut-containing Drug Products Hives, Itching   Prednisone    Unknown reaction    Strawberry Extract Hives   Penicillins Rash        Medication List     STOP taking these medications    naproxen sodium  220 MG tablet Commonly known as: ALEVE   traMADol 50 MG tablet Commonly known as: ULTRAM       TAKE these medications    acetaminophen 500 MG tablet Commonly known as: TYLENOL Take 1,000 mg by mouth 3 (three) times daily as needed for moderate pain.   cetirizine 10 MG tablet Commonly known as: ZYRTEC Take 10 mg by mouth daily.   CHEWABLE CALCIUM/D3 PO Take 1 tablet by mouth daily.   clopidogrel 75 MG tablet Commonly known as: PLAVIX TAKE 1 TABLET DAILY   CO Q 10 PO Take 400 mg by mouth.   Cyanocobalamin 1000 MCG Caps Take 1 capsule by mouth daily.   ezetimibe 10 MG tablet Commonly known as: ZETIA Take 1 tablet (10 mg total) by mouth daily.   FISH OIL PO Take 2 g by mouth daily.   furosemide 40 MG tablet Commonly known as: LASIX Take 1 tablet (40 mg total) by mouth daily as needed. What changed: reasons to take this   gabapentin 100 MG capsule Commonly known as: NEURONTIN TAKE 3 CAPSULES BY MOUTH AT BEDTIME What changed:  how much to take how to take this when to take this   Iron 325 (65 Fe) MG Tabs Take 1 tablet by mouth daily.   Klor-Con M20 20 MEQ tablet Generic drug: potassium chloride SA TAKE 1 TABLET DAILY What changed: how much to take   methocarbamol 500 MG tablet Commonly known as: ROBAXIN Take 1 tablet (500 mg total) by mouth every 8 (eight) hours as needed for muscle spasms.   multivitamin tablet Take 1 tablet by mouth daily.   ondansetron 4 MG tablet Commonly known as: Zofran Take 1 tablet (4 mg total) by mouth daily as needed for nausea or vomiting.   OSTEO BI-FLEX ONE PER DAY PO Take 1 tablet by mouth daily.   oxyCODONE 5 MG immediate release tablet Commonly known as: Oxy IR/ROXICODONE Take 1-2 tablets (5-10 mg total) by mouth every 6 (six) hours as needed for moderate pain.   pantoprazole 20 MG tablet Commonly known as: PROTONIX Take 1 tablet (20 mg total) by mouth 2 (two) times daily. 30 min before food   polyethylene  glycol 17 g packet Commonly known as: MIRALAX / GLYCOLAX Take 17 g by mouth every other day.   pyridoxine 200 MG tablet Commonly known as: B-6 Take 200 mg by mouth daily.   rosuvastatin 40 MG tablet Commonly known as: CRESTOR Take 1 tablet (40 mg total) by mouth daily. At night   topiramate 25 MG tablet Commonly known as: TOPAMAX TAKE 1 TABLET 3 TIMES A DAY   trospium 20 MG tablet Commonly known as: SANCTURA Take 1 tablet (20 mg total) by mouth 2 (two) times daily. Prn overactive bladder What changed:  when to take this additional instructions   TURMERIC PO Take 538 mg by mouth daily.   Vitamin D (Ergocalciferol) 1.25 MG (50000 UNIT) Caps capsule Commonly known as: DRISDOL Take 1 capsule (50,000 Units total) by mouth every 7 (seven) days. What changed: additional instructions   vitamin E 200 UNIT capsule Take 200 Units by mouth daily.        Follow-up Information  [facility] [place] Follow up.   Specialty: [facility] Contact information: [place] [address] [phone]         Stechschulte, Nickola Major, MD Follow up in 3 week(s).   Specialty: Surgery Why: Arrive 30 minutes prior to your appointment time, Please bring your insurance card and photo ID Contact information: [id number] [place] [address] [phone]         [name], MD. Schedule an appointment as soon as possible for a visit in 2 week(s).   Specialty: Internal Medicine Why: for hospital follow-up Contact information: [place] [address] [phone]                Discharge Exam: Danley Danker Weights   04/24/22 1911 04/25/22 1149 04/25/22 1153  Weight: 93.9 kg 94 kg 94 kg   S: Tolerating diet, hoping to go home today.  Had 3 BMs yesterday, having flatus.  No fevers or chills, no worsening abdominal pain.  BP 121/71 (BP Location: Left Arm)   Pulse 95   Temp 98.3 F (36.8 C) (Oral)   Resp  18   Ht 5' 4.02" (1.626 m)   Wt 94 kg   SpO2 94%   BMI 35.55 kg/m    Physical Exam General: Alert and oriented x 3, NAD Cardiovascular: S1 S2 clear, RRR.  Respiratory: CTAB, no wheezing, rales or rhonchi Gastrointestinal: Soft, ND, NBS, midline incision CDI Ext: no pedal edema bilaterally Neuro: no new deficits Psych: Normal affect    Condition at discharge: fair  The results of significant diagnostics from this hospitalization (including imaging, microbiology, ancillary and laboratory) are listed below for reference.   Imaging Studies: DG CHEST PORT 1 VIEW  Result Date: 04/28/2022 CLINICAL DATA:  Dyspnea EXAM: PORTABLE CHEST 1 VIEW COMPARISON:  02/15/2015 FINDINGS: Large right-sided hiatal hernia identified with NG tube entering the hernia. Cardiac silhouette is enlarged. There is bilateral basilar consolidation more severe on the right. No pneumothorax identified. There may be right-sided pleural effusion. IMPRESSION: Bibasilar consolidation. Enlarged cardiac silhouette. Possible right-sided pleural effusion. Unusual course of the NG tube which appears to enter large hiatal hernia on the right. Electronically Signed   By: Sammie Bench M.D.   On: 04/28/2022 08:50   DG Abdomen 1 View  Result Date: 04/25/2022 CLINICAL DATA:  77 year old female status post nasogastric tube placement. EXAM: ABDOMEN - 1 VIEW COMPARISON:  CT the abdomen and pelvis 04/24/2022. FINDINGS: Unusual positioning of the nasogastric tube whose tip projects over the lower right hemithorax, presumably within the patient's large hiatal hernia, likely in the periphery of the stomach based on comparison with prior CT examination. IMPRESSION: 1. Tip of nasogastric tube projects over the lower right hemithorax, presumably within the stomach based on comparison with prior CT examination. Electronically Signed   By: Vinnie Langton M.D.   On: 04/25/2022 05:17   CT ABDOMEN PELVIS W CONTRAST  Result Date:  04/25/2022 CLINICAL DATA:  Abdominal pain, acute, nonlocalized.  Vomiting. EXAM: CT ABDOMEN AND PELVIS WITH CONTRAST TECHNIQUE: Multidetector CT imaging of the abdomen and pelvis was performed using the standard protocol following bolus administration of intravenous contrast. RADIATION DOSE REDUCTION: This exam was performed according to the departmental dose-optimization program which includes automated exposure control, adjustment of the mA and/or kV according to patient size and/or use of iterative reconstruction technique. CONTRAST:  72m OMNIPAQUE IOHEXOL 350 MG/ML SOLN COMPARISON:  CT chest 01/14/2018 FINDINGS: Lower chest: Large  hiatal hernia is again identified containing the entire stomach, the majority of the transverse colon, and several loops of dilated, fluid-filled small bowel. Right basilar atelectasis. No pleural effusion. Mild mass effect upon the base of the heart by the large hiatal hernia, increased since prior examination. Hepatobiliary: No focal liver abnormality is seen. No gallstones, gallbladder wall thickening, or biliary dilatation. Pancreas: Unremarkable Spleen: Unremarkable Adrenals/Urinary Tract: Adrenal glands are unremarkable. Kidneys are normal, without renal calculi, focal lesion, or hydronephrosis. Bladder is unremarkable. Stomach/Bowel: A distal small bowel obstruction is present with a single point of transition identified within an bili cul hernia sac just proximal to the ileocecal valve seen on axial image # 60/3. Proximally, the small bowel is diffusely dilated and fluid-filled. The distal tip of the cecum has also herniated into the umbilical hernia and the appendix is fluid-filled, dilated, and hyperemic, best seen on axial image # 68/3. This may relate to obstruction involving the distal tip of the cecum and appendix, however, superimposed appendicitis within the umbilical hernia sac would appear similarly. As noted previously, several loops of small bowel and large bowel  herniate into the large hiatal hernia into the thoracic compartment and there is marked narrowing of several loops of large and small bowel as they extend into the hernia sac, however, these do not appear to result in focal obstruction by themselves. No free intraperitoneal gas or fluid. Superimposed moderate to severe descending and sigmoid colonic diverticulosis without superimposed acute inflammatory change in this location. Vascular/Lymphatic: Aortic atherosclerosis. No enlarged abdominal or pelvic lymph nodes. Reproductive: Heterogeneous enhancement of the uterus relates to multiple underlying uterine fibroids. No adnexal masses are seen. Other: None Musculoskeletal: Degenerative changes are seen within the lumbar spine. No acute bone abnormality. No lytic or blastic bone lesion. IMPRESSION: 1. Distal small bowel obstruction with a single point of transition within an umbilical hernia sac just proximal to the ileocecal valve. Proximally, the small bowel is diffusely dilated and fluid-filled. 2. The distal tip of the cecum and appendix has also herniated into the umbilical hernia and the appendix is fluid-filled, dilated, and hyperemic. This may relate to obstruction involving the distal tip of the cecum and appendix, however, superimposed appendicitis within the umbilical hernia sac would appear similarly. 3. Large hiatal hernia containing the entire stomach, the majority of the transverse colon, and several loops of fluid-filled dilated small bowel. Narrowing of several loops of bowel at the hiatal orifice is noted without evidence of obstruction. 4. Moderate to severe distal colonic diverticulosis without superimposed acute inflammatory change. 5. Fibroid uterus. 6. Aortic atherosclerosis. Aortic Atherosclerosis (ICD10-I70.0). Electronically Signed   By: Fidela Salisbury M.D.   On: 04/25/2022 00:51    Microbiology: Results for orders placed or performed during the hospital encounter of 03/07/21  SARS  CORONAVIRUS 2 (TAT 6-24 HRS) Nasopharyngeal Nasopharyngeal Swab     Status: None   Collection Time: 03/07/21  1:31 PM   Specimen: Nasopharyngeal Swab  Result Value Ref Range Status   SARS Coronavirus 2 NEGATIVE NEGATIVE Final    Comment: (NOTE) SARS-CoV-2 target nucleic acids are NOT DETECTED.  The SARS-CoV-2 RNA is generally detectable in upper and lower respiratory specimens during the acute phase of infection. Negative results do not preclude SARS-CoV-2 infection, do not rule out co-infections with other pathogens, and should not be used as the sole basis for treatment or other patient management decisions. Negative results must be combined with clinical observations, patient history, and epidemiological information. The expected result is Negative.  Fact Sheet for Patients: SugarRoll.be  Fact Sheet for Healthcare Providers: https://www.woods-mathews.com/  This test is not yet approved or cleared by the [place] FDA and  has been authorized for detection and/or diagnosis of SARS-CoV-2 by FDA under an Emergency Use Authorization (EUA). This EUA will remain  in effect (meaning this test can be used) for the duration of the COVID-19 declaration under Se ction 564(b)(1) of the Act, 21 U.S.C. section 360bbb-3(b)(1), unless the authorization is terminated or revoked sooner.  Performed at [facility], [place] [address], [place], [place] [address]   Surgical pcr screen     Status: None   Collection Time: 03/07/21  1:32 PM   Specimen: Nasal Mucosa; Nasal Swab  Result Value Ref Range Status   MRSA, PCR NEGATIVE NEGATIVE Final   Staphylococcus aureus NEGATIVE NEGATIVE Final    Comment: (NOTE) The Xpert SA Assay (FDA approved for NASAL specimens in patients 10 years of age and older), is one component of a comprehensive surveillance program. It is not intended to diagnose infection nor to guide or monitor treatment. Performed at  [facility], [place] [address], [place], [place] [address]    *Note: Due to a large number of results and/or encounters for the requested time period, some results have not been displayed. A complete set of results can be found in Results Review.    Labs: CBC: Recent Labs  Lab 04/25/22 0448 04/26/22 1108 04/27/22 0209 04/28/22 0407 04/29/22 0634 04/30/22 0222  WBC 4.5 6.4 7.2 7.7 7.7 7.1  NEUTROABS 3.0  --   --   --   --   --   HGB 16.6* 12.4 11.6* 12.0 13.0 11.9*  HCT 49.4* 37.8 37.7 38.6 41.1 36.5  MCV 95.4 97.9 101.3* 99.5 97.2 95.5  PLT 216 192 188 213 221 [id number]   Basic Metabolic Panel: Recent Labs  Lab 04/25/22 0448 04/26/22 1108 04/27/22 0209 04/28/22 0407 04/28/22 2358 04/29/22 0634 04/30/22 0222  NA 130*   < > 135 137 138 138 139  K 3.6   < > 4.0 3.8 3.4* 3.2* 3.4*  CL 87*   < > 104 102 101 102 104  CO2 25   < > '25 24 27 25 27  '$ GLUCOSE 87   < > 114* 118* 111* 112* 100*  BUN 32*   < > '12 8 8 8 9  '$ CREATININE 1.18*   < > 0.63 0.58 0.58 0.54 0.56  CALCIUM 9.9   < > 7.8* 8.0* 8.0* 7.8* 7.3*  MG 2.3  --   --   --  2.1 2.1 1.9  PHOS  --   --   --   --  1.8* 1.5* 2.7   < > = values in this interval not displayed.   Liver Function Tests: Recent Labs  Lab 04/24/22 1924  AST 34  ALT 40  ALKPHOS 54  BILITOT 0.8  PROT 8.2*  ALBUMIN 3.6   CBG: Recent Labs  Lab 04/24/22 1914  GLUCAP 119*    Discharge time spent: greater than 30 minutes.  Signed: Estill Cotta, MD [facility] 04/30/2022

## 2022-05-01 ENCOUNTER — Telehealth: Payer: Self-pay | Admitting: *Deleted

## 2022-05-01 DIAGNOSIS — J45909 Unspecified asthma, uncomplicated: Secondary | ICD-10-CM | POA: Diagnosis not present

## 2022-05-01 DIAGNOSIS — Z48815 Encounter for surgical aftercare following surgery on the digestive system: Secondary | ICD-10-CM | POA: Diagnosis not present

## 2022-05-01 DIAGNOSIS — E1142 Type 2 diabetes mellitus with diabetic polyneuropathy: Secondary | ICD-10-CM | POA: Diagnosis not present

## 2022-05-01 DIAGNOSIS — F32A Depression, unspecified: Secondary | ICD-10-CM | POA: Diagnosis not present

## 2022-05-01 DIAGNOSIS — I110 Hypertensive heart disease with heart failure: Secondary | ICD-10-CM | POA: Diagnosis not present

## 2022-05-01 DIAGNOSIS — F411 Generalized anxiety disorder: Secondary | ICD-10-CM | POA: Diagnosis not present

## 2022-05-01 DIAGNOSIS — K219 Gastro-esophageal reflux disease without esophagitis: Secondary | ICD-10-CM | POA: Diagnosis not present

## 2022-05-01 DIAGNOSIS — G43009 Migraine without aura, not intractable, without status migrainosus: Secondary | ICD-10-CM | POA: Diagnosis not present

## 2022-05-01 DIAGNOSIS — M791 Myalgia, unspecified site: Secondary | ICD-10-CM | POA: Diagnosis not present

## 2022-05-01 DIAGNOSIS — G8929 Other chronic pain: Secondary | ICD-10-CM | POA: Diagnosis not present

## 2022-05-01 DIAGNOSIS — K7581 Nonalcoholic steatohepatitis (NASH): Secondary | ICD-10-CM | POA: Diagnosis not present

## 2022-05-01 DIAGNOSIS — M5116 Intervertebral disc disorders with radiculopathy, lumbar region: Secondary | ICD-10-CM | POA: Diagnosis not present

## 2022-05-01 DIAGNOSIS — D509 Iron deficiency anemia, unspecified: Secondary | ICD-10-CM | POA: Diagnosis not present

## 2022-05-01 DIAGNOSIS — M47816 Spondylosis without myelopathy or radiculopathy, lumbar region: Secondary | ICD-10-CM | POA: Diagnosis not present

## 2022-05-01 DIAGNOSIS — H9319 Tinnitus, unspecified ear: Secondary | ICD-10-CM | POA: Diagnosis not present

## 2022-05-01 DIAGNOSIS — D510 Vitamin B12 deficiency anemia due to intrinsic factor deficiency: Secondary | ICD-10-CM | POA: Diagnosis not present

## 2022-05-01 DIAGNOSIS — I2510 Atherosclerotic heart disease of native coronary artery without angina pectoris: Secondary | ICD-10-CM | POA: Diagnosis not present

## 2022-05-01 DIAGNOSIS — M1990 Unspecified osteoarthritis, unspecified site: Secondary | ICD-10-CM | POA: Diagnosis not present

## 2022-05-01 DIAGNOSIS — E785 Hyperlipidemia, unspecified: Secondary | ICD-10-CM | POA: Diagnosis not present

## 2022-05-01 DIAGNOSIS — I509 Heart failure, unspecified: Secondary | ICD-10-CM | POA: Diagnosis not present

## 2022-05-01 DIAGNOSIS — K5790 Diverticulosis of intestine, part unspecified, without perforation or abscess without bleeding: Secondary | ICD-10-CM | POA: Diagnosis not present

## 2022-05-01 DIAGNOSIS — R413 Other amnesia: Secondary | ICD-10-CM | POA: Diagnosis not present

## 2022-05-01 DIAGNOSIS — G4733 Obstructive sleep apnea (adult) (pediatric): Secondary | ICD-10-CM | POA: Diagnosis not present

## 2022-05-01 DIAGNOSIS — E1169 Type 2 diabetes mellitus with other specified complication: Secondary | ICD-10-CM | POA: Diagnosis not present

## 2022-05-01 NOTE — Transitions of Care (Post Inpatient/ED Visit) (Signed)
   05/01/2022  Name: Heather [name] MRN: [id number] DOB: [birth year]  Today's TOC FU Call Status: Today's TOC FU Call Status:: Unsuccessul Call (1st Attempt) Unsuccessful Call (1st Attempt) Date: 05/01/22  Attempted to reach the patient regarding the most recent Inpatient/ED visit.  Follow Up Plan: Additional outreach attempts will be made to reach the patient to complete the Transitions of Care (Post Inpatient/ED visit) call.   [place] [facility] [phone]

## 2022-05-04 ENCOUNTER — Telehealth: Payer: Self-pay | Admitting: Internal Medicine

## 2022-05-04 ENCOUNTER — Telehealth: Payer: Self-pay | Admitting: *Deleted

## 2022-05-04 DIAGNOSIS — I110 Hypertensive heart disease with heart failure: Secondary | ICD-10-CM | POA: Diagnosis not present

## 2022-05-04 DIAGNOSIS — I509 Heart failure, unspecified: Secondary | ICD-10-CM | POA: Diagnosis not present

## 2022-05-04 DIAGNOSIS — Z48815 Encounter for surgical aftercare following surgery on the digestive system: Secondary | ICD-10-CM | POA: Diagnosis not present

## 2022-05-04 DIAGNOSIS — I2510 Atherosclerotic heart disease of native coronary artery without angina pectoris: Secondary | ICD-10-CM | POA: Diagnosis not present

## 2022-05-04 DIAGNOSIS — G8929 Other chronic pain: Secondary | ICD-10-CM | POA: Diagnosis not present

## 2022-05-04 DIAGNOSIS — E1142 Type 2 diabetes mellitus with diabetic polyneuropathy: Secondary | ICD-10-CM | POA: Diagnosis not present

## 2022-05-04 NOTE — Telephone Encounter (Signed)
[place]:  [place]   Phone Number:  [phone]   Needs Verbal orders for what service & frequency:  [facility] - 1 x week for 6 weeks

## 2022-05-04 NOTE — Telephone Encounter (Signed)
Verbals given to [place] today.

## 2022-05-04 NOTE — Telephone Encounter (Signed)
Okay for orders? 

## 2022-05-04 NOTE — Transitions of Care (Post Inpatient/ED Visit) (Signed)
   05/04/2022  Name: Heather [name] MRN: [id number] DOB: 10-16-1945  Today's TOC FU Call Status: Today's TOC FU Call Status:: Successful TOC FU Call Competed TOC FU Call Complete Date: 05/04/22  Transition Care Management Follow-up Telephone Call Date of Discharge: 04/29/22 Discharge Facility: [facility]) Type of Discharge: Inpatient Admission Primary Inpatient Discharge Diagnosis:: SBO How have you been since you were released from the hospital?: Better Any questions or concerns?: No  Items Reviewed: Did you receive and understand the discharge instructions provided?: Yes Medications obtained and verified?: Yes (Medications Reviewed) Any new allergies since your discharge?: No Dietary orders reviewed?: No Do you have support at home?: Yes People in Home: sibling(s) Name of Support/Comfort Primary Source: [facility] and Equipment/Supplies: [place] Ordered?: Yes Name of [place]:: [place] set up a time to come to your home?: Yes [place] Visit Date: 05/04/22 Any new equipment or medical supplies ordered?: No  Functional Questionnaire: Do you need assistance with bathing/showering or dressing?: Yes Do you need assistance with meal preparation?: Yes Do you need assistance with eating?: No Do you have difficulty maintaining continence: No Do you need assistance with getting out of bed/getting out of a chair/moving?: No Do you have difficulty managing or taking your medications?: No  Folllow up appointments reviewed: PCP Follow-up appointment confirmed?: Yes Date of PCP follow-up appointment?: 05/05/22 Follow-up Provider: Dr Quay Burow [id number] 2:40 Reason Specialist Follow-Up Not Confirmed: Patient has Specialist Provider Number and will Call for Appointment Do you need transportation to your follow-up appointment?: No Do you understand care options if your condition(s) worsen?: Yes-patient verbalized  understanding  SDOH Interventions Today    Flowsheet Row Most Recent Value  SDOH Interventions   Food Insecurity Interventions Intervention Not Indicated  Housing Interventions Intervention Not Indicated  Transportation Interventions Intervention Not Indicated      Interventions Today    Flowsheet Row Most Recent Value  General Interventions   General Interventions Discussed/Reviewed Doctor Visits  Doctor Visits Discussed/Reviewed Doctor Visits Reviewed, Doctor Visits Discussed  Pharmacy Interventions   Pharmacy Dicussed/Reviewed Pharmacy Topics Discussed, Pharmacy Topics Reviewed  [Discussed medications to stop and start]        [place] Management [phone]

## 2022-05-04 NOTE — Progress Notes (Signed)
Virtual Visit via Video Note  I connected with Heather [name] on 05/04/22 at  2:40 PM EST by a video enabled telemedicine application and verified that I am speaking with the correct person using two identifiers.   I discussed the limitations of evaluation and management by telemedicine and the availability of in person appointments. The patient expressed understanding and agreed to proceed.  Present for the visit:  Myself, Dr Cheryll Cockayne, Melonie [place].  The patient is currently at home and I am in the office.    No referring provider.    History of Present Illness:   Went to ED for abdominal pain - Ct scan showed distal SBO w/ a single point of transition w/in an umbilical hernia sac just proximal to the ileocecal valve.    S/p surgery 04/25/22 - open incarcerated hernia repair  umbilical w/ appendectomy ( appendix was found to be strangulated and necrotic)    Social History   Socioeconomic History   Marital status: Married    Spouse name: Darcel Bayley   Number of children: 2   Years of education: College   Highest education level: Not on file  Occupational History   Occupation: Retired  Tobacco Use   Smoking status: Former    Packs/day: 2.00    Years: 20.00    Total pack years: 40.00    Types: Cigarettes    Quit date: 03/03/1979    Years since quitting: 43.2   Smokeless tobacco: Never  Vaping Use   Vaping Use: Never used  Substance and Sexual Activity   Alcohol use: Yes    Comment: rare - TWICE A YR   Drug use: No   Sexual activity: Yes    Birth control/protection: Surgical  Other Topics Concern   Not on file  Social History Narrative   Patient is married Darcel Bayley).   Right Handed   Drinks 1 cup caffeine   Social Determinants of Health   Financial Resource Strain: Low Risk  (11/27/2020)   Overall Financial Resource Strain (CARDIA)    Difficulty of Paying Living Expenses: Not hard at all  Food Insecurity: No Food Insecurity (05/04/2022)   Hunger Vital Sign     Worried About Running Out of Food in the Last Year: Never true    Ran Out of Food in the Last Year: Never true  Transportation Needs: No Transportation Needs (05/04/2022)   PRAPARE - Administrator, Civil Service (Medical): No    Lack of Transportation (Non-Medical): No  Physical Activity: Sufficiently Active (11/24/2018)   Exercise Vital Sign    Days of Exercise per Week: 4 days    Minutes of Exercise per Session: 60 min  Stress: No Stress Concern Present (11/27/2020)   [employer] of Occupational Health - Occupational Stress Questionnaire    Feeling of Stress : Not at all  Social Connections: Unknown (11/27/2020)   Social Connection and Isolation Panel [NHANES]    Frequency of Communication with Friends and Family: Not on file    Frequency of Social Gatherings with Friends and Family: Not on file    Attends Religious Services: Not on file    Active Member of Clubs or Organizations: Not on file    Attends Banker Meetings: Not on file    Marital Status: Married     Observations/Objective: Appears well in NAD   Assessment and Plan:  See Problem List for Assessment and Plan of chronic medical problems.   Follow Up Instructions:  I discussed the assessment and treatment plan with the patient. The patient was provided an opportunity to ask questions and all were answered. The patient agreed with the plan and demonstrated an understanding of the instructions.   The patient was advised to call back or seek an in-person evaluation if the symptoms worsen or if the condition fails to improve as anticipated.    Pincus Sanes, MD This encounter was created in error - please disregard.

## 2022-05-04 NOTE — Telephone Encounter (Signed)
Message left today with verbals.

## 2022-05-04 NOTE — Telephone Encounter (Signed)
Cara from [facility] [facility] called for verbal orders for skilled nursing for post op care and monitoring with a frequency of:   1X for 6 weeks  Please call Moshe Salisbury to confirm: [phone]

## 2022-05-05 ENCOUNTER — Telehealth: Payer: Medicare Other | Admitting: Internal Medicine

## 2022-05-05 DIAGNOSIS — K420 Umbilical hernia with obstruction, without gangrene: Secondary | ICD-10-CM | POA: Diagnosis not present

## 2022-05-05 DIAGNOSIS — Z09 Encounter for follow-up examination after completed treatment for conditions other than malignant neoplasm: Secondary | ICD-10-CM | POA: Diagnosis not present

## 2022-05-05 DIAGNOSIS — Z9049 Acquired absence of other specified parts of digestive tract: Secondary | ICD-10-CM | POA: Diagnosis not present

## 2022-05-06 ENCOUNTER — Ambulatory Visit (INDEPENDENT_AMBULATORY_CARE_PROVIDER_SITE_OTHER): Payer: Medicare Other | Admitting: Family Medicine

## 2022-05-06 ENCOUNTER — Encounter (INDEPENDENT_AMBULATORY_CARE_PROVIDER_SITE_OTHER): Payer: Self-pay | Admitting: Family Medicine

## 2022-05-06 VITALS — BP 101/67 | HR 102 | Temp 97.9°F | Ht 64.0 in | Wt 199.0 lb

## 2022-05-06 DIAGNOSIS — Z6834 Body mass index (BMI) 34.0-34.9, adult: Secondary | ICD-10-CM | POA: Insufficient documentation

## 2022-05-06 DIAGNOSIS — Z6835 Body mass index (BMI) 35.0-35.9, adult: Secondary | ICD-10-CM | POA: Insufficient documentation

## 2022-05-06 DIAGNOSIS — E669 Obesity, unspecified: Secondary | ICD-10-CM

## 2022-05-06 DIAGNOSIS — R410 Disorientation, unspecified: Secondary | ICD-10-CM

## 2022-05-07 DIAGNOSIS — I2510 Atherosclerotic heart disease of native coronary artery without angina pectoris: Secondary | ICD-10-CM | POA: Diagnosis not present

## 2022-05-07 DIAGNOSIS — E1142 Type 2 diabetes mellitus with diabetic polyneuropathy: Secondary | ICD-10-CM | POA: Diagnosis not present

## 2022-05-07 DIAGNOSIS — G8929 Other chronic pain: Secondary | ICD-10-CM | POA: Diagnosis not present

## 2022-05-07 DIAGNOSIS — I110 Hypertensive heart disease with heart failure: Secondary | ICD-10-CM | POA: Diagnosis not present

## 2022-05-07 DIAGNOSIS — I509 Heart failure, unspecified: Secondary | ICD-10-CM | POA: Diagnosis not present

## 2022-05-07 DIAGNOSIS — Z48815 Encounter for surgical aftercare following surgery on the digestive system: Secondary | ICD-10-CM | POA: Diagnosis not present

## 2022-05-08 DIAGNOSIS — K420 Umbilical hernia with obstruction, without gangrene: Secondary | ICD-10-CM | POA: Diagnosis not present

## 2022-05-08 DIAGNOSIS — Z9889 Other specified postprocedural states: Secondary | ICD-10-CM | POA: Diagnosis not present

## 2022-05-11 ENCOUNTER — Encounter: Payer: Self-pay | Admitting: Internal Medicine

## 2022-05-11 DIAGNOSIS — I2510 Atherosclerotic heart disease of native coronary artery without angina pectoris: Secondary | ICD-10-CM | POA: Diagnosis not present

## 2022-05-11 DIAGNOSIS — E1142 Type 2 diabetes mellitus with diabetic polyneuropathy: Secondary | ICD-10-CM | POA: Diagnosis not present

## 2022-05-11 DIAGNOSIS — I509 Heart failure, unspecified: Secondary | ICD-10-CM | POA: Diagnosis not present

## 2022-05-11 DIAGNOSIS — I110 Hypertensive heart disease with heart failure: Secondary | ICD-10-CM | POA: Diagnosis not present

## 2022-05-11 DIAGNOSIS — G8929 Other chronic pain: Secondary | ICD-10-CM | POA: Diagnosis not present

## 2022-05-11 DIAGNOSIS — Z48815 Encounter for surgical aftercare following surgery on the digestive system: Secondary | ICD-10-CM | POA: Diagnosis not present

## 2022-05-11 NOTE — Progress Notes (Unsigned)
Virtual Visit via Video Note  I connected with Heather [name] on 05/11/22 at  1:20 PM EDT by a video enabled telemedicine application and verified that I am speaking with the correct person using two identifiers.   I discussed the limitations of evaluation and management by telemedicine and the availability of in person appointments. The patient expressed understanding and agreed to proceed.  Present for the visit:  Myself, Dr Billey Gosling, Heather [name].  The patient is currently at home and I am in the office.    No referring provider.    History of Present Illness: She is here for follow up of her recent surgery  ED 2/23 for abdominal pain - Ct scan showed distal small bowel obstruction that was in the abdominal wall hernia, the appendix was fluid-filled, dilated and hyperemic.  Some of the small and large intestines were in the thoracic compartment of her hiatal hernia w/o obstruction..     underwent open primary umbilical hernia repair with appendectomy on 04/25/22.  She developed a post-op wound infection that was drained by surgery in their office.    Social History   Socioeconomic History   Marital status: Married    Spouse name: Heather [name]   Number of children: 2   Years of education: College   Highest education level: Not on file  Occupational History   Occupation: Retired  Tobacco Use   Smoking status: Former    Packs/day: 2.00    Years: 20.00    Total pack years: 40.00    Types: Cigarettes    Quit date: 03/03/1979    Years since quitting: 43.2   Smokeless tobacco: Never  Vaping Use   Vaping Use: Never used  Substance and Sexual Activity   Alcohol use: Yes    Comment: rare - TWICE A YR   Drug use: No   Sexual activity: Yes    Birth control/protection: Surgical  Other Topics Concern   Not on file  Social History Narrative   Patient is married Heather [name]).   Right Handed   Drinks 1 cup caffeine   Social Determinants of Health   Financial Resource Strain: Low  Risk  (11/27/2020)   Overall Financial Resource Strain (CARDIA)    Difficulty of Paying Living Expenses: Not hard at all  Food Insecurity: No Food Insecurity (05/04/2022)   Hunger Vital Sign    Worried About Running Out of Food in the Last Year: Never true    Ran Out of Food in the Last Year: Never true  Transportation Needs: No Transportation Needs (05/04/2022)   PRAPARE - Hydrologist (Medical): No    Lack of Transportation (Non-Medical): No  Physical Activity: Sufficiently Active (11/24/2018)   Exercise Vital Sign    Days of Exercise per Week: 4 days    Minutes of Exercise per Session: 60 min  Stress: No Stress Concern Present (11/27/2020)   [place]    Feeling of Stress : Not at all  Social Connections: Unknown (11/27/2020)   Social Connection and Isolation Panel [NHANES]    Frequency of Communication with Friends and Family: Not on file    Frequency of Social Gatherings with Friends and Family: Not on file    Attends Religious Services: Not on file    Active Member of Clubs or Organizations: Not on file    Attends Archivist Meetings: Not on file    Marital Status: Married  Observations/Objective: Appears well in NAD   Assessment and Plan:  See Problem List for Assessment and Plan of chronic medical problems.   Follow Up Instructions:    I discussed the assessment and treatment plan with the patient. The patient was provided an opportunity to ask questions and all were answered. The patient agreed with the plan and demonstrated an understanding of the instructions.   The patient was advised to call back or seek an in-person evaluation if the symptoms worsen or if the condition fails to improve as anticipated.    [name], MD

## 2022-05-12 ENCOUNTER — Telehealth (INDEPENDENT_AMBULATORY_CARE_PROVIDER_SITE_OTHER): Payer: Medicare Other | Admitting: Internal Medicine

## 2022-05-12 DIAGNOSIS — K56609 Unspecified intestinal obstruction, unspecified as to partial versus complete obstruction: Secondary | ICD-10-CM

## 2022-05-12 NOTE — Assessment & Plan Note (Signed)
Status post surgery Distal small bowel obstruction that was in the abdominal wall hernia, had appendectomy as well due to necrotic appendix Had open primary umbilical hernia repair with appendectomy on 04/25/2022.  This was complicated by postop wound infection that was drained by surgery in the office Overall doing better, slow recovery Expect appetite to slowly improve Expect energy level to improve This whole process was overwhelming which does not surprise me She was also sicker than she realized and I think she is coming to grips with that She will continue to try to move is much as possible and continue to make sure she is eating on a regular basis Monitor weight She will call with any questions or concerns

## 2022-05-13 DIAGNOSIS — G8929 Other chronic pain: Secondary | ICD-10-CM | POA: Diagnosis not present

## 2022-05-13 DIAGNOSIS — F32A Depression, unspecified: Secondary | ICD-10-CM

## 2022-05-13 DIAGNOSIS — M47816 Spondylosis without myelopathy or radiculopathy, lumbar region: Secondary | ICD-10-CM

## 2022-05-13 DIAGNOSIS — J45909 Unspecified asthma, uncomplicated: Secondary | ICD-10-CM

## 2022-05-13 DIAGNOSIS — I2510 Atherosclerotic heart disease of native coronary artery without angina pectoris: Secondary | ICD-10-CM | POA: Diagnosis not present

## 2022-05-13 DIAGNOSIS — R413 Other amnesia: Secondary | ICD-10-CM

## 2022-05-13 DIAGNOSIS — K7581 Nonalcoholic steatohepatitis (NASH): Secondary | ICD-10-CM

## 2022-05-13 DIAGNOSIS — E1169 Type 2 diabetes mellitus with other specified complication: Secondary | ICD-10-CM | POA: Diagnosis not present

## 2022-05-13 DIAGNOSIS — G43009 Migraine without aura, not intractable, without status migrainosus: Secondary | ICD-10-CM

## 2022-05-13 DIAGNOSIS — F411 Generalized anxiety disorder: Secondary | ICD-10-CM | POA: Diagnosis not present

## 2022-05-13 DIAGNOSIS — Z6835 Body mass index (BMI) 35.0-35.9, adult: Secondary | ICD-10-CM

## 2022-05-13 DIAGNOSIS — Z7902 Long term (current) use of antithrombotics/antiplatelets: Secondary | ICD-10-CM

## 2022-05-13 DIAGNOSIS — Z48815 Encounter for surgical aftercare following surgery on the digestive system: Secondary | ICD-10-CM | POA: Diagnosis not present

## 2022-05-13 DIAGNOSIS — K5790 Diverticulosis of intestine, part unspecified, without perforation or abscess without bleeding: Secondary | ICD-10-CM

## 2022-05-13 DIAGNOSIS — D509 Iron deficiency anemia, unspecified: Secondary | ICD-10-CM | POA: Diagnosis not present

## 2022-05-13 DIAGNOSIS — H9319 Tinnitus, unspecified ear: Secondary | ICD-10-CM

## 2022-05-13 DIAGNOSIS — D510 Vitamin B12 deficiency anemia due to intrinsic factor deficiency: Secondary | ICD-10-CM

## 2022-05-13 DIAGNOSIS — Z9181 History of falling: Secondary | ICD-10-CM

## 2022-05-13 DIAGNOSIS — Z87891 Personal history of nicotine dependence: Secondary | ICD-10-CM

## 2022-05-13 DIAGNOSIS — M5116 Intervertebral disc disorders with radiculopathy, lumbar region: Secondary | ICD-10-CM

## 2022-05-13 DIAGNOSIS — G4733 Obstructive sleep apnea (adult) (pediatric): Secondary | ICD-10-CM

## 2022-05-13 DIAGNOSIS — M791 Myalgia, unspecified site: Secondary | ICD-10-CM | POA: Diagnosis not present

## 2022-05-13 DIAGNOSIS — I509 Heart failure, unspecified: Secondary | ICD-10-CM | POA: Diagnosis not present

## 2022-05-13 DIAGNOSIS — Z87440 Personal history of urinary (tract) infections: Secondary | ICD-10-CM

## 2022-05-13 DIAGNOSIS — Z96652 Presence of left artificial knee joint: Secondary | ICD-10-CM

## 2022-05-13 DIAGNOSIS — E1142 Type 2 diabetes mellitus with diabetic polyneuropathy: Secondary | ICD-10-CM | POA: Diagnosis not present

## 2022-05-13 DIAGNOSIS — E785 Hyperlipidemia, unspecified: Secondary | ICD-10-CM | POA: Diagnosis not present

## 2022-05-13 DIAGNOSIS — I110 Hypertensive heart disease with heart failure: Secondary | ICD-10-CM | POA: Diagnosis not present

## 2022-05-13 DIAGNOSIS — E559 Vitamin D deficiency, unspecified: Secondary | ICD-10-CM

## 2022-05-13 DIAGNOSIS — M1990 Unspecified osteoarthritis, unspecified site: Secondary | ICD-10-CM | POA: Diagnosis not present

## 2022-05-13 DIAGNOSIS — K219 Gastro-esophageal reflux disease without esophagitis: Secondary | ICD-10-CM

## 2022-05-14 DIAGNOSIS — Z9889 Other specified postprocedural states: Secondary | ICD-10-CM | POA: Diagnosis not present

## 2022-05-14 DIAGNOSIS — K420 Umbilical hernia with obstruction, without gangrene: Secondary | ICD-10-CM | POA: Diagnosis not present

## 2022-05-18 DIAGNOSIS — E1142 Type 2 diabetes mellitus with diabetic polyneuropathy: Secondary | ICD-10-CM | POA: Diagnosis not present

## 2022-05-18 DIAGNOSIS — I110 Hypertensive heart disease with heart failure: Secondary | ICD-10-CM | POA: Diagnosis not present

## 2022-05-18 DIAGNOSIS — G8929 Other chronic pain: Secondary | ICD-10-CM | POA: Diagnosis not present

## 2022-05-18 DIAGNOSIS — I2510 Atherosclerotic heart disease of native coronary artery without angina pectoris: Secondary | ICD-10-CM | POA: Diagnosis not present

## 2022-05-18 DIAGNOSIS — Z48815 Encounter for surgical aftercare following surgery on the digestive system: Secondary | ICD-10-CM | POA: Diagnosis not present

## 2022-05-18 DIAGNOSIS — I509 Heart failure, unspecified: Secondary | ICD-10-CM | POA: Diagnosis not present

## 2022-05-19 DIAGNOSIS — G8929 Other chronic pain: Secondary | ICD-10-CM | POA: Diagnosis not present

## 2022-05-19 DIAGNOSIS — Z9889 Other specified postprocedural states: Secondary | ICD-10-CM | POA: Diagnosis not present

## 2022-05-19 DIAGNOSIS — I509 Heart failure, unspecified: Secondary | ICD-10-CM | POA: Diagnosis not present

## 2022-05-19 DIAGNOSIS — K420 Umbilical hernia with obstruction, without gangrene: Secondary | ICD-10-CM | POA: Diagnosis not present

## 2022-05-19 DIAGNOSIS — E1142 Type 2 diabetes mellitus with diabetic polyneuropathy: Secondary | ICD-10-CM | POA: Diagnosis not present

## 2022-05-19 DIAGNOSIS — I110 Hypertensive heart disease with heart failure: Secondary | ICD-10-CM | POA: Diagnosis not present

## 2022-05-19 DIAGNOSIS — Z48815 Encounter for surgical aftercare following surgery on the digestive system: Secondary | ICD-10-CM | POA: Diagnosis not present

## 2022-05-19 DIAGNOSIS — I2510 Atherosclerotic heart disease of native coronary artery without angina pectoris: Secondary | ICD-10-CM | POA: Diagnosis not present

## 2022-05-19 NOTE — Progress Notes (Signed)
Chief Complaint:   OBESITY Heather [name] is here to discuss her progress with her obesity treatment plan along with follow-up of her obesity related diagnoses. Heather [name] is on following a lower carbohydrate, vegetable and lean protein rich diet plan and states she is following her eating plan approximately 0% of the time. Heather [name] states she is doing 0 minutes 0 times per week.  Today's visit was #: 82 Starting weight: 268 lbs Starting date: 12/23/2016 Today's weight: 199 lbs Today's date: 05/06/2022 Total lbs lost to date: 50 Total lbs lost since last in-office visit: 13  Interim History: Heather [name] has had hernia repair and appendectomy recently. She has lost 13 lbs due to this. She is feeling very foggy headed and confused. She was driven here by her husband. She is on pain medications and she doesn't remember much of the last month.   Subjective:   1. Confusion Heather [name] is post-op appendicitis and hernia repair. Her speech is slow and she has flights of ideation. She is on pain medications and muscle relaxers, but insisted she keep her appointment today.   Assessment/Plan:   1. Confusion I discussed her confusion and mental states changes with her husband. He agreed to watch her closely including pain medication to make sure she is not accidentally taking too much without his knowledge. He agreed to discuss this with her surgeon. She was transported to her car via wheelchair and her husband drove her home. We will plan to follow-up with weight loss when she is further recovered.   2. BMI 34.0-34.9,adult  3. Obesity, Beginning BMI 44.60 Heather [name] is currently in the action stage of change. As such, her goal is to continue with weight loss efforts. She has agreed to following a lower carbohydrate, vegetable and lean protein rich diet plan.   Heather [name] was encouraged to increase her water intake and she is ok to drink protein drinks. I discussed this with her husband who agreed to help her while recovering. We  will reassess in 1 month when she has recovered.   Behavioral modification strategies: increasing lean protein intake and increasing water intake.  Heather [name] has agreed to follow-up with our clinic in 4 weeks. She was informed of the importance of frequent follow-up visits to maximize her success with intensive lifestyle modifications for her multiple health conditions.   Objective:   Blood pressure 101/67, pulse (!) 102, temperature 97.9 F (36.6 C), height 5\' 4"  (1.626 m), weight 199 lb (90.3 kg), SpO2 96 %. Body mass index is 34.16 kg/m.  Lab Results  Component Value Date   CREATININE 0.56 04/30/2022   BUN 9 04/30/2022   NA 139 04/30/2022   K 3.4 (L) 04/30/2022   CL 104 04/30/2022   CO2 27 04/30/2022   Lab Results  Component Value Date   ALT 40 04/24/2022   AST 34 04/24/2022   ALKPHOS 54 04/24/2022   BILITOT 0.8 04/24/2022   Lab Results  Component Value Date   HGBA1C 6.1 11/21/2021   HGBA1C 5.3 04/14/2021   HGBA1C 5.8 (H) 03/07/2021   HGBA1C 6.1 (H) 11/25/2020   HGBA1C 5.9 06/21/2020   Lab Results  Component Value Date   INSULIN 6.0 11/25/2020   INSULIN 4.3 10/09/2019   INSULIN 8.6 07/05/2019   INSULIN 6.4 12/05/2018   INSULIN 3.6 04/27/2018   Lab Results  Component Value Date   TSH 1.770 04/14/2021   Lab Results  Component Value Date   CHOL 141 11/21/2021   HDL 61.60 11/21/2021  LDLCALC 62 11/21/2021   TRIG 87.0 11/21/2021   CHOLHDL 2 11/21/2021   Lab Results  Component Value Date   VD25OH 48.25 11/21/2021   VD25OH 55.3 04/14/2021   VD25OH 66.2 11/25/2020   Lab Results  Component Value Date   WBC 7.1 04/30/2022   HGB 11.9 (L) 04/30/2022   HCT 36.5 04/30/2022   MCV 95.5 04/30/2022   PLT 234 04/30/2022   Lab Results  Component Value Date   IRON 42 04/14/2021   TIBC 231 (L) 04/14/2021   FERRITIN 338 (H) 04/14/2021   Attestation Statements:   Reviewed by clinician on day of visit: allergies, medications, problem list, medical history,  surgical history, family history, social history, and previous encounter notes.  Time spent on visit including pre-visit chart review and post-visit care and charting was 30 minutes.   I, [name], am acting as transcriptionist for [name], MD.  I have reviewed the above documentation for accuracy and completeness, and I agree with the above. -  [name], MD

## 2022-05-20 DIAGNOSIS — I2510 Atherosclerotic heart disease of native coronary artery without angina pectoris: Secondary | ICD-10-CM | POA: Diagnosis not present

## 2022-05-20 DIAGNOSIS — I110 Hypertensive heart disease with heart failure: Secondary | ICD-10-CM | POA: Diagnosis not present

## 2022-05-20 DIAGNOSIS — E1142 Type 2 diabetes mellitus with diabetic polyneuropathy: Secondary | ICD-10-CM | POA: Diagnosis not present

## 2022-05-20 DIAGNOSIS — G8929 Other chronic pain: Secondary | ICD-10-CM | POA: Diagnosis not present

## 2022-05-20 DIAGNOSIS — I509 Heart failure, unspecified: Secondary | ICD-10-CM | POA: Diagnosis not present

## 2022-05-20 DIAGNOSIS — Z48815 Encounter for surgical aftercare following surgery on the digestive system: Secondary | ICD-10-CM | POA: Diagnosis not present

## 2022-05-21 ENCOUNTER — Telehealth: Payer: Self-pay

## 2022-05-21 DIAGNOSIS — Z48815 Encounter for surgical aftercare following surgery on the digestive system: Secondary | ICD-10-CM | POA: Diagnosis not present

## 2022-05-21 DIAGNOSIS — I2510 Atherosclerotic heart disease of native coronary artery without angina pectoris: Secondary | ICD-10-CM | POA: Diagnosis not present

## 2022-05-21 DIAGNOSIS — E1142 Type 2 diabetes mellitus with diabetic polyneuropathy: Secondary | ICD-10-CM | POA: Diagnosis not present

## 2022-05-21 DIAGNOSIS — G8929 Other chronic pain: Secondary | ICD-10-CM | POA: Diagnosis not present

## 2022-05-21 DIAGNOSIS — I110 Hypertensive heart disease with heart failure: Secondary | ICD-10-CM | POA: Diagnosis not present

## 2022-05-21 DIAGNOSIS — I509 Heart failure, unspecified: Secondary | ICD-10-CM | POA: Diagnosis not present

## 2022-05-21 NOTE — Telephone Encounter (Signed)
Contacted Heather [name] to schedule their annual wellness visit. Appointment made for 05/26/22.  Norton Blizzard, [place] ([facility])  [facility] [phone]

## 2022-05-22 DIAGNOSIS — I110 Hypertensive heart disease with heart failure: Secondary | ICD-10-CM | POA: Diagnosis not present

## 2022-05-22 DIAGNOSIS — I509 Heart failure, unspecified: Secondary | ICD-10-CM | POA: Diagnosis not present

## 2022-05-22 DIAGNOSIS — E1142 Type 2 diabetes mellitus with diabetic polyneuropathy: Secondary | ICD-10-CM | POA: Diagnosis not present

## 2022-05-22 DIAGNOSIS — Z48815 Encounter for surgical aftercare following surgery on the digestive system: Secondary | ICD-10-CM | POA: Diagnosis not present

## 2022-05-22 DIAGNOSIS — I2510 Atherosclerotic heart disease of native coronary artery without angina pectoris: Secondary | ICD-10-CM | POA: Diagnosis not present

## 2022-05-22 DIAGNOSIS — G8929 Other chronic pain: Secondary | ICD-10-CM | POA: Diagnosis not present

## 2022-05-24 ENCOUNTER — Encounter: Payer: Self-pay | Admitting: Internal Medicine

## 2022-05-24 DIAGNOSIS — R609 Edema, unspecified: Secondary | ICD-10-CM

## 2022-05-25 ENCOUNTER — Telehealth: Payer: Self-pay | Admitting: Internal Medicine

## 2022-05-25 DIAGNOSIS — I110 Hypertensive heart disease with heart failure: Secondary | ICD-10-CM | POA: Diagnosis not present

## 2022-05-25 DIAGNOSIS — Z48815 Encounter for surgical aftercare following surgery on the digestive system: Secondary | ICD-10-CM | POA: Diagnosis not present

## 2022-05-25 DIAGNOSIS — I2510 Atherosclerotic heart disease of native coronary artery without angina pectoris: Secondary | ICD-10-CM | POA: Diagnosis not present

## 2022-05-25 DIAGNOSIS — E1142 Type 2 diabetes mellitus with diabetic polyneuropathy: Secondary | ICD-10-CM | POA: Diagnosis not present

## 2022-05-25 DIAGNOSIS — G8929 Other chronic pain: Secondary | ICD-10-CM | POA: Diagnosis not present

## 2022-05-25 DIAGNOSIS — I509 Heart failure, unspecified: Secondary | ICD-10-CM | POA: Diagnosis not present

## 2022-05-25 MED ORDER — POTASSIUM CHLORIDE CRYS ER 20 MEQ PO TBCR: 20.0000 meq | EXTENDED_RELEASE_TABLET | Freq: Every day | ORAL | 3 refills | Status: DC

## 2022-05-25 NOTE — Telephone Encounter (Signed)
Heather [name] from [facility] called to report low BP reading. It was 90/46, but the patient was asymptomatic. She is experiencing fatigue. Patient's next appointment is tomorrow 05/26/22.  Best call back for Ebony Hail is [phone]).

## 2022-05-25 NOTE — Telephone Encounter (Signed)
Noted - will re-evaluate tomorrow

## 2022-05-25 NOTE — Patient Instructions (Addendum)
      Blood work was ordered.   The lab is on the first floor.    Medications changes include :   none     Return in about 6 months (around 11/26/2022) for follow up.

## 2022-05-25 NOTE — Telephone Encounter (Signed)
Per chart last filled by . Is this ok to send.Marland KitchenJohny Chess  McLean-Scocuzza, [name], MD

## 2022-05-25 NOTE — Progress Notes (Unsigned)
Subjective:    Patient ID: Heather [name], female    DOB: 1945-05-14, 77 y.o.   MRN: [id number]     HPI Alysson is here for follow up of her chronic medical problems.  Yesterday BP at home was very low -90/46- checked by PT.  She is fatigued, cold.  Appetite is still low - has improved, but not where it should be.  Not staying hydrated.    Taking furesoemide daily  Medications and allergies reviewed with patient and updated if appropriate.  Current Outpatient Medications on File Prior to Visit  Medication Sig Dispense Refill   acetaminophen (TYLENOL) 500 MG tablet Take 1,000 mg by mouth 3 (three) times daily as needed for moderate pain.     Boswellia-Glucosamine-Vit D (OSTEO BI-FLEX ONE PER DAY PO) Take 1 tablet by mouth daily.     Calcium Carb-Cholecalciferol (CHEWABLE CALCIUM/D3 PO) Take 1 tablet by mouth daily.     cetirizine (ZYRTEC) 10 MG tablet Take 10 mg by mouth daily.     clopidogrel (PLAVIX) 75 MG tablet TAKE 1 TABLET DAILY (Patient taking differently: Take 75 mg by mouth daily.) 90 tablet 0   Coenzyme Q10 (CO Q 10 PO) Take 400 mg by mouth.     Cyanocobalamin 1000 MCG CAPS Take 1 capsule by mouth daily.     ezetimibe (ZETIA) 10 MG tablet Take 1 tablet (10 mg total) by mouth daily. 90 tablet 3   Ferrous Sulfate (IRON) 325 (65 FE) MG TABS Take 1 tablet by mouth daily.     furosemide (LASIX) 40 MG tablet Take 1 tablet (40 mg total) by mouth daily as needed. (Patient taking differently: Take 40 mg by mouth daily as needed for fluid or edema.) 90 tablet 3   gabapentin (NEURONTIN) 100 MG capsule TAKE 3 CAPSULES BY MOUTH AT BEDTIME (Patient taking differently: Take 300 mg by mouth See admin instructions. TAKE 3 CAPSULES BY MOUTH AT BEDTIME) 270 capsule 3   Multiple Vitamin (MULTIVITAMIN) tablet Take 1 tablet by mouth daily.     Omega-3 Fatty Acids (FISH OIL PO) Take 2 g by mouth daily.     pantoprazole (PROTONIX) 20 MG tablet Take 1 tablet (20 mg total) by mouth 2 (two)  times daily. 30 min before food 180 tablet 3   polyethylene glycol (MIRALAX / GLYCOLAX) 17 g packet Take 17 g by mouth every other day.     potassium chloride SA (KLOR-CON M20) 20 MEQ tablet Take 1 tablet (20 mEq total) by mouth daily. 90 tablet 3   pyridoxine (B-6) 200 MG tablet Take 200 mg by mouth daily.     rosuvastatin (CRESTOR) 40 MG tablet Take 1 tablet (40 mg total) by mouth daily. At night 90 tablet 3   topiramate (TOPAMAX) 25 MG tablet TAKE 1 TABLET 3 TIMES A DAY 270 tablet 1   TURMERIC PO Take 538 mg by mouth daily.     Vitamin D, Ergocalciferol, (DRISDOL) 1.25 MG (50000 UNIT) CAPS capsule Take 1 capsule (50,000 Units total) by mouth every 7 (seven) days. (Patient taking differently: Take 50,000 Units by mouth every 7 (seven) days. mondays) 13 capsule 0   vitamin E 200 UNIT capsule Take 200 Units by mouth daily.     methocarbamol (ROBAXIN) 500 MG tablet Take 1 tablet (500 mg total) by mouth every 8 (eight) hours as needed for muscle spasms. (Patient not taking: Reported on 05/26/2022) 30 tablet 0   ondansetron (ZOFRAN) 4 MG tablet Take 1 tablet (  4 mg total) by mouth daily as needed for nausea or vomiting. (Patient not taking: Reported on 05/26/2022) 30 tablet 1   trospium (SANCTURA) 20 MG tablet Take 1 tablet (20 mg total) by mouth 2 (two) times daily. Prn overactive bladder (Patient not taking: Reported on 05/26/2022) 60 tablet 11   No current facility-administered medications on file prior to visit.     Review of Systems  Constitutional:  Positive for appetite change and fatigue.  Respiratory:  Negative for cough, shortness of breath and wheezing.   Cardiovascular:  Negative for chest pain, palpitations and leg swelling.  Gastrointestinal:  Positive for abdominal pain (occ cramping, occ pain from incision). Negative for blood in stool, constipation, diarrhea and nausea.       Gerd controlled, 1-2 BM a day formed  Endocrine: Positive for cold intolerance.  Neurological:  Negative for  light-headedness and headaches.       Objective:   Vitals:   05/26/22 1425  BP: 110/60  Pulse: 81  Temp: 98.4 F (36.9 C)  SpO2: 98%   BP Readings from Last 3 Encounters:  05/26/22 110/60  05/26/22 110/60  05/06/22 101/67   Wt Readings from Last 3 Encounters:  05/26/22 202 lb 9.6 oz (91.9 kg)  05/26/22 202 lb (91.6 kg)  05/06/22 199 lb (90.3 kg)   Body mass index is 34.78 kg/m.    Physical Exam Constitutional:      General: She is not in acute distress.    Appearance: Normal appearance.  HENT:     Head: Normocephalic and atraumatic.  Eyes:     Conjunctiva/sclera: Conjunctivae normal.  Cardiovascular:     Rate and Rhythm: Normal rate and regular rhythm.     Heart sounds: Normal heart sounds.  Pulmonary:     Effort: Pulmonary effort is normal. No respiratory distress.     Breath sounds: Normal breath sounds. No wheezing.  Musculoskeletal:     Cervical back: Neck supple.     Right lower leg: No edema.     Left lower leg: No edema.  Lymphadenopathy:     Cervical: No cervical adenopathy.  Skin:    General: Skin is warm and dry.     Findings: No rash.  Neurological:     Mental Status: She is alert. Mental status is at baseline.  Psychiatric:        Mood and Affect: Mood normal.        Behavior: Behavior normal.        Lab Results  Component Value Date   WBC 7.1 04/30/2022   HGB 11.9 (L) 04/30/2022   HCT 36.5 04/30/2022   PLT 234 04/30/2022   GLUCOSE 100 (H) 04/30/2022   CHOL 141 11/21/2021   TRIG 87.0 11/21/2021   HDL 61.60 11/21/2021   LDLCALC 62 11/21/2021   ALT 40 04/24/2022   AST 34 04/24/2022   NA 139 04/30/2022   K 3.4 (L) 04/30/2022   CL 104 04/30/2022   CREATININE 0.56 04/30/2022   BUN 9 04/30/2022   CO2 27 04/30/2022   TSH 1.770 04/14/2021   INR 1.1 04/26/2018   HGBA1C 6.1 11/21/2021   MICROALBUR <0.7 11/21/2021     Assessment & Plan:    See Problem List for Assessment and Plan of chronic medical problems.

## 2022-05-26 ENCOUNTER — Ambulatory Visit (INDEPENDENT_AMBULATORY_CARE_PROVIDER_SITE_OTHER): Payer: Medicare Other | Admitting: Internal Medicine

## 2022-05-26 ENCOUNTER — Encounter: Payer: Self-pay | Admitting: Internal Medicine

## 2022-05-26 ENCOUNTER — Ambulatory Visit (INDEPENDENT_AMBULATORY_CARE_PROVIDER_SITE_OTHER): Payer: Medicare Other

## 2022-05-26 VITALS — BP 110/60 | HR 81 | Temp 98.4°F | Ht 64.0 in | Wt 202.6 lb

## 2022-05-26 VITALS — BP 110/60 | HR 81 | Temp 98.4°F | Ht 64.0 in | Wt 202.0 lb

## 2022-05-26 DIAGNOSIS — E1169 Type 2 diabetes mellitus with other specified complication: Secondary | ICD-10-CM

## 2022-05-26 DIAGNOSIS — Z9861 Coronary angioplasty status: Secondary | ICD-10-CM

## 2022-05-26 DIAGNOSIS — I10 Essential (primary) hypertension: Secondary | ICD-10-CM | POA: Diagnosis not present

## 2022-05-26 DIAGNOSIS — Z0000 Encounter for general adult medical examination without abnormal findings: Secondary | ICD-10-CM | POA: Diagnosis not present

## 2022-05-26 DIAGNOSIS — E785 Hyperlipidemia, unspecified: Secondary | ICD-10-CM

## 2022-05-26 DIAGNOSIS — I959 Hypotension, unspecified: Secondary | ICD-10-CM

## 2022-05-26 DIAGNOSIS — E049 Nontoxic goiter, unspecified: Secondary | ICD-10-CM

## 2022-05-26 DIAGNOSIS — K219 Gastro-esophageal reflux disease without esophagitis: Secondary | ICD-10-CM | POA: Diagnosis not present

## 2022-05-26 DIAGNOSIS — G6281 Critical illness polyneuropathy: Secondary | ICD-10-CM

## 2022-05-26 DIAGNOSIS — E119 Type 2 diabetes mellitus without complications: Secondary | ICD-10-CM

## 2022-05-26 DIAGNOSIS — I2510 Atherosclerotic heart disease of native coronary artery without angina pectoris: Secondary | ICD-10-CM | POA: Diagnosis not present

## 2022-05-26 DIAGNOSIS — R600 Localized edema: Secondary | ICD-10-CM

## 2022-05-26 DIAGNOSIS — D509 Iron deficiency anemia, unspecified: Secondary | ICD-10-CM | POA: Diagnosis not present

## 2022-05-26 LAB — CBC WITH DIFFERENTIAL/PLATELET
Basophils Absolute: 0 10*3/uL (ref 0.0–0.1)
Basophils Relative: 0.7 % (ref 0.0–3.0)
Eosinophils Absolute: 0.1 10*3/uL (ref 0.0–0.7)
Eosinophils Relative: 1.1 % (ref 0.0–5.0)
HCT: 36.5 % (ref 36.0–46.0)
Hemoglobin: 12 g/dL (ref 12.0–15.0)
Lymphocytes Relative: 28.8 % (ref 12.0–46.0)
Lymphs Abs: 1.4 10*3/uL (ref 0.7–4.0)
MCHC: 32.8 g/dL (ref 30.0–36.0)
MCV: 95.2 fl (ref 78.0–100.0)
Monocytes Absolute: 0.5 10*3/uL (ref 0.1–1.0)
Monocytes Relative: 10.1 % (ref 3.0–12.0)
Neutro Abs: 2.8 10*3/uL (ref 1.4–7.7)
Neutrophils Relative %: 59.3 % (ref 43.0–77.0)
Platelets: 246 10*3/uL (ref 150.0–400.0)
RBC: 3.84 Mil/uL — ABNORMAL LOW (ref 3.87–5.11)
RDW: 15.9 % — ABNORMAL HIGH (ref 11.5–15.5)
WBC: 4.8 10*3/uL (ref 4.0–10.5)

## 2022-05-26 LAB — COMPREHENSIVE METABOLIC PANEL
ALT: 16 U/L (ref 0–35)
AST: 21 U/L (ref 0–37)
Albumin: 3.6 g/dL (ref 3.5–5.2)
Alkaline Phosphatase: 64 U/L (ref 39–117)
BUN: 10 mg/dL (ref 6–23)
CO2: 30 mEq/L (ref 19–32)
Calcium: 9 mg/dL (ref 8.4–10.5)
Chloride: 104 mEq/L (ref 96–112)
Creatinine, Ser: 0.72 mg/dL (ref 0.40–1.20)
GFR: 81.69 mL/min (ref >60.00)
Glucose, Bld: 87 mg/dL (ref 70–99)
Potassium: 3.6 mEq/L (ref 3.5–5.1)
Sodium: 140 mEq/L (ref 135–145)
Total Bilirubin: 0.3 mg/dL (ref 0.2–1.2)
Total Protein: 7.5 g/dL (ref 6.0–8.3)

## 2022-05-26 LAB — IBC PANEL
Iron: 49 ug/dL (ref 42–145)
Saturation Ratios: 23.2 % (ref 20.0–50.0)
TIBC: 211.4 ug/dL — ABNORMAL LOW (ref 250.0–450.0)
Transferrin: 151 mg/dL — ABNORMAL LOW (ref 212.0–360.0)

## 2022-05-26 LAB — LIPID PANEL
Cholesterol: 145 mg/dL (ref 0–200)
HDL: 62.5 mg/dL (ref >39.00)
LDL Cholesterol: 60 mg/dL (ref 0–99)
NonHDL: 82.61
Total CHOL/HDL Ratio: 2
Triglycerides: 111 mg/dL (ref 0.0–149.0)
VLDL: 22.2 mg/dL (ref 0.0–40.0)

## 2022-05-26 LAB — FERRITIN: Ferritin: 288 ng/mL (ref 10.0–291.0)

## 2022-05-26 LAB — TSH: TSH: 0.62 u[IU]/mL (ref 0.35–5.50)

## 2022-05-26 MED ORDER — EZETIMIBE 10 MG PO TABS: 10.0000 mg | ORAL_TABLET | Freq: Every day | ORAL | 3 refills | Status: DC

## 2022-05-26 MED ORDER — ROSUVASTATIN CALCIUM 40 MG PO TABS: 40.0000 mg | ORAL_TABLET | Freq: Every day | ORAL | 3 refills | Status: DC

## 2022-05-26 MED ORDER — GABAPENTIN 100 MG PO CAPS: ORAL_CAPSULE | ORAL | 3 refills | Status: DC

## 2022-05-26 MED ORDER — PANTOPRAZOLE SODIUM 20 MG PO TBEC: 20.0000 mg | DELAYED_RELEASE_TABLET | Freq: Two times a day (BID) | ORAL | 3 refills | Status: DC

## 2022-05-26 MED ORDER — FUROSEMIDE 40 MG PO TABS: 40.0000 mg | ORAL_TABLET | Freq: Every day | ORAL | 3 refills | Status: DC

## 2022-05-26 NOTE — Assessment & Plan Note (Signed)
Chronic Regular exercise and healthy diet encouraged Check lipid panel  Continue Zetia 10 mg daily, Crestor 40 mg daily 

## 2022-05-26 NOTE — Assessment & Plan Note (Signed)
Chronic Taking iron Iron panel, cbc  

## 2022-05-26 NOTE — Assessment & Plan Note (Addendum)
Chronic Controlled Continue furosemide 40 mg daily- can try 40 mg alternating wit 20mg   - may help possible dehydration/hypovolemnia

## 2022-05-26 NOTE — Assessment & Plan Note (Signed)
Chronic Tsh

## 2022-05-26 NOTE — Progress Notes (Addendum)
Subjective:   Heather [name] is a 77 y.o. female who presents for Medicare Annual (Subsequent) preventive examination.  Review of Systems     Cardiac Risk Factors include: advanced age (>53men, >85 women);dyslipidemia;family history of premature cardiovascular disease;hypertension;obesity (BMI >30kg/m2);sedentary lifestyle     Objective:    Today's Vitals   05/26/22 1439 05/26/22 1456  BP: 110/60   Pulse: 81   Temp: 98.4 F (36.9 C)   TempSrc: Temporal   SpO2: 98%   Weight: 202 lb (91.6 kg)   Height: 5\' 4"  (1.626 m)   PainSc: 0-No pain 0-No pain   Body mass index is 34.67 kg/m.     05/26/2022    2:59 PM 03/07/2021    1:21 PM 11/27/2020   11:06 AM 12/07/2019   11:25 AM 11/27/2019   12:06 PM 11/24/2018   11:51 AM 03/16/2017   11:10 AM  Advanced Directives  Does Patient Have a Medical Advance Directive? Yes Yes Yes No No No No  Type of Paramedic of [place];Living will [place];Living will [place];Living will      Does patient want to make changes to medical advance directive?  No - Patient declined No - Patient declined      Copy of [place] in Chart? No - copy requested No - copy requested No - copy requested      Would patient like information on creating a medical advance directive?    No - Patient declined No - Patient declined Yes (MAU/Ambulatory/Procedural Areas - Information given) No - Patient declined    Current Medications (verified) Outpatient Encounter Medications as of 05/26/2022  Medication Sig   acetaminophen (TYLENOL) 500 MG tablet Take 1,000 mg by mouth 3 (three) times daily as needed for moderate pain.   Boswellia-Glucosamine-Vit D (OSTEO BI-FLEX ONE PER DAY PO) Take 1 tablet by mouth daily.   Calcium Carb-Cholecalciferol (CHEWABLE CALCIUM/D3 PO) Take 1 tablet by mouth daily.   cetirizine (ZYRTEC) 10 MG tablet Take 10 mg by mouth daily.   clopidogrel (PLAVIX) 75 MG  tablet TAKE 1 TABLET DAILY (Patient taking differently: Take 75 mg by mouth daily.)   Coenzyme Q10 (CO Q 10 PO) Take 400 mg by mouth.   Cyanocobalamin 1000 MCG CAPS Take 1 capsule by mouth daily.   ezetimibe (ZETIA) 10 MG tablet Take 1 tablet (10 mg total) by mouth daily.   Ferrous Sulfate (IRON) 325 (65 FE) MG TABS Take 1 tablet by mouth daily.   furosemide (LASIX) 40 MG tablet Take 1 tablet (40 mg total) by mouth daily as needed. (Patient taking differently: Take 40 mg by mouth daily as needed for fluid or edema.)   gabapentin (NEURONTIN) 100 MG capsule TAKE 3 CAPSULES BY MOUTH AT BEDTIME (Patient taking differently: Take 300 mg by mouth See admin instructions. TAKE 3 CAPSULES BY MOUTH AT BEDTIME)   methocarbamol (ROBAXIN) 500 MG tablet Take 1 tablet (500 mg total) by mouth every 8 (eight) hours as needed for muscle spasms. (Patient not taking: Reported on 05/26/2022)   Multiple Vitamin (MULTIVITAMIN) tablet Take 1 tablet by mouth daily.   Omega-3 Fatty Acids (FISH OIL PO) Take 2 g by mouth daily.   ondansetron (ZOFRAN) 4 MG tablet Take 1 tablet (4 mg total) by mouth daily as needed for nausea or vomiting. (Patient not taking: Reported on 05/26/2022)   pantoprazole (PROTONIX) 20 MG tablet Take 1 tablet (20 mg total) by mouth 2 (two) times daily.  30 min before food   polyethylene glycol (MIRALAX / GLYCOLAX) 17 g packet Take 17 g by mouth every other day.   potassium chloride SA (KLOR-CON M20) 20 MEQ tablet Take 1 tablet (20 mEq total) by mouth daily.   pyridoxine (B-6) 200 MG tablet Take 200 mg by mouth daily.   rosuvastatin (CRESTOR) 40 MG tablet Take 1 tablet (40 mg total) by mouth daily. At night   topiramate (TOPAMAX) 25 MG tablet TAKE 1 TABLET 3 TIMES A DAY   trospium (SANCTURA) 20 MG tablet Take 1 tablet (20 mg total) by mouth 2 (two) times daily. Prn overactive bladder (Patient not taking: Reported on 05/26/2022)   TURMERIC PO Take 538 mg by mouth daily.   Vitamin D, Ergocalciferol, (DRISDOL)  1.25 MG (50000 UNIT) CAPS capsule Take 1 capsule (50,000 Units total) by mouth every 7 (seven) days. (Patient taking differently: Take 50,000 Units by mouth every 7 (seven) days. mondays)   vitamin E 200 UNIT capsule Take 200 Units by mouth daily.   No facility-administered encounter medications on file as of 05/26/2022.    Allergies (verified) Aspirin, Coconut (cocos nucifera), Lisinopril, Metoprolol, Other, Peanut-containing drug products, Prednisone, Strawberry extract, and Penicillins   History: Past Medical History:  Diagnosis Date   Abdominal hernia    Abnormal Pap smear    ALLERGIC RHINITIS 10/22/2006   Allergy    ANEMIA 12/18/2008   Arthritis    scoliosis moderate deg changes lumbar Xray 11/04/17    Asthma    ASYMPTOMATIC POSTMENOPAUSAL STATUS 11/22/2007   Back pain    CAD (coronary artery disease)    in LAD   CHF (congestive heart failure) ([place])    Complication of anesthesia 2010   pt states she woke up during colonoscopy and was aware of "losing control of breathing" during mammoplasty   Degenerative arthritis    Diverticulosis    Dyslipidemia    Fibroid    Frequent headaches    GERD 07/20/2007   GOITER, MULTINODULAR 07/20/2007   Headache(784.0) 07/20/2007   HEARING LOSS 11/22/2007   Hiatal hernia    Hiatal hernia    large   History of chicken pox    Hx of colposcopy with cervical biopsy    HYPERCHOLESTEROLEMIA 01/13/2007   Hyperglycemia    HYPERTENSION 10/22/2006   Lung nodule    unchanged since 05/26/13    Migraines    NASH (nonalcoholic steatohepatitis)    Nocturnal hypoxemia 02/17/2013   Obesity    Obstructive sleep apnea    OSA on CPAP    per neurology   OSTEOARTHRITIS 10/22/2006   Other chronic nonalcoholic liver disease 123456   Pre-diabetes    Sedimentation rate elevation    Sleep apnea    on cpap   Urine incontinence    UTI (urinary tract infection)    Past Surgical History:  Procedure Laterality Date   BREAST SURGERY  1984   Breast  reduction b/l    CATARACT EXTRACTION, BILATERAL     DEXA  08/2005   DILATION AND CURETTAGE OF UTERUS     ELECTROCARDIOGRAM  10/15/2006   ESOPHAGOGASTRODUODENOSCOPY  12/08/2005   FOOT SURGERY     hammertoe and bunion 05/2017    Stress Cardiolite  10/21/2005   sweat gland removal     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   Procedure: Left TOTAL KNEE ARTHROPLASTY;  Surgeon: Mcarthur Rossetti, MD;  Location: [facility];  Service: Orthopedics;  Laterality: Left;   UMBILICAL HERNIA REPAIR N/A 04/25/2022  Procedure: OPEN INCARCERATED HERNIA REPAIR UMBILICAL WITH APPENDECTOMY;  Surgeon: Stechschulte, Nickola Major, MD;  Location: [facility];  Service: General;  Laterality: N/A;   WISDOM TOOTH EXTRACTION     Family History  Problem Relation Age of Onset   Asthma Mother    Depression Mother    Bipolar disorder Mother    Dementia Mother    Arthritis Mother    Breast cancer Mother        primary   Colon cancer Mother        mets from breast   Cancer Mother        colon   Hypertension Father    Migraines Father    Cancer Maternal Aunt        ?   Heart disease Maternal Grandmother    Cancer Maternal Grandfather        stomach ?   Sleep apnea Neg Hx    Esophageal cancer Neg Hx    Stomach cancer Neg Hx    Rectal cancer Neg Hx    Social History   Socioeconomic History   Marital status: Married    Spouse name: [name]   Number of children: 2   Years of education: College   Highest education level: Not on file  Occupational History   Occupation: Retired  Tobacco Use   Smoking status: Former    Packs/day: 2.00    Years: 20.00    Additional pack years: 0.00    Total pack years: 40.00    Types: Cigarettes    Quit date: 03/03/1979    Years since quitting: 43.2   Smokeless tobacco: Never  Vaping Use   Vaping Use: Never used  Substance and Sexual Activity   Alcohol use: Yes    Comment: rare - TWICE A YR   Drug use: No   Sexual activity: Yes    Birth control/protection: Surgical  Other  Topics Concern   Not on file  Social History Narrative   Patient is married [name]).   Right Handed   Drinks 1 cup caffeine   Social Determinants of Health   Financial Resource Strain: Low Risk  (05/26/2022)   Overall Financial Resource Strain (CARDIA)    Difficulty of Paying Living Expenses: Not hard at all  Food Insecurity: No Food Insecurity (05/26/2022)   Hunger Vital Sign    Worried About Running Out of Food in the Last Year: Never true    Ran Out of Food in the Last Year: Never true  Transportation Needs: No Transportation Needs (05/26/2022)   PRAPARE - Hydrologist (Medical): No    Lack of Transportation (Non-Medical): No  Physical Activity: Inactive (05/26/2022)   Exercise Vital Sign    Days of Exercise per Week: 0 days    Minutes of Exercise per Session: 0 min  Stress: Patient Declined (05/26/2022)   [place]    Feeling of Stress : Patient declined  Social Connections: Unknown (05/26/2022)   Social Connection and Isolation Panel [NHANES]    Frequency of Communication with Friends and Family: More than three times a week    Frequency of Social Gatherings with Friends and Family: Once a week    Attends Religious Services: Not on Diplomatic Services operational officer of Clubs or Organizations: Yes    Attends Archivist Meetings: More than 4 times per year    Marital Status: Married    Tobacco Counseling Counseling given:  Not Answered   Clinical Intake:  Pre-visit preparation completed: Yes  Pain : No/denies pain Pain Score: 0-No pain     BMI - recorded: 34.67 Nutritional Status: BMI > 30  Obese Nutritional Risks: None Diabetes: No  How often do you need to have someone help you when you read instructions, pamphlets, or other written materials from your doctor or pharmacy?: 3 - Sometimes What is the last grade level you completed in school?: Bachelor's Degree  Diabetic?  No  Interpreter Needed?: No  Information entered by :: Lisette Abu, LPN.   Activities of Daily Living    05/26/2022    3:00 PM 05/26/2022   11:23 AM  In your present state of health, do you have any difficulty performing the following activities:  Hearing? 0 0  Vision? 0 0  Difficulty concentrating or making decisions? 0 0  Walking or climbing stairs? 1 1  Dressing or bathing? 1 1  Doing errands, shopping? 1 1  Preparing Food and eating ? N N  Using the Toilet? N   In the past six months, have you accidently leaked urine? Y   Do you have problems with loss of bowel control? N   Managing your Medications? N   Managing your Finances? N   Housekeeping or managing your Housekeeping? Y     Patient Care Team: [name], MD as PCP - General (Internal Medicine) Larey Dresser, MD as PCP - Cardiology (Cardiology) Kathrynn Ducking, MD (Inactive) (Neurology) Haygood, Seymour Bars, MD (Inactive) (Obstetrics and Gynecology) [place], Heather, [place] (Optometry) Brand Males, MD as Consulting Physician (Pulmonary Disease) Lahoma Rocker, MD as Referring Physician (Rheumatology) Larey Dresser, MD as Consulting Physician (Cardiology)  Indicate any recent Medical Services you may have received from other than [facility] providers in the past year (date may be approximate).     Assessment:   This is a routine wellness examination for Lexiana.  Hearing/Vision screen Hearing Screening - Comments:: Denies hearing difficulties   Vision Screening - Comments:: Wears rx glasses - up to date with routine eye exams with Heather [place], OD.   Dietary issues and exercise activities discussed: Current Exercise Habits: The patient does not participate in regular exercise at present, Exercise limited by: orthopedic condition(s);respiratory conditions(s)   Goals Addressed             This Visit's Progress    Client understands the importance of follow-up with providers by attending scheduled  visits        Depression Screen    05/26/2022    2:57 PM 04/23/2022   10:08 AM 10/29/2021    3:31 PM 10/24/2021    4:06 PM 07/02/2021    1:21 PM 01/02/2021   11:46 AM 11/27/2020   10:59 AM  PHQ 2/9 Scores  PHQ - 2 Score 1 0 0 1 1 1  0    Fall Risk    05/26/2022    2:59 PM 05/26/2022   11:23 AM 04/23/2022   10:08 AM 10/29/2021    3:31 PM 07/02/2021    1:21 PM  [place] in the past year? 0 0 0 0 0  Number falls in past yr: 0 0 0 0 0  Injury with Fall? 0 0 0 0 0  Risk for fall due to : No Fall Risks  No Fall Risks No Fall Risks No Fall Risks  Follow up Falls prevention discussed  Falls evaluation completed Falls evaluation completed Falls evaluation completed  FALL RISK PREVENTION PERTAINING TO THE HOME:  Any stairs in or around the home? Yes  If so, are there any without handrails? No  Home free of loose throw rugs in walkways, pet beds, electrical cords, etc? Yes  Adequate lighting in your home to reduce risk of falls? Yes   ASSISTIVE DEVICES UTILIZED TO PREVENT FALLS:  Life alert? No  Use of a cane, walker or w/c? Yes  Grab bars in the bathroom? Yes  Shower chair or bench in shower? Yes  Elevated toilet seat or a handicapped toilet? Yes   TIMED UP AND GO:  Was the test performed? Yes .  Length of time to ambulate 10 feet: 10 sec.   Gait steady and fast without use of assistive device  Cognitive Function:    12/16/2021    1:46 PM 01/14/2021   11:37 AM 08/12/2020   10:21 AM 02/08/2020   10:14 AM  MMSE - Mini Mental State Exam  Orientation to time 5 4 5 5   Orientation to Place 5 5 5 5   Registration 3 3 3 3   Attention/ Calculation 5 4 5 5   Recall 3 3 3 3   Language- name 2 objects 2 2 2 2   Language- repeat 1 1 1 1   Language- follow 3 step command 3 3 3 3   Language- read & follow direction 1 1 1 1   Write a sentence 1 1 1 1   Copy design 1 1 1 1   Total score 30 28 30 30         05/26/2022    3:00 PM 11/24/2018   12:03 PM  6CIT Screen  What Year? 0  points 0 points  What month? 0 points 0 points  What time? 0 points 0 points  Count back from 20 0 points 0 points  Months in reverse 0 points 0 points  Repeat phrase 0 points 0 points  Total Score 0 points 0 points    Immunizations Immunization History  Administered Date(s) Administered   Fluad Quad(high Dose 65+) 12/13/2018, 11/08/2019, 01/02/2021, 11/21/2021   Influenza Split 12/25/2010, 12/28/2011   Influenza Whole 12/18/2008, 12/03/2009   Influenza, High Dose Seasonal PF 03/05/2016, 12/29/2016, 01/05/2018   Influenza,inj,Quad PF,6+ Mos 01/24/2013, 01/30/2014, 02/15/2015   PFIZER Comirnaty(Gray Top)Covid-19 Tri-Sucrose Vaccine 06/28/2020   PFIZER(Purple Top)SARS-COV-2 Vaccination 04/06/2019, 04/27/2019, 12/07/2019   Pneumococcal Conjugate-13 01/30/2014   Pneumococcal Polysaccharide-23 12/18/2008, 01/18/2019   Tdap 01/30/2014   Zoster, Live 01/30/2014    TDAP status: Up to date  Flu Vaccine status: Up to date  Pneumococcal vaccine status: Up to date  Covid-19 vaccine status: Completed vaccines  Qualifies for Shingles Vaccine? Yes   Zostavax completed Yes   Shingrix Completed?: No.    Education has been provided regarding the importance of this vaccine. Patient has been advised to call insurance company to determine out of pocket expense if they have not yet received this vaccine. Advised may also receive vaccine at local pharmacy or Health Dept. Verbalized acceptance and understanding.  Screening Tests Health Maintenance  Topic Date Due   Zoster Vaccines- Shingrix (1 of 2) Never done   OPHTHALMOLOGY EXAM  07/23/2021   FOOT EXAM  09/26/2021   COVID-19 Vaccine (5 - 2023-24 season) 10/31/2021   HEMOGLOBIN A1C  05/22/2022   Diabetic kidney evaluation - Urine ACR  11/22/2022   Diabetic kidney evaluation - eGFR measurement  04/30/2023   Medicare Annual Wellness (AWV)  05/26/2023   DTaP/Tdap/Td (2 - Td or Tdap) 01/31/2024   Pneumonia Vaccine 65+  Years old  Completed    INFLUENZA VACCINE  Completed   DEXA SCAN  Completed   Hepatitis C Screening  Completed   HPV VACCINES  Aged Out   COLONOSCOPY (Pts 45-60yrs Insurance coverage will need to be confirmed)  Discontinued    Health Maintenance  Health Maintenance Due  Topic Date Due   Zoster Vaccines- Shingrix (1 of 2) Never done   OPHTHALMOLOGY EXAM  07/23/2021   FOOT EXAM  09/26/2021   COVID-19 Vaccine (5 - 2023-24 season) 10/31/2021   HEMOGLOBIN A1C  05/22/2022    Colorectal cancer screening: No longer required.   Mammogram status: Completed 06/2021. Repeat every year  Bone Density status: Completed 03/06/2015. Results reflect: Bone density results: NORMAL. Repeat every 5 years.  Lung Cancer Screening: (Low Dose CT Chest recommended if Age 55-80 years, 30 pack-year currently smoking OR have quit w/in 15years.) does not qualify.   Lung Cancer Screening Referral: no  Additional Screening:  Hepatitis C Screening: does qualify; Completed 12/28/2011  Vision Screening: Recommended annual ophthalmology exams for early detection of glaucoma and other disorders of the eye. Is the patient up to date with their annual eye exam?  Yes  Who is the provider or what is the name of the office in which the patient attends annual eye exams? Heather [place], OD. If pt is not established with a provider, would they like to be referred to a provider to establish care? No .   Dental Screening: Recommended annual dental exams for proper oral hygiene  Community Resource Referral / Chronic Care Management: CRR required this visit?  No   CCM required this visit?  No      Plan:     I have personally reviewed and noted the following in the patient's chart:   Medical and social history Use of alcohol, tobacco or illicit drugs  Current medications and supplements including opioid prescriptions. Patient is not currently taking opioid prescriptions. Functional ability and status Nutritional status Physical  activity Advanced directives List of other physicians Hospitalizations, surgeries, and ER visits in previous 12 months Vitals Screenings to include cognitive, depression, and falls Referrals and appointments  In addition, I have reviewed and discussed with patient certain preventive protocols, quality metrics, and best practice recommendations. A written personalized care plan for preventive services as well as general preventive health recommendations were provided to patient.     Sheral Flow, LPN   [id number]   Nurse Notes:  Normal cognitive status assessed by direct observation by this Nurse Health Advisor. No abnormalities found.

## 2022-05-26 NOTE — Assessment & Plan Note (Signed)
Chronic CAD seen on imaging Following with Dr. Benjamine Mola On Plavix 75 mg daily, Zetia 10 mg daily and Crestor 40 mg daily Continue above medications

## 2022-05-26 NOTE — Assessment & Plan Note (Signed)
Chronic Has lost a lot of weight before, during and after her recent hospitalization and surgery BP yesterday at home was low-90/46 BP at home Currently not take any blood pressure medications, but does take furosemide 40 mg daily as needed

## 2022-05-26 NOTE — Assessment & Plan Note (Signed)
Chronic GERD controlled Continue pantoprazole 20 mg bid  

## 2022-05-26 NOTE — Assessment & Plan Note (Addendum)
Chronic Neuropathy controlled Continue gabapentin 300 mg nightly

## 2022-05-26 NOTE — Assessment & Plan Note (Signed)
BP yesterday at home - taken by PT was 90/46 Today BP is better Not on any BP meds Likely mild dehydration - stressed drinking more fluids R/o infection w/ labs- cbc, cmp

## 2022-05-26 NOTE — Assessment & Plan Note (Signed)
Chronic   Lab Results  Component Value Date   HGBA1C 6.1 11/21/2021   Sugars very well controlled Check A1c, urine microalbumin today Continue lifestyle control Stressed regular exercise, diabetic diet

## 2022-05-26 NOTE — Patient Instructions (Signed)
Heather [name] , Thank you for taking time to come for your Medicare Wellness Visit. I appreciate your ongoing commitment to your health goals. Please review the following plan we discussed and let me know if I can assist you in the future.   These are the goals we discussed:  Goals      Client understands the importance of follow-up with providers by attending scheduled visits        This is a list of the screening recommended for you and due dates:  Health Maintenance  Topic Date Due   Zoster (Shingles) Vaccine (1 of 2) Never done   Eye exam for diabetics  07/23/2021   Complete foot exam   09/26/2021   COVID-19 Vaccine (5 - 2023-24 season) 10/31/2021   Hemoglobin A1C  05/22/2022   Yearly kidney health urinalysis for diabetes  11/22/2022   Yearly kidney function blood test for diabetes  04/30/2023   Medicare Annual Wellness Visit  05/26/2023   DTaP/Tdap/Td vaccine (2 - Td or Tdap) 01/31/2024   Pneumonia Vaccine  Completed   Flu Shot  Completed   DEXA scan (bone density measurement)  Completed   Hepatitis C Screening: USPSTF Recommendation to screen - Ages 45-79 yo.  Completed   HPV Vaccine  Aged Out   Colon Cancer Screening  Discontinued    Advanced directives: Yes  Conditions/risks identified: Yes  Next appointment: Follow up in one year for your annual wellness visit.   Preventive Care 76 Years and Older, Female Preventive care refers to lifestyle choices and visits with your health care provider that can promote health and wellness. What does preventive care include? A yearly physical exam. This is also called an annual well check. Dental exams once or twice a year. Routine eye exams. Ask your health care provider how often you should have your eyes checked. Personal lifestyle choices, including: Daily care of your teeth and gums. Regular physical activity. Eating a healthy diet. Avoiding tobacco and drug use. Limiting alcohol use. Practicing safe sex. Taking  low-dose aspirin every day. Taking vitamin and mineral supplements as recommended by your health care provider. What happens during an annual well check? The services and screenings done by your health care provider during your annual well check will depend on your age, overall health, lifestyle risk factors, and family history of disease. Counseling  Your health care provider may ask you questions about your: Alcohol use. Tobacco use. Drug use. Emotional well-being. Home and relationship well-being. Sexual activity. Eating habits. History of falls. Memory and ability to understand (cognition). Work and work Statistician. Reproductive health. Screening  You may have the following tests or measurements: Height, weight, and BMI. Blood pressure. Lipid and cholesterol levels. These may be checked every 5 years, or more frequently if you are over 47 years old. Skin check. Lung cancer screening. You may have this screening every year starting at age 46 if you have a 30-pack-year history of smoking and currently smoke or have quit within the past 15 years. Fecal occult blood test (FOBT) of the stool. You may have this test every year starting at age 45. Flexible sigmoidoscopy or colonoscopy. You may have a sigmoidoscopy every 5 years or a colonoscopy every 10 years starting at age 69. Hepatitis C blood test. Hepatitis B blood test. Sexually transmitted disease (STD) testing. Diabetes screening. This is done by checking your blood sugar (glucose) after you have not eaten for a while (fasting). You may have this done every 1-3 years. Bone  density scan. This is done to screen for osteoporosis. You may have this done starting at age 75. Mammogram. This may be done every 1-2 years. Talk to your health care provider about how often you should have regular mammograms. Talk with your health care provider about your test results, treatment options, and if necessary, the need for more tests. Vaccines   Your health care provider may recommend certain vaccines, such as: Influenza vaccine. This is recommended every year. Tetanus, diphtheria, and acellular pertussis (Tdap, Td) vaccine. You may need a Td booster every 10 years. Zoster vaccine. You may need this after age 73. Pneumococcal 13-valent conjugate (PCV13) vaccine. One dose is recommended after age 98. Pneumococcal polysaccharide (PPSV23) vaccine. One dose is recommended after age 26. Talk to your health care provider about which screenings and vaccines you need and how often you need them. This information is not intended to replace advice given to you by your health care provider. Make sure you discuss any questions you have with your health care provider. Document Released: 03/15/2015 Document Revised: 11/06/2015 Document Reviewed: 12/18/2014 Elsevier Interactive Patient Education  2017 [place] Prevention in the Home Falls can cause injuries. They can happen to people of all ages. There are many things you can do to make your home safe and to help prevent falls. What can I do on the outside of my home? Regularly fix the edges of walkways and driveways and fix any cracks. Remove anything that might make you trip as you walk through a door, such as a raised step or threshold. Trim any bushes or trees on the path to your home. Use bright outdoor lighting. Clear any walking paths of anything that might make someone trip, such as rocks or tools. Regularly check to see if handrails are loose or broken. Make sure that both sides of any steps have handrails. Any raised decks and porches should have guardrails on the edges. Have any leaves, snow, or ice cleared regularly. Use sand or salt on walking paths during winter. Clean up any spills in your garage right away. This includes oil or grease spills. What can I do in the bathroom? Use night lights. Install grab bars by the toilet and in the tub and shower. Do not use towel  bars as grab bars. Use non-skid mats or decals in the tub or shower. If you need to sit down in the shower, use a plastic, non-slip stool. Keep the floor dry. Clean up any water that spills on the floor as soon as it happens. Remove soap buildup in the tub or shower regularly. Attach bath mats securely with double-sided non-slip rug tape. Do not have throw rugs and other things on the floor that can make you trip. What can I do in the bedroom? Use night lights. Make sure that you have a light by your bed that is easy to reach. Do not use any sheets or blankets that are too big for your bed. They should not hang down onto the floor. Have a firm chair that has side arms. You can use this for support while you get dressed. Do not have throw rugs and other things on the floor that can make you trip. What can I do in the kitchen? Clean up any spills right away. Avoid walking on wet floors. Keep items that you use a lot in easy-to-reach places. If you need to reach something above you, use a strong step stool that has a grab bar. Keep  electrical cords out of the way. Do not use floor polish or wax that makes floors slippery. If you must use wax, use non-skid floor wax. Do not have throw rugs and other things on the floor that can make you trip. What can I do with my stairs? Do not leave any items on the stairs. Make sure that there are handrails on both sides of the stairs and use them. Fix handrails that are broken or loose. Make sure that handrails are as long as the stairways. Check any carpeting to make sure that it is firmly attached to the stairs. Fix any carpet that is loose or worn. Avoid having throw rugs at the top or bottom of the stairs. If you do have throw rugs, attach them to the floor with carpet tape. Make sure that you have a light switch at the top of the stairs and the bottom of the stairs. If you do not have them, ask someone to add them for you. What else can I do to help  prevent falls? Wear shoes that: Do not have high heels. Have rubber bottoms. Are comfortable and fit you well. Are closed at the toe. Do not wear sandals. If you use a stepladder: Make sure that it is fully opened. Do not climb a closed stepladder. Make sure that both sides of the stepladder are locked into place. Ask someone to hold it for you, if possible. Clearly mark and make sure that you can see: Any grab bars or handrails. First and last steps. Where the edge of each step is. Use tools that help you move around (mobility aids) if they are needed. These include: Canes. Walkers. Scooters. Crutches. Turn on the lights when you go into a dark area. Replace any light bulbs as soon as they burn out. Set up your furniture so you have a clear path. Avoid moving your furniture around. If any of your floors are uneven, fix them. If there are any pets around you, be aware of where they are. Review your medicines with your doctor. Some medicines can make you feel dizzy. This can increase your chance of falling. Ask your doctor what other things that you can do to help prevent falls. This information is not intended to replace advice given to you by your health care provider. Make sure you discuss any questions you have with your health care provider. Document Released: 12/13/2008 Document Revised: 07/25/2015 Document Reviewed: 03/23/2014 Elsevier Interactive Patient Education  2017 [employer].

## 2022-05-27 DIAGNOSIS — Z9889 Other specified postprocedural states: Secondary | ICD-10-CM | POA: Diagnosis not present

## 2022-05-27 DIAGNOSIS — K420 Umbilical hernia with obstruction, without gangrene: Secondary | ICD-10-CM | POA: Diagnosis not present

## 2022-05-27 DIAGNOSIS — Z9049 Acquired absence of other specified parts of digestive tract: Secondary | ICD-10-CM | POA: Diagnosis not present

## 2022-05-27 LAB — HEMOGLOBIN A1C: Hgb A1c MFr Bld: 6.2 % (ref 4.6–6.5)

## 2022-05-28 DIAGNOSIS — E1142 Type 2 diabetes mellitus with diabetic polyneuropathy: Secondary | ICD-10-CM | POA: Diagnosis not present

## 2022-05-28 DIAGNOSIS — I2510 Atherosclerotic heart disease of native coronary artery without angina pectoris: Secondary | ICD-10-CM | POA: Diagnosis not present

## 2022-05-28 DIAGNOSIS — G8929 Other chronic pain: Secondary | ICD-10-CM | POA: Diagnosis not present

## 2022-05-28 DIAGNOSIS — Z48815 Encounter for surgical aftercare following surgery on the digestive system: Secondary | ICD-10-CM | POA: Diagnosis not present

## 2022-05-28 DIAGNOSIS — I110 Hypertensive heart disease with heart failure: Secondary | ICD-10-CM | POA: Diagnosis not present

## 2022-05-28 DIAGNOSIS — I509 Heart failure, unspecified: Secondary | ICD-10-CM | POA: Diagnosis not present

## 2022-05-29 DIAGNOSIS — Z48815 Encounter for surgical aftercare following surgery on the digestive system: Secondary | ICD-10-CM | POA: Diagnosis not present

## 2022-05-29 DIAGNOSIS — I2510 Atherosclerotic heart disease of native coronary artery without angina pectoris: Secondary | ICD-10-CM | POA: Diagnosis not present

## 2022-05-29 DIAGNOSIS — I110 Hypertensive heart disease with heart failure: Secondary | ICD-10-CM | POA: Diagnosis not present

## 2022-05-29 DIAGNOSIS — G8929 Other chronic pain: Secondary | ICD-10-CM | POA: Diagnosis not present

## 2022-05-29 DIAGNOSIS — I509 Heart failure, unspecified: Secondary | ICD-10-CM | POA: Diagnosis not present

## 2022-05-29 DIAGNOSIS — E1142 Type 2 diabetes mellitus with diabetic polyneuropathy: Secondary | ICD-10-CM | POA: Diagnosis not present

## 2022-05-31 DIAGNOSIS — M47816 Spondylosis without myelopathy or radiculopathy, lumbar region: Secondary | ICD-10-CM | POA: Diagnosis not present

## 2022-05-31 DIAGNOSIS — E785 Hyperlipidemia, unspecified: Secondary | ICD-10-CM | POA: Diagnosis not present

## 2022-05-31 DIAGNOSIS — E1142 Type 2 diabetes mellitus with diabetic polyneuropathy: Secondary | ICD-10-CM | POA: Diagnosis not present

## 2022-05-31 DIAGNOSIS — M1990 Unspecified osteoarthritis, unspecified site: Secondary | ICD-10-CM | POA: Diagnosis not present

## 2022-05-31 DIAGNOSIS — K219 Gastro-esophageal reflux disease without esophagitis: Secondary | ICD-10-CM | POA: Diagnosis not present

## 2022-05-31 DIAGNOSIS — M791 Myalgia, unspecified site: Secondary | ICD-10-CM | POA: Diagnosis not present

## 2022-05-31 DIAGNOSIS — F32A Depression, unspecified: Secondary | ICD-10-CM | POA: Diagnosis not present

## 2022-05-31 DIAGNOSIS — Z48815 Encounter for surgical aftercare following surgery on the digestive system: Secondary | ICD-10-CM | POA: Diagnosis not present

## 2022-05-31 DIAGNOSIS — G8929 Other chronic pain: Secondary | ICD-10-CM | POA: Diagnosis not present

## 2022-05-31 DIAGNOSIS — K5790 Diverticulosis of intestine, part unspecified, without perforation or abscess without bleeding: Secondary | ICD-10-CM | POA: Diagnosis not present

## 2022-05-31 DIAGNOSIS — H9319 Tinnitus, unspecified ear: Secondary | ICD-10-CM | POA: Diagnosis not present

## 2022-05-31 DIAGNOSIS — G4733 Obstructive sleep apnea (adult) (pediatric): Secondary | ICD-10-CM | POA: Diagnosis not present

## 2022-05-31 DIAGNOSIS — I509 Heart failure, unspecified: Secondary | ICD-10-CM | POA: Diagnosis not present

## 2022-05-31 DIAGNOSIS — D509 Iron deficiency anemia, unspecified: Secondary | ICD-10-CM | POA: Diagnosis not present

## 2022-05-31 DIAGNOSIS — G43009 Migraine without aura, not intractable, without status migrainosus: Secondary | ICD-10-CM | POA: Diagnosis not present

## 2022-05-31 DIAGNOSIS — I110 Hypertensive heart disease with heart failure: Secondary | ICD-10-CM | POA: Diagnosis not present

## 2022-05-31 DIAGNOSIS — K7581 Nonalcoholic steatohepatitis (NASH): Secondary | ICD-10-CM | POA: Diagnosis not present

## 2022-05-31 DIAGNOSIS — F411 Generalized anxiety disorder: Secondary | ICD-10-CM | POA: Diagnosis not present

## 2022-05-31 DIAGNOSIS — R413 Other amnesia: Secondary | ICD-10-CM | POA: Diagnosis not present

## 2022-05-31 DIAGNOSIS — J45909 Unspecified asthma, uncomplicated: Secondary | ICD-10-CM | POA: Diagnosis not present

## 2022-05-31 DIAGNOSIS — I2510 Atherosclerotic heart disease of native coronary artery without angina pectoris: Secondary | ICD-10-CM | POA: Diagnosis not present

## 2022-05-31 DIAGNOSIS — D510 Vitamin B12 deficiency anemia due to intrinsic factor deficiency: Secondary | ICD-10-CM | POA: Diagnosis not present

## 2022-05-31 DIAGNOSIS — M5116 Intervertebral disc disorders with radiculopathy, lumbar region: Secondary | ICD-10-CM | POA: Diagnosis not present

## 2022-05-31 DIAGNOSIS — E1169 Type 2 diabetes mellitus with other specified complication: Secondary | ICD-10-CM | POA: Diagnosis not present

## 2022-06-01 DIAGNOSIS — G8929 Other chronic pain: Secondary | ICD-10-CM | POA: Diagnosis not present

## 2022-06-01 DIAGNOSIS — I110 Hypertensive heart disease with heart failure: Secondary | ICD-10-CM | POA: Diagnosis not present

## 2022-06-01 DIAGNOSIS — Z48815 Encounter for surgical aftercare following surgery on the digestive system: Secondary | ICD-10-CM | POA: Diagnosis not present

## 2022-06-01 DIAGNOSIS — E1142 Type 2 diabetes mellitus with diabetic polyneuropathy: Secondary | ICD-10-CM | POA: Diagnosis not present

## 2022-06-01 DIAGNOSIS — I2510 Atherosclerotic heart disease of native coronary artery without angina pectoris: Secondary | ICD-10-CM | POA: Diagnosis not present

## 2022-06-01 DIAGNOSIS — I509 Heart failure, unspecified: Secondary | ICD-10-CM | POA: Diagnosis not present

## 2022-06-02 DIAGNOSIS — Z48815 Encounter for surgical aftercare following surgery on the digestive system: Secondary | ICD-10-CM | POA: Diagnosis not present

## 2022-06-02 DIAGNOSIS — I110 Hypertensive heart disease with heart failure: Secondary | ICD-10-CM | POA: Diagnosis not present

## 2022-06-02 DIAGNOSIS — I2510 Atherosclerotic heart disease of native coronary artery without angina pectoris: Secondary | ICD-10-CM | POA: Diagnosis not present

## 2022-06-02 DIAGNOSIS — G8929 Other chronic pain: Secondary | ICD-10-CM | POA: Diagnosis not present

## 2022-06-02 DIAGNOSIS — E1142 Type 2 diabetes mellitus with diabetic polyneuropathy: Secondary | ICD-10-CM | POA: Diagnosis not present

## 2022-06-02 DIAGNOSIS — I509 Heart failure, unspecified: Secondary | ICD-10-CM | POA: Diagnosis not present

## 2022-06-03 DIAGNOSIS — I509 Heart failure, unspecified: Secondary | ICD-10-CM | POA: Diagnosis not present

## 2022-06-03 DIAGNOSIS — E1142 Type 2 diabetes mellitus with diabetic polyneuropathy: Secondary | ICD-10-CM | POA: Diagnosis not present

## 2022-06-03 DIAGNOSIS — Z48815 Encounter for surgical aftercare following surgery on the digestive system: Secondary | ICD-10-CM | POA: Diagnosis not present

## 2022-06-03 DIAGNOSIS — I2510 Atherosclerotic heart disease of native coronary artery without angina pectoris: Secondary | ICD-10-CM | POA: Diagnosis not present

## 2022-06-03 DIAGNOSIS — I110 Hypertensive heart disease with heart failure: Secondary | ICD-10-CM | POA: Diagnosis not present

## 2022-06-03 DIAGNOSIS — G8929 Other chronic pain: Secondary | ICD-10-CM | POA: Diagnosis not present

## 2022-06-04 ENCOUNTER — Ambulatory Visit (INDEPENDENT_AMBULATORY_CARE_PROVIDER_SITE_OTHER): Payer: Medicare Other | Admitting: Podiatry

## 2022-06-04 ENCOUNTER — Encounter: Payer: Self-pay | Admitting: Podiatry

## 2022-06-04 DIAGNOSIS — D2372 Other benign neoplasm of skin of left lower limb, including hip: Secondary | ICD-10-CM | POA: Diagnosis not present

## 2022-06-04 DIAGNOSIS — D2371 Other benign neoplasm of skin of right lower limb, including hip: Secondary | ICD-10-CM | POA: Diagnosis not present

## 2022-06-04 DIAGNOSIS — M79675 Pain in left toe(s): Secondary | ICD-10-CM | POA: Diagnosis not present

## 2022-06-04 DIAGNOSIS — D689 Coagulation defect, unspecified: Secondary | ICD-10-CM | POA: Diagnosis not present

## 2022-06-04 DIAGNOSIS — B351 Tinea unguium: Secondary | ICD-10-CM

## 2022-06-04 DIAGNOSIS — M79676 Pain in unspecified toe(s): Secondary | ICD-10-CM | POA: Diagnosis not present

## 2022-06-05 DIAGNOSIS — I110 Hypertensive heart disease with heart failure: Secondary | ICD-10-CM | POA: Diagnosis not present

## 2022-06-05 DIAGNOSIS — G8929 Other chronic pain: Secondary | ICD-10-CM | POA: Diagnosis not present

## 2022-06-05 DIAGNOSIS — I509 Heart failure, unspecified: Secondary | ICD-10-CM | POA: Diagnosis not present

## 2022-06-05 DIAGNOSIS — Z48815 Encounter for surgical aftercare following surgery on the digestive system: Secondary | ICD-10-CM | POA: Diagnosis not present

## 2022-06-05 DIAGNOSIS — E1142 Type 2 diabetes mellitus with diabetic polyneuropathy: Secondary | ICD-10-CM | POA: Diagnosis not present

## 2022-06-05 DIAGNOSIS — I2510 Atherosclerotic heart disease of native coronary artery without angina pectoris: Secondary | ICD-10-CM | POA: Diagnosis not present

## 2022-06-05 NOTE — Progress Notes (Signed)
She presents today chief complaint of painful elongated toenails and painful calluses.  Objective: Pulses are palpable.  No open lesions or wounds.  Toenails are long thick yellow dystrophic-like mycotic and painful upon debridement and palpation.  Multiple reactive hyperkeratotic lesions plantar foot and toes.  Assessment: Pain limb secondary to onychomycosis benign skin lesions.  Plan: Debridement of benign skin lesions debridement of toenails 1 through 5 bilateral.  Plan: Follow-up with her in about 3 months all nails and reactive hyperkeratotic lesions were debrided today no iatrogenic lesions noted.

## 2022-06-08 DIAGNOSIS — E1142 Type 2 diabetes mellitus with diabetic polyneuropathy: Secondary | ICD-10-CM | POA: Diagnosis not present

## 2022-06-08 DIAGNOSIS — I2510 Atherosclerotic heart disease of native coronary artery without angina pectoris: Secondary | ICD-10-CM | POA: Diagnosis not present

## 2022-06-08 DIAGNOSIS — I509 Heart failure, unspecified: Secondary | ICD-10-CM | POA: Diagnosis not present

## 2022-06-08 DIAGNOSIS — Z48815 Encounter for surgical aftercare following surgery on the digestive system: Secondary | ICD-10-CM | POA: Diagnosis not present

## 2022-06-08 DIAGNOSIS — G8929 Other chronic pain: Secondary | ICD-10-CM | POA: Diagnosis not present

## 2022-06-08 DIAGNOSIS — I110 Hypertensive heart disease with heart failure: Secondary | ICD-10-CM | POA: Diagnosis not present

## 2022-06-10 DIAGNOSIS — Z48815 Encounter for surgical aftercare following surgery on the digestive system: Secondary | ICD-10-CM | POA: Diagnosis not present

## 2022-06-10 DIAGNOSIS — G8929 Other chronic pain: Secondary | ICD-10-CM | POA: Diagnosis not present

## 2022-06-10 DIAGNOSIS — I509 Heart failure, unspecified: Secondary | ICD-10-CM | POA: Diagnosis not present

## 2022-06-10 DIAGNOSIS — I110 Hypertensive heart disease with heart failure: Secondary | ICD-10-CM | POA: Diagnosis not present

## 2022-06-10 DIAGNOSIS — I2510 Atherosclerotic heart disease of native coronary artery without angina pectoris: Secondary | ICD-10-CM | POA: Diagnosis not present

## 2022-06-10 DIAGNOSIS — E1142 Type 2 diabetes mellitus with diabetic polyneuropathy: Secondary | ICD-10-CM | POA: Diagnosis not present

## 2022-06-11 DIAGNOSIS — I110 Hypertensive heart disease with heart failure: Secondary | ICD-10-CM | POA: Diagnosis not present

## 2022-06-11 DIAGNOSIS — E1142 Type 2 diabetes mellitus with diabetic polyneuropathy: Secondary | ICD-10-CM | POA: Diagnosis not present

## 2022-06-11 DIAGNOSIS — I2510 Atherosclerotic heart disease of native coronary artery without angina pectoris: Secondary | ICD-10-CM | POA: Diagnosis not present

## 2022-06-11 DIAGNOSIS — Z48815 Encounter for surgical aftercare following surgery on the digestive system: Secondary | ICD-10-CM | POA: Diagnosis not present

## 2022-06-11 DIAGNOSIS — G8929 Other chronic pain: Secondary | ICD-10-CM | POA: Diagnosis not present

## 2022-06-11 DIAGNOSIS — I509 Heart failure, unspecified: Secondary | ICD-10-CM | POA: Diagnosis not present

## 2022-06-15 DIAGNOSIS — E1142 Type 2 diabetes mellitus with diabetic polyneuropathy: Secondary | ICD-10-CM | POA: Diagnosis not present

## 2022-06-15 DIAGNOSIS — Z48815 Encounter for surgical aftercare following surgery on the digestive system: Secondary | ICD-10-CM | POA: Diagnosis not present

## 2022-06-15 DIAGNOSIS — G8929 Other chronic pain: Secondary | ICD-10-CM | POA: Diagnosis not present

## 2022-06-15 DIAGNOSIS — I2510 Atherosclerotic heart disease of native coronary artery without angina pectoris: Secondary | ICD-10-CM | POA: Diagnosis not present

## 2022-06-15 DIAGNOSIS — I110 Hypertensive heart disease with heart failure: Secondary | ICD-10-CM | POA: Diagnosis not present

## 2022-06-15 DIAGNOSIS — I509 Heart failure, unspecified: Secondary | ICD-10-CM | POA: Diagnosis not present

## 2022-06-15 NOTE — Progress Notes (Unsigned)
[name] [address] [place] [address] Phone: [phone] Subjective:   INadine Counts, am serving as a scribe for Dr. Antoine Primas.  I'm seeing this patient by the request  of:  Pincus Sanes, MD  CC: Bilateral knee pain, low back pain  [id number]  04/14/2022 Patient has had lumbar radiculopathy Concerned that she is having some worsening spinal stenosis evidence of more nerve impingement.  Did respond initially to an epidural in June 2022.  Discussed with patient about the possibility of repeating a necessary.  Patient is the primary caregiver for her ailing husband and the social determinants of health as he was going to give her more transportation.  We will continue to monitor.  Encouraged her to continue to work on weight loss.  Has done well but has not been working out as frequently.  Will continue to monitor.  Patient's BMI was as high as 44 and now down to 36.  Total time reviewing patient's chart as well as discussing with patient 31 minutes      Update 06/16/2022 DELLIE PIASECKI is a 77 y.o. female coming in with complaint of B knee pain and lumbar radiculopathy. Patient states knees were doing well up until 2 days ago. Wearing brace for the last 2 days on left knee. Back is about the same. Standing too long still causes discomfort. Right foot toe that had surgery has been hurting lately. Knows its arthritis.     Past Medical History:  Diagnosis Date   Abdominal hernia    Abnormal Pap smear    ALLERGIC RHINITIS 10/22/2006   Allergy    ANEMIA 12/18/2008   Arthritis    scoliosis moderate deg changes lumbar Xray 11/04/17    Asthma    ASYMPTOMATIC POSTMENOPAUSAL STATUS 11/22/2007   Back pain    CAD (coronary artery disease)    in LAD   CHF (congestive heart failure)    Complication of anesthesia 2010   pt states she woke up during colonoscopy and was aware of "losing control of breathing" during mammoplasty   Degenerative  arthritis    Diverticulosis    Dyslipidemia    Fibroid    Frequent headaches    GERD 07/20/2007   GOITER, MULTINODULAR 07/20/2007   Headache(784.0) 07/20/2007   HEARING LOSS 11/22/2007   Hiatal hernia    Hiatal hernia    large   History of chicken pox    Hx of colposcopy with cervical biopsy    HYPERCHOLESTEROLEMIA 01/13/2007   Hyperglycemia    HYPERTENSION 10/22/2006   Lung nodule    unchanged since 05/26/13    Migraines    NASH (nonalcoholic steatohepatitis)    Nocturnal hypoxemia 02/17/2013   Obesity    Obstructive sleep apnea    OSA on CPAP    per neurology   OSTEOARTHRITIS 10/22/2006   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   Pre-diabetes    Sedimentation rate elevation    Sleep apnea    on cpap   Urine incontinence    UTI (urinary tract infection)    Past Surgical History:  Procedure Laterality Date   BREAST SURGERY  1984   Breast reduction b/l    CATARACT EXTRACTION, BILATERAL     DEXA  08/2005   DILATION AND CURETTAGE OF UTERUS     ELECTROCARDIOGRAM  10/15/2006   ESOPHAGOGASTRODUODENOSCOPY  12/08/2005   FOOT SURGERY     hammertoe and bunion 05/2017    Stress Cardiolite  10/21/2005   sweat gland removal     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   Procedure: Left TOTAL KNEE ARTHROPLASTY;  Surgeon: Kathryne Hitch, MD;  Location: [facility] OR;  Service: Orthopedics;  Laterality: Left;   UMBILICAL HERNIA REPAIR N/A 04/25/2022   Procedure: OPEN INCARCERATED HERNIA REPAIR UMBILICAL WITH APPENDECTOMY;  Surgeon: Stechschulte, Hyman Hopes, MD;  Location: [facility] OR;  Service: General;  Laterality: N/A;   WISDOM TOOTH EXTRACTION     Social History   Socioeconomic History   Marital status: Married    Spouse name: Darcel Bayley   Number of children: 2   Years of education: College   Highest education level: Not on file  Occupational History   Occupation: Retired  Tobacco Use   Smoking status: Former    Packs/day: 2.00    Years: 20.00    Additional pack years:  0.00    Total pack years: 40.00    Types: Cigarettes    Quit date: 03/03/1979    Years since quitting: 43.3   Smokeless tobacco: Never  Vaping Use   Vaping Use: Never used  Substance and Sexual Activity   Alcohol use: Yes    Comment: rare - TWICE A YR   Drug use: No   Sexual activity: Yes    Birth control/protection: Surgical  Other Topics Concern   Not on file  Social History Narrative   Patient is married Darcel Bayley).   Right Handed   Drinks 1 cup caffeine   Social Determinants of Health   Financial Resource Strain: Low Risk  (05/26/2022)   Overall Financial Resource Strain (CARDIA)    Difficulty of Paying Living Expenses: Not hard at all  Food Insecurity: No Food Insecurity (05/26/2022)   Hunger Vital Sign    Worried About Running Out of Food in the Last Year: Never true    Ran Out of Food in the Last Year: Never true  Transportation Needs: No Transportation Needs (05/26/2022)   PRAPARE - Administrator, Civil Service (Medical): No    Lack of Transportation (Non-Medical): No  Physical Activity: Inactive (05/26/2022)   Exercise Vital Sign    Days of Exercise per Week: 0 days    Minutes of Exercise per Session: 0 min  Stress: Patient Declined (05/26/2022)   [employer] of Occupational Health - Occupational Stress Questionnaire    Feeling of Stress : Patient declined  Social Connections: Unknown (05/26/2022)   Social Connection and Isolation Panel [NHANES]    Frequency of Communication with Friends and Family: More than three times a week    Frequency of Social Gatherings with Friends and Family: Once a week    Attends Religious Services: Not on Marketing executive or Organizations: Yes    Attends Banker Meetings: More than 4 times per year    Marital Status: Married   Allergies  Allergen Reactions   Aspirin Hives   Coconut (Cocos Nucifera) Hives   Lisinopril Cough   Metoprolol Itching   Other     Latex paint, the smell causes  hives. Not allergic to latex gloves   General anesthesia - has had issues in past where anesthesia was not properly administered and caused side effects   Peanut-Containing Drug Products Hives, Itching and Other (See Comments)   Prednisone     Unknown reaction    Strawberry Extract Hives   Penicillins Rash   Family History  Problem Relation Age of Onset   Asthma Mother  Depression Mother    Bipolar disorder Mother    Dementia Mother    Arthritis Mother    Breast cancer Mother        primary   Colon cancer Mother        mets from breast   Cancer Mother        colon   Hypertension Father    Migraines Father    Cancer Maternal Aunt        ?   Heart disease Maternal Grandmother    Cancer Maternal Grandfather        stomach ?   Sleep apnea Neg Hx    Esophageal cancer Neg Hx    Stomach cancer Neg Hx    Rectal cancer Neg Hx      Current Outpatient Medications (Cardiovascular):    ezetimibe (ZETIA) 10 MG tablet, Take 1 tablet (10 mg total) by mouth daily.   furosemide (LASIX) 40 MG tablet, Take 1 tablet (40 mg total) by mouth daily.   rosuvastatin (CRESTOR) 40 MG tablet, Take 1 tablet (40 mg total) by mouth daily. At night  Current Outpatient Medications (Respiratory):    cetirizine (ZYRTEC) 10 MG tablet, Take 10 mg by mouth daily.  Current Outpatient Medications (Analgesics):    acetaminophen (TYLENOL) 500 MG tablet, Take 1,000 mg by mouth 3 (three) times daily as needed for moderate pain.  Current Outpatient Medications (Hematological):    clopidogrel (PLAVIX) 75 MG tablet, TAKE 1 TABLET DAILY (Patient taking differently: Take 75 mg by mouth daily.)   Cyanocobalamin 1000 MCG CAPS, Take 1 capsule by mouth daily.   Ferrous Sulfate (IRON) 325 (65 FE) MG TABS, Take 1 tablet by mouth daily.  Current Outpatient Medications (Other):    Boswellia-Glucosamine-Vit D (OSTEO BI-FLEX ONE PER DAY PO), Take 1 tablet by mouth daily.   Calcium Carb-Cholecalciferol (CHEWABLE  CALCIUM/D3 PO), Take 1 tablet by mouth daily.   Coenzyme Q10 (CO Q 10 PO), Take 400 mg by mouth.   gabapentin (NEURONTIN) 100 MG capsule, TAKE 3 CAPSULES BY MOUTH AT BEDTIME   Multiple Vitamin (MULTIVITAMIN) tablet, Take 1 tablet by mouth daily.   Omega-3 Fatty Acids (FISH OIL PO), Take 2 g by mouth daily.   pantoprazole (PROTONIX) 20 MG tablet, Take 1 tablet (20 mg total) by mouth 2 (two) times daily. 30 min before food   polyethylene glycol (MIRALAX / GLYCOLAX) 17 g packet, Take 17 g by mouth every other day.   potassium chloride SA (KLOR-CON M20) 20 MEQ tablet, Take 1 tablet (20 mEq total) by mouth daily.   pyridoxine (B-6) 200 MG tablet, Take 200 mg by mouth daily.   trospium (SANCTURA) 20 MG tablet, Take 1 tablet (20 mg total) by mouth 2 (two) times daily. Prn overactive bladder (Patient not taking: Reported on 05/26/2022)   TURMERIC PO, Take 538 mg by mouth daily.   Vitamin D, Ergocalciferol, (DRISDOL) 1.25 MG (50000 UNIT) CAPS capsule, Take 1 capsule (50,000 Units total) by mouth every 7 (seven) days. (Patient taking differently: Take 50,000 Units by mouth every 7 (seven) days. mondays)   vitamin E 200 UNIT capsule, Take 200 Units by mouth daily.   Reviewed prior external information including notes and imaging from  primary care provider As well as notes that were available from care everywhere and other healthcare systems.  Patient has been seen by podiatry recently for onychomycosis as well as since we have seen patient did have an umbilical hernia repair.  Also had an appendectomy.  This  goes back to patient's admission in February 2024.  Past medical history, social, surgical and family history all reviewed in electronic medical record.  No pertanent information unless stated regarding to the chief complaint.   Review of Systems:  No headache, visual changes, nausea, vomiting, diarrhea, constipation, dizziness, abdominal pain, skin rash, fevers, chills, night sweats, weight loss,  swollen lymph nodes, body aches chest pain, shortness of breath, mood changes. POSITIVE muscle aches, joint swelling  Objective  Blood pressure (!) 102/58, pulse 75, height  (1.626 m), weight 208 lb (94.3 kg), SpO2 95 %.   General: No apparent distress alert and oriented x3 mood and affect normal, dressed appropriately.  HEENT: Pupils equal, extraocular movements intact  Respiratory: Patient's speak in full sentences and does not appear short of breath  Antalgic gait using the aid of a cane to help her with walking.  Does have trace lower extremity edema noted.  Patient's left knee does have the replacement aspect noted.  Does have an audible popping sensation with any type of movement and may be some mild laxity noted with valgus stress.    Impression and Recommendations:     The above documentation has been reviewed and is accurate and complete Judi Saa, DO

## 2022-06-16 ENCOUNTER — Ambulatory Visit (INDEPENDENT_AMBULATORY_CARE_PROVIDER_SITE_OTHER): Payer: Medicare Other | Admitting: Family Medicine

## 2022-06-16 ENCOUNTER — Ambulatory Visit (INDEPENDENT_AMBULATORY_CARE_PROVIDER_SITE_OTHER): Payer: Medicare Other

## 2022-06-16 VITALS — BP 102/58 | HR 75 | Ht 64.0 in | Wt 208.0 lb

## 2022-06-16 DIAGNOSIS — M170 Bilateral primary osteoarthritis of knee: Secondary | ICD-10-CM

## 2022-06-16 DIAGNOSIS — M25561 Pain in right knee: Secondary | ICD-10-CM

## 2022-06-16 DIAGNOSIS — K439 Ventral hernia without obstruction or gangrene: Secondary | ICD-10-CM | POA: Diagnosis not present

## 2022-06-16 DIAGNOSIS — M25562 Pain in left knee: Secondary | ICD-10-CM

## 2022-06-16 DIAGNOSIS — M19279 Secondary osteoarthritis, unspecified ankle and foot: Secondary | ICD-10-CM | POA: Diagnosis not present

## 2022-06-16 DIAGNOSIS — M62838 Other muscle spasm: Secondary | ICD-10-CM | POA: Diagnosis not present

## 2022-06-16 DIAGNOSIS — G8929 Other chronic pain: Secondary | ICD-10-CM

## 2022-06-16 DIAGNOSIS — M1990 Unspecified osteoarthritis, unspecified site: Secondary | ICD-10-CM | POA: Diagnosis not present

## 2022-06-16 NOTE — Assessment & Plan Note (Signed)
Discussed with patient I do think that a carbon fiber plate would be beneficial.

## 2022-06-16 NOTE — Assessment & Plan Note (Signed)
Status post replacement on the left side but will get x-rays secondary to the audible clicking that is noted.  Does have arthritic changes noted on the contralateral side that we will continue to monitor but not having any pain.

## 2022-06-16 NOTE — Patient Instructions (Addendum)
Xray today Aquatic Therapy when healed will be good first step See you again in 2 months Carbon fiber plate for shoes

## 2022-06-16 NOTE — Assessment & Plan Note (Signed)
Still healing at this time.  We discussed with patient that once she is healed to start potentially aquatic therapy.

## 2022-06-16 NOTE — Assessment & Plan Note (Signed)
Patient has been sick for some time and is doing better at the moment but I do feel that we do need to increase patient's strength and balance and will refer her to aquatic therapy when patient is completely healed from her skin mildly open wounds.  Follow-up with me again otherwise in 8 weeks

## 2022-06-18 DIAGNOSIS — I509 Heart failure, unspecified: Secondary | ICD-10-CM | POA: Diagnosis not present

## 2022-06-18 DIAGNOSIS — Z48815 Encounter for surgical aftercare following surgery on the digestive system: Secondary | ICD-10-CM | POA: Diagnosis not present

## 2022-06-18 DIAGNOSIS — I2510 Atherosclerotic heart disease of native coronary artery without angina pectoris: Secondary | ICD-10-CM | POA: Diagnosis not present

## 2022-06-18 DIAGNOSIS — I110 Hypertensive heart disease with heart failure: Secondary | ICD-10-CM | POA: Diagnosis not present

## 2022-06-18 DIAGNOSIS — E1142 Type 2 diabetes mellitus with diabetic polyneuropathy: Secondary | ICD-10-CM | POA: Diagnosis not present

## 2022-06-18 DIAGNOSIS — G8929 Other chronic pain: Secondary | ICD-10-CM | POA: Diagnosis not present

## 2022-06-19 DIAGNOSIS — K420 Umbilical hernia with obstruction, without gangrene: Secondary | ICD-10-CM | POA: Diagnosis not present

## 2022-06-19 DIAGNOSIS — Z9889 Other specified postprocedural states: Secondary | ICD-10-CM | POA: Diagnosis not present

## 2022-06-22 DIAGNOSIS — Z48815 Encounter for surgical aftercare following surgery on the digestive system: Secondary | ICD-10-CM | POA: Diagnosis not present

## 2022-06-22 DIAGNOSIS — G8929 Other chronic pain: Secondary | ICD-10-CM | POA: Diagnosis not present

## 2022-06-22 DIAGNOSIS — I110 Hypertensive heart disease with heart failure: Secondary | ICD-10-CM | POA: Diagnosis not present

## 2022-06-22 DIAGNOSIS — I509 Heart failure, unspecified: Secondary | ICD-10-CM | POA: Diagnosis not present

## 2022-06-22 DIAGNOSIS — E1142 Type 2 diabetes mellitus with diabetic polyneuropathy: Secondary | ICD-10-CM | POA: Diagnosis not present

## 2022-06-22 DIAGNOSIS — I2510 Atherosclerotic heart disease of native coronary artery without angina pectoris: Secondary | ICD-10-CM | POA: Diagnosis not present

## 2022-06-25 DIAGNOSIS — Z48815 Encounter for surgical aftercare following surgery on the digestive system: Secondary | ICD-10-CM | POA: Diagnosis not present

## 2022-06-25 DIAGNOSIS — G8929 Other chronic pain: Secondary | ICD-10-CM | POA: Diagnosis not present

## 2022-06-25 DIAGNOSIS — I2510 Atherosclerotic heart disease of native coronary artery without angina pectoris: Secondary | ICD-10-CM | POA: Diagnosis not present

## 2022-06-25 DIAGNOSIS — E1142 Type 2 diabetes mellitus with diabetic polyneuropathy: Secondary | ICD-10-CM | POA: Diagnosis not present

## 2022-06-25 DIAGNOSIS — I110 Hypertensive heart disease with heart failure: Secondary | ICD-10-CM | POA: Diagnosis not present

## 2022-06-25 DIAGNOSIS — I509 Heart failure, unspecified: Secondary | ICD-10-CM | POA: Diagnosis not present

## 2022-06-30 DIAGNOSIS — Z7902 Long term (current) use of antithrombotics/antiplatelets: Secondary | ICD-10-CM | POA: Diagnosis not present

## 2022-06-30 DIAGNOSIS — K5790 Diverticulosis of intestine, part unspecified, without perforation or abscess without bleeding: Secondary | ICD-10-CM | POA: Diagnosis not present

## 2022-06-30 DIAGNOSIS — M5116 Intervertebral disc disorders with radiculopathy, lumbar region: Secondary | ICD-10-CM | POA: Diagnosis not present

## 2022-06-30 DIAGNOSIS — M1990 Unspecified osteoarthritis, unspecified site: Secondary | ICD-10-CM | POA: Diagnosis not present

## 2022-06-30 DIAGNOSIS — J45909 Unspecified asthma, uncomplicated: Secondary | ICD-10-CM | POA: Diagnosis not present

## 2022-06-30 DIAGNOSIS — I110 Hypertensive heart disease with heart failure: Secondary | ICD-10-CM | POA: Diagnosis not present

## 2022-06-30 DIAGNOSIS — I2510 Atherosclerotic heart disease of native coronary artery without angina pectoris: Secondary | ICD-10-CM | POA: Diagnosis not present

## 2022-06-30 DIAGNOSIS — K219 Gastro-esophageal reflux disease without esophagitis: Secondary | ICD-10-CM | POA: Diagnosis not present

## 2022-06-30 DIAGNOSIS — Z96652 Presence of left artificial knee joint: Secondary | ICD-10-CM | POA: Diagnosis not present

## 2022-06-30 DIAGNOSIS — E1169 Type 2 diabetes mellitus with other specified complication: Secondary | ICD-10-CM | POA: Diagnosis not present

## 2022-06-30 DIAGNOSIS — Z48815 Encounter for surgical aftercare following surgery on the digestive system: Secondary | ICD-10-CM | POA: Diagnosis not present

## 2022-06-30 DIAGNOSIS — G43009 Migraine without aura, not intractable, without status migrainosus: Secondary | ICD-10-CM | POA: Diagnosis not present

## 2022-06-30 DIAGNOSIS — F32A Depression, unspecified: Secondary | ICD-10-CM | POA: Diagnosis not present

## 2022-06-30 DIAGNOSIS — G8929 Other chronic pain: Secondary | ICD-10-CM | POA: Diagnosis not present

## 2022-06-30 DIAGNOSIS — F411 Generalized anxiety disorder: Secondary | ICD-10-CM | POA: Diagnosis not present

## 2022-06-30 DIAGNOSIS — E785 Hyperlipidemia, unspecified: Secondary | ICD-10-CM | POA: Diagnosis not present

## 2022-06-30 DIAGNOSIS — E559 Vitamin D deficiency, unspecified: Secondary | ICD-10-CM | POA: Diagnosis not present

## 2022-06-30 DIAGNOSIS — Z6835 Body mass index (BMI) 35.0-35.9, adult: Secondary | ICD-10-CM | POA: Diagnosis not present

## 2022-06-30 DIAGNOSIS — I509 Heart failure, unspecified: Secondary | ICD-10-CM | POA: Diagnosis not present

## 2022-06-30 DIAGNOSIS — E1142 Type 2 diabetes mellitus with diabetic polyneuropathy: Secondary | ICD-10-CM | POA: Diagnosis not present

## 2022-06-30 DIAGNOSIS — M47816 Spondylosis without myelopathy or radiculopathy, lumbar region: Secondary | ICD-10-CM | POA: Diagnosis not present

## 2022-06-30 DIAGNOSIS — G4733 Obstructive sleep apnea (adult) (pediatric): Secondary | ICD-10-CM | POA: Diagnosis not present

## 2022-06-30 DIAGNOSIS — D509 Iron deficiency anemia, unspecified: Secondary | ICD-10-CM | POA: Diagnosis not present

## 2022-06-30 DIAGNOSIS — M791 Myalgia, unspecified site: Secondary | ICD-10-CM | POA: Diagnosis not present

## 2022-07-01 DIAGNOSIS — Z48815 Encounter for surgical aftercare following surgery on the digestive system: Secondary | ICD-10-CM | POA: Diagnosis not present

## 2022-07-01 DIAGNOSIS — G8929 Other chronic pain: Secondary | ICD-10-CM | POA: Diagnosis not present

## 2022-07-01 DIAGNOSIS — I110 Hypertensive heart disease with heart failure: Secondary | ICD-10-CM | POA: Diagnosis not present

## 2022-07-01 DIAGNOSIS — I2510 Atherosclerotic heart disease of native coronary artery without angina pectoris: Secondary | ICD-10-CM | POA: Diagnosis not present

## 2022-07-01 DIAGNOSIS — I509 Heart failure, unspecified: Secondary | ICD-10-CM | POA: Diagnosis not present

## 2022-07-01 DIAGNOSIS — E1142 Type 2 diabetes mellitus with diabetic polyneuropathy: Secondary | ICD-10-CM | POA: Diagnosis not present

## 2022-07-08 DIAGNOSIS — E1142 Type 2 diabetes mellitus with diabetic polyneuropathy: Secondary | ICD-10-CM | POA: Diagnosis not present

## 2022-07-08 DIAGNOSIS — G8929 Other chronic pain: Secondary | ICD-10-CM | POA: Diagnosis not present

## 2022-07-08 DIAGNOSIS — I509 Heart failure, unspecified: Secondary | ICD-10-CM | POA: Diagnosis not present

## 2022-07-08 DIAGNOSIS — I110 Hypertensive heart disease with heart failure: Secondary | ICD-10-CM | POA: Diagnosis not present

## 2022-07-08 DIAGNOSIS — I2510 Atherosclerotic heart disease of native coronary artery without angina pectoris: Secondary | ICD-10-CM | POA: Diagnosis not present

## 2022-07-08 DIAGNOSIS — Z48815 Encounter for surgical aftercare following surgery on the digestive system: Secondary | ICD-10-CM | POA: Diagnosis not present

## 2022-07-09 ENCOUNTER — Encounter: Payer: Self-pay | Admitting: Orthopaedic Surgery

## 2022-07-09 NOTE — Telephone Encounter (Signed)
Please advise 

## 2022-07-10 ENCOUNTER — Encounter: Payer: Self-pay | Admitting: Physician Assistant

## 2022-07-10 ENCOUNTER — Ambulatory Visit (INDEPENDENT_AMBULATORY_CARE_PROVIDER_SITE_OTHER): Payer: Medicare Other | Admitting: Physician Assistant

## 2022-07-10 ENCOUNTER — Other Ambulatory Visit (INDEPENDENT_AMBULATORY_CARE_PROVIDER_SITE_OTHER): Payer: Medicare Other

## 2022-07-10 ENCOUNTER — Telehealth: Payer: Self-pay | Admitting: Orthopaedic Surgery

## 2022-07-10 DIAGNOSIS — Z96652 Presence of left artificial knee joint: Secondary | ICD-10-CM | POA: Diagnosis not present

## 2022-07-10 MED ORDER — TRAMADOL HCL 50 MG PO TABS: 50.0000 mg | ORAL_TABLET | Freq: Two times a day (BID) | ORAL | 2 refills | Status: DC | PRN

## 2022-07-10 NOTE — Telephone Encounter (Signed)
Lvm for pt to cb to discuss  

## 2022-07-10 NOTE — Telephone Encounter (Signed)
Heather [name] requesting pt work in she is Post op 03/11/2021 Left TKA Patient states her knee is locking up and she has been unable to walk.

## 2022-07-10 NOTE — Telephone Encounter (Signed)
Pt returned call and worked in

## 2022-07-10 NOTE — Progress Notes (Signed)
Post-Op Visit Note   Patient: Heather [name]           Date of Birth: 12-Feb-1946           MRN: [id number] Visit Date: 07/10/2022 PCP: Pincus Sanes, MD   Assessment & Plan:  Chief Complaint:  Chief Complaint  Patient presents with   Left Knee - Pain   Visit Diagnoses:  1. H/O total knee replacement, left     Plan: Patient is a pleasant 77 year old female who comes in today approximately 1 year and 4 months status post left total knee replacement by Dr. Magnus Ivan, date of surgery 03/11/2021.  She was doing well until yesterday.  She notes that she was in a store where it was rather cold and when she walked out her left knee locked up.  At that point, it was extended too much for her to be able to get into her car.  She notes after about 45 minutes that she was able to bend her knee enough to get into her car.  She took Aleve last night which did seem to help a little.  Today, she is having intermittent walking.  When her knee is not locking, however it is not back to full extension or flexion.  She notes her symptoms appear to worsen when she is twisting her knee to get in and out of the car.  Of note, she is on Plavix.  Examination of the left knee reveals extension to approximately 100 degrees.  Flexion to approximately 95 degrees.  She has medial joint line tenderness.  No tenderness around the patella.  She is stable to valgus and varus stress and without pain.  She is neurovascularly intact distally.  She really does not have any significant effusion.  At this point, it is hard to tell what is going, but I am thinking she may have pinched on a piece of scar tissue.  This may cause increased swelling due to the fact she is on Plavix which may limit her flexion.  I have recommended relative rest and ice over the weekend.  I will have her follow-up with Dr. Magnus Ivan or Delane Ginger next week.  I sent in tramadol to take as needed.  Call with concerns or questions.  Follow-Up Instructions: Return  for f/u with Dr. Magnus Ivan.   Orders:  Orders Placed This Encounter  Procedures   XR Knee 1-2 Views Left   No orders of the defined types were placed in this encounter.   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 06/16/2022   Hypotension 05/26/2022   BMI 34.0-34.9,adult 05/06/2022   Obesity, Beginning BMI 44.60 05/06/2022   SBO (small bowel obstruction) (HCC) 04/25/2022   Obstructive sleep apnea treated with BiPAP 04/25/2022   Dependence on nocturnal oxygen therapy 04/25/2022   Hyponatremia 04/25/2022   Generalized abdominal pain 04/23/2022   BMI 36.0-36.9,adult 04/08/2022   Persistent cough for 3 weeks or longer 02/13/2022   Traumatic tear of supraspinatus tendon of right shoulder 11/24/2021   Right arm pain 11/18/2021   Positive self-administered antigen test for COVID-19 11/09/2021   Stress 10/16/2021   Status post total left knee replacement 03/11/2021   Chronic pain 01/06/2021   Lumbar radiculopathy 07/04/2020   Bilateral sciatica 07/04/2020   Pain in both hands 07/04/2020  Hyperlipidemia associated with type 2 diabetes mellitus (HCC) 05/01/2020   Abnormal blood level of copper 03/14/2020   Leukopenia 03/14/2020   Obesity (BMI 30-39.9) 11/13/2019   Chronic pain of right thumb 10/11/2019   Vitamin D deficiency 07/27/2019   Memory loss 04/27/2019   Mass of left wrist 01/18/2019   Ventral hernia without obstruction or gangrene 01/18/2019   Mass of right thigh 09/13/2018   Depression 08/02/2018   Sleeps in sitting position due to orthopnea 07/13/2018   BiPAP (biphasic positive airway pressure) dependence 05/24/2018   Goiter 04/15/2017   Urinary incontinence 04/15/2017   Type 2 diabetes mellitus without complication, without long-term current use of  insulin (HCC) 02/15/2017   Bilateral leg cramps 01/05/2017   Peripheral neuropathy 05/27/2016   Elevated sed rate 03/05/2016   Muscle spasm of both lower legs 02/12/2016   Leg edema 02/12/2016   Iron deficiency anemia 02/12/2016   Hypokalemia 02/12/2016   Pernicious anemia 02/12/2016   Degenerative disc disease, lumbar 04/09/2015   Degenerative arthritis of knee, bilateral 03/13/2015   Chronic low back pain 03/13/2015   CAD S/P percutaneous coronary angioplasty 10/31/2013   Incidental lung nodule, > 3mm and < 8mm, Left lower lobe 7mm - June 2014, April 2015 unchanged 06/25/2013   OSA on CPAP 06/21/2013   Myalgia and myositis 05/02/2013   Muscle ache 04/03/2013   Class 3 severe obesity with serious comorbidity and body mass index (BMI) of 40.0 to 44.9 in adult (HCC) 01/24/2013   Large hiatal hernia 08/24/2012    LLL nodule 6mm - June 2014, described in 2003 07/13/2012   Asthma, chronic 06/29/2012   Migraine without aura 03/11/2012   SOB (shortness of breath) 12/28/2011   Hx of fibroids 08/03/2011   Knee pain, left 12/25/2010   NECK PAIN 12/03/2009   TINNITUS 11/22/2007   HEARING LOSS 11/22/2007   Chronic pain of both knees 11/22/2007   Gastroesophageal reflux disease without esophagitis 07/20/2007   NASH (nonalcoholic steatohepatitis) 07/20/2007   ANXIETY 10/22/2006   Allergic rhinitis 10/22/2006   Osteoarthritis 10/22/2006   Past Medical History:  Diagnosis Date   Abdominal hernia    Abnormal Pap smear    ALLERGIC RHINITIS 10/22/2006   Allergy    ANEMIA 12/18/2008   Arthritis    scoliosis moderate deg changes lumbar Xray 11/04/17    Asthma    ASYMPTOMATIC POSTMENOPAUSAL STATUS 11/22/2007   Back pain    CAD (coronary artery disease)    in LAD   CHF (congestive heart failure) (HCC)    Complication of anesthesia 2010   pt states she woke up during colonoscopy and was aware of "losing control of breathing" during mammoplasty   Degenerative arthritis    Diverticulosis     Dyslipidemia    Fibroid    Frequent headaches    GERD 07/20/2007   GOITER, MULTINODULAR 07/20/2007   Headache(784.0) 07/20/2007   HEARING LOSS 11/22/2007   Hiatal hernia    Hiatal hernia    large   History of chicken pox    Hx of colposcopy with cervical biopsy    HYPERCHOLESTEROLEMIA 01/13/2007   Hyperglycemia    HYPERTENSION 10/22/2006   Lung nodule    unchanged since 05/26/13    Migraines    NASH (nonalcoholic steatohepatitis)    Nocturnal hypoxemia 02/17/2013   Obesity    Obstructive sleep apnea    OSA on CPAP    per neurology   OSTEOARTHRITIS 10/22/2006   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   Pre-diabetes  Sedimentation rate elevation    Sleep apnea    on cpap   Urine incontinence    UTI (urinary tract infection)     Family History  Problem Relation Age of Onset   Asthma Mother    Depression Mother    Bipolar disorder Mother    Dementia Mother    Arthritis Mother    Breast cancer Mother        primary   Colon cancer Mother        mets from breast   Cancer Mother        colon   Hypertension Father    Migraines Father    Cancer Maternal Aunt        ?   Heart disease Maternal Grandmother    Cancer Maternal Grandfather        stomach ?   Sleep apnea Neg Hx    Esophageal cancer Neg Hx    Stomach cancer Neg Hx    Rectal cancer Neg Hx     Past Surgical History:  Procedure Laterality Date   BREAST SURGERY  1984   Breast reduction b/l    CATARACT EXTRACTION, BILATERAL     DEXA  08/2005   DILATION AND CURETTAGE OF UTERUS     ELECTROCARDIOGRAM  10/15/2006   ESOPHAGOGASTRODUODENOSCOPY  12/08/2005   FOOT SURGERY     hammertoe and bunion 05/2017    Stress Cardiolite  10/21/2005   sweat gland removal     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   Procedure: Left TOTAL KNEE ARTHROPLASTY;  Surgeon: Kathryne Hitch, MD;  Location: [facility] OR;  Service: Orthopedics;  Laterality: Left;   UMBILICAL HERNIA REPAIR N/A 04/25/2022   Procedure:  OPEN INCARCERATED HERNIA REPAIR UMBILICAL WITH APPENDECTOMY;  Surgeon: Quentin Ore, MD;  Location: [facility] OR;  Service: General;  Laterality: N/A;   WISDOM TOOTH EXTRACTION     Social History   Occupational History   Occupation: Retired  Tobacco Use   Smoking status: Former    Packs/day: 2.00    Years: 20.00    Additional pack years: 0.00    Total pack years: 40.00    Types: Cigarettes    Quit date: 03/03/1979    Years since quitting: 43.3   Smokeless tobacco: Never  Vaping Use   Vaping Use: Never used  Substance and Sexual Activity   Alcohol use: Yes    Comment: rare - TWICE A YR   Drug use: No   Sexual activity: Yes    Birth control/protection: Surgical

## 2022-07-15 ENCOUNTER — Ambulatory Visit (INDEPENDENT_AMBULATORY_CARE_PROVIDER_SITE_OTHER): Payer: Medicare Other | Admitting: Orthopaedic Surgery

## 2022-07-15 ENCOUNTER — Encounter: Payer: Self-pay | Admitting: Orthopaedic Surgery

## 2022-07-15 DIAGNOSIS — Z96652 Presence of left artificial knee joint: Secondary | ICD-10-CM | POA: Diagnosis not present

## 2022-07-15 NOTE — Progress Notes (Signed)
The patient is well-known to me.  We replaced her left knee in January of last year.  She has done well with that knee until a week ago when she was in the grocery store and then walking outside felt like the knee locked up on her.  She said it took about 45 minutes for the knee to "release" and she has had no issues since then.  She did see Tessa Lerner last week in the office and x-rays were obtained and showed no acute findings with the replacement itself.  I did look at those the day that she came in and today and there was no complicating features of the replacement.  My exam the knee feels ligamentously stable and she has good range of motion of the knee.  Her left knee extensor mechanism is also intact.  There is concern that there could be some instability of the knee itself.  It is a cruciate retaining medial congruent knee and if she does have this recur, we would need to assess this further in terms of whether or not she would need upsizing of her polyliner.  She has not had any of the symptoms before a week ago and has not had any since then.  I did show her knee model and explained what feel may be going on.  If she does have recurrence she knows to let us know immediately and call our office to be seen that day even if it is with one of my partners or physician extenders.  All question concerns were answered and addressed.

## 2022-07-22 DIAGNOSIS — K420 Umbilical hernia with obstruction, without gangrene: Secondary | ICD-10-CM | POA: Diagnosis not present

## 2022-07-22 DIAGNOSIS — Z6841 Body Mass Index (BMI) 40.0 and over, adult: Secondary | ICD-10-CM | POA: Diagnosis not present

## 2022-07-23 ENCOUNTER — Encounter: Payer: Self-pay | Admitting: Internal Medicine

## 2022-07-24 MED ORDER — FUROSEMIDE 20 MG PO TABS: 20.0000 mg | ORAL_TABLET | Freq: Two times a day (BID) | ORAL | 1 refills | Status: DC

## 2022-08-04 ENCOUNTER — Ambulatory Visit (INDEPENDENT_AMBULATORY_CARE_PROVIDER_SITE_OTHER): Payer: Medicare Other | Admitting: Family Medicine

## 2022-08-04 ENCOUNTER — Encounter (INDEPENDENT_AMBULATORY_CARE_PROVIDER_SITE_OTHER): Payer: Self-pay | Admitting: Family Medicine

## 2022-08-04 VITALS — BP 121/76 | HR 68 | Temp 97.9°F | Ht 64.0 in | Wt 214.0 lb

## 2022-08-04 DIAGNOSIS — Z6836 Body mass index (BMI) 36.0-36.9, adult: Secondary | ICD-10-CM | POA: Diagnosis not present

## 2022-08-04 DIAGNOSIS — E559 Vitamin D deficiency, unspecified: Secondary | ICD-10-CM

## 2022-08-04 DIAGNOSIS — N3941 Urge incontinence: Secondary | ICD-10-CM

## 2022-08-04 DIAGNOSIS — F3289 Other specified depressive episodes: Secondary | ICD-10-CM | POA: Diagnosis not present

## 2022-08-04 DIAGNOSIS — E669 Obesity, unspecified: Secondary | ICD-10-CM

## 2022-08-04 MED ORDER — VITAMIN D (ERGOCALCIFEROL) 1.25 MG (50000 UNIT) PO CAPS: 50000.0000 [IU] | ORAL_CAPSULE | ORAL | 0 refills | Status: DC

## 2022-08-04 NOTE — Progress Notes (Unsigned)
Chief Complaint:   OBESITY Heather [name] is here to discuss her progress with her obesity treatment plan along with follow-up of her obesity related diagnoses. Heather [name] is on following a lower carbohydrate, vegetable and lean protein rich diet plan and states she is following her eating plan approximately 0% of the time. Heather [name] states she has just started back with doing cardio.   Today's visit was #: 84 Starting weight: 268 lbs Starting date: 12/23/2016 Today's weight: 214 lbs Today's date: 08/04/2022 Total lbs lost to date: 54 Total lbs lost since last in-office visit: 0  Interim History: Patient's last visit was approximately 2 months ago.  She has not been concentrating on her weight loss and she has gained 15 pounds during this time.  She has been through a lot of health issues, and she is ready to get back on track.  Subjective:   1. Urge incontinence Patient notes no improvement with [place].  She sometimes does not feel the urge.  2. Vitamin D deficiency Patient is on vitamin D, and her level is stable.  She requests a refill today.  She denies nausea, vomiting, or muscle weakness.  3. Other depression with emotional eating Patient's stress has increased and she has done more carbohydrate rich eating.  Assessment/Plan:   1. Urge incontinence Patient was referred to Urology for evaluation.  2. Vitamin D deficiency Patient will continue prescription vitamin D, and we will refill for 90 days.  - Vitamin D, Ergocalciferol, (DRISDOL) 1.25 MG (50000 UNIT) CAPS capsule; Take 1 capsule (50,000 Units total) by mouth every 7 (seven) days.  Dispense: 13 capsule; Refill: 0  3. Other depression with emotional eating Patient was offered medications, and she declined.  We will continue to monitor.  4. BMI 36.0-36.9,adult  5. Obesity, Beginning BMI 44.60 Heather [name] is currently in the action stage of change. As such, her goal is to get back to weightloss efforts . She has agreed to the  Category 2 Plan or following a lower carbohydrate, vegetable and lean protein rich diet plan.   Exercise goals: As is.   Behavioral modification strategies: increasing lean protein intake.  Heather [name] has agreed to follow-up with our clinic in 3 weeks. She was informed of the importance of frequent follow-up visits to maximize her success with intensive lifestyle modifications for her multiple health conditions.   Objective:   Blood pressure 121/76, pulse 68, temperature 97.9 F (36.6 C), height 5\' 4"  (1.626 m), weight 214 lb (97.1 kg), SpO2 99 %. Body mass index is 36.73 kg/m.  Lab Results  Component Value Date   CREATININE 0.72 05/26/2022   BUN 10 05/26/2022   NA 140 05/26/2022   K 3.6 05/26/2022   CL 104 05/26/2022   CO2 30 05/26/2022   Lab Results  Component Value Date   ALT 16 05/26/2022   AST 21 05/26/2022   ALKPHOS 64 05/26/2022   BILITOT 0.3 05/26/2022   Lab Results  Component Value Date   HGBA1C 6.2 05/26/2022   HGBA1C 6.1 11/21/2021   HGBA1C 5.3 04/14/2021   HGBA1C 5.8 (H) 03/07/2021   HGBA1C 6.1 (H) 11/25/2020   Lab Results  Component Value Date   INSULIN 6.0 11/25/2020   INSULIN 4.3 10/09/2019   INSULIN 8.6 07/05/2019   INSULIN 6.4 12/05/2018   INSULIN 3.6 04/27/2018   Lab Results  Component Value Date   TSH 0.62 05/26/2022   Lab Results  Component Value Date   CHOL 145 05/26/2022   HDL 62.50 05/26/2022  LDLCALC 60 05/26/2022   TRIG 111.0 05/26/2022   CHOLHDL 2 05/26/2022   Lab Results  Component Value Date   VD25OH 48.25 11/21/2021   VD25OH 55.3 04/14/2021   VD25OH 66.2 11/25/2020   Lab Results  Component Value Date   WBC 4.8 05/26/2022   HGB 12.0 05/26/2022   HCT 36.5 05/26/2022   MCV 95.2 05/26/2022   PLT 246.0 05/26/2022   Lab Results  Component Value Date   IRON 49 05/26/2022   TIBC 211.4 (L) 05/26/2022   FERRITIN 288.0 05/26/2022   Attestation Statements:   Reviewed by clinician on day of visit: allergies, medications,  problem list, medical history, surgical history, family history, social history, and previous encounter notes.   I, Burt Knack, am acting as transcriptionist for Quillian Quince, MD.  I have reviewed the above documentation for accuracy and completeness, and I agree with the above. -  Quillian Quince, MD

## 2022-08-11 NOTE — Therapy (Signed)
OUTPATIENT PHYSICAL THERAPY LOWER EXTREMITY EVALUATION   Patient Name: Heather [name] MRN: [id number] DOB:Dec 31, 1945, 77 y.o., female Today's Date: 08/13/2022  END OF SESSION:  PT End of Session - 08/12/22 1203     Visit Number 1    Number of Visits 17    Date for PT Re-Evaluation 10/16/22    Authorization Type MEDICARE PART A AND B    PT Start Time 1152    PT Stop Time 1238    PT Time Calculation (min) 46 min    Activity Tolerance Patient tolerated treatment well    Behavior During Therapy [facility] for tasks assessed/performed             Past Medical History:  Diagnosis Date   Abdominal hernia    Abnormal Pap smear    ALLERGIC RHINITIS 10/22/2006   Allergy    ANEMIA 12/18/2008   Arthritis    scoliosis moderate deg changes lumbar Xray 11/04/17    Asthma    ASYMPTOMATIC POSTMENOPAUSAL STATUS 11/22/2007   Back pain    CAD (coronary artery disease)    in LAD   CHF (congestive heart failure) (HCC)    Complication of anesthesia 2010   pt states she woke up during colonoscopy and was aware of "losing control of breathing" during mammoplasty   Degenerative arthritis    Diverticulosis    Dyslipidemia    Fibroid    Frequent headaches    GERD 07/20/2007   GOITER, MULTINODULAR 07/20/2007   Headache(784.0) 07/20/2007   HEARING LOSS 11/22/2007   Hiatal hernia    Hiatal hernia    large   History of chicken pox    Hx of colposcopy with cervical biopsy    HYPERCHOLESTEROLEMIA 01/13/2007   Hyperglycemia    HYPERTENSION 10/22/2006   Lung nodule    unchanged since 05/26/13    Migraines    NASH (nonalcoholic steatohepatitis)    Nocturnal hypoxemia 02/17/2013   Obesity    Obstructive sleep apnea    OSA on CPAP    per neurology   OSTEOARTHRITIS 10/22/2006   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   Pre-diabetes    Sedimentation rate elevation    Sleep apnea    on cpap   Urine incontinence    UTI (urinary tract infection)    Past Surgical History:   Procedure Laterality Date   BREAST SURGERY  1984   Breast reduction b/l    CATARACT EXTRACTION, BILATERAL     DEXA  08/2005   DILATION AND CURETTAGE OF UTERUS     ELECTROCARDIOGRAM  10/15/2006   ESOPHAGOGASTRODUODENOSCOPY  12/08/2005   FOOT SURGERY     hammertoe and bunion 05/2017    Stress Cardiolite  10/21/2005   sweat gland removal     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   Procedure: Left TOTAL KNEE ARTHROPLASTY;  Surgeon: Kathryne Hitch, MD;  Location: [facility] OR;  Service: Orthopedics;  Laterality: Left;   UMBILICAL HERNIA REPAIR N/A 04/25/2022   Procedure: OPEN INCARCERATED HERNIA REPAIR UMBILICAL WITH APPENDECTOMY;  Surgeon: Quentin Ore, MD;  Location: [facility] OR;  Service: General;  Laterality: N/A;   WISDOM TOOTH EXTRACTION     Patient Active Problem List   Diagnosis Date Noted   Urge incontinence 08/04/2022   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 06/16/2022   Hypotension 05/26/2022   BMI 34.0-34.9,adult 05/06/2022   Obesity, Beginning BMI 44.60 05/06/2022   SBO (small bowel obstruction) (HCC) 04/25/2022   Obstructive sleep apnea  treated with BiPAP 04/25/2022   Dependence on nocturnal oxygen therapy 04/25/2022   Hyponatremia 04/25/2022   Generalized abdominal pain 04/23/2022   BMI 36.0-36.9,adult 04/08/2022   Persistent cough for 3 weeks or longer 02/13/2022   Traumatic tear of supraspinatus tendon of right shoulder 11/24/2021   Right arm pain 11/18/2021   Positive self-administered antigen test for COVID-19 11/09/2021   Stress 10/16/2021   Status post total left knee replacement 03/11/2021   Chronic pain 01/06/2021   Lumbar radiculopathy 07/04/2020   Bilateral sciatica 07/04/2020   Pain in both hands 07/04/2020   Hyperlipidemia associated with type 2 diabetes mellitus (HCC) 05/01/2020   Abnormal blood level of copper 03/14/2020   Leukopenia 03/14/2020   Obesity (BMI 30-39.9) 11/13/2019   Chronic pain of  right thumb 10/11/2019   Vitamin D deficiency 07/27/2019   Memory loss 04/27/2019   Mass of left wrist 01/18/2019   Ventral hernia without obstruction or gangrene 01/18/2019   Mass of right thigh 09/13/2018   Depression 08/02/2018   Sleeps in sitting position due to orthopnea 07/13/2018   BiPAP (biphasic positive airway pressure) dependence 05/24/2018   Goiter 04/15/2017   Urinary incontinence 04/15/2017   Type 2 diabetes mellitus without complication, without long-term current use of insulin (HCC) 02/15/2017   Bilateral leg cramps 01/05/2017   Peripheral neuropathy 05/27/2016   Elevated sed rate 03/05/2016   Muscle spasm of both lower legs 02/12/2016   Leg edema 02/12/2016   Iron deficiency anemia 02/12/2016   Hypokalemia 02/12/2016   Pernicious anemia 02/12/2016   Degenerative disc disease, lumbar 04/09/2015   Degenerative arthritis of knee, bilateral 03/13/2015   Chronic low back pain 03/13/2015   CAD S/P percutaneous coronary angioplasty 10/31/2013   Incidental lung nodule, > 3mm and < 8mm, Left lower lobe 7mm - June 2014, April 2015 unchanged 06/25/2013   OSA on CPAP 06/21/2013   Myalgia and myositis 05/02/2013   Muscle ache 04/03/2013   Class 3 severe obesity with serious comorbidity and body mass index (BMI) of 40.0 to 44.9 in adult (HCC) 01/24/2013   Large hiatal hernia 08/24/2012    LLL nodule 6mm - June 2014, described in 2003 07/13/2012   Asthma, chronic 06/29/2012   Migraine without aura 03/11/2012   SOB (shortness of breath) 12/28/2011   Hx of fibroids 08/03/2011   Knee pain, left 12/25/2010   NECK PAIN 12/03/2009   TINNITUS 11/22/2007   HEARING LOSS 11/22/2007   Chronic pain of both knees 11/22/2007   Gastroesophageal reflux disease without esophagitis 07/20/2007   NASH (nonalcoholic steatohepatitis) 07/20/2007   ANXIETY 10/22/2006   Allergic rhinitis 10/22/2006   Osteoarthritis 10/22/2006    PCP: Pincus Sanes, MD  REFERRING PROVIDER: Judi Saa,  DO   REFERRING DIAG: M25.561,M25.562,G89.29 (ICD-10-CM) - Chronic pain of both knees   THERAPY DIAG:  Chronic pain of left knee  Chronic pain of right knee  Muscle weakness (generalized)  Difficulty in walking, not elsewhere classified  Rationale for Evaluation and Treatment: Rehabilitation  ONSET DATE: 5 months  SUBJECTIVE:   SUBJECTIVE STATEMENT: Pt reports having difficulty with both of her knees for the past 5 months. She notes her knees feel tight, more tight than painful. Pt expresses desire to try aquatic therapy to see if it is helpful for her knees and her ability to walk. Pt states because of the tightness of her knees and weakness of her legs, she limits the time on her feet. Pt endorses the use of a SPC/walker as needed  for community mobility. Pt notes she has fluid retention of her legs which contributes to the stiffness of her knees. Pt states she has arthritis all over hr body.  PERTINENT HISTORY: High BMI, CHF, CAD, L TKR, degenerative arthritis  PAIN:  Are you having pain? Yes: NPRS scale: 3/10 Pain location: Both knees Pain description: Tightness/achiness Aggravating factors: Cold, extended feet, after periods of rest Relieving factors: Occasional use of pain medication, seated elliptical Normal Range: 3-6/10  PRECAUTIONS: None  WEIGHT BEARING RESTRICTIONS: No  FALLS:  Has patient fallen in last 6 months? 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, reads, church-sings in choir  PLOF: Independent  PATIENT GOALS: Pt be Ind c better quality of movement  NEXT MD VISIT: Next week  OBJECTIVE:   DIAGNOSTIC FINDINGS:  DG L knee 07/10/22 L knee Narrative: Well-seated prosthesis without  complication   PATIENT SURVEYS:  FOTO: Perceived function   47%, predicted   56%   COGNITION: Overall cognitive status: Within functional limits for tasks assessed     SENSATION: WFL  EDEMA:  Swelling of both legs c pitting edema of ankle at sock line  MUSCLE LENGTH: Hamstrings: Right 40 deg; Left 40 deg Thomas test: Right Tight deg; Left tight deg  POSTURE: rounded shoulders, forward head, and increased thoracic kyphosis  PALPATION: Not overly TTP of the knees  LOWER EXTREMITY ROM:  Active ROM Right eval Left eval  Hip flexion    Hip extension    Hip abduction    Hip adduction    Hip internal rotation    Hip external rotation    Knee flexion 95 100  Knee extension 10 0  Ankle dorsiflexion    Ankle plantarflexion    Ankle inversion    Ankle eversion     (Blank rows = not tested)  LOWER EXTREMITY MMT:  MMT Right eval Left eval  Hip flexion 4- 4-  Hip extension 3 3  Hip abduction 4- 4-  Hip adduction    Hip internal rotation    Hip external rotation 4 4  Knee flexion 4+ 4+  Knee extension 5 5  Ankle dorsiflexion    Ankle plantarflexion    Ankle inversion    Ankle eversion     (Blank rows = not tested)  LOWER EXTREMITY SPECIAL TESTS:    For the R knee: Knee special tests: Anterior drawer test: negative, Posterior drawer test: negative, and McMurray's test: negative  FUNCTIONAL TESTS:  5 times sit to stand: 23.9 2 minute walk test: 370'  Single leg stance: 1" GAIT: Distance walked: 54' Assistive device utilized: None Level of assistance: Complete Independence Comments: Slower pace   TODAY'S TREATMENT:  [facility] Adult PT Treatment:                                                DATE: 08/12/22 Therapeutic Exercise: Developed, instructed in, and pt completed therex as noted in HEP   PATIENT EDUCATION:  Education details:  Eval findings, POC, HEP, self care  Person educated: Patient Education method: Explanation, Demonstration, Tactile cues, Verbal cues, and Handouts Education comprehension: verbalized understanding, returned demonstration, verbal cues required, and tactile cues required  HOME EXERCISE PROGRAM: Access Code: [email or web address] URL: [email or web address] Date: 08/12/2022 Prepared by: Joellyn Rued  Exercises - Active Straight Leg Raise with Quad Set  - 1 x daily - 7 x weekly - 2 sets - 10 reps - 3 hold - Hooklying Clamshell with Resistance  - 1 x daily - 7 x weekly - 2 sets - 10 reps - 3 hold - Sidelying Hip Abduction  - 1 x daily - 7 x weekly - 2 sets - 10 reps - 3 hold - Sit to Stand with Counter Support  - 1 x daily - 7 x weekly - 2 sets - 10 reps - 3 hold  ASSESSMENT:  CLINICAL IMPRESSION: Patient is a 77 y.o. female who was seen today for physical therapy evaluation and treatment for M25.561,M25.562,G89.29 (ICD-10-CM) - Chronic pain of both knees. Pt presents to PT with tightness/achiness of both knees and weakness of both legs which in impacting her balance and functional mobility. Fluid retention of both legs may be contributing to tightness if the knees, in addition to OA of the R knee and a TKR of the L knee. Pt will benefit from skilled PT to address impairments to optimize function with less tightness/achiness.   OBJECTIVE IMPAIRMENTS: decreased activity tolerance, decreased balance, difficulty walking, decreased ROM, decreased strength, obesity, and pain.   ACTIVITY LIMITATIONS: carrying, lifting, bending, sitting, standing, squatting, stairs, and locomotion level  PARTICIPATION LIMITATIONS: meal prep, cleaning, laundry, community activity, and church  PERSONAL FACTORS: High BMI, CHF, CAD, L TKR, degenerative arthritis are also affecting patient's functional outcome.   REHAB POTENTIAL: Good  CLINICAL DECISION MAKING: Evolving/moderate complexity  EVALUATION COMPLEXITY:  Moderate   GOALS:  SHORT TERM GOALS: Target date: 09/04/22 Pt will be Ind in an initial HEP Baseline: started Goal status: INITIAL  LONG TERM GOALS: Target date: 10/16/22  Pt will be Ind in a final HEP to maintain achieved LOF  Baseline: started Goal status: INITIAL  2.  Increase single standing for each LE to greater than 5 sec for improved functional mobility Baseline: 1" each LE Goal status: INITIAL  3.  Increase bilat knee and hip strength by at least 1/2 muscle strength grade for improved functional mobility  Baseline: see flow sheets Goal status: INITIAL  4.  Improve 5xSTS by MCID of 5" and by MCID of 17ft as indication of improved functional mobility  Baseline: 5xSTS c use of hands from bari-chair 23.9". 370" Goal status: INITIAL  5.  Pt's FOTO score will improved to the predicted value of 56% as indication of improved function  Baseline: 47% Goal status: INITIAL PLAN:  PT FREQUENCY: 2x/week  PT DURATION: 8 weeks  PLANNED INTERVENTIONS: Therapeutic exercises, Therapeutic activity, Neuromuscular re-education, Balance training, Gait training, Patient/Family education, Self Care, Joint mobilization, Aquatic Therapy, Dry Needling, Electrical stimulation, Cryotherapy, Moist heat, Taping, Vasopneumatic device, Ultrasound, Ionotophoresis 4mg /ml Dexamethasone, Manual  therapy, and Re-evaluation  PLAN FOR NEXT SESSION: Review FOTO; assess response to HEP; progress therex as indicated; use of modalities, manual therapy; and TPDN as indicated.  Lathan Gieselman MS, PT 08/13/22 6:11 AM

## 2022-08-12 ENCOUNTER — Other Ambulatory Visit: Payer: Self-pay

## 2022-08-12 ENCOUNTER — Ambulatory Visit: Payer: Medicare Other | Attending: Internal Medicine

## 2022-08-12 DIAGNOSIS — M6281 Muscle weakness (generalized): Secondary | ICD-10-CM | POA: Insufficient documentation

## 2022-08-12 DIAGNOSIS — M25562 Pain in left knee: Secondary | ICD-10-CM | POA: Diagnosis not present

## 2022-08-12 DIAGNOSIS — G8929 Other chronic pain: Secondary | ICD-10-CM | POA: Diagnosis not present

## 2022-08-12 DIAGNOSIS — R262 Difficulty in walking, not elsewhere classified: Secondary | ICD-10-CM | POA: Insufficient documentation

## 2022-08-12 DIAGNOSIS — M25561 Pain in right knee: Secondary | ICD-10-CM | POA: Diagnosis not present

## 2022-08-14 NOTE — Progress Notes (Unsigned)
[name] [address] [place] [address] Phone: [phone] Subjective:   Heather [name], am serving as a scribe for Dr. Antoine Primas.  I'm seeing this patient by the request  of:  Pincus Sanes, MD  CC: Right knee pain follow-up  [id number]  06/16/2022 Discussed with patient I do think that a carbon fiber plate would be beneficial.   Status post replacement on the left side but will get x-rays secondary to the audible clicking that is noted.  Does have arthritic changes noted on the contralateral side that we will continue to monitor but not having any pain.     Patient has been sick for some time and is doing better at the moment but I do feel that we do need to increase patient's strength and balance and will refer her to aquatic therapy when patient is completely healed from her skin mildly open wounds. Follow-up with me again otherwise in 8 weeks   Update 08/17/2022 Heather [name] is a 77 y.o. female coming in with complaint of R knee pain. Patient states that she is having more bad days than good days but her pain is not as intense as it has been. Notes swelling in B legs which she feels contributes to her pain. When she woke up today she noted a cramping in the knee. Has been to one session of physical therapy.       Past Medical History:  Diagnosis Date   Abdominal hernia    Abnormal Pap smear    ALLERGIC RHINITIS 10/22/2006   Allergy    ANEMIA 12/18/2008   Arthritis    scoliosis moderate deg changes lumbar Xray 11/04/17    Asthma    ASYMPTOMATIC POSTMENOPAUSAL STATUS 11/22/2007   Back pain    CAD (coronary artery disease)    in LAD   CHF (congestive heart failure) (HCC)    Complication of anesthesia 2010   pt states she woke up during colonoscopy and was aware of "losing control of breathing" during mammoplasty   Degenerative arthritis    Diverticulosis    Dyslipidemia    Fibroid    Frequent headaches    GERD  07/20/2007   GOITER, MULTINODULAR 07/20/2007   Headache(784.0) 07/20/2007   HEARING LOSS 11/22/2007   Hiatal hernia    Hiatal hernia    large   History of chicken pox    Hx of colposcopy with cervical biopsy    HYPERCHOLESTEROLEMIA 01/13/2007   Hyperglycemia    HYPERTENSION 10/22/2006   Lung nodule    unchanged since 05/26/13    Migraines    NASH (nonalcoholic steatohepatitis)    Nocturnal hypoxemia 02/17/2013   Obesity    Obstructive sleep apnea    OSA on CPAP    per neurology   OSTEOARTHRITIS 10/22/2006   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   Pre-diabetes    Sedimentation rate elevation    Sleep apnea    on cpap   Urine incontinence    UTI (urinary tract infection)    Past Surgical History:  Procedure Laterality Date   BREAST SURGERY  1984   Breast reduction b/l    CATARACT EXTRACTION, BILATERAL     DEXA  08/2005   DILATION AND CURETTAGE OF UTERUS     ELECTROCARDIOGRAM  10/15/2006   ESOPHAGOGASTRODUODENOSCOPY  12/08/2005   FOOT SURGERY     hammertoe and bunion 05/2017    Stress Cardiolite  10/21/2005   sweat gland removal  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   Procedure: Left TOTAL KNEE ARTHROPLASTY;  Surgeon: Kathryne Hitch, MD;  Location: [facility] OR;  Service: Orthopedics;  Laterality: Left;   UMBILICAL HERNIA REPAIR N/A 04/25/2022   Procedure: OPEN INCARCERATED HERNIA REPAIR UMBILICAL WITH APPENDECTOMY;  Surgeon: Stechschulte, Hyman Hopes, MD;  Location: [facility] OR;  Service: General;  Laterality: N/A;   WISDOM TOOTH EXTRACTION     Social History   Socioeconomic History   Marital status: Married    Spouse name: Darcel Bayley   Number of children: 2   Years of education: College   Highest education level: Not on file  Occupational History   Occupation: Retired  Tobacco Use   Smoking status: Former    Packs/day: 2.00    Years: 20.00    Additional pack years: 0.00    Total pack years: 40.00    Types: Cigarettes    Quit date: 03/03/1979    Years  since quitting: 43.4   Smokeless tobacco: Never  Vaping Use   Vaping Use: Never used  Substance and Sexual Activity   Alcohol use: Yes    Comment: rare - TWICE A YR   Drug use: No   Sexual activity: Yes    Birth control/protection: Surgical  Other Topics Concern   Not on file  Social History Narrative   Patient is married Darcel Bayley).   Right Handed   Drinks 1 cup caffeine   Social Determinants of Health   Financial Resource Strain: Low Risk  (05/26/2022)   Overall Financial Resource Strain (CARDIA)    Difficulty of Paying Living Expenses: Not hard at all  Food Insecurity: No Food Insecurity (05/26/2022)   Hunger Vital Sign    Worried About Running Out of Food in the Last Year: Never true    Ran Out of Food in the Last Year: Never true  Transportation Needs: No Transportation Needs (05/26/2022)   PRAPARE - Administrator, Civil Service (Medical): No    Lack of Transportation (Non-Medical): No  Physical Activity: Inactive (05/26/2022)   Exercise Vital Sign    Days of Exercise per Week: 0 days    Minutes of Exercise per Session: 0 min  Stress: Patient Declined (05/26/2022)   [employer] of Occupational Health - Occupational Stress Questionnaire    Feeling of Stress : Patient declined  Social Connections: Unknown (05/26/2022)   Social Connection and Isolation Panel [NHANES]    Frequency of Communication with Friends and Family: More than three times a week    Frequency of Social Gatherings with Friends and Family: Once a week    Attends Religious Services: Not on Marketing executive or Organizations: Yes    Attends Banker Meetings: More than 4 times per year    Marital Status: Married   Allergies  Allergen Reactions   Aspirin Hives   Coconut (Cocos Nucifera) Hives   Lisinopril Cough   Metoprolol Itching   Other     Latex paint, the smell causes hives. Not allergic to latex gloves   General anesthesia - has had issues in past where  anesthesia was not properly administered and caused side effects   Peanut-Containing Drug Products Hives, Itching and Other (See Comments)   Prednisone     Unknown reaction    Strawberry Extract Hives   Penicillins Rash   Family History  Problem Relation Age of Onset   Asthma Mother    Depression Mother    Bipolar disorder Mother  Dementia Mother    Arthritis Mother    Breast cancer Mother        primary   Colon cancer Mother        mets from breast   Cancer Mother        colon   Hypertension Father    Migraines Father    Cancer Maternal Aunt        ?   Heart disease Maternal Grandmother    Cancer Maternal Grandfather        stomach ?   Sleep apnea Neg Hx    Esophageal cancer Neg Hx    Stomach cancer Neg Hx    Rectal cancer Neg Hx      Current Outpatient Medications (Cardiovascular):    ezetimibe (ZETIA) 10 MG tablet, Take 1 tablet (10 mg total) by mouth daily.   furosemide (LASIX) 20 MG tablet, Take 1 tablet (20 mg total) by mouth 2 (two) times daily.   rosuvastatin (CRESTOR) 40 MG tablet, Take 1 tablet (40 mg total) by mouth daily. At night  Current Outpatient Medications (Respiratory):    cetirizine (ZYRTEC) 10 MG tablet, Take 10 mg by mouth daily.  Current Outpatient Medications (Analgesics):    acetaminophen (TYLENOL) 500 MG tablet, Take 1,000 mg by mouth 3 (three) times daily as needed for moderate pain.   traMADol (ULTRAM) 50 MG tablet, Take 1 tablet (50 mg total) by mouth 2 (two) times daily as needed.  Current Outpatient Medications (Hematological):    clopidogrel (PLAVIX) 75 MG tablet, TAKE 1 TABLET DAILY (Patient taking differently: Take 75 mg by mouth daily.)   Cyanocobalamin 1000 MCG CAPS, Take 1 capsule by mouth daily.   Ferrous Sulfate (IRON) 325 (65 FE) MG TABS, Take 1 tablet by mouth daily.  Current Outpatient Medications (Other):    Boswellia-Glucosamine-Vit D (OSTEO BI-FLEX ONE PER DAY PO), Take 1 tablet by mouth daily.   Calcium  Carb-Cholecalciferol (CHEWABLE CALCIUM/D3 PO), Take 1 tablet by mouth daily.   Coenzyme Q10 (CO Q 10 PO), Take 400 mg by mouth.   gabapentin (NEURONTIN) 100 MG capsule, TAKE 3 CAPSULES BY MOUTH AT BEDTIME   Multiple Vitamin (MULTIVITAMIN) tablet, Take 1 tablet by mouth daily.   Omega-3 Fatty Acids (FISH OIL PO), Take 2 g by mouth daily.   pantoprazole (PROTONIX) 20 MG tablet, Take 1 tablet (20 mg total) by mouth 2 (two) times daily. 30 min before food   polyethylene glycol (MIRALAX / GLYCOLAX) 17 g packet, Take 17 g by mouth every other day.   potassium chloride SA (KLOR-CON M20) 20 MEQ tablet, Take 1 tablet (20 mEq total) by mouth daily.   pyridoxine (B-6) 200 MG tablet, Take 200 mg by mouth daily.   trospium (SANCTURA) 20 MG tablet, Take 1 tablet (20 mg total) by mouth 2 (two) times daily. Prn overactive bladder   TURMERIC PO, Take 538 mg by mouth daily.   Vitamin D, Ergocalciferol, (DRISDOL) 1.25 MG (50000 UNIT) CAPS capsule, Take 1 capsule (50,000 Units total) by mouth every 7 (seven) days.   vitamin E 200 UNIT capsule, Take 200 Units by mouth daily.   Reviewed prior external information including notes and imaging from  primary care provider As well as notes that were available from care everywhere and other healthcare systems.  Past medical history, social, surgical and family history all reviewed in electronic medical record.  No pertanent information unless stated regarding to the chief complaint.   Review of Systems:  No headache, visual changes, nausea,  vomiting, diarrhea, constipation, dizziness, abdominal pain, skin rash, fevers, chills, night sweats, weight loss, swollen lymph nodes, body aches, chest pain, shortness of breath, mood changes. POSITIVE muscle aches, joint swelling  Objective  Blood pressure 108/64, pulse 76, height 5\' 4"  (1.626 m), weight 211 lb (95.7 kg), SpO2 97 %.   General: No apparent distress alert and oriented x3 mood and affect normal, dressed  appropriately.  HEENT: Pupils equal, extraocular movements intact  Respiratory: Patient's speak in full sentences and does not appear short of breath  Cardiovascular: No lower extremity edema, non tender, no erythema  Antalgic gait noted.  Patient is on the right knee have some crepitus noted.  Some instability noted with valgus and varus force.  Lacks only to 95 degrees of flexion.    Impression and Recommendations:     The above documentation has been reviewed and is accurate and complete Judi Saa, DO

## 2022-08-17 ENCOUNTER — Ambulatory Visit (INDEPENDENT_AMBULATORY_CARE_PROVIDER_SITE_OTHER): Payer: Medicare Other | Admitting: Family Medicine

## 2022-08-17 VITALS — BP 108/64 | HR 76 | Ht 64.0 in | Wt 211.0 lb

## 2022-08-17 DIAGNOSIS — M170 Bilateral primary osteoarthritis of knee: Secondary | ICD-10-CM

## 2022-08-17 NOTE — Patient Instructions (Signed)
Great to see you  Please use the 1 aleve very sparingly and if you notice any bruising or stomach pain would need to stop it immediately.  Feet above heart for the swelling in the legs  See me again in 2-3 months and if not better then lets consider injection

## 2022-08-17 NOTE — Assessment & Plan Note (Addendum)
Patient has not had an injection for quite some time.  We did discuss the possibility of repeating injections.  Patient wants to hold at this time.  Discussed icing regimen and home exercises.  Discussed which activities will be beneficial in which case could be detrimental.  6 to 8 weeks otherwise if worsening symptoms we could consider the possibility of injections.  Patient does physical therapy total time with patient 31 minutes

## 2022-08-19 ENCOUNTER — Encounter: Payer: Self-pay | Admitting: Urology

## 2022-08-19 ENCOUNTER — Ambulatory Visit (INDEPENDENT_AMBULATORY_CARE_PROVIDER_SITE_OTHER): Payer: Medicare Other | Admitting: Urology

## 2022-08-19 VITALS — BP 120/75 | HR 76 | Ht 64.0 in | Wt 206.0 lb

## 2022-08-19 DIAGNOSIS — R8290 Unspecified abnormal findings in urine: Secondary | ICD-10-CM

## 2022-08-19 DIAGNOSIS — N3941 Urge incontinence: Secondary | ICD-10-CM

## 2022-08-19 LAB — BLADDER SCAN AMB NON-IMAGING

## 2022-08-19 MED ORDER — SOLIFENACIN SUCCINATE 10 MG PO TABS: 10.0000 mg | ORAL_TABLET | Freq: Every day | ORAL | 11 refills | Status: DC

## 2022-08-19 NOTE — Progress Notes (Signed)
Assessment: 1. Urge incontinence   2. Abnormal urine findings     Plan: I personally reviewed the patient's chart including provider notes. Diagnosis and management of overactive bladder and urge incontinence discussed with the patient.  Options for management including behavioral modification, avoidance of dietary irritants, medical therapy, neuromodulation, and chemodenervation discussed. Bladder diet sheet provided. Trial of Solifenacin 10 mg daily.  Prescription provided. Urine culture sent today. Return to office in 1 month.   Chief Complaint:  Chief Complaint  Patient presents with   Urinary Incontinence    History of Present Illness:  Heather [name] is a 77 y.o. female who is seen in consultation from Pincus Sanes, MD for evaluation of urge incontinence.  She has had symptoms for >1 year.  She has frequency, urgency, and urge incontinence.  No nocturia or nocturnal incontinence.  She is wearing adult diapers daily, changing at least 1 time/day.  No dysuria or gross hematuria.  She has been on trospium 20 mg BID without improvement in her symptoms.  She has not tried any other medical therapy.   No recent UTI's.   No fecal incontinence.  No significant problems with constipation.   Past Medical History:  Past Medical History:  Diagnosis Date   Abdominal hernia    Abnormal Pap smear    ALLERGIC RHINITIS 10/22/2006   Allergy    ANEMIA 12/18/2008   Arthritis    scoliosis moderate deg changes lumbar Xray 11/04/17    Asthma    ASYMPTOMATIC POSTMENOPAUSAL STATUS 11/22/2007   Back pain    CAD (coronary artery disease)    in LAD   CHF (congestive heart failure) (HCC)    Complication of anesthesia 2010   pt states she woke up during colonoscopy and was aware of "losing control of breathing" during mammoplasty   Degenerative arthritis    Diverticulosis    Dyslipidemia    Fibroid    Frequent headaches    GERD 07/20/2007   GOITER, MULTINODULAR 07/20/2007    Headache(784.0) 07/20/2007   HEARING LOSS 11/22/2007   Hiatal hernia    Hiatal hernia    large   History of chicken pox    Hx of colposcopy with cervical biopsy    HYPERCHOLESTEROLEMIA 01/13/2007   Hyperglycemia    HYPERTENSION 10/22/2006   Lung nodule    unchanged since 05/26/13    Migraines    NASH (nonalcoholic steatohepatitis)    Nocturnal hypoxemia 02/17/2013   Obesity    Obstructive sleep apnea    OSA on CPAP    per neurology   OSTEOARTHRITIS 10/22/2006   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   Pre-diabetes    Sedimentation rate elevation    Sleep apnea    on cpap   Urine incontinence    UTI (urinary tract infection)     Past Surgical History:  Past Surgical History:  Procedure Laterality Date   BREAST SURGERY  1984   Breast reduction b/l    CATARACT EXTRACTION, BILATERAL     DEXA  08/2005   DILATION AND CURETTAGE OF UTERUS     ELECTROCARDIOGRAM  10/15/2006   ESOPHAGOGASTRODUODENOSCOPY  12/08/2005   FOOT SURGERY     hammertoe and bunion 05/2017    Stress Cardiolite  10/21/2005   sweat gland removal     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   Procedure: Left TOTAL KNEE ARTHROPLASTY;  Surgeon: Kathryne Hitch, MD;  Location: [facility] OR;  Service: Orthopedics;  Laterality: Left;   UMBILICAL HERNIA  REPAIR N/A 04/25/2022   Procedure: OPEN INCARCERATED HERNIA REPAIR UMBILICAL WITH APPENDECTOMY;  Surgeon: Quentin Ore, MD;  Location: [facility] OR;  Service: General;  Laterality: N/A;   WISDOM TOOTH EXTRACTION      Allergies:  Allergies  Allergen Reactions   Aspirin Hives   Coconut (Cocos Nucifera) Hives   Lisinopril Cough   Metoprolol Itching   Other     Latex paint, the smell causes hives. Not allergic to latex gloves   General anesthesia - has had issues in past where anesthesia was not properly administered and caused side effects   Peanut-Containing Drug Products Hives, Itching and Other (See Comments)   Prednisone     Unknown reaction     Strawberry Extract Hives   Penicillins Rash    Family History:  Family History  Problem Relation Age of Onset   Asthma Mother    Depression Mother    Bipolar disorder Mother    Dementia Mother    Arthritis Mother    Breast cancer Mother        primary   Colon cancer Mother        mets from breast   Cancer Mother        colon   Hypertension Father    Migraines Father    Cancer Maternal Aunt        ?   Heart disease Maternal Grandmother    Cancer Maternal Grandfather        stomach ?   Sleep apnea Neg Hx    Esophageal cancer Neg Hx    Stomach cancer Neg Hx    Rectal cancer Neg Hx     Social History:  Social History   Tobacco Use   Smoking status: Former    Packs/day: 2.00    Years: 20.00    Additional pack years: 0.00    Total pack years: 40.00    Types: Cigarettes    Quit date: 03/03/1979    Years since quitting: 43.4   Smokeless tobacco: Never  Vaping Use   Vaping Use: Never used  Substance Use Topics   Alcohol use: Yes    Comment: rare - TWICE A YR   Drug use: No    Review of symptoms:  Constitutional:  Negative for unexplained weight loss, night sweats, fever, chills ENT:  Negative for nose bleeds, sinus pain, painful swallowing CV:  Negative for chest pain, shortness of breath, exercise intolerance, palpitations, loss of consciousness Resp:  Negative for cough, wheezing, shortness of breath GI:  Negative for nausea, vomiting, diarrhea, bloody stools GU:  Positives noted in HPI; otherwise negative for gross hematuria, dysuria Neuro:  Negative for seizures, poor balance, limb weakness, slurred speech Psych:  Negative for lack of energy, depression, anxiety Endocrine:  Negative for polydipsia, polyuria, symptoms of hypoglycemia (dizziness, hunger, sweating) Hematologic:  Negative for anemia, purpura, petechia, prolonged or excessive bleeding, use of anticoagulants  Allergic:  Negative for difficulty breathing or choking as a result of exposure to  anything; no shellfish allergy; no allergic response (rash/itch) to materials, foods  Physical exam: BP 120/75   Pulse 76   Ht 5\' 4"  (1.626 m)   Wt 206 lb (93.4 kg)   BMI 35.36 kg/m  GENERAL APPEARANCE:  Well appearing, well developed, well nourished, NAD HEENT: Atraumatic, Normocephalic, oropharynx clear. NECK: Supple without lymphadenopathy or thyromegaly. LUNGS: Clear to auscultation bilaterally. HEART: Regular Rate and Rhythm without murmurs, gallops, or rubs. ABDOMEN: Soft, non-tender, No Masses. EXTREMITIES: Moves all extremities  well.  Without clubbing, cyanosis, or edema. NEUROLOGIC:  Alert and oriented x 3,  CN II-XII grossly intact.  MENTAL STATUS:  Appropriate. BACK:  Non-tender to palpation.  No CVAT SKIN:  Warm, dry and intact.    Results: U/A:  11-30 WBC, 0-2 RBC, mod bacteria  Results for orders placed or performed in visit on 08/19/22 (from the past 24 hour(s))  Bladder Scan (Post Void Residual) in office   Collection Time: 08/19/22  3:14 PM  Result Value Ref Range   Scan Result 4mL    *Note: Due to a large number of results and/or encounters for the requested time period, some results have not been displayed. A complete set of results can be found in Results Review.

## 2022-08-20 LAB — URINALYSIS, ROUTINE W REFLEX MICROSCOPIC
Bilirubin, UA: NEGATIVE
Glucose, UA: NEGATIVE
Nitrite, UA: NEGATIVE
Specific Gravity, UA: 1.025 (ref 1.005–1.030)
Urobilinogen, Ur: 0.2 mg/dL (ref 0.2–1.0)
pH, UA: 5.5 (ref 5.0–7.5)

## 2022-08-20 LAB — MICROSCOPIC EXAMINATION
Cast Type: NONE SEEN
Casts: NONE SEEN /lpf
Crystal Type: NONE SEEN
Crystals: NONE SEEN
Trichomonas, UA: NONE SEEN
Yeast, UA: NONE SEEN

## 2022-08-21 ENCOUNTER — Ambulatory Visit: Payer: Medicare Other

## 2022-08-21 ENCOUNTER — Telehealth: Payer: Self-pay | Admitting: Urology

## 2022-08-21 DIAGNOSIS — G8929 Other chronic pain: Secondary | ICD-10-CM | POA: Diagnosis not present

## 2022-08-21 DIAGNOSIS — M25561 Pain in right knee: Secondary | ICD-10-CM | POA: Diagnosis not present

## 2022-08-21 DIAGNOSIS — M6281 Muscle weakness (generalized): Secondary | ICD-10-CM | POA: Diagnosis not present

## 2022-08-21 DIAGNOSIS — R262 Difficulty in walking, not elsewhere classified: Secondary | ICD-10-CM | POA: Diagnosis not present

## 2022-08-21 DIAGNOSIS — M25562 Pain in left knee: Secondary | ICD-10-CM | POA: Diagnosis not present

## 2022-08-21 LAB — URINE CULTURE

## 2022-08-21 NOTE — Therapy (Signed)
OUTPATIENT PHYSICAL THERAPY TREATMENT NOTE   Patient Name: Heather [name] MRN: [id number] DOB:May 03, 1945, 77 y.o., female Today's Date: 08/21/2022  END OF SESSION:  PT End of Session - 08/21/22 1130     Visit Number 2    Number of Visits 17    Date for PT Re-Evaluation 10/16/22    Authorization Type MEDICARE PART A AND B    PT Start Time 1130    PT Stop Time 1215    PT Time Calculation (min) 45 min    Activity Tolerance Patient tolerated treatment well    Behavior During Therapy [facility] for tasks assessed/performed              Past Medical History:  Diagnosis Date   Abdominal hernia    Abnormal Pap smear    ALLERGIC RHINITIS 10/22/2006   Allergy    ANEMIA 12/18/2008   Arthritis    scoliosis moderate deg changes lumbar Xray 11/04/17    Asthma    ASYMPTOMATIC POSTMENOPAUSAL STATUS 11/22/2007   Back pain    CAD (coronary artery disease)    in LAD   CHF (congestive heart failure) (HCC)    Complication of anesthesia 2010   pt states she woke up during colonoscopy and was aware of "losing control of breathing" during mammoplasty   Degenerative arthritis    Diverticulosis    Dyslipidemia    Fibroid    Frequent headaches    GERD 07/20/2007   GOITER, MULTINODULAR 07/20/2007   Headache(784.0) 07/20/2007   HEARING LOSS 11/22/2007   Hiatal hernia    Hiatal hernia    large   History of chicken pox    Hx of colposcopy with cervical biopsy    HYPERCHOLESTEROLEMIA 01/13/2007   Hyperglycemia    HYPERTENSION 10/22/2006   Lung nodule    unchanged since 05/26/13    Migraines    NASH (nonalcoholic steatohepatitis)    Nocturnal hypoxemia 02/17/2013   Obesity    Obstructive sleep apnea    OSA on CPAP    per neurology   OSTEOARTHRITIS 10/22/2006   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   Pre-diabetes    Sedimentation rate elevation    Sleep apnea    on cpap   Urine incontinence    UTI (urinary tract infection)    Past Surgical History:  Procedure  Laterality Date   BREAST SURGERY  1984   Breast reduction b/l    CATARACT EXTRACTION, BILATERAL     DEXA  08/2005   DILATION AND CURETTAGE OF UTERUS     ELECTROCARDIOGRAM  10/15/2006   ESOPHAGOGASTRODUODENOSCOPY  12/08/2005   FOOT SURGERY     hammertoe and bunion 05/2017    Stress Cardiolite  10/21/2005   sweat gland removal     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   Procedure: Left TOTAL KNEE ARTHROPLASTY;  Surgeon: Kathryne Hitch, MD;  Location: [facility] OR;  Service: Orthopedics;  Laterality: Left;   UMBILICAL HERNIA REPAIR N/A 04/25/2022   Procedure: OPEN INCARCERATED HERNIA REPAIR UMBILICAL WITH APPENDECTOMY;  Surgeon: Quentin Ore, MD;  Location: [facility] OR;  Service: General;  Laterality: N/A;   WISDOM TOOTH EXTRACTION     Patient Active Problem List   Diagnosis Date Noted   Urge incontinence 08/04/2022   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 06/16/2022   Hypotension 05/26/2022   BMI 34.0-34.9,adult 05/06/2022   Obesity, Beginning BMI 44.60 05/06/2022   SBO (small bowel obstruction) (HCC) 04/25/2022   Obstructive sleep apnea  treated with BiPAP 04/25/2022   Dependence on nocturnal oxygen therapy 04/25/2022   Hyponatremia 04/25/2022   Generalized abdominal pain 04/23/2022   BMI 36.0-36.9,adult 04/08/2022   Persistent cough for 3 weeks or longer 02/13/2022   Traumatic tear of supraspinatus tendon of right shoulder 11/24/2021   Right arm pain 11/18/2021   Positive self-administered antigen test for COVID-19 11/09/2021   Stress 10/16/2021   Status post total left knee replacement 03/11/2021   Chronic pain 01/06/2021   Lumbar radiculopathy 07/04/2020   Bilateral sciatica 07/04/2020   Pain in both hands 07/04/2020   Hyperlipidemia associated with type 2 diabetes mellitus (HCC) 05/01/2020   Abnormal blood level of copper 03/14/2020   Leukopenia 03/14/2020   Obesity (BMI 30-39.9) 11/13/2019   Chronic pain of right thumb  10/11/2019   Vitamin D deficiency 07/27/2019   Memory loss 04/27/2019   Mass of left wrist 01/18/2019   Ventral hernia without obstruction or gangrene 01/18/2019   Mass of right thigh 09/13/2018   Depression 08/02/2018   Sleeps in sitting position due to orthopnea 07/13/2018   BiPAP (biphasic positive airway pressure) dependence 05/24/2018   Goiter 04/15/2017   Urinary incontinence 04/15/2017   Type 2 diabetes mellitus without complication, without long-term current use of insulin (HCC) 02/15/2017   Bilateral leg cramps 01/05/2017   Peripheral neuropathy 05/27/2016   Elevated sed rate 03/05/2016   Muscle spasm of both lower legs 02/12/2016   Leg edema 02/12/2016   Iron deficiency anemia 02/12/2016   Hypokalemia 02/12/2016   Pernicious anemia 02/12/2016   Degenerative disc disease, lumbar 04/09/2015   Degenerative arthritis of knee, bilateral 03/13/2015   Chronic low back pain 03/13/2015   CAD S/P percutaneous coronary angioplasty 10/31/2013   Incidental lung nodule, > 3mm and < 8mm, Left lower lobe 7mm - June 2014, April 2015 unchanged 06/25/2013   OSA on CPAP 06/21/2013   Myalgia and myositis 05/02/2013   Muscle ache 04/03/2013   Class 3 severe obesity with serious comorbidity and body mass index (BMI) of 40.0 to 44.9 in adult (HCC) 01/24/2013   Large hiatal hernia 08/24/2012    LLL nodule 6mm - June 2014, described in 2003 07/13/2012   Asthma, chronic 06/29/2012   Migraine without aura 03/11/2012   SOB (shortness of breath) 12/28/2011   Hx of fibroids 08/03/2011   Knee pain, left 12/25/2010   NECK PAIN 12/03/2009   TINNITUS 11/22/2007   HEARING LOSS 11/22/2007   Chronic pain of both knees 11/22/2007   Gastroesophageal reflux disease without esophagitis 07/20/2007   NASH (nonalcoholic steatohepatitis) 07/20/2007   ANXIETY 10/22/2006   Allergic rhinitis 10/22/2006   Osteoarthritis 10/22/2006    PCP: Pincus Sanes, MD  REFERRING PROVIDER: Judi Saa, DO    REFERRING DIAG: M25.561,M25.562,G89.29 (ICD-10-CM) - Chronic pain of both knees   THERAPY DIAG:  Chronic pain of left knee  Chronic pain of right knee  Muscle weakness (generalized)  Difficulty in walking, not elsewhere classified  Rationale for Evaluation and Treatment: Rehabilitation  ONSET DATE: 5 months  SUBJECTIVE:   SUBJECTIVE STATEMENT: Patient reports that she fell the day before yesterday and that she did not hit her head, but that she had to get on her knees to get up. She describes more tightness than pain in BIL knees.    PERTINENT HISTORY: High BMI, CHF, CAD, L TKR, degenerative arthritis  PAIN:  Are you having pain? Yes: NPRS scale: 53/10 Pain location: Both knees Pain description: Tightness/achiness Aggravating factors: Cold, extended feet, after  periods of rest Relieving factors: Occasional use of pain medication, seated elliptical Normal Range: 3-6/10  PRECAUTIONS: None  WEIGHT BEARING RESTRICTIONS: No  FALLS:  Has patient fallen in last 6 months? 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, reads, church-sings in choir  PLOF: Independent  PATIENT GOALS: Pt be Ind c better quality of movement  NEXT MD VISIT: Next week  OBJECTIVE:   DIAGNOSTIC FINDINGS:  DG L knee 07/10/22 L knee Narrative: Well-seated prosthesis without complication   PATIENT SURVEYS:  FOTO: Perceived function   47%, predicted   56%   COGNITION: Overall cognitive status: Within functional limits for tasks assessed     SENSATION: WFL  EDEMA:  Swelling of both legs c pitting edema of ankle at sock line  MUSCLE LENGTH: Hamstrings: Right 40 deg; Left 40 deg Thomas test: Right Tight deg; Left tight  deg  POSTURE: rounded shoulders, forward head, and increased thoracic kyphosis  PALPATION: Not overly TTP of the knees  LOWER EXTREMITY ROM:  Active ROM Right eval Left eval  Hip flexion    Hip extension    Hip abduction    Hip adduction    Hip internal rotation    Hip external rotation    Knee flexion 95 100  Knee extension 10 0  Ankle dorsiflexion    Ankle plantarflexion    Ankle inversion    Ankle eversion     (Blank rows = not tested)  LOWER EXTREMITY MMT:  MMT Right eval Left eval  Hip flexion 4- 4-  Hip extension 3 3  Hip abduction 4- 4-  Hip adduction    Hip internal rotation    Hip external rotation 4 4  Knee flexion 4+ 4+  Knee extension 5 5  Ankle dorsiflexion    Ankle plantarflexion    Ankle inversion    Ankle eversion     (Blank rows = not tested)  LOWER EXTREMITY SPECIAL TESTS:    For the R knee: Knee special tests: Anterior drawer test: negative, Posterior drawer test: negative, and McMurray's test: negative  FUNCTIONAL TESTS:  5 times sit to stand: 23.9 2 minute walk test: 370'  Single leg stance: 1" GAIT: Distance walked: 72' Assistive device utilized: None Level of assistance: Complete Independence Comments: Slower pace   TODAY'S TREATMENT:        [facility] Adult PT Treatment:                                                DATE: 08/21/22 Aquatic therapy at [facility]- [address] - therapeutic pool temp approximately 92 degrees. Pt enters building ambulating without AD and aide of another person. Treatment took place in water 3.8 to  4 ft 8 in.feet deep depending upon activity.  Pt entered and exited the pool via stair and handrails independently. Patient entered water for aquatic therapy for first time and was introduced to principles and therapeutic effects of water as they ambulated and acclimated to pool.  Therapeutic Exercise: Aquatic Exercise: Walking forward/backwards/side stepping Standing with UE support edge of  pool: Hip abd/add x20 BIL Hip ext/flex with knee straight x  20 BIL Hip Circles CC/CCW x10 each BIL Marching hip flexion to knee extension 2x10 BIL Hamstring curl x20 BIL Squats 2x20 Heel/toe raises x20 Step ups on submerged step x10 BIL Pt requires the buoyancy of water for active assisted exercises with buoyancy supported for strengthening and AROM exercises. Hydrostatic pressure also supports joints by unweighting joint load by at least 50 % in 3-4 feet depth water. 80% in chest to neck deep water. Water will provide assistance with movement using the current and laminar flow while the buoyancy reduces weight bearing. Pt requires the viscosity of the water for resistance with strengthening exercises.                                                                                                                          [facility] Adult PT Treatment:                                                DATE: 08/12/22 Therapeutic Exercise: Developed, instructed in, and pt completed therex as noted in HEP   PATIENT EDUCATION:  Education details: Eval findings, POC, HEP, self care  Person educated: Patient Education method: Explanation, Demonstration, Tactile cues, Verbal cues, and Handouts Education comprehension: verbalized understanding, returned demonstration, verbal cues required, and tactile cues required  HOME EXERCISE PROGRAM: Access Code: [email or web address] URL: [email or web address] Date: 08/12/2022 Prepared by: Joellyn Rued  Exercises - Active Straight Leg Raise with Quad Set  - 1 x daily - 7 x weekly - 2 sets - 10 reps - 3 hold - Hooklying Clamshell with Resistance  - 1 x daily - 7 x weekly - 2 sets - 10 reps - 3 hold - Sidelying Hip Abduction  - 1 x daily - 7 x weekly - 2 sets - 10 reps - 3 hold - Sit to Stand with Counter Support  - 1 x daily - 7 x weekly - 2 sets - 10 reps - 3 hold  ASSESSMENT:  CLINICAL IMPRESSION: Patient presents to first aquatic PT session reporting a  recent fall where she did not hit her head but did have to get on her knees to get back up. She reports HEP non-compliance at this time. Session today focused on BIL LE strengthening in the aquatic environment for use of buoyancy to offload joints and the viscosity of water as resistance during therapeutic exercise. Patient was able to tolerate all prescribed exercises in the aquatic environment. Patient continues to benefit from skilled PT services on land and aquatic based and should be progressed as able to improve functional independence.   OBJECTIVE IMPAIRMENTS: decreased activity tolerance, decreased balance, difficulty walking, decreased ROM, decreased strength, obesity, and pain.   ACTIVITY LIMITATIONS: carrying, lifting, bending, sitting, standing, squatting, stairs, and locomotion level  PARTICIPATION LIMITATIONS: meal prep, cleaning, laundry, community activity, and church  PERSONAL FACTORS: High BMI,  CHF, CAD, L TKR, degenerative arthritis are also affecting patient's functional outcome.   REHAB POTENTIAL: Good  CLINICAL DECISION MAKING: Evolving/moderate complexity  EVALUATION COMPLEXITY: Moderate   GOALS:  SHORT TERM GOALS: Target date: 09/04/22 Pt will be Ind in an initial HEP Baseline: started Goal status: INITIAL  LONG TERM GOALS: Target date: 10/16/22  Pt will be Ind in a final HEP to maintain achieved LOF  Baseline: started Goal status: INITIAL  2.  Increase single standing for each LE to greater than 5 sec for improved functional mobility Baseline: 1" each LE Goal status: INITIAL  3.  Increase bilat knee and hip strength by at least 1/2 muscle strength grade for improved functional mobility  Baseline: see flow sheets Goal status: INITIAL  4.  Improve 5xSTS by MCID of 5" and by MCID of 60ft as indication of improved functional mobility  Baseline: 5xSTS c use of hands from bari-chair 23.9". 370" Goal status: INITIAL  5.  Pt's FOTO score will  improved to the predicted value of 56% as indication of improved function  Baseline: 47% Goal status: INITIAL PLAN:  PT FREQUENCY: 2x/week  PT DURATION: 8 weeks  PLANNED INTERVENTIONS: Therapeutic exercises, Therapeutic activity, Neuromuscular re-education, Balance training, Gait training, Patient/Family education, Self Care, Joint mobilization, Aquatic Therapy, Dry Needling, Electrical stimulation, Cryotherapy, Moist heat, Taping, Vasopneumatic device, Ultrasound, Ionotophoresis 4mg /ml Dexamethasone, Manual therapy, and Re-evaluation  PLAN FOR NEXT SESSION: Review FOTO; assess response to HEP; progress therex as indicated; use of modalities, manual therapy; and TPDN as indicated.  Berta Minor PTA 08/21/22 12:18 PM

## 2022-08-21 NOTE — Telephone Encounter (Signed)
calling for test results  returning call

## 2022-08-21 NOTE — Telephone Encounter (Signed)
Just missed the call when the phone rang. Would like a call back.

## 2022-08-21 NOTE — Telephone Encounter (Signed)
Returned pts call, answered her questions. Pt concerned with all the abnormal values on urinalysis. Advised pt Dr thinks its related to vaginal contamination of the specimen. Pt expressed understanding.

## 2022-08-24 DIAGNOSIS — E119 Type 2 diabetes mellitus without complications: Secondary | ICD-10-CM | POA: Diagnosis not present

## 2022-08-26 ENCOUNTER — Ambulatory Visit (INDEPENDENT_AMBULATORY_CARE_PROVIDER_SITE_OTHER): Payer: Medicare Other | Admitting: Family Medicine

## 2022-08-26 ENCOUNTER — Encounter (INDEPENDENT_AMBULATORY_CARE_PROVIDER_SITE_OTHER): Payer: Self-pay | Admitting: Family Medicine

## 2022-08-26 VITALS — BP 105/65 | HR 63 | Temp 98.0°F | Ht 64.0 in | Wt 208.0 lb

## 2022-08-26 DIAGNOSIS — Z6835 Body mass index (BMI) 35.0-35.9, adult: Secondary | ICD-10-CM | POA: Diagnosis not present

## 2022-08-26 DIAGNOSIS — E669 Obesity, unspecified: Secondary | ICD-10-CM | POA: Diagnosis not present

## 2022-08-26 DIAGNOSIS — E1169 Type 2 diabetes mellitus with other specified complication: Secondary | ICD-10-CM | POA: Diagnosis not present

## 2022-08-26 DIAGNOSIS — N3941 Urge incontinence: Secondary | ICD-10-CM | POA: Diagnosis not present

## 2022-08-27 NOTE — Progress Notes (Signed)
Chief Complaint:   OBESITY Heather [name] is here to discuss her progress with her obesity treatment plan along with follow-up of her obesity related diagnoses. Heather [name] is on the Category 2 Plan or following a lower carbohydrate, vegetable and lean protein rich diet plan and states she is following her eating plan approximately 90-95% of the time. Heather [name] states she is doing physical therapy water exercises and on the elliptical 2 times per week.    Today's visit was #: 85 Starting weight: 268 lbs Starting date: 12/23/2016 Today's weight: 208 lbs Today's date: 08/26/2022 Total lbs lost to date: 60 Total lbs lost since last in-office visit: 6  Interim History: Patient has done especially well with her weight loss over the last 3 weeks.  She changed to a low carbohydrate plan and she has tried to increase her exercise.  She would like to change back to the category 2 plan.  Subjective:   1. Urge incontinence Patient was seen by urology and she is being worked up.  She started Vesicare and she feels this is already improving.  2. Type 2 diabetes mellitus with other specified complication, unspecified whether long term insulin use (HCC) Patient's glucose is controlled with her diet.  Her recent A1c was 6.2.  She is not checking her glucose level at home.  Assessment/Plan:   1. Urge incontinence Patient is to continue Vesicare for now and we will plan to increase her exercise now that she is having less accidents.  2. Type 2 diabetes mellitus with other specified complication, unspecified whether long term insulin use (HCC) Patient will continue with her diet and exercise, and we will continue to monitor her labs.  3. BMI 35.0-35.9,adult  4. Obesity, Beginning BMI 44.60 Heather [name] is currently in the action stage of change. As such, her goal is to continue with weight loss efforts. She has agreed to the Category 2 Plan with breakfast options.   Breakfast options were discussed and various choices  were reviewed in depth.  Exercise goals: As is.   Behavioral modification strategies: increasing lean protein intake.  Heather [name] has agreed to follow-up with our clinic in 4 weeks. She was informed of the importance of frequent follow-up visits to maximize her success with intensive lifestyle modifications for her multiple health conditions.   Objective:   Blood pressure 105/65, pulse 63, temperature 98 F (36.7 C), height 5\' 4"  (1.626 m), weight 208 lb (94.3 kg), SpO2 94 %. Body mass index is 35.7 kg/m.  Lab Results  Component Value Date   CREATININE 0.72 05/26/2022   BUN 10 05/26/2022   NA 140 05/26/2022   K 3.6 05/26/2022   CL 104 05/26/2022   CO2 30 05/26/2022   Lab Results  Component Value Date   ALT 16 05/26/2022   AST 21 05/26/2022   ALKPHOS 64 05/26/2022   BILITOT 0.3 05/26/2022   Lab Results  Component Value Date   HGBA1C 6.2 05/26/2022   HGBA1C 6.1 11/21/2021   HGBA1C 5.3 04/14/2021   HGBA1C 5.8 (H) 03/07/2021   HGBA1C 6.1 (H) 11/25/2020   Lab Results  Component Value Date   INSULIN 6.0 11/25/2020   INSULIN 4.3 10/09/2019   INSULIN 8.6 07/05/2019   INSULIN 6.4 12/05/2018   INSULIN 3.6 04/27/2018   Lab Results  Component Value Date   TSH 0.62 05/26/2022   Lab Results  Component Value Date   CHOL 145 05/26/2022   HDL 62.50 05/26/2022   LDLCALC 60 05/26/2022   TRIG 111.0  05/26/2022   CHOLHDL 2 05/26/2022   Lab Results  Component Value Date   VD25OH 48.25 11/21/2021   VD25OH 55.3 04/14/2021   VD25OH 66.2 11/25/2020   Lab Results  Component Value Date   WBC 4.8 05/26/2022   HGB 12.0 05/26/2022   HCT 36.5 05/26/2022   MCV 95.2 05/26/2022   PLT 246.0 05/26/2022   Lab Results  Component Value Date   IRON 49 05/26/2022   TIBC 211.4 (L) 05/26/2022   FERRITIN 288.0 05/26/2022   Attestation Statements:   Reviewed by clinician on day of visit: allergies, medications, problem list, medical history, surgical history, family history, social  history, and previous encounter notes.  Time spent on visit including pre-visit chart review and post-visit care and charting was 40 minutes.   I, Burt Knack, am acting as transcriptionist for Quillian Quince, MD.  I have reviewed the above documentation for accuracy and completeness, and I agree with the above. -  Quillian Quince, MD

## 2022-08-27 NOTE — Therapy (Signed)
OUTPATIENT PHYSICAL THERAPY TREATMENT NOTE   Patient Name: Heather [name] MRN: [id number] DOB:29-Dec-1945, 77 y.o., female Today's Date: 08/28/2022  END OF SESSION:  PT End of Session - 08/28/22 1123     Visit Number 3    Number of Visits 17    Date for PT Re-Evaluation 10/16/22    Authorization Type MEDICARE PART A AND B    PT Start Time 1130    PT Stop Time 1215    PT Time Calculation (min) 45 min    Activity Tolerance Patient tolerated treatment well    Behavior During Therapy [facility] for tasks assessed/performed               Past Medical History:  Diagnosis Date   Abdominal hernia    Abnormal Pap smear    ALLERGIC RHINITIS 10/22/2006   Allergy    ANEMIA 12/18/2008   Arthritis    scoliosis moderate deg changes lumbar Xray 11/04/17    Asthma    ASYMPTOMATIC POSTMENOPAUSAL STATUS 11/22/2007   Back pain    CAD (coronary artery disease)    in LAD   CHF (congestive heart failure) (HCC)    Complication of anesthesia 2010   pt states she woke up during colonoscopy and was aware of "losing control of breathing" during mammoplasty   Degenerative arthritis    Diverticulosis    Dyslipidemia    Fibroid    Frequent headaches    GERD 07/20/2007   GOITER, MULTINODULAR 07/20/2007   Headache(784.0) 07/20/2007   HEARING LOSS 11/22/2007   Hiatal hernia    Hiatal hernia    large   History of chicken pox    Hx of colposcopy with cervical biopsy    HYPERCHOLESTEROLEMIA 01/13/2007   Hyperglycemia    HYPERTENSION 10/22/2006   Lung nodule    unchanged since 05/26/13    Migraines    NASH (nonalcoholic steatohepatitis)    Nocturnal hypoxemia 02/17/2013   Obesity    Obstructive sleep apnea    OSA on CPAP    per neurology   OSTEOARTHRITIS 10/22/2006   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   Pre-diabetes    Sedimentation rate elevation    Sleep apnea    on cpap   Urine incontinence    UTI (urinary tract infection)    Past Surgical History:  Procedure  Laterality Date   BREAST SURGERY  1984   Breast reduction b/l    CATARACT EXTRACTION, BILATERAL     DEXA  08/2005   DILATION AND CURETTAGE OF UTERUS     ELECTROCARDIOGRAM  10/15/2006   ESOPHAGOGASTRODUODENOSCOPY  12/08/2005   FOOT SURGERY     hammertoe and bunion 05/2017    Stress Cardiolite  10/21/2005   sweat gland removal     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   Procedure: Left TOTAL KNEE ARTHROPLASTY;  Surgeon: Kathryne Hitch, MD;  Location: [facility] OR;  Service: Orthopedics;  Laterality: Left;   UMBILICAL HERNIA REPAIR N/A 04/25/2022   Procedure: OPEN INCARCERATED HERNIA REPAIR UMBILICAL WITH APPENDECTOMY;  Surgeon: Quentin Ore, MD;  Location: [facility] OR;  Service: General;  Laterality: N/A;   WISDOM TOOTH EXTRACTION     Patient Active Problem List   Diagnosis Date Noted   Urge incontinence 08/04/2022   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 06/16/2022   Hypotension 05/26/2022   BMI 34.0-34.9,adult 05/06/2022   Obesity, Beginning BMI 44.60 05/06/2022   SBO (small bowel obstruction) (HCC) 04/25/2022   Obstructive sleep  apnea treated with BiPAP 04/25/2022   Dependence on nocturnal oxygen therapy 04/25/2022   Hyponatremia 04/25/2022   Generalized abdominal pain 04/23/2022   BMI 36.0-36.9,adult 04/08/2022   Obesity, Beginning BMI 44.60 04/08/2022   Persistent cough for 3 weeks or longer 02/13/2022   Type 2 diabetes mellitus with other specified complication (HCC) 12/10/2021   Traumatic tear of supraspinatus tendon of right shoulder 11/24/2021   Right arm pain 11/18/2021   Positive self-administered antigen test for COVID-19 11/09/2021   Stress 10/16/2021   Status post total left knee replacement 03/11/2021   Chronic pain 01/06/2021   Lumbar radiculopathy 07/04/2020   Bilateral sciatica 07/04/2020   Pain in both hands 07/04/2020   Hyperlipidemia associated with type 2 diabetes mellitus (HCC) 05/01/2020   Abnormal blood  level of copper 03/14/2020   Leukopenia 03/14/2020   Obesity (BMI 30-39.9) 11/13/2019   Chronic pain of right thumb 10/11/2019   Vitamin D deficiency 07/27/2019   Memory loss 04/27/2019   Mass of left wrist 01/18/2019   Ventral hernia without obstruction or gangrene 01/18/2019   Mass of right thigh 09/13/2018   Depression 08/02/2018   Sleeps in sitting position due to orthopnea 07/13/2018   BiPAP (biphasic positive airway pressure) dependence 05/24/2018   Goiter 04/15/2017   Urinary incontinence 04/15/2017   Type 2 diabetes mellitus without complication, without long-term current use of insulin (HCC) 02/15/2017   Bilateral leg cramps 01/05/2017   Peripheral neuropathy 05/27/2016   Elevated sed rate 03/05/2016   Muscle spasm of both lower legs 02/12/2016   Leg edema 02/12/2016   Iron deficiency anemia 02/12/2016   Hypokalemia 02/12/2016   Pernicious anemia 02/12/2016   Degenerative disc disease, lumbar 04/09/2015   Degenerative arthritis of knee, bilateral 03/13/2015   Chronic low back pain 03/13/2015   CAD S/P percutaneous coronary angioplasty 10/31/2013   Incidental lung nodule, > 3mm and < 8mm, Left lower lobe 7mm - June 2014, April 2015 unchanged 06/25/2013   OSA on CPAP 06/21/2013   Myalgia and myositis 05/02/2013   Muscle ache 04/03/2013   Class 3 severe obesity with serious comorbidity and body mass index (BMI) of 40.0 to 44.9 in adult (HCC) 01/24/2013   Large hiatal hernia 08/24/2012    LLL nodule 6mm - June 2014, described in 2003 07/13/2012   Asthma, chronic 06/29/2012   Migraine without aura 03/11/2012   SOB (shortness of breath) 12/28/2011   Hx of fibroids 08/03/2011   Knee pain, left 12/25/2010   NECK PAIN 12/03/2009   TINNITUS 11/22/2007   HEARING LOSS 11/22/2007   Chronic pain of both knees 11/22/2007   Gastroesophageal reflux disease without esophagitis 07/20/2007   NASH (nonalcoholic steatohepatitis) 07/20/2007   ANXIETY 10/22/2006   Allergic rhinitis  10/22/2006   Osteoarthritis 10/22/2006    PCP: Pincus Sanes, MD  REFERRING PROVIDER: Judi Saa, DO   REFERRING DIAG: M25.561,M25.562,G89.29 (ICD-10-CM) - Chronic pain of both knees   THERAPY DIAG:  Chronic pain of left knee  Chronic pain of right knee  Muscle weakness (generalized)  Difficulty in walking, not elsewhere classified  Rationale for Evaluation and Treatment: Rehabilitation  ONSET DATE: 5 months  SUBJECTIVE:   SUBJECTIVE STATEMENT: Patient reports no new falls since last session, states that the knees are feeling "pretty decent" today.    PERTINENT HISTORY: High BMI, CHF, CAD, L TKR, degenerative arthritis  PAIN:  Are you having pain? Yes: NPRS scale: 3/10 Pain location: Both knees Pain description: Tightness/achiness Aggravating factors: Cold, extended feet, after periods  of rest Relieving factors: Occasional use of pain medication, seated elliptical Normal Range: 3-6/10  PRECAUTIONS: None  WEIGHT BEARING RESTRICTIONS: No  FALLS:  Has patient fallen in last 6 months? 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, reads, church-sings in choir  PLOF: Independent  PATIENT GOALS: Pt be Ind c better quality of movement  NEXT MD VISIT: Next week  OBJECTIVE:   DIAGNOSTIC FINDINGS:  DG L knee 07/10/22 L knee Narrative: Well-seated prosthesis without complication   PATIENT SURVEYS:  FOTO: Perceived function   47%, predicted   56%   COGNITION: Overall cognitive status: Within functional limits for tasks assessed     SENSATION: WFL  EDEMA:  Swelling of both legs c pitting edema of ankle at sock line  MUSCLE LENGTH: Hamstrings: Right 40 deg; Left 40 deg Thomas test: Right  Tight deg; Left tight deg  POSTURE: rounded shoulders, forward head, and increased thoracic kyphosis  PALPATION: Not overly TTP of the knees  LOWER EXTREMITY ROM:  Active ROM Right eval Left eval  Hip flexion    Hip extension    Hip abduction    Hip adduction    Hip internal rotation    Hip external rotation    Knee flexion 95 100  Knee extension 10 0  Ankle dorsiflexion    Ankle plantarflexion    Ankle inversion    Ankle eversion     (Blank rows = not tested)  LOWER EXTREMITY MMT:  MMT Right eval Left eval  Hip flexion 4- 4-  Hip extension 3 3  Hip abduction 4- 4-  Hip adduction    Hip internal rotation    Hip external rotation 4 4  Knee flexion 4+ 4+  Knee extension 5 5  Ankle dorsiflexion    Ankle plantarflexion    Ankle inversion    Ankle eversion     (Blank rows = not tested)  LOWER EXTREMITY SPECIAL TESTS:    For the R knee: Knee special tests: Anterior drawer test: negative, Posterior drawer test: negative, and McMurray's test: negative  FUNCTIONAL TESTS:  5 times sit to stand: 23.9 2 minute walk test: 370'  Single leg stance: 1" GAIT: Distance walked: 21' Assistive device utilized: None Level of assistance: Complete Independence Comments: Slower pace   TODAY'S TREATMENT:        [facility] Adult PT Treatment:                                                DATE: 08/27/22 Aquatic therapy at [facility]- [address] - therapeutic pool temp approximately 92 degrees. Pt enters building ambulating without AD and aide of another person. Treatment took place in water 3.8 to  4 ft 8 in.feet deep depending upon activity.  Pt entered and exited the pool via stair and handrails independently.  Therapeutic Exercise: Aquatic Exercise: Walking forward/backwards/side stepping Sidestepping with rainbow DB shoulder abd/add x2 laps Shoulder horizontal abduction pink bells x15 Standing with UE support edge of pool: Hip abd/add x20 BIL Hip ext/flex with knee  straight x 20 BIL Hip Circles CC/CCW x10 each BIL Marching hip flexion to knee  extension x20 BIL Hamstring curl x20 BIL Squats 2x20 Heel/toe raises x20 Pt requires the buoyancy of water for active assisted exercises with buoyancy supported for strengthening and AROM exercises. Hydrostatic pressure also supports joints by unweighting joint load by at least 50 % in 3-4 feet depth water. 80% in chest to neck deep water. Water will provide assistance with movement using the current and laminar flow while the buoyancy reduces weight bearing. Pt requires the viscosity of the water for resistance with strengthening exercises.   [facility] Adult PT Treatment:                                                DATE: 08/21/22 Aquatic therapy at [facility]- [address] - therapeutic pool temp approximately 92 degrees. Pt enters building ambulating without AD and aide of another person. Treatment took place in water 3.8 to  4 ft 8 in.feet deep depending upon activity.  Pt entered and exited the pool via stair and handrails independently. Patient entered water for aquatic therapy for first time and was introduced to principles and therapeutic effects of water as they ambulated and acclimated to pool.  Therapeutic Exercise: Aquatic Exercise: Walking forward/backwards/side stepping Standing with UE support edge of pool: Hip abd/add x20 BIL Hip ext/flex with knee straight x 20 BIL Hip Circles CC/CCW x10 each BIL Marching hip flexion to knee extension 2x10 BIL Hamstring curl x20 BIL Squats 2x20 Heel/toe raises x20 Step ups on submerged step x10 BIL Pt requires the buoyancy of water for active assisted exercises with buoyancy supported for strengthening and AROM exercises. Hydrostatic pressure also supports joints by unweighting joint load by at least 50 % in 3-4 feet depth water. 80% in chest to neck deep water. Water will provide assistance with movement using the current and laminar flow while the buoyancy  reduces weight bearing. Pt requires the viscosity of the water for resistance with strengthening exercises.                                                                                                                          [facility] Adult PT Treatment:                                                DATE: 08/12/22 Therapeutic Exercise: Developed, instructed in, and pt completed therex as noted in HEP   PATIENT EDUCATION:  Education details: Eval findings, POC, HEP, self care  Person educated: Patient Education method: Explanation, Demonstration, Tactile cues, Verbal cues, and Handouts Education comprehension: verbalized understanding, returned demonstration, verbal cues required, and tactile cues required  HOME EXERCISE PROGRAM: Access Code: [email or web address] URL: [email or web address] Date: 08/12/2022 Prepared by: Joellyn Rued  Exercises - Active Straight Leg  Raise with Quad Set  - 1 x daily - 7 x weekly - 2 sets - 10 reps - 3 hold - Hooklying Clamshell with Resistance  - 1 x daily - 7 x weekly - 2 sets - 10 reps - 3 hold - Sidelying Hip Abduction  - 1 x daily - 7 x weekly - 2 sets - 10 reps - 3 hold - Sit to Stand with Counter Support  - 1 x daily - 7 x weekly - 2 sets - 10 reps - 3 hold  ASSESSMENT:  CLINICAL IMPRESSION: Patient presents to aquatic PT session reporting no new falls since last session and that she had some soreness after her first aquatic session but that it was not bad. She states that today her knees are not too painful and that she drove to [facility] and back yesterday with no issues. Session today focused on LE strengthening and standing activity tolerance in the aquatic environment for use of buoyancy to offload joints and the viscosity of water as resistance during therapeutic exercise. Patient was able to tolerate all prescribed exercises in the aquatic environment with no adverse effects. Patient continues to benefit from skilled PT services on land and aquatic  based and should be progressed as able to improve functional independence.    OBJECTIVE IMPAIRMENTS: decreased activity tolerance, decreased balance, difficulty walking, decreased ROM, decreased strength, obesity, and pain.   ACTIVITY LIMITATIONS: carrying, lifting, bending, sitting, standing, squatting, stairs, and locomotion level  PARTICIPATION LIMITATIONS: meal prep, cleaning, laundry, community activity, and church  PERSONAL FACTORS: High BMI, CHF, CAD, L TKR, degenerative arthritis are also affecting patient's functional outcome.   REHAB POTENTIAL: Good  CLINICAL DECISION MAKING: Evolving/moderate complexity  EVALUATION COMPLEXITY: Moderate   GOALS:  SHORT TERM GOALS: Target date: 09/04/22 Pt will be Ind in an initial HEP Baseline: started Goal status: INITIAL  LONG TERM GOALS: Target date: 10/16/22  Pt will be Ind in a final HEP to maintain achieved LOF  Baseline: started Goal status: INITIAL  2.  Increase single standing for each LE to greater than 5 sec for improved functional mobility Baseline: 1" each LE Goal status: INITIAL  3.  Increase bilat knee and hip strength by at least 1/2 muscle strength grade for improved functional mobility  Baseline: see flow sheets Goal status: INITIAL  4.  Improve 5xSTS by MCID of 5" and by MCID of 18ft as indication of improved functional mobility  Baseline: 5xSTS c use of hands from bari-chair 23.9". 370" Goal status: INITIAL  5.  Pt's FOTO score will improved to the predicted value of 56% as indication of improved function  Baseline: 47% Goal status: INITIAL PLAN:  PT FREQUENCY: 2x/week  PT DURATION: 8 weeks  PLANNED INTERVENTIONS: Therapeutic exercises, Therapeutic activity, Neuromuscular re-education, Balance training, Gait training, Patient/Family education, Self Care, Joint mobilization, Aquatic Therapy, Dry Needling, Electrical stimulation, Cryotherapy, Moist heat, Taping, Vasopneumatic device, Ultrasound,  Ionotophoresis 4mg /ml Dexamethasone, Manual therapy, and Re-evaluation  PLAN FOR NEXT SESSION: Review FOTO; assess response to HEP; progress therex as indicated; use of modalities, manual therapy; and TPDN as indicated.  Berta Minor PTA 08/28/22 12:17 PM

## 2022-08-28 ENCOUNTER — Ambulatory Visit: Payer: Medicare Other

## 2022-08-28 DIAGNOSIS — M6281 Muscle weakness (generalized): Secondary | ICD-10-CM

## 2022-08-28 DIAGNOSIS — M25562 Pain in left knee: Secondary | ICD-10-CM | POA: Diagnosis not present

## 2022-08-28 DIAGNOSIS — R262 Difficulty in walking, not elsewhere classified: Secondary | ICD-10-CM | POA: Diagnosis not present

## 2022-08-28 DIAGNOSIS — G8929 Other chronic pain: Secondary | ICD-10-CM | POA: Diagnosis not present

## 2022-08-28 DIAGNOSIS — M25561 Pain in right knee: Secondary | ICD-10-CM | POA: Diagnosis not present

## 2022-08-30 ENCOUNTER — Encounter: Payer: Self-pay | Admitting: Urology

## 2022-09-01 NOTE — Therapy (Signed)
OUTPATIENT PHYSICAL THERAPY TREATMENT NOTE   Patient Name: Heather [name] MRN: [id number] DOB:1945-12-21, 77 y.o., female Today's Date: 09/02/2022  END OF SESSION:  PT End of Session - 09/02/22 1235     Visit Number 4    Number of Visits 17    Date for PT Re-Evaluation 10/16/22    Authorization Type MEDICARE PART A AND B    PT Start Time 1230    PT Stop Time 1318    PT Time Calculation (min) 48 min    Activity Tolerance Patient tolerated treatment well    Behavior During Therapy [facility] for tasks assessed/performed                Past Medical History:  Diagnosis Date   Abdominal hernia    Abnormal Pap smear    ALLERGIC RHINITIS 10/22/2006   Allergy    ANEMIA 12/18/2008   Arthritis    scoliosis moderate deg changes lumbar Xray 11/04/17    Asthma    ASYMPTOMATIC POSTMENOPAUSAL STATUS 11/22/2007   Back pain    CAD (coronary artery disease)    in LAD   CHF (congestive heart failure) (HCC)    Complication of anesthesia 2010   pt states she woke up during colonoscopy and was aware of "losing control of breathing" during mammoplasty   Degenerative arthritis    Diverticulosis    Dyslipidemia    Fibroid    Frequent headaches    GERD 07/20/2007   GOITER, MULTINODULAR 07/20/2007   Headache(784.0) 07/20/2007   HEARING LOSS 11/22/2007   Hiatal hernia    Hiatal hernia    large   History of chicken pox    Hx of colposcopy with cervical biopsy    HYPERCHOLESTEROLEMIA 01/13/2007   Hyperglycemia    HYPERTENSION 10/22/2006   Lung nodule    unchanged since 05/26/13    Migraines    NASH (nonalcoholic steatohepatitis)    Nocturnal hypoxemia 02/17/2013   Obesity    Obstructive sleep apnea    OSA on CPAP    per neurology   OSTEOARTHRITIS 10/22/2006   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   Pre-diabetes    Sedimentation rate elevation    Sleep apnea    on cpap   Urine incontinence    UTI (urinary tract infection)    Past Surgical History:  Procedure  Laterality Date   BREAST SURGERY  1984   Breast reduction b/l    CATARACT EXTRACTION, BILATERAL     DEXA  08/2005   DILATION AND CURETTAGE OF UTERUS     ELECTROCARDIOGRAM  10/15/2006   ESOPHAGOGASTRODUODENOSCOPY  12/08/2005   FOOT SURGERY     hammertoe and bunion 05/2017    Stress Cardiolite  10/21/2005   sweat gland removal     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   Procedure: Left TOTAL KNEE ARTHROPLASTY;  Surgeon: Kathryne Hitch, MD;  Location: [facility] OR;  Service: Orthopedics;  Laterality: Left;   UMBILICAL HERNIA REPAIR N/A 04/25/2022   Procedure: OPEN INCARCERATED HERNIA REPAIR UMBILICAL WITH APPENDECTOMY;  Surgeon: Quentin Ore, MD;  Location: [facility] OR;  Service: General;  Laterality: N/A;   WISDOM TOOTH EXTRACTION     Patient Active Problem List   Diagnosis Date Noted   Urge incontinence 08/04/2022   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 06/16/2022   Hypotension 05/26/2022   BMI 34.0-34.9,adult 05/06/2022   Obesity, Beginning BMI 44.60 05/06/2022   SBO (small bowel obstruction) (HCC) 04/25/2022   Obstructive  sleep apnea treated with BiPAP 04/25/2022   Dependence on nocturnal oxygen therapy 04/25/2022   Hyponatremia 04/25/2022   Generalized abdominal pain 04/23/2022   BMI 36.0-36.9,adult 04/08/2022   Obesity, Beginning BMI 44.60 04/08/2022   Persistent cough for 3 weeks or longer 02/13/2022   Type 2 diabetes mellitus with other specified complication (HCC) 12/10/2021   Traumatic tear of supraspinatus tendon of right shoulder 11/24/2021   Right arm pain 11/18/2021   Positive self-administered antigen test for COVID-19 11/09/2021   Stress 10/16/2021   Status post total left knee replacement 03/11/2021   Chronic pain 01/06/2021   Lumbar radiculopathy 07/04/2020   Bilateral sciatica 07/04/2020   Pain in both hands 07/04/2020   Hyperlipidemia associated with type 2 diabetes mellitus (HCC) 05/01/2020   Abnormal blood  level of copper 03/14/2020   Leukopenia 03/14/2020   Obesity (BMI 30-39.9) 11/13/2019   Chronic pain of right thumb 10/11/2019   Vitamin D deficiency 07/27/2019   Memory loss 04/27/2019   Mass of left wrist 01/18/2019   Ventral hernia without obstruction or gangrene 01/18/2019   Mass of right thigh 09/13/2018   Depression 08/02/2018   Sleeps in sitting position due to orthopnea 07/13/2018   BiPAP (biphasic positive airway pressure) dependence 05/24/2018   Goiter 04/15/2017   Urinary incontinence 04/15/2017   Type 2 diabetes mellitus without complication, without long-term current use of insulin (HCC) 02/15/2017   Bilateral leg cramps 01/05/2017   Peripheral neuropathy 05/27/2016   Elevated sed rate 03/05/2016   Muscle spasm of both lower legs 02/12/2016   Leg edema 02/12/2016   Iron deficiency anemia 02/12/2016   Hypokalemia 02/12/2016   Pernicious anemia 02/12/2016   Degenerative disc disease, lumbar 04/09/2015   Degenerative arthritis of knee, bilateral 03/13/2015   Chronic low back pain 03/13/2015   CAD S/P percutaneous coronary angioplasty 10/31/2013   Incidental lung nodule, > 3mm and < 8mm, Left lower lobe 7mm - June 2014, April 2015 unchanged 06/25/2013   OSA on CPAP 06/21/2013   Myalgia and myositis 05/02/2013   Muscle ache 04/03/2013   Class 3 severe obesity with serious comorbidity and body mass index (BMI) of 40.0 to 44.9 in adult (HCC) 01/24/2013   Large hiatal hernia 08/24/2012    LLL nodule 6mm - June 2014, described in 2003 07/13/2012   Asthma, chronic 06/29/2012   Migraine without aura 03/11/2012   SOB (shortness of breath) 12/28/2011   Hx of fibroids 08/03/2011   Knee pain, left 12/25/2010   NECK PAIN 12/03/2009   TINNITUS 11/22/2007   HEARING LOSS 11/22/2007   Chronic pain of both knees 11/22/2007   Gastroesophageal reflux disease without esophagitis 07/20/2007   NASH (nonalcoholic steatohepatitis) 07/20/2007   ANXIETY 10/22/2006   Allergic rhinitis  10/22/2006   Osteoarthritis 10/22/2006    PCP: Pincus Sanes, MD  REFERRING PROVIDER: Judi Saa, DO   REFERRING DIAG: M25.561,M25.562,G89.29 (ICD-10-CM) - Chronic pain of both knees   THERAPY DIAG:  Chronic pain of left knee  Chronic pain of right knee  Muscle weakness (generalized)  Difficulty in walking, not elsewhere classified  Rationale for Evaluation and Treatment: Rehabilitation  ONSET DATE: 5 months  SUBJECTIVE:   SUBJECTIVE STATEMENT: Patient  again reports no new falls since last two session, states that the knees have no pain today.   PERTINENT HISTORY: High BMI, CHF, CAD, L TKR, degenerative arthritis  PAIN:  Are you having pain? Yes: NPRS scale: 3/10 Pain location: Both knees Pain description: Tightness/achiness Aggravating factors: Cold, extended feet,  after periods of rest Relieving factors: Occasional use of pain medication, seated elliptical Normal Range: 3-6/10  PRECAUTIONS: None  WEIGHT BEARING RESTRICTIONS: No  FALLS:  Has patient fallen in last 6 months? 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, reads, church-sings in choir  PLOF: Independent  PATIENT GOALS: Pt be Ind c better quality of movement  NEXT MD VISIT: Next week  OBJECTIVE:   DIAGNOSTIC FINDINGS:  DG L knee 07/10/22 L knee Narrative: Well-seated prosthesis without complication   PATIENT SURVEYS:  FOTO: Perceived function   47%, predicted   56%   COGNITION: Overall cognitive status: Within functional limits for tasks assessed     SENSATION: WFL  EDEMA:  Swelling of both legs c pitting edema of ankle at sock line  MUSCLE LENGTH: Hamstrings: Right 40 deg; Left 40 deg Thomas test: Right Tight  deg; Left tight deg  POSTURE: rounded shoulders, forward head, and increased thoracic kyphosis  PALPATION: Not overly TTP of the knees  LOWER EXTREMITY ROM:  Active ROM Right eval Left eval  Hip flexion    Hip extension    Hip abduction    Hip adduction    Hip internal rotation    Hip external rotation    Knee flexion 95 100  Knee extension 10 0  Ankle dorsiflexion    Ankle plantarflexion    Ankle inversion    Ankle eversion     (Blank rows = not tested)  LOWER EXTREMITY MMT:  MMT Right eval Left eval  Hip flexion 4- 4-  Hip extension 3 3  Hip abduction 4- 4-  Hip adduction    Hip internal rotation    Hip external rotation 4 4  Knee flexion 4+ 4+  Knee extension 5 5  Ankle dorsiflexion    Ankle plantarflexion    Ankle inversion    Ankle eversion     (Blank rows = not tested)  LOWER EXTREMITY SPECIAL TESTS:    For the R knee: Knee special tests: Anterior drawer test: negative, Posterior drawer test: negative, and McMurray's test: negative  FUNCTIONAL TESTS:  5 times sit to stand: 23.9 2 minute walk test: 370'  Single leg stance: 1" GAIT: Distance walked: 370' Assistive device utilized: None Level of assistance: Complete Independence Comments: Slower pace   TODAY'S TREATMENT:    [facility] Adult PT Treatment:                                                DATE: 09-02-22 Aquatic therapy at [facility]- [address] - therapeutic pool temp approximately 92 degrees. Pt enters building ambulating without AD and aide of another person. Treatment took place in water 3.8 to  4 ft 8 in.feet deep depending upon activity.  Pt entered and exited the pool via stair and handrails independently. Pain at initiation of aquatic treatment 0/10  At end 0/10   Aquatic Exercise: Standing with UE support edge of pool: use of aquatic fins for added resistance  Marching hip flexion to knee extension x20 BIL Hip abd/add x20 BIL Hip ext/flex with knee straight x 20 BIL Hip  Circles CC/CCW x10  each BIL Hamstring curl x20 BIL Squats 2x20 Heel/toe raises x 20 Runners stretch x30" BIL Hamstring stretch x30" BIL SL standing for balance without UE support Seated on submerged lift chair with aquatic fins Bicycle kicks x1' Reciprocal kicking and flexion Flutter kicks x1' Scissor kicks x 1'  Walking forward/backwards/side stepping no fins Sidestepping with red bell shoulder abd/add x2 laps no fins  Pt requires the buoyancy of water for active assisted exercises with buoyancy supported for strengthening and AROM exercises. Hydrostatic pressure also supports joints by unweighting joint load by at least 50 % in 3-4 feet depth water. 80% in chest to neck deep water. Water will provide assistance with movement using the current and laminar flow while the buoyancy reduces weight bearing. Pt requires the viscosity of the water for resistance with strengthening exercises.    [facility] Adult PT Treatment:                                                DATE: 08/27/22 Aquatic therapy at [facility]- [address] - therapeutic pool temp approximately 92 degrees. Pt enters building ambulating without AD and aide of another person. Treatment took place in water 3.8 to  4 ft 8 in.feet deep depending upon activity.  Pt entered and exited the pool via stair and handrails independently.  Therapeutic Exercise: Aquatic Exercise: Walking forward/backwards/side stepping Sidestepping with rainbow DB shoulder abd/add x2 laps Shoulder horizontal abduction pink bells x15 Standing with UE support edge of pool: Hip abd/add x20 BIL Hip ext/flex with knee straight x 20 BIL Hip Circles CC/CCW x10 each BIL Marching hip flexion to knee extension x20 BIL Hamstring curl x20 BIL Squats 2x20 Heel/toe raises x20   Pt requires the buoyancy of water for active assisted exercises with buoyancy supported for strengthening and AROM exercises. Hydrostatic pressure also supports joints by unweighting  joint load by at least 50 % in 3-4 feet depth water. 80% in chest to neck deep water. Water will provide assistance with movement using the current and laminar flow while the buoyancy reduces weight bearing. Pt requires the viscosity of the water for resistance with strengthening exercises.   [facility] Adult PT Treatment:                                                DATE: 08/21/22 Aquatic therapy at [facility]- [address] - therapeutic pool temp approximately 92 degrees. Pt enters building ambulating without AD and aide of another person. Treatment took place in water 3.8 to  4 ft 8 in.feet deep depending upon activity.  Pt entered and exited the pool via stair and handrails independently. Patient entered water for aquatic therapy for first time and was introduced to principles and therapeutic effects of water as they ambulated and acclimated to pool.  Therapeutic Exercise: Aquatic Exercise: Walking forward/backwards/side stepping Standing with UE support edge of pool: Hip abd/add x20 BIL Hip ext/flex with knee straight x 20 BIL Hip Circles CC/CCW x10 each BIL Marching hip flexion to knee extension 2x10 BIL Hamstring curl x20 BIL Squats 2x20 Heel/toe raises x20 Step ups on submerged step x10 BIL Pt requires the buoyancy of water for active assisted exercises with buoyancy supported for strengthening and AROM exercises. Hydrostatic  pressure also supports joints by unweighting joint load by at least 50 % in 3-4 feet depth water. 80% in chest to neck deep water. Water will provide assistance with movement using the current and laminar flow while the buoyancy reduces weight bearing. Pt requires the viscosity of the water for resistance with strengthening exercises.                                                                                                                          [facility] Adult PT Treatment:                                                DATE: 08/12/22 Therapeutic  Exercise: Developed, instructed in, and pt completed therex as noted in HEP   PATIENT EDUCATION:  Education details: Eval findings, POC, HEP, self care  Person educated: Patient Education method: Explanation, Demonstration, Tactile cues, Verbal cues, and Handouts Education comprehension: verbalized understanding, returned demonstration, verbal cues required, and tactile cues required  HOME EXERCISE PROGRAM: Access Code: [email or web address] URL: [email or web address] Date: 08/12/2022 Prepared by: Joellyn Rued  Exercises - Active Straight Leg Raise with Quad Set  - 1 x daily - 7 x weekly - 2 sets - 10 reps - 3 hold - Hooklying Clamshell with Resistance  - 1 x daily - 7 x weekly - 2 sets - 10 reps - 3 hold - Sidelying Hip Abduction  - 1 x daily - 7 x weekly - 2 sets - 10 reps - 3 hold - Sit to Stand with Counter Support  - 1 x daily - 7 x weekly - 2 sets - 10 reps - 3 hold  ASSESSMENT:  CLINICAL IMPRESSION: Patient presents to aquatic PT session with no new falls for last 2 sessions. Pt reports no pain today at initiation of RX.  And 0/10 at end of session. Educated pt on community opportunities for aquatic/water exercise after she DC. Session today focused on LE strengthening and standing one limb balance activity  in the aquatic environment for use of buoyancy to offload joints and the viscosity of water as resistance during therapeutic exercise. Added resistance with aquatic fins on LE's Patient was able to tolerate all prescribed exercises in the aquatic environment with no adverse effects. Patient continues to benefit from skilled PT services on land and aquatic based and should be progressed as able to improve functional independence.    OBJECTIVE IMPAIRMENTS: decreased activity tolerance, decreased balance, difficulty walking, decreased ROM, decreased strength, obesity, and pain.   ACTIVITY LIMITATIONS: carrying, lifting, bending, sitting, standing, squatting, stairs, and  locomotion level  PARTICIPATION LIMITATIONS: meal prep, cleaning, laundry, community activity, and church  PERSONAL FACTORS: High BMI, CHF, CAD, L TKR, degenerative arthritis are also affecting patient's functional outcome.   REHAB POTENTIAL: Good  CLINICAL DECISION MAKING: Evolving/moderate complexity  EVALUATION COMPLEXITY: Moderate  GOALS:  SHORT TERM GOALS: Target date: 09/04/22 Pt will be Ind in an initial HEP Baseline: started Goal status: INITIAL  LONG TERM GOALS: Target date: 10/16/22  Pt will be Ind in a final HEP to maintain achieved LOF  Baseline: started Goal status: INITIAL  2.  Increase single standing for each LE to greater than 5 sec for improved functional mobility Baseline: 1" each LE Goal status: INITIAL  3.  Increase bilat knee and hip strength by at least 1/2 muscle strength grade for improved functional mobility  Baseline: see flow sheets Goal status: INITIAL  4.  Improve 5xSTS by MCID of 5" and by MCID of 77ft as indication of improved functional mobility  Baseline: 5xSTS c use of hands from bari-chair 23.9". 370" Goal status: INITIAL  5.  Pt's FOTO score will improved to the predicted value of 56% as indication of improved function  Baseline: 47% Goal status: INITIAL PLAN:  PT FREQUENCY: 2x/week  PT DURATION: 8 weeks  PLANNED INTERVENTIONS: Therapeutic exercises, Therapeutic activity, Neuromuscular re-education, Balance training, Gait training, Patient/Family education, Self Care, Joint mobilization, Aquatic Therapy, Dry Needling, Electrical stimulation, Cryotherapy, Moist heat, Taping, Vasopneumatic device, Ultrasound, Ionotophoresis 4mg /ml Dexamethasone, Manual therapy, and Re-evaluation  PLAN FOR NEXT SESSION: Review FOTO; assess response to HEP; progress therex as indicated; use of modalities, manual therapy; and TPDN as indicated.  Garen Lah, PT, ATRIC Certified Exercise Expert for the Aging Adult  09/02/22 1:20  PM Phone: [phone] Fax: [phone]

## 2022-09-02 ENCOUNTER — Ambulatory Visit: Payer: Medicare Other | Attending: Family Medicine | Admitting: Physical Therapy

## 2022-09-02 ENCOUNTER — Encounter: Payer: Self-pay | Admitting: Physical Therapy

## 2022-09-02 DIAGNOSIS — M25561 Pain in right knee: Secondary | ICD-10-CM | POA: Insufficient documentation

## 2022-09-02 DIAGNOSIS — M6281 Muscle weakness (generalized): Secondary | ICD-10-CM | POA: Insufficient documentation

## 2022-09-02 DIAGNOSIS — G8929 Other chronic pain: Secondary | ICD-10-CM | POA: Insufficient documentation

## 2022-09-02 DIAGNOSIS — M25562 Pain in left knee: Secondary | ICD-10-CM | POA: Insufficient documentation

## 2022-09-02 DIAGNOSIS — R262 Difficulty in walking, not elsewhere classified: Secondary | ICD-10-CM | POA: Insufficient documentation

## 2022-09-08 NOTE — Therapy (Signed)
OUTPATIENT PHYSICAL THERAPY TREATMENT NOTE   Patient Name: Heather [name] MRN: [id number] DOB:[birth year], 77 y.o., female Today's Date: 09/08/2022  END OF SESSION:       Past Medical History:  Diagnosis Date   Abdominal hernia    Abnormal Pap smear    ALLERGIC RHINITIS 10/22/2006   Allergy    ANEMIA 12/18/2008   Arthritis    scoliosis moderate deg changes lumbar Xray 11/04/17    Asthma    ASYMPTOMATIC POSTMENOPAUSAL STATUS 11/22/2007   Back pain    CAD (coronary artery disease)    in LAD   CHF (congestive heart failure) (HCC)    Complication of anesthesia 2010   pt states she woke up during colonoscopy and was aware of "losing control of breathing" during mammoplasty   Degenerative arthritis    Diverticulosis    Dyslipidemia    Fibroid    Frequent headaches    GERD 07/20/2007   GOITER, MULTINODULAR 07/20/2007   Headache(784.0) 07/20/2007   HEARING LOSS 11/22/2007   Hiatal hernia    Hiatal hernia    large   History of chicken pox    Hx of colposcopy with cervical biopsy    HYPERCHOLESTEROLEMIA 01/13/2007   Hyperglycemia    HYPERTENSION 10/22/2006   Lung nodule    unchanged since 05/26/13    Migraines    NASH (nonalcoholic steatohepatitis)    Nocturnal hypoxemia 02/17/2013   Obesity    Obstructive sleep apnea    OSA on CPAP    per neurology   OSTEOARTHRITIS 10/22/2006   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   Pre-diabetes    Sedimentation rate elevation    Sleep apnea    on cpap   Urine incontinence    UTI (urinary tract infection)    Past Surgical History:  Procedure Laterality Date   BREAST SURGERY  1984   Breast reduction b/l    CATARACT EXTRACTION, BILATERAL     DEXA  08/2005   DILATION AND CURETTAGE OF UTERUS     ELECTROCARDIOGRAM  10/15/2006   ESOPHAGOGASTRODUODENOSCOPY  12/08/2005   FOOT SURGERY     hammertoe and bunion 05/2017    Stress Cardiolite  10/21/2005   sweat gland removal     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    Procedure: Left TOTAL KNEE ARTHROPLASTY;  Surgeon: Kathryne Hitch, MD;  Location: [facility] OR;  Service: Orthopedics;  Laterality: Left;   UMBILICAL HERNIA REPAIR N/A 04/25/2022   Procedure: OPEN INCARCERATED HERNIA REPAIR UMBILICAL WITH APPENDECTOMY;  Surgeon: Quentin Ore, MD;  Location: [facility] OR;  Service: General;  Laterality: N/A;   WISDOM TOOTH EXTRACTION     Patient Active Problem List   Diagnosis Date Noted   Urge incontinence 08/04/2022   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 06/16/2022   Hypotension 05/26/2022   BMI 34.0-34.9,adult 05/06/2022   Obesity, Beginning BMI 44.60 05/06/2022   SBO (small bowel obstruction) (HCC) 04/25/2022   Obstructive sleep apnea treated with BiPAP 04/25/2022   Dependence on nocturnal oxygen therapy 04/25/2022   Hyponatremia 04/25/2022   Generalized abdominal pain 04/23/2022   BMI 36.0-36.9,adult 04/08/2022   Obesity, Beginning BMI 44.60 04/08/2022   Persistent cough for 3 weeks or longer 02/13/2022   Type 2 diabetes mellitus with other specified complication (HCC) 12/10/2021   Traumatic tear of supraspinatus tendon of right shoulder 11/24/2021   Right arm pain 11/18/2021   Positive self-administered antigen test for COVID-19 11/09/2021   Stress 10/16/2021   Status  post total left knee replacement 03/11/2021   Chronic pain 01/06/2021   Lumbar radiculopathy 07/04/2020   Bilateral sciatica 07/04/2020   Pain in both hands 07/04/2020   Hyperlipidemia associated with type 2 diabetes mellitus (HCC) 05/01/2020   Abnormal blood level of copper 03/14/2020   Leukopenia 03/14/2020   Obesity (BMI 30-39.9) 11/13/2019   Chronic pain of right thumb 10/11/2019   Vitamin D deficiency 07/27/2019   Memory loss 04/27/2019   Mass of left wrist 01/18/2019   Ventral hernia without obstruction or gangrene 01/18/2019   Mass of right thigh 09/13/2018   Depression 08/02/2018   Sleeps in sitting position due  to orthopnea 07/13/2018   BiPAP (biphasic positive airway pressure) dependence 05/24/2018   Goiter 04/15/2017   Urinary incontinence 04/15/2017   Type 2 diabetes mellitus without complication, without long-term current use of insulin (HCC) 02/15/2017   Bilateral leg cramps 01/05/2017   Peripheral neuropathy 05/27/2016   Elevated sed rate 03/05/2016   Muscle spasm of both lower legs 02/12/2016   Leg edema 02/12/2016   Iron deficiency anemia 02/12/2016   Hypokalemia 02/12/2016   Pernicious anemia 02/12/2016   Degenerative disc disease, lumbar 04/09/2015   Degenerative arthritis of knee, bilateral 03/13/2015   Chronic low back pain 03/13/2015   CAD S/P percutaneous coronary angioplasty 10/31/2013   Incidental lung nodule, > 3mm and < 8mm, Left lower lobe 7mm - June 2014, April 2015 unchanged 06/25/2013   OSA on CPAP 06/21/2013   Myalgia and myositis 05/02/2013   Muscle ache 04/03/2013   Class 3 severe obesity with serious comorbidity and body mass index (BMI) of 40.0 to 44.9 in adult (HCC) 01/24/2013   Large hiatal hernia 08/24/2012    LLL nodule 6mm - June 2014, described in 2003 07/13/2012   Asthma, chronic 06/29/2012   Migraine without aura 03/11/2012   SOB (shortness of breath) 12/28/2011   Hx of fibroids 08/03/2011   Knee pain, left 12/25/2010   NECK PAIN 12/03/2009   TINNITUS 11/22/2007   HEARING LOSS 11/22/2007   Chronic pain of both knees 11/22/2007   Gastroesophageal reflux disease without esophagitis 07/20/2007   NASH (nonalcoholic steatohepatitis) 07/20/2007   ANXIETY 10/22/2006   Allergic rhinitis 10/22/2006   Osteoarthritis 10/22/2006    PCP: Pincus Sanes, MD  REFERRING PROVIDER: Judi Saa, DO   REFERRING DIAG: M25.561,M25.562,G89.29 (ICD-10-CM) - Chronic pain of both knees   THERAPY DIAG:  No diagnosis found.  Rationale for Evaluation and Treatment: Rehabilitation  ONSET DATE: 5 months  SUBJECTIVE:   SUBJECTIVE STATEMENT: Patient  again  reports no new falls since last two session, states that the knees have no pain today.   PERTINENT HISTORY: High BMI, CHF, CAD, L TKR, degenerative arthritis  PAIN:  Are you having pain? Yes: NPRS scale: 3/10 Pain location: Both knees Pain description: Tightness/achiness Aggravating factors: Cold, extended feet, after periods of rest Relieving factors: Occasional use of pain medication, seated elliptical Normal Range: 3-6/10  PRECAUTIONS: None  WEIGHT BEARING RESTRICTIONS: No  FALLS:  Has patient fallen in last 6 months? 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, reads, church-sings in choir  PLOF: Independent  PATIENT GOALS: Pt be Ind c better quality of movement  NEXT MD VISIT: Next week  OBJECTIVE:  DIAGNOSTIC FINDINGS:  DG L knee 07/10/22 L knee Narrative: Well-seated prosthesis without complication   PATIENT SURVEYS:  FOTO: Perceived function   47%, predicted   56%   COGNITION: Overall cognitive status: Within functional limits for tasks assessed     SENSATION: WFL  EDEMA:  Swelling of both legs c pitting edema of ankle at sock line  MUSCLE LENGTH: Hamstrings: Right 40 deg; Left 40 deg Thomas test: Right Tight deg; Left tight deg  POSTURE: rounded shoulders, forward head, and increased thoracic kyphosis  PALPATION: Not overly TTP of the knees  LOWER EXTREMITY ROM:  Active ROM Right eval Left eval  Hip flexion    Hip extension    Hip abduction    Hip adduction    Hip internal rotation    Hip external rotation    Knee flexion 95 100  Knee extension 10 0  Ankle dorsiflexion    Ankle plantarflexion    Ankle inversion    Ankle eversion     (Blank rows = not tested)  LOWER  EXTREMITY MMT:  MMT Right eval Left eval  Hip flexion 4- 4-  Hip extension 3 3  Hip abduction 4- 4-  Hip adduction    Hip internal rotation    Hip external rotation 4 4  Knee flexion 4+ 4+  Knee extension 5 5  Ankle dorsiflexion    Ankle plantarflexion    Ankle inversion    Ankle eversion     (Blank rows = not tested)  LOWER EXTREMITY SPECIAL TESTS:    For the R knee: Knee special tests: Anterior drawer test: negative, Posterior drawer test: negative, and McMurray's test: negative  FUNCTIONAL TESTS:  5 times sit to stand: 23.9 2 minute walk test: 370'  Single leg stance: 1" GAIT: Distance walked: 50' Assistive device utilized: None Level of assistance: Complete Independence Comments: Slower pace   TODAY'S TREATMENT:  [facility] Adult PT Treatment:                                                DATE: 09-09-22        [facility] Adult PT Treatment:                                                DATE: 09-02-22 Aquatic therapy at [facility] [address] - therapeutic pool temp approximately 92 degrees. Pt enters building ambulating without AD and aide of another person. Treatment took place in water 3.8 to  4 ft 8 in.feet deep depending upon activity.  Pt entered and exited the pool via stair and handrails independently. Pain at initiation of aquatic treatment 0/10  At end 0/10   Aquatic Exercise: Standing with UE support edge of pool: use of aquatic fins for added resistance  Marching hip flexion to knee extension x20 BIL Hip abd/add x20 BIL Hip ext/flex with knee straight x 20 BIL Hip Circles CC/CCW x10 each BIL Hamstring curl x20 BIL Squats 2x20 Heel/toe raises x 20 Runners stretch x30" BIL Hamstring stretch x30" BIL SL standing for balance without UE support Seated on submerged lift chair with aquatic fins Bicycle kicks x1' Reciprocal kicking and flexion Flutter kicks x1' Scissor kicks x 1'  Walking forward/backwards/side stepping no  fins Sidestepping with  red bell shoulder abd/add x2 laps no fins  Pt requires the buoyancy of water for active assisted exercises with buoyancy supported for strengthening and AROM exercises. Hydrostatic pressure also supports joints by unweighting joint load by at least 50 % in 3-4 feet depth water. 80% in chest to neck deep water. Water will provide assistance with movement using the current and laminar flow while the buoyancy reduces weight bearing. Pt requires the viscosity of the water for resistance with strengthening exercises.    [facility] Adult PT Treatment:                                                DATE: 08/27/22 Aquatic therapy at [facility] [address] - therapeutic pool temp approximately 92 degrees. Pt enters building ambulating without AD and aide of another person. Treatment took place in water 3.8 to  4 ft 8 in.feet deep depending upon activity.  Pt entered and exited the pool via stair and handrails independently.  Therapeutic Exercise: Aquatic Exercise: Walking forward/backwards/side stepping Sidestepping with rainbow DB shoulder abd/add x2 laps Shoulder horizontal abduction pink bells x15 Standing with UE support edge of pool: Hip abd/add x20 BIL Hip ext/flex with knee straight x 20 BIL Hip Circles CC/CCW x10 each BIL Marching hip flexion to knee extension x20 BIL Hamstring curl x20 BIL Squats 2x20 Heel/toe raises x20   Pt requires the buoyancy of water for active assisted exercises with buoyancy supported for strengthening and AROM exercises. Hydrostatic pressure also supports joints by unweighting joint load by at least 50 % in 3-4 feet depth water. 80% in chest to neck deep water. Water will provide assistance with movement using the current and laminar flow while the buoyancy reduces weight bearing. Pt requires the viscosity of the water for resistance with strengthening exercises.   [facility] Adult PT Treatment:                                                DATE: 08/21/22 Aquatic  therapy at [facility]- [address] - therapeutic pool temp approximately 92 degrees. Pt enters building ambulating without AD and aide of another person. Treatment took place in water 3.8 to  4 ft 8 in.feet deep depending upon activity.  Pt entered and exited the pool via stair and handrails independently. Patient entered water for aquatic therapy for first time and was introduced to principles and therapeutic effects of water as they ambulated and acclimated to pool.  Therapeutic Exercise: Aquatic Exercise: Walking forward/backwards/side stepping Standing with UE support edge of pool: Hip abd/add x20 BIL Hip ext/flex with knee straight x 20 BIL Hip Circles CC/CCW x10 each BIL Marching hip flexion to knee extension 2x10 BIL Hamstring curl x20 BIL Squats 2x20 Heel/toe raises x20 Step ups on submerged step x10 BIL Pt requires the buoyancy of water for active assisted exercises with buoyancy supported for strengthening and AROM exercises. Hydrostatic pressure also supports joints by unweighting joint load by at least 50 % in 3-4 feet depth water. 80% in chest to neck deep water. Water will provide assistance with movement using the current and laminar flow while the buoyancy reduces weight bearing. Pt requires the viscosity of the water for resistance with  strengthening exercises.                                                                                                                          [facility] Adult PT Treatment:                                                DATE: 08/12/22 Therapeutic Exercise: Developed, instructed in, and pt completed therex as noted in HEP   PATIENT EDUCATION:  Education details: Eval findings, POC, HEP, self care  Person educated: Patient Education method: Explanation, Demonstration, Tactile cues, Verbal cues, and Handouts Education comprehension: verbalized understanding, returned demonstration, verbal cues required, and tactile cues required  HOME  EXERCISE PROGRAM: Access Code: [email or web address] URL: [email or web address] Date: 08/12/2022 Prepared by: Joellyn Rued  Exercises - Active Straight Leg Raise with Quad Set  - 1 x daily - 7 x weekly - 2 sets - 10 reps - 3 hold - Hooklying Clamshell with Resistance  - 1 x daily - 7 x weekly - 2 sets - 10 reps - 3 hold - Sidelying Hip Abduction  - 1 x daily - 7 x weekly - 2 sets - 10 reps - 3 hold - Sit to Stand with Counter Support  - 1 x daily - 7 x weekly - 2 sets - 10 reps - 3 hold  ASSESSMENT:  CLINICAL IMPRESSION: Patient presents to aquatic PT session with no new falls for last 2 sessions. Pt reports no pain today at initiation of RX.  And 0/10 at end of session. Educated pt on community opportunities for aquatic/water exercise after she DC. Session today focused on LE strengthening and standing one limb balance activity  in the aquatic environment for use of buoyancy to offload joints and the viscosity of water as resistance during therapeutic exercise. Added resistance with aquatic fins on LE's Patient was able to tolerate all prescribed exercises in the aquatic environment with no adverse effects. Patient continues to benefit from skilled PT services on land and aquatic based and should be progressed as able to improve functional independence.    OBJECTIVE IMPAIRMENTS: decreased activity tolerance, decreased balance, difficulty walking, decreased ROM, decreased strength, obesity, and pain.   ACTIVITY LIMITATIONS: carrying, lifting, bending, sitting, standing, squatting, stairs, and locomotion level  PARTICIPATION LIMITATIONS: meal prep, cleaning, laundry, community activity, and church  PERSONAL FACTORS: High BMI, CHF, CAD, L TKR, degenerative arthritis are also affecting patient's functional outcome.   REHAB POTENTIAL: Good  CLINICAL DECISION MAKING: Evolving/moderate complexity  EVALUATION COMPLEXITY: Moderate   GOALS:  SHORT TERM GOALS: Target date: 09/04/22 Pt will  be Ind in an initial HEP Baseline: started Goal status: INITIAL  LONG TERM GOALS: Target date: 10/16/22  Pt will be Ind in a final HEP to maintain achieved LOF  Baseline: started Goal status: INITIAL  2.  Increase single  standing for each LE to greater than 5 sec for improved functional mobility Baseline: 1" each LE Goal status: INITIAL  3.  Increase bilat knee and hip strength by at least 1/2 muscle strength grade for improved functional mobility  Baseline: see flow sheets Goal status: INITIAL  4.  Improve 5xSTS by MCID of 5" and by MCID of 97ft as indication of improved functional mobility  Baseline: 5xSTS c use of hands from bari-chair 23.9". 370" Goal status: INITIAL  5.  Pt's FOTO score will improved to the predicted value of 56% as indication of improved function  Baseline: 47% Goal status: INITIAL PLAN:  PT FREQUENCY: 2x/week  PT DURATION: 8 weeks  PLANNED INTERVENTIONS: Therapeutic exercises, Therapeutic activity, Neuromuscular re-education, Balance training, Gait training, Patient/Family education, Self Care, Joint mobilization, Aquatic Therapy, Dry Needling, Electrical stimulation, Cryotherapy, Moist heat, Taping, Vasopneumatic device, Ultrasound, Ionotophoresis 4mg /ml Dexamethasone, Manual therapy, and Re-evaluation  PLAN FOR NEXT SESSION: Review FOTO; assess response to HEP; progress therex as indicated; use of modalities, manual therapy; and TPDN as indicated.  ***

## 2022-09-09 ENCOUNTER — Encounter: Payer: Self-pay | Admitting: Physical Therapy

## 2022-09-09 ENCOUNTER — Ambulatory Visit: Payer: Medicare Other | Admitting: Physical Therapy

## 2022-09-09 DIAGNOSIS — M6281 Muscle weakness (generalized): Secondary | ICD-10-CM

## 2022-09-09 DIAGNOSIS — M25562 Pain in left knee: Secondary | ICD-10-CM

## 2022-09-09 DIAGNOSIS — G8929 Other chronic pain: Secondary | ICD-10-CM

## 2022-09-09 DIAGNOSIS — R262 Difficulty in walking, not elsewhere classified: Secondary | ICD-10-CM

## 2022-09-09 DIAGNOSIS — M25561 Pain in right knee: Secondary | ICD-10-CM | POA: Diagnosis not present

## 2022-09-10 ENCOUNTER — Encounter: Payer: Self-pay | Admitting: Physical Therapy

## 2022-09-10 ENCOUNTER — Ambulatory Visit: Payer: Medicare Other | Admitting: Physical Therapy

## 2022-09-10 DIAGNOSIS — G8929 Other chronic pain: Secondary | ICD-10-CM

## 2022-09-10 DIAGNOSIS — R262 Difficulty in walking, not elsewhere classified: Secondary | ICD-10-CM | POA: Diagnosis not present

## 2022-09-10 DIAGNOSIS — M25561 Pain in right knee: Secondary | ICD-10-CM | POA: Diagnosis not present

## 2022-09-10 DIAGNOSIS — M6281 Muscle weakness (generalized): Secondary | ICD-10-CM

## 2022-09-10 DIAGNOSIS — M25562 Pain in left knee: Secondary | ICD-10-CM | POA: Diagnosis not present

## 2022-09-10 NOTE — Therapy (Signed)
OUTPATIENT PHYSICAL THERAPY TREATMENT NOTE   Patient Name: BINTOU LAFATA MRN: [id number] DOB:1945/12/01, 77 y.o., female Today's Date: 09/10/2022  END OF SESSION:  PT End of Session - 09/10/22 1358     Visit Number 6    Number of Visits 17    Date for PT Re-Evaluation 10/16/22    Authorization Type MEDICARE PART A AND B    PT Start Time 1400    PT Stop Time 1442    PT Time Calculation (min) 42 min    Activity Tolerance Patient tolerated treatment well    Behavior During Therapy [facility] for tasks assessed/performed                  Past Medical History:  Diagnosis Date   Abdominal hernia    Abnormal Pap smear    ALLERGIC RHINITIS 10/22/2006   Allergy    ANEMIA 12/18/2008   Arthritis    scoliosis moderate deg changes lumbar Xray 11/04/17    Asthma    ASYMPTOMATIC POSTMENOPAUSAL STATUS 11/22/2007   Back pain    CAD (coronary artery disease)    in LAD   CHF (congestive heart failure) (HCC)    Complication of anesthesia 2010   pt states she woke up during colonoscopy and was aware of "losing control of breathing" during mammoplasty   Degenerative arthritis    Diverticulosis    Dyslipidemia    Fibroid    Frequent headaches    GERD 07/20/2007   GOITER, MULTINODULAR 07/20/2007   Headache(784.0) 07/20/2007   HEARING LOSS 11/22/2007   Hiatal hernia    Hiatal hernia    large   History of chicken pox    Hx of colposcopy with cervical biopsy    HYPERCHOLESTEROLEMIA 01/13/2007   Hyperglycemia    HYPERTENSION 10/22/2006   Lung nodule    unchanged since 05/26/13    Migraines    NASH (nonalcoholic steatohepatitis)    Nocturnal hypoxemia 02/17/2013   Obesity    Obstructive sleep apnea    OSA on CPAP    per neurology   OSTEOARTHRITIS 10/22/2006   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   Pre-diabetes    Sedimentation rate elevation    Sleep apnea    on cpap   Urine incontinence    UTI (urinary tract infection)    Past Surgical History:   Procedure Laterality Date   BREAST SURGERY  1984   Breast reduction b/l    CATARACT EXTRACTION, BILATERAL     DEXA  08/2005   DILATION AND CURETTAGE OF UTERUS     ELECTROCARDIOGRAM  10/15/2006   ESOPHAGOGASTRODUODENOSCOPY  12/08/2005   FOOT SURGERY     hammertoe and bunion 05/2017    Stress Cardiolite  10/21/2005   sweat gland removal     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   Procedure: Left TOTAL KNEE ARTHROPLASTY;  Surgeon: Kathryne Hitch, MD;  Location: [facility] OR;  Service: Orthopedics;  Laterality: Left;   UMBILICAL HERNIA REPAIR N/A 04/25/2022   Procedure: OPEN INCARCERATED HERNIA REPAIR UMBILICAL WITH APPENDECTOMY;  Surgeon: Quentin Ore, MD;  Location: [facility] OR;  Service: General;  Laterality: N/A;   WISDOM TOOTH EXTRACTION     Patient Active Problem List   Diagnosis Date Noted   Urge incontinence 08/04/2022   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 06/16/2022   Hypotension 05/26/2022   BMI 34.0-34.9,adult 05/06/2022   Obesity, Beginning BMI 44.60 05/06/2022   SBO (small bowel obstruction) (HCC) 04/25/2022  Obstructive sleep apnea treated with BiPAP 04/25/2022   Dependence on nocturnal oxygen therapy 04/25/2022   Hyponatremia 04/25/2022   Generalized abdominal pain 04/23/2022   BMI 36.0-36.9,adult 04/08/2022   Obesity, Beginning BMI 44.60 04/08/2022   Persistent cough for 3 weeks or longer 02/13/2022   Type 2 diabetes mellitus with other specified complication (HCC) 12/10/2021   Traumatic tear of supraspinatus tendon of right shoulder 11/24/2021   Right arm pain 11/18/2021   Positive self-administered antigen test for COVID-19 11/09/2021   Stress 10/16/2021   Status post total left knee replacement 03/11/2021   Chronic pain 01/06/2021   Lumbar radiculopathy 07/04/2020   Bilateral sciatica 07/04/2020   Pain in both hands 07/04/2020   Hyperlipidemia associated with type 2 diabetes mellitus (HCC) 05/01/2020    Abnormal blood level of copper 03/14/2020   Leukopenia 03/14/2020   Obesity (BMI 30-39.9) 11/13/2019   Chronic pain of right thumb 10/11/2019   Vitamin D deficiency 07/27/2019   Memory loss 04/27/2019   Mass of left wrist 01/18/2019   Ventral hernia without obstruction or gangrene 01/18/2019   Mass of right thigh 09/13/2018   Depression 08/02/2018   Sleeps in sitting position due to orthopnea 07/13/2018   BiPAP (biphasic positive airway pressure) dependence 05/24/2018   Goiter 04/15/2017   Urinary incontinence 04/15/2017   Type 2 diabetes mellitus without complication, without long-term current use of insulin (HCC) 02/15/2017   Bilateral leg cramps 01/05/2017   Peripheral neuropathy 05/27/2016   Elevated sed rate 03/05/2016   Muscle spasm of both lower legs 02/12/2016   Leg edema 02/12/2016   Iron deficiency anemia 02/12/2016   Hypokalemia 02/12/2016   Pernicious anemia 02/12/2016   Degenerative disc disease, lumbar 04/09/2015   Degenerative arthritis of knee, bilateral 03/13/2015   Chronic low back pain 03/13/2015   CAD S/P percutaneous coronary angioplasty 10/31/2013   Incidental lung nodule, > 3mm and < 8mm, Left lower lobe 7mm - June 2014, April 2015 unchanged 06/25/2013   OSA on CPAP 06/21/2013   Myalgia and myositis 05/02/2013   Muscle ache 04/03/2013   Class 3 severe obesity with serious comorbidity and body mass index (BMI) of 40.0 to 44.9 in adult (HCC) 01/24/2013   Large hiatal hernia 08/24/2012    LLL nodule 6mm - June 2014, described in 2003 07/13/2012   Asthma, chronic 06/29/2012   Migraine without aura 03/11/2012   SOB (shortness of breath) 12/28/2011   Hx of fibroids 08/03/2011   Knee pain, left 12/25/2010   NECK PAIN 12/03/2009   TINNITUS 11/22/2007   HEARING LOSS 11/22/2007   Chronic pain of both knees 11/22/2007   Gastroesophageal reflux disease without esophagitis 07/20/2007   NASH (nonalcoholic steatohepatitis) 07/20/2007   ANXIETY 10/22/2006    Allergic rhinitis 10/22/2006   Osteoarthritis 10/22/2006    PCP: Pincus Sanes, MD  REFERRING PROVIDER: Judi Saa, DO   REFERRING DIAG: M25.561,M25.562,G89.29 (ICD-10-CM) - Chronic pain of both knees   THERAPY DIAG:  Chronic pain of left knee  Chronic pain of right knee  Muscle weakness (generalized)  Difficulty in walking, not elsewhere classified  Rationale for Evaluation and Treatment: Rehabilitation  ONSET DATE: 5 months  SUBJECTIVE:   SUBJECTIVE STATEMENT: Pt reports that she I a little tighter today in her back and knees.  She rates her pain at 3-4/10.   PERTINENT HISTORY: High BMI, CHF, CAD, L TKR, degenerative arthritis  PAIN:  Are you having pain? Yes: NPRS scale: 3/10 Pain location: Both knees Pain description: Tightness/achiness Aggravating factors:  Cold, extended feet, after periods of rest Relieving factors: Occasional use of pain medication, seated elliptical Normal Range: 3-6/10  PRECAUTIONS: None  WEIGHT BEARING RESTRICTIONS: No  FALLS:  Has patient fallen in last 6 months? 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, reads, church-sings in choir  PLOF: Independent  PATIENT GOALS: Pt be Ind c better quality of movement  NEXT MD VISIT: Next week  OBJECTIVE:   DIAGNOSTIC FINDINGS:  DG L knee 07/10/22 L knee Narrative: Well-seated prosthesis without complication   PATIENT SURVEYS:  FOTO: Perceived function   47%, predicted   56%   COGNITION: Overall cognitive status: Within functional limits for tasks assessed     SENSATION: WFL  EDEMA:  Swelling of both legs c pitting edema of ankle at sock line  MUSCLE LENGTH: Hamstrings: Right 40 deg; Left 40 deg Thomas test:  Right Tight deg; Left tight deg  POSTURE: rounded shoulders, forward head, and increased thoracic kyphosis  PALPATION: Not overly TTP of the knees  LOWER EXTREMITY ROM:  Active ROM Right eval Left eval  Hip flexion    Hip extension    Hip abduction    Hip adduction    Hip internal rotation    Hip external rotation    Knee flexion 95 100  Knee extension 10 0  Ankle dorsiflexion    Ankle plantarflexion    Ankle inversion    Ankle eversion     (Blank rows = not tested)  LOWER EXTREMITY MMT:  MMT Right eval Left eval  Hip flexion 4- 4-  Hip extension 3 3  Hip abduction 4- 4-  Hip adduction    Hip internal rotation    Hip external rotation 4 4  Knee flexion 4+ 4+  Knee extension 5 5  Ankle dorsiflexion    Ankle plantarflexion    Ankle inversion    Ankle eversion     (Blank rows = not tested)  LOWER EXTREMITY SPECIAL TESTS:    For the R knee: Knee special tests: Anterior drawer test: negative, Posterior drawer test: negative, and McMurray's test: negative  FUNCTIONAL TESTS:  5 times sit to stand: 23.9 2 minute walk test: 370'  Single leg stance: 1" GAIT: Distance walked: 29' Assistive device utilized: None Level of assistance: Complete Independence Comments: Slower pace      TODAY'S TREATMENT:   [facility] Adult PT Treatment:                                                DATE: 09-10-22  Aquatic therapy at [facility] [address] - therapeutic pool temp approximately 92 degrees. Pt enters building ambulating without AD and aide of another person. Treatment took place in water 3.8 to  4 ft 8 in.feet deep depending upon activity.  Pt entered and exited the pool via stair and handrails independently. Pain at initiation of aquatic treatment 2/10  At end 0/10   Aquatic Exercise: Standing with UE support edge of pool: use of aquatic fins for added resistance  Fwd/lateral/bkwds - 2 laps ea Marching hip flexion to knee extension x20 BIL Heel/toe raises x  20 Ham  curls x 20 Squats 2x20 Hip abd/add x20 BIL Hip ext/flex with knee straight x 20 BIL Walking lunge - 2 laps with rainbow DB L stretch with hip ext - 20x Hip Circles CC/CCW x10 each BIL Hamstring curl x20 BIL Yellow DB push down - 20x with abdominal bracing Gentle rotation with yellow dumbells - 20x Marching/jogging in place with CGA x 1  Hopping in place bil with CGA x 1   Pt requires the buoyancy of water for active assisted exercises with buoyancy supported for strengthening and AROM exercises. Hydrostatic pressure also supports joints by unweighting joint load by at least 50 % in 3-4 feet depth water. 80% in chest to neck deep water. Water will provide assistance with movement using the current and laminar flow while the buoyancy reduces weight bearing. Pt requires the viscosity of the water for resistance with strengthening exercises.  [facility] Adult PT Treatment:                                                DATE: 09-09-22  Aquatic therapy at [facility] [address] - therapeutic pool temp approximately 92 degrees. Pt enters building ambulating without AD and aide of another person. Treatment took place in water 3.8 to  4 ft 8 in.feet deep depending upon activity.  Pt entered and exited the pool via stair and handrails independently. Pain at initiation of aquatic treatment 2/10  At end 0/10   Aquatic Exercise: Standing with UE support edge of pool: use of aquatic fins for added resistance  Marching hip flexion to knee extension x20 BIL Heel/toe raises x 20 Ham curls x 20 Squats 2x20 Hip abd/add x20 BIL Hip ext/flex with knee straight x 20 BIL Lunge to Target 1 x 10 R and L Hip Circles CC/CCW x10 each BIL Hamstring curl x20 BIL Runners stretch x30" BIL Hamstring stretch x30" BIL SL standing for balance without UE support Side stepping on pool noodle for balance Aqua Stretch for bil quads and PT assisted stretch for quads Marching/jogging in place with CGA x 1   Hopping in place bil with CGA x 1  Pt requires the buoyancy of water for active assisted exercises with buoyancy supported for strengthening and AROM exercises. Hydrostatic pressure also supports joints by unweighting joint load by at least 50 % in 3-4 feet depth water. 80% in chest to neck deep water. Water will provide assistance with movement using the current and laminar flow while the buoyancy reduces weight bearing. Pt requires the viscosity of the water for resistance with strengthening exercises.  [facility] Adult PT Treatment:                                                DATE: 09-02-22 Aquatic therapy at [facility] [address] - therapeutic pool temp approximately 92 degrees. Pt enters building ambulating without AD and aide of another person. Treatment took place in water 3.8 to  4 ft 8 in.feet deep depending upon activity.  Pt entered and exited the pool via stair and handrails independently. Pain at initiation of aquatic treatment 0/10  At end 0/10   Aquatic Exercise: Standing with UE support edge of pool: use of aquatic fins for added resistance  Marching  hip flexion to knee extension x20 BIL Hip abd/add x20 BIL Hip ext/flex with knee straight x 20 BIL Hip Circles CC/CCW x10 each BIL Hamstring curl x20 BIL Squats 2x20 Heel/toe raises x 20 Runners stretch x30" BIL Hamstring stretch x30" BIL SL standing for balance without UE support Seated on submerged lift chair with aquatic fins Bicycle kicks x1' Reciprocal kicking and flexion Flutter kicks x1' Scissor kicks x 1'  Walking forward/backwards/side stepping no fins Sidestepping with red bell shoulder abd/add x2 laps no fins  Pt requires the buoyancy of water for active assisted exercises with buoyancy supported for strengthening and AROM exercises. Hydrostatic pressure also supports joints by unweighting joint load by at least 50 % in 3-4 feet depth water. 80% in chest to neck deep water. Water will provide assistance  with movement using the current and laminar flow while the buoyancy reduces weight bearing. Pt requires the viscosity of the water for resistance with strengthening exercises.  [facility] Adult PT Treatment:                                                DATE: 08/27/22 Aquatic therapy at [facility] [address] - therapeutic pool temp approximately 92 degrees. Pt enters building ambulating without AD and aide of another person. Treatment took place in water 3.8 to  4 ft 8 in.feet deep depending upon activity.  Pt entered and exited the pool via stair and handrails independently.  Therapeutic Exercise: Aquatic Exercise: Walking forward/backwards/side stepping Sidestepping with rainbow DB shoulder abd/add x2 laps Shoulder horizontal abduction pink bells x15 Standing with UE support edge of pool: Hip abd/add x20 BIL Hip ext/flex with knee straight x 20 BIL Hip Circles CC/CCW x10 each BIL Marching hip flexion to knee extension x20 BIL Hamstring curl x20 BIL Squats 2x20 Heel/toe raises x20   Pt requires the buoyancy of water for active assisted exercises with buoyancy supported for strengthening and AROM exercises. Hydrostatic pressure also supports joints by unweighting joint load by at least 50 % in 3-4 feet depth water. 80% in chest to neck deep water. Water will provide assistance with movement using the current and laminar flow while the buoyancy reduces weight bearing. Pt requires the viscosity of the water for resistance with strengthening exercises.   [facility] Adult PT Treatment:                                                DATE: 08/21/22 Aquatic therapy at [facility] [address] - therapeutic pool temp approximately 92 degrees. Pt enters building ambulating without AD and aide of another person. Treatment took place in water 3.8 to  4 ft 8 in.feet deep depending upon activity.  Pt entered and exited the pool via stair and handrails independently. Patient entered water for aquatic  therapy for first time and was introduced to principles and therapeutic effects of water as they ambulated and acclimated to pool.  Therapeutic Exercise: Aquatic Exercise: Walking forward/backwards/side stepping Standing with UE support edge of pool: Hip abd/add x20 BIL Hip ext/flex with knee straight x 20 BIL Hip Circles CC/CCW x10 each BIL Marching hip flexion to knee extension 2x10 BIL Hamstring curl x20 BIL Squats 2x20 Heel/toe raises x20 Step ups on submerged  step x10 BIL Pt requires the buoyancy of water for active assisted exercises with buoyancy supported for strengthening and AROM exercises. Hydrostatic pressure also supports joints by unweighting joint load by at least 50 % in 3-4 feet depth water. 80% in chest to neck deep water. Water will provide assistance with movement using the current and laminar flow while the buoyancy reduces weight bearing. Pt requires the viscosity of the water for resistance with strengthening exercises.                                                                                                                          [facility] Adult PT Treatment:                                                DATE: 08/12/22 Therapeutic Exercise: Developed, instructed in, and pt completed therex as noted in HEP   PATIENT EDUCATION:  Education details: Eval findings, POC, HEP, self care  Person educated: Patient Education method: Explanation, Demonstration, Tactile cues, Verbal cues, and Handouts Education comprehension: verbalized understanding, returned demonstration, verbal cues required, and tactile cues required  HOME EXERCISE PROGRAM: Access Code: [email or web address] URL: [email or web address] Date: 08/12/2022 Prepared by: Joellyn Rued  Exercises - Active Straight Leg Raise with Quad Set  - 1 x daily - 7 x weekly - 2 sets - 10 reps - 3 hold - Hooklying Clamshell with Resistance  - 1 x daily - 7 x weekly - 2 sets - 10 reps - 3 hold - Sidelying Hip  Abduction  - 1 x daily - 7 x weekly - 2 sets - 10 reps - 3 hold - Sit to Stand with Counter Support  - 1 x daily - 7 x weekly - 2 sets - 10 reps - 3 hold  ASSESSMENT:  CLINICAL IMPRESSION: Session today focused on knee and LE strength in the aquatic environment for use of buoyancy to offload joints and the viscosity of water as resistance during therapeutic exercise.  Pt with balance deficit with lunge walking which was mitigated with use of dumbbells.  Pt with fatigue but slight decrease in pain following session.  Patient was able to tolerate all prescribed exercises in the aquatic environment with no adverse effects and reports 2/10 pain at the end of the session. Patient continues to benefit from skilled PT services on land and aquatic based and should be progressed as able to improve functional independence.     OBJECTIVE IMPAIRMENTS: decreased activity tolerance, decreased balance, difficulty walking, decreased ROM, decreased strength, obesity, and pain.   ACTIVITY LIMITATIONS: carrying, lifting, bending, sitting, standing, squatting, stairs, and locomotion level  PARTICIPATION LIMITATIONS: meal prep, cleaning, laundry, community activity, and church  PERSONAL FACTORS: High BMI, CHF, CAD, L TKR, degenerative arthritis are also affecting patient's functional outcome.   REHAB POTENTIAL: Good  CLINICAL DECISION MAKING: Evolving/moderate  complexity  EVALUATION COMPLEXITY: Moderate   GOALS:  SHORT TERM GOALS: Target date: 09/04/22 Pt will be Ind in an initial HEP Baseline: started Goal status: MET  LONG TERM GOALS: Target date: 10/16/22  Pt will be Ind in a final HEP to maintain achieved LOF  Baseline: started Goal status: INITIAL  2.  Increase single standing for each LE to greater than 5 sec for improved functional mobility Baseline: 1" each LE Goal status: INITIAL  3.  Increase bilat knee and hip strength by at least 1/2 muscle strength grade for improved functional  mobility  Baseline: see flow sheets Goal status: INITIAL  4.  Improve 5xSTS by MCID of 5" and by MCID of 70ft as indication of improved functional mobility  Baseline: 5xSTS c use of hands from bari-chair 23.9". 370" Goal status: INITIAL  5.  Pt's FOTO score will improved to the predicted value of 56% as indication of improved function  Baseline: 47% Goal status: INITIAL PLAN:  PT FREQUENCY: 2x/week  PT DURATION: 8 weeks  PLANNED INTERVENTIONS: Therapeutic exercises, Therapeutic activity, Neuromuscular re-education, Balance training, Gait training, Patient/Family education, Self Care, Joint mobilization, Aquatic Therapy, Dry Needling, Electrical stimulation, Cryotherapy, Moist heat, Taping, Vasopneumatic device, Ultrasound, Ionotophoresis 4mg /ml Dexamethasone, Manual therapy, and Re-evaluation  PLAN FOR NEXT SESSION: Review FOTO; assess response to HEP; progress therex as indicated; use of modalities, manual therapy; and TPDN as indicated.  Kimberlee Nearing Adore Kithcart PT 09/10/22 3:33 PM Phone: [phone] Fax: [phone]

## 2022-09-14 ENCOUNTER — Ambulatory Visit: Payer: Medicare Other | Admitting: Physical Therapy

## 2022-09-14 ENCOUNTER — Encounter: Payer: Self-pay | Admitting: Physical Therapy

## 2022-09-14 DIAGNOSIS — M6281 Muscle weakness (generalized): Secondary | ICD-10-CM | POA: Diagnosis not present

## 2022-09-14 DIAGNOSIS — G8929 Other chronic pain: Secondary | ICD-10-CM

## 2022-09-14 DIAGNOSIS — R262 Difficulty in walking, not elsewhere classified: Secondary | ICD-10-CM | POA: Diagnosis not present

## 2022-09-14 DIAGNOSIS — M25562 Pain in left knee: Secondary | ICD-10-CM | POA: Diagnosis not present

## 2022-09-14 DIAGNOSIS — M25561 Pain in right knee: Secondary | ICD-10-CM | POA: Diagnosis not present

## 2022-09-14 NOTE — Therapy (Signed)
OUTPATIENT PHYSICAL THERAPY TREATMENT NOTE   Patient Name: Heather [name] MRN: [id number] DOB:[birth year], 77 y.o., female Today's Date: 09/14/2022  END OF SESSION:  PT End of Session - 09/14/22 1103     Visit Number 7    Number of Visits 17    Date for PT Re-Evaluation 10/16/22    Authorization Type MEDICARE PART A AND B    PT Start Time 1100    PT Stop Time 1154    PT Time Calculation (min) 54 min                  Past Medical History:  Diagnosis Date   Abdominal hernia    Abnormal Pap smear    ALLERGIC RHINITIS 10/22/2006   Allergy    ANEMIA 12/18/2008   Arthritis    scoliosis moderate deg changes lumbar Xray 11/04/17    Asthma    ASYMPTOMATIC POSTMENOPAUSAL STATUS 11/22/2007   Back pain    CAD (coronary artery disease)    in LAD   CHF (congestive heart failure) (HCC)    Complication of anesthesia 2010   pt states she woke up during colonoscopy and was aware of "losing control of breathing" during mammoplasty   Degenerative arthritis    Diverticulosis    Dyslipidemia    Fibroid    Frequent headaches    GERD 07/20/2007   GOITER, MULTINODULAR 07/20/2007   Headache(784.0) 07/20/2007   HEARING LOSS 11/22/2007   Hiatal hernia    Hiatal hernia    large   History of chicken pox    Hx of colposcopy with cervical biopsy    HYPERCHOLESTEROLEMIA 01/13/2007   Hyperglycemia    HYPERTENSION 10/22/2006   Lung nodule    unchanged since 05/26/13    Migraines    NASH (nonalcoholic steatohepatitis)    Nocturnal hypoxemia 02/17/2013   Obesity    Obstructive sleep apnea    OSA on CPAP    per neurology   OSTEOARTHRITIS 10/22/2006   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   Pre-diabetes    Sedimentation rate elevation    Sleep apnea    on cpap   Urine incontinence    UTI (urinary tract infection)    Past Surgical History:  Procedure Laterality Date   BREAST SURGERY  1984   Breast reduction b/l    CATARACT EXTRACTION, BILATERAL     DEXA   08/2005   DILATION AND CURETTAGE OF UTERUS     ELECTROCARDIOGRAM  10/15/2006   ESOPHAGOGASTRODUODENOSCOPY  12/08/2005   FOOT SURGERY     hammertoe and bunion 05/2017    Stress Cardiolite  10/21/2005   sweat gland removal     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   Procedure: Left TOTAL KNEE ARTHROPLASTY;  Surgeon: Kathryne Hitch, MD;  Location: [facility] OR;  Service: Orthopedics;  Laterality: Left;   UMBILICAL HERNIA REPAIR N/A 04/25/2022   Procedure: OPEN INCARCERATED HERNIA REPAIR UMBILICAL WITH APPENDECTOMY;  Surgeon: Quentin Ore, MD;  Location: [facility] OR;  Service: General;  Laterality: N/A;   WISDOM TOOTH EXTRACTION     Patient Active Problem List   Diagnosis Date Noted   Urge incontinence 08/04/2022   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 06/16/2022   Hypotension 05/26/2022   BMI 34.0-34.9,adult 05/06/2022   Obesity, Beginning BMI 44.60 05/06/2022   SBO (small bowel obstruction) (HCC) 04/25/2022   Obstructive sleep apnea treated with BiPAP 04/25/2022   Dependence on nocturnal oxygen therapy 04/25/2022   Hyponatremia  04/25/2022   Generalized abdominal pain 04/23/2022   BMI 36.0-36.9,adult 04/08/2022   Obesity, Beginning BMI 44.60 04/08/2022   Persistent cough for 3 weeks or longer 02/13/2022   Type 2 diabetes mellitus with other specified complication (HCC) 12/10/2021   Traumatic tear of supraspinatus tendon of right shoulder 11/24/2021   Right arm pain 11/18/2021   Positive self-administered antigen test for COVID-19 11/09/2021   Stress 10/16/2021   Status post total left knee replacement 03/11/2021   Chronic pain 01/06/2021   Lumbar radiculopathy 07/04/2020   Bilateral sciatica 07/04/2020   Pain in both hands 07/04/2020   Hyperlipidemia associated with type 2 diabetes mellitus (HCC) 05/01/2020   Abnormal blood level of copper 03/14/2020   Leukopenia 03/14/2020   Obesity (BMI 30-39.9) 11/13/2019   Chronic pain of  right thumb 10/11/2019   Vitamin D deficiency 07/27/2019   Memory loss 04/27/2019   Mass of left wrist 01/18/2019   Ventral hernia without obstruction or gangrene 01/18/2019   Mass of right thigh 09/13/2018   Depression 08/02/2018   Sleeps in sitting position due to orthopnea 07/13/2018   BiPAP (biphasic positive airway pressure) dependence 05/24/2018   Goiter 04/15/2017   Urinary incontinence 04/15/2017   Type 2 diabetes mellitus without complication, without long-term current use of insulin (HCC) 02/15/2017   Bilateral leg cramps 01/05/2017   Peripheral neuropathy 05/27/2016   Elevated sed rate 03/05/2016   Muscle spasm of both lower legs 02/12/2016   Leg edema 02/12/2016   Iron deficiency anemia 02/12/2016   Hypokalemia 02/12/2016   Pernicious anemia 02/12/2016   Degenerative disc disease, lumbar 04/09/2015   Degenerative arthritis of knee, bilateral 03/13/2015   Chronic low back pain 03/13/2015   CAD S/P percutaneous coronary angioplasty 10/31/2013   Incidental lung nodule, > 3mm and < 8mm, Left lower lobe 7mm - June 2014, April 2015 unchanged 06/25/2013   OSA on CPAP 06/21/2013   Myalgia and myositis 05/02/2013   Muscle ache 04/03/2013   Class 3 severe obesity with serious comorbidity and body mass index (BMI) of 40.0 to 44.9 in adult (HCC) 01/24/2013   Large hiatal hernia 08/24/2012    LLL nodule 6mm - June 2014, described in 2003 07/13/2012   Asthma, chronic 06/29/2012   Migraine without aura 03/11/2012   SOB (shortness of breath) 12/28/2011   Hx of fibroids 08/03/2011   Knee pain, left 12/25/2010   NECK PAIN 12/03/2009   TINNITUS 11/22/2007   HEARING LOSS 11/22/2007   Chronic pain of both knees 11/22/2007   Gastroesophageal reflux disease without esophagitis 07/20/2007   NASH (nonalcoholic steatohepatitis) 07/20/2007   ANXIETY 10/22/2006   Allergic rhinitis 10/22/2006   Osteoarthritis 10/22/2006    PCP: Pincus Sanes, MD  REFERRING PROVIDER: Judi Saa,  DO   REFERRING DIAG: M25.561,M25.562,G89.29 (ICD-10-CM) - Chronic pain of both knees   THERAPY DIAG:  Chronic pain of left knee  Chronic pain of right knee  Muscle weakness (generalized)  Difficulty in walking, not elsewhere classified  Rationale for Evaluation and Treatment: Rehabilitation  ONSET DATE: 5 months  SUBJECTIVE:   SUBJECTIVE STATEMENT: I love the water. But the pain comes back so intensely after I get out of the water. The pain is all over my body.  No pain right now while sitting still. Standing >30 sec causes increased back and knee pain.    PERTINENT HISTORY: High BMI, CHF, CAD, L TKR, degenerative arthritis  PAIN:  Are you having pain? Yes: NPRS scale: 0/10 Pain location: Both knees Pain  description: Tightness/achiness Aggravating factors: Cold, extended feet, after periods of rest Relieving factors: Occasional use of pain medication, seated elliptical Normal Range: 3-6/10  PRECAUTIONS: None  WEIGHT BEARING RESTRICTIONS: No  FALLS:  Has patient fallen in last 6 months? 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, reads, church-sings in choir  PLOF: Independent  PATIENT GOALS: Pt be Ind c better quality of movement  NEXT MD VISIT: Next week  OBJECTIVE:   DIAGNOSTIC FINDINGS:  DG L knee 07/10/22 L knee Narrative: Well-seated prosthesis without complication   PATIENT SURVEYS:  FOTO: Perceived function   47%, predicted   56%  09/14/22: 43%  COGNITION: Overall cognitive status: Within functional limits for tasks assessed     SENSATION: WFL  EDEMA:  Swelling of both legs c pitting edema of ankle at sock line  MUSCLE LENGTH: Hamstrings: Right 40 deg; Left 40 deg Thomas test:  Right Tight deg; Left tight deg  POSTURE: rounded shoulders, forward head, and increased thoracic kyphosis  PALPATION: Not overly TTP of the knees  LOWER EXTREMITY ROM:  Active ROM Right eval Left eval Right 09/14/22 Left 09/14/22  Hip flexion      Hip extension      Hip abduction      Hip adduction      Hip internal rotation      Hip external rotation      Knee flexion 95 100 98 104  Knee extension 10 0    Ankle dorsiflexion      Ankle plantarflexion      Ankle inversion      Ankle eversion       (Blank rows = not tested)  LOWER EXTREMITY MMT:  MMT Right eval Left eval Right 09/14/22 Left  09/14/22  Hip flexion 4- 4- 4 4  Hip extension 3 3    Hip abduction 4- 4- 4 4-  Hip adduction      Hip internal rotation      Hip external rotation 4 4    Knee flexion 4+ 4+    Knee extension 5 5    Ankle dorsiflexion      Ankle plantarflexion      Ankle inversion      Ankle eversion       (Blank rows = not tested)  LOWER EXTREMITY SPECIAL TESTS:    For the R knee: Knee special tests: Anterior drawer test: negative, Posterior drawer test: negative, and McMurray's test: negative  FUNCTIONAL TESTS:  5 times sit to stand: 23.9 2 minute walk test: 370' 09/14/22: 5 x STS   24.3 sec with UE, bariatric chair 09/14/22: 2 MWT: 424 feet   Single leg stance: 1" 09/14/22: 9 sec SLS , each leg    GAIT: Distance walked: 370' Assistive device utilized: None Level of assistance: Complete Independence Comments: Slower pace      TODAY'S TREATMENT:  [facility] Adult PT Treatment:                                                DATE: 09/14/22 Therapeutic Exercise: SLR x 10 each  Hooklying vs supine gluteal sets  Standing hip abduction  x 10 each  Heel raises and toe raises x10 each SLS 9 sec each Tandem stance- increased back pain Heel slides with strap x 3 each  Supine hamstring /gastroc stretch with strap Right hip flexor hang- added strap for education Seated pelvic  rocking STS from mat with cues for weight shift. X 4   Therapeutic Activity: 2 MWT 5 x STS      [facility] Adult PT Treatment:                                                DATE: 09-10-22  Aquatic therapy at [facility] [address] - therapeutic pool temp approximately 92 degrees. Pt enters building ambulating without AD and aide of another person. Treatment took place in water 3.8 to  4 ft 8 in.feet deep depending upon activity.  Pt entered and exited the pool via stair and handrails independently. Pain at initiation of aquatic treatment 2/10  At end 0/10   Aquatic Exercise: Standing with UE support edge of pool: use of aquatic fins for added resistance  Fwd/lateral/bkwds - 2 laps ea Marching hip flexion to knee extension x20 BIL Heel/toe raises x 20 Ham curls x 20 Squats 2x20 Hip abd/add x20 BIL Hip ext/flex with knee straight x 20 BIL Walking lunge - 2 laps with rainbow DB L stretch with hip ext - 20x Hip Circles CC/CCW x10 each BIL Hamstring curl x20 BIL Yellow DB push down - 20x with abdominal bracing Gentle rotation with yellow dumbells - 20x Marching/jogging in place with CGA x 1  Hopping in place bil with CGA x 1   Pt requires the buoyancy of water for active assisted exercises with buoyancy supported for strengthening and AROM exercises. Hydrostatic pressure also supports joints by unweighting joint load by at least 50 % in 3-4 feet depth water. 80% in chest to neck deep water. Water will provide assistance with movement using the current and laminar flow while the buoyancy reduces weight bearing. Pt requires the viscosity of the water for resistance with strengthening exercises.  [facility] Adult PT Treatment:                                                DATE: 09-09-22  Aquatic therapy at [facility] [address] - therapeutic pool temp approximately 92 degrees. Pt enters building ambulating without AD and aide of another person. Treatment took place in water 3.8  to  4 ft 8 in.feet deep depending upon activity.  Pt entered and exited the pool via stair and handrails independently. Pain at initiation of aquatic treatment 2/10  At end 0/10   Aquatic Exercise: Standing with UE support edge of pool: use of aquatic fins for added resistance  Marching hip flexion to knee extension x20 BIL Heel/toe raises x 20 Ham curls x 20 Squats 2x20 Hip abd/add x20 BIL Hip ext/flex with knee straight x 20 BIL Lunge to Target 1 x 10 R and L Hip Circles CC/CCW x10 each BIL Hamstring curl x20 BIL Runners stretch x30" BIL Hamstring stretch x30" BIL SL standing for balance without UE support Side stepping on pool noodle for balance Aqua Stretch for bil quads and PT assisted stretch for quads  Marching/jogging in place with CGA x 1  Hopping in place bil with CGA x 1  Pt requires the buoyancy of water for active assisted exercises with buoyancy supported for strengthening and AROM exercises. Hydrostatic pressure also supports joints by unweighting joint load by at least 50 % in 3-4 feet depth water. 80% in chest to neck deep water. Water will provide assistance with movement using the current and laminar flow while the buoyancy reduces weight bearing. Pt requires the viscosity of the water for resistance with strengthening exercises.  [facility] Adult PT Treatment:                                                DATE: 09-02-22 Aquatic therapy at [facility] [address] - therapeutic pool temp approximately 92 degrees. Pt enters building ambulating without AD and aide of another person. Treatment took place in water 3.8 to  4 ft 8 in.feet deep depending upon activity.  Pt entered and exited the pool via stair and handrails independently. Pain at initiation of aquatic treatment 0/10  At end 0/10   Aquatic Exercise: Standing with UE support edge of pool: use of aquatic fins for added resistance  Marching hip flexion to knee extension x20 BIL Hip abd/add x20 BIL Hip  ext/flex with knee straight x 20 BIL Hip Circles CC/CCW x10 each BIL Hamstring curl x20 BIL Squats 2x20 Heel/toe raises x 20 Runners stretch x30" BIL Hamstring stretch x30" BIL SL standing for balance without UE support Seated on submerged lift chair with aquatic fins Bicycle kicks x1' Reciprocal kicking and flexion Flutter kicks x1' Scissor kicks x 1'  Walking forward/backwards/side stepping no fins Sidestepping with red bell shoulder abd/add x2 laps no fins  Pt requires the buoyancy of water for active assisted exercises with buoyancy supported for strengthening and AROM exercises. Hydrostatic pressure also supports joints by unweighting joint load by at least 50 % in 3-4 feet depth water. 80% in chest to neck deep water. Water will provide assistance with movement using the current and laminar flow while the buoyancy reduces weight bearing. Pt requires the viscosity of the water for resistance with strengthening exercises.  [facility] Adult PT Treatment:                                                DATE: 08/27/22 Aquatic therapy at [facility] [address] - therapeutic pool temp approximately 92 degrees. Pt enters building ambulating without AD and aide of another person. Treatment took place in water 3.8 to  4 ft 8 in.feet deep depending upon activity.  Pt entered and exited the pool via stair and handrails independently.  Therapeutic Exercise: Aquatic Exercise: Walking forward/backwards/side stepping Sidestepping with rainbow DB shoulder abd/add x2 laps Shoulder horizontal abduction pink bells x15 Standing with UE support edge of pool: Hip abd/add x20 BIL Hip ext/flex with knee straight x 20 BIL Hip Circles CC/CCW x10 each BIL Marching hip flexion to knee extension x20 BIL Hamstring curl x20 BIL Squats 2x20 Heel/toe raises x20   Pt requires the buoyancy of water for active assisted exercises with buoyancy supported for strengthening and AROM exercises. Hydrostatic  pressure also supports joints by unweighting joint load by at least 50 % in  3-4 feet depth water. 80% in chest to neck deep water. Water will provide assistance with movement using the current and laminar flow while the buoyancy reduces weight bearing. Pt requires the viscosity of the water for resistance with strengthening exercises.   [facility] Adult PT Treatment:                                                DATE: 08/21/22 Aquatic therapy at [facility] [address] - therapeutic pool temp approximately 92 degrees. Pt enters building ambulating without AD and aide of another person. Treatment took place in water 3.8 to  4 ft 8 in.feet deep depending upon activity.  Pt entered and exited the pool via stair and handrails independently. Patient entered water for aquatic therapy for first time and was introduced to principles and therapeutic effects of water as they ambulated and acclimated to pool.  Therapeutic Exercise: Aquatic Exercise: Walking forward/backwards/side stepping Standing with UE support edge of pool: Hip abd/add x20 BIL Hip ext/flex with knee straight x 20 BIL Hip Circles CC/CCW x10 each BIL Marching hip flexion to knee extension 2x10 BIL Hamstring curl x20 BIL Squats 2x20 Heel/toe raises x20 Step ups on submerged step x10 BIL Pt requires the buoyancy of water for active assisted exercises with buoyancy supported for strengthening and AROM exercises. Hydrostatic pressure also supports joints by unweighting joint load by at least 50 % in 3-4 feet depth water. 80% in chest to neck deep water. Water will provide assistance with movement using the current and laminar flow while the buoyancy reduces weight bearing. Pt requires the viscosity of the water for resistance with strengthening exercises.                                                                                                                          [facility] Adult PT Treatment:                                                 DATE: 08/12/22 Therapeutic Exercise: Developed, instructed in, and pt completed therex as noted in HEP   PATIENT EDUCATION:  Education details: Eval findings, POC, HEP, self care  Person educated: Patient Education method: Explanation, Demonstration, Tactile cues, Verbal cues, and Handouts Education comprehension: verbalized understanding, returned demonstration, verbal cues required, and tactile cues required  HOME EXERCISE PROGRAM: Access Code: [email or web address] URL: [email or web address] Date: 08/12/2022 Prepared by: Joellyn Rued  Exercises - Active Straight Leg Raise with Quad Set  - 1 x daily - 7 x weekly - 2 sets - 10 reps - 3 hold - Hooklying Clamshell with Resistance  - 1 x daily - 7 x weekly - 2 sets - 10 reps - 3 hold -  Sidelying Hip Abduction  - 1 x daily - 7 x weekly - 2 sets - 10 reps - 3 hold - Sit to Stand with Counter Support  - 1 x daily - 7 x weekly - 2 sets - 10 reps - 3 hold  ASSESSMENT:  CLINICAL IMPRESSION: Pt arrives for first land visit after completing 5 aquatic treatments. She reports that she loves the water and feels no pain while in the water. However, her pain returns immediately after getting out of the water and reports that her arthritis pain has actually been worse lately. Objectively, her 2 MWT, SLS and some LE MMT measures have improved. She was able to rise from seated without UE after cues for weight shifting. She reports using her Nustep 3-4 x per week for 1 hour which helps her pain. She reports difficulty completing HEP and has completed those 1 x per week. Introduced hip extension strengthening and options for closed chain LE strengthening. Will assess her response to this session and plan to update her land HEP.   Patient continues to benefit from skilled PT services on land and aquatic based and should be progressed as able to improve functional independence.     OBJECTIVE IMPAIRMENTS: decreased activity tolerance, decreased balance,  difficulty walking, decreased ROM, decreased strength, obesity, and pain.   ACTIVITY LIMITATIONS: carrying, lifting, bending, sitting, standing, squatting, stairs, and locomotion level  PARTICIPATION LIMITATIONS: meal prep, cleaning, laundry, community activity, and church  PERSONAL FACTORS: High BMI, CHF, CAD, L TKR, degenerative arthritis are also affecting patient's functional outcome.   REHAB POTENTIAL: Good  CLINICAL DECISION MAKING: Evolving/moderate complexity  EVALUATION COMPLEXITY: Moderate   GOALS:  SHORT TERM GOALS: Target date: 09/04/22 Pt will be Ind in an initial HEP Baseline: started 09/14/22: can complete seated elliptical for 1 hour, 3-4 x per week, HEP 1 x per week.  Goal status: ONGOING  LONG TERM GOALS: Target date: 10/16/22  Pt will be Ind in a final HEP to maintain achieved LOF  Baseline: started Goal status: ONGOING  2.  Increase single standing for each LE to greater than 5 sec for improved functional mobility Baseline: 1" each LE 09/14/22: 9 sec Goal status: MET  3.  Increase bilat knee and hip strength by at least 1/2 muscle strength grade for improved functional mobility  Baseline: see flow sheets Goal status: ONGOING  4.  Improve 5xSTS by MCID of 5" and by MCID of 96ft as indication of improved functional mobility  Baseline: 5xSTS c use of hands from bari-chair 23.9". 370" 09/14/22: 5 x STS 24.3 sec, 2 MWT 424 feet  Goal status: Partially met   5.  Pt's FOTO score will improved to the predicted value of 56% as indication of improved function  Baseline: 47% 09/14/22: 44% Goal status: ONGOING PLAN:  PT FREQUENCY: 2x/week  PT DURATION: 8 weeks  PLANNED INTERVENTIONS: Therapeutic exercises, Therapeutic activity, Neuromuscular re-education, Balance training, Gait training, Patient/Family education, Self Care, Joint mobilization, Aquatic Therapy, Dry Needling, Electrical stimulation, Cryotherapy, Moist heat, Taping, Vasopneumatic device,  Ultrasound, Ionotophoresis 4mg /ml Dexamethasone, Manual therapy, and Re-evaluation  PLAN FOR NEXT SESSION: ; assess response to HEP; progress therex as indicated; use of modalities, manual therapy; and TPDN as indicated; update HEP for land (gastroc stretch, gluteal strengt, STS  Royden Purl PTA 09/14/22 12:13 PM Phone: [phone] Fax: [phone]

## 2022-09-17 ENCOUNTER — Ambulatory Visit: Payer: Medicare Other | Admitting: Podiatry

## 2022-09-17 ENCOUNTER — Encounter: Payer: Self-pay | Admitting: Podiatry

## 2022-09-17 DIAGNOSIS — B351 Tinea unguium: Secondary | ICD-10-CM | POA: Diagnosis not present

## 2022-09-17 DIAGNOSIS — D2372 Other benign neoplasm of skin of left lower limb, including hip: Secondary | ICD-10-CM | POA: Diagnosis not present

## 2022-09-17 DIAGNOSIS — D2371 Other benign neoplasm of skin of right lower limb, including hip: Secondary | ICD-10-CM | POA: Diagnosis not present

## 2022-09-17 DIAGNOSIS — D689 Coagulation defect, unspecified: Secondary | ICD-10-CM | POA: Diagnosis not present

## 2022-09-17 DIAGNOSIS — M79676 Pain in unspecified toe(s): Secondary | ICD-10-CM | POA: Diagnosis not present

## 2022-09-17 NOTE — Progress Notes (Signed)
Presents today chief complaint of painful elongated toenails.  Objective: Pulses are palpable.  No open lesions or wounds.  Skin is supple and well-hydrated.  Toenails are long thick yellow dystrophic and mycotic sharply incurvated tender on palpation.  Assessment: Pain limb secondary to onychomycosis.  Pain in limb secondary to onychocryptosis.  Plan: Debridement of toenails 1 through 5 bilateral

## 2022-09-17 NOTE — Therapy (Signed)
OUTPATIENT PHYSICAL THERAPY TREATMENT NOTE   Patient Name: Heather [name] MRN: [id number] DOB:October 05, 1945, 77 y.o., female Today's Date: 09/18/2022  END OF SESSION:  PT End of Session - 09/18/22 1126     Visit Number 8    Number of Visits 17    Date for PT Re-Evaluation 10/16/22    Authorization Type MEDICARE PART A AND B    PT Start Time 1135    PT Stop Time 1220    PT Time Calculation (min) 45 min    Activity Tolerance Patient tolerated treatment well    Behavior During Therapy [facility] for tasks assessed/performed             Past Medical History:  Diagnosis Date   Abdominal hernia    Abnormal Pap smear    ALLERGIC RHINITIS 10/22/2006   Allergy    ANEMIA 12/18/2008   Arthritis    scoliosis moderate deg changes lumbar Xray 11/04/17    Asthma    ASYMPTOMATIC POSTMENOPAUSAL STATUS 11/22/2007   Back pain    CAD (coronary artery disease)    in LAD   CHF (congestive heart failure) (HCC)    Complication of anesthesia 2010   pt states she woke up during colonoscopy and was aware of "losing control of breathing" during mammoplasty   Degenerative arthritis    Diverticulosis    Dyslipidemia    Fibroid    Frequent headaches    GERD 07/20/2007   GOITER, MULTINODULAR 07/20/2007   Headache(784.0) 07/20/2007   HEARING LOSS 11/22/2007   Hiatal hernia    Hiatal hernia    large   History of chicken pox    Hx of colposcopy with cervical biopsy    HYPERCHOLESTEROLEMIA 01/13/2007   Hyperglycemia    HYPERTENSION 10/22/2006   Lung nodule    unchanged since 05/26/13    Migraines    NASH (nonalcoholic steatohepatitis)    Nocturnal hypoxemia 02/17/2013   Obesity    Obstructive sleep apnea    OSA on CPAP    per neurology   OSTEOARTHRITIS 10/22/2006   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   Pre-diabetes    Sedimentation rate elevation    Sleep apnea    on cpap   Urine incontinence    UTI (urinary tract infection)    Past Surgical History:  Procedure  Laterality Date   BREAST SURGERY  1984   Breast reduction b/l    CATARACT EXTRACTION, BILATERAL     DEXA  08/2005   DILATION AND CURETTAGE OF UTERUS     ELECTROCARDIOGRAM  10/15/2006   ESOPHAGOGASTRODUODENOSCOPY  12/08/2005   FOOT SURGERY     hammertoe and bunion 05/2017    Stress Cardiolite  10/21/2005   sweat gland removal     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   Procedure: Left TOTAL KNEE ARTHROPLASTY;  Surgeon: Kathryne Hitch, MD;  Location: [facility] OR;  Service: Orthopedics;  Laterality: Left;   UMBILICAL HERNIA REPAIR N/A 04/25/2022   Procedure: OPEN INCARCERATED HERNIA REPAIR UMBILICAL WITH APPENDECTOMY;  Surgeon: Dossie Der Hyman Hopes, MD;  Location: [facility] OR;  Service: General;  Laterality: N/A;   WISDOM TOOTH EXTRACTION     Patient Active Problem List   Diagnosis Date Noted   Urge incontinence 08/04/2022   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 06/16/2022   Hypotension 05/26/2022   BMI 34.0-34.9,adult 05/06/2022   Obesity, Beginning BMI 44.60 05/06/2022   SBO (small bowel obstruction) (HCC) 04/25/2022   Obstructive sleep apnea treated  with BiPAP 04/25/2022   Dependence on nocturnal oxygen therapy 04/25/2022   Hyponatremia 04/25/2022   Generalized abdominal pain 04/23/2022   BMI 36.0-36.9,adult 04/08/2022   Obesity, Beginning BMI 44.60 04/08/2022   Persistent cough for 3 weeks or longer 02/13/2022   Type 2 diabetes mellitus with other specified complication (HCC) 12/10/2021   Traumatic tear of supraspinatus tendon of right shoulder 11/24/2021   Right arm pain 11/18/2021   Positive self-administered antigen test for COVID-19 11/09/2021   Stress 10/16/2021   Status post total left knee replacement 03/11/2021   Chronic pain 01/06/2021   Lumbar radiculopathy 07/04/2020   Bilateral sciatica 07/04/2020   Pain in both hands 07/04/2020   Hyperlipidemia associated with type 2 diabetes mellitus (HCC) 05/01/2020   Abnormal blood  level of copper 03/14/2020   Leukopenia 03/14/2020   Obesity (BMI 30-39.9) 11/13/2019   Chronic pain of right thumb 10/11/2019   Vitamin D deficiency 07/27/2019   Memory loss 04/27/2019   Mass of left wrist 01/18/2019   Ventral hernia without obstruction or gangrene 01/18/2019   Mass of right thigh 09/13/2018   Depression 08/02/2018   Sleeps in sitting position due to orthopnea 07/13/2018   BiPAP (biphasic positive airway pressure) dependence 05/24/2018   Goiter 04/15/2017   Urinary incontinence 04/15/2017   Type 2 diabetes mellitus without complication, without long-term current use of insulin (HCC) 02/15/2017   Bilateral leg cramps 01/05/2017   Peripheral neuropathy 05/27/2016   Elevated sed rate 03/05/2016   Muscle spasm of both lower legs 02/12/2016   Leg edema 02/12/2016   Iron deficiency anemia 02/12/2016   Hypokalemia 02/12/2016   Pernicious anemia 02/12/2016   Degenerative disc disease, lumbar 04/09/2015   Degenerative arthritis of knee, bilateral 03/13/2015   Chronic low back pain 03/13/2015   CAD S/P percutaneous coronary angioplasty 10/31/2013   Incidental lung nodule, > 3mm and < 8mm, Left lower lobe 7mm - June 2014, April 2015 unchanged 06/25/2013   OSA on CPAP 06/21/2013   Myalgia and myositis 05/02/2013   Muscle ache 04/03/2013   Class 3 severe obesity with serious comorbidity and body mass index (BMI) of 40.0 to 44.9 in adult (HCC) 01/24/2013   Large hiatal hernia 08/24/2012    LLL nodule 6mm - June 2014, described in 2003 07/13/2012   Asthma, chronic 06/29/2012   Migraine without aura 03/11/2012   SOB (shortness of breath) 12/28/2011   Hx of fibroids 08/03/2011   Knee pain, left 12/25/2010   NECK PAIN 12/03/2009   TINNITUS 11/22/2007   HEARING LOSS 11/22/2007   Chronic pain of both knees 11/22/2007   Gastroesophageal reflux disease without esophagitis 07/20/2007   NASH (nonalcoholic steatohepatitis) 07/20/2007   ANXIETY 10/22/2006   Allergic rhinitis  10/22/2006   Osteoarthritis 10/22/2006    PCP: Pincus Sanes, MD  REFERRING PROVIDER: Judi Saa, DO   REFERRING DIAG: M25.561,M25.562,G89.29 (ICD-10-CM) - Chronic pain of both knees   THERAPY DIAG:  Chronic pain of left knee  Chronic pain of right knee  Muscle weakness (generalized)  Difficulty in walking, not elsewhere classified  Rationale for Evaluation and Treatment: Rehabilitation  ONSET DATE: 5 months  SUBJECTIVE:   SUBJECTIVE STATEMENT: Patient reports that she has had some pain and soreness from last land session. She states that her pain is better in the morning and that it gets worse throughout the day, especially after 4pm.   PERTINENT HISTORY: High BMI, CHF, CAD, L TKR, degenerative arthritis  PAIN:  Are you having pain? Yes: NPRS scale:  2/10 Pain location: Both knees Pain description: Tightness/achiness Aggravating factors: Cold, extended feet, after periods of rest Relieving factors: Occasional use of pain medication, seated elliptical Normal Range: 3-6/10  PRECAUTIONS: None  WEIGHT BEARING RESTRICTIONS: No  FALLS:  Has patient fallen in last 6 months? 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, reads, church-sings in choir  PLOF: Independent  PATIENT GOALS: Pt be Ind c better quality of movement  NEXT MD VISIT: Next week  OBJECTIVE:   DIAGNOSTIC FINDINGS:  DG L knee 07/10/22 L knee Narrative: Well-seated prosthesis without complication   PATIENT SURVEYS:  FOTO: Perceived function   47%, predicted   56%  09/14/22: 43%  COGNITION: Overall cognitive status: Within functional limits for tasks assessed     SENSATION: WFL  EDEMA:  Swelling of both legs c pitting edema  of ankle at sock line  MUSCLE LENGTH: Hamstrings: Right 40 deg; Left 40 deg Thomas test: Right Tight deg; Left tight deg  POSTURE: rounded shoulders, forward head, and increased thoracic kyphosis  PALPATION: Not overly TTP of the knees  LOWER EXTREMITY ROM:  Active ROM Right eval Left eval Right 09/14/22 Left 09/14/22  Hip flexion      Hip extension      Hip abduction      Hip adduction      Hip internal rotation      Hip external rotation      Knee flexion 95 100 98 104  Knee extension 10 0    Ankle dorsiflexion      Ankle plantarflexion      Ankle inversion      Ankle eversion       (Blank rows = not tested)  LOWER EXTREMITY MMT:  MMT Right eval Left eval Right 09/14/22 Left  09/14/22  Hip flexion 4- 4- 4 4  Hip extension 3 3    Hip abduction 4- 4- 4 4-  Hip adduction      Hip internal rotation      Hip external rotation 4 4    Knee flexion 4+ 4+    Knee extension 5 5    Ankle dorsiflexion      Ankle plantarflexion      Ankle inversion      Ankle eversion       (Blank rows = not tested)  LOWER EXTREMITY SPECIAL TESTS:    For the R knee: Knee special tests: Anterior drawer test: negative, Posterior drawer test: negative, and McMurray's test: negative  FUNCTIONAL TESTS:  5 times sit to stand: 23.9 2 minute walk test: 370' 09/14/22: 5 x STS   24.3 sec with UE, bariatric chair 09/14/22: 2 MWT: 424 feet   Single leg stance: 1" 09/14/22: 9 sec SLS , each leg    GAIT: Distance walked: 370' Assistive device utilized: None Level of assistance: Complete Independence Comments: Slower pace    TODAY'S TREATMENT:  [facility] Adult PT Treatment:                                                DATE: 09/17/22 Aquatic therapy at [facility]- [address] - therapeutic  pool temp approximately 92 degrees. Pt enters building ambulating without AD and aide of another person. Treatment took place in water 3.8 to  4 ft 8 in.feet deep depending upon activity.  Pt entered  and exited the pool via stair and handrails independently. Pain at initiation of aquatic treatment 2/10  At end 0/10   Aquatic Exercise: Fwd/lateral/bkwds - 2 laps ea Walking lunge - 2 laps with yellow DB Gentle rotation with yellow dumbells - x1' Step ups on bottom step x10 each fwd/lat BIL Pool edge with UE support as needed:  Marching hip flexion to knee extension x20 BIL Heel/toe raises x 20 Ham curls x 20 Squats 2x20 Hip abd/add x20 BIL Hip ext/flex with knee straight x 20 BIL Hip Circles CC/CCW x10 each BIL   Pt requires the buoyancy of water for active assisted exercises with buoyancy supported for strengthening and AROM exercises. Hydrostatic pressure also supports joints by unweighting joint load by at least 50 % in 3-4 feet depth water. 80% in chest to neck deep water. Water will provide assistance with movement using the current and laminar flow while the buoyancy reduces weight bearing. Pt requires the viscosity of the water for resistance with strengthening exercises.  [facility] Adult PT Treatment:                                                DATE: 09/14/22 Therapeutic Exercise: SLR x 10 each  Hooklying vs supine gluteal sets  Standing hip abduction  x 10 each  Heel raises and toe raises x10 each SLS 9 sec each Tandem stance- increased back pain Heel slides with strap x 3 each  Supine hamstring /gastroc stretch with strap Right hip flexor hang- added strap for education Seated pelvic rocking STS from mat with cues for weight shift. X 4   Therapeutic Activity: 2 MWT 5 x STS      [facility] Adult PT Treatment:                                                DATE: 09-10-22  Aquatic therapy at [facility]- [address] - therapeutic pool temp approximately 92 degrees. Pt enters building ambulating without AD and aide of another person. Treatment took place in water 3.8 to  4 ft 8 in.feet deep depending upon activity.  Pt entered and exited the pool via stair and  handrails independently. Pain at initiation of aquatic treatment 2/10  At end 0/10   Aquatic Exercise: Standing with UE support edge of pool: use of aquatic fins for added resistance  Fwd/lateral/bkwds - 2 laps ea Marching hip flexion to knee extension x20 BIL Heel/toe raises x 20 Ham curls x 20 Squats 2x20 Hip abd/add x20 BIL Hip ext/flex with knee straight x 20 BIL Walking lunge - 2 laps with rainbow DB L stretch with hip ext - 20x Hip Circles CC/CCW x10 each BIL Hamstring curl x20 BIL Yellow DB push down - 20x with abdominal bracing Gentle rotation with yellow dumbells - 20x Marching/jogging in place with CGA x 1  Hopping in place bil with CGA x 1   Pt requires the buoyancy of water for active assisted exercises with buoyancy supported for strengthening and AROM exercises. Hydrostatic  pressure also supports joints by unweighting joint load by at least 50 % in 3-4 feet depth water. 80% in chest to neck deep water. Water will provide assistance with movement using the current and laminar flow while the buoyancy reduces weight bearing. Pt requires the viscosity of the water for resistance with strengthening exercises.    PATIENT EDUCATION:  Education details: Eval findings, POC, HEP, self care  Person educated: Patient Education method: Explanation, Demonstration, Tactile cues, Verbal cues, and Handouts Education comprehension: verbalized understanding, returned demonstration, verbal cues required, and tactile cues required  HOME EXERCISE PROGRAM: Access Code: [email or web address] URL: [email or web address] Date: 08/12/2022 Prepared by: Joellyn Rued  Exercises - Active Straight Leg Raise with Quad Set  - 1 x daily - 7 x weekly - 2 sets - 10 reps - 3 hold - Hooklying Clamshell with Resistance  - 1 x daily - 7 x weekly - 2 sets - 10 reps - 3 hold - Sidelying Hip Abduction  - 1 x daily - 7 x weekly - 2 sets - 10 reps - 3 hold - Sit to Stand with Counter Support  - 1 x daily -  7 x weekly - 2 sets - 10 reps - 3 hold  ASSESSMENT:  CLINICAL IMPRESSION: Patient presents to PT reporting continued BIL knee pain as well as full body pain that she says is low in the morning and gets worse in the afternoon/evening. She reports some soreness and knee pain after land session earlier this week. Session today focused on BIL LE strengthening and continuous movement for full session in the aquatic environment for use of buoyancy to offload joints and the viscosity of water as resistance during therapeutic exercise. Patient was able to tolerate all prescribed exercises in the aquatic environment with no adverse effects. Patient continues to benefit from skilled PT services on land and aquatic based and should be progressed as able to improve functional independence.     OBJECTIVE IMPAIRMENTS: decreased activity tolerance, decreased balance, difficulty walking, decreased ROM, decreased strength, obesity, and pain.   ACTIVITY LIMITATIONS: carrying, lifting, bending, sitting, standing, squatting, stairs, and locomotion level  PARTICIPATION LIMITATIONS: meal prep, cleaning, laundry, community activity, and church  PERSONAL FACTORS: High BMI, CHF, CAD, L TKR, degenerative arthritis are also affecting patient's functional outcome.   REHAB POTENTIAL: Good  CLINICAL DECISION MAKING: Evolving/moderate complexity  EVALUATION COMPLEXITY: Moderate   GOALS:  SHORT TERM GOALS: Target date: 09/04/22 Pt will be Ind in an initial HEP Baseline: started 09/14/22: can complete seated elliptical for 1 hour, 3-4 x per week, HEP 1 x per week.  Goal status: ONGOING  LONG TERM GOALS: Target date: 10/16/22  Pt will be Ind in a final HEP to maintain achieved LOF  Baseline: started Goal status: ONGOING  2.  Increase single standing for each LE to greater than 5 sec for improved functional mobility Baseline: 1" each LE 09/14/22: 9 sec Goal status: MET  3.  Increase bilat knee and hip strength by  at least 1/2 muscle strength grade for improved functional mobility  Baseline: see flow sheets Goal status: ONGOING  4.  Improve 5xSTS by MCID of 5" and by MCID of 72ft as indication of improved functional mobility  Baseline: 5xSTS c use of hands from bari-chair 23.9". 370" 09/14/22: 5 x STS 24.3 sec, 2 MWT 424 feet  Goal status: Partially met   5.  Pt's FOTO score will improved to the predicted value of 56% as indication of improved function  Baseline: 47% 09/14/22: 44% Goal status: ONGOING PLAN:  PT FREQUENCY: 2x/week  PT DURATION: 8 weeks  PLANNED INTERVENTIONS: Therapeutic exercises, Therapeutic activity, Neuromuscular re-education, Balance training, Gait training, Patient/Family education, Self Care, Joint mobilization, Aquatic Therapy, Dry Needling, Electrical stimulation, Cryotherapy, Moist heat, Taping, Vasopneumatic device, Ultrasound, Ionotophoresis 4mg /ml Dexamethasone, Manual therapy, and Re-evaluation  PLAN FOR NEXT SESSION: ; assess response to HEP; progress therex as indicated; use of modalities, manual therapy; and TPDN as indicated; update HEP for land (gastroc stretch, gluteal strengt, STS  Berta Minor PTA 09/18/22 12:21 PM Phone: [phone] Fax: [phone]

## 2022-09-18 ENCOUNTER — Ambulatory Visit: Payer: Medicare Other

## 2022-09-18 DIAGNOSIS — M6281 Muscle weakness (generalized): Secondary | ICD-10-CM | POA: Diagnosis not present

## 2022-09-18 DIAGNOSIS — G8929 Other chronic pain: Secondary | ICD-10-CM | POA: Diagnosis not present

## 2022-09-18 DIAGNOSIS — R262 Difficulty in walking, not elsewhere classified: Secondary | ICD-10-CM | POA: Diagnosis not present

## 2022-09-18 DIAGNOSIS — M25561 Pain in right knee: Secondary | ICD-10-CM | POA: Diagnosis not present

## 2022-09-18 DIAGNOSIS — M25562 Pain in left knee: Secondary | ICD-10-CM | POA: Diagnosis not present

## 2022-09-21 ENCOUNTER — Ambulatory Visit: Payer: Medicare Other | Admitting: Physical Therapy

## 2022-09-21 ENCOUNTER — Encounter: Payer: Self-pay | Admitting: Physical Therapy

## 2022-09-21 DIAGNOSIS — M6281 Muscle weakness (generalized): Secondary | ICD-10-CM | POA: Diagnosis not present

## 2022-09-21 DIAGNOSIS — G8929 Other chronic pain: Secondary | ICD-10-CM

## 2022-09-21 DIAGNOSIS — M25562 Pain in left knee: Secondary | ICD-10-CM | POA: Diagnosis not present

## 2022-09-21 DIAGNOSIS — M25561 Pain in right knee: Secondary | ICD-10-CM | POA: Diagnosis not present

## 2022-09-21 DIAGNOSIS — R262 Difficulty in walking, not elsewhere classified: Secondary | ICD-10-CM | POA: Diagnosis not present

## 2022-09-21 NOTE — Therapy (Signed)
OUTPATIENT PHYSICAL THERAPY TREATMENT NOTE   Patient Name: Heather [name] MRN: [id number] DOB:11/20/1945, 77 y.o., female Today's Date: 09/21/2022  END OF SESSION:  PT End of Session - 09/21/22 1152     Visit Number 9    Number of Visits 17    Date for PT Re-Evaluation 10/16/22    Authorization Type MEDICARE PART A AND B    PT Start Time 1150    PT Stop Time 1230    PT Time Calculation (min) 40 min             Past Medical History:  Diagnosis Date   Abdominal hernia    Abnormal Pap smear    ALLERGIC RHINITIS 10/22/2006   Allergy    ANEMIA 12/18/2008   Arthritis    scoliosis moderate deg changes lumbar Xray 11/04/17    Asthma    ASYMPTOMATIC POSTMENOPAUSAL STATUS 11/22/2007   Back pain    CAD (coronary artery disease)    in LAD   CHF (congestive heart failure) (HCC)    Complication of anesthesia 2010   pt states she woke up during colonoscopy and was aware of "losing control of breathing" during mammoplasty   Degenerative arthritis    Diverticulosis    Dyslipidemia    Fibroid    Frequent headaches    GERD 07/20/2007   GOITER, MULTINODULAR 07/20/2007   Headache(784.0) 07/20/2007   HEARING LOSS 11/22/2007   Hiatal hernia    Hiatal hernia    large   History of chicken pox    Hx of colposcopy with cervical biopsy    HYPERCHOLESTEROLEMIA 01/13/2007   Hyperglycemia    HYPERTENSION 10/22/2006   Lung nodule    unchanged since 05/26/13    Migraines    NASH (nonalcoholic steatohepatitis)    Nocturnal hypoxemia 02/17/2013   Obesity    Obstructive sleep apnea    OSA on CPAP    per neurology   OSTEOARTHRITIS 10/22/2006   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   Pre-diabetes    Sedimentation rate elevation    Sleep apnea    on cpap   Urine incontinence    UTI (urinary tract infection)    Past Surgical History:  Procedure Laterality Date   BREAST SURGERY  1984   Breast reduction b/l    CATARACT EXTRACTION, BILATERAL     DEXA  08/2005    DILATION AND CURETTAGE OF UTERUS     ELECTROCARDIOGRAM  10/15/2006   ESOPHAGOGASTRODUODENOSCOPY  12/08/2005   FOOT SURGERY     hammertoe and bunion 05/2017    Stress Cardiolite  10/21/2005   sweat gland removal     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   Procedure: Left TOTAL KNEE ARTHROPLASTY;  Surgeon: Kathryne Hitch, MD;  Location: [facility] OR;  Service: Orthopedics;  Laterality: Left;   UMBILICAL HERNIA REPAIR N/A 04/25/2022   Procedure: OPEN INCARCERATED HERNIA REPAIR UMBILICAL WITH APPENDECTOMY;  Surgeon: Quentin Ore, MD;  Location: [facility] OR;  Service: General;  Laterality: N/A;   WISDOM TOOTH EXTRACTION     Patient Active Problem List   Diagnosis Date Noted   Urge incontinence 08/04/2022   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 06/16/2022   Hypotension 05/26/2022   BMI 34.0-34.9,adult 05/06/2022   Obesity, Beginning BMI 44.60 05/06/2022   SBO (small bowel obstruction) (HCC) 04/25/2022   Obstructive sleep apnea treated with BiPAP 04/25/2022   Dependence on nocturnal oxygen therapy 04/25/2022   Hyponatremia 04/25/2022   Generalized abdominal  pain 04/23/2022   BMI 36.0-36.9,adult 04/08/2022   Obesity, Beginning BMI 44.60 04/08/2022   Persistent cough for 3 weeks or longer 02/13/2022   Type 2 diabetes mellitus with other specified complication (HCC) 12/10/2021   Traumatic tear of supraspinatus tendon of right shoulder 11/24/2021   Right arm pain 11/18/2021   Positive self-administered antigen test for COVID-19 11/09/2021   Stress 10/16/2021   Status post total left knee replacement 03/11/2021   Chronic pain 01/06/2021   Lumbar radiculopathy 07/04/2020   Bilateral sciatica 07/04/2020   Pain in both hands 07/04/2020   Hyperlipidemia associated with type 2 diabetes mellitus (HCC) 05/01/2020   Abnormal blood level of copper 03/14/2020   Leukopenia 03/14/2020   Obesity (BMI 30-39.9) 11/13/2019   Chronic pain of right thumb  10/11/2019   Vitamin D deficiency 07/27/2019   Memory loss 04/27/2019   Mass of left wrist 01/18/2019   Ventral hernia without obstruction or gangrene 01/18/2019   Mass of right thigh 09/13/2018   Depression 08/02/2018   Sleeps in sitting position due to orthopnea 07/13/2018   BiPAP (biphasic positive airway pressure) dependence 05/24/2018   Goiter 04/15/2017   Urinary incontinence 04/15/2017   Type 2 diabetes mellitus without complication, without long-term current use of insulin (HCC) 02/15/2017   Bilateral leg cramps 01/05/2017   Peripheral neuropathy 05/27/2016   Elevated sed rate 03/05/2016   Muscle spasm of both lower legs 02/12/2016   Leg edema 02/12/2016   Iron deficiency anemia 02/12/2016   Hypokalemia 02/12/2016   Pernicious anemia 02/12/2016   Degenerative disc disease, lumbar 04/09/2015   Degenerative arthritis of knee, bilateral 03/13/2015   Chronic low back pain 03/13/2015   CAD S/P percutaneous coronary angioplasty 10/31/2013   Incidental lung nodule, > 3mm and < 8mm, Left lower lobe 7mm - June 2014, April 2015 unchanged 06/25/2013   OSA on CPAP 06/21/2013   Myalgia and myositis 05/02/2013   Muscle ache 04/03/2013   Class 3 severe obesity with serious comorbidity and body mass index (BMI) of 40.0 to 44.9 in adult (HCC) 01/24/2013   Large hiatal hernia 08/24/2012    LLL nodule 6mm - June 2014, described in 2003 07/13/2012   Asthma, chronic 06/29/2012   Migraine without aura 03/11/2012   SOB (shortness of breath) 12/28/2011   Hx of fibroids 08/03/2011   Knee pain, left 12/25/2010   NECK PAIN 12/03/2009   TINNITUS 11/22/2007   HEARING LOSS 11/22/2007   Chronic pain of both knees 11/22/2007   Gastroesophageal reflux disease without esophagitis 07/20/2007   NASH (nonalcoholic steatohepatitis) 07/20/2007   ANXIETY 10/22/2006   Allergic rhinitis 10/22/2006   Osteoarthritis 10/22/2006    PCP: Pincus Sanes, MD  REFERRING PROVIDER: Judi Saa, DO    REFERRING DIAG: M25.561,M25.562,G89.29 (ICD-10-CM) - Chronic pain of both knees   THERAPY DIAG:  Chronic pain of left knee  Chronic pain of right knee  Muscle weakness (generalized)  Difficulty in walking, not elsewhere classified  Rationale for Evaluation and Treatment: Rehabilitation  ONSET DATE: 5 months  SUBJECTIVE:   SUBJECTIVE STATEMENT: Patient reports pain level is low now because it is earlier in the day. The cold lobby increased her knee pain.    PERTINENT HISTORY: High BMI, CHF, CAD, L TKR, degenerative arthritis  PAIN:  Are you having pain? Yes: NPRS scale: 2/10 Pain location: Both knees Pain description: Tightness/achiness Aggravating factors: Cold, extended feet, after periods of rest Relieving factors: Occasional use of pain medication, seated elliptical Normal Range: 3-6/10  PRECAUTIONS: None  WEIGHT BEARING RESTRICTIONS: No  FALLS:  Has patient fallen in last 6 months? 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, reads, church-sings in choir  PLOF: Independent  PATIENT GOALS: Pt be Ind c better quality of movement  NEXT MD VISIT: Next week  OBJECTIVE:   DIAGNOSTIC FINDINGS:  DG L knee 07/10/22 L knee Narrative: Well-seated prosthesis without complication   PATIENT SURVEYS:  FOTO: Perceived function   47%, predicted   56%  09/14/22: 43%  COGNITION: Overall cognitive status: Within functional limits for tasks assessed     SENSATION: WFL  EDEMA:  Swelling of both legs c pitting edema of ankle at sock line  MUSCLE LENGTH: Hamstrings: Right 40 deg; Left 40 deg Thomas test: Right Tight deg; Left tight deg  POSTURE: rounded shoulders, forward head, and increased thoracic  kyphosis  PALPATION: Not overly TTP of the knees  LOWER EXTREMITY ROM:  Active ROM Right eval Left eval Right 09/14/22 Left 09/14/22  Hip flexion      Hip extension      Hip abduction      Hip adduction      Hip internal rotation      Hip external rotation      Knee flexion 95 100 98 104  Knee extension 10 0    Ankle dorsiflexion      Ankle plantarflexion      Ankle inversion      Ankle eversion       (Blank rows = not tested)  LOWER EXTREMITY MMT:  MMT Right eval Left eval Right 09/14/22 Left  09/14/22  Hip flexion 4- 4- 4 4  Hip extension 3 3    Hip abduction 4- 4- 4 4-  Hip adduction      Hip internal rotation      Hip external rotation 4 4    Knee flexion 4+ 4+    Knee extension 5 5    Ankle dorsiflexion      Ankle plantarflexion      Ankle inversion      Ankle eversion       (Blank rows = not tested)  LOWER EXTREMITY SPECIAL TESTS:    For the R knee: Knee special tests: Anterior drawer test: negative, Posterior drawer test: negative, and McMurray's test: negative  FUNCTIONAL TESTS:  5 times sit to stand: 23.9 2 minute walk test: 370' 09/14/22: 5 x STS   24.3 sec with UE, bariatric chair 09/14/22: 2 MWT: 424 feet   Single leg stance: 1" 09/14/22: 9 sec SLS , each leg    GAIT: Distance walked: 370' Assistive device utilized: None Level of assistance: Complete Independence Comments: Slower pace    TODAY'S TREATMENT:  [facility] Adult PT Treatment:                                                DATE: 09/21/22 Therapeutic Exercise: SLR x 10 each  Hip abduction x 10 each  Gluteal squeeze with legs on exercise ball  Hamstring curls with feet on ball  Hip flexor stretch bilat  Standing hip abduction x 10 each  Standing heel  toe raises Gastroc stretch Forearms on counter hip extension x 10 each     [facility] Adult PT Treatment:                                                DATE: 09/17/22 Aquatic therapy at [facility]- [address] - therapeutic pool  temp approximately 92 degrees. Pt enters building ambulating without AD and aide of another person. Treatment took place in water 3.8 to  4 ft 8 in.feet deep depending upon activity.  Pt entered and exited the pool via stair and handrails independently. Pain at initiation of aquatic treatment 2/10  At end 0/10   Aquatic Exercise: Fwd/lateral/bkwds - 2 laps ea Walking lunge - 2 laps with yellow DB Gentle rotation with yellow dumbells - x1' Step ups on bottom step x10 each fwd/lat BIL Pool edge with UE support as needed:  Marching hip flexion to knee extension x20 BIL Heel/toe raises x 20 Ham curls x 20 Squats 2x20 Hip abd/add x20 BIL Hip ext/flex with knee straight x 20 BIL Hip Circles CC/CCW x10 each BIL   Pt requires the buoyancy of water for active assisted exercises with buoyancy supported for strengthening and AROM exercises. Hydrostatic pressure also supports joints by unweighting joint load by at least 50 % in 3-4 feet depth water. 80% in chest to neck deep water. Water will provide assistance with movement using the current and laminar flow while the buoyancy reduces weight bearing. Pt requires the viscosity of the water for resistance with strengthening exercises.  [facility] Adult PT Treatment:                                                DATE: 09/14/22 Therapeutic Exercise: SLR x 10 each  Hooklying vs supine gluteal sets  Standing hip abduction  x 10 each  Heel raises and toe raises x10 each SLS 9 sec each Tandem stance- increased back pain Heel slides with strap x 3 each  Supine hamstring /gastroc stretch with strap Right hip flexor hang- added strap for education Seated pelvic rocking STS from mat with cues for weight shift. X 4   Therapeutic Activity: 2 MWT 5 x STS     PATIENT EDUCATION:  Education details: Eval findings, POC, HEP, self care  Person educated: Patient Education method: Explanation, Demonstration, Tactile cues, Verbal cues, and Handouts Education  comprehension: verbalized understanding, returned demonstration, verbal cues required, and tactile cues required  HOME EXERCISE PROGRAM: Access Code: [email or web address] URL: [email or web address] Date: 08/12/2022 Prepared by: Joellyn Rued  Exercises - Active Straight Leg Raise with Quad Set  - 1 x daily - 7 x weekly - 2 sets - 10 reps - 3 hold - Hooklying Clamshell with Resistance  - 1 x daily - 7 x weekly - 2 sets - 10 reps - 3 hold - Sidelying Hip Abduction  - 1 x daily - 7 x weekly - 2 sets - 10 reps - 3 hold - Sit to Stand with Counter Support  - 1 x daily - 7 x weekly - 2 sets - 10 reps - 3 hold Added 09/21/22 - Supine Gluteus and Hamstring Sets on Swiss Ball  - 1 x daily - 7 x weekly - 2  sets - 10 reps - 5 hold - Prone Hip Extension on Table  - 1 x daily - 7 x weekly - 1-2 sets - 10 reps - Standing Gastroc Stretch at Counter  - 1 x daily - 7 x weekly - 1 sets - 2-3 reps - 20 hold  ASSESSMENT:  CLINICAL IMPRESSION: Patient presents to PT reporting continued BIL knee pain as well as full body pain that she says is low in the morning and gets worse in the afternoon/evening. She reports aquatic visit was good and she doesn't remember how she felt after the last land visit. Today, worked on LE strengthening, focusing on gluteal strength and updated HEP. She fatigues with repetitions and has difficulty with bed mobility due to gluteal weakness and lack of knee flexion. Patient continues to benefit from skilled PT services on land and aquatic based and should be progressed as able to improve functional independence.     OBJECTIVE IMPAIRMENTS: decreased activity tolerance, decreased balance, difficulty walking, decreased ROM, decreased strength, obesity, and pain.   ACTIVITY LIMITATIONS: carrying, lifting, bending, sitting, standing, squatting, stairs, and locomotion level  PARTICIPATION LIMITATIONS: meal prep, cleaning, laundry, community activity, and church  PERSONAL FACTORS: High  BMI, CHF, CAD, L TKR, degenerative arthritis are also affecting patient's functional outcome.   REHAB POTENTIAL: Good  CLINICAL DECISION MAKING: Evolving/moderate complexity  EVALUATION COMPLEXITY: Moderate   GOALS:  SHORT TERM GOALS: Target date: 09/04/22 Pt will be Ind in an initial HEP Baseline: started 09/14/22: can complete seated elliptical for 1 hour, 3-4 x per week, HEP 1 x per week.  Goal status: ONGOING  LONG TERM GOALS: Target date: 10/16/22  Pt will be Ind in a final HEP to maintain achieved LOF  Baseline: started Goal status: ONGOING  2.  Increase single standing for each LE to greater than 5 sec for improved functional mobility Baseline: 1" each LE 09/14/22: 9 sec Goal status: MET  3.  Increase bilat knee and hip strength by at least 1/2 muscle strength grade for improved functional mobility  Baseline: see flow sheets Goal status: ONGOING  4.  Improve 5xSTS by MCID of 5" and by MCID of 53ft as indication of improved functional mobility  Baseline: 5xSTS c use of hands from bari-chair 23.9". 370" 09/14/22: 5 x STS 24.3 sec, 2 MWT 424 feet  Goal status: Partially met   5.  Pt's FOTO score will improved to the predicted value of 56% as indication of improved function  Baseline: 47% 09/14/22: 44% Goal status: ONGOING PLAN:  PT FREQUENCY: 2x/week  PT DURATION: 8 weeks  PLANNED INTERVENTIONS: Therapeutic exercises, Therapeutic activity, Neuromuscular re-education, Balance training, Gait training, Patient/Family education, Self Care, Joint mobilization, Aquatic Therapy, Dry Needling, Electrical stimulation, Cryotherapy, Moist heat, Taping, Vasopneumatic device, Ultrasound, Ionotophoresis 4mg /ml Dexamethasone, Manual therapy, and Re-evaluation  PLAN FOR NEXT SESSION: ; assess response to HEP; progress therex as indicated; use of modalities, manual therapy; and TPDN as indicated;  Jannette Spanner, PTA 09/21/22 12:59 PM Phone: [phone] Fax: [phone]

## 2022-09-22 ENCOUNTER — Encounter: Payer: Self-pay | Admitting: Urology

## 2022-09-22 ENCOUNTER — Ambulatory Visit (INDEPENDENT_AMBULATORY_CARE_PROVIDER_SITE_OTHER): Payer: Medicare Other | Admitting: Urology

## 2022-09-22 VITALS — BP 123/59 | HR 60 | Ht 64.6 in | Wt 210.0 lb

## 2022-09-22 DIAGNOSIS — N3941 Urge incontinence: Secondary | ICD-10-CM

## 2022-09-22 LAB — URINALYSIS, ROUTINE W REFLEX MICROSCOPIC
Bilirubin, UA: NEGATIVE
Glucose, UA: NEGATIVE
Ketones, UA: NEGATIVE
Nitrite, UA: NEGATIVE
Protein,UA: NEGATIVE
RBC, UA: NEGATIVE
Specific Gravity, UA: 1.02 (ref 1.005–1.030)
Urobilinogen, Ur: 0.2 mg/dL (ref 0.2–1.0)
pH, UA: 7.5 (ref 5.0–7.5)

## 2022-09-22 LAB — MICROSCOPIC EXAMINATION: RBC, Urine: NONE SEEN /hpf (ref 0–2)

## 2022-09-22 LAB — BLADDER SCAN AMB NON-IMAGING

## 2022-09-22 NOTE — Progress Notes (Signed)
Assessment: 1. Urge incontinence     Plan: Continue bladder diet  Continue Solifenacin 10 mg daily.   Timed and double voiding to make sure bladder empties completely Return to office in 3 months  Chief Complaint:  Chief Complaint  Patient presents with   Urinary Incontinence    History of Present Illness:  Heather [name] is a 77 y.o. female who is seen for further evaluation of urge incontinence.  She has had symptoms for >1 year.  She has frequency, urgency, and urge incontinence.  No nocturia or nocturnal incontinence.  She is wearing adult diapers daily, changing at least 1 time/day.  No dysuria or gross hematuria.  She had been on trospium 20 mg BID without improvement in her symptoms.  She had not tried any other medical therapy.   No recent UTI's.   No fecal incontinence.  No significant problems with constipation. She was given a trial of Solifenacin 10 mg daily at her visit in June 2024. Urine culture grew 25-50 K of group B strep. PVR = 4 ml  Portions of the above documentation were copied from a prior visit for review purposes only.   Past Medical History:  Past Medical History:  Diagnosis Date   Abdominal hernia    Abnormal Pap smear    ALLERGIC RHINITIS 10/22/2006   Allergy    ANEMIA 12/18/2008   Arthritis    scoliosis moderate deg changes lumbar Xray 11/04/17    Asthma    ASYMPTOMATIC POSTMENOPAUSAL STATUS 11/22/2007   Back pain    CAD (coronary artery disease)    in LAD   CHF (congestive heart failure) (HCC)    Complication of anesthesia 2010   pt states she woke up during colonoscopy and was aware of "losing control of breathing" during mammoplasty   Degenerative arthritis    Diverticulosis    Dyslipidemia    Fibroid    Frequent headaches    GERD 07/20/2007   GOITER, MULTINODULAR 07/20/2007   Headache(784.0) 07/20/2007   HEARING LOSS 11/22/2007   Hiatal hernia    Hiatal hernia    large   History of chicken pox    Hx of colposcopy with  cervical biopsy    HYPERCHOLESTEROLEMIA 01/13/2007   Hyperglycemia    HYPERTENSION 10/22/2006   Lung nodule    unchanged since 05/26/13    Migraines    NASH (nonalcoholic steatohepatitis)    Nocturnal hypoxemia 02/17/2013   Obesity    Obstructive sleep apnea    OSA on CPAP    per neurology   OSTEOARTHRITIS 10/22/2006   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   Pre-diabetes    Sedimentation rate elevation    Sleep apnea    on cpap   Urine incontinence    UTI (urinary tract infection)     Past Surgical History:  Past Surgical History:  Procedure Laterality Date   BREAST SURGERY  1984   Breast reduction b/l    CATARACT EXTRACTION, BILATERAL     DEXA  08/2005   DILATION AND CURETTAGE OF UTERUS     ELECTROCARDIOGRAM  10/15/2006   ESOPHAGOGASTRODUODENOSCOPY  12/08/2005   FOOT SURGERY     hammertoe and bunion 05/2017    Stress Cardiolite  10/21/2005   sweat gland removal     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   Procedure: Left TOTAL KNEE ARTHROPLASTY;  Surgeon: Kathryne Hitch, MD;  Location: [facility] OR;  Service: Orthopedics;  Laterality: Left;   UMBILICAL HERNIA REPAIR N/A 04/25/2022  Procedure: OPEN INCARCERATED HERNIA REPAIR UMBILICAL WITH APPENDECTOMY;  Surgeon: Quentin Ore, MD;  Location: [facility] OR;  Service: General;  Laterality: N/A;   WISDOM TOOTH EXTRACTION      Allergies:  Allergies  Allergen Reactions   Aspirin Hives   Coconut (Cocos Nucifera) Hives   Lisinopril Cough   Metoprolol Itching   Other     Latex paint, the smell causes hives. Not allergic to latex gloves   General anesthesia - has had issues in past where anesthesia was not properly administered and caused side effects   Peanut-Containing Drug Products Hives, Itching and Other (See Comments)   Prednisone     Unknown reaction    Strawberry Extract Hives   Penicillins Rash    Family History:  Family History  Problem Relation Age of Onset   Asthma Mother    Depression  Mother    Bipolar disorder Mother    Dementia Mother    Arthritis Mother    Breast cancer Mother        primary   Colon cancer Mother        mets from breast   Cancer Mother        colon   Hypertension Father    Migraines Father    Cancer Maternal Aunt        ?   Heart disease Maternal Grandmother    Cancer Maternal Grandfather        stomach ?   Sleep apnea Neg Hx    Esophageal cancer Neg Hx    Stomach cancer Neg Hx    Rectal cancer Neg Hx     Social History:  Social History   Tobacco Use   Smoking status: Former    Current packs/day: 0.00    Average packs/day: 2.0 packs/day for 20.0 years (40.0 ttl pk-yrs)    Types: Cigarettes    Start date: 03/03/1959    Quit date: 03/03/1979    Years since quitting: 43.5   Smokeless tobacco: Never  Vaping Use   Vaping status: Never Used  Substance Use Topics   Alcohol use: Yes    Comment: rare - TWICE A YR   Drug use: No    ROS: Constitutional:  Negative for fever, chills, weight loss CV: Negative for chest pain, previous MI, hypertension Respiratory:  Negative for shortness of breath, wheezing, sleep apnea, frequent cough GI:  Negative for nausea, vomiting, bloody stool, GERD  Physical exam: BP (!) 123/59   Pulse 60   Ht 5' 4.6" (1.641 m)   Wt 210 lb (95.3 kg)   BMI 35.38 kg/m  GENERAL APPEARANCE:  Well appearing, well developed, well nourished, NAD HEENT:  Atraumatic, normocephalic, oropharynx clear NECK:  Supple without lymphadenopathy or thyromegaly ABDOMEN:  Soft, non-tender, no masses EXTREMITIES:  Moves all extremities well, without clubbing, cyanosis, or edema NEUROLOGIC:  Alert and oriented x 3, normal gait, CN II-XII grossly intact MENTAL STATUS:  appropriate BACK:  Non-tender to palpation, No CVAT SKIN:  Warm, dry, and intact  Results: U/A:  0-5 WBC, many bacteria  PVR = 97 ml

## 2022-09-24 ENCOUNTER — Ambulatory Visit: Payer: Medicare Other | Admitting: Physical Therapy

## 2022-09-24 ENCOUNTER — Encounter: Payer: Self-pay | Admitting: Physical Therapy

## 2022-09-24 DIAGNOSIS — M6281 Muscle weakness (generalized): Secondary | ICD-10-CM

## 2022-09-24 DIAGNOSIS — R262 Difficulty in walking, not elsewhere classified: Secondary | ICD-10-CM

## 2022-09-24 DIAGNOSIS — M25562 Pain in left knee: Secondary | ICD-10-CM | POA: Diagnosis not present

## 2022-09-24 DIAGNOSIS — G8929 Other chronic pain: Secondary | ICD-10-CM

## 2022-09-24 DIAGNOSIS — M25561 Pain in right knee: Secondary | ICD-10-CM | POA: Diagnosis not present

## 2022-09-24 NOTE — Therapy (Signed)
Progress Note Reporting Period 6/12 to 7/25  See note below for Objective Data and Assessment of Progress/Goals.       Patient Name: Heather [name] MRN: [id number] DOB:[birth year], 77 y.o., female Today's Date: 09/24/2022  END OF SESSION:  PT End of Session - 09/24/22 1358     Visit Number 10    Number of Visits 17    Date for PT Re-Evaluation 10/16/22    Authorization Type MEDICARE PART A AND B    Progress Note Due on Visit 10    PT Start Time 1400    PT Stop Time 1445    PT Time Calculation (min) 45 min              Past Medical History:  Diagnosis Date   Abdominal hernia    Abnormal Pap smear    ALLERGIC RHINITIS 10/22/2006   Allergy    ANEMIA 12/18/2008   Arthritis    scoliosis moderate deg changes lumbar Xray 11/04/17    Asthma    ASYMPTOMATIC POSTMENOPAUSAL STATUS 11/22/2007   Back pain    CAD (coronary artery disease)    in LAD   CHF (congestive heart failure) (HCC)    Complication of anesthesia 2010   pt states she woke up during colonoscopy and was aware of "losing control of breathing" during mammoplasty   Degenerative arthritis    Diverticulosis    Dyslipidemia    Fibroid    Frequent headaches    GERD 07/20/2007   GOITER, MULTINODULAR 07/20/2007   Headache(784.0) 07/20/2007   HEARING LOSS 11/22/2007   Hiatal hernia    Hiatal hernia    large   History of chicken pox    Hx of colposcopy with cervical biopsy    HYPERCHOLESTEROLEMIA 01/13/2007   Hyperglycemia    HYPERTENSION 10/22/2006   Lung nodule    unchanged since 05/26/13    Migraines    NASH (nonalcoholic steatohepatitis)    Nocturnal hypoxemia 02/17/2013   Obesity    Obstructive sleep apnea    OSA on CPAP    per neurology   OSTEOARTHRITIS 10/22/2006   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   Pre-diabetes    Sedimentation rate elevation    Sleep apnea    on cpap   Urine incontinence    UTI (urinary tract infection)    Past Surgical History:  Procedure  Laterality Date   BREAST SURGERY  1984   Breast reduction b/l    CATARACT EXTRACTION, BILATERAL     DEXA  08/2005   DILATION AND CURETTAGE OF UTERUS     ELECTROCARDIOGRAM  10/15/2006   ESOPHAGOGASTRODUODENOSCOPY  12/08/2005   FOOT SURGERY     hammertoe and bunion 05/2017    Stress Cardiolite  10/21/2005   sweat gland removal     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   Procedure: Left TOTAL KNEE ARTHROPLASTY;  Surgeon: Kathryne Hitch, MD;  Location: [facility] OR;  Service: Orthopedics;  Laterality: Left;   UMBILICAL HERNIA REPAIR N/A 04/25/2022   Procedure: OPEN INCARCERATED HERNIA REPAIR UMBILICAL WITH APPENDECTOMY;  Surgeon: Dossie Der Hyman Hopes, MD;  Location: [facility] OR;  Service: General;  Laterality: N/A;   WISDOM TOOTH EXTRACTION     Patient Active Problem List   Diagnosis Date Noted   Urge incontinence 08/04/2022   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 06/16/2022   Hypotension 05/26/2022   BMI 34.0-34.9,adult 05/06/2022   Obesity, Beginning BMI 44.60 05/06/2022   SBO (small bowel obstruction) (  HCC) 04/25/2022   Obstructive sleep apnea treated with BiPAP 04/25/2022   Dependence on nocturnal oxygen therapy 04/25/2022   Hyponatremia 04/25/2022   Generalized abdominal pain 04/23/2022   BMI 36.0-36.9,adult 04/08/2022   Obesity, Beginning BMI 44.60 04/08/2022   Persistent cough for 3 weeks or longer 02/13/2022   Type 2 diabetes mellitus with other specified complication (HCC) 12/10/2021   Traumatic tear of supraspinatus tendon of right shoulder 11/24/2021   Right arm pain 11/18/2021   Positive self-administered antigen test for COVID-19 11/09/2021   Stress 10/16/2021   Status post total left knee replacement 03/11/2021   Chronic pain 01/06/2021   Lumbar radiculopathy 07/04/2020   Bilateral sciatica 07/04/2020   Pain in both hands 07/04/2020   Hyperlipidemia associated with type 2 diabetes mellitus (HCC) 05/01/2020   Abnormal blood  level of copper 03/14/2020   Leukopenia 03/14/2020   Obesity (BMI 30-39.9) 11/13/2019   Chronic pain of right thumb 10/11/2019   Vitamin D deficiency 07/27/2019   Memory loss 04/27/2019   Mass of left wrist 01/18/2019   Ventral hernia without obstruction or gangrene 01/18/2019   Mass of right thigh 09/13/2018   Depression 08/02/2018   Sleeps in sitting position due to orthopnea 07/13/2018   BiPAP (biphasic positive airway pressure) dependence 05/24/2018   Goiter 04/15/2017   Urinary incontinence 04/15/2017   Type 2 diabetes mellitus without complication, without long-term current use of insulin (HCC) 02/15/2017   Bilateral leg cramps 01/05/2017   Peripheral neuropathy 05/27/2016   Elevated sed rate 03/05/2016   Muscle spasm of both lower legs 02/12/2016   Leg edema 02/12/2016   Iron deficiency anemia 02/12/2016   Hypokalemia 02/12/2016   Pernicious anemia 02/12/2016   Degenerative disc disease, lumbar 04/09/2015   Degenerative arthritis of knee, bilateral 03/13/2015   Chronic low back pain 03/13/2015   CAD S/P percutaneous coronary angioplasty 10/31/2013   Incidental lung nodule, > 3mm and < 8mm, Left lower lobe 7mm - June 2014, April 2015 unchanged 06/25/2013   OSA on CPAP 06/21/2013   Myalgia and myositis 05/02/2013   Muscle ache 04/03/2013   Class 3 severe obesity with serious comorbidity and body mass index (BMI) of 40.0 to 44.9 in adult (HCC) 01/24/2013   Large hiatal hernia 08/24/2012    LLL nodule 6mm - June 2014, described in 2003 07/13/2012   Asthma, chronic 06/29/2012   Migraine without aura 03/11/2012   SOB (shortness of breath) 12/28/2011   Hx of fibroids 08/03/2011   Knee pain, left 12/25/2010   NECK PAIN 12/03/2009   TINNITUS 11/22/2007   HEARING LOSS 11/22/2007   Chronic pain of both knees 11/22/2007   Gastroesophageal reflux disease without esophagitis 07/20/2007   NASH (nonalcoholic steatohepatitis) 07/20/2007   ANXIETY 10/22/2006   Allergic rhinitis  10/22/2006   Osteoarthritis 10/22/2006    PCP: Pincus Sanes, MD  REFERRING PROVIDER: Judi Saa, DO   REFERRING DIAG: M25.561,M25.562,G89.29 (ICD-10-CM) - Chronic pain of both knees   THERAPY DIAG:  Chronic pain of left knee  Chronic pain of right knee  Muscle weakness (generalized)  Difficulty in walking, not elsewhere classified  Rationale for Evaluation and Treatment: Rehabilitation  ONSET DATE: 5 months  SUBJECTIVE:   SUBJECTIVE STATEMENT: Pt reports some increased stiffness after being very active at her church conference and d/t the weather outside.   PERTINENT HISTORY: High BMI, CHF, CAD, L TKR, degenerative arthritis  PAIN:  Are you having pain? Yes: NPRS scale: 2/10 Pain location: Both knees Pain description: Tightness/achiness Aggravating  factors: Cold, extended feet, after periods of rest Relieving factors: Occasional use of pain medication, seated elliptical Normal Range: 3-6/10  PRECAUTIONS: None  WEIGHT BEARING RESTRICTIONS: No  FALLS:  Has patient fallen in last 6 months? 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, reads, church-sings in choir  PLOF: Independent  PATIENT GOALS: Pt be Ind c better quality of movement  NEXT MD VISIT: Next week  OBJECTIVE:   DIAGNOSTIC FINDINGS:  DG L knee 07/10/22 L knee Narrative: Well-seated prosthesis without complication   PATIENT SURVEYS:  FOTO: Perceived function   47%, predicted   56%  09/14/22: 43%  COGNITION: Overall cognitive status: Within functional limits for tasks assessed     SENSATION: WFL  EDEMA:  Swelling of both legs c pitting edema of ankle at sock line  MUSCLE LENGTH: Hamstrings: Right 40 deg; Left 40  deg Thomas test: Right Tight deg; Left tight deg  POSTURE: rounded shoulders, forward head, and increased thoracic kyphosis  PALPATION: Not overly TTP of the knees  LOWER EXTREMITY ROM:  Active ROM Right eval Left eval Right 09/14/22 Left 09/14/22  Hip flexion      Hip extension      Hip abduction      Hip adduction      Hip internal rotation      Hip external rotation      Knee flexion 95 100 98 104  Knee extension 10 0    Ankle dorsiflexion      Ankle plantarflexion      Ankle inversion      Ankle eversion       (Blank rows = not tested)  LOWER EXTREMITY MMT:  MMT Right eval Left eval Right 09/14/22 Left  09/14/22  Hip flexion 4- 4- 4 4  Hip extension 3 3    Hip abduction 4- 4- 4 4-  Hip adduction      Hip internal rotation      Hip external rotation 4 4    Knee flexion 4+ 4+    Knee extension 5 5    Ankle dorsiflexion      Ankle plantarflexion      Ankle inversion      Ankle eversion       (Blank rows = not tested)  LOWER EXTREMITY SPECIAL TESTS:    For the R knee: Knee special tests: Anterior drawer test: negative, Posterior drawer test: negative, and McMurray's test: negative  FUNCTIONAL TESTS:  5 times sit to stand: 23.9 2 minute walk test: 370' 09/14/22: 5 x STS   24.3 sec with UE, bariatric chair 09/14/22: 2 MWT: 424 feet   Single leg stance: 1" 09/14/22: 9 sec SLS , each leg    GAIT: Distance walked: 370' Assistive device utilized: None Level of assistance: Complete Independence Comments: Slower pace    TODAY'S TREATMENT:   [facility] Adult PT Treatment:                                                DATE: 09/24/22 Aquatic therapy at [facility]- [address] - therapeutic pool temp approximately 92 degrees. Pt enters building  ambulating without AD and aide of another person. Treatment took place in water 3.8 to  4 ft 8 in.feet deep depending upon activity.  Pt entered and exited the pool via stair and handrails independently. Pain at  initiation of aquatic treatment 2/10  At end 0/10    Aquatic Exercise: Fwd/lateral/bkwds - 2 laps ea Fwd crossover walking Alternating crossover walking Tandem walking with rainbow dumbells Reverse lunge with dumbells - x10 ea Gentle rotation with yellow dumbells - x1' Step ups on bottom step x10 each fwd/lat BIL SLS with rainbow DB Pool edge with UE support as needed:  Ham curls x 20 Squats 2x20 Hip abd/add x20 BIL     Pt requires the buoyancy of water for active assisted exercises with buoyancy supported for strengthening and AROM exercises. Hydrostatic pressure also supports joints by unweighting joint load by at least 50 % in 3-4 feet depth water. 80% in chest to neck deep water. Water will provide assistance with movement using the current and laminar flow while the buoyancy reduces weight bearing. Pt requires the viscosity of the water for resistance with strengthening exercises.  [facility] Adult PT Treatment:                                                DATE: 09/21/22 Therapeutic Exercise: SLR x 10 each  Hip abduction x 10 each  Gluteal squeeze with legs on exercise ball  Hamstring curls with feet on ball  Hip flexor stretch bilat  Standing hip abduction x 10 each  Standing heel toe raises Gastroc stretch Forearms on counter hip extension x 10 each     [facility] Adult PT Treatment:                                                DATE: 09/17/22 Aquatic therapy at [facility]- [address] - therapeutic pool temp approximately 92 degrees. Pt enters building ambulating without AD and aide of another person. Treatment took place in water 3.8 to  4 ft 8 in.feet deep depending upon activity.  Pt entered and exited the pool via stair and handrails independently. Pain at initiation of aquatic treatment 2/10  At end 0/10   Aquatic Exercise: Fwd/lateral/bkwds - 2 laps ea Walking lunge - 2 laps with yellow DB Gentle rotation with yellow dumbells - x1' Step ups on bottom step x10 each  fwd/lat BIL Pool edge with UE support as needed:  Marching hip flexion to knee extension x20 BIL Heel/toe raises x 20 Ham curls x 20 Squats 2x20 Hip abd/add x20 BIL Hip ext/flex with knee straight x 20 BIL Hip Circles CC/CCW x10 each BIL   Pt requires the buoyancy of water for active assisted exercises with buoyancy supported for strengthening and AROM exercises. Hydrostatic pressure also supports joints by unweighting joint load by at least 50 % in 3-4 feet depth water. 80% in chest to neck deep water. Water will provide assistance with movement using the current and laminar flow while the buoyancy reduces weight bearing. Pt requires the viscosity of the water for resistance with strengthening exercises.  [facility] Adult PT Treatment:  DATE: 09/14/22 Therapeutic Exercise: SLR x 10 each  Hooklying vs supine gluteal sets  Standing hip abduction  x 10 each  Heel raises and toe raises x10 each SLS 9 sec each Tandem stance- increased back pain Heel slides with strap x 3 each  Supine hamstring /gastroc stretch with strap Right hip flexor hang- added strap for education Seated pelvic rocking STS from mat with cues for weight shift. X 4   Therapeutic Activity: 2 MWT 5 x STS     PATIENT EDUCATION:  Education details: Eval findings, POC, HEP, self care  Person educated: Patient Education method: Explanation, Demonstration, Tactile cues, Verbal cues, and Handouts Education comprehension: verbalized understanding, returned demonstration, verbal cues required, and tactile cues required  HOME EXERCISE PROGRAM: Access Code: [email or web address] URL: [email or web address] Date: 08/12/2022 Prepared by: Joellyn Rued  Exercises - Active Straight Leg Raise with Quad Set  - 1 x daily - 7 x weekly - 2 sets - 10 reps - 3 hold - Hooklying Clamshell with Resistance  - 1 x daily - 7 x weekly - 2 sets - 10 reps - 3 hold - Sidelying Hip Abduction  - 1 x  daily - 7 x weekly - 2 sets - 10 reps - 3 hold - Sit to Stand with Counter Support  - 1 x daily - 7 x weekly - 2 sets - 10 reps - 3 hold Added 09/21/22 - Supine Gluteus and Hamstring Sets on Swiss Ball  - 1 x daily - 7 x weekly - 2 sets - 10 reps - 5 hold - Prone Hip Extension on Table  - 1 x daily - 7 x weekly - 1-2 sets - 10 reps - Standing Gastroc Stretch at Counter  - 1 x daily - 7 x weekly - 1 sets - 2-3 reps - 20 hold  ASSESSMENT:  CLINICAL IMPRESSION: Desiray has progressed well with therapy.  Improved impairments include: LE strength, pain.  Functional improvements include: transfers, ambulation distance, ability to participate in community activities at church.  Progressions needed include: continued work on functional strength and endurance.  Barriers to progress include: NA.  Please see GOALS section for progress on short term and long term goals established at evaluation.  I recommend continuation of PT to allow completion of remaining goals and continued functional progression.    OBJECTIVE IMPAIRMENTS: decreased activity tolerance, decreased balance, difficulty walking, decreased ROM, decreased strength, obesity, and pain.   ACTIVITY LIMITATIONS: carrying, lifting, bending, sitting, standing, squatting, stairs, and locomotion level  PARTICIPATION LIMITATIONS: meal prep, cleaning, laundry, community activity, and church  PERSONAL FACTORS: High BMI, CHF, CAD, L TKR, degenerative arthritis are also affecting patient's functional outcome.   REHAB POTENTIAL: Good  CLINICAL DECISION MAKING: Evolving/moderate complexity  EVALUATION COMPLEXITY: Moderate   GOALS:  SHORT TERM GOALS: Target date: 09/04/22 Pt will be Ind in an initial HEP Baseline: started 09/14/22: can complete seated elliptical for 1 hour, 3-4 x per week, HEP 1 x per week.  Goal status: ONGOING  LONG TERM GOALS: Target date: 10/16/22  Pt will be Ind in a final HEP to maintain achieved LOF  Baseline: started Goal  status: ONGOING  2.  Increase single standing for each LE to greater than 5 sec for improved functional mobility Baseline: 1" each LE 09/14/22: 9 sec Goal status: MET  3.  Increase bilat knee and hip strength by at least 1/2 muscle strength grade for improved functional mobility  Baseline: see flow sheets Goal status: ONGOING  4.  Improve  5xSTS by MCID of 5" and by MCID of 87ft as indication of improved functional mobility  Baseline: 5xSTS c use of hands from bari-chair 23.9". 370" 09/14/22: 5 x STS 24.3 sec, 2 MWT 424 feet  Goal status: Partially met   5.  Pt's FOTO score will improved to the predicted value of 56% as indication of improved function  Baseline: 47% 09/14/22: 44% Goal status: ONGOING PLAN:  PT FREQUENCY: 2x/week  PT DURATION: 8 weeks  PLANNED INTERVENTIONS: Therapeutic exercises, Therapeutic activity, Neuromuscular re-education, Balance training, Gait training, Patient/Family education, Self Care, Joint mobilization, Aquatic Therapy, Dry Needling, Electrical stimulation, Cryotherapy, Moist heat, Taping, Vasopneumatic device, Ultrasound, Ionotophoresis 4mg /ml Dexamethasone, Manual therapy, and Re-evaluation  PLAN FOR NEXT SESSION: ; assess response to HEP; progress therex as indicated; use of modalities, manual therapy; and TPDN as indicated;    Fredderick Phenix PT 09/24/22 2:52 PM Phone: [phone] Fax: [phone]

## 2022-09-29 ENCOUNTER — Ambulatory Visit (INDEPENDENT_AMBULATORY_CARE_PROVIDER_SITE_OTHER): Payer: Medicare Other | Admitting: Family Medicine

## 2022-09-29 ENCOUNTER — Encounter (INDEPENDENT_AMBULATORY_CARE_PROVIDER_SITE_OTHER): Payer: Self-pay | Admitting: Family Medicine

## 2022-09-29 VITALS — BP 139/75 | HR 60 | Temp 98.2°F | Ht 64.0 in | Wt 206.0 lb

## 2022-09-29 DIAGNOSIS — Z6835 Body mass index (BMI) 35.0-35.9, adult: Secondary | ICD-10-CM | POA: Diagnosis not present

## 2022-09-29 DIAGNOSIS — E669 Obesity, unspecified: Secondary | ICD-10-CM | POA: Diagnosis not present

## 2022-09-29 DIAGNOSIS — R5383 Other fatigue: Secondary | ICD-10-CM | POA: Insufficient documentation

## 2022-09-29 DIAGNOSIS — F439 Reaction to severe stress, unspecified: Secondary | ICD-10-CM | POA: Diagnosis not present

## 2022-09-29 NOTE — Therapy (Signed)
Progress Note Reporting Period 6/12 to 7/25  See note below for Objective Data and Assessment of Progress/Goals.       Patient Name: Heather [name] MRN: [id number] DOB:02-07-1946, 77 y.o., female Today's Date: 09/29/2022  END OF SESSION:     Past Medical History:  Diagnosis Date   Abdominal hernia    Abnormal Pap smear    ALLERGIC RHINITIS 10/22/2006   Allergy    ANEMIA 12/18/2008   Arthritis    scoliosis moderate deg changes lumbar Xray 11/04/17    Asthma    ASYMPTOMATIC POSTMENOPAUSAL STATUS 11/22/2007   Back pain    CAD (coronary artery disease)    in LAD   CHF (congestive heart failure) (HCC)    Complication of anesthesia 2010   pt states she woke up during colonoscopy and was aware of "losing control of breathing" during mammoplasty   Degenerative arthritis    Diverticulosis    Dyslipidemia    Fibroid    Frequent headaches    GERD 07/20/2007   GOITER, MULTINODULAR 07/20/2007   Headache(784.0) 07/20/2007   HEARING LOSS 11/22/2007   Hiatal hernia    Hiatal hernia    large   History of chicken pox    Hx of colposcopy with cervical biopsy    HYPERCHOLESTEROLEMIA 01/13/2007   Hyperglycemia    HYPERTENSION 10/22/2006   Lung nodule    unchanged since 05/26/13    Migraines    NASH (nonalcoholic steatohepatitis)    Nocturnal hypoxemia 02/17/2013   Obesity    Obstructive sleep apnea    OSA on CPAP    per neurology   OSTEOARTHRITIS 10/22/2006   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   Pre-diabetes    Sedimentation rate elevation    Sleep apnea    on cpap   Urine incontinence    UTI (urinary tract infection)    Past Surgical History:  Procedure Laterality Date   BREAST SURGERY  1984   Breast reduction b/l    CATARACT EXTRACTION, BILATERAL     DEXA  08/2005   DILATION AND CURETTAGE OF UTERUS     ELECTROCARDIOGRAM  10/15/2006   ESOPHAGOGASTRODUODENOSCOPY  12/08/2005   FOOT SURGERY     hammertoe and bunion 05/2017    Stress Cardiolite   10/21/2005   sweat gland removal     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   Procedure: Left TOTAL KNEE ARTHROPLASTY;  Surgeon: Kathryne Hitch, MD;  Location: [facility] OR;  Service: Orthopedics;  Laterality: Left;   UMBILICAL HERNIA REPAIR N/A 04/25/2022   Procedure: OPEN INCARCERATED HERNIA REPAIR UMBILICAL WITH APPENDECTOMY;  Surgeon: Quentin Ore, MD;  Location: [facility] OR;  Service: General;  Laterality: N/A;   WISDOM TOOTH EXTRACTION     Patient Active Problem List   Diagnosis Date Noted   Other fatigue 09/29/2022   Urge incontinence 08/04/2022   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 06/16/2022   Hypotension 05/26/2022   BMI 34.0-34.9,adult 05/06/2022   Obesity, Beginning BMI 44.60 05/06/2022   SBO (small bowel obstruction) (HCC) 04/25/2022   Obstructive sleep apnea treated with BiPAP 04/25/2022   Dependence on nocturnal oxygen therapy 04/25/2022   Hyponatremia 04/25/2022   Generalized abdominal pain 04/23/2022   BMI 36.0-36.9,adult 04/08/2022   Obesity, Beginning BMI 44.60 04/08/2022   Persistent cough for 3 weeks or longer 02/13/2022   Type 2 diabetes mellitus with other specified complication (HCC) 12/10/2021   Traumatic tear of supraspinatus tendon of right shoulder 11/24/2021  Right arm pain 11/18/2021   Positive self-administered antigen test for COVID-19 11/09/2021   Stress 10/16/2021   Status post total left knee replacement 03/11/2021   Chronic pain 01/06/2021   Lumbar radiculopathy 07/04/2020   Bilateral sciatica 07/04/2020   Pain in both hands 07/04/2020   Hyperlipidemia associated with type 2 diabetes mellitus (HCC) 05/01/2020   Abnormal blood level of copper 03/14/2020   Leukopenia 03/14/2020   Obesity (BMI 30-39.9) 11/13/2019   Chronic pain of right thumb 10/11/2019   Vitamin D deficiency 07/27/2019   Memory loss 04/27/2019   Mass of left wrist 01/18/2019   Ventral hernia without obstruction or gangrene  01/18/2019   Mass of right thigh 09/13/2018   Depression 08/02/2018   Sleeps in sitting position due to orthopnea 07/13/2018   BiPAP (biphasic positive airway pressure) dependence 05/24/2018   Goiter 04/15/2017   Urinary incontinence 04/15/2017   Type 2 diabetes mellitus without complication, without long-term current use of insulin (HCC) 02/15/2017   Bilateral leg cramps 01/05/2017   Peripheral neuropathy 05/27/2016   Elevated sed rate 03/05/2016   Muscle spasm of both lower legs 02/12/2016   Leg edema 02/12/2016   Iron deficiency anemia 02/12/2016   Hypokalemia 02/12/2016   Pernicious anemia 02/12/2016   Degenerative disc disease, lumbar 04/09/2015   Degenerative arthritis of knee, bilateral 03/13/2015   Chronic low back pain 03/13/2015   CAD S/P percutaneous coronary angioplasty 10/31/2013   Incidental lung nodule, > 3mm and < 8mm, Left lower lobe 7mm - June 2014, April 2015 unchanged 06/25/2013   OSA on CPAP 06/21/2013   Myalgia and myositis 05/02/2013   Muscle ache 04/03/2013   Class 3 severe obesity with serious comorbidity and body mass index (BMI) of 40.0 to 44.9 in adult (HCC) 01/24/2013   Large hiatal hernia 08/24/2012    LLL nodule 6mm - June 2014, described in 2003 07/13/2012   Asthma, chronic 06/29/2012   Migraine without aura 03/11/2012   SOB (shortness of breath) 12/28/2011   Hx of fibroids 08/03/2011   Knee pain, left 12/25/2010   NECK PAIN 12/03/2009   TINNITUS 11/22/2007   HEARING LOSS 11/22/2007   Chronic pain of both knees 11/22/2007   Gastroesophageal reflux disease without esophagitis 07/20/2007   NASH (nonalcoholic steatohepatitis) 07/20/2007   ANXIETY 10/22/2006   Allergic rhinitis 10/22/2006   Osteoarthritis 10/22/2006    PCP: Pincus Sanes, MD  REFERRING PROVIDER: Judi Saa, DO   REFERRING DIAG: M25.561,M25.562,G89.29 (ICD-10-CM) - Chronic pain of both knees   THERAPY DIAG:  No diagnosis found.  Rationale for Evaluation and  Treatment: Rehabilitation  ONSET DATE: 5 months  SUBJECTIVE:   SUBJECTIVE STATEMENT: Pt reports some increased stiffness after being very active at her church conference and d/t the weather outside.   PERTINENT HISTORY: High BMI, CHF, CAD, L TKR, degenerative arthritis  PAIN:  Are you having pain? Yes: NPRS scale: 2/10 Pain location: Both knees Pain description: Tightness/achiness Aggravating factors: Cold, extended feet, after periods of rest Relieving factors: Occasional use of pain medication, seated elliptical Normal Range: 3-6/10  PRECAUTIONS: None  WEIGHT BEARING RESTRICTIONS: No  FALLS:  Has patient fallen in last 6 months? 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, reads, church-sings in choir  PLOF: Independent  PATIENT GOALS: Pt be Ind c better quality of movement  NEXT MD VISIT: Next week  OBJECTIVE:   DIAGNOSTIC FINDINGS:  DG L knee 07/10/22 L knee Narrative: Well-seated prosthesis without complication   PATIENT SURVEYS:  FOTO: Perceived function   47%, predicted   56%  09/14/22: 43%  COGNITION: Overall cognitive status: Within functional limits for tasks assessed     SENSATION: WFL  EDEMA:  Swelling of both legs c pitting edema of ankle at sock line  MUSCLE LENGTH: Hamstrings: Right 40 deg; Left 40 deg Thomas test: Right Tight deg; Left tight deg  POSTURE: rounded shoulders, forward head, and increased thoracic kyphosis  PALPATION: Not overly TTP of the knees  LOWER EXTREMITY ROM:  Active ROM Right eval Left eval Right 09/14/22 Left 09/14/22  Hip flexion      Hip extension      Hip abduction      Hip adduction      Hip internal rotation      Hip external rotation       Knee flexion 95 100 98 104  Knee extension 10 0    Ankle dorsiflexion      Ankle plantarflexion      Ankle inversion      Ankle eversion       (Blank rows = not tested)  LOWER EXTREMITY MMT:  MMT Right eval Left eval Right 09/14/22 Left  09/14/22  Hip flexion 4- 4- 4 4  Hip extension 3 3    Hip abduction 4- 4- 4 4-  Hip adduction      Hip internal rotation      Hip external rotation 4 4    Knee flexion 4+ 4+    Knee extension 5 5    Ankle dorsiflexion      Ankle plantarflexion      Ankle inversion      Ankle eversion       (Blank rows = not tested)  LOWER EXTREMITY SPECIAL TESTS:    For the R knee: Knee special tests: Anterior drawer test: negative, Posterior drawer test: negative, and McMurray's test: negative  FUNCTIONAL TESTS:  5 times sit to stand: 23.9 2 minute walk test: 370' 09/14/22: 5 x STS   24.3 sec with UE, bariatric chair 09/14/22: 2 MWT: 424 feet   Single leg stance: 1" 09/14/22: 9 sec SLS , each leg    GAIT: Distance walked: 370' Assistive device utilized: None Level of assistance: Complete Independence Comments: Slower pace    TODAY'S TREATMENT:  [facility] Adult PT Treatment:                                                DATE: 09-30-22 Therapeutic Exercise: *** Manual Therapy: *** Neuromuscular re-ed: *** Therapeutic Activity: *** Modalities: *** Self Care: ***   [facility] Adult PT Treatment:                                                DATE: 09/24/22 Aquatic therapy at [facility]- [address] - therapeutic pool temp approximately 92 degrees. Pt enters building ambulating without AD and aide of another person. Treatment took place in water 3.8 to  4 ft 8 in.feet deep depending upon activity.  Pt entered and exited the pool via stair and handrails independently. Pain at initiation of aquatic treatment 2/10  At end 0/10    Aquatic Exercise: Fwd/lateral/bkwds - 2 laps ea Fwd crossover walking Alternating crossover walking Tandem  walking with rainbow dumbells Reverse lunge with dumbells - x10 ea Gentle rotation with yellow dumbells - x1' Step ups on bottom step x10 each fwd/lat BIL SLS with rainbow DB Pool edge with UE support as needed:  Ham curls x 20 Squats 2x20 Hip abd/add x20 BIL     Pt requires the buoyancy of water for active assisted exercises with buoyancy supported for strengthening and AROM exercises. Hydrostatic pressure also supports joints by unweighting joint load by at least 50 % in 3-4 feet depth water. 80% in chest to neck deep water. Water will provide assistance with movement using the current and laminar flow while the buoyancy reduces weight bearing. Pt requires the viscosity of the water for resistance with strengthening exercises.  [facility] Adult PT Treatment:                                                DATE: 09/21/22 Therapeutic Exercise: SLR x 10 each  Hip abduction x 10 each  Gluteal squeeze with legs on exercise ball  Hamstring curls with feet on ball  Hip flexor stretch bilat  Standing hip abduction x 10 each  Standing heel toe raises Gastroc stretch Forearms on counter hip extension x 10 each     [facility] Adult PT Treatment:                                                DATE: 09/17/22 Aquatic therapy at [facility]- [address] - therapeutic pool temp approximately 92 degrees. Pt enters building ambulating without AD and aide of another person. Treatment took place in water 3.8 to  4 ft 8 in.feet deep depending upon activity.  Pt entered and exited the pool via stair and handrails independently. Pain at initiation of aquatic treatment 2/10  At end 0/10   Aquatic Exercise: Fwd/lateral/bkwds - 2 laps ea Walking lunge - 2 laps with yellow DB Gentle rotation with yellow dumbells - x1' Step ups on bottom step x10 each fwd/lat BIL Pool edge with UE support as needed:  Marching hip flexion to knee extension x20 BIL Heel/toe raises x 20 Ham curls x 20 Squats 2x20 Hip abd/add x20  BIL Hip ext/flex with knee straight x 20 BIL Hip Circles CC/CCW x10 each BIL   Pt requires the buoyancy of water for active assisted exercises with buoyancy supported for strengthening and AROM exercises. Hydrostatic pressure also supports joints by unweighting joint load by at least 50 % in 3-4 feet depth water. 80% in chest to neck deep water. Water will provide assistance with movement using the current and laminar flow while the buoyancy reduces weight bearing. Pt requires the viscosity of the water for resistance with strengthening exercises.  [facility] Adult PT Treatment:  DATE: 09/14/22 Therapeutic Exercise: SLR x 10 each  Hooklying vs supine gluteal sets  Standing hip abduction  x 10 each  Heel raises and toe raises x10 each SLS 9 sec each Tandem stance- increased back pain Heel slides with strap x 3 each  Supine hamstring /gastroc stretch with strap Right hip flexor hang- added strap for education Seated pelvic rocking STS from mat with cues for weight shift. X 4   Therapeutic Activity: 2 MWT 5 x STS     PATIENT EDUCATION:  Education details: Eval findings, POC, HEP, self care  Person educated: Patient Education method: Explanation, Demonstration, Tactile cues, Verbal cues, and Handouts Education comprehension: verbalized understanding, returned demonstration, verbal cues required, and tactile cues required  HOME EXERCISE PROGRAM: Access Code: [email or web address] URL: [email or web address] Date: 08/12/2022 Prepared by: Joellyn Rued  Exercises - Active Straight Leg Raise with Quad Set  - 1 x daily - 7 x weekly - 2 sets - 10 reps - 3 hold - Hooklying Clamshell with Resistance  - 1 x daily - 7 x weekly - 2 sets - 10 reps - 3 hold - Sidelying Hip Abduction  - 1 x daily - 7 x weekly - 2 sets - 10 reps - 3 hold - Sit to Stand with Counter Support  - 1 x daily - 7 x weekly - 2 sets - 10 reps - 3 hold Added 09/21/22 - Supine  Gluteus and Hamstring Sets on Swiss Ball  - 1 x daily - 7 x weekly - 2 sets - 10 reps - 5 hold - Prone Hip Extension on Table  - 1 x daily - 7 x weekly - 1-2 sets - 10 reps - Standing Gastroc Stretch at Counter  - 1 x daily - 7 x weekly - 1 sets - 2-3 reps - 20 hold  ASSESSMENT:  CLINICAL IMPRESSION: Margrette has progressed well with therapy.  Improved impairments include: LE strength, pain.  Functional improvements include: transfers, ambulation distance, ability to participate in community activities at church.  Progressions needed include: continued work on functional strength and endurance.  Barriers to progress include: NA.  Please see GOALS section for progress on short term and long term goals established at evaluation.  I recommend continuation of PT to allow completion of remaining goals and continued functional progression.    OBJECTIVE IMPAIRMENTS: decreased activity tolerance, decreased balance, difficulty walking, decreased ROM, decreased strength, obesity, and pain.   ACTIVITY LIMITATIONS: carrying, lifting, bending, sitting, standing, squatting, stairs, and locomotion level  PARTICIPATION LIMITATIONS: meal prep, cleaning, laundry, community activity, and church  PERSONAL FACTORS: High BMI, CHF, CAD, L TKR, degenerative arthritis are also affecting patient's functional outcome.   REHAB POTENTIAL: Good  CLINICAL DECISION MAKING: Evolving/moderate complexity  EVALUATION COMPLEXITY: Moderate   GOALS:  SHORT TERM GOALS: Target date: 09/04/22 Pt will be Ind in an initial HEP Baseline: started 09/14/22: can complete seated elliptical for 1 hour, 3-4 x per week, HEP 1 x per week.  Goal status: ONGOING  LONG TERM GOALS: Target date: 10/16/22  Pt will be Ind in a final HEP to maintain achieved LOF  Baseline: started Goal status: ONGOING  2.  Increase single standing for each LE to greater than 5 sec for improved functional mobility Baseline: 1" each LE 09/14/22: 9 sec Goal  status: MET  3.  Increase bilat knee and hip strength by at least 1/2 muscle strength grade for improved functional mobility  Baseline: see flow sheets Goal status: ONGOING  4.  Improve  5xSTS by MCID of 5" and by MCID of 45ft as indication of improved functional mobility  Baseline: 5xSTS c use of hands from bari-chair 23.9". 370" 09/14/22: 5 x STS 24.3 sec, 2 MWT 424 feet  Goal status: Partially met   5.  Pt's FOTO score will improved to the predicted value of 56% as indication of improved function  Baseline: 47% 09/14/22: 44% Goal status: ONGOING PLAN:  PT FREQUENCY: 2x/week  PT DURATION: 8 weeks  PLANNED INTERVENTIONS: Therapeutic exercises, Therapeutic activity, Neuromuscular re-education, Balance training, Gait training, Patient/Family education, Self Care, Joint mobilization, Aquatic Therapy, Dry Needling, Electrical stimulation, Cryotherapy, Moist heat, Taping, Vasopneumatic device, Ultrasound, Ionotophoresis 4mg /ml Dexamethasone, Manual therapy, and Re-evaluation  PLAN FOR NEXT SESSION: ; assess response to HEP; progress therex as indicated; use of modalities, manual therapy; and TPDN as indicated;    ***

## 2022-09-29 NOTE — Progress Notes (Signed)
Chief Complaint:   OBESITY Heather [name] is here to discuss her progress with her obesity treatment plan along with follow-up of her obesity related diagnoses. Heather [name] is on the Category 2 Plan and states she is following her eating plan approximately 80% of the time. Heather [name] states she is doing physical therapy for 30 minutes 3-4 times per week.  Today's visit was #: 86 Starting weight: 268 lbs Starting date: 12/23/2016 Today's weight: 206 lbs Today's date: 09/29/2022 Total lbs lost to date: 62 Total lbs lost since last in-office visit: 2  Interim History: Patient has done well with her weight loss.  She is doing physical therapy in the pool and she is feeling better.  She has remained mindful of her eating despite extra stress.  She is working on [employer] out.  Subjective:   1. Stress Patient's stress has increased.  She has volunteered with her church, but she is being volunteered to do more.  She is feeling overwhelmed and overextended.  2. Other fatigue Patient has overextended herself and she is feeling more fatigued.  She notes feeling better when she takes time to slow down and relax.  Assessment/Plan:   1. Stress Stress reduction techniques were discussed.  Patient was encouraged to set boundaries with other people.  We will follow-up at her next visit in 1 month.  2. Other fatigue Patient was encouraged to set aside time for herself and she was encouraged to continue with self-care.  We will follow-up at her next visit in 1 month.  3. BMI 35.0-35.9,adult  4. Obesity, Beginning BMI 44.60 Heather [name] is currently in the action stage of change. As such, her goal is to continue with weight loss efforts. She has agreed to the Category 2 Plan.   Exercise goals: As is.   Behavioral modification strategies: increasing lean protein intake.  Heather [name] has agreed to follow-up with our clinic in 4 weeks. She was informed of the importance of frequent follow-up visits to maximize her  success with intensive lifestyle modifications for her multiple health conditions.   Objective:   Blood pressure 139/75, pulse 60, temperature 98.2 F (36.8 C), height 5\' 4"  (1.626 m), weight 206 lb (93.4 kg), SpO2 95%. Body mass index is 35.36 kg/m.  Lab Results  Component Value Date   CREATININE 0.72 05/26/2022   BUN 10 05/26/2022   NA 140 05/26/2022   K 3.6 05/26/2022   CL 104 05/26/2022   CO2 30 05/26/2022   Lab Results  Component Value Date   ALT 16 05/26/2022   AST 21 05/26/2022   ALKPHOS 64 05/26/2022   BILITOT 0.3 05/26/2022   Lab Results  Component Value Date   HGBA1C 6.2 05/26/2022   HGBA1C 6.1 11/21/2021   HGBA1C 5.3 04/14/2021   HGBA1C 5.8 (H) 03/07/2021   HGBA1C 6.1 (H) 11/25/2020   Lab Results  Component Value Date   INSULIN 6.0 11/25/2020   INSULIN 4.3 10/09/2019   INSULIN 8.6 07/05/2019   INSULIN 6.4 12/05/2018   INSULIN 3.6 04/27/2018   Lab Results  Component Value Date   TSH 0.62 05/26/2022   Lab Results  Component Value Date   CHOL 145 05/26/2022   HDL 62.50 05/26/2022   LDLCALC 60 05/26/2022   TRIG 111.0 05/26/2022   CHOLHDL 2 05/26/2022   Lab Results  Component Value Date   VD25OH 48.25 11/21/2021   VD25OH 55.3 04/14/2021   VD25OH 66.2 11/25/2020   Lab Results  Component Value Date   WBC 4.8  05/26/2022   HGB 12.0 05/26/2022   HCT 36.5 05/26/2022   MCV 95.2 05/26/2022   PLT 246.0 05/26/2022   Lab Results  Component Value Date   IRON 49 05/26/2022   TIBC 211.4 (L) 05/26/2022   FERRITIN 288.0 05/26/2022   Attestation Statements:   Reviewed by clinician on day of visit: allergies, medications, problem list, medical history, surgical history, family history, social history, and previous encounter notes.  I have personally spent 40 minutes total time today in preparation, patient care, and documentation for this visit, including the following: review of clinical lab tests; review of medical tests/procedures/services.   I,  Burt Knack, am acting as transcriptionist for Quillian Quince, MD.  I have reviewed the above documentation for accuracy and completeness, and I agree with the above. -  Quillian Quince, MD

## 2022-09-30 ENCOUNTER — Encounter: Payer: Self-pay | Admitting: Physical Therapy

## 2022-09-30 ENCOUNTER — Ambulatory Visit: Payer: Medicare Other | Admitting: Physical Therapy

## 2022-09-30 DIAGNOSIS — R262 Difficulty in walking, not elsewhere classified: Secondary | ICD-10-CM

## 2022-09-30 DIAGNOSIS — M6281 Muscle weakness (generalized): Secondary | ICD-10-CM

## 2022-09-30 DIAGNOSIS — M25562 Pain in left knee: Secondary | ICD-10-CM | POA: Diagnosis not present

## 2022-09-30 DIAGNOSIS — M25561 Pain in right knee: Secondary | ICD-10-CM | POA: Diagnosis not present

## 2022-09-30 DIAGNOSIS — G8929 Other chronic pain: Secondary | ICD-10-CM | POA: Diagnosis not present

## 2022-10-01 NOTE — Therapy (Signed)
PHYSICAL THERAPY TREATMENT NOTE   Patient Name: Heather [name] MRN: [id number] DOB:Jul 27, 1945, 77 y.o., female Today's Date: 10/02/2022  END OF SESSION:  PT End of Session - 10/02/22 1128     Visit Number 12    Number of Visits 17    Date for PT Re-Evaluation 10/16/22    Authorization Type MEDICARE PART A AND B    Progress Note Due on Visit 10    PT Start Time 1130    PT Stop Time 1215    PT Time Calculation (min) 45 min    Activity Tolerance Patient tolerated treatment well    Behavior During Therapy [facility] for tasks assessed/performed                Past Medical History:  Diagnosis Date   Abdominal hernia    Abnormal Pap smear    ALLERGIC RHINITIS 10/22/2006   Allergy    ANEMIA 12/18/2008   Arthritis    scoliosis moderate deg changes lumbar Xray 11/04/17    Asthma    ASYMPTOMATIC POSTMENOPAUSAL STATUS 11/22/2007   Back pain    CAD (coronary artery disease)    in LAD   CHF (congestive heart failure) (HCC)    Complication of anesthesia 2010   pt states she woke up during colonoscopy and was aware of "losing control of breathing" during mammoplasty   Degenerative arthritis    Diverticulosis    Dyslipidemia    Fibroid    Frequent headaches    GERD 07/20/2007   GOITER, MULTINODULAR 07/20/2007   Headache(784.0) 07/20/2007   HEARING LOSS 11/22/2007   Hiatal hernia    Hiatal hernia    large   History of chicken pox    Hx of colposcopy with cervical biopsy    HYPERCHOLESTEROLEMIA 01/13/2007   Hyperglycemia    HYPERTENSION 10/22/2006   Lung nodule    unchanged since 05/26/13    Migraines    NASH (nonalcoholic steatohepatitis)    Nocturnal hypoxemia 02/17/2013   Obesity    Obstructive sleep apnea    OSA on CPAP    per neurology   OSTEOARTHRITIS 10/22/2006   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   Pre-diabetes    Sedimentation rate elevation    Sleep apnea    on cpap   Urine incontinence    UTI (urinary tract infection)    Past Surgical  History:  Procedure Laterality Date   BREAST SURGERY  1984   Breast reduction b/l    CATARACT EXTRACTION, BILATERAL     DEXA  08/2005   DILATION AND CURETTAGE OF UTERUS     ELECTROCARDIOGRAM  10/15/2006   ESOPHAGOGASTRODUODENOSCOPY  12/08/2005   FOOT SURGERY     hammertoe and bunion 05/2017    Stress Cardiolite  10/21/2005   sweat gland removal     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   Procedure: Left TOTAL KNEE ARTHROPLASTY;  Surgeon: Kathryne Hitch, MD;  Location: [facility] OR;  Service: Orthopedics;  Laterality: Left;   UMBILICAL HERNIA REPAIR N/A 04/25/2022   Procedure: OPEN INCARCERATED HERNIA REPAIR UMBILICAL WITH APPENDECTOMY;  Surgeon: Quentin Ore, MD;  Location: [facility] OR;  Service: General;  Laterality: N/A;   WISDOM TOOTH EXTRACTION     Patient Active Problem List   Diagnosis Date Noted   Other fatigue 09/29/2022   Urge incontinence 08/04/2022   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 06/16/2022   Hypotension 05/26/2022   BMI 34.0-34.9,adult 05/06/2022   Obesity, Beginning BMI  44.60 05/06/2022   SBO (small bowel obstruction) (HCC) 04/25/2022   Obstructive sleep apnea treated with BiPAP 04/25/2022   Dependence on nocturnal oxygen therapy 04/25/2022   Hyponatremia 04/25/2022   Generalized abdominal pain 04/23/2022   BMI 36.0-36.9,adult 04/08/2022   Obesity, Beginning BMI 44.60 04/08/2022   Persistent cough for 3 weeks or longer 02/13/2022   Type 2 diabetes mellitus with other specified complication (HCC) 12/10/2021   Traumatic tear of supraspinatus tendon of right shoulder 11/24/2021   Right arm pain 11/18/2021   Positive self-administered antigen test for COVID-19 11/09/2021   Stress 10/16/2021   Status post total left knee replacement 03/11/2021   Chronic pain 01/06/2021   Lumbar radiculopathy 07/04/2020   Bilateral sciatica 07/04/2020   Pain in both hands 07/04/2020   Hyperlipidemia associated with type 2  diabetes mellitus (HCC) 05/01/2020   Abnormal blood level of copper 03/14/2020   Leukopenia 03/14/2020   Obesity (BMI 30-39.9) 11/13/2019   Chronic pain of right thumb 10/11/2019   Vitamin D deficiency 07/27/2019   Memory loss 04/27/2019   Mass of left wrist 01/18/2019   Ventral hernia without obstruction or gangrene 01/18/2019   Mass of right thigh 09/13/2018   Depression 08/02/2018   Sleeps in sitting position due to orthopnea 07/13/2018   BiPAP (biphasic positive airway pressure) dependence 05/24/2018   Goiter 04/15/2017   Urinary incontinence 04/15/2017   Type 2 diabetes mellitus without complication, without long-term current use of insulin (HCC) 02/15/2017   Bilateral leg cramps 01/05/2017   Peripheral neuropathy 05/27/2016   Elevated sed rate 03/05/2016   Muscle spasm of both lower legs 02/12/2016   Leg edema 02/12/2016   Iron deficiency anemia 02/12/2016   Hypokalemia 02/12/2016   Pernicious anemia 02/12/2016   Degenerative disc disease, lumbar 04/09/2015   Degenerative arthritis of knee, bilateral 03/13/2015   Chronic low back pain 03/13/2015   CAD S/P percutaneous coronary angioplasty 10/31/2013   Incidental lung nodule, > 3mm and < 8mm, Left lower lobe 7mm - June 2014, April 2015 unchanged 06/25/2013   OSA on CPAP 06/21/2013   Myalgia and myositis 05/02/2013   Muscle ache 04/03/2013   Class 3 severe obesity with serious comorbidity and body mass index (BMI) of 40.0 to 44.9 in adult (HCC) 01/24/2013   Large hiatal hernia 08/24/2012    LLL nodule 6mm - June 2014, described in 2003 07/13/2012   Asthma, chronic 06/29/2012   Migraine without aura 03/11/2012   SOB (shortness of breath) 12/28/2011   Hx of fibroids 08/03/2011   Knee pain, left 12/25/2010   NECK PAIN 12/03/2009   TINNITUS 11/22/2007   HEARING LOSS 11/22/2007   Chronic pain of both knees 11/22/2007   Gastroesophageal reflux disease without esophagitis 07/20/2007   NASH (nonalcoholic steatohepatitis)  07/20/2007   ANXIETY 10/22/2006   Allergic rhinitis 10/22/2006   Osteoarthritis 10/22/2006    PCP: Pincus Sanes, MD  REFERRING PROVIDER: Judi Saa, DO   REFERRING DIAG: M25.561,M25.562,G89.29 (ICD-10-CM) - Chronic pain of both knees   THERAPY DIAG:  Chronic pain of left knee  Chronic pain of right knee  Muscle weakness (generalized)  Difficulty in walking, not elsewhere classified  Rationale for Evaluation and Treatment: Rehabilitation  ONSET DATE: 5 months  SUBJECTIVE:   SUBJECTIVE STATEMENT: Patient reports no current knee pain, states the L has been hurting more than the R over the past week. She states she has been busy and stressed and that the recent weather has been increasing her pain as well.  PERTINENT HISTORY: High BMI, CHF, CAD, L TKR, degenerative arthritis  PAIN:  Are you having pain? Yes: NPRS scale: 2/10 Pain location: Both knees Pain description: Tightness/achiness Aggravating factors: Cold, extended feet, after periods of rest Relieving factors: Occasional use of pain medication, seated elliptical Normal Range: 3-6/10  PRECAUTIONS: None  WEIGHT BEARING RESTRICTIONS: No  FALLS:  Has patient fallen in last 6 months? 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, reads, church-sings in choir  PLOF: Independent  PATIENT GOALS: Pt be Ind c better quality of movement  NEXT MD VISIT: Next week  OBJECTIVE:   DIAGNOSTIC FINDINGS:  DG L knee 07/10/22 L knee Narrative: Well-seated prosthesis without complication   PATIENT SURVEYS:  FOTO: Perceived function   47%, predicted   56%  09/14/22: 43%  COGNITION: Overall cognitive status: Within functional limits for tasks  assessed     SENSATION: WFL  EDEMA:  Swelling of both legs c pitting edema of ankle at sock line  MUSCLE LENGTH: Hamstrings: Right 40 deg; Left 40 deg Thomas test: Right Tight deg; Left tight deg  POSTURE: rounded shoulders, forward head, and increased thoracic kyphosis  PALPATION: Not overly TTP of the knees  LOWER EXTREMITY ROM:  Active ROM Right eval Left eval Right 09/14/22 Left 09/14/22  Hip flexion      Hip extension      Hip abduction      Hip adduction      Hip internal rotation      Hip external rotation      Knee flexion 95 100 98 104  Knee extension 10 0    Ankle dorsiflexion      Ankle plantarflexion      Ankle inversion      Ankle eversion       (Blank rows = not tested)  LOWER EXTREMITY MMT:  MMT Right eval Left eval Right 09/14/22 Left  09/14/22  Hip flexion 4- 4- 4 4  Hip extension 3 3    Hip abduction 4- 4- 4 4-  Hip adduction      Hip internal rotation      Hip external rotation 4 4    Knee flexion 4+ 4+    Knee extension 5 5    Ankle dorsiflexion      Ankle plantarflexion      Ankle inversion      Ankle eversion       (Blank rows = not tested)  LOWER EXTREMITY SPECIAL TESTS:    For the R knee: Knee special tests: Anterior drawer test: negative, Posterior drawer test: negative, and McMurray's test: negative  FUNCTIONAL TESTS:  5 times sit to stand: 23.9 2 minute walk test: 370' 09/14/22: 5 x STS   24.3 sec with UE, bariatric chair 09/14/22: 2 MWT: 424 feet   Single leg stance: 1" 09/14/22: 9 sec SLS , each leg    GAIT: Distance walked: 370' Assistive device utilized: None Level of assistance: Complete Independence Comments: Slower pace    TODAY'S TREATMENT:  [facility] Adult PT Treatment:  DATE: 10/02/22 Aquatic therapy at [facility]- [address] - therapeutic pool temp approximately 92 degrees. Pt enters building ambulating without AD and aide of another person. Treatment took  place in water 3.8 to  4 ft 8 in.feet deep depending upon activity.  Pt entered and exited the pool via stair and handrails independently.   Aquatic Exercise: Walking forwards/backwards/sidestepping with yellow DB Runners stretch 30" BIL Runners hamstring stretch 30" BIL Step ups on bottom step x10 each fwd/lat BIL SLS with yellow DB Ham curls x 20 Squats 2x20 Marching hip flexion to knee extension x20 BIL Hip flexion/ext knee straight x20 BIL Hip abd/add x20 BIL Gentle rotation with yellow dumbells - x1'   Pt requires the buoyancy of water for active assisted exercises with buoyancy supported for strengthening and AROM exercises. Hydrostatic pressure also supports joints by unweighting joint load by at least 50 % in 3-4 feet depth water. 80% in chest to neck deep water. Water will provide assistance with movement using the current and laminar flow while the buoyancy reduces weight bearing. Pt requires the viscosity of the water for resistance with strengthening exercises.   [facility] Adult PT Treatment:                                                DATE: 09-30-22 Aquatic therapy at [facility]- [address] - therapeutic pool temp approximately 92 degrees. Pt enters building ambulating without AD and aide of another person. Treatment took place in water 3.8 to  4 ft 8 in.feet deep depending upon activity.  Pt entered and exited the pool via stair and handrails independently. Pain at initiation of aquatic treatment 5/10  At end 0/10    Sitting on lift chair    Bicycle kicks x1' Reverse bicycle kicks x1' Flutter kicks x1' Scissor kicks x 1' Aquatic Exercise: Runners stretch 30 " x 2 on BIL Runners hamstring stretch 30" x 2 BIL Fwd/side stepping/bkwds all with UE and Blue DB Fwd crossover walking Step ups on bottom step x10 each fwd/lat BIL Side step crossover walking Karaoke stepping with PT CGA x 1 as needed  SLS with yellow DB Ham curls x 20 Squats 2x20 Hip abd/add x20  BIL With blue noodle under arches of feet.  Toe touch and heel touch for increasing AROM of bil feet.  Tandem walking on blue noodle with UE support as needed Gentle rotation with yellow dumbells - x1' Aqua stretch of bil quads and PT assisted standing quad stretch with VC for erect posture and decrease trunk flexion    Pt requires the buoyancy of water for active assisted exercises with buoyancy supported for strengthening and AROM exercises. Hydrostatic pressure also supports joints by unweighting joint load by at least 50 % in 3-4 feet depth water. 80% in chest to neck deep water. Water will provide assistance with movement using the current and laminar flow while the buoyancy reduces weight bearing. Pt requires the viscosity of the water for resistance with strengthening exercises.   [facility] Adult PT Treatment:                                                DATE: 09/24/22 Aquatic therapy at [facility]- [address] -  therapeutic pool temp approximately 92 degrees. Pt enters building ambulating without AD and aide of another person. Treatment took place in water 3.8 to  4 ft 8 in.feet deep depending upon activity.  Pt entered and exited the pool via stair and handrails independently. Pain at initiation of aquatic treatment 2/10  At end 0/10    Aquatic Exercise: Fwd/lateral/bkwds - 2 laps ea Fwd crossover walking Alternating crossover walking Tandem walking with rainbow dumbells Reverse lunge with dumbells - x10 ea Gentle rotation with yellow dumbells - x1' Step ups on bottom step x10 each fwd/lat BIL SLS with rainbow DB Pool edge with UE support as needed:  Ham curls x 20 Squats 2x20 Hip abd/add x20 BIL     Pt requires the buoyancy of water for active assisted exercises with buoyancy supported for strengthening and AROM exercises. Hydrostatic pressure also supports joints by unweighting joint load by at least 50 % in 3-4 feet depth water. 80% in chest to neck deep water. Water will  provide assistance with movement using the current and laminar flow while the buoyancy reduces weight bearing. Pt requires the viscosity of the water for resistance with strengthening exercises.     PATIENT EDUCATION:  Education details: Eval findings, POC, HEP, self care  Person educated: Patient Education method: Explanation, Demonstration, Tactile cues, Verbal cues, and Handouts Education comprehension: verbalized understanding, returned demonstration, verbal cues required, and tactile cues required  HOME EXERCISE PROGRAM: Access Code: [email or web address] URL: [email or web address] Date: 08/12/2022 Prepared by: Joellyn Rued  Exercises - Active Straight Leg Raise with Quad Set  - 1 x daily - 7 x weekly - 2 sets - 10 reps - 3 hold - Hooklying Clamshell with Resistance  - 1 x daily - 7 x weekly - 2 sets - 10 reps - 3 hold - Sidelying Hip Abduction  - 1 x daily - 7 x weekly - 2 sets - 10 reps - 3 hold - Sit to Stand with Counter Support  - 1 x daily - 7 x weekly - 2 sets - 10 reps - 3 hold Added 09/21/22 - Supine Gluteus and Hamstring Sets on Swiss Ball  - 1 x daily - 7 x weekly - 2 sets - 10 reps - 5 hold - Prone Hip Extension on Table  - 1 x daily - 7 x weekly - 1-2 sets - 10 reps - Standing Gastroc Stretch at Counter  - 1 x daily - 7 x weekly - 1 sets - 2-3 reps - 20 hold  ASSESSMENT:  CLINICAL IMPRESSION: Patient presents to aquatic PT session reporting no current pain in the knees, but increased pain over the past week due to being very busy. Session today focused on LE strengthening and balance tasks in the aquatic environment for use of buoyancy to offload joints and the viscosity of water as resistance during therapeutic exercise. Patient was able to tolerate all prescribed exercises in the aquatic environment with no adverse effects. Patient continues to benefit from skilled PT services on land and aquatic based and should be progressed as able to improve functional  independence.     OBJECTIVE IMPAIRMENTS: decreased activity tolerance, decreased balance, difficulty walking, decreased ROM, decreased strength, obesity, and pain.   ACTIVITY LIMITATIONS: carrying, lifting, bending, sitting, standing, squatting, stairs, and locomotion level  PARTICIPATION LIMITATIONS: meal prep, cleaning, laundry, community activity, and church  PERSONAL FACTORS: High BMI, CHF, CAD, L TKR, degenerative arthritis are also affecting patient's functional outcome.   REHAB POTENTIAL: Good  CLINICAL DECISION  MAKING: Evolving/moderate complexity  EVALUATION COMPLEXITY: Moderate   GOALS:  SHORT TERM GOALS: Target date: 09/04/22 Pt will be Ind in an initial HEP Baseline: started 09/14/22: can complete seated elliptical for 1 hour, 3-4 x per week, HEP 1 x per week.  Goal status: ONGOING  LONG TERM GOALS: Target date: 10/16/22  Pt will be Ind in a final HEP to maintain achieved LOF  Baseline: started Goal status: ONGOING  2.  Increase single standing for each LE to greater than 5 sec for improved functional mobility Baseline: 1" each LE 09/14/22: 9 sec Goal status: MET  3.  Increase bilat knee and hip strength by at least 1/2 muscle strength grade for improved functional mobility  Baseline: see flow sheets Goal status: ONGOING  4.  Improve 5xSTS by MCID of 5" and by MCID of 77ft as indication of improved functional mobility  Baseline: 5xSTS c use of hands from bari-chair 23.9". 370" 09/14/22: 5 x STS 24.3 sec, 2 MWT 424 feet  Goal status: Partially met   5.  Pt's FOTO score will improved to the predicted value of 56% as indication of improved function  Baseline: 47% 09/14/22: 44% Goal status: ONGOING PLAN:  PT FREQUENCY: 2x/week  PT DURATION: 8 weeks  PLANNED INTERVENTIONS: Therapeutic exercises, Therapeutic activity, Neuromuscular re-education, Balance training, Gait training, Patient/Family education, Self Care, Joint mobilization, Aquatic Therapy,  Dry Needling, Electrical stimulation, Cryotherapy, Moist heat, Taping, Vasopneumatic device, Ultrasound, Ionotophoresis 4mg /ml Dexamethasone, Manual therapy, and Re-evaluation  PLAN FOR NEXT SESSION: ; assess response to HEP; progress therex as indicated; use of modalities, manual therapy; and TPDN as indicated;    Berta Minor PTA 10/02/22 12:20 PM Phone: [phone] Fax: [phone]

## 2022-10-02 ENCOUNTER — Ambulatory Visit: Payer: Medicare Other | Attending: Family Medicine

## 2022-10-02 DIAGNOSIS — G8929 Other chronic pain: Secondary | ICD-10-CM | POA: Insufficient documentation

## 2022-10-02 DIAGNOSIS — M25561 Pain in right knee: Secondary | ICD-10-CM | POA: Insufficient documentation

## 2022-10-02 DIAGNOSIS — M6281 Muscle weakness (generalized): Secondary | ICD-10-CM | POA: Diagnosis not present

## 2022-10-02 DIAGNOSIS — R262 Difficulty in walking, not elsewhere classified: Secondary | ICD-10-CM | POA: Diagnosis not present

## 2022-10-02 DIAGNOSIS — M25562 Pain in left knee: Secondary | ICD-10-CM | POA: Diagnosis not present

## 2022-10-06 NOTE — Therapy (Signed)
PHYSICAL THERAPY TREATMENT NOTE   Patient Name: Heather [name] MRN: [id number] DOB:[birth year], 77 y.o., female Today's Date: 10/07/2022  END OF SESSION:  PT End of Session - 10/07/22 1143     Visit Number 13    Number of Visits 17    Date for PT Re-Evaluation 10/16/22    Authorization Type MEDICARE PART A AND B    Progress Note Due on Visit 10    PT Start Time 1145    PT Stop Time 1227    PT Time Calculation (min) 42 min    Activity Tolerance Patient tolerated treatment well    Behavior During Therapy [facility] for tasks assessed/performed                 Past Medical History:  Diagnosis Date   Abdominal hernia    Abnormal Pap smear    ALLERGIC RHINITIS 10/22/2006   Allergy    ANEMIA 12/18/2008   Arthritis    scoliosis moderate deg changes lumbar Xray 11/04/17    Asthma    ASYMPTOMATIC POSTMENOPAUSAL STATUS 11/22/2007   Back pain    CAD (coronary artery disease)    in LAD   CHF (congestive heart failure) (HCC)    Complication of anesthesia 2010   pt states she woke up during colonoscopy and was aware of "losing control of breathing" during mammoplasty   Degenerative arthritis    Diverticulosis    Dyslipidemia    Fibroid    Frequent headaches    GERD 07/20/2007   GOITER, MULTINODULAR 07/20/2007   Headache(784.0) 07/20/2007   HEARING LOSS 11/22/2007   Hiatal hernia    Hiatal hernia    large   History of chicken pox    Hx of colposcopy with cervical biopsy    HYPERCHOLESTEROLEMIA 01/13/2007   Hyperglycemia    HYPERTENSION 10/22/2006   Lung nodule    unchanged since 05/26/13    Migraines    NASH (nonalcoholic steatohepatitis)    Nocturnal hypoxemia 02/17/2013   Obesity    Obstructive sleep apnea    OSA on CPAP    per neurology   OSTEOARTHRITIS 10/22/2006   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   Pre-diabetes    Sedimentation rate elevation    Sleep apnea    on cpap   Urine incontinence    UTI (urinary tract infection)    Past  Surgical History:  Procedure Laterality Date   BREAST SURGERY  1984   Breast reduction b/l    CATARACT EXTRACTION, BILATERAL     DEXA  08/2005   DILATION AND CURETTAGE OF UTERUS     ELECTROCARDIOGRAM  10/15/2006   ESOPHAGOGASTRODUODENOSCOPY  12/08/2005   FOOT SURGERY     hammertoe and bunion 05/2017    Stress Cardiolite  10/21/2005   sweat gland removal     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   Procedure: Left TOTAL KNEE ARTHROPLASTY;  Surgeon: Kathryne Hitch, MD;  Location: [facility] OR;  Service: Orthopedics;  Laterality: Left;   UMBILICAL HERNIA REPAIR N/A 04/25/2022   Procedure: OPEN INCARCERATED HERNIA REPAIR UMBILICAL WITH APPENDECTOMY;  Surgeon: Quentin Ore, MD;  Location: [facility] OR;  Service: General;  Laterality: N/A;   WISDOM TOOTH EXTRACTION     Patient Active Problem List   Diagnosis Date Noted   Other fatigue 09/29/2022   Urge incontinence 08/04/2022   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 06/16/2022   Hypotension 05/26/2022   BMI 34.0-34.9,adult 05/06/2022   Obesity, Beginning  BMI 44.60 05/06/2022   SBO (small bowel obstruction) (HCC) 04/25/2022   Obstructive sleep apnea treated with BiPAP 04/25/2022   Dependence on nocturnal oxygen therapy 04/25/2022   Hyponatremia 04/25/2022   Generalized abdominal pain 04/23/2022   BMI 36.0-36.9,adult 04/08/2022   Obesity, Beginning BMI 44.60 04/08/2022   Persistent cough for 3 weeks or longer 02/13/2022   Type 2 diabetes mellitus with other specified complication (HCC) 12/10/2021   Traumatic tear of supraspinatus tendon of right shoulder 11/24/2021   Right arm pain 11/18/2021   Positive self-administered antigen test for COVID-19 11/09/2021   Stress 10/16/2021   Status post total left knee replacement 03/11/2021   Chronic pain 01/06/2021   Lumbar radiculopathy 07/04/2020   Bilateral sciatica 07/04/2020   Pain in both hands 07/04/2020   Hyperlipidemia associated with  type 2 diabetes mellitus (HCC) 05/01/2020   Abnormal blood level of copper 03/14/2020   Leukopenia 03/14/2020   Obesity (BMI 30-39.9) 11/13/2019   Chronic pain of right thumb 10/11/2019   Vitamin D deficiency 07/27/2019   Memory loss 04/27/2019   Mass of left wrist 01/18/2019   Ventral hernia without obstruction or gangrene 01/18/2019   Mass of right thigh 09/13/2018   Depression 08/02/2018   Sleeps in sitting position due to orthopnea 07/13/2018   BiPAP (biphasic positive airway pressure) dependence 05/24/2018   Goiter 04/15/2017   Urinary incontinence 04/15/2017   Type 2 diabetes mellitus without complication, without long-term current use of insulin (HCC) 02/15/2017   Bilateral leg cramps 01/05/2017   Peripheral neuropathy 05/27/2016   Elevated sed rate 03/05/2016   Muscle spasm of both lower legs 02/12/2016   Leg edema 02/12/2016   Iron deficiency anemia 02/12/2016   Hypokalemia 02/12/2016   Pernicious anemia 02/12/2016   Degenerative disc disease, lumbar 04/09/2015   Degenerative arthritis of knee, bilateral 03/13/2015   Chronic low back pain 03/13/2015   CAD S/P percutaneous coronary angioplasty 10/31/2013   Incidental lung nodule, > 3mm and < 8mm, Left lower lobe 7mm - June 2014, April 2015 unchanged 06/25/2013   OSA on CPAP 06/21/2013   Myalgia and myositis 05/02/2013   Muscle ache 04/03/2013   Class 3 severe obesity with serious comorbidity and body mass index (BMI) of 40.0 to 44.9 in adult (HCC) 01/24/2013   Large hiatal hernia 08/24/2012    LLL nodule 6mm - June 2014, described in 2003 07/13/2012   Asthma, chronic 06/29/2012   Migraine without aura 03/11/2012   SOB (shortness of breath) 12/28/2011   Hx of fibroids 08/03/2011   Knee pain, left 12/25/2010   NECK PAIN 12/03/2009   TINNITUS 11/22/2007   HEARING LOSS 11/22/2007   Chronic pain of both knees 11/22/2007   Gastroesophageal reflux disease without esophagitis 07/20/2007   NASH (nonalcoholic  steatohepatitis) 07/20/2007   ANXIETY 10/22/2006   Allergic rhinitis 10/22/2006   Osteoarthritis 10/22/2006    PCP: Pincus Sanes, MD  REFERRING PROVIDER: Judi Saa, DO   REFERRING DIAG: M25.561,M25.562,G89.29 (ICD-10-CM) - Chronic pain of both knees   THERAPY DIAG:  Chronic pain of left knee  Chronic pain of right knee  Muscle weakness (generalized)  Difficulty in walking, not elsewhere classified  Rationale for Evaluation and Treatment: Rehabilitation  ONSET DATE: 5 months  SUBJECTIVE:   SUBJECTIVE STATEMENT: Overall, the aquatics has been great. I feel much better after each aquatic session, but my improvement has been limited due to different stresses in my life. My knees feel losse, and wearing knee elastic braces help.   PERTINENT  HISTORY: High BMI, CHF, CAD, L TKR, degenerative arthritis  PAIN:  Are you having pain? Yes: NPRS scale: 1/10. Pain range 0-6/10 Pain location: Both knees Pain description: Tightness/achiness Aggravating factors: Cold, extended feet, after periods of rest Relieving factors: Occasional use of pain medication, seated elliptical Normal Range: 3-6/10  PRECAUTIONS: None  WEIGHT BEARING RESTRICTIONS: No  FALLS:  Has patient fallen in last 6 months? 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, reads, church-sings in choir  PLOF: Independent  PATIENT GOALS: Pt be Ind c better quality of movement  NEXT MD VISIT: Next week  OBJECTIVE:   DIAGNOSTIC FINDINGS:  DG L knee 07/10/22 L knee Narrative: Well-seated prosthesis without complication   PATIENT SURVEYS:  FOTO: Perceived function   47%, predicted   56%  09/14/22: 43%  COGNITION: Overall cognitive status:  Within functional limits for tasks assessed     SENSATION: WFL  EDEMA:  Swelling of both legs c pitting edema of ankle at sock line  MUSCLE LENGTH: Hamstrings: Right 40 deg; Left 40 deg Thomas test: Right Tight deg; Left tight deg  POSTURE: rounded shoulders, forward head, and increased thoracic kyphosis  PALPATION: Not overly TTP of the knees  LOWER EXTREMITY ROM:  Active ROM Right eval Left eval Right 09/14/22 Left 09/14/22  Hip flexion      Hip extension      Hip abduction      Hip adduction      Hip internal rotation      Hip external rotation      Knee flexion 95 100 98 104  Knee extension 10 0    Ankle dorsiflexion      Ankle plantarflexion      Ankle inversion      Ankle eversion       (Blank rows = not tested)  LOWER EXTREMITY MMT:  MMT Right eval Left eval Right 09/14/22 Left  09/14/22 RT 8/724 LT 10/07/22  Hip flexion 4- 4- 4 4 4 4   Hip extension 3 3      Hip abduction 4- 4- 4 4- 4 4-  Hip adduction        Hip internal rotation        Hip external rotation 4 4      Knee flexion 4+ 4+   4+ 4+  Knee extension 5 5   5 5   Ankle dorsiflexion        Ankle plantarflexion        Ankle inversion        Ankle eversion         (Blank rows = not tested)  LOWER EXTREMITY SPECIAL TESTS:    For the R knee: Knee special tests: Anterior drawer test: negative, Posterior drawer test: negative, and McMurray's test: negative  FUNCTIONAL TESTS:  5 times sit to stand: 23.9 2 minute walk test: 370' 09/14/22: 5 x STS   24.3 sec with UE, bariatric chair 09/14/22: 2 MWT: 424 feet   Single leg stance: 1" 09/14/22: 9 sec SLS , each leg    GAIT: Distance walked: 370' Assistive device utilized: None Level of assistance: Complete Independence Comments: Slower pace    TODAY'S TREATMENT:  [facility] Adult PT Treatment:  DATE: 10/07/22 Therapeutic Exercise: Nustep L4 UE/LE Active Straight Leg Raise with Quad Set 10 reps  - 3 hold Hooklying Clamshell with GTB 10 reps - 3 hold Sidelying Hip Abduction 10 reps - 3 hold Sit to Stand with Counter Support 10 reps - 3 hold Supine Gluteus and Hamstring Sets on Swiss Ball 10 reps - 5 hold Prone Hip Extension on Table 10 reps Standing shoulder hor abd star pattern 2x5 RTB Update HEP  [facility] Adult PT Treatment:                                                DATE: 10/02/22 Aquatic therapy at [facility]- [address] - therapeutic pool temp approximately 92 degrees. Pt enters building ambulating without AD and aide of another person. Treatment took place in water 3.8 to  4 ft 8 in.feet deep depending upon activity.  Pt entered and exited the pool via stair and handrails independently.   Aquatic Exercise: Walking forwards/backwards/sidestepping with yellow DB Runners stretch 30" BIL Runners hamstring stretch 30" BIL Step ups on bottom step x10 each fwd/lat BIL SLS with yellow DB Ham curls x 20 Squats 2x20 Marching hip flexion to knee extension x20 BIL Hip flexion/ext knee straight x20 BIL Hip abd/add x20 BIL Gentle rotation with yellow dumbells - x1'   Pt requires the buoyancy of water for active assisted exercises with buoyancy supported for strengthening and AROM exercises. Hydrostatic pressure also supports joints by unweighting joint load by at least 50 % in 3-4 feet depth water. 80% in chest to neck deep water. Water will provide assistance with movement using the current and laminar flow while the buoyancy reduces weight bearing. Pt requires the viscosity of the water for resistance with strengthening exercises.   [facility]  Adult PT Treatment:                                                DATE: 09-30-22 Aquatic therapy at [facility]- [address] - therapeutic pool temp approximately 92 degrees. Pt enters building ambulating without AD and aide of another person. Treatment took place in water 3.8 to  4 ft 8 in.feet deep depending upon activity.  Pt entered  and exited the pool via stair and handrails independently. Pain at initiation of aquatic treatment 5/10  At end 0/10    Sitting on lift chair    Bicycle kicks x1' Reverse bicycle kicks x1' Flutter kicks x1' Scissor kicks x 1' Aquatic Exercise: Runners stretch 30 " x 2 on BIL Runners hamstring stretch 30" x 2 BIL Fwd/side stepping/bkwds all with UE and Blue DB Fwd crossover walking Step ups on bottom step x10 each fwd/lat BIL Side step crossover walking Karaoke stepping with PT CGA x 1 as needed  SLS with yellow DB Ham curls x 20 Squats 2x20 Hip abd/add x20 BIL With blue noodle under arches of feet.  Toe touch and heel touch for increasing AROM of bil feet.  Tandem walking on blue noodle with UE support as needed Gentle rotation with yellow dumbells - x1' Aqua stretch of bil quads and PT assisted standing quad stretch with VC for erect posture and decrease trunk flexion    Pt requires the buoyancy of water for  active assisted exercises with buoyancy supported for strengthening and AROM exercises. Hydrostatic pressure also supports joints by unweighting joint load by at least 50 % in 3-4 feet depth water. 80% in chest to neck deep water. Water will provide assistance with movement using the current and laminar flow while the buoyancy reduces weight bearing. Pt requires the viscosity of the water for resistance with strengthening exercises.    PATIENT EDUCATION:  Education details: Eval findings, POC, HEP, self care  Person educated: Patient Education method: Explanation, Demonstration, Tactile cues, Verbal cues, and Handouts Education comprehension: verbalized understanding, returned demonstration, verbal cues required, and tactile cues required  HOME EXERCISE PROGRAM: Access Code: [email or web address] URL: [email or web address] Date: 10/07/2022 Prepared by: Joellyn Rued  Exercises - Active Straight Leg Raise with Quad Set  - 1 x daily - 7 x weekly - 2 sets - 10 reps - 3  hold - Hooklying Clamshell with Resistance  - 1 x daily - 7 x weekly - 2 sets - 10 reps - 3 hold - Sidelying Hip Abduction  - 1 x daily - 7 x weekly - 2 sets - 10 reps - 3 hold - Supine Gluteus and Hamstring Sets on Swiss Ball  - 1 x daily - 7 x weekly - 2 sets - 10 reps - 5 hold - Sit to Stand with Counter Support  - 1 x daily - 7 x weekly - 2 sets - 10 reps - 3 hold - Prone Hip Extension on Table  - 1 x daily - 7 x weekly - 2 sets - 10 reps - Standing Gastroc Stretch at Counter  - 1 x daily - 7 x weekly - 1 sets - 2-3 reps - 20 hold - Standing Shoulder Horizontal Abduction with Resistance  - 1 x daily - 7 x weekly - 2 sets - 10 reps - 2 hold  ASSESSMENT:  CLINICAL IMPRESSION: Pt presented to a land PT session and therapy was completed for strengthening of the trunk and legs. Pt walks with a forward flexed trunk posture and a postural therex was completed and added to her HEP. Pt is experiencing lower levels of bilat knee pain more consistently. Pt really enjoys aquatics. Her participation in her HEP has been limted to 1 to 2x per week. Encouraged pt to complete her HEP 4x week to better address her LE weakness which has not changed since the last assessment. The pt has 2 more PT sessions. Will determine course of future care next week. Pt will continue to benefit from skilled PT to address impairments for improved functional mobility.   OBJECTIVE IMPAIRMENTS: decreased activity tolerance, decreased balance, difficulty walking, decreased ROM, decreased strength, obesity, and pain.   ACTIVITY LIMITATIONS: carrying, lifting, bending, sitting, standing, squatting, stairs, and locomotion level  PARTICIPATION LIMITATIONS: meal prep, cleaning, laundry, community activity, and church  PERSONAL FACTORS: High BMI, CHF, CAD, L TKR, degenerative arthritis are also affecting patient's functional outcome.   REHAB POTENTIAL: Good  CLINICAL DECISION MAKING: Evolving/moderate complexity  EVALUATION  COMPLEXITY: Moderate   GOALS:  SHORT TERM GOALS: Target date: 09/04/22 Pt will be Ind in an initial HEP Baseline: started 09/14/22: can complete seated elliptical for 1 hour, 3-4 x per week, HEP 1 x per week.  Goal status: ONGOING  LONG TERM GOALS: Target date: 10/16/22  Pt will be Ind in a final HEP to maintain achieved LOF  Baseline: started Goal status: ONGOING  2.  Increase single standing for each LE to greater than 5 sec for  improved functional mobility Baseline: 1" each LE 09/14/22: 9 sec Goal status: MET  3.  Increase bilat knee and hip strength by at least 1/2 muscle strength grade for improved functional mobility  Baseline: see flow sheets 10/07/22: see flow sheet Goal status: ONGOING  4.  Improve 5xSTS by MCID of 5" and by MCID of 14ft as indication of improved functional mobility  Baseline: 5xSTS c use of hands from bari-chair 23.9". 370" 09/14/22: 5 x STS 24.3 sec, 2 MWT 424 feet  Goal status: Partially met   5.  Pt's FOTO score will improved to the predicted value of 56% as indication of improved function  Baseline: 47% 09/14/22: 44% Goal status: ONGOING PLAN:  PT FREQUENCY: 2x/week  PT DURATION: 8 weeks  PLANNED INTERVENTIONS: Therapeutic exercises, Therapeutic activity, Neuromuscular re-education, Balance training, Gait training, Patient/Family education, Self Care, Joint mobilization, Aquatic Therapy, Dry Needling, Electrical stimulation, Cryotherapy, Moist heat, Taping, Vasopneumatic device, Ultrasound, Ionotophoresis 4mg /ml Dexamethasone, Manual therapy, and Re-evaluation  PLAN FOR NEXT SESSION: ; assess response to HEP; progress therex as indicated; use of modalities, manual therapy; and TPDN as indicated;      PT 10/07/22 1:19 PM Phone: [phone] Fax: [phone]

## 2022-10-07 ENCOUNTER — Ambulatory Visit: Payer: Medicare Other

## 2022-10-07 DIAGNOSIS — M25561 Pain in right knee: Secondary | ICD-10-CM | POA: Diagnosis not present

## 2022-10-07 DIAGNOSIS — M6281 Muscle weakness (generalized): Secondary | ICD-10-CM | POA: Diagnosis not present

## 2022-10-07 DIAGNOSIS — M25562 Pain in left knee: Secondary | ICD-10-CM | POA: Diagnosis not present

## 2022-10-07 DIAGNOSIS — R262 Difficulty in walking, not elsewhere classified: Secondary | ICD-10-CM

## 2022-10-07 DIAGNOSIS — G8929 Other chronic pain: Secondary | ICD-10-CM | POA: Diagnosis not present

## 2022-10-08 ENCOUNTER — Ambulatory Visit: Payer: Medicare Other | Admitting: Family Medicine

## 2022-10-09 NOTE — Progress Notes (Unsigned)
[name] [address] [place] [address] Phone: [phone] Subjective:   [name], am serving as a scribe for Dr. Antoine Primas.  I'm seeing this patient by the request  of:  Pincus Sanes, MD  CC: Multiple complaint follow-up  VPX:[id number]  08/17/2022 Patient has not had an injection for quite some time.  We did discuss the possibility of repeating injections.  Patient wants to hold at this time.  Discussed icing regimen and home exercises.  Discussed which activities will be beneficial in which case could be detrimental.  6 to 8 weeks otherwise if worsening symptoms we could consider the possibility of injections.  Patient does physical therapy total time with patient 31 minutes      Update 10/12/2022 Heather [name] is a 77 y.o. female coming in with complaint of R knee pain. Patient states that she has one more session of PT. No pain while she is in the water. Weather seems to increase her pain. Last week she was in a lot of pain but this week she is feeling better.    Reviewing patient's chart has been in physical therapy quite regularly.  Has been doing aquatic therapy.  Past Medical History:  Diagnosis Date   Abdominal hernia    Abnormal Pap smear    ALLERGIC RHINITIS 10/22/2006   Allergy    ANEMIA 12/18/2008   Arthritis    scoliosis moderate deg changes lumbar Xray 11/04/17    Asthma    ASYMPTOMATIC POSTMENOPAUSAL STATUS 11/22/2007   Back pain    CAD (coronary artery disease)    in LAD   CHF (congestive heart failure) (HCC)    Complication of anesthesia 2010   pt states she woke up during colonoscopy and was aware of "losing control of breathing" during mammoplasty   Degenerative arthritis    Diverticulosis    Dyslipidemia    Fibroid    Frequent headaches    GERD 07/20/2007   GOITER, MULTINODULAR 07/20/2007   Headache(784.0) 07/20/2007   HEARING LOSS 11/22/2007   Hiatal hernia    Hiatal hernia    large    History of chicken pox    Hx of colposcopy with cervical biopsy    HYPERCHOLESTEROLEMIA 01/13/2007   Hyperglycemia    HYPERTENSION 10/22/2006   Lung nodule    unchanged since 05/26/13    Migraines    NASH (nonalcoholic steatohepatitis)    Nocturnal hypoxemia 02/17/2013   Obesity    Obstructive sleep apnea    OSA on CPAP    per neurology   OSTEOARTHRITIS 10/22/2006   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   Pre-diabetes    Sedimentation rate elevation    Sleep apnea    on cpap   Urine incontinence    UTI (urinary tract infection)    Past Surgical History:  Procedure Laterality Date   BREAST SURGERY  1984   Breast reduction b/l    CATARACT EXTRACTION, BILATERAL     DEXA  08/2005   DILATION AND CURETTAGE OF UTERUS     ELECTROCARDIOGRAM  10/15/2006   ESOPHAGOGASTRODUODENOSCOPY  12/08/2005   FOOT SURGERY     hammertoe and bunion 05/2017    Stress Cardiolite  10/21/2005   sweat gland removal     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   Procedure: Left TOTAL KNEE ARTHROPLASTY;  Surgeon: Kathryne Hitch, MD;  Location: [facility] OR;  Service: Orthopedics;  Laterality: Left;   UMBILICAL HERNIA REPAIR N/A 04/25/2022  Procedure: OPEN INCARCERATED HERNIA REPAIR UMBILICAL WITH APPENDECTOMY;  Surgeon: Stechschulte, Hyman Hopes, MD;  Location: [facility] OR;  Service: General;  Laterality: N/A;   WISDOM TOOTH EXTRACTION     Social History   Socioeconomic History   Marital status: Married    Spouse name: Darcel Bayley   Number of children: 2   Years of education: College   Highest education level: Not on file  Occupational History   Occupation: Retired  Tobacco Use   Smoking status: Former    Current packs/day: 0.00    Average packs/day: 2.0 packs/day for 20.0 years (40.0 ttl pk-yrs)    Types: Cigarettes    Start date: 03/03/1959    Quit date: 03/03/1979    Years since quitting: 43.6   Smokeless tobacco: Never  Vaping Use   Vaping status: Never Used  Substance and Sexual Activity    Alcohol use: Yes    Comment: rare - TWICE A YR   Drug use: No   Sexual activity: Yes    Birth control/protection: Surgical  Other Topics Concern   Not on file  Social History Narrative   Patient is married Darcel Bayley).   Right Handed   Drinks 1 cup caffeine   Social Determinants of Health   Financial Resource Strain: Low Risk  (05/26/2022)   Overall Financial Resource Strain (CARDIA)    Difficulty of Paying Living Expenses: Not hard at all  Food Insecurity: No Food Insecurity (05/26/2022)   Hunger Vital Sign    Worried About Running Out of Food in the Last Year: Never true    Ran Out of Food in the Last Year: Never true  Transportation Needs: No Transportation Needs (05/26/2022)   PRAPARE - Administrator, Civil Service (Medical): No    Lack of Transportation (Non-Medical): No  Physical Activity: Inactive (05/26/2022)   Exercise Vital Sign    Days of Exercise per Week: 0 days    Minutes of Exercise per Session: 0 min  Stress: Patient Declined (05/26/2022)   [employer] of Occupational Health - Occupational Stress Questionnaire    Feeling of Stress : Patient declined  Social Connections: Unknown (05/26/2022)   Social Connection and Isolation Panel [NHANES]    Frequency of Communication with Friends and Family: More than three times a week    Frequency of Social Gatherings with Friends and Family: Once a week    Attends Religious Services: Not on Marketing executive or Organizations: Yes    Attends Banker Meetings: More than 4 times per year    Marital Status: Married   Allergies  Allergen Reactions   Aspirin Hives   Coconut (Cocos Nucifera) Hives   Lisinopril Cough   Metoprolol Itching   Other     Latex paint, the smell causes hives. Not allergic to latex gloves   General anesthesia - has had issues in past where anesthesia was not properly administered and caused side effects   Peanut-Containing Drug Products Hives, Itching and  Other (See Comments)   Prednisone     Unknown reaction    Strawberry Extract Hives   Penicillins Rash   Family History  Problem Relation Age of Onset   Asthma Mother    Depression Mother    Bipolar disorder Mother    Dementia Mother    Arthritis Mother    Breast cancer Mother        primary   Colon cancer Mother  mets from breast   Cancer Mother        colon   Hypertension Father    Migraines Father    Cancer Maternal Aunt        ?   Heart disease Maternal Grandmother    Cancer Maternal Grandfather        stomach ?   Sleep apnea Neg Hx    Esophageal cancer Neg Hx    Stomach cancer Neg Hx    Rectal cancer Neg Hx      Current Outpatient Medications (Cardiovascular):    ezetimibe (ZETIA) 10 MG tablet, Take 1 tablet (10 mg total) by mouth daily.   furosemide (LASIX) 20 MG tablet, Take 1 tablet (20 mg total) by mouth 2 (two) times daily.   rosuvastatin (CRESTOR) 40 MG tablet, Take 1 tablet (40 mg total) by mouth daily. At night  Current Outpatient Medications (Respiratory):    cetirizine (ZYRTEC) 10 MG tablet, Take 10 mg by mouth daily.  Current Outpatient Medications (Analgesics):    acetaminophen (TYLENOL) 500 MG tablet, Take 1,000 mg by mouth 3 (three) times daily as needed for moderate pain.   traMADol (ULTRAM) 50 MG tablet, Take 1 tablet (50 mg total) by mouth 2 (two) times daily as needed.  Current Outpatient Medications (Hematological):    clopidogrel (PLAVIX) 75 MG tablet, TAKE 1 TABLET DAILY (Patient taking differently: Take 75 mg by mouth daily.)   Cyanocobalamin 1000 MCG CAPS, Take 1 capsule by mouth daily.   Ferrous Sulfate (IRON) 325 (65 FE) MG TABS, Take 1 tablet by mouth daily.  Current Outpatient Medications (Other):    Boswellia-Glucosamine-Vit D (OSTEO BI-FLEX ONE PER DAY PO), Take 1 tablet by mouth daily.   Calcium Carb-Cholecalciferol (CHEWABLE CALCIUM/D3 PO), Take 1 tablet by mouth daily.   Coenzyme Q10 (CO Q 10 PO), Take 400 mg by mouth.    gabapentin (NEURONTIN) 100 MG capsule, TAKE 3 CAPSULES BY MOUTH AT BEDTIME   Multiple Vitamin (MULTIVITAMIN) tablet, Take 1 tablet by mouth daily.   Omega-3 Fatty Acids (FISH OIL PO), Take 2 g by mouth daily.   pantoprazole (PROTONIX) 20 MG tablet, Take 1 tablet (20 mg total) by mouth 2 (two) times daily. 30 min before food   polyethylene glycol (MIRALAX / GLYCOLAX) 17 g packet, Take 17 g by mouth every other day.   potassium chloride SA (KLOR-CON M20) 20 MEQ tablet, Take 1 tablet (20 mEq total) by mouth daily.   pyridoxine (B-6) 200 MG tablet, Take 200 mg by mouth daily.   solifenacin (VESICARE) 10 MG tablet, Take 1 tablet (10 mg total) by mouth daily.   topiramate (TOPAMAX) 25 MG tablet, Take 75 mg by mouth at bedtime.   TURMERIC PO, Take 538 mg by mouth daily.   Vitamin D, Ergocalciferol, (DRISDOL) 1.25 MG (50000 UNIT) CAPS capsule, Take 1 capsule (50,000 Units total) by mouth every 7 (seven) days.   vitamin E 200 UNIT capsule, Take 200 Units by mouth daily.   Reviewed prior external information including notes and imaging from  primary care provider As well as notes that were available from care everywhere and other healthcare systems.  Past medical history, social, surgical and family history all reviewed in electronic medical record.  No pertanent information unless stated regarding to the chief complaint.    Objective  Blood pressure 112/74, pulse 65, height 5\' 4"  (1.626 m), weight 210 lb (95.3 kg), SpO2 94%.   General: No apparent distress alert and oriented x3 mood and affect normal,  dressed appropriately.  HEENT: Pupils equal, extraocular movements intact  Respiratory: Patient's speak in full sentences and does not appear short of breath  Antalgic gait noted Right knee exam shows trace effusion noted.  Lacking the last 15 degrees of flexion.  No crepitus noted. Patient does have breakdown of the transverse arch noted bilaterally. Antalgic gait noted. Foot exam does show a  transverse primary breakdown noted  Does have fibular deviation of the large toe on the left foot.  Postsurgical changes on the right foot. Impression and Recommendations:     The above documentation has been reviewed and is accurate and complete Judi Saa, DO

## 2022-10-12 ENCOUNTER — Ambulatory Visit (INDEPENDENT_AMBULATORY_CARE_PROVIDER_SITE_OTHER): Payer: Medicare Other | Admitting: Family Medicine

## 2022-10-12 VITALS — BP 112/74 | HR 65 | Ht 64.0 in | Wt 210.0 lb

## 2022-10-12 DIAGNOSIS — M170 Bilateral primary osteoarthritis of knee: Secondary | ICD-10-CM

## 2022-10-12 DIAGNOSIS — M216X9 Other acquired deformities of unspecified foot: Secondary | ICD-10-CM | POA: Diagnosis not present

## 2022-10-12 NOTE — Assessment & Plan Note (Addendum)
Replacement on the left side.  Still has some swelling on the right side.  Still avoid certain activities.  Patient has done relatively well with the weight loss at the moment.  Patient's BMI is under 40 and is getting closer to 35.  Encourage patient to continue to monitor this.  Follow-up with me again in 6 to 8 weeks otherwise.

## 2022-10-12 NOTE — Assessment & Plan Note (Signed)
Discussed with patient that [employer] total support  [employer] orthotics "total support" online would be great   Patient will try to get these in her other shoes.  Follow-up with me again 2 months..  Custom orthotics if she continues to have difficulty.  Postsurgical changes on the right side did make some other changes difficult.

## 2022-10-12 NOTE — Patient Instructions (Signed)
Good to see you [employer] Total Support Original Orthotics Knee looks good Check back in 2-3 months

## 2022-10-13 NOTE — Therapy (Signed)
PHYSICAL THERAPY TREATMENT NOTE   Patient Name: Heather [name] MRN: [id number] DOB:[birth year], 77 y.o., female Today's Date: 10/14/2022  END OF SESSION:  PT End of Session - 10/14/22 1233     Visit Number 14    Number of Visits 17    Date for PT Re-Evaluation 10/16/22    Authorization Type MEDICARE PART A AND B    Progress Note Due on Visit 10    PT Start Time 1230    PT Stop Time 1317    PT Time Calculation (min) 47 min    Activity Tolerance Patient tolerated treatment well    Behavior During Therapy [facility], [place] for tasks assessed/performed                  Past Medical History:  Diagnosis Date   Abdominal hernia    Abnormal Pap smear    ALLERGIC RHINITIS 10/22/2006   Allergy    ANEMIA 12/18/2008   Arthritis    scoliosis moderate deg changes lumbar Xray 11/04/17    Asthma    ASYMPTOMATIC POSTMENOPAUSAL STATUS 11/22/2007   Back pain    CAD (coronary artery disease)    in LAD   CHF (congestive heart failure) (HCC)    Complication of anesthesia 2010   pt states she woke up during colonoscopy and was aware of "losing control of breathing" during mammoplasty   Degenerative arthritis    Diverticulosis    Dyslipidemia    Fibroid    Frequent headaches    GERD 07/20/2007   GOITER, MULTINODULAR 07/20/2007   Headache(784.0) 07/20/2007   HEARING LOSS 11/22/2007   Hiatal hernia    Hiatal hernia    large   History of chicken pox    Hx of colposcopy with cervical biopsy    HYPERCHOLESTEROLEMIA 01/13/2007   Hyperglycemia    HYPERTENSION 10/22/2006   Lung nodule    unchanged since 05/26/13    Migraines    NASH (nonalcoholic steatohepatitis)    Nocturnal hypoxemia 02/17/2013   Obesity    Obstructive sleep apnea    OSA on CPAP    per neurology   OSTEOARTHRITIS 10/22/2006   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   Pre-diabetes    Sedimentation rate elevation    Sleep apnea    on cpap   Urine incontinence    UTI (urinary tract infection)    Past  Surgical History:  Procedure Laterality Date   BREAST SURGERY  1984   Breast reduction b/l    CATARACT EXTRACTION, BILATERAL     DEXA  08/2005   DILATION AND CURETTAGE OF UTERUS     ELECTROCARDIOGRAM  10/15/2006   ESOPHAGOGASTRODUODENOSCOPY  12/08/2005   FOOT SURGERY     hammertoe and bunion 05/2017    Stress Cardiolite  10/21/2005   sweat gland removal     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   Procedure: Left TOTAL KNEE ARTHROPLASTY;  Surgeon: Kathryne Hitch, MD;  Location: [facility] OR;  Service: Orthopedics;  Laterality: Left;   UMBILICAL HERNIA REPAIR N/A 04/25/2022   Procedure: OPEN INCARCERATED HERNIA REPAIR UMBILICAL WITH APPENDECTOMY;  Surgeon: Quentin Ore, MD;  Location: [facility] OR;  Service: General;  Laterality: N/A;   WISDOM TOOTH EXTRACTION     Patient Active Problem List   Diagnosis Date Noted   Loss of transverse plantar arch 10/12/2022   Other fatigue 09/29/2022   Urge incontinence 08/04/2022   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 06/16/2022   Hypotension 05/26/2022  BMI 34.0-34.9,adult 05/06/2022   Obesity, Beginning BMI 44.60 05/06/2022   SBO (small bowel obstruction) (HCC) 04/25/2022   Obstructive sleep apnea treated with BiPAP 04/25/2022   Dependence on nocturnal oxygen therapy 04/25/2022   Hyponatremia 04/25/2022   Generalized abdominal pain 04/23/2022   BMI 36.0-36.9,adult 04/08/2022   Obesity, Beginning BMI 44.60 04/08/2022   Persistent cough for 3 weeks or longer 02/13/2022   Type 2 diabetes mellitus with other specified complication (HCC) 12/10/2021   Traumatic tear of supraspinatus tendon of right shoulder 11/24/2021   Right arm pain 11/18/2021   Positive self-administered antigen test for COVID-19 11/09/2021   Stress 10/16/2021   Status post total left knee replacement 03/11/2021   Chronic pain 01/06/2021   Lumbar radiculopathy 07/04/2020   Bilateral sciatica 07/04/2020   Pain in both hands  07/04/2020   Hyperlipidemia associated with type 2 diabetes mellitus (HCC) 05/01/2020   Abnormal blood level of copper 03/14/2020   Leukopenia 03/14/2020   Obesity (BMI 30-39.9) 11/13/2019   Chronic pain of right thumb 10/11/2019   Vitamin D deficiency 07/27/2019   Memory loss 04/27/2019   Mass of left wrist 01/18/2019   Ventral hernia without obstruction or gangrene 01/18/2019   Mass of right thigh 09/13/2018   Depression 08/02/2018   Sleeps in sitting position due to orthopnea 07/13/2018   BiPAP (biphasic positive airway pressure) dependence 05/24/2018   Goiter 04/15/2017   Urinary incontinence 04/15/2017   Type 2 diabetes mellitus without complication, without long-term current use of insulin (HCC) 02/15/2017   Bilateral leg cramps 01/05/2017   Peripheral neuropathy 05/27/2016   Elevated sed rate 03/05/2016   Muscle spasm of both lower legs 02/12/2016   Leg edema 02/12/2016   Iron deficiency anemia 02/12/2016   Hypokalemia 02/12/2016   Pernicious anemia 02/12/2016   Degenerative disc disease, lumbar 04/09/2015   Degenerative arthritis of knee, bilateral 03/13/2015   Chronic low back pain 03/13/2015   CAD S/P percutaneous coronary angioplasty 10/31/2013   Incidental lung nodule, > 3mm and < 8mm, Left lower lobe 7mm - June 2014, April 2015 unchanged 06/25/2013   OSA on CPAP 06/21/2013   Myalgia and myositis 05/02/2013   Muscle ache 04/03/2013   Class 3 severe obesity with serious comorbidity and body mass index (BMI) of 40.0 to 44.9 in adult (HCC) 01/24/2013   Large hiatal hernia 08/24/2012    LLL nodule 6mm - June 2014, described in 2003 07/13/2012   Asthma, chronic 06/29/2012   Migraine without aura 03/11/2012   SOB (shortness of breath) 12/28/2011   Hx of fibroids 08/03/2011   Knee pain, left 12/25/2010   NECK PAIN 12/03/2009   TINNITUS 11/22/2007   HEARING LOSS 11/22/2007   Chronic pain of both knees 11/22/2007   Gastroesophageal reflux disease without esophagitis  07/20/2007   NASH (nonalcoholic steatohepatitis) 07/20/2007   ANXIETY 10/22/2006   Allergic rhinitis 10/22/2006   Osteoarthritis 10/22/2006    PCP: Pincus Sanes, MD  REFERRING PROVIDER: Judi Saa, DO   REFERRING DIAG: M25.561,M25.562,G89.29 (ICD-10-CM) - Chronic pain of both knees   THERAPY DIAG:  Chronic pain of left knee  Chronic pain of right knee  Muscle weakness (generalized)  Difficulty in walking, not elsewhere classified  Rationale for Evaluation and Treatment: Rehabilitation  ONSET DATE: 5 months  SUBJECTIVE:   SUBJECTIVE STATEMENT: Overall, the aquatics has been great. Pt reports 0/10 pain today entering pool ad 0/10 pain exiting pool. I feel much better after each aquatic session.  PERTINENT HISTORY: High BMI, CHF, CAD,  L TKR, degenerative arthritis  PAIN:  Are you having pain? Yes: NPRS scale: 1/10. Pain range 0-6/10 Pain location: Both knees Pain description: Tightness/achiness Aggravating factors: Cold, extended feet, after periods of rest Relieving factors: Occasional use of pain medication, seated elliptical Normal Range: 3-6/10  PRECAUTIONS: None  WEIGHT BEARING RESTRICTIONS: No  FALLS:  Has patient fallen in last 6 months? 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, reads, church-sings in choir  PLOF: Independent  PATIENT GOALS: Pt be Ind c better quality of movement  NEXT MD VISIT: Next week  OBJECTIVE:   DIAGNOSTIC FINDINGS:  DG L knee 07/10/22 L knee Narrative: Well-seated prosthesis without complication   PATIENT SURVEYS:  FOTO: Perceived function   47%, predicted   56%  09/14/22: 43%  COGNITION: Overall cognitive status: Within functional limits for tasks  assessed     SENSATION: WFL  EDEMA:  Swelling of both legs c pitting edema of ankle at sock line  MUSCLE LENGTH: Hamstrings: Right 40 deg; Left 40 deg Thomas test: Right Tight deg; Left tight deg  POSTURE: rounded shoulders, forward head, and increased thoracic kyphosis  PALPATION: Not overly TTP of the knees  LOWER EXTREMITY ROM:  Active ROM Right eval Left eval Right 09/14/22 Left 09/14/22  Hip flexion      Hip extension      Hip abduction      Hip adduction      Hip internal rotation      Hip external rotation      Knee flexion 95 100 98 104  Knee extension 10 0    Ankle dorsiflexion      Ankle plantarflexion      Ankle inversion      Ankle eversion       (Blank rows = not tested)  LOWER EXTREMITY MMT:  MMT Right eval Left eval Right 09/14/22 Left  09/14/22 RT 8/724 LT 10/07/22  Hip flexion 4- 4- 4 4 4 4   Hip extension 3 3      Hip abduction 4- 4- 4 4- 4 4-  Hip adduction        Hip internal rotation        Hip external rotation 4 4      Knee flexion 4+ 4+   4+ 4+  Knee extension 5 5   5 5   Ankle dorsiflexion        Ankle plantarflexion        Ankle inversion        Ankle eversion         (Blank rows = not tested)  LOWER EXTREMITY SPECIAL TESTS:    For the R knee: Knee special tests: Anterior drawer test: negative, Posterior drawer test: negative, and McMurray's test: negative  FUNCTIONAL TESTS:  5 times sit to stand: 23.9 2 minute walk test: 370' 09/14/22: 5 x STS   24.3 sec with UE, bariatric chair 09/14/22: 2 MWT: 424 feet   Single leg stance: 1" 09/14/22: 9 sec SLS , each leg    GAIT: Distance walked: 370' Assistive device utilized: None Level of assistance: Complete Independence Comments: Slower pace    TODAY'S TREATMENT:  [facility] Adult PT Treatment:  DATE: 10-14-22 Aquatic therapy at [facility] [address] - therapeutic pool temp approximately 92 degrees. Pt enters building  ambulating without AD and aide of another person. Treatment took place in water 3.8 to  4 ft 8 in.feet deep depending upon activity.  Pt entered and exited the pool via stair and handrails independently.  Pt entering pool with 0/10 pain level and exiting pool 0/10 Pt given written handout for general aquatic exericises Aquatic Exercise: Walking forwards/backwards/sidestepping with yellow DB Balance SL with and without UE support Step ups on bottom step x10 each fwd/lat BIL Marching hip flexion to knee extension x20 BIL Hip flexion/ext knee straight x20 BIL Ham curls x 20 Hip abd/add x20 BIL Gentle rotation with yellow dumbells - x1' Squats 2x20 Reverse lunge with UE support Heel raises  Ham curls Hip abduction and CCW/CW circles   Pt requires the buoyancy of water for active assisted exercises with buoyancy supported for strengthening and AROM exercises. Hydrostatic pressure also supports joints by unweighting joint load by at least 50 % in 3-4 feet depth water. 80% in chest to neck deep water. Water will provide assistance with movement using the current and laminar flow while the buoyancy reduces weight bearing. Pt requires the viscosity of the water for resistance with strengthening exercises.  [facility] Adult PT Treatment:                                                DATE: 10/07/22 Therapeutic Exercise: Nustep L4 UE/LE Active Straight Leg Raise with Quad Set 10 reps - 3 hold Hooklying Clamshell with GTB 10 reps - 3 hold Sidelying Hip Abduction 10 reps - 3 hold Sit to Stand with Counter Support 10 reps - 3 hold Supine Gluteus and Hamstring Sets on Swiss Ball 10 reps - 5 hold Prone Hip Extension on Table 10 reps Standing shoulder hor abd star pattern 2x5 RTB Update HEP  [facility] Adult PT Treatment:                                                DATE: 10/02/22 Aquatic therapy at [facility] [address] - therapeutic pool temp approximately 92 degrees. Pt enters building ambulating  without AD and aide of another person. Treatment took place in water 3.8 to  4 ft 8 in.feet deep depending upon activity.  Pt entered and exited the pool via stair and handrails independently.   Aquatic Exercise: Walking forwards/backwards/sidestepping with yellow DB Runners stretch 30" BIL Runners hamstring stretch 30" BIL Step ups on bottom step x10 each fwd/lat BIL SLS with yellow DB Ham curls x 20 Squats 2x20 Marching hip flexion to knee extension x20 BIL Hip flexion/ext knee straight x20 BIL Hip abd/add x20 BIL Gentle rotation with yellow dumbells - x1'   Pt requires the buoyancy of water for active assisted exercises with buoyancy supported for strengthening and AROM exercises. Hydrostatic pressure also supports joints by unweighting joint load by at least 50 % in 3-4 feet depth water. 80% in chest to neck deep water. Water will provide assistance with movement using the current and laminar flow while the buoyancy reduces weight bearing. Pt requires the viscosity of the water for resistance with strengthening exercises.   [facility]  Adult PT Treatment:                                                DATE: 09-30-22 Aquatic therapy at [facility] [address] - therapeutic pool temp approximately 92 degrees. Pt enters building ambulating without AD and aide of another person. Treatment took place in water 3.8 to  4 ft 8 in.feet deep depending upon activity.  Pt entered and exited the pool via stair and handrails independently. Pain at initiation of aquatic treatment 5/10  At end 0/10    Sitting on lift chair    Bicycle kicks x1' Reverse bicycle kicks x1' Flutter kicks x1' Scissor kicks x 1' Aquatic Exercise: Runners stretch 30 " x 2 on BIL Runners hamstring stretch 30" x 2 BIL Fwd/side stepping/bkwds all with UE and Blue DB Fwd crossover walking Step ups on bottom step x10 each fwd/lat BIL Side step crossover walking Karaoke stepping with PT CGA x 1 as needed  SLS with yellow  DB Ham curls x 20 Squats 2x20 Hip abd/add x20 BIL With blue noodle under arches of feet.  Toe touch and heel touch for increasing AROM of bil feet.  Tandem walking on blue noodle with UE support as needed Gentle rotation with yellow dumbells - x1' Aqua stretch of bil quads and PT assisted standing quad stretch with VC for erect posture and decrease trunk flexion    Pt requires the buoyancy of water for active assisted exercises with buoyancy supported for strengthening and AROM exercises. Hydrostatic pressure also supports joints by unweighting joint load by at least 50 % in 3-4 feet depth water. 80% in chest to neck deep water. Water will provide assistance with movement using the current and laminar flow while the buoyancy reduces weight bearing. Pt requires the viscosity of the water for resistance with strengthening exercises.    PATIENT EDUCATION:  Education details: Eval findings, POC, HEP, self care  Person educated: Patient Education method: Explanation, Demonstration, Tactile cues, Verbal cues, and Handouts Education comprehension: verbalized understanding, returned demonstration, verbal cues required, and tactile cues required  HOME EXERCISE PROGRAM: Access Code: [email or web address] URL: [email or web address] Date: 10/07/2022 Prepared by: Joellyn Rued  Exercises - Active Straight Leg Raise with Quad Set  - 1 x daily - 7 x weekly - 2 sets - 10 reps - 3 hold - Hooklying Clamshell with Resistance  - 1 x daily - 7 x weekly - 2 sets - 10 reps - 3 hold - Sidelying Hip Abduction  - 1 x daily - 7 x weekly - 2 sets - 10 reps - 3 hold - Supine Gluteus and Hamstring Sets on Swiss Ball  - 1 x daily - 7 x weekly - 2 sets - 10 reps - 5 hold - Sit to Stand with Counter Support  - 1 x daily - 7 x weekly - 2 sets - 10 reps - 3 hold - Prone Hip Extension on Table  - 1 x daily - 7 x weekly - 2 sets - 10 reps - Standing Gastroc Stretch at Counter  - 1 x daily - 7 x weekly - 1 sets - 2-3 reps  - 20 hold - Standing Shoulder Horizontal Abduction with Resistance  - 1 x daily - 7 x weekly - 2 sets - 10 reps - 2 hold  ASSESSMENT:  CLINICAL IMPRESSION: Ms Spohn presents  to aquatic PT session reporting no current pain in the knees and exits pool 0/10 pain.  Pt reports she will be entering a busy season and pt reminded to prioritize her own exercise and care when also caring for others. Pt plans to continue Aquatic exercise at local community pool either at [facility] or [facility]. Session today focused on LE strengthening and balance tasks in the aquatic environment for use of buoyancy to offload joints and the viscosity of water as resistance during therapeutic exercise. Patient was able to tolerate all prescribed exercises in the aquatic environment with no adverse effects. Patient continues to benefit from skilled PT services on land and aquatic based and should be progressed as able to improve functional independence. Pt given HEP for aquatic on written handout distributed by PT  OBJECTIVE IMPAIRMENTS: decreased activity tolerance, decreased balance, difficulty walking, decreased ROM, decreased strength, obesity, and pain.   ACTIVITY LIMITATIONS: carrying, lifting, bending, sitting, standing, squatting, stairs, and locomotion level  PARTICIPATION LIMITATIONS: meal prep, cleaning, laundry, community activity, and church  PERSONAL FACTORS: High BMI, CHF, CAD, L TKR, degenerative arthritis are also affecting patient's functional outcome.   REHAB POTENTIAL: Good  CLINICAL DECISION MAKING: Evolving/moderate complexity  EVALUATION COMPLEXITY: Moderate   GOALS:  SHORT TERM GOALS: Target date: 09/04/22 Pt will be Ind in an initial HEP Baseline: started 09/14/22: can complete seated elliptical for 1 hour, 3-4 x per week, HEP 1 x per week.  Goal status: ONGOING  LONG TERM GOALS: Target date: 10/16/22  Pt will be Ind in a final HEP to maintain achieved LOF  Baseline: started Goal status:  ONGOING  2.  Increase single standing for each LE to greater than 5 sec for improved functional mobility Baseline: 1" each LE 09/14/22: 9 sec Goal status: MET  3.  Increase bilat knee and hip strength by at least 1/2 muscle strength grade for improved functional mobility  Baseline: see flow sheets 10/07/22: see flow sheet Goal status: ONGOING  4.  Improve 5xSTS by MCID of 5" and by MCID of 43ft as indication of improved functional mobility  Baseline: 5xSTS c use of hands from bari-chair 23.9". 370" 09/14/22: 5 x STS 24.3 sec, 2 MWT 424 feet  Goal status: Partially met   5.  Pt's FOTO score will improved to the predicted value of 56% as indication of improved function  Baseline: 47% 09/14/22: 44% Goal status: ONGOING PLAN:  PT FREQUENCY: 2x/week  PT DURATION: 8 weeks  PLANNED INTERVENTIONS: Therapeutic exercises, Therapeutic activity, Neuromuscular re-education, Balance training, Gait training, Patient/Family education, Self Care, Joint mobilization, Aquatic Therapy, Dry Needling, Electrical stimulation, Cryotherapy, Moist heat, Taping, Vasopneumatic device, Ultrasound, Ionotophoresis 4mg /ml Dexamethasone, Manual therapy, and Re-evaluation  PLAN FOR NEXT SESSION: ; assess response to HEP; progress therex as indicated; use of modalities, manual therapy; and TPDN as indicated;    Garen Lah, PT, ATRIC Certified Exercise Expert for the Aging Adult  10/14/22 1:25 PM Phone: [phone] Fax: [phone]

## 2022-10-14 ENCOUNTER — Encounter: Payer: Self-pay | Admitting: Physical Therapy

## 2022-10-14 ENCOUNTER — Ambulatory Visit: Payer: Medicare Other | Admitting: Physical Therapy

## 2022-10-14 DIAGNOSIS — M6281 Muscle weakness (generalized): Secondary | ICD-10-CM

## 2022-10-14 DIAGNOSIS — M25561 Pain in right knee: Secondary | ICD-10-CM | POA: Diagnosis not present

## 2022-10-14 DIAGNOSIS — R262 Difficulty in walking, not elsewhere classified: Secondary | ICD-10-CM

## 2022-10-14 DIAGNOSIS — M25562 Pain in left knee: Secondary | ICD-10-CM | POA: Diagnosis not present

## 2022-10-14 DIAGNOSIS — G8929 Other chronic pain: Secondary | ICD-10-CM

## 2022-10-15 ENCOUNTER — Encounter (INDEPENDENT_AMBULATORY_CARE_PROVIDER_SITE_OTHER): Payer: Self-pay

## 2022-10-16 ENCOUNTER — Encounter: Payer: Self-pay | Admitting: Physical Therapy

## 2022-10-16 ENCOUNTER — Ambulatory Visit: Payer: Medicare Other | Admitting: Physical Therapy

## 2022-10-16 DIAGNOSIS — M25561 Pain in right knee: Secondary | ICD-10-CM | POA: Diagnosis not present

## 2022-10-16 DIAGNOSIS — M6281 Muscle weakness (generalized): Secondary | ICD-10-CM | POA: Diagnosis not present

## 2022-10-16 DIAGNOSIS — M25562 Pain in left knee: Secondary | ICD-10-CM | POA: Diagnosis not present

## 2022-10-16 DIAGNOSIS — R262 Difficulty in walking, not elsewhere classified: Secondary | ICD-10-CM | POA: Diagnosis not present

## 2022-10-16 DIAGNOSIS — G8929 Other chronic pain: Secondary | ICD-10-CM

## 2022-10-16 NOTE — Therapy (Addendum)
PHYSICAL THERAPY TREATMENT NOTE   Patient Name: Heather [name] MRN: [id number] DOB:Jul 10, 1945, 77 y.o., female Today's Date: 10/16/2022  END OF SESSION:  PT End of Session - 10/16/22 1146     Visit Number 15    Number of Visits 17    Date for PT Re-Evaluation 10/16/22    Authorization Type MEDICARE PART A AND B    Progress Note Due on Visit 10    PT Start Time 1145    PT Stop Time 1225    PT Time Calculation (min) 40 min                  Past Medical History:  Diagnosis Date   Abdominal hernia    Abnormal Pap smear    ALLERGIC RHINITIS 10/22/2006   Allergy    ANEMIA 12/18/2008   Arthritis    scoliosis moderate deg changes lumbar Xray 11/04/17    Asthma    ASYMPTOMATIC POSTMENOPAUSAL STATUS 11/22/2007   Back pain    CAD (coronary artery disease)    in LAD   CHF (congestive heart failure) (HCC)    Complication of anesthesia 2010   pt states she woke up during colonoscopy and was aware of "losing control of breathing" during mammoplasty   Degenerative arthritis    Diverticulosis    Dyslipidemia    Fibroid    Frequent headaches    GERD 07/20/2007   GOITER, MULTINODULAR 07/20/2007   Headache(784.0) 07/20/2007   HEARING LOSS 11/22/2007   Hiatal hernia    Hiatal hernia    large   History of chicken pox    Hx of colposcopy with cervical biopsy    HYPERCHOLESTEROLEMIA 01/13/2007   Hyperglycemia    HYPERTENSION 10/22/2006   Lung nodule    unchanged since 05/26/13    Migraines    NASH (nonalcoholic steatohepatitis)    Nocturnal hypoxemia 02/17/2013   Obesity    Obstructive sleep apnea    OSA on CPAP    per neurology   OSTEOARTHRITIS 10/22/2006   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   Pre-diabetes    Sedimentation rate elevation    Sleep apnea    on cpap   Urine incontinence    UTI (urinary tract infection)    Past Surgical History:  Procedure Laterality Date   BREAST SURGERY  1984   Breast reduction b/l    CATARACT EXTRACTION,  BILATERAL     DEXA  08/2005   DILATION AND CURETTAGE OF UTERUS     ELECTROCARDIOGRAM  10/15/2006   ESOPHAGOGASTRODUODENOSCOPY  12/08/2005   FOOT SURGERY     hammertoe and bunion 05/2017    Stress Cardiolite  10/21/2005   sweat gland removal     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   Procedure: Left TOTAL KNEE ARTHROPLASTY;  Surgeon: Kathryne Hitch, MD;  Location: [facility] OR;  Service: Orthopedics;  Laterality: Left;   UMBILICAL HERNIA REPAIR N/A 04/25/2022   Procedure: OPEN INCARCERATED HERNIA REPAIR UMBILICAL WITH APPENDECTOMY;  Surgeon: Quentin Ore, MD;  Location: [facility] OR;  Service: General;  Laterality: N/A;   WISDOM TOOTH EXTRACTION     Patient Active Problem List   Diagnosis Date Noted   Loss of transverse plantar arch 10/12/2022   Other fatigue 09/29/2022   Urge incontinence 08/04/2022   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 06/16/2022   Hypotension 05/26/2022   BMI 34.0-34.9,adult 05/06/2022   Obesity, Beginning BMI 44.60 05/06/2022   SBO (small bowel obstruction) (HCC)  04/25/2022   Obstructive sleep apnea treated with BiPAP 04/25/2022   Dependence on nocturnal oxygen therapy 04/25/2022   Hyponatremia 04/25/2022   Generalized abdominal pain 04/23/2022   BMI 36.0-36.9,adult 04/08/2022   Obesity, Beginning BMI 44.60 04/08/2022   Persistent cough for 3 weeks or longer 02/13/2022   Type 2 diabetes mellitus with other specified complication (HCC) 12/10/2021   Traumatic tear of supraspinatus tendon of right shoulder 11/24/2021   Right arm pain 11/18/2021   Positive self-administered antigen test for COVID-19 11/09/2021   Stress 10/16/2021   Status post total left knee replacement 03/11/2021   Chronic pain 01/06/2021   Lumbar radiculopathy 07/04/2020   Bilateral sciatica 07/04/2020   Pain in both hands 07/04/2020   Hyperlipidemia associated with type 2 diabetes mellitus (HCC) 05/01/2020   Abnormal blood level of  copper 03/14/2020   Leukopenia 03/14/2020   Obesity (BMI 30-39.9) 11/13/2019   Chronic pain of right thumb 10/11/2019   Vitamin D deficiency 07/27/2019   Memory loss 04/27/2019   Mass of left wrist 01/18/2019   Ventral hernia without obstruction or gangrene 01/18/2019   Mass of right thigh 09/13/2018   Depression 08/02/2018   Sleeps in sitting position due to orthopnea 07/13/2018   BiPAP (biphasic positive airway pressure) dependence 05/24/2018   Goiter 04/15/2017   Urinary incontinence 04/15/2017   Type 2 diabetes mellitus without complication, without long-term current use of insulin (HCC) 02/15/2017   Bilateral leg cramps 01/05/2017   Peripheral neuropathy 05/27/2016   Elevated sed rate 03/05/2016   Muscle spasm of both lower legs 02/12/2016   Leg edema 02/12/2016   Iron deficiency anemia 02/12/2016   Hypokalemia 02/12/2016   Pernicious anemia 02/12/2016   Degenerative disc disease, lumbar 04/09/2015   Degenerative arthritis of knee, bilateral 03/13/2015   Chronic low back pain 03/13/2015   CAD S/P percutaneous coronary angioplasty 10/31/2013   Incidental lung nodule, > 3mm and < 8mm, Left lower lobe 7mm - June 2014, April 2015 unchanged 06/25/2013   OSA on CPAP 06/21/2013   Myalgia and myositis 05/02/2013   Muscle ache 04/03/2013   Class 3 severe obesity with serious comorbidity and body mass index (BMI) of 40.0 to 44.9 in adult (HCC) 01/24/2013   Large hiatal hernia 08/24/2012    LLL nodule 6mm - June 2014, described in 2003 07/13/2012   Asthma, chronic 06/29/2012   Migraine without aura 03/11/2012   SOB (shortness of breath) 12/28/2011   Hx of fibroids 08/03/2011   Knee pain, left 12/25/2010   NECK PAIN 12/03/2009   TINNITUS 11/22/2007   HEARING LOSS 11/22/2007   Chronic pain of both knees 11/22/2007   Gastroesophageal reflux disease without esophagitis 07/20/2007   NASH (nonalcoholic steatohepatitis) 07/20/2007   ANXIETY 10/22/2006   Allergic rhinitis 10/22/2006    Osteoarthritis 10/22/2006    PCP: Pincus Sanes, MD  REFERRING PROVIDER: Judi Saa, DO   REFERRING DIAG: M25.561,M25.562,G89.29 (ICD-10-CM) - Chronic pain of both knees   THERAPY DIAG:  Chronic pain of left knee  Chronic pain of right knee  Rationale for Evaluation and Treatment: Rehabilitation  ONSET DATE: 5 months  SUBJECTIVE:   SUBJECTIVE STATEMENT: Pt reports PT and aquatics have improved her mobility and flexibility.   PERTINENT HISTORY: High BMI, CHF, CAD, L TKR, degenerative arthritis  PAIN:  Are you having pain? Yes: NPRS scale: 0/10. Pain range 0-6/10 Pain location: Both knees Pain description: Tightness/achiness Aggravating factors: Cold, extended feet, after periods of rest Relieving factors: Occasional use of pain medication, seated  elliptical Normal Range: 3-6/10  PRECAUTIONS: None  WEIGHT BEARING RESTRICTIONS: No  FALLS:  Has patient fallen in last 6 months? 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, reads, church-sings in choir  PLOF: Independent  PATIENT GOALS: Pt be Ind c better quality of movement  NEXT MD VISIT: Next week  OBJECTIVE:   DIAGNOSTIC FINDINGS:  DG L knee 07/10/22 L knee Narrative: Well-seated prosthesis without complication   PATIENT SURVEYS:  FOTO: Perceived function   47%, predicted   56%  09/14/22: 43% 10/16/22: 57%  COGNITION: Overall cognitive status: Within functional limits for tasks assessed     SENSATION: WFL  EDEMA:  Swelling of both legs c pitting edema of ankle at sock line  MUSCLE LENGTH: Hamstrings: Right 40 deg; Left 40 deg Thomas test: Right Tight deg; Left tight deg  POSTURE: rounded shoulders, forward head, and increased  thoracic kyphosis  PALPATION: Not overly TTP of the knees  LOWER EXTREMITY ROM:  Active ROM Right eval Left eval Right 09/14/22 Left 09/14/22  Hip flexion      Hip extension      Hip abduction      Hip adduction      Hip internal rotation      Hip external rotation      Knee flexion 95 100 98 104  Knee extension 10 0    Ankle dorsiflexion      Ankle plantarflexion      Ankle inversion      Ankle eversion       (Blank rows = not tested)  LOWER EXTREMITY MMT:  MMT Right eval Left eval Right 09/14/22 Left  09/14/22 RT 8/724 LT 10/07/22 RT 10/16/22 LT 10/16/22  Hip flexion 4- 4- 4 4 4 4 4 4   Hip extension 3 3        Hip abduction 4- 4- 4 4- 4 4- 4 4  Hip adduction          Hip internal rotation          Hip external rotation 4 4        Knee flexion 4+ 4+   4+ 4+    Knee extension 5 5   5 5     Ankle dorsiflexion          Ankle plantarflexion          Ankle inversion          Ankle eversion           (Blank rows = not tested)  LOWER EXTREMITY SPECIAL TESTS:    For the R knee: Knee special tests: Anterior drawer test: negative, Posterior drawer test: negative, and McMurray's test: negative  FUNCTIONAL TESTS:  5 times sit to stand: 23.9 2 minute walk test: 370' ;  09/14/22: 5 x STS   24.3 sec with UE, bariatric chair 09/14/22: 2 MWT: 424 feet   Single leg stance: 1" 09/14/22: 9 sec SLS , each leg    GAIT: Distance walked: 370' Assistive device utilized: None Level of assistance: Complete Independence Comments: Slower pace    TODAY'S TREATMENT:  [facility] Adult PT Treatment:  DATE: 10/16/22 Therapeutic Exercise: S/L hip abdct S/L clam Bridge  SLR   Therapeutic Activity: FOTO 5 x STS    [facility] Adult PT Treatment:                                                DATE: 10-14-22 Aquatic therapy at [facility]- [address] - therapeutic pool temp approximately 92 degrees. Pt enters building ambulating without AD  and aide of another person. Treatment took place in water 3.8 to  4 ft 8 in.feet deep depending upon activity.  Pt entered and exited the pool via stair and handrails independently.  Pt entering pool with 0/10 pain level and exiting pool 0/10 Pt given written handout for general aquatic exericises Aquatic Exercise: Walking forwards/backwards/sidestepping with yellow DB Balance SL with and without UE support Step ups on bottom step x10 each fwd/lat BIL Marching hip flexion to knee extension x20 BIL Hip flexion/ext knee straight x20 BIL Ham curls x 20 Hip abd/add x20 BIL Gentle rotation with yellow dumbells - x1' Squats 2x20 Reverse lunge with UE support Heel raises  Ham curls Hip abduction and CCW/CW circles   Pt requires the buoyancy of water for active assisted exercises with buoyancy supported for strengthening and AROM exercises. Hydrostatic pressure also supports joints by unweighting joint load by at least 50 % in 3-4 feet depth water. 80% in chest to neck deep water. Water will provide assistance with movement using the current and laminar flow while the buoyancy reduces weight bearing. Pt requires the viscosity of the water for resistance with strengthening exercises.  [facility] Adult PT Treatment:                                                DATE: 10/07/22 Therapeutic Exercise: Nustep L4 UE/LE Active Straight Leg Raise with Quad Set 10 reps - 3 hold Hooklying Clamshell with GTB 10 reps - 3 hold Sidelying Hip Abduction 10 reps - 3 hold Sit to Stand with Counter Support 10 reps - 3 hold Supine Gluteus and Hamstring Sets on Swiss Ball 10 reps - 5 hold Prone Hip Extension on Table 10 reps Standing shoulder hor abd star pattern 2x5 RTB Update HEP      PATIENT EDUCATION:  Education details: Eval findings, POC, HEP, self care  Person educated: Patient Education method: Explanation, Demonstration, Tactile cues, Verbal cues, and Handouts Education comprehension: verbalized  understanding, returned demonstration, verbal cues required, and tactile cues required  HOME EXERCISE PROGRAM: Access Code: [email or web address] URL: [email or web address] Date: 10/07/2022 Prepared by: Joellyn Rued  Exercises - Active Straight Leg Raise with Quad Set  - 1 x daily - 7 x weekly - 2 sets - 10 reps - 3 hold - Hooklying Clamshell with Resistance  - 1 x daily - 7 x weekly - 2 sets - 10 reps - 3 hold - Sidelying Hip Abduction  - 1 x daily - 7 x weekly - 2 sets - 10 reps - 3 hold - Supine Gluteus and Hamstring Sets on Swiss Ball  - 1 x daily - 7 x weekly - 2 sets - 10 reps - 5 hold - Sit to Stand with Counter Support  - 1 x daily - 7 x weekly -  2 sets - 10 reps - 3 hold - Prone Hip Extension on Table  - 1 x daily - 7 x weekly - 2 sets - 10 reps - Standing Gastroc Stretch at Counter  - 1 x daily - 7 x weekly - 1 sets - 2-3 reps - 20 hold - Standing Shoulder Horizontal Abduction with Resistance  - 1 x daily - 7 x weekly - 2 sets - 10 reps - 2 hold  ASSESSMENT:  CLINICAL IMPRESSION: Ms Vanover presents to last scheduled land appt with reports of overall improvement in mobility. She has completed multiple aquatic therapy treatments which she reports are very helpful to allow exercise without pain in her joints. Objectively, she has improved all functional measures and increased her hip strength to target. Her FOTO score met the goal. She is agreeable to DC to HEP today and she plans to seek out a place to continue aquatic exercise. She is independent with HEP. All Goals Met or Partially MET.     OBJECTIVE IMPAIRMENTS: decreased activity tolerance, decreased balance, difficulty walking, decreased ROM, decreased strength, obesity, and pain.   ACTIVITY LIMITATIONS: carrying, lifting, bending, sitting, standing, squatting, stairs, and locomotion level  PARTICIPATION LIMITATIONS: meal prep, cleaning, laundry, community activity, and church  PERSONAL FACTORS: High BMI, CHF, CAD, L  TKR, degenerative arthritis are also affecting patient's functional outcome.   REHAB POTENTIAL: Good  CLINICAL DECISION MAKING: Evolving/moderate complexity  EVALUATION COMPLEXITY: Moderate   GOALS:  SHORT TERM GOALS: Target date: 09/04/22 Pt will be Ind in an initial HEP Baseline: started 09/14/22: can complete seated elliptical for 1 hour, 3-4 x per week, HEP 1 x per week.  10/16/22: sometimes completes HEP, is independent with the exercises Goal status: MET   LONG TERM GOALS: Target date: 10/16/22  Pt will be Ind in a final HEP to maintain achieved LOF  Baseline: started Goal status: MET  2.  Increase single standing for each LE to greater than 5 sec for improved functional mobility Baseline: 1" each LE 09/14/22: 9 sec Goal status: MET  3.  Increase bilat knee and hip strength by at least 1/2 muscle strength grade for improved functional mobility  Baseline: see flow sheets 10/07/22: see flow sheet 10/16/22: Hip strength improved from 4-/5 to 4/5 Goal status: PARTIALLY MET   4.  Improve 5xSTS by MCID of 5" and by MCID of 80ft as indication of improved functional mobility  Baseline: 5xSTS c use of hands from bari-chair 23.9". 370" 09/14/22: 5 x STS 24.3 sec, 2 MWT 424 feet  10/16/22: 18.6 sec 5 x STS Goal status: MET  5.  Pt's FOTO score will improved to the predicted value of 56% as indication of improved function  Baseline: 47% 09/14/22: 44% 10/16/22: 57%  Goal status: MET PLAN:  PT FREQUENCY: 2x/week  PT DURATION: 8 weeks  PLANNED INTERVENTIONS: Therapeutic exercises, Therapeutic activity, Neuromuscular re-education, Balance training, Gait training, Patient/Family education, Self Care, Joint mobilization, Aquatic Therapy, Dry Needling, Electrical stimulation, Cryotherapy, Moist heat, Taping, Vasopneumatic device, Ultrasound, Ionotophoresis 4mg /ml Dexamethasone, Manual therapy, and Re-evaluation  PLAN FOR NEXT SESSION: ; N/A , DC to HEP    Jannette Spanner,  PTA 10/16/22 1:55 PM Phone: [phone] Fax: [phone]   PHYSICAL THERAPY DISCHARGE SUMMARY  Visits from Start of Care: 15  Current functional level related to goals / functional outcomes: See clinical impression and PT goals    Remaining deficits: See clinical impression and PT goals    Education / Equipment: HEP. Pt  Ed   Patient agrees to discharge. Patient goals were met. Patient is being discharged due to being pleased with the current functional level.  Allen Ralls MS, PT 10/27/22 10:08 AM

## 2022-10-28 ENCOUNTER — Encounter (INDEPENDENT_AMBULATORY_CARE_PROVIDER_SITE_OTHER): Payer: Self-pay | Admitting: Family Medicine

## 2022-10-28 ENCOUNTER — Ambulatory Visit (INDEPENDENT_AMBULATORY_CARE_PROVIDER_SITE_OTHER): Payer: Medicare Other | Admitting: Family Medicine

## 2022-10-28 VITALS — BP 136/75 | HR 59 | Temp 98.0°F | Ht 64.0 in | Wt 210.0 lb

## 2022-10-28 DIAGNOSIS — E669 Obesity, unspecified: Secondary | ICD-10-CM | POA: Diagnosis not present

## 2022-10-28 DIAGNOSIS — E119 Type 2 diabetes mellitus without complications: Secondary | ICD-10-CM

## 2022-10-28 DIAGNOSIS — K219 Gastro-esophageal reflux disease without esophagitis: Secondary | ICD-10-CM | POA: Diagnosis not present

## 2022-10-28 DIAGNOSIS — Z6836 Body mass index (BMI) 36.0-36.9, adult: Secondary | ICD-10-CM

## 2022-10-28 DIAGNOSIS — E876 Hypokalemia: Secondary | ICD-10-CM | POA: Diagnosis not present

## 2022-10-28 DIAGNOSIS — E559 Vitamin D deficiency, unspecified: Secondary | ICD-10-CM | POA: Diagnosis not present

## 2022-10-28 MED ORDER — VITAMIN D (ERGOCALCIFEROL) 1.25 MG (50000 UNIT) PO CAPS
50000.0000 [IU] | ORAL_CAPSULE | ORAL | 0 refills | Status: DC
Start: 2022-10-28 — End: 2023-01-12

## 2022-10-29 LAB — CMP14+EGFR
ALT: 24 IU/L (ref 0–32)
AST: 29 IU/L (ref 0–40)
Albumin: 4 g/dL (ref 3.8–4.8)
Alkaline Phosphatase: 79 IU/L (ref 44–121)
BUN/Creatinine Ratio: 20 (ref 12–28)
BUN: 17 mg/dL (ref 8–27)
Bilirubin Total: 0.2 mg/dL (ref 0.0–1.2)
CO2: 24 mmol/L (ref 20–29)
Calcium: 8.9 mg/dL (ref 8.7–10.3)
Chloride: 105 mmol/L (ref 96–106)
Creatinine, Ser: 0.83 mg/dL (ref 0.57–1.00)
Globulin, Total: 3 g/dL (ref 1.5–4.5)
Glucose: 81 mg/dL (ref 70–99)
Potassium: 4.1 mmol/L (ref 3.5–5.2)
Sodium: 144 mmol/L (ref 134–144)
Total Protein: 7 g/dL (ref 6.0–8.5)
eGFR: 73 mL/min/{1.73_m2} (ref >59)

## 2022-10-29 LAB — HEMOGLOBIN A1C
Est. average glucose Bld gHb Est-mCnc: 123 mg/dL
Hgb A1c MFr Bld: 5.9 % — ABNORMAL HIGH (ref 4.8–5.6)

## 2022-10-29 LAB — VITAMIN D 25 HYDROXY (VIT D DEFICIENCY, FRACTURES): Vit D, 25-Hydroxy: 89.5 ng/mL (ref 30.0–100.0)

## 2022-10-29 NOTE — Progress Notes (Signed)
Chief Complaint:   OBESITY Heather [name] is here to discuss her progress with her obesity treatment plan along with follow-up of her obesity related diagnoses. Heather [name] is on the Category 2 Plan and states she is following her eating plan approximately 33% of the time. Heather [name] states she is doing water aerobics for 45 minutes 2 times per week.  Today's visit was #: 87 Starting weight: 268 lbs Starting date: 12/23/2016 Today's weight: 210 lbs Today's date: 10/28/2022 Total lbs lost to date: 58 Total lbs lost since last in-office visit: 0  Interim History: Patient did send celebration eating over her birthday.  She notes her hunger has increased and carbohydrate cravings have also increased.  She continues to be mindful of her eating, which has helped her avoid gaining weight.  Subjective:   1. Vitamin D deficiency Patient is on vitamin D and she is overdue for labs.  2. Gastroesophageal reflux disease without esophagitis Patient notes her heartburn has been controlled, and she has worked on decreasing portions and she is on [employer].  3. Hypokalemia Patient's K+ was low approximately 6 months ago.  She is on [employer].  4. Type 2 diabetes mellitus without complication, without long-term current use of insulin (HCC) Patient is due to have labs done.  She is working on her diet and decreasing simple carbohydrates.  Assessment/Plan:   1. Vitamin D deficiency We will check labs today, and we will refill prescription vitamin D once weekly for 90 days.  Patient is to stop prescription vitamin D if her level is above 90.  - Vitamin D, Ergocalciferol, (DRISDOL) 1.25 MG (50000 UNIT) CAPS capsule; Take 1 capsule (50,000 Units total) by mouth every 7 (seven) days.  Dispense: 13 capsule; Refill: 0 - VITAMIN D 25 Hydroxy (Vit-D Deficiency, Fractures)  2. Gastroesophageal reflux disease without esophagitis Patient will continue to work on her weight loss to help control her GERD.  3. Hypokalemia We  will check labs today, and we will follow-up at patient's next visit.  - CMP14+EGFR  4. Type 2 diabetes mellitus without complication, without long-term current use of insulin (HCC) We will check labs today.  Patient will continue with her diet and weight loss.  We will follow-up at her next visit.  - Hemoglobin A1c  5. BMI 36.0-36.9,adult  6. Obesity, Beginning BMI 44.60 Heather [name] is currently in the action stage of change. As such, her goal is to continue with weight loss efforts. She has agreed to the Category 2 Plan.   Exercise goals: As is.   Behavioral modification strategies: increasing lean protein intake and meal planning and cooking strategies.  Heather [name] has agreed to follow-up with our clinic in 4 weeks. She was informed of the importance of frequent follow-up visits to maximize her success with intensive lifestyle modifications for her multiple health conditions.   Martine was informed we would discuss her lab results at her next visit unless there is a critical issue that needs to be addressed sooner. Kimiyo agreed to keep her next visit at the agreed upon time to discuss these results.  Objective:   Blood pressure 136/75, pulse (!) 59, temperature 98 F (36.7 C), height 5\' 4"  (1.626 m), weight 210 lb (95.3 kg), SpO2 96%. Body mass index is 36.05 kg/m.  Lab Results  Component Value Date   CREATININE 0.83 10/28/2022   BUN 17 10/28/2022   NA 144 10/28/2022   K 4.1 10/28/2022   CL 105 10/28/2022   CO2 24 10/28/2022   Lab  Results  Component Value Date   ALT 24 10/28/2022   AST 29 10/28/2022   ALKPHOS 79 10/28/2022   BILITOT 0.2 10/28/2022   Lab Results  Component Value Date   HGBA1C 5.9 (H) 10/28/2022   HGBA1C 6.2 05/26/2022   HGBA1C 6.1 11/21/2021   HGBA1C 5.3 04/14/2021   HGBA1C 5.8 (H) 03/07/2021   Lab Results  Component Value Date   INSULIN 6.0 11/25/2020   INSULIN 4.3 10/09/2019   INSULIN 8.6 07/05/2019   INSULIN 6.4 12/05/2018   INSULIN 3.6 04/27/2018    Lab Results  Component Value Date   TSH 0.62 05/26/2022   Lab Results  Component Value Date   CHOL 145 05/26/2022   HDL 62.50 05/26/2022   LDLCALC 60 05/26/2022   TRIG 111.0 05/26/2022   CHOLHDL 2 05/26/2022   Lab Results  Component Value Date   VD25OH 89.5 10/28/2022   VD25OH 48.25 11/21/2021   VD25OH 55.3 04/14/2021   Lab Results  Component Value Date   WBC 4.8 05/26/2022   HGB 12.0 05/26/2022   HCT 36.5 05/26/2022   MCV 95.2 05/26/2022   PLT 246.0 05/26/2022   Lab Results  Component Value Date   IRON 49 05/26/2022   TIBC 211.4 (L) 05/26/2022   FERRITIN 288.0 05/26/2022   Attestation Statements:   Reviewed by clinician on day of visit: allergies, medications, problem list, medical history, surgical history, family history, social history, and previous encounter notes.   I, Burt Knack, am acting as transcriptionist for Quillian Quince, MD.  I have reviewed the above documentation for accuracy and completeness, and I agree with the above. -  Quillian Quince, MD

## 2022-11-03 ENCOUNTER — Other Ambulatory Visit (HOSPITAL_COMMUNITY): Payer: Self-pay | Admitting: *Deleted

## 2022-11-03 MED ORDER — CLOPIDOGREL BISULFATE 75 MG PO TABS: 75.0000 mg | ORAL_TABLET | Freq: Every day | ORAL | 3 refills | Status: DC

## 2022-11-23 ENCOUNTER — Encounter: Payer: Self-pay | Admitting: Internal Medicine

## 2022-11-23 ENCOUNTER — Other Ambulatory Visit (HOSPITAL_COMMUNITY): Payer: Self-pay | Admitting: Cardiology

## 2022-11-24 NOTE — Patient Instructions (Addendum)
    Flu immunization administered today.     Blood work was ordered.   The lab is on the first floor.    Medications changes include :   none     Return in about 6 months (around 05/25/2023) for follow up.

## 2022-11-24 NOTE — Progress Notes (Unsigned)
Subjective:    Patient ID: Heather [name], female    DOB: 1945/12/22, 77 y.o.   MRN: [id number]     HPI Cellie is here for follow up of her chronic medical problems.   Using elliptical regularly.  Continues to work on weight loss  Overall doing well-no big concerns   Medications and allergies reviewed with patient and updated if appropriate.  Current Outpatient Medications on File Prior to Visit  Medication Sig Dispense Refill   acetaminophen (TYLENOL) 500 MG tablet Take 1,000 mg by mouth 3 (three) times daily as needed for moderate pain.     Boswellia-Glucosamine-Vit D (OSTEO BI-FLEX ONE PER DAY PO) Take 1 tablet by mouth daily.     Calcium Carb-Cholecalciferol (CHEWABLE CALCIUM/D3 PO) Take 1 tablet by mouth daily.     cetirizine (ZYRTEC) 10 MG tablet Take 10 mg by mouth daily.     clopidogrel (PLAVIX) 75 MG tablet Take 1 tablet (75 mg total) by mouth daily. 90 tablet 3   Coenzyme Q10 (CO Q 10 PO) Take 400 mg by mouth.     Cyanocobalamin 1000 MCG CAPS Take 1 capsule by mouth daily.     ezetimibe (ZETIA) 10 MG tablet Take 1 tablet (10 mg total) by mouth daily. NEEDS FOLLOW UP APPOINTMENT FOR MORE REFILLS 90 tablet 0   Ferrous Sulfate (IRON) 325 (65 FE) MG TABS Take 1 tablet by mouth daily.     furosemide (LASIX) 20 MG tablet Take 1 tablet (20 mg total) by mouth 2 (two) times daily. 180 tablet 1   gabapentin (NEURONTIN) 100 MG capsule TAKE 3 CAPSULES BY MOUTH AT BEDTIME 270 capsule 3   Multiple Vitamin (MULTIVITAMIN) tablet Take 1 tablet by mouth daily.     Omega-3 Fatty Acids (FISH OIL PO) Take 2 g by mouth daily.     pantoprazole (PROTONIX) 20 MG tablet Take 1 tablet (20 mg total) by mouth 2 (two) times daily. 30 min before food 180 tablet 3   polyethylene glycol (MIRALAX / GLYCOLAX) 17 g packet Take 17 g by mouth every other day.     potassium chloride SA (KLOR-CON M20) 20 MEQ tablet Take 1 tablet (20 mEq total) by mouth daily. 90 tablet 3   pyridoxine (B-6) 200 MG  tablet Take 200 mg by mouth daily.     rosuvastatin (CRESTOR) 40 MG tablet Take 1 tablet (40 mg total) by mouth daily. At night 90 tablet 3   solifenacin (VESICARE) 10 MG tablet Take 1 tablet (10 mg total) by mouth daily. 30 tablet 11   topiramate (TOPAMAX) 25 MG tablet Take 75 mg by mouth at bedtime.     traMADol (ULTRAM) 50 MG tablet Take 1 tablet (50 mg total) by mouth 2 (two) times daily as needed. 30 tablet 2   TURMERIC PO Take 538 mg by mouth daily.     Vitamin D, Ergocalciferol, (DRISDOL) 1.25 MG (50000 UNIT) CAPS capsule Take 1 capsule (50,000 Units total) by mouth every 7 (seven) days. 13 capsule 0   vitamin E 200 UNIT capsule Take 200 Units by mouth daily.     No current facility-administered medications on file prior to visit.     Review of Systems  Constitutional:  Negative for fever.  Respiratory:  Negative for cough, shortness of breath and wheezing.   Cardiovascular:  Positive for leg swelling. Negative for chest pain and palpitations.  Gastrointestinal:  Negative for abdominal pain, constipation and diarrhea.  No gerd. BM 3-4 times a day - firm stool  Musculoskeletal:  Positive for neck pain (occ left sided neck pain - resolves with aleve).  Neurological:  Negative for light-headedness and headaches.       Objective:   Vitals:   11/25/22 1314  BP: 130/64  Pulse: 82  Temp: 98 F (36.7 C)  SpO2: 97%   BP Readings from Last 3 Encounters:  11/25/22 130/64  10/28/22 136/75  10/12/22 112/74   Wt Readings from Last 3 Encounters:  11/25/22 209 lb (94.8 kg)  10/28/22 210 lb (95.3 kg)  10/12/22 210 lb (95.3 kg)   Body mass index is 35.87 kg/m.    Physical Exam Constitutional:      General: She is not in acute distress.    Appearance: Normal appearance.  HENT:     Head: Normocephalic and atraumatic.  Eyes:     Conjunctiva/sclera: Conjunctivae normal.  Cardiovascular:     Rate and Rhythm: Normal rate and regular rhythm.     Heart sounds: Normal heart  sounds.  Pulmonary:     Effort: Pulmonary effort is normal. No respiratory distress.     Breath sounds: Normal breath sounds. No wheezing.  Musculoskeletal:     Cervical back: Neck supple.     Right lower leg: No edema.     Left lower leg: No edema.  Lymphadenopathy:     Cervical: No cervical adenopathy.  Skin:    General: Skin is warm and dry.     Findings: No rash.  Neurological:     Mental Status: She is alert. Mental status is at baseline.  Psychiatric:        Mood and Affect: Mood normal.        Behavior: Behavior normal.        Lab Results  Component Value Date   WBC 4.8 05/26/2022   HGB 12.0 05/26/2022   HCT 36.5 05/26/2022   PLT 246.0 05/26/2022   GLUCOSE 81 10/28/2022   CHOL 145 05/26/2022   TRIG 111.0 05/26/2022   HDL 62.50 05/26/2022   LDLCALC 60 05/26/2022   ALT 24 10/28/2022   AST 29 10/28/2022   NA 144 10/28/2022   K 4.1 10/28/2022   CL 105 10/28/2022   CREATININE 0.83 10/28/2022   BUN 17 10/28/2022   CO2 24 10/28/2022   TSH 0.62 05/26/2022   INR 1.1 04/26/2018   HGBA1C 5.9 (H) 10/28/2022   MICROALBUR <0.7 11/21/2021     Assessment & Plan:    See Problem List for Assessment and Plan of chronic medical problems.

## 2022-11-25 ENCOUNTER — Encounter: Payer: Self-pay | Admitting: Internal Medicine

## 2022-11-25 ENCOUNTER — Ambulatory Visit (INDEPENDENT_AMBULATORY_CARE_PROVIDER_SITE_OTHER): Payer: Medicare Other | Admitting: Internal Medicine

## 2022-11-25 VITALS — BP 130/64 | HR 82 | Temp 98.0°F | Ht 64.0 in | Wt 209.0 lb

## 2022-11-25 DIAGNOSIS — K219 Gastro-esophageal reflux disease without esophagitis: Secondary | ICD-10-CM

## 2022-11-25 DIAGNOSIS — Z23 Encounter for immunization: Secondary | ICD-10-CM

## 2022-11-25 DIAGNOSIS — I2510 Atherosclerotic heart disease of native coronary artery without angina pectoris: Secondary | ICD-10-CM | POA: Diagnosis not present

## 2022-11-25 DIAGNOSIS — E785 Hyperlipidemia, unspecified: Secondary | ICD-10-CM

## 2022-11-25 DIAGNOSIS — G6281 Critical illness polyneuropathy: Secondary | ICD-10-CM | POA: Diagnosis not present

## 2022-11-25 DIAGNOSIS — E1169 Type 2 diabetes mellitus with other specified complication: Secondary | ICD-10-CM | POA: Diagnosis not present

## 2022-11-25 DIAGNOSIS — R600 Localized edema: Secondary | ICD-10-CM

## 2022-11-25 DIAGNOSIS — R32 Unspecified urinary incontinence: Secondary | ICD-10-CM | POA: Diagnosis not present

## 2022-11-25 DIAGNOSIS — D509 Iron deficiency anemia, unspecified: Secondary | ICD-10-CM

## 2022-11-25 DIAGNOSIS — Z9861 Coronary angioplasty status: Secondary | ICD-10-CM

## 2022-11-25 NOTE — Assessment & Plan Note (Signed)
Chronic GERD controlled Continue pantoprazole 20 mg bid

## 2022-11-25 NOTE — Assessment & Plan Note (Signed)
Chronic Taking iron daily Iron panel, cbc

## 2022-11-25 NOTE — Assessment & Plan Note (Signed)
Chronic Lab Results  Component Value Date   HGBA1C 5.9 (H) 10/28/2022   sugars well controlled check a1c, urine microalbumin continue diet control stressed importance of regular exercise and diabetic diet

## 2022-11-25 NOTE — Assessment & Plan Note (Addendum)
Chronic Controlled - improved Continue furosemide 20 mg daily most daily - twice daily prn

## 2022-11-25 NOTE — Assessment & Plan Note (Signed)
Chronic No current symptoms suggestive of angina Following with Dr. Ronelle Nigh On Plavix 75 mg daily, Zetia 10 mg daily and Crestor 40 mg daily Continue above medications

## 2022-11-25 NOTE — Assessment & Plan Note (Signed)
Chronic Neuropathy controlled Continue gabapentin 300 mg nightly

## 2022-11-25 NOTE — Assessment & Plan Note (Signed)
Chronic Regular exercise and healthy diet encouraged Check lipid panel, CMP Continue Zetia 10 mg daily, Crestor 40 mg daily

## 2022-11-25 NOTE — Assessment & Plan Note (Addendum)
Chronic On vesicare 10 mg daily-it is working well -better than others Continue above

## 2022-12-01 ENCOUNTER — Ambulatory Visit (INDEPENDENT_AMBULATORY_CARE_PROVIDER_SITE_OTHER): Payer: Medicare Other | Admitting: Family Medicine

## 2022-12-01 ENCOUNTER — Encounter (INDEPENDENT_AMBULATORY_CARE_PROVIDER_SITE_OTHER): Payer: Self-pay | Admitting: Family Medicine

## 2022-12-01 VITALS — BP 117/70 | HR 65 | Temp 98.1°F | Ht 64.0 in | Wt 209.0 lb

## 2022-12-01 DIAGNOSIS — Z6836 Body mass index (BMI) 36.0-36.9, adult: Secondary | ICD-10-CM

## 2022-12-01 DIAGNOSIS — F439 Reaction to severe stress, unspecified: Secondary | ICD-10-CM

## 2022-12-01 DIAGNOSIS — E669 Obesity, unspecified: Secondary | ICD-10-CM

## 2022-12-01 NOTE — Progress Notes (Signed)
Chief Complaint:   OBESITY Heather [name] is here to discuss her progress with her obesity treatment plan along with follow-up of her obesity related diagnoses. Heather [name] is on the Category 2 Plan and states she is following her eating plan approximately 50% of the time. Heather [name] states she is on the elliptical for 45 minutes 2 times per week.  Today's visit was #: 88 Starting weight: 268 lbs Starting date: 12/23/2016 Today's weight: 209 lbs Today's date: 12/01/2022 Total lbs lost to date: 59 Total lbs lost since last in-office visit: 1  Interim History: Patient has done well with her weight loss in the last month.  She had more celebration eating situations and some family sabotaging temptations.  Subjective:   1. Stress Patient has had some family stressors, which have made eating healthy difficult.  Assessment/Plan:   1. Stress Patient was offered support and encouragement to help her deal with some of her stress eating behaviors.  2. BMI 36.0-36.9,adult  3. Obesity, Beginning BMI 44.60 Heather [name] is currently in the action stage of change. As such, her goal is to continue with weight loss efforts. She has agreed to the Category 2 Plan.   Exercise goals: As is.   Behavioral modification strategies: meal planning and cooking strategies and dealing with family or coworker sabotage.  Heather [name] has agreed to follow-up with our clinic in 4 weeks. She was informed of the importance of frequent follow-up visits to maximize her success with intensive lifestyle modifications for her multiple health conditions.   Objective:   Blood pressure 117/70, pulse 65, temperature 98.1 F (36.7 C), height 5\' 4"  (1.626 m), weight 209 lb (94.8 kg), SpO2 (!) 63%. Body mass index is 35.87 kg/m.  Lab Results  Component Value Date   CREATININE 0.83 10/28/2022   BUN 17 10/28/2022   NA 144 10/28/2022   K 4.1 10/28/2022   CL 105 10/28/2022   CO2 24 10/28/2022   Lab Results  Component Value Date   ALT 24  10/28/2022   AST 29 10/28/2022   ALKPHOS 79 10/28/2022   BILITOT 0.2 10/28/2022   Lab Results  Component Value Date   HGBA1C 5.9 (H) 10/28/2022   HGBA1C 6.2 05/26/2022   HGBA1C 6.1 11/21/2021   HGBA1C 5.3 04/14/2021   HGBA1C 5.8 (H) 03/07/2021   Lab Results  Component Value Date   INSULIN 6.0 11/25/2020   INSULIN 4.3 10/09/2019   INSULIN 8.6 07/05/2019   INSULIN 6.4 12/05/2018   INSULIN 3.6 04/27/2018   Lab Results  Component Value Date   TSH 0.62 05/26/2022   Lab Results  Component Value Date   CHOL 145 05/26/2022   HDL 62.50 05/26/2022   LDLCALC 60 05/26/2022   TRIG 111.0 05/26/2022   CHOLHDL 2 05/26/2022   Lab Results  Component Value Date   VD25OH 89.5 10/28/2022   VD25OH 48.25 11/21/2021   VD25OH 55.3 04/14/2021   Lab Results  Component Value Date   WBC 4.8 05/26/2022   HGB 12.0 05/26/2022   HCT 36.5 05/26/2022   MCV 95.2 05/26/2022   PLT 246.0 05/26/2022   Lab Results  Component Value Date   IRON 49 05/26/2022   TIBC 211.4 (L) 05/26/2022   FERRITIN 288.0 05/26/2022   Attestation Statements:   Reviewed by clinician on day of visit: allergies, medications, problem list, medical history, surgical history, family history, social history, and previous encounter notes.  Time spent on visit including pre-visit chart review and post-visit care and charting was 30 minutes.  I, Burt Knack, am acting as transcriptionist for Quillian Quince, MD.  I have reviewed the above documentation for accuracy and completeness, and I agree with the above. -  Quillian Quince, MD

## 2022-12-09 DIAGNOSIS — Z1231 Encounter for screening mammogram for malignant neoplasm of breast: Secondary | ICD-10-CM | POA: Diagnosis not present

## 2022-12-14 ENCOUNTER — Ambulatory Visit (INDEPENDENT_AMBULATORY_CARE_PROVIDER_SITE_OTHER): Payer: Medicare Other | Admitting: Neurology

## 2022-12-14 ENCOUNTER — Encounter: Payer: Self-pay | Admitting: Neurology

## 2022-12-14 VITALS — BP 123/72 | HR 59 | Ht 64.0 in | Wt 212.0 lb

## 2022-12-14 DIAGNOSIS — G3184 Mild cognitive impairment, so stated: Secondary | ICD-10-CM | POA: Insufficient documentation

## 2022-12-14 DIAGNOSIS — G4733 Obstructive sleep apnea (adult) (pediatric): Secondary | ICD-10-CM

## 2022-12-14 DIAGNOSIS — G43009 Migraine without aura, not intractable, without status migrainosus: Secondary | ICD-10-CM | POA: Diagnosis not present

## 2022-12-14 MED ORDER — TOPIRAMATE 25 MG PO TABS: 75.0000 mg | ORAL_TABLET | Freq: Every day | ORAL | 3 refills | Status: DC

## 2022-12-14 NOTE — Patient Instructions (Signed)
Great to see you today.  Continue nightly BiPAP use, no changes to settings.  Follow-up in 1 year.  Thanks

## 2022-12-14 NOTE — Progress Notes (Signed)
PATIENT: Heather [name] DOB: Apr 02, 1945  REASON FOR VISIT: follow up HISTORY FROM: patient Primary Neurologist: Willis/Dohmeier   HISTORY OF PRESENT ILLNESS: Today 12/14/22 Had SBO in Feb, has recovered well. MMSE 30/30 today. Thinks short term memory is age related, takes her longer to recall but she can. Remains on Topamax 75 mg at bedtime, no headaches. Wishes to remain on.  BiPAP usage is superb at 100%, AHI 0.3, leak 46.4, IPAP 12 EPAP 8 cm water.  Remains very active, lives with her husband, continues to be involved with [employer].  Update 12/16/21 SS: Heather [name] is here today for follow-up.  MMSE 30/30. She focused for the test. Short term memory is not extraordinary anymore. Some trouble remembering names, but remembers other complicated details about them. Lives with husband. Does her own daily activities. Is very active, independent. She doesn't think memory issue, more of overload problem.   Headaches are essentially resolved. 2 headaches in 1 year. Still on Topamax 75 mg, tried to reduce to 25 mg had more headaches. No change in memory with lower dose.   Review of CPAP data indicates overall excellent compliance at 97% 29/30 days.  Average usage days used 6 hours 41 minutes. IPAP 12, EPAP 8.  Leak in the 95th percentile was 44.5, AHI 0.4. sleeps like a rock. Sometimes mask leaks with whistle, will tighten. 1 night she was out of town. Uses full face mask. ESS 16, but claims score may be skewed because she relaxes very well.   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 Dealing with right rotator cuff issues.   Update 08/12/20 SS: Floriana Corbridge is a 77 year old female with history of memory issues.  Is on CPAP.  Has been on Topamax for migraines, was able to reduce the Topamax from 75 mg to 25 mg daily.  MRI of the brain is unremarkable. MMSE 30/30 today. No change in memory since Topamax dose reduction. No longer reports severe migraines, only  mild headaches, doing well with this. Sometimes will have episodes of wooziness, but if she takes an sip of something to drink resolves. These are not intense. Most issue is word finding, is picky with finding the right word. Hard to identify actors on TV. She drives a car, is still highly functional. Bothers her can't do what she once did, remembering miscellaneous useless facts. In PT, walked in without cane today. Having cataract surgery soon. Has had recent Lumbar ESI.  HISTORY  02/08/2020 Dr. Anne Hahn: Heather [name] is a 77 year old right-handed black female with a history of sleep apnea and diabetes.  The patient is on CPAP.  She reports a 5 to 6-year history of some memory issues that have gradually changed over time.  She mainly reports difficulty with word finding, and difficulty remembering names for people.  She has a history of migraine headaches, she has been on Topamax for a number of years but seems to tolerate this fairly well.  She currently is on 100 mg daily of Topamax.  She reports no problems with driving.  She is able to keep up with her medications and appointments fairly well, but occasionally she will get mixed up on dosing of medications.  The patient reports that she sleeps well at night and has a good energy level during the day.  She is doing quite well with her migraines, she only has an occasional headache.  She reports that her mother also had dementia but she  had bipolar disorder as well.  The patient is sent to this office for further evaluation.  REVIEW OF SYSTEMS: Out of a complete 14 system review of symptoms, the patient complains only of the following symptoms, and all other reviewed systems are negative.  See HPI  ALLERGIES: Allergies  Allergen Reactions   Aspirin Hives   Coconut (Cocos Nucifera) Hives   Lisinopril Cough   Metoprolol Itching   Other     Latex paint, the smell causes hives. Not allergic to latex gloves   General anesthesia - has had issues in  past where anesthesia was not properly administered and caused side effects   Peanut-Containing Drug Products Hives, Itching and Other (See Comments)   Prednisone     Unknown reaction    Strawberry Extract Hives   Penicillins Rash    HOME MEDICATIONS: Outpatient Medications Prior to Visit  Medication Sig Dispense Refill   acetaminophen (TYLENOL) 500 MG tablet Take 1,000 mg by mouth 3 (three) times daily as needed for moderate pain.     Boswellia-Glucosamine-Vit D (OSTEO BI-FLEX ONE PER DAY PO) Take 1 tablet by mouth daily.     Calcium Carb-Cholecalciferol (CHEWABLE CALCIUM/D3 PO) Take 1 tablet by mouth daily.     cetirizine (ZYRTEC) 10 MG tablet Take 10 mg by mouth daily.     clopidogrel (PLAVIX) 75 MG tablet Take 1 tablet (75 mg total) by mouth daily. 90 tablet 3   Coenzyme Q10 (CO Q 10 PO) Take 400 mg by mouth.     Cyanocobalamin 1000 MCG CAPS Take 1 capsule by mouth daily.     ezetimibe (ZETIA) 10 MG tablet Take 1 tablet (10 mg total) by mouth daily. NEEDS FOLLOW UP APPOINTMENT FOR MORE REFILLS 90 tablet 0   Ferrous Sulfate (IRON) 325 (65 FE) MG TABS Take 1 tablet by mouth daily.     furosemide (LASIX) 20 MG tablet Take 1 tablet (20 mg total) by mouth 2 (two) times daily. 180 tablet 1   gabapentin (NEURONTIN) 100 MG capsule TAKE 3 CAPSULES BY MOUTH AT BEDTIME 270 capsule 3   Multiple Vitamin (MULTIVITAMIN) tablet Take 1 tablet by mouth daily.     naproxen sodium (ALEVE) 220 MG tablet Take 220 mg by mouth as needed.     Omega-3 Fatty Acids (FISH OIL PO) Take 2 g by mouth daily.     pantoprazole (PROTONIX) 20 MG tablet Take 1 tablet (20 mg total) by mouth 2 (two) times daily. 30 min before food 180 tablet 3   polyethylene glycol (MIRALAX / GLYCOLAX) 17 g packet Take 17 g by mouth every other day.     potassium chloride SA (KLOR-CON M20) 20 MEQ tablet Take 1 tablet (20 mEq total) by mouth daily. 90 tablet 3   pyridoxine (B-6) 200 MG tablet Take 200 mg by mouth daily.     rosuvastatin  (CRESTOR) 40 MG tablet Take 1 tablet (40 mg total) by mouth daily. At night 90 tablet 3   solifenacin (VESICARE) 10 MG tablet Take 1 tablet (10 mg total) by mouth daily. 30 tablet 11   topiramate (TOPAMAX) 25 MG tablet Take 75 mg by mouth at bedtime.     traMADol (ULTRAM) 50 MG tablet Take 1 tablet (50 mg total) by mouth 2 (two) times daily as needed. 30 tablet 2   TURMERIC PO Take 538 mg by mouth daily.     Vitamin D, Ergocalciferol, (DRISDOL) 1.25 MG (50000 UNIT) CAPS capsule Take 1 capsule (50,000 Units total) by mouth  every 7 (seven) days. 13 capsule 0   vitamin E 200 UNIT capsule Take 200 Units by mouth daily.     No facility-administered medications prior to visit.    PAST MEDICAL HISTORY: Past Medical History:  Diagnosis Date   Abdominal hernia    Abnormal Pap smear    ALLERGIC RHINITIS 10/22/2006   Allergy    ANEMIA 12/18/2008   Arthritis    scoliosis moderate deg changes lumbar Xray 11/04/17    Asthma    ASYMPTOMATIC POSTMENOPAUSAL STATUS 11/22/2007   Back pain    CAD (coronary artery disease)    in LAD   CHF (congestive heart failure) (HCC)    Complication of anesthesia 2010   pt states she woke up during colonoscopy and was aware of "losing control of breathing" during mammoplasty   Degenerative arthritis    Diverticulosis    Dyslipidemia    Fibroid    Frequent headaches    GERD 07/20/2007   GOITER, MULTINODULAR 07/20/2007   Headache(784.0) 07/20/2007   HEARING LOSS 11/22/2007   Hiatal hernia    Hiatal hernia    large   History of chicken pox    Hx of colposcopy with cervical biopsy    HYPERCHOLESTEROLEMIA 01/13/2007   Hyperglycemia    HYPERTENSION 10/22/2006   Lung nodule    unchanged since 05/26/13    Migraines    NASH (nonalcoholic steatohepatitis)    Nocturnal hypoxemia 02/17/2013   Obesity    Obstructive sleep apnea    OSA on CPAP    per neurology   OSTEOARTHRITIS 10/22/2006   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   Pre-diabetes     Sedimentation rate elevation    Sleep apnea    on cpap   Urine incontinence    UTI (urinary tract infection)     PAST SURGICAL HISTORY: Past Surgical History:  Procedure Laterality Date   BREAST SURGERY  1984   Breast reduction b/l    CATARACT EXTRACTION, BILATERAL     DEXA  08/2005   DILATION AND CURETTAGE OF UTERUS     ELECTROCARDIOGRAM  10/15/2006   ESOPHAGOGASTRODUODENOSCOPY  12/08/2005   FOOT SURGERY     hammertoe and bunion 05/2017    Stress Cardiolite  10/21/2005   sweat gland removal     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   Procedure: Left TOTAL KNEE ARTHROPLASTY;  Surgeon: Kathryne Hitch, MD;  Location: [facility] OR;  Service: Orthopedics;  Laterality: Left;   UMBILICAL HERNIA REPAIR N/A 04/25/2022   Procedure: OPEN INCARCERATED HERNIA REPAIR UMBILICAL WITH APPENDECTOMY;  Surgeon: Quentin Ore, MD;  Location: [facility] OR;  Service: General;  Laterality: N/A;   WISDOM TOOTH EXTRACTION      FAMILY HISTORY: Family History  Problem Relation Age of Onset   Asthma Mother    Depression Mother    Bipolar disorder Mother    Dementia Mother    Arthritis Mother    Breast cancer Mother        primary   Colon cancer Mother        mets from breast   Cancer Mother        colon   Hypertension Father    Migraines Father    Cancer Maternal Aunt        ?   Heart disease Maternal Grandmother    Cancer Maternal Grandfather        stomach ?   Sleep apnea Neg Hx    Esophageal cancer Neg Hx  Stomach cancer Neg Hx    Rectal cancer Neg Hx     SOCIAL HISTORY: Social History   Socioeconomic History   Marital status: Married    Spouse name: Darcel Bayley   Number of children: 2   Years of education: College   Highest education level: Bachelor's degree (e.g., BA, AB, BS)  Occupational History   Occupation: Retired  Tobacco Use   Smoking status: Former    Current packs/day: 0.00    Average packs/day: 2.0 packs/day for 20.0 years (40.0 ttl pk-yrs)    Types: Cigarettes     Start date: 03/03/1959    Quit date: 03/03/1979    Years since quitting: 43.8   Smokeless tobacco: Never  Vaping Use   Vaping status: Never Used  Substance and Sexual Activity   Alcohol use: Yes    Comment: rare - TWICE A YR   Drug use: No   Sexual activity: Yes    Birth control/protection: Surgical  Other Topics Concern   Not on file  Social History Narrative   Patient is married Darcel Bayley).   Right Handed   Drinks 1 cup caffeine   Social Determinants of Health   Financial Resource Strain: Low Risk  (11/23/2022)   Overall Financial Resource Strain (CARDIA)    Difficulty of Paying Living Expenses: Not hard at all  Food Insecurity: No Food Insecurity (11/23/2022)   Hunger Vital Sign    Worried About Running Out of Food in the Last Year: Never true    Ran Out of Food in the Last Year: Never true  Transportation Needs: No Transportation Needs (11/23/2022)   PRAPARE - Administrator, Civil Service (Medical): No    Lack of Transportation (Non-Medical): No  Physical Activity: Unknown (11/23/2022)   Exercise Vital Sign    Days of Exercise per Week: Patient declined    Minutes of Exercise per Session: 0 min  Stress: No Stress Concern Present (11/23/2022)   [employer] of Occupational Health - Occupational Stress Questionnaire    Feeling of Stress : Not at all  Social Connections: Socially Integrated (11/23/2022)   Social Connection and Isolation Panel [NHANES]    Frequency of Communication with Friends and Family: Three times a week    Frequency of Social Gatherings with Friends and Family: Twice a week    Attends Religious Services: More than 4 times per year    Active Member of [employer] or Organizations: Yes    Attends Engineer, structural: More than 4 times per year    Marital Status: Married  Catering manager Violence: Not At Risk (05/26/2022)   Humiliation, Afraid, Rape, and Kick questionnaire    Fear of Current or Ex-Partner: No    Emotionally Abused: No     Physically Abused: No    Sexually Abused: No   PHYSICAL EXAM  Vitals:   12/14/22 1111  BP: 123/72  Pulse: (!) 59  Weight: 212 lb (96.2 kg)  Height: 5\' 4"  (1.626 m)    Body mass index is 36.39 kg/m.  Generalized: Well developed, in no acute distress     12/14/2022   11:13 AM 12/16/2021    1:46 PM 01/14/2021   11:37 AM  MMSE - Mini Mental State Exam  Orientation to time 5 5 4   Orientation to Place 5 5 5   Registration 3 3 3   Attention/ Calculation 5 5 4   Recall 3 3 3   Language- name 2 objects 2 2 2   Language- repeat 1 1 1  Language- follow 3 step command 3 3 3   Language- read & follow direction 1 1 1   Write a sentence 1 1 1   Copy design 1 1 1   Total score 30 30 28    Neurological examination  Mentation: Alert oriented to time, place, history taking. Follows all commands speech and language fluent.   Cranial nerve II-XII: Pupils were equal round reactive to light. Extraocular movements were full, visual field were full on confrontational test. Facial sensation and strength were normal. Head turning and shoulder shrug  were normal and symmetric. Motor: Good strength all extremities Sensory: Sensory testing is intact to soft touch on all 4 extremities. No evidence of extinction is noted.  Coordination: Cerebellar testing reveals good finger-nose-finger and heel-to-shin bilaterally.  Gait and station: Gait is wide-based, cautious but independent Reflexes: Deep tendon reflexes are symmetric but depressed throughout  DIAGNOSTIC DATA (LABS, IMAGING, TESTING) - I reviewed patient records, labs, notes, testing and imaging myself where available.  Lab Results  Component Value Date   WBC 4.8 05/26/2022   HGB 12.0 05/26/2022   HCT 36.5 05/26/2022   MCV 95.2 05/26/2022   PLT 246.0 05/26/2022      Component Value Date/Time   NA 144 10/28/2022 1159   K 4.1 10/28/2022 1159   CL 105 10/28/2022 1159   CO2 24 10/28/2022 1159   GLUCOSE 81 10/28/2022 1159   GLUCOSE 87  05/26/2022 1552   BUN 17 10/28/2022 1159   CREATININE 0.83 10/28/2022 1159   CREATININE 0.80 12/07/2019 1246   CREATININE 0.81 12/28/2011 0853   CALCIUM 8.9 10/28/2022 1159   PROT 7.0 10/28/2022 1159   ALBUMIN 4.0 10/28/2022 1159   AST 29 10/28/2022 1159   AST 19 12/07/2019 1246   ALT 24 10/28/2022 1159   ALT 20 12/07/2019 1246   ALKPHOS 79 10/28/2022 1159   BILITOT 0.2 10/28/2022 1159   BILITOT 0.3 12/07/2019 1246   GFRNONAA >60 04/30/2022 0222   GFRNONAA >60 12/07/2019 1246   GFRAA 100 10/09/2019 1425   Lab Results  Component Value Date   CHOL 145 05/26/2022   HDL 62.50 05/26/2022   LDLCALC 60 05/26/2022   TRIG 111.0 05/26/2022   CHOLHDL 2 05/26/2022   Lab Results  Component Value Date   HGBA1C 5.9 (H) 10/28/2022   Lab Results  Component Value Date   VITAMINB12 3,179 (H) 12/07/2019   Lab Results  Component Value Date   TSH 0.62 05/26/2022   ASSESSMENT AND PLAN 77 y.o. year old female  has a past medical history of Abdominal hernia, Abnormal Pap smear, ALLERGIC RHINITIS (10/22/2006), Allergy, ANEMIA (12/18/2008), Arthritis, Asthma, ASYMPTOMATIC POSTMENOPAUSAL STATUS (11/22/2007), Back pain, CAD (coronary artery disease), CHF (congestive heart failure) (HCC), Complication of anesthesia (2010), Degenerative arthritis, Diverticulosis, Dyslipidemia, Fibroid, Frequent headaches, GERD (07/20/2007), GOITER, MULTINODULAR (07/20/2007), Headache(784.0) (07/20/2007), HEARING LOSS (11/22/2007), Hiatal hernia, Hiatal hernia, History of chicken pox, colposcopy with cervical biopsy, HYPERCHOLESTEROLEMIA (01/13/2007), Hyperglycemia, HYPERTENSION (10/22/2006), Lung nodule, Migraines, NASH (nonalcoholic steatohepatitis), Nocturnal hypoxemia (02/17/2013), Obesity, Obstructive sleep apnea, OSA on CPAP, OSTEOARTHRITIS (10/22/2006), Other chronic nonalcoholic liver disease (07/20/2007), Pre-diabetes, Sedimentation rate elevation, Sleep apnea, Urine incontinence, and UTI (urinary tract infection).  here with:  Reported memory disturbance, mild cognitive impairment -Stable, MMSE 30/30, will   Migraine headache -Under excellent control, continue Topamax 75 mg nightly, when tried to wean had increase in headache  OSA on BIPAP -Overall excellent compliance -Continue current settings, send supplies as needed.  Recommend continue nightly usage for minimum of 4 hours. -May be eligible  next year for a new machine -Follow-up in 1 year or sooner if needed  Meds ordered this encounter  Medications   topiramate (TOPAMAX) 25 MG tablet    Sig: Take 3 tablets (75 mg total) by mouth at bedtime.    Dispense:  270 tablet    Refill:  3   Margie Ege, AGNP-C, DNP 12/14/2022, 11:21 AM [facility] [address], [address] [place], [place] [address] [phone]

## 2022-12-17 ENCOUNTER — Ambulatory Visit: Payer: Medicare Other | Admitting: Neurology

## 2022-12-24 ENCOUNTER — Encounter: Payer: Self-pay | Admitting: Urology

## 2022-12-24 ENCOUNTER — Ambulatory Visit: Payer: Medicare Other | Admitting: Urology

## 2022-12-24 VITALS — BP 145/78 | HR 71 | Ht 64.0 in | Wt 212.0 lb

## 2022-12-24 DIAGNOSIS — N3941 Urge incontinence: Secondary | ICD-10-CM

## 2022-12-24 LAB — MICROSCOPIC EXAMINATION

## 2022-12-24 LAB — URINALYSIS, ROUTINE W REFLEX MICROSCOPIC
Bilirubin, UA: NEGATIVE
Glucose, UA: NEGATIVE
Ketones, UA: NEGATIVE
Nitrite, UA: NEGATIVE
Protein,UA: NEGATIVE
Specific Gravity, UA: 1.02 (ref 1.005–1.030)
Urobilinogen, Ur: 1 mg/dL (ref 0.2–1.0)
pH, UA: 7 (ref 5.0–7.5)

## 2022-12-24 MED ORDER — SOLIFENACIN SUCCINATE 10 MG PO TABS
10.0000 mg | ORAL_TABLET | Freq: Every day | ORAL | 3 refills | Status: DC
Start: 2022-12-24 — End: 2023-05-31

## 2022-12-24 NOTE — Progress Notes (Signed)
Assessment: 1. Urge incontinence     Plan: Continue bladder diet  Continue Solifenacin 10 mg daily.   Timed and double voiding to make sure bladder empties completely Return to office in 3 months  Chief Complaint:  Chief Complaint  Patient presents with   Urinary Incontinence    History of Present Illness:  Heather [name] is a 77 y.o. female who is seen for further evaluation of urge incontinence.  She has had symptoms for >1 year.  She has frequency, urgency, and urge incontinence.  No nocturia or nocturnal incontinence.  She is wearing adult diapers daily, changing at least 1 time/day.  No dysuria or gross hematuria.  She had been on trospium 20 mg BID without improvement in her symptoms.  She had not tried any other medical therapy.   No recent UTI's.   No fecal incontinence.  No significant problems with constipation. She was given a trial of Solifenacin 10 mg daily at her visit in June 2024. Urine culture grew 25-50 K of group B strep. PVR = 4 ml  She returns today for follow-up.  She continues on Solifenacin 10 mg daily.  She continues to see improvement in her incontinence.  She has decreased frequency and urgency.  Her incontinence episodes are decreased as well.  No side effects from the medication. No dysuria or gross hematuria.  Portions of the above documentation were copied from a prior visit for review purposes only.   Past Medical History:  Past Medical History:  Diagnosis Date   Abdominal hernia    Abnormal Pap smear    ALLERGIC RHINITIS 10/22/2006   Allergy    ANEMIA 12/18/2008   Arthritis    scoliosis moderate deg changes lumbar Xray 11/04/17    Asthma    ASYMPTOMATIC POSTMENOPAUSAL STATUS 11/22/2007   Back pain    CAD (coronary artery disease)    in LAD   CHF (congestive heart failure) (HCC)    Complication of anesthesia 2010   pt states she woke up during colonoscopy and was aware of "losing control of breathing" during mammoplasty    Degenerative arthritis    Diverticulosis    Dyslipidemia    Fibroid    Frequent headaches    GERD 07/20/2007   GOITER, MULTINODULAR 07/20/2007   Headache(784.0) 07/20/2007   HEARING LOSS 11/22/2007   Hiatal hernia    Hiatal hernia    large   History of chicken pox    Hx of colposcopy with cervical biopsy    HYPERCHOLESTEROLEMIA 01/13/2007   Hyperglycemia    HYPERTENSION 10/22/2006   Lung nodule    unchanged since 05/26/13    Migraines    NASH (nonalcoholic steatohepatitis)    Nocturnal hypoxemia 02/17/2013   Obesity    Obstructive sleep apnea    OSA on CPAP    per neurology   OSTEOARTHRITIS 10/22/2006   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   Pre-diabetes    Sedimentation rate elevation    Sleep apnea    on cpap   Urine incontinence    UTI (urinary tract infection)     Past Surgical History:  Past Surgical History:  Procedure Laterality Date   BREAST SURGERY  1984   Breast reduction b/l    CATARACT EXTRACTION, BILATERAL     DEXA  08/2005   DILATION AND CURETTAGE OF UTERUS     ELECTROCARDIOGRAM  10/15/2006   ESOPHAGOGASTRODUODENOSCOPY  12/08/2005   FOOT SURGERY     hammertoe and bunion 05/2017  Stress Cardiolite  10/21/2005   sweat gland removal     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   Procedure: Left TOTAL KNEE ARTHROPLASTY;  Surgeon: Kathryne Hitch, MD;  Location: [facility] OR;  Service: Orthopedics;  Laterality: Left;   UMBILICAL HERNIA REPAIR N/A 04/25/2022   Procedure: OPEN INCARCERATED HERNIA REPAIR UMBILICAL WITH APPENDECTOMY;  Surgeon: Quentin Ore, MD;  Location: [facility] OR;  Service: General;  Laterality: N/A;   WISDOM TOOTH EXTRACTION      Allergies:  Allergies  Allergen Reactions   Aspirin Hives   Coconut (Cocos Nucifera) Hives   Lisinopril Cough   Metoprolol Itching   Other     Latex paint, the smell causes hives. Not allergic to latex gloves   General anesthesia - has had issues in past where anesthesia was not  properly administered and caused side effects   Peanut-Containing Drug Products Hives, Itching and Other (See Comments)   Prednisone     Unknown reaction    Strawberry Extract Hives   Penicillins Rash    Family History:  Family History  Problem Relation Age of Onset   Asthma Mother    Depression Mother    Bipolar disorder Mother    Dementia Mother    Arthritis Mother    Breast cancer Mother        primary   Colon cancer Mother        mets from breast   Cancer Mother        colon   Hypertension Father    Migraines Father    Cancer Maternal Aunt        ?   Heart disease Maternal Grandmother    Cancer Maternal Grandfather        stomach ?   Sleep apnea Neg Hx    Esophageal cancer Neg Hx    Stomach cancer Neg Hx    Rectal cancer Neg Hx     Social History:  Social History   Tobacco Use   Smoking status: Former    Current packs/day: 0.00    Average packs/day: 2.0 packs/day for 20.0 years (40.0 ttl pk-yrs)    Types: Cigarettes    Start date: 03/03/1959    Quit date: 03/03/1979    Years since quitting: 43.8   Smokeless tobacco: Never  Vaping Use   Vaping status: Never Used  Substance Use Topics   Alcohol use: Yes    Comment: rare - TWICE A YR   Drug use: No    ROS: Constitutional:  Negative for fever, chills, weight loss CV: Negative for chest pain, previous MI, hypertension Respiratory:  Negative for shortness of breath, wheezing, sleep apnea, frequent cough GI:  Negative for nausea, vomiting, bloody stool, GERD  Physical exam: BP (!) 145/78   Pulse 71   Ht 5\' 4"  (1.626 m)   Wt 212 lb (96.2 kg)   BMI 36.39 kg/m  GENERAL APPEARANCE:  Well appearing, well developed, well nourished, NAD HEENT:  Atraumatic, normocephalic, oropharynx clear NECK:  Supple without lymphadenopathy or thyromegaly ABDOMEN:  Soft, non-tender, no masses EXTREMITIES:  Moves all extremities well, without clubbing, cyanosis, or edema NEUROLOGIC:  Alert and oriented x 3, normal gait, CN  II-XII grossly intact MENTAL STATUS:  appropriate BACK:  Non-tender to palpation, No CVAT SKIN:  Warm, dry, and intact  Results: U/A: 6-10 WBC, 3-10 RBC

## 2022-12-31 ENCOUNTER — Encounter: Payer: Self-pay | Admitting: Podiatry

## 2022-12-31 ENCOUNTER — Ambulatory Visit: Payer: Medicare Other | Admitting: Podiatry

## 2022-12-31 DIAGNOSIS — D2372 Other benign neoplasm of skin of left lower limb, including hip: Secondary | ICD-10-CM

## 2022-12-31 DIAGNOSIS — D2371 Other benign neoplasm of skin of right lower limb, including hip: Secondary | ICD-10-CM | POA: Diagnosis not present

## 2022-12-31 DIAGNOSIS — D689 Coagulation defect, unspecified: Secondary | ICD-10-CM

## 2022-12-31 DIAGNOSIS — B351 Tinea unguium: Secondary | ICD-10-CM | POA: Diagnosis not present

## 2022-12-31 DIAGNOSIS — M79676 Pain in unspecified toe(s): Secondary | ICD-10-CM | POA: Diagnosis not present

## 2022-12-31 DIAGNOSIS — M79675 Pain in left toe(s): Secondary | ICD-10-CM | POA: Diagnosis not present

## 2023-01-01 ENCOUNTER — Encounter: Payer: Self-pay | Admitting: Urology

## 2023-01-02 NOTE — Progress Notes (Signed)
She presents today chief complaint of painful elongated toenails 1 through 5 bilateral.  Also painful calluses.  Objective: Pulses are palpable no open lesions or wounds are noted.  Skin is supple and well-hydrated toenails are thick yellow dystrophic clinically mycotic with benign skin lesions plantar aspect of the forefoot.  Assessment: Pain limb secondary onychomycosis pain limb secondary to benign skin lesions.  Plan: Debridement of toenails 1 through 5 bilateral debridement benign skin lesions.

## 2023-01-12 ENCOUNTER — Encounter (INDEPENDENT_AMBULATORY_CARE_PROVIDER_SITE_OTHER): Payer: Self-pay | Admitting: Family Medicine

## 2023-01-12 ENCOUNTER — Ambulatory Visit (INDEPENDENT_AMBULATORY_CARE_PROVIDER_SITE_OTHER): Payer: Medicare Other | Admitting: Family Medicine

## 2023-01-12 VITALS — BP 137/66 | HR 59 | Temp 97.4°F | Ht 64.0 in | Wt 208.0 lb

## 2023-01-12 DIAGNOSIS — E559 Vitamin D deficiency, unspecified: Secondary | ICD-10-CM | POA: Diagnosis not present

## 2023-01-12 DIAGNOSIS — M0609 Rheumatoid arthritis without rheumatoid factor, multiple sites: Secondary | ICD-10-CM | POA: Diagnosis not present

## 2023-01-12 DIAGNOSIS — R600 Localized edema: Secondary | ICD-10-CM

## 2023-01-12 DIAGNOSIS — Z6835 Body mass index (BMI) 35.0-35.9, adult: Secondary | ICD-10-CM | POA: Diagnosis not present

## 2023-01-12 DIAGNOSIS — F4321 Adjustment disorder with depressed mood: Secondary | ICD-10-CM | POA: Insufficient documentation

## 2023-01-12 DIAGNOSIS — E669 Obesity, unspecified: Secondary | ICD-10-CM | POA: Diagnosis not present

## 2023-01-12 MED ORDER — VITAMIN D (ERGOCALCIFEROL) 1.25 MG (50000 UNIT) PO CAPS: 50000.0000 [IU] | ORAL_CAPSULE | ORAL | 0 refills | Status: DC

## 2023-01-12 NOTE — Progress Notes (Signed)
.[facility]  Office: [phone]  /  Fax: [phone]  WEIGHT SUMMARY AND BIOMETRICS  Anthropometric Measurements Height: 5\' 4"  (1.626 m) Weight: 208 lb (94.3 kg) BMI (Calculated): 35.69 Weight at Last Visit: 209 lb Weight Lost Since Last Visit: 1 lb Weight Gained Since Last Visit: 0 Starting Weight: 268 lb Total Weight Loss (lbs): 64 lb (29 kg)   Body Composition  Body Fat %: 49.7 % Fat Mass (lbs): 103.6 lbs Muscle Mass (lbs): 99.6 lbs Visceral Fat Rating : 16   Other Clinical Data Fasting: No Labs: No Today's Visit #: 76 Starting Date: 12/23/16    Chief Complaint: OBESITY   History of Present Illness   The patient, with a known history of vitamin D deficiency and obesity, presents for a follow-up visit. She reports a weight loss of one pound over the past month and adherence to her category two eating plan approximately 80% of the time. However, she admits to not currently engaging in any exercise regimen.  The patient has been experiencing significant stress due to personal and familial issues, including the recent death of a nephew and the anticipated stress of the holiday season. She also expresses concern about an upcoming family gathering for Thanksgiving, which she is hosting.  In addition to these stressors, the patient has been dealing with arthritis pain, which she describes as 'excruciating' and affecting multiple joints. She reports that the pain has been so severe that it has caused her to use a cane for mobility. However, she notes that the pain has lessened in the past week.  The patient also reports issues with fluid retention, particularly in the ankles. She is currently taking furosemide, but expresses difficulty in managing the medication schedule due to frequent urination and her active lifestyle.  Despite these challenges, the patient has been making efforts to manage her health. She has been logging her food intake and has noticed a tendency to under-eat  following periods of overeating. She expresses a desire to try a low-carb diet after the holiday season. She also mentions that using an elliptical machine has helped alleviate her arthritis pain.  The patient's overall mood appears to be resilient despite the numerous stressors and health issues she is currently facing. She expresses a positive outlook and a willingness to continue working on her health management strategies.          PHYSICAL EXAM:  Blood pressure 137/66, pulse (!) 59, temperature (!) 97.4 F (36.3 C), height 5\' 4"  (1.626 m), weight 208 lb (94.3 kg), SpO2 100%. Body mass index is 35.7 kg/m.  DIAGNOSTIC DATA REVIEWED:  BMET    Component Value Date/Time   NA 144 10/28/2022 1159   K 4.1 10/28/2022 1159   CL 105 10/28/2022 1159   CO2 24 10/28/2022 1159   GLUCOSE 81 10/28/2022 1159   GLUCOSE 87 05/26/2022 1552   BUN 17 10/28/2022 1159   CREATININE 0.83 10/28/2022 1159   CREATININE 0.80 12/07/2019 1246   CREATININE 0.81 12/28/2011 0853   CALCIUM 8.9 10/28/2022 1159   GFRNONAA >60 04/30/2022 0222   GFRNONAA >60 12/07/2019 1246   GFRAA 100 10/09/2019 1425   Lab Results  Component Value Date   HGBA1C 5.9 (H) 10/28/2022   HGBA1C 6.2 (H) 10/06/2006   Lab Results  Component Value Date   INSULIN 6.0 11/25/2020   INSULIN 20.9 12/23/2016   Lab Results  Component Value Date   TSH 0.62 05/26/2022   CBC    Component Value Date/Time   WBC 4.8  05/26/2022 1552   RBC 3.84 (L) 05/26/2022 1552   HGB 12.0 05/26/2022 1552   HGB 12.0 04/14/2021 1140   HCT 36.5 05/26/2022 1552   HCT 38.3 04/14/2021 1140   PLT 246.0 05/26/2022 1552   PLT 238 04/14/2021 1140   MCV 95.2 05/26/2022 1552   MCV 94 04/14/2021 1140   MCH 31.2 04/30/2022 0222   MCHC 32.8 05/26/2022 1552   RDW 15.9 (H) 05/26/2022 1552   RDW 13.8 04/14/2021 1140   Iron Studies    Component Value Date/Time   IRON 49 05/26/2022 1552   IRON 42 04/14/2021 1140   TIBC 211.4 (L) 05/26/2022 1552   TIBC  231 (L) 04/14/2021 1140   FERRITIN 288.0 05/26/2022 1552   FERRITIN 338 (H) 04/14/2021 1140   IRONPCTSAT 23.2 05/26/2022 1552   IRONPCTSAT 18 04/14/2021 1140   IRONPCTSAT 17 04/26/2018 1602   Lipid Panel     Component Value Date/Time   CHOL 145 05/26/2022 1552   CHOL 149 11/25/2020 1131   TRIG 111.0 05/26/2022 1552   HDL 62.50 05/26/2022 1552   HDL 62 11/25/2020 1131   CHOLHDL 2 05/26/2022 1552   VLDL 22.2 05/26/2022 1552   LDLCALC 60 05/26/2022 1552   LDLCALC 73 11/25/2020 1131   Hepatic Function Panel     Component Value Date/Time   PROT 7.0 10/28/2022 1159   ALBUMIN 4.0 10/28/2022 1159   AST 29 10/28/2022 1159   AST 19 12/07/2019 1246   ALT 24 10/28/2022 1159   ALT 20 12/07/2019 1246   ALKPHOS 79 10/28/2022 1159   BILITOT 0.2 10/28/2022 1159   BILITOT 0.3 12/07/2019 1246   BILIDIR 0.1 02/15/2015 1010   IBILI 0.2 12/28/2011 0853      Component Value Date/Time   TSH 0.62 05/26/2022 1552   Nutritional Lab Results  Component Value Date   VD25OH 89.5 10/28/2022   VD25OH 48.25 11/21/2021   VD25OH 55.3 04/14/2021     Assessment and Plan    Obesity Has lost another pound in the last month. Following category two eating plan approximately 80% of the time but not currently exercising. Discussed benefits and challenges of a low-carb diet for weight loss and fluid retention. Considering starting a low-carb diet after the holidays. Emphasized managing sodium intake during holidays due to fluid retention issues. - Encourage continuation of food journaling - Discuss low-carb diet plan to start after holidays - Monitor sodium intake, especially during holidays - Schedule follow-up appointment for mid-January  Edema Reports fluid retention, particularly in the left ankle. Taking furosemide but finds it challenging due to frequent urination and mobility issues. Advised setting an alarm to take furosemide at 4 PM if missed earlier dose. - Set alarm to take furosemide at 4  PM if missed earlier dose  Arthritis Reports recent exacerbation of arthritis symptoms with multiple joints affected, likely due to weather changes. Symptoms have improved recently. Finds relief using the elliptical machine. - Encourage use of elliptical machine to manage symptoms  Vitamin D Deficiency Chronic vitamin D deficiency. Requires ongoing supplementation. - Refill vitamin D prescription  General Health Maintenance Discussed the importance of managing stress and emotional well-being, especially given recent family losses and upcoming holidays. Offered support for grief counseling if needed. - Offer grief counseling resources if needed - Encourage stress management techniques  Follow-up - Schedule follow-up appointment for mid-January.        She was informed of the importance of frequent follow up visits to maximize her success with intensive  lifestyle modifications for her multiple health conditions.    Quillian Quince, MD

## 2023-01-14 NOTE — Progress Notes (Signed)
[name] [address] [place] [address] Phone: [phone] Subjective:   [name], am serving as a scribe for Dr. Antoine Primas.  I'm seeing this patient by the request  of:  Pincus Sanes, MD  CC: Pain all over including right knee  [id number]  10/12/2022 Replacement on the left side. Still has some swelling on the right side. Still avoid certain activities. Patient has done relatively well with the weight loss at the moment. Patient's BMI is under 40 and is getting closer to 35. Encourage patient to continue to monitor this. Follow-up with me again in 6 to 8 weeks otherwise.    Update 01/18/2023 Heather [name] is a 77 y.o. female coming in with complaint of B knee pain. Patient states that her R knee has been more painful about a month ago. Pain not just in joint but also in muscles surrounding knee. Did do some sessions of PT which was helpful.   Also c/o bruising over the thighs ranging in size from a 1.5 to 6 inch diameter. Little to no pain. Eventually bruise will go away but she noticed an increase in them when her pain increased.   Also c/o increase in lumbar spine pain and R shoulder pain when taking clothes on and off.       Past Medical History:  Diagnosis Date   Abdominal hernia    Abnormal Pap smear    ALLERGIC RHINITIS 10/22/2006   Allergy    ANEMIA 12/18/2008   Arthritis    scoliosis moderate deg changes lumbar Xray 11/04/17    Asthma    ASYMPTOMATIC POSTMENOPAUSAL STATUS 11/22/2007   Back pain    CAD (coronary artery disease)    in LAD   CHF (congestive heart failure) (HCC)    Complication of anesthesia 2010   pt states she woke up during colonoscopy and was aware of "losing control of breathing" during mammoplasty   Degenerative arthritis    Diverticulosis    Dyslipidemia    Fibroid    Frequent headaches    GERD 07/20/2007   GOITER, MULTINODULAR 07/20/2007   Headache(784.0) 07/20/2007   HEARING  LOSS 11/22/2007   Hiatal hernia    Hiatal hernia    large   History of chicken pox    Hx of colposcopy with cervical biopsy    HYPERCHOLESTEROLEMIA 01/13/2007   Hyperglycemia    HYPERTENSION 10/22/2006   Lung nodule    unchanged since 05/26/13    Migraines    NASH (nonalcoholic steatohepatitis)    Nocturnal hypoxemia 02/17/2013   Obesity    Obstructive sleep apnea    OSA on CPAP    per neurology   OSTEOARTHRITIS 10/22/2006   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   Pre-diabetes    Sedimentation rate elevation    Sleep apnea    on cpap   Urine incontinence    UTI (urinary tract infection)    Past Surgical History:  Procedure Laterality Date   BREAST SURGERY  1984   Breast reduction b/l    CATARACT EXTRACTION, BILATERAL     DEXA  08/2005   DILATION AND CURETTAGE OF UTERUS     ELECTROCARDIOGRAM  10/15/2006   ESOPHAGOGASTRODUODENOSCOPY  12/08/2005   FOOT SURGERY     hammertoe and bunion 05/2017    Stress Cardiolite  10/21/2005   sweat gland removal     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   Procedure: Left TOTAL KNEE ARTHROPLASTY;  Surgeon:  Kathryne Hitch, MD;  Location: [facility] OR;  Service: Orthopedics;  Laterality: Left;   UMBILICAL HERNIA REPAIR N/A 04/25/2022   Procedure: OPEN INCARCERATED HERNIA REPAIR UMBILICAL WITH APPENDECTOMY;  Surgeon: Dossie Der Hyman Hopes, MD;  Location: [facility] OR;  Service: General;  Laterality: N/A;   WISDOM TOOTH EXTRACTION     Social History   Socioeconomic History   Marital status: Married    Spouse name: Darcel Bayley   Number of children: 2   Years of education: [employer] education level: Bachelor's degree (e.g., BA, AB, BS)  Occupational History   Occupation: Retired  Tobacco Use   Smoking status: Former    Current packs/day: 0.00    Average packs/day: 2.0 packs/day for 20.0 years (40.0 ttl pk-yrs)    Types: Cigarettes    Start date: 03/03/1959    Quit date: 03/03/1979    Years since quitting: 43.9   Smokeless  tobacco: Never  Vaping Use   Vaping status: Never Used  Substance and Sexual Activity   Alcohol use: Yes    Comment: rare - TWICE A YR   Drug use: No   Sexual activity: Yes    Birth control/protection: Surgical  Other Topics Concern   Not on file  Social History Narrative   Patient is married Darcel Bayley).   Right Handed   Drinks 1 cup caffeine   Social Determinants of Health   Financial Resource Strain: Low Risk  (11/23/2022)   Overall Financial Resource Strain (CARDIA)    Difficulty of Paying Living Expenses: Not hard at all  Food Insecurity: No Food Insecurity (11/23/2022)   Hunger Vital Sign    Worried About Running Out of Food in the Last Year: Never true    Ran Out of Food in the Last Year: Never true  Transportation Needs: No Transportation Needs (11/23/2022)   PRAPARE - Administrator, Civil Service (Medical): No    Lack of Transportation (Non-Medical): No  Physical Activity: Unknown (11/23/2022)   Exercise Vital Sign    Days of Exercise per Week: Patient declined    Minutes of Exercise per Session: 0 min  Stress: No Stress Concern Present (11/23/2022)   [employer] of Occupational Health - Occupational Stress Questionnaire    Feeling of Stress : Not at all  Social Connections: Socially Integrated (11/23/2022)   Social Connection and Isolation Panel [NHANES]    Frequency of Communication with Friends and Family: Three times a week    Frequency of Social Gatherings with Friends and Family: Twice a week    Attends Religious Services: More than 4 times per year    Active Member of Clubs or Organizations: Yes    Attends Engineer, structural: More than 4 times per year    Marital Status: Married   Allergies  Allergen Reactions   Aspirin Hives   Coconut (Cocos Nucifera) Hives   Lisinopril Cough   Metoprolol Itching   Other     Latex paint, the smell causes hives. Not allergic to latex gloves   General anesthesia - has had issues in past where  anesthesia was not properly administered and caused side effects   Peanut-Containing Drug Products Hives, Itching and Other (See Comments)   Prednisone     Unknown reaction    Strawberry Extract Hives   Penicillins Rash   Family History  Problem Relation Age of Onset   Asthma Mother    Depression Mother    Bipolar disorder Mother    Dementia Mother  Arthritis Mother    Breast cancer Mother        primary   Colon cancer Mother        mets from breast   Cancer Mother        colon   Hypertension Father    Migraines Father    Cancer Maternal Aunt        ?   Heart disease Maternal Grandmother    Cancer Maternal Grandfather        stomach ?   Sleep apnea Neg Hx    Esophageal cancer Neg Hx    Stomach cancer Neg Hx    Rectal cancer Neg Hx      Current Outpatient Medications (Cardiovascular):    ezetimibe (ZETIA) 10 MG tablet, Take 1 tablet (10 mg total) by mouth daily. NEEDS FOLLOW UP APPOINTMENT FOR MORE REFILLS   furosemide (LASIX) 20 MG tablet, Take 1 tablet (20 mg total) by mouth 2 (two) times daily.   rosuvastatin (CRESTOR) 40 MG tablet, Take 1 tablet (40 mg total) by mouth daily. At night  Current Outpatient Medications (Respiratory):    cetirizine (ZYRTEC) 10 MG tablet, Take 10 mg by mouth daily.  Current Outpatient Medications (Analgesics):    acetaminophen (TYLENOL) 500 MG tablet, Take 1,000 mg by mouth 3 (three) times daily as needed for moderate pain.   naproxen sodium (ALEVE) 220 MG tablet, Take 220 mg by mouth as needed.   traMADol (ULTRAM) 50 MG tablet, Take 1 tablet (50 mg total) by mouth 2 (two) times daily as needed.  Current Outpatient Medications (Hematological):    clopidogrel (PLAVIX) 75 MG tablet, Take 1 tablet (75 mg total) by mouth daily.   Cyanocobalamin 1000 MCG CAPS, Take 1 capsule by mouth daily.   Ferrous Sulfate (IRON) 325 (65 FE) MG TABS, Take 1 tablet by mouth daily.  Current Outpatient Medications (Other):    Boswellia-Glucosamine-Vit D  (OSTEO BI-FLEX ONE PER DAY PO), Take 1 tablet by mouth daily.   Calcium Carb-Cholecalciferol (CHEWABLE CALCIUM/D3 PO), Take 1 tablet by mouth daily.   Coenzyme Q10 (CO Q 10 PO), Take 400 mg by mouth.   DULoxetine (CYMBALTA) 20 MG capsule, Take 1 capsule (20 mg total) by mouth daily.   gabapentin (NEURONTIN) 100 MG capsule, TAKE 3 CAPSULES BY MOUTH AT BEDTIME   Multiple Vitamin (MULTIVITAMIN) tablet, Take 1 tablet by mouth daily.   Omega-3 Fatty Acids (FISH OIL PO), Take 2 g by mouth daily.   pantoprazole (PROTONIX) 20 MG tablet, Take 1 tablet (20 mg total) by mouth 2 (two) times daily. 30 min before food   polyethylene glycol (MIRALAX / GLYCOLAX) 17 g packet, Take 17 g by mouth every other day.   potassium chloride SA (KLOR-CON M20) 20 MEQ tablet, Take 1 tablet (20 mEq total) by mouth daily.   pyridoxine (B-6) 200 MG tablet, Take 200 mg by mouth daily.   solifenacin (VESICARE) 10 MG tablet, Take 1 tablet (10 mg total) by mouth daily.   topiramate (TOPAMAX) 25 MG tablet, Take 3 tablets (75 mg total) by mouth at bedtime.   TURMERIC PO, Take 538 mg by mouth daily.   Vitamin D, Ergocalciferol, (DRISDOL) 1.25 MG (50000 UNIT) CAPS capsule, Take 1 capsule (50,000 Units total) by mouth every 7 (seven) days.   vitamin E 200 UNIT capsule, Take 200 Units by mouth daily.   Reviewed prior external information including notes and imaging from  primary care provider As well as notes that were available from care everywhere and  other healthcare systems.  Past medical history, social, surgical and family history all reviewed in electronic medical record.  No pertanent information unless stated regarding to the chief complaint.   Review of Systems:  No headache, visual changes, nausea, vomiting, diarrhea, constipation, dizziness, abdominal pain, skin rash, fevers, chills, night sweats, weight loss, swollen lymph nodes, body aches, joint swelling, chest pain, shortness of breath, mood changes. POSITIVE muscle  aches, joint swelling  Objective  Blood pressure 118/76, pulse 68, height 5\' 4"  (1.626 m), weight 208 lb (94.3 kg), SpO2 98%.   General: No apparent distress alert and oriented x3 mood and affect normal, dressed appropriately.  HEENT: Pupils equal, extraocular movements intact  Respiratory: Patient's speak in full sentences and does not appear short of breath  Cardiovascular: No lower extremity edema, non tender, no erythema  Patient does have an antalgic gait noted.  Right knee does have significant swelling noted.  Crepitus noted with instability with valgus and varus force Low back does have some loss lordosis.  Ambulating with the aid of a cane.  Significant overpronation of the hindfoot noted.  After informed written and verbal consent, patient was seated on exam table. Right knee was prepped with alcohol swab and utilizing anterolateral approach, patient's right knee space was injected with 4:1  marcaine 0.5%: Kenalog 40mg /dL. Patient tolerated the procedure well without immediate complications.    Impression and Recommendations:    The above documentation has been reviewed and is accurate and complete Judi Saa, DO

## 2023-01-18 ENCOUNTER — Encounter: Payer: Self-pay | Admitting: Family Medicine

## 2023-01-18 ENCOUNTER — Ambulatory Visit (INDEPENDENT_AMBULATORY_CARE_PROVIDER_SITE_OTHER): Payer: Medicare Other | Admitting: Family Medicine

## 2023-01-18 VITALS — BP 118/76 | HR 68 | Ht 64.0 in | Wt 208.0 lb

## 2023-01-18 DIAGNOSIS — M170 Bilateral primary osteoarthritis of knee: Secondary | ICD-10-CM | POA: Diagnosis not present

## 2023-01-18 MED ORDER — DULOXETINE HCL 20 MG PO CPEP: 20.0000 mg | ORAL_CAPSULE | Freq: Every day | ORAL | 0 refills | Status: DC

## 2023-01-18 NOTE — Patient Instructions (Signed)
Injected knee today Cymbalta 20mg   See me again in 3 months

## 2023-01-18 NOTE — Assessment & Plan Note (Signed)
Patient given injection in the right knee, doing very well trying to continue to keep control on the weight.  Discussed icing regimen of home exercises, discussed which activities to do and which ones to avoid.  Hopefully patient can ambulate more better this will help with some of the low back pain.  Started Cymbalta as well and warned of potential side effects.  Follow-up with me again in 3 months

## 2023-01-19 ENCOUNTER — Encounter: Payer: Self-pay | Admitting: Family Medicine

## 2023-01-22 ENCOUNTER — Other Ambulatory Visit: Payer: Self-pay | Admitting: Internal Medicine

## 2023-02-09 ENCOUNTER — Encounter (INDEPENDENT_AMBULATORY_CARE_PROVIDER_SITE_OTHER): Payer: Self-pay | Admitting: Family Medicine

## 2023-02-09 ENCOUNTER — Other Ambulatory Visit: Payer: Self-pay | Admitting: Family Medicine

## 2023-02-09 ENCOUNTER — Ambulatory Visit (INDEPENDENT_AMBULATORY_CARE_PROVIDER_SITE_OTHER): Payer: Medicare Other | Admitting: Family Medicine

## 2023-02-09 DIAGNOSIS — E669 Obesity, unspecified: Secondary | ICD-10-CM

## 2023-02-09 DIAGNOSIS — E559 Vitamin D deficiency, unspecified: Secondary | ICD-10-CM | POA: Diagnosis not present

## 2023-02-09 DIAGNOSIS — Z6835 Body mass index (BMI) 35.0-35.9, adult: Secondary | ICD-10-CM | POA: Diagnosis not present

## 2023-02-09 MED ORDER — VITAMIN D (ERGOCALCIFEROL) 1.25 MG (50000 UNIT) PO CAPS: 50000.0000 [IU] | ORAL_CAPSULE | ORAL | 0 refills | Status: DC

## 2023-02-09 NOTE — Progress Notes (Signed)
.smr  Office: [phone]  /  Fax: [phone]  WEIGHT SUMMARY AND BIOMETRICS  Anthropometric Measurements Height: 5\' 4"  (1.626 m) Weight: 208 lb (94.3 kg) BMI (Calculated): 35.69 Weight at Last Visit: 208 lb Weight Lost Since Last Visit: 0 Weight Gained Since Last Visit: 0 Starting Weight: 268 lb Total Weight Loss (lbs): 60 lb (27.2 kg) Peak Weight: 268 lb   Body Composition  Body Fat %: 48.4 % Fat Mass (lbs): 100.6 lbs Muscle Mass (lbs): 102 lbs Total Body Water (lbs): 85.8 lbs Visceral Fat Rating : 16   Other Clinical Data Fasting: no Labs: no Today's Visit #: 90 Starting Date: 12/23/16    Chief Complaint: OBESITY   Discussed the use of AI scribe software for clinical note transcription with the patient, who gave verbal consent to proceed.  History of Present Illness   The patient, diagnosed with vitamin D deficiency and obesity, has been maintaining her weight over the past month, including the Thanksgiving holiday. She has been adhering to a category two eating plan and occasional journaling about 95% of the time. For exercise, she has been using a sitting elliptical for 30 minutes daily.  During the Thanksgiving holiday, the patient indulged in some traditional dishes, including macaroni and cheese, but made accommodations to limit her intake. She also made healthier choices, such as having only one type of meat and including leafy greens in her meal. She has been practicing portion control and mindful eating, which she plans to continue during the Christmas holiday.  The patient has been making vegetable soup as a healthy meal option, which has been well-received by her spouse. She has been using frozen mixed vegetables, chicken stock, and chicken cutlets, making it a simple and quick meal to prepare.  The patient has been experiencing some pain in her right knee, for which she received a shot. She has been using an elliptical machine daily, which she reports  has helped with the knee pain and overall joint mobility. She has been consistent with this exercise routine and reports feeling better overall.  The patient has been taking vitamin D supplements for her deficiency. She took the last pill from her current bottle recently, but she believes she has a new bottle at home. She has been diligent in taking her medication as prescribed.          PHYSICAL EXAM:  Blood pressure 130/69, pulse 72, temperature (!) 97.5 F (36.4 C), height 5\' 4"  (1.626 m), weight 208 lb (94.3 kg), SpO2 100%. Body mass index is 35.7 kg/m.  DIAGNOSTIC DATA REVIEWED:  BMET    Component Value Date/Time   NA 144 10/28/2022 1159   K 4.1 10/28/2022 1159   CL 105 10/28/2022 1159   CO2 24 10/28/2022 1159   GLUCOSE 81 10/28/2022 1159   GLUCOSE 87 05/26/2022 1552   BUN 17 10/28/2022 1159   CREATININE 0.83 10/28/2022 1159   CREATININE 0.80 12/07/2019 1246   CREATININE 0.81 12/28/2011 0853   CALCIUM 8.9 10/28/2022 1159   GFRNONAA >60 04/30/2022 0222   GFRNONAA >60 12/07/2019 1246   GFRAA 100 10/09/2019 1425   Lab Results  Component Value Date   HGBA1C 5.9 (H) 10/28/2022   HGBA1C 6.2 (H) 10/06/2006   Lab Results  Component Value Date   INSULIN 6.0 11/25/2020   INSULIN 20.9 12/23/2016   Lab Results  Component Value Date   TSH 0.62 05/26/2022   CBC    Component Value Date/Time   WBC 4.8 05/26/2022 1552  RBC 3.84 (L) 05/26/2022 1552   HGB 12.0 05/26/2022 1552   HGB 12.0 04/14/2021 1140   HCT 36.5 05/26/2022 1552   HCT 38.3 04/14/2021 1140   PLT 246.0 05/26/2022 1552   PLT 238 04/14/2021 1140   MCV 95.2 05/26/2022 1552   MCV 94 04/14/2021 1140   MCH 31.2 04/30/2022 0222   MCHC 32.8 05/26/2022 1552   RDW 15.9 (H) 05/26/2022 1552   RDW 13.8 04/14/2021 1140   Iron Studies    Component Value Date/Time   IRON 49 05/26/2022 1552   IRON 42 04/14/2021 1140   TIBC 211.4 (L) 05/26/2022 1552   TIBC 231 (L) 04/14/2021 1140   FERRITIN 288.0 05/26/2022  1552   FERRITIN 338 (H) 04/14/2021 1140   IRONPCTSAT 23.2 05/26/2022 1552   IRONPCTSAT 18 04/14/2021 1140   IRONPCTSAT 17 04/26/2018 1602   Lipid Panel     Component Value Date/Time   CHOL 145 05/26/2022 1552   CHOL 149 11/25/2020 1131   TRIG 111.0 05/26/2022 1552   HDL 62.50 05/26/2022 1552   HDL 62 11/25/2020 1131   CHOLHDL 2 05/26/2022 1552   VLDL 22.2 05/26/2022 1552   LDLCALC 60 05/26/2022 1552   LDLCALC 73 11/25/2020 1131   Hepatic Function Panel     Component Value Date/Time   PROT 7.0 10/28/2022 1159   ALBUMIN 4.0 10/28/2022 1159   AST 29 10/28/2022 1159   AST 19 12/07/2019 1246   ALT 24 10/28/2022 1159   ALT 20 12/07/2019 1246   ALKPHOS 79 10/28/2022 1159   BILITOT 0.2 10/28/2022 1159   BILITOT 0.3 12/07/2019 1246   BILIDIR 0.1 02/15/2015 1010   IBILI 0.2 12/28/2011 0853      Component Value Date/Time   TSH 0.62 05/26/2022 1552   Nutritional Lab Results  Component Value Date   VD25OH 89.5 10/28/2022   VD25OH 48.25 11/21/2021   VD25OH 55.3 04/14/2021     Assessment and Plan    Obesity Chronic obesity. Patient adheres to dietary modifications (category two eating plan, occasional journaling) and regular exercise (sitting elliptical for 30 minutes, seven times per week) approximately 95% of the time. Successfully managed weight over Thanksgiving and plans to continue similar strategies for Christmas. Healthier meal options, such as vegetable soup with chicken, have positively influenced her husband's diet. - Continue current dietary plan and exercise regimen. - Follow-up appointment on March 16, 2023, at 11:20 AM. - Schedule an additional follow-up appointment in February 2025.  Vitamin D Deficiency Chronic vitamin D deficiency. Patient compliant with supplementation. Confirmed three-month supply sent in November. - Continue current vitamin D supplementation.  General Health Maintenance Patient actively engaged in health maintenance activities,  including dietary adjustments and regular physical activity. Managing her husband's diet by preparing healthier meals. - Encourage continued healthy eating habits and regular exercise. - Encourage balanced diet, including vegetable soup with chicken for protein. - Encourage involving husband in healthier eating habits.         I have personally spent 30 minutes total time today in preparation, patient care, and documentation for this visit, including the following: review of clinical lab tests; review of medical tests/procedures/services.    She was informed of the importance of frequent follow up visits to maximize her success with intensive lifestyle modifications for her multiple health conditions.    Quillian Quince, MD

## 2023-02-27 ENCOUNTER — Other Ambulatory Visit (HOSPITAL_COMMUNITY): Payer: Self-pay | Admitting: Cardiology

## 2023-03-10 DIAGNOSIS — E119 Type 2 diabetes mellitus without complications: Secondary | ICD-10-CM | POA: Diagnosis not present

## 2023-03-10 DIAGNOSIS — H16143 Punctate keratitis, bilateral: Secondary | ICD-10-CM | POA: Diagnosis not present

## 2023-03-16 ENCOUNTER — Ambulatory Visit (INDEPENDENT_AMBULATORY_CARE_PROVIDER_SITE_OTHER): Payer: Medicare Other | Admitting: Family Medicine

## 2023-03-16 ENCOUNTER — Encounter (INDEPENDENT_AMBULATORY_CARE_PROVIDER_SITE_OTHER): Payer: Self-pay | Admitting: Family Medicine

## 2023-03-16 VITALS — BP 127/68 | HR 64 | Ht 64.0 in | Wt 212.0 lb

## 2023-03-16 DIAGNOSIS — E669 Obesity, unspecified: Secondary | ICD-10-CM

## 2023-03-16 DIAGNOSIS — Z6836 Body mass index (BMI) 36.0-36.9, adult: Secondary | ICD-10-CM | POA: Diagnosis not present

## 2023-03-16 DIAGNOSIS — R7303 Prediabetes: Secondary | ICD-10-CM | POA: Diagnosis not present

## 2023-03-16 NOTE — Progress Notes (Signed)
 .smr  Office: [phone]  /  Fax: [phone]  WEIGHT SUMMARY AND BIOMETRICS  Anthropometric Measurements Height: 5' 4 (1.626 m) Weight: 212 lb (96.2 kg) BMI (Calculated): 36.37 Weight at Last Visit: 208 lb Weight Lost Since Last Visit: 0 Weight Gained Since Last Visit: 4 lb Starting Weight: 268 lb Total Weight Loss (lbs): 56 lb (25.4 kg) Peak Weight: 268 lb   Body Composition  Body Fat %: 49.9 % Fat Mass (lbs): 105.8 lbs Muscle Mass (lbs): 100.8 lbs Total Body Water (lbs): 81.8 lbs Visceral Fat Rating : 16   Other Clinical Data Fasting: No Labs: No Today's Visit #: 51 Starting Date: 12/23/16    Chief Complaint: OBESITY   History of Present Illness   The patient, with a history of prediabetes and obesity, presents for a routine follow-up. She reports a recent weight gain of four pounds over the holiday period due to 'celebration eating.' She is currently working on returning to her category two eating plan and exercising twice a week for thirty minutes on an elliptical machine.  The patient describes experiencing intense cravings for carbohydrates, likening it to a 'carbaholic' phase. She also reports new-onset headaches, different from her previous migraines, which she attributes to irregular eating patterns and possible dehydration. She has increased fluid intake in response.  The patient's dietary habits have been influenced by the holiday season and family dynamics, including cooking traditional meals and eating out more frequently. She expresses a struggle with breakfast meals and is considering incorporating protein drinks into her diet.  In addition to her health concerns, the patient has been dealing with various personal stressors. These include her husband's recent car accident, resulting in the loss of a vehicle, and the challenges of hosting family guests with dietary restrictions. She also mentions the stress related to her long-term involvement in [employer], which she is considering ending due to increasing difficulties and frustrations.  The patient is committed to improving her health, expressing a willingness to explore different dietary plans and strategies. She is also taking steps to manage her personal stressors and is seeking ways to balance her commitments and responsibilities.          PHYSICAL EXAM:  Blood pressure 127/68, pulse 64, height 5' 4 (1.626 m), weight 212 lb (96.2 kg), SpO2 97%. Body mass index is 36.39 kg/m.  DIAGNOSTIC DATA REVIEWED:  BMET    Component Value Date/Time   NA 144 10/28/2022 1159   K 4.1 10/28/2022 1159   CL 105 10/28/2022 1159   CO2 24 10/28/2022 1159   GLUCOSE 81 10/28/2022 1159   GLUCOSE 87 05/26/2022 1552   BUN 17 10/28/2022 1159   CREATININE 0.83 10/28/2022 1159   CREATININE 0.80 12/07/2019 1246   CREATININE 0.81 12/28/2011 0853   CALCIUM  8.9 10/28/2022 1159   GFRNONAA >60 04/30/2022 0222   GFRNONAA >60 12/07/2019 1246   GFRAA 100 10/09/2019 1425   Lab Results  Component Value Date   HGBA1C 5.9 (H) 10/28/2022   HGBA1C 6.2 (H) 10/06/2006   Lab Results  Component Value Date   INSULIN  6.0 11/25/2020   INSULIN  20.9 12/23/2016   Lab Results  Component Value Date   TSH 0.62 05/26/2022   CBC    Component Value Date/Time   WBC 4.8 05/26/2022 1552   RBC 3.84 (L) 05/26/2022 1552   HGB 12.0 05/26/2022 1552   HGB 12.0 04/14/2021 1140   HCT 36.5 05/26/2022 1552   HCT 38.3 04/14/2021 1140   PLT 246.0 05/26/2022  1552   PLT 238 04/14/2021 1140   MCV 95.2 05/26/2022 1552   MCV 94 04/14/2021 1140   MCH 31.2 04/30/2022 0222   MCHC 32.8 05/26/2022 1552   RDW 15.9 (H) 05/26/2022 1552   RDW 13.8 04/14/2021 1140   Iron Studies    Component Value Date/Time   IRON 49 05/26/2022 1552   IRON 42 04/14/2021 1140   TIBC 211.4 (L) 05/26/2022 1552   TIBC 231 (L) 04/14/2021 1140   FERRITIN 288.0 05/26/2022 1552   FERRITIN 338 (H) 04/14/2021 1140   IRONPCTSAT 23.2 05/26/2022 1552    IRONPCTSAT 18 04/14/2021 1140   IRONPCTSAT 17 04/26/2018 1602   Lipid Panel     Component Value Date/Time   CHOL 145 05/26/2022 1552   CHOL 149 11/25/2020 1131   TRIG 111.0 05/26/2022 1552   HDL 62.50 05/26/2022 1552   HDL 62 11/25/2020 1131   CHOLHDL 2 05/26/2022 1552   VLDL 22.2 05/26/2022 1552   LDLCALC 60 05/26/2022 1552   LDLCALC 73 11/25/2020 1131   Hepatic Function Panel     Component Value Date/Time   PROT 7.0 10/28/2022 1159   ALBUMIN 4.0 10/28/2022 1159   AST 29 10/28/2022 1159   AST 19 12/07/2019 1246   ALT 24 10/28/2022 1159   ALT 20 12/07/2019 1246   ALKPHOS 79 10/28/2022 1159   BILITOT 0.2 10/28/2022 1159   BILITOT 0.3 12/07/2019 1246   BILIDIR 0.1 02/15/2015 1010   IBILI 0.2 12/28/2011 0853      Component Value Date/Time   TSH 0.62 05/26/2022 1552   Nutritional Lab Results  Component Value Date   VD25OH 89.5 10/28/2022   VD25OH 48.25 11/21/2021   VD25OH 55.3 04/14/2021     Assessment and Plan    Prediabetes Prediabetes with recent weight gain of four pounds over the holidays. Currently following a category two eating plan and using an elliptical for exercise. Increased carbohydrate cravings likely due to recent dietary indulgences. Headaches possibly related to irregular eating times and hydration issues. Discussed regular meal times and increased fluid intake to manage headaches. Considered a short detox for carbohydrate cravings. Reviewed vegetarian plan as an alternative dietary option. - Continue category two eating plan - Increase fluid intake - Ensure regular meal times to prevent headaches - Consider short detox for carbohydrate cravings - Review vegetarian plan for additional dietary options  Obesity Obesity with recent weight gain attributed to holiday eating. Actively working on diet and exercise. Current exercise regimen includes using an elliptical for 30 minutes, twice a week. Challenges with breakfast options and maintaining  interest in protein drinks. Discussed benefits of journaling to track dietary intake and deviations. Reviewed vegetarian plan for additional dietary options. - Continue current exercise regimen - Explore new breakfast options, including protein drinks - Consider journaling to track dietary intake and deviations - Review vegetarian plan for additional dietary options  General Health Maintenance Managing prediabetes and obesity through diet and exercise. - Schedule next appointment for April 21, 2023, at 12:20 PM - Schedule March appointment.         I have personally spent 35 minutes total time today in preparation, patient care, and documentation for this visit, including the following: review of clinical lab tests; review of medical tests/procedures/services.    She was informed of the importance of frequent follow up visits to maximize her success with intensive lifestyle modifications for her multiple health conditions.    Louann Penton, MD

## 2023-04-01 ENCOUNTER — Ambulatory Visit (INDEPENDENT_AMBULATORY_CARE_PROVIDER_SITE_OTHER): Payer: Medicare Other | Admitting: Urology

## 2023-04-01 ENCOUNTER — Encounter: Payer: Self-pay | Admitting: Urology

## 2023-04-01 VITALS — BP 132/79 | HR 67 | Ht 64.0 in | Wt 209.0 lb

## 2023-04-01 DIAGNOSIS — N3941 Urge incontinence: Secondary | ICD-10-CM | POA: Diagnosis not present

## 2023-04-01 NOTE — Progress Notes (Signed)
Assessment: 1. Urge incontinence     Plan: She would like to try discontinuing the Solifenacin and monitor her symptoms. Timed and double voiding to make sure bladder empties completely I provided information on PTNS. She will contact me in approximately 1 month with an update on her symptoms.  Chief Complaint:  Chief Complaint  Patient presents with   Urinary Incontinence    History of Present Illness:  Heather [name] is a 78 y.o. female who is seen for further evaluation of urge incontinence.  She has had symptoms for >1 year.  She has frequency, urgency, and urge incontinence.  No nocturia or nocturnal incontinence.  She is wearing adult diapers daily, changing at least 1 time/day.  No dysuria or gross hematuria.  She had been on trospium 20 mg BID without improvement in her symptoms.  She had not tried any other medical therapy.   No recent UTI's.   No fecal incontinence.  No significant problems with constipation. She was given a trial of Solifenacin 10 mg daily at her visit in June 2024. Urine culture grew 25-50 K of group B strep. PVR = 4 ml  She turns today for scheduled follow-up.  She has continued on Solifenacin 10 mg daily.  She continues to have symptoms of frequency, urgency, and occasional urge incontinence.  She is currently voiding every hour to prevent incontinence episodes.  No dysuria or gross hematuria.  Portions of the above documentation were copied from a prior visit for review purposes only.   Past Medical History:  Past Medical History:  Diagnosis Date   Abdominal hernia    Abnormal Pap smear    ALLERGIC RHINITIS 10/22/2006   Allergy    ANEMIA 12/18/2008   Arthritis    scoliosis moderate deg changes lumbar Xray 11/04/17    Asthma    ASYMPTOMATIC POSTMENOPAUSAL STATUS 11/22/2007   Back pain    CAD (coronary artery disease)    in LAD   CHF (congestive heart failure) (HCC)    Complication of anesthesia 2010   pt states she woke up during  colonoscopy and was aware of "losing control of breathing" during mammoplasty   Degenerative arthritis    Diverticulosis    Dyslipidemia    Fibroid    Frequent headaches    GERD 07/20/2007   GOITER, MULTINODULAR 07/20/2007   Headache(784.0) 07/20/2007   HEARING LOSS 11/22/2007   Hiatal hernia    Hiatal hernia    large   History of chicken pox    Hx of colposcopy with cervical biopsy    HYPERCHOLESTEROLEMIA 01/13/2007   Hyperglycemia    HYPERTENSION 10/22/2006   Lung nodule    unchanged since 05/26/13    Migraines    NASH (nonalcoholic steatohepatitis)    Nocturnal hypoxemia 02/17/2013   Obesity    Obstructive sleep apnea    OSA on CPAP    per neurology   OSTEOARTHRITIS 10/22/2006   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   Pre-diabetes    Sedimentation rate elevation    Sleep apnea    on cpap   Urine incontinence    UTI (urinary tract infection)     Past Surgical History:  Past Surgical History:  Procedure Laterality Date   BREAST SURGERY  1984   Breast reduction b/l    CATARACT EXTRACTION, BILATERAL     DEXA  08/2005   DILATION AND CURETTAGE OF UTERUS     ELECTROCARDIOGRAM  10/15/2006   ESOPHAGOGASTRODUODENOSCOPY  12/08/2005   FOOT  SURGERY     hammertoe and bunion 05/2017    Stress Cardiolite  10/21/2005   sweat gland removal     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   Procedure: Left TOTAL KNEE ARTHROPLASTY;  Surgeon: Kathryne Hitch, MD;  Location: [facility] OR;  Service: Orthopedics;  Laterality: Left;   UMBILICAL HERNIA REPAIR N/A 04/25/2022   Procedure: OPEN INCARCERATED HERNIA REPAIR UMBILICAL WITH APPENDECTOMY;  Surgeon: Quentin Ore, MD;  Location: [facility] OR;  Service: General;  Laterality: N/A;   WISDOM TOOTH EXTRACTION      Allergies:  Allergies  Allergen Reactions   Aspirin Hives   Coconut (Cocos Nucifera) Hives   Lisinopril Cough   Metoprolol Itching   Other     Latex paint, the smell causes hives. Not allergic to latex  gloves   General anesthesia - has had issues in past where anesthesia was not properly administered and caused side effects   Peanut-Containing Drug Products Hives, Itching and Other (See Comments)   Prednisone     Unknown reaction    Strawberry Extract Hives   Penicillins Rash    Family History:  Family History  Problem Relation Age of Onset   Asthma Mother    Depression Mother    Bipolar disorder Mother    Dementia Mother    Arthritis Mother    Breast cancer Mother        primary   Colon cancer Mother        mets from breast   Cancer Mother        colon   Hypertension Father    Migraines Father    Cancer Maternal Aunt        ?   Heart disease Maternal Grandmother    Cancer Maternal Grandfather        stomach ?   Sleep apnea Neg Hx    Esophageal cancer Neg Hx    Stomach cancer Neg Hx    Rectal cancer Neg Hx     Social History:  Social History   Tobacco Use   Smoking status: Former    Current packs/day: 0.00    Average packs/day: 2.0 packs/day for 20.0 years (40.0 ttl pk-yrs)    Types: Cigarettes    Start date: 03/03/1959    Quit date: 03/03/1979    Years since quitting: 44.1   Smokeless tobacco: Never  Vaping Use   Vaping status: Never Used  Substance Use Topics   Alcohol use: Yes    Comment: rare - TWICE A YR   Drug use: No    ROS: Constitutional:  Negative for fever, chills, weight loss CV: Negative for chest pain, previous MI, hypertension Respiratory:  Negative for shortness of breath, wheezing, sleep apnea, frequent cough GI:  Negative for nausea, vomiting, bloody stool, GERD  Physical exam: BP 132/79   Pulse 67   Ht 5\' 4"  (1.626 m)   Wt 209 lb (94.8 kg)   BMI 35.87 kg/m  GENERAL APPEARANCE:  Well appearing, well developed, well nourished, NAD HEENT:  Atraumatic, normocephalic, oropharynx clear NECK:  Supple without lymphadenopathy or thyromegaly ABDOMEN:  Soft, non-tender, no masses EXTREMITIES:  Moves all extremities well, without  clubbing, cyanosis, or edema NEUROLOGIC:  Alert and oriented x 3, normal gait, CN II-XII grossly intact MENTAL STATUS:  appropriate BACK:  Non-tender to palpation, No CVAT SKIN:  Warm, dry, and intact  Results: U/A: 0-5 WBCs, 0-2 RBCs

## 2023-04-06 ENCOUNTER — Encounter: Payer: Self-pay | Admitting: Podiatry

## 2023-04-06 ENCOUNTER — Ambulatory Visit (INDEPENDENT_AMBULATORY_CARE_PROVIDER_SITE_OTHER): Payer: Medicare Other | Admitting: Podiatry

## 2023-04-06 DIAGNOSIS — M79676 Pain in unspecified toe(s): Secondary | ICD-10-CM | POA: Diagnosis not present

## 2023-04-06 DIAGNOSIS — D689 Coagulation defect, unspecified: Secondary | ICD-10-CM

## 2023-04-06 DIAGNOSIS — B351 Tinea unguium: Secondary | ICD-10-CM

## 2023-04-09 LAB — MICROSCOPIC EXAMINATION

## 2023-04-09 LAB — URINALYSIS, ROUTINE W REFLEX MICROSCOPIC
Bilirubin, UA: NEGATIVE
Glucose, UA: NEGATIVE
Ketones, UA: NEGATIVE
Nitrite, UA: NEGATIVE
Protein,UA: NEGATIVE
Specific Gravity, UA: 1.015 (ref 1.005–1.030)
Urobilinogen, Ur: 0.2 mg/dL (ref 0.2–1.0)
pH, UA: 7 (ref 5.0–7.5)

## 2023-04-11 NOTE — Progress Notes (Signed)
 Subjective: Chief Complaint  Patient presents with   RFC    RM#[id number] RFC    78 year old female presents the office today for concerns.  Denies any open lesions or any new concerns today.  On Plavix   Objective: AAO x3, NAD DP/PT pulses palpable bilaterally, CRT less than 3 seconds Nails are hypertrophic, dystrophic, brittle, discolored, elongated 10. No surrounding redness or drainage. Tenderness nails 1-5 bilaterally.  No pain with calf compression, swelling, warmth, erythema  Assessment: Symptomatic onychomycosis  Plan: -All treatment options discussed with the patient including all alternatives, risks, complications.  -Sharply debrided nails x 10 without any complications or bleeding. -Patient encouraged to call the office with any questions, concerns, change in symptoms.   Donnice JONELLE Fees DPM

## 2023-04-20 ENCOUNTER — Encounter: Payer: Self-pay | Admitting: Family Medicine

## 2023-04-20 ENCOUNTER — Encounter (INDEPENDENT_AMBULATORY_CARE_PROVIDER_SITE_OTHER): Payer: Medicare Other | Admitting: Family Medicine

## 2023-04-20 ENCOUNTER — Ambulatory Visit: Payer: Medicare Other | Admitting: Family Medicine

## 2023-04-20 VITALS — BP 118/72 | HR 62 | Ht 64.0 in | Wt 209.0 lb

## 2023-04-20 DIAGNOSIS — M170 Bilateral primary osteoarthritis of knee: Secondary | ICD-10-CM

## 2023-04-20 MED ORDER — DULOXETINE HCL 20 MG PO CPEP: 20.0000 mg | ORAL_CAPSULE | Freq: Every day | ORAL | 0 refills | Status: DC

## 2023-04-20 NOTE — Patient Instructions (Signed)
Refilled Cymbalta Keep doing what you are doing You are doing amazing See me 3-4 months

## 2023-04-20 NOTE — Progress Notes (Signed)
[name] [address] [place] [address] Phone: [phone] Subjective:   Heather [name], am serving as a scribe for Dr. Antoine Primas.  I'm seeing this patient by the request  of:  Pincus Sanes, MD  CC: Multiple joint complaints  ION:[id number]  Heather [name] is a 78 y.o. female coming in with complaint of multiple joint complaints but did start on the Cymbalta.  Last message in November patient was feeling better on this medication.  Patient states that her knee and back are doing good. Using heat at night for back and riding elliptical 3-7x a week which is loosening up her knees. If she has to stand for a prolonged period she does not have endurance to stand without leaning against a wall. Able to tell difference with barometric pressure changes.   Shoulder ROM is limited and she is weak at times when shoulder is above 90 degrees.   Pain in feet despite using spenco total support orthotics. Pain in this area is the worst throughout the body.   Cymbalta seems to have eased her intense pain.     Past Medical History:  Diagnosis Date   Abdominal hernia    Abnormal Pap smear    ALLERGIC RHINITIS 10/22/2006   Allergy    ANEMIA 12/18/2008   Arthritis    scoliosis moderate deg changes lumbar Xray 11/04/17    Asthma    ASYMPTOMATIC POSTMENOPAUSAL STATUS 11/22/2007   Back pain    CAD (coronary artery disease)    in LAD   CHF (congestive heart failure) (HCC)    Complication of anesthesia 2010   pt states she woke up during colonoscopy and was aware of "losing control of breathing" during mammoplasty   Degenerative arthritis    Diverticulosis    Dyslipidemia    Fibroid    Frequent headaches    GERD 07/20/2007   GOITER, MULTINODULAR 07/20/2007   Headache(784.0) 07/20/2007   HEARING LOSS 11/22/2007   Hiatal hernia    Hiatal hernia    large   History of chicken pox    Hx of colposcopy with cervical biopsy    HYPERCHOLESTEROLEMIA  01/13/2007   Hyperglycemia    HYPERTENSION 10/22/2006   Lung nodule    unchanged since 05/26/13    Migraines    NASH (nonalcoholic steatohepatitis)    Nocturnal hypoxemia 02/17/2013   Obesity    Obstructive sleep apnea    OSA on CPAP    per neurology   OSTEOARTHRITIS 10/22/2006   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   Pre-diabetes    Sedimentation rate elevation    Sleep apnea    on cpap   Urine incontinence    UTI (urinary tract infection)    Past Surgical History:  Procedure Laterality Date   BREAST SURGERY  1984   Breast reduction b/l    CATARACT EXTRACTION, BILATERAL     DEXA  08/2005   DILATION AND CURETTAGE OF UTERUS     ELECTROCARDIOGRAM  10/15/2006   ESOPHAGOGASTRODUODENOSCOPY  12/08/2005   FOOT SURGERY     hammertoe and bunion 05/2017    Stress Cardiolite  10/21/2005   sweat gland removal     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   Procedure: Left TOTAL KNEE ARTHROPLASTY;  Surgeon: Kathryne Hitch, MD;  Location: [facility] OR;  Service: Orthopedics;  Laterality: Left;   UMBILICAL HERNIA REPAIR N/A 04/25/2022   Procedure: OPEN INCARCERATED HERNIA REPAIR UMBILICAL WITH APPENDECTOMY;  Surgeon: Dossie Der,  Hyman Hopes, MD;  Location: [facility] OR;  Service: General;  Laterality: N/A;   WISDOM TOOTH EXTRACTION     Social History   Socioeconomic History   Marital status: Married    Spouse name: Darcel Bayley   Number of children: 2   Years of education: [employer] education level: Bachelor's degree (e.g., BA, AB, BS)  Occupational History   Occupation: Retired  Tobacco Use   Smoking status: Former    Current packs/day: 0.00    Average packs/day: 2.0 packs/day for 20.0 years (40.0 ttl pk-yrs)    Types: Cigarettes    Start date: 03/03/1959    Quit date: 03/03/1979    Years since quitting: 44.1   Smokeless tobacco: Never  Vaping Use   Vaping status: Never Used  Substance and Sexual Activity   Alcohol use: Yes    Comment: rare - TWICE A YR   Drug use: No    Sexual activity: Yes    Birth control/protection: Surgical  Other Topics Concern   Not on file  Social History Narrative   Patient is married Darcel Bayley).   Right Handed   Drinks 1 cup caffeine   Social Drivers of Corporate investment banker Strain: Low Risk  (11/23/2022)   Overall Financial Resource Strain (CARDIA)    Difficulty of Paying Living Expenses: Not hard at all  Food Insecurity: No Food Insecurity (11/23/2022)   Hunger Vital Sign    Worried About Running Out of Food in the Last Year: Never true    Ran Out of Food in the Last Year: Never true  Transportation Needs: No Transportation Needs (11/23/2022)   PRAPARE - Administrator, Civil Service (Medical): No    Lack of Transportation (Non-Medical): No  Physical Activity: Unknown (11/23/2022)   Exercise Vital Sign    Days of Exercise per Week: Patient declined    Minutes of Exercise per Session: 0 min  Stress: No Stress Concern Present (11/23/2022)   [employer] of Occupational Health - Occupational Stress Questionnaire    Feeling of Stress : Not at all  Social Connections: Socially Integrated (11/23/2022)   Social Connection and Isolation Panel [NHANES]    Frequency of Communication with Friends and Family: Three times a week    Frequency of Social Gatherings with Friends and Family: Twice a week    Attends Religious Services: More than 4 times per year    Active Member of Clubs or Organizations: Yes    Attends Engineer, structural: More than 4 times per year    Marital Status: Married   Allergies  Allergen Reactions   Aspirin Hives   Coconut (Cocos Nucifera) Hives   Lisinopril Cough   Metoprolol Itching   Other     Latex paint, the smell causes hives. Not allergic to latex gloves   General anesthesia - has had issues in past where anesthesia was not properly administered and caused side effects   Peanut-Containing Drug Products Hives, Itching and Other (See Comments)   Prednisone      Unknown reaction    Strawberry Extract Hives   Penicillins Rash   Family History  Problem Relation Age of Onset   Asthma Mother    Depression Mother    Bipolar disorder Mother    Dementia Mother    Arthritis Mother    Breast cancer Mother        primary   Colon cancer Mother        mets from breast  Cancer Mother        colon   Hypertension Father    Migraines Father    Cancer Maternal Aunt        ?   Heart disease Maternal Grandmother    Cancer Maternal Grandfather        stomach ?   Sleep apnea Neg Hx    Esophageal cancer Neg Hx    Stomach cancer Neg Hx    Rectal cancer Neg Hx      Current Outpatient Medications (Cardiovascular):    ezetimibe (ZETIA) 10 MG tablet, TAKE 1 TABLET BY MOUTH ONCE DAILY NEEDS  FOLLOW  UP  APPOINTMENT  FOR  REFILL   furosemide (LASIX) 20 MG tablet, TAKE 1 TABLET TWICE A DAY   rosuvastatin (CRESTOR) 40 MG tablet, Take 1 tablet (40 mg total) by mouth daily. At night  Current Outpatient Medications (Respiratory):    cetirizine (ZYRTEC) 10 MG tablet, Take 10 mg by mouth daily.  Current Outpatient Medications (Analgesics):    acetaminophen (TYLENOL) 500 MG tablet, Take 1,000 mg by mouth 3 (three) times daily as needed for moderate pain.   naproxen sodium (ALEVE) 220 MG tablet, Take 220 mg by mouth as needed.   traMADol (ULTRAM) 50 MG tablet, Take 1 tablet (50 mg total) by mouth 2 (two) times daily as needed.  Current Outpatient Medications (Hematological):    clopidogrel (PLAVIX) 75 MG tablet, Take 1 tablet (75 mg total) by mouth daily.   Cyanocobalamin 1000 MCG CAPS, Take 1 capsule by mouth daily.   Ferrous Sulfate (IRON) 325 (65 FE) MG TABS, Take 1 tablet by mouth daily.  Current Outpatient Medications (Other):    Boswellia-Glucosamine-Vit D (OSTEO BI-FLEX ONE PER DAY PO), Take 1 tablet by mouth daily.   Calcium Carb-Cholecalciferol (CHEWABLE CALCIUM/D3 PO), Take 1 tablet by mouth daily.   Coenzyme Q10 (CO Q 10 PO), Take 400 mg by  mouth.   DULoxetine (CYMBALTA) 20 MG capsule, Take 1 capsule (20 mg total) by mouth daily.   gabapentin (NEURONTIN) 100 MG capsule, TAKE 3 CAPSULES BY MOUTH AT BEDTIME   Multiple Vitamin (MULTIVITAMIN) tablet, Take 1 tablet by mouth daily.   Omega-3 Fatty Acids (FISH OIL PO), Take 2 g by mouth daily.   pantoprazole (PROTONIX) 20 MG tablet, Take 1 tablet (20 mg total) by mouth 2 (two) times daily. 30 min before food   polyethylene glycol (MIRALAX / GLYCOLAX) 17 g packet, Take 17 g by mouth every other day.   potassium chloride SA (KLOR-CON M20) 20 MEQ tablet, Take 1 tablet (20 mEq total) by mouth daily.   pyridoxine (B-6) 200 MG tablet, Take 200 mg by mouth daily.   solifenacin (VESICARE) 10 MG tablet, Take 1 tablet (10 mg total) by mouth daily.   topiramate (TOPAMAX) 25 MG tablet, Take 3 tablets (75 mg total) by mouth at bedtime.   TURMERIC PO, Take 538 mg by mouth daily.   Vitamin D, Ergocalciferol, (DRISDOL) 1.25 MG (50000 UNIT) CAPS capsule, Take 1 capsule (50,000 Units total) by mouth every 7 (seven) days.   vitamin E 200 UNIT capsule, Take 200 Units by mouth daily.   Reviewed prior external information including notes and imaging from  primary care provider As well as notes that were available from care everywhere and other healthcare systems.  Past medical history, social, surgical and family history all reviewed in electronic medical record.  No pertanent information unless stated regarding to the chief complaint.   Review of Systems:  No headache,  visual changes, nausea, vomiting, diarrhea, constipation, dizziness, abdominal pain, skin rash, fevers, chills, night sweats, weight loss, swollen lymph nodes, body aches, joint swelling, chest pain, shortness of breath, mood changes. POSITIVE muscle aches  Objective  Blood pressure 118/72, pulse 62, height 5\' 4"  (1.626 m), weight 209 lb (94.8 kg), SpO2 95%.   General: No apparent distress alert and oriented x3 mood and affect normal,  dressed appropriately.  HEENT: Pupils equal, extraocular movements intact  Respiratory: Patient's speak in full sentences and does not appear short of breath  Cardiovascular: No lower extremity edema, non tender, no erythema  Patient is sitting comfortably of the lower.  Does have some trace effusion noted of the knees bilaterally but seems to be doing well.  Sitting comfortably.  Some difficulty going from seated to standing.    Impression and Recommendations:     The above documentation has been reviewed and is accurate and complete Judi Saa, DO

## 2023-04-20 NOTE — Assessment & Plan Note (Signed)
Discussed with patient to continue to do what she is doing.  Will continue to monitor the weight that she has done well and BMI is under 35.  Would be a surgical candidate if she needed but not at the moment doing very well with this as well as the low dose of the Cymbalta.  We discussed potentially increasing to 30 mg which patient declined.  Follow-up with me again 3 months

## 2023-04-21 ENCOUNTER — Ambulatory Visit (INDEPENDENT_AMBULATORY_CARE_PROVIDER_SITE_OTHER): Payer: Medicare Other | Admitting: Family Medicine

## 2023-04-21 MED ORDER — VITAMIN D (ERGOCALCIFEROL) 1.25 MG (50000 UNIT) PO CAPS: 50000.0000 [IU] | ORAL_CAPSULE | ORAL | 0 refills | Status: DC

## 2023-04-27 NOTE — Progress Notes (Signed)
 error

## 2023-05-05 ENCOUNTER — Other Ambulatory Visit: Payer: Self-pay | Admitting: Internal Medicine

## 2023-05-05 DIAGNOSIS — R609 Edema, unspecified: Secondary | ICD-10-CM

## 2023-05-05 DIAGNOSIS — E119 Type 2 diabetes mellitus without complications: Secondary | ICD-10-CM

## 2023-05-05 DIAGNOSIS — K219 Gastro-esophageal reflux disease without esophagitis: Secondary | ICD-10-CM

## 2023-05-07 ENCOUNTER — Encounter (HOSPITAL_COMMUNITY): Payer: Self-pay | Admitting: Cardiology

## 2023-05-10 MED ORDER — EZETIMIBE 10 MG PO TABS: 10.0000 mg | ORAL_TABLET | Freq: Every day | ORAL | 0 refills | Status: DC

## 2023-05-19 ENCOUNTER — Ambulatory Visit (INDEPENDENT_AMBULATORY_CARE_PROVIDER_SITE_OTHER): Payer: Medicare Other | Admitting: Family Medicine

## 2023-05-19 ENCOUNTER — Encounter (INDEPENDENT_AMBULATORY_CARE_PROVIDER_SITE_OTHER): Payer: Self-pay | Admitting: Family Medicine

## 2023-05-19 VITALS — BP 144/84 | HR 55 | Temp 98.0°F | Ht 64.0 in | Wt 203.0 lb

## 2023-05-19 DIAGNOSIS — Z6834 Body mass index (BMI) 34.0-34.9, adult: Secondary | ICD-10-CM | POA: Diagnosis not present

## 2023-05-19 DIAGNOSIS — R030 Elevated blood-pressure reading, without diagnosis of hypertension: Secondary | ICD-10-CM | POA: Diagnosis not present

## 2023-05-19 DIAGNOSIS — E669 Obesity, unspecified: Secondary | ICD-10-CM | POA: Diagnosis not present

## 2023-05-19 NOTE — Progress Notes (Signed)
 Office: [phone]  /  Fax: [phone]  WEIGHT SUMMARY AND BIOMETRICS  Anthropometric Measurements Height: 5\' 4"  (1.626 m) Weight: 203 lb (92.1 kg) BMI (Calculated): 34.83 Weight at Last Visit: 212 lb Weight Lost Since Last Visit: 9 lb   Body Composition  Body Fat %: 47.6 % Fat Mass (lbs): 96.8 lbs Muscle Mass (lbs): 101.2 lbs Total Body Water (lbs): 76.8 lbs Visceral Fat Rating : 15   Other Clinical Data Today's Visit #: 63    Chief Complaint: OBESITY   History of Present Illness   Heather [name] is a 78 year old female who presents for evaluation of her obesity treatment plan and progress.  She has been adhering to a category two eating plan and consistently journaling her food intake. Her exercise regimen includes 30 to 45 minutes of activity seven times per week, resulting in a weight loss of nine pounds over the last two months.  Her diet primarily consists of fish and seafood, such as lobster, scallops, flounder, salmon, perch, and tuna, while she avoids beef and consumes minimal chicken. She prepares large pots of soup for convenience and includes pre-made salads with added tuna and a small amount of macaroni salad for variety. She allows herself two cookies at the end of the day, which she eats slowly to savor them.  Her exercise routine involves daily use of an elliptical machine, sometimes extending to 45 minutes depending on her reading material. She has also started chair yoga for seniors, which her husband has joined. She notes that her body feels 'angry' if she skips exercise, indicating a positive adaptation to her routine.  She has a history of hypertension and noted that her blood pressure was slightly elevated during the visit, although it normalized upon retesting. She is currently taking vitamin D supplements and does not require any refills at this time.          PHYSICAL EXAM:  Blood pressure (!) 144/84, pulse (!) 55, temperature 98 F (36.7  C), height 5\' 4"  (1.626 m), weight 203 lb (92.1 kg), SpO2 99%. Body mass index is 34.84 kg/m.  DIAGNOSTIC DATA REVIEWED:  BMET    Component Value Date/Time   NA 144 10/28/2022 1159   K 4.1 10/28/2022 1159   CL 105 10/28/2022 1159   CO2 24 10/28/2022 1159   GLUCOSE 81 10/28/2022 1159   GLUCOSE 87 05/26/2022 1552   BUN 17 10/28/2022 1159   CREATININE 0.83 10/28/2022 1159   CREATININE 0.80 12/07/2019 1246   CREATININE 0.81 12/28/2011 0853   CALCIUM 8.9 10/28/2022 1159   GFRNONAA >60 04/30/2022 0222   GFRNONAA >60 12/07/2019 1246   GFRAA 100 10/09/2019 1425   Lab Results  Component Value Date   HGBA1C 5.9 (H) 10/28/2022   HGBA1C 6.2 (H) 10/06/2006   Lab Results  Component Value Date   INSULIN 6.0 11/25/2020   INSULIN 20.9 12/23/2016   Lab Results  Component Value Date   TSH 0.62 05/26/2022   CBC    Component Value Date/Time   WBC 4.8 05/26/2022 1552   RBC 3.84 (L) 05/26/2022 1552   HGB 12.0 05/26/2022 1552   HGB 12.0 04/14/2021 1140   HCT 36.5 05/26/2022 1552   HCT 38.3 04/14/2021 1140   PLT 246.0 05/26/2022 1552   PLT 238 04/14/2021 1140   MCV 95.2 05/26/2022 1552   MCV 94 04/14/2021 1140   MCH 31.2 04/30/2022 0222   MCHC 32.8 05/26/2022 1552   RDW 15.9 (H) 05/26/2022 1552  RDW 13.8 04/14/2021 1140   Iron Studies    Component Value Date/Time   IRON 49 05/26/2022 1552   IRON 42 04/14/2021 1140   TIBC 211.4 (L) 05/26/2022 1552   TIBC 231 (L) 04/14/2021 1140   FERRITIN 288.0 05/26/2022 1552   FERRITIN 338 (H) 04/14/2021 1140   IRONPCTSAT 23.2 05/26/2022 1552   IRONPCTSAT 18 04/14/2021 1140   IRONPCTSAT 17 04/26/2018 1602   Lipid Panel     Component Value Date/Time   CHOL 145 05/26/2022 1552   CHOL 149 11/25/2020 1131   TRIG 111.0 05/26/2022 1552   HDL 62.50 05/26/2022 1552   HDL 62 11/25/2020 1131   CHOLHDL 2 05/26/2022 1552   VLDL 22.2 05/26/2022 1552   LDLCALC 60 05/26/2022 1552   LDLCALC 73 11/25/2020 1131   Hepatic Function Panel      Component Value Date/Time   PROT 7.0 10/28/2022 1159   ALBUMIN 4.0 10/28/2022 1159   AST 29 10/28/2022 1159   AST 19 12/07/2019 1246   ALT 24 10/28/2022 1159   ALT 20 12/07/2019 1246   ALKPHOS 79 10/28/2022 1159   BILITOT 0.2 10/28/2022 1159   BILITOT 0.3 12/07/2019 1246   BILIDIR 0.1 02/15/2015 1010   IBILI 0.2 12/28/2011 0853      Component Value Date/Time   TSH 0.62 05/26/2022 1552   Nutritional Lab Results  Component Value Date   VD25OH 89.5 10/28/2022   VD25OH 48.25 11/21/2021   VD25OH 55.3 04/14/2021     Assessment and Plan    Obesity She is actively working on weight loss, having lost nine pounds in the last two months. She follows a category two eating plan, journals her food intake, and exercises for 30-45 minutes seven times per week. She incorporates more fish and seafood into her diet and practices portion control. She is motivated and aims to reach 199 pounds. - Continue current diet and exercise regimen - Encourage continued portion control and healthy eating habits - Follow up in one month to monitor progress  Elevated BP without HTN Her blood pressure was initially elevated but normalized upon retesting, possibly due to excitement or stress. She is aware of the need for regular blood pressure monitoring. - Monitor blood pressure regularly - Follow up in one month to reassess blood pressure  General Health Maintenance She engages in regular physical activity, including elliptical exercises and chair yoga, and is considering gardening with patio containers for additional activity. She understands the benefits of an active lifestyle. - Encourage continued physical activity and healthy lifestyle choices         I have personally spent 30 minutes total time today in preparation, patient care, and documentation for this visit, including the following: review of clinical lab tests; review of medical tests/procedures/services.    She was informed of the  importance of frequent follow up visits to maximize her success with intensive lifestyle modifications for her multiple health conditions.    Quillian Quince, MD

## 2023-05-24 NOTE — Patient Instructions (Addendum)
      Blood work was ordered.       Medications changes include :   None    A referral was ordered and someone will call you to schedule an appointment.     Return in about 6 months (around 11/25/2023) for hypertension.

## 2023-05-24 NOTE — Progress Notes (Unsigned)
 Subjective:    Patient ID: Heather [name], female    DOB: Oct 27, 1945, 78 y.o.   MRN: [id number]     HPI Heather [name] is here for follow up of her chronic medical problems.   Has had a couple of elevated BP readings. She does not monitor her BP at home regularly.    2 episodes of leg cramps - both times when she was driving.   It passed with walking and stretched her legs.  Has some leg cramps that are minor when she is getting up in the morning.  She used to have cramping a lot more in the past.  She has been drinking enough water.  She had taken her potassium that day.    She is exercising.  She did elliptical 30-60 min every day.  She also increased her seafood.  She lost 9 lbs.  This month she started chair yoga on top of that.    Medications and allergies reviewed with patient and updated if appropriate.  Current Outpatient Medications on File Prior to Visit  Medication Sig Dispense Refill   acetaminophen (TYLENOL) 500 MG tablet Take 1,000 mg by mouth 3 (three) times daily as needed for moderate pain.     Boswellia-Glucosamine-Vit D (OSTEO BI-FLEX ONE PER DAY PO) Take 1 tablet by mouth daily.     Calcium Carb-Cholecalciferol (CHEWABLE CALCIUM/D3 PO) Take 1 tablet by mouth daily.     cetirizine (ZYRTEC) 10 MG tablet Take 10 mg by mouth daily.     clopidogrel (PLAVIX) 75 MG tablet Take 1 tablet (75 mg total) by mouth daily. 90 tablet 3   Coenzyme Q10 (CO Q 10 PO) Take 400 mg by mouth.     Cyanocobalamin 1000 MCG CAPS Take 1 capsule by mouth daily.     DULoxetine (CYMBALTA) 20 MG capsule Take 1 capsule (20 mg total) by mouth daily. 90 capsule 0   Ferrous Sulfate (IRON) 325 (65 FE) MG TABS Take 1 tablet by mouth daily.     furosemide (LASIX) 20 MG tablet TAKE 1 TABLET TWICE A DAY 180 tablet 3   gabapentin (NEURONTIN) 100 MG capsule TAKE 3 CAPSULES BY MOUTH AT BEDTIME 270 capsule 3   KLOR-CON M20 20 MEQ tablet TAKE 1 TABLET DAILY 90 tablet 3   Multiple Vitamin (MULTIVITAMIN) tablet  Take 1 tablet by mouth daily.     naproxen sodium (ALEVE) 220 MG tablet Take 220 mg by mouth as needed.     Omega-3 Fatty Acids (FISH OIL PO) Take 2 g by mouth daily.     pantoprazole (PROTONIX) 20 MG tablet TAKE 1 TABLET TWICE A DAY, 30 MINUTES BEFORE FOOD 180 tablet 3   polyethylene glycol (MIRALAX / GLYCOLAX) 17 g packet Take 17 g by mouth every other day.     pyridoxine (B-6) 200 MG tablet Take 200 mg by mouth daily.     rosuvastatin (CRESTOR) 40 MG tablet TAKE 1 TABLET DAILY AT NIGHT 90 tablet 3   solifenacin (VESICARE) 10 MG tablet Take 1 tablet (10 mg total) by mouth daily. 90 tablet 3   topiramate (TOPAMAX) 25 MG tablet Take 3 tablets (75 mg total) by mouth at bedtime. 270 tablet 3   TURMERIC PO Take 538 mg by mouth daily.     Vitamin D, Ergocalciferol, (DRISDOL) 1.25 MG (50000 UNIT) CAPS capsule Take 1 capsule (50,000 Units total) by mouth every 7 (seven) days. 13 capsule 0   vitamin E 200 UNIT capsule Take 200  Units by mouth daily.     traMADol (ULTRAM) 50 MG tablet Take 1 tablet (50 mg total) by mouth 2 (two) times daily as needed. (Patient not taking: Reported on 05/25/2023) 30 tablet 2   No current facility-administered medications on file prior to visit.     Review of Systems  Constitutional:  Negative for fever.  HENT:  Negative for trouble swallowing.   Respiratory:  Negative for cough, shortness of breath and wheezing.   Cardiovascular:  Negative for chest pain, palpitations and leg swelling.  Gastrointestinal:  Negative for abdominal pain (occ mild discomfort).       No gerd  Endocrine: Positive for cold intolerance.  Musculoskeletal:  Positive for back pain (chronic - mild).  Neurological:  Positive for dizziness (occ with turning) and light-headedness (occ). Negative for headaches.  Psychiatric/Behavioral:  Negative for dysphoric mood and sleep disturbance. The patient is not nervous/anxious.        Objective:   Vitals:   05/25/23 1249  BP: 124/76  Pulse: 65   Temp: 98.5 F (36.9 C)  SpO2: 99%   BP Readings from Last 3 Encounters:  05/25/23 124/76  05/19/23 (!) 144/84  04/20/23 118/72   Wt Readings from Last 3 Encounters:  05/25/23 208 lb 6.4 oz (94.5 kg)  05/19/23 203 lb (92.1 kg)  04/20/23 209 lb (94.8 kg)   Body mass index is 35.77 kg/m.    Physical Exam Constitutional:      General: She is not in acute distress.    Appearance: Normal appearance.  HENT:     Head: Normocephalic and atraumatic.  Eyes:     Conjunctiva/sclera: Conjunctivae normal.  Cardiovascular:     Rate and Rhythm: Normal rate and regular rhythm.     Heart sounds: Normal heart sounds.  Pulmonary:     Effort: Pulmonary effort is normal. No respiratory distress.     Breath sounds: Normal breath sounds. No wheezing.  Musculoskeletal:     Cervical back: Neck supple.     Right lower leg: No edema.     Left lower leg: No edema.  Lymphadenopathy:     Cervical: No cervical adenopathy.  Skin:    General: Skin is warm and dry.     Findings: No rash.  Neurological:     Mental Status: She is alert. Mental status is at baseline.  Psychiatric:        Mood and Affect: Mood normal.        Behavior: Behavior normal.        Lab Results  Component Value Date   WBC 4.8 05/26/2022   HGB 12.0 05/26/2022   HCT 36.5 05/26/2022   PLT 246.0 05/26/2022   GLUCOSE 81 10/28/2022   CHOL 145 05/26/2022   TRIG 111.0 05/26/2022   HDL 62.50 05/26/2022   LDLCALC 60 05/26/2022   ALT 24 10/28/2022   AST 29 10/28/2022   NA 144 10/28/2022   K 4.1 10/28/2022   CL 105 10/28/2022   CREATININE 0.83 10/28/2022   BUN 17 10/28/2022   CO2 24 10/28/2022   TSH 0.62 05/26/2022   INR 1.1 04/26/2018   HGBA1C 5.9 (H) 10/28/2022   MICROALBUR <0.7 11/21/2021     Assessment & Plan:    See Problem List for Assessment and Plan of chronic medical problems.

## 2023-05-25 ENCOUNTER — Ambulatory Visit (INDEPENDENT_AMBULATORY_CARE_PROVIDER_SITE_OTHER): Payer: Medicare Other | Admitting: Internal Medicine

## 2023-05-25 ENCOUNTER — Other Ambulatory Visit: Payer: Self-pay | Admitting: Internal Medicine

## 2023-05-25 ENCOUNTER — Encounter: Payer: Self-pay | Admitting: Internal Medicine

## 2023-05-25 VITALS — BP 124/76 | HR 65 | Temp 98.5°F | Ht 64.0 in | Wt 208.4 lb

## 2023-05-25 DIAGNOSIS — Z9861 Coronary angioplasty status: Secondary | ICD-10-CM

## 2023-05-25 DIAGNOSIS — E1169 Type 2 diabetes mellitus with other specified complication: Secondary | ICD-10-CM | POA: Diagnosis not present

## 2023-05-25 DIAGNOSIS — G6281 Critical illness polyneuropathy: Secondary | ICD-10-CM

## 2023-05-25 DIAGNOSIS — E049 Nontoxic goiter, unspecified: Secondary | ICD-10-CM | POA: Diagnosis not present

## 2023-05-25 DIAGNOSIS — I2510 Atherosclerotic heart disease of native coronary artery without angina pectoris: Secondary | ICD-10-CM

## 2023-05-25 DIAGNOSIS — D509 Iron deficiency anemia, unspecified: Secondary | ICD-10-CM | POA: Diagnosis not present

## 2023-05-25 DIAGNOSIS — G43009 Migraine without aura, not intractable, without status migrainosus: Secondary | ICD-10-CM

## 2023-05-25 DIAGNOSIS — E785 Hyperlipidemia, unspecified: Secondary | ICD-10-CM | POA: Diagnosis not present

## 2023-05-25 DIAGNOSIS — K219 Gastro-esophageal reflux disease without esophagitis: Secondary | ICD-10-CM

## 2023-05-25 LAB — COMPREHENSIVE METABOLIC PANEL
ALT: 16 U/L (ref 0–35)
AST: 23 U/L (ref 0–37)
Albumin: 4 g/dL (ref 3.5–5.2)
Alkaline Phosphatase: 62 U/L (ref 39–117)
BUN: 14 mg/dL (ref 6–23)
CO2: 31 meq/L (ref 19–32)
Calcium: 8.9 mg/dL (ref 8.4–10.5)
Chloride: 106 meq/L (ref 96–112)
Creatinine, Ser: 0.81 mg/dL (ref 0.40–1.20)
GFR: 70.43 mL/min (ref >60.00)
Glucose, Bld: 85 mg/dL (ref 70–99)
Potassium: 3.7 meq/L (ref 3.5–5.1)
Sodium: 141 meq/L (ref 135–145)
Total Bilirubin: 0.3 mg/dL (ref 0.2–1.2)
Total Protein: 7.2 g/dL (ref 6.0–8.3)

## 2023-05-25 LAB — CBC WITH DIFFERENTIAL/PLATELET
Basophils Absolute: 0 10*3/uL (ref 0.0–0.1)
Basophils Relative: 0.6 % (ref 0.0–3.0)
Eosinophils Absolute: 0.1 10*3/uL (ref 0.0–0.7)
Eosinophils Relative: 1.8 % (ref 0.0–5.0)
HCT: 41.5 % (ref 36.0–46.0)
Hemoglobin: 13.6 g/dL (ref 12.0–15.0)
Lymphocytes Relative: 44.3 % (ref 12.0–46.0)
Lymphs Abs: 1.4 10*3/uL (ref 0.7–4.0)
MCHC: 32.7 g/dL (ref 30.0–36.0)
MCV: 97.9 fl (ref 78.0–100.0)
Monocytes Absolute: 0.3 10*3/uL (ref 0.1–1.0)
Monocytes Relative: 10.7 % (ref 3.0–12.0)
Neutro Abs: 1.3 10*3/uL — ABNORMAL LOW (ref 1.4–7.7)
Neutrophils Relative %: 42.6 % — ABNORMAL LOW (ref 43.0–77.0)
Platelets: 177 10*3/uL (ref 150.0–400.0)
RBC: 4.24 Mil/uL (ref 3.87–5.11)
RDW: 15.4 % (ref 11.5–15.5)
WBC: 3.1 10*3/uL — ABNORMAL LOW (ref 4.0–10.5)

## 2023-05-25 LAB — LIPID PANEL
Cholesterol: 185 mg/dL (ref 0–200)
HDL: 64.8 mg/dL (ref >39.00)
LDL Cholesterol: 106 mg/dL — ABNORMAL HIGH (ref 0–99)
NonHDL: 119.91
Total CHOL/HDL Ratio: 3
Triglycerides: 70 mg/dL (ref 0.0–149.0)
VLDL: 14 mg/dL (ref 0.0–40.0)

## 2023-05-25 LAB — MICROALBUMIN / CREATININE URINE RATIO
Creatinine,U: 63.9 mg/dL
Microalb Creat Ratio: UNDETERMINED mg/g (ref 0.0–30.0)
Microalb, Ur: <0.7 mg/dL

## 2023-05-25 LAB — HEMOGLOBIN A1C: Hgb A1c MFr Bld: 5.7 % (ref 4.6–6.5)

## 2023-05-25 LAB — FERRITIN: Ferritin: 153.9 ng/mL (ref 10.0–291.0)

## 2023-05-25 LAB — IBC PANEL
Iron: 71 ug/dL (ref 42–145)
Saturation Ratios: 31.1 % (ref 20.0–50.0)
TIBC: 228.2 ug/dL — ABNORMAL LOW (ref 250.0–450.0)
Transferrin: 163 mg/dL — ABNORMAL LOW (ref 212.0–360.0)

## 2023-05-25 MED ORDER — EZETIMIBE 10 MG PO TABS: 10.0000 mg | ORAL_TABLET | Freq: Every day | ORAL | 3 refills | Status: AC

## 2023-05-25 NOTE — Assessment & Plan Note (Signed)
Chronic Taking iron daily Iron panel, cbc

## 2023-05-25 NOTE — Assessment & Plan Note (Signed)
Chronic GERD controlled Continue pantoprazole 20 mg bid

## 2023-05-25 NOTE — Assessment & Plan Note (Signed)
Chronic Regular exercise and healthy diet encouraged Check lipid panel, CMP Continue Zetia 10 mg daily, Crestor 40 mg daily

## 2023-05-25 NOTE — Assessment & Plan Note (Signed)
Chronic Neuropathy controlled Continue gabapentin 300 mg nightly

## 2023-05-25 NOTE — Assessment & Plan Note (Signed)
 Chronic-stable.

## 2023-05-25 NOTE — Assessment & Plan Note (Signed)
 Chronic Following with neurology-Heather [name] Migraines controlled Continue Topamax 75 mg at bedtime

## 2023-05-25 NOTE — Assessment & Plan Note (Signed)
Chronic No current symptoms suggestive of angina Following with Dr. Ronelle Nigh On Plavix 75 mg daily, Zetia 10 mg daily and Crestor 40 mg daily Continue above medications

## 2023-05-25 NOTE — Assessment & Plan Note (Signed)
Chronic Lab Results  Component Value Date   HGBA1C 5.9 (H) 10/28/2022   sugars well controlled check a1c, urine microalbumin continue diet control stressed importance of regular exercise and diabetic diet

## 2023-05-27 ENCOUNTER — Ambulatory Visit: Payer: Medicare Other

## 2023-05-27 ENCOUNTER — Encounter: Payer: Self-pay | Admitting: Internal Medicine

## 2023-05-27 VITALS — Ht 64.0 in | Wt 208.0 lb

## 2023-05-27 DIAGNOSIS — Z0000 Encounter for general adult medical examination without abnormal findings: Secondary | ICD-10-CM

## 2023-05-27 NOTE — Progress Notes (Signed)
 Subjective:  Please attest and cosign this visit due to patients primary care provider not being in the office at the time the visit was completed.  (Pt of Dr Cheryll Cockayne)   Heather [name] is a 78 y.o. who presents for a Medicare Wellness preventive visit.  Visit Complete: Virtual I connected with  RAMA SORCI on 05/27/23 by a video and audio enabled telemedicine application and verified that I am speaking with the correct person using two identifiers.  Patient Location: Home  Provider Location: Office/Clinic  I discussed the limitations of evaluation and management by telemedicine. The patient expressed understanding and agreed to proceed.  Vital Signs: Because this visit was a virtual/telehealth visit, some criteria may be missing or patient reported. Any vitals not documented were not able to be obtained and vitals that have been documented are patient reported.  Persons Participating in Visit: Patient.  AWV Questionnaire: No: Patient Medicare AWV questionnaire was not completed prior to this visit.  Cardiac Risk Factors include: advanced age (>61men, >12 women);diabetes mellitus;dyslipidemia;obesity (BMI >30kg/m2)     Objective:    Today's Vitals   05/27/23 1425  Weight: 208 lb (94.3 kg)  Height: 5\' 4"  (1.626 m)   Body mass index is 35.7 kg/m.     05/27/2023    2:24 PM 08/12/2022   12:02 PM 05/26/2022    2:59 PM 03/07/2021    1:21 PM 11/27/2020   11:06 AM 12/07/2019   11:25 AM 11/27/2019   12:06 PM  Advanced Directives  Does Patient Have a Medical Advance Directive? Yes Yes Yes Yes Yes No No  Type of Estate agent of [place];Living will Healthcare Power of [place];Living will Healthcare Power of [place];Living will Healthcare Power of [place];Living will Healthcare Power of [place];Living will    Does patient want to make changes to medical advance directive?  No - Patient declined  No - Patient declined No - Patient declined    Copy of  Healthcare Power of Attorney in Chart? No - copy requested No - copy requested No - copy requested No - copy requested No - copy requested    Would patient like information on creating a medical advance directive?      No - Patient declined No - Patient declined    Current Medications (verified) Outpatient Encounter Medications as of 05/27/2023  Medication Sig   acetaminophen (TYLENOL) 500 MG tablet Take 1,000 mg by mouth 3 (three) times daily as needed for moderate pain.   Boswellia-Glucosamine-Vit D (OSTEO BI-FLEX ONE PER DAY PO) Take 1 tablet by mouth daily.   Calcium Carb-Cholecalciferol (CHEWABLE CALCIUM/D3 PO) Take 1 tablet by mouth daily.   cetirizine (ZYRTEC) 10 MG tablet Take 10 mg by mouth daily.   clopidogrel (PLAVIX) 75 MG tablet Take 1 tablet (75 mg total) by mouth daily.   Coenzyme Q10 (CO Q 10 PO) Take 400 mg by mouth.   Cyanocobalamin 1000 MCG CAPS Take 1 capsule by mouth daily.   DULoxetine (CYMBALTA) 20 MG capsule Take 1 capsule (20 mg total) by mouth daily.   ezetimibe (ZETIA) 10 MG tablet Take 1 tablet (10 mg total) by mouth daily.   Ferrous Sulfate (IRON) 325 (65 FE) MG TABS Take 1 tablet by mouth daily.   furosemide (LASIX) 20 MG tablet TAKE 1 TABLET TWICE A DAY   gabapentin (NEURONTIN) 100 MG capsule TAKE 3 CAPSULES BY MOUTH AT BEDTIME   KLOR-CON M20 20 MEQ tablet TAKE 1 TABLET DAILY   Multiple Vitamin (  MULTIVITAMIN) tablet Take 1 tablet by mouth daily.   naproxen sodium (ALEVE) 220 MG tablet Take 220 mg by mouth as needed.   Omega-3 Fatty Acids (FISH OIL PO) Take 2 g by mouth daily.   pantoprazole (PROTONIX) 20 MG tablet TAKE 1 TABLET TWICE A DAY, 30 MINUTES BEFORE FOOD   polyethylene glycol (MIRALAX / GLYCOLAX) 17 g packet Take 17 g by mouth every other day.   pyridoxine (B-6) 200 MG tablet Take 200 mg by mouth daily.   rosuvastatin (CRESTOR) 40 MG tablet TAKE 1 TABLET DAILY AT NIGHT   solifenacin (VESICARE) 10 MG tablet Take 1 tablet (10 mg total) by mouth daily.    topiramate (TOPAMAX) 25 MG tablet Take 3 tablets (75 mg total) by mouth at bedtime.   traMADol (ULTRAM) 50 MG tablet Take 1 tablet (50 mg total) by mouth 2 (two) times daily as needed.   TURMERIC PO Take 538 mg by mouth daily.   Vitamin D, Ergocalciferol, (DRISDOL) 1.25 MG (50000 UNIT) CAPS capsule Take 1 capsule (50,000 Units total) by mouth every 7 (seven) days.   vitamin E 200 UNIT capsule Take 200 Units by mouth daily.   No facility-administered encounter medications on file as of 05/27/2023.    Allergies (verified) Aspirin, Coconut (cocos nucifera), Lisinopril, Metoprolol, Other, Peanut-containing drug products, Prednisone, Strawberry extract, and Penicillins   History: Past Medical History:  Diagnosis Date   Abdominal hernia    Abnormal Pap smear    ALLERGIC RHINITIS 10/22/2006   Allergy    ANEMIA 12/18/2008   Arthritis    scoliosis moderate deg changes lumbar Xray 11/04/17    Asthma    ASYMPTOMATIC POSTMENOPAUSAL STATUS 11/22/2007   Back pain    CAD (coronary artery disease)    in LAD   CHF (congestive heart failure) (HCC)    Complication of anesthesia 2010   pt states she woke up during colonoscopy and was aware of "losing control of breathing" during mammoplasty   Degenerative arthritis    Diverticulosis    Dyslipidemia    Fibroid    Frequent headaches    GERD 07/20/2007   GOITER, MULTINODULAR 07/20/2007   Headache(784.0) 07/20/2007   HEARING LOSS 11/22/2007   Hiatal hernia    Hiatal hernia    large   History of chicken pox    Hx of colposcopy with cervical biopsy    HYPERCHOLESTEROLEMIA 01/13/2007   Hyperglycemia    HYPERTENSION 10/22/2006   Lung nodule    unchanged since 05/26/13    Migraines    NASH (nonalcoholic steatohepatitis)    Nocturnal hypoxemia 02/17/2013   Obesity    Obstructive sleep apnea    OSA on CPAP    per neurology   OSTEOARTHRITIS 10/22/2006   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   Pre-diabetes    Sedimentation rate  elevation    Sleep apnea    on cpap   Urine incontinence    UTI (urinary tract infection)    Past Surgical History:  Procedure Laterality Date   BREAST SURGERY  1984   Breast reduction b/l    CATARACT EXTRACTION, BILATERAL     DEXA  08/2005   DILATION AND CURETTAGE OF UTERUS     ELECTROCARDIOGRAM  10/15/2006   ESOPHAGOGASTRODUODENOSCOPY  12/08/2005   FOOT SURGERY     hammertoe and bunion 05/2017    Stress Cardiolite  10/21/2005   sweat gland removal     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   Procedure: Left TOTAL KNEE ARTHROPLASTY;  Surgeon: Kathryne Hitch, MD;  [facility];  Service: Orthopedics;  Laterality: Left;   UMBILICAL HERNIA REPAIR N/A 04/25/2022   Procedure: OPEN INCARCERATED HERNIA REPAIR UMBILICAL WITH APPENDECTOMY;  Surgeon: Quentin Ore, MD;  Location: [facility] OR;  Service: General;  Laterality: N/A;   WISDOM TOOTH EXTRACTION     Family History  Problem Relation Age of Onset   Asthma Mother    Depression Mother    Bipolar disorder Mother    Dementia Mother    Arthritis Mother    Breast cancer Mother        primary   Colon cancer Mother        mets from breast   Cancer Mother        colon   Hypertension Father    Migraines Father    Cancer Maternal Aunt        ?   Heart disease Maternal Grandmother    Cancer Maternal Grandfather        stomach ?   Sleep apnea Neg Hx    Esophageal cancer Neg Hx    Stomach cancer Neg Hx    Rectal cancer Neg Hx    Social History   Socioeconomic History   Marital status: Married    Spouse name: Darcel Bayley   Number of children: 2   Years of education: College   Highest education level: Bachelor's degree (e.g., BA, AB, BS)  Occupational History   Occupation: Retired  Tobacco Use   Smoking status: Former    Current packs/day: 0.00    Average packs/day: 2.0 packs/day for 20.0 years (40.0 ttl pk-yrs)    Types: Cigarettes    Start date: 03/03/1959    Quit date: 03/03/1979    Years since quitting: 44.2     Passive exposure: Past   Smokeless tobacco: Never  Vaping Use   Vaping status: Never Used  Substance and Sexual Activity   Alcohol use: Yes    Comment: rare - TWICE A YR   Drug use: No   Sexual activity: Yes    Birth control/protection: Surgical  Other Topics Concern   Not on file  Social History Narrative   Patient is married Darcel Bayley).   Right Handed   Drinks 1 cup caffeine   Social Drivers of Corporate investment banker Strain: Low Risk  (05/27/2023)   Overall Financial Resource Strain (CARDIA)    Difficulty of Paying Living Expenses: Not hard at all  Food Insecurity: No Food Insecurity (05/27/2023)   Hunger Vital Sign    Worried About Running Out of Food in the Last Year: Never true    Ran Out of Food in the Last Year: Never true  Transportation Needs: No Transportation Needs (05/27/2023)   PRAPARE - Administrator, Civil Service (Medical): No    Lack of Transportation (Non-Medical): No  Physical Activity: Sufficiently Active (05/27/2023)   Exercise Vital Sign    Days of Exercise per Week: 7 days    Minutes of Exercise per Session: 30 min  Recent Concern: Physical Activity - Insufficiently Active (05/24/2023)   Exercise Vital Sign    Days of Exercise per Week: 3 days    Minutes of Exercise per Session: 30 min  Stress: No Stress Concern Present (05/27/2023)   [employer] of Occupational Health - Occupational Stress Questionnaire    Feeling of Stress : Not at all  Social Connections: Socially Integrated (05/27/2023)   Social Connection and Isolation Panel [NHANES]  Frequency of Communication with Friends and Family: More than three times a week    Frequency of Social Gatherings with Friends and Family: More than three times a week    Attends Religious Services: More than 4 times per year    Active Member of [employer] or Organizations: Yes    Attends Engineer, structural: More than 4 times per year    Marital Status: Married    Tobacco  Counseling Counseling given: No    Clinical Intake:  Pre-visit preparation completed: Yes  Pain : No/denies pain     BMI - recorded: 35.7 Nutritional Status: BMI > 30  Obese Nutritional Risks: None Diabetes: No  Lab Results  Component Value Date   HGBA1C 5.7 05/25/2023   HGBA1C 5.9 (H) 10/28/2022   HGBA1C 6.2 05/26/2022     How often do you need to have someone help you when you read instructions, pamphlets, or other written materials from your doctor or pharmacy?: 1 - Never  Interpreter Needed?: No  Information entered by :: Hassell Halim, CMA   Activities of Daily Living     05/27/2023    2:28 PM  In your present state of health, do you have any difficulty performing the following activities:  Hearing? 0  Vision? 0  Difficulty concentrating or making decisions? 0  Walking or climbing stairs? 0  Dressing or bathing? 0  Doing errands, shopping? 0  Preparing Food and eating ? N  Using the Toilet? N  In the past six months, have you accidently leaked urine? Y  Comment wears depend daily  Do you have problems with loss of bowel control? N  Managing your Medications? N  Managing your Finances? N  Housekeeping or managing your Housekeeping? N    Patient Care Team: Pincus Sanes, MD as PCP - General (Internal Medicine) Laurey Morale, MD as PCP - Cardiology (Cardiology) York Spaniel, MD (Inactive) (Neurology) Haygood, Maris Berger, MD (Inactive) (Obstetrics and Gynecology) [place], Heather, OD (Optometry) Kalman Shan, MD as Consulting Physician (Pulmonary Disease) Casimer Lanius, MD as Referring Physician (Rheumatology) Laurey Morale, MD as Consulting Physician (Cardiology) Elinor Parkinson, DPM as Consulting Physician (Podiatry)  Indicate any recent Medical Services you may have received from other than [facility] providers in the past year (date may be approximate).     Assessment:   This is a routine wellness examination for Zanyah.  Hearing/Vision  screen Hearing Screening - Comments:: Denies hearing difficulties   Vision Screening - Comments:: Wears rx glasses - up to date with routine eye exams with Dr Heather [place]   Goals Addressed               This Visit's Progress     Patient Stated (pt-stated)        Patient stated that she plans to continue to exercise and lose weight (about 5-10lbs).       Depression Screen     05/27/2023    2:32 PM 05/25/2023    1:06 PM 05/26/2022    2:57 PM 04/23/2022   10:08 AM 10/29/2021    3:31 PM 10/24/2021    4:06 PM 07/02/2021    1:21 PM  PHQ 2/9 Scores  PHQ - 2 Score 0 0 1 0 0 1 1  PHQ- 9 Score 0 0         Fall Risk     05/27/2023    2:30 PM 05/25/2023   12:56 PM 05/26/2022    2:59 PM 05/26/2022  11:23 AM 04/23/2022   10:08 AM  Fall Risk   Falls in the past year? 0 0 0 0 0  Number falls in past yr: 0 0 0 0 0  Injury with Fall? 0 0 0 0 0  Risk for fall due to : No Fall Risks No Fall Risks No Fall Risks  No Fall Risks  Follow up Falls prevention discussed;Falls evaluation completed Falls evaluation completed Falls prevention discussed  Falls evaluation completed    MEDICARE RISK AT HOME:  Medicare Risk at Home Any stairs in or around the home?: Yes (uses elevator inside home) If so, are there any without handrails?: No Home free of loose throw rugs in walkways, pet beds, electrical cords, etc?: Yes Adequate lighting in your home to reduce risk of falls?: Yes Life alert?: No Use of a cane, walker or w/c?: Yes (cane, rollater) Grab bars in the bathroom?: No Shower chair or bench in shower?: Yes Elevated toilet seat or a handicapped toilet?: Yes  TIMED UP AND GO:  Was the test performed?  No  Cognitive Function: 6CIT completed    12/14/2022   11:13 AM 12/16/2021    1:46 PM 01/14/2021   11:37 AM 08/12/2020   10:21 AM 02/08/2020   10:14 AM  MMSE - Mini Mental State Exam  Orientation to time 5 5 4 5 5   Orientation to Place 5 5 5 5 5   Registration 3 3 3 3 3   Attention/  Calculation 5 5 4 5 5   Recall 3 3 3 3 3   Language- name 2 objects 2 2 2 2 2   Language- repeat 1 1 1 1 1   Language- follow 3 step command 3 3 3 3 3   Language- read & follow direction 1 1 1 1 1   Write a sentence 1 1 1 1 1   Copy design 1 1 1 1 1   Total score 30 30 28 30 30         05/27/2023    2:34 PM 05/26/2022    3:00 PM 11/24/2018   12:03 PM  6CIT Screen  What Year? 0 points 0 points 0 points  What month? 0 points 0 points 0 points  What time? 0 points 0 points 0 points  Count back from 20 0 points 0 points 0 points  Months in reverse 0 points 0 points 0 points  Repeat phrase 0 points 0 points 0 points  Total Score 0 points 0 points 0 points    Immunizations Immunization History  Administered Date(s) Administered   Fluad Quad(high Dose 65+) 12/13/2018, 11/08/2019, 01/02/2021, 11/21/2021   Fluad Trivalent(High Dose 65+) 11/25/2022   Influenza Split 12/25/2010, 12/28/2011   Influenza Whole 12/18/2008, 12/03/2009   Influenza, High Dose Seasonal PF 03/05/2016, 12/29/2016, 01/05/2018   Influenza,inj,Quad PF,6+ Mos 01/24/2013, 01/30/2014, 02/15/2015   PFIZER Comirnaty(Gray Top)Covid-19 Tri-Sucrose Vaccine 06/28/2020   PFIZER(Purple Top)SARS-COV-2 Vaccination 04/06/2019, 04/27/2019, 12/07/2019   Pneumococcal Conjugate-13 01/30/2014   Pneumococcal Polysaccharide-23 12/18/2008, 01/18/2019   Tdap 01/30/2014   Zoster, Live 01/30/2014    Screening Tests Health Maintenance  Topic Date Due   Zoster Vaccines- Shingrix (1 of 2) 10[birth year]22/1965   FOOT EXAM  09/26/2021   COVID-19 Vaccine (5 - 2024-25 season) 11/01/2022   OPHTHALMOLOGY EXAM  08/24/2023   HEMOGLOBIN A1C  11/25/2023   DTaP/Tdap/Td (2 - Td or Tdap) 01/31/2024   Diabetic kidney evaluation - eGFR measurement  05/24/2024   Diabetic kidney evaluation - Urine ACR  05/24/2024   Medicare Annual Wellness (AWV)  05/26/2024  Pneumonia Vaccine 59+ Years old  Completed   INFLUENZA VACCINE  Completed   DEXA SCAN  Completed    Hepatitis C Screening  Completed   HPV VACCINES  Aged Out   Colonoscopy  Discontinued    Health Maintenance  Health Maintenance Due  Topic Date Due   Zoster Vaccines- Shingrix (1 of 2) 10/22/1965   FOOT EXAM  09/26/2021   COVID-19 Vaccine (5 - 2024-25 season) 11/01/2022   Health Maintenance Items Addressed: 05/27/2023 Foot Exam status: Per pt completed 04/06/2023 and next appt on 07/06/2023 w/Dr Al Corpus.  Additional Screening:  Vision Screening: Recommended annual ophthalmology exams for early detection of glaucoma and other disorders of the eye.  Dental Screening: Recommended annual dental exams for proper oral hygiene  Community Resource Referral / Chronic Care Management: CRR required this visit?  No   CCM required this visit?  No     Plan:     I have personally reviewed and noted the following in the patient's chart:   Medical and social history Use of alcohol, tobacco or illicit drugs  Current medications and supplements including opioid prescriptions. Patient is not currently taking opioid prescriptions. Functional ability and status Nutritional status Physical activity Advanced directives List of other physicians Hospitalizations, surgeries, and ER visits in previous 12 months Vitals Screenings to include cognitive, depression, and falls Referrals and appointments  In addition, I have reviewed and discussed with patient certain preventive protocols, quality metrics, and best practice recommendations. A written personalized care plan for preventive services as well as general preventive health recommendations were provided to patient.     Darreld Mclean, CMA   05/27/2023   After Visit Summary: Due to Video visit, the patient will review information via MyChart.  Notes: Nothing significant to report at this time.

## 2023-05-27 NOTE — Patient Instructions (Addendum)
 Heather [name] , Thank you for taking time to come for your Medicare Wellness Visit. I appreciate your ongoing commitment to your health goals. Please review the following plan we discussed and let me know if I can assist you in the future.   Referrals/Orders/Follow-Ups/Clinician Recommendations: Aim for 30 minutes of exercise or brisk walking, 6-8 glasses of water, and 5 servings of fruits and vegetables each day.   This is a list of the screening recommended for you and due dates:  Health Maintenance  Topic Date Due   Zoster (Shingles) Vaccine (1 of 2) 10/22/1965   Complete foot exam   09/26/2021   COVID-19 Vaccine (5 - 2024-25 season) 11/01/2022   Eye exam for diabetics  08/24/2023   Hemoglobin A1C  11/25/2023   DTaP/Tdap/Td vaccine (2 - Td or Tdap) 01/31/2024   Yearly kidney function blood test for diabetes  05/24/2024   Yearly kidney health urinalysis for diabetes  05/24/2024   Medicare Annual Wellness Visit  05/26/2024   Pneumonia Vaccine  Completed   Flu Shot  Completed   DEXA scan (bone density measurement)  Completed   Hepatitis C Screening  Completed   HPV Vaccine  Aged Out   Colon Cancer Screening  Discontinued    Advanced directives: (Copy Requested) Please bring a copy of your health care power of attorney and living will to the office to be added to your chart at your convenience. You can mail to [facility] [address] [place], [place] [address] or email to [email or web address]  Next Medicare Annual Wellness Visit scheduled for next year: Yes

## 2023-05-30 ENCOUNTER — Encounter: Payer: Self-pay | Admitting: Urology

## 2023-05-31 ENCOUNTER — Other Ambulatory Visit: Payer: Self-pay | Admitting: Urology

## 2023-05-31 DIAGNOSIS — N3941 Urge incontinence: Secondary | ICD-10-CM

## 2023-05-31 MED ORDER — SOLIFENACIN SUCCINATE 10 MG PO TABS: 10.0000 mg | ORAL_TABLET | Freq: Every day | ORAL | 3 refills | Status: AC

## 2023-06-01 ENCOUNTER — Telehealth (HOSPITAL_COMMUNITY): Payer: Self-pay

## 2023-06-01 NOTE — Telephone Encounter (Signed)
 Called to confirm/remind patient of their appointment at the [facility] on 06/02/23.   Appointment:   [] Confirmed  [x] Left mess   [] No answer/No voice mail  [] Phone not in service

## 2023-06-02 ENCOUNTER — Ambulatory Visit (HOSPITAL_COMMUNITY)
Admission: RE | Admit: 2023-06-02 | Discharge: 2023-06-02 | Disposition: A | Discharge status: Home / Self Care | Source: Ambulatory Visit | Attending: Family Medicine | Admitting: Family Medicine

## 2023-06-02 ENCOUNTER — Encounter (HOSPITAL_COMMUNITY): Payer: Self-pay

## 2023-06-02 VITALS — BP 128/84 | HR 61 | Wt 207.4 lb

## 2023-06-02 DIAGNOSIS — G4733 Obstructive sleep apnea (adult) (pediatric): Secondary | ICD-10-CM | POA: Diagnosis not present

## 2023-06-02 DIAGNOSIS — I10 Essential (primary) hypertension: Secondary | ICD-10-CM | POA: Diagnosis not present

## 2023-06-02 DIAGNOSIS — Z79899 Other long term (current) drug therapy: Secondary | ICD-10-CM | POA: Insufficient documentation

## 2023-06-02 DIAGNOSIS — Z6835 Body mass index (BMI) 35.0-35.9, adult: Secondary | ICD-10-CM | POA: Diagnosis not present

## 2023-06-02 DIAGNOSIS — E669 Obesity, unspecified: Secondary | ICD-10-CM | POA: Diagnosis not present

## 2023-06-02 DIAGNOSIS — E66812 Obesity, class 2: Secondary | ICD-10-CM | POA: Diagnosis not present

## 2023-06-02 DIAGNOSIS — E785 Hyperlipidemia, unspecified: Secondary | ICD-10-CM | POA: Insufficient documentation

## 2023-06-02 DIAGNOSIS — E119 Type 2 diabetes mellitus without complications: Secondary | ICD-10-CM | POA: Insufficient documentation

## 2023-06-02 DIAGNOSIS — Z886 Allergy status to analgesic agent status: Secondary | ICD-10-CM | POA: Diagnosis not present

## 2023-06-02 DIAGNOSIS — M25562 Pain in left knee: Secondary | ICD-10-CM | POA: Insufficient documentation

## 2023-06-02 DIAGNOSIS — M25561 Pain in right knee: Secondary | ICD-10-CM | POA: Diagnosis not present

## 2023-06-02 DIAGNOSIS — E782 Mixed hyperlipidemia: Secondary | ICD-10-CM

## 2023-06-02 DIAGNOSIS — Z7902 Long term (current) use of antithrombotics/antiplatelets: Secondary | ICD-10-CM | POA: Insufficient documentation

## 2023-06-02 DIAGNOSIS — M1990 Unspecified osteoarthritis, unspecified site: Secondary | ICD-10-CM | POA: Insufficient documentation

## 2023-06-02 DIAGNOSIS — Z87891 Personal history of nicotine dependence: Secondary | ICD-10-CM | POA: Diagnosis not present

## 2023-06-02 DIAGNOSIS — I2510 Atherosclerotic heart disease of native coronary artery without angina pectoris: Secondary | ICD-10-CM | POA: Insufficient documentation

## 2023-06-02 LAB — LIPID PANEL
Cholesterol: 142 mg/dL (ref 0–200)
HDL: 69 mg/dL (ref >40)
LDL Cholesterol: 59 mg/dL (ref 0–99)
Total CHOL/HDL Ratio: 2.1 ratio
Triglycerides: 69 mg/dL (ref <150)
VLDL: 14 mg/dL (ref 0–40)

## 2023-06-02 NOTE — Patient Instructions (Addendum)
 Good to see you today!  Labs done today, your results will be available in MyChart, we will contact you for abnormal readings.  Your physician recommends that you schedule a follow-up appointment in:1 year(April 2026) Call office in January 2026 to schedule an appointment  Take b/p daily and record notify Heather [name] if systolic b/p greater than 120/80  If you have any questions or concerns before your next appointment please send Heather [name] a message through [place] or call our office at [phone].    TO LEAVE A MESSAGE FOR THE NURSE SELECT OPTION 2, PLEASE LEAVE A MESSAGE INCLUDING: YOUR NAME DATE OF BIRTH CALL BACK NUMBER REASON FOR CALL**this is important as we prioritize the call backs  YOU WILL RECEIVE A CALL BACK THE SAME DAY AS LONG AS YOU CALL BEFORE 4:00 PM  At the [facility], you and your health needs are our priority. As part of our continuing mission to provide you with exceptional heart care, we have created designated Provider Care Teams. These Care Teams include your primary Cardiologist (physician) and Advanced Practice Providers (APPs- Physician Assistants and Nurse Practitioners) who all work together to provide you with the care you need, when you need it.   You may see any of the following providers on your designated Care Team at your next follow up: Dr Arvilla Meres Dr Marca Ancona Dr. Dorthula Nettles Dr. Clearnce Hasten Amy Filbert Schilder, NP Robbie Lis, [place] [facility] [place], [place] Brynda Peon, NP [place] Lee, NP Clarisa Kindred, NP Karle Plumber, PharmD Enos Fling, PharmD   Please be sure to bring in all your medications bottles to every appointment.    Thank you for choosing [facility]

## 2023-06-02 NOTE — Progress Notes (Signed)
 Patient ID: Heather [name], female   DOB: Sep 26, 1945, 78 y.o.   MRN: [id number] PCP: Pincus Sanes, MD Cardiology: Dr. Shirlee Latch  78 y.o.with CAD by coronary calcium on CT chest, OSA on Bipap, type II diabetes, and HTN presents for followup of CAD.  Her husband Adrean Findlay) is a patient of mine as well.  She had a workup for exertional dyspnea in 6/14 by Dr Marchelle Gearing.  PFTs were restrictive with normal DLCO, suggesting abnormality was from body habitus.  CT chest showed coronary calcification.  She had an echo showed normal EF and a Lexiscan Cardiolite in 8/14 that was normal.    Echo in 11/19 showed EF 60-65%, normal RV, PASP 43 mmHg.   Last seen 04/2020.  Today she returns for HF follow up with her husband, Darcel Bayley. We have not seen her in 3 years. Overall feeling fine. Stays active doing elliptical & chair yoga, no SOB with this. Main issues is knee OA. Working with [employer]. Denies palpitations, abnormal bleeding, CP, dizziness, edema, or PND/Orthopnea. Appetite ok. No fever or chills.Taking all medications. Uses CPAP. She cannot take ASA due to hives, so is taking Plavix. BP have been elevated at recent MD appts, does not check at home.  ECG (personally reviewed): SB 58 bpm  Labs (3/25): K 3.7, creatinine 0.81, LDL 106  PMH: 1. Obese 2. OSA: On oxygen and Bipap at night.  3. Migraines 4. GERD with large hiatal hernia 5. Type II diabetes. 6. PFTs in 6/14 with evidence for restriction but normal DLCO.  Suspect related to body habitus.  7. CAD: Coronary calcium on CT chest in 6/14.  Lexiscan Cardiolite in 8/14 with EF 51%, no ischemia or infarction.  Echo (8/14) with EF 60%, mild LVH.  - Echo (11/19): EF 60-65%, normal RV, PASP 43 mmHg.  8. Hyperlipidemia 9. HTN 10. Uterine fibroids 11. Osteoarthritis 12. Aspirin allergy: Hives.   SH: Married, quit smoking in 1980s, 2 children  FH: No premature CAD.  ROS: All systems reviewed and negative except as per HPI.    Current Outpatient Medications  Medication Sig Dispense Refill   acetaminophen (TYLENOL) 500 MG tablet Take 1,000 mg by mouth 3 (three) times daily as needed for moderate pain.     Boswellia-Glucosamine-Vit D (OSTEO BI-FLEX ONE PER DAY PO) Take 1 tablet by mouth daily.     Calcium Carb-Cholecalciferol (CHEWABLE CALCIUM/D3 PO) Take 1 tablet by mouth daily.     cetirizine (ZYRTEC) 10 MG tablet Take 10 mg by mouth daily.     clopidogrel (PLAVIX) 75 MG tablet Take 1 tablet (75 mg total) by mouth daily. 90 tablet 3   Coenzyme Q10 (CO Q 10 PO) Take 400 mg by mouth daily.     Cyanocobalamin 1000 MCG CAPS Take 1 capsule by mouth daily.     DULoxetine (CYMBALTA) 20 MG capsule Take 1 capsule (20 mg total) by mouth daily. 90 capsule 0   ezetimibe (ZETIA) 10 MG tablet Take 1 tablet (10 mg total) by mouth daily. 90 tablet 3   Ferrous Sulfate (IRON) 325 (65 FE) MG TABS Take 1 tablet by mouth daily.     furosemide (LASIX) 20 MG tablet TAKE 1 TABLET TWICE A DAY (Patient taking differently: Take 20 mg by mouth daily.) 180 tablet 3   gabapentin (NEURONTIN) 100 MG capsule TAKE 3 CAPSULES AT BEDTIME 270 capsule 3   KLOR-CON M20 20 MEQ tablet TAKE 1 TABLET DAILY 90 tablet 3   Multiple Vitamin (MULTIVITAMIN)  tablet Take 1 tablet by mouth daily.     naproxen sodium (ALEVE) 220 MG tablet Take 220 mg by mouth as needed.     Omega-3 Fatty Acids (FISH OIL PO) Take 2 g by mouth daily.     pantoprazole (PROTONIX) 20 MG tablet TAKE 1 TABLET TWICE A DAY, 30 MINUTES BEFORE FOOD 180 tablet 3   polyethylene glycol (MIRALAX / GLYCOLAX) 17 g packet Take 17 g by mouth every other day.     pyridoxine (B-6) 200 MG tablet Take 200 mg by mouth daily.     rosuvastatin (CRESTOR) 40 MG tablet TAKE 1 TABLET DAILY AT NIGHT 90 tablet 3   solifenacin (VESICARE) 10 MG tablet Take 1 tablet (10 mg total) by mouth daily. 90 tablet 3   topiramate (TOPAMAX) 25 MG tablet Take 3 tablets (75 mg total) by mouth at bedtime. 270 tablet 3    traMADol (ULTRAM) 50 MG tablet Take 1 tablet (50 mg total) by mouth 2 (two) times daily as needed. 30 tablet 2   TURMERIC PO Take 538 mg by mouth daily.     Vitamin D, Ergocalciferol, (DRISDOL) 1.25 MG (50000 UNIT) CAPS capsule Take 1 capsule (50,000 Units total) by mouth every 7 (seven) days. 13 capsule 0   vitamin E 200 UNIT capsule Take 200 Units by mouth daily.     No current facility-administered medications for this encounter.   Wt Readings from Last 3 Encounters:  06/02/23 94.1 kg (207 lb 6.4 oz)  05/27/23 94.3 kg (208 lb)  05/25/23 94.5 kg (208 lb 6.4 oz)   BP 128/84   Pulse 61   Wt 94.1 kg (207 lb 6.4 oz)   SpO2 97%   BMI 35.60 kg/m   Physical Exam General:  NAD. No resp difficulty, walked into clinic HEENT: Normal Neck: Supple. No JVD. Cor: Regular rate & rhythm. No rubs, gallops or murmurs. Lungs: Clear Abdomen: Soft, nontender, nondistended.  Extremities: No cyanosis, clubbing, rash, edema Neuro: Alert & oriented x 3, moves all 4 extremities w/o difficulty. Affect pleasant.  Assessment/Plan: 1. CAD: Patient has coronary disease based on calcium seen on chest CT in the coronaries in 6/14.  She had a Lexiscan Cardiolite in 8/14 that was normal.  Currently, she is really not limited by dyspnea and has no chest pain.  Main problem is bilateral knee pain with ambulation.  She follows with sports medicine for her OA. - Continue Plavix 75 daily (ASA allergy).  - Continue statin. 2. Hyperlipidemia: Goal LDL < 70. LDL 106 on labs 05/25/23.  - Continue Crestor 40 mg daily + Zetia 10 mg daily. - Lipids a year ago were well-controlled. She has been complaint with her statin. Repeat lipids today. If LDL > 70, will refer to Lipid Clinic for [id number] 3. HTN: BP is controlled today.  - I asked her to check BP at home and log. Notifiy clinic if BP > 120/80. She was previously on losartan but stopped years ago.  4. Obesity: Body mass index is 35.6 kg/m. - Continue efforts at weight  loss.    - We discussed GLP1R but she is not interested.  Follow up 1 year with D. Shirlee Latch.  [facility] FNP-BC 06/02/2023

## 2023-06-12 ENCOUNTER — Encounter (HOSPITAL_COMMUNITY): Payer: Self-pay | Admitting: Emergency Medicine

## 2023-06-12 ENCOUNTER — Encounter (HOSPITAL_COMMUNITY): Payer: Self-pay

## 2023-06-12 ENCOUNTER — Emergency Department (HOSPITAL_COMMUNITY)
Admission: EM | Admit: 2023-06-12 | Discharge: 2023-06-12 | Disposition: A | Discharge status: Home / Self Care | Attending: Emergency Medicine | Admitting: Emergency Medicine

## 2023-06-12 ENCOUNTER — Other Ambulatory Visit: Payer: Self-pay

## 2023-06-12 ENCOUNTER — Ambulatory Visit (HOSPITAL_COMMUNITY): Admission: EM | Admit: 2023-06-12 | Discharge: 2023-06-12 | Disposition: A | Discharge status: Home / Self Care

## 2023-06-12 ENCOUNTER — Emergency Department (HOSPITAL_COMMUNITY)

## 2023-06-12 DIAGNOSIS — Z7902 Long term (current) use of antithrombotics/antiplatelets: Secondary | ICD-10-CM | POA: Diagnosis not present

## 2023-06-12 DIAGNOSIS — R600 Localized edema: Secondary | ICD-10-CM | POA: Insufficient documentation

## 2023-06-12 DIAGNOSIS — R059 Cough, unspecified: Secondary | ICD-10-CM | POA: Diagnosis not present

## 2023-06-12 DIAGNOSIS — R0789 Other chest pain: Secondary | ICD-10-CM | POA: Diagnosis not present

## 2023-06-12 DIAGNOSIS — M542 Cervicalgia: Secondary | ICD-10-CM

## 2023-06-12 DIAGNOSIS — H748X2 Other specified disorders of left middle ear and mastoid: Secondary | ICD-10-CM | POA: Insufficient documentation

## 2023-06-12 DIAGNOSIS — D72819 Decreased white blood cell count, unspecified: Secondary | ICD-10-CM | POA: Insufficient documentation

## 2023-06-12 DIAGNOSIS — R519 Headache, unspecified: Secondary | ICD-10-CM

## 2023-06-12 DIAGNOSIS — R079 Chest pain, unspecified: Secondary | ICD-10-CM

## 2023-06-12 DIAGNOSIS — K449 Diaphragmatic hernia without obstruction or gangrene: Secondary | ICD-10-CM | POA: Diagnosis not present

## 2023-06-12 DIAGNOSIS — Z91010 Allergy to peanuts: Secondary | ICD-10-CM | POA: Diagnosis not present

## 2023-06-12 LAB — BASIC METABOLIC PANEL WITH GFR
Anion gap: 11 (ref 5–15)
BUN: 13 mg/dL (ref 8–23)
CO2: 25 mmol/L (ref 22–32)
Calcium: 8.8 mg/dL — ABNORMAL LOW (ref 8.9–10.3)
Chloride: 102 mmol/L (ref 98–111)
Creatinine, Ser: 0.76 mg/dL (ref 0.44–1.00)
GFR, Estimated: >60 mL/min (ref >60)
Glucose, Bld: 85 mg/dL (ref 70–99)
Potassium: 3.4 mmol/L — ABNORMAL LOW (ref 3.5–5.1)
Sodium: 138 mmol/L (ref 135–145)

## 2023-06-12 LAB — CBC WITH DIFFERENTIAL/PLATELET
Abs Immature Granulocytes: 0.01 10*3/uL (ref 0.00–0.07)
Basophils Absolute: 0 10*3/uL (ref 0.0–0.1)
Basophils Relative: 1 %
Eosinophils Absolute: 0 10*3/uL (ref 0.0–0.5)
Eosinophils Relative: 0 %
HCT: 39.5 % (ref 36.0–46.0)
Hemoglobin: 12.6 g/dL (ref 12.0–15.0)
Immature Granulocytes: 0 %
Lymphocytes Relative: 25 %
Lymphs Abs: 0.9 10*3/uL (ref 0.7–4.0)
MCH: 31.7 pg (ref 26.0–34.0)
MCHC: 31.9 g/dL (ref 30.0–36.0)
MCV: 99.2 fL (ref 80.0–100.0)
Monocytes Absolute: 0.6 10*3/uL (ref 0.1–1.0)
Monocytes Relative: 17 %
Neutro Abs: 1.9 10*3/uL (ref 1.7–7.7)
Neutrophils Relative %: 57 %
Platelets: 151 10*3/uL (ref 150–400)
RBC: 3.98 MIL/uL (ref 3.87–5.11)
RDW: 14.6 % (ref 11.5–15.5)
WBC: 3.4 10*3/uL — ABNORMAL LOW (ref 4.0–10.5)
nRBC: 0 % (ref 0.0–0.2)

## 2023-06-12 LAB — TROPONIN I (HIGH SENSITIVITY): Troponin I (High Sensitivity): 4 ng/L (ref <18)

## 2023-06-12 NOTE — Discharge Instructions (Signed)
 The blood work today looks good with a normal kidney function, red blood cell count and heart marker.  No concerning findings on your EKG.  Your x-ray does show a very large hiatal hernia which may be contributing to some of your discomfort but no other findings.  Also some of your symptoms may be related to allergies.  You can continue your current medications and you can take Tylenol as needed for pain.  Make sure you eat small frequent meals.  If you start having fever, shortness of breath, persistent pain, vomiting or other concerns return to the emergency room

## 2023-06-12 NOTE — ED Triage Notes (Signed)
 Pt c.o intermittent headache and central chest pain for the past several days, states the pain resolves when she's coughs. Pt is not having any chest pain at this time. Pt sent from UC due to abnormal EKG.

## 2023-06-12 NOTE — ED Triage Notes (Signed)
 Pt reports headache and central chest pain that have been intermittent over past few days. Reports "when I cough it releases itself". Denies pain at this time. Today pain lasted ""not very long, yesterday was intermittently during the day". Didn't take any medications for symptoms.

## 2023-06-12 NOTE — ED Notes (Signed)
 Patient is being discharged from the Urgent Care and sent to the Emergency Department via POV with family. Per Cristal Don, NP, patient is in need of higher level of care due to chest pain. Patient is aware and verbalizes understanding of plan of care.  Vitals:   06/12/23 1052  BP: (P) 139/63  Pulse: (P) 66  Temp: (P) 97.7 F (36.5 C)  SpO2: (P) 100%

## 2023-06-12 NOTE — ED Provider Notes (Signed)
 [facility]  Note:  This document was prepared using Dragon voice recognition software and may include unintentional dictation errors.  MRN: [id number] DOB: March 15, 1945  Subjective:   Heather [name] is a 78 y.o. female presenting for chest pain, headache, right neck and shoulder pain since yesterday.  Patient reports that when she coughs the chest pain relieves slightly but comes back.  Patient denies having any current chest pain.  Patient reports several episodes of chest pain, headache and right neck pain yesterday.  Patient denies taking any over-the-counter medication to treat symptoms.  No shortness of breath, weakness, dizziness, fatigue, nausea or vomiting, back pain, diarrhea.  No current facility-administered medications for this encounter.  Current Outpatient Medications:    VEVYE 0.1 % SOLN, Apply 1 drop to eye 2 (two) times daily., Disp: , Rfl:    acetaminophen (TYLENOL) 500 MG tablet, Take 1,000 mg by mouth 3 (three) times daily as needed for moderate pain., Disp: , Rfl:    Boswellia-Glucosamine-Vit D (OSTEO BI-FLEX ONE PER DAY PO), Take 1 tablet by mouth daily., Disp: , Rfl:    Calcium Carb-Cholecalciferol (CHEWABLE CALCIUM/D3 PO), Take 1 tablet by mouth daily., Disp: , Rfl:    cetirizine (ZYRTEC) 10 MG tablet, Take 10 mg by mouth daily., Disp: , Rfl:    clopidogrel (PLAVIX) 75 MG tablet, Take 1 tablet (75 mg total) by mouth daily., Disp: 90 tablet, Rfl: 3   Coenzyme Q10 (CO Q 10 PO), Take 400 mg by mouth daily., Disp: , Rfl:    Cyanocobalamin 1000 MCG CAPS, Take 1 capsule by mouth daily., Disp: , Rfl:    DULoxetine (CYMBALTA) 20 MG capsule, Take 1 capsule (20 mg total) by mouth daily., Disp: 90 capsule, Rfl: 0   ezetimibe (ZETIA) 10 MG tablet, Take 1 tablet (10 mg total) by mouth daily., Disp: 90 tablet, Rfl: 3   Ferrous Sulfate (IRON) 325 (65 FE) MG TABS, Take 1 tablet by mouth daily., Disp: , Rfl:    furosemide (LASIX) 20 MG tablet, TAKE 1 TABLET TWICE A DAY  (Patient taking differently: Take 20 mg by mouth daily.), Disp: 180 tablet, Rfl: 3   gabapentin (NEURONTIN) 100 MG capsule, TAKE 3 CAPSULES AT BEDTIME, Disp: 270 capsule, Rfl: 3   KLOR-CON M20 20 MEQ tablet, TAKE 1 TABLET DAILY, Disp: 90 tablet, Rfl: 3   Multiple Vitamin (MULTIVITAMIN) tablet, Take 1 tablet by mouth daily., Disp: , Rfl:    naproxen sodium (ALEVE) 220 MG tablet, Take 220 mg by mouth as needed., Disp: , Rfl:    Omega-3 Fatty Acids (FISH OIL PO), Take 2 g by mouth daily., Disp: , Rfl:    pantoprazole (PROTONIX) 20 MG tablet, TAKE 1 TABLET TWICE A DAY, 30 MINUTES BEFORE FOOD, Disp: 180 tablet, Rfl: 3   polyethylene glycol (MIRALAX / GLYCOLAX) 17 g packet, Take 17 g by mouth every other day., Disp: , Rfl:    pyridoxine (B-6) 200 MG tablet, Take 200 mg by mouth daily., Disp: , Rfl:    rosuvastatin (CRESTOR) 40 MG tablet, TAKE 1 TABLET DAILY AT NIGHT, Disp: 90 tablet, Rfl: 3   solifenacin (VESICARE) 10 MG tablet, Take 1 tablet (10 mg total) by mouth daily., Disp: 90 tablet, Rfl: 3   topiramate (TOPAMAX) 25 MG tablet, Take 3 tablets (75 mg total) by mouth at bedtime., Disp: 270 tablet, Rfl: 3   traMADol (ULTRAM) 50 MG tablet, Take 1 tablet (50 mg total) by mouth 2 (two) times daily as needed., Disp: 30 tablet, Rfl:  2   TURMERIC PO, Take 538 mg by mouth daily., Disp: , Rfl:    Vitamin D, Ergocalciferol, (DRISDOL) 1.25 MG (50000 UNIT) CAPS capsule, Take 1 capsule (50,000 Units total) by mouth every 7 (seven) days., Disp: 13 capsule, Rfl: 0   vitamin E 200 UNIT capsule, Take 200 Units by mouth daily., Disp: , Rfl:    Allergies  Allergen Reactions   Aspirin Hives   Coconut (Cocos Nucifera) Hives   Lisinopril Cough   Metoprolol Itching   Other     Latex paint, the smell causes hives. Not allergic to latex gloves   General anesthesia - has had issues in past where anesthesia was not properly administered and caused side effects   Peanut-Containing Drug Products Hives, Itching and Other  (See Comments)   Prednisone     Unknown reaction    Strawberry Extract Hives   Penicillins Rash    Past Medical History:  Diagnosis Date   Abdominal hernia    Abnormal Pap smear    ALLERGIC RHINITIS 10/22/2006   Allergy    ANEMIA 12/18/2008   Arthritis    scoliosis moderate deg changes lumbar Xray 11/04/17    Asthma    ASYMPTOMATIC POSTMENOPAUSAL STATUS 11/22/2007   Back pain    CAD (coronary artery disease)    in LAD   CHF (congestive heart failure) (HCC)    Complication of anesthesia 2010   pt states she woke up during colonoscopy and was aware of "losing control of breathing" during mammoplasty   Degenerative arthritis    Diverticulosis    Dyslipidemia    Fibroid    Frequent headaches    GERD 07/20/2007   GOITER, MULTINODULAR 07/20/2007   Headache(784.0) 07/20/2007   HEARING LOSS 11/22/2007   Hiatal hernia    Hiatal hernia    large   History of chicken pox    Hx of colposcopy with cervical biopsy    HYPERCHOLESTEROLEMIA 01/13/2007   Hyperglycemia    HYPERTENSION 10/22/2006   Lung nodule    unchanged since 05/26/13    Migraines    NASH (nonalcoholic steatohepatitis)    Nocturnal hypoxemia 02/17/2013   Obesity    Obstructive sleep apnea    OSA on CPAP    per neurology   OSTEOARTHRITIS 10/22/2006   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   Pre-diabetes    Sedimentation rate elevation    Sleep apnea    on cpap   Urine incontinence    UTI (urinary tract infection)      Past Surgical History:  Procedure Laterality Date   BREAST SURGERY  1984   Breast reduction b/l    CATARACT EXTRACTION, BILATERAL     DEXA  08/2005   DILATION AND CURETTAGE OF UTERUS     ELECTROCARDIOGRAM  10/15/2006   ESOPHAGOGASTRODUODENOSCOPY  12/08/2005   FOOT SURGERY     hammertoe and bunion 05/2017    Stress Cardiolite  10/21/2005   sweat gland removal     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   Procedure: Left TOTAL KNEE ARTHROPLASTY;  Surgeon: Arnie Lao, MD;  Location: [facility] OR;  Service: Orthopedics;  Laterality: Left;   UMBILICAL HERNIA REPAIR N/A 04/25/2022   Procedure: OPEN INCARCERATED HERNIA REPAIR UMBILICAL WITH APPENDECTOMY;  Surgeon: Junie Olds, MD;  Location: [facility] OR;  Service: General;  Laterality: N/A;   WISDOM TOOTH EXTRACTION      Family History  Problem Relation Age of Onset   Asthma Mother    Depression  Mother    Bipolar disorder Mother    Dementia Mother    Arthritis Mother    Breast cancer Mother        primary   Colon cancer Mother        mets from breast   Cancer Mother        colon   Hypertension Father    Migraines Father    Cancer Maternal Aunt        ?   Heart disease Maternal Grandmother    Cancer Maternal Grandfather        stomach ?   Sleep apnea Neg Hx    Esophageal cancer Neg Hx    Stomach cancer Neg Hx    Rectal cancer Neg Hx     Social History   Tobacco Use   Smoking status: Former    Current packs/day: 0.00    Average packs/day: 2.0 packs/day for 20.0 years (40.0 ttl pk-yrs)    Types: Cigarettes    Start date: 03/03/1959    Quit date: 03/03/1979    Years since quitting: 44.3    Passive exposure: Past   Smokeless tobacco: Never  Vaping Use   Vaping status: Never Used  Substance Use Topics   Alcohol use: Yes    Comment: rare - TWICE A YR   Drug use: No    ROS Refer to HPI for ROS details.  Objective:   Vitals: There were no vitals taken for this visit.  Physical Exam Vitals and nursing note reviewed.  Constitutional:      General: She is not in acute distress.    Appearance: She is well-developed. She is not ill-appearing, toxic-appearing or diaphoretic.  HENT:     Head: Normocephalic and atraumatic.  Cardiovascular:     Rate and Rhythm: Bradycardia present. Rhythm regularly irregular.     Heart sounds: No murmur heard. Pulmonary:     Effort: Pulmonary effort is normal. No respiratory distress.     Breath sounds: Normal breath sounds. No stridor. No wheezing,  rhonchi or rales.  Chest:     Chest wall: No tenderness.  Musculoskeletal:     Cervical back: Neck supple. Tenderness present. No rigidity.  Skin:    General: Skin is warm and dry.  Neurological:     General: No focal deficit present.     Mental Status: She is alert and oriented to person, place, and time.  Psychiatric:        Mood and Affect: Mood normal.     Procedures  No results found. However, due to the size of the patient record, not all encounters were searched. Please check Results Review for a complete set of results.  Assessment and Plan :   -EKG performed in UC during triage shows normal sinus rhythm with nonspecific T wave abnormality, slightly prolonged QT, ventricular rate of 65 bpm, no STEMI. -Based on current symptoms and current risk factors for cardiac event follow-up in emergency department immediately for further evaluation and management is recommended. -Please go across the street to the ER as soon as you leave urgent care for further testing and treatment. -Continue to monitor symptoms for any change in severity if there is any escalation of current symptoms or development of new symptoms follow-up in ER for further evaluation and management.  Fatin Bachicha B Autym Siess   Jonathan Kirkendoll, [place], [place] 06/12/23 1118

## 2023-06-12 NOTE — ED Provider Notes (Signed)
 [name] Provider Note   CSN: [id number] Arrival date & time: 06/12/23  1124     History  Chief Complaint  Patient presents with   Chest Pain    Heather [name] is a 78 y.o. female.  Patient is a 78 year old female with a history of [place], dyslipidemia, anemia who is presenting today from urgent care due to nonspecific chest pain a cough and a headache.  Patient reports her symptoms started yesterday.  She noticed a headache on the right side of her head that went down behind her ear.  That has been waxing and waning but since yesterday she had several episodes of pressure in her chest that lasted maybe 1 minutes but felt better if she coughed.  She had it again today and she thought she better get it checked out.  Her husband did reports she was coughing a lot last night but she reports it was a dry cough.  She has not had a fever, new leg swelling or any shortness of breath.  She denies any abdominal discomfort.  No recent medication changes.  At urgent care due to her history and complaints they felt she needed further evaluation.  Patient denies any chest pain at this time.  She reports only a mild headache which she did take Tylenol for.  The history is provided by the patient, the spouse and medical records.  Chest Pain      Home Medications Prior to Admission medications   Medication Sig Start Date End Date Taking? Authorizing Provider  acetaminophen (TYLENOL) 500 MG tablet Take 1,000 mg by mouth 3 (three) times daily as needed for moderate pain.    [provider]  Boswellia-Glucosamine-Vit [name] (OSTEO BI-FLEX ONE PER DAY PO) Take 1 tablet by mouth daily.    [provider]  Calcium Carb-Cholecalciferol (CHEWABLE CALCIUM/D3 PO) Take 1 tablet by mouth daily.    [provider]  cetirizine (ZYRTEC) 10 MG tablet Take 10 mg by mouth daily.    [provider]  clopidogrel (PLAVIX) 75 MG tablet Take 1  tablet (75 mg total) by mouth daily. 11/03/22   Heather Eisenmenger, MD  Coenzyme Q10 (CO Q 10 PO) Take 400 mg by mouth daily.    [provider]  Cyanocobalamin 1000 MCG CAPS Take 1 capsule by mouth daily.    [provider]  DULoxetine (CYMBALTA) 20 MG capsule Take 1 capsule (20 mg total) by mouth daily. 04/20/23   Smith, Zachary M, DO  ezetimibe (ZETIA) 10 MG tablet Take 1 tablet (10 mg total) by mouth daily. 05/25/23   Heather Dauphin, MD  Ferrous Sulfate (IRON) 325 (65 FE) MG TABS Take 1 tablet by mouth daily.    [provider]  furosemide (LASIX) 20 MG tablet TAKE 1 TABLET TWICE A DAY Patient taking differently: Take 20 mg by mouth daily. 01/22/23   Heather Dauphin, MD  gabapentin (NEURONTIN) 100 MG capsule TAKE 3 CAPSULES AT BEDTIME 05/27/23   Heather Dauphin, MD  KLOR-CON M20 20 MEQ tablet TAKE 1 TABLET DAILY 05/06/23   Heather Dauphin, MD  Multiple Vitamin (MULTIVITAMIN) tablet Take 1 tablet by mouth daily.    [provider]  naproxen sodium (ALEVE) 220 MG tablet Take 220 mg by mouth as needed.    [provider]  Omega-3 Fatty Acids (FISH OIL PO) Take 2 g by mouth daily.    [provider]  pantoprazole (PROTONIX) 20  MG tablet TAKE 1 TABLET TWICE A DAY, 30 MINUTES BEFORE FOOD 05/06/23   Heather Dauphin, MD  polyethylene glycol (MIRALAX / GLYCOLAX) 17 g packet Take 17 g by mouth every other day.    [provider]  pyridoxine (B-6) 200 MG tablet Take 200 mg by mouth daily.    [provider]  rosuvastatin (CRESTOR) 40 MG tablet TAKE 1 TABLET DAILY AT NIGHT 05/06/23   Heather Dauphin, MD  solifenacin (VESICARE) 10 MG tablet Take 1 tablet (10 mg total) by mouth daily. 05/31/23   Stoneking, Ponce Brisker., MD  topiramate (TOPAMAX) 25 MG tablet Take 3 tablets (75 mg total) by mouth at bedtime. 12/14/22   Heather Hammed, NP  traMADol (ULTRAM) 50 MG tablet Take 1 tablet (50 mg total) by mouth 2 (two) times daily as needed. 07/10/22   Heather Cross, PA-C  TURMERIC PO Take 538 mg by mouth daily.    [provider]  VEVYE 0.1 % SOLN Apply 1 drop to eye 2 (two) times daily. 06/11/23   [provider]  Vitamin [name], Ergocalciferol, (DRISDOL) 1.25 MG (50000 UNIT) CAPS capsule Take 1 capsule (50,000 Units total) by mouth every 7 (seven) days. 04/21/23   Heather Mesi D, MD  vitamin E 200 UNIT capsule Take 200 Units by mouth daily.    [provider]      Allergies    Aspirin, Coconut (cocos nucifera), Lisinopril, Metoprolol, Other, Peanut-containing drug products, Prednisone, Strawberry extract, and Penicillins    Review of Systems   Review of Systems  Cardiovascular:  Positive for chest pain.    Physical Exam Updated Vital Signs BP (!) 139/51   Pulse 61   Temp 98 F (36.7 C)   Resp (!) 21   Ht 5\' 4"  (1.626 [name])   Wt 92.5 kg   SpO2 99%   BMI 35.02 kg/[name]  Physical Exam Vitals and nursing note reviewed.  Constitutional:      General: She is not in acute distress.    Appearance: She is well-developed.  HENT:     Head: Normocephalic and atraumatic.     Right Ear: Tympanic membrane normal.     Left Ear: A middle ear effusion is present.     Nose:     Right Turbinates: Swollen.  Eyes:     Pupils: Pupils are equal, round, and reactive to light.  Cardiovascular:     Rate and Rhythm: Normal rate and regular rhythm.     Heart sounds: Normal heart sounds. No murmur heard.    No friction rub.  Pulmonary:     Effort: Pulmonary effort is normal.     Breath sounds: Normal breath sounds. No wheezing or rales.  Abdominal:     General: Bowel sounds are normal. There is no distension.     Palpations: Abdomen is soft.     Tenderness: There is no abdominal tenderness. There is no guarding or rebound.  Musculoskeletal:        General: No tenderness. Normal range of motion.     Comments: Trace edema in bilateral lower extremities  Skin:    General: Skin is warm and dry.     Findings: No rash.   Neurological:     Mental Status: She is alert and oriented to person, place, and time.     Cranial Nerves: No cranial nerve deficit.  Psychiatric:        Behavior: Behavior normal.     ED Results /  Procedures / Treatments   Labs (all labs ordered are listed, but only abnormal results are displayed) Labs Reviewed  CBC WITH DIFFERENTIAL/PLATELET - Abnormal; Notable for the following components:      Result Value   WBC 3.4 (*)    All other components within normal limits  BASIC METABOLIC PANEL WITH GFR - Abnormal; Notable for the following components:   Potassium 3.4 (*)    Calcium 8.8 (*)    All other components within normal limits  TROPONIN I (HIGH SENSITIVITY)  TROPONIN I (HIGH SENSITIVITY)    EKG EKG Interpretation Date/Time:  Saturday June 12 2023 11:40:02 EDT Ventricular Rate:  60 PR Interval:  151 QRS Duration:  99 QT Interval:  457 QTC Calculation: 457 R Axis:   70  Text Interpretation: Sinus rhythm Borderline T abnormalities, anterior leads No significant change since last tracing Confirmed by Almond Army ([id number]) on 06/12/2023 12:03:06 PM  Radiology DG Chest Port 1 View Result Date: 06/12/2023 CLINICAL DATA:  Chest pain EXAM: PORTABLE CHEST 1 VIEW COMPARISON:  Chest radiograph 04/28/2022, CT 04/25/2022 FINDINGS: Stable cardiac silhouette. Large hiatal hernia again noted in the RIGHT hemithorax. Lung bases poorly evaluated. No focal consolidation evident. No pneumothorax. No pulmonary edema. IMPRESSION: 1. Large hiatal hernia with stomach and colon in the RIGHT hemithorax. 2. No clear acute findings. Electronically Signed   By: Deboraha Fallow [name].[name].   On: 06/12/2023 12:59    Procedures Procedures    Medications Ordered in ED Medications - No data to display  ED Course/ Medical Decision Making/ A&P                                 Medical Decision Making Amount and/or Complexity of Data Reviewed Independent Historian: EMS External Data Reviewed:  notes. Labs: ordered. Decision-making details documented in ED Course. Radiology: ordered and independent interpretation performed. Decision-making details documented in ED Course. ECG/medicine tests: ordered and independent interpretation performed. Decision-making details documented in ED Course.   Pt with multiple medical problems and comorbidities and presenting today with a complaint that caries a high risk for morbidity and mortality.  Here today with nonspecific complaints.  Seems that the headache and pain behind her ear is worse than the occasional chest tightness she is having that lasts for usually less than 2 minutes.  Symptoms seem more classic for allergies versus URI.  Low suspicion for PE.  Also patient's description of her symptoms are less likely to be ACS.  She is hemodynamically stable at this time.  No findings to suggest PE.  No findings to suggest intracranial pathology such as stroke, intracranial bleed and no altered mental status.  I independently interpreted patient's EKG which shows no acute findings.  She does have some borderline T wave inversion but no new findings from prior EKGs.  Will do a chest x-ray and blood work to ensure no other acute findings.  Sats are 100% on room air.  2:50 PM I independently interpreted patient's labs and CBC with minimal leukopenia with a white count of 3.4 which is unchanged from prior, stable hemoglobin, BMP within normal limits, troponin is normal.  I have independently visualized and interpreted pt's images today.  Patient has a large hiatal hernia but no other acute findings.  Radiology reports stomach and colon are in the right hemithorax which has been unchanged.  However suspect that may be a possible cause of her symptoms today.  She remains  well-appearing blood pressures have been stable.  At this time she appears stable for discharge home.  Findings discussed with the patient and her family and they are comfortable with this plan she  was given return precautions.  No indication for admission at this time.          Final Clinical Impression(s) / ED Diagnoses Final diagnoses:  Nonspecific chest pain  Intractable episodic headache, unspecified headache type    Rx / DC Orders ED Discharge Orders     None         Almond Army, MD 06/12/23 1453

## 2023-06-12 NOTE — Discharge Instructions (Signed)
-  EKG performed in UC during triage shows normal sinus rhythm with nonspecific T wave abnormality, slightly prolonged QT, ventricular rate of 65 bpm, no STEMI. -Based on current symptoms and current risk factors for cardiac event follow-up in emergency department immediately for further evaluation and management is recommended. -Please go across the street to the ER as soon as you leave urgent care for further testing and treatment. -Based on concern for symptoms evaluation is beyond the means of urgent care and needs advanced management in the emergency department.

## 2023-06-17 ENCOUNTER — Encounter: Payer: Self-pay | Admitting: Internal Medicine

## 2023-06-22 ENCOUNTER — Ambulatory Visit (INDEPENDENT_AMBULATORY_CARE_PROVIDER_SITE_OTHER): Admitting: Family Medicine

## 2023-06-23 ENCOUNTER — Encounter (INDEPENDENT_AMBULATORY_CARE_PROVIDER_SITE_OTHER): Payer: Self-pay | Admitting: Family Medicine

## 2023-06-23 ENCOUNTER — Ambulatory Visit (INDEPENDENT_AMBULATORY_CARE_PROVIDER_SITE_OTHER): Admitting: Family Medicine

## 2023-06-23 VITALS — BP 131/65 | HR 66 | Temp 98.5°F | Ht 64.0 in | Wt 203.0 lb

## 2023-06-23 DIAGNOSIS — J309 Allergic rhinitis, unspecified: Secondary | ICD-10-CM | POA: Diagnosis not present

## 2023-06-23 DIAGNOSIS — F3289 Other specified depressive episodes: Secondary | ICD-10-CM

## 2023-06-23 DIAGNOSIS — R079 Chest pain, unspecified: Secondary | ICD-10-CM | POA: Insufficient documentation

## 2023-06-23 DIAGNOSIS — F509 Eating disorder, unspecified: Secondary | ICD-10-CM | POA: Insufficient documentation

## 2023-06-23 DIAGNOSIS — J301 Allergic rhinitis due to pollen: Secondary | ICD-10-CM

## 2023-06-23 DIAGNOSIS — Z6834 Body mass index (BMI) 34.0-34.9, adult: Secondary | ICD-10-CM | POA: Diagnosis not present

## 2023-06-23 DIAGNOSIS — E669 Obesity, unspecified: Secondary | ICD-10-CM | POA: Diagnosis not present

## 2023-06-23 DIAGNOSIS — I10 Essential (primary) hypertension: Secondary | ICD-10-CM | POA: Diagnosis not present

## 2023-06-23 DIAGNOSIS — R519 Headache, unspecified: Secondary | ICD-10-CM | POA: Insufficient documentation

## 2023-06-23 NOTE — Progress Notes (Unsigned)
 Subjective:    Patient ID: Heather [name], female    DOB: 1946-01-15, 78 y.o.   MRN: [id number]     HPI Heather [name] is here for follow up from the emergency room.  4/12: Initially went to urgent care and then was directed to the emergency room for chest pain.  She presented with nonspecific chest pain, cough and headache.  Symptoms started the day prior.  Headache was on the right side of her head that extended behind her ear.  The headache waxed and waned.  She had several episodes of pressure in her chest that lasted approximately 1 minute and improved when she coughed.  Experienced the same symptoms the day she went to the emergency room and thought she should have evaluated.  Her husband stated she was coughing a lot at night.  It is a dry cough.  No fever, leg swelling, shortness of breath or abdominal pain.  No recent medication changes.  EKG without change.  Chest x-ray with large hiatal hernia with stomach and colon in the right hemithorax.  No acute findings.  BMP with slightly low potassium, CBC unremarkable, troponin negative.  Symptoms thought to be related to allergies more than URI.  Low suspicion for ACS, PE.  Diagnosed with nonspecific chest pain, intractable episodic headache.  She left the ED feeling good.  Since then has felt fine until allergy/cold symptoms started.  Peviously has not had issues with allergies - takes an allergy pill daily.  She is sneezing, coughing, fatigued, congesetion.  Everyone in the choir had similar symptoms.    No st, nothing in chest.  She states her symptoms have improved.   Medications and allergies reviewed with patient and updated if appropriate.  Current Outpatient Medications on File Prior to Visit  Medication Sig Dispense Refill   acetaminophen  (TYLENOL ) 500 MG tablet Take 1,000 mg by mouth 3 (three) times daily as needed for moderate pain.     Boswellia-Glucosamine-Vit D (OSTEO BI-FLEX ONE PER DAY PO) Take 1 tablet by mouth  daily.     Calcium  Carb-Cholecalciferol (CHEWABLE CALCIUM /D3 PO) Take 1 tablet by mouth daily.     cetirizine (ZYRTEC) 10 MG tablet Take 10 mg by mouth daily.     clopidogrel  (PLAVIX ) 75 MG tablet Take 1 tablet (75 mg total) by mouth daily. 90 tablet 3   Coenzyme Q10 (CO Q 10 PO) Take 400 mg by mouth daily.     Cyanocobalamin  1000 MCG CAPS Take 1 capsule by mouth daily.     DULoxetine  (CYMBALTA ) 20 MG capsule Take 1 capsule (20 mg total) by mouth daily. 90 capsule 0   ezetimibe  (ZETIA ) 10 MG tablet Take 1 tablet (10 mg total) by mouth daily. 90 tablet 3   Ferrous Sulfate  (IRON) 325 (65 FE) MG TABS Take 1 tablet by mouth daily.     furosemide  (LASIX ) 20 MG tablet TAKE 1 TABLET TWICE A DAY (Patient taking differently: Take 20 mg by mouth daily.) 180 tablet 3   gabapentin  (NEURONTIN ) 100 MG capsule TAKE 3 CAPSULES AT BEDTIME 270 capsule 3   KLOR-CON  M20 20 MEQ tablet TAKE 1 TABLET DAILY 90 tablet 3   Multiple Vitamin (MULTIVITAMIN) tablet Take 1 tablet by mouth daily.     naproxen sodium (ALEVE) 220 MG tablet Take 220 mg by mouth as needed.     Omega-3 Fatty Acids (FISH OIL PO) Take 2 g by mouth daily.     pantoprazole  (PROTONIX ) 20 MG tablet TAKE 1  TABLET TWICE A DAY, 30 MINUTES BEFORE FOOD 180 tablet 3   polyethylene glycol (MIRALAX  / GLYCOLAX ) 17 g packet Take 17 g by mouth every other day.     pyridoxine  (B-6) 200 MG tablet Take 200 mg by mouth daily.     rosuvastatin  (CRESTOR ) 40 MG tablet TAKE 1 TABLET DAILY AT NIGHT 90 tablet 3   solifenacin  (VESICARE ) 10 MG tablet Take 1 tablet (10 mg total) by mouth daily. 90 tablet 3   topiramate  (TOPAMAX ) 25 MG tablet Take 3 tablets (75 mg total) by mouth at bedtime. 270 tablet 3   traMADol  (ULTRAM ) 50 MG tablet Take 1 tablet (50 mg total) by mouth 2 (two) times daily as needed. 30 tablet 2   TURMERIC PO Take 538 mg by mouth daily.     VEVYE 0.1 % SOLN Apply 1 drop to eye 2 (two) times daily.     Vitamin D , Ergocalciferol , (DRISDOL ) 1.25 MG (50000  UNIT) CAPS capsule Take 1 capsule (50,000 Units total) by mouth every 7 (seven) days. 13 capsule 0   vitamin E  200 UNIT capsule Take 200 Units by mouth daily.     No current facility-administered medications on file prior to visit.     Review of Systems  Constitutional:  Positive for fatigue. Negative for fever.  HENT:  Positive for congestion (mild) and sneezing. Negative for postnasal drip and sore throat.   Respiratory:  Positive for cough (dry) and shortness of breath (chronic - no chagne). Negative for wheezing.   Neurological:  Positive for headaches (a couple of times).       Objective:   Vitals:   06/24/23 1059  BP: 126/60  Pulse: 67  Temp: 98.7 F (37.1 C)  SpO2: 98%   BP Readings from Last 3 Encounters:  06/24/23 126/60  06/23/23 131/65  06/12/23 (!) 139/51   Wt Readings from Last 3 Encounters:  06/24/23 206 lb (93.4 kg)  06/23/23 203 lb (92.1 kg)  06/12/23 204 lb (92.5 kg)   Body mass index is 35.36 kg/m.    Physical Exam Constitutional:      General: She is not in acute distress.    Appearance: Normal appearance. She is not ill-appearing.  HENT:     Head: Normocephalic and atraumatic.     Right Ear: Tympanic membrane, ear canal and external ear normal.     Left Ear: Tympanic membrane, ear canal and external ear normal.     Mouth/Throat:     Mouth: Mucous membranes are moist.     Pharynx: No oropharyngeal exudate or posterior oropharyngeal erythema.  Eyes:     Conjunctiva/sclera: Conjunctivae normal.  Cardiovascular:     Rate and Rhythm: Normal rate and regular rhythm.  Pulmonary:     Effort: Pulmonary effort is normal. No respiratory distress.     Breath sounds: Normal breath sounds. No wheezing or rales.  Musculoskeletal:     Cervical back: Neck supple. No tenderness.  Lymphadenopathy:     Cervical: No cervical adenopathy.  Skin:    General: Skin is warm and dry.  Neurological:     Mental Status: She is alert.        Lab Results   Component Value Date   WBC 3.4 (L) 06/12/2023   HGB 12.6 06/12/2023   HCT 39.5 06/12/2023   PLT 151 06/12/2023   GLUCOSE 85 06/12/2023   CHOL 142 06/02/2023   TRIG 69 06/02/2023   HDL 69 06/02/2023   LDLCALC 59 06/02/2023   ALT  16 05/25/2023   AST 23 05/25/2023   NA 138 06/12/2023   K 3.4 (L) 06/12/2023   CL 102 06/12/2023   CREATININE 0.76 06/12/2023   BUN 13 06/12/2023   CO2 25 06/12/2023   TSH 0.62 05/26/2022   INR 1.1 04/26/2018   HGBA1C 5.7 05/25/2023   MICROALBUR <0.7 05/25/2023     Assessment & Plan:    See Problem List for Assessment and Plan of chronic medical problems.

## 2023-06-23 NOTE — Progress Notes (Signed)
 Office: [phone]  /  Fax: [phone]  WEIGHT SUMMARY AND BIOMETRICS  Anthropometric Measurements Height: 5\' 4"  (1.626 m) Weight: 203 lb (92.1 kg) BMI (Calculated): 34.83 Weight at Last Visit: 203lb Weight Lost Since Last Visit: 0 Weight Gained Since Last Visit: 0 Total Weight Loss (lbs): 56 lb (25.4 kg)   Body Composition  Body Fat %: 47.5 % Fat Mass (lbs): 96.8 lbs Muscle Mass (lbs): 101.4 lbs Total Body Water (lbs): 79 lbs Visceral Fat Rating : 15   Other Clinical Data Fasting: no Labs: no Today's Visit #: 24 Starting Date: 12/23/16    Chief Complaint: OBESITY   History of Present Illness Heather [name] is a 78 year old female who presents for a follow-up on her obesity treatment plan and progress assessment.  She has been following a category two eating plan, maintaining a 1200 calorie and 75 gram protein diet. Despite occasional illness over the past month, she has maintained her weight and engaged in exercise for about 30 minutes on most days when feeling well. She experiences increased sweet cravings and difficulty finding a suitable replacement for a previously used product, leading to feelings of deprivation and challenges in maintaining her dietary plan. She consumes salads and small portions of prepared foods but acknowledges not eating enough, contributing to her fatigue. She uses [place] in salads and occasionally consumes shrimp for protein. Her husband often adds less healthy options to meals, which she tries to avoid. She is concerned about appearing 'drawn' and notes changes in her facial appearance, which she attributes to weight loss.  She experienced chest pain on a Saturday, which was not severe but concerning enough to seek urgent medical care. Her daughter drove her to the urgent care, which was closed, so she proceeded to the emergency room. By the time she arrived, the pain had subsided, but she had experienced a headache on the right side and  arm pain. Diagnostic tests, including x-rays, were performed, and no cardiac issues were found. However, an 'incredible hernia' was noted, which was not previously known to her.  Following the ER visit, she developed symptoms consistent with seasonal allergies, including sneezing and coughing, but no productive phlegm. 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 She has been taking Zyrtec daily, which she believes has helped prevent worsening of symptoms, and has used DayQuil a couple of times with minimal relief.  She has been monitoring her blood pressure twice daily with a wrist cuff, noting readings in the high teens to 120s over 60s, seldom reaching 130/70. She reports her blood pressure is better at home compared to the ER visit, where it was elevated.      PHYSICAL EXAM:  Blood pressure 131/65, pulse 66, temperature 98.5 F (36.9 C), height 5\' 4"  (1.626 m), weight 203 lb (92.1 kg), SpO2 100%. Body mass index is 34.84 kg/m.  DIAGNOSTIC DATA REVIEWED:  BMET    Component Value Date/Time   NA 138 06/12/2023 1250   NA 144 10/28/2022 1159   K 3.4 (L) 06/12/2023 1250   CL 102 06/12/2023 1250   CO2 25 06/12/2023 1250   GLUCOSE 85 06/12/2023 1250   BUN 13 06/12/2023 1250   BUN 17 10/28/2022 1159   CREATININE 0.76 06/12/2023 1250   CREATININE 0.80 12/07/2019 1246   CREATININE 0.81 12/28/2011 0853   CALCIUM  8.8 (L) 06/12/2023 1250   GFRNONAA >60 06/12/2023 1250   GFRNONAA >60 12/07/2019  1246   GFRAA 100 10/09/2019 1425   Lab Results  Component Value Date   HGBA1C 5.7 05/25/2023   HGBA1C 6.2 (H) 10/06/2006   Lab Results  Component Value Date   INSULIN  6.0 11/25/2020   INSULIN  20.9 12/23/2016   Lab Results  Component Value Date   TSH 0.62 05/26/2022   CBC    Component Value Date/Time   WBC 3.4 (L) 06/12/2023 1250   RBC 3.98 06/12/2023 1250   HGB 12.6 06/12/2023 1250    HGB 12.0 04/14/2021 1140   HCT 39.5 06/12/2023 1250   HCT 38.3 04/14/2021 1140   PLT 151 06/12/2023 1250   PLT 238 04/14/2021 1140   MCV 99.2 06/12/2023 1250   MCV 94 04/14/2021 1140   MCH 31.7 06/12/2023 1250   MCHC 31.9 06/12/2023 1250   RDW 14.6 06/12/2023 1250   RDW 13.8 04/14/2021 1140   Iron Studies    Component Value Date/Time   IRON 71 05/25/2023 1441   IRON 42 04/14/2021 1140   TIBC 228.2 (L) 05/25/2023 1441   TIBC 231 (L) 04/14/2021 1140   FERRITIN 153.9 05/25/2023 1441   FERRITIN 338 (H) 04/14/2021 1140   IRONPCTSAT 31.1 05/25/2023 1441   IRONPCTSAT 18 04/14/2021 1140   IRONPCTSAT 17 04/26/2018 1602   Lipid Panel     Component Value Date/Time   CHOL 142 06/02/2023 1242   CHOL 149 11/25/2020 1131   TRIG 69 06/02/2023 1242   HDL 69 06/02/2023 1242   HDL 62 11/25/2020 1131   CHOLHDL 2.1 06/02/2023 1242   VLDL 14 06/02/2023 1242   LDLCALC 59 06/02/2023 1242   LDLCALC 73 11/25/2020 1131   Hepatic Function Panel     Component Value Date/Time   PROT 7.2 05/25/2023 1441   PROT 7.0 10/28/2022 1159   ALBUMIN 4.0 05/25/2023 1441   ALBUMIN 4.0 10/28/2022 1159   AST 23 05/25/2023 1441   AST 19 12/07/2019 1246   ALT 16 05/25/2023 1441   ALT 20 12/07/2019 1246   ALKPHOS 62 05/25/2023 1441   BILITOT 0.3 05/25/2023 1441   BILITOT 0.2 10/28/2022 1159   BILITOT 0.3 12/07/2019 1246   BILIDIR 0.1 02/15/2015 1010   IBILI 0.2 12/28/2011 0853      Component Value Date/Time   TSH 0.62 05/26/2022 1552   Nutritional Lab Results  Component Value Date   VD25OH 89.5 10/28/2022   VD25OH 48.25 11/21/2021   VD25OH 55.3 04/14/2021     Assessment and Plan Assessment & Plan Obesity Weight maintenance despite recent illness and decreased appetite. Regular exercise when well. Increased sweet cravings and difficulty finding suitable low-calorie, low-sugar rewards. Discussed strategies for maintaining protein intake and easy meal preparation to prevent under-eating and  metabolic slowdown. - Continue category two eating plan and journaling with a 1200 calorie and 75 gram protein diet. - Incorporate easy-to-prepare meals, such as soups and salads, to ensure adequate nutrition. - Explore options for low-calorie, low-sugar treats to manage sweet cravings.  Hypertension Slightly elevated blood pressure compared to previous readings but within acceptable range. Home readings generally better than in-office readings. Uses a wrist cuff for home monitoring, which may be slightly inaccurate. Current readings mostly in the high teens to 120s over 60s, seldom reaching 130s over 70s. - Monitor blood pressure at home once daily at different times of the day. - Avoid checking blood pressure during stressful activities or after watching the news.  Allergic rhinitis Recent sneezing and coughing without productive phlegm, likely due to seasonal allergies  exacerbated by high pollen levels. Currently taking Zyrtec daily, which may not fully control symptoms. DayQuil provided minimal relief. - Continue Zyrtec daily. - Consider Flonase nasal spray for additional symptom relief, ensuring proper application technique by aiming towards the outside corner of the eye.   She was informed of the importance of frequent follow up visits to maximize her success with intensive lifestyle modifications for her multiple health conditions.    Jasmine Mesi, MD

## 2023-06-24 ENCOUNTER — Encounter: Payer: Self-pay | Admitting: Internal Medicine

## 2023-06-24 ENCOUNTER — Ambulatory Visit (INDEPENDENT_AMBULATORY_CARE_PROVIDER_SITE_OTHER): Admitting: Internal Medicine

## 2023-06-24 VITALS — BP 126/60 | HR 67 | Temp 98.7°F | Ht 64.0 in | Wt 206.0 lb

## 2023-06-24 DIAGNOSIS — R079 Chest pain, unspecified: Secondary | ICD-10-CM | POA: Diagnosis not present

## 2023-06-24 DIAGNOSIS — I10 Essential (primary) hypertension: Secondary | ICD-10-CM | POA: Diagnosis not present

## 2023-06-24 DIAGNOSIS — R519 Headache, unspecified: Secondary | ICD-10-CM | POA: Diagnosis not present

## 2023-06-24 DIAGNOSIS — J069 Acute upper respiratory infection, unspecified: Secondary | ICD-10-CM | POA: Diagnosis not present

## 2023-06-24 NOTE — Assessment & Plan Note (Signed)
 Acute Resolved W/u in ED reassuring Likely related to URI

## 2023-06-24 NOTE — Patient Instructions (Addendum)
       Medications changes include :   None      Return for follow up as scheduled.

## 2023-06-24 NOTE — Assessment & Plan Note (Signed)
Acute Symptoms likely viral in nature Continue symptomatic treatment with over-the-counter cold medications, Tylenol/ibuprofen Increase rest and fluids Call if symptoms worsen or do not improve  

## 2023-06-24 NOTE — Assessment & Plan Note (Signed)
 Chronic BP controlled Furosemide  20 mg twice daily

## 2023-07-06 ENCOUNTER — Ambulatory Visit (INDEPENDENT_AMBULATORY_CARE_PROVIDER_SITE_OTHER): Payer: Medicare Other | Admitting: Podiatry

## 2023-07-06 DIAGNOSIS — D2371 Other benign neoplasm of skin of right lower limb, including hip: Secondary | ICD-10-CM

## 2023-07-06 DIAGNOSIS — B351 Tinea unguium: Secondary | ICD-10-CM | POA: Diagnosis not present

## 2023-07-06 DIAGNOSIS — D689 Coagulation defect, unspecified: Secondary | ICD-10-CM

## 2023-07-06 DIAGNOSIS — D2372 Other benign neoplasm of skin of left lower limb, including hip: Secondary | ICD-10-CM | POA: Diagnosis not present

## 2023-07-06 DIAGNOSIS — M79676 Pain in unspecified toe(s): Secondary | ICD-10-CM

## 2023-07-07 NOTE — Progress Notes (Signed)
 She presents today chief complaint of painful elongated toenails 1 through 5 bilateral.  Also painful calluses.  Objective: Pulses are palpable no open lesions or wounds are noted.  Skin is supple and well-hydrated toenails are thick yellow dystrophic clinically mycotic with benign skin lesions plantar aspect of the forefoot.  Assessment: Pain limb secondary onychomycosis pain limb secondary to benign skin lesions.  Plan: Debridement of toenails 1 through 5 bilateral debridement benign skin lesions.

## 2023-07-19 NOTE — Progress Notes (Signed)
 [facility] [address] [place] [address] Phone: [phone] Subjective:   IBryan [name], am serving as a scribe for Dr. Ronnell Coins.  I'm seeing this patient by the request  of:  [name], MD  CC: Bilateral knee pain and new onset of left arm sensation  WGN:[id number]  04/20/2023 Discussed with patient to continue to do what she is doing.  Will continue to monitor the weight that she has done well and BMI is under 35.  Would be a surgical candidate if she needed but not at the moment doing very well with this as well as the low dose of the Cymbalta .  We discussed potentially increasing to 30 mg which patient declined.  Follow-up with me again 3 months      UPDATE 07/20/2023 Heather [name] is a 78 y.o. female coming in with complaint of B knee pain. Patient states doing well. Only small like pains here and there. Did elliptical this morning and still feeling good. Patient states that the knees are feeling good.  Unfortunately does have some weird sensation in the left arm intermittently.  Has been since had blood drawn recently.      Past Medical History:  Diagnosis Date   Abdominal hernia    Abnormal Pap smear    ALLERGIC RHINITIS 10/22/2006   Allergy    ANEMIA 12/18/2008   Arthritis    scoliosis moderate deg changes lumbar Xray 11/04/17    Asthma    ASYMPTOMATIC POSTMENOPAUSAL STATUS 11/22/2007   Back pain    CAD (coronary artery disease)    in LAD   CHF (congestive heart failure) (HCC)    Complication of anesthesia 2010   pt states she woke up during colonoscopy and was aware of "losing control of breathing" during mammoplasty   Degenerative arthritis    Diverticulosis    Dyslipidemia    Fibroid    Frequent headaches    GERD 07/20/2007   GOITER, MULTINODULAR 07/20/2007   Headache(784.0) 07/20/2007   HEARING LOSS 11/22/2007   Hiatal hernia    Hiatal hernia    large   History of chicken pox    Hx of colposcopy with  cervical biopsy    HYPERCHOLESTEROLEMIA 01/13/2007   Hyperglycemia    HYPERTENSION 10/22/2006   Lung nodule    unchanged since 05/26/13    Migraines    NASH (nonalcoholic steatohepatitis)    Nocturnal hypoxemia 02/17/2013   Obesity    Obstructive sleep apnea    OSA on CPAP    per neurology   OSTEOARTHRITIS 10/22/2006   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   Pre-diabetes    Sedimentation rate elevation    Sleep apnea    on cpap   Urine incontinence    UTI (urinary tract infection)    Past Surgical History:  Procedure Laterality Date   BREAST SURGERY  1984   Breast reduction b/l    CATARACT EXTRACTION, BILATERAL     DEXA  08/2005   DILATION AND CURETTAGE OF UTERUS     ELECTROCARDIOGRAM  10/15/2006   ESOPHAGOGASTRODUODENOSCOPY  12/08/2005   FOOT SURGERY     hammertoe and bunion 05/2017    Stress Cardiolite  10/21/2005   sweat gland removal     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   Procedure: Left TOTAL KNEE ARTHROPLASTY;  Surgeon: Arnie Lao, MD;  Location: [facility] OR;  Service: Orthopedics;  Laterality: Left;   UMBILICAL HERNIA REPAIR N/A 04/25/2022  Procedure: OPEN INCARCERATED HERNIA REPAIR UMBILICAL WITH APPENDECTOMY;  Surgeon: Marny Sires Avon Boers, MD;  Location: [facility] OR;  Service: General;  Laterality: N/A;   WISDOM TOOTH EXTRACTION     Social History   Socioeconomic History   Marital status: Married    Spouse name: [name]   Number of children: 2   Years of education: [employer] education level: Bachelor's degree (e.g., BA, AB, BS)  Occupational History   Occupation: Retired  Tobacco Use   Smoking status: Former    Current packs/day: 0.00    Average packs/day: 2.0 packs/day for 20.0 years (40.0 ttl pk-yrs)    Types: Cigarettes    Start date: 03/03/1959    Quit date: 03/03/1979    Years since quitting: 44.4    Passive exposure: Past   Smokeless tobacco: Never  Vaping Use   Vaping status: Never Used  Substance and Sexual Activity    Alcohol use: Yes    Comment: rare - TWICE A YR   Drug use: No   Sexual activity: Yes    Birth control/protection: Surgical  Other Topics Concern   Not on file  Social History Narrative   Patient is married [name]).   Right Handed   Drinks 1 cup caffeine   Social Drivers of Corporate investment banker Strain: Low Risk  (05/27/2023)   Overall Financial Resource Strain (CARDIA)    Difficulty of Paying Living Expenses: Not hard at all  Food Insecurity: No Food Insecurity (05/27/2023)   Hunger Vital Sign    Worried About Running Out of Food in the Last Year: Never true    Ran Out of Food in the Last Year: Never true  Transportation Needs: No Transportation Needs (05/27/2023)   PRAPARE - Administrator, Civil Service (Medical): No    Lack of Transportation (Non-Medical): No  Physical Activity: Sufficiently Active (05/27/2023)   Exercise Vital Sign    Days of Exercise per Week: 7 days    Minutes of Exercise per Session: 30 min  Recent Concern: Physical Activity - Insufficiently Active (05/24/2023)   Exercise Vital Sign    Days of Exercise per Week: 3 days    Minutes of Exercise per Session: 30 min  Stress: No Stress Concern Present (05/27/2023)   [employer] of Occupational Health - Occupational Stress Questionnaire    Feeling of Stress : Not at all  Social Connections: Socially Integrated (05/27/2023)   Social Connection and Isolation Panel [NHANES]    Frequency of Communication with Friends and Family: More than three times a week    Frequency of Social Gatherings with Friends and Family: More than three times a week    Attends Religious Services: More than 4 times per year    Active Member of [employer] or Organizations: Yes    Attends Engineer, structural: More than 4 times per year    Marital Status: Married   Allergies  Allergen Reactions   Aspirin Hives   Coconut (Cocos Nucifera) Hives   Lisinopril Cough   Metoprolol  Itching   Other     Latex  paint, the smell causes hives. Not allergic to latex gloves   General anesthesia - has had issues in past where anesthesia was not properly administered and caused side effects   Peanut-Containing Drug Products Hives, Itching and Other (See Comments)   Prednisone     Unknown reaction    Strawberry Extract Hives   Penicillins Rash   Family History  Problem Relation  Age of Onset   Asthma Mother    Depression Mother    Bipolar disorder Mother    Dementia Mother    Arthritis Mother    Breast cancer Mother        primary   Colon cancer Mother        mets from breast   Cancer Mother        colon   Hypertension Father    Migraines Father    Cancer Maternal Aunt        ?   Heart disease Maternal Grandmother    Cancer Maternal Grandfather        stomach ?   Sleep apnea Neg Hx    Esophageal cancer Neg Hx    Stomach cancer Neg Hx    Rectal cancer Neg Hx      Current Outpatient Medications (Cardiovascular):    ezetimibe  (ZETIA ) 10 MG tablet, Take 1 tablet (10 mg total) by mouth daily.   furosemide  (LASIX ) 20 MG tablet, TAKE 1 TABLET TWICE A DAY (Patient taking differently: Take 20 mg by mouth daily.)   rosuvastatin  (CRESTOR ) 40 MG tablet, TAKE 1 TABLET DAILY AT NIGHT  Current Outpatient Medications (Respiratory):    cetirizine (ZYRTEC) 10 MG tablet, Take 10 mg by mouth daily.  Current Outpatient Medications (Analgesics):    acetaminophen  (TYLENOL ) 500 MG tablet, Take 1,000 mg by mouth 3 (three) times daily as needed for moderate pain.   naproxen sodium (ALEVE) 220 MG tablet, Take 220 mg by mouth as needed.   traMADol  (ULTRAM ) 50 MG tablet, Take 1 tablet (50 mg total) by mouth 2 (two) times daily as needed.  Current Outpatient Medications (Hematological):    clopidogrel  (PLAVIX ) 75 MG tablet, Take 1 tablet (75 mg total) by mouth daily.   Cyanocobalamin  1000 MCG CAPS, Take 1 capsule by mouth daily.   Ferrous Sulfate  (IRON) 325 (65 FE) MG TABS, Take 1 tablet by mouth  daily.  Current Outpatient Medications (Other):    doxycycline  (VIBRA -TABS) 100 MG tablet, Take 1 tablet (100 mg total) by mouth 2 (two) times daily.   Boswellia-Glucosamine-Vit D (OSTEO BI-FLEX ONE PER DAY PO), Take 1 tablet by mouth daily.   Calcium  Carb-Cholecalciferol (CHEWABLE CALCIUM /D3 PO), Take 1 tablet by mouth daily.   Coenzyme Q10 (CO Q 10 PO), Take 400 mg by mouth daily.   DULoxetine  (CYMBALTA ) 20 MG capsule, Take 1 capsule (20 mg total) by mouth daily.   gabapentin  (NEURONTIN ) 100 MG capsule, TAKE 3 CAPSULES AT BEDTIME   KLOR-CON  M20 20 MEQ tablet, TAKE 1 TABLET DAILY   Multiple Vitamin (MULTIVITAMIN) tablet, Take 1 tablet by mouth daily.   Omega-3 Fatty Acids (FISH OIL PO), Take 2 g by mouth daily.   pantoprazole  (PROTONIX ) 20 MG tablet, TAKE 1 TABLET TWICE A DAY, 30 MINUTES BEFORE FOOD   polyethylene glycol (MIRALAX  / GLYCOLAX ) 17 g packet, Take 17 g by mouth every other day.   pyridoxine  (B-6) 200 MG tablet, Take 200 mg by mouth daily.   solifenacin  (VESICARE ) 10 MG tablet, Take 1 tablet (10 mg total) by mouth daily.   topiramate  (TOPAMAX ) 25 MG tablet, Take 3 tablets (75 mg total) by mouth at bedtime.   TURMERIC PO, Take 538 mg by mouth daily.   VEVYE 0.1 % SOLN, Apply 1 drop to eye 2 (two) times daily.   Vitamin D , Ergocalciferol , (DRISDOL ) 1.25 MG (50000 UNIT) CAPS capsule, Take 1 capsule (50,000 Units total) by mouth every 7 (seven) days.   vitamin  E 200 UNIT capsule, Take 200 Units by mouth daily.   Reviewed prior external information including notes and imaging from  primary care provider As well as notes that were available from care everywhere and other healthcare systems.  Past medical history, social, surgical and family history all reviewed in electronic medical record.  No pertanent information unless stated regarding to the chief complaint.   Review of Systems:  No headache, visual changes, nausea, vomiting, diarrhea, constipation, dizziness, abdominal pain,  skin rash, fevers, chills, night sweats, weight loss, swollen lymph nodes, body aches, joint swelling, chest pain, shortness of breath, mood changes. POSITIVE muscle aches but very tolerable  Objective  Blood pressure 124/70, pulse 73, height 5\' 4"  (1.626 m), weight 210 lb (95.3 kg), SpO2 96%.   General: No apparent distress alert and oriented x3 mood and affect normal, dressed appropriately.  HEENT: Pupils equal, extraocular movements intact  Respiratory: Patient's speak in full sentences and does not appear short of breath  Cardiovascular: 1+ lower extremity edema, non tender, no erythema  Left arm does have some protruding of the veins noted.  Minorly tender to palpation.  No warmth noted.  Neurovascular intact distally. Knees do have some arthritic changes noted.  No significant swelling.  Patient is doing well and able to get up from a sitting position without any significant difficulty.    Impression and Recommendations:     The above documentation has been reviewed and is accurate and complete Joan Avetisyan M Eriel Dunckel, DO

## 2023-07-20 ENCOUNTER — Ambulatory Visit (INDEPENDENT_AMBULATORY_CARE_PROVIDER_SITE_OTHER): Payer: Medicare Other | Admitting: Family Medicine

## 2023-07-20 VITALS — BP 124/70 | HR 73 | Ht 64.0 in | Wt 210.0 lb

## 2023-07-20 DIAGNOSIS — M170 Bilateral primary osteoarthritis of knee: Secondary | ICD-10-CM | POA: Diagnosis not present

## 2023-07-20 DIAGNOSIS — I808 Phlebitis and thrombophlebitis of other sites: Secondary | ICD-10-CM | POA: Insufficient documentation

## 2023-07-20 DIAGNOSIS — G5711 Meralgia paresthetica, right lower limb: Secondary | ICD-10-CM | POA: Insufficient documentation

## 2023-07-20 MED ORDER — DOXYCYCLINE HYCLATE 100 MG PO TABS: 100.0000 mg | ORAL_TABLET | Freq: Two times a day (BID) | ORAL | 0 refills | Status: DC

## 2023-07-20 NOTE — Assessment & Plan Note (Signed)
 New problem, doxycycline  given worsening pain and further workup necessary but do not think so.

## 2023-07-20 NOTE — Assessment & Plan Note (Signed)
 At moment patient is doing very well.  Discussed icing regimen and home exercises, discussed which activities to do and which ones to.  Increase activity slowly over the course of next several weeks.  Discussed icing regimen.  Follow-up again in 3 months

## 2023-07-20 NOTE — Assessment & Plan Note (Signed)
 Discussed sitting position and avoiding compression in the area.

## 2023-07-20 NOTE — Patient Instructions (Addendum)
 Doxycycline  prescribed Monitor position while on computer so right leg doesn't lean on anything hard Monitor knees See you again in 3 month

## 2023-07-21 ENCOUNTER — Other Ambulatory Visit: Payer: Self-pay | Admitting: Family Medicine

## 2023-07-22 ENCOUNTER — Encounter (INDEPENDENT_AMBULATORY_CARE_PROVIDER_SITE_OTHER): Payer: Self-pay | Admitting: Family Medicine

## 2023-07-22 ENCOUNTER — Ambulatory Visit (INDEPENDENT_AMBULATORY_CARE_PROVIDER_SITE_OTHER): Payer: PRIVATE HEALTH INSURANCE | Admitting: Family Medicine

## 2023-07-22 VITALS — BP 143/79 | HR 55 | Temp 97.9°F | Ht 64.0 in | Wt 205.0 lb

## 2023-07-22 DIAGNOSIS — Z6835 Body mass index (BMI) 35.0-35.9, adult: Secondary | ICD-10-CM | POA: Diagnosis not present

## 2023-07-22 DIAGNOSIS — R030 Elevated blood-pressure reading, without diagnosis of hypertension: Secondary | ICD-10-CM

## 2023-07-22 DIAGNOSIS — E1169 Type 2 diabetes mellitus with other specified complication: Secondary | ICD-10-CM

## 2023-07-22 DIAGNOSIS — E559 Vitamin D deficiency, unspecified: Secondary | ICD-10-CM

## 2023-07-22 DIAGNOSIS — E669 Obesity, unspecified: Secondary | ICD-10-CM

## 2023-07-22 DIAGNOSIS — E876 Hypokalemia: Secondary | ICD-10-CM | POA: Diagnosis not present

## 2023-07-22 DIAGNOSIS — Z6841 Body Mass Index (BMI) 40.0 and over, adult: Secondary | ICD-10-CM

## 2023-07-22 MED ORDER — VITAMIN D (ERGOCALCIFEROL) 1.25 MG (50000 UNIT) PO CAPS: 50000.0000 [IU] | ORAL_CAPSULE | ORAL | 0 refills | Status: DC

## 2023-07-22 NOTE — Progress Notes (Signed)
 Office: [phone]  /  Fax: [phone]  WEIGHT SUMMARY AND BIOMETRICS  Anthropometric Measurements Height: 5\' 4"  (1.626 m) Weight: 205 lb (93 kg) BMI (Calculated): 35.17 Weight at Last Visit: 203 lb Weight Lost Since Last Visit: 0 Weight Gained Since Last Visit: 2 lb Starting Weight: 268 lb Total Weight Loss (lbs): 63 lb (28.6 kg) Peak Weight: 268 lb   Body Composition  Body Fat %: 48.9 % Fat Mass (lbs): 100.6 lbs Muscle Mass (lbs): 99.6 lbs Total Body Water (lbs): 83.2 lbs Visceral Fat Rating : 16   Other Clinical Data Fasting: no Labs: no Today's Visit #: 94 Starting Date: 12/23/16    Chief Complaint: OBESITY   History of Present Illness Heather [name] is a 78 year old female who presents for obesity treatment.  She is alternating between a category two eating plan and a journaling plan of 1200 calories with 75 or more grams of protein, adhering to one or the other about 50% of the time. Despite these efforts, she has gained two pounds in the last month. She engages in physical activity, including chair exercises and using an elliptical for about 30 minutes twice a week.  Her blood pressure readings in the office were elevated at 142/77 and 143/79. However, she reports consistently lower readings at home, which she describes as 'consistently low' and sometimes 'questionably low,' though not below 110 for the systolic number.  She is currently taking an antibiotic for superficial phlebitis, which developed after a recent blood draw. She experiences soreness in the arm where blood was drawn.  Her recent labs showed a vitamin D  level in the 80s, and she is taking vitamin D  supplements. Her potassium was noted to be slightly low last month, and she has been consuming potassium-rich foods such as clementines, orange juice, bananas, and beans to address this.  She enjoys time with family and friends, including a visit with her 86 year old aunt and attending church  events. She acknowledges occasional dietary indulgences, such as during Mother's Day and Memorial Day, but is mindful of her overall dietary habits.      PHYSICAL EXAM:  Blood pressure (!) 143/79, pulse (!) 55, temperature 97.9 F (36.6 C), height 5\' 4"  (1.626 m), weight 205 lb (93 kg), SpO2 98%. Body mass index is 35.19 kg/m.  DIAGNOSTIC DATA REVIEWED:  BMET    Component Value Date/Time   NA 138 06/12/2023 1250   NA 144 10/28/2022 1159   K 3.4 (L) 06/12/2023 1250   CL 102 06/12/2023 1250   CO2 25 06/12/2023 1250   GLUCOSE 85 06/12/2023 1250   BUN 13 06/12/2023 1250   BUN 17 10/28/2022 1159   CREATININE 0.76 06/12/2023 1250   CREATININE 0.80 12/07/2019 1246   CREATININE 0.81 12/28/2011 0853   CALCIUM  8.8 (L) 06/12/2023 1250   GFRNONAA >60 06/12/2023 1250   GFRNONAA >60 12/07/2019 1246   GFRAA 100 10/09/2019 1425   Lab Results  Component Value Date   HGBA1C 5.7 05/25/2023   HGBA1C 6.2 (H) 10/06/2006   Lab Results  Component Value Date   INSULIN  6.0 11/25/2020   INSULIN  20.9 12/23/2016   Lab Results  Component Value Date   TSH 0.62 05/26/2022   CBC    Component Value Date/Time   WBC 3.4 (L) 06/12/2023 1250   RBC 3.98 06/12/2023 1250   HGB 12.6 06/12/2023 1250   HGB 12.0 04/14/2021 1140   HCT 39.5 06/12/2023 1250   HCT 38.3 04/14/2021 1140   PLT 151  06/12/2023 1250   PLT 238 04/14/2021 1140   MCV 99.2 06/12/2023 1250   MCV 94 04/14/2021 1140   MCH 31.7 06/12/2023 1250   MCHC 31.9 06/12/2023 1250   RDW 14.6 06/12/2023 1250   RDW 13.8 04/14/2021 1140   Iron Studies    Component Value Date/Time   IRON 71 05/25/2023 1441   IRON 42 04/14/2021 1140   TIBC 228.2 (L) 05/25/2023 1441   TIBC 231 (L) 04/14/2021 1140   FERRITIN 153.9 05/25/2023 1441   FERRITIN 338 (H) 04/14/2021 1140   IRONPCTSAT 31.1 05/25/2023 1441   IRONPCTSAT 18 04/14/2021 1140   IRONPCTSAT 17 04/26/2018 1602   Lipid Panel     Component Value Date/Time   CHOL 142 06/02/2023 1242    CHOL 149 11/25/2020 1131   TRIG 69 06/02/2023 1242   HDL 69 06/02/2023 1242   HDL 62 11/25/2020 1131   CHOLHDL 2.1 06/02/2023 1242   VLDL 14 06/02/2023 1242   LDLCALC 59 06/02/2023 1242   LDLCALC 73 11/25/2020 1131   Hepatic Function Panel     Component Value Date/Time   PROT 7.2 05/25/2023 1441   PROT 7.0 10/28/2022 1159   ALBUMIN 4.0 05/25/2023 1441   ALBUMIN 4.0 10/28/2022 1159   AST 23 05/25/2023 1441   AST 19 12/07/2019 1246   ALT 16 05/25/2023 1441   ALT 20 12/07/2019 1246   ALKPHOS 62 05/25/2023 1441   BILITOT 0.3 05/25/2023 1441   BILITOT 0.2 10/28/2022 1159   BILITOT 0.3 12/07/2019 1246   BILIDIR 0.1 02/15/2015 1010   IBILI 0.2 12/28/2011 0853      Component Value Date/Time   TSH 0.62 05/26/2022 1552   Nutritional Lab Results  Component Value Date   VD25OH 89.5 10/28/2022   VD25OH 48.25 11/21/2021   VD25OH 55.3 04/14/2021     Assessment and Plan Assessment & Plan Obesity Adherence to a category two eating plan and a 1200-calorie journaling plan with 75+ grams of protein is approximately 50%. Engages in physical activity with chair exercises and elliptical twice weekly. Weight increased by 2 pounds in the last month. Discussed balancing life enjoyment with dietary goals. - Continue category two eating plan - Encourage increased adherence to dietary plan - Encourage regular physical activity  Elevated blood pressure Office blood pressure readings were elevated at 142/77 and 143/79. Home readings are reportedly lower and more consistent, suggesting potential white coat hypertension. Reassured by lower home readings despite office elevations. - Continue monitoring blood pressure at home - Send home blood pressure readings via MyChart  Hypokalemia Recent labs showed slightly low potassium. Consuming potassium-rich foods such as clementines, orange juice, bananas, and beans. Will recheck levels to ensure normalization. - Order repeat potassium level -  Encourage continued consumption of potassium-rich foods  Vitamin D  deficiency Previous vitamin D  level was in the 80s, higher than the desired range of 50-60. Risk of vitamin D  toxicity if levels exceed 100. Will monitor levels to prevent potential complications. - Order vitamin D  level - Refill vitamin D  prescription - Instruct to stop vitamin D  if levels are too high (>100)   She was informed of the importance of frequent follow up visits to maximize her success with intensive lifestyle modifications for her multiple health conditions.    Jasmine Mesi, MD

## 2023-07-23 LAB — CMP14+EGFR
ALT: 24 IU/L (ref 0–32)
AST: 25 IU/L (ref 0–40)
Albumin: 4.1 g/dL (ref 3.8–4.8)
Alkaline Phosphatase: 65 IU/L (ref 44–121)
BUN/Creatinine Ratio: 18 (ref 12–28)
BUN: 14 mg/dL (ref 8–27)
Bilirubin Total: 0.2 mg/dL (ref 0.0–1.2)
CO2: 21 mmol/L (ref 20–29)
Calcium: 9.1 mg/dL (ref 8.7–10.3)
Chloride: 107 mmol/L — ABNORMAL HIGH (ref 96–106)
Creatinine, Ser: 0.77 mg/dL (ref 0.57–1.00)
Globulin, Total: 3 g/dL (ref 1.5–4.5)
Glucose: 71 mg/dL (ref 70–99)
Potassium: 4.8 mmol/L (ref 3.5–5.2)
Sodium: 143 mmol/L (ref 134–144)
Total Protein: 7.1 g/dL (ref 6.0–8.5)
eGFR: 80 mL/min/{1.73_m2} (ref >59)

## 2023-07-23 LAB — VITAMIN D 25 HYDROXY (VIT D DEFICIENCY, FRACTURES): Vit D, 25-Hydroxy: 83.2 ng/mL (ref 30.0–100.0)

## 2023-07-23 LAB — HEMOGLOBIN A1C
Est. average glucose Bld gHb Est-mCnc: 114 mg/dL
Hgb A1c MFr Bld: 5.6 % (ref 4.8–5.6)

## 2023-07-23 LAB — INSULIN, RANDOM: INSULIN: 7.3 u[IU]/mL (ref 2.6–24.9)

## 2023-08-05 ENCOUNTER — Other Ambulatory Visit: Payer: Self-pay | Admitting: Surgery

## 2023-08-05 DIAGNOSIS — K432 Incisional hernia without obstruction or gangrene: Secondary | ICD-10-CM

## 2023-08-16 ENCOUNTER — Ambulatory Visit
Admission: RE | Admit: 2023-08-16 | Discharge: 2023-08-16 | Disposition: A | Discharge status: Home / Self Care | Source: Ambulatory Visit | Attending: Surgery

## 2023-08-16 DIAGNOSIS — K449 Diaphragmatic hernia without obstruction or gangrene: Secondary | ICD-10-CM | POA: Diagnosis not present

## 2023-08-16 DIAGNOSIS — K429 Umbilical hernia without obstruction or gangrene: Secondary | ICD-10-CM | POA: Diagnosis not present

## 2023-08-16 DIAGNOSIS — K5730 Diverticulosis of large intestine without perforation or abscess without bleeding: Secondary | ICD-10-CM | POA: Diagnosis not present

## 2023-08-16 DIAGNOSIS — K432 Incisional hernia without obstruction or gangrene: Secondary | ICD-10-CM

## 2023-08-16 DIAGNOSIS — D259 Leiomyoma of uterus, unspecified: Secondary | ICD-10-CM | POA: Diagnosis not present

## 2023-08-16 MED ORDER — IOPAMIDOL (ISOVUE-300) INJECTION 61%
75.0000 mL | Freq: Once | INTRAVENOUS | Status: AC | PRN
  Administered 2023-08-16: 75 mL via INTRAVENOUS

## 2023-08-19 ENCOUNTER — Ambulatory Visit (INDEPENDENT_AMBULATORY_CARE_PROVIDER_SITE_OTHER): Admitting: Family Medicine

## 2023-08-19 ENCOUNTER — Encounter (INDEPENDENT_AMBULATORY_CARE_PROVIDER_SITE_OTHER): Payer: Self-pay | Admitting: Family Medicine

## 2023-08-19 VITALS — BP 129/58 | HR 69 | Temp 97.7°F | Ht 64.0 in | Wt 210.0 lb

## 2023-08-19 DIAGNOSIS — F439 Reaction to severe stress, unspecified: Secondary | ICD-10-CM

## 2023-08-19 DIAGNOSIS — I10 Essential (primary) hypertension: Secondary | ICD-10-CM | POA: Diagnosis not present

## 2023-08-19 DIAGNOSIS — E669 Obesity, unspecified: Secondary | ICD-10-CM

## 2023-08-19 DIAGNOSIS — E559 Vitamin D deficiency, unspecified: Secondary | ICD-10-CM | POA: Diagnosis not present

## 2023-08-19 DIAGNOSIS — K449 Diaphragmatic hernia without obstruction or gangrene: Secondary | ICD-10-CM | POA: Diagnosis not present

## 2023-08-19 DIAGNOSIS — Z6836 Body mass index (BMI) 36.0-36.9, adult: Secondary | ICD-10-CM

## 2023-08-19 NOTE — Progress Notes (Signed)
 Office: [phone]  /  Fax: [phone]  WEIGHT SUMMARY AND BIOMETRICS  Anthropometric Measurements Height: 5' 4 (1.626 m) Weight: 210 lb (95.3 kg) BMI (Calculated): 36.03 Weight at Last Visit: 205 lb Weight Lost Since Last Visit: 0 Weight Gained Since Last Visit: 5 lb Starting Weight: 268 lb Total Weight Loss (lbs): 58 lb (26.3 kg) Peak Weight: 268 lb   Body Composition  Body Fat %: 49.4 % Fat Mass (lbs): 103.8 lbs Muscle Mass (lbs): 101 lbs Total Body Water (lbs): 83.6 lbs Visceral Fat Rating : 16   Other Clinical Data Fasting: no Labs: no Today's Visit #: 95 Starting Date: 12/23/16    Chief Complaint: OBESITY   History of Present Illness Heather [name] is a 78 year old female who presents for obesity treatment and progress assessment.  She follows her category two eating plan only about 25% of the time and has gained five pounds in the last month. She attributes this to overeating, particularly during social events such as birthday parties and family gatherings, where she finds it difficult to control portion sizes and resist desserts.  She engages in a variety of physical activities for about thirty minutes, four times per week. Despite these efforts, she struggles with maintaining her weight due to temptations and cravings, which are exacerbated by stress and social situations.  Her stress level is affected by her umbilical hernia and extreme hiatal hernia, present for over fifteen years. She experiences significant pain when the hernia acts up, describing it as 'when the pain hits, it rips'. Currently, she reports no pain from her hernias.  She tries to make healthier choices, such as avoiding certain breakfast foods when her daughter and husband are present, and opting for soup instead. She continues to eat salads and has managed to avoid ice cream despite having it in the house for her grandchildren.  She tracks her blood pressure and reports occasional  readings in the 90s. She has stopped taking vitamin D .      PHYSICAL EXAM:  Blood pressure (!) 129/58, pulse 69, temperature 97.7 F (36.5 C), height 5' 4 (1.626 m), weight 210 lb (95.3 kg), SpO2 97%. Body mass index is 36.05 kg/m.  DIAGNOSTIC DATA REVIEWED:  BMET    Component Value Date/Time   NA 143 07/22/2023 1144   K 4.8 07/22/2023 1144   CL 107 (H) 07/22/2023 1144   CO2 21 07/22/2023 1144   GLUCOSE 71 07/22/2023 1144   GLUCOSE 85 06/12/2023 1250   BUN 14 07/22/2023 1144   CREATININE 0.77 07/22/2023 1144   CREATININE 0.80 12/07/2019 1246   CREATININE 0.81 12/28/2011 0853   CALCIUM  9.1 07/22/2023 1144   GFRNONAA >60 06/12/2023 1250   GFRNONAA >60 12/07/2019 1246   GFRAA 100 10/09/2019 1425   Lab Results  Component Value Date   HGBA1C 5.6 07/22/2023   HGBA1C 6.2 (H) 10/06/2006   Lab Results  Component Value Date   INSULIN  7.3 07/22/2023   INSULIN  20.9 12/23/2016   Lab Results  Component Value Date   TSH 0.62 05/26/2022   CBC    Component Value Date/Time   WBC 3.4 (L) 06/12/2023 1250   RBC 3.98 06/12/2023 1250   HGB 12.6 06/12/2023 1250   HGB 12.0 04/14/2021 1140   HCT 39.5 06/12/2023 1250   HCT 38.3 04/14/2021 1140   PLT 151 06/12/2023 1250   PLT 238 04/14/2021 1140   MCV 99.2 06/12/2023 1250   MCV 94 04/14/2021 1140   MCH 31.7 06/12/2023  1250   MCHC 31.9 06/12/2023 1250   RDW 14.6 06/12/2023 1250   RDW 13.8 04/14/2021 1140   Iron Studies    Component Value Date/Time   IRON 71 05/25/2023 1441   IRON 42 04/14/2021 1140   TIBC 228.2 (L) 05/25/2023 1441   TIBC 231 (L) 04/14/2021 1140   FERRITIN 153.9 05/25/2023 1441   FERRITIN 338 (H) 04/14/2021 1140   IRONPCTSAT 31.1 05/25/2023 1441   IRONPCTSAT 18 04/14/2021 1140   IRONPCTSAT 17 04/26/2018 1602   Lipid Panel     Component Value Date/Time   CHOL 142 06/02/2023 1242   CHOL 149 11/25/2020 1131   TRIG 69 06/02/2023 1242   HDL 69 06/02/2023 1242   HDL 62 11/25/2020 1131   CHOLHDL 2.1  06/02/2023 1242   VLDL 14 06/02/2023 1242   LDLCALC 59 06/02/2023 1242   LDLCALC 73 11/25/2020 1131   Hepatic Function Panel     Component Value Date/Time   PROT 7.1 07/22/2023 1144   ALBUMIN 4.1 07/22/2023 1144   AST 25 07/22/2023 1144   AST 19 12/07/2019 1246   ALT 24 07/22/2023 1144   ALT 20 12/07/2019 1246   ALKPHOS 65 07/22/2023 1144   BILITOT 0.2 07/22/2023 1144   BILITOT 0.3 12/07/2019 1246   BILIDIR 0.1 02/15/2015 1010   IBILI 0.2 12/28/2011 0853      Component Value Date/Time   TSH 0.62 05/26/2022 1552   Nutritional Lab Results  Component Value Date   VD25OH 83.2 07/22/2023   VD25OH 89.5 10/28/2022   VD25OH 48.25 11/21/2021     Assessment and Plan Assessment & Plan Obesity She is struggling with weight management, having gained five pounds in the last month. She adheres to her category two eating plan only 25% of the time and exercises for 30 minutes four times per week. Stress and social events contribute to overeating. Positive changes include avoiding certain breakfast foods and ice cream. A low-carb plan was provided to jumpstart weight loss, emphasizing the need for 99% adherence to prevent insulin  response and promote fat burning. Alternatives include journaling or returning to the category two plan if the low-carb plan is unsustainable. - Provide low-carb eating plan for weight loss - Encourage journaling to track food intake and progress - Goal of 150 minutes per week of exercise   Hiatal Hernia She has an extreme hiatal hernia present for over 15 years, currently asymptomatic with no pain. Stress is reported related to the condition. Surgery is not recommended unless the hernia becomes painful, as it is too severe for surgery at this time. - Monitor for symptoms of pain or discomfort - Avoid surgical intervention unless symptoms worsen  Hypertension Blood pressure is well-controlled, averaging 120/60-70 mmHg, with occasional fluctuations that quickly  normalize. - Continue current blood pressure management - Monitor blood pressure regularly - Continue diet, exercise and weight loss to help control BP  Vitamin D  Deficiency Vitamin D  levels are good, but supplementation is held to prevent excessive levels and tissue deposition. Plan to recheck vitamin D  levels at the end of summer. - Hold vitamin D  supplementation - Recheck vitamin D  levels at the end of summer    She was informed of the importance of frequent follow up visits to maximize her success with intensive lifestyle modifications for her multiple health conditions.    Jasmine Mesi, MD

## 2023-08-22 ENCOUNTER — Encounter (INDEPENDENT_AMBULATORY_CARE_PROVIDER_SITE_OTHER): Payer: Self-pay | Admitting: Family Medicine

## 2023-09-12 ENCOUNTER — Encounter: Payer: Self-pay | Admitting: Family Medicine

## 2023-09-21 NOTE — Progress Notes (Unsigned)
 Ben Jackson D[facility] [address] [place] [address] Phone: [phone]   Assessment and Plan:     There are no diagnoses linked to this encounter.  ***   Pertinent previous records reviewed include ***    Follow Up: ***     Subjective:   I, Heather [name], am serving as a Neurosurgeon for Doctor Morene Mace  Chief Complaint: right thigh pain   HPI:   09/22/2023 Patient is a 78 year old female with right thigh pain.Patient states   Relevant Historical Information: ***  Additional pertinent review of systems negative.   Current Outpatient Medications:    acetaminophen  (TYLENOL ) 500 MG tablet, Take 1,000 mg by mouth 3 (three) times daily as needed for moderate pain., Disp: , Rfl:    Boswellia-Glucosamine-Vit D (OSTEO BI-FLEX ONE PER DAY PO), Take 1 tablet by mouth daily., Disp: , Rfl:    Calcium  Carb-Cholecalciferol (CHEWABLE CALCIUM /D3 PO), Take 1 tablet by mouth daily., Disp: , Rfl:    cetirizine (ZYRTEC) 10 MG tablet, Take 10 mg by mouth daily., Disp: , Rfl:    clopidogrel  (PLAVIX ) 75 MG tablet, Take 1 tablet (75 mg total) by mouth daily., Disp: 90 tablet, Rfl: 3   Coenzyme Q10 (CO Q 10 PO), Take 400 mg by mouth daily., Disp: , Rfl:    Cyanocobalamin  1000 MCG CAPS, Take 1 capsule by mouth daily., Disp: , Rfl:    doxycycline  (VIBRA -TABS) 100 MG tablet, Take 1 tablet (100 mg total) by mouth 2 (two) times daily., Disp: 14 tablet, Rfl: 0   DULoxetine  (CYMBALTA ) 20 MG capsule, TAKE 1 CAPSULE DAILY, Disp: 90 capsule, Rfl: 3   ezetimibe  (ZETIA ) 10 MG tablet, Take 1 tablet (10 mg total) by mouth daily., Disp: 90 tablet, Rfl: 3   Ferrous Sulfate  (IRON) 325 (65 FE) MG TABS, Take 1 tablet by mouth daily., Disp: , Rfl:    furosemide  (LASIX ) 20 MG tablet, TAKE 1 TABLET TWICE A DAY (Patient taking differently: Take 20 mg by mouth daily.), Disp: 180 tablet, Rfl: 3   gabapentin  (NEURONTIN ) 100 MG capsule, TAKE 3 CAPSULES AT BEDTIME, Disp:  270 capsule, Rfl: 3   KLOR-CON  M20 20 MEQ tablet, TAKE 1 TABLET DAILY, Disp: 90 tablet, Rfl: 3   Multiple Vitamin (MULTIVITAMIN) tablet, Take 1 tablet by mouth daily., Disp: , Rfl:    naproxen sodium (ALEVE) 220 MG tablet, Take 220 mg by mouth as needed., Disp: , Rfl:    Omega-3 Fatty Acids (FISH OIL PO), Take 2 g by mouth daily., Disp: , Rfl:    pantoprazole  (PROTONIX ) 20 MG tablet, TAKE 1 TABLET TWICE A DAY, 30 MINUTES BEFORE FOOD, Disp: 180 tablet, Rfl: 3   polyethylene glycol (MIRALAX  / GLYCOLAX ) 17 g packet, Take 17 g by mouth every other day., Disp: , Rfl:    pyridoxine  (B-6) 200 MG tablet, Take 200 mg by mouth daily., Disp: , Rfl:    rosuvastatin  (CRESTOR ) 40 MG tablet, TAKE 1 TABLET DAILY AT NIGHT, Disp: 90 tablet, Rfl: 3   solifenacin  (VESICARE ) 10 MG tablet, Take 1 tablet (10 mg total) by mouth daily., Disp: 90 tablet, Rfl: 3   topiramate  (TOPAMAX ) 25 MG tablet, Take 3 tablets (75 mg total) by mouth at bedtime., Disp: 270 tablet, Rfl: 3   traMADol  (ULTRAM ) 50 MG tablet, Take 1 tablet (50 mg total) by mouth 2 (two) times daily as needed., Disp: 30 tablet, Rfl: 2   TURMERIC PO, Take 538 mg by mouth daily.,  Disp: , Rfl:    VEVYE 0.1 % SOLN, Apply 1 drop to eye 2 (two) times daily., Disp: , Rfl:    Vitamin D , Ergocalciferol , (DRISDOL ) 1.25 MG (50000 UNIT) CAPS capsule, Take 1 capsule (50,000 Units total) by mouth every 7 (seven) days., Disp: 13 capsule, Rfl: 0   vitamin E  200 UNIT capsule, Take 200 Units by mouth daily., Disp: , Rfl:    Objective:     There were no vitals filed for this visit.    There is no height or weight on file to calculate BMI.    Physical Exam:    ***   Electronically signed by:  Odis Mace [facility] 9:45 AM 09/21/23

## 2023-09-22 ENCOUNTER — Ambulatory Visit (INDEPENDENT_AMBULATORY_CARE_PROVIDER_SITE_OTHER): Admitting: Sports Medicine

## 2023-09-22 VITALS — BP 130/82 | Ht 64.0 in | Wt 207.0 lb

## 2023-09-22 DIAGNOSIS — R202 Paresthesia of skin: Secondary | ICD-10-CM | POA: Diagnosis not present

## 2023-09-22 DIAGNOSIS — R200 Anesthesia of skin: Secondary | ICD-10-CM

## 2023-09-22 DIAGNOSIS — G5711 Meralgia paresthetica, right lower limb: Secondary | ICD-10-CM

## 2023-09-22 MED ORDER — MELOXICAM 15 MG PO TABS: 15.0000 mg | ORAL_TABLET | Freq: Every day | ORAL | 0 refills | Status: DC

## 2023-09-22 NOTE — Patient Instructions (Signed)
-   Start meloxicam  15 mg daily x2 weeks. May use remaining NSAID as needed once daily for pain control.  Do not to use additional over-the-counter NSAIDs (ibuprofen, naproxen, Advil, Aleve, etc.) while taking prescription NSAIDs.  May use Tylenol  [phone] mg 2 to 3 times a day for breakthrough pain. Recommend Voltaren gel over hip  Hip HEP  Keep appointment with Dr. Claudene

## 2023-09-28 ENCOUNTER — Telehealth: Payer: Self-pay | Admitting: Internal Medicine

## 2023-09-28 ENCOUNTER — Ambulatory Visit (INDEPENDENT_AMBULATORY_CARE_PROVIDER_SITE_OTHER): Admitting: Family Medicine

## 2023-09-28 ENCOUNTER — Encounter (INDEPENDENT_AMBULATORY_CARE_PROVIDER_SITE_OTHER): Payer: Self-pay | Admitting: Family Medicine

## 2023-09-28 VITALS — BP 149/78 | HR 57 | Temp 98.1°F | Ht 64.0 in | Wt 201.0 lb

## 2023-09-28 DIAGNOSIS — Z6834 Body mass index (BMI) 34.0-34.9, adult: Secondary | ICD-10-CM

## 2023-09-28 DIAGNOSIS — I10 Essential (primary) hypertension: Secondary | ICD-10-CM

## 2023-09-28 DIAGNOSIS — G5711 Meralgia paresthetica, right lower limb: Secondary | ICD-10-CM | POA: Diagnosis not present

## 2023-09-28 DIAGNOSIS — E669 Obesity, unspecified: Secondary | ICD-10-CM

## 2023-09-28 DIAGNOSIS — R030 Elevated blood-pressure reading, without diagnosis of hypertension: Secondary | ICD-10-CM | POA: Diagnosis not present

## 2023-09-28 NOTE — Telephone Encounter (Signed)
 Copied from CRM [id number]. Topic: Medical Record Request - Other >> Sep 17, 2023 10:51 AM [place] J wrote: Reason for CRM: Crystal calling from [facility] wanting to see if a fax was received. I let her know that I do not see the document in the patient's chart. She will be re-faxing the documents today.  >> Sep 27, 2023  4:03 PM [place] E wrote: Crystal form [employer] called to see if documents that she faxed over were received. Initially faxed on 7/8 and most recently faxed on 7/23. Callback number for Crystal is [phone] (secure line).

## 2023-09-28 NOTE — Progress Notes (Signed)
 Office: [phone]  /  Fax: [phone]  WEIGHT SUMMARY AND BIOMETRICS  Anthropometric Measurements Height: 5' 4 (1.626 m) Weight: 201 lb (91.2 kg) BMI (Calculated): 34.48 Weight at Last Visit: 210 lb Weight Lost Since Last Visit: 9 lb Weight Gained Since Last Visit: 0 Starting Weight: 268 lb Total Weight Loss (lbs): 67 lb (30.4 kg) Peak Weight: 268 lb   Body Composition  Body Fat %: 47.1 % Fat Mass (lbs): 95 lbs Muscle Mass (lbs): 101.4 lbs Total Body Water (lbs): 82.2 lbs Visceral Fat Rating : 15   Other Clinical Data Fasting: no Labs: no Today's Visit #: 96 Starting Date: 12/23/16    Chief Complaint: OBESITY    History of Present Illness Heather [name] is a 78 year old female who presents for obesity treatment and progress assessment.  She has been adhering to a low-carb diet 97% of the time, resulting in a weight loss of nine pounds over the past five weeks. She exercises most days of the week for 30 to 40 minutes. Her diet includes high-protein foods such as salmon and [place], while avoiding tomatoes, fruit, bread, and potatoes.  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 Treatment with meloxicam  and an arthritis ointment has been effective in alleviating symptoms. She also performs exercises to manage the condition.  She is managing her hernias, which do not cause discomfort but have been noted by her daughter to affect her appearance. She is focusing on weight loss and protein intake to prepare for potential surgical intervention.      PHYSICAL EXAM:  Blood pressure (!) 149/78, pulse (!) 57, temperature 98.1 F (36.7 C), height 5' 4 (1.626 m), weight 201 lb (91.2 kg), SpO2 98%. Body mass index is 34.5 kg/m.  DIAGNOSTIC DATA REVIEWED:  BMET    Component Value Date/Time   NA 143 07/22/2023 1144   K 4.8 07/22/2023 1144   CL 107 (H) 07/22/2023 1144   CO2 21 07/22/2023  1144   GLUCOSE 71 07/22/2023 1144   GLUCOSE 85 06/12/2023 1250   BUN 14 07/22/2023 1144   CREATININE 0.77 07/22/2023 1144   CREATININE 0.80 12/07/2019 1246   CREATININE 0.81 12/28/2011 0853   CALCIUM  9.1 07/22/2023 1144   GFRNONAA >60 06/12/2023 1250   GFRNONAA >60 12/07/2019 1246   GFRAA 100 10/09/2019 1425   Lab Results  Component Value Date   HGBA1C 5.6 07/22/2023   HGBA1C 6.2 (H) 10/06/2006   Lab Results  Component Value Date   INSULIN  7.3 07/22/2023   INSULIN  20.9 12/23/2016   Lab Results  Component Value Date   TSH 0.62 05/26/2022   CBC    Component Value Date/Time   WBC 3.4 (L) 06/12/2023 1250   RBC 3.98 06/12/2023 1250   HGB 12.6 06/12/2023 1250   HGB 12.0 04/14/2021 1140   HCT 39.5 06/12/2023 1250   HCT 38.3 04/14/2021 1140   PLT 151 06/12/2023 1250   PLT 238 04/14/2021 1140   MCV 99.2 06/12/2023 1250   MCV 94 04/14/2021 1140   MCH 31.7 06/12/2023 1250   MCHC 31.9 06/12/2023 1250   RDW 14.6 06/12/2023 1250   RDW 13.8 04/14/2021 1140   Iron Studies    Component Value Date/Time   IRON 71 05/25/2023 1441   IRON 42 04/14/2021 1140   TIBC 228.2 (L) 05/25/2023 1441   TIBC 231 (L) 04/14/2021 1140   FERRITIN 153.9 05/25/2023 1441   FERRITIN 338 (  H) 04/14/2021 1140   IRONPCTSAT 31.1 05/25/2023 1441   IRONPCTSAT 18 04/14/2021 1140   IRONPCTSAT 17 04/26/2018 1602   Lipid Panel     Component Value Date/Time   CHOL 142 06/02/2023 1242   CHOL 149 11/25/2020 1131   TRIG 69 06/02/2023 1242   HDL 69 06/02/2023 1242   HDL 62 11/25/2020 1131   CHOLHDL 2.1 06/02/2023 1242   VLDL 14 06/02/2023 1242   LDLCALC 59 06/02/2023 1242   LDLCALC 73 11/25/2020 1131   Hepatic Function Panel     Component Value Date/Time   PROT 7.1 07/22/2023 1144   ALBUMIN 4.1 07/22/2023 1144   AST 25 07/22/2023 1144   AST 19 12/07/2019 1246   ALT 24 07/22/2023 1144   ALT 20 12/07/2019 1246   ALKPHOS 65 07/22/2023 1144   BILITOT 0.2 07/22/2023 1144   BILITOT 0.3 12/07/2019  1246   BILIDIR 0.1 02/15/2015 1010   IBILI 0.2 12/28/2011 0853      Component Value Date/Time   TSH 0.62 05/26/2022 1552   Nutritional Lab Results  Component Value Date   VD25OH 83.2 07/22/2023   VD25OH 89.5 10/28/2022   VD25OH 48.25 11/21/2021     Assessment and Plan Assessment & Plan Obesity She has been following a low-carb diet successfully 97% of the time and has lost 9 pounds in the last 5 weeks. She is exercising most days of the week for 30 to 40 minutes. She is considering transitioning to a less restrictive diet (category two) to include some foods she has been avoiding, such as tomatoes, in preparation for an upcoming cruise. She has maintained muscle mass and lost 7 pounds of fat. - Transition to category two diet, allowing for some previously restricted foods like tomatoes. - Continue regular exercise regimen. - Follow up after cruise in 4-5 weeks  Meralgia paresthetica Reports tingling in the thigh, diagnosed as meralgia paresthetica. Prescribed meloxicam  for 14 days and an arthritis ointment, which have been effective in alleviating symptoms. Exercises were recommended to manage the condition. - Continue meloxicam  for 14 days. - Apply arthritis ointment as needed. - Perform recommended exercises to alleviate symptoms.  Elevated blood pressure (office) Blood pressure readings in the office were elevated at 145/70 and 149/78. However, home readings are within normal range, typically in the 120s/130s over 70s. Not concerned about the office readings given the normal home measurements. - Continue monitoring blood pressure at home. - Continue working on diet, exercise and weight loss to improve      She was informed of the importance of frequent follow up visits to maximize her success with intensive lifestyle modifications for her multiple health conditions.    Louann Penton, MD

## 2023-09-29 NOTE — Telephone Encounter (Signed)
 Form received however we will not be completing.  Form is asking for additional testing that looks suspicious.

## 2023-10-06 ENCOUNTER — Other Ambulatory Visit (HOSPITAL_COMMUNITY): Payer: Self-pay | Admitting: Cardiology

## 2023-10-07 ENCOUNTER — Ambulatory Visit: Admitting: Podiatry

## 2023-10-07 DIAGNOSIS — M79676 Pain in unspecified toe(s): Secondary | ICD-10-CM | POA: Diagnosis not present

## 2023-10-07 DIAGNOSIS — D689 Coagulation defect, unspecified: Secondary | ICD-10-CM | POA: Diagnosis not present

## 2023-10-07 DIAGNOSIS — B351 Tinea unguium: Secondary | ICD-10-CM

## 2023-10-07 DIAGNOSIS — D2372 Other benign neoplasm of skin of left lower limb, including hip: Secondary | ICD-10-CM

## 2023-10-07 DIAGNOSIS — D2371 Other benign neoplasm of skin of right lower limb, including hip: Secondary | ICD-10-CM

## 2023-10-07 NOTE — Progress Notes (Signed)
 She presents today chief complaint of painful elongated toenails 1 through 5 bilateral.  Also painful calluses.  Objective: Pulses are palpable no open lesions or wounds are noted.  Skin is supple and well-hydrated toenails are thick yellow dystrophic clinically mycotic with benign skin lesions plantar aspect of the forefoot.  Assessment: Pain limb secondary onychomycosis pain limb secondary to benign skin lesions.  Plan: Debridement of toenails 1 through 5 bilateral debridement benign skin lesions.

## 2023-10-10 ENCOUNTER — Other Ambulatory Visit: Payer: Self-pay | Admitting: Family Medicine

## 2023-10-20 ENCOUNTER — Ambulatory Visit: Admitting: Family Medicine

## 2023-10-26 ENCOUNTER — Ambulatory Visit (INDEPENDENT_AMBULATORY_CARE_PROVIDER_SITE_OTHER): Admitting: Family Medicine

## 2023-10-26 ENCOUNTER — Encounter (INDEPENDENT_AMBULATORY_CARE_PROVIDER_SITE_OTHER): Payer: Self-pay | Admitting: Family Medicine

## 2023-10-26 ENCOUNTER — Ambulatory Visit (INDEPENDENT_AMBULATORY_CARE_PROVIDER_SITE_OTHER): Payer: PRIVATE HEALTH INSURANCE | Admitting: Family Medicine

## 2023-10-26 VITALS — BP 136/71 | HR 68 | Temp 98.6°F | Ht 64.0 in | Wt 200.0 lb

## 2023-10-26 DIAGNOSIS — E669 Obesity, unspecified: Secondary | ICD-10-CM

## 2023-10-26 DIAGNOSIS — Z6834 Body mass index (BMI) 34.0-34.9, adult: Secondary | ICD-10-CM | POA: Diagnosis not present

## 2023-10-26 DIAGNOSIS — E559 Vitamin D deficiency, unspecified: Secondary | ICD-10-CM

## 2023-10-26 DIAGNOSIS — Z6841 Body Mass Index (BMI) 40.0 and over, adult: Secondary | ICD-10-CM

## 2023-10-26 NOTE — Progress Notes (Signed)
 Office: [phone]  /  Fax: [phone]  WEIGHT SUMMARY AND BIOMETRICS  Anthropometric Measurements Height: 5' 4 (1.626 m) Weight: 200 lb (90.7 kg) BMI (Calculated): 34.31 Weight at Last Visit: 201 lb Weight Lost Since Last Visit: 1 lb Weight Gained Since Last Visit: 0 Starting Weight: 268 lb Total Weight Loss (lbs): 68 lb (30.8 kg) Peak Weight: 268 lb   Body Composition  Body Fat %: 45.5 % Fat Mass (lbs): 91 lbs Muscle Mass (lbs): 103.4 lbs Total Body Water (lbs): 81 lbs Visceral Fat Rating : 14   Other Clinical Data Fasting: no Labs: no Today's Visit #: 97 Starting Date: 12/23/16    Chief Complaint: OBESITY    History of Present Illness Heather [name] is a 78 year old female who presents for obesity treatment and progress assessment.  She follows the category two eating plan approximately 45% of the time and occasionally journals her food intake, aiming to maintain a daily caloric intake of around 1200 calories. She engages in physical activity four days a week for about 50 minutes, including using the elliptical, following YouTube exercise videos, and walking 10,000 steps on three out of six days. She has lost one pound in the last month. She feels more comfortable with her eating habits, enjoys healthier options, and manages portion control, even during a recent cruise. She identifies fried chicken as a trigger food and is working on managing it.  She is being treated for vitamin D  deficiency with a prescription of 50,000 international units per week and requests a refill. Previous lab results showed vitamin D  levels of 83 and 89.  During a recent cruise with her husband and son, she managed her eating habits well, enjoying foods without feeling deprived. She is more comfortable with eating less and choosing healthier options, such as salads with balsamic vinegar. Her rings have become looser, indicating weight loss. She achieved 10,000 steps in a controlled  environment, which she found different from walking at home. She acknowledges the challenge of maintaining this level of activity outside of the cruise environment.      PHYSICAL EXAM:  Blood pressure 136/71, pulse 68, temperature 98.6 F (37 C), height 5' 4 (1.626 m), weight 200 lb (90.7 kg), SpO2 95%. Body mass index is 34.33 kg/m.  DIAGNOSTIC DATA REVIEWED:  BMET    Component Value Date/Time   NA 143 07/22/2023 1144   K 4.8 07/22/2023 1144   CL 107 (H) 07/22/2023 1144   CO2 21 07/22/2023 1144   GLUCOSE 71 07/22/2023 1144   GLUCOSE 85 06/12/2023 1250   BUN 14 07/22/2023 1144   CREATININE 0.77 07/22/2023 1144   CREATININE 0.80 12/07/2019 1246   CREATININE 0.81 12/28/2011 0853   CALCIUM  9.1 07/22/2023 1144   GFRNONAA >60 06/12/2023 1250   GFRNONAA >60 12/07/2019 1246   GFRAA 100 10/09/2019 1425   Lab Results  Component Value Date   HGBA1C 5.6 07/22/2023   HGBA1C 6.2 (H) 10/06/2006   Lab Results  Component Value Date   INSULIN  7.3 07/22/2023   INSULIN  20.9 12/23/2016   Lab Results  Component Value Date   TSH 0.62 05/26/2022   CBC    Component Value Date/Time   WBC 3.4 (L) 06/12/2023 1250   RBC 3.98 06/12/2023 1250   HGB 12.6 06/12/2023 1250   HGB 12.0 04/14/2021 1140   HCT 39.5 06/12/2023 1250   HCT 38.3 04/14/2021 1140   PLT 151 06/12/2023 1250   PLT 238 04/14/2021 1140   MCV  99.2 06/12/2023 1250   MCV 94 04/14/2021 1140   MCH 31.7 06/12/2023 1250   MCHC 31.9 06/12/2023 1250   RDW 14.6 06/12/2023 1250   RDW 13.8 04/14/2021 1140   Iron Studies    Component Value Date/Time   IRON 71 05/25/2023 1441   IRON 42 04/14/2021 1140   TIBC 228.2 (L) 05/25/2023 1441   TIBC 231 (L) 04/14/2021 1140   FERRITIN 153.9 05/25/2023 1441   FERRITIN 338 (H) 04/14/2021 1140   IRONPCTSAT 31.1 05/25/2023 1441   IRONPCTSAT 18 04/14/2021 1140   IRONPCTSAT 17 04/26/2018 1602   Lipid Panel     Component Value Date/Time   CHOL 142 06/02/2023 1242   CHOL 149  11/25/2020 1131   TRIG 69 06/02/2023 1242   HDL 69 06/02/2023 1242   HDL 62 11/25/2020 1131   CHOLHDL 2.1 06/02/2023 1242   VLDL 14 06/02/2023 1242   LDLCALC 59 06/02/2023 1242   LDLCALC 73 11/25/2020 1131   Hepatic Function Panel     Component Value Date/Time   PROT 7.1 07/22/2023 1144   ALBUMIN 4.1 07/22/2023 1144   AST 25 07/22/2023 1144   AST 19 12/07/2019 1246   ALT 24 07/22/2023 1144   ALT 20 12/07/2019 1246   ALKPHOS 65 07/22/2023 1144   BILITOT 0.2 07/22/2023 1144   BILITOT 0.3 12/07/2019 1246   BILIDIR 0.1 02/15/2015 1010   IBILI 0.2 12/28/2011 0853      Component Value Date/Time   TSH 0.62 05/26/2022 1552   Nutritional Lab Results  Component Value Date   VD25OH 83.2 07/22/2023   VD25OH 89.5 10/28/2022   VD25OH 48.25 11/21/2021     Assessment and Plan Assessment & Plan Obesity Obesity management is ongoing. She adheres to the category two eating plan approximately 45% of the time, maintaining a caloric intake of 1200 calories. She exercises four days a week for 50 minutes, including elliptical, YouTube videos, and walking 10,000 steps on three out of six days. She has lost one pound in the last month. She reports improved dietary habits, enjoying healthier food choices, and managing portion control, even during a recent cruise. She is aware of her triggers, such as fried chicken, and is working on managing them. - Continue category two eating plan - Continue exercise regimen four days a week - Encourage maintaining 10,000 steps on three out of six days - Reinforce portion control and healthy food choices  Vitamin D  deficiency Vitamin D  deficiency is being treated with prescription vitamin D  50,000 IU per week. Her vitamin D  levels have been previously recorded at 83 and 89. As weight loss progresses, vitamin D  levels may increase. - Hold vitamin D  supplementation - Plan to check vitamin D  levels next month     She was informed of the importance of  frequent follow up visits to maximize her success with intensive lifestyle modifications for her multiple health conditions.    Louann Penton, MD

## 2023-11-03 NOTE — Progress Notes (Unsigned)
 [name] [address] [place] [address] Phone: [phone] Subjective:   [name] am a scribe for Dr. Claudene.  I'm seeing this patient by the request  of:  Geofm Glade PARAS, MD  CC: bilateral knee pain   YEP:Dlagzrupcz  07/20/2023 Discussed sitting position and avoiding compression in the area.   New problem, doxycycline  given worsening pain and further workup necessary but do not think so.     At moment patient is doing very well.  Discussed icing regimen and home exercises, discussed which activities to do and which ones to.  Increase activity slowly over the course of next several weeks.  Discussed icing regimen.  Follow-up again in 3 months     Update 11/04/2023 Heather [name] is a 78 y.o. female coming in with complaint of B knee pain. Did see Dr. Leonce for R thigh pain in July. Patient states that knees are just fine. The tingling in the thigh have quieted down. Patient is having leg cramps when she wakes up in the morning. It varies when it happens and in duration. It seems like it is lessening and backing off. As the fluid in the legs the cramps get less.        Past Medical History:  Diagnosis Date   Abdominal hernia    Abnormal Pap smear    ALLERGIC RHINITIS 10/22/2006   Allergy    ANEMIA 12/18/2008   Arthritis    scoliosis moderate deg changes lumbar Xray 11/04/17    Asthma    ASYMPTOMATIC POSTMENOPAUSAL STATUS 11/22/2007   Back pain    CAD (coronary artery disease)    in LAD   CHF (congestive heart failure) (HCC)    Complication of anesthesia 2010   pt states she woke up during colonoscopy and was aware of losing control of breathing during mammoplasty   Degenerative arthritis    Diverticulosis    Dyslipidemia    Fibroid    Frequent headaches    GERD 07/20/2007   GOITER, MULTINODULAR 07/20/2007   Headache(784.0) 07/20/2007   HEARING LOSS 11/22/2007   Hiatal hernia    Hiatal hernia    large   History  of chicken pox    Hx of colposcopy with cervical biopsy    HYPERCHOLESTEROLEMIA 01/13/2007   Hyperglycemia    HYPERTENSION 10/22/2006   Lung nodule    unchanged since 05/26/13    Migraines    NASH (nonalcoholic steatohepatitis)    Nocturnal hypoxemia 02/17/2013   Obesity    Obstructive sleep apnea    OSA on CPAP    per neurology   OSTEOARTHRITIS 10/22/2006   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   Pre-diabetes    Sedimentation rate elevation    Sleep apnea    on cpap   Urine incontinence    UTI (urinary tract infection)    Past Surgical History:  Procedure Laterality Date   BREAST SURGERY  1984   Breast reduction b/l    CATARACT EXTRACTION, BILATERAL     DEXA  08/2005   DILATION AND CURETTAGE OF UTERUS     ELECTROCARDIOGRAM  10/15/2006   ESOPHAGOGASTRODUODENOSCOPY  12/08/2005   FOOT SURGERY     hammertoe and bunion 05/2017    Stress Cardiolite  10/21/2005   sweat gland removal     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   Procedure: Left TOTAL KNEE ARTHROPLASTY;  Surgeon: Vernetta Lonni GRADE, MD;  Location: [facility] OR;  Service: Orthopedics;  Laterality:  Left;   UMBILICAL HERNIA REPAIR N/A 04/25/2022   Procedure: OPEN INCARCERATED HERNIA REPAIR UMBILICAL WITH APPENDECTOMY;  Surgeon: Lyndel Deward PARAS, MD;  Location: [facility] OR;  Service: General;  Laterality: N/A;   WISDOM TOOTH EXTRACTION     Social History   Socioeconomic History   Marital status: Married    Spouse name: Rodgers   Number of children: 2   Years of education: [employer] education level: Bachelor's degree (e.g., BA, AB, BS)  Occupational History   Occupation: Retired  Tobacco Use   Smoking status: Former    Current packs/day: 0.00    Average packs/day: 2.0 packs/day for 20.0 years (40.0 ttl pk-yrs)    Types: Cigarettes    Start date: 03/03/1959    Quit date: 03/03/1979    Years since quitting: 44.7    Passive exposure: Past   Smokeless tobacco: Never  Vaping Use   Vaping status:  Never Used  Substance and Sexual Activity   Alcohol use: Yes    Comment: rare - TWICE A YR   Drug use: No   Sexual activity: Yes    Birth control/protection: Surgical  Other Topics Concern   Not on file  Social History Narrative   Patient is married Teretha).   Right Handed   Drinks 1 cup caffeine   Social Drivers of Corporate investment banker Strain: Low Risk  (05/27/2023)   Overall Financial Resource Strain (CARDIA)    Difficulty of Paying Living Expenses: Not hard at all  Food Insecurity: No Food Insecurity (05/27/2023)   Hunger Vital Sign    Worried About Running Out of Food in the Last Year: Never true    Ran Out of Food in the Last Year: Never true  Transportation Needs: No Transportation Needs (05/27/2023)   PRAPARE - Administrator, Civil Service (Medical): No    Lack of Transportation (Non-Medical): No  Physical Activity: Sufficiently Active (05/27/2023)   Exercise Vital Sign    Days of Exercise per Week: 7 days    Minutes of Exercise per Session: 30 min  Recent Concern: Physical Activity - Insufficiently Active (05/24/2023)   Exercise Vital Sign    Days of Exercise per Week: 3 days    Minutes of Exercise per Session: 30 min  Stress: No Stress Concern Present (05/27/2023)   [employer] of Occupational Health - Occupational Stress Questionnaire    Feeling of Stress : Not at all  Social Connections: Socially Integrated (05/27/2023)   Social Connection and Isolation Panel    Frequency of Communication with Friends and Family: More than three times a week    Frequency of Social Gatherings with Friends and Family: More than three times a week    Attends Religious Services: More than 4 times per year    Active Member of [employer] or Organizations: Yes    Attends Engineer, structural: More than 4 times per year    Marital Status: Married   Allergies  Allergen Reactions   Aspirin Hives   Coconut (Cocos Nucifera) Hives   Lisinopril Cough    Metoprolol  Itching   Other     Latex paint, the smell causes hives. Not allergic to latex gloves   General anesthesia - has had issues in past where anesthesia was not properly administered and caused side effects   Peanut-Containing Drug Products Hives, Itching and Other (See Comments)   Prednisone     Unknown reaction    Strawberry Extract Hives  Penicillins Rash   Family History  Problem Relation Age of Onset   Asthma Mother    Depression Mother    Bipolar disorder Mother    Dementia Mother    Arthritis Mother    Breast cancer Mother        primary   Colon cancer Mother        mets from breast   Cancer Mother        colon   Hypertension Father    Migraines Father    Cancer Maternal Aunt        ?   Heart disease Maternal Grandmother    Cancer Maternal Grandfather        stomach ?   Sleep apnea Neg Hx    Esophageal cancer Neg Hx    Stomach cancer Neg Hx    Rectal cancer Neg Hx      Current Outpatient Medications (Cardiovascular):    ezetimibe  (ZETIA ) 10 MG tablet, Take 1 tablet (10 mg total) by mouth daily.   furosemide  (LASIX ) 20 MG tablet, TAKE 1 TABLET TWICE A DAY (Patient taking differently: Take 20 mg by mouth daily.)   rosuvastatin  (CRESTOR ) 40 MG tablet, TAKE 1 TABLET DAILY AT NIGHT  Current Outpatient Medications (Respiratory):    cetirizine (ZYRTEC) 10 MG tablet, Take 10 mg by mouth daily.  Current Outpatient Medications (Analgesics):    acetaminophen  (TYLENOL ) 500 MG tablet, Take 1,000 mg by mouth 3 (three) times daily as needed for moderate pain.   meloxicam  (MOBIC ) 15 MG tablet, Take 1 tablet (15 mg total) by mouth daily.   naproxen sodium (ALEVE) 220 MG tablet, Take 220 mg by mouth as needed.   traMADol  (ULTRAM ) 50 MG tablet, Take 1 tablet (50 mg total) by mouth 2 (two) times daily as needed.  Current Outpatient Medications (Hematological):    clopidogrel  (PLAVIX ) 75 MG tablet, TAKE 1 TABLET DAILY   Cyanocobalamin  1000 MCG CAPS, Take 1 capsule by  mouth daily.   Ferrous Sulfate  (IRON) 325 (65 FE) MG TABS, Take 1 tablet by mouth daily.  Current Outpatient Medications (Other):    Boswellia-Glucosamine-Vit D (OSTEO BI-FLEX ONE PER DAY PO), Take 1 tablet by mouth daily.   Calcium  Carb-Cholecalciferol (CHEWABLE CALCIUM /D3 PO), Take 1 tablet by mouth daily.   Coenzyme Q10 (CO Q 10 PO), Take 400 mg by mouth daily.   doxycycline  (VIBRA -TABS) 100 MG tablet, Take 1 tablet (100 mg total) by mouth 2 (two) times daily.   DULoxetine  (CYMBALTA ) 20 MG capsule, TAKE 1 CAPSULE BY MOUTH EVERY DAY   gabapentin  (NEURONTIN ) 100 MG capsule, TAKE 3 CAPSULES AT BEDTIME   KLOR-CON  M20 20 MEQ tablet, TAKE 1 TABLET DAILY   Multiple Vitamin (MULTIVITAMIN) tablet, Take 1 tablet by mouth daily.   Omega-3 Fatty Acids (FISH OIL PO), Take 2 g by mouth daily.   pantoprazole  (PROTONIX ) 20 MG tablet, TAKE 1 TABLET TWICE A DAY, 30 MINUTES BEFORE FOOD   polyethylene glycol (MIRALAX  / GLYCOLAX ) 17 g packet, Take 17 g by mouth every other day.   pyridoxine  (B-6) 200 MG tablet, Take 200 mg by mouth daily.   solifenacin  (VESICARE ) 10 MG tablet, Take 1 tablet (10 mg total) by mouth daily.   topiramate  (TOPAMAX ) 25 MG tablet, Take 3 tablets (75 mg total) by mouth at bedtime.   TURMERIC PO, Take 538 mg by mouth daily.   VEVYE 0.1 % SOLN, Apply 1 drop to eye 2 (two) times daily.   Vitamin D , Ergocalciferol , (DRISDOL ) 1.25 MG (50000  UNIT) CAPS capsule, Take 1 capsule (50,000 Units total) by mouth every 7 (seven) days.   vitamin E  200 UNIT capsule, Take 200 Units by mouth daily.   Reviewed prior external information including notes and imaging from  primary care provider As well as notes that were available from care everywhere and other healthcare systems.  Past medical history, social, surgical and family history all reviewed in electronic medical record.  No pertanent information unless stated regarding to the chief complaint.   Review of Systems:  No headache, visual  changes, nausea, vomiting, diarrhea, constipation, dizziness, abdominal pain, skin rash, fevers, chills, night sweats, weight loss, swollen lymph nodes, body aches, joint swelling, chest pain, shortness of breath, mood changes. POSITIVE muscle aches  Objective  Blood pressure 132/70, pulse 65, height 5' 4 (1.626 m), weight 203 lb 12.8 oz (92.4 kg), SpO2 95%.   General: No apparent distress alert and oriented x3 mood and affect normal, dressed appropriately.  HEENT: Pupils equal, extraocular movements intact  Respiratory: Patient's speak in full sentences and does not appear short of breath  Mild antalgic gait noted secondary to some of the arthritic changes.  Patient is doing remarkably well with her weight loss.  Nontender in the legs today.  Neurovascularly intact.  Deep tendon reflexes are intact    Impression and Recommendations:     The above documentation has been reviewed and is accurate and complete Coila Wardell M Lyndee Herbst, DO

## 2023-11-04 ENCOUNTER — Encounter: Payer: Self-pay | Admitting: Family Medicine

## 2023-11-04 ENCOUNTER — Ambulatory Visit (INDEPENDENT_AMBULATORY_CARE_PROVIDER_SITE_OTHER): Admitting: Family Medicine

## 2023-11-04 VITALS — BP 132/70 | HR 65 | Ht 64.0 in | Wt 203.8 lb

## 2023-11-04 DIAGNOSIS — M62838 Other muscle spasm: Secondary | ICD-10-CM

## 2023-11-04 NOTE — Assessment & Plan Note (Signed)
 Continues to intermittently have this.  Has been suffering with it off and on for 7 to 8 years.  We discussed though that it did seem to be this sleep apnea and some iron deficiency initially.  Patient is supplementing appropriately when necessary, patient is wearing her CPAP regularly but it is time to have it further evaluated again.  Discussed which activities to do and which ones to avoid otherwise.  Increase activity slowly.  Follow-up again in 6 to 12 weeks

## 2023-11-04 NOTE — Patient Instructions (Signed)
 Good to see you. Watch the hydration and exercises. Take it easy on the cruises. See me again in 3 months.

## 2023-11-08 ENCOUNTER — Ambulatory Visit (INDEPENDENT_AMBULATORY_CARE_PROVIDER_SITE_OTHER): Admitting: Family Medicine

## 2023-11-11 DIAGNOSIS — K432 Incisional hernia without obstruction or gangrene: Secondary | ICD-10-CM | POA: Diagnosis not present

## 2023-11-16 ENCOUNTER — Encounter: Payer: Self-pay | Admitting: Internal Medicine

## 2023-11-16 NOTE — Progress Notes (Unsigned)
 Subjective:    Patient ID: Heather [name], female    DOB: 07-20-1945, 78 y.o.   MRN: [id number]     HPI Heather [name] is here for follow up of her chronic medical problems.  Fell 4 days ago.  She tripped on something on the floor - a step that was part of a platform.  Did not hit her head or right knee.  She fell on her back.  No lacerations.  She has a bruise on her left elbow.  She had a stiff right leg.  She has rested a lot and used icy hot.  The right leg has improved.  She has some pain in her left lateral ribs.   Elliptical 30-45 min at least 3 times a week.  Doing exercises - video at home  Medications and allergies reviewed with patient and updated if appropriate.  Current Outpatient Medications on File Prior to Visit  Medication Sig Dispense Refill   acetaminophen  (TYLENOL ) 500 MG tablet Take 1,000 mg by mouth 3 (three) times daily as needed for moderate pain.     Boswellia-Glucosamine-Vit D (OSTEO BI-FLEX ONE PER DAY PO) Take 1 tablet by mouth daily.     Calcium  Carb-Cholecalciferol (CHEWABLE CALCIUM /D3 PO) Take 1 tablet by mouth daily.     cetirizine (ZYRTEC) 10 MG tablet Take 10 mg by mouth daily.     clopidogrel  (PLAVIX ) 75 MG tablet TAKE 1 TABLET DAILY 90 tablet 3   Coenzyme Q10 (CO Q 10 PO) Take 400 mg by mouth daily.     Cyanocobalamin  1000 MCG CAPS Take 1 capsule by mouth daily.     DULoxetine  (CYMBALTA ) 20 MG capsule TAKE 1 CAPSULE BY MOUTH EVERY DAY 90 capsule 1   ezetimibe  (ZETIA ) 10 MG tablet Take 1 tablet (10 mg total) by mouth daily. 90 tablet 3   Ferrous Sulfate  (IRON) 325 (65 FE) MG TABS Take 1 tablet by mouth daily.     furosemide  (LASIX ) 20 MG tablet TAKE 1 TABLET TWICE A DAY (Patient taking differently: Take 20 mg by mouth daily.) 180 tablet 3   gabapentin  (NEURONTIN ) 100 MG capsule TAKE 3 CAPSULES AT BEDTIME 270 capsule 3   KLOR-CON  M20 20 MEQ tablet TAKE 1 TABLET DAILY 90 tablet 3   meloxicam  (MOBIC ) 15 MG tablet Take 1 tablet (15 mg total) by mouth  daily. 14 tablet 0   Multiple Vitamin (MULTIVITAMIN) tablet Take 1 tablet by mouth daily.     naproxen sodium (ALEVE) 220 MG tablet Take 220 mg by mouth as needed.     Omega-3 Fatty Acids (FISH OIL PO) Take 2 g by mouth daily.     pantoprazole  (PROTONIX ) 20 MG tablet TAKE 1 TABLET TWICE A DAY, 30 MINUTES BEFORE FOOD 180 tablet 3   polyethylene glycol (MIRALAX  / GLYCOLAX ) 17 g packet Take 17 g by mouth every other day.     pyridoxine  (B-6) 200 MG tablet Take 200 mg by mouth daily.     rosuvastatin  (CRESTOR ) 40 MG tablet TAKE 1 TABLET DAILY AT NIGHT 90 tablet 3   solifenacin  (VESICARE ) 10 MG tablet Take 1 tablet (10 mg total) by mouth daily. 90 tablet 3   topiramate  (TOPAMAX ) 25 MG tablet Take 3 tablets (75 mg total) by mouth at bedtime. 270 tablet 3   TURMERIC PO Take 538 mg by mouth daily.     VEVYE 0.1 % SOLN Apply 1 drop to eye 2 (two) times daily.     Vitamin D , Ergocalciferol , (  DRISDOL ) 1.25 MG (50000 UNIT) CAPS capsule Take 1 capsule (50,000 Units total) by mouth every 7 (seven) days. 13 capsule 0   vitamin E  200 UNIT capsule Take 200 Units by mouth daily.     No current facility-administered medications on file prior to visit.     Review of Systems  Constitutional:  Negative for fever.  Respiratory:  Negative for cough, shortness of breath (with moderate exertion) and wheezing.   Cardiovascular:  Positive for leg swelling (controlled). Negative for chest pain and palpitations.  Neurological:  Negative for light-headedness and headaches.       Objective:   Vitals:   11/17/23 1310  BP: 120/64  Pulse: 66  Temp: 98.4 F (36.9 C)  SpO2: 94%   BP Readings from Last 3 Encounters:  11/17/23 120/64  11/04/23 132/70  10/26/23 136/71   Wt Readings from Last 3 Encounters:  11/17/23 206 lb 12.8 oz (93.8 kg)  11/04/23 203 lb 12.8 oz (92.4 kg)  10/26/23 200 lb (90.7 kg)   Body mass index is 35.5 kg/m.    Physical Exam Constitutional:      General: She is not in acute  distress.    Appearance: Normal appearance.  HENT:     Head: Normocephalic and atraumatic.  Eyes:     Conjunctiva/sclera: Conjunctivae normal.  Cardiovascular:     Rate and Rhythm: Normal rate and regular rhythm.     Heart sounds: Normal heart sounds.  Pulmonary:     Effort: Pulmonary effort is normal. No respiratory distress.     Breath sounds: Normal breath sounds. No wheezing.  Musculoskeletal:     Cervical back: Neck supple.     Right lower leg: No edema.     Left lower leg: Edema (trace) present.  Lymphadenopathy:     Cervical: No cervical adenopathy.  Skin:    General: Skin is warm and dry.     Findings: No rash.  Neurological:     Mental Status: She is alert. Mental status is at baseline.  Psychiatric:        Mood and Affect: Mood normal.        Behavior: Behavior normal.        Lab Results  Component Value Date   WBC 3.4 (L) 06/12/2023   HGB 12.6 06/12/2023   HCT 39.5 06/12/2023   PLT 151 06/12/2023   GLUCOSE 71 07/22/2023   CHOL 142 06/02/2023   TRIG 69 06/02/2023   HDL 69 06/02/2023   LDLCALC 59 06/02/2023   ALT 24 07/22/2023   AST 25 07/22/2023   NA 143 07/22/2023   K 4.8 07/22/2023   CL 107 (H) 07/22/2023   CREATININE 0.77 07/22/2023   BUN 14 07/22/2023   CO2 21 07/22/2023   TSH 0.62 05/26/2022   INR 1.1 04/26/2018   HGBA1C 5.6 07/22/2023   MICROALBUR <0.7 05/25/2023     Assessment & Plan:    See Problem List for Assessment and Plan of chronic medical problems.

## 2023-11-16 NOTE — Patient Instructions (Addendum)
    Flu immunization administered today.      Blood work was ordered.       Medications changes include :   try decreasing the gabapentin  to 200 mg nightly -- if you do ago with that try 100 mg      Return in about 6 months (around 05/16/2024) for follow up.

## 2023-11-17 ENCOUNTER — Ambulatory Visit (INDEPENDENT_AMBULATORY_CARE_PROVIDER_SITE_OTHER): Admitting: Internal Medicine

## 2023-11-17 VITALS — BP 120/64 | HR 66 | Temp 98.4°F | Ht 64.0 in | Wt 206.8 lb

## 2023-11-17 DIAGNOSIS — E049 Nontoxic goiter, unspecified: Secondary | ICD-10-CM

## 2023-11-17 DIAGNOSIS — I152 Hypertension secondary to endocrine disorders: Secondary | ICD-10-CM | POA: Diagnosis not present

## 2023-11-17 DIAGNOSIS — Z23 Encounter for immunization: Secondary | ICD-10-CM

## 2023-11-17 DIAGNOSIS — G6281 Critical illness polyneuropathy: Secondary | ICD-10-CM | POA: Diagnosis not present

## 2023-11-17 DIAGNOSIS — I2510 Atherosclerotic heart disease of native coronary artery without angina pectoris: Secondary | ICD-10-CM | POA: Diagnosis not present

## 2023-11-17 DIAGNOSIS — E785 Hyperlipidemia, unspecified: Secondary | ICD-10-CM

## 2023-11-17 DIAGNOSIS — G4733 Obstructive sleep apnea (adult) (pediatric): Secondary | ICD-10-CM | POA: Diagnosis not present

## 2023-11-17 DIAGNOSIS — R600 Localized edema: Secondary | ICD-10-CM

## 2023-11-17 DIAGNOSIS — K219 Gastro-esophageal reflux disease without esophagitis: Secondary | ICD-10-CM

## 2023-11-17 DIAGNOSIS — E1169 Type 2 diabetes mellitus with other specified complication: Secondary | ICD-10-CM

## 2023-11-17 DIAGNOSIS — D509 Iron deficiency anemia, unspecified: Secondary | ICD-10-CM | POA: Diagnosis not present

## 2023-11-17 DIAGNOSIS — E1159 Type 2 diabetes mellitus with other circulatory complications: Secondary | ICD-10-CM

## 2023-11-17 DIAGNOSIS — E559 Vitamin D deficiency, unspecified: Secondary | ICD-10-CM | POA: Diagnosis not present

## 2023-11-17 LAB — COMPREHENSIVE METABOLIC PANEL WITH GFR
ALT: 19 U/L (ref 0–35)
AST: 23 U/L (ref 0–37)
Albumin: 3.7 g/dL (ref 3.5–5.2)
Alkaline Phosphatase: 45 U/L (ref 39–117)
BUN: 13 mg/dL (ref 6–23)
CO2: 26 meq/L (ref 19–32)
Calcium: 8.8 mg/dL (ref 8.4–10.5)
Chloride: 108 meq/L (ref 96–112)
Creatinine, Ser: 0.74 mg/dL (ref 0.40–1.20)
GFR: 78.23 mL/min (ref >60.00)
Glucose, Bld: 84 mg/dL (ref 70–99)
Potassium: 4.1 meq/L (ref 3.5–5.1)
Sodium: 142 meq/L (ref 135–145)
Total Bilirubin: 0.5 mg/dL (ref 0.2–1.2)
Total Protein: 7.1 g/dL (ref 6.0–8.3)

## 2023-11-17 LAB — LIPID PANEL
Cholesterol: 128 mg/dL (ref 0–200)
HDL: 56.8 mg/dL (ref >39.00)
LDL Cholesterol: 61 mg/dL (ref 0–99)
NonHDL: 71.04
Total CHOL/HDL Ratio: 2
Triglycerides: 48 mg/dL (ref 0.0–149.0)
VLDL: 9.6 mg/dL (ref 0.0–40.0)

## 2023-11-17 LAB — CBC WITH DIFFERENTIAL/PLATELET
Basophils Absolute: 0 K/uL (ref 0.0–0.1)
Basophils Relative: 0.9 % (ref 0.0–3.0)
Eosinophils Absolute: 0.2 K/uL (ref 0.0–0.7)
Eosinophils Relative: 6.2 % — ABNORMAL HIGH (ref 0.0–5.0)
HCT: 36.9 % (ref 36.0–46.0)
Hemoglobin: 12.1 g/dL (ref 12.0–15.0)
Lymphocytes Relative: 25.9 % (ref 12.0–46.0)
Lymphs Abs: 0.9 K/uL (ref 0.7–4.0)
MCHC: 32.9 g/dL (ref 30.0–36.0)
MCV: 94.2 fl (ref 78.0–100.0)
Monocytes Absolute: 0.4 K/uL (ref 0.1–1.0)
Monocytes Relative: 12.6 % — ABNORMAL HIGH (ref 3.0–12.0)
Neutro Abs: 1.8 K/uL (ref 1.4–7.7)
Neutrophils Relative %: 54.4 % (ref 43.0–77.0)
Platelets: 145 K/uL — ABNORMAL LOW (ref 150.0–400.0)
RBC: 3.91 Mil/uL (ref 3.87–5.11)
RDW: 15.5 % (ref 11.5–15.5)
WBC: 3.3 K/uL — ABNORMAL LOW (ref 4.0–10.5)

## 2023-11-17 LAB — TSH: TSH: 0.76 u[IU]/mL (ref 0.35–5.50)

## 2023-11-17 LAB — FERRITIN: Ferritin: 220.2 ng/mL (ref 10.0–291.0)

## 2023-11-17 LAB — HEMOGLOBIN A1C: Hgb A1c MFr Bld: 6.2 % (ref 4.6–6.5)

## 2023-11-17 LAB — IBC PANEL
Iron: 32 ug/dL — ABNORMAL LOW (ref 42–145)
Saturation Ratios: 15.8 % — ABNORMAL LOW (ref 20.0–50.0)
TIBC: 203 ug/dL — ABNORMAL LOW (ref 250.0–450.0)
Transferrin: 145 mg/dL — ABNORMAL LOW (ref 212.0–360.0)

## 2023-11-17 LAB — VITAMIN D 25 HYDROXY (VIT D DEFICIENCY, FRACTURES): VITD: 39.4 ng/mL (ref 30.00–100.00)

## 2023-11-17 MED ORDER — FUROSEMIDE 20 MG PO TABS: 20.0000 mg | ORAL_TABLET | Freq: Every day | ORAL | 3 refills | Status: DC

## 2023-11-17 NOTE — Assessment & Plan Note (Addendum)
 Chronic Neuropathy controlled - she is not sure why she is taking this Currently taking gabapentin  300 mg nightly Advised by neurology 100 mg nightly for a while and there is no increase in pain or any problems try 100 mg nightly-Will see if she truly needs this medication at this point or not

## 2023-11-17 NOTE — Assessment & Plan Note (Addendum)
 Chronic Associated with CAD, hyperlipidemia, peripheral neuropathy  Lab Results  Component Value Date   HGBA1C 5.6 07/22/2023   sugars well controlled check a1c continue diet control stressed importance of regular exercise and diabetic diet

## 2023-11-17 NOTE — Assessment & Plan Note (Signed)
Chronic No current symptoms suggestive of angina Following with Dr. Ronelle Nigh On Plavix 75 mg daily, Zetia 10 mg daily and Crestor 40 mg daily Continue above medications

## 2023-11-17 NOTE — Assessment & Plan Note (Signed)
 Chronic Taking vitamin D daily Check vitamin D level

## 2023-11-17 NOTE — Assessment & Plan Note (Signed)
Chronic Regular exercise and healthy diet encouraged Check lipid panel, CMP Continue Zetia 10 mg daily, Crestor 40 mg daily

## 2023-11-17 NOTE — Assessment & Plan Note (Signed)
 Chronic stable Clinically euthyroid Check TSH

## 2023-11-17 NOTE — Assessment & Plan Note (Addendum)
 Chronic Uses her cpap machine nightly Was on oxygen  at 1 point at night but no longer needs it

## 2023-11-17 NOTE — Assessment & Plan Note (Signed)
 Chronic BP controlled Furosemide  20 mg daily

## 2023-11-17 NOTE — Assessment & Plan Note (Addendum)
 Chronic LLE > RLE Controlled with lasix  20 mg daily - occasionally takes it bid cmp

## 2023-11-17 NOTE — Assessment & Plan Note (Signed)
Chronic Taking iron daily Iron panel, cbc

## 2023-11-17 NOTE — Addendum Note (Signed)
 Addended by: CLAUDENE TOBIAS PARAS on: 11/17/2023 02:52 PM   Modules accepted: Orders

## 2023-11-17 NOTE — Assessment & Plan Note (Addendum)
 Chronic Large hiatal hernia GERD controlled Continue pantoprazole  20 mg bid

## 2023-11-18 ENCOUNTER — Ambulatory Visit: Payer: Self-pay | Admitting: Internal Medicine

## 2023-11-18 ENCOUNTER — Encounter: Payer: Self-pay | Admitting: Internal Medicine

## 2023-11-19 ENCOUNTER — Encounter (INDEPENDENT_AMBULATORY_CARE_PROVIDER_SITE_OTHER): Payer: Self-pay | Admitting: Family Medicine

## 2023-11-19 ENCOUNTER — Other Ambulatory Visit: Payer: Self-pay

## 2023-11-19 MED ORDER — FUROSEMIDE 20 MG PO TABS: 20.0000 mg | ORAL_TABLET | Freq: Every day | ORAL | 3 refills | Status: AC

## 2023-11-20 ENCOUNTER — Encounter: Payer: Self-pay | Admitting: Internal Medicine

## 2023-11-22 NOTE — Telephone Encounter (Signed)
 Correct, no fasting needed

## 2023-11-23 ENCOUNTER — Ambulatory Visit (INDEPENDENT_AMBULATORY_CARE_PROVIDER_SITE_OTHER): Admitting: Family Medicine

## 2023-11-26 ENCOUNTER — Telehealth: Payer: Self-pay | Admitting: Radiology

## 2023-11-26 NOTE — Telephone Encounter (Signed)
 Spoke with [facility] today and instructions given.

## 2023-11-26 NOTE — Telephone Encounter (Signed)
 Copied from CRM #[id number]. Topic: Clinical - Medication Question >> Nov 26, 2023  1:09 PM Anairis L wrote: Reason for CRM: Verneita with [facility]  [phone]  Is calling in requesting clarification on  furosemide  (LASIX ) 20 MG tablet.  Please call back.

## 2023-12-08 ENCOUNTER — Ambulatory Visit (INDEPENDENT_AMBULATORY_CARE_PROVIDER_SITE_OTHER): Admitting: Family Medicine

## 2023-12-08 ENCOUNTER — Encounter (INDEPENDENT_AMBULATORY_CARE_PROVIDER_SITE_OTHER): Payer: Self-pay | Admitting: Family Medicine

## 2023-12-08 VITALS — BP 131/73 | HR 59 | Temp 98.2°F | Ht 64.0 in | Wt 201.0 lb

## 2023-12-08 DIAGNOSIS — E66813 Obesity, class 3: Secondary | ICD-10-CM

## 2023-12-08 DIAGNOSIS — E559 Vitamin D deficiency, unspecified: Secondary | ICD-10-CM | POA: Diagnosis not present

## 2023-12-08 DIAGNOSIS — Z6834 Body mass index (BMI) 34.0-34.9, adult: Secondary | ICD-10-CM

## 2023-12-08 DIAGNOSIS — G8929 Other chronic pain: Secondary | ICD-10-CM | POA: Diagnosis not present

## 2023-12-08 DIAGNOSIS — E669 Obesity, unspecified: Secondary | ICD-10-CM

## 2023-12-08 MED ORDER — VITAMIN D (ERGOCALCIFEROL) 1.25 MG (50000 UNIT) PO CAPS: ORAL_CAPSULE | ORAL | 0 refills | Status: DC

## 2023-12-08 NOTE — Progress Notes (Signed)
 Office: [phone]  /  Fax: [phone]  WEIGHT SUMMARY AND BIOMETRICS  Anthropometric Measurements Height: 5' 4 (1.626 m) Weight: 201 lb (91.2 kg) BMI (Calculated): 34.48 Weight at Last Visit: 200 lb Weight Lost Since Last Visit: 0 Weight Gained Since Last Visit: 1 lb Starting Weight: 268 lb Total Weight Loss (lbs): 67 lb (30.4 kg) Peak Weight: 268 lb   Body Composition  Body Fat %: 47.3 % Fat Mass (lbs): 95.2 lbs Muscle Mass (lbs): 100.8 lbs Total Body Water (lbs): 82.6 lbs Visceral Fat Rating : 15   Other Clinical Data Fasting: no Labs: no Today's Visit #: 98 Starting Date: 12/23/16    Chief Complaint: OBESITY   History of Present Illness Heather [name] is a 78 year old female who presents for obesity treatment and progress assessment.  She has been following the prescribed category two eating plan approximately seventy-five percent of the time, focusing on increasing protein intake, consuming more fruits and vegetables, and maintaining adequate hydration. However, she occasionally skips meals.  Her physical activity includes exercising two to three days a week for thirty minutes, using YouTube videos or her elliptical machine. Despite these efforts, she has experienced a weight gain of one pound over the last six weeks since her previous visit.  She has questions about her medication list. She is no longer on Melaxicam and would like this removed. She doesn't think she is on Cymbalta  currently.  She is off vit D Rx due to her levels getting close to over replaced. Her Vit D was rechecked and had dropped below goal.      PHYSICAL EXAM:  Blood pressure 131/73, pulse (!) 59, temperature 98.2 F (36.8 C), height 5' 4 (1.626 m), weight 201 lb (91.2 kg), SpO2 99%. Body mass index is 34.5 kg/m.  DIAGNOSTIC DATA REVIEWED:  BMET    Component Value Date/Time   NA 142 11/17/2023 1420   NA 143 07/22/2023 1144   K 4.1 11/17/2023 1420   CL 108  11/17/2023 1420   CO2 26 11/17/2023 1420   GLUCOSE 84 11/17/2023 1420   BUN 13 11/17/2023 1420   BUN 14 07/22/2023 1144   CREATININE 0.74 11/17/2023 1420   CREATININE 0.80 12/07/2019 1246   CREATININE 0.81 12/28/2011 0853   CALCIUM  8.8 11/17/2023 1420   GFRNONAA >60 06/12/2023 1250   GFRNONAA >60 12/07/2019 1246   GFRAA 100 10/09/2019 1425   Lab Results  Component Value Date   HGBA1C 6.2 11/17/2023   HGBA1C 6.2 (H) 10/06/2006   Lab Results  Component Value Date   INSULIN  7.3 07/22/2023   INSULIN  20.9 12/23/2016   Lab Results  Component Value Date   TSH 0.76 11/17/2023   CBC    Component Value Date/Time   WBC 3.3 (L) 11/17/2023 1420   RBC 3.91 11/17/2023 1420   HGB 12.1 11/17/2023 1420   HGB 12.0 04/14/2021 1140   HCT 36.9 11/17/2023 1420   HCT 38.3 04/14/2021 1140   PLT 145.0 (L) 11/17/2023 1420   PLT 238 04/14/2021 1140   MCV 94.2 11/17/2023 1420   MCV 94 04/14/2021 1140   MCH 31.7 06/12/2023 1250   MCHC 32.9 11/17/2023 1420   RDW 15.5 11/17/2023 1420   RDW 13.8 04/14/2021 1140   Iron Studies    Component Value Date/Time   IRON 32 (L) 11/17/2023 1420   IRON 42 04/14/2021 1140   TIBC 203.0 (L) 11/17/2023 1420   TIBC 231 (L) 04/14/2021 1140   FERRITIN 220.2 11/17/2023 1420  FERRITIN 338 (H) 04/14/2021 1140   IRONPCTSAT 15.8 (L) 11/17/2023 1420   IRONPCTSAT 18 04/14/2021 1140   IRONPCTSAT 17 04/26/2018 1602   Lipid Panel     Component Value Date/Time   CHOL 128 11/17/2023 1420   CHOL 149 11/25/2020 1131   TRIG 48.0 11/17/2023 1420   HDL 56.80 11/17/2023 1420   HDL 62 11/25/2020 1131   CHOLHDL 2 11/17/2023 1420   VLDL 9.6 11/17/2023 1420   LDLCALC 61 11/17/2023 1420   LDLCALC 73 11/25/2020 1131   Hepatic Function Panel     Component Value Date/Time   PROT 7.1 11/17/2023 1420   PROT 7.1 07/22/2023 1144   ALBUMIN 3.7 11/17/2023 1420   ALBUMIN 4.1 07/22/2023 1144   AST 23 11/17/2023 1420   AST 19 12/07/2019 1246   ALT 19 11/17/2023 1420    ALT 20 12/07/2019 1246   ALKPHOS 45 11/17/2023 1420   BILITOT 0.5 11/17/2023 1420   BILITOT 0.2 07/22/2023 1144   BILITOT 0.3 12/07/2019 1246   BILIDIR 0.1 02/15/2015 1010   IBILI 0.2 12/28/2011 0853      Component Value Date/Time   TSH 0.76 11/17/2023 1420   Nutritional Lab Results  Component Value Date   VD25OH 39.40 11/17/2023   VD25OH 83.2 07/22/2023   VD25OH 89.5 10/28/2022     Assessment and Plan Assessment & Plan Obesity Obesity management is ongoing with partial adherence to the category two eating plan and physical activity regimen. Despite efforts, there is a weight gain of one pound over the last six weeks. - Encourage adherence to the category two eating plan with consistent meal consumption. - Promote regular physical activity, aiming for increased frequency and duration. - Reassess weight management strategies in future visits.  Vit D deficiency Not at goal -restart vit D 50k international units  q2 weeks and follow  Chronic pain She was prescribed Cymbalta  by Dr Claudene in August. She will look through her medications again to see if she is taking it or if she needs a refill from Dr Claudene.      Heather [name] was informed of the importance of frequent follow up visits to maximize her success with intensive lifestyle modifications for her obesity and obesity related health conditions as recommended by USPSTF and CMS guidelines   Louann Penton, MD

## 2023-12-15 ENCOUNTER — Encounter: Payer: Self-pay | Admitting: Neurology

## 2023-12-15 ENCOUNTER — Ambulatory Visit: Payer: Medicare Other | Admitting: Neurology

## 2023-12-15 ENCOUNTER — Other Ambulatory Visit (HOSPITAL_COMMUNITY): Payer: Self-pay

## 2023-12-15 VITALS — BP 142/70 | HR 61 | Ht 64.0 in | Wt 202.4 lb

## 2023-12-15 DIAGNOSIS — G43009 Migraine without aura, not intractable, without status migrainosus: Secondary | ICD-10-CM

## 2023-12-15 DIAGNOSIS — G4733 Obstructive sleep apnea (adult) (pediatric): Secondary | ICD-10-CM

## 2023-12-15 DIAGNOSIS — Z23 Encounter for immunization: Secondary | ICD-10-CM | POA: Diagnosis not present

## 2023-12-15 MED ORDER — TOPIRAMATE 25 MG PO TABS: 75.0000 mg | ORAL_TABLET | Freq: Every day | ORAL | 3 refills | Status: DC

## 2023-12-15 MED ORDER — COVID-19 MRNA VAC-TRIS(PFIZER) 30 MCG/0.3ML IM SUSY
0.3000 mL | PREFILLED_SYRINGE | Freq: Once | INTRAMUSCULAR | 0 refills | Status: AC
  Filled 2023-12-15: qty 0.3, 1d supply, fill #0

## 2023-12-15 NOTE — Patient Instructions (Signed)
 Great to see you today! Order home sleep study to qualify for new BiPAP machine. When you do the sleep study, do not wear your BIPAP Continue Topamax  Follow-up in 1 year.  Thanks

## 2023-12-15 NOTE — Progress Notes (Signed)
 PATIENT: Heather [name] DOB: 01-19-1946  REASON FOR VISIT: follow up HISTORY FROM: patient, husband Primary Neurologist: Willis/Dohmeier   HISTORY OF PRESENT ILLNESS: Today 12/15/23 12/15/23 SS: BIPAP shows 100% compliance, 12/8 cm water, leak 43, AHI 0.3. Memory is all there, just can't access it quickly, knows the answer, may take her extra time. Is independent, drives, does what she wants. No headaches, remains on Topamax  75 mg daily. The head gear elastic on her BIPAP stretches overtime, causes leaking of nasal mask. BIPAP setup Aug 2020.  12/14/22 SS: Had SBO in Feb, has recovered well. MMSE 30/30 today. Thinks short term memory is age related, takes her longer to recall but she can. Remains on Topamax  75 mg at bedtime, no headaches. Wishes to remain on.  BiPAP usage is superb at 100%, AHI 0.3, leak 46.4, IPAP 12 EPAP 8 cm water.  Remains very active, lives with her husband, continues to be involved with [employer].  Update 12/16/21 SS: Heather [name] is here today for follow-up.  MMSE 30/30. She focused for the test. Short term memory is not extraordinary anymore. Some trouble remembering names, but remembers other complicated details about them. Lives with husband. Does her own daily activities. Is very active, independent. She doesn't think memory issue, more of overload problem.   Headaches are essentially resolved. 2 headaches in 1 year. Still on Topamax  75 mg, tried to reduce to 25 mg had more headaches. No change in memory with lower dose.   Review of CPAP data indicates overall excellent compliance at 97% 29/30 days.  Average usage days used 6 hours 41 minutes. IPAP 12, EPAP 8.  Leak in the 95th percentile was 44.5, AHI 0.4. sleeps like a rock. Sometimes mask leaks with whistle, will tighten. 1 night she was out of town. Uses full face mask. ESS 16, but claims score may be skewed because she relaxes very well.   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 Dealing with right rotator cuff issues.   Update 08/12/20 SS: Heather [name] is a 78 year old female with history of memory issues.  Is on CPAP.  Has been on Topamax  for migraines, was able to reduce the Topamax  from 75 mg to 25 mg daily.  MRI of the brain is unremarkable. MMSE 30/30 today. No change in memory since Topamax  dose reduction. No longer reports severe migraines, only mild headaches, doing well with this. Sometimes will have episodes of wooziness, but if she takes an sip of something to drink resolves. These are not intense. Most issue is word finding, is picky with finding the right word. Hard to identify actors on TV. She drives a car, is still highly functional. Bothers her can't do what she once did, remembering miscellaneous useless facts. In PT, walked in without cane today. Having cataract surgery soon. Has had recent Lumbar ESI.  HISTORY  02/08/2020 Dr. Jenel: Heather [name] is a 78 year old right-handed black female with a history of sleep apnea and diabetes.  The patient is on CPAP.  She reports a 5 to 6-year history of some memory issues that have gradually changed over time.  She mainly reports difficulty with word finding, and difficulty remembering names for people.  She has a history of migraine headaches, she has been on Topamax  for a number of years but seems to tolerate this fairly well.  She currently is on 100 mg daily of Topamax .  She reports no problems with driving.  She is  able to keep up with her medications and appointments fairly well, but occasionally she will get mixed up on dosing of medications.  The patient reports that she sleeps well at night and has a good energy level during the day.  She is doing quite well with her migraines, she only has an occasional headache.  She reports that her mother also had dementia but she had bipolar disorder as well.  The patient is sent to this office for further evaluation.  REVIEW OF SYSTEMS: Out of a complete 14 system  review of symptoms, the patient complains only of the following symptoms, and all other reviewed systems are negative.  See HPI  ALLERGIES: Allergies  Allergen Reactions   Aspirin Hives   Coconut (Cocos Nucifera) Hives   Lisinopril Cough   Metoprolol  Itching   Other     Latex paint, the smell causes hives. Not allergic to latex gloves   General anesthesia - has had issues in past where anesthesia was not properly administered and caused side effects   Peanut-Containing Drug Products Hives, Itching and Other (See Comments)   Prednisone     Unknown reaction    Strawberry Extract Hives   Penicillins Rash    HOME MEDICATIONS: Outpatient Medications Prior to Visit  Medication Sig Dispense Refill   acetaminophen  (TYLENOL ) 500 MG tablet Take 1,000 mg by mouth 3 (three) times daily as needed for moderate pain.     Boswellia-Glucosamine-Vit D (OSTEO BI-FLEX ONE PER DAY PO) Take 1 tablet by mouth daily.     Calcium  Carb-Cholecalciferol (CHEWABLE CALCIUM /D3 PO) Take 1 tablet by mouth daily.     cetirizine (ZYRTEC) 10 MG tablet Take 10 mg by mouth daily.     clopidogrel  (PLAVIX ) 75 MG tablet TAKE 1 TABLET DAILY 90 tablet 3   Coenzyme Q10 (CO Q 10 PO) Take 400 mg by mouth daily.     Cyanocobalamin  1000 MCG CAPS Take 1 capsule by mouth daily.     DULoxetine  (CYMBALTA ) 20 MG capsule TAKE 1 CAPSULE BY MOUTH EVERY DAY 90 capsule 1   ezetimibe  (ZETIA ) 10 MG tablet Take 1 tablet (10 mg total) by mouth daily. 90 tablet 3   Ferrous Sulfate  (IRON) 325 (65 FE) MG TABS Take 1 tablet by mouth daily.     furosemide  (LASIX ) 20 MG tablet Take 1 tablet (20 mg total) by mouth daily. Take twice daily as needed for increased swelling 180 tablet 3   gabapentin  (NEURONTIN ) 100 MG capsule TAKE 3 CAPSULES AT BEDTIME 270 capsule 3   KLOR-CON  M20 20 MEQ tablet TAKE 1 TABLET DAILY 90 tablet 3   Multiple Vitamin (MULTIVITAMIN) tablet Take 1 tablet by mouth daily.     naproxen sodium (ALEVE) 220 MG tablet Take 220 mg  by mouth as needed.     Omega-3 Fatty Acids (FISH OIL PO) Take 2 g by mouth daily.     pantoprazole  (PROTONIX ) 20 MG tablet TAKE 1 TABLET TWICE A DAY, 30 MINUTES BEFORE FOOD 180 tablet 3   polyethylene glycol (MIRALAX  / GLYCOLAX ) 17 g packet Take 17 g by mouth every other day.     pyridoxine  (B-6) 200 MG tablet Take 200 mg by mouth daily.     rosuvastatin  (CRESTOR ) 40 MG tablet TAKE 1 TABLET DAILY AT NIGHT 90 tablet 3   solifenacin  (VESICARE ) 10 MG tablet Take 1 tablet (10 mg total) by mouth daily. 90 tablet 3   topiramate  (TOPAMAX ) 25 MG tablet Take 3 tablets (75 mg total) by mouth  at bedtime. 270 tablet 3   TURMERIC PO Take 538 mg by mouth daily.     VEVYE 0.1 % SOLN Apply 1 drop to eye 2 (two) times daily.     Vitamin D , Ergocalciferol , (DRISDOL ) 1.25 MG (50000 UNIT) CAPS capsule TAKE EVERY 2 WEEKS 13 capsule 0   vitamin E  200 UNIT capsule Take 200 Units by mouth daily.     No facility-administered medications prior to visit.    PAST MEDICAL HISTORY: Past Medical History:  Diagnosis Date   Abdominal hernia    Abnormal Pap smear    ALLERGIC RHINITIS 10/22/2006   Allergy    ANEMIA 12/18/2008   Arthritis    scoliosis moderate deg changes lumbar Xray 11/04/17    Asthma    ASYMPTOMATIC POSTMENOPAUSAL STATUS 11/22/2007   Back pain    CAD (coronary artery disease)    in LAD   CHF (congestive heart failure) (HCC)    Complication of anesthesia 2010   pt states she woke up during colonoscopy and was aware of losing control of breathing during mammoplasty   Degenerative arthritis    Diverticulosis    Dyslipidemia    Fibroid    Frequent headaches    GERD 07/20/2007   GOITER, MULTINODULAR 07/20/2007   Headache(784.0) 07/20/2007   HEARING LOSS 11/22/2007   Hiatal hernia    Hiatal hernia    large   History of chicken pox    Hx of colposcopy with cervical biopsy    HYPERCHOLESTEROLEMIA 01/13/2007   Hyperglycemia    HYPERTENSION 10/22/2006   Lung nodule    unchanged since  05/26/13    Migraines    NASH (nonalcoholic steatohepatitis)    Nocturnal hypoxemia 02/17/2013   Obesity    Obstructive sleep apnea    OSA on CPAP    per neurology   OSTEOARTHRITIS 10/22/2006   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   Pre-diabetes    Sedimentation rate elevation    Sleep apnea    on cpap   Urine incontinence    UTI (urinary tract infection)     PAST SURGICAL HISTORY: Past Surgical History:  Procedure Laterality Date   BREAST SURGERY  1984   Breast reduction b/l    CATARACT EXTRACTION, BILATERAL     DEXA  08/2005   DILATION AND CURETTAGE OF UTERUS     ELECTROCARDIOGRAM  10/15/2006   ESOPHAGOGASTRODUODENOSCOPY  12/08/2005   FOOT SURGERY     hammertoe and bunion 05/2017    Stress Cardiolite  10/21/2005   sweat gland removal     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   Procedure: Left TOTAL KNEE ARTHROPLASTY;  Surgeon: Vernetta Lonni GRADE, MD;  Location: [facility] OR;  Service: Orthopedics;  Laterality: Left;   UMBILICAL HERNIA REPAIR N/A 04/25/2022   Procedure: OPEN INCARCERATED HERNIA REPAIR UMBILICAL WITH APPENDECTOMY;  Surgeon: Lyndel Deward PARAS, MD;  Location: [facility] OR;  Service: General;  Laterality: N/A;   WISDOM TOOTH EXTRACTION      FAMILY HISTORY: Family History  Problem Relation Age of Onset   Asthma Mother    Depression Mother    Bipolar disorder Mother    Dementia Mother    Arthritis Mother    Breast cancer Mother        primary   Colon cancer Mother        mets from breast   Cancer Mother        colon   Hypertension Father    Migraines Father    Cancer  Maternal Aunt        ?   Heart disease Maternal Grandmother    Cancer Maternal Grandfather        stomach ?   Sleep apnea Neg Hx    Esophageal cancer Neg Hx    Stomach cancer Neg Hx    Rectal cancer Neg Hx     SOCIAL HISTORY: Social History   Socioeconomic History   Marital status: Married    Spouse name: Rodgers   Number of children: 2   Years of education:  [employer] education level: Bachelor's degree (e.g., BA, AB, BS)  Occupational History   Occupation: Retired  Tobacco Use   Smoking status: Former    Current packs/day: 0.00    Average packs/day: 2.0 packs/day for 20.0 years (40.0 ttl pk-yrs)    Types: Cigarettes    Start date: 03/03/1959    Quit date: 03/03/1979    Years since quitting: 44.8    Passive exposure: Past   Smokeless tobacco: Never  Vaping Use   Vaping status: Never Used  Substance and Sexual Activity   Alcohol use: Yes    Comment: rare - TWICE A YR   Drug use: No   Sexual activity: Yes    Birth control/protection: Surgical  Other Topics Concern   Not on file  Social History Narrative   Patient is married Teretha).   Right Handed   Drinks 1 cup caffeine   Social Drivers of Corporate investment banker Strain: Low Risk  (11/15/2023)   Overall Financial Resource Strain (CARDIA)    Difficulty of Paying Living Expenses: Not hard at all  Food Insecurity: No Food Insecurity (11/15/2023)   Hunger Vital Sign    Worried About Running Out of Food in the Last Year: Never true    Ran Out of Food in the Last Year: Never true  Transportation Needs: No Transportation Needs (11/15/2023)   PRAPARE - Administrator, Civil Service (Medical): No    Lack of Transportation (Non-Medical): No  Physical Activity: Insufficiently Active (11/15/2023)   Exercise Vital Sign    Days of Exercise per Week: 3 days    Minutes of Exercise per Session: 40 min  Stress: No Stress Concern Present (11/15/2023)   [employer] of Occupational Health - Occupational Stress Questionnaire    Feeling of Stress: Only a little  Social Connections: Socially Integrated (11/15/2023)   Social Connection and Isolation Panel    Frequency of Communication with Friends and Family: More than three times a week    Frequency of Social Gatherings with Friends and Family: Once a week    Attends Religious Services: More than 4 times per year     Active Member of [employer] or Organizations: Yes    Attends Engineer, structural: More than 4 times per year    Marital Status: Married  Catering manager Violence: Not At Risk (05/27/2023)   Humiliation, Afraid, Rape, and Kick questionnaire    Fear of Current or Ex-Partner: No    Emotionally Abused: No    Physically Abused: No    Sexually Abused: No   PHYSICAL EXAM  Vitals:   12/15/23 1042  BP: (!) 142/70  Pulse: 61  Weight: 202 lb 6.4 oz (91.8 kg)  Height: 5' 4 (1.626 m)   Body mass index is 34.74 kg/m.  Generalized: Well developed, in no acute distress     12/14/2022   11:13 AM 12/16/2021    1:46 PM 01/14/2021  11:37 AM  MMSE - Mini Mental State Exam  Orientation to time 5 5 4   Orientation to Place 5 5 5   Registration 3 3 3   Attention/ Calculation 5 5 4   Recall 3 3 3   Language- name 2 objects 2 2 2   Language- repeat 1 1 1   Language- follow 3 step command 3 3 3   Language- read & follow direction 1 1 1   Write a sentence 1 1 1   Copy design 1 1 1   Total score 30 30 28    Neurological examination  Mentation: Alert oriented to time, place, history taking. Follows all commands speech and language fluent.   Cranial nerve II-XII: Pupils were equal round reactive to light. Extraocular movements were full, visual field were full on confrontational test. Facial sensation and strength were normal. Head turning and shoulder shrug  were normal and symmetric. Motor: Good strength all extremities Sensory: Sensory testing is intact to soft touch on all 4 extremities. No evidence of extinction is noted.  Coordination: Cerebellar testing reveals good finger-nose-finger and heel-to-shin bilaterally.  Gait and station: Gait is wide-based, cautious but independent  DIAGNOSTIC DATA (LABS, IMAGING, TESTING) - I reviewed patient records, labs, notes, testing and imaging myself where available.  Lab Results  Component Value Date   WBC 3.3 (L) 11/17/2023   HGB 12.1 11/17/2023    HCT 36.9 11/17/2023   MCV 94.2 11/17/2023   PLT 145.0 (L) 11/17/2023      Component Value Date/Time   NA 142 11/17/2023 1420   NA 143 07/22/2023 1144   K 4.1 11/17/2023 1420   CL 108 11/17/2023 1420   CO2 26 11/17/2023 1420   GLUCOSE 84 11/17/2023 1420   BUN 13 11/17/2023 1420   BUN 14 07/22/2023 1144   CREATININE 0.74 11/17/2023 1420   CREATININE 0.80 12/07/2019 1246   CREATININE 0.81 12/28/2011 0853   CALCIUM  8.8 11/17/2023 1420   PROT 7.1 11/17/2023 1420   PROT 7.1 07/22/2023 1144   ALBUMIN 3.7 11/17/2023 1420   ALBUMIN 4.1 07/22/2023 1144   AST 23 11/17/2023 1420   AST 19 12/07/2019 1246   ALT 19 11/17/2023 1420   ALT 20 12/07/2019 1246   ALKPHOS 45 11/17/2023 1420   BILITOT 0.5 11/17/2023 1420   BILITOT 0.2 07/22/2023 1144   BILITOT 0.3 12/07/2019 1246   GFRNONAA >60 06/12/2023 1250   GFRNONAA >60 12/07/2019 1246   GFRAA 100 10/09/2019 1425   Lab Results  Component Value Date   CHOL 128 11/17/2023   HDL 56.80 11/17/2023   LDLCALC 61 11/17/2023   TRIG 48.0 11/17/2023   CHOLHDL 2 11/17/2023   Lab Results  Component Value Date   HGBA1C 6.2 11/17/2023   Lab Results  Component Value Date   VITAMINB12 3,179 (H) 12/07/2019   Lab Results  Component Value Date   TSH 0.76 11/17/2023   ASSESSMENT AND PLAN 78 y.o. year old female  has a past medical history of Abdominal hernia, Abnormal Pap smear, ALLERGIC RHINITIS (10/22/2006), Allergy, ANEMIA (12/18/2008), Arthritis, Asthma, ASYMPTOMATIC POSTMENOPAUSAL STATUS (11/22/2007), Back pain, CAD (coronary artery disease), CHF (congestive heart failure) (HCC), Complication of anesthesia (2010), Degenerative arthritis, Diverticulosis, Dyslipidemia, Fibroid, Frequent headaches, GERD (07/20/2007), GOITER, MULTINODULAR (07/20/2007), Headache(784.0) (07/20/2007), HEARING LOSS (11/22/2007), Hiatal hernia, Hiatal hernia, History of chicken pox, colposcopy with cervical biopsy, HYPERCHOLESTEROLEMIA (01/13/2007), Hyperglycemia,  HYPERTENSION (10/22/2006), Lung nodule, Migraines, NASH (nonalcoholic steatohepatitis), Nocturnal hypoxemia (02/17/2013), Obesity, Obstructive sleep apnea, OSA on CPAP, OSTEOARTHRITIS (10/22/2006), Other chronic nonalcoholic liver disease (07/20/2007), Pre-diabetes, Sedimentation rate  elevation, Sleep apnea, Urine incontinence, and UTI (urinary tract infection). here with:  Reported memory disturbance, mild cognitive impairment -Stable, previous MMSE 30/30 -Remains active, independent  Migraine headache -Under excellent control, continue Topamax  75 mg nightly, when tried to wean had increase in headache  OSA on BIPAP (setup August 2020) -Overall excellent compliance, machine is 77 years old, will order HST to qualify for new BIPAP machine -Continue current settings, send supplies as needed.  Recommend continue nightly usage for minimum of 4 hours. -Follow-up in 1 year or sooner if needed  Lauraine Gayland MANDES, DNP 12/15/2023, 10:56 AM [facility] [address] [place], [place] [address] [phone]

## 2023-12-15 NOTE — Progress Notes (Signed)
 SABRA

## 2023-12-29 DIAGNOSIS — Z1231 Encounter for screening mammogram for malignant neoplasm of breast: Secondary | ICD-10-CM | POA: Diagnosis not present

## 2024-01-05 ENCOUNTER — Encounter (INDEPENDENT_AMBULATORY_CARE_PROVIDER_SITE_OTHER): Payer: Self-pay | Admitting: Family Medicine

## 2024-01-05 ENCOUNTER — Ambulatory Visit (INDEPENDENT_AMBULATORY_CARE_PROVIDER_SITE_OTHER): Payer: Self-pay | Admitting: Family Medicine

## 2024-01-05 VITALS — BP 151/79 | HR 58 | Temp 97.5°F | Ht 64.0 in | Wt 201.0 lb

## 2024-01-05 DIAGNOSIS — R030 Elevated blood-pressure reading, without diagnosis of hypertension: Secondary | ICD-10-CM

## 2024-01-05 DIAGNOSIS — E669 Obesity, unspecified: Secondary | ICD-10-CM

## 2024-01-05 DIAGNOSIS — Z6834 Body mass index (BMI) 34.0-34.9, adult: Secondary | ICD-10-CM | POA: Diagnosis not present

## 2024-01-05 NOTE — Progress Notes (Signed)
 Office: [phone]  /  Fax: [phone]  WEIGHT SUMMARY AND BIOMETRICS  Anthropometric Measurements Height: 5' 4 (1.626 m) Weight: 201 lb (91.2 kg) BMI (Calculated): 34.48 Weight at Last Visit: 201 lb Weight Lost Since Last Visit: 0 Weight Gained Since Last Visit: 0 Starting Weight: 268 lb Total Weight Loss (lbs): 67 lb (30.4 kg) Peak Weight: 268 lb   Body Composition  Body Fat %: 46.5 % Fat Mass (lbs): 93.4 lbs Muscle Mass (lbs): 102.2 lbs Total Body Water (lbs): 83.6 lbs Visceral Fat Rating : 15   Other Clinical Data Fasting: no Labs: no Today's Visit #: 99 Starting Date: 12/23/16    Chief Complaint: OBESITY    History of Present Illness Heather [name] is a 78 year old female who presents for a follow-up on her weight management treatment.  She is adhering to a category two eating plan and a journaling plan with a target of 1200 calories and 75 or more grams of protein about 50% of the time. Efforts are being made to increase vegetable intake, consume adequate protein, and maintain hydration. She generally sleeps 7 to 9 hours per night but is skipping meals. Exercise includes using the elliptical for 30 or more minutes, 4 to 5 days a week. Despite these efforts, her weight has remained stable over the last month since her last visit.  Her blood pressure was elevated today, and a recheck showed it remained elevated. She is not on any antihypertensives but takes Lasix  20 mg as needed. Blood pressure has been intermittently elevated over the year. Home readings are lower, sometimes as low as 109/58, and she checks it twice a day. No symptoms of lightheadedness or dizziness are present.  She recently helped plant trees with her church group, which she found enjoyable and non-stressful. She spent a week at her daughter's house without gaining weight, bringing her own homemade soup, salads, and breakfast items. Frustration is noted with discontinued food products she  likes, such as Italian 40 calorie bread and [employer] no sugar jelly in flavors other than strawberry.  She is experiencing a stressful week due to her partner, Macario, undergoing a defibrillator procedure. Macario is very anxious about the procedure, and she is trying to support him by focusing on preparations and keeping him busy. She is also managing her own stress by controlling her food portions, such as having ice cream in small amounts.      PHYSICAL EXAM:  Blood pressure (!) 151/79, pulse (!) 58, temperature (!) 97.5 F (36.4 C), height 5' 4 (1.626 m), weight 201 lb (91.2 kg), SpO2 100%. Body mass index is 34.5 kg/m.  DIAGNOSTIC DATA REVIEWED:  BMET    Component Value Date/Time   NA 142 11/17/2023 1420   NA 143 07/22/2023 1144   K 4.1 11/17/2023 1420   CL 108 11/17/2023 1420   CO2 26 11/17/2023 1420   GLUCOSE 84 11/17/2023 1420   BUN 13 11/17/2023 1420   BUN 14 07/22/2023 1144   CREATININE 0.74 11/17/2023 1420   CREATININE 0.80 12/07/2019 1246   CREATININE 0.81 12/28/2011 0853   CALCIUM  8.8 11/17/2023 1420   GFRNONAA >60 06/12/2023 1250   GFRNONAA >60 12/07/2019 1246   GFRAA 100 10/09/2019 1425   Lab Results  Component Value Date   HGBA1C 6.2 11/17/2023   HGBA1C 6.2 (H) 10/06/2006   Lab Results  Component Value Date   INSULIN  7.3 07/22/2023   INSULIN  20.9 12/23/2016   Lab Results  Component Value Date  TSH 0.76 11/17/2023   CBC    Component Value Date/Time   WBC 3.3 (L) 11/17/2023 1420   RBC 3.91 11/17/2023 1420   HGB 12.1 11/17/2023 1420   HGB 12.0 04/14/2021 1140   HCT 36.9 11/17/2023 1420   HCT 38.3 04/14/2021 1140   PLT 145.0 (L) 11/17/2023 1420   PLT 238 04/14/2021 1140   MCV 94.2 11/17/2023 1420   MCV 94 04/14/2021 1140   MCH 31.7 06/12/2023 1250   MCHC 32.9 11/17/2023 1420   RDW 15.5 11/17/2023 1420   RDW 13.8 04/14/2021 1140   Iron Studies    Component Value Date/Time   IRON 32 (L) 11/17/2023 1420   IRON 42 04/14/2021 1140   TIBC  203.0 (L) 11/17/2023 1420   TIBC 231 (L) 04/14/2021 1140   FERRITIN 220.2 11/17/2023 1420   FERRITIN 338 (H) 04/14/2021 1140   IRONPCTSAT 15.8 (L) 11/17/2023 1420   IRONPCTSAT 18 04/14/2021 1140   IRONPCTSAT 17 04/26/2018 1602   Lipid Panel     Component Value Date/Time   CHOL 128 11/17/2023 1420   CHOL 149 11/25/2020 1131   TRIG 48.0 11/17/2023 1420   HDL 56.80 11/17/2023 1420   HDL 62 11/25/2020 1131   CHOLHDL 2 11/17/2023 1420   VLDL 9.6 11/17/2023 1420   LDLCALC 61 11/17/2023 1420   LDLCALC 73 11/25/2020 1131   Hepatic Function Panel     Component Value Date/Time   PROT 7.1 11/17/2023 1420   PROT 7.1 07/22/2023 1144   ALBUMIN 3.7 11/17/2023 1420   ALBUMIN 4.1 07/22/2023 1144   AST 23 11/17/2023 1420   AST 19 12/07/2019 1246   ALT 19 11/17/2023 1420   ALT 20 12/07/2019 1246   ALKPHOS 45 11/17/2023 1420   BILITOT 0.5 11/17/2023 1420   BILITOT 0.2 07/22/2023 1144   BILITOT 0.3 12/07/2019 1246   BILIDIR 0.1 02/15/2015 1010   IBILI 0.2 12/28/2011 0853      Component Value Date/Time   TSH 0.76 11/17/2023 1420   Nutritional Lab Results  Component Value Date   VD25OH 39.40 11/17/2023   VD25OH 83.2 07/22/2023   VD25OH 89.5 10/28/2022     Assessment and Plan Assessment & Plan Obesity Management is ongoing with a focus on dietary modifications and exercise. She follows a 1200 calorie diet with 75 grams of protein and exercises on the elliptical for 30-40 minutes, 5 days a week. Weight has been stable over the last month. She is attempting to increase vegetable intake and hydration while maintaining adequate protein consumption. Skipping meals may hinder weight loss efforts. - Continue current dietary plan with 1200 calories and 75 grams of protein. - Encouraged regular exercise on the elliptical for 30-40 minutes, 5 days a week. - Advised against skipping meals to support weight loss efforts.  Elevated blood pressure without hypertension (white coat  syndrome) Blood pressure is elevated in the office setting, consistent with white coat syndrome. Home blood pressure readings are normal, with occasional low readings possibly indicating dehydration. No symptoms of lightheadedness or dizziness reported. - Continue monitoring blood pressure at home twice daily. - Ensure adequate hydration to prevent low blood pressure readings. - Scheduled follow-up appointment in January.     I personally spent a total of 34 minutes in the care of the patient today including preparing to see the patient, performing a medically appropriate evaluation of current problems, placing orders in the EMR, documenting clinical information in the EMR, customized nutritional counseling for their specific health and social needs,  and discussing strategies to help avoid social eating challenges.  Cyara was counseled on the importance of maintaining healthy lifestyle habits, including balanced nutrition, regular physical activity, and behavioral modifications, while taking antiobesity medication.  Patient verbalized understanding that medication is an adjunct to, not a replacement for, lifestyle changes and that the long-term success and weight maintenance depend on continued adherence to these strategies.   Catherina was informed of the importance of frequent follow up visits to maximize her success with intensive lifestyle modifications for her obesity and obesity related health conditions as recommended by USPSTF and CMS guidelines   Louann Penton, MD

## 2024-01-12 ENCOUNTER — Ambulatory Visit (INDEPENDENT_AMBULATORY_CARE_PROVIDER_SITE_OTHER): Admitting: Neurology

## 2024-01-12 DIAGNOSIS — G4733 Obstructive sleep apnea (adult) (pediatric): Secondary | ICD-10-CM | POA: Diagnosis not present

## 2024-01-12 DIAGNOSIS — E6609 Other obesity due to excess calories: Secondary | ICD-10-CM

## 2024-01-12 DIAGNOSIS — R600 Localized edema: Secondary | ICD-10-CM

## 2024-01-13 ENCOUNTER — Ambulatory Visit: Admitting: Podiatry

## 2024-01-13 NOTE — Progress Notes (Signed)
 [facility]  [facility] SLEEP TEST REPORT ( by Watch PAT)   STUDY DATE:  01-12-2024    ORDERING CLINICIAN: Lauraine Born, NP at [facility]  REFERRING CLINICIAN:  weight and wellness    CLINICAL INFORMATION/HISTORY: 12/15/23 12/15/23 SS: BIPAP shows 100% compliance, 12/8 cm water, leak 43, AHI 0.3. Memory is all there, just can't access it quickly, knows the answer, may take her extra time. Is independent, drives, does what she wants. No headaches, remains on Topamax  75 mg daily. The head gear elastic on her BIPAP stretches overtime, causes leaking of nasal mask. BIPAP setup Aug 2020.   12/14/22 SS: Had SBO in Feb, has recovered well. MMSE 30/30 today. Thinks short term memory is age related, takes her longer to recall but she can. Remains on Topamax  75 mg at bedtime, no headaches. Wishes to remain on.  BiPAP usage is superb at 100%, AHI 0.3, leak 46.4, IPAP 12 EPAP 8 cm water.  Remains very active, lives with her husband, continues to be involved with [employer].  02/08/2020 Dr. Jenel: [name] is a 78 year old right-handed black female with a history of sleep apnea and diabetes.  The patient is on CPAP.  She reports a 5 to 6-year history of some memory issues that have gradually changed over time.  She mainly reports difficulty with word finding, and difficulty remembering names for people.  She has a history of migraine headaches, she has been on Topamax  for a number of years but seems to tolerate this fairly well.  She currently is on 100 mg daily of Topamax     Epworth sleepiness score: X/24.   BMI: 34. 7 kg/m   Neck Circumference: X    Sleep Summary:   Total Recording Time (hours, min):   7 h 21 m     Total Sleep Time (hours, min):   6 h 26 m              Percent REM (%):  17%                                      Respiratory Indices:   Calculated pAHI (per  CMS guideline):8.4/h                         REM pAHI:     32.4 /h                                              NREM pAHI:   3.5/h                            Positional AHI:    all supine   Snoring:    41 dB mean Volume                                             Oxygen  Saturation Statistics:   Oxygen  Saturation (%) Mean:   93% , between 75 and 97 %  O2 Saturation Range (%).  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     RECOMMENDATION: Continuation of BiPAP auto device therapy . ResMed device : 12/ 8 cm water BiPAP setting, mask of comfort ( nasal mask with chinstrap, please).  REM sleep apnea with hypoxia responds often well to weight loss and Core strength improvement .   RV for compliance with the new device with referring Provider : Lauraine Born, NP .   Any patient should be cautioned not to drive, work at heights, or operate dangerous or heavy equipment when tired or sleepy.   Review of good sleep hygiene measures is accessible to any sleep clinic patient and can be reiterated through online material- I we recommend the Guide to better Sleep   by the NIH.   Weight loss and Core Strength improvement is highly recommended for individuals with low muscle tone and/ or a BMI over 30.  Any CPAP patient should be reminded to be fully compliant with PAP therapy , (defined as using PAP therapy for more than 4 hours each night ) with the goal to improve sleep related symptoms and decrease long term cardiovascular risks. Any PAP therapy patient should be reminded, that it may take up to 3 months to get fully used to using PAP and it may take 1-2 weeks for an established CPAP user to acclimatize to changes in pressure or mask. The earlier full  compliance is achieved, the better long term compliance tends to be.   Please note that untreated obstructive sleep apnea may carry additional perioperative morbidity. Patients with significant obstructive sleep apnea should receive perioperative PAP therapy and the surgical team should be informed of the diagnosis and degree of sleep disordered breathing.  Sleep fragmentation in the presence of normal proportional sleep stages is a nonspecific findings and per se does not signify an intrinsic sleep disorder or a cause for the patient's sleep-related symptoms.  Causes include (but are not limited to) the unfamiliarity of sleeping while recorded by HST device or sleeping in a sleep lab for a full Polysomnography sleep study, but also circadian rhythm disturbances, medication side effects or an underlying mood disorder or medical problem.   The referring physician will be notified of the test results.       INTERPRETING PHYSICIAN:   Dedra Gores, MD  [facility] and [facility] Sleep Board certified by [employer] and Diplomate of the [employer]. Board certified In Neurology through the ABPN, Fellow of the [employer].

## 2024-01-23 NOTE — Procedures (Signed)
 [facility]  [name] SLEEP TEST REPORT ( by Watch PAT)   STUDY DATE:  01-12-2024    ORDERING CLINICIAN: Lauraine Born, NP at [facility]  REFERRING CLINICIAN:  weight and wellness    CLINICAL INFORMATION/HISTORY: 12/15/23 12/15/23 SS: BIPAP shows 100% compliance, 12/8 cm water, leak 43, AHI 0.3. Memory is all there, just can't access it quickly, knows the answer, may take her extra time. Is independent, drives, does what she wants. No headaches, remains on Topamax  75 mg daily. The head gear elastic on her BIPAP stretches overtime, causes leaking of nasal mask. BIPAP setup Aug 2020.   12/14/22 SS: Had SBO in Feb, has recovered well. MMSE 30/30 today. Thinks short term memory is age related, takes her longer to recall but she can. Remains on Topamax  75 mg at bedtime, no headaches. Wishes to remain on.  BiPAP usage is superb at 100%, AHI 0.3, leak 46.4, IPAP 12 EPAP 8 cm water.  Remains very active, lives with her husband, continues to be involved with [employer].  02/08/2020 Dr. Jenel: Heather [name] is a 78 year old right-handed black female with a history of sleep apnea and diabetes.  The patient is on CPAP.  She reports a 5 to 6-year history of some memory issues that have gradually changed over time.  She mainly reports difficulty with word finding, and difficulty remembering names for people.  She has a history of migraine headaches, she has been on Topamax  for a number of years but seems to tolerate this fairly well.  She currently is on 100 mg daily of Topamax     Epworth sleepiness score: X/24.   BMI: 34. 7 kg/m   Neck Circumference: X    Sleep Summary:   Total Recording Time (hours, min):   7 h 21 m     Total Sleep Time (hours, min):   6 h 26 m              Percent REM (%):  17%                                      Respiratory Indices:   Calculated pAHI (per  CMS guideline):8.4/h                         REM pAHI:     32.4 /h                                              NREM pAHI:   3.5/h                            Positional AHI:    all supine   Snoring:    41 dB mean Volume                                             Oxygen  Saturation Statistics:   Oxygen  Saturation (%) Mean:   93% , between 75 and 97 %  O2 Saturation Range (%).  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     RECOMMENDATION: Continuation of BiPAP auto device therapy . ResMed device : 12/ 8 cm water BiPAP setting, mask of comfort ( nasal mask with chinstrap, please).  REM sleep apnea with hypoxia responds often well to weight loss and Core strength improvement .   RV for compliance with the new device with referring Provider : Lauraine Born, NP .   Any patient should be cautioned not to drive, work at heights, or operate dangerous or heavy equipment when tired or sleepy.   Review of good sleep hygiene measures is accessible to any sleep clinic patient and can be reiterated through online material- I we recommend the Guide to better Sleep   by the NIH.   Weight loss and Core Strength improvement is highly recommended for individuals with low muscle tone and/ or a BMI over 30.  Any CPAP patient should be reminded to be fully compliant with PAP therapy , (defined as using PAP therapy for more than 4 hours each night ) with the goal to improve sleep related symptoms and decrease long term cardiovascular risks. Any PAP therapy patient should be reminded, that it may take up to 3 months to get fully used to using PAP and it may take 1-2 weeks for an established CPAP user to acclimatize to changes in pressure or mask. The earlier full  compliance is achieved, the better long term compliance tends to be.   Please note that untreated obstructive sleep apnea may carry additional perioperative morbidity. Patients with significant obstructive sleep apnea should receive perioperative PAP therapy and the surgical team should be informed of the diagnosis and degree of sleep disordered breathing.  Sleep fragmentation in the presence of normal proportional sleep stages is a nonspecific findings and per se does not signify an intrinsic sleep disorder or a cause for the patient's sleep-related symptoms.  Causes include (but are not limited to) the unfamiliarity of sleeping while recorded by HST device or sleeping in a sleep lab for a full Polysomnography sleep study, but also circadian rhythm disturbances, medication side effects or an underlying mood disorder or medical problem.   The referring physician will be notified of the test results.       INTERPRETING PHYSICIAN:   Dedra Gores, MD  [facility] and [facility] Sleep Board certified by [employer] and Diplomate of the [employer]. Board certified In Neurology through the ABPN, Fellow of the [employer].

## 2024-01-24 ENCOUNTER — Ambulatory Visit: Payer: Self-pay | Admitting: Neurology

## 2024-01-24 DIAGNOSIS — G4733 Obstructive sleep apnea (adult) (pediatric): Secondary | ICD-10-CM

## 2024-01-24 NOTE — Telephone Encounter (Signed)
 Please call, orders for new BIPAP machine placed. Had HST 01/12/24 showing mild OSA strongly REM dependent and associated with relevant degree of hypoxia.   Continuation of BiPAP auto device therapy . ResMed device : 12/ 8 cm water BiPAP setting, mask of comfort ( nasal mask with chinstrap, please).

## 2024-01-25 ENCOUNTER — Ambulatory Visit (INDEPENDENT_AMBULATORY_CARE_PROVIDER_SITE_OTHER): Admitting: Podiatry

## 2024-01-25 DIAGNOSIS — D2371 Other benign neoplasm of skin of right lower limb, including hip: Secondary | ICD-10-CM | POA: Diagnosis not present

## 2024-01-25 DIAGNOSIS — B351 Tinea unguium: Secondary | ICD-10-CM

## 2024-01-25 DIAGNOSIS — M79676 Pain in unspecified toe(s): Secondary | ICD-10-CM | POA: Diagnosis not present

## 2024-01-25 DIAGNOSIS — D2372 Other benign neoplasm of skin of left lower limb, including hip: Secondary | ICD-10-CM | POA: Diagnosis not present

## 2024-01-25 DIAGNOSIS — D689 Coagulation defect, unspecified: Secondary | ICD-10-CM | POA: Diagnosis not present

## 2024-01-25 NOTE — Progress Notes (Signed)
 She presents today chief complaint of painful elongated toenails 1 through 5 bilateral.  Also painful calluses.  Objective: Pulses are palpable no open lesions or wounds are noted.  Skin is supple and well-hydrated toenails are thick yellow dystrophic clinically mycotic with benign skin lesions plantar aspect of the forefoot.  Assessment: Pain limb secondary onychomycosis pain limb secondary to benign skin lesions.  Plan: Debridement of toenails 1 through 5 bilateral debridement benign skin lesions.

## 2024-02-02 ENCOUNTER — Ambulatory Visit (INDEPENDENT_AMBULATORY_CARE_PROVIDER_SITE_OTHER): Payer: PRIVATE HEALTH INSURANCE | Admitting: Family Medicine

## 2024-02-02 NOTE — Progress Notes (Unsigned)
 [name] [address] [place] [address] Phone: [phone] Subjective:   ISusannah Gully, am serving as a scribe for Dr. Arthea Claudene.  I'm seeing this patient by the request  of:  Geofm Glade PARAS, MD  CC: Bilateral knee, low back pain  YEP:Dlagzrupcz  11/04/2023 Continues to intermittently have this. Has been suffering with it off and on for 7 to 8 years. We discussed though that it did seem to be this sleep apnea and some iron deficiency initially. Patient is supplementing appropriately when necessary, patient is wearing her CPAP regularly but it is time to have it further evaluated again. Discussed which activities to do and which ones to avoid otherwise. Increase activity slowly. Follow-up again in 6 to 12 weeks   Update 02/03/2024 EMMAGRACE RUNKEL is a 78 y.o. female coming in with complaint of B knee and lumbar spine pain. Patient states since the weather has cooled down that achy is now pain in knees. R knee is the worse. Back pain as well. R knee has more instability than usual.      Past Medical History:  Diagnosis Date   Abdominal hernia    Abnormal Pap smear    ALLERGIC RHINITIS 10/22/2006   Allergy    ANEMIA 12/18/2008   Arthritis    scoliosis moderate deg changes lumbar Xray 11/04/17    Asthma    ASYMPTOMATIC POSTMENOPAUSAL STATUS 11/22/2007   Back pain    CAD (coronary artery disease)    in LAD   CHF (congestive heart failure) (HCC)    Complication of anesthesia 2010   pt states she woke up during colonoscopy and was aware of losing control of breathing during mammoplasty   Degenerative arthritis    Diverticulosis    Dyslipidemia    Fibroid    Frequent headaches    GERD 07/20/2007   GOITER, MULTINODULAR 07/20/2007   Headache(784.0) 07/20/2007   HEARING LOSS 11/22/2007   Hiatal hernia    Hiatal hernia    large   History of chicken pox    Hx of colposcopy with cervical biopsy    HYPERCHOLESTEROLEMIA 01/13/2007    Hyperglycemia    HYPERTENSION 10/22/2006   Lung nodule    unchanged since 05/26/13    Migraines    NASH (nonalcoholic steatohepatitis)    Nocturnal hypoxemia 02/17/2013   Obesity    Obstructive sleep apnea    OSA on CPAP    per neurology   OSTEOARTHRITIS 10/22/2006   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   Pre-diabetes    Sedimentation rate elevation    Sleep apnea    on cpap   Urine incontinence    UTI (urinary tract infection)    Past Surgical History:  Procedure Laterality Date   BREAST SURGERY  1984   Breast reduction b/l    CATARACT EXTRACTION, BILATERAL     DEXA  08/2005   DILATION AND CURETTAGE OF UTERUS     ELECTROCARDIOGRAM  10/15/2006   ESOPHAGOGASTRODUODENOSCOPY  12/08/2005   FOOT SURGERY     hammertoe and bunion 05/2017    Stress Cardiolite  10/21/2005   sweat gland removal     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   Procedure: Left TOTAL KNEE ARTHROPLASTY;  Surgeon: Vernetta Lonni GRADE, MD;  Location: [facility] OR;  Service: Orthopedics;  Laterality: Left;   UMBILICAL HERNIA REPAIR N/A 04/25/2022   Procedure: OPEN INCARCERATED HERNIA REPAIR UMBILICAL WITH APPENDECTOMY;  Surgeon: Lyndel Deward PARAS, MD;  Location:  [facility] OR;  Service: General;  Laterality: N/A;   WISDOM TOOTH EXTRACTION     Social History   Socioeconomic History   Marital status: Married    Spouse name: Rodgers   Number of children: 2   Years of education: College   Highest education level: Bachelor's degree (e.g., BA, AB, BS)  Occupational History   Occupation: Retired  Tobacco Use   Smoking status: Former    Current packs/day: 0.00    Average packs/day: 2.0 packs/day for 20.0 years (40.0 ttl pk-yrs)    Types: Cigarettes    Start date: 03/03/1959    Quit date: 03/03/1979    Years since quitting: 44.9    Passive exposure: Past   Smokeless tobacco: Never  Vaping Use   Vaping status: Never Used  Substance and Sexual Activity   Alcohol use: Yes    Comment: rare - TWICE A YR    Drug use: No   Sexual activity: Yes    Birth control/protection: Surgical  Other Topics Concern   Not on file  Social History Narrative   Patient is married Teretha).   Right Handed   Drinks 1 cup caffeine   Social Drivers of Corporate Investment Banker Strain: Low Risk  (11/15/2023)   Overall Financial Resource Strain (CARDIA)    Difficulty of Paying Living Expenses: Not hard at all  Food Insecurity: No Food Insecurity (11/15/2023)   Hunger Vital Sign    Worried About Running Out of Food in the Last Year: Never true    Ran Out of Food in the Last Year: Never true  Transportation Needs: No Transportation Needs (11/15/2023)   PRAPARE - Administrator, Civil Service (Medical): No    Lack of Transportation (Non-Medical): No  Physical Activity: Insufficiently Active (11/15/2023)   Exercise Vital Sign    Days of Exercise per Week: 3 days    Minutes of Exercise per Session: 40 min  Stress: No Stress Concern Present (11/15/2023)   [employer] of Occupational Health - Occupational Stress Questionnaire    Feeling of Stress: Only a little  Social Connections: Socially Integrated (11/15/2023)   Social Connection and Isolation Panel    Frequency of Communication with Friends and Family: More than three times a week    Frequency of Social Gatherings with Friends and Family: Once a week    Attends Religious Services: More than 4 times per year    Active Member of [employer] or Organizations: Yes    Attends Engineer, Structural: More than 4 times per year    Marital Status: Married   Allergies  Allergen Reactions   Aspirin Hives   Coconut (Cocos Nucifera) Hives   Lisinopril Cough   Metoprolol  Itching   Other     Latex paint, the smell causes hives. Not allergic to latex gloves   General anesthesia - has had issues in past where anesthesia was not properly administered and caused side effects   Peanut-Containing Drug Products Hives, Itching and Other (See Comments)    Prednisone     Unknown reaction    Strawberry Extract Hives   Penicillins Rash   Family History  Problem Relation Age of Onset   Asthma Mother    Depression Mother    Bipolar disorder Mother    Dementia Mother    Arthritis Mother    Breast cancer Mother        primary   Colon cancer Mother        mets  from breast   Cancer Mother        colon   Hypertension Father    Migraines Father    Cancer Maternal Aunt        ?   Heart disease Maternal Grandmother    Cancer Maternal Grandfather        stomach ?   Sleep apnea Neg Hx    Esophageal cancer Neg Hx    Stomach cancer Neg Hx    Rectal cancer Neg Hx      Current Outpatient Medications (Cardiovascular):    ezetimibe  (ZETIA ) 10 MG tablet, Take 1 tablet (10 mg total) by mouth daily.   furosemide  (LASIX ) 20 MG tablet, Take 1 tablet (20 mg total) by mouth daily. Take twice daily as needed for increased swelling   rosuvastatin  (CRESTOR ) 40 MG tablet, TAKE 1 TABLET DAILY AT NIGHT  Current Outpatient Medications (Respiratory):    cetirizine (ZYRTEC) 10 MG tablet, Take 10 mg by mouth daily.  Current Outpatient Medications (Analgesics):    acetaminophen  (TYLENOL ) 500 MG tablet, Take 1,000 mg by mouth 3 (three) times daily as needed for moderate pain.   naproxen sodium (ALEVE) 220 MG tablet, Take 220 mg by mouth as needed.  Current Outpatient Medications (Hematological):    clopidogrel  (PLAVIX ) 75 MG tablet, TAKE 1 TABLET DAILY   Cyanocobalamin  1000 MCG CAPS, Take 1 capsule by mouth daily.   Ferrous Sulfate  (IRON) 325 (65 FE) MG TABS, Take 1 tablet by mouth daily.  Current Outpatient Medications (Other):    Boswellia-Glucosamine-Vit D (OSTEO BI-FLEX ONE PER DAY PO), Take 1 tablet by mouth daily.   Calcium  Carb-Cholecalciferol (CHEWABLE CALCIUM /D3 PO), Take 1 tablet by mouth daily.   Coenzyme Q10 (CO Q 10 PO), Take 400 mg by mouth daily.   DULoxetine  (CYMBALTA ) 20 MG capsule, TAKE 1 CAPSULE BY MOUTH EVERY DAY   gabapentin   (NEURONTIN ) 100 MG capsule, TAKE 3 CAPSULES AT BEDTIME   KLOR-CON  M20 20 MEQ tablet, TAKE 1 TABLET DAILY   Multiple Vitamin (MULTIVITAMIN) tablet, Take 1 tablet by mouth daily.   Omega-3 Fatty Acids (FISH OIL PO), Take 2 g by mouth daily.   pantoprazole  (PROTONIX ) 20 MG tablet, TAKE 1 TABLET TWICE A DAY, 30 MINUTES BEFORE FOOD   polyethylene glycol (MIRALAX  / GLYCOLAX ) 17 g packet, Take 17 g by mouth every other day.   pyridoxine  (B-6) 200 MG tablet, Take 200 mg by mouth daily.   solifenacin  (VESICARE ) 10 MG tablet, Take 1 tablet (10 mg total) by mouth daily.   topiramate  (TOPAMAX ) 25 MG tablet, Take 3 tablets (75 mg total) by mouth at bedtime.   TURMERIC PO, Take 538 mg by mouth daily.   VEVYE 0.1 % SOLN, Apply 1 drop to eye 2 (two) times daily.   Vitamin D , Ergocalciferol , (DRISDOL ) 1.25 MG (50000 UNIT) CAPS capsule, TAKE EVERY 2 WEEKS   vitamin E  200 UNIT capsule, Take 200 Units by mouth daily.   Reviewed prior external information including notes and imaging from  primary care provider As well as notes that were available from care everywhere and other healthcare systems.  Past medical history, social, surgical and family history all reviewed in electronic medical record.  No pertanent information unless stated regarding to the chief complaint.   Review of Systems:  No headache, visual changes, nausea, vomiting, diarrhea, constipation, dizziness, abdominal pain, skin rash, fevers, chills, night sweats, weight loss, swollen lymph nodes, body aches, joint swelling, chest pain, shortness of breath, mood changes. POSITIVE muscle aches  Objective  Blood pressure 130/80, pulse 67, height 5' 4 (1.626 m), SpO2 97%.   General: No apparent distress alert and oriented x3 mood and affect normal, dressed appropriately.  HEENT: Pupils equal, extraocular movements intact  Respiratory: Patient's speak in full sentences and does not appear short of breath  Antalgic gait noted today.  The right knee  does have significant effusion noted.  Left knee does have the replacement.  Right knee does have instability with valgus and varus force.  Neurovascular intact distally.  Low back does have loss of lordosis noted.  Tenderness to palpation diffusely but seems to be worse over the left sacroiliac joint.   After informed written and verbal consent, patient was seated on exam table. Right knee was prepped with alcohol swab and utilizing anterolateral approach, patient's right knee space was injected with 4:1  marcaine  0.5%: Kenalog  40mg /dL. Patient tolerated the procedure well without immediate complications.   Impression and Recommendations:    The above documentation has been reviewed and is accurate and complete Dorn Hartshorne M Jaris Kohles, DO

## 2024-02-03 ENCOUNTER — Encounter: Payer: Self-pay | Admitting: Family Medicine

## 2024-02-03 ENCOUNTER — Encounter: Payer: Self-pay | Admitting: Neurology

## 2024-02-03 ENCOUNTER — Ambulatory Visit: Admitting: Family Medicine

## 2024-02-03 VITALS — BP 130/80 | HR 67 | Ht 64.0 in

## 2024-02-03 DIAGNOSIS — M5416 Radiculopathy, lumbar region: Secondary | ICD-10-CM | POA: Diagnosis not present

## 2024-02-03 DIAGNOSIS — M25561 Pain in right knee: Secondary | ICD-10-CM | POA: Diagnosis not present

## 2024-02-03 DIAGNOSIS — M51362 Other intervertebral disc degeneration, lumbar region with discogenic back pain and lower extremity pain: Secondary | ICD-10-CM | POA: Diagnosis not present

## 2024-02-03 DIAGNOSIS — G8929 Other chronic pain: Secondary | ICD-10-CM | POA: Diagnosis not present

## 2024-02-03 DIAGNOSIS — M25562 Pain in left knee: Secondary | ICD-10-CM | POA: Diagnosis not present

## 2024-02-03 DIAGNOSIS — M170 Bilateral primary osteoarthritis of knee: Secondary | ICD-10-CM

## 2024-02-03 MED ORDER — METHYLPREDNISOLONE ACETATE 40 MG/ML IJ SUSP
40.0000 mg | Freq: Once | INTRAMUSCULAR | Status: AC
  Administered 2024-02-03: 40 mg via INTRAMUSCULAR

## 2024-02-03 MED ORDER — KETOROLAC TROMETHAMINE 30 MG/ML IJ SOLN
30.0000 mg | Freq: Once | INTRAMUSCULAR | Status: AC
  Administered 2024-02-03: 30 mg via INTRAMUSCULAR

## 2024-02-03 NOTE — Patient Instructions (Addendum)
 Injection in knee today Half cocktail in backside today. No NSAIDs for 24 hours.  2 weeks if not better write us  See you again in 2-3 months

## 2024-02-03 NOTE — Assessment & Plan Note (Signed)
 Severe arthritic changes noted.  Could consider the possibility of another epidural and patient knows in 2 weeks if not better with that we will consider it.  In addition to this we discussed Toradol and Depo-Medrol  injections.  Patient was given those today.  Has had difficulty with aspirin previously but has not had trouble with ibuprofen in the past.  Will see how patient responds.  Patient knows to seek medical attention if any worsening symptoms.  Follow-up again in 2 months

## 2024-02-03 NOTE — Assessment & Plan Note (Signed)
 Patient given injection and tolerated the procedure well, discussed icing regimen and home exercises, increase activity slowly.  Discussed icing regimen.  Patient has done well with weight loss and we will continue to monitor.  Patient is not extremely optimistic with her knee replacement on the left side so does not want to repeat surgical intervention if they can help it.  Follow-up again in 6 to 8 weeks

## 2024-02-04 ENCOUNTER — Encounter: Payer: Self-pay | Admitting: Family Medicine

## 2024-03-06 DIAGNOSIS — G4733 Obstructive sleep apnea (adult) (pediatric): Secondary | ICD-10-CM

## 2024-03-06 NOTE — Telephone Encounter (Signed)
 SENT TO [facility]

## 2024-03-07 NOTE — Telephone Encounter (Signed)
"  ORDER RESENT  "

## 2024-03-07 NOTE — Addendum Note (Signed)
 Addended by: GAYLAND LAURAINE PARAS on: 03/07/2024 01:38 PM   Modules accepted: Orders

## 2024-03-08 NOTE — Telephone Encounter (Signed)
 SABRA

## 2024-03-09 ENCOUNTER — Encounter (INDEPENDENT_AMBULATORY_CARE_PROVIDER_SITE_OTHER): Payer: Self-pay | Admitting: Family Medicine

## 2024-03-09 ENCOUNTER — Telehealth (INDEPENDENT_AMBULATORY_CARE_PROVIDER_SITE_OTHER): Admitting: Family Medicine

## 2024-03-09 VITALS — Ht 64.0 in

## 2024-03-09 DIAGNOSIS — E559 Vitamin D deficiency, unspecified: Secondary | ICD-10-CM

## 2024-03-09 DIAGNOSIS — Z6834 Body mass index (BMI) 34.0-34.9, adult: Secondary | ICD-10-CM

## 2024-03-09 MED ORDER — VITAMIN D (ERGOCALCIFEROL) 1.25 MG (50000 UNIT) PO CAPS: ORAL_CAPSULE | ORAL | 0 refills | Status: AC

## 2024-03-09 NOTE — Progress Notes (Signed)
 "  Office: [phone]  /  Fax: [phone]  WEIGHT SUMMARY AND BIOMETRICS  Anthropometric Measurements Height: 5' 4 (1.626 m) Weight: -- (Video Visit) Weight at Last Visit: 201 lb Starting Weight: 268 lb   No data recorded Other Clinical Data Fasting: N/A Labs: N/A Today's Visit #: 100 Starting Date: 12/23/16 Comments: Heather [name] VIDEO VISIT    Chief Complaint: OBESITY    Office: [phone]  /  Fax: [phone]  WEIGHT SUMMARY AND BIOMETRICS  Anthropometric Measurements Height: 5' 4 (1.626 m) Weight: -- (Video Visit) Weight at Last Visit: 201 lb Starting Weight: 268 lb   No data recorded Other Clinical Data Fasting: N/A Labs: N/A Today's Visit #: 100 Starting Date: 12/23/16 Comments: Heather [name] VIDEO VISIT    Chief Complaint: OBESITY   Virtual Visit via A/V Note  I connected with Heather [name] on 03/09/2024 at 11:00 AM EST by audiovisual telehealth and verified that I am speaking with the correct person using two identifiers.  Location: Patient: home Provider: home   I discussed the limitations, risks, security and privacy concerns of performing an evaluation and management service by AV telehealth and the availability of in person appointments. I also discussed with the patient that there may be a patient responsible charge related to this service. The patient expressed understanding and agreed to proceed.     History of Present Illness Heather [name] is a 79 year old female who presents for obesity treatment.  She has paused her weight loss efforts and is uncertain about any changes in her weight. She is not currently engaging in exercise. She is focusing on hydration and increasing protein intake but is skipping meals. She regularly prepares and consumes soups and salads.  She has a history of hypertension managed with diet, exercise, weight loss, and medication. She monitors her blood pressure at home but does not recall recent readings,  though she believes they are within normal range.  She is being treated for vitamin D  deficiency and requests a refill for a ninety-day supply. Her most recent vitamin D  level was 83 in May of last year. She takes ergocalciferol  50,000 IU every two weeks.  She experienced vertigo following a neck ablation but reports improvement, stating she is fine as long as she is sitting.  She is married and her husband has joined her in exercising. She has adjusted to having one vehicle as her husband is no longer driving.      PHYSICAL EXAM:  Height 5' 4 (1.626 m). Body mass index is 34.5 kg/m.  DIAGNOSTIC DATA REVIEWED:  BMET    Component Value Date/Time   NA 142 11/17/2023 1420   NA 143 07/22/2023 1144   K 4.1 11/17/2023 1420   CL 108 11/17/2023 1420   CO2 26 11/17/2023 1420   GLUCOSE 84 11/17/2023 1420   BUN 13 11/17/2023 1420   BUN 14 07/22/2023 1144   CREATININE 0.74 11/17/2023 1420   CREATININE 0.80 12/07/2019 1246   CREATININE 0.81 12/28/2011 0853   CALCIUM  8.8 11/17/2023 1420   GFRNONAA >60 06/12/2023 1250   GFRNONAA >60 12/07/2019 1246   GFRAA 100 10/09/2019 1425   Lab Results  Component Value Date   HGBA1C 6.2 11/17/2023   HGBA1C 6.2 (H) 10/06/2006   Lab Results  Component Value Date   INSULIN  7.3 07/22/2023   INSULIN  20.9 12/23/2016   Lab Results  Component Value Date   TSH 0.76 11/17/2023   CBC    Component Value Date/Time   WBC 3.3 (  L) 11/17/2023 1420   RBC 3.91 11/17/2023 1420   HGB 12.1 11/17/2023 1420   HGB 12.0 04/14/2021 1140   HCT 36.9 11/17/2023 1420   HCT 38.3 04/14/2021 1140   PLT 145.0 (L) 11/17/2023 1420   PLT 238 04/14/2021 1140   MCV 94.2 11/17/2023 1420   MCV 94 04/14/2021 1140   MCH 31.7 06/12/2023 1250   MCHC 32.9 11/17/2023 1420   RDW 15.5 11/17/2023 1420   RDW 13.8 04/14/2021 1140   Iron Studies    Component Value Date/Time   IRON 32 (L) 11/17/2023 1420   IRON 42 04/14/2021 1140   TIBC 203.0 (L) 11/17/2023 1420   TIBC 231  (L) 04/14/2021 1140   FERRITIN 220.2 11/17/2023 1420   FERRITIN 338 (H) 04/14/2021 1140   IRONPCTSAT 15.8 (L) 11/17/2023 1420   IRONPCTSAT 18 04/14/2021 1140   IRONPCTSAT 17 04/26/2018 1602   Lipid Panel     Component Value Date/Time   CHOL 128 11/17/2023 1420   CHOL 149 11/25/2020 1131   TRIG 48.0 11/17/2023 1420   HDL 56.80 11/17/2023 1420   HDL 62 11/25/2020 1131   CHOLHDL 2 11/17/2023 1420   VLDL 9.6 11/17/2023 1420   LDLCALC 61 11/17/2023 1420   LDLCALC 73 11/25/2020 1131   Hepatic Function Panel     Component Value Date/Time   PROT 7.1 11/17/2023 1420   PROT 7.1 07/22/2023 1144   ALBUMIN 3.7 11/17/2023 1420   ALBUMIN 4.1 07/22/2023 1144   AST 23 11/17/2023 1420   AST 19 12/07/2019 1246   ALT 19 11/17/2023 1420   ALT 20 12/07/2019 1246   ALKPHOS 45 11/17/2023 1420   BILITOT 0.5 11/17/2023 1420   BILITOT 0.2 07/22/2023 1144   BILITOT 0.3 12/07/2019 1246   BILIDIR 0.1 02/15/2015 1010   IBILI 0.2 12/28/2011 0853      Component Value Date/Time   TSH 0.76 11/17/2023 1420   Nutritional Lab Results  Component Value Date   VD25OH 39.40 11/17/2023   VD25OH 83.2 07/22/2023   VD25OH 89.5 10/28/2022     Assessment and Plan Assessment & Plan Morbid obesity She has been off her weight loss program due to stress and life events, resulting in weight gain. She plans to resume her weight loss efforts with a structured plan, including exercise and dietary habits. She has access to an elliptical and plans to exercise with her husband. She has established meal planning habits, including making soup and salads, which are simple and convenient. She is considering continuing with a high-protein plan or plan two, and journaling to aid memory. - Resume weight loss program with structured plan - Engage in regular exercise using elliptical - Maintain meal planning habits with soup and salads - Consider high-protein plan or plan two - Continue journaling 1000-1200 calories and 75+  gm protein  Vitamin D  deficiency Managed with ergocalciferol  50,000 IU every two weeks to avoid over-replacement. Her most recent vitamin D  level was 83 in May of last year. She requests a refill for a 90-day supply, but a six-month supply is more cost-effective. - Prescribed ergocalciferol  50,000 IU every two weeks for six months - Sent prescription to pharmacy      Patients who are on anti-obesity medications are counseled on the importance of maintaining healthy lifestyle habits, including balanced nutrition, regular physical activity, and behavioral modifications,  Medication is an adjunct to, not a replacement for, lifestyle changes and that the long-term success and weight maintenance depend on continued adherence to these strategies.  Anneth was informed of the importance of frequent follow up visits to maximize her success with intensive lifestyle modifications for her obesity and obesity related health conditions as recommended by USPSTF and CMS guidelines  Louann Penton, MD "

## 2024-03-14 ENCOUNTER — Telehealth (INDEPENDENT_AMBULATORY_CARE_PROVIDER_SITE_OTHER): Payer: Self-pay | Admitting: Family Medicine

## 2024-03-14 NOTE — Telephone Encounter (Signed)
 Call to [facility] to make her aware that the prescription had been put on hold, refill was too soon, but she will be able to pick up the prescription today at the cost of #6 tablets for $8.39, per Latoya at the pharmacy.

## 2024-03-14 NOTE — Telephone Encounter (Signed)
 Spoke to Heather [name] at the pharmacy.  The prescription for Vitamin D  was put on hold.  Refill too soon.  She was able to run the prescription today Vitamin D  50,000 IU #6 tablets, price will be $8.39.

## 2024-03-14 NOTE — Telephone Encounter (Signed)
 The patient mentioned that the pharmacy informed her that her Vitamin D  prescription had not been sent over. I clarified that we did send it on March 09, 2024, and the pharmacy confirmed receipt at 11:03 AM. I also verified the pharmacys information. She is using the [facility] located at [address] in [place]. Please look into this matter. Thank you!

## 2024-03-22 NOTE — Telephone Encounter (Signed)
 Called [facility] Rep, Thomasina Colorado to look into why patient has not been set up.   Not sure what or how the confusion has happened over the heated humidifier. But the humidifier comes with the CPAP machine. [facility] does have that and the order is updated in the chart from 03/08/23. Pt has been labeled as an urgent set-up with [facility] and pt will be getting contacted soon.

## 2024-03-22 NOTE — Telephone Encounter (Signed)
 I called [facility] and personally spoke w/Heather [name] since she never got back with me on the humidifier. Ms. Heather [name] stated that they infact do not carry them. Heather stated to check with [facility] in [place] at [phone].

## 2024-03-22 NOTE — Telephone Encounter (Signed)
 I then proceeded to call [facility] at [phone] and they stated that they also do not carry humidifiers. I will route to provider to see if they have recommendations of where to send.

## 2024-03-30 NOTE — Progress Notes (Signed)
 " [name] [address] [place] [address] Phone: [phone] Subjective:   LILLETTE Berwyn Posey, am serving as a scribe for Dr. Arthea Claudene.  I'm seeing this patient by the request  of:  Geofm Glade PARAS, MD  CC: Knee and low back pain  YEP:Dlagzrupcz  02/03/2024 Severe arthritic changes noted.  Could consider the possibility of another epidural and patient knows in 2 weeks if not better with that we will consider it.  In addition to this we discussed Toradol  and Depo-Medrol  injections.  Patient was given those today.  Has had difficulty with aspirin previously but has not had trouble with ibuprofen in the past.  Will see how patient responds.  Patient knows to seek medical attention if any worsening symptoms.  Follow-up again in 2 months     Patient given injection and tolerated the procedure well, discussed icing regimen and home exercises, increase activity slowly.  Discussed icing regimen.  Patient has done well with weight loss and we will continue to monitor.  Patient is not extremely optimistic with her knee replacement on the left side so does not want to repeat surgical intervention if they can help it.  Follow-up again in 6 to 8 weeks      Update 04/05/2024 ALLYSA GOVERNALE is a 79 y.o. female coming in with complaint of B knee and lumbar spine pain. Patient states that her knees and back have been doing well. Has some dull pain in her lumbar spine.   L knee is doing ok but she is having cramps in L lower leg and foot in the mornings. Occasionally she will have these symptoms during the day especially after being at rest.   Notices that pain is slowly coming back in R knee. R knee does give out intermittently.        Past Medical History:  Diagnosis Date   Abdominal hernia    Abnormal Pap smear    ALLERGIC RHINITIS 10/22/2006   Allergy    ANEMIA 12/18/2008   Arthritis    scoliosis moderate deg changes lumbar Xray 11/04/17    Asthma     ASYMPTOMATIC POSTMENOPAUSAL STATUS 11/22/2007   Back pain    CAD (coronary artery disease)    in LAD   CHF (congestive heart failure) (HCC)    Complication of anesthesia 2010   pt states she woke up during colonoscopy and was aware of losing control of breathing during mammoplasty   Degenerative arthritis    Diverticulosis    Dyslipidemia    Fibroid    Frequent headaches    GERD 07/20/2007   GOITER, MULTINODULAR 07/20/2007   Headache(784.0) 07/20/2007   HEARING LOSS 11/22/2007   Hiatal hernia    Hiatal hernia    large   History of chicken pox    Hx of colposcopy with cervical biopsy    HYPERCHOLESTEROLEMIA 01/13/2007   Hyperglycemia    HYPERTENSION 10/22/2006   Lung nodule    unchanged since 05/26/13    Migraines    NASH (nonalcoholic steatohepatitis)    Nocturnal hypoxemia 02/17/2013   Obesity    Obstructive sleep apnea    OSA on CPAP    per neurology   OSTEOARTHRITIS 10/22/2006   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   Pre-diabetes    Sedimentation rate elevation    Sleep apnea    on cpap   Urine incontinence    UTI (urinary tract infection)    Past Surgical History:  Procedure Laterality Date   BREAST SURGERY  1984   Breast reduction b/l    CATARACT EXTRACTION, BILATERAL     DEXA  08/2005   DILATION AND CURETTAGE OF UTERUS     ELECTROCARDIOGRAM  10/15/2006   ESOPHAGOGASTRODUODENOSCOPY  12/08/2005   FOOT SURGERY     hammertoe and bunion 05/2017    Stress Cardiolite  10/21/2005   sweat gland removal     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   Procedure: Left TOTAL KNEE ARTHROPLASTY;  Surgeon: Vernetta Lonni GRADE, MD;  Location: [facility] OR;  Service: Orthopedics;  Laterality: Left;   UMBILICAL HERNIA REPAIR N/A 04/25/2022   Procedure: OPEN INCARCERATED HERNIA REPAIR UMBILICAL WITH APPENDECTOMY;  Surgeon: Lyndel Deward PARAS, MD;  Location: [facility] OR;  Service: General;  Laterality: N/A;   WISDOM TOOTH EXTRACTION     Social History   Socioeconomic  History   Marital status: Married    Spouse name: Rodgers   Number of children: 2   Years of education: [employer] education level: Bachelor's degree (e.g., BA, AB, BS)  Occupational History   Occupation: Retired  Tobacco Use   Smoking status: Former    Current packs/day: 0.00    Average packs/day: 2.0 packs/day for 20.0 years (40.0 ttl pk-yrs)    Types: Cigarettes    Start date: 03/03/1959    Quit date: 03/03/1979    Years since quitting: 45.1    Passive exposure: Past   Smokeless tobacco: Never  Vaping Use   Vaping status: Never Used  Substance and Sexual Activity   Alcohol use: Yes    Comment: rare - TWICE A YR   Drug use: No   Sexual activity: Yes    Birth control/protection: Surgical  Other Topics Concern   Not on file  Social History Narrative   Patient is married Teretha).   Right Handed   Drinks 1 cup caffeine   Social Drivers of Health   Tobacco Use: Medium Risk (03/09/2024)   Patient History    Smoking Tobacco Use: Former    Smokeless Tobacco Use: Never    Passive Exposure: Past  Physicist, Medical Strain: Low Risk (11/15/2023)   Overall Financial Resource Strain (CARDIA)    Difficulty of Paying Living Expenses: Not hard at all  Food Insecurity: No Food Insecurity (11/15/2023)   Epic    Worried About Programme Researcher, Broadcasting/film/video in the Last Year: Never true    Ran Out of Food in the Last Year: Never true  Transportation Needs: No Transportation Needs (11/15/2023)   Epic    Lack of Transportation (Medical): No    Lack of Transportation (Non-Medical): No  Physical Activity: Insufficiently Active (11/15/2023)   Exercise Vital Sign    Days of Exercise per Week: 3 days    Minutes of Exercise per Session: 40 min  Stress: No Stress Concern Present (11/15/2023)   [employer] of Occupational Health - Occupational Stress Questionnaire    Feeling of Stress: Only a little  Social Connections: Socially Integrated (11/15/2023)   Social Connection and Isolation Panel     Frequency of Communication with Friends and Family: More than three times a week    Frequency of Social Gatherings with Friends and Family: Once a week    Attends Religious Services: More than 4 times per year    Active Member of [employer] or Organizations: Yes    Attends Banker Meetings: More than 4 times per year    Marital Status: Married  Depression (  PHQ2-9): Low Risk (05/27/2023)   Depression (PHQ2-9)    PHQ-2 Score: 0  Alcohol Screen: Low Risk (05/27/2023)   Alcohol Screen    Last Alcohol Screening Score (AUDIT): 0  Housing: Low Risk (11/15/2023)   Epic    Unable to Pay for Housing in the Last Year: No    Number of Times Moved in the Last Year: 0    Homeless in the Last Year: No  Utilities: Not At Risk (05/26/2022)   AHC Utilities    Threatened with loss of utilities: No  Health Literacy: Adequate Health Literacy (05/27/2023)   B1300 Health Literacy    Frequency of need for help with medical instructions: Never   Allergies[1] Family History  Problem Relation Age of Onset   Asthma Mother    Depression Mother    Bipolar disorder Mother    Dementia Mother    Arthritis Mother    Breast cancer Mother        primary   Colon cancer Mother        mets from breast   Cancer Mother        colon   Hypertension Father    Migraines Father    Cancer Maternal Aunt        ?   Heart disease Maternal Grandmother    Cancer Maternal Grandfather        stomach ?   Sleep apnea Neg Hx    Esophageal cancer Neg Hx    Stomach cancer Neg Hx    Rectal cancer Neg Hx     Current Outpatient Medications (Cardiovascular):    ezetimibe  (ZETIA ) 10 MG tablet, Take 1 tablet (10 mg total) by mouth daily.   furosemide  (LASIX ) 20 MG tablet, Take 1 tablet (20 mg total) by mouth daily. Take twice daily as needed for increased swelling   rosuvastatin  (CRESTOR ) 40 MG tablet, TAKE 1 TABLET DAILY AT NIGHT  Current Outpatient Medications (Respiratory):    cetirizine (ZYRTEC) 10 MG tablet, Take  10 mg by mouth daily.  Current Outpatient Medications (Analgesics):    acetaminophen  (TYLENOL ) 500 MG tablet, Take 1,000 mg by mouth 3 (three) times daily as needed for moderate pain.   naproxen sodium (ALEVE) 220 MG tablet, Take 220 mg by mouth as needed.  Current Outpatient Medications (Hematological):    clopidogrel  (PLAVIX ) 75 MG tablet, TAKE 1 TABLET DAILY   Cyanocobalamin  1000 MCG CAPS, Take 1 capsule by mouth daily.   Ferrous Sulfate  (IRON) 325 (65 FE) MG TABS, Take 1 tablet by mouth daily.  Current Outpatient Medications (Other):    Boswellia-Glucosamine-Vit D (OSTEO BI-FLEX ONE PER DAY PO), Take 1 tablet by mouth daily.   Calcium  Carb-Cholecalciferol (CHEWABLE CALCIUM /D3 PO), Take 1 tablet by mouth daily.   Coenzyme Q10 (CO Q 10 PO), Take 400 mg by mouth daily.   DULoxetine  (CYMBALTA ) 20 MG capsule, TAKE 1 CAPSULE BY MOUTH EVERY DAY   gabapentin  (NEURONTIN ) 100 MG capsule, TAKE 3 CAPSULES AT BEDTIME   KLOR-CON  M20 20 MEQ tablet, TAKE 1 TABLET DAILY   Multiple Vitamin (MULTIVITAMIN) tablet, Take 1 tablet by mouth daily.   Omega-3 Fatty Acids (FISH OIL PO), Take 2 g by mouth daily.   pantoprazole  (PROTONIX ) 20 MG tablet, TAKE 1 TABLET TWICE A DAY, 30 MINUTES BEFORE FOOD   polyethylene glycol (MIRALAX  / GLYCOLAX ) 17 g packet, Take 17 g by mouth every other day.   pyridoxine  (B-6) 200 MG tablet, Take 200 mg by mouth daily.   solifenacin  (VESICARE ) 10  MG tablet, Take 1 tablet (10 mg total) by mouth daily.   topiramate  (TOPAMAX ) 25 MG tablet, Take 3 tablets (75 mg total) by mouth at bedtime.   TURMERIC PO, Take 538 mg by mouth daily.   VEVYE 0.1 % SOLN, Apply 1 drop to eye 2 (two) times daily.   Vitamin D , Ergocalciferol , (DRISDOL ) 1.25 MG (50000 UNIT) CAPS capsule, TAKE EVERY 2 WEEKS   vitamin E  200 UNIT capsule, Take 200 Units by mouth daily.   Reviewed prior external information including notes and imaging from  primary care provider As well as notes that were available from  care everywhere and other healthcare systems.  Past medical history, social, surgical and family history all reviewed in electronic medical record.  No pertanent information unless stated regarding to the chief complaint.   Review of Systems:  No headache, visual changes, nausea, vomiting, diarrhea, constipation, dizziness, abdominal pain, skin rash, fevers, chills, night sweats, weight loss, swollen lymph nodes chest pain, shortness of breath, mood changes. POSITIVE muscle aches, joint swelling and bodyaches  Objective  Blood pressure 132/80, pulse 63, height 5' 4 (1.626 m), weight 211 lb (95.7 kg), SpO2 98%.   General: No apparent distress alert and oriented x3 mood and affect normal, dressed appropriately.  HEENT: Pupils equal, extraocular movements intact  Respiratory: Patient's speak in full sentences and does not appear short of breath  Cardiovascular: Trace edema Bilateral knees show patient does have the left knee replacement noted.  Patient does have still some crepitus noted of both knees.  Lateral tracking of the patella on the right knee. Low back exam shows mild loss of lordosis.  Some tightness noted but nothing severe. Right knee does have significant arthritic changes noted.  Crepitus noted.  Trace effusion noted.  Lacks the last 15 degrees of flexion.  After informed written and verbal consent, patient was seated on exam table. Right knee was prepped with alcohol swab and utilizing anterolateral approach, patient's right knee space was injected with 4:1  marcaine  0.5%: Kenalog  40mg /dL. Patient tolerated the procedure well without immediate complications.   Impression and Recommendations:    The above documentation has been reviewed and is accurate and complete Arthea CHRISTELLA Sharps, DO        [1]  Allergies Allergen Reactions   Aspirin Hives   Coconut (Cocos Nucifera) Hives   Lisinopril Cough   Metoprolol  Itching   Other     Latex paint, the smell causes hives. Not  allergic to latex gloves   General anesthesia - has had issues in past where anesthesia was not properly administered and caused side effects   Peanut-Containing Drug Products Hives, Itching and Other (See Comments)   Prednisone     Unknown reaction    Strawberry Extract Hives   Penicillins Rash   "

## 2024-04-05 ENCOUNTER — Encounter: Payer: Self-pay | Admitting: Family Medicine

## 2024-04-05 ENCOUNTER — Ambulatory Visit: Admitting: Family Medicine

## 2024-04-05 VITALS — BP 132/80 | HR 63 | Ht 64.0 in | Wt 211.0 lb

## 2024-04-05 DIAGNOSIS — M1711 Unilateral primary osteoarthritis, right knee: Secondary | ICD-10-CM | POA: Diagnosis not present

## 2024-04-05 DIAGNOSIS — M170 Bilateral primary osteoarthritis of knee: Secondary | ICD-10-CM

## 2024-04-05 NOTE — Assessment & Plan Note (Signed)
 Left knee is replaced.  Patient was to continue to see how she does with conservative therapy.  We discussed icing regimen and home exercises, discussed which activities to do and which ones to avoid.  Increase activity slowly.  Discussed icing regimen.  Follow-up again in 6 to 12 weeks.

## 2024-04-05 NOTE — Patient Instructions (Signed)
 Good to see you! See you again in 10 weeks Injection today in R knee

## 2024-04-06 ENCOUNTER — Ambulatory Visit (INDEPENDENT_AMBULATORY_CARE_PROVIDER_SITE_OTHER): Admitting: Family Medicine

## 2024-04-06 ENCOUNTER — Other Ambulatory Visit: Payer: Self-pay

## 2024-04-06 ENCOUNTER — Encounter (INDEPENDENT_AMBULATORY_CARE_PROVIDER_SITE_OTHER): Payer: Self-pay | Admitting: Family Medicine

## 2024-04-06 VITALS — BP 143/77 | HR 57 | Temp 97.7°F | Ht 64.0 in | Wt 209.0 lb

## 2024-04-06 DIAGNOSIS — Z6835 Body mass index (BMI) 35.0-35.9, adult: Secondary | ICD-10-CM | POA: Diagnosis not present

## 2024-04-06 DIAGNOSIS — I10 Essential (primary) hypertension: Secondary | ICD-10-CM

## 2024-04-06 DIAGNOSIS — E559 Vitamin D deficiency, unspecified: Secondary | ICD-10-CM | POA: Diagnosis not present

## 2024-04-06 DIAGNOSIS — E669 Obesity, unspecified: Secondary | ICD-10-CM | POA: Diagnosis not present

## 2024-04-06 MED ORDER — TOPIRAMATE 25 MG PO TABS: 75.0000 mg | ORAL_TABLET | Freq: Every day | ORAL | 3 refills | Status: AC

## 2024-04-06 NOTE — Progress Notes (Signed)
 "  Office: [phone]  /  Fax: [phone]  WEIGHT SUMMARY AND BIOMETRICS  Anthropometric Measurements Height: 5' 4 (1.626 m) Weight: 209 lb (94.8 kg) BMI (Calculated): 35.86 Weight at Last Visit: 201 lb Weight Lost Since Last Visit: 0 Weight Gained Since Last Visit: 8 lb Starting Weight: 268 lb Total Weight Loss (lbs): 59 lb (26.8 kg) Peak Weight: 268 lb   Body Composition  Body Fat %: 49.7 % Fat Mass (lbs): 104 lbs Muscle Mass (lbs): 99.8 lbs Visceral Fat Rating : 16   Other Clinical Data Fasting: no Labs: no Today's Visit #: 101 Starting Date: 12/23/16    Chief Complaint: OBESITY   History of Present Illness Heather [name] is a 79 year old female who presents for a follow-up on her obesity treatment plan and progress.  She has been following a category two eating plan, which involves keeping her calorie intake to 1200 per day and consuming at least 75 grams of protein. Adherence to this plan is about 50% of the time. She has gained eight pounds over the last three months, attributing this to the holiday season and [employer] cookie season.  Physical activity is occasional, including using her elliptical, shoveling snow, and doing YouTube exercise videos intermittently.  She has been working on diet, exercise, and weight loss to improve her blood pressure, which tends to be elevated in the office but better controlled at home. Home blood pressure shows no consistent pattern, fluctuating without clear triggers. She has increased her water intake, resulting in frequent urination.  She describes a challenging family dynamic where her efforts to maintain a healthy lifestyle are not fully supported by her family. She feels that her family is not in her 'cheering section' anymore and that her dietary changes have created a rift, as they miss the meals she used to prepare. She also notes that her family members are in worse shape than she is, which adds to the  tension.  She feels like an 'addict' living in an environment that is not supportive of her health goals. She struggles with cravings for sweets and feels deprived despite her efforts to avoid them. She acknowledges that her hard work has made a difference in her health, even if it is not always visible.  She experiences bowel movements up to three times a day, but recently noted an increase to five times, raising concerns about potential diarrhea. She has been incorporating beans into her diet without negative impact.      PHYSICAL EXAM:  Blood pressure (!) 143/77, pulse (!) 57, temperature 97.7 F (36.5 C), height 5' 4 (1.626 m), weight 209 lb (94.8 kg), SpO2 94%. Body mass index is 35.87 kg/m.  DIAGNOSTIC DATA REVIEWED BY MYSELF TODAY:  BMET    Component Value Date/Time   NA 142 11/17/2023 1420   NA 143 07/22/2023 1144   K 4.1 11/17/2023 1420   CL 108 11/17/2023 1420   CO2 26 11/17/2023 1420   GLUCOSE 84 11/17/2023 1420   BUN 13 11/17/2023 1420   BUN 14 07/22/2023 1144   CREATININE 0.74 11/17/2023 1420   CREATININE 0.80 12/07/2019 1246   CREATININE 0.81 12/28/2011 0853   CALCIUM  8.8 11/17/2023 1420   GFRNONAA >60 06/12/2023 1250   GFRNONAA >60 12/07/2019 1246   GFRAA 100 10/09/2019 1425   Lab Results  Component Value Date   HGBA1C 6.2 11/17/2023   HGBA1C 6.2 (H) 10/06/2006   Lab Results  Component Value Date   INSULIN  7.3 07/22/2023  INSULIN  20.9 12/23/2016   Lab Results  Component Value Date   TSH 0.76 11/17/2023   CBC    Component Value Date/Time   WBC 3.3 (L) 11/17/2023 1420   RBC 3.91 11/17/2023 1420   HGB 12.1 11/17/2023 1420   HGB 12.0 04/14/2021 1140   HCT 36.9 11/17/2023 1420   HCT 38.3 04/14/2021 1140   PLT 145.0 (L) 11/17/2023 1420   PLT 238 04/14/2021 1140   MCV 94.2 11/17/2023 1420   MCV 94 04/14/2021 1140   MCH 31.7 06/12/2023 1250   MCHC 32.9 11/17/2023 1420   RDW 15.5 11/17/2023 1420   RDW 13.8 04/14/2021 1140   Iron Studies     Component Value Date/Time   IRON 32 (L) 11/17/2023 1420   IRON 42 04/14/2021 1140   TIBC 203.0 (L) 11/17/2023 1420   TIBC 231 (L) 04/14/2021 1140   FERRITIN 220.2 11/17/2023 1420   FERRITIN 338 (H) 04/14/2021 1140   IRONPCTSAT 15.8 (L) 11/17/2023 1420   IRONPCTSAT 18 04/14/2021 1140   IRONPCTSAT 17 04/26/2018 1602   Lipid Panel     Component Value Date/Time   CHOL 128 11/17/2023 1420   CHOL 149 11/25/2020 1131   TRIG 48.0 11/17/2023 1420   HDL 56.80 11/17/2023 1420   HDL 62 11/25/2020 1131   CHOLHDL 2 11/17/2023 1420   VLDL 9.6 11/17/2023 1420   LDLCALC 61 11/17/2023 1420   LDLCALC 73 11/25/2020 1131   Hepatic Function Panel     Component Value Date/Time   PROT 7.1 11/17/2023 1420   PROT 7.1 07/22/2023 1144   ALBUMIN 3.7 11/17/2023 1420   ALBUMIN 4.1 07/22/2023 1144   AST 23 11/17/2023 1420   AST 19 12/07/2019 1246   ALT 19 11/17/2023 1420   ALT 20 12/07/2019 1246   ALKPHOS 45 11/17/2023 1420   BILITOT 0.5 11/17/2023 1420   BILITOT 0.2 07/22/2023 1144   BILITOT 0.3 12/07/2019 1246   BILIDIR 0.1 02/15/2015 1010   IBILI 0.2 12/28/2011 0853      Component Value Date/Time   TSH 0.76 11/17/2023 1420   Nutritional Lab Results  Component Value Date   VD25OH 39.40 11/17/2023   VD25OH 83.2 07/22/2023   VD25OH 89.5 10/28/2022     Assessment and Plan Assessment & Plan Obesity Weight gain of 8 pounds over the last three months, attributed to holiday periods and dietary challenges. She follows a category two eating plan and journaling 50% of the time, with occasional use of an elliptical and intermittent exercise videos. Family dynamics and lack of support contribute to challenges in maintaining dietary changes. She is exploring alternative weight loss methods, including a pink gelatin product, which is not recommended due to lack of evidence and potential misinformation. - Continue category two eating plan and journaling. - Discussed family support strategies to  improve dietary adherence. - Advised against using unproven weight loss products like pink gelatin.  Vitamin D  deficiency Managed with ergocalciferol  50,000 IU every two weeks. She reports having enough supply for the next month. - Continue ergocalciferol  50,000 IU every two weeks.  Elevated blood pressure with HTN Blood pressure readings are elevated in the office but better controlled at home. She reports variability in blood pressure readings without a clear pattern. Hydration status may be a contributing factor. - Continue monitoring blood pressure at home. - Ensure adequate hydration. - Continue diet, exercise and weight loss as discussed today as an important part of the treatment plan       Patients who  are on anti-obesity medications are counseled on the importance of maintaining healthy lifestyle habits, including balanced nutrition, regular physical activity, and behavioral modifications,  Medication is an adjunct to, not a replacement for, lifestyle changes and that the long-term success and weight maintenance depend on continued adherence to these strategies.   Angeline was informed of the importance of frequent follow up visits to maximize her success with intensive lifestyle modifications for her obesity and obesity related health conditions as recommended by USPSTF and CMS guidelines  Louann Penton, MD   "

## 2024-04-25 ENCOUNTER — Ambulatory Visit: Admitting: Podiatry

## 2024-05-04 ENCOUNTER — Ambulatory Visit (INDEPENDENT_AMBULATORY_CARE_PROVIDER_SITE_OTHER): Admitting: Family Medicine

## 2024-05-17 ENCOUNTER — Ambulatory Visit: Admitting: Internal Medicine

## 2024-06-01 ENCOUNTER — Encounter: Admitting: Internal Medicine

## 2024-06-01 ENCOUNTER — Ambulatory Visit (INDEPENDENT_AMBULATORY_CARE_PROVIDER_SITE_OTHER): Admitting: Family Medicine

## 2024-06-01 ENCOUNTER — Ambulatory Visit

## 2024-06-02 ENCOUNTER — Encounter: Admitting: Internal Medicine

## 2024-06-02 ENCOUNTER — Ambulatory Visit

## 2024-06-15 ENCOUNTER — Ambulatory Visit: Admitting: Family Medicine

## 2024-06-26 ENCOUNTER — Encounter: Admitting: Internal Medicine

## 2024-07-05 ENCOUNTER — Encounter: Admitting: Internal Medicine

## 2024-08-29 ENCOUNTER — Ambulatory Visit

## 2024-12-26 ENCOUNTER — Ambulatory Visit: Admitting: Neurology
